# Patient Record
Sex: Female | Born: 1970 | Race: White | Hispanic: No | Marital: Single
Health system: Northeastern US, Academic
[De-identification: ages and names within clinical notes are randomized; demographics above are authoritative.]

## PROBLEM LIST (undated history)

## (undated) ENCOUNTER — Inpatient Hospital Stay: Admission: RE | Payer: No Typology Code available for payment source | Source: Ambulatory Visit

## (undated) DIAGNOSIS — J45909 Unspecified asthma, uncomplicated: Secondary | ICD-10-CM

## (undated) DIAGNOSIS — F32A Depression, unspecified: Secondary | ICD-10-CM

## (undated) DIAGNOSIS — R569 Unspecified convulsions: Secondary | ICD-10-CM

## (undated) DIAGNOSIS — F259 Schizoaffective disorder, unspecified: Secondary | ICD-10-CM

## (undated) DIAGNOSIS — F329 Major depressive disorder, single episode, unspecified: Secondary | ICD-10-CM

## (undated) DIAGNOSIS — F419 Anxiety disorder, unspecified: Secondary | ICD-10-CM

## (undated) DIAGNOSIS — F250 Schizoaffective disorder, bipolar type: Secondary | ICD-10-CM

## (undated) DIAGNOSIS — K2970 Gastritis, unspecified, without bleeding: Secondary | ICD-10-CM

## (undated) DIAGNOSIS — I5022 Chronic systolic (congestive) heart failure: Secondary | ICD-10-CM

## (undated) DIAGNOSIS — F319 Bipolar disorder, unspecified: Secondary | ICD-10-CM

## (undated) DIAGNOSIS — F1910 Other psychoactive substance abuse, uncomplicated: Secondary | ICD-10-CM

## (undated) HISTORY — PX: TUMOR REMOVAL: SHX12

## (undated) HISTORY — PX: ABDOMINAL HYSTERECTOMY: SHX81

---

## 1993-01-19 DIAGNOSIS — B1920 Unspecified viral hepatitis C without hepatic coma: Secondary | ICD-10-CM | POA: Insufficient documentation

## 2000-10-13 ENCOUNTER — Ambulatory Visit: Payer: Self-pay | Admitting: Obstetrics & Gynecology

## 2009-06-11 ENCOUNTER — Encounter (HOSPITAL_BASED_OUTPATIENT_CLINIC_OR_DEPARTMENT_OTHER): Payer: Self-pay

## 2009-06-11 LAB — CBC, PLATELET & DIFFERENTIAL
BASOPHIL %: 0.5 % (ref 0.0–2.0)
EOSINOPHIL %: 1.3 % (ref 0.0–7.0)
HEMATOCRIT: 45.5 % (ref 36.0–48.0)
HEMOGLOBIN: 15.8 g/dl (ref 12.0–16.0)
LYMPHOCYTE %: 29.8 % (ref 13.0–39.0)
MEAN CORP HGB CONC: 34.8 g/dl (ref 32.0–36.0)
MEAN CORPUSCULAR HGB: 34.6 pg — ABNORMAL HIGH (ref 27.0–33.0)
MEAN CORPUSCULAR VOL: 99.4 fl (ref 80.0–100.0)
MEAN PLATELET VOLUME: 11.1 fl — ABNORMAL HIGH (ref 6.4–10.8)
MONOCYTE %: 5.7 % (ref 1.0–12.0)
NEUTROPHIL %: 62.7 % (ref 46.0–79.0)
PLATELET COUNT: 204 10*3/uL (ref 150–400)
RBC DISTRIBUTION WIDTH: 14.4 % — ABNORMAL HIGH (ref 11.5–14.3)
RED BLOOD CELL COUNT: 4.58 M/uL (ref 4.50–5.10)
WHITE BLOOD CELL COUNT: 10 10*3/uL (ref 4.0–10.8)

## 2009-06-11 LAB — URINALYSIS
BILIRUBIN, URINE: NEGATIVE
GLUCOSE, URINE: NEGATIVE MG/DL
KETONE, URINE: NEGATIVE MG/DL
LEUKOCYTE ESTERASE: NEGATIVE
NITRITE, URINE: NEGATIVE
OCCULT BLOOD, URINE: NEGATIVE
PH URINE: 6 (ref 5.0–8.0)
PROTEIN, URINE: NEGATIVE MG/DL
SPECIFIC GRAVITY URINE: 1.02 (ref 1.003–1.035)

## 2009-06-11 LAB — COMPREHENSIVE METABOLIC PANEL
ALANINE AMINOTRANSFERASE: 42 IU/L — ABNORMAL HIGH (ref 7–35)
ALBUMIN: 3.6 g/dl (ref 3.4–4.8)
ALKALINE PHOSPHATASE: 93 IU/L (ref 25–106)
ANION GAP: 12 mmol/L (ref 2–25)
ASPARTATE AMINOTRANSFERASE: 44 IU/L — ABNORMAL HIGH (ref 8–34)
BILIRUBIN TOTAL: 0.6 mg/dl (ref 0.2–1.1)
BUN (UREA NITROGEN): 6 mg/dl (ref 6–20)
CALCIUM: 9.1 mg/dl (ref 8.6–10.3)
CARBON DIOXIDE: 28 mmol/L (ref 22–32)
CHLORIDE: 103 mmol/L (ref 101–111)
CREATININE: 0.6 mg/dl (ref 0.4–1.2)
ESTIMATED GLOMERULAR FILT RATE: >60 mL/min (ref >60)
Glucose Random: 94 mg/dl (ref 74–160)
POTASSIUM: 3.6 mmol/L (ref 3.5–5.1)
SODIUM: 143 mmol/L (ref 135–144)
TOTAL PROTEIN: 6.7 g/dl (ref 5.9–7.5)

## 2009-06-11 LAB — URINE DRUG SCREEN 7 DRUGS
AMPHETAMINES URINE: NEGATIVE
BARBITURATES URINE: NEGATIVE
BENZODIAZEPINES URINE: NEGATIVE
CANNABINOIDS URINE: POSITIVE — AB
COCAINE METABOLITES URINE: POSITIVE — AB
ETHANOL URINE: POSITIVE — AB
OPIATES URINE: NEGATIVE

## 2009-06-11 LAB — SERUM DRUG SCREEN 6 DRUGS
ACETAMINOPHEN: <10 ug/ml — ABNORMAL LOW (ref 10–30)
BARBITURATE: NEGATIVE
BENZODIAZEPINE: NEGATIVE
ETHANOL: 14 mg/dl — ABNORMAL HIGH (ref <10)
SALICYLATE: <4 mg/dl — ABNORMAL LOW (ref 4–29)
TRICYCLIC ANTIDEPRESSANTS: NEGATIVE

## 2009-06-11 LAB — HCG QUALITATIVE URINE: HCG QUALITATIVE URINE: NEGATIVE

## 2009-06-11 LAB — HOLD RED TOP TUBE

## 2009-06-11 LAB — HOLD BLUE TOP TUBE

## 2009-06-11 NOTE — ED Notes (Signed)
[place] on curb with police requesting ETOH detox. Also uses [facility] and smokes cocaine denies chest pain.  Positive for SI no plan.  No signs trauma.  Cooperative at pres and on S/W

## 2009-06-12 ENCOUNTER — Inpatient Hospital Stay (HOSPITAL_BASED_OUTPATIENT_CLINIC_OR_DEPARTMENT_OTHER)
Admission: RE | Admit: 2009-06-12 | Disposition: A | Discharge status: Home / Self Care | Payer: Self-pay | Source: Emergency Department | Attending: Emergency Medicine | Admitting: Emergency Medicine

## 2009-06-12 HISTORY — DX: Bipolar disorder, unspecified: F31.9

## 2009-06-12 HISTORY — DX: Other psychoactive substance abuse, uncomplicated: F19.10

## 2009-06-12 HISTORY — DX: Depression, unspecified: F32.A

## 2009-06-12 HISTORY — DX: Major depressive disorder, single episode, unspecified: F32.9

## 2009-06-12 HISTORY — DX: Schizoaffective disorder, unspecified: F25.9

## 2009-06-12 LAB — PSYCH ASSESSMENT

## 2009-06-14 LAB — URINALYSIS
BILIRUBIN, URINE: NEGATIVE
GLUCOSE, URINE: NEGATIVE MG/DL
KETONE, URINE: NEGATIVE MG/DL
LEUKOCYTE ESTERASE: NEGATIVE
NITRITE, URINE: NEGATIVE
OCCULT BLOOD, URINE: NEGATIVE
PH URINE: 7 (ref 5.0–8.0)
PROTEIN, URINE: NEGATIVE MG/DL
SPECIFIC GRAVITY URINE: 1.004 (ref 1.003–1.035)

## 2009-06-16 LAB — URINE CULTURE/COLONY COUNT

## 2009-06-16 LAB — EMERGENCY ROOM NOTE

## 2009-06-16 LAB — PSYCH DISCHARGE SUMMARY

## 2009-08-21 LAB — EMERGENCY ROOM NOTE

## 2011-02-07 DIAGNOSIS — J449 Chronic obstructive pulmonary disease, unspecified: Secondary | ICD-10-CM | POA: Diagnosis present

## 2011-05-31 ENCOUNTER — Encounter (HOSPITAL_BASED_OUTPATIENT_CLINIC_OR_DEPARTMENT_OTHER): Payer: Self-pay | Admitting: Emergency Medicine

## 2011-05-31 LAB — SERUM DRUG SCREEN 6 DRUGS
ACETAMINOPHEN: <10 ug/ml — ABNORMAL LOW (ref 10–30)
BARBITURATE: NEGATIVE
BENZODIAZEPINE: NEGATIVE
ETHANOL: 131 mg/dl — ABNORMAL HIGH (ref <10)
SALICYLATE: <4 mg/dl — ABNORMAL LOW (ref 4–29)
TRICYCLIC ANTIDEPRESSANTS: NEGATIVE

## 2011-05-31 LAB — COMPREHENSIVE METABOLIC PANEL
ALANINE AMINOTRANSFERASE: 31 IU/L (ref 7–35)
ALANINE AMINOTRANSFERASE: 31 IU/L (ref 7–35)
ALBUMIN: 3.2 g/dl — ABNORMAL LOW (ref 3.4–4.8)
ALBUMIN: 3.2 g/dl — ABNORMAL LOW (ref 3.4–4.8)
ALKALINE PHOSPHATASE: 114 IU/L — ABNORMAL HIGH (ref 25–106)
ALKALINE PHOSPHATASE: 109 IU/L — ABNORMAL HIGH (ref 25–106)
ANION GAP: 13 mmol/L (ref 2–25)
ANION GAP: 10 mmol/L (ref 2–25)
ASPARTATE AMINOTRANSFERASE: 34 IU/L (ref 8–34)
ASPARTATE AMINOTRANSFERASE: 32 IU/L (ref 8–34)
BILIRUBIN TOTAL: 0.4 mg/dl (ref 0.2–1.1)
BILIRUBIN TOTAL: 0.3 mg/dl (ref 0.2–1.1)
BUN (UREA NITROGEN): 4 mg/dl — ABNORMAL LOW (ref 6–20)
BUN (UREA NITROGEN): 4 mg/dl — ABNORMAL LOW (ref 6–20)
CALCIUM: 8.7 mg/dl (ref 8.6–10.3)
CALCIUM: 8.7 mg/dl (ref 8.6–10.3)
CARBON DIOXIDE: 23 mmol/L (ref 22–32)
CARBON DIOXIDE: 26 mmol/L (ref 22–32)
CHLORIDE: 104 mmol/L (ref 101–111)
CHLORIDE: 104 mmol/L (ref 101–111)
CREATININE: 0.6 mg/dl (ref 0.4–1.2)
CREATININE: 0.6 mg/dl (ref 0.4–1.2)
ESTIMATED GLOMERULAR FILT RATE: >60 mL/min (ref >60)
ESTIMATED GLOMERULAR FILT RATE: >60 mL/min (ref >60)
Glucose Random: 94 mg/dl (ref 74–160)
Glucose Random: 88 mg/dl (ref 74–160)
POTASSIUM: 2.9 mmol/L — ABNORMAL LOW (ref 3.5–5.1)
POTASSIUM: 3.1 mmol/L — ABNORMAL LOW (ref 3.5–5.1)
SODIUM: 140 mmol/L (ref 135–144)
SODIUM: 140 mmol/L (ref 135–144)
TOTAL PROTEIN: 6.2 g/dl (ref 5.9–7.5)
TOTAL PROTEIN: 6 g/dl (ref 5.9–7.5)

## 2011-05-31 LAB — CBC, PLATELET & DIFFERENTIAL
BASOPHIL %: 0.2 % (ref 0.0–2.0)
EOSINOPHIL %: 1.3 % (ref 0.0–7.0)
HEMATOCRIT: 43.9 % (ref 36.0–48.0)
HEMOGLOBIN: 14.7 g/dl (ref 12.0–16.0)
LYMPHOCYTE %: 43.2 % — ABNORMAL HIGH (ref 13.0–39.0)
MEAN CORP HGB CONC: 33.5 g/dl (ref 32.0–36.0)
MEAN CORPUSCULAR HGB: 34.7 pg — ABNORMAL HIGH (ref 27.0–33.0)
MEAN CORPUSCULAR VOL: 103.4 fl — ABNORMAL HIGH (ref 80.0–100.0)
MEAN PLATELET VOLUME: 10.6 fl (ref 6.4–10.8)
MONOCYTE %: 6.4 % (ref 1.0–12.0)
NEUTROPHIL %: 48.9 % (ref 46.0–79.0)
PLATELET COUNT: 206 10*3/uL (ref 150–400)
RBC DISTRIBUTION WIDTH: 15.2 % — ABNORMAL HIGH (ref 11.5–14.3)
RED BLOOD CELL COUNT: 4.24 M/uL — ABNORMAL LOW (ref 4.50–5.10)
WHITE BLOOD CELL COUNT: 9.1 10*3/uL (ref 4.0–10.8)

## 2011-05-31 LAB — URINE PREGNANCY TEST (POINT OF CARE): HCG QUALITATIVE URINE: NEGATIVE

## 2011-05-31 LAB — URINE DIP (POINT OF CARE)
BILIRUBIN, URINE: NEGATIVE
KETONE, URINE: 1.02 mg/dl
LEUKOCYTE ESTERASE: NEGATIVE
NITRITE, URINE: NEGATIVE
OCCULT BLOOD, URINE: NEGATIVE
PH URINE: 6 (ref 5.0–8.0)
SPECIFIC GRAVITY URINE: NEGATIVE (ref 1.003–1.030)
UROBILINOGEN URINE: 1 mg/dl (ref 0.2–1.0)

## 2011-05-31 LAB — D-DIMER PE/DVT, QUANTITATIVE: D-DIMER PE/DVT, QUANTITATIVE: 0.35 mg/L FEU (ref 0.00–0.49)

## 2011-05-31 LAB — TROPONIN I: TROPONIN I: 0.02 ng/mL (ref 0.00–0.04)

## 2011-05-31 LAB — HOLD RED TOP TUBE

## 2011-05-31 LAB — HOLD GREEN TOP TUBE

## 2011-05-31 LAB — HOLD PURPLE TOP TUBE

## 2011-05-31 LAB — XR CHEST 2 VIEWS

## 2011-05-31 MED ORDER — SODIUM CHLORIDE 0.9 % IV BOLUS
1000.00 mL | Freq: Once | INTRAVENOUS | Status: AC
Start: 2011-05-31 — End: 2011-05-31
  Administered 2011-05-31: 1000 mL via INTRAVENOUS

## 2011-05-31 MED ORDER — LET SOLUTION
5.00 mL | Freq: Once | Status: AC
Start: 2011-05-31 — End: 2011-05-31
  Administered 2011-05-31: 5 mL via TOPICAL

## 2011-05-31 MED ORDER — POTASSIUM CHLORIDE 20 MEQ OR TBCR
40.00 meq | EXTENDED_RELEASE_TABLET | Freq: Once | ORAL | Status: AC
Start: 2011-05-31 — End: 2011-05-31
  Administered 2011-05-31: 40 meq via ORAL
  Filled 2011-05-31: qty 2

## 2011-05-31 MED ORDER — LET SOLUTION
Status: AC
Start: 2011-05-31 — End: 2011-06-01
  Filled 2011-05-31: qty 5

## 2011-05-31 MED ORDER — ONDANSETRON HCL 4 MG/2ML IJ SOLN
4.00 mg | Freq: Once | INTRAMUSCULAR | Status: AC
Start: 2011-05-31 — End: 2011-05-31
  Administered 2011-05-31: 4 mg via INTRAVENOUS
  Filled 2011-05-31: qty 2

## 2011-05-31 MED ORDER — SULFAMETHOXAZOLE-TMP DS 800-160 MG PO TABS
1.00 | ORAL_TABLET | Freq: Once | ORAL | Status: AC
Start: 2011-05-31 — End: 2011-05-31
  Administered 2011-05-31: 1 via ORAL
  Filled 2011-05-31: qty 1

## 2011-05-31 NOTE — ED Nursing Notes (Signed)
1510- IV # 22 placed in the right hand on 3rd attempt in aseptic technique.  Labs drawn and sent.  12 lead ECG given to Dr. Rosetta Posner.    Jennell Corner, RN, CEN  Emergency Medicine
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
## 2011-05-31 NOTE — ED Provider Notes (Addendum)
The patient was seen by me and Dr. Max Fickle. ED nursing record was reviewed. Prior records as available electronically through the Epic record were reviewed.      HPI:    This 40 year old female patient, with significant psychotic history and history of chronic ETOH abuse, presents to the Emergency Department with chief complaint of "hearing voices" for a few days which tell her to "hurt herself" and "hurt others." Patient denies any plan and voices are non-specific. Patient also has pain in left arm x 3 days which started when she noticed a "red bump" near the elbow. Associated symptoms to arm pain include numbness, tingling, and weakness. Patient also complains of SOB x 4 days. SOB is intermittent and non-exertional. Associated with non-productive cough. Patient also complains of CP x 1 day described as sharp and localized to left chest wall. Pain is non-radiating, non exertional. No associated nausea, vomiting, diaphoresis. Patient also states she is "in withdrawal." She states that she has been vomiting and has "chronic diarrhea." She states she had "a little tp drink today." Denies fever or chills. No abdominal pain. No urinary complaints. No recent use of cocaine. Only other substance in addition to ETOH use is tobacco use and marijuana.      ROS: Pertinent positives were reviewed as per the HPI above. All other systems were reviewed and are negative.      Past Medical History/Problem list:    Past Medical History    Bipolar affective schizo    Schizo affective schizophrenia depress    Depression     Substance abuse        There is no problem list on file for this patient.              Past Surgical History: History reviewed.  No pertinent past surgical history.      Medications:   No current facility-administered medications on file prior to encounter.  No current outpatient prescriptions on file prior to encounter.      Social History:   Social History   Marital Status: Divorced  Spouse Name: N/A    Years of  Education: N/A  Number of Children: N/A     Occupational History  None on file     Social History Main Topics   Smoking status: Current Everyday Smoker  1.0 Packs/Day     Smokeless tobacco:     Alcohol Use: Yes    Drug Use: Yes     Special: Marijuana    Sexually Active: Yes  Partner(s): Female     Other Topics Concern   None on file     Social History Narrative   None on file           Allergies:  Review of Patient's Allergies indicates:   Bee                        Tegretol                Hives      Physical Exam:  BP 140/88   Pulse 106   Temp 98 F   Resp 18   SpO2 96%    GENERAL: No acute distress, non-toxic.   SKIN:  Erythematous, fluctuant mass with overlying scab over proximal forearm. No surrounding erythema or induration. Warm & Dry, no rash.  HEAD:  NCAT. Sclerae are anicteric and aninjected, oropharynx is clear with dry mucous membranes. PERRL.  LUNGS:  Clear to  auscultation bilaterally. No wheezes, rales, rhonchi.   HEART:  RRR.  No murmurs, rubs, or gallops.   ABDOMEN:  Soft, NTND.  No masses.  No involuntary guarding or rebound.   EXTREMITIES:  See skin. No obvious deformities.  CSM intact x 4.   .   NEUROLOGIC:  Neurologic exam grossly intact.  PSYCHIATRIC:  Appropriate for age, time of day, and situation        ED Course and Medical Decision-making:  This 40 year old female patient arrives to ED with multiple complaints as noted in HPI. Given fact that patient is hearing voices which tell her to hurt herself and others, [facility] immediately notified of this case. Labs ordered to include CBC, CMP, Urine tox, Serum tox, Urine dip and Urine HCG. Given that CP has been going for 1 day, EKG and troponin ordered, though ACS less likely. EKG showed NSR, rate 86, QT 404, QTc 483, no ST/T wave ischemic changes. Given SOB and cough, CXR ordered to look for possible pneumonia. CXR was negative and showed no acute cardiopulmonary process. Patient tachycardic at 106 upon arrival, thus D-dimer added on. If possible  will consider CTA to rule out PE, however this is less likely since patient has not been immobile and no calf pain. Erythematous, fluctuant mass distal to left elbow consistent with abscess, however patient was very anxious and due to safety concerns with administering lidocaine or using scalpel on this patient, LET solution applied to see if this would help in dissolving superficial scab and expressing any purulent material. LET solution applied and nothing occurred. This will be re-evaluated later. Only lab resulted is Troponin, which was WNL. Lab notified. Apparently labs hemolyzed but no one notified to reorder labs again. CBC, Serum drug screen, CMP, D-dimer pending. Patient refusing and becoming verbally aggressive to staff. RN to make another attempt at drawing blood. If this is not successful, Psych will be notified regarding this.       Condition: pending  Disposition: pending      Diagnosis/Diagnoses:  pending    Patient signed out to Dr. Max Fickle at 5:55pm    Sonda Rumble PA-C    Attending note  Seen with PA, agree with exam and evaluation    The patient is a 40 year old female who is coming in with complaints that she's been hearing voices recently as well as several medical complaints.  She states she's been having chest pain and shortness of breath, chest pain is nonradiating which is pleuritic.  She states chest is more for one day denies any use of cocaine.  No history similar chest pain in the past.  The patient also states that she has left elbow pain from a lesion that has been growing over the past several days.  She denies any drug ingestions falls or injuries to the area.  With regard to the voices, she states that the voices are telling her to hurt other people.  She denies any suicide ideation.  She denies a history of hearing these voices in the past, she states she this has just started over the last several days.  She also admits drinking alcohol, last drink this morning.  Denies any drug  use.  The patient denies any chest pain at this time Exam reveals a easily agitated female, does not appear in any acute distress.  Breath sounds equal bilaterally, no rales rhonchi or wheezing.  Chest wall is nontender.  Left elbow with a 3-4 centimeter area of induration with  a 1 centimeter area of fluctuance that does appear that the area has nearly come to a head, though no drainage at this time.  She has multiple medical complaints on top of her psychiatric complaints, she was placed on a safety watch.  We obtained labs including cardiac enzymes d-dimer and an electrocardiogram.  Her TIMI score is 0 which places her at low risk, as well as her electrocardiogram which is no acute injury pattern, no ST abnormalities or T-wave inversions.  The troponin on the labs were negative.  I do not feel that given these findings at the chest pain is of cardiac etiology.  The d-dimer also was negative, which makes PE unlikely as she does not have any risk factors as well.  With regard to the abscess, after a great deal of talking with the patient was able to calm her down to the point where I could inject lidocaine over the area and did I&D over the area of maximal fluctuance which produced a significant amount of pus drainage.  I proceeded to palpate around the area of induration in an attempt to break up any loculations.  The patient did not tolerate this procedure well, halfway through she began to flail and would not tolerate further intervention, she refused any further treatment or work on the area.  A dressing was subsequently applied.  I give the patient a dose of Bactrim to cover for the abscess given that I do not feel it was 100% successful I&D.  The rest of her labs returned, no significant abnormality to warrant medical intervention.  I discussed with psychiatry that she was medically cleared, and they are coming to evaluate the patient.  I will sign this patient out to Dr. Venetia Maxon

## 2011-05-31 NOTE — ED Notes (Signed)
Complains of nausea, vomited x2 MD aware.

## 2011-05-31 NOTE — ED Notes (Signed)
Ambulated to BR able to give urine sample.

## 2011-05-31 NOTE — ED Nursing Notes (Signed)
1735- Labs are hemolyzed again.      Jennell Corner, RN, CEN  Emergency Medicine

## 2011-05-31 NOTE — ED Notes (Signed)
Patient is resting comfortably. 

## 2011-05-31 NOTE — ED Triage Note (Signed)
I'm hearing voices telling me to hurt people.My chest hurts for one day since my arm fot infected.I want detox.Crying female with abscess to left upper forearm.

## 2011-05-31 NOTE — ED Notes (Signed)
Patient is resting comfortably. No further episodes of vomiting.

## 2011-06-01 ENCOUNTER — Inpatient Hospital Stay (HOSPITAL_BASED_OUTPATIENT_CLINIC_OR_DEPARTMENT_OTHER)
Admission: RE | Admit: 2011-06-01 | Disposition: A | Discharge status: Other Acute Inpatient Hospital | Payer: Self-pay | Source: Emergency Department | Attending: Psychiatry | Admitting: Psychiatry

## 2011-06-01 ENCOUNTER — Encounter (HOSPITAL_BASED_OUTPATIENT_CLINIC_OR_DEPARTMENT_OTHER): Payer: Self-pay | Admitting: Psychiatry

## 2011-06-01 LAB — EKG

## 2011-06-01 MED ORDER — QUETIAPINE FUMARATE 200 MG PO TABS
200.00 mg | ORAL_TABLET | Freq: Once | ORAL | Status: AC
Start: 2011-06-01 — End: 2011-06-01
  Administered 2011-06-01: 200 mg via ORAL
  Filled 2011-06-01 (×2): qty 1

## 2011-06-01 MED ORDER — NICOTINE 21 MG/24HR TD PT24
1.0000 | MEDICATED_PATCH | Freq: Every day | TRANSDERMAL | Status: DC
Start: 2011-06-01 — End: 2011-06-02
  Administered 2011-06-02: 1 via TRANSDERMAL
  Filled 2011-06-01: qty 1

## 2011-06-01 MED ORDER — HYDROXYZINE HCL 25 MG PO TABS
25.0000 mg | ORAL_TABLET | Freq: Two times a day (BID) | ORAL | Status: DC | PRN
Start: 2011-06-01 — End: 2011-06-02
  Administered 2011-06-02: 25 mg via ORAL
  Filled 2011-06-01 (×2): qty 1

## 2011-06-01 MED ORDER — SULFAMETHOXAZOLE-TMP DS 800-160 MG PO TABS
1.00 | ORAL_TABLET | Freq: Once | ORAL | Status: AC
Start: 2011-06-01 — End: 2011-06-01
  Administered 2011-06-01: 1 via ORAL
  Filled 2011-06-01: qty 1

## 2011-06-01 MED ORDER — OXAZEPAM 15 MG PO CAPS
15.0000 mg | ORAL_CAPSULE | ORAL | Status: DC
Start: 2011-06-02 — End: 2011-06-02
  Administered 2011-06-02 (×2): 15 mg via ORAL
  Filled 2011-06-01 (×6): qty 1

## 2011-06-01 MED ORDER — LEVETIRACETAM 500 MG PO TABS
500.0000 mg | ORAL_TABLET | Freq: Two times a day (BID) | ORAL | Status: DC
Start: 2011-06-01 — End: 2011-06-02
  Administered 2011-06-01 – 2011-06-02 (×2): 500 mg via ORAL
  Filled 2011-06-01 (×4): qty 1

## 2011-06-01 MED ORDER — OXAZEPAM 10 MG PO CAPS
10.0000 mg | ORAL_CAPSULE | Freq: Four times a day (QID) | ORAL | Status: DC
Start: 2011-06-04 — End: 2011-06-02

## 2011-06-01 MED ORDER — CHLORPROMAZINE HCL 100 MG PO TABS
100.0000 mg | ORAL_TABLET | ORAL | Status: DC | PRN
Start: 2011-06-01 — End: 2011-06-02
  Filled 2011-06-01 (×2): qty 1

## 2011-06-01 MED ORDER — BACITRACIN 500 UNIT/GM EX OINT
TOPICAL_OINTMENT | Freq: Every day | CUTANEOUS | Status: DC
Start: 2011-06-01 — End: 2011-06-02
  Administered 2011-06-01: 17:00:00 via TOPICAL
  Filled 2011-06-01: qty 30

## 2011-06-01 MED ORDER — OXAZEPAM 15 MG PO CAPS
15.0000 mg | ORAL_CAPSULE | Freq: Four times a day (QID) | ORAL | Status: DC
Start: 2011-06-03 — End: 2011-06-02
  Filled 2011-06-01: qty 1

## 2011-06-01 MED ORDER — OXAZEPAM 30 MG PO CAPS
30.0000 mg | ORAL_CAPSULE | ORAL | Status: AC
Start: 2011-06-01 — End: 2011-06-02
  Administered 2011-06-01 – 2011-06-02 (×3): 30 mg via ORAL
  Filled 2011-06-01 (×5): qty 1

## 2011-06-01 MED ORDER — LEVETIRACETAM 500 MG PO TABS
500.00 mg | ORAL_TABLET | Freq: Once | ORAL | Status: AC
Start: 2011-06-01 — End: 2011-06-01
  Administered 2011-06-01: 500 mg via ORAL
  Filled 2011-06-01: qty 1

## 2011-06-01 MED ORDER — ACETAMINOPHEN 325 MG PO TABS
650.00 mg | ORAL_TABLET | Freq: Once | ORAL | Status: AC
Start: 2011-06-01 — End: 2011-06-01
  Administered 2011-06-01: 650 mg via ORAL
  Filled 2011-06-01: qty 2

## 2011-06-01 MED ORDER — LOPERAMIDE HCL 2 MG PO CAPS
2.00 mg | ORAL_CAPSULE | Freq: Once | ORAL | Status: AC
Start: 2011-06-01 — End: 2011-06-01
  Administered 2011-06-01: 2 mg via ORAL
  Filled 2011-06-01: qty 1

## 2011-06-01 MED ORDER — NICOTINE 21 MG/24HR TD PT24
1.00 | MEDICATED_PATCH | Freq: Once | TRANSDERMAL | Status: AC
Start: 2011-06-01 — End: 2011-06-01
  Administered 2011-06-01: 1 via TRANSDERMAL
  Filled 2011-06-01: qty 1

## 2011-06-01 MED ORDER — LORAZEPAM 2 MG/ML IJ SOLN
2.00 mg | Freq: Once | INTRAMUSCULAR | Status: AC
Start: 2011-06-01 — End: 2011-06-01
  Administered 2011-06-01: 2 mg via INTRAVENOUS
  Filled 2011-06-01: qty 1

## 2011-06-01 MED ORDER — LORAZEPAM 1 MG PO TABS
2.00 mg | ORAL_TABLET | Freq: Once | ORAL | Status: AC
Start: 2011-06-01 — End: 2011-06-01
  Administered 2011-06-01: 2 mg via ORAL
  Filled 2011-06-01: qty 2

## 2011-06-01 MED ORDER — QUETIAPINE FUMARATE 100 MG PO TABS
200.00 mg | ORAL_TABLET | Freq: Once | ORAL | Status: AC
Start: 2011-06-01 — End: 2011-06-01
  Administered 2011-06-01: 200 mg via ORAL
  Filled 2011-06-01: qty 2

## 2011-06-01 MED ORDER — OXAZEPAM 15 MG PO CAPS
15.0000 mg | ORAL_CAPSULE | ORAL | Status: DC | PRN
Start: 2011-06-01 — End: 2011-06-02
  Filled 2011-06-01: qty 1

## 2011-06-01 NOTE — ED Notes (Signed)
VSS report called to [name] Bactrim DS/ Ativan 2mg  administerednurse to nurse complete

## 2011-06-01 NOTE — Progress Notes (Signed)
Pt refused orthostatic VS, only accepted VS while lying down. Initially refused meds, able to accept later.  Angry and irritable on approach. Ate half her meal. Sleeping soundly @ 7 pm.

## 2011-06-01 NOTE — H&P (Signed)
Patient's psychiatric and medical history including labs were reviewed.   Patient has been medically cleared by the ED, of note the patient will need daily wound checks and dressing changes along with topical bactrim as well as PO bactrim as ordered. Patient is currently afebrile, with tenderness to palpation over abscess area.

## 2011-06-01 NOTE — ED Notes (Signed)
Pt assessed by psychiatry.  Denies pain.  Resting comfortably

## 2011-06-01 NOTE — Progress Notes (Signed)
In bed, snoring loudly, all evening. + meds with repeated requests; afebrile. Dressing applied to left arm post med. Urine pending, blood work pending for AM. Angry, wants phone when awake.

## 2011-06-01 NOTE — PES NOTES (Signed)
06/01/11 0130  Per [facility] Pt was hospitalized at [facility] and discharged 04/3011. She also does have an Intensive Case Manager at [facility]  [phone].  [email or web address]

## 2011-06-01 NOTE — ED Provider Notes (Signed)
Addendum: I received signout on this patient from Dr. Max Fickle.  I reviewed the records and personally saw the patient.  She has been resting comfortably, she was seen by psychiatry and agree that she needs psychiatric admission, they will arrange bed placement for her.  She agrees with this plan.  At roughly 00:50 the patient noted that she felt somewhat tremulous, she did not have any seizure activity, but 2 milligrams of Ativan was given for withdrawal prophylaxis and also given that she does have a seizure disorder and it was still unsure what her antiepileptics are.  Psychiatry was shortly thereafter able to confirm that she is on Keppra 500 milligrams twice a day, so her evening dose was given here.  Regarding the management of her abscess was drained here, she will have daily wound checks and dressing changes with bacitracin apply topically once a day, she will continue Bactrim double strength one tablet by mouth every 12 hours for 7 days.    Lorenso Courier MD
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
## 2011-06-01 NOTE — Initial Assessments (Signed)
PSYCH ADMISSION NOTE - Blue Team    Identifying Data:   as noted in [name]- Patient is a 40 year old female with a history of alcohol abuse and dependence, mood d/o vs. Schizoaffective disorder by history, PTSD by history, seizure disorder, who presented initially to the MED ED complaining of AH telling her to harm others, and multiple physical complaints including left elbow pain from an abscess.   Source of Information:   Patient, MED ED staff, review of discharge summary from 2010   Chief Complaint:    As reported in [name]-"I've been hearing voices and I'm off all my meds"   History of Present Illness (include acute symptoms, precipitants, changes in functional status, and level of distress):   As noted initially in [name] - Patient is a 40 year old female with a history of alcohol abuse and dependence, mood d/o vs. Schizoaffective disorder by history, PTSD by history, seizure disorder who presented initially to the MED ED complaining of AH telling her to harm others, and multiple physical complaints including left elbow pain from an abscess. History is somewhat limited by the patient's discomfort at pain in her arm and reluctance to discuss some aspects of her history. Nonetheless, she states that she was discharged from a psyciatric hospital one week ago (she does not recall which). Immediately after discharge she relapsed into drinking alcohol, with ingestion of 1.5 liters of vodka daily at present. She also claims her psychiatric medications at discharge, including Seroquel, Thorazine, Vistaril and Keppra were stolen from her, and therefore she has been off all her medications. She has also been homeless and living on the streets.   Due to the above she has felt worsening depression, as well as had racing thoughts, and she cannot separate depressed or manic symptoms from each other. She is able to say that she has had worsening AH over the past week, of multiple voices telling her to hurt other people by cutting  their throats. She has no desire or intent to harm others, but is concerned about these thoughts. She denies any VH. She denies paranoia or IOR. She feels she needs to be in the hospital to recompensate and restart her medications. She has had a drainage of her left arm abscess and accepted antibiotics in the MED ED, with plans to continue after inpatient admission. She says she last suffered a seizure several days ago, but cannot be sure whether this was related to alcohol use or being off Keppra. She asks for Ativan for tremulousness while she awaits her disposition, saying that she has a history of both seizures and DTs.   On the unit 06/01/11 she is in bed and declines meeting other then limited bedside interaction.  She does not disagree with restarting medications and getting detoxification on the unit at present. Encouraged to meet with full team in AM tomorrow.  Active Medical Problems:   Left elbow abscess s/p incision and drainage, history of seizure disorder.  Patient seen in the medical ED and cleared for admission by Dr. Venetia Maxon, who has restarted the patient's Keppra, and provides the following instructions for further care:   Continue Keppra 500mg  PO BID   Bactrim DS 1 tablet PO Q12 hours   Daily dry dressing of left elbow with bacitracin ointment.  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 Schizoaffective disorder by history, PTSD by history, Alcohol abuse and dependence, polysubstance abuse by history   History of Psychiatric Hospitalizations (include dates and locations):  Multiple, including stated discharge 1 week ago from  hospital, last [facility] discharge Sept 2010   Current/Most Recent Outpatient Care: She cannot recall, and says she has no current treaters   Safety Concerns: Active HI and AH as noted above in HPI   Medication/Treatment Trials: the patient states that she gets her medications at [facility], and in [name] was confirmed the following current med list:   Seroquel 200mg  PO BID    Vistaril 25mg  PO BID PRN anxiety   Thorazine 100mg  PO Q4 hours PRN agitation   Keppra 500mg  PO BID    Per [facility] Pt was hospitalized at [facility] and discharged 05/19/11. She also does have an Intensive Case Manager at [facility] [phone].  Family History:   History reviewed. No pertinent family history.   Family History of Mental Illness or Substance Abuse:Yes, says she has long history of family illness which she cannot recall.   Substance Abuse:   Substance Abuse Screen   * Used chemicals (other than as prescribed) in the past 30 days?: No   History of Substance Abuse Related Symptoms: Blackouts;Chills;CP/palpitations;Delirium tremors;Elevated BP;Diaphoresis;Seizures;Tremulousness;Hallucinations   Alcohol   * Alcohol Use: Yes   Alcohol Amount: 1.5 liters of vodka   Alcohol Frequency: Daily   Alcohol Last Used: Day of presentation   Alcohol Longest Period of Sobriety: Unknown   Alcohol Most Recent Sobriety: Approximcately one year ago   History of Blackouts: Yes   History of Hallucinations: Yes     Trauma History:   Abuse/Trauma History: None     Pertinent Past Medical/Surgical History:   Past Medical History       Bipolar affective  schizo       Schizo affective schizophrenia  depress     Depression      Substance abuse     History reviewed. No pertinent past surgical history.   Allergies   Review of Patient's Allergies indicates:   Bee   Tegretol Hives   Pre-Admission Medications:   Prior to Admission Medications    Medication  Last Dose  Informant  Patient Reported?  Taking?    quetiapine (SEROQUEL) 200 MG tablet  Past Month   Yes  Yes    Take 200 mg by mouth 3 (three) times daily.    hydrOXYzine (VISTARIL) 50 MG capsule  Past Month   Yes  Yes    Take 50 mg by mouth every 4 (four) hours as needed.    chlorproMAZINE (THORAZINE) 100 MG tablet  Past Month   Yes  Yes    Take 100 mg by mouth 3 (three) times daily.    levetiracetam (KEPPRA) 500 MG tablet  Past Month   Yes  Yes    Take 500 mg by  mouth 2 (two) times daily.      CURRENT SCHEDULED MEDICATIONS:        lorazepam  2 mg Intravenous Once    levetiracetam  500 mg Oral Once    sulfamethoxazole-trimethoprim  1 tablet Oral Once    lorazepam  2 mg Oral Once    nicotine  1 patch Transdermal Once    acetaminophen  650 mg Oral Once    QUEtiapine  200 mg Oral Once    loperamide  2 mg Oral Once    levetiracetam  500 mg Oral BID    nicotine  1 patch Transdermal Daily    bacitracin   Topical Daily    QUEtiapine  200 mg Oral Once    oxazepam  30 mg Oral Q4H    oxazepam  15 mg Oral Q4H    oxazepam  15 mg Oral Q6H    oxazepam  10 mg Oral Q6H    sulfamethoxazole-trimethoprim  1 tablet Oral Once    potassium chloride  40 mEq Oral Once    ondansetron  4 mg Intravenous Once    sodium chloride  1,000 mL Intravenous Once       CURRENT PRN MEDICATIONS:       chlorproMAZINE 100 mg Q4H PRN   hydrOXYzine 25 mg BID PRN   oxazepam 15 mg Q2H PRN       Psychosocial History:   Psychosocial   Military: No   Copywriter, advertising for Someone: No   Providing self care at home?: No   Risk Assessment (as noted in [name]):   Suicide Risk Assessment   * Suicidal Ideation?: No   Suicide Attempt?: Yes   Suicide Attempt (Current): No   Suicide Attempt (History): Yes   Suicide Attempt Means (History): Has tried to overdose on her medications in the past   Family Suicide Attempt?: Yes   Description:: Grandfather commited suicide   Family Suicide (actual): Yes   Description:: Grandfather commited suicide   Violence Risk   * History of Violence?: (Unknown)   Current Attempt to Harm: No   Homicidal Ideation?: Yes   Details: AH telling her to hurt other people, slit her throat   Homicidal Ideation With Intent?: No   Access to Weapons: Denies   Self Destruction Behaviors: Denies   Self Inflicted Injury?: Denies   Aggression/Poor Impulse Control?: Yes   Aggression/Poor Impulse Control (Current): No   Aggression/Poor Impulse Control (History): Yes   Aggression/Poor Impulse Control  Description (History): Cannot be clear on details   Medical Conditions Restraint Risk: Yes (comment) (Has wound on arm for which he received I&D)   Abuse History Restraint Risk: No   Current Outpatient Treatment:   Current OP Treatment   OP Provider: None   Agency Involvement: Intensive Case Manager at [facility] [phone].  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 Schizoaffective disorder by history, r/o Substance-induced mood disorder, alcohol abuse and dependence   AXIS II:: Deferred   AXIS III:: Left arm abscess s/p drainage   AXIS IV:: economic problems;housing problems;problems related to social environment;problems with access to health care services;problems with primary support group   Axis V (GAF): 35   Axis V: Estimated best in past year: 50   Case Formulation   Assessment/Formulation (As noted in [name]):   Patient is a 40 year old female with a history of alcohol abuse and dependence, mood d/o vs. Schizoaffective disorder by history, PTSD by history, seizure disorder, who presented initially to the MED ED complaining of AH telling her to harm others, and multiple physical complaints including left elbow pain from an abscess. Her current presentation is concerning for a combination of mood symptoms which may well be related to her heavy alcohol abuse and dependence, or due to underlying psychiatric illness which may benefit from further diagnostic work in the context of sobriety. In addition, her psychotic symptoms are concerning and may be related to substance use or underlying illness. She does present a safety risk given her active AH telling her to harm other people, and given this safety concern in association with her mood, psychosis and substance-related symptoms she will benefit from inpatient level of care for safety, containment and management of her mental illness.  Treatment Recommendations/Rationale for Recommended Level of Care:   Inpatient level of care for safety,  containment and management of mental illness.  Continue Antibiotic for abcess  Supervised sharps; lace restriction  Serax Detox  Resume prior psychiatric medication  Continue safety evaluation        I have reviewed all above information with patient.

## 2011-06-01 NOTE — Progress Notes (Signed)
RN Admission Note:   40 Yo Homeless Woman (living on the Streets) h/o ETOH abuse/dependence, Mood D/O vs Schizoaffective D/O, PTSD by history, admitted for increase AH telling to harm others by cutting their throats. Pt has No   Presented to [facility] w/ multiple Physical complaints including left elbow pain from abcess. Has Medical h/o of Sx Disorder (on [place]). Pt Stopped all her prescribed medication (stating they were stolen on the streets).    Pt had a recent inpatient admission but relapsed on Etoh (1.5 liters of Vodka) shortly after discharge. Pts been drinking Daily & was placed on Serax Taper on Admission. Pt was Admitted to [facility] at 3pm, admitted on Sect 12, Refusing to answer any questions or accept any treatment. Pt appearing drowsy, likely from medication given in the [name]. Initially refusing Vital Signs & Serax but later agreed. Pt Rec'd Serax 30mg  po, Seroquel at 6:15p, given at the bedside. Vital Sign lying down 121/84 hr 93. Refused sitting & standing. Pt also declined Blood Draw (BMP, CBC)...& Urine sample for Analysis.

## 2011-06-01 NOTE — PES NOTES (Signed)
06/01/11 noon Pt T MedER very irritable, paranoid, demanding and c/o elbow pain . VS elevated. Given Seroquel 200mg  for racing thoughts, +AH ; Nicoderm Patch and Tylenol 650mg  for elbow pain.  She then c/o several bouts of diarrhea and given Imodium 2mg .  Will monitor; now seems calmer and having some pain relief.  ELyonsRNCS

## 2011-06-02 ENCOUNTER — Inpatient Hospital Stay (HOSPITAL_BASED_OUTPATIENT_CLINIC_OR_DEPARTMENT_OTHER)
Admission: RE | Admit: 2011-06-02 | Disposition: A | Discharge status: Other Acute Inpatient Hospital | Payer: Self-pay | Source: Emergency Department | Attending: Student in an Organized Health Care Education/Training Program | Admitting: Student in an Organized Health Care Education/Training Program

## 2011-06-02 DIAGNOSIS — R079 Chest pain, unspecified: Secondary | ICD-10-CM | POA: Insufficient documentation

## 2011-06-02 DIAGNOSIS — F99 Mental disorder, not otherwise specified: Secondary | ICD-10-CM | POA: Insufficient documentation

## 2011-06-02 DIAGNOSIS — R0609 Other forms of dyspnea: Secondary | ICD-10-CM | POA: Insufficient documentation

## 2011-06-02 DIAGNOSIS — R0602 Shortness of breath: Secondary | ICD-10-CM | POA: Insufficient documentation

## 2011-06-02 DIAGNOSIS — F10239 Alcohol dependence with withdrawal, unspecified: Secondary | ICD-10-CM

## 2011-06-02 DIAGNOSIS — F10939 Alcohol use, unspecified with withdrawal, unspecified: Secondary | ICD-10-CM | POA: Insufficient documentation

## 2011-06-02 LAB — CBC, PLATELET & DIFFERENTIAL
BASOPHIL %: 0.4 % (ref 0.0–2.0)
BASOPHIL %: 0.5 % (ref 0.0–2.0)
EOSINOPHIL %: 1.6 % (ref 0.0–7.0)
EOSINOPHIL %: 1.6 % (ref 0.0–7.0)
HEMATOCRIT: 49.7 % — ABNORMAL HIGH (ref 36.0–48.0)
HEMATOCRIT: 46.9 % (ref 36.0–48.0)
HEMOGLOBIN: 16.5 g/dl — ABNORMAL HIGH (ref 12.0–16.0)
HEMOGLOBIN: 15.5 g/dl (ref 12.0–16.0)
LYMPHOCYTE %: 32.7 % (ref 13.0–39.0)
LYMPHOCYTE %: 37 % (ref 13.0–39.0)
MEAN CORP HGB CONC: 33.2 g/dl (ref 32.0–36.0)
MEAN CORP HGB CONC: 33.1 g/dl (ref 32.0–36.0)
MEAN CORPUSCULAR HGB: 34.6 pg — ABNORMAL HIGH (ref 27.0–33.0)
MEAN CORPUSCULAR HGB: 34.5 pg — ABNORMAL HIGH (ref 27.0–33.0)
MEAN CORPUSCULAR VOL: 104.2 fl — ABNORMAL HIGH (ref 80.0–100.0)
MEAN CORPUSCULAR VOL: 104.4 fl — ABNORMAL HIGH (ref 80.0–100.0)
MEAN PLATELET VOLUME: 11.3 fl — ABNORMAL HIGH (ref 6.4–10.8)
MEAN PLATELET VOLUME: 10.6 fl (ref 6.4–10.8)
MONOCYTE %: 4.9 % (ref 1.0–12.0)
MONOCYTE %: 5 % (ref 1.0–12.0)
NEUTROPHIL %: 60.4 % (ref 46.0–79.0)
NEUTROPHIL %: 55.9 % (ref 46.0–79.0)
PLATELET COUNT: 211 10*3/uL (ref 150–400)
PLATELET COUNT: 194 10*3/uL (ref 150–400)
RBC DISTRIBUTION WIDTH: 15.4 % — ABNORMAL HIGH (ref 11.5–14.3)
RBC DISTRIBUTION WIDTH: 15.1 % — ABNORMAL HIGH (ref 11.5–14.3)
RED BLOOD CELL COUNT: 4.76 M/uL (ref 4.50–5.10)
RED BLOOD CELL COUNT: 4.49 M/uL — ABNORMAL LOW (ref 4.50–5.10)
WHITE BLOOD CELL COUNT: 8.2 10*3/uL (ref 4.0–10.8)
WHITE BLOOD CELL COUNT: 8.5 10*3/uL (ref 4.0–10.8)

## 2011-06-02 LAB — COMPREHENSIVE METABOLIC PANEL
ALANINE AMINOTRANSFERASE: 26 IU/L (ref 7–35)
ALBUMIN: 2.9 g/dl — ABNORMAL LOW (ref 3.4–4.8)
ALKALINE PHOSPHATASE: 104 IU/L (ref 25–106)
ANION GAP: 7 mmol/L (ref 3–11)
ASPARTATE AMINOTRANSFERASE: 30 IU/L (ref 8–34)
BILIRUBIN TOTAL: 0.5 mg/dl (ref 0.2–1.1)
BUN (UREA NITROGEN): 7 mg/dl (ref 6–20)
CALCIUM: 8.6 mg/dl (ref 8.6–10.3)
CARBON DIOXIDE: 28 mmol/L (ref 22–32)
CHLORIDE: 108 mmol/L (ref 101–111)
CREATININE: 0.8 mg/dl (ref 0.4–1.2)
ESTIMATED GLOMERULAR FILT RATE: >60 mL/min (ref >60)
Glucose Random: 101 mg/dl (ref 74–160)
POTASSIUM: 3.9 mmol/L (ref 3.5–5.1)
SODIUM: 143 mmol/L (ref 135–144)
TOTAL PROTEIN: 5.7 g/dl — ABNORMAL LOW (ref 5.9–7.5)

## 2011-06-02 LAB — TROPONIN I: TROPONIN I: <0.01 ng/mL (ref 0.00–0.04)

## 2011-06-02 LAB — PROTHROMBIN TIME
INR: <1 — CL (ref 2.0–3.5)
INR: 1 — ABNORMAL LOW (ref 2.0–3.5)
PROTHROMBIN TIME: 10.1 SECONDS (ref 9.5–11.5)
PROTHROMBIN TIME: 10.4 SECONDS (ref 9.5–11.5)

## 2011-06-02 LAB — APTT
APTT: 24.7 SECONDS (ref 23.7–33.3)
APTT: 25.4 SECONDS (ref 23.7–33.3)

## 2011-06-02 LAB — CTA CHEST W CONTRAST PE PROTOCOL

## 2011-06-02 MED ORDER — HEPARIN SODIUM (PORCINE) 5000 UNIT/ML IJ SOLN
5000.0000 [IU] | Freq: Three times a day (TID) | INTRAMUSCULAR | Status: DC
Start: 2011-06-02 — End: 2011-06-03
  Administered 2011-06-02 – 2011-06-03 (×2): 5000 [IU] via SUBCUTANEOUS
  Filled 2011-06-02 (×2): qty 1

## 2011-06-02 MED ORDER — QUETIAPINE FUMARATE 100 MG PO TABS
200.00 mg | ORAL_TABLET | Freq: Once | ORAL | Status: AC
Start: 2011-06-02 — End: 2011-06-02
  Administered 2011-06-02: 200 mg via ORAL
  Filled 2011-06-02: qty 2

## 2011-06-02 MED ORDER — FOLIC ACID 5 MG/ML IJ SOLN
1.0000 mg | Freq: Every day | INTRAMUSCULAR | Status: DC
Start: 2011-06-03 — End: 2011-06-03

## 2011-06-02 MED ORDER — LEVETIRACETAM 500 MG PO TABS
500.0000 mg | ORAL_TABLET | Freq: Two times a day (BID) | ORAL | Status: DC
Start: 2011-06-02 — End: 2011-06-03
  Administered 2011-06-02 – 2011-06-03 (×2): 500 mg via ORAL
  Filled 2011-06-02 (×2): qty 1

## 2011-06-02 MED ORDER — QUETIAPINE FUMARATE 200 MG PO TABS
200.0000 mg | ORAL_TABLET | Freq: Three times a day (TID) | ORAL | Status: DC
Start: 2011-06-02 — End: 2011-06-03
  Administered 2011-06-02 – 2011-06-03 (×3): 200 mg via ORAL
  Filled 2011-06-02 (×3): qty 1

## 2011-06-02 MED ORDER — FOLIC ACID 1 MG PO TABS
1.0000 mg | ORAL_TABLET | Freq: Every day | ORAL | Status: DC
Start: 2011-06-03 — End: 2011-06-03
  Administered 2011-06-03: 1 mg via ORAL
  Filled 2011-06-02: qty 1

## 2011-06-02 MED ORDER — IPRATROPIUM-ALBUTEROL 0.5-2.5 (3) MG/3ML IN SOLN
3.0000 mL | Freq: Four times a day (QID) | RESPIRATORY_TRACT | Status: DC
Start: 2011-06-02 — End: 2011-06-03
  Administered 2011-06-03 (×2): 3 mL via RESPIRATORY_TRACT
  Filled 2011-06-02 (×2): qty 3

## 2011-06-02 MED ORDER — ASPIRIN 81 MG PO CHEW
CHEWABLE_TABLET | ORAL | Status: DC
Start: 2011-06-02 — End: 2011-06-02
  Filled 2011-06-02: qty 1

## 2011-06-02 MED ORDER — THIAMINE HCL 100 MG/ML IJ SOLN
1000.0000 mL | INTRAVENOUS | Status: AC
Start: 2011-06-02 — End: 2011-06-03
  Administered 2011-06-02: 1000 mL via INTRAVENOUS
  Filled 2011-06-02: qty 1000

## 2011-06-02 MED ORDER — OXAZEPAM 30 MG PO CAPS
30.0000 mg | ORAL_CAPSULE | Freq: Once | ORAL | Status: AC
  Administered 2011-06-02: 30 mg via ORAL
  Filled 2011-06-02: qty 1

## 2011-06-02 MED ORDER — LORAZEPAM 2 MG/ML IJ SOLN
1.0000 mg | INTRAMUSCULAR | Status: DC | PRN
Start: 2011-06-02 — End: 2011-06-03
  Administered 2011-06-02 – 2011-06-03 (×2): 2 mg via INTRAVENOUS
  Filled 2011-06-02 (×2): qty 1

## 2011-06-02 MED ORDER — LORAZEPAM 1 MG PO TABS
2.0000 mg | ORAL_TABLET | Freq: Four times a day (QID) | ORAL | Status: DC | PRN
Start: 2011-06-02 — End: 2011-06-03

## 2011-06-02 MED ORDER — SODIUM CHLORIDE 0.9 % IV SOLN
1000.0000 mL | Freq: Once | INTRAVENOUS | Status: DC
Start: 2011-06-02 — End: 2011-06-02

## 2011-06-02 MED ORDER — NITROGLYCERIN 0.4 MG SL SUBL
SUBLINGUAL_TABLET | SUBLINGUAL | Status: DC
Start: 2011-06-02 — End: 2011-06-02
  Filled 2011-06-02: qty 1

## 2011-06-02 MED ORDER — IPRATROPIUM-ALBUTEROL 0.5-2.5 (3) MG/3ML IN SOLN
3.00 mL | Freq: Once | RESPIRATORY_TRACT | Status: AC
Start: 2011-06-02 — End: 2011-06-02
  Administered 2011-06-02: 3 mL via RESPIRATORY_TRACT
  Filled 2011-06-02: qty 3

## 2011-06-02 MED ORDER — LORAZEPAM 1 MG PO TABS
1.0000 mg | ORAL_TABLET | ORAL | Status: DC | PRN
Start: 2011-06-02 — End: 2011-06-03

## 2011-06-02 MED ORDER — ASPIRIN EC 325 MG PO TBEC
325.0000 mg | DELAYED_RELEASE_TABLET | Freq: Every day | ORAL | Status: DC
Start: 2011-06-03 — End: 2011-06-03
  Administered 2011-06-03: 325 mg via ORAL
  Filled 2011-06-02: qty 1

## 2011-06-02 MED ORDER — THIAMINE HCL 100 MG PO TABS
100.0000 mg | ORAL_TABLET | Freq: Every day | ORAL | Status: DC
Start: 2011-06-03 — End: 2011-06-03
  Administered 2011-06-03: 100 mg via ORAL
  Filled 2011-06-02: qty 1

## 2011-06-02 MED ORDER — INFLUENZA VIRUS VACCINE SPLIT IM SUSP
0.50 mL | Freq: Once | INTRAMUSCULAR | Status: AC
Start: 2011-06-02 — End: 2011-06-02
  Administered 2011-06-02: 0.5 mL via INTRAMUSCULAR
  Filled 2011-06-02: qty 1

## 2011-06-02 MED ORDER — CHLORPROMAZINE HCL 100 MG PO TABS
100.0000 mg | ORAL_TABLET | Freq: Three times a day (TID) | ORAL | Status: DC
Start: 2011-06-02 — End: 2011-06-03
  Administered 2011-06-02 – 2011-06-03 (×3): 100 mg via ORAL
  Filled 2011-06-02 (×3): qty 1

## 2011-06-02 MED ORDER — QUETIAPINE FUMARATE 200 MG PO TABS
200.0000 mg | ORAL_TABLET | Freq: Two times a day (BID) | ORAL | Status: DC
Start: 2011-06-02 — End: 2011-06-02
  Administered 2011-06-02: 200 mg via ORAL
  Filled 2011-06-02 (×3): qty 1

## 2011-06-02 MED ORDER — OXAZEPAM 30 MG PO CAPS
60.0000 mg | ORAL_CAPSULE | Freq: Once | ORAL | Status: AC
  Administered 2011-06-02: 60 mg via ORAL
  Filled 2011-06-02: qty 2

## 2011-06-02 MED ORDER — IOPAMIDOL 76 % IV SOLN
90.00 mL | Freq: Once | INTRAVENOUS | Status: AC
Start: 2011-06-02 — End: 2011-06-02
  Administered 2011-06-02: 90 mL via INTRAVENOUS

## 2011-06-02 MED ORDER — HYDROXYZINE HCL 25 MG PO TABS
50.0000 mg | ORAL_TABLET | ORAL | Status: DC | PRN
Start: 2011-06-02 — End: 2011-06-03

## 2011-06-02 MED ORDER — SODIUM CHLORIDE 0.9 % IV BOLUS
1000.00 mL | Freq: Once | INTRAVENOUS | Status: AC
Start: 2011-06-02 — End: 2011-06-02
  Administered 2011-06-02: 1000 mL via INTRAVENOUS

## 2011-06-02 NOTE — H&P (Addendum)
H&P NOTE     Chief Complaint:   Patient presents with:    Chest Pain - CHEST PAIN      History of Present Illness:  Pt was transferred from [facility] after a rapid response was called for pt experiencing new onset crushing chest pain to the ED and then to the floor.  Pt was admitted to psychiatry for [facility] suggesting harm to others and alcohol abuse. Her last drink was 2-3 days ago. While on [name] she was woken from sleep and she developed new onset crushing severe retrosternal chest pain with no radiation, associated nausea/ vomiting/ diaphoresis/ lightheadedness which lasted approx 20-30 mins and cannot  identifiy an inciting or relieving factor.  She states that when she had the chest pain it was difficult to take a deep breath. But denies cough or fever or sputum production. Denies ever having exertional chest pain in the past or GERD and cannot ever remember having similar pain in the past.  She was seen in the ED and CTA ruled out PE.  CT chest also showed incidental finding of few prominent left subclavian lymph nodes. No pulmonary or mediastinal abnormalities are seen except for a 3 mm pleural-based nodule in the left upper lobe posteriorly.    Review of Systems:  Systems reviewed and all systems negative except  General as in HPI    Patient Active Problem List:  There is no problem list on file for this patient.      Past Medical History:     Past Medical History    Bipolar affective schizo    Schizo affective schizophrenia depress    Depression     Substance abuse          Past Surgical History:   No past surgical history on file.    Medications prior to Admission:     Prescriptions prior to admission:  lorazepam (ATIVAN) 2 MG tablet Take 2 mg by mouth every 6 (six) hours as needed. Disp:  Rfl:    quetiapine (SEROQUEL) 200 MG tablet Take 200 mg by mouth 2 (two) times daily. Disp:  Rfl:    hydrOXYzine (VISTARIL) 50 MG capsule Take 50 mg by mouth every 4 (four) hours as needed. Disp:  Rfl:    chlorproMAZINE  (THORAZINE) 100 MG tablet Take 100 mg by mouth 3 (three) times daily. Disp:  Rfl:    levetiracetam (KEPPRA) 500 MG tablet Take 500 mg by mouth 2 (two) times daily. Disp:  Rfl:          Allergies:   Review of Patient's Allergies indicates:   Bee                        Tegretol                Hives    Social History:  Tobacco Use:   Smoking status: Current Everyday Smoker 1.0 Packs/Day       Alcohol:   Alcohol Use: Yes         Family History: No family history on file.    Physical Exam:  BP 185/91   Pulse 73   Temp(Src) 97.6 F (36.4 C) (Oral)   Resp 16   SpO2 91%  General appearance: alert, appears stated age, cooperative and morbidly obese  Lungs: clear to auscultation bilaterally  Heart:  regular rate and rhythm, S1, S2 normal, no murmur, click, rub or gallop  Abdomen: soft, non-tender; bowel sounds normal; no  masses,  no organomegaly  Extremities: left elbow tender and swelling dressed with surgical dressing, no evidence of compartment syndrome, radial pulses intact.    Intake/Output last 24hours (7a-7a):   I/O 24 Hrs:  In: 240 [P.O.:240]  Out: -     Vital Signs - Last 24 Hours:   BP: (102-185)/(71-99)   Temp:  [97.6 F (36.4 C)-98 F (36.7 C)]   Pulse:  [73-97]   Resp:  [16]   SpO2:  [91 %-100 %]     Vital Signs - Last 8 Hours:   BP: (102-185)/(71-91)   Temp:  [97.6 F (36.4 C)-98 F (36.7 C)]   Pulse:  [73-93]   Resp:  [16]   SpO2:  [91 %-100 %]     Recent Labs:  LFTs:   Component Value Date   AST 30 06/02/2011   ALT 26 06/02/2011   TBILI 0.5 06/02/2011   ALKPHOS 104 06/02/2011     CBC:   Component Value Date   WBC 8.5 06/02/2011   HGB 15.5 06/02/2011   HCT 46.9 06/02/2011   PLTA 194 06/02/2011   RBC 4.49* 06/02/2011     BMP:   Component Value Date   NA 143 06/02/2011   K 3.9 06/02/2011   CL 108 06/02/2011   CO2 28 06/02/2011   BUN 7 06/02/2011   CREAT 0.8 06/02/2011   GLUCOSER 101 06/02/2011   CA 8.6 06/02/2011         Component Value Date   PT 10.4 06/02/2011   INR 1.0* 06/02/2011   APTT 25.4 06/02/2011       Component Value  Date   TROPI < 0.01 06/02/2011       No results found for this basename: BNP      Micro:  None    Imaging:   CTA- no PE, CT chest also showed incidental finding of few prominent left subclavian lymph nodes. No pulmonary or mediastinal abnormalities are seen except for a 3 mm pleural-based nodule in the left upper lobe posteriorly.    Assessment/Plan:  Patient Active Hospital Problem List:  Chest pain (06/02/2011)  Retrosternal chest pressure in pt with history of morbid obesity and   multiple psychiatric illnesses. Functional chest pain vs ACS. ECG sinus tachycardia without ST/T changes  and first trop negative. PE ruled out by CTA.  -trend cardiac enzymes and ECG  - asa daily    Alcohol withdrawal (06/02/2011)  Last drink 3 days ago, tremulous now.  -CIWA protocol.  -thiamine , folate repletion  Psychiatric disorder (06/02/2011)  -stable and no SI/HI.  Continue prior to admission meds  SOB (shortness of breath) (06/02/2011)  One episode when associated with chest pain, no wheezing on current exam   however informed by ED pt had some expiratory wheezes and has 30 pack year   smoking history and is a current smoker.  -duo- nebs prn          Augustina Mood, MD  Pager Number: [phone]

## 2011-06-02 NOTE — ED Provider Notes (Signed)
I performed a history and physical examination of the patient and discussed his management with the PA. I reviewed the PA's note and agree with the documented finding and plan of care except as noted below.     40 year old female who was admitted to [facility] for psychiatric reasons who developed chest pain.  The unresponsiveness: She did receive nitroglycerin which improved her pain.  She also reports shortness of breath and has been fairly non-ambulatory since admission.  Given this we did perform CTA of the chest to rule out PE.  This is negative.  Electrocardiogram showed sinus rhythm without ischemic change. The patient will need to be ruled out for myocardial infarction.  Initial troponin is negative.  She is currently pain-free.  She also received aspirin by the rapid response team.  She was medicated with Serax for her alcohol dependence and seroquel for her psychiatric disease.     786.09CU Dyspnea  786.59T Chest pressure    Zuri Bradway C. Imogene Burn, MD

## 2011-06-02 NOTE — ED Notes (Signed)
Bed:18<BR> Expected date:<BR> Expected time:<BR> Means of arrival:<BR> Comments:<BR>

## 2011-06-02 NOTE — Progress Notes (Signed)
Pt came to team guarded irritable and demanding. Requesting to leave ASAP. No longer want help "in this shit hole"  Pt has been spending most of shift sleeping in room.  CI WA 6 refusing vitals at times.  Threatening to hurt some if they try to take her vitals. Pt denies SI/AH/VH/HI  However looks preoccupied.  Med compliant

## 2011-06-02 NOTE — ED Notes (Signed)
Pt's room assignment changed. Pt now going to room 612B. Called 6 [place], nurse unable to take report.

## 2011-06-02 NOTE — ED Notes (Signed)
Pt very agitated throwing cup of ice across room for being sent to ED feels she is going to loose her bed on [name]

## 2011-06-02 NOTE — Progress Notes (Signed)
PA note    Patient refuses examination of abscess. See ED note and recommendations for management. Please consult when patient is amenable. Would be glad to see patient.

## 2011-06-02 NOTE — Progress Notes (Signed)
PSYCH PROGRESS NOTE    Identifying Data:  Patient is a 40 year old female that was admitted on 06/01/2011       Legal Status: Sec 12    INTERVAL HISTORY:   Pt seen, chart reviewed, discussed with team. In rounds, pt irritable.  Stated she came into the hospital because she had been wanting to hurt people for about 1 week.  When asked anyone in particular, she said no.  She could not say whether there were any recent stressors. Additionally, pt upset that she was not started on seroquel earlier, "I'm not on any of my meds here!"  Seroquel 200 mg BID was started this morning.   Soon after, pt became angry, stated "I don't want to talk anymore!" slamming door behind her.       Allergies:  Review of Patient's Allergies indicates:   Bee                        Tegretol                Hives    Scheduled Meds:        QUEtiapine  200 mg Oral BID    sulfamethoxazole-trimethoprim  1 tablet Oral Once    lorazepam  2 mg Oral Once    nicotine  1 patch Transdermal Once    acetaminophen  650 mg Oral Once    QUEtiapine  200 mg Oral Once    loperamide  2 mg Oral Once    levetiracetam  500 mg Oral BID    nicotine  1 patch Transdermal Daily    bacitracin   Topical Daily    QUEtiapine  200 mg Oral Once    oxazepam  30 mg Oral Q4H    oxazepam  15 mg Oral Q4H    oxazepam  15 mg Oral Q6H    oxazepam  10 mg Oral Q6H         PRN Meds:       chlorproMAZINE 100 mg Q4H PRN   hydrOXYzine 25 mg BID PRN   oxazepam 15 mg Q2H PRN         VITALS:  BP 138/99   Pulse 97   Temp 98 F (36.7 C)   Resp 18   SpO2 96%      MENTAL STATUS EXAM:   Mental Status  General Appearance: Disheveled  Behavior: irritable  Level of Consciousness: alert  Orientation Level: Oriented x3  Attention/Concentration: Diminished  Mannerisms/Movements: No abnormal mannerisms/movements  Speech Rate: Halting  Speech Tone: Loud  Mood: upset  Affect: irritable, angry  Thought Process: Thought withdrawal  Thought Content: homicidal thoughts  Types of Homicidal  Thoughts: Reasons for not doing it  Memory: STM Impaired  Judgment: Poor  Insight: Poor  Dissociative Symptoms: None      Physical/Somatic Complaints  The patient lists: abscess left arm       MULTIAXIAL DIAGNOSIS  Diagnoses   AXIS I Comments: 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 Schizoaffective disorder by history, r/o Substance-induced mood disorder, alcohol abuse and dependence  AXIS II:: Deferred  AXIS III:: Left arm abscess s/p drainage  AXIS IV:: economic problems;housing problems;problems related to social environment;problems with access to health care services;problems with primary support group  Axis V (GAF): 35    Assessment/Formulation (As noted in [name]):   Patient is a 40 year old female with a history of alcohol abuse and dependence, mood d/o vs. Schizoaffective disorder by history,  PTSD by history, seizure disorder, who presented initially to the MED ED complaining of AH telling her to harm others, and multiple physical complaints including left elbow pain from an abscess. She had recently had discharge in early August from [place], and since then had relapsed on alcohol and had not been taking medication, and she stated that these were stolen from her.  There is concerning for a combination of mood symptoms which may well be related to her heavy alcohol abuse and dependence, In addition, her psychotic symptoms are concerning and may be related to substance use or underlying illness. She does present a safety risk given her active AH telling her to harm other people, and given this safety concern in association with her mood, psychosis and substance-related symptoms she will benefit from inpatient level of care for safety, containment and management of her mental illness.      PLAN:  Psychiatric:  Schizoaffective d/o:  --continue seroquel 200 mg BID     Substance:  --continue serax taper given recent intoxication    Safety:  --15 minute checks    Mood:  --atarax prn for anxiety    Medical:  --continue bacitracin for  abscess  --nicotine patch daily  --keppra 500 mg BID for hx of seizures    Dispo:  --coordinate with outside treaters  --continuing need for [facility]          Review with patient: Treatment plan reviewed with the patient.

## 2011-06-02 NOTE — Progress Notes (Signed)
Patient seen, chart reviewed, case discussed with team.  I agree with the assessment and plan as noted in psychiatry resident progress note.

## 2011-06-02 NOTE — ED Notes (Signed)
Pt returned from Ct no PE evidenced dispo to admit

## 2011-06-02 NOTE — ED Triage Note (Signed)
Pt was having 8/10 chest pressure while inpatient on [name] was given nitro 0.5mcg x 2 and unknown amount of aspirin EKG showed ST pain resolved after nitro cont to feel SOB Sat 94/sat

## 2011-06-02 NOTE — ED Provider Notes (Signed)
eMERGENCY dEPARTMENT eNCOUnter      I have reviewed the ED nursing notes and prior records. I have reviewed the patient's past medical history/problem list, allergies, social history and medication list.    This patient was seen with Emergency Department attending physician Dr.Paul Imogene Burn    CHIEF COMPLAINT  Patient presents with:    Chest Pain - CHEST PAIN      HPI  Cheryl Zimmerman is a 40 year old female presenting to the emergency department from [facility] after episode of sudden onset chest pressure and associated dyspnea.  The patient has been in patient over the past 3 days, admitted initially to medicine service for alcohol withdrawal.  Patient was then cleared and transferred from the medicine service to [facility].  Patient admitted that she has not really been eating and has been sleeping a lot.  Staff on [place] for a walk the patient around 5 PM at which point the patient developed sudden onset of chest pressure and dyspnea.  A code was called to evaluate the patient on [name] - the patient was then directly admitted to the medicine floor, but was accidentally brought by medicine residents to the emergency department for further evaluation.  Upon arrival in the emergency department the patient was alert and oriented x3.  She claims he had given her aspirin and 2 sublingual nitroglycerin during the initial evaluation on [name] which significantly improved her symptoms.  Patient still having mild dyspnea and chest pressure and was noted to have a room air oxygen saturation 94%.  Patient was not tachypneic.  Initial electrocardiogram in the emergency department did not reveal any acute ischemic changes.    PAST MEDICAL HISTORY      Past Medical History    Bipolar affective schizo    Schizo affective schizophrenia depress    Depression     Substance abuse          FAMILY HISTORY    No family history on file.    SOCIAL HISTORY    Social History    Marital Status: Divorced            Spouse  Name:                       Years of Education:                 Number of children:               Social History Main Topics    Smoking Status: Current Everyday Smoker         Packs/Day: 1     Years:         Alcohol Use: Yes             Drug Use: Yes                Special: Marijuana    Sexual Activity: Yes               Partners with: Female        SURGICAL HISTORY    No past surgical history on file.    CURRENT MEDICATIONS    1. LORAZEPAM 2 MG PO TABS  Route: Oral TKZ:SWFU 2 mg by mouth every 6 (six) hours as needed.  Dispense:  Refill:   2. QUETIAPINE FUMARATE 200 MG PO TABS  Route: Oral XNA:TFTD 200 mg by mouth 2 (two) times daily.  Dispense:  Refill:  3. HYDROXYZINE PAMOATE 50 MG PO CAPS  Route: Oral ZOX:WRUE 50 mg by mouth every 4 (four) hours as needed.  Dispense:  Refill:   4. CHLORPROMAZINE HCL 100 MG PO TABS  Route: Oral AVW:UJWJ 100 mg by mouth 3 (three) times daily.  Dispense:  Refill:   5. LEVETIRACETAM 500 MG PO TABS  Route: Oral XBJ:YNWG 500 mg by mouth 2 (two) times daily.  Dispense:  Refill:     ALLERGIES    Review of Patient's Allergies indicates:   Bee                        Tegretol                Hives    IMMUNIZATIONS    There is no immunization history on file for this patient.    REVIEW OF SYSTEMS    Pertinent positives were reviewed as per the HPI above. All other systems were reviewed and are negative. See HPI for further details    PHYSICAL EXAM    VITAL SIGNS: BP 102/71   Pulse 93   Temp 98 F   Resp 16   SpO2 100%     Constitutional:  Well developed, well nourished, no acute distress, non-toxic appearance, speaking full sentences  Eyes:  PERRL, conjunctiva pink and  Moist.  HEENT:  Atraumatic, external ears normal, nose normal, oropharynx moist, no pharyngeal exudates. Neck- normal range of motion, no tenderness, supple, no JVD  Respiratory:  No respiratory distress, no rales, faint expiratory wheezing ) the left  Cardiovascular:  Normal rate, normal rhythm, no murmurs, no gallops, no  rubs   GI: Obese, Soft, nondistended, normal bowel sounds, nontender, no organomegaly, no mass, no rebound, no guarding   GU:  No costovertebral angle tenderness   Musculoskeletal:  No edema, no tenderness, no deformities. Back- no tenderness, no peripheral edema  Integument:  Well hydrated, no rash   Lymphatic:  No lymphadenopathy noted   Neurologic:  Alert & oriented x 3, normal motor function, normal sensory function, no focal deficits noted   Psychiatric:  Speech and behavior appropriate     ED COURSE & MEDICAL DECISION MAKING    Pertinent Labs & Imaging studies reviewed.     Medication Orders Placed This Encounter  lorazepam (ATIVAN) 2 MG tablet  quetiapine (SEROQUEL) 200 MG tablet  sodium chloride 0.9 % IV bolus 1,000 mL  ipratropium-albuterol (DUO-NEB) 0.5-2.5 (3) MG/3ML nebulizer solution 3 mL  oxazepam (SERAX) capsule 60 mg  iopamidol (ISOVUE-370) 76 % injection 90 mL  Results for orders placed during the hospital encounter of 06/02/11 (from the past 24 hour(s))   CBC + PLT + AUTO DIFF    Collection Time    06/02/11  6:16 PM   Component Value Comment    WHITE BLOOD CELL 8.2      RED BLOOD CELL COUNT 4.76      HEMOGLOBIN 16.5 (*)     HEMATOCRIT 49.7 (*)     MEAN CORPUSCULAR VOL 104.2 (*)     MEAN CORPUSCULAR HGB 34.6 (*)     MEAN CORP HGB CONC 33.2      RBC DISTRIBUTION WIDTH 15.4 (*)     PLATELET COUNT 211      MEAN PLATELET VOLUME 11.3 (*)     NEUTROPHIL % 60.4      LYMPHOCYTE % 32.7      MONOCYTE % 4.9      EOSINOPHIL % 1.6  BASOPHIL % 0.4      Narrative:     Current Anticoagulant (57)->None  Current Anticoagulant->None   PROTHROMBIN TIME    Collection Time    06/02/11  6:16 PM   Component Value Comment    PROTHROMBIN TIME 10.1      INR < 1.0 (*)     Narrative:     Current Anticoagulant (57)->None  Current Anticoagulant->None   APTT    Collection Time    06/02/11  6:16 PM   Component Value Comment    APTT 24.7      Narrative:     Current Anticoagulant (57)->None  Current  Anticoagulant->None   COMPREHENSIVE METABOLIC PANEL    Collection Time    06/02/11  7:13 PM   Component Value Comment    SODIUM 143      POTASSIUM 3.9      CHLORIDE 108      CARBON DIOXIDE 28      ANION GAP 7      CALCIUM 8.6      Glucose Random 101      BLOOD UREA NITROGEN 7      TOTAL PROTEIN 5.7 (*)     ALBUMIN 2.9 (*)     BILIRUBIN TOTAL 0.5      ALKALINE PHOSPHATASE 104      ASPARTATE AMINOTRANSFERAS 30      CREATININE 0.8      ESTIMATED GLOMERULAR FILT RATE > 60      ALANINE AMINOTRANSFERASE 26     CBC + PLT + AUTO DIFF    Collection Time    06/02/11  7:13 PM   Component Value Comment    WHITE BLOOD CELL 8.5      RED BLOOD CELL COUNT 4.49 (*)     HEMOGLOBIN 15.5      HEMATOCRIT 46.9      MEAN CORPUSCULAR VOL 104.4 (*)     MEAN CORPUSCULAR HGB 34.5 (*)     MEAN CORP HGB CONC 33.1      RBC DISTRIBUTION WIDTH 15.1 (*)     PLATELET COUNT 194      MEAN PLATELET VOLUME 10.6      NEUTROPHIL % 55.9      LYMPHOCYTE % 37.0      MONOCYTE % 5.0      EOSINOPHIL % 1.6      BASOPHIL % 0.5     TROPONIN I    Collection Time    06/02/11  7:13 PM   Component Value Comment    TROPONIN I < 0.01     PROTHROMBIN TIME    Collection Time    06/02/11  7:15 PM   Component Value Comment    PROTHROMBIN TIME 10.4      INR 1.0 (*)     Narrative:     Current Anticoagulant (57)->None   APTT    Collection Time    06/02/11  7:15 PM   Component Value Comment    APTT 25.4      Narrative:     Current Anticoagulant (57)->None       CT CHEST W CONTRAST PE PROTOCOL:  Impression: No evidence of pulmonary embolus. There are a few  prominent left subclavian lymph nodes. No pulmonary or mediastinal  abnormalities are seen except for a 3 mm pleural-based nodule in the  left upper lobe posteriorly.    EKG-NSR, no acute ischemia.    CONDITION ON ADMISSION   Improved and Stable    ADMITTING  DIAGNOSIS  786.09CU Dyspnea  786.59T Chest pressure        Admitting Disposition: Stable and Improved  Admit to:   [facility]  Hand-off  Discussion with:   [facility]  Admission Type:  Observation  Service:   Telemetry  Team:    [employer] Team    Electronically signed by:   Blanchard Mane, PAC, 06/02/2011 8:02 PM  Dept.of Emergency Medicine  [facility]

## 2011-06-02 NOTE — ED Notes (Addendum)
Pt refuses to be on cardiac monitor. Pt very agitated at staff

## 2011-06-02 NOTE — ED Notes (Signed)
Pt transfered to 6 north on floor

## 2011-06-02 NOTE — Progress Notes (Signed)
Patient slept thru the noc. Remains agitated, asking for her belongings, especially her cell phone. Took 2 am Serax with fluid. Refused vital signs and Serax @ 6am stating she will not take meds or do vital signs until she gets her belongings.

## 2011-06-02 NOTE — ED Notes (Signed)
Neb x 1 serax 60mg  for W/D from ETOH

## 2011-06-02 NOTE — Progress Notes (Signed)
Case authorized x3 days of HLOC by [facility], auth # [id number].  Last covered day and date of next review 06/03/11 with with [place]/[facility] [phone], ext [phone].

## 2011-06-03 ENCOUNTER — Inpatient Hospital Stay (HOSPITAL_BASED_OUTPATIENT_CLINIC_OR_DEPARTMENT_OTHER)
Admit: 2011-06-03 | Disposition: A | Discharge status: Home / Self Care | Payer: Self-pay | Source: Ambulatory Visit | Attending: Psychiatry | Admitting: Psychiatry

## 2011-06-03 DIAGNOSIS — L02414 Cutaneous abscess of left upper limb: Secondary | ICD-10-CM | POA: Insufficient documentation

## 2011-06-03 LAB — EKG

## 2011-06-03 LAB — TROPONIN I
TROPONIN I: 0.01 ng/mL (ref 0.00–0.04)
TROPONIN I: 0.01 ng/mL (ref 0.00–0.04)

## 2011-06-03 MED ORDER — FOLIC ACID 1 MG PO TABS
1.00 mg | ORAL_TABLET | Freq: Every day | ORAL | Status: AC
Start: 2011-06-03 — End: 2012-06-02

## 2011-06-03 MED ORDER — AMOXICILLIN-POT CLAVULANATE 875-125 MG PO TABS
1.0000 | ORAL_TABLET | Freq: Two times a day (BID) | ORAL | Status: DC
Start: 2011-06-03 — End: 2011-06-06

## 2011-06-03 MED ORDER — THIAMINE HCL 100 MG PO TABS
100.0000 mg | ORAL_TABLET | Freq: Every day | ORAL | Status: DC
Start: 2011-06-03 — End: 2021-12-17

## 2011-06-03 MED ORDER — OXAZEPAM 15 MG PO CAPS
15.00 mg | ORAL_CAPSULE | Freq: Four times a day (QID) | ORAL | Status: AC
Start: 2011-06-03 — End: 2011-06-04
  Administered 2011-06-03 – 2011-06-04 (×3): 15 mg via ORAL
  Filled 2011-06-03 (×3): qty 1

## 2011-06-03 MED ORDER — AMOXICILLIN-POT CLAVULANATE 875-125 MG PO TABS
1.0000 | ORAL_TABLET | Freq: Two times a day (BID) | ORAL | Status: DC
Start: 2011-06-03 — End: 2011-06-03
  Administered 2011-06-03: 1 via ORAL
  Filled 2011-06-03: qty 1

## 2011-06-03 MED ORDER — CHLORPROMAZINE HCL 50 MG PO TABS
50.0000 mg | ORAL_TABLET | Freq: Three times a day (TID) | ORAL | Status: DC | PRN
Start: 2011-06-03 — End: 2011-06-06
  Administered 2011-06-06: 50 mg via ORAL
  Filled 2011-06-03: qty 1

## 2011-06-03 MED ORDER — OXAZEPAM 15 MG PO CAPS
15.0000 mg | ORAL_CAPSULE | ORAL | Status: DC | PRN
Start: 2011-06-03 — End: 2011-06-06
  Administered 2011-06-04: 15 mg via ORAL
  Filled 2011-06-03 (×2): qty 1

## 2011-06-03 MED ORDER — LEVETIRACETAM 500 MG PO TABS
500.0000 mg | ORAL_TABLET | Freq: Two times a day (BID) | ORAL | Status: DC
Start: 2011-06-03 — End: 2011-06-06
  Administered 2011-06-03 – 2011-06-06 (×6): 500 mg via ORAL
  Filled 2011-06-03 (×6): qty 1

## 2011-06-03 MED ORDER — OXAZEPAM 10 MG PO CAPS
10.00 mg | ORAL_CAPSULE | Freq: Four times a day (QID) | ORAL | Status: AC
Start: 2011-06-04 — End: 2011-06-05
  Administered 2011-06-04 – 2011-06-05 (×3): 10 mg via ORAL
  Filled 2011-06-03 (×3): qty 1

## 2011-06-03 MED ORDER — NICOTINE 14 MG/24HR TD PT24
1.0000 | MEDICATED_PATCH | Freq: Every day | TRANSDERMAL | Status: DC
Start: 2011-06-03 — End: 2011-06-06
  Administered 2011-06-04 – 2011-06-06 (×3): 1 via TRANSDERMAL
  Filled 2011-06-03 (×4): qty 1

## 2011-06-03 MED ORDER — AMOXICILLIN-POT CLAVULANATE 875-125 MG PO TABS
1.0000 | ORAL_TABLET | Freq: Two times a day (BID) | ORAL | Status: DC
Start: 2011-06-03 — End: 2011-06-04
  Administered 2011-06-03 – 2011-06-04 (×2): 1 via ORAL
  Filled 2011-06-03 (×2): qty 1

## 2011-06-03 MED ORDER — HYDROXYZINE HCL 25 MG PO TABS
25.0000 mg | ORAL_TABLET | Freq: Three times a day (TID) | ORAL | Status: DC | PRN
Start: 2011-06-03 — End: 2011-06-06
  Administered 2011-06-04 – 2011-06-06 (×2): 25 mg via ORAL
  Filled 2011-06-03 (×3): qty 1

## 2011-06-03 MED ORDER — QUETIAPINE FUMARATE 200 MG PO TABS
200.0000 mg | ORAL_TABLET | Freq: Two times a day (BID) | ORAL | Status: DC
Start: 2011-06-03 — End: 2011-06-06
  Administered 2011-06-03 – 2011-06-06 (×6): 200 mg via ORAL
  Filled 2011-06-03 (×6): qty 1

## 2011-06-03 NOTE — Progress Notes (Signed)
Pt transferred back to [facility] from 6  north.  Medically cleared. Pt cooperative with transfer and assessment however refused vital signs.  Denies SI/AH/HI/VH however continues to look preoccupied. Pt continues labile and irritable answering yes and no questions.  Body and belonging search done no contraband found. No c/o chest pain or pain.  Pt placed on 15 min checks.
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
## 2011-06-03 NOTE — Progress Notes (Signed)
Reviewed case with Corwin Levins [phone], ext [phone] who agrees with treatment plan and approves 3 additional days of HLOC with last covered day and date of next review 06/06/11 at 11 am.  [facility] considers this a continuation of original admission from 06/01/11 with same authorization # [id number].

## 2011-06-03 NOTE — Progress Notes (Signed)
Pt slept most of this am, woke up to eat her meal. ciwa <10. No ativan required. Pt. Calm and cooperative. Pt being d/c'd to [facility].

## 2011-06-03 NOTE — Initial Assessments (Signed)
Patient seen, chart reviewed, case discussed with team.  I agree with the assessment and plan as noted in psychiatry resident note.  Patient returns from medicine to continue psychiatric stabilization.  Irritable but indicates treatment has been helping as denies current AH.

## 2011-06-03 NOTE — Progress Notes (Signed)
At 1515 the Cheryl Zimmerman was sleeping soundly.  She was awakened for vital signs at 1630, 117/79 pulse 91 and her CIWA 12. At 2030 blood pressure was 130/83 pulse 95 and CIWA 6.  She took a shower and her mood seemed to improve.  She has been visible part of the shift.

## 2011-06-03 NOTE — Discharge Instructions (Signed)
Chest Pain (Nonspecific)

## 2011-06-03 NOTE — Discharge Summary (Signed)
Physician Discharge Summary     Patient ID:  Cheryl Zimmerman  [id number]  40 year old  [birth year]    Outstanding Issues:  Pulmonary nodule -- 3mm, however given smoking hx should have follow up CT in 12 months. If no change, no more imaging neccessary     Admit date: 06/02/11    Discharge date and time: 06/03/11     Admitting Physician: Augustina Mood     Discharge Physician: Reyes Ivan    Discharge Diagnoses: Chest pain, Etoh Withdrawal    Admission Condition: fair    Discharged Condition: good    Indication for Admission: Pt was transferred from [facility] after a rapid response was called for pt experiencing new onset crushing chest pain to the ED and then to the floor.   Pt was admitted to psychiatry for [facility] suggesting harm to others and alcohol abuse. Her last drink was 2-3 days ago. While on [name] she was woken from sleep and she developed new onset crushing severe retrosternal chest pain with no radiation, associated nausea/ vomiting/ diaphoresis/ lightheadedness which lasted approx 20-30 mins and cannot identifiy an inciting or relieving factor.   She states that when she had the chest pain it was difficult to take a deep breath. But denies cough or fever or sputum production. Denies ever having exertional chest pain in the past or GERD and cannot ever remember having similar pain in the past.   She was seen in the ED and CTA ruled out PE.   CT chest also showed incidental finding of few prominent left subclavian lymph nodes. No pulmonary or mediastinal abnormalities are seen except for a 3 mm pleural-based nodule in the left upper lobe posteriorly.         Hospital Course: Patient admitted to hospitalist service for management of chest pain    Course described by problem below:     #Chest Pain EKG on admission nml axis, Qtc 487 qwave in III which was unchanged from prior. Troponin serially negative over 17 hours. Telemetry was without concerning arrhythmias. Patient noted to be chest pain free in AM.     #Etoh  withdrawal -- Patient initially noted to be tremulous during initial rapid response. Patient received 90mg  of Serax over her stay with the hospitalist team. In AM, patient was initially drowsy, but was alert and oriented after given time to awaken. No nausea, no anxiety, etc. No tremor, psychomotor agitation noted on exam.    #Cellulitis/Abscess I+D. Patient given Augmentin 875-125mg  Qhrs. Patient may complete a seven day course for surrounding cellulitis. Please continue daily dressing changes.    Patient is medically cleared for transfer       Consults: none    Significant Diagnostic Studies: See above, EMR.     Discharge Exam:  NAD, A+O  NC,AT  Soft, supple  PERRLA, EOMI  MMMs  S1,s2 no mrg  CTAB  S,nt,nd,+BS  No pedal edema.   No tremor, moving all extremities     Disposition: Psych Admissions to [facility]    Patient Instructions:   Current Discharge Medication List    CONTINUE these medications which have NOT CHANGED    lorazepam (ATIVAN) 2 MG tablet  Take 2 mg by mouth every 6 (six) hours as needed.    quetiapine (SEROQUEL) 200 MG tablet  Take 200 mg by mouth 2 (two) times daily.    hydrOXYzine (VISTARIL) 50 MG capsule  Take 50 mg by mouth every 4 (four) hours as needed.  chlorproMAZINE (THORAZINE) 100 MG tablet  Take 100 mg by mouth 3 (three) times daily.    levetiracetam (KEPPRA) 500 MG tablet  Take 500 mg by mouth 2 (two) times daily.        Activity: activity as tolerated  Diet: regular diet  Wound Care: as directed    [place] Future Appointments:  Current and Future Appointments at [facility] (90 Days)    Date Time Visit Type Department Provider Length    None.            Follow-up with:  Follow-up Information    Follow up With Details Comments Contact Info    Non Staff Provider   [place]                SignedTyson Babinski MD  06/03/2011  12:25 PM

## 2011-06-03 NOTE — Progress Notes (Signed)
Review chart + met w team. Pt is ready to return to [place] [facility] when bed is available.No c/o's voiced by pt.

## 2011-06-04 MED ORDER — IBUPROFEN 400 MG PO TABS
400.0000 mg | ORAL_TABLET | ORAL | Status: DC | PRN
Start: 2011-06-04 — End: 2011-06-06
  Administered 2011-06-04 – 2011-06-05 (×2): 400 mg via ORAL
  Filled 2011-06-04 (×2): qty 1

## 2011-06-04 MED ORDER — LIDOCAINE HCL 1 % IJ SOLN
10.00 mL | Freq: Once | INTRAMUSCULAR | Status: AC
Start: 2011-06-04 — End: 2011-06-05
  Filled 2011-06-04: qty 20

## 2011-06-04 MED ORDER — ACETAMINOPHEN 325 MG PO TABS
650.0000 mg | ORAL_TABLET | Freq: Four times a day (QID) | ORAL | Status: DC | PRN
Start: 2011-06-04 — End: 2011-06-04

## 2011-06-04 MED ORDER — LORAZEPAM 1 MG PO TABS
2.00 mg | ORAL_TABLET | Freq: Once | ORAL | Status: AC
Start: 2011-06-04 — End: 2011-06-04
  Administered 2011-06-04: 2 mg via ORAL

## 2011-06-04 MED ORDER — ACETAMINOPHEN-CODEINE #3 300-30 MG PO TABS
1.00 | ORAL_TABLET | Freq: Once | ORAL | Status: AC
Start: 2011-06-04 — End: 2011-06-04
  Administered 2011-06-04: 1 via ORAL
  Filled 2011-06-04: qty 1

## 2011-06-04 MED ORDER — SULFAMETHOXAZOLE-TMP DS 800-160 MG PO TABS
1.0000 | ORAL_TABLET | Freq: Two times a day (BID) | ORAL | Status: DC
Start: 2011-06-04 — End: 2011-06-06
  Administered 2011-06-04 – 2011-06-06 (×4): 1 via ORAL
  Filled 2011-06-04 (×4): qty 1

## 2011-06-04 MED ORDER — ACETAMINOPHEN-CODEINE #3 300-30 MG PO TABS
2.0000 | ORAL_TABLET | ORAL | Status: DC | PRN
Start: 2011-06-04 — End: 2011-06-06
  Administered 2011-06-04 – 2011-06-06 (×11): 2 via ORAL
  Filled 2011-06-04 (×11): qty 2

## 2011-06-04 NOTE — Consults (Signed)
- SURGERY CONSULT NOTE -    Reason for Consult: Left forearm abscess      Consult Requested by: Thompson    HPI:  59F hx of psychiatric d/o admitted to the Psych unit whom first noted a small bug bite lesion on her left forearm about 1 week ago. It has gotten bigger and more tender. She notes subjective fever and chills at home. It is worse with movement and relieved with rest. She denies N/V, but has been having diarrhea about 4-5 episodes per day for the last week. This was I&D'd in the ER and she was started on Augmentin.     Inpatient Problem List:   Patient Active Problem List:     Chest pain [786.59F]     Alcohol withdrawal [291.81]     Psychiatric disorder [300.9W]     SOB (shortness of breath) [161.05E]     Abscess of left arm [096.3AF]      Past Medical History:     Past Medical History    Bipolar affective schizo    Schizo affective schizophrenia depress    Depression     Substance abuse          Past Surgical History:   No past surgical history on file.    Medications prior to Admission:     No current facility-administered medications on file prior to encounter.  Current outpatient prescriptions ordered prior to encounter:  amoxicillin-clavulanate (AUGMENTIN) 875-125 MG per tablet Take 1 tablet by mouth every 12 (twelve) hours. Disp: 12 tablet Rfl: 0   folic acid (FOLVITE) 1 MG tablet Take 1 tablet by mouth daily. Disp: 30 tablet Rfl: 12   thiamine (VITAMIN B-1) 100 MG tablet Take 1 tablet by mouth daily. Disp: 30 tablet Rfl: 12   quetiapine (SEROQUEL) 200 MG tablet Take 200 mg by mouth 2 (two) times daily. Disp:  Rfl:    hydrOXYzine (VISTARIL) 50 MG capsule Take 50 mg by mouth every 4 (four) hours as needed. Disp:  Rfl:    chlorproMAZINE (THORAZINE) 100 MG tablet Take 100 mg by mouth 3 (three) times daily. Disp:  Rfl:    levetiracetam (KEPPRA) 500 MG tablet Take 500 mg by mouth 2 (two) times daily. Disp:  Rfl:          Current Inpatient Medications:         acetaminophen-codeine  1 tablet Oral Once     lorazepam  2 mg Oral Once    lidocaine  10 mL Other Once    nicotine  1 patch Transdermal Daily    levetiracetam  500 mg Oral BID    QUEtiapine  200 mg Oral BID    oxazepam  15 mg Oral Q6H    oxazepam  10 mg Oral Q6H    amoxicillin-clavulanate  1 tablet Oral Q12H SCH             acetaminophen-codeine 2 tablet Q4H PRN   DISCONTD: acetaminophen 650 mg Q6H PRN   hydrOXYzine 25 mg Q8H PRN   oxazepam 15 mg Q2H PRN   chlorproMAZINE 50 mg Q8H PRN         Allergies:   Review of Patient's Allergies indicates:   Bee                        Tegretol                Hives    Tobacco Use:     Smoking  status: Current Everyday Smoker 1.0 Packs/Day         Alcohol:     Alcohol Use: Yes         Family History:   No family history on file.       Review of Systems:   (+) subjective fever/chllls, diarrhea. (-) abdominal pain, decreased ROM of the arm.    Physical Exam:   BP 134/97   Pulse 87   Ht 5' 9.5" (1.765 m)  Gen: A&O, NAD  Ext: left forearm with 1x1cm area of fluctuance with mild surrounding erythema, warmth, and tenderness. The previous incision is closed over. There is full ROM with flexion/extension of the elbow as well as pronation/supination. She has palpable radial pulse and 5/5 muscle strength in the hand. There is normal sensation to light touch in the median/ulnar/radial nerve distributions.      Vital Signs - Last 24 Hours:   Pulse:  [87-95] 87   BP: (130-150)/(83-102) 134/97 mmHg    Vital Signs - Last 8 Hours:   BP: --  Temp: --  Pulse: --  Resp: --  SpO2: --    Intake/Output last 24hours (7a-7a):        Last 8 Hours:        DATA LAST 24 HOURS:     All data in last 24hours, labs only:    Recent labs:    No results found for this basename: WBC,HGB,HCT,PLTA,NEUT,LYMPH,MO,EOS,BASO in the last 24 hours      No results found for this basename: NA,K,CL,CO2,BUN,CREAT,GLUCOSER,CA,MG,PHOS,TBILI,DBILI,IBIL,AST,ALT,ALKPHOS,ALBUMIN in the last 24 hours        Component Value Date   UACOL orange 05/31/2011   UACLA cloudy  05/31/2011   UAPRO trace 05/31/2011   UAGLU small 05/31/2011   UAKET 1.020 05/31/2011   UAOCC neg 05/31/2011   UANIT neg 05/31/2011   UABIL neg 05/31/2011         Imaging: none    Impression & Recommendations:   89F with left forearm I&D of an abscess that has now closed over and re-accumulated infection. This was re-incised with small amount of old blood and minimal pus expressed. The wound does track medially and no loculations were found with 1/4 inch packing placed. Recommend changing antibiotics to Bactrim DS BID. Cultures were sent and should be followed up on. Surgery to follow for wound care and will re-assess in the AM.     Berneta Levins, MD, 06/04/2011, 8:28 PM       Pager [phone]
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
## 2011-06-04 NOTE — Procedures (Signed)
Informed consent was obtained. Time out performed confirming correct patient, MR #, site and side of procedure. Skin was anesthetized with 1% lidocaine. A 1 cm incision was made over abscess and probed. No locuations. Old blood and small amount of pus expressed and culture taken. Wound packed with 1/4'' packing. Patient tolerated procedure well. No complications.     Berneta Levins, MD  06/04/2011
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
## 2011-06-04 NOTE — Progress Notes (Signed)
Saw pt to assess a L elbow abcess. The abscess was drained by a physician in the ED and pt was put on ABX. Pt had noticed increased redness and swelling and asked for the abscess to be drained again.     BP 134/97   Pulse 87   Ht 5' 9.5" (1.765 m)    Rt elbow exam: Red area about inches diameter with 1.5 inch diameter swollen area with three or four pus points.  The area is on the dorsal aspect of the forearm very close to the elbow and sitting on top of forearm muscle tendon. Pt is unwilling to allow any exam without anesthesia and I feel that given the proximity to the elbow and muscle tendons, local anesthesia and I&D would be more prudently done by a surgeon. Nursing on floor made aware and spoke to [facility] about the case.
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
## 2011-06-05 NOTE — Progress Notes (Signed)
Surgery Progress Note    Subjective:  Patient complains of moderate pain.    Scheduled Meds:      acetaminophen-codeine  1 tablet Oral Once    lorazepam  2 mg Oral Once    lidocaine  10 mL Other Once    sulfamethoxazole-trimethoprim  1 tablet Oral Q12H [facility]    nicotine  1 patch Transdermal Daily    levetiracetam  500 mg Oral BID    QUEtiapine  200 mg Oral BID    oxazepam  15 mg Oral Q6H    oxazepam  10 mg Oral Q6H    DISCONTD: amoxicillin-clavulanate  1 tablet Oral Q12H SCH       Continuous Infusions:   PRN Meds:acetaminophen-codeine, ibuprofen, DISCONTD: acetaminophen, hydrOXYzine, oxazepam, chlorproMAZINE    Objective:    Vital signs in last 24 hours:       Intake/Output last 3 shifts:     Intake/Output this shift:       Gen: NAD, well appearing  Ext: left forearm 1x1 surgical incision, wick in place, small area of surrounding erythema, no fluctuance or induration  Assessment/Plan:  49F POD #1 s/p I&D of left forearm abscess, doing well. Abscess was examined and dressed with packing placed in the abscess cavity.  - Cont. Batrim BID  - Daily dressing changes  - Follow up wound cultures    Charlotte Crumb, MD 06/05/2011,           Pager [phone]

## 2011-06-06 MED ORDER — HYDROXYZINE PAMOATE 50 MG PO CAPS
50.00 mg | ORAL_CAPSULE | ORAL | Status: AC | PRN
Start: 2011-06-06 — End: 2011-06-21

## 2011-06-06 MED ORDER — LEVETIRACETAM 500 MG PO TABS
500.0000 mg | ORAL_TABLET | Freq: Two times a day (BID) | ORAL | Status: DC
Start: 2011-06-06 — End: 2021-11-29

## 2011-06-06 MED ORDER — QUETIAPINE FUMARATE 200 MG PO TABS
200.0000 mg | ORAL_TABLET | Freq: Two times a day (BID) | ORAL | Status: DC
Start: 2011-06-06 — End: 2011-06-21

## 2011-06-06 MED ORDER — IBUPROFEN 400 MG PO TABS
400.0000 mg | ORAL_TABLET | ORAL | Status: DC | PRN
Start: 2011-06-06 — End: 2021-12-02

## 2011-06-06 MED ORDER — ALUMINUM & MAGNESIUM HYDROXIDE 200-200 MG/5ML PO SUSP
30.0000 mL | Freq: Three times a day (TID) | ORAL | Status: DC | PRN
Start: 2011-06-06 — End: 2011-06-06
  Administered 2011-06-06: 30 mL via ORAL
  Filled 2011-06-06 (×3): qty 30

## 2011-06-06 MED ORDER — SULFAMETHOXAZOLE-TMP DS 800-160 MG PO TABS
1.00 | ORAL_TABLET | Freq: Two times a day (BID) | ORAL | Status: AC
Start: 2011-06-06 — End: 2011-06-13

## 2011-06-06 NOTE — Progress Notes (Signed)
Patient received thorazine 50 mg po and atarax 25 mg po at 0105 for anxiety  and sleep.Fransisco Beau RN

## 2011-06-06 NOTE — Progress Notes (Signed)
Patient received tylenol #3 at 0245.Pain 6/10 for pain at wound site.Fransisco Beau RN

## 2011-06-06 NOTE — Progress Notes (Signed)
Feels stable, denies SI/HI, sponsor came over weekend and developed aftercare plan with her. She is going to stay at the [facility]. She will stay in the wet side and try and work into the sober section. Pt also plans on attending the [facility] Outreach program, [facility] for the homeless walk in clinic for PCP and mental health. Pt feel she has done a lot of soul searching, met with her sponsor and is feeling hopeful. States within the last year she has lost her husband and son to cancer with anniversary approaching. Wants to start living her life for them. Pt denies SI/HI/AH/VH/PI, no evidence of psychosis or mania and states intent to maintain sobriety. Pt has money given to her to by her sponsor to take the train to [facility]. She states her goal to make 90 meetings in 90 days and plans on attending one tonight. Denies current safety concerns and will return to the hospital if feeling unsafe.

## 2011-06-06 NOTE — Progress Notes (Signed)
OT initial/discharge note:   Chart reviewed. Pt initially admitted to [facility] on 06/01/11 but was then transferred to medicine on 06/02/11 d/t chest pains. Pt returned to [facility] on 06/03/11 and was discharged today per treatment team with follow up treatment, supports and recommendations. Throughout her stays pt kept mostly to her self, typically disheveled, was difficult to engage and often agitated upon approach. And pt with minimal contact with OT staff d/t pt staying mostly in her room, in bed, often sleeping and making it clear she was not interested in unit groups/meetings. Pt was offered the OT questionnaire when first admitted (06/02/11) which she immediately crumbled up and then mumble something under her breath. However per report pt had a good weekend, met with her sponsor and worked on after care plans. And today pt was more approachable with +ADL care, reporting feeling better and ready to leave. Pt was evaluated by treatment team and was discharged without incident. At discharge pt with improving symptom management, improving mood, improving self care, increased future oriented and treatment focused. D/c reviewed with nursing.
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
## 2011-06-06 NOTE — Progress Notes (Signed)
06/06/2011 Case reviewed with [name].  Pt. approved till d/c on Wednesday as discussed in team.  Currently pt is detoxing.dac

## 2011-06-06 NOTE — Progress Notes (Signed)
Rn note:  Discharge paperwork signed.  Med teaching done and discharge planning reviewed in treatment team.  Pt denies being suicidal or homicidal at time of discharge.  Pt feels safe to go shelter.  No psychotic sx evident.  Medications reviewed.  Pt has scripts /medications for discharge.  Pt agrees with discharge plans.

## 2011-06-06 NOTE — Progress Notes (Signed)
Saw Cheryl Zimmerman with the blue team.  Agree with Dr. Gladstone Pih evaluation and assessment.   The patient related she is 'doing well' and is less irritable, improved from her re-admission to [facility].  She was visible and social on the unit over the weekend.  She received PRN trazodone, Atarax and Tylenol#3.  When seen, Cheryl Zimmerman related she feels 'stabilized, 100% better, feeling healthy physically and mentally', and she is not experiencing any suicidal or homicidal ideation or plans.  Cheryl Zimmerman related her sponsor visited and she would like to be discharged and go to the [facility] and then transitioned to more stable housing.   Cheryl Zimmerman related she did 'a lot od soul searching' over the weekend and she realizes she should not feel sorry for herself but be proactive in her own recovery and healing.  She wants to attend AA '90 time in the next 90 days' and requested discharge today.  As Cheryl Zimmerman has improved emotionally and behaviorally, and is future oriented and engaged in her recovery we decided to discharge her to the [facility].  Surgery was consulted and recommended 7 days of oral Bactrim as an outpatient for elbow abscess.  On discharge, Cheryl Zimmerman denied suicidal or homicidal ideation, intent or plan.  She is at no imminent risk to self or others, and felt encouraged and hopefull for the future.  Continue medications as prescribed.  Follow up medical and psychiatric care will be at the healthcare for the homeless through the shelter.  Agree with plan, Galvin Proffer MD

## 2011-06-09 LAB — WOUND CULTURE AND GRAM STAIN

## 2011-10-07 NOTE — Discharge Summary (Signed)
Date of Admission:06/01/2011  Date of Discharge: 06/01/2012    HISTORY OF PRESENT ILLNESS:  Please see previous completed admission note for background history and data for this patient admitted on June 01, 2011, discharged on June 02, 2011 on a conditional voluntary status.    HOSPITAL COURSE:  The patient is a 41 year old female with history of alcohol abuse and dependence, mood disorder versus schizoaffective disorder, and PTSD, who presented to the med ED complaining of auditory hallucinations telling her to harm others.  She also reported racing thoughts and voices telling her to hurt other people by cutting their throats.  When initially seen on the unit on September 12, she was in bed and declined meeting other than limited bedside interaction.  She did not agree with restarting medications, getting settled on the unit at present.  She was encouraged to meet with the full team in a.m.  On the second day, she complained of chest pain, and a rapid response was called, and the patient was transferred to medicine.    CONSULTATIONS:  Medicine.    LABORATORY DATA AND STUDIES:  Please see admission laboratories and studies.    MENTAL STATUS:  GENERAL:  At time of discharge was sedated, lying in bed, drowsy.  SPEECH:  Slowed.  MOOD:  Down.  AFFECT:  Congruent with mood.  THOUGHT PROCESS:  Goal directed.  THOUGHT CONTENT:  Auditory hallucinations.  INSIGHT:  Poor.  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  AXIS II:  Deferred.  AXIS III:  Left arm abscess status post drainage.  AXIS IV:  Economic problems, housing problems.  AXIS V:  Global assessment of functioning of 35.    DISCHARGE CASE FORMULATION:  The patient is a 41 year old female with history of alcohol abuse and dependence, PTSD, and schizoaffective disorder, initially presenting to the ED with auditory hallucinations, who was  noncompliant with her current antipsychotic medications.  She initially was not forthcoming with her history and then later, the second day, complained of sudden onset chest pain.  Rapid response was called, and the patient was transferred to medicine.    MEDICATIONS AT TIME OF DISCHARGE:  Seroquel 200 mg 3 times a day, hydroxyzine 50 mg every 4 hours as needed, Thorazine300 mg by mouth 3 times daily, Keppra 500 mg b.i.d.    DISCHARGE INSTRUCTIONS:  Please see hospitalist's notes for their further medical evaluation.    (Note reviewed, Jed Limerick, MD)  ___________________________  Reviewed and Electronically Signed By: Dr. Caron Presume MD  Sig Date: 10/11/2011  Sig Time: 09:04:10  Dictated By: Arleta Creek MD  Dict Date: 10/07/2011 Dict Time: 05 06 PM    Dictation Date and Time:10/07/2011 17:06:40  Transcription Date and Time:10/07/2011 17:27:44  eScription Dictation id: [id number] Confirmation # :[id number]

## 2015-08-25 ENCOUNTER — Encounter (HOSPITAL_COMMUNITY): Payer: Self-pay

## 2015-08-25 ENCOUNTER — Emergency Department (HOSPITAL_COMMUNITY)
Admission: EM | Admit: 2015-08-25 | Discharge: 2015-08-26 | Disposition: A | Discharge status: Other Acute Inpatient Hospital | Payer: Self-pay | Attending: Emergency Medicine | Admitting: Emergency Medicine

## 2015-08-25 DIAGNOSIS — F17200 Nicotine dependence, unspecified, uncomplicated: Secondary | ICD-10-CM | POA: Insufficient documentation

## 2015-08-25 DIAGNOSIS — J45909 Unspecified asthma, uncomplicated: Secondary | ICD-10-CM | POA: Insufficient documentation

## 2015-08-25 DIAGNOSIS — M549 Dorsalgia, unspecified: Secondary | ICD-10-CM | POA: Insufficient documentation

## 2015-08-25 DIAGNOSIS — R569 Unspecified convulsions: Secondary | ICD-10-CM | POA: Insufficient documentation

## 2015-08-25 DIAGNOSIS — F419 Anxiety disorder, unspecified: Secondary | ICD-10-CM | POA: Insufficient documentation

## 2015-08-25 DIAGNOSIS — F329 Major depressive disorder, single episode, unspecified: Secondary | ICD-10-CM | POA: Insufficient documentation

## 2015-08-25 DIAGNOSIS — Z8719 Personal history of other diseases of the digestive system: Secondary | ICD-10-CM | POA: Insufficient documentation

## 2015-08-25 DIAGNOSIS — Z79899 Other long term (current) drug therapy: Secondary | ICD-10-CM | POA: Insufficient documentation

## 2015-08-25 DIAGNOSIS — R45851 Suicidal ideations: Secondary | ICD-10-CM

## 2015-08-25 DIAGNOSIS — F1310 Sedative, hypnotic or anxiolytic abuse, uncomplicated: Secondary | ICD-10-CM | POA: Insufficient documentation

## 2015-08-25 DIAGNOSIS — R440 Auditory hallucinations: Secondary | ICD-10-CM

## 2015-08-25 DIAGNOSIS — F32A Depression, unspecified: Secondary | ICD-10-CM

## 2015-08-25 DIAGNOSIS — F4310 Post-traumatic stress disorder, unspecified: Secondary | ICD-10-CM | POA: Diagnosis present

## 2015-08-25 HISTORY — DX: Schizoaffective disorder, unspecified: F25.9

## 2015-08-25 HISTORY — DX: Depression, unspecified: F32.A

## 2015-08-25 HISTORY — DX: Schizoaffective disorder, bipolar type: F25.0

## 2015-08-25 HISTORY — DX: Unspecified asthma, uncomplicated: J45.909

## 2015-08-25 HISTORY — DX: Anxiety disorder, unspecified: F41.9

## 2015-08-25 HISTORY — DX: Major depressive disorder, single episode, unspecified: F32.9

## 2015-08-25 HISTORY — DX: Gastritis, unspecified, without bleeding: K29.70

## 2015-08-25 HISTORY — DX: Unspecified convulsions: R56.9

## 2015-08-25 LAB — ACETAMINOPHEN LEVEL: Acetaminophen (Tylenol), Serum: <10 ug/mL — ABNORMAL LOW (ref 10–30)

## 2015-08-25 LAB — COMPREHENSIVE METABOLIC PANEL
ALBUMIN: 4.3 g/dL (ref 3.5–5.0)
ALK PHOS: 87 U/L (ref 38–126)
ALT: 17 U/L (ref 14–54)
AST: 18 U/L (ref 15–41)
Anion gap: 10 (ref 5–15)
BUN: 10 mg/dL (ref 6–20)
CALCIUM: 9.8 mg/dL (ref 8.9–10.3)
CHLORIDE: 105 mmol/L (ref 101–111)
CO2: 25 mmol/L (ref 22–32)
CREATININE: 0.71 mg/dL (ref 0.44–1.00)
GFR calc Af Amer: >60 mL/min (ref >60)
GFR calc non Af Amer: >60 mL/min (ref >60)
GLUCOSE: 107 mg/dL — AB (ref 65–99)
Potassium: 3.4 mmol/L — ABNORMAL LOW (ref 3.5–5.1)
SODIUM: 140 mmol/L (ref 135–145)
Total Bilirubin: 0.4 mg/dL (ref 0.3–1.2)
Total Protein: 7.9 g/dL (ref 6.5–8.1)

## 2015-08-25 LAB — CBC
HEMATOCRIT: 41.2 % (ref 36.0–46.0)
HEMOGLOBIN: 14.1 g/dL (ref 12.0–15.0)
MCH: 33.9 pg (ref 26.0–34.0)
MCHC: 34.2 g/dL (ref 30.0–36.0)
MCV: 99 fL (ref 78.0–100.0)
Platelets: 420 10*3/uL — ABNORMAL HIGH (ref 150–400)
RBC: 4.16 MIL/uL (ref 3.87–5.11)
RDW: 13.6 % (ref 11.5–15.5)
WBC: 8.3 10*3/uL (ref 4.0–10.5)

## 2015-08-25 LAB — RAPID URINE DRUG SCREEN, HOSP PERFORMED
AMPHETAMINES: NOT DETECTED
BARBITURATES: NOT DETECTED
Benzodiazepines: POSITIVE — AB
Cocaine: NOT DETECTED
OPIATES: NOT DETECTED
TETRAHYDROCANNABINOL: NOT DETECTED

## 2015-08-25 LAB — VALPROIC ACID LEVEL: Valproic Acid Lvl: <10 ug/mL — ABNORMAL LOW (ref 50.0–100.0)

## 2015-08-25 LAB — ETHANOL: Alcohol, Ethyl (B): <5 mg/dL (ref <5)

## 2015-08-25 LAB — SALICYLATE LEVEL

## 2015-08-25 MED ORDER — PERPHENAZINE 8 MG PO TABS
8.0000 mg | ORAL_TABLET | Freq: Two times a day (BID) | ORAL | Status: DC
  Administered 2015-08-25 – 2015-08-26 (×3): 8 mg via ORAL
  Filled 2015-08-25 (×4): qty 1

## 2015-08-25 MED ORDER — LORAZEPAM 1 MG PO TABS
1.0000 mg | ORAL_TABLET | Freq: Once | ORAL | Status: AC
Start: 2015-08-25 — End: 2015-08-25
  Administered 2015-08-25: 1 mg via ORAL
  Filled 2015-08-25: qty 1

## 2015-08-25 MED ORDER — PANTOPRAZOLE SODIUM 40 MG PO TBEC
40.0000 mg | DELAYED_RELEASE_TABLET | Freq: Every day | ORAL | Status: DC
  Administered 2015-08-25 – 2015-08-26 (×2): 40 mg via ORAL
  Filled 2015-08-25 (×2): qty 1

## 2015-08-25 MED ORDER — CLONAZEPAM 1 MG PO TABS
1.0000 mg | ORAL_TABLET | Freq: Every day | ORAL | Status: DC | PRN
  Administered 2015-08-25 (×2): 1 mg via ORAL
  Filled 2015-08-25: qty 1
  Filled 2015-08-25: qty 2
  Filled 2015-08-25: qty 1

## 2015-08-25 MED ORDER — LEVETIRACETAM 500 MG PO TABS
500.0000 mg | ORAL_TABLET | ORAL | Status: DC
  Filled 2015-08-25: qty 1

## 2015-08-25 MED ORDER — SERTRALINE HCL 50 MG PO TABS
100.0000 mg | ORAL_TABLET | Freq: Every day | ORAL | Status: DC
  Administered 2015-08-25 – 2015-08-26 (×2): 100 mg via ORAL
  Filled 2015-08-25 (×2): qty 2

## 2015-08-25 MED ORDER — QUETIAPINE FUMARATE 100 MG PO TABS
200.0000 mg | ORAL_TABLET | Freq: Every day | ORAL | Status: DC
Start: 2015-08-25 — End: 2015-08-26
  Administered 2015-08-25: 250 mg via ORAL
  Filled 2015-08-25: qty 2

## 2015-08-25 MED ORDER — SODIUM CHLORIDE 0.9 % IV SOLN
1000.0000 mg | Freq: Once | INTRAVENOUS | Status: AC
  Administered 2015-08-25: 1000 mg via INTRAVENOUS
  Filled 2015-08-25: qty 10

## 2015-08-25 MED ORDER — METHOCARBAMOL 500 MG PO TABS
750.0000 mg | ORAL_TABLET | Freq: Three times a day (TID) | ORAL | Status: DC
  Administered 2015-08-25 – 2015-08-26 (×3): 750 mg via ORAL
  Filled 2015-08-25 (×3): qty 2

## 2015-08-25 MED ORDER — IBUPROFEN 200 MG PO TABS
600.0000 mg | ORAL_TABLET | Freq: Three times a day (TID) | ORAL | Status: DC | PRN
  Administered 2015-08-25 – 2015-08-26 (×2): 600 mg via ORAL
  Filled 2015-08-25 (×2): qty 3

## 2015-08-25 MED ORDER — ACETAMINOPHEN 325 MG PO TABS
650.0000 mg | ORAL_TABLET | ORAL | Status: DC | PRN
  Administered 2015-08-25 (×2): 650 mg via ORAL
  Filled 2015-08-25 (×2): qty 2

## 2015-08-25 MED ORDER — ZOLPIDEM TARTRATE 5 MG PO TABS
5.0000 mg | ORAL_TABLET | Freq: Every evening | ORAL | Status: DC | PRN
  Administered 2015-08-25: 5 mg via ORAL
  Filled 2015-08-25: qty 1

## 2015-08-25 MED ORDER — PRAZOSIN HCL 1 MG PO CAPS
1.0000 mg | ORAL_CAPSULE | Freq: Every day | ORAL | Status: DC
  Administered 2015-08-25: 1 mg via ORAL
  Filled 2015-08-25 (×2): qty 1

## 2015-08-25 MED ORDER — LEVETIRACETAM 500 MG PO TABS
500.0000 mg | ORAL_TABLET | Freq: Two times a day (BID) | ORAL | Status: DC
  Administered 2015-08-25 – 2015-08-26 (×2): 500 mg via ORAL
  Filled 2015-08-25 (×3): qty 1

## 2015-08-25 MED ORDER — QUETIAPINE FUMARATE 50 MG PO TABS
50.0000 mg | ORAL_TABLET | Freq: Two times a day (BID) | ORAL | Status: DC
  Administered 2015-08-25 – 2015-08-26 (×3): 50 mg via ORAL
  Filled 2015-08-25 (×4): qty 1

## 2015-08-25 MED ORDER — ONDANSETRON HCL 4 MG PO TABS: 4.0000 mg | ORAL_TABLET | Freq: Three times a day (TID) | ORAL | Status: DC | PRN

## 2015-08-25 NOTE — ED Notes (Signed)
Patient reports anxiety and depression. C/o chronic hip and back pain. Appears restless. Questioning her medications and why all her pain meds were not ordered.  Encouragement offered. Environment adjusted. Given ordered medications and prns.  Q 15 safety checks continue.
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
## 2015-08-25 NOTE — ED Notes (Signed)
Pt oriented to room and unit.  Pt was irritable and didn't want to participate in assessment.  15 minute checks and video monitoring continue.
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
## 2015-08-25 NOTE — ED Notes (Signed)
Patient has two bags of belongings in locker 26. 

## 2015-08-25 NOTE — Progress Notes (Addendum)
Patient has been referred to the following facilities: [facility] - per Dois DavenportSandra, fax referral for review. [facility] - per intake, fax referral for the waitlist. [facility] - left voicemail. [facility] - per Bobbe MedicoAiysha, fax it for the waitlist, 2 female 1 female beds, and 1 female geriatric beds. [facility] - per intake, fax referral for review. [facility] - left voicemail.  At capacity: [facility] - per Aimie, fax referral, 1 female geriatric bed open. [facility] - per Yvetta Coderracy [facility] - per [facility] - per Gigi  CSW will continue to seek placement.  [place] Abtsatia Anhar Mcdermott, LCSWA Disposition staff 08/25/2015 8:20 PM

## 2015-08-25 NOTE — ED Notes (Signed)
Pt asking for her daily meds for her head and back pain.  EDP aware.  Home meds not inputted as of yet.  Pharmacy called and will inform pharm tech to input them.

## 2015-08-25 NOTE — ED Notes (Signed)
PT has walked with TSS to conference room for an evaluation

## 2015-08-26 ENCOUNTER — Inpatient Hospital Stay (HOSPITAL_COMMUNITY)
Admission: AD | Admit: 2015-08-26 | Discharge: 2015-09-02 | DRG: 885 | Disposition: A | Discharge status: Home / Self Care | Payer: Federal, State, Local not specified - Other | Source: Intra-hospital | Attending: Psychiatry | Admitting: Psychiatry

## 2015-08-26 ENCOUNTER — Encounter (HOSPITAL_COMMUNITY): Payer: Self-pay

## 2015-08-26 DIAGNOSIS — G894 Chronic pain syndrome: Secondary | ICD-10-CM | POA: Diagnosis present

## 2015-08-26 DIAGNOSIS — F17200 Nicotine dependence, unspecified, uncomplicated: Secondary | ICD-10-CM | POA: Diagnosis present

## 2015-08-26 DIAGNOSIS — G4700 Insomnia, unspecified: Secondary | ICD-10-CM | POA: Diagnosis present

## 2015-08-26 DIAGNOSIS — R45851 Suicidal ideations: Secondary | ICD-10-CM | POA: Diagnosis present

## 2015-08-26 DIAGNOSIS — Z91410 Personal history of adult physical and sexual abuse: Secondary | ICD-10-CM

## 2015-08-26 DIAGNOSIS — Z590 Homelessness: Secondary | ICD-10-CM | POA: Diagnosis not present

## 2015-08-26 DIAGNOSIS — F419 Anxiety disorder, unspecified: Secondary | ICD-10-CM | POA: Diagnosis present

## 2015-08-26 DIAGNOSIS — F251 Schizoaffective disorder, depressive type: Principal | ICD-10-CM | POA: Diagnosis present

## 2015-08-26 DIAGNOSIS — F4310 Post-traumatic stress disorder, unspecified: Secondary | ICD-10-CM | POA: Diagnosis present

## 2015-08-26 DIAGNOSIS — K219 Gastro-esophageal reflux disease without esophagitis: Secondary | ICD-10-CM | POA: Diagnosis present

## 2015-08-26 DIAGNOSIS — F259 Schizoaffective disorder, unspecified: Secondary | ICD-10-CM | POA: Diagnosis present

## 2015-08-26 MED ORDER — CLONAZEPAM 0.5 MG PO TABS
0.5000 mg | ORAL_TABLET | Freq: Two times a day (BID) | ORAL | Status: DC | PRN
  Administered 2015-08-26: 0.5 mg via ORAL
  Filled 2015-08-26: qty 1

## 2015-08-26 MED ORDER — SERTRALINE HCL 100 MG PO TABS
100.0000 mg | ORAL_TABLET | Freq: Every day | ORAL | Status: DC
  Administered 2015-08-27 – 2015-09-02 (×7): 100 mg via ORAL
  Filled 2015-08-26 (×8): qty 1

## 2015-08-26 MED ORDER — CLONAZEPAM 0.5 MG PO TABS
0.5000 mg | ORAL_TABLET | Freq: Two times a day (BID) | ORAL | Status: DC
  Administered 2015-08-26: 0.5 mg via ORAL
  Filled 2015-08-26: qty 1

## 2015-08-26 MED ORDER — ALUM & MAG HYDROXIDE-SIMETH 200-200-20 MG/5ML PO SUSP: 30.0000 mL | ORAL | Status: DC | PRN

## 2015-08-26 MED ORDER — TRAZODONE HCL 100 MG PO TABS: 100.0000 mg | ORAL_TABLET | Freq: Every evening | ORAL | Status: DC | PRN

## 2015-08-26 MED ORDER — LORAZEPAM 1 MG PO TABS: 1.0000 mg | ORAL_TABLET | ORAL | Status: DC | PRN

## 2015-08-26 MED ORDER — BENZTROPINE MESYLATE 0.5 MG PO TABS
0.5000 mg | ORAL_TABLET | Freq: Every evening | ORAL | Status: DC
  Administered 2015-08-26 – 2015-08-31 (×6): 0.5 mg via ORAL
  Filled 2015-08-26 (×8): qty 1

## 2015-08-26 MED ORDER — OLANZAPINE 5 MG PO TBDP: 5.0000 mg | ORAL_TABLET | Freq: Three times a day (TID) | ORAL | Status: DC | PRN

## 2015-08-26 MED ORDER — HALOPERIDOL LACTATE 5 MG/ML IJ SOLN: 5.0000 mg | Freq: Three times a day (TID) | INTRAMUSCULAR | Status: DC | PRN

## 2015-08-26 MED ORDER — PANTOPRAZOLE SODIUM 40 MG PO TBEC
40.0000 mg | DELAYED_RELEASE_TABLET | Freq: Every day | ORAL | Status: DC
  Administered 2015-08-27 – 2015-09-02 (×7): 40 mg via ORAL
  Filled 2015-08-26 (×2): qty 1
  Filled 2015-08-26: qty 7
  Filled 2015-08-26 (×6): qty 1

## 2015-08-26 MED ORDER — PRAZOSIN HCL 1 MG PO CAPS
1.0000 mg | ORAL_CAPSULE | Freq: Every day | ORAL | Status: DC
  Administered 2015-08-27 – 2015-09-01 (×5): 1 mg via ORAL
  Filled 2015-08-26 (×9): qty 1

## 2015-08-26 MED ORDER — MAGNESIUM HYDROXIDE 400 MG/5ML PO SUSP: 30.0000 mL | Freq: Every day | ORAL | Status: DC | PRN

## 2015-08-26 MED ORDER — QUETIAPINE FUMARATE 200 MG PO TABS
200.0000 mg | ORAL_TABLET | Freq: Every day | ORAL | Status: DC
  Administered 2015-08-27 – 2015-08-28 (×2): 200 mg via ORAL
  Filled 2015-08-26 (×5): qty 1

## 2015-08-26 MED ORDER — CLONAZEPAM 1 MG PO TABS: 1.0000 mg | ORAL_TABLET | Freq: Three times a day (TID) | ORAL | Status: DC | PRN

## 2015-08-26 MED ORDER — PERPHENAZINE 8 MG PO TABS: 8.0000 mg | ORAL_TABLET | Freq: Two times a day (BID) | ORAL | Status: DC

## 2015-08-26 MED ORDER — NICOTINE 21 MG/24HR TD PT24
21.0000 mg | MEDICATED_PATCH | Freq: Every day | TRANSDERMAL | Status: DC
  Administered 2015-08-27 – 2015-09-02 (×7): 21 mg via TRANSDERMAL
  Filled 2015-08-26 (×10): qty 1

## 2015-08-26 MED ORDER — DIPHENHYDRAMINE HCL 25 MG PO CAPS
50.0000 mg | ORAL_CAPSULE | Freq: Three times a day (TID) | ORAL | Status: DC | PRN
  Administered 2015-08-27 – 2015-08-30 (×5): 50 mg via ORAL
  Filled 2015-08-26 (×5): qty 2

## 2015-08-26 MED ORDER — DIPHENHYDRAMINE HCL 50 MG/ML IJ SOLN: 25.0000 mg | Freq: Three times a day (TID) | INTRAMUSCULAR | Status: DC | PRN

## 2015-08-26 MED ORDER — IBUPROFEN 600 MG PO TABS
600.0000 mg | ORAL_TABLET | Freq: Four times a day (QID) | ORAL | Status: DC | PRN
  Administered 2015-08-27 – 2015-08-28 (×5): 600 mg via ORAL
  Filled 2015-08-26 (×5): qty 1

## 2015-08-26 MED ORDER — LEVETIRACETAM 500 MG PO TABS
500.0000 mg | ORAL_TABLET | Freq: Two times a day (BID) | ORAL | Status: DC
  Administered 2015-08-26 – 2015-09-01 (×12): 500 mg via ORAL
  Filled 2015-08-26 (×14): qty 1

## 2015-08-26 MED ORDER — ZIPRASIDONE MESYLATE 20 MG IM SOLR
20.0000 mg | INTRAMUSCULAR | Status: DC | PRN
Start: 2015-08-26 — End: 2015-08-26

## 2015-08-26 MED ORDER — METHOCARBAMOL 750 MG PO TABS
750.0000 mg | ORAL_TABLET | Freq: Three times a day (TID) | ORAL | Status: DC
  Administered 2015-08-26 – 2015-09-02 (×20): 750 mg via ORAL
  Filled 2015-08-26 (×25): qty 1

## 2015-08-26 MED ORDER — HALOPERIDOL 5 MG PO TABS
5.0000 mg | ORAL_TABLET | Freq: Three times a day (TID) | ORAL | Status: DC | PRN
  Administered 2015-08-27 – 2015-08-30 (×5): 5 mg via ORAL
  Filled 2015-08-26 (×6): qty 1

## 2015-08-26 MED ORDER — FLUPHENAZINE HCL 5 MG PO TABS
5.0000 mg | ORAL_TABLET | Freq: Every evening | ORAL | Status: DC
  Administered 2015-08-26 – 2015-08-27 (×2): 5 mg via ORAL
  Filled 2015-08-26 (×3): qty 1

## 2015-08-26 MED ORDER — ACETAMINOPHEN 325 MG PO TABS
650.0000 mg | ORAL_TABLET | Freq: Four times a day (QID) | ORAL | Status: DC | PRN
Start: 2015-08-26 — End: 2015-08-26

## 2015-08-26 MED ORDER — BACITRACIN-NEOMYCIN-POLYMYXIN 400-5-5000 EX OINT
TOPICAL_OINTMENT | Freq: Three times a day (TID) | CUTANEOUS | Status: DC
  Administered 2015-08-26 – 2015-08-27 (×2): 1 via TOPICAL
  Administered 2015-08-28 (×3): via TOPICAL
  Administered 2015-08-29 – 2015-09-01 (×2): 1 via TOPICAL
  Filled 2015-08-26 (×5): qty 1

## 2015-08-26 MED ORDER — CLONAZEPAM 0.5 MG PO TABS: 0.5000 mg | ORAL_TABLET | Freq: Two times a day (BID) | ORAL | Status: DC

## 2015-08-26 NOTE — Progress Notes (Signed)
[facility] [place]- [phone] - According to them pt received 5 days supply of Klonopin 0.5 mg po daily once prn for 5 days last week of november. Prior to that she received another few days supply in February.  Pt also received 5 days supply of Tramadol last week of November. Medications prescribed by Dr.Kouperschmidt. Pt claims - she was on Klonopin 1 mg tid - which per pharmacy is not true.     Jomarie LongsSaramma Lamae Fosco ,MD [facility]

## 2015-08-26 NOTE — Progress Notes (Signed)
MEDICATION RELATED CONSULT NOTE - FOLLOW UP   Pharmacy Consult for Home med/klonipin/tramadol    Home Medications per [facility]: Seroquel, Prazosin, Sertraline, Methocarbamol, Levetiracetam called in by prescriber as new prescription that have not been picked up.  NO Clonazepam or Tramadol was called in [facility].  [facility]am [facility] [place] [facility] [phone] was clonazepam 1 mg # 5 1 tablet dailt as needed for anxiety on 08/02/15.  Also filled tramadol #15 1 tablet tidprn pain on 08/18/15.    [facility] not [facility] clonazepam or tramadol and does not have prescription on file.   Suzanne Dancerayne, Suzanne Kline Suzanne Kline 08/26/2015,4:09 PM

## 2015-08-26 NOTE — BH Assessment (Signed)
[facility] Assessment Progress Note  Per Thedore MinsMojeed Akintayo, MD, this pt requires psychiatric hospitalization at this time.  Berneice Heinrichina Tate, RN, [facility] has assigned pt to Rm 508-2.  Pt has signed Voluntary Admission and Consent for Treatment, as well as Consent to Release Information to no one, and signed forms have been fax[facility] to [facility].  Pt's nurse has been notified, and agrees to send original paperwork along with pt via [place], and to call report to [phone].  Doylene Canninghomas Maddisyn Hegwood, MA Triage Specialist [phone]

## 2015-08-26 NOTE — ED Notes (Signed)
Patient was standing at the bathroom whispering, "the voices are saying bad things to me". Patient was bleeding on lt arm
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
## 2015-08-26 NOTE — Progress Notes (Signed)
Patient ID: Suzanne Kline, female   DOB: 09/11/1971, 44 y.o.   MRN: [id number] Initial Interdisciplinary Treatment Plan   PATIENT STRESSORS: Financial difficulties Loss of support Marital or family conflict Medication change or noncompliance Traumatic event   PATIENT STRENGTHS: Ability for insight Communication skills General fund of knowledge   PROBLEM LIST: Problem List/Patient Goals Date to be addressed Date deferred Reason deferred Estimated date of resolution  "I want to get stabilized on my medications" 08/26/2015                                                       DISCHARGE CRITERIA:  Ability to meet basic life and health needs Adequate post-discharge living arrangements Improved stabilization in mood, thinking, and/or behavior Need for constant or close observation no longer present Safe-care adequate arrangements made  PRELIMINARY DISCHARGE PLAN: Attend aftercare/continuing care group Outpatient therapy Placement in alternative living arrangements  PATIENT/FAMIILY INVOLVEMENT: This treatment plan has been presented to and reviewed with the patient, Suzanne Kline .The patient and family have been given the opportunity to ask questions and make suggestions.  Aurora Maskwyman, Klover Priestly E 08/26/2015, 5:04 PM

## 2015-08-26 NOTE — Progress Notes (Signed)
Pt presents with increased agittion and irritability. Finger food tray brought to unit, per MD order. Pt noted in day room eating.

## 2015-08-27 ENCOUNTER — Encounter (HOSPITAL_COMMUNITY): Payer: Self-pay | Admitting: Psychiatry

## 2015-08-27 DIAGNOSIS — F4310 Post-traumatic stress disorder, unspecified: Secondary | ICD-10-CM

## 2015-08-27 DIAGNOSIS — G894 Chronic pain syndrome: Secondary | ICD-10-CM | POA: Diagnosis present

## 2015-08-27 MED ORDER — LORAZEPAM 1 MG PO TABS
1.0000 mg | ORAL_TABLET | Freq: Four times a day (QID) | ORAL | Status: AC
  Administered 2015-08-27 – 2015-08-28 (×4): 1 mg via ORAL
  Filled 2015-08-27 (×4): qty 1

## 2015-08-27 MED ORDER — ONDANSETRON 4 MG PO TBDP: 4.0000 mg | ORAL_TABLET | Freq: Four times a day (QID) | ORAL | Status: AC | PRN

## 2015-08-27 MED ORDER — DIPHENHYDRAMINE HCL 25 MG PO CAPS
ORAL_CAPSULE | ORAL | Status: AC
  Filled 2015-08-27: qty 2

## 2015-08-27 MED ORDER — LORAZEPAM 1 MG PO TABS
1.0000 mg | ORAL_TABLET | Freq: Three times a day (TID) | ORAL | Status: AC
  Administered 2015-08-28 – 2015-08-29 (×3): 1 mg via ORAL
  Filled 2015-08-27 (×3): qty 1

## 2015-08-27 MED ORDER — HYDROXYZINE HCL 25 MG PO TABS
25.0000 mg | ORAL_TABLET | Freq: Four times a day (QID) | ORAL | Status: AC | PRN
  Administered 2015-08-28: 25 mg via ORAL
  Filled 2015-08-27: qty 1

## 2015-08-27 MED ORDER — THIAMINE HCL 100 MG/ML IJ SOLN
100.0000 mg | Freq: Once | INTRAMUSCULAR | Status: DC
  Filled 2015-08-27: qty 2

## 2015-08-27 MED ORDER — LORATADINE 10 MG PO TABS
10.0000 mg | ORAL_TABLET | Freq: Every day | ORAL | Status: DC
  Administered 2015-08-27 – 2015-09-02 (×7): 10 mg via ORAL
  Filled 2015-08-27 (×9): qty 1

## 2015-08-27 MED ORDER — LORAZEPAM 1 MG PO TABS
1.0000 mg | ORAL_TABLET | Freq: Two times a day (BID) | ORAL | Status: AC
  Administered 2015-08-29 – 2015-08-30 (×2): 1 mg via ORAL
  Filled 2015-08-27 (×2): qty 1

## 2015-08-27 MED ORDER — LIDOCAINE 5 % EX PTCH
1.0000 | MEDICATED_PATCH | CUTANEOUS | Status: DC
  Administered 2015-08-27 – 2015-08-30 (×2): 1 via TRANSDERMAL
  Filled 2015-08-27 (×7): qty 1

## 2015-08-27 MED ORDER — LORAZEPAM 1 MG PO TABS
1.0000 mg | ORAL_TABLET | Freq: Four times a day (QID) | ORAL | Status: AC | PRN
  Administered 2015-08-29: 1 mg via ORAL
  Filled 2015-08-27: qty 1

## 2015-08-27 MED ORDER — LOPERAMIDE HCL 2 MG PO CAPS: 2.0000 mg | ORAL_CAPSULE | ORAL | Status: AC | PRN

## 2015-08-27 MED ORDER — LORAZEPAM 1 MG PO TABS
1.0000 mg | ORAL_TABLET | Freq: Every day | ORAL | Status: AC
  Administered 2015-08-31: 1 mg via ORAL
  Filled 2015-08-27: qty 1

## 2015-08-27 MED ORDER — GABAPENTIN 100 MG PO CAPS
100.0000 mg | ORAL_CAPSULE | Freq: Three times a day (TID) | ORAL | Status: DC
  Administered 2015-08-27 – 2015-08-28 (×3): 100 mg via ORAL
  Filled 2015-08-27 (×6): qty 1

## 2015-08-27 MED ORDER — POTASSIUM CHLORIDE CRYS ER 10 MEQ PO TBCR
10.0000 meq | EXTENDED_RELEASE_TABLET | Freq: Two times a day (BID) | ORAL | Status: AC
  Administered 2015-08-27 – 2015-08-28 (×2): 10 meq via ORAL
  Filled 2015-08-27 (×2): qty 1

## 2015-08-27 MED ORDER — ADULT MULTIVITAMIN W/MINERALS CH
1.0000 | ORAL_TABLET | Freq: Every day | ORAL | Status: DC
  Administered 2015-08-27 – 2015-09-02 (×7): 1 via ORAL
  Filled 2015-08-27 (×9): qty 1

## 2015-08-27 MED ORDER — VITAMIN B-1 100 MG PO TABS
100.0000 mg | ORAL_TABLET | Freq: Every day | ORAL | Status: DC
  Administered 2015-08-28 – 2015-09-02 (×6): 100 mg via ORAL
  Filled 2015-08-27 (×7): qty 1

## 2015-08-27 NOTE — Progress Notes (Signed)
D: Pt presents anxious, irritable, and demanding. Pt was noted to be pleasant at times. Pt reports that her voices are loud. "They are saying bad things". She denies any current SI/HI/VH. She declined group because of the volume of her voices. Pt reports feeling "eerie" in response to having any withdrawal symptoms.   A: Writer administered scheduled and prn medications to pt, per MD orders. Continued support and availability as needed was extended to this pt. Staff continues to monitor pt with q5715min checks.  R: No adverse drug reactions noted. Pt receptive to treatment. Pt remains safe at this time.

## 2015-08-27 NOTE — Plan of Care (Signed)
Problem: Ineffective individual coping Goal: STG: Patient will remain free from self harm Outcome: Progressing No further self harm this shift.

## 2015-08-27 NOTE — Progress Notes (Signed)
D:Patient in bed with cover over her head first of shift.  Refused to talk with this nurse or go to any group activities."Just leave me along." Later pt refused to get up to talk meds(see MAR). A: Emotional support provided.  Q 15 minute safety checks continued. Patient encouraged to interact with peers and attend groups.  R:   Unable to get patient out of the bed o get her to talk to nurse. Will continue to monitor with every 15 minute checks.
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
## 2015-08-27 NOTE — [facility] Group Notes (Signed)
[facility] Group Notes:  (Counselor/Nursing/MHT/Case Management/Adjunct)  08/27/2015 1:15PM  Type of Therapy:  Group Therapy  Participation Level:  Active  Participation Quality:  Appropriate  Affect:  Flat  Cognitive:  Oriented  Insight:  Improving  Engagement in Group:  Limited  Engagement in Therapy:  Limited  Modes of Intervention:  Discussion, Exploration and Socialization  Summary of Progress/Problems: The topic for group was balance in life.  Pt participated in the discussion about when their life was in balance and out of balance and how this feels.  Pt discussed ways to get back in balance and short term goals they can work on to get where they want to be. Invited.  "The nurse told me I didn't have to come to group because I took Ativan."   Daryel Geraldorth, Janique Hoefer B 08/27/2015 1:58 PM
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
## 2015-08-27 NOTE — BHH Suicide Risk Assessment (Signed)
[facility] Admission Suicide Risk Assessment   Nursing information obtained from:    Demographic factors:    Current Mental Status:    Loss Factors:    Historical Factors:    Risk Reduction Factors:    Total Time spent with patient: 30 minutes Principal Problem: Schizoaffective disorder, depressive type ([name]) Diagnosis:   Patient Active Problem List   Diagnosis Date Noted  . Chronic pain syndrome [G89.4] 08/27/2015  . Schizoaffective disorder, depressive type ([name]) [F25.1] 08/26/2015  . Posttraumatic stress disorder [F43.10] 08/26/2015     Continued Clinical Symptoms:    The "Alcohol Use Disorders Identification Test", Guidelines for Use in Primary Care, Second Edition.  World Science writer [facility]). Score between 0-7:  no or low risk or alcohol related problems. Score between 8-15:  moderate risk of alcohol related problems. Score between 16-19:  high risk of alcohol related problems. Score 20 or above:  warrants further diagnostic evaluation for alcohol dependence and treatment.   CLINICAL FACTORS:   Chronic Pain More than one psychiatric diagnosis Unstable or Poor Therapeutic Relationship Previous Psychiatric Diagnoses and Treatments Medical Diagnoses and Treatments/Surgeries   Musculoskeletal: Strength & Muscle Tone: within normal limits Gait & Station: normal Patient leans: N/A  Psychiatric Specialty Exam: Physical Exam  Review of Systems  Psychiatric/Behavioral: Positive for depression and hallucinations. The patient is nervous/anxious and has insomnia.   All other systems reviewed and are negative.   Blood pressure 122/82, pulse 83, temperature 98.2 F (36.8 C), temperature source Oral, resp. rate 20, height  (1.753 m), weight 111.131 kg (245 lb), SpO2 100 %.Body mass index is 36.16 kg/(m^2).  General Appearance: Fairly Groomed  Patent attorney::  Fair  Speech:  Clear and Coherent  Volume:  Increased  Mood:  Anxious, Dysphoric and Irritable  Affect:  Labile   Thought Process:  Coherent  Orientation:  Full (Time, Place, and Person)  Thought Content:  Hallucinations: Auditory Visual, Paranoid Ideation and Rumination  Suicidal Thoughts:  No  Homicidal Thoughts:  No  Memory:  Immediate;   Fair Recent;   Fair Remote;   Fair  Judgement:  Impaired  Insight:  Fair  Psychomotor Activity:  Restlessness  Concentration:  Poor  Recall:  [employer] of Knowledge:Fair  Language: Poor  Akathisia:  No  Handed:  Right  AIMS (if indicated):     Assets:  Communication Skills Desire for Improvement  Sleep:     Cognition: WNL  ADL's:  Intact     COGNITIVE FEATURES THAT CONTRIBUTE TO RISK:  Closed-mindedness, Polarized thinking and Thought constriction (tunnel vision)    SUICIDE RISK:   Moderate:  Frequent suicidal ideation with limited intensity, and duration, some specificity in terms of plans, no associated intent, good self-control, limited dysphoria/symptomatology, some risk factors present, and identifiable protective factors, including available and accessible social support.  PLAN OF CARE:Patient will benefit from inpatient treatment and stabilization.  Estimated length of stay is 5-7 days.  Reviewed past medical records,treatment plan.   I have discussed treatment plan with Aggie NP - Please also refer to H&P.  Will restart home medications where indicated. Will discontinue trilafon as pt states it is in effective - discussed Prolixin with pt who was agreeable. Started on Prolixin 5 mg po daily for psychosis. Will add Cogentin 0.5 mg po daily for EPS. Will continue Seroquel 200 mg po qhs for sleep- pt reports that is the only medication that helps her. Will add Gabapentin 100 mg po tid for chronic pain, anxiety. Will  provide Lidocaine patch as well as motrin po 600 mg as scheduled prn for pain. As per pharmacy as well as [facility] [place]- pt was not on a regular dose of Klonopin. I have also reviewed [place]  controlled substance database- she has never filled klonopin here in [place]  as she claims . Will start CIWA /Ativan protocol. Will continue to monitor vitals ,medication compliance and treatment side effects while patient is here.  Will monitor for medical issues as well as call consult as needed.  Reviewed labs , K+ - low- cbc- wnl , UDS- pos for BZD, BAL<5 will order TSH, lipid panel, hba1c, PL as well as EKG. Will replace K+ and repeat BMP. CSW will start working on disposition.  Patient to participate in therapeutic milieu .       Medical Decision Making:  Review of Psycho-Social Stressors (1), Review or order clinical lab tests (1), Decision to obtain old records (1), Established Problem, Worsening (2), Review of Last Therapy Session (1), Review of Medication Regimen & Side Effects (2) and Review of New Medication or Change in Dosage (2)  I certify that inpatient services furnished can reasonably be expected to improve the patient's condition.   Quianna Avery MD 08/27/2015, 11:20 AM
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
## 2015-08-27 NOTE — BHH Group Notes (Signed)
B[facility] Group Notes:  (Nursing/MHT/Case Management/Adjunct)  Date:  08/27/2015  Time:  10:28 AM  Type of Therapy:  Nurse Education  Participation Level:  Did Not Attend  Participation Quality:    Affect:    Cognitive:    Insight:    Engagement in Group:    Modes of Intervention:  Discussion and Education  Summary of Progress/Problems:   Group topic was Leisure and Lifestyle changes.  Questionnaire ball activity.  Suzanne Kline did not attend group as she was meeting with the Child psychotherapistsocial worker.  [name] 08/27/2015, 10:28 AM

## 2015-08-27 NOTE — Progress Notes (Signed)
Pt did not attend wrap up group this evening.  

## 2015-08-27 NOTE — Progress Notes (Signed)
DAR Note: Suzanne Kline has been visible on the unit.  She continues to be demanding and irritable.  She was upset this morning that she wasn't on her 1mg  Klonopin so she refused to take the 0.5mg  pill as needed.  She denies any SI/HI or A/V hallucinations.  She completed her self inventory and reports that her depression and anxiety are 10/10 and her hopelessness 4/10.  She requested something for agitation and haldol/benadryl given with good relief. She was started on the Ativan detox protocol and has been feeling sleepy.  Attempted to give clean her wound and place neosporin but she declined because she was "too sleepy."  She stated that her goal for today is to work on "my depression/anxiety and to given my dinner privileges back" and she plans on accomplishing her goals by "attending groups."  She went to the cafeteria and follow the rules of letting staff give her spoon/fork and she threw them away with staff present.  Encouraged participation in group and unit activities.  Q 15 minute checks maintained for safety.  We will continue to monitor the progress towards her goals.

## 2015-08-28 MED ORDER — FLUPHENAZINE HCL 2.5 MG PO TABS
7.5000 mg | ORAL_TABLET | Freq: Every evening | ORAL | Status: DC
  Administered 2015-08-28 – 2015-08-31 (×4): 7.5 mg via ORAL
  Filled 2015-08-28 (×5): qty 3

## 2015-08-28 MED ORDER — FLUTICASONE PROPIONATE HFA 110 MCG/ACT IN AERO
2.0000 | INHALATION_SPRAY | Freq: Two times a day (BID) | RESPIRATORY_TRACT | Status: DC | PRN
  Filled 2015-08-28 (×2): qty 12

## 2015-08-28 MED ORDER — NAPROXEN 500 MG PO TABS
500.0000 mg | ORAL_TABLET | Freq: Two times a day (BID) | ORAL | Status: DC | PRN
  Administered 2015-08-29 – 2015-08-31 (×5): 500 mg via ORAL
  Filled 2015-08-28 (×5): qty 1

## 2015-08-28 MED ORDER — GABAPENTIN 100 MG PO CAPS
200.0000 mg | ORAL_CAPSULE | Freq: Three times a day (TID) | ORAL | Status: DC
Start: 2015-08-28 — End: 2015-09-02
  Administered 2015-08-28 – 2015-09-02 (×20): 200 mg via ORAL
  Filled 2015-08-28 (×29): qty 2

## 2015-08-28 MED ORDER — SIMVASTATIN 20 MG PO TABS
20.0000 mg | ORAL_TABLET | Freq: Every day | ORAL | Status: DC
  Administered 2015-08-28 – 2015-09-01 (×5): 20 mg via ORAL
  Filled 2015-08-28 (×7): qty 1

## 2015-08-28 MED ORDER — FAMOTIDINE 20 MG PO TABS
20.0000 mg | ORAL_TABLET | Freq: Two times a day (BID) | ORAL | Status: DC
  Administered 2015-08-28 – 2015-09-02 (×10): 20 mg via ORAL
  Filled 2015-08-28 (×12): qty 1

## 2015-08-28 NOTE — Progress Notes (Signed)
Patient did not attend wrap up group. 
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
## 2015-08-29 DIAGNOSIS — R45851 Suicidal ideations: Secondary | ICD-10-CM

## 2015-08-29 DIAGNOSIS — F251 Schizoaffective disorder, depressive type: Principal | ICD-10-CM

## 2015-08-29 LAB — BASIC METABOLIC PANEL
Anion gap: 6 (ref 5–15)
BUN: 17 mg/dL (ref 6–20)
CO2: 23 mmol/L (ref 22–32)
Calcium: 8.8 mg/dL — ABNORMAL LOW (ref 8.9–10.3)
Chloride: 106 mmol/L (ref 101–111)
Creatinine, Ser: 0.97 mg/dL (ref 0.44–1.00)
GFR calc Af Amer: >60 mL/min (ref >60)
Glucose, Bld: 98 mg/dL (ref 65–99)
POTASSIUM: 3.9 mmol/L (ref 3.5–5.1)
SODIUM: 135 mmol/L (ref 135–145)

## 2015-08-29 MED ORDER — LORAZEPAM 2 MG/ML IJ SOLN: 2.0000 mg | Freq: Once | INTRAMUSCULAR | Status: AC

## 2015-08-29 MED ORDER — QUETIAPINE FUMARATE 300 MG PO TABS
300.0000 mg | ORAL_TABLET | Freq: Every day | ORAL | Status: DC
  Administered 2015-08-29 – 2015-09-01 (×4): 300 mg via ORAL
  Filled 2015-08-29 (×7): qty 1

## 2015-08-29 MED ORDER — LORAZEPAM 2 MG/ML IJ SOLN
INTRAMUSCULAR | Status: AC
  Administered 2015-08-29: 2 mg
  Filled 2015-08-29: qty 1

## 2015-08-29 NOTE — Progress Notes (Addendum)
1:1 DAR Note Suzanne Kline was found in the floor of the bathroom.  She states that she got dizzy and slid in the floor.  She was able to get up and walk with assistance.  She walked to the bed and laid down.  Encouraged her to not get up without assistance.  Neuro checks negative.  No bruising noted.  Pt declines notifying family of the fall.  Discussed with MD and order obtained for 1:1 to be initiated.  Sitter by side for safety. VSS.  We will continue to monitor the progress towards her goals.  1:1 sitter remains for safety.

## 2015-08-29 NOTE — Progress Notes (Signed)
1:1 DAR Note  [name] continues on 1:1 for safety. Sitter is in room at bedside and within arms length. She has been in bed most of evening and has taken naps. She voices no concerns at this time and no complaints of pain voiced. Her nero checks are within normal limits. She did not attend group; however was encouraged to do so. She has also been encouraged to continue to work on her goals. She does contract for safety while on unit. She is in no physical distress. 1:1 will continue for safety. She remains safe on the unit and Q 15 minute checks are in progress and maintained and nurse rounding continues for safety.

## 2015-08-29 NOTE — Progress Notes (Signed)
1:1 DAR Note: Suzanne Kline has been in room in bed since her fall.  She has been sleeping quietly.  VS stable.  She reports that her pain is worse at 10/10, back and hip pain.  "I always have back and hip pain but feels worse."  She is able to move her extremities and neuro checks are negative.  She went back to sleep after her vital signs were taken.  She appears to be in no physical distress.  1:1 sitter remains at side and Suzanne Kline remains safe on the unit.  We will continue to monitor the progress towards her goals.

## 2015-08-29 NOTE — [facility] Group Notes (Signed)
[facility] Group Notes: (Clinical Social Work)   08/29/2015      Type of Therapy:  Group Therapy   Participation Level:  Did Not Attend despite MHT prompting   Ambrose MantleMareida Grossman-Orr, LCSW 08/29/2015, 12:52 PM

## 2015-08-29 NOTE — Progress Notes (Signed)
1:1 DAR Note: Suzanne Kline remains on 1:1 for safety with sitter by side.  She has been up and ambulating on the unit.  She states that her pain is 9/10 and naproxen given for hip and back pain.  No bruising noted.  Neuro checks remain negative.  She denies any dizziness.  She was up in the day room coloring a picture for the nurses to sign and wanted to have that picture placed on the wall.  She is currently laying down resting after eating 100 % of her supper.  She appears to be in no physical distress.  1:1 will continue for safety.  Encouraged continued participation in group and unit activities.  We will continue to monitor the progress towards her goals.  Suzanne Kline remains safe on the unit.

## 2015-08-29 NOTE — Progress Notes (Signed)
Did not attend group 
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
## 2015-08-29 NOTE — Progress Notes (Signed)
D: Pt remained in her room asleep for the majority of the shift. Pt denied any SI/I/VH. Pt woke up complaining of body aches, and anxiety r/t withdrawal. Pt also noted for some agitation in the morning A: Writer administered scheduled and prn medications to pt, per MD orders. Continued support and availability as needed was extended to this pt. Staff continues to monitor pt with q3015min checks.  R: No adverse drug reactions noted. Pt receptive to treatment. Pt remains safe at this time.
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
## 2015-08-30 LAB — TSH: TSH: 1.611 u[IU]/mL (ref 0.350–4.500)

## 2015-08-30 LAB — LIPID PANEL
CHOL/HDL RATIO: 5 ratio
CHOLESTEROL: 185 mg/dL (ref 0–200)
HDL: 37 mg/dL — AB (ref >40)
LDL Cholesterol: 97 mg/dL (ref 0–99)
TRIGLYCERIDES: 254 mg/dL — AB (ref <150)
VLDL: 51 mg/dL — ABNORMAL HIGH (ref 0–40)

## 2015-08-30 NOTE — Progress Notes (Signed)
D [name] is seen lying in her bed with the covers pulled over her head. She does not want to speak when this nurse went to her room this morning. She refused to take her morning medications and said " just leave me alone".    A No seizure activity noted .    R 1:1 cont  And safety maintained.

## 2015-08-30 NOTE — [facility] Group Notes (Addendum)
[place] Group Notes:  (Nursing/MHT/Case Management/Adjunct)  Date:  08/30/2015  Time:  1000  Type of Therapy:  Nurse Education ? Life SKills The focus of the group is to help patients idnetify unhealthy behaviors as well as to identify healthy things they can do to help them get their needs met.  Participation Level:  Patient did not attend.  Participation Quality:  N/A  Affect:  N/A  Cognitive:  N/A  Insight:  N/A  Engagement in Group:  N/A  Modes of Intervention:    Summary of Progress/Problems:  Lauralyn Primes 08/30/2015, 1:24 PM

## 2015-08-30 NOTE — [facility] Group Notes (Signed)
[facility] Group Notes: (Clinical Social Work)   08/30/2015      Type of Therapy:  Group Therapy   Participation Level:  Did Not Attend despite MHT prompting   Ambrose MantleMareida Grossman-Orr, LCSW 08/30/2015, 1:09 PM
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
## 2015-08-30 NOTE — Progress Notes (Signed)
Patient did not attend group.

## 2015-08-30 NOTE — Progress Notes (Signed)
1:1 Note Patient observed ambulating on unit with sitter at side for safety.. Patient alert and oriented. Nero checks WNL. Patient slept last night. Q 15 minute checks in progress and maintained for safety. Monitoring continues.

## 2015-08-30 NOTE — Progress Notes (Addendum)
1;1 D Pt has been out in the milieu after getting OOB earlier. She has attended her groups. She has visited with her 1;1 sitter. She is animated, conversing normally and she is apologizing for her behaviors ( yesterday).     A she denies any siezure activity and none observed. She completed her daily assessment and on it she wrote she deneid SI today and she rated her depression, hopelessness and anxiety " 01/23/09", respectively.    R Safety is in place.

## 2015-08-30 NOTE — Progress Notes (Signed)
1:1 Suzanne Kline has been out in the milieu this afternoon. She requested prn medication at 1500 " for all this agitation" and was given haldol and benadryl po, per MD order. She was resting 1 hr later.     A She has had no behavioral incidents today, no seizure-like activity and / or manipulative-like behaviors. She is encouraged by this writer to be direct in gettig her needs met, ie  Saying " would you help me...." and " I need..."     R 1:1 continued per MD order. Pt is receptive to encourage,ment offered to her by this nurse.

## 2015-08-30 NOTE — Progress Notes (Signed)
1:1 Note: Patient observed in bed asleep. Respirations even and non labored. No distress noted. Sitter is at bedside within arms length. Q 15 minute checks maintained and in progress for safety. Monitoring continues.

## 2015-08-31 LAB — HEMOGLOBIN A1C
HEMOGLOBIN A1C: 5.2 % (ref 4.8–5.6)
MEAN PLASMA GLUCOSE: 103 mg/dL

## 2015-08-31 MED ORDER — HYDROXYZINE HCL 25 MG PO TABS
25.0000 mg | ORAL_TABLET | Freq: Four times a day (QID) | ORAL | Status: DC | PRN
  Filled 2015-08-31: qty 1

## 2015-08-31 MED ORDER — QUETIAPINE FUMARATE 25 MG PO TABS
ORAL_TABLET | ORAL | Status: AC
  Filled 2015-08-31: qty 1

## 2015-08-31 MED ORDER — QUETIAPINE FUMARATE 25 MG PO TABS
25.0000 mg | ORAL_TABLET | Freq: Two times a day (BID) | ORAL | Status: DC | PRN
  Administered 2015-08-31: 25 mg via ORAL

## 2015-08-31 MED ORDER — NAPROXEN 500 MG PO TABS
500.0000 mg | ORAL_TABLET | Freq: Two times a day (BID) | ORAL | Status: DC
  Administered 2015-08-31 – 2015-09-02 (×4): 500 mg via ORAL
  Filled 2015-08-31 (×7): qty 1
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
## 2015-08-31 NOTE — Progress Notes (Signed)
Pt remains 1:1, currently sitting in group participating in discussion.  MD to evaluate whether 1:1 remains necessary for safety upon evaluation this morning.  Pt remains without injury.  Ok EdwardsSheila Shondrea Steinert, RN

## 2015-08-31 NOTE — Progress Notes (Signed)
Patient ID: Suzanne BigDeborah Kline, female   DOB: 06/04/1971, 44 y.o.   MRN: [id number]  1:1 Note  D: Pt currently asleep; no s/s of distress noted. Respirations regular and unlabored. A: Pt remains on 1:1 observation. R: No needs at this time.
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
## 2015-08-31 NOTE — Progress Notes (Signed)
Patient ID: Suzanne Kline, female   DOB: 02/11/1971, 44 y.o.   MRN: [id number]  1:1 Note  D: Upon entering patient's room, she stated "I am going to have a seizure tonight, I can tell that I will". Pt pleasant and cooperative with care. A: Sitter at bedside, encourage nutrition and fluid intake, encourage staff/peer interaction, administer medications as ordered. R: RN assured pt and encouraged med compliance. Pt did take medications as ordered. No inappropriate behaviors noted this shift. No s/s of distress.

## 2015-08-31 NOTE — Progress Notes (Signed)
Patient ID: Suzanne Kline, female   DOB: 03/04/1971, 44 y.o.   MRN: [id number]  1:1 Note  D: Patient awake, pleasant and cooperative with staff. Pt in dayroom drinking coffee. No inappropriate behaviors noted. A: Pt remains on 1:1 observation. R: Pt with no behavioral issues and no s/s of distress noted.

## 2015-08-31 NOTE — [facility] Group Notes (Signed)
[facility] Group Notes:  (Counselor/Nursing/MHT/Case Management/Adjunct)  08/31/2015 1:15PM  Type of Therapy:  Group Therapy  Participation Level:  Invited.  Chose to not attend  Summary of Progress/Problems: The topic for group was balance in life.  Pt participated in the discussion about when their life was in balance and out of balance and how this feels.  Pt discussed ways to get back in balance and short term goals they can work on to get where they want to be.    Suzanne Kline, Suzanne Kline 08/31/2015 2:27 PM

## 2015-08-31 NOTE — Progress Notes (Signed)
1400: Pt did not attend group today, she rested in her room, Act team here at 2pm to discuss discharge plans with patient.  Ok EdwardsSheila Tarren Velardi, RN

## 2015-09-01 DIAGNOSIS — G894 Chronic pain syndrome: Secondary | ICD-10-CM

## 2015-09-01 LAB — LEVETIRACETAM LEVEL: Levetiracetam Lvl: 5.6 ug/mL — ABNORMAL LOW (ref 10.0–40.0)

## 2015-09-01 MED ORDER — FLUPHENAZINE DECANOATE 25 MG/ML IJ SOLN
25.0000 mg | INTRAMUSCULAR | Status: DC
  Administered 2015-09-01: 25 mg via INTRAMUSCULAR
  Filled 2015-09-01: qty 1

## 2015-09-01 MED ORDER — LEVETIRACETAM 500 MG PO TABS
500.0000 mg | ORAL_TABLET | Freq: Every day | ORAL | Status: DC
  Administered 2015-09-02: 500 mg via ORAL
  Filled 2015-09-01 (×2): qty 1

## 2015-09-01 MED ORDER — LEVETIRACETAM 500 MG PO TABS
1000.0000 mg | ORAL_TABLET | Freq: Every evening | ORAL | Status: DC
Start: 2015-09-01 — End: 2015-09-02
  Administered 2015-09-01: 1000 mg via ORAL
  Filled 2015-09-01 (×2): qty 2

## 2015-09-01 MED ORDER — ACETAMINOPHEN 325 MG PO TABS
650.0000 mg | ORAL_TABLET | Freq: Once | ORAL | Status: AC
  Administered 2015-09-01: 650 mg via ORAL
  Filled 2015-09-01 (×2): qty 2
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
## 2015-09-01 NOTE — [facility] Group Notes (Signed)
[facility] LCSW Group Therapy  09/01/2015 , 12:39 PM   Type of Therapy:  Group Therapy  Participation Level:  Active  Participation Quality:  Attentive  Affect:  Appropriate  Cognitive:  Alert  Insight:  Improving  Engagement in Therapy:  Engaged  Modes of Intervention:  Discussion, Exploration and Socialization  Summary of Progress/Problems: Today's group focused on the term Diagnosis.  Participants were asked to define the term, and then pronounce whether it is a negative, positive or neutral term.  Suzanne Kline stayed th entire time.  Active participant.  Stated that she uses an app on her phone that leads her through a guided meditation with ocean sounds in the background.  Uses this when she gets stressed for stress reduction.  Stated she used mindfullness today to deal with the news that she ould not be discharged as she was disappointed and angry.  Looking forward to leaving tomorrow.  She must have some money because she is seemingly unconcerned about where she will be laying her head tomorrow night.  Suzanne Kline, Suzanne Kline 09/01/2015 , 12:39 PM

## 2015-09-01 NOTE — Progress Notes (Signed)
Patient ID: Suzanne Kline, female   DOB: 05/13/1971, 44 y.o.   MRN: [id number] D   Pt. Denies pain and agrees to contract for safety at this time.  She is disappointed that she is not being DCd today,  But is handling it well.  She attends groups with good participation and attention.   Pt. Is app/coop with staff and takes medications as asked.  Pt. Shows no sign of adverse effects to meds.   Pt. States she is having a good day, but looks forward to tomorrow and going home.  --- A ---  Support, encouragement and meds provided.  --- R --  Pt. Remains safe and vested on unit

## 2015-09-01 NOTE — Progress Notes (Signed)
Nutrition Brief Note  RD consulted for diet education for hyperlipidemia.  Patient expected to discharge to homeless shelter tomorrow 12/14 per nursing note. With this living situation, not certain patient can comply with diet at this time.  Wt Readings from Last 15 Encounters:  08/26/15 245 lb (111.131 kg)    Body mass index is 36.16 kg/(m^2). Patient meets criteria for obesity based on current BMI.   Diet Order: Diet regular Room service appropriate?: Yes; Fluid consistency:: Thin Pt is also offered choice of unit snacks mid-morning and mid-afternoon.  Pt is eating as desired.   Labs and medications reviewed.   No nutrition interventions warranted at this time. If nutrition issues arise, please consult RD.   Tilda FrancoLindsey Khalin Royce, MS, RD, LDN Pager: [phone] After Hours Pager: [phone]

## 2015-09-01 NOTE — Progress Notes (Signed)
   D: Pt informed the writer that she plans to discharge to a homeless shelter, then follow up with treatment. Pt asked for her medications a little before 2200, stating, "It's not right to take all that seroquel at 2200, then only get 5 or 6 hrs of sleep".  Pt also informed the writer that she was anxious about the roommates outbursts. Asked the writer to "give her something to knock her out", referring to the roommate. Pt has no other questions or concerns.    A:  Support and encouragement was offered. 15 min checks continued for safety.  R: Pt remains safe.

## 2015-09-01 NOTE — [facility] Suicide Risk Assessment (Signed)
[facility] INPATIENT:  Family/Significant Other Suicide Prevention Education  Suicide Prevention Education:  Patient Refusal for Family/Significant Other Suicide Prevention Education: The patient Suzanne Kline has refused to provide written consent for family/significant other to be provided Family/Significant Other Suicide Prevention Education during admission and/or prior to discharge.  Physician notified.  Daryel Geraldorth, Letanya Froh B 09/01/2015, 8:11 AM
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
## 2015-09-02 MED ORDER — FLUTICASONE PROPIONATE HFA 110 MCG/ACT IN AERO: 2.0000 | INHALATION_SPRAY | Freq: Two times a day (BID) | RESPIRATORY_TRACT | Status: AC | PRN

## 2015-09-02 MED ORDER — QUETIAPINE FUMARATE 300 MG PO TABS: 300.0000 mg | ORAL_TABLET | Freq: Every day | ORAL | Status: DC

## 2015-09-02 MED ORDER — LORATADINE 10 MG PO TABS: 10.0000 mg | ORAL_TABLET | Freq: Every day | ORAL | Status: AC

## 2015-09-02 MED ORDER — OMEPRAZOLE 20 MG PO CPDR: 20.0000 mg | DELAYED_RELEASE_CAPSULE | Freq: Two times a day (BID) | ORAL | Status: DC

## 2015-09-02 MED ORDER — PRAZOSIN HCL 1 MG PO CAPS: 1.0000 mg | ORAL_CAPSULE | Freq: Every day | ORAL | Status: AC

## 2015-09-02 MED ORDER — METHOCARBAMOL 750 MG PO TABS: 750.0000 mg | ORAL_TABLET | Freq: Three times a day (TID) | ORAL | Status: DC

## 2015-09-02 MED ORDER — GABAPENTIN 100 MG PO CAPS: 200.0000 mg | ORAL_CAPSULE | Freq: Three times a day (TID) | ORAL | Status: DC

## 2015-09-02 MED ORDER — LEVETIRACETAM 500 MG PO TABS: 500.0000 mg | ORAL_TABLET | Freq: Every day | ORAL | Status: AC

## 2015-09-02 MED ORDER — LIDOCAINE 5 % EX PTCH
1.0000 | MEDICATED_PATCH | CUTANEOUS | Status: DC
Start: 2015-09-02 — End: 2016-09-23

## 2015-09-02 MED ORDER — FLUPHENAZINE DECANOATE 25 MG/ML IJ SOLN: 25.0000 mg | INTRAMUSCULAR | Status: DC

## 2015-09-02 MED ORDER — QUETIAPINE FUMARATE 25 MG PO TABS: 25.0000 mg | ORAL_TABLET | Freq: Two times a day (BID) | ORAL | Status: DC | PRN

## 2015-09-02 MED ORDER — BACITRACIN-NEOMYCIN-POLYMYXIN 400-5-5000 EX OINT: TOPICAL_OINTMENT | Freq: Three times a day (TID) | CUTANEOUS | Status: DC

## 2015-09-02 MED ORDER — FAMOTIDINE 20 MG PO TABS
20.0000 mg | ORAL_TABLET | Freq: Two times a day (BID) | ORAL | Status: AC
Start: 2015-09-02 — End: ?

## 2015-09-02 MED ORDER — HYDROXYZINE HCL 25 MG PO TABS: 25.0000 mg | ORAL_TABLET | Freq: Four times a day (QID) | ORAL | Status: DC | PRN

## 2015-09-02 MED ORDER — SERTRALINE HCL 100 MG PO TABS: 100.0000 mg | ORAL_TABLET | Freq: Every day | ORAL | Status: AC
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
## 2015-09-02 NOTE — Progress Notes (Signed)
[name] has been up and visible on the unit.  Attending groups.  Interacting with peers.  She denies any SI/HI or A/V hallucinations.  She completed her self inventory and reports that her depression, hopelessness and anxiety are 0/10.  She states that her goal for today is to "leave" and she will accomplish this goal by speaking with MD.  Pt. Denies any pain or discomfort.  D/C instructions and medications reviewed with pt.  Pt. verbalized understanding of medications and d/c instructions.   Prescriptions sent with patient.  She declined samples stating "I can't wait and I have medicare so I am going to [facility] to get my medications."  [name] was hyper going through paperwork and kept telling writer that "I'll just sign the paperwork you don't have to go over it."  Informed her that this is the d/c process.  She did allow RN to complete d/c process.  All belongings (from locker 2--debit cards, ID card, multiple band cards, 8-blank checks, pink wallet and pocketbook ) returned to pt. Q 15 min checks maintained until discharge.  Pt. Left the unit in no apparent distress.
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
## 2015-09-02 NOTE — Progress Notes (Signed)
  [facility] :  Will you be returning to the same living situation after discharge:  No. At discharge, do you have transportation home?: Yes,  bus pass Do you have the ability to pay for your medications: Yes,  states she has MCR, MCD  Release of information consent forms completed and in the chart;  Patient's signature needed at discharge.  Patient to Follow up at: Follow-up Information    Follow up with [facility].   Why:  Call them to find out when they will see you again   Contact information:   `[address]  [place] [336] [phone]      Next level of care provider has [name] Link:no  Patient denies SI/HI: Yes,  yes    Safety Planning and Suicide Prevention discussed: Yes,  yes     Has patient been referred to the Quitline?: Patient refused referral  Patient has been referred for addiction treatment: N/A  Ida Rogueorth, Suzanne Kline B 09/02/2015, 12:36 PM

## 2015-09-02 NOTE — Progress Notes (Signed)
  D: Pt was laying in bed at the start of group. Asked pt if she was planning to attend group. Pt stated, "I've been to group all day. I just want to get my medicine at 2045. However, as Clinical research associatewriter exited the room, the pt followed stating, "I changed my mind because I don't want to stay here any longer", [facility]. Pt has no other questions or concerns.    A:  Support and encouragement was offered. 15 min checks continued for safety.  R: Pt remains safe.

## 2015-09-04 ENCOUNTER — Inpatient Hospital Stay
Admit: 2015-09-04 | Discharge: 2015-09-08 | DRG: 885 | Disposition: A | Discharge status: Home / Self Care | Payer: Medicare Other | Source: Intra-hospital | Attending: Psychiatry | Admitting: Psychiatry

## 2015-09-04 ENCOUNTER — Ambulatory Visit (HOSPITAL_COMMUNITY)
Admission: EM | Admit: 2015-09-04 | Discharge: 2015-09-04 | Disposition: A | Discharge status: Home / Self Care | Payer: No Typology Code available for payment source | Source: Ambulatory Visit | Attending: Emergency Medicine | Admitting: Emergency Medicine

## 2015-09-04 ENCOUNTER — Emergency Department (HOSPITAL_COMMUNITY): Payer: Self-pay

## 2015-09-04 ENCOUNTER — Other Ambulatory Visit: Payer: Self-pay

## 2015-09-04 ENCOUNTER — Emergency Department (HOSPITAL_COMMUNITY)
Admission: EM | Admit: 2015-09-04 | Discharge: 2015-09-04 | Disposition: A | Discharge status: Other Acute Inpatient Hospital | Payer: Self-pay | Attending: Emergency Medicine | Admitting: Emergency Medicine

## 2015-09-04 ENCOUNTER — Encounter (HOSPITAL_COMMUNITY): Payer: Self-pay | Admitting: *Deleted

## 2015-09-04 DIAGNOSIS — Z79899 Other long term (current) drug therapy: Secondary | ICD-10-CM | POA: Insufficient documentation

## 2015-09-04 DIAGNOSIS — Z818 Family history of other mental and behavioral disorders: Secondary | ICD-10-CM

## 2015-09-04 DIAGNOSIS — R45850 Homicidal ideations: Secondary | ICD-10-CM | POA: Diagnosis present

## 2015-09-04 DIAGNOSIS — F17200 Nicotine dependence, unspecified, uncomplicated: Secondary | ICD-10-CM | POA: Insufficient documentation

## 2015-09-04 DIAGNOSIS — R0789 Other chest pain: Secondary | ICD-10-CM | POA: Diagnosis not present

## 2015-09-04 DIAGNOSIS — J45909 Unspecified asthma, uncomplicated: Secondary | ICD-10-CM | POA: Insufficient documentation

## 2015-09-04 DIAGNOSIS — G894 Chronic pain syndrome: Secondary | ICD-10-CM | POA: Diagnosis present

## 2015-09-04 DIAGNOSIS — F251 Schizoaffective disorder, depressive type: Secondary | ICD-10-CM | POA: Diagnosis present

## 2015-09-04 DIAGNOSIS — F329 Major depressive disorder, single episode, unspecified: Secondary | ICD-10-CM | POA: Insufficient documentation

## 2015-09-04 DIAGNOSIS — Z590 Homelessness: Secondary | ICD-10-CM | POA: Diagnosis not present

## 2015-09-04 DIAGNOSIS — G40909 Epilepsy, unspecified, not intractable, without status epilepticus: Secondary | ICD-10-CM | POA: Diagnosis present

## 2015-09-04 DIAGNOSIS — F419 Anxiety disorder, unspecified: Secondary | ICD-10-CM | POA: Insufficient documentation

## 2015-09-04 DIAGNOSIS — Z90710 Acquired absence of both cervix and uterus: Secondary | ICD-10-CM

## 2015-09-04 DIAGNOSIS — K6289 Other specified diseases of anus and rectum: Secondary | ICD-10-CM | POA: Insufficient documentation

## 2015-09-04 DIAGNOSIS — R000 Tachycardia, unspecified: Secondary | ICD-10-CM | POA: Insufficient documentation

## 2015-09-04 DIAGNOSIS — S60222A Contusion of left hand, initial encounter: Secondary | ICD-10-CM | POA: Insufficient documentation

## 2015-09-04 DIAGNOSIS — F17210 Nicotine dependence, cigarettes, uncomplicated: Secondary | ICD-10-CM | POA: Diagnosis present

## 2015-09-04 DIAGNOSIS — E8881 Metabolic syndrome: Secondary | ICD-10-CM | POA: Diagnosis present

## 2015-09-04 DIAGNOSIS — G4700 Insomnia, unspecified: Secondary | ICD-10-CM | POA: Diagnosis present

## 2015-09-04 DIAGNOSIS — F209 Schizophrenia, unspecified: Secondary | ICD-10-CM | POA: Insufficient documentation

## 2015-09-04 DIAGNOSIS — Y998 Other external cause status: Secondary | ICD-10-CM | POA: Insufficient documentation

## 2015-09-04 DIAGNOSIS — F515 Nightmare disorder: Secondary | ICD-10-CM | POA: Diagnosis present

## 2015-09-04 DIAGNOSIS — T7421XA Adult sexual abuse, confirmed, initial encounter: Secondary | ICD-10-CM | POA: Insufficient documentation

## 2015-09-04 DIAGNOSIS — Z888 Allergy status to other drugs, medicaments and biological substances status: Secondary | ICD-10-CM | POA: Diagnosis not present

## 2015-09-04 DIAGNOSIS — R45851 Suicidal ideations: Secondary | ICD-10-CM | POA: Diagnosis present

## 2015-09-04 DIAGNOSIS — Y9289 Other specified places as the place of occurrence of the external cause: Secondary | ICD-10-CM | POA: Insufficient documentation

## 2015-09-04 DIAGNOSIS — F4310 Post-traumatic stress disorder, unspecified: Secondary | ICD-10-CM | POA: Diagnosis present

## 2015-09-04 DIAGNOSIS — E785 Hyperlipidemia, unspecified: Secondary | ICD-10-CM | POA: Diagnosis present

## 2015-09-04 DIAGNOSIS — F1590 Other stimulant use, unspecified, uncomplicated: Secondary | ICD-10-CM

## 2015-09-04 DIAGNOSIS — Z0441 Encounter for examination and observation following alleged adult rape: Secondary | ICD-10-CM | POA: Insufficient documentation

## 2015-09-04 DIAGNOSIS — F603 Borderline personality disorder: Secondary | ICD-10-CM

## 2015-09-04 DIAGNOSIS — K219 Gastro-esophageal reflux disease without esophagitis: Secondary | ICD-10-CM | POA: Diagnosis present

## 2015-09-04 DIAGNOSIS — F333 Major depressive disorder, recurrent, severe with psychotic symptoms: Secondary | ICD-10-CM | POA: Diagnosis not present

## 2015-09-04 DIAGNOSIS — F1410 Cocaine abuse, uncomplicated: Secondary | ICD-10-CM | POA: Insufficient documentation

## 2015-09-04 DIAGNOSIS — Y9389 Activity, other specified: Secondary | ICD-10-CM | POA: Insufficient documentation

## 2015-09-04 DIAGNOSIS — F332 Major depressive disorder, recurrent severe without psychotic features: Principal | ICD-10-CM

## 2015-09-04 DIAGNOSIS — X58XXXA Exposure to other specified factors, initial encounter: Secondary | ICD-10-CM | POA: Insufficient documentation

## 2015-09-04 DIAGNOSIS — R079 Chest pain, unspecified: Secondary | ICD-10-CM | POA: Insufficient documentation

## 2015-09-04 LAB — COMPREHENSIVE METABOLIC PANEL
ALT: 21 U/L (ref 14–54)
AST: 23 U/L (ref 15–41)
Albumin: 4.6 g/dL (ref 3.5–5.0)
Alkaline Phosphatase: 96 U/L (ref 38–126)
Anion gap: 14 (ref 5–15)
BUN: 11 mg/dL (ref 6–20)
CHLORIDE: 102 mmol/L (ref 101–111)
CO2: 23 mmol/L (ref 22–32)
CREATININE: 0.78 mg/dL (ref 0.44–1.00)
Calcium: 10.2 mg/dL (ref 8.9–10.3)
GFR calc non Af Amer: >60 mL/min (ref >60)
Glucose, Bld: 101 mg/dL — ABNORMAL HIGH (ref 65–99)
POTASSIUM: 3.7 mmol/L (ref 3.5–5.1)
SODIUM: 139 mmol/L (ref 135–145)
Total Bilirubin: 0.4 mg/dL (ref 0.3–1.2)
Total Protein: 7.9 g/dL (ref 6.5–8.1)

## 2015-09-04 LAB — I-STAT TROPONIN, ED
TROPONIN I, POC: 0 ng/mL (ref 0.00–0.08)
Troponin i, poc: 0.02 ng/mL (ref 0.00–0.08)

## 2015-09-04 LAB — CBC
HCT: 45.2 % (ref 36.0–46.0)
Hemoglobin: 15.6 g/dL — ABNORMAL HIGH (ref 12.0–15.0)
MCH: 34.2 pg — ABNORMAL HIGH (ref 26.0–34.0)
MCHC: 34.5 g/dL (ref 30.0–36.0)
MCV: 99.1 fL (ref 78.0–100.0)
PLATELETS: 396 10*3/uL (ref 150–400)
RBC: 4.56 MIL/uL (ref 3.87–5.11)
RDW: 13.8 % (ref 11.5–15.5)
WBC: 17.3 10*3/uL — AB (ref 4.0–10.5)

## 2015-09-04 LAB — RAPID URINE DRUG SCREEN, HOSP PERFORMED
Amphetamines: NOT DETECTED
BENZODIAZEPINES: NOT DETECTED
Barbiturates: NOT DETECTED
COCAINE: POSITIVE — AB
OPIATES: NOT DETECTED
Tetrahydrocannabinol: NOT DETECTED

## 2015-09-04 LAB — ACETAMINOPHEN LEVEL

## 2015-09-04 LAB — SALICYLATE LEVEL

## 2015-09-04 LAB — ETHANOL: Alcohol, Ethyl (B): <5 mg/dL (ref <5)

## 2015-09-04 MED ORDER — QUETIAPINE FUMARATE 25 MG PO TABS: 25.0000 mg | ORAL_TABLET | Freq: Two times a day (BID) | ORAL | Status: DC | PRN

## 2015-09-04 MED ORDER — LIDOCAINE 5 % EX PTCH
1.0000 | MEDICATED_PATCH | CUTANEOUS | Status: DC
  Administered 2015-09-04: 1 via TRANSDERMAL
  Filled 2015-09-04: qty 1

## 2015-09-04 MED ORDER — GABAPENTIN 100 MG PO CAPS
200.0000 mg | ORAL_CAPSULE | Freq: Three times a day (TID) | ORAL | Status: DC
  Administered 2015-09-04 (×2): 200 mg via ORAL
  Filled 2015-09-04 (×3): qty 2

## 2015-09-04 MED ORDER — FAMOTIDINE 20 MG PO TABS
20.0000 mg | ORAL_TABLET | Freq: Two times a day (BID) | ORAL | Status: DC
  Administered 2015-09-04: 20 mg via ORAL
  Filled 2015-09-04: qty 1

## 2015-09-04 MED ORDER — HYDROXYZINE HCL 25 MG PO TABS
25.0000 mg | ORAL_TABLET | Freq: Four times a day (QID) | ORAL | Status: DC | PRN
  Filled 2015-09-04: qty 1

## 2015-09-04 MED ORDER — NICOTINE 21 MG/24HR TD PT24
21.0000 mg | MEDICATED_PATCH | Freq: Every day | TRANSDERMAL | Status: DC
  Administered 2015-09-05 – 2015-09-08 (×4): 21 mg via TRANSDERMAL
  Filled 2015-09-04 (×4): qty 1

## 2015-09-04 MED ORDER — ONDANSETRON HCL 4 MG PO TABS: 4.0000 mg | ORAL_TABLET | Freq: Three times a day (TID) | ORAL | Status: DC | PRN

## 2015-09-04 MED ORDER — QUETIAPINE FUMARATE 25 MG PO TABS
25.0000 mg | ORAL_TABLET | Freq: Three times a day (TID) | ORAL | Status: DC
  Administered 2015-09-05 – 2015-09-07 (×7): 25 mg via ORAL
  Filled 2015-09-04 (×7): qty 1

## 2015-09-04 MED ORDER — QUETIAPINE FUMARATE 300 MG PO TABS
300.0000 mg | ORAL_TABLET | Freq: Every day | ORAL | Status: DC
  Administered 2015-09-05 – 2015-09-06 (×2): 300 mg via ORAL
  Filled 2015-09-04 (×2): qty 1

## 2015-09-04 MED ORDER — ONDANSETRON 4 MG PO TBDP
4.0000 mg | ORAL_TABLET | Freq: Once | ORAL | Status: AC
  Administered 2015-09-04: 4 mg via ORAL
  Filled 2015-09-04: qty 1

## 2015-09-04 MED ORDER — CEFTRIAXONE SODIUM 250 MG IJ SOLR
250.0000 mg | Freq: Once | INTRAMUSCULAR | Status: AC
  Administered 2015-09-04: 250 mg via INTRAMUSCULAR
  Filled 2015-09-04: qty 250

## 2015-09-04 MED ORDER — TRAZODONE HCL 100 MG PO TABS
100.0000 mg | ORAL_TABLET | Freq: Every evening | ORAL | Status: DC | PRN
  Administered 2015-09-07: 100 mg via ORAL
  Filled 2015-09-04: qty 1

## 2015-09-04 MED ORDER — PANTOPRAZOLE SODIUM 40 MG PO TBEC
40.0000 mg | DELAYED_RELEASE_TABLET | Freq: Every day | ORAL | Status: DC
  Administered 2015-09-05 – 2015-09-08 (×4): 40 mg via ORAL
  Filled 2015-09-04 (×4): qty 1

## 2015-09-04 MED ORDER — LORATADINE 10 MG PO TABS
10.0000 mg | ORAL_TABLET | Freq: Every day | ORAL | Status: DC
  Administered 2015-09-05 – 2015-09-08 (×4): 10 mg via ORAL
  Filled 2015-09-04 (×4): qty 1

## 2015-09-04 MED ORDER — ACETAMINOPHEN 325 MG PO TABS
650.0000 mg | ORAL_TABLET | ORAL | Status: DC | PRN
  Administered 2015-09-05 – 2015-09-08 (×5): 650 mg via ORAL
  Filled 2015-09-04 (×5): qty 2

## 2015-09-04 MED ORDER — ELVITEG-COBIC-EMTRICIT-TENOFAF 150-150-200-10 MG PO TABS
1.0000 | ORAL_TABLET | Freq: Every day | ORAL | Status: DC
Start: 2015-09-04 — End: 2016-09-23

## 2015-09-04 MED ORDER — LEVETIRACETAM 500 MG PO TABS
500.0000 mg | ORAL_TABLET | Freq: Every day | ORAL | Status: DC
  Administered 2015-09-04: 500 mg via ORAL
  Filled 2015-09-04 (×2): qty 1

## 2015-09-04 MED ORDER — LORAZEPAM 1 MG PO TABS
1.0000 mg | ORAL_TABLET | Freq: Once | ORAL | Status: AC
  Administered 2015-09-04: 1 mg via ORAL
  Filled 2015-09-04: qty 1

## 2015-09-04 MED ORDER — ONDANSETRON 4 MG PO TBDP
8.0000 mg | ORAL_TABLET | Freq: Once | ORAL | Status: AC
  Administered 2015-09-04: 8 mg via ORAL
  Filled 2015-09-04: qty 2

## 2015-09-04 MED ORDER — ELVITEG-COBIC-EMTRICIT-TENOFAF 150-150-200-10 MG PO TABS
1.0000 | ORAL_TABLET | Freq: Every day | ORAL | Status: DC
  Administered 2015-09-05 – 2015-09-08 (×4): 1 via ORAL
  Filled 2015-09-04 (×6): qty 1

## 2015-09-04 MED ORDER — LEVETIRACETAM 500 MG PO TABS
500.0000 mg | ORAL_TABLET | Freq: Two times a day (BID) | ORAL | Status: DC
  Administered 2015-09-05 – 2015-09-06 (×3): 500 mg via ORAL
  Filled 2015-09-04 (×3): qty 1

## 2015-09-04 MED ORDER — METHOCARBAMOL 500 MG PO TABS
750.0000 mg | ORAL_TABLET | Freq: Three times a day (TID) | ORAL | Status: DC
  Administered 2015-09-04 (×2): 750 mg via ORAL
  Filled 2015-09-04 (×2): qty 2

## 2015-09-04 MED ORDER — FLUPHENAZINE DECANOATE 25 MG/ML IJ SOLN: 50.0000 mg | INTRAMUSCULAR | Status: DC

## 2015-09-04 MED ORDER — ACETAMINOPHEN 325 MG PO TABS: 650.0000 mg | ORAL_TABLET | Freq: Four times a day (QID) | ORAL | Status: DC | PRN

## 2015-09-04 MED ORDER — QUETIAPINE FUMARATE 25 MG PO TABS
300.0000 mg | ORAL_TABLET | Freq: Every day | ORAL | Status: DC
  Administered 2015-09-04: 300 mg via ORAL
  Filled 2015-09-04: qty 4

## 2015-09-04 MED ORDER — IBUPROFEN 600 MG PO TABS
600.0000 mg | ORAL_TABLET | Freq: Three times a day (TID) | ORAL | Status: DC | PRN
  Administered 2015-09-05 – 2015-09-06 (×2): 600 mg via ORAL
  Filled 2015-09-04 (×2): qty 1

## 2015-09-04 MED ORDER — SERTRALINE HCL 50 MG PO TABS
100.0000 mg | ORAL_TABLET | Freq: Every day | ORAL | Status: DC
  Administered 2015-09-04: 100 mg via ORAL
  Filled 2015-09-04: qty 2

## 2015-09-04 MED ORDER — AZITHROMYCIN 250 MG PO TABS
1000.0000 mg | ORAL_TABLET | Freq: Once | ORAL | Status: AC
  Administered 2015-09-04: 1000 mg via ORAL
  Filled 2015-09-04: qty 4

## 2015-09-04 MED ORDER — PRAZOSIN HCL 1 MG PO CAPS
1.0000 mg | ORAL_CAPSULE | Freq: Two times a day (BID) | ORAL | Status: DC
  Administered 2015-09-05 – 2015-09-08 (×7): 1 mg via ORAL
  Filled 2015-09-04 (×9): qty 1

## 2015-09-04 MED ORDER — SERTRALINE HCL 100 MG PO TABS
100.0000 mg | ORAL_TABLET | Freq: Every day | ORAL | Status: DC
  Administered 2015-09-05 – 2015-09-08 (×4): 100 mg via ORAL
  Filled 2015-09-04 (×4): qty 1

## 2015-09-04 MED ORDER — GABAPENTIN 100 MG PO CAPS
100.0000 mg | ORAL_CAPSULE | Freq: Three times a day (TID) | ORAL | Status: DC
  Administered 2015-09-05 – 2015-09-07 (×7): 100 mg via ORAL
  Filled 2015-09-04 (×7): qty 1

## 2015-09-04 MED ORDER — LIDOCAINE 5 % EX PTCH
1.0000 | MEDICATED_PATCH | CUTANEOUS | Status: DC
  Administered 2015-09-05 – 2015-09-08 (×4): 1 via TRANSDERMAL
  Filled 2015-09-04 (×4): qty 1

## 2015-09-04 MED ORDER — PRAZOSIN HCL 1 MG PO CAPS
1.0000 mg | ORAL_CAPSULE | Freq: Every day | ORAL | Status: DC
  Administered 2015-09-04: 1 mg via ORAL
  Filled 2015-09-04: qty 1

## 2015-09-04 MED ORDER — FLUTICASONE PROPIONATE HFA 110 MCG/ACT IN AERO
2.0000 | INHALATION_SPRAY | Freq: Two times a day (BID) | RESPIRATORY_TRACT | Status: DC | PRN
  Filled 2015-09-04: qty 12

## 2015-09-04 MED ORDER — LORATADINE 10 MG PO TABS
10.0000 mg | ORAL_TABLET | Freq: Every day | ORAL | Status: DC
  Administered 2015-09-04: 10 mg via ORAL
  Filled 2015-09-04: qty 1

## 2015-09-04 MED ORDER — LIDOCAINE HCL (PF) 1 % IJ SOLN
INTRAMUSCULAR | Status: AC
  Filled 2015-09-04: qty 5

## 2015-09-04 MED ORDER — ACETAMINOPHEN 325 MG PO TABS: 650.0000 mg | ORAL_TABLET | ORAL | Status: DC | PRN

## 2015-09-04 MED ORDER — METRONIDAZOLE 500 MG PO TABS
2000.0000 mg | ORAL_TABLET | Freq: Once | ORAL | Status: AC
  Administered 2015-09-04: 2000 mg via ORAL
  Filled 2015-09-04: qty 4

## 2015-09-04 MED ORDER — ELVITEG-COBIC-EMTRICIT-TENOFAF 150-150-200-10 MG PO TABS: 1.0000 | ORAL_TABLET | Freq: Every day | ORAL | Status: DC

## 2015-09-04 MED ORDER — IBUPROFEN 600 MG PO TABS: 600.0000 mg | ORAL_TABLET | Freq: Three times a day (TID) | ORAL | Status: DC | PRN

## 2015-09-04 MED ORDER — MAGNESIUM HYDROXIDE 400 MG/5ML PO SUSP: 30.0000 mL | Freq: Every day | ORAL | Status: DC | PRN

## 2015-09-04 MED ORDER — NICOTINE 21 MG/24HR TD PT24
21.0000 mg | MEDICATED_PATCH | Freq: Once | TRANSDERMAL | Status: DC
  Administered 2015-09-04: 21 mg via TRANSDERMAL
  Filled 2015-09-04: qty 1

## 2015-09-04 MED ORDER — ALUM & MAG HYDROXIDE-SIMETH 200-200-20 MG/5ML PO SUSP: 30.0000 mL | ORAL | Status: DC | PRN

## 2015-09-04 MED ORDER — PANTOPRAZOLE SODIUM 40 MG PO TBEC
40.0000 mg | DELAYED_RELEASE_TABLET | Freq: Every day | ORAL | Status: DC
  Administered 2015-09-04: 40 mg via ORAL
  Filled 2015-09-04: qty 1

## 2015-09-04 MED ORDER — LORAZEPAM 1 MG PO TABS
2.0000 mg | ORAL_TABLET | Freq: Once | ORAL | Status: AC
  Administered 2015-09-04: 2 mg via ORAL
  Filled 2015-09-04: qty 2

## 2015-09-04 MED ORDER — LIDOCAINE HCL (PF) 1 % IJ SOLN
2.0000 mL | Freq: Once | INTRAMUSCULAR | Status: AC
  Administered 2015-09-04: 2 mL

## 2015-09-04 MED ORDER — HEPATITIS A VACCINE 1440 EL U/ML IM SUSP
1.0000 mL | Freq: Once | INTRAMUSCULAR | Status: AC
  Administered 2015-09-04: 1440 [IU] via INTRAMUSCULAR
  Filled 2015-09-04: qty 1

## 2015-09-04 NOTE — ED Notes (Signed)
Pt updated that SANE nurse enroute

## 2015-09-04 NOTE — ED Notes (Signed)
SANE nurse at bedside.

## 2015-09-04 NOTE — ED Notes (Signed)
Pt ambulatory to restroom
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
## 2015-09-04 NOTE — ED Notes (Signed)
1:1 sitter at bedside. Pt drinking coffee, calm, cooperative.
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
## 2015-09-04 NOTE — ED Notes (Signed)
Assisted pt to the restroom. Ambulatory. Pt placed used toilet paper in paper bag for possible testing with SANE. States she feels less anxious.
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
## 2015-09-04 NOTE — Discharge Planning (Signed)
Per [employer], pt application is approved.  Information for access:  Member number: [id number] Johnney KillianBin number: [id number] PCN :  [id number] Group number:  [id number]  Please provide this information on pt discharge paperwork so she may obtain her meds.
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
## 2015-09-04 NOTE — ED Provider Notes (Signed)
  Physical Exam  BP 135/83 mmHg  Pulse 96  Temp(Src) 98.2 F (36.8 C) (Oral)  Resp 16  SpO2 99%  Physical Exam  ED Course  Procedures  MDM Accepted at [facility] by Dr Ardyth HarpsHernandez. Patient reevaluated      Benjiman CoreNathan Darrow Barreiro, MD 09/04/15 [phone]
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
## 2015-09-04 NOTE — ED Notes (Signed)
Pt ate full meal. No complaints. Updated on POC and agrees. 1:1 sitter at bedside.

## 2015-09-04 NOTE — ED Notes (Signed)
Attempted report 

## 2015-09-04 NOTE — ED Notes (Signed)
TTS machine in room with pt and sitter for TTS eval

## 2015-09-04 NOTE — Progress Notes (Addendum)
[facility] is voluntary and [facility]. Voluntary consent paper was [facility] at [phone]. [facility] and to fax them to Olivia Mackieressa [facility] once pt signed papers.   Melbourne Abtsatia Derald Lorge, LCSWA Disposition staff 09/04/2015 6:40 PM

## 2015-09-04 NOTE — Progress Notes (Signed)
This Clinical research associatewriter reviewed the referral for potential inpatient placement. The patient was accepted by Dr. Toni Amendlapacs. The patient will have bed 319B and attending is Dr Ardyth HarpsHernandez.  Patient is currently voluntary and per medical staff patient has verbally agreed [facility].  Call report to [phone].  Patient can be transferred after 7pm.    Maryelizabeth Rowanressa Vienna Folden, MSW, Clare CharonLCSW, LCAS [facility] Triage Specialist [phone]

## 2015-09-05 DIAGNOSIS — F251 Schizoaffective disorder, depressive type: Secondary | ICD-10-CM

## 2015-09-05 LAB — URINE DRUG SCREEN, QUALITATIVE (ARMC ONLY)
Amphetamines, Ur Screen: NOT DETECTED
BARBITURATES, UR SCREEN: NOT DETECTED
BENZODIAZEPINE, UR SCRN: POSITIVE — AB
CANNABINOID 50 NG, UR ~~LOC~~: NOT DETECTED
Cocaine Metabolite,Ur ~~LOC~~: POSITIVE — AB
MDMA (Ecstasy)Ur Screen: NOT DETECTED
Methadone Scn, Ur: NOT DETECTED
Opiate, Ur Screen: NOT DETECTED
PHENCYCLIDINE (PCP) UR S: NOT DETECTED
TRICYCLIC, UR SCREEN: POSITIVE — AB

## 2015-09-05 LAB — URINALYSIS COMPLETE WITH MICROSCOPIC (ARMC ONLY)
BACTERIA UA: NONE SEEN
Bilirubin Urine: NEGATIVE
Glucose, UA: NEGATIVE mg/dL
HGB URINE DIPSTICK: NEGATIVE
Ketones, ur: NEGATIVE mg/dL
LEUKOCYTES UA: NEGATIVE
Nitrite: NEGATIVE
PROTEIN: NEGATIVE mg/dL
SPECIFIC GRAVITY, URINE: 1.01 (ref 1.005–1.030)
pH: 5 (ref 5.0–8.0)

## 2015-09-05 LAB — HIV ANTIBODY (ROUTINE TESTING W REFLEX): HIV Screen 4th Generation wRfx: NONREACTIVE

## 2015-09-05 LAB — PREGNANCY, URINE: PREG TEST UR: NEGATIVE

## 2015-09-05 MED ORDER — CLONAZEPAM 0.5 MG PO TABS
0.5000 mg | ORAL_TABLET | Freq: Three times a day (TID) | ORAL | Status: DC
  Administered 2015-09-05 – 2015-09-06 (×3): 0.5 mg via ORAL
  Filled 2015-09-05 (×3): qty 1

## 2015-09-05 MED ORDER — TRAMADOL HCL 50 MG PO TABS
50.0000 mg | ORAL_TABLET | Freq: Four times a day (QID) | ORAL | Status: DC | PRN
  Administered 2015-09-05 – 2015-09-07 (×6): 50 mg via ORAL
  Filled 2015-09-05 (×6): qty 1

## 2015-09-05 MED ORDER — THIORIDAZINE HCL 10 MG PO TABS
10.0000 mg | ORAL_TABLET | Freq: Two times a day (BID) | ORAL | Status: DC
  Administered 2015-09-05 – 2015-09-07 (×5): 10 mg via ORAL
  Filled 2015-09-05 (×6): qty 1
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
## 2015-09-05 NOTE — Progress Notes (Signed)
Processed this mornings event with patient and she states " I need to apoligize" for my behavior. When questioned patient states she will "still be able to work with Clinical research associatewriter". Satety maintained. Patient calm and cooperative eating lunch.

## 2015-09-05 NOTE — Progress Notes (Signed)
Patient reports to staff that she "wants to punch writer in the mouth".

## 2015-09-05 NOTE — Plan of Care (Signed)
Problem: [facility] Patient Education Outcome: Progressing Patient labile and has 1 episode of self harming behaviors this morning, is placed on 1:1 with staff.
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
## 2015-09-05 NOTE — Progress Notes (Signed)
Writer informed patient engaging in staff splitting behaviors. Talking about staff calling them names, cursing them and recommending them to support groups of Weight watchers etc. Limits set.

## 2015-09-05 NOTE — Progress Notes (Signed)
Urine sample obtained with results pending. Charge Nurse and MD into visit patient. Supervisor aware.

## 2015-09-05 NOTE — Progress Notes (Signed)
Patient requested to shave chin this morning, unable to supervise this task rt patient cutting self harming with pen.
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
## 2015-09-05 NOTE — [facility] Group Notes (Signed)
[facility] LCSW Group Therapy  09/05/2015 11:38 AM  Type of Therapy:  Group Therapy  Participation Level:  Did Not Attend  Modes of Intervention:  Discussion, Education, Socialization and Support  Summary of Progress/Problems: Balance in life: Patients will discuss the concept of balance and how it looks and feels to be unbalanced. Pt will identify areas in their life that is unbalanced and ways to become more balanced.    Sue Mcalexander L Trishna Cwik MSW, LCSWA  09/05/2015, 11:38 AM  

## 2015-09-05 NOTE — Progress Notes (Signed)
Patient to nurse's station has a unit pen in her hand, patient has self harming behavior and has scratched wrist with unit pen. Area cleansed and bandaid applied, patient swats at Clinical research associatewriter when Clinical research associatewriter attempting to apply second bandaid".  Patient states "voices told her to cut her self". MD aware and to see patient. One on one sitter to stay with patient to increase safety.
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
## 2015-09-06 MED ORDER — CLONAZEPAM 1 MG PO TABS
1.0000 mg | ORAL_TABLET | Freq: Three times a day (TID) | ORAL | Status: DC
  Administered 2015-09-06 – 2015-09-07 (×3): 1 mg via ORAL
  Filled 2015-09-06 (×3): qty 1

## 2015-09-06 MED ORDER — LEVETIRACETAM 750 MG PO TABS
750.0000 mg | ORAL_TABLET | Freq: Two times a day (BID) | ORAL | Status: DC
  Administered 2015-09-06 – 2015-09-08 (×4): 750 mg via ORAL
  Filled 2015-09-06 (×5): qty 1

## 2015-09-06 MED ORDER — LOPERAMIDE HCL 2 MG PO CAPS
2.0000 mg | ORAL_CAPSULE | ORAL | Status: DC | PRN
  Administered 2015-09-06 – 2015-09-07 (×3): 2 mg via ORAL
  Filled 2015-09-06 (×3): qty 1

## 2015-09-06 MED ORDER — METHOCARBAMOL 750 MG PO TABS
750.0000 mg | ORAL_TABLET | Freq: Three times a day (TID) | ORAL | Status: DC
  Administered 2015-09-06 – 2015-09-07 (×5): 750 mg via ORAL
  Filled 2015-09-06 (×5): qty 1

## 2015-09-06 NOTE — Progress Notes (Signed)
   09/06/15 2000  Clinical Encounter Type  Visited With Patient;Health care provider  Visit Type Follow-up  Referral From Nurse  Consult/Referral To Chaplain  Spiritual Encounters  Spiritual Needs Prayer;Emotional  Visited with patient and 1:1 sitter in pt's room.  Provided pastoral support, presence and prayer.  [employer] GroupChaplain Nelia Rogoff-pager [phone]

## 2015-09-06 NOTE — Plan of Care (Signed)
Problem: Ineffective individual coping Goal: LTG: Patient will report a decrease in negative feelings Outcome: Progressing Patient actively participating in plan of care. Patient meets with social worker and women from resource center to discuss possible placement at discharge time.

## 2015-09-06 NOTE — [facility] Group Notes (Signed)
[facility] LCSW Group Therapy  09/06/2015 3:08 PM  Type of Therapy:  Group Therapy  Participation Level:  Did Not Attend  Modes of Intervention:  Discussion, Education, Socialization and Support  Summary of Progress/Problems: Pt will identify unhealthy thoughts and how they impact their emotions and behavior. Pt will be encouraged to discuss these thoughts, emotions and behaviors with the group.   Khali Perella L Monica Zahler MSW, LCSWA  09/06/2015, 3:08 PM 

## 2015-09-06 NOTE — SANE Note (Signed)
Case number [id number].

## 2015-09-06 NOTE — Progress Notes (Signed)
Patient refuses to do self inventory audit, rolls paper into a ball and throws in a cup of water.

## 2015-09-06 NOTE — Progress Notes (Signed)
   09/06/15 1700  Clinical Encounter Type  Visited With Patient not available  Visit Type Initial  Referral From Nurse  Consult/Referral To Chaplain  Paged to unit from nurse.  Pt requested to speak to a chaplain. When I arrived on unit nursing staff said pt was eating and did not want to speak to me at that time.  I told staff that I would try to come back later this evening, but it depending on the on call pager as I am only chaplain here.  If I am unable to visit tonight, I will pass onto unit chaplain tomorrow morning.  [employer] GroupChaplain Liona Wengert-pager [phone]
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
## 2015-09-06 NOTE — Plan of Care (Signed)
Problem: Diagnosis: Increased Risk For Suicide Attempt Goal: STG-Patient Will Comply With Medication Regime Outcome: Progressing Pt compliant with medications     
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
## 2015-09-07 ENCOUNTER — Encounter: Payer: Self-pay | Admitting: Psychiatry

## 2015-09-07 DIAGNOSIS — F333 Major depressive disorder, recurrent, severe with psychotic symptoms: Secondary | ICD-10-CM

## 2015-09-07 DIAGNOSIS — F603 Borderline personality disorder: Secondary | ICD-10-CM

## 2015-09-07 DIAGNOSIS — R45850 Homicidal ideations: Secondary | ICD-10-CM

## 2015-09-07 DIAGNOSIS — F332 Major depressive disorder, recurrent severe without psychotic features: Secondary | ICD-10-CM

## 2015-09-07 DIAGNOSIS — R45851 Suicidal ideations: Secondary | ICD-10-CM

## 2015-09-07 DIAGNOSIS — G894 Chronic pain syndrome: Secondary | ICD-10-CM

## 2015-09-07 DIAGNOSIS — F1590 Other stimulant use, unspecified, uncomplicated: Secondary | ICD-10-CM

## 2015-09-07 MED ORDER — QUETIAPINE FUMARATE ER 200 MG PO TB24
400.0000 mg | ORAL_TABLET | Freq: Every day | ORAL | Status: DC
  Administered 2015-09-07: 400 mg via ORAL
  Filled 2015-09-07: qty 2

## 2015-09-07 MED ORDER — CLONAZEPAM 0.5 MG PO TABS: 0.5000 mg | ORAL_TABLET | Freq: Three times a day (TID) | ORAL | Status: DC

## 2015-09-07 MED ORDER — CHLORDIAZEPOXIDE HCL 25 MG PO CAPS
25.0000 mg | ORAL_CAPSULE | Freq: Three times a day (TID) | ORAL | Status: DC
  Administered 2015-09-07 – 2015-09-08 (×4): 25 mg via ORAL
  Filled 2015-09-07 (×4): qty 1

## 2015-09-07 MED ORDER — GABAPENTIN 300 MG PO CAPS: 300.0000 mg | ORAL_CAPSULE | Freq: Three times a day (TID) | ORAL | Status: DC

## 2015-09-07 MED ORDER — GABAPENTIN 400 MG PO CAPS
400.0000 mg | ORAL_CAPSULE | Freq: Three times a day (TID) | ORAL | Status: DC
  Administered 2015-09-07 – 2015-09-08 (×4): 400 mg via ORAL
  Filled 2015-09-07 (×4): qty 1

## 2015-09-07 MED ORDER — MELOXICAM 7.5 MG PO TABS
15.0000 mg | ORAL_TABLET | Freq: Every day | ORAL | Status: DC
Start: 2015-09-07 — End: 2015-09-08
  Administered 2015-09-07 – 2015-09-08 (×2): 15 mg via ORAL
  Filled 2015-09-07 (×2): qty 2

## 2015-09-07 NOTE — Plan of Care (Signed)
Problem: Ineffective individual coping Goal: STG: Pt will be able to identify effective and ineffective STG: Pt will be able to identify effective and ineffective coping patterns  Outcome: Not Progressing Patient limited  Insight into behavior unable to identify what is positive/ negative

## 2015-09-07 NOTE — Progress Notes (Signed)
D: Argumentative with staff during shift . Upset that she is not getting the medication she had previously on the outside .  Noted to curse at he  MD about her medications . No unit programing , ADL'S . Appetite good and voice no other concerns t .Stated appetite is good and energy level  Is normal. Stated concentration is good . No pain concerns .  A: Encourage patient participation with unit programming . Instruction  Given on  Medication , verbalize understanding. R: Voice no other concerns. Staff continue to monitor
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
## 2015-09-07 NOTE — [facility] Group Notes (Signed)
[facility] LCSW Group Therapy   09/07/2015 1:15 pm  Type of Therapy: Group Therapy   Participation Level: Did Not Attend. Patient invited to participate but declined.    Shawn Dannenberg F. Kieon Lawhorn, MSW, LCSWA, LCAS   

## 2015-09-07 NOTE — Plan of Care (Signed)
Problem: Alteration in thought process Goal: LTG-Patient behavior demonstrates decreased signs psychosis (Patient behavior demonstrates decreased signs of psychosis to the point the patient is safe to return home and continue treatment in an outpatient setting.)  Outcome: Progressing Pt denies AVH at this time. Does not appear to be responding to internal stimuli.  Problem: Diagnosis: Increased Risk For Suicide Attempt Goal: STG-Patient Will Attend All Groups On The Unit Outcome: Not Progressing Pt has not been attending groups on the unit Goal: STG-Patient Will Comply With Medication Regime Outcome: Progressing Pt taking medications as prescribed
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
## 2015-09-07 NOTE — [facility] Group Notes (Signed)
[facility] LCSW Aftercare Discharge Planning Group Note  09/07/2015 9:15 AM  Participation Quality: Did Not Attend. Patient invited to participate but declined.   Suzanne Kline F. Asiah Befort, MSW, LCSWA, LCAS   

## 2015-09-07 NOTE — Discharge Planning (Signed)
Per Gery Pray[employer]ry of [employer], pt application is approved. Information for access:  Member number: [id number] Johnney KillianBin number: [id number] PCN : [id number] Group number: [id number]  Please provide this information on pt discharge paperwork so she may obtain her meds.  She must [facility] meds [facility] on [address] in [place], [place]

## 2015-09-08 LAB — GLUCOSE, CAPILLARY: Glucose-Capillary: 125 mg/dL — ABNORMAL HIGH (ref 65–99)

## 2015-09-08 MED ORDER — GABAPENTIN 400 MG PO CAPS: 400.0000 mg | ORAL_CAPSULE | Freq: Three times a day (TID) | ORAL | Status: AC

## 2015-09-08 MED ORDER — METHOCARBAMOL 500 MG PO TABS
500.0000 mg | ORAL_TABLET | Freq: Three times a day (TID) | ORAL | Status: DC | PRN
  Administered 2015-09-08: 500 mg via ORAL
  Filled 2015-09-08 (×2): qty 1

## 2015-09-08 MED ORDER — TRAZODONE HCL 100 MG PO TABS: 100.0000 mg | ORAL_TABLET | Freq: Every evening | ORAL | Status: DC | PRN

## 2015-09-08 MED ORDER — METHOCARBAMOL 750 MG PO TABS: 750.0000 mg | ORAL_TABLET | Freq: Three times a day (TID) | ORAL | Status: DC | PRN

## 2015-09-08 MED ORDER — MELOXICAM 15 MG PO TABS: 15.0000 mg | ORAL_TABLET | Freq: Every day | ORAL | Status: DC

## 2015-09-08 MED ORDER — QUETIAPINE FUMARATE ER 300 MG PO TB24: 300.0000 mg | ORAL_TABLET | Freq: Every day | ORAL | Status: DC

## 2015-09-08 MED ORDER — QUETIAPINE FUMARATE ER 50 MG PO TB24: 300.0000 mg | ORAL_TABLET | Freq: Every day | ORAL | Status: DC

## 2015-09-08 NOTE — [facility] Group Notes (Signed)
[facility] Group Notes:  (Nursing/MHT/Case Management/Adjunct)  Date:  09/08/2015  Time:  1:27 AM  Type of Therapy:  Group Therapy  Participation Level:  Did Not Attend   Summary of Progress/Problems:  Veva Holesshley Imani Shereece Wellborn 09/08/2015, 1:27 AM
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
## 2015-09-08 NOTE — [facility] Group Notes (Signed)
[facility] LCSW Group Therapy   09/08/2015 11:00 am  Type of Therapy: Group Therapy   Participation Level: Did Not Attend. Patient invited to participate but declined.    Fabiano Ginley F. Mickayla Trouten, MSW, LCSWA, LCAS   

## 2015-09-08 NOTE — [facility] Suicide Risk Assessment (Signed)
[facility] INPATIENT:  Family/Significant Other Suicide Prevention Education  Suicide Prevention Education:  Patient Refusal for Family/Significant Other Suicide Prevention Education: The patient Suzanne Kline has refused to provide written consent for family/significant other to be provided Family/Significant Other Suicide Prevention Education during admission and/or prior to discharge.  Physician notified.  Glennon MacLaws, Jamaurion Slemmer P, MSW, LCSW 09/08/2015, 4:27 PM
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
## 2015-09-08 NOTE — Progress Notes (Addendum)
D: Patient sitting at table this am  Finishing up her breakfast when she fell back onto the floor Patient reports the left side of her face has a knot at the hair line . Writer observed no bruising . Able to tell writer where she was and her name.Patient noted to jerk on left side as Clinical research associatewriter proceded to get patient up patient noted whole body jerking stoped after getting into chair  Patient  Noted to move about freely on unit  With out any concerns A: Encourage patient participation with unit programming . Instruction  Given on  Medication , verbalize understanding. R: Voice no other concerns. Staff continue to monitor

## 2015-09-08 NOTE — Plan of Care (Signed)
Problem: Alteration in thought process Goal: LTG-Patient behavior demonstrates decreased signs psychosis (Patient behavior demonstrates decreased signs of psychosis to the point the patient is safe to return home and continue treatment in an outpatient setting.)  Outcome: Not Progressing Pt endorses auditory hallucinations but denies visual hallucinations.
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
## 2015-09-08 NOTE — Progress Notes (Signed)
Rapid response called post fall.  Upon arrival, pt sitting in wheelchair, somewhat drowsy but oriented and able to follow commands.  Vitals signs stable on room air, no respiratory distress.  Blood sugar being checked.  Recommend that nursing contact physician and infer about CT head as precaution.  Pt remains in room as hemodynamically stable.

## 2015-09-20 DIAGNOSIS — G894 Chronic pain syndrome: Secondary | ICD-10-CM | POA: Insufficient documentation

## 2015-10-05 ENCOUNTER — Ambulatory Visit: Payer: Self-pay | Admitting: Family Medicine

## 2015-10-08 DIAGNOSIS — F4310 Post-traumatic stress disorder, unspecified: Secondary | ICD-10-CM | POA: Diagnosis present
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
## 2016-09-20 ENCOUNTER — Encounter (HOSPITAL_COMMUNITY): Payer: Self-pay | Admitting: Family Medicine

## 2016-09-20 ENCOUNTER — Inpatient Hospital Stay (HOSPITAL_COMMUNITY)
Admission: EM | Admit: 2016-09-20 | Discharge: 2016-09-23 | DRG: 918 | Disposition: A | Discharge status: Other Acute Inpatient Hospital | Payer: Medicare Other | Attending: Internal Medicine | Admitting: Internal Medicine

## 2016-09-20 DIAGNOSIS — Z9114 Patient's other noncompliance with medication regimen: Secondary | ICD-10-CM

## 2016-09-20 DIAGNOSIS — E860 Dehydration: Secondary | ICD-10-CM | POA: Diagnosis present

## 2016-09-20 DIAGNOSIS — R6889 Other general symptoms and signs: Secondary | ICD-10-CM

## 2016-09-20 DIAGNOSIS — Z91148 Patient's other noncompliance with medication regimen for other reason: Secondary | ICD-10-CM

## 2016-09-20 DIAGNOSIS — T50902A Poisoning by unspecified drugs, medicaments and biological substances, intentional self-harm, initial encounter: Secondary | ICD-10-CM | POA: Diagnosis not present

## 2016-09-20 DIAGNOSIS — R45850 Homicidal ideations: Secondary | ICD-10-CM

## 2016-09-20 DIAGNOSIS — R197 Diarrhea, unspecified: Secondary | ICD-10-CM

## 2016-09-20 DIAGNOSIS — Z9119 Patient's noncompliance with other medical treatment and regimen: Secondary | ICD-10-CM

## 2016-09-20 DIAGNOSIS — F333 Major depressive disorder, recurrent, severe with psychotic symptoms: Secondary | ICD-10-CM | POA: Diagnosis not present

## 2016-09-20 DIAGNOSIS — F259 Schizoaffective disorder, unspecified: Secondary | ICD-10-CM | POA: Diagnosis present

## 2016-09-20 DIAGNOSIS — F332 Major depressive disorder, recurrent severe without psychotic features: Secondary | ICD-10-CM | POA: Diagnosis present

## 2016-09-20 DIAGNOSIS — F419 Anxiety disorder, unspecified: Secondary | ICD-10-CM | POA: Diagnosis present

## 2016-09-20 DIAGNOSIS — F603 Borderline personality disorder: Secondary | ICD-10-CM | POA: Diagnosis present

## 2016-09-20 DIAGNOSIS — Z720 Tobacco use: Secondary | ICD-10-CM

## 2016-09-20 DIAGNOSIS — R45851 Suicidal ideations: Secondary | ICD-10-CM

## 2016-09-20 DIAGNOSIS — F1410 Cocaine abuse, uncomplicated: Secondary | ICD-10-CM | POA: Diagnosis present

## 2016-09-20 DIAGNOSIS — Z7951 Long term (current) use of inhaled steroids: Secondary | ICD-10-CM

## 2016-09-20 DIAGNOSIS — Z915 Personal history of self-harm: Secondary | ICD-10-CM

## 2016-09-20 DIAGNOSIS — F10239 Alcohol dependence with withdrawal, unspecified: Secondary | ICD-10-CM | POA: Diagnosis present

## 2016-09-20 DIAGNOSIS — J449 Chronic obstructive pulmonary disease, unspecified: Secondary | ICD-10-CM | POA: Diagnosis present

## 2016-09-20 DIAGNOSIS — G40909 Epilepsy, unspecified, not intractable, without status epilepticus: Secondary | ICD-10-CM | POA: Diagnosis present

## 2016-09-20 DIAGNOSIS — F1010 Alcohol abuse, uncomplicated: Secondary | ICD-10-CM

## 2016-09-20 DIAGNOSIS — G4700 Insomnia, unspecified: Secondary | ICD-10-CM | POA: Diagnosis present

## 2016-09-20 DIAGNOSIS — R339 Retention of urine, unspecified: Secondary | ICD-10-CM | POA: Diagnosis present

## 2016-09-20 DIAGNOSIS — F1590 Other stimulant use, unspecified, uncomplicated: Secondary | ICD-10-CM | POA: Diagnosis present

## 2016-09-20 DIAGNOSIS — F1910 Other psychoactive substance abuse, uncomplicated: Secondary | ICD-10-CM

## 2016-09-20 DIAGNOSIS — G894 Chronic pain syndrome: Secondary | ICD-10-CM | POA: Diagnosis present

## 2016-09-20 DIAGNOSIS — Z7289 Other problems related to lifestyle: Secondary | ICD-10-CM

## 2016-09-20 DIAGNOSIS — T43592A Poisoning by other antipsychotics and neuroleptics, intentional self-harm, initial encounter: Secondary | ICD-10-CM | POA: Diagnosis not present

## 2016-09-20 DIAGNOSIS — R4182 Altered mental status, unspecified: Secondary | ICD-10-CM | POA: Diagnosis not present

## 2016-09-20 DIAGNOSIS — R112 Nausea with vomiting, unspecified: Secondary | ICD-10-CM

## 2016-09-20 DIAGNOSIS — F17200 Nicotine dependence, unspecified, uncomplicated: Secondary | ICD-10-CM | POA: Diagnosis present

## 2016-09-20 DIAGNOSIS — F4310 Post-traumatic stress disorder, unspecified: Secondary | ICD-10-CM | POA: Diagnosis present

## 2016-09-20 DIAGNOSIS — F1310 Sedative, hypnotic or anxiolytic abuse, uncomplicated: Secondary | ICD-10-CM | POA: Diagnosis present

## 2016-09-20 LAB — RAPID URINE DRUG SCREEN, HOSP PERFORMED
Amphetamines: NOT DETECTED
Barbiturates: NOT DETECTED
Benzodiazepines: NOT DETECTED
Cocaine: POSITIVE — AB
Opiates: NOT DETECTED
Tetrahydrocannabinol: POSITIVE — AB

## 2016-09-20 LAB — URINALYSIS, ROUTINE W REFLEX MICROSCOPIC
BILIRUBIN URINE: NEGATIVE
Glucose, UA: NEGATIVE mg/dL
Hgb urine dipstick: NEGATIVE
KETONES UR: 5 mg/dL — AB
Leukocytes, UA: NEGATIVE
NITRITE: NEGATIVE
PROTEIN: NEGATIVE mg/dL
Specific Gravity, Urine: 1.018 (ref 1.005–1.030)
pH: 5 (ref 5.0–8.0)

## 2016-09-20 LAB — CBC WITH DIFFERENTIAL/PLATELET
BASOS ABS: 0 10*3/uL (ref 0.0–0.1)
BASOS PCT: 0 %
EOS PCT: 1 %
Eosinophils Absolute: 0.1 10*3/uL (ref 0.0–0.7)
HEMATOCRIT: 45.6 % (ref 36.0–46.0)
Hemoglobin: 15.8 g/dL — ABNORMAL HIGH (ref 12.0–15.0)
LYMPHS PCT: 39 %
Lymphs Abs: 5.4 10*3/uL — ABNORMAL HIGH (ref 0.7–4.0)
MCH: 34.6 pg — AB (ref 26.0–34.0)
MCHC: 34.6 g/dL (ref 30.0–36.0)
MCV: 100 fL (ref 78.0–100.0)
MONOS PCT: 6 %
Monocytes Absolute: 0.8 10*3/uL (ref 0.1–1.0)
NEUTROS ABS: 7.6 10*3/uL (ref 1.7–7.7)
Neutrophils Relative %: 54 %
Platelets: 378 10*3/uL (ref 150–400)
RBC: 4.56 MIL/uL (ref 3.87–5.11)
RDW: 13.3 % (ref 11.5–15.5)
WBC: 13.9 10*3/uL — ABNORMAL HIGH (ref 4.0–10.5)

## 2016-09-20 LAB — COMPREHENSIVE METABOLIC PANEL
ALT: 17 U/L (ref 14–54)
ALT: 14 U/L (ref 14–54)
AST: 18 U/L (ref 15–41)
AST: 21 U/L (ref 15–41)
Albumin: 4.3 g/dL (ref 3.5–5.0)
Albumin: 3.3 g/dL — ABNORMAL LOW (ref 3.5–5.0)
Alkaline Phosphatase: 106 U/L (ref 38–126)
Alkaline Phosphatase: 82 U/L (ref 38–126)
Anion gap: 11 (ref 5–15)
Anion gap: 9 (ref 5–15)
BUN: 9 mg/dL (ref 6–20)
BUN: 11 mg/dL (ref 6–20)
CO2: 23 mmol/L (ref 22–32)
CO2: 21 mmol/L — ABNORMAL LOW (ref 22–32)
Calcium: 9.2 mg/dL (ref 8.9–10.3)
Calcium: 8.4 mg/dL — ABNORMAL LOW (ref 8.9–10.3)
Chloride: 108 mmol/L (ref 101–111)
Chloride: 110 mmol/L (ref 101–111)
Creatinine, Ser: 0.63 mg/dL (ref 0.44–1.00)
Creatinine, Ser: 0.69 mg/dL (ref 0.44–1.00)
GFR calc Af Amer: >60 mL/min (ref >60)
GFR calc Af Amer: >60 mL/min (ref >60)
GFR calc non Af Amer: >60 mL/min (ref >60)
GFR calc non Af Amer: >60 mL/min (ref >60)
Glucose, Bld: 77 mg/dL (ref 65–99)
Glucose, Bld: 81 mg/dL (ref 65–99)
Potassium: 4.2 mmol/L (ref 3.5–5.1)
Potassium: 4.7 mmol/L (ref 3.5–5.1)
Sodium: 142 mmol/L (ref 135–145)
Sodium: 140 mmol/L (ref 135–145)
Total Bilirubin: 0.3 mg/dL (ref 0.3–1.2)
Total Bilirubin: 1.5 mg/dL — ABNORMAL HIGH (ref 0.3–1.2)
Total Protein: 7.7 g/dL (ref 6.5–8.1)
Total Protein: 6.5 g/dL (ref 6.5–8.1)

## 2016-09-20 LAB — LIPASE, BLOOD: Lipase: 24 U/L (ref 11–51)

## 2016-09-20 LAB — ACETAMINOPHEN LEVEL: Acetaminophen (Tylenol), Serum: <10 ug/mL — ABNORMAL LOW (ref 10–30)

## 2016-09-20 LAB — ETHANOL: Alcohol, Ethyl (B): 24 mg/dL — ABNORMAL HIGH (ref <5)

## 2016-09-20 LAB — PREGNANCY, URINE: Preg Test, Ur: NEGATIVE

## 2016-09-20 LAB — SALICYLATE LEVEL: Salicylate Lvl: <7 mg/dL (ref 2.8–30.0)

## 2016-09-20 MED ORDER — LORAZEPAM 1 MG PO TABS
0.0000 mg | ORAL_TABLET | Freq: Four times a day (QID) | ORAL | Status: DC
  Administered 2016-09-21: 2 mg via ORAL
  Administered 2016-09-21: 1 mg via ORAL
  Filled 2016-09-20 (×2): qty 2

## 2016-09-20 MED ORDER — LORAZEPAM 1 MG PO TABS
1.0000 mg | ORAL_TABLET | Freq: Four times a day (QID) | ORAL | Status: DC | PRN
Start: 2016-09-20 — End: 2016-09-21
  Administered 2016-09-21: 1 mg via ORAL
  Filled 2016-09-20: qty 1

## 2016-09-20 MED ORDER — ONDANSETRON HCL 4 MG/2ML IJ SOLN
4.0000 mg | Freq: Once | INTRAMUSCULAR | Status: AC
  Administered 2016-09-20: 4 mg via INTRAVENOUS
  Filled 2016-09-20: qty 2

## 2016-09-20 MED ORDER — LORAZEPAM 1 MG PO TABS: 0.0000 mg | ORAL_TABLET | Freq: Two times a day (BID) | ORAL | Status: DC

## 2016-09-20 MED ORDER — FOLIC ACID 1 MG PO TABS
1.0000 mg | ORAL_TABLET | Freq: Every day | ORAL | Status: DC
  Administered 2016-09-21 – 2016-09-23 (×3): 1 mg via ORAL
  Filled 2016-09-20 (×3): qty 1

## 2016-09-20 MED ORDER — ALBUTEROL SULFATE (2.5 MG/3ML) 0.083% IN NEBU: 2.5000 mg | INHALATION_SOLUTION | RESPIRATORY_TRACT | Status: DC | PRN

## 2016-09-20 MED ORDER — ADULT MULTIVITAMIN W/MINERALS CH
1.0000 | ORAL_TABLET | Freq: Every day | ORAL | Status: DC
  Administered 2016-09-21 – 2016-09-23 (×3): 1 via ORAL
  Filled 2016-09-20 (×3): qty 1

## 2016-09-20 MED ORDER — ALBUTEROL SULFATE HFA 108 (90 BASE) MCG/ACT IN AERS: 1.0000 | INHALATION_SPRAY | RESPIRATORY_TRACT | Status: DC | PRN

## 2016-09-20 MED ORDER — THIAMINE HCL 100 MG/ML IJ SOLN
100.0000 mg | Freq: Every day | INTRAMUSCULAR | Status: DC
  Administered 2016-09-20: 100 mg via INTRAVENOUS
  Filled 2016-09-20: qty 2

## 2016-09-20 MED ORDER — LORAZEPAM 2 MG/ML IJ SOLN: 2.0000 mg | INTRAMUSCULAR | Status: DC | PRN

## 2016-09-20 MED ORDER — SODIUM CHLORIDE 0.9 % IV SOLN
INTRAVENOUS | Status: AC
  Administered 2016-09-20: 19:00:00 via INTRAVENOUS

## 2016-09-20 MED ORDER — SODIUM CHLORIDE 0.9 % IV SOLN
1000.0000 mg | Freq: Once | INTRAVENOUS | Status: AC
  Administered 2016-09-20: 1000 mg via INTRAVENOUS
  Filled 2016-09-20: qty 10

## 2016-09-20 MED ORDER — SODIUM CHLORIDE 0.9% FLUSH
3.0000 mL | Freq: Two times a day (BID) | INTRAVENOUS | Status: DC
  Administered 2016-09-21 – 2016-09-23 (×4): 3 mL via INTRAVENOUS

## 2016-09-20 MED ORDER — SODIUM CHLORIDE 0.9 % IV BOLUS (SEPSIS)
1000.0000 mL | Freq: Once | INTRAVENOUS | Status: AC
  Administered 2016-09-20: 1000 mL via INTRAVENOUS

## 2016-09-20 MED ORDER — LEVETIRACETAM 500 MG PO TABS
500.0000 mg | ORAL_TABLET | Freq: Every day | ORAL | Status: DC
  Administered 2016-09-21 – 2016-09-23 (×3): 500 mg via ORAL
  Filled 2016-09-20 (×4): qty 1

## 2016-09-20 MED ORDER — LEVETIRACETAM 500 MG PO TABS: 500.0000 mg | ORAL_TABLET | ORAL | Status: DC

## 2016-09-20 MED ORDER — LEVETIRACETAM 500 MG PO TABS
1000.0000 mg | ORAL_TABLET | Freq: Every day | ORAL | Status: DC
  Administered 2016-09-20 – 2016-09-22 (×3): 1000 mg via ORAL
  Filled 2016-09-20 (×3): qty 2

## 2016-09-20 MED ORDER — NALOXONE HCL 0.4 MG/ML IJ SOLN
0.4000 mg | Freq: Once | INTRAMUSCULAR | Status: AC
  Administered 2016-09-20: 0.4 mg via INTRAVENOUS
  Filled 2016-09-20: qty 1

## 2016-09-20 MED ORDER — ENOXAPARIN SODIUM 40 MG/0.4ML ~~LOC~~ SOLN
40.0000 mg | SUBCUTANEOUS | Status: DC
  Administered 2016-09-20 – 2016-09-22 (×3): 40 mg via SUBCUTANEOUS
  Filled 2016-09-20 (×4): qty 0.4

## 2016-09-20 MED ORDER — VITAMIN B-1 100 MG PO TABS
100.0000 mg | ORAL_TABLET | Freq: Every day | ORAL | Status: DC
  Administered 2016-09-21 – 2016-09-23 (×3): 100 mg via ORAL
  Filled 2016-09-20 (×3): qty 1

## 2016-09-20 MED ORDER — LORAZEPAM 2 MG/ML IJ SOLN
1.0000 mg | Freq: Four times a day (QID) | INTRAMUSCULAR | Status: DC | PRN
  Administered 2016-09-21: 1 mg via INTRAVENOUS
  Filled 2016-09-20: qty 1

## 2016-09-20 MED ORDER — SODIUM CHLORIDE 0.9 % IV BOLUS (SEPSIS)
1000.0000 mL | Freq: Once | INTRAVENOUS | Status: AC
Start: 2016-09-20 — End: 2016-09-20
  Administered 2016-09-20: 1000 mL via INTRAVENOUS

## 2016-09-20 NOTE — ED Notes (Signed)
Notified pt that urine sample was needed. Pt states she is unable to void at this time.

## 2016-09-20 NOTE — ED Notes (Signed)
Pt has been changed into purple scrubs and all belongings have been removed.
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
## 2016-09-20 NOTE — H&P (Signed)
History and Physical   Suzanne KirksDeborah Zuercher ZOX:[id number] DOB: 01/20/1971 DOA: 09/20/2016  Referring MD/NP/PA: Ollen Bargesampubri-Soms, PA, EDP PCP: No primary care provider on file.  Patient coming from: Home  Chief Complaint: alcohol withdrawal and drug overdose  HPI: Suzanne Kline is a 46 y.o. female with medical history significant of seizure disorder, schizophrenia, anxiety, depression, PTSD, borderline personality disorder, and asthma/COPD who presented to the ED requesting alcohol detox. She was also endorsing suicidal and homicidal ideation at time of evaluation in ED. During work up, she was noted to become somnolent, poorly responsive, and tachycardic. EDP noticed seroquel by the patient, filled 09/07/16 (13 days ago) for 30 tabs, has 7.5 tabs left, with suspicion for intentional overdose in the ED. Poison control was contacted, advising side effects include sinus tachycardia, hypotension (mild), EKG changes (QT prolongation), seizures, urinary retention, and CNS suppression. All symptoms the patient was exhibiting. [facility] was called to admit. At the time of assessment, the patient is responsive but somnolent, not cooperative with history and therefore a level V caveat applies. History is gained from the chart and the ED provider.   Per EDP: Patient presents to the ED with complaints of requesting detox for polysubstance abuse as well as passive suicidal thoughts without a plan and homicidal thoughts towards her abuser with a plan of buying a gun and paying someone to shoot him. She denies AVH. She has had prior suicide attempts, but states they're always "spur of the moment things". Patient admits to consuming 1.5 L of vodka daily, last sip 6 hours prior to arrival, and smoking cocaine and marijuana daily, last use 6 hours ago. She is requesting detox from these substances, and states that she's been having withdrawal symptoms of nausea, 2 episodes of nonbloody nonbilious emesis, and 3 episodes of watery  nonbloody diarrhea, tremors/shakes, and chills. She states that she has been off of all of her medications for 2 weeks with the exception of Seroquel which she took 3 days ago. Her medications include albuterol, flovent, Minipress, Klonopin, Zoloft, Keppra, and seroquel. She states that she had 2 seizures yesterday but none today.  Review of Systems: Otherwise, per HPI. All others reviewed and are negative.   Past Medical History:  Diagnosis Date  . Anxiety   . Asthma   . Depression   . Gastritis   . Schizo-affective schizophrenia ([name])   . Seizures ([name])     Past Surgical History:  Procedure Laterality Date  . ABDOMINAL HYSTERECTOMY    . TUMOR REMOVAL      - Unable to obtain from patient. EMR states:  reports that she has been smoking.  She has been smoking about 0.00 packs per day. She does not have any smokeless tobacco history on file. She reports that she drinks alcohol. She reports that she does not use drugs.  Allergies  Allergen Reactions  . Bee Venom Anaphylaxis  . Methylprednisolone Other (See Comments)    fainting , somnolence   . Tegretol [Carbamazepine] Hives and Rash    History reviewed. No pertinent family history. - Family history not able to be reviewed due to pt somnolence. Prior to Admission medications   Medication Sig Start Date End Date Taking? Authorizing Provider  clonazePAM (KLONOPIN) 1 MG tablet Take 1 mg by mouth 2 (two) times daily.   Yes Historical Provider, MD  famotidine (PEPCID) 20 MG tablet Take 1 tablet (20 mg total) by mouth 2 (two) times daily. For acid reflux Patient taking differently: Take 20 mg by mouth 2 (  two) times daily as needed for heartburn. For acid reflux 09/02/15  Yes Sanjuana Kava, NP  fluticasone (FLOVENT HFA) 110 MCG/ACT inhaler Inhale 2 puffs into the lungs 2 (two) times daily as needed (sob/wheezing). 09/02/15  Yes Sanjuana Kava, NP  gabapentin (NEURONTIN) 400 MG capsule Take 1 capsule (400 mg total) by mouth 3 (three) times  daily. 09/08/15  Yes Jimmy Footman, MD  hydrOXYzine (ATARAX/VISTARIL) 25 MG tablet Take 25 mg by mouth 4 (four) times daily as needed for anxiety.   Yes Historical Provider, MD  levETIRAcetam (KEPPRA) 500 MG tablet Take 1 tablet (500 mg total) by mouth daily with breakfast. Take 1 tablet (500 mg) in am & 2 tablets (1000 mg) at bedtime: For mood stabilization/seizure activities Patient taking differently: Take 500-1,000 mg by mouth See admin instructions. 500 mg every morning and 1000 mg at bedtime 09/02/15  Yes Sanjuana Kava, NP  lidocaine (LIDODERM) 5 % Place 1 patch onto the skin daily. Remove & Discard patch within 12 hours or as directed by MD: For pain management 09/02/15  Yes Sanjuana Kava, NP  loratadine (CLARITIN) 10 MG tablet Take 1 tablet (10 mg total) by mouth daily. (May buy from over the counter): For allergies 09/02/15  Yes Sanjuana Kava, NP  meloxicam (MOBIC) 15 MG tablet Take 1 tablet (15 mg total) by mouth daily. 09/08/15  Yes Jimmy Footman, MD  prazosin (MINIPRESS) 1 MG capsule Take 1 capsule (1 mg total) by mouth at bedtime. For PTSD related nightmares 09/02/15  Yes Sanjuana Kava, NP  QUEtiapine (SEROQUEL XR) 400 MG 24 hr tablet Take 400 mg by mouth at bedtime.   Yes Historical Provider, MD  sertraline (ZOLOFT) 100 MG tablet Take 1 tablet (100 mg total) by mouth daily. For depression 09/02/15  Yes Sanjuana Kava, NP  elvitegravir-cobicistat-emtricitabine-tenofovir (GENVOYA) 150-150-200-10 MG TABS tablet Take 1 tablet by mouth daily. Patient not taking: Reported on 09/20/2016 09/04/15   Pricilla Loveless, MD  methocarbamol (ROBAXIN) 750 MG tablet Take 1 tablet (750 mg total) by mouth 3 (three) times daily. For muscle spasms Patient not taking: Reported on 09/20/2016 09/02/15   Sanjuana Kava, NP  QUEtiapine (SEROQUEL XR) 300 MG 24 hr tablet Take 1 tablet (300 mg total) by mouth at bedtime. Patient not taking: Reported on 09/20/2016 09/08/15   Jimmy Footman,  MD    Physical Exam: Vitals:   09/20/16 1445 09/20/16 1500 09/20/16 1515 09/20/16 1530  BP: 105/78 96/78 101/80 101/75  Pulse: 96 93 95 92  Resp:    14  Temp:      TempSrc:      SpO2: 95% 98% 94% 97%   Constitutional: Soundly resting 46 y.o. female in no distress Eyes: Lids and conjunctivae normal, PERRL ENMT: Mucous membranes are moist. Posterior pharynx clear of any exudate or lesions. Fair dentition.  Neck: normal, supple, no masses, no thyromegaly Respiratory: Non-labored breathing without accessory muscle use. Clear breath sounds to auscultation bilaterally Cardiovascular: Regular rate and rhythm, no murmurs, rubs, or gallops. No carotid bruits. No JVD. No LE edema. 2+ pedal pulses. Abdomen: Normoactive bowel sounds. Soft, obese, with striae. No tenderness, non-distended, and no masses palpated. No hepatosplenomegaly. GU: No indwelling catheter Musculoskeletal: No clubbing / cyanosis. No joint deformity upper and lower extremities. No contractures. Normal muscle tone.  Skin: Warm, dry. No rashes, wounds, no ulcers. No significant lesions noted.  Neurologic: Pupils reactive bilaterally, no focal deficits noted, moving all 4 extremities. Gait not assessed. Mildly slurring  speech without facial droop.  Psychiatric: Drowsy, oriented to person and place, would not answer further questions. Unable to determine, but did endorse homicidal and suicidal ideation in the ED.  Labs on Admission: I have personally reviewed following labs and imaging studies  CBC:  Recent Labs Lab 09/20/16 0725  WBC 13.9*  NEUTROABS 7.6  HGB 15.8*  HCT 45.6  MCV 100.0  PLT 378   Basic Metabolic Panel:  Recent Labs Lab 09/20/16 0725 09/20/16 1404  NA 142 140  K 4.2 4.7  CL 108 110  CO2 23 21*  GLUCOSE 77 81  BUN 9 11  CREATININE 0.63 0.69  CALCIUM 9.2 8.4*   GFR: CrCl cannot be calculated (Unknown ideal weight.). Liver Function Tests:  Recent Labs Lab 09/20/16 0725 09/20/16 1404    AST 18 21  ALT 17 14  ALKPHOS 106 82  BILITOT 0.3 1.5*  PROT 7.7 6.5  ALBUMIN 4.3 3.3*    Recent Labs Lab 09/20/16 0725  LIPASE 24  Urine analysis:    Component Value Date/Time   COLORURINE YELLOW (A) 09/05/2015 0949   APPEARANCEUR CLEAR (A) 09/05/2015 0949   LABSPEC 1.010 09/05/2015 0949   PHURINE 5.0 09/05/2015 0949   GLUCOSEU NEGATIVE 09/05/2015 0949   HGBUR NEGATIVE 09/05/2015 0949   BILIRUBINUR NEGATIVE 09/05/2015 0949   KETONESUR NEGATIVE 09/05/2015 0949   PROTEINUR NEGATIVE 09/05/2015 0949   NITRITE NEGATIVE 09/05/2015 0949   LEUKOCYTESUR NEGATIVE 09/05/2015 0949   EKG: Independently reviewed. NSR, vent rate 142. QTc . No ST depression or elevations.  Assessment/Plan Principal Problem:   Intentional drug overdose ([name]) Active Problems:   Posttraumatic stress disorder   Tobacco use disorder   Stimulant use disorder ([name]) cocaine use disorder   Major depressive disorder, recurrent episode, severe ([name])   Suicide attempt by intentional drug overdose, likely seroquel: In setting of significant psychiatric history and remote and recent polysubstance abuse. EtOH level weakly positive in ED, UDS not collected due to dehydration and urinary retention.  - Check UDS - Follow up LFTs in AM - Monitor UOP; pt was dehydrated, received 2L NS bolus and has yet to urinate since arrival. I have discussed with RN staff. Will need to bladder scan/I and O if not urinating.  - Ok to restrain pt for safety of pt and staff.  - Sitter at bedside/suicide precautions - Psychiatry consulted for evaluation in AM - Monitor QTc on telemetry  Seizure disorder: And medication nonadherence, reports recent seizures. Risk increased by seroquel OD.  - Seizure precautions, needs close monitoring - Ativan prn seizure - Follow up keppra level  Alcohol/polysubstance abuse: - CIWA - Multivitamins  DVT prophylaxis: Lovenox  Code Status: Full  Family Communication: None at  bedside Disposition Plan: Anticipate to inpatient psychiatry in 24 hours.  Consults called: Psychiatry called for AM consult (1/3)  Admission status: Observation    Hazeline Junker, MD [facility] Pager [phone]  If 7PM-7AM, please contact night-coverage [email or web address] Password [email or web address] 09/20/2016, 3:59 PM

## 2016-09-20 NOTE — ED Triage Notes (Signed)
Pt brought by EMS  for  n/v/d and seeking help due to  using alcohol,cocaine and marijuana. Last use was at 0000. [facility]

## 2016-09-20 NOTE — ED Notes (Signed)
Noted patient to have a heart rate of 143. Patient only arouses with painful stimuli. EDPA notified. Noted that patient had a back pack in her bed, that was unzipped and a clear bottle of fluids also in the bed.

## 2016-09-20 NOTE — ED Notes (Addendum)
1247 EDPa has bottles of meds that were in back pack. Also writer opened the clear bottle of fluid and bottle smelled of alcohol. EDPA notified. EDPA notified writer that the patient was suicidal.

## 2016-09-20 NOTE — ED Notes (Signed)
Pt unhooked herself from the monitor and iv, pt stated that she needs something for her  Withdrawals. Pt also requested ice water.

## 2016-09-20 NOTE — ED Notes (Signed)
Pt is aware that a urine sample is needed but is still unable to provide one at this time

## 2016-09-20 NOTE — ED Notes (Signed)
Pt states she has not taken medications in 2 weeks. Pt also states she had 2 seizures yesterday and has not taken her Keppra. Pt has complaints of nausea and wants to seek help for substance abuse.

## 2016-09-20 NOTE — ED Notes (Signed)
Attempted to start IV site x 2, but unsuccessful.

## 2016-09-20 NOTE — Progress Notes (Signed)
Bladder scan performed on patient showing >558 ml in bladder. I/O cath done as ordered. 650 ml urine returned. Sample sent to lab as ordered.

## 2016-09-20 NOTE — ED Notes (Signed)
Bed: [id number] Expected date:  Expected time:  Means of arrival:  Comments: 46 yo F  N/V/D mental health issues, drug abuse

## 2016-09-20 NOTE — ED Notes (Signed)
Pt is aware that a urine sample is needed. But is unable to provide one at this time.

## 2016-09-20 NOTE — ED Notes (Addendum)
[facility] poured alcohol in the sink.

## 2016-09-20 NOTE — ED Notes (Signed)
RN aware of urine.

## 2016-09-20 NOTE — ED Notes (Signed)
Pt was wanded °

## 2016-09-21 ENCOUNTER — Encounter (HOSPITAL_COMMUNITY): Payer: Self-pay

## 2016-09-21 DIAGNOSIS — Z9114 Patient's other noncompliance with medication regimen: Secondary | ICD-10-CM | POA: Diagnosis not present

## 2016-09-21 DIAGNOSIS — F250 Schizoaffective disorder, bipolar type: Secondary | ICD-10-CM | POA: Diagnosis not present

## 2016-09-21 DIAGNOSIS — Z7951 Long term (current) use of inhaled steroids: Secondary | ICD-10-CM | POA: Diagnosis not present

## 2016-09-21 DIAGNOSIS — F419 Anxiety disorder, unspecified: Secondary | ICD-10-CM | POA: Diagnosis present

## 2016-09-21 DIAGNOSIS — T1491XA Suicide attempt, initial encounter: Secondary | ICD-10-CM

## 2016-09-21 DIAGNOSIS — Z888 Allergy status to other drugs, medicaments and biological substances status: Secondary | ICD-10-CM

## 2016-09-21 DIAGNOSIS — Z7289 Other problems related to lifestyle: Secondary | ICD-10-CM | POA: Diagnosis not present

## 2016-09-21 DIAGNOSIS — R4182 Altered mental status, unspecified: Secondary | ICD-10-CM | POA: Diagnosis present

## 2016-09-21 DIAGNOSIS — E860 Dehydration: Secondary | ICD-10-CM | POA: Diagnosis present

## 2016-09-21 DIAGNOSIS — J449 Chronic obstructive pulmonary disease, unspecified: Secondary | ICD-10-CM | POA: Diagnosis present

## 2016-09-21 DIAGNOSIS — F332 Major depressive disorder, recurrent severe without psychotic features: Secondary | ICD-10-CM | POA: Diagnosis present

## 2016-09-21 DIAGNOSIS — Z79899 Other long term (current) drug therapy: Secondary | ICD-10-CM

## 2016-09-21 DIAGNOSIS — R45850 Homicidal ideations: Secondary | ICD-10-CM | POA: Diagnosis present

## 2016-09-21 DIAGNOSIS — F4310 Post-traumatic stress disorder, unspecified: Secondary | ICD-10-CM | POA: Diagnosis present

## 2016-09-21 DIAGNOSIS — Z91030 Bee allergy status: Secondary | ICD-10-CM

## 2016-09-21 DIAGNOSIS — G894 Chronic pain syndrome: Secondary | ICD-10-CM | POA: Diagnosis present

## 2016-09-21 DIAGNOSIS — T43592A Poisoning by other antipsychotics and neuroleptics, intentional self-harm, initial encounter: Principal | ICD-10-CM

## 2016-09-21 DIAGNOSIS — F10239 Alcohol dependence with withdrawal, unspecified: Secondary | ICD-10-CM | POA: Diagnosis present

## 2016-09-21 DIAGNOSIS — F1310 Sedative, hypnotic or anxiolytic abuse, uncomplicated: Secondary | ICD-10-CM | POA: Diagnosis present

## 2016-09-21 DIAGNOSIS — R45851 Suicidal ideations: Secondary | ICD-10-CM

## 2016-09-21 DIAGNOSIS — T50902A Poisoning by unspecified drugs, medicaments and biological substances, intentional self-harm, initial encounter: Secondary | ICD-10-CM | POA: Diagnosis not present

## 2016-09-21 DIAGNOSIS — F1410 Cocaine abuse, uncomplicated: Secondary | ICD-10-CM | POA: Diagnosis present

## 2016-09-21 DIAGNOSIS — F17210 Nicotine dependence, cigarettes, uncomplicated: Secondary | ICD-10-CM

## 2016-09-21 DIAGNOSIS — F259 Schizoaffective disorder, unspecified: Secondary | ICD-10-CM | POA: Diagnosis present

## 2016-09-21 DIAGNOSIS — R6889 Other general symptoms and signs: Secondary | ICD-10-CM | POA: Diagnosis not present

## 2016-09-21 DIAGNOSIS — F333 Major depressive disorder, recurrent, severe with psychotic symptoms: Secondary | ICD-10-CM | POA: Diagnosis not present

## 2016-09-21 DIAGNOSIS — F17200 Nicotine dependence, unspecified, uncomplicated: Secondary | ICD-10-CM | POA: Diagnosis present

## 2016-09-21 DIAGNOSIS — G40909 Epilepsy, unspecified, not intractable, without status epilepticus: Secondary | ICD-10-CM | POA: Diagnosis present

## 2016-09-21 DIAGNOSIS — Z915 Personal history of self-harm: Secondary | ICD-10-CM | POA: Diagnosis not present

## 2016-09-21 DIAGNOSIS — R339 Retention of urine, unspecified: Secondary | ICD-10-CM | POA: Diagnosis present

## 2016-09-21 DIAGNOSIS — Z9119 Patient's noncompliance with other medical treatment and regimen: Secondary | ICD-10-CM | POA: Diagnosis not present

## 2016-09-21 DIAGNOSIS — G4700 Insomnia, unspecified: Secondary | ICD-10-CM | POA: Diagnosis present

## 2016-09-21 DIAGNOSIS — F603 Borderline personality disorder: Secondary | ICD-10-CM | POA: Diagnosis present

## 2016-09-21 LAB — MAGNESIUM: Magnesium: 1.8 mg/dL (ref 1.7–2.4)

## 2016-09-21 MED ORDER — LOPERAMIDE HCL 2 MG PO CAPS: 2.0000 mg | ORAL_CAPSULE | ORAL | Status: DC | PRN

## 2016-09-21 MED ORDER — NICOTINE 21 MG/24HR TD PT24
21.0000 mg | MEDICATED_PATCH | Freq: Every day | TRANSDERMAL | Status: DC
  Administered 2016-09-21 – 2016-09-23 (×3): 21 mg via TRANSDERMAL
  Filled 2016-09-21 (×4): qty 1

## 2016-09-21 MED ORDER — GABAPENTIN 400 MG PO CAPS
400.0000 mg | ORAL_CAPSULE | Freq: Three times a day (TID) | ORAL | Status: DC
  Administered 2016-09-21 – 2016-09-23 (×7): 400 mg via ORAL
  Filled 2016-09-21 (×7): qty 1

## 2016-09-21 MED ORDER — QUETIAPINE FUMARATE 25 MG PO TABS
50.0000 mg | ORAL_TABLET | Freq: Every day | ORAL | Status: DC
Start: 2016-09-22 — End: 2016-09-23
  Administered 2016-09-22 – 2016-09-23 (×2): 50 mg via ORAL
  Filled 2016-09-21 (×2): qty 2

## 2016-09-21 MED ORDER — QUETIAPINE FUMARATE 100 MG PO TABS
100.0000 mg | ORAL_TABLET | Freq: Every day | ORAL | Status: DC
  Administered 2016-09-21 – 2016-09-22 (×2): 100 mg via ORAL
  Filled 2016-09-21 (×2): qty 1

## 2016-09-21 MED ORDER — SERTRALINE HCL 100 MG PO TABS
100.0000 mg | ORAL_TABLET | Freq: Every day | ORAL | Status: DC
  Administered 2016-09-22 – 2016-09-23 (×2): 100 mg via ORAL
  Filled 2016-09-21 (×2): qty 1
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
## 2016-09-21 NOTE — Progress Notes (Signed)
[facility] Name is on list to be seen by psychiatrist today. Informed RNCM.   Vivi BarrackNicole Jadriel Saxer, Theresia MajorsLCSWA, MSW Clinical Social Worker 5E and Psychiatric Service Line [phone]

## 2016-09-21 NOTE — Care Management Obs Status (Signed)
MEDICARE OBSERVATION STATUS NOTIFICATION   Patient Details  Name: Suzanne Kline MRN: [id number] Date of Birth: 04/29/1971   Medicare Observation Status Notification Given:  Yes    MahabirOlegario Messier, Camelle Henkels, RN 09/21/2016, 12:54 PM

## 2016-09-22 ENCOUNTER — Encounter (HOSPITAL_COMMUNITY): Payer: Self-pay

## 2016-09-22 DIAGNOSIS — F603 Borderline personality disorder: Secondary | ICD-10-CM

## 2016-09-22 LAB — LEVETIRACETAM LEVEL: Levetiracetam Lvl: NOT DETECTED ug/mL (ref 10.0–40.0)

## 2016-09-22 MED ORDER — PRAZOSIN HCL 1 MG PO CAPS
1.0000 mg | ORAL_CAPSULE | Freq: Every day | ORAL | Status: DC
  Administered 2016-09-22: 1 mg via ORAL
  Filled 2016-09-22: qty 1

## 2016-09-22 MED ORDER — HALOPERIDOL LACTATE 5 MG/ML IJ SOLN
2.0000 mg | Freq: Once | INTRAMUSCULAR | Status: AC
  Administered 2016-09-22: 2 mg via INTRAVENOUS
  Filled 2016-09-22: qty 1

## 2016-09-22 MED ORDER — LORAZEPAM 0.5 MG PO TABS
0.5000 mg | ORAL_TABLET | Freq: Once | ORAL | Status: AC
Start: 2016-09-22 — End: 2016-09-22
  Administered 2016-09-22: 0.5 mg via ORAL
  Filled 2016-09-22: qty 1

## 2016-09-22 NOTE — Care Management Note (Signed)
Case Management Note  Patient Details  Name: Barth KirksDeborah Delancy MRN: [id number] Date of Birth: 05/07/1971  Subjective/Objective:  Medically stable for d/c. Awaiting IP Psych Facility-Psych CSW following.                  Action/Plan:d/c IP Psych.   Expected Discharge Date:   (unknown)               Expected Discharge Plan:  Psychiatric Hospital  In-House Referral:  Clinical Social Work  Discharge planning Services  CM Consult  Post Acute Care Choice:    Choice offered to:     DME Arranged:    DME Agency:     HH Arranged:    HH Agency:     Status of Service:  Completed, signed off  If discussed at [employer] Meetings, dates discussed:    Additional Comments:  Lanier ClamMahabir, Shonice Wrisley, RN 09/22/2016, 11:31 AM

## 2016-09-22 NOTE — Progress Notes (Signed)
LCSWA met with patient at bedside. Patient has requested [place] fax a letter to [facility] courts indicating she is admitted to hospital. She reports their will be a warrant out for her arrest if she does not inform them. Patient chart/ reports she lost fax number. LCSWA attempted to call to retreive fax information, waited 30 min. For representative. LCSWA informed patient.  Patient is attempting to retreive fax number at this time.    Kathrin Greathouse, Latanya Presser, MSW Clinical Social Worker 5E and Psychiatric Service Line [phone] 09/22/2016  12:48 PM
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
## 2016-09-22 NOTE — Progress Notes (Signed)
RN called into room because pt had removed tele leads. Pt was wrapping leads around neck and telling someone to get off of her. I reassured her of where she was and replaced leads. Nurse Practitioner notified and Haldol was given. Will continue to monitor closely. Suzanne Kline S O'Bryant

## 2016-09-22 NOTE — Progress Notes (Signed)
[phone], per patient request. LCSWA assiting with patient placement to inpatient psych.  Vivi BarrackNicole Zeinab Rodwell, Theresia MajorsLCSWA, MSW Clinical Social Worker 5E and Psychiatric Service Line [phone] 09/22/2016  5:06 PM
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
## 2016-09-23 DIAGNOSIS — F1590 Other stimulant use, unspecified, uncomplicated: Secondary | ICD-10-CM

## 2016-09-23 MED ORDER — QUETIAPINE FUMARATE 100 MG PO TABS: 100.0000 mg | ORAL_TABLET | Freq: Every day | ORAL | 0 refills | Status: AC

## 2016-09-23 MED ORDER — QUETIAPINE FUMARATE 50 MG PO TABS: 50.0000 mg | ORAL_TABLET | Freq: Every day | ORAL | 0 refills | Status: AC

## 2016-09-23 NOTE — Progress Notes (Signed)
Discharge report called to Fermin SchwabElva Bovell at [facility]. Awaiting [place] for transport. Patient remains very anxious and agitated, otherwise medically stable. Melton Alarana A Bailei Buist, RN

## 2016-09-23 NOTE — Progress Notes (Signed)
Chaplain providing support in response to referral by SW.     Pt in need of shirt to discharge to [facility].  Chaplain provided t shirt and sweater.  Brief support with pt around discharge plans.  Suzanne Kline is currently in good spirits and hopeful to find housing post treatment at [facility].  Requested prayers for recovery and support.  Chaplain prayed with pt.    Suzanne Kline, Suzanne Kline Wayne MDiv
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
## 2016-09-23 NOTE — Progress Notes (Signed)
[facility] called for transport:  1 hour before pick up.  [facility]   Vivi BarrackNicole Aine Strycharz, Theresia MajorsLCSWA, MSW Clinical Social Worker 5E and Psychiatric Service Line [phone] 09/23/2016  11:47 AM

## 2016-09-23 NOTE — Progress Notes (Signed)
[facility] faxed clinical information to for [facility]: [facility] [address], [facility], [facility], [facility], [facility]. Will inform medical staff when bed is available.      Suzanne BarrackNicole Bodie Kline, Suzanne MajorsLCSWA, MSW Clinical Social Worker 5E and Psychiatric Service Line [phone] 09/23/2016  8:34 AM

## 2016-09-23 NOTE — Progress Notes (Signed)
[facility].  Attending: DR. Wendall StadeKohl Nurse call report: [phone] Patient voluntary. Facility [facility]   Vivi BarrackNicole Autry Droege, Theresia MajorsLCSWA, MSW Clinical Social Worker 5E and Psychiatric Service Line [phone] 09/23/2016  9:50 AM

## 2016-09-23 NOTE — Progress Notes (Signed)
Patient still very agitated and verbally aggressive, cursing staff. Insisting to go through belongings. Security called and patient went through several bags. She was satisfied that all her belongings were there. Medications were given to [facility] driver to transport. Melton Alarana A Kendric Sindelar, RN

## 2019-06-15 LAB — COVID-19 CARE EVERYWHERE: COVID-19 CARE EVERYWHERE: NOT DETECTED

## 2019-06-15 LAB — PROTHROMBIN TIME CARE EVERYWHERE
INR CARE EVERYWHERE: 1 (ref 0.9–1.1)
PROTHROMBIN TIME CARE EVERYWHERE: 13.3 s (ref 11.5–14.5)

## 2019-06-17 LAB — COVID-19 CARE EVERYWHERE: COVID-19 CARE EVERYWHERE: NEGATIVE

## 2019-06-22 LAB — HEMOGLOBIN A1C CARE EVERYWHERE
HEMOGLOBIN A1C CARE EVERYWHERE: 5.2 % (ref 4.2–5.6)
MEAN BLOOD GLUCOSE CARE EVERYWHERE: 103 mg/dL

## 2019-08-23 LAB — RESPIRATORY PANEL BASIC CARE EVERYWHERE
COVID-19 CARE EVERYWHERE: NEGATIVE
INFLUENZA A PCR CARE EVERYWHERE: NEGATIVE
INFLUENZA B PCR CARE EVERYWHERE: NEGATIVE
RSV PCR CARE EVERYWHERE: NEGATIVE

## 2019-08-23 LAB — COVID-19 CARE EVERYWHERE

## 2019-09-03 LAB — RESPIRATORY PANEL EXTENDED CARE EVERYWHERE

## 2019-09-04 ENCOUNTER — Inpatient Hospital Stay
Admit: 2019-09-04 | Disposition: A | Discharge status: Nursing Home (Includes ICF) | Source: Emergency Department | Attending: Psychiatry | Admitting: Psychiatry

## 2019-09-04 ENCOUNTER — Emergency Department
Admit: 2019-09-04 | Disposition: A | Discharge status: Other Acute Inpatient Hospital | Source: Ambulatory Visit | Attending: Ophthalmology | Admitting: Ophthalmology

## 2019-09-04 ENCOUNTER — Ambulatory Visit
Admission: EM | Admit: 2019-09-04 | Discharge: 2019-09-10 | Disposition: A | Discharge status: Home / Self Care | Payer: 59 | Source: Intra-hospital

## 2019-09-04 DIAGNOSIS — I2781 Cor pulmonale (chronic): Secondary | ICD-10-CM

## 2019-09-04 DIAGNOSIS — Z818 Family history of other mental and behavioral disorders: Secondary | ICD-10-CM

## 2019-09-04 DIAGNOSIS — F10239 Alcohol dependence with withdrawal, unspecified: Secondary | ICD-10-CM

## 2019-09-04 DIAGNOSIS — Z7951 Long term (current) use of inhaled steroids: Secondary | ICD-10-CM

## 2019-09-04 DIAGNOSIS — J449 Chronic obstructive pulmonary disease, unspecified: Secondary | ICD-10-CM

## 2019-09-04 DIAGNOSIS — F4310 Post-traumatic stress disorder, unspecified: Secondary | ICD-10-CM

## 2019-09-04 DIAGNOSIS — F329 Major depressive disorder, single episode, unspecified: Principal | ICD-10-CM

## 2019-09-04 DIAGNOSIS — G4733 Obstructive sleep apnea (adult) (pediatric): Secondary | ICD-10-CM

## 2019-09-04 DIAGNOSIS — Z590 Homelessness: Secondary | ICD-10-CM

## 2019-09-04 DIAGNOSIS — H5704 Mydriasis: Secondary | ICD-10-CM

## 2019-09-04 DIAGNOSIS — I509 Heart failure, unspecified: Secondary | ICD-10-CM

## 2019-09-04 DIAGNOSIS — F419 Anxiety disorder, unspecified: Secondary | ICD-10-CM

## 2019-09-04 DIAGNOSIS — I272 Pulmonary hypertension, unspecified: Secondary | ICD-10-CM

## 2019-09-04 DIAGNOSIS — F17210 Nicotine dependence, cigarettes, uncomplicated: Secondary | ICD-10-CM

## 2019-09-04 DIAGNOSIS — F603 Borderline personality disorder: Secondary | ICD-10-CM

## 2019-09-04 DIAGNOSIS — F1410 Cocaine abuse, uncomplicated: Secondary | ICD-10-CM

## 2019-09-04 DIAGNOSIS — I501 Left ventricular failure: Secondary | ICD-10-CM

## 2019-09-04 DIAGNOSIS — R0789 Other chest pain: Secondary | ICD-10-CM

## 2019-09-04 DIAGNOSIS — K219 Gastro-esophageal reflux disease without esophagitis: Secondary | ICD-10-CM

## 2019-09-04 DIAGNOSIS — I110 Hypertensive heart disease with heart failure: Secondary | ICD-10-CM

## 2019-09-04 DIAGNOSIS — M7120 Synovial cyst of popliteal space [Baker], unspecified knee: Secondary | ICD-10-CM

## 2019-09-04 LAB — HX TOXICOLOGY-DRUG,URINE
HX AMPHETAMINE: NOT DETECTED
HX BARBITUATES: NOT DETECTED
HX BENZODIAZEPINE: DETECTED — AB
HX BUPRENORPHINE, URINE: NOT DETECTED
HX CANNABINOIDS: NOT DETECTED
HX COCAINE: DETECTED — AB
HX FENTANYL, URINE: DETECTED — AB
HX METHADONE: NOT DETECTED
HX OPIATES: NOT DETECTED
HX OXYCODONE: NOT DETECTED
HX U ETHANOL: DETECTED — AB

## 2019-09-04 LAB — HX CHEM-PANELS
HX ANION GAP: 16 — ABNORMAL HIGH (ref 3–14)
HX BLOOD UREA NITROGEN: 13 mg/dL (ref 6–24)
HX CHLORIDE (CL): 108 meq/L (ref 98–110)
HX CO2: 20 meq/L (ref 20–30)
HX CREATININE (CR): 0.79 mg/dL (ref 0.57–1.30)
HX GFR, AFRICAN AMERICAN: 102 mL/min/{1.73_m2}
HX GFR, NON-AFRICAN AMERICAN: 88 mL/min/{1.73_m2}
HX GLUCOSE: 101 mg/dL (ref 70–139)
HX POTASSIUM (K): 3.7 meq/L (ref 3.6–5.1)
HX SODIUM (NA): 144 meq/L (ref 135–145)

## 2019-09-04 LAB — HX TOXICOLOGY-DRUG, SERUM
HX ACETAMINOPHEN: <3 ug/mL — ABNORMAL LOW (ref 10–20)
HX BARBITUATES QL, SERUM: NOT DETECTED
HX BENZODIAZEPINES QL, SERUM: NOT DETECTED
HX ETHANOL: 64 mg/dL — ABNORMAL HIGH
HX SALICYLATE: <4 mg/dL — ABNORMAL LOW (ref 15.0–29.9)
HX TCA: NOT DETECTED

## 2019-09-04 LAB — HX MICRO-RESP VIRAL PANEL: HX COVID-19 (SARS-COV-2) RAPID: NEGATIVE

## 2019-09-04 LAB — HX HEM-ROUTINE
HX HCT: 44.3 % (ref 32.0–45.0)
HX HGB: 14.2 g/dL (ref 11.0–15.0)
HX MCH: 30.5 pg (ref 26.0–34.0)
HX MCHC: 32.1 g/dL (ref 32.0–36.0)
HX MCV: 95.3 fL (ref 80.0–98.0)
HX MPV: 11 fL (ref 9.1–11.7)
HX NRBC #: 0 10*3/uL
HX NUCLEATED RBC: 0 %
HX PLT: 490 10*3/uL — ABNORMAL HIGH (ref 150–400)
HX RBC BLOOD COUNT: 4.65 M/uL (ref 3.70–5.00)
HX RDW: 15 % — ABNORMAL HIGH (ref 11.5–14.5)
HX WBC: 13.7 10*3/uL — ABNORMAL HIGH (ref 4.0–11.0)

## 2019-09-04 LAB — HX IMMUNOLOGY: HX BETA HCG QUANT: <5 m[IU]/mL (ref 0.0–5.0)

## 2019-09-04 LAB — HX DIABETES: HX GLUCOSE: 101 mg/dL (ref 70–139)

## 2019-09-04 LAB — COVID-19 CARE EVERYWHERE: COVID-19 CARE EVERYWHERE: NEGATIVE
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
## 2019-09-05 LAB — HX CHOLESTEROL
HX CHOLESTEROL: 234 mg/dL — ABNORMAL HIGH (ref 110–199)
HX HIGH DENSITY LIPOPROTEIN CHOL (HDL): 38 mg/dL (ref 35–75)
HX LDL CHOLESTEROL, DIRECT: 162 mg/dL — ABNORMAL HIGH (ref 0–129)
HX TRIGLYCERIDES: 253 mg/dL — ABNORMAL HIGH (ref 40–250)

## 2019-09-05 LAB — HX HEM-ROUTINE
HX BASO #: 0 10*3/uL (ref 0.0–0.2)
HX BASO: 0 %
HX EOSIN #: 0.2 10*3/uL (ref 0.0–0.5)
HX EOSIN: 2 %
HX HCT: 40.5 % (ref 32.0–45.0)
HX HGB: 12.9 g/dL (ref 11.0–15.0)
HX IMMATURE GRANULOCYTE#: 0 10*3/uL (ref 0.0–0.1)
HX IMMATURE GRANULOCYTE: 0 %
HX LYMPH #: 6.2 10*3/uL — ABNORMAL HIGH (ref 1.0–4.0)
HX LYMPH: 60 %
HX MCH: 30.8 pg (ref 26.0–34.0)
HX MCHC: 31.9 g/dL — ABNORMAL LOW (ref 32.0–36.0)
HX MCV: 96.7 fL (ref 80.0–98.0)
HX MONO #: 0.7 10*3/uL (ref 0.2–0.8)
HX MONO: 7 %
HX MPV: 11 fL (ref 9.1–11.7)
HX NEUT #: 3.3 10*3/uL (ref 1.5–7.5)
HX NRBC #: 0 10*3/uL
HX NUCLEATED RBC: 0 %
HX PLT: 398 10*3/uL (ref 150–400)
HX RBC BLOOD COUNT: 4.19 M/uL (ref 3.70–5.00)
HX RDW: 14.9 % — ABNORMAL HIGH (ref 11.5–14.5)
HX SEG NEUT: 31 %
HX WBC: 10.5 10*3/uL (ref 4.0–11.0)

## 2019-09-05 LAB — HX CHEM-OTHER
HX CALCIUM (CA): 9.6 mg/dL (ref 8.5–10.5)
HX MAGNESIUM: 1.9 mg/dL (ref 1.6–2.6)
HX PHOSPHORUS: 3.5 mg/dL (ref 2.7–4.5)

## 2019-09-05 LAB — HX CHEM-PANELS
HX ANION GAP: 9 (ref 3–14)
HX BLOOD UREA NITROGEN: 14 mg/dL (ref 6–24)
HX CHLORIDE (CL): 106 meq/L (ref 98–110)
HX CO2: 24 meq/L (ref 20–30)
HX CREATININE (CR): 0.71 mg/dL (ref 0.57–1.30)
HX GFR, AFRICAN AMERICAN: 116 mL/min/{1.73_m2}
HX GFR, NON-AFRICAN AMERICAN: 100 mL/min/{1.73_m2}
HX GLUCOSE: 113 mg/dL (ref 70–139)
HX POTASSIUM (K): 3.9 meq/L (ref 3.6–5.1)
HX SODIUM (NA): 139 meq/L (ref 135–145)

## 2019-09-05 LAB — HX CHEM-LFT
HX ALANINE AMINOTRANSFERASE (ALT/SGPT): 29 IU/L (ref 0–54)
HX ALKALINE PHOSPHATASE (ALK): 109 IU/L (ref 40–130)
HX ASPARTATE AMINOTRANFERASE (AST/SGOT): 24 IU/L (ref 10–42)
HX BILIRUBIN, DIRECT: 0.3 mg/dL (ref 0.0–0.5)
HX BILIRUBIN, TOTAL: 0.7 mg/dL (ref 0.2–1.1)
HX LACTATE DEHYDROGENASE (LDH): 239 IU/L — ABNORMAL HIGH (ref 120–220)

## 2019-09-05 LAB — HX CHEM-VITAMIN: HX VITAMIN B12: 254 pg/mL (ref 211–911)

## 2019-09-05 LAB — HX CHEM-LIPIDS
HX CHOLESTEROL: 234 mg/dL — ABNORMAL HIGH (ref 110–199)
HX HIGH DENSITY LIPOPROTEIN CHOL (HDL): 38 mg/dL (ref 35–75)
HX LDL CHOLESTEROL, DIRECT: 162 mg/dL — ABNORMAL HIGH (ref 0–129)
HX TRIGLYCERIDES: 253 mg/dL — ABNORMAL HIGH (ref 40–250)

## 2019-09-05 LAB — HX IMMUNOLOGY
HX BETA HCG QUANT: <5 m[IU]/mL (ref 0.0–5.0)
HX TREPONEMAL ANTIBODY: NONREACTIVE

## 2019-09-05 LAB — HX CHEM-METABOLIC
HX FOLATE, SERUM: 16.9 ng/mL (ref >=4.0)
HX HEMOGLOBIN A1C: 5.2 %
HX THYROID STIMULATING HORMONE (TSH): 1.07 u[IU]/mL (ref 0.35–4.94)

## 2019-09-05 LAB — HX DIABETES
HX GLUCOSE: 113 mg/dL (ref 70–139)
HX HEMOGLOBIN A1C: 5.2 %

## 2019-09-07 LAB — HX TOXICOLOGY-TDM: HX KEPPRA (LEVETIRACETEM): 2.2 ug/mL — ABNORMAL LOW

## 2019-09-10 LAB — HX DIABETES: HX GLUCOSE: 116 mg/dL (ref 70–139)

## 2019-09-10 LAB — HX CHEM-OTHER
HX CALCIUM (CA): 9.4 mg/dL (ref 8.5–10.5)
HX MAGNESIUM: 1.8 mg/dL (ref 1.6–2.6)
HX PHOSPHORUS: 3.1 mg/dL (ref 2.7–4.5)

## 2019-09-10 LAB — HX COAGULATION
HX D DIMER SENSITIVE: 167 ng/mL (ref 0–243)
HX FIBRINOGEN: 350 mg/dL (ref 170–440)
HX INR PT: 0.8 — ABNORMAL LOW (ref 0.9–1.3)
HX PROTHROMBIN TIME: 9.7 s (ref 9.7–14.0)
HX PTT: 28.9 s (ref 25.7–35.7)

## 2019-09-10 LAB — HX CHEM-PANELS
HX ANION GAP: 10 (ref 3–14)
HX BLOOD UREA NITROGEN: 14 mg/dL (ref 6–24)
HX CHLORIDE (CL): 107 meq/L (ref 98–110)
HX CO2: 26 meq/L (ref 20–30)
HX CREATININE (CR): 0.8 mg/dL (ref 0.57–1.30)
HX GFR, AFRICAN AMERICAN: 101 mL/min/{1.73_m2}
HX GFR, NON-AFRICAN AMERICAN: 87 mL/min/{1.73_m2}
HX GLUCOSE: 116 mg/dL (ref 70–139)
HX POTASSIUM (K): 4.1 meq/L (ref 3.6–5.1)
HX SODIUM (NA): 143 meq/L (ref 135–145)

## 2019-09-10 LAB — HX CHEM-ENZ-FRAC: HX B NATRIURETIC PEPTIDE (BNP): 136 pg/mL — ABNORMAL HIGH (ref 0–100)

## 2019-09-10 MED FILL — *GABAPENTIN 300 MG CAP: 16 days supply | Qty: 100 | Fill #0 | Status: AC

## 2019-09-10 MED FILL — BUPRENORPHINE HCL-NALOXONE: 3 days supply | Qty: 6 | Fill #0 | Status: AC

## 2019-09-10 MED FILL — *PANTOPRAZOLE SOD DR 40 MG: 17 days supply | Qty: 17 | Fill #0 | Status: AC

## 2019-09-10 MED FILL — METHOCARBAMOL 750 MG TABS: 16 days supply | Qty: 50 | Fill #0 | Status: AC

## 2019-09-10 MED FILL — levETIRAcetam 500 MG TABS: 16 days supply | Qty: 50 | Fill #0 | Status: AC

## 2019-09-10 MED FILL — QUETIAPINE FUMARATE 200 MG: 17 days supply | Qty: 17 | Fill #0 | Status: AC

## 2019-09-10 MED FILL — *cloNIDine HCL 0.1 MG TABS: 17 days supply | Qty: 34 | Fill #0 | Status: AC

## 2019-09-10 MED FILL — LATUDA 80 MG TAB: 17 days supply | Qty: 17 | Fill #0 | Status: AC

## 2019-09-11 LAB — RESPIRATORY PANEL EXTENDED CARE EVERYWHERE: COVID-19 CARE EVERYWHERE: NEGATIVE

## 2019-09-11 LAB — PROTHROMBIN TIME CARE EVERYWHERE
INR CARE EVERYWHERE: 0.9 (ref 0.9–1.1)
PROTHROMBIN TIME CARE EVERYWHERE: 11.9 s — ABNORMAL LOW (ref 12.0–14.9)

## 2019-09-16 LAB — RESPIRATORY PANEL EXTENDED CARE EVERYWHERE
COVID-19 CARE EVERYWHERE: NEGATIVE
INFLUENZA A PCR CARE EVERYWHERE: NEGATIVE
INFLUENZA B PCR CARE EVERYWHERE: NEGATIVE
RSV PCR CARE EVERYWHERE: NEGATIVE

## 2019-09-26 LAB — RESPIRATORY PANEL EXTENDED CARE EVERYWHERE

## 2019-09-26 LAB — COVID-19 CARE EVERYWHERE: COVID-19 CARE EVERYWHERE: NEGATIVE

## 2019-10-03 LAB — RESPIRATORY PANEL EXTENDED CARE EVERYWHERE

## 2019-10-04 LAB — COVID-19 CARE EVERYWHERE: COVID-19 CARE EVERYWHERE: NEGATIVE

## 2019-10-08 LAB — RESPIRATORY PANEL EXTENDED CARE EVERYWHERE

## 2019-10-09 LAB — COVID-19 CARE EVERYWHERE: COVID-19 CARE EVERYWHERE: NEGATIVE

## 2019-10-10 ENCOUNTER — Encounter (HOSPITAL_BASED_OUTPATIENT_CLINIC_OR_DEPARTMENT_OTHER): Payer: Self-pay

## 2019-10-23 DIAGNOSIS — F1120 Opioid dependence, uncomplicated: Secondary | ICD-10-CM | POA: Insufficient documentation

## 2019-10-24 DIAGNOSIS — I4891 Unspecified atrial fibrillation: Secondary | ICD-10-CM | POA: Insufficient documentation

## 2019-10-29 LAB — PROTHROMBIN TIME CARE EVERYWHERE
INR CARE EVERYWHERE: 1 (ref 0.9–1.1)
PROTHROMBIN TIME CARE EVERYWHERE: 13.1 s (ref 12.0–14.9)

## 2019-10-29 LAB — TYPE AND SCREEN CARE EVERYWHERE
ABO/RH CARE EVERYWHERE: O POS
ANTIBODY SCREEN CARE EVERYWHERE: NEGATIVE

## 2019-10-29 LAB — RESPIRATORY PANEL EXTENDED CARE EVERYWHERE: COVID-19 CARE EVERYWHERE: NEGATIVE

## 2019-10-30 LAB — HEMOGLOBIN A1C CARE EVERYWHERE
HEMOGLOBIN A1C CARE EVERYWHERE: 5.8 % — ABNORMAL HIGH (ref 4.2–5.6)
MEAN BLOOD GLUCOSE CARE EVERYWHERE: 120 mg/dL

## 2019-11-29 LAB — COVID-19 AND INFLUENZA CARE EVERYWHERE
COVID-19 CARE EVERYWHERE: NOT DETECTED
INFLUENZA A CARE EVERYWHERE: NOT DETECTED
INFLUENZA B CARE EVERYWHERE: NOT DETECTED

## 2019-12-03 LAB — COVID-19 AND INFLUENZA CARE EVERYWHERE
COVID-19 CARE EVERYWHERE: NOT DETECTED
INFLUENZA A CARE EVERYWHERE: NOT DETECTED
INFLUENZA B CARE EVERYWHERE: NOT DETECTED

## 2019-12-04 LAB — HEMOGLOBIN A1C CARE EVERYWHERE
ESTIMATED AVERAGE GLUCOSE CARE EVERYWHERE: 117 mg/dL
HEMOGLOBIN A1C CARE EVERYWHERE: 5.7 % — ABNORMAL HIGH (ref 4.6–5.6)

## 2019-12-06 DIAGNOSIS — I509 Heart failure, unspecified: Secondary | ICD-10-CM | POA: Insufficient documentation

## 2019-12-10 LAB — RESPIRATORY PANEL EXTENDED CARE EVERYWHERE: COVID-19 CARE EVERYWHERE: NEGATIVE

## 2020-01-06 LAB — COVID-19 AND INFLUENZA CARE EVERYWHERE
COVID-19 CARE EVERYWHERE: NOT DETECTED
INFLUENZA A CARE EVERYWHERE: NOT DETECTED
INFLUENZA B CARE EVERYWHERE: NOT DETECTED

## 2020-01-20 LAB — RESPIRATORY PANEL EXTENDED CARE EVERYWHERE
COVID-19 CARE EVERYWHERE: NEGATIVE
INFLUENZA A PCR CARE EVERYWHERE: NEGATIVE
INFLUENZA B PCR CARE EVERYWHERE: NEGATIVE
RSV PCR CARE EVERYWHERE: NEGATIVE

## 2020-01-21 LAB — PROTHROMBIN TIME CARE EVERYWHERE
INR CARE EVERYWHERE: 1.1 (ref 0.9–1.1)
PROTHROMBIN TIME CARE EVERYWHERE: 14.4 s (ref 12.0–14.9)

## 2020-01-21 LAB — RESPIRATORY PANEL EXTENDED CARE EVERYWHERE

## 2020-01-21 LAB — COVID-19 CARE EVERYWHERE: COVID-19 CARE EVERYWHERE: NEGATIVE

## 2020-03-01 LAB — COVID-19 AND INFLUENZA CARE EVERYWHERE
COVID-19 CARE EVERYWHERE: NOT DETECTED
INFLUENZA A CARE EVERYWHERE: NOT DETECTED
INFLUENZA B CARE EVERYWHERE: NOT DETECTED

## 2020-03-04 LAB — HEMOGLOBIN A1C CARE EVERYWHERE
ESTIMATED AVERAGE GLUCOSE CARE EVERYWHERE: 108 mg/dL
HEMOGLOBIN A1C CARE EVERYWHERE: 5.4 % (ref 4.6–5.6)

## 2020-03-09 LAB — COVID-19 AND INFLUENZA CARE EVERYWHERE
COVID-19 CARE EVERYWHERE: NOT DETECTED
INFLUENZA A CARE EVERYWHERE: NOT DETECTED
INFLUENZA B CARE EVERYWHERE: NOT DETECTED

## 2020-03-13 LAB — PROTHROMBIN TIME CARE EVERYWHERE
INR CARE EVERYWHERE: 1 (ref 0.9–1.1)
PROTHROMBIN TIME CARE EVERYWHERE: 12.9 s (ref 12.2–14.2)

## 2020-03-13 LAB — COVID-19 AND INFLUENZA CARE EVERYWHERE
COVID-19 CARE EVERYWHERE: NOT DETECTED
INFLUENZA A CARE EVERYWHERE: NOT DETECTED
INFLUENZA B CARE EVERYWHERE: NOT DETECTED

## 2020-03-13 LAB — APTT CARE EVERYWHERE: APTT CARE EVERYWHERE: 29 s (ref 22–36)

## 2020-03-16 LAB — RESPIRATORY PANEL EXTENDED CARE EVERYWHERE: COVID-19 CARE EVERYWHERE: NEGATIVE

## 2020-04-06 LAB — COVID-19 AND INFLUENZA CARE EVERYWHERE
COVID-19 CARE EVERYWHERE: NOT DETECTED
INFLUENZA A CARE EVERYWHERE: NOT DETECTED
INFLUENZA B CARE EVERYWHERE: NOT DETECTED

## 2020-04-11 LAB — COVID-19 CARE EVERYWHERE: COVID-19 CARE EVERYWHERE: NOT DETECTED

## 2020-04-30 LAB — COVID-19 AND INFLUENZA CARE EVERYWHERE
COVID-19 CARE EVERYWHERE: NOT DETECTED
INFLUENZA A CARE EVERYWHERE: NOT DETECTED
INFLUENZA B CARE EVERYWHERE: NOT DETECTED

## 2020-06-21 LAB — RESPIRATORY PANEL EXTENDED CARE EVERYWHERE: COVID-19 CARE EVERYWHERE: NEGATIVE

## 2020-06-23 LAB — COVID-19 CARE EVERYWHERE: COVID-19 CARE EVERYWHERE: NEGATIVE

## 2020-06-23 LAB — RESPIRATORY PANEL EXTENDED CARE EVERYWHERE

## 2020-06-24 LAB — HEMOGLOBIN A1C CARE EVERYWHERE
HEMOGLOBIN A1C CARE EVERYWHERE: 5.8 % — ABNORMAL HIGH (ref 4.2–5.6)
MEAN BLOOD GLUCOSE CARE EVERYWHERE: 120 mg/dL

## 2020-06-24 LAB — RESPIRATORY PANEL EXTENDED CARE EVERYWHERE: COVID-19 CARE EVERYWHERE: NEGATIVE

## 2020-06-25 LAB — RESPIRATORY PANEL EXTENDED CARE EVERYWHERE

## 2020-06-25 LAB — COVID-19 CARE EVERYWHERE: COVID-19 CARE EVERYWHERE: NEGATIVE

## 2020-07-01 LAB — RESPIRATORY PANEL EXTENDED CARE EVERYWHERE

## 2020-07-01 LAB — COVID-19 CARE EVERYWHERE: COVID-19 CARE EVERYWHERE: NEGATIVE

## 2020-07-04 LAB — APTT CARE EVERYWHERE: APTT CARE EVERYWHERE: 28.8 s (ref 24.0–36.9)

## 2020-07-04 LAB — COVID-19 CARE EVERYWHERE: COVID-19 CARE EVERYWHERE: NEGATIVE

## 2020-07-04 LAB — PROTHROMBIN TIME CARE EVERYWHERE
INR CARE EVERYWHERE: 0.8 — ABNORMAL LOW (ref 0.9–1.1)
PROTHROMBIN TIME CARE EVERYWHERE: 11.5 s — ABNORMAL LOW (ref 12.0–14.9)

## 2020-07-04 LAB — RESPIRATORY PANEL EXTENDED CARE EVERYWHERE

## 2020-07-05 LAB — PROTHROMBIN TIME CARE EVERYWHERE
INR CARE EVERYWHERE: 0.9 (ref 0.9–1.1)
PROTHROMBIN TIME CARE EVERYWHERE: 11.8 s — ABNORMAL LOW (ref 12.0–14.9)

## 2020-07-05 LAB — APTT CARE EVERYWHERE: APTT CARE EVERYWHERE: 29.4 s (ref 24.0–36.9)

## 2020-07-10 LAB — RESPIRATORY PANEL EXTENDED CARE EVERYWHERE

## 2020-07-11 LAB — COVID-19 CARE EVERYWHERE: COVID-19 CARE EVERYWHERE: NEGATIVE

## 2020-07-13 LAB — RESPIRATORY PANEL EXTENDED CARE EVERYWHERE

## 2020-07-14 LAB — COVID-19 CARE EVERYWHERE: COVID-19 CARE EVERYWHERE: NEGATIVE

## 2020-07-16 LAB — RESPIRATORY PANEL EXTENDED CARE EVERYWHERE

## 2020-07-17 LAB — COVID-19 CARE EVERYWHERE: COVID-19 CARE EVERYWHERE: NEGATIVE

## 2020-07-18 LAB — RESPIRATORY PANEL EXTENDED CARE EVERYWHERE

## 2020-07-19 LAB — COVID-19 CARE EVERYWHERE: COVID-19 CARE EVERYWHERE: NEGATIVE

## 2020-08-04 LAB — RESPIRATORY PANEL EXTENDED CARE EVERYWHERE: COVID-19 CARE EVERYWHERE: NEGATIVE

## 2020-08-09 LAB — COVID-19 CARE EVERYWHERE: COVID-19 CARE EVERYWHERE: NEGATIVE

## 2020-08-11 LAB — RESPIRATORY PANEL EXTENDED CARE EVERYWHERE

## 2020-08-12 LAB — COVID-19 CARE EVERYWHERE: COVID-19 CARE EVERYWHERE: NEGATIVE

## 2020-08-24 LAB — COVID-19 CARE EVERYWHERE: COVID-19 CARE EVERYWHERE: NEGATIVE

## 2020-08-24 LAB — RESPIRATORY PANEL EXTENDED CARE EVERYWHERE

## 2020-11-11 LAB — PROTHROMBIN TIME CARE EVERYWHERE
INR CARE EVERYWHERE: 1 (ref 0.9–1.1)
PROTHROMBIN TIME CARE EVERYWHERE: 12.7 s (ref 12.2–14.2)

## 2020-11-12 LAB — COVID-19 CARE EVERYWHERE: COVID-19 CARE EVERYWHERE: NOT DETECTED

## 2020-11-13 LAB — RESPIRATORY VIRUS PANEL CARE EVERYWHERE
ADENOVIRUS CARE EVERYWHERE: NOT DETECTED
HUMAN METAPNEUMOVIRUS CARE EVERYWHERE: NOT DETECTED
HUMAN RHINOVIRUS/ENTEROVIRUS CARE EVERYWHERE: NOT DETECTED
INFLUENZA A CARE EVERYWHERE: NOT DETECTED
INFLUENZA A H1 CARE EVERYWHERE: NOT DETECTED
INFLUENZA A H3 CARE EVERYWHERE: NOT DETECTED
INFLUENZA B CARE EVERYWHERE: NOT DETECTED
PARAINFLUENZA 1 CARE EVERYWHERE: NOT DETECTED
PARAINFLUENZA 2 CARE EVERYWHERE: NOT DETECTED
PARAINFLUENZA 3 CARE EVERYWHERE: NOT DETECTED
RSV A CARE EVERYWHERE: NOT DETECTED
RSV B CARE EVERYWHERE: NOT DETECTED

## 2020-11-30 LAB — RESPIRATORY PANEL EXTENDED CARE EVERYWHERE: COVID-19 CARE EVERYWHERE: NEGATIVE

## 2020-12-30 LAB — RESPIRATORY PANEL EXTENDED CARE EVERYWHERE
COVID-19 CARE EVERYWHERE: NEGATIVE
INFLUENZA A PCR CARE EVERYWHERE: NEGATIVE
INFLUENZA B PCR CARE EVERYWHERE: NEGATIVE
RSV PCR CARE EVERYWHERE: NEGATIVE

## 2020-12-31 LAB — RESPIRATORY PANEL EXTENDED CARE EVERYWHERE

## 2020-12-31 LAB — COVID-19 CARE EVERYWHERE: COVID-19 CARE EVERYWHERE: NEGATIVE

## 2021-01-05 LAB — RESPIRATORY PANEL BASIC CARE EVERYWHERE
COVID-19 CARE EVERYWHERE: NOT DETECTED
INFLUENZA A  CARE EVERYWHERE: NOT DETECTED
INFLUENZA B CARE EVERYWHERE: NOT DETECTED
RSV PCR CARE EVERYWHERE: NOT DETECTED

## 2021-01-05 LAB — COVID-19 AND INFLUENZA CARE EVERYWHERE
COVID-19 CARE EVERYWHERE: NOT DETECTED
INFLUENZA A CARE EVERYWHERE: NOT DETECTED
INFLUENZA B CARE EVERYWHERE: NOT DETECTED

## 2021-01-06 LAB — APTT CARE EVERYWHERE: APTT CARE EVERYWHERE: 35.9 s (ref 28.0–40.0)

## 2021-01-06 LAB — PROTHROMBIN TIME CARE EVERYWHERE
INR CARE EVERYWHERE: 1.1 (ref 0.9–1.1)
PROTHROMBIN TIME CARE EVERYWHERE: 13 s (ref 10.0–13.2)

## 2021-04-30 LAB — COVID-19 AND INFLUENZA CARE EVERYWHERE
COVID-19 CARE EVERYWHERE: NOT DETECTED
INFLUENZA A CARE EVERYWHERE: NOT DETECTED
INFLUENZA B CARE EVERYWHERE: NOT DETECTED

## 2021-05-13 LAB — COVID-19 AND INFLUENZA CARE EVERYWHERE
COVID-19 CARE EVERYWHERE: NOT DETECTED
INFLUENZA A CARE EVERYWHERE: NOT DETECTED
INFLUENZA B CARE EVERYWHERE: NOT DETECTED

## 2021-08-23 LAB — COVID-19 CARE EVERYWHERE: COVID-19 CARE EVERYWHERE: NEGATIVE

## 2021-08-23 LAB — RESPIRATORY PANEL EXTENDED CARE EVERYWHERE

## 2021-08-26 LAB — COVID-19 CARE EVERYWHERE: COVID-19 CARE EVERYWHERE: NEGATIVE

## 2021-08-26 LAB — RESPIRATORY PANEL EXTENDED CARE EVERYWHERE

## 2021-08-27 LAB — RESPIRATORY PANEL EXTENDED CARE EVERYWHERE
COVID-19 CARE EVERYWHERE: NEGATIVE
INFLUENZA A PCR CARE EVERYWHERE: NEGATIVE
INFLUENZA B PCR CARE EVERYWHERE: NEGATIVE
RSV PCR CARE EVERYWHERE: NEGATIVE

## 2021-08-27 LAB — HEMOGLOBIN A1C CARE EVERYWHERE
HEMOGLOBIN A1C CARE EVERYWHERE: 5.5 % (ref 4.2–5.6)
MEAN BLOOD GLUCOSE CARE EVERYWHERE: 111 mg/dL

## 2021-08-30 LAB — COVID-19 CARE EVERYWHERE: COVID-19 CARE EVERYWHERE: NEGATIVE

## 2021-08-30 LAB — RESPIRATORY PANEL EXTENDED CARE EVERYWHERE

## 2021-09-03 LAB — RESPIRATORY PANEL EXTENDED CARE EVERYWHERE

## 2021-09-03 LAB — COVID-19 CARE EVERYWHERE: COVID-19 CARE EVERYWHERE: NEGATIVE

## 2021-09-07 LAB — COVID-19 AND INFLUENZA CARE EVERYWHERE
COVID-19 CARE EVERYWHERE: NOT DETECTED
INFLUENZA A CARE EVERYWHERE: NOT DETECTED
INFLUENZA B CARE EVERYWHERE: NOT DETECTED

## 2021-09-23 LAB — COVID-19 CARE EVERYWHERE: COVID-19 CARE EVERYWHERE: NEGATIVE

## 2021-09-23 LAB — RESPIRATORY PANEL EXTENDED CARE EVERYWHERE

## 2021-09-23 LAB — PROTHROMBIN TIME CARE EVERYWHERE
INR CARE EVERYWHERE: 0.9 (ref 0.8–1.2)
PROTHROMBIN TIME CARE EVERYWHERE: 12.3 s (ref 11.3–14.5)

## 2021-09-24 LAB — COVID-19 CARE EVERYWHERE: COVID-19 CARE EVERYWHERE: NEGATIVE

## 2021-09-24 LAB — RESPIRATORY PANEL EXTENDED CARE EVERYWHERE

## 2021-09-27 LAB — RESPIRATORY PANEL EXTENDED CARE EVERYWHERE

## 2021-09-27 LAB — COVID-19 CARE EVERYWHERE: COVID-19 CARE EVERYWHERE: NEGATIVE

## 2021-09-29 LAB — COVID-19 CARE EVERYWHERE: COVID-19 CARE EVERYWHERE: NEGATIVE

## 2021-09-29 LAB — RESPIRATORY PANEL EXTENDED CARE EVERYWHERE

## 2021-10-04 LAB — COVID-19 CARE EVERYWHERE: COVID-19 CARE EVERYWHERE: NEGATIVE

## 2021-10-04 LAB — RESPIRATORY PANEL EXTENDED CARE EVERYWHERE

## 2021-10-16 LAB — APTT CARE EVERYWHERE: APTT CARE EVERYWHERE: 30 s (ref 22–36)

## 2021-10-16 LAB — PROTHROMBIN TIME CARE EVERYWHERE
INR CARE EVERYWHERE: 1 (ref 0.9–1.1)
PROTHROMBIN TIME CARE EVERYWHERE: 12.6 s (ref 12.2–14.2)

## 2021-10-17 LAB — COVID-19 CARE EVERYWHERE: COVID-19 CARE EVERYWHERE: NOT DETECTED

## 2021-11-03 LAB — COVID-19 AND INFLUENZA CARE EVERYWHERE
COVID-19 CARE EVERYWHERE: NOT DETECTED
INFLUENZA A CARE EVERYWHERE: NOT DETECTED
INFLUENZA B CARE EVERYWHERE: NOT DETECTED

## 2021-11-13 LAB — RESPIRATORY PANEL EXTENDED CARE EVERYWHERE
COVID-19 CARE EVERYWHERE: NEGATIVE
INFLUENZA A PCR CARE EVERYWHERE: NEGATIVE
INFLUENZA B PCR CARE EVERYWHERE: NEGATIVE
RSV PCR CARE EVERYWHERE: NEGATIVE

## 2021-11-20 LAB — RESPIRATORY PANEL EXTENDED CARE EVERYWHERE
ADENOVIRUS CARE EVERYWHERE: NOT DETECTED
BORDETELLA PARAPERTUSSIS CARE EVERYWHERE: NOT DETECTED
BORDETELLA PERTUSSIS CARE EVERYWHERE: NOT DETECTED
CHLAMYDIA PNEUMONIAE CARE EVERYWHERE: NOT DETECTED
CORONAVIRUS CARE EVERYWHERE: NOT DETECTED
COVID-19 CARE EVERYWHERE: NOT DETECTED
HUMAN METAPNEUMOVIRUS CARE EVERYWHERE: NOT DETECTED
INFLUENZA A  CARE EVERYWHERE: NOT DETECTED
INFLUENZA B  CARE EVERYWHERE: NOT DETECTED
MYCOPLASMA PNEUMONIAE CARE EVERYWHERE: NOT DETECTED
PARAINFLUENZA 1-4 RNA CARE EVERYWHERE: NOT DETECTED
RHINOVIRUS / ENTEROVIRUS CARE EVERYWHERE: NOT DETECTED
RSV  CARE EVERYWHERE: NOT DETECTED

## 2021-11-22 DIAGNOSIS — R45851 Suicidal ideations: Secondary | ICD-10-CM
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
## 2021-11-23 ENCOUNTER — Encounter (HOSPITAL_BASED_OUTPATIENT_CLINIC_OR_DEPARTMENT_OTHER): Payer: Self-pay

## 2021-11-23 ENCOUNTER — Inpatient Hospital Stay
Admission: AD | Admit: 2021-11-23 | Discharge: 2021-12-02 | DRG: 292 | Disposition: A | Discharge status: Other Acute Inpatient Hospital | Payer: No Typology Code available for payment source | Attending: Hospitalist | Admitting: Hospitalist

## 2021-11-23 DIAGNOSIS — N179 Acute kidney failure, unspecified: Secondary | ICD-10-CM | POA: Diagnosis present

## 2021-11-23 DIAGNOSIS — Z720 Tobacco use: Secondary | ICD-10-CM | POA: Insufficient documentation

## 2021-11-23 DIAGNOSIS — E876 Hypokalemia: Secondary | ICD-10-CM | POA: Diagnosis present

## 2021-11-23 DIAGNOSIS — J449 Chronic obstructive pulmonary disease, unspecified: Secondary | ICD-10-CM | POA: Diagnosis present

## 2021-11-23 DIAGNOSIS — R52 Pain, unspecified: Secondary | ICD-10-CM | POA: Diagnosis present

## 2021-11-23 DIAGNOSIS — R0902 Hypoxemia: Secondary | ICD-10-CM | POA: Diagnosis present

## 2021-11-23 DIAGNOSIS — F4310 Post-traumatic stress disorder, unspecified: Secondary | ICD-10-CM | POA: Diagnosis present

## 2021-11-23 DIAGNOSIS — G40909 Epilepsy, unspecified, not intractable, without status epilepticus: Secondary | ICD-10-CM | POA: Diagnosis present

## 2021-11-23 DIAGNOSIS — Y92238 Other place in hospital as the place of occurrence of the external cause: Secondary | ICD-10-CM | POA: Diagnosis not present

## 2021-11-23 DIAGNOSIS — R258 Other abnormal involuntary movements: Secondary | ICD-10-CM | POA: Diagnosis not present

## 2021-11-23 DIAGNOSIS — T43595A Adverse effect of other antipsychotics and neuroleptics, initial encounter: Secondary | ICD-10-CM | POA: Diagnosis not present

## 2021-11-23 DIAGNOSIS — F1990 Other psychoactive substance use, unspecified, uncomplicated: Secondary | ICD-10-CM | POA: Diagnosis present

## 2021-11-23 DIAGNOSIS — F250 Schizoaffective disorder, bipolar type: Secondary | ICD-10-CM | POA: Diagnosis present

## 2021-11-23 DIAGNOSIS — E669 Obesity, unspecified: Secondary | ICD-10-CM | POA: Diagnosis present

## 2021-11-23 DIAGNOSIS — E878 Other disorders of electrolyte and fluid balance, not elsewhere classified: Secondary | ICD-10-CM | POA: Diagnosis present

## 2021-11-23 DIAGNOSIS — K5900 Constipation, unspecified: Secondary | ICD-10-CM | POA: Diagnosis present

## 2021-11-23 DIAGNOSIS — R079 Chest pain, unspecified: Secondary | ICD-10-CM | POA: Diagnosis not present

## 2021-11-23 DIAGNOSIS — R45851 Suicidal ideations: Secondary | ICD-10-CM | POA: Diagnosis present

## 2021-11-23 DIAGNOSIS — T1491XA Suicide attempt, initial encounter: Secondary | ICD-10-CM | POA: Diagnosis present

## 2021-11-23 DIAGNOSIS — F17210 Nicotine dependence, cigarettes, uncomplicated: Secondary | ICD-10-CM | POA: Diagnosis present

## 2021-11-23 DIAGNOSIS — I5033 Acute on chronic diastolic (congestive) heart failure: Principal | ICD-10-CM | POA: Diagnosis present

## 2021-11-23 DIAGNOSIS — E039 Hypothyroidism, unspecified: Secondary | ICD-10-CM | POA: Diagnosis present

## 2021-11-23 LAB — BASIC METABOLIC PANEL
ANION GAP: 11 mmol/L (ref 10–22)
BUN (UREA NITROGEN): 13 mg/dL (ref 7–18)
CALCIUM: 9.7 mg/dL (ref 8.5–10.5)
CARBON DIOXIDE: 30 mmol/L (ref 21–32)
CHLORIDE: 97 mmol/L — ABNORMAL LOW (ref 98–107)
CREATININE: 0.8 mg/dL (ref 0.4–1.2)
ESTIMATED GLOMERULAR FILT RATE: >60 mL/min (ref >60)
Glucose Random: 101 mg/dL (ref 74–160)
POTASSIUM: 4.1 mmol/L (ref 3.5–5.1)
SODIUM: 138 mmol/L (ref 136–145)

## 2021-11-23 LAB — CBC, PLATELET & DIFFERENTIAL
ABSOLUTE BASO COUNT: 0 10*3/uL (ref 0.0–0.1)
ABSOLUTE EOSINOPHIL COUNT: 0.2 10*3/uL (ref 0.0–0.8)
ABSOLUTE IMM GRAN COUNT: 0.05 10*3/uL (ref 0.00–0.10)
ABSOLUTE LYMPH COUNT: 3.2 10*3/uL (ref 0.6–5.9)
ABSOLUTE MONO COUNT: 0.7 10*3/uL (ref 0.2–1.4)
ABSOLUTE NEUTROPHIL COUNT: 9 10*3/uL — ABNORMAL HIGH (ref 1.6–8.3)
ABSOLUTE NRBC COUNT: 0 10*3/uL (ref 0.0–0.0)
BASOPHIL %: 0.3 % (ref 0.0–1.2)
EOSINOPHIL %: 1.7 % (ref 0.0–7.0)
HEMATOCRIT: 48.7 % — ABNORMAL HIGH (ref 34.1–44.9)
HEMOGLOBIN: 16.1 g/dL — ABNORMAL HIGH (ref 11.2–15.7)
IMMATURE GRANULOCYTE %: 0.4 % (ref 0.0–1.0)
LYMPHOCYTE %: 24.4 % (ref 15.0–54.0)
MEAN CORP HGB CONC: 33.1 g/dL (ref 31.0–37.0)
MEAN CORPUSCULAR HGB: 31.1 pg (ref 26.0–34.0)
MEAN CORPUSCULAR VOL: 94.2 fl (ref 80.0–100.0)
MEAN PLATELET VOLUME: 11 fL (ref 8.7–12.5)
MONOCYTE %: 5.1 % (ref 4.0–13.0)
NEUTROPHIL %: 68.1 % (ref 40.0–75.0)
NRBC %: 0 % (ref 0.0–0.0)
PLATELET COUNT: 293 10*3/uL (ref 150–400)
RBC DISTRIBUTION WIDTH STD DEV: 50 fL — ABNORMAL HIGH (ref 35.1–46.3)
RED BLOOD CELL COUNT: 5.17 M/uL (ref 3.90–5.20)
WHITE BLOOD CELL COUNT: 13.2 10*3/uL — ABNORMAL HIGH (ref 4.0–11.0)

## 2021-11-23 LAB — SERUM DRUG SCREEN
ACETAMINOPHEN: <5 ug/mL (ref 10–30)
ETHANOL: <10 mg/dL (ref 0–10)
SALICYLATE: <0.5 mg/dL (ref 3.0–20.0)

## 2021-11-23 LAB — COVID-19 INPATIENT: COVID-19 INPATIENT: NEGATIVE

## 2021-11-23 MED ORDER — MELATONIN 3 MG PO TABS
3.0000 mg | ORAL_TABLET | Freq: Every evening | ORAL | Status: DC
Start: 2021-11-23 — End: 2021-12-02
  Administered 2021-11-23 – 2021-12-01 (×9): 3 mg via ORAL
  Filled 2021-11-23 (×9): qty 1

## 2021-11-23 MED ORDER — IPRATROPIUM-ALBUTEROL 0.5-2.5 (3) MG/3ML IN SOLN
9.0000 mL | Freq: Once | RESPIRATORY_TRACT | Status: AC
Start: 2021-11-23 — End: 2021-11-23
  Administered 2021-11-23: 9 mL via RESPIRATORY_TRACT
  Filled 2021-11-23: qty 9

## 2021-11-23 MED ORDER — DULOXETINE HCL 20 MG PO CPEP
40.0000 mg | ORAL_CAPSULE | Freq: Every day | ORAL | Status: DC
  Administered 2021-11-23 – 2021-12-02 (×10): 40 mg via ORAL
  Filled 2021-11-23 (×10): qty 2

## 2021-11-23 MED ORDER — QUETIAPINE FUMARATE 50 MG PO TABS
50.0000 mg | ORAL_TABLET | Freq: Four times a day (QID) | ORAL | Status: DC | PRN
Start: 2021-11-23 — End: 2021-11-27
  Administered 2021-11-24 – 2021-11-27 (×3): 50 mg via ORAL
  Filled 2021-11-23 (×4): qty 1

## 2021-11-23 MED ORDER — TORSEMIDE 20 MG PO TABS
40.0000 mg | ORAL_TABLET | Freq: Every day | ORAL | Status: DC
Start: 2021-11-23 — End: 2021-11-26
  Administered 2021-11-23 – 2021-11-26 (×4): 40 mg via ORAL
  Filled 2021-11-23 (×4): qty 2

## 2021-11-23 MED ORDER — BUDESONIDE-FORMOTEROL FUMARATE 160-4.5 MCG/ACT IN AERO
2.0000 | INHALATION_SPRAY | Freq: Two times a day (BID) | RESPIRATORY_TRACT | Status: DC
Start: 2021-11-23 — End: 2021-12-02
  Administered 2021-11-23 – 2021-12-02 (×14): 2 via RESPIRATORY_TRACT
  Filled 2021-11-23 (×2): qty 6

## 2021-11-23 MED ORDER — ALBUTEROL SULFATE HFA 108 (90 BASE) MCG/ACT IN AERS
2.0000 | INHALATION_SPRAY | Freq: Four times a day (QID) | RESPIRATORY_TRACT | Status: DC | PRN
Start: 2021-11-23 — End: 2021-12-02
  Administered 2021-11-25 (×2): 2 via RESPIRATORY_TRACT
  Filled 2021-11-23 (×2): qty 6.7

## 2021-11-23 MED ORDER — BUPRENORPHINE HCL-NALOXONE HCL 8-2 MG SL SUBL
8.0000 mg | SUBLINGUAL_TABLET | Freq: Three times a day (TID) | SUBLINGUAL | Status: DC
Start: 2021-11-23 — End: 2021-12-02
  Administered 2021-11-23 – 2021-12-02 (×28): 1 via SUBLINGUAL
  Filled 2021-11-23 (×28): qty 1

## 2021-11-23 MED ORDER — FERROUS SULFATE 324 (65 FE) MG PO TBEC
324.0000 mg | DELAYED_RELEASE_TABLET | ORAL | Status: DC
  Administered 2021-11-23 – 2021-12-01 (×5): 324 mg via ORAL
  Filled 2021-11-23 (×5): qty 1

## 2021-11-23 MED ORDER — POTASSIUM CHLORIDE CRYS ER 20 MEQ PO TBCR
20.0000 meq | EXTENDED_RELEASE_TABLET | Freq: Every day | ORAL | Status: DC
Start: 2021-11-23 — End: 2021-11-26
  Administered 2021-11-23 – 2021-11-26 (×4): 20 meq via ORAL
  Filled 2021-11-23 (×4): qty 1

## 2021-11-23 MED ORDER — ACAMPROSATE CALCIUM 333 MG PO TBEC
333.0000 mg | DELAYED_RELEASE_TABLET | Freq: Three times a day (TID) | ORAL | Status: DC
Start: 2021-11-23 — End: 2021-12-02
  Administered 2021-11-23 – 2021-12-02 (×27): 333 mg via ORAL
  Filled 2021-11-23 (×27): qty 1

## 2021-11-23 MED ORDER — GABAPENTIN 300 MG PO CAPS
600.0000 mg | ORAL_CAPSULE | Freq: Three times a day (TID) | ORAL | Status: DC
Start: 2021-11-23 — End: 2021-12-02
  Administered 2021-11-23 – 2021-12-02 (×28): 600 mg via ORAL
  Filled 2021-11-23 (×28): qty 2

## 2021-11-23 MED ORDER — ASPIRIN 81 MG PO CHEW
81.0000 mg | CHEWABLE_TABLET | Freq: Every day | ORAL | Status: DC
Start: 2021-11-23 — End: 2021-12-02
  Administered 2021-11-23 – 2021-12-02 (×10): 81 mg via ORAL
  Filled 2021-11-23 (×10): qty 1

## 2021-11-23 MED ORDER — OXCARBAZEPINE 150 MG PO TABS
150.0000 mg | ORAL_TABLET | Freq: Two times a day (BID) | ORAL | Status: DC
  Administered 2021-11-23 – 2021-12-02 (×19): 150 mg via ORAL
  Filled 2021-11-23 (×19): qty 1

## 2021-11-23 MED ORDER — HALOPERIDOL 5 MG PO TABS
5.0000 mg | ORAL_TABLET | Freq: Three times a day (TID) | ORAL | Status: DC
Start: 2021-11-23 — End: 2021-11-28
  Administered 2021-11-23 – 2021-11-28 (×15): 5 mg via ORAL
  Filled 2021-11-23 (×15): qty 1

## 2021-11-23 MED ORDER — PANTOPRAZOLE SODIUM 40 MG PO TBEC
40.0000 mg | DELAYED_RELEASE_TABLET | Freq: Every day | ORAL | Status: DC
Start: 2021-11-23 — End: 2021-12-02
  Administered 2021-11-23 – 2021-12-02 (×10): 40 mg via ORAL
  Filled 2021-11-23 (×10): qty 1

## 2021-11-23 MED ORDER — ATORVASTATIN CALCIUM 80 MG PO TABS
80.0000 mg | ORAL_TABLET | Freq: Every evening | ORAL | Status: DC
Start: 2021-11-23 — End: 2021-12-02
  Administered 2021-11-23 – 2021-12-01 (×9): 80 mg via ORAL
  Filled 2021-11-23 (×9): qty 1

## 2021-11-23 MED ORDER — LEVOTHYROXINE SODIUM 25 MCG PO TABS
25.0000 ug | ORAL_TABLET | Freq: Every morning | ORAL | Status: DC
  Administered 2021-11-24 – 2021-12-02 (×8): 25 ug via ORAL
  Filled 2021-11-23 (×9): qty 1

## 2021-11-23 MED ORDER — NICOTINE POLACRILEX 2 MG MT GUM
2.0000 mg | CHEWING_GUM | OROMUCOSAL | Status: DC | PRN
Start: 2021-11-23 — End: 2021-12-02
  Administered 2021-11-23 – 2021-12-02 (×49): 2 mg via ORAL
  Filled 2021-11-23 (×48): qty 1

## 2021-11-23 MED ORDER — DOXEPIN HCL 10 MG PO CAPS
10.0000 mg | ORAL_CAPSULE | Freq: Every evening | ORAL | Status: DC
  Administered 2021-11-23 – 2021-12-01 (×9): 10 mg via ORAL
  Filled 2021-11-23 (×9): qty 1

## 2021-11-23 MED ORDER — FLUTICASONE PROPIONATE 50 MCG/ACT NA SUSP
1.0000 | Freq: Every day | NASAL | Status: DC
  Administered 2021-11-23 – 2021-12-02 (×6): 1 via NASAL
  Filled 2021-11-23 (×2): qty 16

## 2021-11-23 MED ORDER — LORATADINE 10 MG PO TABS
10.0000 mg | ORAL_TABLET | Freq: Every day | ORAL | Status: DC
Start: 2021-11-23 — End: 2021-12-02
  Administered 2021-11-23 – 2021-12-02 (×10): 10 mg via ORAL
  Filled 2021-11-23 (×10): qty 1

## 2021-11-23 MED ORDER — IBUPROFEN 600 MG PO TABS
600.0000 mg | ORAL_TABLET | Freq: Once | ORAL | Status: AC
Start: 2021-11-23 — End: 2021-11-23
  Administered 2021-11-23: 600 mg via ORAL
  Filled 2021-11-23: qty 1

## 2021-11-23 MED ORDER — LEVETIRACETAM 500 MG PO TABS
750.0000 mg | ORAL_TABLET | Freq: Two times a day (BID) | ORAL | Status: DC
Start: 2021-11-23 — End: 2021-12-02
  Administered 2021-11-23 – 2021-12-02 (×19): 750 mg via ORAL
  Filled 2021-11-23 (×19): qty 1

## 2021-11-23 MED ORDER — NICOTINE 21 MG/24HR TD PT24
1.0000 | MEDICATED_PATCH | Freq: Every day | TRANSDERMAL | Status: DC
Start: 2021-11-23 — End: 2021-12-02
  Administered 2021-11-24 – 2021-12-02 (×9): 1 via TRANSDERMAL
  Filled 2021-11-23 (×10): qty 1

## 2021-11-23 MED ORDER — DOCUSATE SODIUM 100 MG PO CAPS
100.0000 mg | ORAL_CAPSULE | Freq: Every day | ORAL | Status: DC | PRN
Start: 2021-11-23 — End: 2021-12-02

## 2021-11-23 MED ORDER — IBUPROFEN 400 MG PO TABS
400.0000 mg | ORAL_TABLET | Freq: Four times a day (QID) | ORAL | Status: DC | PRN
Start: 2021-11-23 — End: 2021-12-01
  Administered 2021-11-25 – 2021-12-01 (×9): 400 mg via ORAL
  Filled 2021-11-23 (×10): qty 1

## 2021-11-23 MED ORDER — METHOCARBAMOL 500 MG PO TABS
750.0000 mg | ORAL_TABLET | Freq: Three times a day (TID) | ORAL | Status: DC
Start: 2021-11-23 — End: 2021-12-02
  Administered 2021-11-23 – 2021-12-02 (×28): 750 mg via ORAL
  Filled 2021-11-23 (×28): qty 2
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
## 2021-11-23 NOTE — Narrator Note (Signed)
Pt resting on stretcher with eyes closed. Visible chest rise and fall noted, RR even and unlabored. Pt is currently under 4:1 sitter line of sight.

## 2021-11-23 NOTE — Narrator Note (Signed)
Supervisor from group home: [phone]

## 2021-11-23 NOTE — Narrator Note (Signed)
Pt requesting her daily meds, states she uses [facility]. Called [facility], states they don't have this pt. Informed pt this. Pt states she use [facility]. Pt also states she has med list on her phone, but her phone has no power, is charging at the nurse's station.

## 2021-11-23 NOTE — ED Notes (Signed)
ED Attending Physician Sign Out Note     I received sign out from: Dr. Verner Chol at 0700.    ED Course as of 11/23/21 1437   Tue Nov 23, 2021   1437 Disposition planning discussed with crisis team. We are in agreement on the plan for the patient: bed search for inpatient level of care.        1258 Patient complaining of some shortness of breath, she has a history of COPD, she states it feels like her COPD.  Plan for duo nebs, patient was just recently ordered for her home meds which include COPD medications.  EKG was ordered by nursing secondary to the shortness of breath, EKG is sinus rhythm at a rate of 85, no signs of acute ischemia or arrhythmia.  QTc is 502   1137 Home meds per group home med list ordered   [phone] F pw chronic pain and SI awaiting [name] eval     Disposition     Diagnosis:   Suicidal ideations    Disposition:   PENDING    Atilano Median, MD  Emergency Department Attending [place]

## 2021-11-23 NOTE — Narrator Note (Addendum)
Pt resting on stretcher with eyes closed. Visible chest rise and fall noted, RR even and unlabored. Pt is currently under 4:1 sitter line of sight.

## 2021-11-23 NOTE — Narrator Note (Signed)
Report given to RN. Care of pt is transferred at this time. Pt is currently in psych safe room with 4:1 sitter in line of sight. Pt is AOx4, visible chest rise and fall noted, RR equal and unlabored, in no apparent distress at time of handoff.

## 2021-11-23 NOTE — Narrator Note (Signed)
Pt asking for motrin, MD aware

## 2021-11-23 NOTE — Narrator Note (Signed)
Pt awake and asking for nighttime meds

## 2021-11-23 NOTE — Narrator Note (Signed)
Safety lunch tray and beverage provided to pt. Pt denies any further needs at this time. Visible chest rise and fall noted, RR even and unlabored, in no apparent distress at this time. Pt is under direct view of 4:1 sitter.

## 2021-11-23 NOTE — Narrator Note (Signed)
Report given to Jenny M, RN
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
## 2021-11-23 NOTE — Narrator Note (Signed)
Pt requesting nicotine patch. MD aware.

## 2021-11-23 NOTE — Narrator Note (Signed)
Patient changed into hospital gown and belongings secured  Labs and covid specimen drawn and sent

## 2021-11-23 NOTE — Narrator Note (Signed)
RN giving handoff Jyothi, RN  RN receiving handoff Guadelupe Sabin, RN  Section 12: No  Is patient on Safety Watch Yes  ED Room Safety Checklist Documented: Yes  Patient Belongings Documented: Yes  # Bins, personal belongings reconciled: Yes  Search Documented Yes  Outstanding orders/meds addressed Yes  Inpatient Bed Search pending psych eval  Plan: awaiting psych eval

## 2021-11-23 NOTE — Narrator Note (Signed)
Pt requesting her phone from belongings, phone given to pt. Vitals updated. Breakfast tray provided.  [name] at bedside

## 2021-11-23 NOTE — Narrator Note (Signed)
Safety dinner tray and beverage provided to pt. Pt denies any further needs at this time. Visible chest rise and fall noted, RR even and unlabored, in no apparent distress at this time. Pt is under direct view of 4:1 sitter.

## 2021-11-23 NOTE — Narrator Note (Signed)
Pt ambulated to and from restroom without difficulty at this time. Upon return to room pt is now resting quietly on stretcher. Visible chest rise and fall noted, RR even and unlabored at this time. Pt is in view of 4:1 sitter.

## 2021-11-23 NOTE — Narrator Note (Signed)
Pt states that she is "having a hard time breathing". Pt states that she has a "tighness on her chest". Pt reports that she has asthma and COPD and this happens regularly for her. Pt states that she wears 2L of O2 when she has these symptoms. Pt's VS were taken and pt had an oxygen saturation between 88-90% on RA. Pt was moved to room and placed on 2L NC at this time. EKG order placed.

## 2021-11-23 NOTE — Narrator Note (Signed)
Report received at this time and care of pt assumed. Pt is currently resting on stretcher in hallway E1. Visible chest rise and fall noted, RR even and unlabored. Environmental safety check performed in pt's room at this time. Pt currently has 4:1 sitter.

## 2021-11-23 NOTE — ED Notes (Signed)
Assumed care of pt at 3 pm.    ED Course as of 11/23/21 1517   Tue Nov 23, 2021   1503 51 yo F with suicidal ideation and chronic pain. IPLOC bed search ongoing.        Burns Spain, MD  Attending Physician  [facility] Department of Emergency Medicine

## 2021-11-24 ENCOUNTER — Emergency Department (HOSPITAL_BASED_OUTPATIENT_CLINIC_OR_DEPARTMENT_OTHER): Payer: No Typology Code available for payment source

## 2021-11-24 ENCOUNTER — Encounter (HOSPITAL_BASED_OUTPATIENT_CLINIC_OR_DEPARTMENT_OTHER): Payer: Self-pay

## 2021-11-24 DIAGNOSIS — J9811 Atelectasis: Secondary | ICD-10-CM

## 2021-11-24 DIAGNOSIS — R0602 Shortness of breath: Secondary | ICD-10-CM | POA: Diagnosis not present

## 2021-11-24 LAB — ADD ON CAN'T BE DONE HEMATOL

## 2021-11-24 LAB — URINE DRUG SCREEN
AMPHETAMINES URINE: NEGATIVE ng/mL
BENZODIAZEPINES URINE: NEGATIVE ng/mL
BUPRENORPHINE SCREEN URINE: POSITIVE ng/mL — AB
CANNABINOIDS URINE: NEGATIVE ng/mL
COCAINE METABOLITES URINE: POSITIVE ng/mL — AB
ETHANOL URINE: NEGATIVE mg/dL
FENTANYL URINE: POSITIVE ng/mL — AB
METHADONE URINE: NEGATIVE ng/mL
OPIATES URINE: NEGATIVE ng/mL
OXYCOD SCRN URINE: NEGATIVE ng/mL
SPEC VALIDITY SPECIFIC GRAVITY: 1.01 (ref 1.003–1.035)
SPECIMEN VALIDITY URINE CREAT: 46 mg/dL (ref >20)
SPECIMEN VALIDITY URINE PH: 6 (ref 5.0–8.5)

## 2021-11-24 LAB — D-DIMER PE/DVT, QUANTITATIVE: D-DIMER PE/DVT, QUANTITATIVE: 498 ng/mLFEU (ref 0.00–499)

## 2021-11-24 LAB — TROPONIN T HS 1 HOUR
DELTA 1 HOUR TROPONIN T HS: 0 ng/L (ref 0–4)
TROPONIN T HS 1 HOUR RESULT: 14 ng/L — ABNORMAL HIGH (ref 0–10)

## 2021-11-24 LAB — TROPONIN T HS 3 HOUR
DELTA 3 HOUR TROPONIN T HS: 1 ng/L (ref 0–7)
TROPONIN T HS 3 HOUR RESULT: 13 ng/L — ABNORMAL HIGH (ref 0–10)

## 2021-11-24 LAB — TSH (THYROID STIMULATING HORMONE): TSH (THYROID STIM HORMONE): 3.22 u[IU]/mL (ref 0.270–4.200)

## 2021-11-24 LAB — BASIC METABOLIC PANEL
ANION GAP: 9 mmol/L — ABNORMAL LOW (ref 10–22)
BUN (UREA NITROGEN): 14 mg/dL (ref 7–18)
CALCIUM: 9.1 mg/dL (ref 8.5–10.5)
CARBON DIOXIDE: 32 mmol/L (ref 21–32)
CHLORIDE: 97 mmol/L — ABNORMAL LOW (ref 98–107)
CREATININE: 0.7 mg/dL (ref 0.4–1.2)
ESTIMATED GLOMERULAR FILT RATE: >60 mL/min (ref >60)
Glucose Random: 132 mg/dL (ref 74–160)
POTASSIUM: 3.7 mmol/L (ref 3.5–5.1)
SODIUM: 138 mmol/L (ref 136–145)

## 2021-11-24 LAB — NT-PROBNP: NT-proBNP: <36 pg/mL (ref 0–125)

## 2021-11-24 LAB — TROPONIN T HS BASELINE: TROPONIN T HS BASELINE: 14 ng/L — ABNORMAL HIGH (ref 0–10)

## 2021-11-24 LAB — COVID-19 RAPID: COVID-19 RAPID: NEGATIVE

## 2021-11-24 MED ORDER — LORAZEPAM 2 MG/ML IJ SOLN
1.0000 mg | Freq: Once | INTRAMUSCULAR | Status: AC
Start: 2021-11-24 — End: 2021-11-24
  Administered 2021-11-24: 1 mg via INTRAVENOUS
  Filled 2021-11-24: qty 1

## 2021-11-24 MED ORDER — FUROSEMIDE 10 MG/ML IJ SOLN
40.00 mg | Freq: Once | INTRAMUSCULAR | Status: AC
Start: 2021-11-24 — End: 2021-11-24
  Administered 2021-11-24: 40 mg via INTRAVENOUS
  Filled 2021-11-24: qty 4

## 2021-11-24 MED ORDER — PREDNISONE 50 MG PO TABS
60.0000 mg | ORAL_TABLET | Freq: Once | ORAL | Status: AC
Start: 2021-11-24 — End: 2021-11-24
  Administered 2021-11-24: 60 mg via ORAL
  Filled 2021-11-24: qty 1

## 2021-11-24 NOTE — ED Notes (Signed)
ED Attending Sign-out Note  ED Course as of 11/24/21 2102   Wed Nov 24, 2021   2101 Case d/w [name] - pt will be geri psych bed search.   1955 Case d/w hospitalist - they are going to discuss ultimate dispo with [facility].   1759 Pt states uses prn home O2 at 2L, currently stable on those settings. Other workup negative, pt is very obese and suspect this is significant factor, does not appear to have acute CHF or COPD exacerbation and would be medically stable for discharge home at this time. However, cannot discharge as pt remains suicidal. Case d/w [name] and they are going to confirm whether pt on home O2 can ever be placed psychiatrically or if medical admit is required.   1711 D-dimer negative. Pt attempted to wrap O2 tubing around neck, disentangled, placed on 1:1 watch. Planning admit to medicine.   1556 11/22 ED notes reviewed - pt had c/o chest pain, d-dimer was elevated and CT PE planned but pt signed out AMA. As NT-proBNP is negative and CXR without clear cause hypoxia, will obtain d-dimer.   1530 XR Chest Portable  Bibasilar atelectasis   1524 EKG : Initial  My EKG read: NSR@82 , no acute ST changes, no e/o ACS or severe electrolyte disturbance     1522 Signout: chronic pain, SI, seizure disorder, CHF, noted hypoxic last night requiring 2L, likely CHF so now will admit to medicine         Impression:  Suicidal ideations  (primary encounter diagnosis)  Hypoxia    Disposition:  Admit To [facility] by Velva Harman, MD  Emergency Medicine Attending  [facility]

## 2021-11-24 NOTE — Narrator Note (Signed)
Patient removed NC and satting 85-86% on room air. NC placed and pt on 2.5L, sats up to 92-94%. Patient reports SOB with exertion. MD Luberta Robertson made aware and additional orders placed. Patient to remain on supplemental oxygen and to be admitted medically to inpatient.

## 2021-11-24 NOTE — Narrator Note (Signed)
RN giving handoff Chinonye  RN receiving handoff Shanda Bumps  Section 12: Yes  Is patient on Safety Watch Yes  ED Room Safety Checklist Documented: Yes  Patient Belongings Documented: Yes  # Bins, personal belongings reconciled: Yes  Search Documented Yes  Outstanding orders/meds addressed Yes  Inpatient Bed Search Yes  Plan: inpatient bed search in progress. Patient remains on 4:1 safety watch.     Patient calm and appropriate. On 2L nc, RR even and unlabored.

## 2021-11-24 NOTE — Narrator Note (Addendum)
Assumed care of pt. Pt's sleeping, even unlabored breathing noted. On 2L O2 via NC. 1:1 constant safety obs maintained. Pending dispo.

## 2021-11-24 NOTE — Narrator Note (Signed)
Pt asking to speak with this RN. Patient states " I'm hallucinating and hearing my son's voice".

## 2021-11-24 NOTE — Narrator Note (Signed)
Piv placed, blood obtained and sent for analysis. Patient remains on 4:1 safety watch and on 2L nc. Vss. Nard.

## 2021-11-24 NOTE — PES NOTES (Signed)
Event note:    ~6pm, spoke with Dr. Danelle Earthly, who explained patient did not have any symptoms of COPD/CHF exacerbation, and is on her home O2. He asked if we could possibly do O2 on our inpatient psych, I explained we cannot.  He admitted to medicine.  -soon after spoke with medicine hospitalist, who explained this patient does not meet hospitalization criteria, as Dr. Danelle Earthly stated they were medically cleared.    -I called my SOC Dr. Elberta Spaniel, explained situation.  Discussed medical clearance and possibility of switching search to geri (since they can do oxygen there).  -Spoke with patient, who stated she has some difficulty breathing that is worse than at home. However, I spoke with Dr. Danelle Earthly about concerns this, and patient had explained to him that patient had been speaking with her outpatient doctor regarding increasing her PRN O2 to standing home O2. Also, WBC count is normal for patient's presentation, along with actelectasis on x-ray.    -Therefore, with approval from my [facility], I switched patient from medical admission to [facility] psych admission.  Updated medical team, ED, transfer center, and called pt's insurance.    Marvel Plan, MD  PGY2 Psychiatry  [phone]

## 2021-11-24 NOTE — Narrator Note (Signed)
[name] bedside for evaluation.

## 2021-11-24 NOTE — Narrator Note (Signed)
Report given to RN Eno for continued care. Patient on 1:1 watch.

## 2021-11-24 NOTE — ED Notes (Signed)
I received signout on this patient from Dr. Cyril Mourning    Briefly, this is a 51 year old female who presents with SI and chronic    Patient was medically screened by provider on prior shift and determined not to have any acute or emergent medical needs at this time.    Patient has been evaluated by [name] and determined to require inpatient level of care    Plan:  -Psych bed search    ED Course as of 11/26/21 0010   Fri Nov 26, 2021   0009 51 yo F p/w SI and chornic pain. Med Cleared, awaiting [facility]   Wed Nov 24, 2021   2332 51 yo F p/w chronic pain and SI. Patient became hypoxic 91% and has history of CHF exacerbation, however work up reassuring. Patient reveals she has home O2 (2L), and once she was placed on this is now baseline. Medically clear. Will plan for geripsych admission   0028 51 yo F p/w Si and chronic pain. Med cleared. IPLOC     Signed out to Dr. Luberta Robertson at 7 AM, pending psych bed search    Impression:  Suicidal ideations    Disposition:  Psych Bed Search    Eyvonne Left, MD, MPH  Attending Physician  Emergency Department  [facility]

## 2021-11-24 NOTE — Narrator Note (Signed)
While in 4:1 patient watch, patient attempted to wrap oxygen tubing and pulse ox tubing around neck shouting "leave me alone. Leave me alone! My dead son wont leave me alone!". This RN, Probation officer,  additional RN, and sitter to patient bedside to assume patient safety. Patient airway patent, nard, RR even and unlabored. No s/s of trauma or injury to patient. Patient placed on 1:1 safety watch and sitter to remain at patient's bedside. MD Seufert aware of situation and patient medicated with 1mg  IV ativan. Patient to remain on 2l NC with 1:1 watch. Plan for medical/psych dual admission with 1:1 watch in place.

## 2021-11-24 NOTE — Narrator Note (Signed)
Patient ambulated to bathroom using rolling walker to provide urine spec. Urine obtained and sent to lab for analysis. Patient remains on 4:1 safety watch and awaits dispo.

## 2021-11-24 NOTE — Narrator Note (Signed)
Patient remains on 1:1 watch at this time. RR even and unlabored. Nard. Repeat trop obtained and sent to lab for analysis. Wctm closely.

## 2021-11-24 NOTE — Narrator Note (Signed)
Patient medicated per MAR, tolerated well without difficulty. Patient remains on 2L NC, satting at 94%. Patient in no acute distress. Awaiting [name] reeval and IPLOC bed for placement. Patient remains on 4:1 safety watch.
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
## 2021-11-24 NOTE — Narrator Note (Signed)
Pt up to restroom.

## 2021-11-24 NOTE — Narrator Note (Signed)
Pt sleeping. Breathing even and unlabored.

## 2021-11-24 NOTE — Narrator Note (Signed)
EKG obtained and shown to MD [facility]. EDMD at bedside for update.

## 2021-11-24 NOTE — Narrator Note (Signed)
Pt awake asking to speak with this RN." I'm scared, I just had a nightmare about my dead son". Emotional support and reassurance provided. Patient appears more relaxed at this time.

## 2021-11-24 NOTE — Narrator Note (Signed)
Pt received Psychologist, forensic

## 2021-11-24 NOTE — PES NOTES (Signed)
[name] Notes:    Insurance Auth provided by [employer]  # [id number]  Days 3/8-3/13/23    Berenice Primas, LICSW  [name] ext. [phone]

## 2021-11-24 NOTE — ED Notes (Signed)
I received signout on this patient from Dr. Danelle Earthly    Briefly, this is a 51 year old female who presents with chronic pain and SI.  On home 2 L nasal cannula.    Patient was medically screened by provider on prior shift and determined not to have any acute or emergent medical needs at this time.    Patient has been evaluated by [name] and determined to require inpatient level of care    Plan:  -Psych bed search, [facility] psych    ED Course as of 11/26/21 0010   Fri Nov 26, 2021   0009 51 yo F p/w SI and chornic pain. Med Cleared, awaiting [facility]   Wed Nov 24, 2021   2332 51 yo F p/w chronic pain and SI. Patient became hypoxic 91% and has history of CHF exacerbation, however work up reassuring. Patient reveals she has home O2 (2L), and once she was placed on this is now baseline. Medically clear. Will plan for geripsych admission   0028 51 yo F p/w Si and chronic pain. Med cleared. IPLOC     Signed out to Dr. Marland Mcalpine at 7 AM, pending psych bed search    Impression:  Suicidal ideations  (primary encounter diagnosis)  Hypoxia    Disposition:  Psych Bed Search    Eyvonne Left, MD, MPH  Attending Physician  Emergency Department  [facility]

## 2021-11-24 NOTE — ED Notes (Addendum)
Patient signed out at 75 AM  51 year old presenting with suicidal ideation and chronic pain.  Patient is awaiting inpatient psychiatry bed.    Patient has been hypoxic overnight and is still on 2 L of fluid.  Patient reports she has been short of breath for couple days.  Per chart review patient has a history of HFpEF, COPD seizure disorder on Keppra.  Patient has been admitted last time 09/07/2021 for CHF was given IV diuresis.  Patient reports the shortness of breath has been a few days with some chest pressure.  EKG sinus rate of 82 no ST elevations or depressions normal intervals.  We will get chest x-ray and labs tropes and BNP.  Patient will need IV diuresis.  Patient will need a medicine admission.

## 2021-11-25 LAB — EKG

## 2021-11-25 MED ORDER — LORAZEPAM 1 MG PO TABS
1.0000 mg | ORAL_TABLET | Freq: Once | ORAL | Status: AC
Start: 2021-11-25 — End: 2021-11-25
  Administered 2021-11-25: 1 mg via ORAL
  Filled 2021-11-25: qty 1

## 2021-11-25 NOTE — ED Nursing Notes (Signed)
Pt is resting comfortably, currently on 2L O2 via NC, bed in lowest position, 1:1 sitter in room. Safety check done in room during beginning of shift.

## 2021-11-25 NOTE — Narrator Note (Signed)
Pt's awake, sitting on stretcher, oriented x 4. Pt continues on 2L O2 via NC.  NAD noted at this time. 1:1 safety obs maintained. Pending IPLOC.

## 2021-11-25 NOTE — Narrator Note (Signed)
Patient is resting comfortably. 

## 2021-11-25 NOTE — Narrator Note (Signed)
Pt sleeping will return for 1pm meds.

## 2021-11-25 NOTE — Narrator Note (Signed)
Pt complaining of SOB. Pt's oxygen remains 92% on 2L NC. Pt give albuterol inhaler.
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
## 2021-11-25 NOTE — ED Notes (Signed)
Patient was signed out to me by prior ED staff. Please refer to their documentation for full history and physical exam.     Patient was signed out to me pending inpatient bed search.  Patient presented with suicidal ideations and chronic pain.  Of note patient was noted to be saturating at 91% and is on 2 L nasal cannula on prior shift.  She has a history of CHF exacerbation but did not require medical admission.  She was observed during my shift without any issues. Patient was signed out to Dr. Dedra Skeens pending inpatient bed search.

## 2021-11-25 NOTE — ED Notes (Addendum)
[facility] EM Attending Sign Out Note    I received signout on this patient from my colleague at shift change.    ED Course as of 11/25/21 1519   Thu Nov 25, 2021   1500 SI, chronic pain, IPLOC     Plan at time of signout was to observe in the ED.     Condition: Stable    Diagnoses:  (D40.814) Suicidal ideations  (primary encounter diagnosis)    (R09.02) Hypoxia    Deanna Artis DO MPH  Attending Physician  [facility]

## 2021-11-26 ENCOUNTER — Encounter (HOSPITAL_BASED_OUTPATIENT_CLINIC_OR_DEPARTMENT_OTHER): Payer: Self-pay | Admitting: Hospitalist

## 2021-11-26 DIAGNOSIS — S0990XA Unspecified injury of head, initial encounter: Secondary | ICD-10-CM | POA: Diagnosis not present

## 2021-11-26 DIAGNOSIS — W19XXXA Unspecified fall, initial encounter: Secondary | ICD-10-CM | POA: Diagnosis not present

## 2021-11-26 DIAGNOSIS — E039 Hypothyroidism, unspecified: Secondary | ICD-10-CM | POA: Diagnosis present

## 2021-11-26 DIAGNOSIS — G4733 Obstructive sleep apnea (adult) (pediatric): Secondary | ICD-10-CM | POA: Diagnosis not present

## 2021-11-26 DIAGNOSIS — F319 Bipolar disorder, unspecified: Secondary | ICD-10-CM | POA: Diagnosis not present

## 2021-11-26 DIAGNOSIS — F419 Anxiety disorder, unspecified: Secondary | ICD-10-CM | POA: Diagnosis not present

## 2021-11-26 DIAGNOSIS — R079 Chest pain, unspecified: Secondary | ICD-10-CM | POA: Diagnosis not present

## 2021-11-26 DIAGNOSIS — N179 Acute kidney failure, unspecified: Secondary | ICD-10-CM | POA: Diagnosis present

## 2021-11-26 DIAGNOSIS — F17210 Nicotine dependence, cigarettes, uncomplicated: Secondary | ICD-10-CM | POA: Diagnosis present

## 2021-11-26 DIAGNOSIS — M79643 Pain in unspecified hand: Secondary | ICD-10-CM | POA: Diagnosis not present

## 2021-11-26 DIAGNOSIS — R52 Pain, unspecified: Secondary | ICD-10-CM | POA: Diagnosis present

## 2021-11-26 DIAGNOSIS — Z6841 Body Mass Index (BMI) 40.0 and over, adult: Secondary | ICD-10-CM | POA: Diagnosis not present

## 2021-11-26 DIAGNOSIS — R0902 Hypoxemia: Secondary | ICD-10-CM | POA: Diagnosis not present

## 2021-11-26 DIAGNOSIS — R258 Other abnormal involuntary movements: Secondary | ICD-10-CM | POA: Diagnosis not present

## 2021-11-26 DIAGNOSIS — K2970 Gastritis, unspecified, without bleeding: Secondary | ICD-10-CM | POA: Diagnosis not present

## 2021-11-26 DIAGNOSIS — I5022 Chronic systolic (congestive) heart failure: Secondary | ICD-10-CM | POA: Diagnosis not present

## 2021-11-26 DIAGNOSIS — I5033 Acute on chronic diastolic (congestive) heart failure: Secondary | ICD-10-CM | POA: Diagnosis present

## 2021-11-26 DIAGNOSIS — K219 Gastro-esophageal reflux disease without esophagitis: Secondary | ICD-10-CM | POA: Diagnosis not present

## 2021-11-26 DIAGNOSIS — F251 Schizoaffective disorder, depressive type: Secondary | ICD-10-CM | POA: Diagnosis not present

## 2021-11-26 DIAGNOSIS — E876 Hypokalemia: Secondary | ICD-10-CM | POA: Diagnosis present

## 2021-11-26 DIAGNOSIS — R0689 Other abnormalities of breathing: Secondary | ICD-10-CM | POA: Diagnosis not present

## 2021-11-26 DIAGNOSIS — R0600 Dyspnea, unspecified: Secondary | ICD-10-CM | POA: Diagnosis not present

## 2021-11-26 DIAGNOSIS — F4310 Post-traumatic stress disorder, unspecified: Secondary | ICD-10-CM | POA: Diagnosis present

## 2021-11-26 DIAGNOSIS — E781 Pure hyperglyceridemia: Secondary | ICD-10-CM | POA: Diagnosis not present

## 2021-11-26 DIAGNOSIS — F1990 Other psychoactive substance use, unspecified, uncomplicated: Secondary | ICD-10-CM | POA: Diagnosis present

## 2021-11-26 DIAGNOSIS — F4321 Adjustment disorder with depressed mood: Secondary | ICD-10-CM | POA: Diagnosis not present

## 2021-11-26 DIAGNOSIS — M25562 Pain in left knee: Secondary | ICD-10-CM | POA: Diagnosis not present

## 2021-11-26 DIAGNOSIS — J449 Chronic obstructive pulmonary disease, unspecified: Secondary | ICD-10-CM | POA: Diagnosis present

## 2021-11-26 DIAGNOSIS — Y92238 Other place in hospital as the place of occurrence of the external cause: Secondary | ICD-10-CM | POA: Diagnosis not present

## 2021-11-26 DIAGNOSIS — E878 Other disorders of electrolyte and fluid balance, not elsewhere classified: Secondary | ICD-10-CM | POA: Diagnosis present

## 2021-11-26 DIAGNOSIS — T43595A Adverse effect of other antipsychotics and neuroleptics, initial encounter: Secondary | ICD-10-CM | POA: Diagnosis not present

## 2021-11-26 DIAGNOSIS — K5900 Constipation, unspecified: Secondary | ICD-10-CM | POA: Diagnosis present

## 2021-11-26 DIAGNOSIS — R45851 Suicidal ideations: Secondary | ICD-10-CM | POA: Diagnosis present

## 2021-11-26 DIAGNOSIS — E669 Obesity, unspecified: Secondary | ICD-10-CM | POA: Diagnosis present

## 2021-11-26 DIAGNOSIS — I5030 Unspecified diastolic (congestive) heart failure: Secondary | ICD-10-CM | POA: Diagnosis not present

## 2021-11-26 DIAGNOSIS — F250 Schizoaffective disorder, bipolar type: Secondary | ICD-10-CM | POA: Diagnosis present

## 2021-11-26 DIAGNOSIS — R0602 Shortness of breath: Secondary | ICD-10-CM | POA: Diagnosis not present

## 2021-11-26 DIAGNOSIS — G8929 Other chronic pain: Secondary | ICD-10-CM | POA: Diagnosis not present

## 2021-11-26 DIAGNOSIS — Z59819 Housing instability, housed unspecified: Secondary | ICD-10-CM | POA: Diagnosis not present

## 2021-11-26 DIAGNOSIS — G40909 Epilepsy, unspecified, not intractable, without status epilepticus: Secondary | ICD-10-CM | POA: Diagnosis present

## 2021-11-26 DIAGNOSIS — F1910 Other psychoactive substance abuse, uncomplicated: Secondary | ICD-10-CM | POA: Diagnosis not present

## 2021-11-26 DIAGNOSIS — G2402 Drug induced acute dystonia: Secondary | ICD-10-CM | POA: Diagnosis not present

## 2021-11-26 LAB — CBC, PLATELET & DIFFERENTIAL
ABSOLUTE BASO COUNT: 0 10*3/uL (ref 0.0–0.1)
ABSOLUTE EOSINOPHIL COUNT: 0.2 10*3/uL (ref 0.0–0.8)
ABSOLUTE IMM GRAN COUNT: 0.04 10*3/uL (ref 0.00–0.10)
ABSOLUTE LYMPH COUNT: 3.3 10*3/uL (ref 0.6–5.9)
ABSOLUTE MONO COUNT: 0.7 10*3/uL (ref 0.2–1.4)
ABSOLUTE NEUTROPHIL COUNT: 5.8 10*3/uL (ref 1.6–8.3)
ABSOLUTE NRBC COUNT: 0 10*3/uL (ref 0.0–0.0)
BASOPHIL %: 0.3 % (ref 0.0–1.2)
EOSINOPHIL %: 1.5 % (ref 0.0–7.0)
HEMATOCRIT: 47.7 % — ABNORMAL HIGH (ref 34.1–44.9)
HEMOGLOBIN: 14.8 g/dL (ref 11.2–15.7)
IMMATURE GRANULOCYTE %: 0.4 % (ref 0.0–1.0)
LYMPHOCYTE %: 32.7 % (ref 15.0–54.0)
MEAN CORP HGB CONC: 31 g/dL (ref 31.0–37.0)
MEAN CORPUSCULAR HGB: 30.5 pg (ref 26.0–34.0)
MEAN CORPUSCULAR VOL: 98.4 fl (ref 80.0–100.0)
MEAN PLATELET VOLUME: 11.7 fL (ref 8.7–12.5)
MONOCYTE %: 6.8 % (ref 4.0–13.0)
NEUTROPHIL %: 58.3 % (ref 40.0–75.0)
NRBC %: 0 % (ref 0.0–0.0)
PLATELET COUNT: 308 10*3/uL (ref 150–400)
RBC DISTRIBUTION WIDTH STD DEV: 54.3 fL — ABNORMAL HIGH (ref 35.1–46.3)
RED BLOOD CELL COUNT: 4.85 M/uL (ref 3.90–5.20)
WHITE BLOOD CELL COUNT: 10 10*3/uL (ref 4.0–11.0)

## 2021-11-26 LAB — BASIC METABOLIC PANEL
ANION GAP: 11 mmol/L (ref 10–22)
BUN (UREA NITROGEN): 22 mg/dL — ABNORMAL HIGH (ref 7–18)
CALCIUM: 9.7 mg/dL (ref 8.5–10.5)
CARBON DIOXIDE: 33 mmol/L — ABNORMAL HIGH (ref 21–32)
CHLORIDE: 99 mmol/L (ref 98–107)
CREATININE: 0.7 mg/dL (ref 0.4–1.2)
ESTIMATED GLOMERULAR FILT RATE: >60 mL/min (ref >60)
Glucose Random: 126 mg/dL (ref 74–160)
POTASSIUM: 3.8 mmol/L (ref 3.5–5.1)
SODIUM: 143 mmol/L (ref 136–145)

## 2021-11-26 LAB — EKG

## 2021-11-26 LAB — NT-PROBNP: NT-proBNP: <36 pg/mL (ref 0–125)

## 2021-11-26 MED ORDER — HEPARIN SODIUM (PORCINE) 5000 UNIT/ML IJ SOLN
5000.0000 [IU] | Freq: Two times a day (BID) | INTRAMUSCULAR | Status: DC
Start: 2021-11-26 — End: 2021-12-02
  Administered 2021-11-29 – 2021-12-01 (×3): 5000 [IU] via SUBCUTANEOUS
  Filled 2021-11-26 (×7): qty 1

## 2021-11-26 MED ORDER — SENNOSIDES 8.6 MG PO TABS
17.2000 mg | ORAL_TABLET | Freq: Every evening | ORAL | Status: DC | PRN
Start: 2021-11-26 — End: 2021-12-02

## 2021-11-26 MED ORDER — DICLOFENAC SODIUM 1 % EX GEL
4.0000 g | Freq: Four times a day (QID) | CUTANEOUS | Status: DC | PRN
Start: 2021-11-26 — End: 2021-12-02
  Administered 2021-11-30 (×2): 4 g via TOPICAL
  Filled 2021-11-26: qty 50

## 2021-11-26 NOTE — ED Nursing Notes (Signed)
Pt had c/o pain to legs and back. Pain medication given. Pt  Is currently sitting in chair next to bed. Sitter remains 1:1 with patient. Pt offered snack/drink

## 2021-11-26 NOTE — ED Notes (Signed)
I received signout on this patient from Dr. Dedra Skeens    Briefly, this is a 51 year old female who presents with SI and chronic pain.    Patient was medically screened by provider on prior shift and determined not to have any acute or emergent medical needs at this time.    Patient has been evaluated by [name] and determined to require inpatient level of care    Plan:  -Psych bed search    ED Course as of 11/26/21 0017   Fri Nov 26, 2021   0009 51 yo F p/w SI and chornic pain. Med Cleared, awaiting [facility]   Wed Nov 24, 2021   2332 51 yo F p/w chronic pain and SI. Patient became hypoxic 91% and has history of CHF exacerbation, however work up reassuring. Patient reveals she has home O2 (2L), and once she was placed on this is now baseline. Medically clear. Will plan for geripsych admission   0028 51 yo F p/w Si and chronic pain. Med cleared. IPLOC     Signed out to Dr. Marland Mcalpine at 7 AM, pending psych bed search    Impression:  Suicidal ideations  (primary encounter diagnosis)  Hypoxia    Disposition:  Psych Bed Search    Eyvonne Left, MD, MPH  Attending Physician  Emergency Department  [facility]
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
## 2021-11-26 NOTE — ED Notes (Signed)
Patient was signed out to me by prior ED staff. Please refer to their documentation for full history and physical exam.     Patient was signed out to me pending Patient presented with suicidal ideations. She was observed during my shift without any issues.  I spoke with psychiatry service who have recommend the patient be admitted to medicine service given that at this time there will be no beds available until next week.  Patient also has a new continuous oxygen requirement.  Patient normally takes 2 L as needed at home.  Her lungs are clear to auscultation.  Labs will be repeated.  I spoke with hospitalist service who agree with plan to admit the patient until psychiatric bed is available.  Patient was admitted to medicine service in stable condition.

## 2021-11-26 NOTE — Plan of Care (Signed)
Problem: Daily Care  Goal: Daily care needs are met  Description: Assess and monitor ability to perform self care and identify potential discharge needs.  11/26/2021 1923 by Heath Lark, RN  Outcome: Progressing  11/26/2021 1923 by Heath Lark, RN  Reactivated  11/26/2021 1923 by Heath Lark, RN  Outcome: Progressing     Problem: Tobacco/Nicotine Abuse  Goal: Risk control - tobacco abuse  Description: Actions to eliminate or reduce tobacco use.  11/26/2021 1929 by Heath Lark, RN  Outcome: Progressing  11/26/2021 1923 by Heath Lark, RN  Outcome: Progressing  Note: Received patient via w/c from [facility] ED at 5:15pm.  VS's = 98.3, 78, 18, 125/83, 93% 02 sat on 2 liters via NC.  Patient on one to one sitter for suicidal ideation.   Patient alert but agitated easily.  Patient ate regular diet well via safety tray.  Room sweep done.  Patient given one nicorette gum as ordered.   Patient sleeping in naps at present without c/o's.  One to one sitter in room with patient at all times.  Report given to [place] night RN.    11/26/2021 1923 by Heath Lark, RN  Reactivated  11/26/2021 1923 by Heath Lark, RN  Outcome: Progressing
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
## 2021-11-26 NOTE — Plan of Care (Addendum)
Pt A&Ox3, disoriented to time. Remains on 1:1 observation for SI. VS stable. Dyspnea on exertion noted. Remains on 2L NC. BS present. Refused full skin assessment. Back & coccyx skin intact. Generalized 10/10 pain. See MAR for pain meds. Denies nausea.     Denies visual and auditory hallucinations at this time    Problem: Daily Care  Goal: Daily care needs are met  Description: Assess and monitor ability to perform self care and identify potential discharge needs.  Outcome: Progressing     Problem: Tobacco/Nicotine Abuse  Goal: Risk control - tobacco abuse  Description: Actions to eliminate or reduce tobacco use.  Outcome: Progressing

## 2021-11-27 DIAGNOSIS — T1491XA Suicide attempt, initial encounter: Secondary | ICD-10-CM | POA: Diagnosis present

## 2021-11-27 MED ORDER — LORAZEPAM 1 MG PO TABS
1.0000 mg | ORAL_TABLET | Freq: Two times a day (BID) | ORAL | Status: DC | PRN
Start: 2021-11-27 — End: 2021-12-02
  Administered 2021-11-28 – 2021-12-02 (×6): 1 mg via ORAL
  Filled 2021-11-27 (×6): qty 1

## 2021-11-27 MED ORDER — QUETIAPINE FUMARATE 50 MG PO TABS
50.0000 mg | ORAL_TABLET | Freq: Four times a day (QID) | ORAL | Status: DC | PRN
Start: 2021-11-27 — End: 2021-11-28
  Administered 2021-11-28: 50 mg via ORAL
  Filled 2021-11-27: qty 1

## 2021-11-27 MED ORDER — LORAZEPAM 1 MG PO TABS
1.0000 mg | ORAL_TABLET | Freq: Two times a day (BID) | ORAL | Status: DC | PRN
Start: 2021-11-27 — End: 2021-11-27

## 2021-11-27 MED ORDER — TORSEMIDE 20 MG PO TABS
20.0000 mg | ORAL_TABLET | Freq: Every day | ORAL | Status: DC
Start: 2021-11-27 — End: 2021-11-29
  Administered 2021-11-27 – 2021-11-29 (×3): 20 mg via ORAL
  Filled 2021-11-27 (×3): qty 1

## 2021-11-27 MED ORDER — HYDROXYZINE HCL 25 MG PO TABS
25.0000 mg | ORAL_TABLET | Freq: Three times a day (TID) | ORAL | Status: DC | PRN
Start: 2021-11-27 — End: 2021-11-27
  Administered 2021-11-27: 25 mg via ORAL
  Filled 2021-11-27: qty 1

## 2021-11-27 MED ORDER — ARIPIPRAZOLE 15 MG PO TABS
15.0000 mg | ORAL_TABLET | Freq: Every day | ORAL | Status: DC
Start: 2021-11-27 — End: 2021-11-28
  Administered 2021-11-27 – 2021-11-28 (×2): 15 mg via ORAL
  Filled 2021-11-27 (×2): qty 1

## 2021-11-27 MED ORDER — QUETIAPINE FUMARATE 50 MG PO TABS
50.0000 mg | ORAL_TABLET | Freq: Four times a day (QID) | ORAL | Status: DC | PRN
Start: 2021-11-27 — End: 2021-11-27

## 2021-11-27 NOTE — Plan of Care (Signed)
Pt a/ox3. Vss. Afebrile. Denies sob or chest pain. Dim lung sounds, 93% on RA. Abd soft non tender, +bowel sounds. Tolerating po meds, denies n/v.     Pt remains on 1:1 watch, this morning was given prn atarax for anxiety with little effect. Pt then became upset with morning sitter and threw ginger ale. Sitter was switched out and pt was given prn Seroquel. MD to bedside. At this time pt is calm and cooperative. Plan of care continues.      Problem: Daily Care  Goal: Daily care needs are met  Description: Assess and monitor ability to perform self care and identify potential discharge needs.  Outcome: Progressing
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
## 2021-11-27 NOTE — Plan of Care (Signed)
Pt A&Ox4 in bed. Appears calm and cooperative. VS stable on RA. Remains afebrile. Dyspnea on exertion noted. BS present. Denies n/v. Pain 10/10. See MAR. Denies visual & auditory hallucinations at this time. Pt remains on 1:1 watch.    Problem: Daily Care  Goal: Daily care needs are met  Description: Assess and monitor ability to perform self care and identify potential discharge needs.  Outcome: Progressing     Problem: Tobacco/Nicotine Abuse  Goal: Risk control - tobacco abuse  Description: Actions to eliminate or reduce tobacco use.  Outcome: Progressing

## 2021-11-27 NOTE — Case Mgmt Note (Signed)
Pt discussed in MDR. CM unable to meet w/ pt as she has been agitated towards staff. Will attempt to meet w/ pt at a later time. Chart review done. Pt is a 64 YOF, admitted w/ SI, hallucinations, hypoxia. New O2 requirement. Pt has hx PTSD, PSUD, bipolar, schizoaffective DO, COPD, CHF. Unclear if pt resides in a [facility] or is homeless. Independent w/ RW. Antic DC to inpatient psych when medically cleared. Will cont to follow.

## 2021-11-27 NOTE — Progress Notes (Signed)
PROGRESS NOTE  11/27/2021    PATIENT INFO: Cheryl Zimmerman, [age] year old female  DATE OF ADMISSION: 11/26/21    ROOM/BED LOCATION:  606/606-A  HOSPITAL DAY: 0    REVIEW OF OVERNIGHT EVENTS:  Patient admitted from the ED yesterday evening. No acute events overnight.     SUBJECTIVE:  Subjectively this morning the patient is doing "very badly." She expresses significant emotional pain, says she has racing thoughts, deep sadness, is worried about where she will live after this, doesn't know how to manage the intense emotions she is having. Shares traumatic experiences of her past that make her scared people will hurt her.     When asked about her breathing, she reports it is difficult but the same as usual. Feels "heavy." Denies any acute chest pain, wheezing, cough. Denies any additional symptoms.    OBJECTIVE:    VITAL SIGNS    11/26/21  2131 11/26/21  2132 11/27/21  0151 11/27/21  0520   BP: 108/75 108/75 104/74 124/83   Pulse: 73 73 72 83   Resp: 18 20 20 18    Temp: 98.7 F (37.1 C)  98.6 F (37 C) 97.6 F (36.4 C)   TempSrc: Oral  Oral Oral   SpO2: 91%  93% 91%   Weight:           INS/OUTS (PAST 24 HOURS)  I/O 24 Hrs:  In: 480 [P.O.:480]  Out: -     PHYSICAL EXAM   Gen: Sitting up in bed, no acute distress.  HEENT: PERRLA, MMM.  CV: RRR. Normal S1, S2. No m/r/g. JVP not visible due to habitus.  Lungs: Clear to auscultation bilaterally, no wheezes, crackles.  Abd: NABS. Soft, NTND.  Ext: Warm. No edema.   Neuro: A&Ox4. CN 2-12 grossly intact. Moving all limbs spontaneously.  Skin: No rashes.  T/L/D: Left peripheral IV.     RECENT LABS  BMP:   Recent Labs     11/24/21  1435 11/26/21  1251   NA 138 143   K 3.7 3.8   CL 97* 99   CO2 32 33*   BUN 14 22*   CREAT 0.7 0.7   GLUCOSER 132 126     Ca, Mg, Phos:   Recent Labs     11/24/21  1435 11/26/21  1251   CA 9.1 9.7     CBC:   Recent Labs     11/26/21  1251   WBC 10.0   HCT 47.7*   HGB 14.8   PLTA 308   RBC 4.85     Coagulation Labs: No results for  input(s): PT, INR in the last 72 hours.    Invalid input(s): PTT  LFTs: No results for input(s): AST, ALT, TBILI, DBILI, ALKPHOS in the last 72 hours.  Pancreatic Enzymes: No results for input(s): AMY, LIP in the last 72 hours.  Urinalysis:No results for input(s): UACOL, UACLA, UAGLU, UABIL, UAKET, SPEGRAVURINE, UAOCC, UAPH, UAPRO, UANIT, LEUKOCYTES in the last 72 hours.    Invalid input(s): UAOBU  Troponin:  TROPONIN T HS BASELINE (ng/L)   Date Value   11/24/2021 14 (H)     TROPONIN T HS 1 HOUR RESULT (ng/L)   Date Value   11/24/2021 14 (H)     TROPONIN T HS 3 HOUR RESULT (ng/L)   Date Value   11/24/2021 13 (H)     DELTA 1 HOUR TROPONIN T HS (ng/L)   Date Value   11/24/2021 0  DELTA 3 HOUR TROPONIN T HS (ng/L)   Date Value   11/24/2021 1       MICROBIOLOGY REVIEW  No new microbiology results to review.    IMAGING AND OTHER STUDIES  No new imaging or other studies to review.    MEDICATIONS  See list in [place]     Patient is a 51 year-old female with history of housing instability, bipolar disorder, schizoaffective disorder, polysubstance use, PTSD, multiple suicide attempts, history of systolic congestive heart failure, seizure disorder on keppra, complex pain syndrome, COPD, bronchial asthma, and obesity, presenting with suicidal ideation, with difficulty breathing which appears to be baseline for her, now needing inpatient level of psychiatric care, awaiting bed placement.    # COPD  # Difficulty breathing  Patient has COPD and has difficulty breathing on exertion at baseline. She smokes ~3 packs/daily. She has given some differing reports of her home oxygen use, but it seems most likely that she is not usually on home oxygen. She says "people want me to be" when asked if she is on home oxygen, but also has housing instability and says she has not used oxygen at home recently. She denies cough or wheeze, has clear lungs on exam, and dyspnea is limited to exertion and resolves quickly. Oxygen  saturation 91-93 on 1 L of NC when examined upon admission. Unlikely to be COPD exacerbation given no change in cough or sputum, no wheeze, patient is able to stand and breathe without oxygen while being examined. Difficulty breathing is also not likely to be CHF exacerbation, as the patient is euvolemic on exam, was not improved with lasix, is still getting daily torsemide, denies orthopnea. Most likely cause of difficulty breathing is chronic hypoventilation, supported by metabolic alkalosis, likely in setting of chronic respiratory acidosis, improvement with oxygen administration, with some additional component of functional limitation/deconditioning, as patient says she has had limited mobility due to pain.     Patient now off oxygen and doing well. This is likely her baseline and she is fine to be maintained without supplemental O2.     - Keep off oxygen   - Continue home symbicort BID  - Update psych team that patient is medically cleared  - Albuterol prn    # Suicidal Ideation, Risk  Patient with active SI, seen by psychiatry in emergency room, determined to be high risk. Active SI.   - Section 12 criteria met  - SI precautions, 1:1 sitter   - Formally re-consult psych now that she's inpatient, share that she is medically cleared and can be placed somewhere without O2 requirement    # Chronic Pain  # Bipolar Disorder   Patient with bipolar disorder and chronic pain syndrome, has significant pain at baseline. She is very concerned with the pain and is "begging" for pain medication to help. She says pain is worsening her depression. Knows she takes some home medications for pain but does not know which. Reports significant depression at this time, interrelated to pain.   - Continue home gabapentin 600 mg  - Continue duloxetine 40 mg daily  - Continue doxepin 10 mg nightly  - Continue haldol 5 mg TID, consider scaling back after collateral with group home  - Use ativan prn for agitation   - Continue to  engagement in empathetic listening and pursue non-pharmacologic methods of support like enabling patient to shower or brush her hair which she reports is calming to her  -  Consider adding 300 mg seroquel qhs as patient says this has been helpful in the past, may help reduce anxiety and agitation while awaiting a bed    # Substance Use  Urine drug screen positive for cocaine, fentanyl upon presentation. Patient also on suboxone. Denies opioid use. Endorses marijuana, alcohol, cocaine use. History heavy alcohol use, but less active now per patient report. Low concern for withdrawal developing at this point given she's been asymptomatic on current medication regimen while in ED for 3-4 days.  - Continue suboxone 8-2 mg three times daily  - Continue acamprosate three times daily    # Hx of HFpEF  Patient with history of CHF. Unlikely to be in acute exacerbation of CHF given looks euvolemic on exam (exam slightly difficult given habitus but legs without edema, lungs sound clear), denies orthopnea.   - Start torsemide 20 mg, continue to monitor     # Gastritis  Patient has a history of gastritis. Denies any current abdominal pain, reflux symptoms.  - Continue home protonix 40 mg daily    # History of Seizures  Patient with remote history of seizures.   - Continue home oxcarbazapine 150 twice daily  - Continue home levetiracetam 750 mg twice daily    # History of Hypothyroidism  Patient with history of hypothyroidism. Controlled on home levo.  - Continue levothyroxine 25 mcg every morning    # Constipation  - Continue home colace 100 mg daily prn    # Cardiac Risk factors  - Continue home atorvastatin 80 mg  - Continue home aspirin 81 mg    # Tobacco Use  Patient reports smoking up to 3 packs/day. Using nicotine gum and nicotine patch to good effect  - Continue nicotine patch  - Continue nicotine gum     # Allergies  - Continue home Flonase  - Continue home loratadine    # Polypharmacy  Patient on many  medications and with many hospitalizations and discharges to different group homes/living facilities/unstable housing over several years. Important to assess necessity of each medication.   - Call group home for med rec    Diet: full     DVT Prophylaxis: subcutaneous heparin     Medication Reconciliation: Done with pharmacies, ED got med rec from group home  Was medication list from nursing facility collected & reviewed? n/a    Changes from home medication regimen: as above.    Code Status: Full Code    Health care proxy: Data Unavailable  Data Unavailable    Dispo: Inpatient Psych Facility once bed is available    Gearldine Bienenstock, [facility]   [phone]

## 2021-11-27 NOTE — Discharge Summary (Deleted)
Physician Discharge Summary     Patient ID:  Name Cheryl Zimmerman  MRN [id number]   Age 51 year old  DOB Oct 10, 1970    Admit Date: 11/26/21    Discharge Date: 11/27/21  Admitting Physician: Enos Fling, MD  Discharge Physicians   Intern: Gearldine Bienenstock, [place] MS3   Resident: Gwenlyn Perking, MD   Attending: Jacelyn Pi, MD     Discharge Diagnoses: Suicidal Ideation    Issues for Follow up:     [ ]  Suicidal Ideation  Patient with active SI inpatient.     [ ]  Polypharmacy  Patient with a complicated medication list. She has had many different hospitalizations at different places, discharged to various group homes. Has been using multiple different pharmacies, likely related to housing instability, no consistent providers. Important to evaluate the necessity of the medications, particularly psychiatric medications.    [ ]  Housing Instability  Patient with many years of housing instability. Most recently lived in a group home, was living in a respite house prior, and prior to that was living on the streets in [place]. She is understandably very upset and anxious about this situation, and does not feel safe being discharged back to the same group home where she was before.     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 She was seen in the ED by psychiatry and was deemed to require inpatient psychiatry hospitalization.    While boarding in the ED,  on 3/7, the patient reported "having a hard time breathing." At that time, she had an oxygen saturation 88-90 on room air. She was given a duo-neb and a spot dose of lasix. She was then put on 4L NC and has been on supplemental O2, now weaned to 2L, since.     She was having active visual hallucinations, reports seeing her dead son while in the ED. Reported to psychiatry that she is thinking actively of ways to take her own life while inpatient in the hospital. Denies HI.    Medicine bed was requested given an inpatient psychiatry bed will likely not be available for several days, and the patient is not safe to discharge in the meantime.    However, upon further evaluation, question was raised about whether or not the patient required continuous oxygen, as this was posing an issue both for psychiatric bed placement and for the patient's safety, as she has thought about and tried to strangle herself with the oxygen tubing.    Thus, she is being admitted for further investigation of her oxygen needs while awaiting psychiatric placement.     Hospital Course:    While in the inpatient medicine floor, the patient was trialed off of the supplemental oxygen and was found to have oxygen saturation of 88%+, which is a normal range for patients with longstanding COPD, significant smoking history, and chronic hypoventilation. She did not report any worsening difficulty breathing, wheeze, dyspnea, dizziness, cough or chest pain while off the oxygen. She reported that she continued  to feel at her baseline.     # COPD  # Difficulty breathing  Patient has COPD and has difficulty breathing on exertion at baseline. She smokes ~3 packs/daily. She has given some differing reports of her home oxygen use, but it seems most likely that she is not usually on home oxygen. She says "people want me to be" when asked if she is on home oxygen, but also has housing instability and says she has not used oxygen at home recently. She denies  cough or wheeze, has clear lungs on exam, and dyspnea is limited to exertion and resolves quickly. Oxygen saturation 91-93 on 1 L of NC when examined upon admission. Unlikely to be COPD exacerbation given no change in cough or sputum, no wheeze, patient is able to stand and breathe without oxygen while being examined. Difficulty breathing is also not likely to be CHF exacerbation, as the patient is euvolemic on exam, was not improved with lasix, is still getting daily torsemide, denies orthopnea. Most likely cause of difficulty breathing is chronic hypoventilation, supported by metabolic alkalosis, likely in setting of chronic respiratory acidosis, improvement with oxygen administration, with some additional component of functional limitation/deconditioning, as patient says she has had limited mobility due to pain.    We were able to wean the patient off oxygen. She did not report any increased dyspnea, chest pain, wheezing, or cough. She reported that the difficulty breathing she was having has been going on for a very long time, but was not acutely worse.    She was maintained on her home symbicort twice daily.    # Suicidal Ideation, Risk  Patient deemed to be high risk given active suicidal ideation, attempt to harm herself via strangling with the oxygen monitors while in the ED. She was seen by psychiatry. Met section 12 criteria. She was maintained with a 1:1 sitter with strict SI precautions while inpatient. Psych saw her while inpatient and determined she continued to need an inpatient level of psychiatry care and continued to meet section 12 criteria, with requirement for 1:1 sitter.    # Chronic Pain  # Bipolar Disorder  Patient with bipolar disorder and chronic pain syndrome, has significant pain at baseline. She is very concerned with the pain and is "begging" for pain medication to help. She says pain is worsening her depression. Knows she takes some home medications for pain but does not know which.  Reports significant depression at this time, interrelated to pain. We continued her home gabapentin 600 mg; duloxetine 40 mg daily; doxepin 10 mg nightly; haldol 5 mg TID. We used ativan 1 mg as needed for significant anxiety and agitation. Patient also responded well to verbal redirection.    # Substance Use  Urine drug screen was positive for cocaine, fentanyl upon presentation. Patient also on suboxone. Denies opioid use. Endorses marijuana, alcohol, cocaine use. There was low concern for withdrawal upon admission to the inpatient floor as she was asymptomatic from a withdrawal perspective on current medication regimen while in ED for 3-4 days. We ontinued suboxone 8-2 mg three times daily; acamprosate three times daily.    # Hx of HFpEF  Patient with history of CHF. Unlikely to be in acute exacerbation of CHF given looks euvolemic on exam (exam slightly difficult given habitus but legs without edema, lungs sound clear), denies orthopnea. We decreased her torsemide to 20 mg daily given BUN 22, creatinine 0.7, XLK:GMWNUUVOZD >20:1, likely volume down if anything upon admission.     #  Gastritis  Patient has a history of gastritis. Denies any current abdominal pain, reflux symptoms. We continued home protonix 40 mg daily.    # History of Seizures  Patient with remote history of seizures. We continued home oxcarbazapine 150 twice daily; home levetiracetam 750 mg twice daily.    # History of Hypothyroidism  Patient with history of hypothyroidism. Controlled on home levothyroxine 25 mcg, which we continued.    # Constipation  Has some chronic issues with constipation. Denies any constipation at this time. We continued home colace 100 mg daily as needed.    # Cardiac Risk factors  Given significant cardiovascular risk factors (smoking, hyperlipidemia, obesity) we continued home atorvastatin 80 mg; home aspirin 81 mg.    # Tobacco Use  Patient reports smoking up to 3 packs/day. Used nicotine gum and nicotine  patch to good effect while inpatient.    # Allergies  Patient with some baseline allergies. We continued home Flonase; home loratadine.    # Polypharmacy  Patient on many medications and with many hospitalizations and discharges to different group homes/living facilities/unstable housing over several years. We did our best to reconcile her medication list. ED provider was able to get the med list from the group home, primary team was unable to contact them.    Key labs, imaging, other tests:     Chemistries:  Recent Labs     11/24/21  1435 11/26/21  1251   NA 138 143   K 3.7 3.8   CO2 32 33*   BUN 14 22*   CREAT 0.7 0.7   CA 9.1 9.7   ANION 9* 11   GFR > 60 > 60    GI:  No results for input(s): AST, ALT, TBILI, DBILI, IBIL, ALKPHOS, GGTP, ALBUMIN, LIP in the last 72 hours.   CBC:  Recent Labs     11/26/21  1251   WBC 10.0   HGB 14.8   HCT 47.7*   PLTA 308    Coags:   No results for input(s): INR, PT, APTT, HPTT, FIB in the last 72 hours.   Trops, BNP, D-dimer, Lactate, C-RP, Procal, CK:  Recent Labs     11/24/21  1435 11/24/21  1550 11/24/21  1608 11/24/21  1757 11/26/21  1251   TROPTHS 14* 14*  --  13*  --    TROPTHSDELTA  --  0  --  1  --    PROBNP < 36  --   --   --  < 36   DDPEDVT  --   --  498  --   --        Finger Sticks:  No results for input(s): FINGERSTICKR in the last 72 hours. Urinalysis:  No results for input(s): UACOL, UACLA, UAGLU, UABIL, UAKET, SPEGRAVURINE, UAOCC, UAPH, UAPRO, UABAC, UARBC, UANIT, LEUKOCYTES, UABAC, UASQE in the last 72 hours.    Invalid input(s): Elonda Husky     XR Chest Portable    Result Date: 11/24/2021    TECHNIQUE: Portable chest, 2:52 PM and repeat at 2:54 PM INDICATION: Hypoxia COMPARISON: 05/31/2011 FINDINGS: Quality: Satisfactory.   Tubes/lines: None. Lungs: There is a linear density consistent with atelectasis in the left base. There is overlying breast tissue obscuring a portion of the right base. No definite infiltrates are seen. Pleura: There is no pleural  effusion or pneumothorax. Heart: The cardiac silhouette is unremarkable. Mediastinum/hila: Unremarkable. Bones and Soft Tissues: There are degenerative changes and scoliosis of  the thoracic spine.       IMPRESSION: Bibasilar atelectasis, otherwise no acute cardiopulmonary findings.       Reviewed and Electronically Signed by: Penni Homans MD Signed Date/Time: 11-24-2021 15:11:59         Pending Laboratory Work and Tests: n/a    Discharge Exam:  BP 127/82    Pulse 84    Temp 97.8 F (36.6 C) (Oral)    Resp 18    Wt (!) 145.2 kg (320 lb)    SpO2 90%     WUX:LKGMWNU up in bed, no acute distress.  HEENT: PERRLA, MMM.  CV: RRR. Normal S1, S2. No m/r/g.JVP not visible due to habitus.  Lungs:Clear to auscultation bilaterally, no wheezes, crackles.  Abd: NABS. Soft, NTND.  Ext: Warm. No edema.   Neuro: A&Ox4. CN 2-12 grossly intact. Moving all limbs spontaneously.  Skin: No rashes.  T/L/D:Left peripheral IV.     Discharge Medication List:      Current Facility-Administered Medications:     torsemide (DEMADEX) tablet 20 mg, 20 mg, Oral, Daily, Enrique Sack, MD    ARIPiprazole (ABILIFY) tablet 15 mg, 15 mg, Oral, Daily, Enrique Sack, MD    QUEtiapine (SEROquel) tablet 50 mg, 50 mg, Oral, Q6H PRN, Enrique Sack, MD    LORazepam (ATIVAN) tablet 1 mg, 1 mg, Oral, BID PRN, Enrique Sack, MD    heparin (porcine) injection 5,000 Units, 5,000 Units, Subcutaneous, Q12H [place], Enrique Sack, MD    diclofenac (VOLTAREN) 1 % gel 4 g, 4 g, Topical, 4x Daily PRN, Enrique Sack, MD    sennosides (SENOKOT) tablet 17.2 mg, 17.2 mg, Oral, Nightly PRN, Enrique Sack, MD    nicotine polacrilex (NICORETTE) gum 2 mg, 2 mg, Oral, Q1H PRN, Konrad Saha, MD, 2 mg at 11/27/21 1221    nicotine (NICODERM CQ) 21 MG/24HR 1 patch, 1 patch, Transdermal, Daily, Edison Simon, MD, 1 patch at 11/27/21 0837    doxepin (SINEquan) capsule 10 mg, 10 mg, Oral, Nightly, Edison Simon, MD, 10 mg at  11/26/21 2132    DULoxetine (CYMBALTA) DR capsule 40 mg, 40 mg, Oral, Daily, Edison Simon, MD, 40 mg at 11/27/21 2725    haloperidol (HALDOL) tablet 5 mg, 5 mg, Oral, TID, Edison Simon, MD, 5 mg at 11/27/21 1221    melatonin tablet 3 mg, 3 mg, Oral, Nightly, Edison Simon, MD, 3 mg at 11/26/21 2132    methocarbamol (ROBAXIN) tablet 750 mg, 750 mg, Oral, TID, Edison Simon, MD, 750 mg at 11/27/21 1221    aspirin chewable tablet 81 mg, 81 mg, Oral, Daily, Edison Simon, MD, 81 mg at 11/27/21 0837    loratadine (CLARITIN) tablet 10 mg, 10 mg, Oral, Daily, Edison Simon, MD, 10 mg at 11/27/21 0837    fluticasone (FLONASE) 50 MCG/ACT nasal spray 1 spray, 1 spray, Each Nostril, Daily, Edison Simon, MD, 1 spray at 11/26/21 1005    levETIRAcetam (KEPPRA) tablet 750 mg, 750 mg, Oral, BID, Edison Simon, MD, 750 mg at 11/27/21 0837    atorvastatin (LIPITOR) tablet 80 mg, 80 mg, Oral, Nightly, Edison Simon, MD, 80 mg at 11/26/21 2132    gabapentin (NEURONTIN) capsule 600 mg, 600 mg, Oral, TID, Edison Simon, MD, 600 mg at 11/27/21 1221    pantoprazole (PROTONIX) EC tablet 40 mg, 40 mg, Oral, Daily, Edison Simon, MD, 40 mg at 11/27/21 0837    buprenorphine-naloxone (SUBOXONE)  8-2 MG SL tablet 1 tablet, 8 mg, Sublingual, TID, Edison Simon, MD, 1 tablet at 11/27/21 1221    budesonide-formoterol (SYMBICORT) 160-4.5 MCG/ACT inhaler 2 puff, 2 puff, Inhalation, 2 times daily, Edison Simon, MD, 2 puff at 11/27/21 1138    levothyroxine (SYNTHROID) tablet 25 mcg, 25 mcg, Oral, DAILY, Edison Simon, MD, 25 mcg at 11/27/21 0515    OXcarbazepine (TRILEPTAL) tablet 150 mg, 150 mg, Oral, BID, Edison Simon, MD, 150 mg at 11/27/21 4765    docusate sodium (COLACE) capsule 100 mg, 100 mg, Oral, Daily PRN, Edison Simon, MD    ibuprofen (ADVIL) tablet 400 mg, 400 mg, Oral, Q6H PRN, Edison Simon, MD, 400 mg at 11/27/21 4650    ferrous sulfate EC tablet 324 mg, 324 mg, Oral, Q48H, Edison Simon, MD, 324 mg at 11/27/21 1236    acamprosate (CAMPRAL) tablet 333 mg, 333 mg, Oral, TID, Edison Simon, MD, 333 mg at 11/27/21 1221    albuterol HFA inhaler 2 puff, 2 puff, Inhalation, Q6H PRN, Edison Simon, MD, 2 puff at 11/25/21 2139    Code Status at Admission: Full Code  Code Status at Discharge: [place] Proxy and Phone Number: Data Unavailable  Data Unavailable   Diet: Regular  Wound Care: N/a  Discharge Destination: Psychiatric Facility     Future appointments:   Current and Future Appointments at [facility] ([facility]) 11/27/2021 - 02/25/2022    None        Follow-up Information    None         To reach medical records at any time call the [facility] operator at [phone]     To reach the covering hospitalist, page [phone]  To reach the [facility] for pending results, call [phone]    Signed:  Gearldine Bienenstock, 11/27/2021     Discussed with Resident Gwenlyn Perking, MD, and attending, Dr. Jacelyn Pi, MD.

## 2021-11-28 DIAGNOSIS — F1990 Other psychoactive substance use, unspecified, uncomplicated: Secondary | ICD-10-CM

## 2021-11-28 DIAGNOSIS — M79643 Pain in unspecified hand: Secondary | ICD-10-CM

## 2021-11-28 DIAGNOSIS — K5900 Constipation, unspecified: Secondary | ICD-10-CM

## 2021-11-28 DIAGNOSIS — J449 Chronic obstructive pulmonary disease, unspecified: Secondary | ICD-10-CM

## 2021-11-28 DIAGNOSIS — G40909 Epilepsy, unspecified, not intractable, without status epilepticus: Secondary | ICD-10-CM

## 2021-11-28 DIAGNOSIS — I5030 Unspecified diastolic (congestive) heart failure: Secondary | ICD-10-CM

## 2021-11-28 DIAGNOSIS — F319 Bipolar disorder, unspecified: Secondary | ICD-10-CM

## 2021-11-28 DIAGNOSIS — E039 Hypothyroidism, unspecified: Secondary | ICD-10-CM

## 2021-11-28 DIAGNOSIS — G2402 Drug induced acute dystonia: Secondary | ICD-10-CM | POA: Diagnosis not present

## 2021-11-28 DIAGNOSIS — R0689 Other abnormalities of breathing: Secondary | ICD-10-CM | POA: Diagnosis not present

## 2021-11-28 DIAGNOSIS — K2970 Gastritis, unspecified, without bleeding: Secondary | ICD-10-CM

## 2021-11-28 DIAGNOSIS — G8929 Other chronic pain: Secondary | ICD-10-CM

## 2021-11-28 MED ORDER — DIPHENHYDRAMINE HCL 50 MG/ML IJ SOLN
50.00 mg | Freq: Once | INTRAMUSCULAR | Status: AC
Start: 2021-11-28 — End: 2021-11-28
  Administered 2021-11-28: 50 mg via INTRAVENOUS
  Filled 2021-11-28: qty 1

## 2021-11-28 MED ORDER — QUETIAPINE FUMARATE 50 MG PO TABS
50.0000 mg | ORAL_TABLET | Freq: Four times a day (QID) | ORAL | Status: DC | PRN
Start: 2021-11-28 — End: 2021-12-01
  Administered 2021-11-28 – 2021-11-30 (×5): 50 mg via ORAL
  Filled 2021-11-28 (×5): qty 1

## 2021-11-28 MED ORDER — HALOPERIDOL 5 MG PO TABS
5.0000 mg | ORAL_TABLET | Freq: Three times a day (TID) | ORAL | Status: DC
Start: 2021-11-28 — End: 2021-12-02
  Administered 2021-11-28 – 2021-12-02 (×13): 5 mg via ORAL
  Filled 2021-11-28 (×13): qty 1

## 2021-11-28 NOTE — Consults (Addendum)
[facility] Psychiatry Service Follow-up Note    Prior psychiatry consult notes as well as interim history reviewed in EPIC.   No acute overnight events, remains on one-to-one.  Adherent to medications.  Paged by medicine about concern for acute involuntary movements of left hand, for which Abilify was discontinued patient was given 50 mg IV Benadryl and 1 mg p.o. Ativan.  On interview, patient reports that this morning she had pain starting from her left wrist up her arm, swelling, and shaking of her arm that lasted for a few hours.  She now feels better.  She reports that this has been going on for at least a year, about a couple times within that frame.  It usually occurs with swelling.  On examination, patient had tremors of both hands.  Full range of motion of both fingers and wrists.  No rigidity bilaterally.  Fine motor movement was reduced in the left hand as opposed to the right.  During evaluation, patient had a repeat of what happened this morning for her report.  Her hand had a tremor and then went limp for about a second, but patient was able to move it when I requested that she do so.  No myoclonus or rigidity seen.  Tremor appeared similar to the right hand during " episode".  Patient reports that she does not like Abilify and has not been taking it.  Chart review reveals similar instances of limb shaking dating back to at least 2012.  Does have a history of seizure on Keppra.  Psychiatrically, the patient reports continuing to feel very badly.  Endorses active suicidal ideation, and is angry that we have stopped her from completing suicide.  BP (!) 162/97    Pulse 85    Temp 97.9 F (36.6 C) (Oral)    Resp 22    Wt (!) 145.2 kg (320 lb)    SpO2 93%     Standing medications:    haloperidol  5 mg Oral TID    torsemide  20 mg Oral Daily    heparin (porcine)  5,000 Units Subcutaneous Q12H [facility]    nicotine  1 patch Transdermal Daily    doxepin  10 mg Oral Nightly    DULoxetine  40 mg Oral Daily    melatonin   3 mg Oral Nightly    methocarbamol  750 mg Oral TID    aspirin  81 mg Oral Daily    loratadine  10 mg Oral Daily    fluticasone  1 spray Each Nostril Daily    levETIRAcetam  750 mg Oral BID    atorvastatin  80 mg Oral Nightly    gabapentin  600 mg Oral TID    pantoprazole  40 mg Oral Daily    buprenorphine-naloxone  8 mg Sublingual TID    budesonide-formoterol  2 puff Inhalation 2 times daily    levothyroxine  25 mcg Oral DAILY    OXcarbazepine  150 mg Oral BID    ferrous sulfate  324 mg Oral Q48H    acamprosate  333 mg Oral TID       PRN Medications:   Current Facility-Administered Medications   Medication Dose Route Frequency Last Admin    QUEtiapine  50 mg Oral Q6H PRN      LORazepam  1 mg Oral BID PRN 1 mg at 11/28/21 1008    diclofenac  4 g Topical 4x Daily PRN      sennosides  17.2 mg Oral Nightly PRN  nicotine polacrilex  2 mg Oral Q1H PRN 2 mg at 11/28/21 0854    docusate sodium  100 mg Oral Daily PRN      ibuprofen  400 mg Oral Q6H PRN 400 mg at 11/27/21 0219    albuterol HFA  2 puff Inhalation Q6H PRN 2 puff at 11/25/21 2139        Mental Status Exam:  Mental Status Exam   General Appearance: Dressed in hospital gown/johnny;Unkempt  Behavior: Cooperative;Good eye contact  Level of Consciousness: Alert  Orientation Level: Grossly Intact  Attention/Concentration: WNL  Mannerisms/Movements: Tremor  Speech Quality and Rate: WNL  Speech Clarity: Clear  Speech Tone: Normal vocal inflection  Vocabulary/Fund of Knowledge: WNL  Memory: Grossly intact  Thought Process & Associations: Goal-directed;Linear  Dissociative Symptoms: None  Thought Content: No abnormalities reported or observed  Delusions: None  Hallucinations: None  Suicidal Thoughts: Active thoughts;Plans;Intent  Homicidal Thoughts: None  Mood: Anxious;Depressed/Sad  * Mood Comment: "not good"  Affect: Depressed;Anxious;Congruent w/mood  Judgment: Poor  Insight: Poor    Impression:  Cheryl Zimmerman is a 51 year old  English-speaking woman with longstanding psych history, including chart diagnoses of PTSD, SCAD, BPAD, and PSUD, multiple hospitalizations starting in childhood, on antipsychotic medication since age 52, multiple prior suicide attempts and SIB requiring medical attention, severe head trauma and seizure d/o, and complex medical illness, who lives in a group home, presenting to ED with increasing suicidal ideation, hallucinations, and trauma reexperiencing symptoms, admitted to medicine for evaluation of need for supplemental oxygen after attempting to strangle self with oxygen tubing in ED.    Diagnostic uncertainty given overlap in chart diagnoses.  Patient identifies her most concerning symptoms are those associated with PTSD, namely nightmares, intrusive memories.  Visual hallucinations may be decompensation of SCAD, trauma-related (e.g. flashbacks), major depressive episode with psychotic features, or transient psychosis in the setting of stress as seen in some personality disorders.  Memory deficits on exam may be due to severe history of head injury, polypharmacy and substance use, or vitamin deficiency.    In terms of risk, patient endorses SI with intent and method (states she will jump in front of a train at the nearest station as soon as she is discharged) and believes that ending her life will reunite her and have been with her deceased son.  Her trauma-reexperiencing and psychotic symptoms related to her son are heightening her risk of suicide.  Additional risk factors include history of multiple attempts, recent SIB requiring medical attention, history of head trauma, comorbid substance use, preparatory behaviors/attempt in ED, impulsivity, perceived lack of social support, and chronic pain.  There are a few mitigating risk factors and patient is not agreeable to safety planning.  For this reason, patient meets criterion 1 of section 12 and inpatient psychiatric care is necessary to optimize  medications, provide diagnostic clarification, and mitigate risk of harm to self.    DSM-V    Primary Diagnosis: PTSD (post-traumatic stress disorder)   Diagnosis: Schizoaffective disorder, bipolar type ([name])   Psychosocial and Contextual Factors: Housing problems     3/12: Patient remains with active SI with intent.  Unclear what patient's acute involuntary movements represent, but does not appear to be dystonic reaction.  Would not start benztropine or Benadryl at this time.  Okay to continue psychiatric medications.  Since patient is not taking Abilify at home, can discontinue that now.  Would be good to reach out to provider tomorrow to collaborate on plan moving forward.  Patient may have some tardive dyskinesia related to long-term antipsychotic use.  Remains on section.    Plan:   Agree with plan by Jaquita Folds, MD with modification    -Patient meets section 12 criteria and may not leave AMA  -Bed search pending, likely geri due to medical complexity and use of rolling walker  -Please obtain medication list from group home and continue additional psychiatric medications; CL will make revisions as necessary once obtained.  -Encourage pt to take quetiapine 50mg  q6h prn for anxiety before lorazepam 1mg  bid  -Ok for pt to have pen, hairbrush, plastic spoon, and other non-sharp objects under supervision. Please remove once pt is done with the objects.  -Patient should remain on 1:1 observation for continued suicidal intent.  -Consider obtaining thiamine, B12 levels given extensive alcohol use history and memory deficits.  -Okay to discontinue Abilify at this time as patient not taking at home    Discussed case with [facility] , MD    Thank you for this consult.  CL will continue to follow.  For questions over the weekend please page on-call psychiatry resident at [phone].    Ralene Bathe MD  PGY-3 Psychiatry  Pager [phone]    This note was created using voice-recognition software. Please excuse any  typographical errors that have not been edited out.

## 2021-11-28 NOTE — Plan of Care (Signed)
Pt A&Ox4 in bed. Calm and cooperative at this time. Denies visual/auditory hallucinations. VS stable on RA. Remains afebrile. Dyspnea on exertion noted. Ambulating to bathroom with rolling walker. Denies n/v. Pain 10/10. See MAR. Able to make needs known. Remains on 1:1 observation.  Problem: Daily Care  Goal: Daily care needs are met  Description: Assess and monitor ability to perform self care and identify potential discharge needs.  Outcome: Progressing     Problem: Tobacco/Nicotine Abuse  Goal: Risk control - tobacco abuse  Description: Actions to eliminate or reduce tobacco use.  Outcome: Progressing

## 2021-11-28 NOTE — Case Mgmt Note (Signed)
Pt discussed in MDR. MD has cleared pt for inpatient psych. Variance day #1. AND Days. Transfer to inpatient psych when bed is available. Will cont to follow.

## 2021-11-28 NOTE — RN Shift Note (Signed)
50 YOF FULLCODE admit day 2 for hypoxia with PMHX of housing instability,bipolar disorder, schizoaffective disorder, polysubstance use,PTSD, multiple suicide attempts,history ofsystolic congestive heart failure, seizure disorder on keppra, complex pain syndrome, COPD, bronchial asthma, and obesity. Patient on RA with goal of 88-92%. RN to RN report received. Patient awake and alert in bed in no distress. POC discussed with patient and she stated understanding. 1:1 sitter present. Patient cooperative throughout the day. Report given to [facility].

## 2021-11-28 NOTE — Progress Notes (Addendum)
PROGRESS NOTE  11/28/2021    PATIENT INFO: Cheryl Zimmerman, [age] year old female  DATE OF ADMISSION: 11/26/21    ROOM/BED LOCATION:  606/606-A  HOSPITAL DAY: 2    REVIEW OF OVERNIGHT EVENTS:  Overnight developed left arm pain, and "jerkiness" in her left hand and her voice sounds different. She did not get much sleep last night. Denies, CP/SOB      SUBJECTIVE:      Patient feels very tired this AM, did not get much sleep. Woke up with left hand pain and a raspy voice. Also endorses some numbness on the lateral aspect of her hand. Pain is constant, worse on dorsal aspect of hand. Denies every having dystonic reaction. Denies CP/SOB/vison changes/headache    OBJECTIVE:    VITAL SIGNS      11/27/21  2041 11/27/21  2041 11/27/21  2042 11/28/21  0606   BP:  115/81 115/81 (!) 162/97   Pulse:  89 89 85   Resp: _0 Temp:  98 F (36.7 C)  97.9 F (36.6 C)   TempSrc:  Oral  Oral   SpO2:  91%  93%   Weight:           INS/OUTS (PAST 24 HOURS)  I/O 24 Hrs:  In: 360 [P.O.:360]  Out: -     PHYSICAL EXAM   Gen: Sitting up in bed, in some stress  HEENT: PERRLA, MMM.  CV: RRR. Normal S1, S2. No m/r/g.   Lungs: Clear to auscultation bilaterally, no wheezes, crackles.  Abd: NABS. Soft, NTND.  Ext: Lower extremities: Warm. No edema.   Left arm: TTP on dorsal aspect of hand, increased jerkiness with voluntary movements, numbness noted on lateral aspect of hand. Grip strength 4/5. No erythema noted. Healing self-inflicted scabs on forearm.   Neuro: A&Ox4. CN 2-12 grossly intact. Moving all limbs spontaneously.  Skin: No rashes.      RECENT LABS  BMP:   Recent Labs     11/26/21  1251   NA 143   K 3.8   CL 99   CO2 33*   BUN 22*   CREAT 0.7   GLUCOSER 126     Ca, Mg, Phos:   Recent Labs     11/26/21  1251   CA 9.7     CBC:   Recent Labs     11/26/21  1251   WBC 10.0   HCT 47.7*   HGB 14.8   PLTA 308   RBC 4.85     Coagulation Labs: No results for input(s): PT, INR in the last 72 hours.    Invalid input(s): PTT  LFTs:  No results for input(s): AST, ALT, TBILI, DBILI, ALKPHOS in the last 72 hours.  Pancreatic Enzymes: No results for input(s): AMY, LIP in the last 72 hours.  Urinalysis:No results for input(s): UACOL, UACLA, UAGLU, UABIL, UAKET, SPEGRAVURINE, UAOCC, UAPH, UAPRO, UANIT, LEUKOCYTES in the last 72 hours.    Invalid input(s): UAOBU  Troponin:  TROPONIN T HS BASELINE (ng/L)   Date Value   11/24/2021 14 (H)     TROPONIN T HS 1 HOUR RESULT (ng/L)   Date Value   11/24/2021 14 (H)     TROPONIN T HS 3 HOUR RESULT (ng/L)   Date Value   11/24/2021 13 (H)     DELTA 1 HOUR TROPONIN T HS (ng/L)   Date Value   11/24/2021 0     DELTA 3 HOUR TROPONIN T  HS (ng/L)   Date Value   11/24/2021 1       MICROBIOLOGY REVIEW  No new microbiology results to review.    IMAGING AND OTHER STUDIES  No new imaging or other studies to review.    MEDICATIONS  See list in [facility]     Patient is a 51 year-old female with history of housing instability, bipolar disorder, schizoaffective disorder, polysubstance use, PTSD, multiple suicide attempts, history of systolic congestive heart failure, seizure disorder on keppra, complex pain syndrome, COPD, bronchial asthma, and obesity, presenting with suicidal ideation, with difficulty breathing which appears to be baseline for her, now needing inpatient level of psychiatric care, awaiting bed placement.      #Hand pain  #acute dystonia  Patient on aripiprazole and now endorsing left hand pain and jerky movements that are worse with voluntary movement making acute dystonia most liekly. She has no other focal deficits making stroke less likely as well as the pain in her arm.   -50 mg IV benadryl   -Touch base with psych regarding aripiprazole   -Cogentin required?   - Currently on haldol, abilify, and seroquel with signs of acute dystonia, psych okay with these 3 meds?   - DCed abilify, seroquel and haldol pending psych eval       # COPD  # Difficulty breathing  Patient has COPD and has  difficulty breathing on exertion at baseline. She smokes ~3 packs/daily. She has given some differing reports of her home oxygen use, but it seems most likely that she is not usually on home oxygen. She says "people want me to be" when asked if she is on home oxygen, but also has housing instability and says she has not used oxygen at home recently. She denies cough or wheeze, has clear lungs on exam, and dyspnea is limited to exertion and resolves quickly. Oxygen saturation 91-93 on 1 L of NC when examined upon admission. Unlikely to be COPD exacerbation given no change in cough or sputum, no wheeze, patient is able to stand and breathe without oxygen while being examined. Difficulty breathing is also not likely to be CHF exacerbation, as the patient is euvolemic on exam, was not improved with lasix, is still getting daily torsemide, denies orthopnea. Most likely cause of difficulty breathing is chronic hypoventilation, supported by metabolic alkalosis, likely in setting of chronic respiratory acidosis, improvement with oxygen administration, with some additional component of functional limitation/deconditioning, as patient says she has had limited mobility due to pain.     Patient now off oxygen and doing well. This is likely her baseline and she is fine to be maintained without supplemental O2.     - Keep off oxygen   - Continue home symbicort BID  - Update psych team that patient is medically cleared  - Albuterol prn    # Suicidal Ideation, Risk  Patient with active SI, seen by psychiatry in emergency room, determined to be high risk. Active SI.   - Section 12 criteria met  - SI precautions, 1:1 sitter   - Formally re-consult psych now that she's inpatient, share that she is medically cleared and can be placed somewhere without O2 requirement    # Chronic Pain  # Bipolar Disorder   Patient with bipolar disorder and chronic pain syndrome, has significant pain at baseline. She is very concerned with the pain and  is "begging" for pain medication to help. She says pain is worsening her depression.  Knows she takes some home medications for pain but does not know which. Reports significant depression at this time, interrelated to pain.   - Continue home gabapentin 600 mg  - Continue duloxetine 40 mg daily  - Continue doxepin 10 mg nightly  - Continue haldol 5 mg TID, consider scaling back after collateral with group home  - Use ativan prn for agitation   - Continue to engagement in empathetic listening and pursue non-pharmacologic methods of support like enabling patient to shower or brush her hair which she reports is calming to her  - Consider adding 300 mg seroquel qhs as patient says this has been helpful in the past, may help reduce anxiety and agitation while awaiting a bed    # Substance Use  Urine drug screen positive for cocaine, fentanyl upon presentation. Patient also on suboxone. Denies opioid use. Endorses marijuana, alcohol, cocaine use. History heavy alcohol use, but less active now per patient report. Low concern for withdrawal developing at this point given she's been asymptomatic on current medication regimen while in ED for 3-4 days.  - Continue suboxone 8-2 mg three times daily  - Continue acamprosate three times daily    # Hx of HFpEF  Patient with history of CHF. Unlikely to be in acute exacerbation of CHF given looks euvolemic on exam (exam slightly difficult given habitus but legs without edema, lungs sound clear), denies orthopnea.   - Start torsemide 20 mg, continue to monitor     # Gastritis  Patient has a history of gastritis. Denies any current abdominal pain, reflux symptoms.  - Continue home protonix 40 mg daily    # History of Seizures  Patient with remote history of seizures.   - Continue home oxcarbazapine 150 twice daily  - Continue home levetiracetam 750 mg twice daily    # History of Hypothyroidism  Patient with history of hypothyroidism. Controlled on home levo.  - Continue  levothyroxine 25 mcg every morning    # Constipation  - Continue home colace 100 mg daily prn    # Cardiac Risk factors  - Continue home atorvastatin 80 mg  - Continue home aspirin 81 mg    # Tobacco Use  Patient reports smoking up to 3 packs/day. Using nicotine gum and nicotine patch to good effect  - Continue nicotine patch  - Continue nicotine gum     # Allergies  - Continue home Flonase  - Continue home loratadine    # Polypharmacy  Patient on many medications and with many hospitalizations and discharges to different group homes/living facilities/unstable housing over several years. Important to assess necessity of each medication.       Diet: full     DVT Prophylaxis: subcutaneous heparin     Medication Reconciliation: Done with pharmacies, ED got med rec from group home  Was medication list from nursing facility collected & reviewed? n/a    Changes from home medication regimen: as above.    Code Status: Full Code    Health care proxy: Data Unavailable  Data Unavailable    Dispo: Inpatient Psych Facility once bed is available    Myrtis Hopping, PGY-1  p [phone]

## 2021-11-28 NOTE — Initial Assessments (Addendum)
This is a 51 year-old female with history of housing instability, bipolar disorder, schizoaffective disorder, polysubstance use, PTSD, multiple suicide attempts, history of systolic congestive heart failure, seizure disorder on keppra, complex pain syndrome, COPD, bronchial asthma, and obesity, presenting with suicidal ideation with a plan. She was seen in the ED by psychiatry and was deemed to require inpatient psychiatry hospitalization. Had difficulty breathing in ED with sats 88-90%, therefore put on 4L O2, then weaned to 2L 2 days ago, now found on RA. HD: 5.    Baseline information is not clearly known, GH or homeless. Uses a rollator, pt has it at bedside. Reports falling 4-5x per month due to legs giving out. History has several suicide attempts mentioned including cutting herself several times, requiring 30 stitches once, self strangulation with O2 tubing in O2. Psych note 3/11 <<States she does not have access to anything she can use for self-harm in the room, but "if there was anything here, I would use it." >>    Pt agreeable to ambulation though stating that is not a good day. Ambulated approx 47ft and back with rollator + minimal assist (+ 1 stand by) + a chair following due to unsteady knees L >R and 2 minor losses of balance requiring external correction. Pt had to sit down once halfway as balance deteriorated with fatigue within about 10-68ft. Pt also used the bathroom during session.    Assessment: 320lbs and unsteady,uses rollator, 2 minor losses of balance requiring external correction, 2 assist used (1 minimal A + 1 stand by) + chair following for safety. Suicidal, on 1:1. Per notes, pt is to be discharged to inpatient psych when medically cleared.     11/28/21 1030   Language Information   Language of Care English   Evaluation Type   Evaluation Type Initial Evaluation   Rehab Discipline   Rehab Discipline PT   Safety Devices   Type of Devices Call bell in place   Weight Bearing Status   RLE FWB    LLE FWB   RUE FWB   LUE FWB   Premorbid Mobility   Transfers   (unclear)   Walking assistive devices used Rollator   Living Situation   Living Setting Homeless/transient;Group home  (possibly based on notes)   Strengths   Strengths Premorbid level of function   Barriers   Barriers Comorbidities;Attitude of self   Expression   Primary Mode of Expression Verbal   RUE Assessment   RUE Assessment WFL   LUE Assessment   LUE Assessment WFL   RLE Assessment   RLE Assessment WFL   LLE Assessment   LLE Assessment WFL   Mobility / Balance   Mobility / Balance Yes   Bed Mobility   Supine to Sit Independent   Transfers   Transfer Yes   Transfer 1   Transfer From 1 Bed   Transfer Type 1 To and from   Transfer to 1 Stand   Technique 1 Sit to stand;Stand to sit   Transfer Device 1 Rollator (Four wheel walker)   Transfers 2   Transfer From 2 Standard toilet with grab bars   Transfer Type 2 To and from   Transfer to 2 Stand   Technique 2 Sit to stand;Stand to sit   Transfer Device 2   (grab bar)   Transfer Level of Assistance 2 Contact guard   Gait   Gait Yes   Gait 1   Assistive Device 1 Rollator (Four wheel walker)  Pattern 1 L Decreased stance time  (limping, a bit buckling)   Gait Assistance 1 Min assist 1  (+ chair following + 1 stand by)   Distance (Ft) 1 30 Feet  (with 2 sitting rests)   Balance   Sitting - Static Feet supported;Sits without support for > 30 sec   Sitting - Dynamic Feet supported;Bilateral upper extremity supported;Moves/returns truncal midpoint greater than 2 inches in all planes   Standing - Static Bilateral upper extremity supported;Able to maintain 60 sec   Standing - Dynamic Forward lean;Reaching for objects   Posture Rounded shoulders;Forward head;Kyphosis   Plan   Prognosis Good   PT Frequency 5x/wk   Recommendation   Recommendation   (to be discharged to inpatient psych per notes)     Care Plan:  STG:  1) S in transfers with rollator in 4 sessions  2) increased ambulation to at least 25ft without a  rest break in 4 sessions  3) Pt will verbalize at least 2 fall precautions in 4 sessions    LTG:  Increased ambulation to 53ft with breaks as needed in 7 sessions.    Recardo Evangelist PT  License [id number]  Psager [phone]

## 2021-11-29 DIAGNOSIS — N179 Acute kidney failure, unspecified: Secondary | ICD-10-CM | POA: Diagnosis not present

## 2021-11-29 DIAGNOSIS — R45851 Suicidal ideations: Secondary | ICD-10-CM | POA: Diagnosis not present

## 2021-11-29 DIAGNOSIS — I5033 Acute on chronic diastolic (congestive) heart failure: Secondary | ICD-10-CM | POA: Diagnosis not present

## 2021-11-29 DIAGNOSIS — F250 Schizoaffective disorder, bipolar type: Secondary | ICD-10-CM | POA: Diagnosis not present

## 2021-11-29 LAB — VITAMIN B12: VITAMIN B12: 301 pg/mL (ref 232–1245)

## 2021-11-29 MED ORDER — FUROSEMIDE 10 MG/ML IJ SOLN
40.00 mg | Freq: Once | INTRAMUSCULAR | Status: AC
Start: 2021-11-29 — End: 2021-11-29
  Administered 2021-11-29: 40 mg via INTRAVENOUS
  Filled 2021-11-29: qty 4

## 2021-11-29 MED ORDER — TORSEMIDE 20 MG PO TABS
40.0000 mg | ORAL_TABLET | Freq: Two times a day (BID) | ORAL | Status: DC
Start: 2021-11-29 — End: 2021-11-30
  Administered 2021-11-29 – 2021-11-30 (×2): 40 mg via ORAL
  Filled 2021-11-29 (×2): qty 2

## 2021-11-29 NOTE — Discharge Inst - Reason you came to the hospital (Addendum)
Reason you came to the hospital:     You came to the hospital because you were in distress with active thoughts about harming yourself, and you also had severe chronic pain and difficulty breathing.    Discharge Diagnosis: Chronic hypoxemia (chronically low oxygen levels), Suicidal Ideation    Description of Hospital Course:     In the hospital you were seen by our psychiatry colleagues who deemed that you needed an inpatient level of psychiatric care. While you were awaiting placement to a psychiatric facility, you were maintained on your home medications. We examined your breathing and it seemed that you were not having an acute exacerbation of congestive heart failure or COPD, but rather are having some baseline difficulty breathing. We helped by giving some IV lasix to get more fluid off. You were also seen by PT who helped you walk around with your walker and keep active while you waited for psychiatric placement.    Important Results: N/a    Please call your PCP Romero Belling [phone]) if you have any of the following symptoms:     General: fever/chills, fatigue  Cardiac: chest pain, racing heart, fainting   Respiratory: worsening difficulty breathing, wheezing, cough productive of sputum  Psychiatric: worsening thoughts of hurting yourself or others, worsening mood or anxiety, worsening hallucinations or hearing voices that others don't hear  Any other symptom or concern that is very bothersome to you.    It was a pleasure to take care of you.    Your [facility] Team

## 2021-11-29 NOTE — Progress Notes (Signed)
PROGRESS NOTE  11/29/2021    PATIENT INFO: Cheryl Zimmerman, [age] year old female  DATE OF ADMISSION: 11/26/21    ROOM/BED LOCATION:  606/606-A  HOSPITAL DAY: 3    REVIEW OF OVERNIGHT EVENTS:  No acute events overnight.    SUBJECTIVE:  This morning, patient reports feeling "so-so." She endorses some visual hallucinations yesterday, "the floor was an ocean with fish in it," but reports feeling better this morning. Denies wheezing, cough, chest pain, nausea/vomiting. Is mostly perseverating on where she will be discharged to from the inpatient psych facility, "I don't want to be discharged to the street."     OBJECTIVE:    VITAL SIGNS    11/28/21  2027 11/28/21  2028 11/28/21  2035 11/29/21  0544   BP: 125/87 125/87  126/84   Pulse: 88 86  80   Resp: 20   22   Temp:  98.6 F (37 C)  97.7 F (36.5 C)   TempSrc:  Oral  Oral   SpO2:  (!) 87% (!) 88% (!) 89%   Weight:           INS/OUTS (PAST 24 HOURS)  I/O 24 Hrs:  In: 1440 [P.O.:1440]  Out: -     PHYSICAL EXAM   WNU:UVOZDGU up in chair, watching tv with coffee and graham crackers.  HEENT: PERRLA, MMM.  CV: RRR. Normal S1, S2. No m/r/g.  Lungs:Clear to auscultation bilaterally, no wheezes, crackles.  Abd: NABS. Soft, NTND.  Ext: Warm. No edema.   Neuro: A&Ox4. CN 2-12 grossly intact. Moving all limbs spontaneously.  Skin: No rashes.  T/L/D:Left peripheral IV.     RECENT LABS  BMP:   Recent Labs     11/26/21  1251   NA 143   K 3.8   CL 99   CO2 33*   BUN 22*   CREAT 0.7   GLUCOSER 126     Ca, Mg, Phos:   Recent Labs     11/26/21  1251   CA 9.7     CBC:   Recent Labs     11/26/21  1251   WBC 10.0   HCT 47.7*   HGB 14.8   PLTA 308   RBC 4.85     Coagulation Labs: No results for input(s): PT, INR in the last 72 hours.    Invalid input(s): PTT  LFTs: No results for input(s): AST, ALT, TBILI, DBILI, ALKPHOS in the last 72 hours.  Pancreatic Enzymes: No results for input(s): AMY, LIP in the last 72 hours.  Urinalysis:No results for input(s): UACOL, UACLA,  UAGLU, UABIL, UAKET, SPEGRAVURINE, UAOCC, UAPH, UAPRO, UANIT, LEUKOCYTES in the last 72 hours.    Invalid input(s): UAOBU  Troponin:  TROPONIN T HS BASELINE (ng/L)   Date Value   11/24/2021 14 (H)     TROPONIN T HS 1 HOUR RESULT (ng/L)   Date Value   11/24/2021 14 (H)     TROPONIN T HS 3 HOUR RESULT (ng/L)   Date Value   11/24/2021 13 (H)     DELTA 1 HOUR TROPONIN T HS (ng/L)   Date Value   11/24/2021 0     DELTA 3 HOUR TROPONIN T HS (ng/L)   Date Value   11/24/2021 1       MICROBIOLOGY REVIEW  No new microbiology results to review.    IMAGING AND OTHER STUDIES  No new imaging or other studies to review.    MEDICATIONS  See list in EPIC  ASSESSMENT & PLAN   Patient is a 51 year-old female with history ofhousing instability,bipolar disorder, schizoaffective disorder, polysubstance use,PTSD, multiple suicide attempts,history ofsystolic congestive heart failure, seizure disorder on keppra, complex pain syndrome, COPD, bronchial asthma, and obesity, presenting with suicidal ideation, with some hypoxemia which appears to be baseline for her and is not indicative of acute respiratory etiology, now needing inpatient level of psychiatric care, awaiting bed placement.    # Hand pain  # Acute dystonia  Patient developed acute dystonia yesterday after initiation of aripiprazole. Had no other focal deficits making stroke unlikely, and dystonia resolved with benadryl and cessation of aripiprazole.  - Avoid aripiprazole, continue to monitor    # COPD  # Difficulty breathing  Patient has COPD and has difficulty breathing on exertion at baseline. She smokes ~3 packs/daily. She has given some differing reports of her home oxygen use, but it seems most likely that she is not usually on home oxygen. She says "people want me to be" when asked if she is on home oxygen, but also has housing instability and says she has not used oxygen at home recently. She denies cough or wheeze, has clear lungs on exam, and dyspnea is limited  to exertionand resolves quickly. Oxygen saturation 91-93 on 1 L of NC when examined upon admission.     Unlikely to be COPD exacerbationgiven no change in cough or sputum, no wheeze, patient is able to stand and breathe without oxygen while being examined. Difficulty breathing is also not likely to be CHF exacerbation, as the patient is euvolemic on exam, was not improved with lasix, is still getting daily torsemide, denies orthopnea. Most likely cause of difficulty breathing is chronic hypoventilation, supported by metabolic alkalosis (likely in setting of chronic respiratory acidosis) improvement with oxygen administration, with some additional component of functional limitation/deconditioning, as patient says she has had limited mobility due to pain. Patient has continued to be stable over several days, further supporting that this is a chronic, not acute or progressive hypoxemia.    Supplemental oxygen in patients with chronic hypoxemia has been shown to lengthen life expectancy, but as of now, the supplemental oxygen poses more significant short-term risks to the patient (self-harm via strangling self with oxygen tubing, inability to get sooner psych treatment as needing oxygen limits placement ability) that outweigh the long-term benefits of supplemental oxygen.     As this hypoxemia is chronic, a further work-up could help to elucidate the contributing factors.  If within GOC/insurance capabilities, patient could undergo outpatient work-up for chronic hypoxemia to qualify for home supplemental O2. Work-up would include PFTs (to assess severity of COPD), V/Q scan (can assess for V/Q mismatch due to chronic PE, emphysema), measurement of DLCO (to assess for reduced DLCO as additional component of hypoxemia, especially given significant smoking history high risk for emphysema).    For now, patient has been off of supplemental oxygen and is doing well. As this is likely her baseline, and short term risks  outweigh long-term benefit, she is fine to be maintained without supplemental O2.     - Keep off oxygen   - Continue home symbicort BID   - Albuterol prn    # Suicidal Ideation, Risk  Patient with active SI, seen by psychiatry in emergency room, determined to be high risk. Active SI.  - Section 12 criteria met  - SI precautions, 1:1 sitter  - Psych has been consulted and is following    # Chronic Pain  # Bipolar  Disorder   Patient withbipolar disorder andchronic pain syndrome,has significant pain at baseline. She is very concerned with the pain and is "begging" for pain medication to help. She says pain is worsening her depression. Knows she takes some home medications for pain but does not know which. Reports significant depression at this time, interrelated to pain.   - Continue home gabapentin 600 mg  - Continue duloxetine 40 mg daily  - Continue doxepin 10 mg nightly  - Continue haldol 5 mg TID, consider scaling back after collateral with group home  - Use Seroquel for 1st line and ativan 2nd line for prn for agitation   - Continue to engagement in empathetic listening and pursue non-pharmacologic methods of support like enabling patient to shower or brush her hair which she reports is calming to her    # Substance Use  Urine drug screen positive for cocaine, fentanyl upon presentation. Patient also on suboxone. Denies opioid use. Endorses marijuana, alcohol, cocaine use. History heavy alcohol use, but less active now per patient report.Low concern for withdrawal developing at this point given she's been asymptomatic on current medication regimen while in ED for 3-4 days.  - Continue suboxone 8-2 mg three times daily  - Continue acamprosate three times daily  - B12 was normal, consider thiamine    # Hx of HFpEF  Patient with history of CHF. Unlikely to be in acute exacerbation of CHF given looks euvolemic on exam (exam slightly difficult given habitus but legs without edema, lungs sound clear), denies  orthopnea.   - Continue torsemide 20 mg, continue to monitor    # Gastritis  Patient has a history of gastritis. Denies any current abdominal pain, reflux symptoms.  - Continue home protonix 40 mg daily    # History of Seizures  Patient with remote history of seizures.   - Continue home oxcarbazapine 150 twice daily  - Continue home levetiracetam 750 mg twice daily    # History of Hypothyroidism  Patient with history of hypothyroidism. Controlled on home levo.  - Continue levothyroxine 25 mcg every morning    # Constipation  - Continue home colace 100 mg daily prn    # Cardiac Risk factors  - Continue home atorvastatin 80 mg  - Continue home aspirin 81 mg    # Tobacco Use  Patient reports smoking up to 3 packs/day. Using nicotine gum and nicotine patch to good effect  - Continue nicotine patch  - Continue nicotine gum     # Allergies  - Continue home Flonase  - Continue home loratadine    # Polypharmacy  Patient on many medications and with many hospitalizations and discharges to different group homes/living facilities/unstable housing over several years. Important to assess necessity of each medication. Multiple attempts have been made to contact the patient's group home, will make another attempt today. Patient has only resided there since January, so her med list may have been different before too.     Diet:full    DVT Prophylaxis:subcutaneous heparin    Medication Reconciliation:Donewith pharmacies, ED got med rec from group home  Was medication list from nursing facility collected & reviewed?n/a    Changes from home medication regimen: as above.    Code Status:Full Code    Health care proxy:Data UnavailableData Unavailable    Dispo:Inpatient PsychFacility once bed is available    Gearldine Bienenstock, [facility] MS3  [phone]
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
## 2021-11-29 NOTE — Plan of Care (Addendum)
1:1 sitter in place. A&Ox3. VSS, afebrile. Lung sounds clear on RA. Ambulates to BR w/ rolling walker. Able to make needs known, will continue to monitor.  Problem: Daily Care  Goal: Daily care needs are met  Description: Assess and monitor ability to perform self care and identify potential discharge needs.  Outcome: Progressing     Problem: Safety  Goal: Free from accidental physical injury  Outcome: Progressing     Problem: Pain  Goal: Patient's pain/discomfort is manageable  Description: Assess and monitor patient's pain using appropriate pain scale. Collaborate with interdisciplinary team and initiate plan and interventions as ordered. Re-assess patient's pain level 30 - 60 minutes after pain management intervention.   Outcome: Progressing

## 2021-11-29 NOTE — Progress Notes (Signed)
CASE MANAGER REASSESSMENT NOTE      Case Reviewed with: MDR     Level of Care:  Inpatient      Next review date if insurance: 11/30/21    Change in status from initial assessment: N/A    New information that will affect transition plan: Medically cleared.    Anticipated Discharge plan: Discharge to IP psych once bed is available, maybe today?    Per patient choice, referrals made to:  None necessitated.
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
## 2021-11-30 DIAGNOSIS — R079 Chest pain, unspecified: Secondary | ICD-10-CM | POA: Diagnosis not present

## 2021-11-30 DIAGNOSIS — R45851 Suicidal ideations: Secondary | ICD-10-CM

## 2021-11-30 LAB — PHOSPHORUS MAGNESIUM
MAGNESIUM: 1.9 mg/dL (ref 1.6–2.6)
PHOSPHORUS: 3.9 mg/dL (ref 2.5–4.9)

## 2021-11-30 LAB — BASIC METABOLIC PANEL
ANION GAP: 11 mmol/L (ref 10–22)
ANION GAP: 19 mmol/L (ref 10–22)
BUN (UREA NITROGEN): 22 mg/dL — ABNORMAL HIGH (ref 7–18)
BUN (UREA NITROGEN): 21 mg/dL — ABNORMAL HIGH (ref 7–18)
CALCIUM: 9.4 mg/dL (ref 8.5–10.5)
CALCIUM: 9.2 mg/dL (ref 8.5–10.5)
CARBON DIOXIDE: 34 mmol/L — ABNORMAL HIGH (ref 21–32)
CARBON DIOXIDE: 27 mmol/L (ref 21–32)
CHLORIDE: 97 mmol/L — ABNORMAL LOW (ref 98–107)
CHLORIDE: 97 mmol/L — ABNORMAL LOW (ref 98–107)
CREATININE: 0.8 mg/dL (ref 0.4–1.2)
CREATININE: 0.9 mg/dL (ref 0.4–1.2)
ESTIMATED GLOMERULAR FILT RATE: >60 mL/min (ref >60)
ESTIMATED GLOMERULAR FILT RATE: >60 mL/min (ref >60)
Glucose Random: 129 mg/dL (ref 74–160)
Glucose Random: 127 mg/dL (ref 74–160)
POTASSIUM: 3.5 mmol/L (ref 3.5–5.1)
POTASSIUM: 3.7 mmol/L (ref 3.5–5.1)
SODIUM: 142 mmol/L (ref 136–145)
SODIUM: 143 mmol/L (ref 136–145)

## 2021-11-30 LAB — TROPONIN T HS RANDOM: TROPONIN T HS RANDOM: 17 ng/L — ABNORMAL HIGH (ref 0–10)

## 2021-11-30 LAB — NT-PROBNP: NT-proBNP: 42 pg/mL (ref 0–125)

## 2021-11-30 MED ORDER — FUROSEMIDE 10 MG/ML IJ SOLN
80.00 mg | Freq: Once | INTRAMUSCULAR | Status: AC
Start: 2021-11-30 — End: 2021-11-30
  Administered 2021-11-30: 80 mg via INTRAVENOUS
  Filled 2021-11-30: qty 8

## 2021-11-30 MED ORDER — FUROSEMIDE 10 MG/ML IJ SOLN
80.00 mg | Freq: Once | INTRAMUSCULAR | Status: AC
Start: 2021-12-01 — End: 2021-12-01
  Administered 2021-12-01: 80 mg via INTRAVENOUS
  Filled 2021-11-30: qty 8

## 2021-11-30 MED ORDER — PRAZOSIN HCL 1 MG PO CAPS
1.0000 mg | ORAL_CAPSULE | Freq: Every evening | ORAL | Status: DC
Start: 2021-11-30 — End: 2021-12-02
  Administered 2021-11-30 – 2021-12-01 (×2): 1 mg via ORAL
  Filled 2021-11-30 (×2): qty 1

## 2021-11-30 NOTE — Progress Notes (Addendum)
Pt discussed during MDR, anticipate that pt will be transferred to inpatient psych pending bed availability. SW met with pt who reports that she lives at [facility] in [place] (group home through [facility]) for the past few months, prior to that pt reports that she was homeless. Pt states that she does not wish to return to the group home, pt is aware that the plan is for her to go to inpatient psych pending bed availability.     Pt states that she receives social security disability benefits as well as SNAP benefits for financial support. Pt reports history of polysubstance use. Per chart review, pt's urine was positive for cocaine on 3/8. SW used motivational interview with pt.      Pt is requesting for writer to call group home and ask for her clothes and personal items to be brought to the hospital. SW called [phone] spoke with Pilar Plate who reports that group home manager is not available at this time, will call writer back.     Per chart review, pt does not have a health care proxy. SW educated pt re: process. Pt declined to appoint one during this hospitalization. SW will remain available.     **Addendum:    Received call back from [place] the group home manager who reports that she will not be able to arrange for pt's belongings to be sent to the hospital. Ophelia Shoulder also reports that she is aware from third party that pt does not want to return to group home. Tania states that she would have to have a discussion with pt, and [place] to discuss options. Pt made aware.   SW to provide hand off to inpatient psych team upon transfer.

## 2021-11-30 NOTE — Progress Notes (Signed)
S:  I am alright, My back just hurts, I need the surgery.  O: Please see Vital Sign and Pain Assessment flowsheets for vitals and pain documentation.  Plan of care has been reviewed.   11/30/21 1107   Language Information   Language of Care English   Rehab Discipline   Rehab Discipline PT   Weight Bearing Status   RLE FWB   LLE FWB   RUE FWB   LUE FWB   Mobility / Balance   Mobility / Balance Yes   Bed Mobility   Supine to Sit Independent   Transfers   Transfer Yes   Transfer 1   Transfer From 1 Bed   Transfer Type 1 To and from   Transfer to 1 Stand   Technique 1 Sit to stand;Stand to sit   Transfer Device 1 Rollator (Four wheel walker)   Transfer Level of Assistance 1 Independent   Transfers 2   Transfer From 2 Stand   Transfer Type 2 To and from   Transfer to 2 Standard toilet with grab bars   Technique 2 Sit to stand;Stand to sit   Transfer Device 2 None  (grab bar)   Transfer Level of Assistance 2 Independent   Gait   Gait Yes   Gait 1   Assistive Device 1 Rollator (Four wheel walker)   Pattern 1 L Decreased stance time   Gait Assistance 1 Close supervision   Distance (Ft) 1 200 Feet  (100' x2 1 seated rest.)   Balance   Sitting - Static Feet supported;Sits without support for > 30 sec   Sitting - Dynamic Feet supported;No upper extremity supported   Standing - Static Bilateral upper extremity supported;Able to maintain 60 sec   Standing - Dynamic Forward lean;Reaching for objects   Posture Rounded shoulders;Forward head;Kyphosis   Ther Exercise   Therapeutic Exercise? No   Safety Devices   Type of Devices   (1 to 1)   Plan   Prognosis Good   PT Frequency 5x/wk   Recommendation   Equipment Recommended Rollator (Four wheel walker)       A:  Pt transfers independently from bed. Sit<>stand with rollator with independence.  Increased ambulation today, 200' (( 100' x2 , one seated rest. ) Using rollator, with supervision. No LOB.  Forward posture. Antalgic gait. In room . Sitter present.   P: Continue per PT Care  Plan.   Monia Pouch, PTA, Lic # [id number]

## 2021-11-30 NOTE — Plan of Care (Signed)
Problem: Safety  Goal: Free from accidental physical injury  Outcome: Progressing     Problem: Pain  Goal: Patient's pain/discomfort is manageable  Description: Assess and monitor patient's pain using appropriate pain scale. Collaborate with interdisciplinary team and initiate plan and interventions as ordered. Re-assess patient's pain level 30 - 60 minutes after pain management intervention.   Outcome: Progressing    Pt alert to self and situation, confused on time and location. Pt forgetful. Calm and cooperative this shift. 1:1 sitter in place for SI. SBA in room with walker. Pt endorsing chronic generalized pain, medicated appropriately per mar with little effect. Frequently asks for nicotine gum. Vitals stable on RA. Pt reports shortness of breath upon exertion, relieved with rest. Pt able to make needs known.

## 2021-11-30 NOTE — Progress Notes (Signed)
PROGRESS NOTE  11/30/2021    PATIENT INFO: Cheryl Zimmerman, [age] year old female  DATE OF ADMISSION: 11/26/21    ROOM/BED LOCATION:  606/606-A  HOSPITAL DAY: 4    REVIEW OF OVERNIGHT EVENTS:  No acute events overnight.    SUBJECTIVE:  This morning patient reporting chest pain, localized to the left side, "heaviness" that is worse with deep breathing. Pain does not radiate. Denies sweating, nausea/vomiting, headache, dizziness/lightheadedness, worsening dyspnea, cough. Pain not positional, not worse lying down/leaning forward.    She is also feeling more lethargic, tired, "out of it." Says she forgot where she is.     OBJECTIVE:    VITAL SIGNS      11/30/21  0604 11/30/21  0606 11/30/21  0654 11/30/21  0852   BP: (!) 148/93   121/84   Pulse: 84   79   Resp: 20   22   Temp: 97.8 F (36.6 C)   98 F (36.7 C)   TempSrc: Oral   Oral   SpO2: (!) 88% 92%  92%   Weight:   (!) 149.1 kg (328 lb 11.2 oz)        INS/OUTS (PAST 24 HOURS)  I/O 24 Hrs:  In: 1320 [P.O.:1320]  Out: 450 [Urine:450]    PHYSICAL EXAM   YQM:VHQIONGEXBM bed, watching tv.  HEENT: PERRLA, MMM.  CV: RRR. Normal S1, S2. No m/r/g.  Lungs:Clear to auscultation bilaterally, no wheezes, crackles.  Abd: NABS. Soft, NTND.  Ext: Warm.  2+ edema of the legs to bilateral mid-shins.  Neuro: A&Ox4. CN 2-12 grossly intact.Moving all limbs spontaneously.  Skin: No rashes.  T/L/D:Left peripheral IV.    RECENT LABS  BMP:   Recent Labs     11/30/21  0712   NA 142   K 3.5   CL 97*   CO2 34*   BUN 22*   CREAT 0.8   GLUCOSER 129     Ca, Mg, Phos:   Recent Labs     11/30/21  0712   CA 9.4     CBC: No results for input(s): WBC, HCT, HGB, PLTA, RBC in the last 72 hours.  Coagulation Labs: No results for input(s): PT, INR in the last 72 hours.    Invalid input(s): PTT  LFTs: No results for input(s): AST, ALT, TBILI, DBILI, ALKPHOS in the last 72 hours.  Pancreatic Enzymes: No results for input(s): AMY, LIP in the last 72 hours.  Urinalysis:No results for  input(s): UACOL, UACLA, UAGLU, UABIL, UAKET, SPEGRAVURINE, UAOCC, UAPH, UAPRO, UANIT, LEUKOCYTES in the last 72 hours.    Invalid input(s): UAOBU  Troponin:  TROPONIN T HS RANDOM (ng/L)   Date Value   11/30/2021 17 (H)     DELTA 1 HOUR TROPONIN T HS (ng/L)   Date Value   11/24/2021 0     DELTA 3 HOUR TROPONIN T HS (ng/L)   Date Value   11/24/2021 1       MICROBIOLOGY REVIEW  No new microbiology results to review.    IMAGING AND OTHER STUDIES  No new imaging or other studies to review.    MEDICATIONS  See list in [place]     Patient is a 51 year-old female with history ofhousing instability,bipolar disorder, schizoaffective disorder, polysubstance use,PTSD, multiple suicide attempts,history ofsystolic congestive heart failure, seizure disorder on keppra, complex pain syndrome, COPD, bronchial asthma, and obesity, presenting with suicidal ideation, with some hypoxemia which appears to be baseline  for her and is not indicative of acute respiratory etiology, now needing inpatient level of psychiatric care, awaiting bed placement.    # Hand pain  # Acute dystonia  Patient developed acute dystonia yesterday after initiation of aripiprazole. Had no other focal deficits making stroke unlikely, and dystonia resolved with benadryl and cessation of aripiprazole.  - Avoid aripiprazole, continue to monitor    # COPD  # Difficulty breathing  Patient has COPD and has difficulty breathing on exertion at baseline. She smokes ~3 packs/daily. She has given some differing reports of her home oxygen use, but it seems most likely that she is not usually on home oxygen. She says "people want me to be" when asked if she is on home oxygen, but also has housing instability and says she has not used oxygen at home recently. She denies cough or wheeze, has clear lungs on exam, and dyspnea is limited to exertionand resolves quickly. Oxygen saturation 91-93 on 1 L of NC when examined upon admission.     Unlikely to be  COPD exacerbationgiven no change in cough or sputum, no wheeze, patient is able to stand and breathe without oxygen while being examined. CHF was likely not contributing to dyspnea at presentation as the patient was looking euvolemic and was still on daily torsemide. However, the patient was getting a torsemide dose that was lower than her home dose as we were not able to get the group home med list until several days into her stay here. So she likely is now volume overloaded, contributing to dyspnea (see below for discussion).    Additional component of chronic dyspnea is likely chronic hypoventilation, supported by metabolic alkalosis (likely in setting of chronic respiratory acidosis) improvement with oxygen administration, with some additional component of functional limitation/deconditioning, as patient says she has had limited mobility due to pain.    Supplemental oxygen in patients with chronic hypoxemia has been shown to lengthen life expectancy, but as of now, the supplemental oxygen poses more significant short-term risks to the patient (self-harm via strangling self with oxygen tubing, inability to get sooner psych treatment as needing oxygen limits placement ability) that outweigh the long-term benefits of supplemental oxygen.     As this hypoxemia is chronic, a further work-up could help to elucidate the contributing factors.    If within GOC/insurance capabilities, patient could undergo outpatient work-up for chronic hypoxemia to qualify for home supplemental O2. Work-up would include PFTs (to assess severity of COPD), V/Q scan (can assess for V/Q mismatch due to chronic PE, emphysema), measurement of DLCO (to assess for reduced DLCO as additional component of hypoxemia, especially given significant smoking history high risk for emphysema).    For now, patient has been off of supplemental oxygen and is doing well. As this is likely her baseline, and short term risks outweigh long-term benefit, she  is fine to be maintained without supplemental O2.     - Keep off oxygen   - Continue home symbicort BID   - Albuterol prn    # Suicidal Ideation, Risk  Patient with active SI, seen by psychiatry in emergency room, determined to be high risk. Active SI.Psych has been consulted and is following. Bed search in process, being temporarily held today as patient now needs diuresis.  - Section 12 criteria met  - SI precautions, 1:1 sitter    # Hx of HFpEF  # Chest heaviness/pain  Patient with history of CHF. Now looking volume overloaded on exam (bilateral LE edema,  other markers hard to assess) and bedside POCUS yesterday showing distended JVP. As patient had been on a lower torsemide dose here than she was at home, increasing volume overload makes sense. Patient is now receiving IV lasix to try to achieve euvolemia so patient can be medically cleared again to resume psych bed search.    Patient's chest pain is also most likely related to the volume overload. EKG this morning was reassuring against ACS, the pain has been progressive over the past day, describes it as a "heaviness" that is worse with breathing. PE unlikely, wells 0.     - 80 mg IV lasix in AM, monitor output  - Get repeat BMP to check for AKI, if stable creatinine then consider another dose lasix in PM  - Hold home torsemide in favor of IV lasix for now  - Monitor for improvement of chest heaviness with diuresis  - Monitor troponin    # Chronic Pain  # Bipolar Disorder  Patient withbipolar disorder andchronic pain syndrome,has significant pain at baseline. She is very concerned with the pain and is "begging" for pain medication to help. She says pain is worsening her depression. Knows she takes some home medications for pain but does not know which. Reports significant depression at this time, interrelated to pain.   - Continue home gabapentin 600 mg  - Continue duloxetine 40 mg daily  - Continue doxepin 10 mg nightly  - Continue haldol 5 mg TID,  consider scaling back after collateral with group home  - Use Seroquel for 1st line and ativan 2nd line for prn for agitation   - Continue to engagement in empathetic listening and pursue non-pharmacologic methods of support like enabling patient to shower or brush her hair which she reports is calming to her    # Substance Use  Urine drug screen positive for cocaine, fentanyl upon presentation. Patient also on suboxone. Denies opioid use. Endorses marijuana, alcohol, cocaine use. History heavy alcohol use, but less active now per patient report.Low concern for withdrawal developing at this point given she's been asymptomatic on current medication regimen while in ED for 3-4 days.  - Continue suboxone 8-2 mg three times daily  - Continue acamprosate three times daily  - B12 was normal    # Gastritis  Patient has a history of gastritis. Denies any current abdominal pain, reflux symptoms.  - Continue home protonix 40 mg daily    # History of Seizures  Patient with remote history of seizures.   - Continue home oxcarbazapine 150 twice daily  - Continue home levetiracetam 750 mg twice daily    # History of Hypothyroidism  Patient with history of hypothyroidism. Controlled on home levo.  - Continue levothyroxine 25 mcg every morning    # Constipation  - Continue home colace 100 mg daily prn    # Cardiac Risk factors  - Continue home atorvastatin 80 mg  - Continue home aspirin 81 mg    # Tobacco Use  Patient reports smoking up to 3 packs/day. Using nicotine gum and nicotine patch to good effect  - Continue nicotine patch  - Continue nicotine gum     # Allergies  - Continue home Flonase  - Continue home loratadine    # Polypharmacy  Patient on many medications and with many hospitalizations and discharges to different group homes/living facilities/unstable housing over several years. Important to assess necessity of each medication. Group home medication list was obtained on 3/13. Able to reconcile with  her  current meds and going forward will be on the list consistent with group home list. Scanned into chart.    Diet:full    DVT Prophylaxis:subcutaneous heparin    Medication Reconciliation:Donewith pharmacies, ED got med rec from group home    Was medication list from nursing facility collected & reviewed?n/a    Changes from home medication regimen: as above.    Code Status:Full Code    Health care proxy:Data UnavailableData Unavailable    Dispo:Inpatient PsychFacility once bed is available    Gearldine Bienenstock, [facility] MS3  [phone]    Discussed with resident Dr. Gwenlyn Perking and attending Dr. Leone Haven, MD.

## 2021-11-30 NOTE — Progress Notes (Signed)
Pt's case discussed at [facility].  Awaiting psych placement.  Variance day noted.  Clinical sent .  CM will remain available.

## 2021-11-30 NOTE — Initial Assessments (Signed)
Nutrition Documentation Form  Nutrition Initial Assessment        This is a 51 year old female with history ofhousing instability,bipolar disorder, schizoaffective disorder, polysubstance use,PTSD, multiple suicide attempts,history ofsystolic congestive heart failure, seizure disorder on keppra, complex pain syndrome, COPD, bronchial asthma, and obesity, presenting with suicidal ideation with a plan.     Nutrition related labs: Decreased chloride, increased carbon dioxide and BUN  BMs: Last BM 3/13  Documented PO intake:  100%    Pt has been eating well on the unit with 100% PO across all meals. Hemoglobin A1C hasn't been checked since June 2021, was borderline elevated, could consider running A1C to trend.     Pt is medically cleared and awaiting inpatient psych bed.       Patient Active Problem List:     Chest pain     Alcohol withdrawal ([name])     Psychiatric disorder     SOB (shortness of breath)     Abscess of left arm     Hypoxia     Suicidal behavior with attempted self-injury [facility])    Past Medical History:  schizo: Bipolar affective ([name])  No date: Depression  depress: Schizo affective schizophrenia ([name])  No date: Substance abuse ([name])                                                                            Anthropometrics  Weight: (!) 149.1 kg (328 lb 11.2 oz)         Most Recent Weight Reading(s)  11/30/21 : (!) 149.1 kg (328 lb 11.2 oz)  ]  BMI- 48.4    Current Diet:   Orders Placed This Encounter      Diet - Base Diet: Regular              Skin Integrity: Intact  Skin Location:  (back & coccyx intact)      Recent labs:  Lab Results   Component Value Date    CA 9.4 11/30/2021    NA 142 11/30/2021    K 3.5 11/30/2021    CL 97 (L) 11/30/2021    CO2 34 (H) 11/30/2021    BUN 22 (H) 11/30/2021    CREAT 0.8 11/30/2021    GLUCOSER 129 11/30/2021     Lab Results   Component Value Date    WBC 10.0 11/26/2021    HGB 14.8 11/26/2021    HCT 47.7 (H) 11/26/2021    PLTA 308 11/26/2021    RBC 4.85 11/26/2021     ALBUMIN 2.9 (L) 06/02/2011       Scheduled Medications:   furosemide  80 mg Intravenous Once    haloperidol  5 mg Oral TID    heparin (porcine)  5,000 Units Subcutaneous Q12H [facility]    nicotine  1 patch Transdermal Daily    doxepin  10 mg Oral Nightly    DULoxetine  40 mg Oral Daily    melatonin  3 mg Oral Nightly    methocarbamol  750 mg Oral TID    aspirin  81 mg Oral Daily    loratadine  10 mg Oral Daily    fluticasone  1 spray Each Nostril Daily  levETIRAcetam  750 mg Oral BID    atorvastatin  80 mg Oral Nightly    gabapentin  600 mg Oral TID    pantoprazole  40 mg Oral Daily    buprenorphine-naloxone  8 mg Sublingual TID    budesonide-formoterol  2 puff Inhalation 2 times daily    levothyroxine  25 mcg Oral DAILY    OXcarbazepine  150 mg Oral BID    ferrous sulfate  324 mg Oral Q48H    acamprosate  333 mg Oral TID     PRN Medications: QUEtiapine, LORazepam, diclofenac, sennosides, nicotine polacrilex, docusate sodium, ibuprofen, albuterol HFA         Nutrition Diagnosis           Nutrition Risk Level Initial: Low risk Follow Up: Low risk   Follow Up Date Nutrition Follow Up Date: 12/07/21 (Med clear, awaiting inpatient psych bed. CTM)    [name] (Problem Etiology Signs/ Symptoms) Statement:  1. Predicted excessive energy intake related to high BMI as evidenced by BMI of 48.4.     Interventions:                                        1. Continue Regular diet.  2. Weekly weights to trend     Monitoring and evaluation:   PO intake   GI/bowel function   Skin integrity   Weights      Goals:   Pt to consume >75% most meals   Weight stability during LOS   Maintenance of skin integrity   GI/bowel function wnl      Name Cheryl Zimmerman  RD,LDN    Date 11/30/2021    Time 2:09 PM       Pager [phone]

## 2021-11-30 NOTE — Consults (Signed)
[facility] Psychiatry Service Follow-up Note    Prior psychiatry consult notes as well as interim history reviewed in EPIC.   Remains safe on one-to-one.  Adherent to medications. Appearing volume overloaded, per medical team needs to be diuresed. Taken off bed search.   On interview, appears depressed, anxious. Complains of difficulty sleeping, citing nightmares. Asking for medication to help with this. Feels better in terms of suicidality but still reports that she needs hospitalization. No HI. Continues experiencing VH of bloody son. No AH. Asking for both 1:1 to be removed and shaving privileges. Swears "on my dead son" that she can be safe with shaving. Is understanding when told that we can start with shaving if nursing is comfortable and observes, and continue reassessing each day for 1:1.   BP 121/84    Pulse 79    Temp 98 F (36.7 C) (Oral)    Resp 22    Wt (!) 149.1 kg (328 lb 11.2 oz)    SpO2 92%     Standing medications:    furosemide  80 mg Intravenous Once    haloperidol  5 mg Oral TID    heparin (porcine)  5,000 Units Subcutaneous Q12H [facility]    nicotine  1 patch Transdermal Daily    doxepin  10 mg Oral Nightly    DULoxetine  40 mg Oral Daily    melatonin  3 mg Oral Nightly    methocarbamol  750 mg Oral TID    aspirin  81 mg Oral Daily    loratadine  10 mg Oral Daily    fluticasone  1 spray Each Nostril Daily    levETIRAcetam  750 mg Oral BID    atorvastatin  80 mg Oral Nightly    gabapentin  600 mg Oral TID    pantoprazole  40 mg Oral Daily    buprenorphine-naloxone  8 mg Sublingual TID    budesonide-formoterol  2 puff Inhalation 2 times daily    levothyroxine  25 mcg Oral DAILY    OXcarbazepine  150 mg Oral BID    ferrous sulfate  324 mg Oral Q48H    acamprosate  333 mg Oral TID       PRN Medications:   Current Facility-Administered Medications   Medication Dose Route Frequency Last Admin    QUEtiapine  50 mg Oral Q6H PRN 50 mg at 11/30/21 0943    LORazepam  1 mg Oral BID PRN 1 mg at  11/30/21 1139    diclofenac  4 g Topical 4x Daily PRN 4 g at 11/30/21 0844    sennosides  17.2 mg Oral Nightly PRN      nicotine polacrilex  2 mg Oral Q1H PRN 2 mg at 11/30/21 1258    docusate sodium  100 mg Oral Daily PRN      ibuprofen  400 mg Oral Q6H PRN 400 mg at 11/30/21 0843    albuterol HFA  2 puff Inhalation Q6H PRN 2 puff at 11/25/21 2139        Mental Status Exam:  Mental Status Exam   General Appearance: Dressed in hospital gown/johnny;Unkempt  Behavior: Cooperative;Good eye contact  Level of Consciousness: Alert  Orientation Level: Grossly Intact  Attention/Concentration: WNL  Mannerisms/Movements: No abnormal mannerisms/movements  Speech Quality and Rate: WNL  Speech Clarity: Clear  Speech Tone: Normal vocal inflection  Vocabulary/Fund of Knowledge: WNL  Memory: Grossly intact  Thought Process & Associations: Goal-directed;Linear  Dissociative Symptoms: None  Thought Content: No abnormalities reported or observed  Delusions: None  Hallucinations: Visual  Suicidal Thoughts: Active thoughts;Plans;Acute;Chronic;Reasons for not doing it;Future oriented  Homicidal Thoughts: None  Mood: Depressed/Sad;Anxious  * Mood Comment: "Sleeping has been difficult"  Affect: Depressed;Anxious;Congruent w/mood;Full range  Judgment: Fair  Insight: Fair    Impression:  Cheryl Zimmerman is a 51 year old English-speaking woman with longstanding psych history, including chart diagnoses of PTSD, SCAD, BPAD, and PSUD, multiple hospitalizations starting in childhood, on antipsychotic medication since age 63, multiple prior suicide attempts and SIB requiring medical attention, severe head trauma and seizure d/o, and complex medical illness, who lives in a group home, presenting to ED with increasing suicidal ideation, hallucinations, and trauma reexperiencing symptoms, admitted to medicine for evaluation of need for supplemental oxygen after attempting to strangle self with oxygen tubing in ED.    Diagnostic uncertainty  given overlap in chart diagnoses.  Patient identifies her most concerning symptoms are those associated with PTSD, namely nightmares, intrusive memories.  Visual hallucinations may be decompensation of SCAD, trauma-related (e.g. flashbacks), major depressive episode with psychotic features, or transient psychosis in the setting of stress as seen in some personality disorders.  Memory deficits on exam may be due to severe history of head injury, polypharmacy and substance use, or vitamin deficiency.    In terms of risk, patient endorses SI with intent and method (states she will jump in front of a train at the nearest station as soon as she is discharged) and believes that ending her life will reunite her and have been with her deceased son.  Her trauma-reexperiencing and psychotic symptoms related to her son are heightening her risk of suicide.  Additional risk factors include history of multiple attempts, recent SIB requiring medical attention, history of head trauma, comorbid substance use, preparatory behaviors/attempt in ED, impulsivity, perceived lack of social support, and chronic pain.  There are a few mitigating risk factors and patient is not agreeable to safety planning.  For this reason, patient meets criterion 1 of section 12 and inpatient psychiatric care is necessary to optimize medications, provide diagnostic clarification, and mitigate risk of harm to self.    DSM-V    Primary Diagnosis: PTSD (post-traumatic stress disorder)   Diagnosis: Schizoaffective disorder, bipolar type ([name])   Psychosocial and Contextual Factors: Housing problems     3/12: Patient remains with active SI with intent.  Unclear what patient's acute involuntary movements represent, but does not appear to be dystonic reaction.  Would not start benztropine or Benadryl at this time.  Okay to continue psychiatric medications.  Since patient is not taking Abilify at home, can discontinue that now.  Would be good to reach out to  provider tomorrow to collaborate on plan moving forward.  Patient may have some tardive dyskinesia related to long-term antipsychotic use.  Remains on section.  3/13: med list confirmed from group home, OK to continue holding home abilify. Still requiring 1:1 for active suicidal ideation   3/14: still requiring 1:1, reasonable to allow for shaving if nursing is comfortable and observing, reasonable to start prazosin 1mg  qhs for PTSD-related nightmares    Plan:   Agree with previous C/L plan with bolded additions :    -Patient meets section 12 criteria and may not leave AMA  -Bed search pending, likely geri due to medical complexity and use of rolling walker - we will look into if regular adult psych bed is feasible  - continue home medications   -Encourage pt to take quetiapine 50mg  q6h prn for anxiety before lorazepam  1mg  bid  -Ok for pt to have pen, hairbrush, plastic spoon, and other non-sharp objects under supervision. Please remove once pt is done with the objects.  -ok for supervised shaving   -Patient should remain on 1:1 observation for continued suicidal intent.  -Consider obtaining thiamine, B12 levels given extensive alcohol use history and memory deficits.  -Okay to discontinue Abilify at this time as patient not taking at home  -please start prazosin 1mg  qhs for nightmares, can go up to 2mg  qhs after 2 doses, hold for sbp<90    Discussed case with Dr. Dimas AguasHoward    Thank you for this consult.  [facility] will continue to follow.  For questions over the weekend please page on-call psychiatry resident at [phone].    Richardean Canalodd W. Jahkai Yandell, MD  Psychiatry PGY-2, [phone]

## 2021-11-30 NOTE — Plan of Care (Signed)
Problem: Tobacco/Nicotine Abuse  Goal: Risk control - tobacco abuse  Description: Actions to eliminate or reduce tobacco use.  Outcome: Progressing  Note: Nicotine patch in place, nicotine gum provided     Problem: Daily Care  Goal: Daily care needs are met  Description: Assess and monitor ability to perform self care and identify potential discharge needs.  Outcome: Adequate for Discharge  Intervention: Assess ability to perform self care  Note: Patient noted to be sleeping at change of shift, bedside hand off  Waking up for her meds having c/o not being able to breath sats 92% LS clear, c/o L "CP" pointing to L breast, no diaphoresis or pain radiating, VSS, Dr. Elie Confer paged, pain meds provided will monitor   Intervention: Assess potential discharge needs  Note: Plan to dc to psych when bed ready      Problem: Knowledge Deficit  Goal: Patient/S.O. demonstrates understanding of disease process, treatment plan, medications, and discharge instructions.  Description: Complete learning assessment and assess knowledge base  11/30/2021 0907 by Lucina Mellow, RN  Outcome: Adequate for Discharge  11/30/2021 0907 by Lucina Mellow, RN  Outcome: Progressing  Intervention: Provide teaching at level of understanding  11/30/2021 0907 by Lucina Mellow, RN  Note: Patient noted to be very angry and agitated with staff, stating "she needs to have her ass whooped," towards the CNA  11/30/2021 0907 by Lucina Mellow, RN  Note: Patient agitated and angry      Problem: Safety  Goal: Free from accidental physical injury  Intervention: Assess patient for fall risk  Note: 1:1 remains in place secondary to active SI, patient agitated and angry with staff, c/o "I don't feel good," then voicing, "I am going to beat her ass," towards sitter who was in the room while writer was assessing patient      Problem: Pain  Goal: Patient's pain/discomfort is manageable  Description: Assess and monitor patient's pain using appropriate pain scale.  Collaborate with interdisciplinary team and initiate plan and interventions as ordered. Re-assess patient's pain level 30 - 60 minutes after pain management intervention.   Intervention: Assess patient's pain level  Note: C/o 8/10 CP pointing to L breast, Dr. Elie Confer aware, pain meds provided, orders received and observed

## 2021-12-01 LAB — VENOUS BLOOD GAS
VBG POTASSIUM: 3.3 mmol/L — ABNORMAL LOW (ref 3.5–5.1)
VEN PERCENT OXYGEN SATURATION: 97.4 %
VENOUS BASE EXCESS: 11.6 mmol/L — ABNORMAL HIGH (ref 2–3)
VENOUS BICARBONATE: 37.8 — ABNORMAL HIGH (ref 23–28)
VENOUS FIO2: 21
VENOUS PARTIAL CARBON DIOXIDE: 53 mmHg — ABNORMAL HIGH (ref 41.0–51.0)
VENOUS PARTIAL PRESSURE OXYGEN: 74 mmHG
VENOUS PATIENTS TEMP: 98.2
VENOUS pH: 7.46 — ABNORMAL HIGH (ref 7.31–7.41)

## 2021-12-01 MED ORDER — QUETIAPINE FUMARATE 50 MG PO TABS
50.0000 mg | ORAL_TABLET | Freq: Four times a day (QID) | ORAL | Status: DC | PRN
Start: 2021-12-01 — End: 2021-12-02
  Administered 2021-12-01 – 2021-12-02 (×2): 50 mg via ORAL
  Filled 2021-12-01 (×2): qty 1

## 2021-12-01 MED ORDER — FUROSEMIDE 10 MG/ML IJ SOLN
120.00 mg | Freq: Once | INTRAMUSCULAR | Status: AC
Start: 2021-12-01 — End: 2021-12-01
  Administered 2021-12-01: 120 mg via INTRAVENOUS
  Filled 2021-12-01: qty 12

## 2021-12-01 MED ORDER — FUROSEMIDE 10 MG/ML IJ SOLN
120.00 mg | Freq: Once | INTRAMUSCULAR | Status: AC
Start: 2021-12-02 — End: 2021-12-02
  Administered 2021-12-02: 120 mg via INTRAVENOUS
  Filled 2021-12-01: qty 12

## 2021-12-01 MED ORDER — NAPROXEN 250 MG PO TABS
250.0000 mg | ORAL_TABLET | Freq: Two times a day (BID) | ORAL | Status: DC
Start: 2021-12-01 — End: 2021-12-02
  Administered 2021-12-01: 250 mg via ORAL
  Filled 2021-12-01 (×2): qty 1

## 2021-12-01 NOTE — Progress Notes (Signed)
PROGRESS NOTE  12/01/2021    PATIENT INFO: Cheryl Zimmerman, [age] year old female  DATE OF ADMISSION: 11/26/21    ROOM/BED LOCATION:  606/606-A  HOSPITAL DAY: 5    REVIEW OF OVERNIGHT EVENTS:  No acute events overnight.    SUBJECTIVE:  This morning patient reporting significant difficulty sleeping. Says she is feeling exhausted, hasn't slept well in days. Feels like every time she tries to go to sleep she is jolted awake. Otherwise, reports feeling "fine." Denies chest pain, difficulty breathing, nausea/vomiting, worsening SI or AVH.    OBJECTIVE:    VITAL SIGNS      11/30/21  2013 11/30/21  2027 12/01/21  0533 12/01/21  0943   BP: 115/87 130/81 111/78    Pulse: 75 89 85    Resp: 20 18 20 20    Temp:  98 F (36.7 C) 98.2 F (36.8 C)    TempSrc:  Oral Oral    SpO2:  92% 90%    Weight:   (!) 149.7 kg (330 lb)        INS/OUTS (PAST 24 HOURS)  I/O 24 Hrs:  In: 1320 [P.O.:1320]  Out: 2200 [Urine:2200]    PHYSICAL EXAM   VWU:[id number] bed, eyes closed. Occassionally mumbles/drowses off during speaking.  HEENT: PERRLA, MMM.  CV: RRR. Normal S1, S2. No m/r/g.  Lungs:Clear to auscultation bilaterally, no wheezes, crackles.  Abd: NABS. Soft, NTND.  Ext: Warm.  2+ edema of the legs to the mid-shins.  Neuro: A&Ox4. CN 2-12 grossly intact.Moving all limbs spontaneously.  Skin: No rashes.  T/L/D:Left peripheral IV.    RECENT LABS  BMP:   Recent Labs     11/30/21  0712 11/30/21  1455   NA 142 143   K 3.5 3.7   CL 97* 97*   CO2 34* 27   BUN 22* 21*   CREAT 0.8 0.9   GLUCOSER 129 127     Ca, Mg, Phos:   Recent Labs     11/30/21  0712 11/30/21  1455   CA 9.4 9.2   MG  --  1.9   PHOS  --  3.9     CBC: No results for input(s): WBC, HCT, HGB, PLTA, RBC in the last 72 hours.  Coagulation Labs: No results for input(s): PT, INR in the last 72 hours.    Invalid input(s): PTT  LFTs: No results for input(s): AST, ALT, TBILI, DBILI, ALKPHOS in the last 72 hours.  Pancreatic Enzymes: No results for input(s): AMY, LIP in  the last 72 hours.  Urinalysis:No results for input(s): UACOL, UACLA, UAGLU, UABIL, UAKET, SPEGRAVURINE, UAOCC, UAPH, UAPRO, UANIT, LEUKOCYTES in the last 72 hours.    Invalid input(s): UAOBU  Troponin:  TROPONIN T HS RANDOM (ng/L)   Date Value   11/30/2021 17 (H)     DELTA 1 HOUR TROPONIN T HS (ng/L)   Date Value   11/24/2021 0     DELTA 3 HOUR TROPONIN T HS (ng/L)   Date Value   11/24/2021 1       MICROBIOLOGY REVIEW  No new microbiology results to review.    IMAGING AND OTHER STUDIES  No new imaging or other studies to review.    MEDICATIONS  See list in [place]     Patient is a 51 year-old female with history ofhousing instability,bipolar disorder, schizoaffective disorder, polysubstance use,PTSD, multiple suicide attempts,history ofsystolic congestive heart failure, seizure disorder on keppra, complex pain syndrome, COPD,  bronchial asthma, and obesity, presenting with suicidal ideation, with some hypoxemia which appears to be baseline for her and is not indicative of acute respiratory etiology, now needing inpatient level of psychiatric care, awaiting bed placement. Developed some volume overload, now needing diuresis prior to psych placement.    # Hand pain  # Acute dystonia  Patient developed acute dystonia yesterday after initiation of aripiprazole. Had no other focal deficits making stroke unlikely, and dystonia resolved with benadryl and cessation of aripiprazole.  - Avoid aripiprazole, continue to monitor    # COPD  # Difficulty breathing  Patient has COPD and has difficulty breathing on exertion at baseline. She smokes ~3 packs/daily. She has given some differing reports of her home oxygen use, but it seems most likely that she is not usually on home oxygen. She says "people want me to be" when asked if she is on home oxygen, but also has housing instability and says she has not used oxygen at home recently. She denies cough or wheeze, has clear lungs on exam, and dyspnea is  limited to exertionand resolves quickly. Oxygen saturation 91-93 on 1 L of NC when examined upon admission.     Unlikely to be COPD exacerbationgiven no change in cough or sputum, no wheeze, patient is able to stand and breathe without oxygen while being examined. CHF was likely not contributing to dyspnea at presentation as the patient was looking euvolemic and was still on daily torsemide. However, the patient was getting a torsemide dose that was lower than her home dose as we were not able to get the group home med list until several days into her stay here. So she likely is now volume overloaded, contributing to dyspnea (see below for discussion).    Additional component of chronic dyspnea is likely chronic hypoventilation, supported by metabolic alkalosis (likely in setting of chronic respiratory acidosis) improvement with oxygen administration, with some additional component of functional limitation/deconditioning, as patient says she has had limited mobility due to pain.    Supplemental oxygen in patients with chronic hypoxemia has been shown to lengthen life expectancy, but as of now, the supplemental oxygen poses more significant short-term risks to the patient (self-harm via strangling self with oxygen tubing, inability to get sooner psych treatment as needing oxygen limits placement ability) that outweigh the long-term benefits of supplemental oxygen.     As this hypoxia is chronic, a further work-up could help to elucidate the contributing factors.    If within GOC/insurance capabilities, patient could undergo outpatient work-up for chronic hypoxemia to qualify for home supplemental O2. Work-up would include PFTs (to assess severity of COPD), V/Q scan (can assess for V/Q mismatch due to chronic PE, emphysema), measurement of DLCO (to assess for reduced DLCO as additional component of hypoxemia, especially given significant smoking history high risk for emphysema).    For now, patient has been off  of supplemental oxygen and is doing well. As this is likely her baseline, and short term risks outweigh long-term benefit, she is fine to be maintained without supplemental O2.     - Keep off oxygen   - Continue home symbicort BID   - Albuterol prn    # Suicidal Ideation, Risk  Patient with active SI, seen by psychiatry in emergency room, determined to be high risk. Active SI.Psych has been consulted and is following. Bed search in process, being temporarily held as patient now needs diuresis.  - Section 12 criteria met  - SI precautions, 1:1 sitter    #  Hx of HFpEF  # Chest heaviness/pain  Patient with history of CHF. Now looking volume overloaded on exam (bilateral LE edema, other markers hard to assess) and bedside POCUS yesterday showing distended JVP. As patient had been on a lower torsemide dose here than she was at home, increasing volume overload makes sense. Patient is now receiving IV lasix to try to achieve euvolemia so patient can be medically cleared again to resume psych bed search.    Patient reported some chest "heaviness" most likely related to the volume overload. EKG at the time of her pain was reassuring against ACS. PE unlikely, wells 0. The sensation has improved with lasix, further supporting the heaviness was related to volume overload. Having good response to 80 mg IV lasix but still overloaded on exam today 3/15. Needs further diuresis. Oxygen requirements have not changed.    - Now s/p 1 dose lasix 40 mg (3/13) and 2 doses lasix 80 mg (3/14) and 1 dose lasix 80 mg this morning (3/15)  - Check BMP at 11 to ensure patient without AKI prior to continuing diuresis   - Increase next dose to 120 mg IV today as patient still overloaded on exam, give at 1 pm pending normal BMP   - Hold home torsemide in favor of IV lasix for now  - Monitor for improvement of chest heaviness with diuresis  - Monitor troponin    # Chronic Pain  # Bipolar Disorder  # PTSD  Patient withbipolar disorder  andchronic pain syndrome,has significant pain at baseline. She is very concerned with the pain and says pain is worsening her depression. Knows she takes some home medications for pain but does not know which. Reports significant depression at this time, interrelated to pain. Also having PTSD symptoms including recurrent nightmares, and visual hallucinations of her dead son. These have been very disruptive to her sleep.  - Continue home gabapentin 600 mg  - Continue duloxetine 40 mg daily  - Continue doxepin 10 mg nightly  - Continue haldol 5 mg TID  - Added prazosin 1 mg for two nights (yesterday 3/14 and due tonight 3/15) then will increased to 2 mg nightly per psych recs for nightmares/PTSD  - Use Seroquel for 1st line and ativan 2nd line for prn for agitation   - Psych is consulted and following her  - Continue to engagement in empathetic listening and pursue non-pharmacologic methods of support like enabling patient to shower or brush her hair which she reports is calming to her    # Substance Use  Urine drug screen positive for cocaine, fentanyl upon presentation. Patient also on suboxone. Denies opioid use. Endorses marijuana, alcohol, cocaine use. History heavy alcohol use, but less active now per patient report.Low concern for withdrawal developing at this point given she's been without withdrawal symptoms on current medication regimen for a week.  - Continue suboxone 8-2 mg three times daily  - Continue acamprosate three times daily  - B12 was normal    # Gastritis  Patient has a history of gastritis. Denies any current abdominal pain, reflux symptoms.  - Continue home protonix 40 mg daily    # History of Seizures  Patient with remote history of seizures.   - Continue home oxcarbazapine 150 twice daily  - Continue home levetiracetam 750 mg twice daily    # History of Hypothyroidism  Patient with history of hypothyroidism. Controlled on home levo.  - Continue levothyroxine 25 mcg every morning    #  Constipation  - Continue home colace 100 mg daily prn    # Cardiac Risk factors  - Continue home atorvastatin 80 mg  - Continue home aspirin 81 mg    # Tobacco Use  Patient reports smoking up to 3 packs/day. Using nicotine gum and nicotine patch to good effect.  - Continue nicotine patch  - Continue nicotine gum     # Allergies  - Continue home Flonase  - Continue home Loratadine    # Polypharmacy  Patient on many medications and with many hospitalizations and discharges to different group homes/living facilities/unstable housing over several years. Important to assess necessity of each medication. Group home medication list was obtained on 3/13. Able to reconcile with her current meds and going forward will be maintained on the list consistent with group home list. Scanned into chart.    Diet:full    DVT Prophylaxis:subcutaneous heparin    Medication Reconciliation:Done with group home, donewith pharmacies to best ability (pt uses multiple pharmacies)    Was medication list from nursing facility collected & reviewed?n/a    Changes from home medication regimen: as above.    Code Status:Full Code    Health care proxy:Data UnavailableData Unavailable    Dispo:Inpatient PsychFacility once bed is available    Gearldine Bienenstock, [facility] MS3  [phone]    Discussed with resident Dr. Gwenlyn Perking and attending Dr. Perlie Mayo, MD.

## 2021-12-01 NOTE — Progress Notes (Signed)
Pt declined PT today, will f/u tomorrow.   Harlin Rain, PTA, Lic # [id number]

## 2021-12-01 NOTE — Plan of Care (Signed)
Alert  and oriented x two. Bed rest . Oob ad.lib . Ambulate to hallway. Ask for nicorette gum q 1 h . Sleepy and  drowsy after meal. ADL with assist, on !:! SI Watch continue , O sat Ok. Tolerated food well, awaiting for psy bed continue.

## 2021-12-01 NOTE — Plan of Care (Signed)
Problem: Tobacco/Nicotine Abuse  Goal: Risk control - tobacco abuse  Description: Actions to eliminate or reduce tobacco use.  Outcome: Progressing     Problem: Safety  Goal: Free from accidental physical injury  Outcome: Progressing     Problem: Pain  Goal: Patient's pain/discomfort is manageable  Description: Assess and monitor patient's pain using appropriate pain scale. Collaborate with interdisciplinary team and initiate plan and interventions as ordered. Re-assess patient's pain level 30 - 60 minutes after pain management intervention.   Outcome: Progressing     Pt alert to self and situation, confused on time and location. Pt forgetful. Calm and cooperative this shift. 1:1 sitter in place for SI. SBA in room with walker. Pt endorsing chronic generalized pain, medicated appropriately per mar with little effect. Frequently asks for nicotine gum. Vitals stable on RA. Pt reports shortness of breath upon exertion, relieved with rest. Pt able to make needs known.

## 2021-12-01 NOTE — Consults (Signed)
Pt continues to need medical care with volume overload. Will likely be medically cleared for admission to inpatient psych again as soon as tomorrow. Still has sitter. Still has thoughts of suicide.

## 2021-12-02 ENCOUNTER — Inpatient Hospital Stay
Admission: AD | Admit: 2021-12-02 | Discharge: 2021-12-05 | DRG: 885 | Disposition: A | Discharge status: Other Acute Inpatient Hospital | Payer: No Typology Code available for payment source | Attending: Psychiatry | Admitting: Psychiatry

## 2021-12-02 ENCOUNTER — Encounter (HOSPITAL_BASED_OUTPATIENT_CLINIC_OR_DEPARTMENT_OTHER): Payer: Self-pay | Admitting: Psychiatry - General

## 2021-12-02 DIAGNOSIS — K219 Gastro-esophageal reflux disease without esophagitis: Secondary | ICD-10-CM | POA: Diagnosis present

## 2021-12-02 DIAGNOSIS — F4321 Adjustment disorder with depressed mood: Secondary | ICD-10-CM | POA: Diagnosis not present

## 2021-12-02 DIAGNOSIS — M5430 Sciatica, unspecified side: Secondary | ICD-10-CM | POA: Diagnosis not present

## 2021-12-02 DIAGNOSIS — F4381 Prolonged grief disorder: Secondary | ICD-10-CM | POA: Diagnosis not present

## 2021-12-02 DIAGNOSIS — S8390XA Sprain of unspecified site of unspecified knee, initial encounter: Secondary | ICD-10-CM | POA: Diagnosis not present

## 2021-12-02 DIAGNOSIS — F1920 Other psychoactive substance dependence, uncomplicated: Secondary | ICD-10-CM

## 2021-12-02 DIAGNOSIS — G40909 Epilepsy, unspecified, not intractable, without status epilepticus: Secondary | ICD-10-CM | POA: Diagnosis present

## 2021-12-02 DIAGNOSIS — E785 Hyperlipidemia, unspecified: Secondary | ICD-10-CM | POA: Diagnosis not present

## 2021-12-02 DIAGNOSIS — E876 Hypokalemia: Secondary | ICD-10-CM | POA: Diagnosis present

## 2021-12-02 DIAGNOSIS — Z9151 Personal history of suicidal behavior: Secondary | ICD-10-CM | POA: Diagnosis not present

## 2021-12-02 DIAGNOSIS — I5030 Unspecified diastolic (congestive) heart failure: Secondary | ICD-10-CM | POA: Diagnosis not present

## 2021-12-02 DIAGNOSIS — I5033 Acute on chronic diastolic (congestive) heart failure: Secondary | ICD-10-CM | POA: Diagnosis not present

## 2021-12-02 DIAGNOSIS — Z720 Tobacco use: Secondary | ICD-10-CM

## 2021-12-02 DIAGNOSIS — E781 Pure hyperglyceridemia: Secondary | ICD-10-CM | POA: Diagnosis present

## 2021-12-02 DIAGNOSIS — M79606 Pain in leg, unspecified: Secondary | ICD-10-CM | POA: Diagnosis not present

## 2021-12-02 DIAGNOSIS — F419 Anxiety disorder, unspecified: Secondary | ICD-10-CM | POA: Diagnosis not present

## 2021-12-02 DIAGNOSIS — R600 Localized edema: Secondary | ICD-10-CM | POA: Diagnosis not present

## 2021-12-02 DIAGNOSIS — J449 Chronic obstructive pulmonary disease, unspecified: Secondary | ICD-10-CM | POA: Diagnosis present

## 2021-12-02 DIAGNOSIS — G4733 Obstructive sleep apnea (adult) (pediatric): Secondary | ICD-10-CM | POA: Diagnosis present

## 2021-12-02 DIAGNOSIS — Z59819 Housing instability, housed unspecified: Secondary | ICD-10-CM

## 2021-12-02 DIAGNOSIS — M48061 Spinal stenosis, lumbar 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 claudication: Secondary | ICD-10-CM | POA: Diagnosis present

## 2021-12-02 DIAGNOSIS — Z6841 Body Mass Index (BMI) 40.0 and over, adult: Secondary | ICD-10-CM

## 2021-12-02 DIAGNOSIS — S83272A Complex tear of lateral meniscus, current injury, left knee, initial encounter: Secondary | ICD-10-CM | POA: Diagnosis not present

## 2021-12-02 DIAGNOSIS — Y92231 Patient bathroom in hospital as the place of occurrence of the external cause: Secondary | ICD-10-CM | POA: Diagnosis not present

## 2021-12-02 DIAGNOSIS — M25461 Effusion, right knee: Secondary | ICD-10-CM | POA: Diagnosis not present

## 2021-12-02 DIAGNOSIS — R451 Restlessness and agitation: Secondary | ICD-10-CM | POA: Diagnosis not present

## 2021-12-02 DIAGNOSIS — I5040 Unspecified combined systolic (congestive) and diastolic (congestive) heart failure: Secondary | ICD-10-CM | POA: Diagnosis not present

## 2021-12-02 DIAGNOSIS — R45851 Suicidal ideations: Secondary | ICD-10-CM | POA: Diagnosis present

## 2021-12-02 DIAGNOSIS — M25562 Pain in left knee: Secondary | ICD-10-CM | POA: Diagnosis not present

## 2021-12-02 DIAGNOSIS — F1990 Other psychoactive substance use, unspecified, uncomplicated: Secondary | ICD-10-CM | POA: Diagnosis not present

## 2021-12-02 DIAGNOSIS — S83282A Other tear of lateral meniscus, current injury, left knee, initial encounter: Secondary | ICD-10-CM | POA: Diagnosis not present

## 2021-12-02 DIAGNOSIS — S83232A Complex tear of medial meniscus, current injury, left knee, initial encounter: Secondary | ICD-10-CM | POA: Diagnosis not present

## 2021-12-02 DIAGNOSIS — I5020 Unspecified systolic (congestive) heart failure: Secondary | ICD-10-CM

## 2021-12-02 DIAGNOSIS — S8992XA Unspecified injury of left lower leg, initial encounter: Secondary | ICD-10-CM | POA: Diagnosis not present

## 2021-12-02 DIAGNOSIS — W19XXXA Unspecified fall, initial encounter: Secondary | ICD-10-CM | POA: Diagnosis not present

## 2021-12-02 DIAGNOSIS — S0990XA Unspecified injury of head, initial encounter: Secondary | ICD-10-CM | POA: Diagnosis not present

## 2021-12-02 DIAGNOSIS — J45909 Unspecified asthma, uncomplicated: Secondary | ICD-10-CM | POA: Diagnosis not present

## 2021-12-02 DIAGNOSIS — I5022 Chronic systolic (congestive) heart failure: Secondary | ICD-10-CM | POA: Diagnosis present

## 2021-12-02 DIAGNOSIS — S83242A Other tear of medial meniscus, current injury, left knee, initial encounter: Secondary | ICD-10-CM | POA: Diagnosis not present

## 2021-12-02 DIAGNOSIS — F1910 Other psychoactive substance abuse, uncomplicated: Secondary | ICD-10-CM | POA: Diagnosis not present

## 2021-12-02 DIAGNOSIS — F251 Schizoaffective disorder, depressive type: Secondary | ICD-10-CM | POA: Diagnosis present

## 2021-12-02 DIAGNOSIS — F250 Schizoaffective disorder, bipolar type: Secondary | ICD-10-CM | POA: Diagnosis not present

## 2021-12-02 DIAGNOSIS — F32A Depression, unspecified: Secondary | ICD-10-CM

## 2021-12-02 DIAGNOSIS — I5031 Acute diastolic (congestive) heart failure: Secondary | ICD-10-CM | POA: Diagnosis not present

## 2021-12-02 DIAGNOSIS — S83262A Peripheral tear of lateral meniscus, current injury, left knee, initial encounter: Secondary | ICD-10-CM | POA: Diagnosis not present

## 2021-12-02 DIAGNOSIS — E669 Obesity, unspecified: Secondary | ICD-10-CM | POA: Diagnosis not present

## 2021-12-02 DIAGNOSIS — R0600 Dyspnea, unspecified: Secondary | ICD-10-CM | POA: Diagnosis not present

## 2021-12-02 DIAGNOSIS — R443 Hallucinations, unspecified: Secondary | ICD-10-CM | POA: Diagnosis not present

## 2021-12-02 DIAGNOSIS — F259 Schizoaffective disorder, unspecified: Secondary | ICD-10-CM | POA: Diagnosis not present

## 2021-12-02 DIAGNOSIS — F4310 Post-traumatic stress disorder, unspecified: Secondary | ICD-10-CM | POA: Diagnosis present

## 2021-12-02 DIAGNOSIS — G894 Chronic pain syndrome: Secondary | ICD-10-CM | POA: Diagnosis not present

## 2021-12-02 DIAGNOSIS — R0902 Hypoxemia: Secondary | ICD-10-CM | POA: Diagnosis not present

## 2021-12-02 DIAGNOSIS — R0602 Shortness of breath: Secondary | ICD-10-CM | POA: Diagnosis not present

## 2021-12-02 DIAGNOSIS — M5440 Lumbago with sciatica, unspecified side: Secondary | ICD-10-CM | POA: Diagnosis present

## 2021-12-02 HISTORY — DX: Chronic systolic (congestive) heart failure: I50.22

## 2021-12-02 LAB — BASIC METABOLIC PANEL
ANION GAP: 11 mmol/L (ref 10–22)
BUN (UREA NITROGEN): 21 mg/dL — ABNORMAL HIGH (ref 7–18)
CALCIUM: 9.7 mg/dL (ref 8.5–10.5)
CARBON DIOXIDE: 35 mmol/L — ABNORMAL HIGH (ref 21–32)
CHLORIDE: 95 mmol/L — ABNORMAL LOW (ref 98–107)
CREATININE: 0.8 mg/dL (ref 0.4–1.2)
ESTIMATED GLOMERULAR FILT RATE: >60 mL/min (ref >60)
Glucose Random: 144 mg/dL (ref 74–160)
POTASSIUM: 3.2 mmol/L — ABNORMAL LOW (ref 3.5–5.1)
SODIUM: 141 mmol/L (ref 136–145)

## 2021-12-02 LAB — MAGNESIUM: MAGNESIUM: 2 mg/dL (ref 1.6–2.6)

## 2021-12-02 LAB — VITAMIN B1, WHOLE BLOOD: VITAMIN B1, WHOLE BLOOD: 150.1 nmol/L (ref 66.5–200.0)

## 2021-12-02 LAB — HOLD PURPLE TOP TUBE

## 2021-12-02 LAB — EKG

## 2021-12-02 MED ORDER — DOXEPIN HCL 10 MG PO CAPS
10.0000 mg | ORAL_CAPSULE | Freq: Every evening | ORAL | Status: DC
Start: 2021-12-02 — End: 2021-12-03

## 2021-12-02 MED ORDER — BUPRENORPHINE HCL-NALOXONE HCL 8-2 MG SL SUBL
1.0000 | SUBLINGUAL_TABLET | Freq: Three times a day (TID) | SUBLINGUAL | Status: DC
Start: 2021-12-02 — End: 2021-12-17

## 2021-12-02 MED ORDER — NICOTINE 21 MG/24HR TD PT24
1.0000 | MEDICATED_PATCH | Freq: Every day | TRANSDERMAL | Status: DC
Start: 2021-12-02 — End: 2021-12-05
  Administered 2021-12-03 – 2021-12-05 (×3): 1 via TRANSDERMAL
  Filled 2021-12-02 (×3): qty 1

## 2021-12-02 MED ORDER — MELATONIN 3 MG PO TABS
3.0000 mg | ORAL_TABLET | Freq: Every evening | ORAL | Status: DC
Start: 2021-12-02 — End: 2021-12-05
  Administered 2021-12-02 – 2021-12-05 (×4): 3 mg via ORAL
  Filled 2021-12-02 (×4): qty 1

## 2021-12-02 MED ORDER — ASPIRIN 81 MG PO CHEW
81.0000 mg | CHEWABLE_TABLET | Freq: Every day | ORAL | Status: DC
Start: 2021-12-03 — End: 2021-12-17

## 2021-12-02 MED ORDER — THIAMINE 100 MG PO TABS
100.0000 mg | ORAL_TABLET | Freq: Every day | ORAL | Status: DC
Start: 2021-12-03 — End: 2021-12-05
  Administered 2021-12-03 – 2021-12-05 (×3): 100 mg via ORAL
  Filled 2021-12-02 (×3): qty 1

## 2021-12-02 MED ORDER — DOXEPIN HCL 10 MG PO CAPS
10.0000 mg | ORAL_CAPSULE | Freq: Every evening | ORAL | Status: DC
Start: 2021-12-02 — End: 2021-12-03
  Administered 2021-12-02: 10 mg via ORAL
  Filled 2021-12-02: qty 1

## 2021-12-02 MED ORDER — TORSEMIDE 20 MG PO TABS
60.0000 mg | ORAL_TABLET | Freq: Two times a day (BID) | ORAL | Status: DC
Start: 2021-12-02 — End: 2021-12-03
  Administered 2021-12-02 – 2021-12-03 (×2): 60 mg via ORAL
  Filled 2021-12-02 (×2): qty 3

## 2021-12-02 MED ORDER — ASPIRIN 81 MG PO CHEW
81.0000 mg | CHEWABLE_TABLET | Freq: Every day | ORAL | Status: DC
Start: 2021-12-03 — End: 2021-12-05
  Administered 2021-12-03 – 2021-12-05 (×3): 81 mg via ORAL
  Filled 2021-12-02 (×3): qty 1

## 2021-12-02 MED ORDER — FERROUS SULFATE 324 (65 FE) MG PO TBEC
324.0000 mg | DELAYED_RELEASE_TABLET | ORAL | Status: DC
Start: 2021-12-03 — End: 2021-12-17

## 2021-12-02 MED ORDER — DULOXETINE HCL 20 MG PO CPEP
40.0000 mg | ORAL_CAPSULE | Freq: Every day | ORAL | Status: DC
Start: 2021-12-03 — End: 2021-12-03
  Administered 2021-12-03: 40 mg via ORAL
  Filled 2021-12-02: qty 2

## 2021-12-02 MED ORDER — DOCUSATE SODIUM 100 MG PO CAPS
100.0000 mg | ORAL_CAPSULE | Freq: Every day | ORAL | Status: DC | PRN
Start: 2021-12-02 — End: 2021-12-04

## 2021-12-02 MED ORDER — LEVETIRACETAM 500 MG PO TABS
750.0000 mg | ORAL_TABLET | Freq: Two times a day (BID) | ORAL | Status: DC
Start: 2021-12-02 — End: 2021-12-05
  Administered 2021-12-02 – 2021-12-05 (×7): 750 mg via ORAL
  Filled 2021-12-02 (×7): qty 1

## 2021-12-02 MED ORDER — NYSTATIN 100000 UNIT/GM EX POWD
Freq: Two times a day (BID) | CUTANEOUS | Status: DC
Start: 2021-12-02 — End: 2021-12-02
  Filled 2021-12-02: qty 30

## 2021-12-02 MED ORDER — NYSTATIN 100000 UNIT/GM EX POWD
Freq: Two times a day (BID) | CUTANEOUS | Status: DC
Start: 2021-12-02 — End: 2021-12-02

## 2021-12-02 MED ORDER — ACAMPROSATE CALCIUM 333 MG PO TBEC
666.0000 mg | DELAYED_RELEASE_TABLET | Freq: Three times a day (TID) | ORAL | Status: DC
Start: 2021-12-02 — End: 2021-12-05
  Administered 2021-12-02 – 2021-12-05 (×10): 666 mg via ORAL
  Filled 2021-12-02 (×10): qty 2

## 2021-12-02 MED ORDER — TORSEMIDE 20 MG PO TABS
60.0000 mg | ORAL_TABLET | Freq: Two times a day (BID) | ORAL | Status: DC
Start: 2021-12-02 — End: 2021-12-17

## 2021-12-02 MED ORDER — POTASSIUM CHLORIDE CRYS ER 20 MEQ PO TBCR
40.00 meq | EXTENDED_RELEASE_TABLET | Freq: Once | ORAL | Status: AC
Start: 2021-12-02 — End: 2021-12-02
  Administered 2021-12-02: 40 meq via ORAL
  Filled 2021-12-02: qty 2

## 2021-12-02 MED ORDER — FUROSEMIDE 10 MG/ML IJ SOLN
120.0000 mg | Freq: Once | INTRAMUSCULAR | Status: DC
Start: 2021-12-02 — End: 2021-12-02

## 2021-12-02 MED ORDER — HALOPERIDOL 5 MG PO TABS
5.0000 mg | ORAL_TABLET | Freq: Three times a day (TID) | ORAL | Status: DC
Start: 2021-12-02 — End: 2021-12-03
  Administered 2021-12-02 – 2021-12-03 (×2): 5 mg via ORAL
  Filled 2021-12-02 (×2): qty 1

## 2021-12-02 MED ORDER — ACETAMINOPHEN 500 MG PO TABS
1000.00 mg | ORAL_TABLET | Freq: Once | ORAL | Status: AC
Start: 2021-12-02 — End: 2021-12-02
  Administered 2021-12-02: 1000 mg via ORAL
  Filled 2021-12-02: qty 2

## 2021-12-02 MED ORDER — DICLOFENAC SODIUM 1 % EX GEL
4.0000 g | Freq: Four times a day (QID) | CUTANEOUS | Status: DC | PRN
Start: 2021-12-02 — End: 2021-12-05
  Filled 2021-12-02: qty 50

## 2021-12-02 MED ORDER — NAPROXEN 250 MG PO TABS
250.0000 mg | ORAL_TABLET | Freq: Two times a day (BID) | ORAL | Status: DC | PRN
Start: 2021-12-02 — End: 2021-12-17

## 2021-12-02 MED ORDER — MELATONIN 3 MG PO TABS
3.0000 mg | ORAL_TABLET | Freq: Every evening | ORAL | Status: DC
Start: 2021-12-02 — End: 2021-12-17

## 2021-12-02 MED ORDER — CALCIUM CARBONATE ANTACID 500 MG PO CHEW
500.0000 mg | CHEWABLE_TABLET | Freq: Once | ORAL | Status: DC
Start: 2021-12-02 — End: 2021-12-02

## 2021-12-02 MED ORDER — LORAZEPAM 1 MG PO TABS
1.0000 mg | ORAL_TABLET | Freq: Three times a day (TID) | ORAL | Status: DC | PRN
Start: 2021-12-02 — End: 2021-12-03
  Administered 2021-12-02: 1 mg via ORAL
  Filled 2021-12-02: qty 1

## 2021-12-02 MED ORDER — ALUMINUM-MAGNESIUM-SIMETHICONE 200-200-20 MG/5ML PO SUSP
30.0000 mL | ORAL | Status: DC | PRN
Start: 2021-12-02 — End: 2021-12-05

## 2021-12-02 MED ORDER — PANTOPRAZOLE SODIUM 40 MG PO TBEC
40.0000 mg | DELAYED_RELEASE_TABLET | Freq: Every day | ORAL | Status: DC
Start: 2021-12-03 — End: 2021-12-05
  Administered 2021-12-03 – 2021-12-05 (×3): 40 mg via ORAL
  Filled 2021-12-02 (×3): qty 1

## 2021-12-02 MED ORDER — SENNOSIDES 8.6 MG PO TABS
17.2000 mg | ORAL_TABLET | Freq: Every evening | ORAL | Status: DC | PRN
Start: 2021-12-02 — End: 2021-12-04

## 2021-12-02 MED ORDER — NAPROXEN 250 MG PO TABS
250.0000 mg | ORAL_TABLET | Freq: Two times a day (BID) | ORAL | Status: DC | PRN
Start: 2021-12-02 — End: 2021-12-02
  Administered 2021-12-02: 250 mg via ORAL

## 2021-12-02 MED ORDER — ATORVASTATIN CALCIUM 40 MG PO TABS
80.0000 mg | ORAL_TABLET | Freq: Every evening | ORAL | Status: DC
Start: 2021-12-02 — End: 2021-12-05
  Administered 2021-12-02 – 2021-12-05 (×4): 80 mg via ORAL
  Filled 2021-12-02 (×4): qty 2

## 2021-12-02 MED ORDER — METHOCARBAMOL 500 MG PO TABS
750.0000 mg | ORAL_TABLET | Freq: Three times a day (TID) | ORAL | Status: DC
Start: 2021-12-03 — End: 2021-12-05
  Administered 2021-12-03 – 2021-12-05 (×9): 750 mg via ORAL
  Filled 2021-12-02 (×9): qty 2

## 2021-12-02 MED ORDER — TORSEMIDE 20 MG PO TABS
60.00 mg | ORAL_TABLET | Freq: Once | ORAL | Status: AC
Start: 2021-12-02 — End: 2021-12-02
  Administered 2021-12-02: 60 mg via ORAL
  Filled 2021-12-02: qty 3

## 2021-12-02 MED ORDER — POTASSIUM CHLORIDE CRYS ER 20 MEQ PO TBCR
20.0000 meq | EXTENDED_RELEASE_TABLET | Freq: Two times a day (BID) | ORAL | Status: DC
Start: 2021-12-02 — End: 2021-12-05
  Administered 2021-12-02 – 2021-12-05 (×7): 20 meq via ORAL
  Filled 2021-12-02 (×7): qty 1

## 2021-12-02 MED ORDER — SENNOSIDES 8.6 MG PO TABS
17.2000 mg | ORAL_TABLET | Freq: Every evening | ORAL | Status: DC | PRN
Start: 2021-12-02 — End: 2021-12-17

## 2021-12-02 MED ORDER — LEVOTHYROXINE SODIUM 25 MCG PO TABS
25.0000 ug | ORAL_TABLET | Freq: Every morning | ORAL | Status: DC
Start: 2021-12-03 — End: 2021-12-05
  Administered 2021-12-03 – 2021-12-05 (×3): 25 ug via ORAL
  Filled 2021-12-02 (×3): qty 1

## 2021-12-02 MED ORDER — BUDESONIDE-FORMOTEROL FUMARATE 160-4.5 MCG/ACT IN AERO
2.0000 | INHALATION_SPRAY | Freq: Two times a day (BID) | RESPIRATORY_TRACT | Status: DC
Start: 2021-12-02 — End: 2021-12-05
  Administered 2021-12-02 – 2021-12-05 (×7): 2 via RESPIRATORY_TRACT
  Filled 2021-12-02: qty 6

## 2021-12-02 MED ORDER — GABAPENTIN 300 MG PO CAPS
600.0000 mg | ORAL_CAPSULE | Freq: Three times a day (TID) | ORAL | Status: DC
Start: 2021-12-02 — End: 2021-12-05
  Administered 2021-12-02 – 2021-12-05 (×10): 600 mg via ORAL
  Filled 2021-12-02 (×10): qty 2

## 2021-12-02 MED ORDER — EUCERIN EX CREA
TOPICAL_CREAM | Freq: Two times a day (BID) | CUTANEOUS | Status: DC | PRN
Start: 2021-12-02 — End: 2021-12-05
  Filled 2021-12-02: qty 113

## 2021-12-02 MED ORDER — BUPRENORPHINE HCL-NALOXONE HCL 8-2 MG SL SUBL
8.0000 mg | SUBLINGUAL_TABLET | Freq: Three times a day (TID) | SUBLINGUAL | Status: DC
Start: 2021-12-02 — End: 2021-12-05
  Administered 2021-12-02 – 2021-12-05 (×10): 1 via SUBLINGUAL
  Filled 2021-12-02 (×10): qty 1

## 2021-12-02 MED ORDER — POTASSIUM CHLORIDE CRYS ER 20 MEQ PO TBCR
40.0000 meq | EXTENDED_RELEASE_TABLET | Freq: Once | ORAL | Status: AC
Start: 2021-12-02 — End: 2021-12-02
  Administered 2021-12-02: 40 meq via ORAL
  Filled 2021-12-02: qty 2

## 2021-12-02 MED ORDER — FERROUS GLUCONATE 324 (38 FE) MG PO TABS
324.0000 mg | ORAL_TABLET | ORAL | Status: DC
Start: 2021-12-03 — End: 2021-12-05
  Administered 2021-12-03: 324 mg via ORAL
  Filled 2021-12-02: qty 1

## 2021-12-02 MED ORDER — PRAZOSIN HCL 1 MG PO CAPS
2.0000 mg | ORAL_CAPSULE | Freq: Every evening | ORAL | Status: DC
Start: 2021-12-02 — End: 2021-12-02

## 2021-12-02 MED ORDER — POTASSIUM CHLORIDE CRYS ER 20 MEQ PO TBCR
40.0000 meq | EXTENDED_RELEASE_TABLET | Freq: Once | ORAL | Status: AC
Start: 2021-12-02 — End: 2021-12-02
  Administered 2021-12-02: 40 meq via ORAL
  Filled 2021-12-02 (×2): qty 2

## 2021-12-02 MED ORDER — FLUTICASONE PROPIONATE 50 MCG/ACT NA SUSP
1.0000 | Freq: Two times a day (BID) | NASAL | Status: DC
Start: 2021-12-02 — End: 2021-12-05
  Administered 2021-12-02 – 2021-12-05 (×7): 1 via NASAL
  Filled 2021-12-02: qty 16

## 2021-12-02 MED ORDER — HALOPERIDOL 5 MG PO TABS
5.0000 mg | ORAL_TABLET | Freq: Three times a day (TID) | ORAL | Status: DC
Start: 2021-12-02 — End: 2021-12-17

## 2021-12-02 MED ORDER — NICOTINE POLACRILEX 2 MG MT GUM
2.0000 mg | CHEWING_GUM | OROMUCOSAL | Status: DC | PRN
Start: 2021-12-02 — End: 2021-12-05
  Administered 2021-12-02 – 2021-12-05 (×9): 2 mg via ORAL
  Filled 2021-12-02 (×13): qty 1

## 2021-12-02 MED ORDER — DICLOFENAC SODIUM 1 % EX GEL
4.0000 g | Freq: Four times a day (QID) | CUTANEOUS | 0 refills | Status: DC | PRN
Start: 2021-12-02 — End: 2021-12-17

## 2021-12-02 MED ORDER — NAPROXEN 250 MG PO TABS
250.0000 mg | ORAL_TABLET | Freq: Two times a day (BID) | ORAL | Status: DC | PRN
Start: 2021-12-02 — End: 2021-12-05
  Administered 2021-12-02 – 2021-12-05 (×5): 250 mg via ORAL
  Filled 2021-12-02 (×5): qty 1

## 2021-12-02 MED ORDER — ACETAMINOPHEN 325 MG PO TABS
650.0000 mg | ORAL_TABLET | ORAL | Status: DC | PRN
Start: 2021-12-02 — End: 2021-12-05
  Administered 2021-12-02: 650 mg via ORAL
  Filled 2021-12-02 (×2): qty 2

## 2021-12-02 MED ORDER — PRAZOSIN HCL 1 MG PO CAPS
2.0000 mg | ORAL_CAPSULE | Freq: Every evening | ORAL | Status: DC
Start: 2021-12-02 — End: 2021-12-05
  Administered 2021-12-02 – 2021-12-05 (×4): 2 mg via ORAL
  Filled 2021-12-02 (×4): qty 2

## 2021-12-02 MED ORDER — QUETIAPINE FUMARATE 50 MG PO TABS
50.0000 mg | ORAL_TABLET | Freq: Four times a day (QID) | ORAL | Status: DC | PRN
Start: 2021-12-02 — End: 2021-12-03

## 2021-12-02 MED ORDER — DULOXETINE HCL 40 MG PO CPEP
40.0000 mg | ORAL_CAPSULE | Freq: Every day | ORAL | Status: DC
Start: 2021-12-03 — End: 2021-12-17

## 2021-12-02 MED ORDER — NICOTINE POLACRILEX 2 MG MT GUM
2.0000 mg | CHEWING_GUM | OROMUCOSAL | Status: DC | PRN
Start: 2021-12-02 — End: 2021-12-17

## 2021-12-02 MED ORDER — BUDESONIDE-FORMOTEROL FUMARATE 160-4.5 MCG/ACT IN AERO
2.0000 | INHALATION_SPRAY | Freq: Two times a day (BID) | RESPIRATORY_TRACT | 0 refills | Status: DC
Start: 2021-12-02 — End: 2021-12-17

## 2021-12-02 MED ORDER — MAGNESIUM HYDROXIDE 400 MG/5ML PO SUSP
30.0000 mL | Freq: Every day | ORAL | Status: DC | PRN
Start: 2021-12-02 — End: 2021-12-05

## 2021-12-02 MED ORDER — NYSTATIN 100000 UNIT/GM EX POWD
Freq: Two times a day (BID) | CUTANEOUS | Status: DC | PRN
Start: 2021-12-02 — End: 2021-12-05
  Filled 2021-12-02: qty 30

## 2021-12-02 MED ORDER — PRAZOSIN HCL 2 MG PO CAPS
2.0000 mg | ORAL_CAPSULE | Freq: Every evening | ORAL | Status: DC
Start: 2021-12-02 — End: 2021-12-17

## 2021-12-02 MED ORDER — QUETIAPINE FUMARATE 50 MG PO TABS
50.0000 mg | ORAL_TABLET | Freq: Four times a day (QID) | ORAL | Status: DC | PRN
Start: 2021-12-02 — End: 2021-12-17

## 2021-12-02 MED ORDER — LORAZEPAM 1 MG PO TABS
1.0000 mg | ORAL_TABLET | Freq: Two times a day (BID) | ORAL | Status: DC | PRN
Start: 2021-12-02 — End: 2021-12-17

## 2021-12-02 MED ORDER — ALBUTEROL SULFATE HFA 108 (90 BASE) MCG/ACT IN AERS
2.0000 | INHALATION_SPRAY | RESPIRATORY_TRACT | Status: DC | PRN
Start: 2021-12-02 — End: 2021-12-05
  Administered 2021-12-04: 2 via RESPIRATORY_TRACT
  Filled 2021-12-02: qty 8

## 2021-12-02 MED ORDER — NICOTINE 21 MG/24HR TD PT24
1.0000 | MEDICATED_PATCH | Freq: Every day | TRANSDERMAL | Status: DC
Start: 2021-12-03 — End: 2021-12-17

## 2021-12-02 MED ORDER — OXCARBAZEPINE 150 MG PO TABS
150.0000 mg | ORAL_TABLET | Freq: Two times a day (BID) | ORAL | Status: DC
Start: 2021-12-02 — End: 2021-12-05
  Administered 2021-12-02 – 2021-12-05 (×7): 150 mg via ORAL
  Filled 2021-12-02 (×7): qty 1

## 2021-12-02 NOTE — Discharge Summary (Deleted)
Physician Discharge Summary     Patient ID:  Name Cheryl Zimmerman  MRN [id number]   Age 51 year old  DOB 1971/09/14    Admit Date: 11/26/21    Discharge Date: 12/02/21  Admitting Physician: Enos Fling, MD  Discharge Physicians   Intern: Gearldine Bienenstock (sub-Intern)   Resident: Gwenlyn Perking, MD   Attending: Nada Boozer, MD     Discharge Diagnoses: Suicidal Ideation, acute on chronic HFpEF    Issues for Follow up:     [ ]  Suicidal Ideation  Patient with active SI inpatient.     [ ]  Polypharmacy  Patient with a complicated medication list. She has had many different hospitalizations at different places, discharged to various group homes. Has been using multiple different pharmacies, likely related to housing instability, no consistent providers. Medications from her group home prior to this admission have been scanned into her chart. Important to evaluate the necessity of the medications, particularly psychiatric medications.    Prior discharge summary today was signed before final discharge medications list. This is an updated discharge summary with final discharge medications.    [ ]  Housing Instability  Patient with many years of housing instability. Most recently lived in a group home, was living in a respite house prior, and prior to that was living on the streets in [place]. She is understandably very upset and anxious about this situation and where she will live after this hospitalization. She reports she does not feel safe being discharged back to the same group home where she was before.     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 She was seen in the ED by psychiatry and was deemed to require inpatient psychiatry hospitalization.    While boarding in the ED, on 3/7, the patient reported "having a hard time breathing." At that time, she had an oxygen saturation 88-90 on room air. She was given a duo-neb and a spot dose of lasix. She was then put on 4L NC and has been on supplemental O2, now weaned to 2L, since.     She was having active visual hallucinations, reports seeing her dead son while in the ED. Reported to psychiatry that she is thinking actively of ways to take her own life while inpatient in the hospital. Denies HI.    Medicine bed was requested given an inpatient psychiatry bed will likely not be available for several days, and the patient is not safe to discharge in the meantime.    However, upon further evaluation, question was raised about whether or not the patient required continuous oxygen, as this was posing an issue both for psychiatric bed placement and for the patient's safety, as she has thought about and tried to strangle herself with the oxygen tubing.    Thus, she was admitted for further investigation of her oxygen needs while awaiting psychiatric placement.     Hospital Course:    While on the inpatient medicine floor, the patient was trialed off of  the supplemental oxygen and was found to have oxygen saturation of 88% which improved to 93% after IV diuresis. Her oxygen saturation makes sense for a patient with longstanding COPD, significant smoking history, CHF, and chronic hypoventilation. She had transient dyspnea, without concurrent oxygen desaturation, which improved after IV diuresis. She remained independent in her ADLs throughout her admission.    # acute on chronic HFpEF  # Difficulty  breathing  Patient has COPD and has difficulty breathing on exertion at baseline. She smokes ~3 packs/daily. She is not usually on home oxygen. On admission, she denied cough or wheeze, had clear lungs on exam, and dyspnea was limited to exertionand resolved quickly. Oxygen saturation 91-93 on 1 L of NC when examined upon admission.     On admission, dyspnea was unlikely to be COPD exacerbationgiven no change in cough or sputum, no wheeze, patient was asymptomatic without supplemental oxygen. CHF was likely not contributing to dyspnea at time of admission as the patient was looking euvolemic and was still on daily torsemide. However, it was later found that the patient was getting a torsemide dose that was lower than her home dose, as we were not able to get the group home med list until several days into her stay here. She had then become volume overloaded, evident on exam and with symptomatic complaints of dyspnea (further discussion below). These symptoms have improved with several days of diuresis. The patient never required supplemental oxygen during her admission, even with the feeling of subjective dyspnea prior to IV diuresis.    An additional component of her chronic dyspnea is likely chronic hypoventilation, supported by metabolic alkalosis upon presentation(likely in setting of chronic respiratory acidosis). Also, she likely has some additional component of functional limitation/deconditioning, as patient says she has had limited mobility due to pain.    Supplemental oxygen in patients with chronic hypoxemia has been shown to lengthen life expectancy, but as of now, the supplemental oxygen poses more significant short-term risks to the patient (self-harm via strangling self with oxygen tubing, inability to get sooner psych treatment as needing oxygen limits placement ability) that outweigh the long-term benefits of supplemental oxygen.     For now, patient continues to have satisfactory oxygen  saturation without supplemental oxygen and is doing well.    # Suicidal Ideation, Risk  Patient deemed to be high risk given active suicidal ideation, attempt to harm herself via strangling with the oxygen monitors while in the ED. She was seen by psychiatry. Met section 12 criteria. She was maintained with a 1:1 sitter with strict SI precautions while inpatient. Psych saw her while inpatient and determined she continued to need an inpatient level of psychiatry care and continued to meet section 12 criteria, with requirement for 1:1 sitter.    # Chronic Pain  # Bipolar Disorder  Patient with bipolar disorder and chronic pain syndrome, has significant pain at baseline. She says pain is worsening her depression. Knows she takes some home medications for pain but does not know which. Reports significant depression at this time, interrelated to pain. We continued her home gabapentin 600 mg; duloxetine 40 mg daily; doxepin 10 mg nightly; haldol 5 mg TID. We added naproxen prn for pain. We used seroquel 50 mg and ativan 1 mg as needed for significant anxiety and agitation. Patient also responded well to verbal redirection and non-pharmacologic interventions such as showering, hair brushing, gown changing, and using a paper and pencil to write.    # Substance Use  Urine  drug screen was positive for cocaine, fentanyl upon presentation. Patient also on suboxone. Denies opioid use. Endorses marijuana, alcohol, cocaine use. There was low concern for withdrawal upon admission to the inpatient floor as she was asymptomatic from a withdrawal perspective on current medication regimen while in ED for 3-4 days. We ontinued suboxone 8-2 mg three times daily; acamprosate three times daily.    # Hx of HFpEF  Patient with history of CHF. The patient did develop volume overload on exam on 3/14 with some associated worsening in subjective breathing. Patient never required supplemental oxygen. We gave her IV diuresis after which her  subjective dyspnea improved. She was transitioned back to PO torsemide on 3/16. We changed her dose to 80 mg torsemide BID.    # Gastritis  Patient has a history of gastritis. Denies any current abdominal pain, reflux symptoms. We continued home protonix 40 mg daily.    # History of Seizures  Patient with remote history of seizures. We continued home oxcarbazapine 150 twice daily; home levetiracetam 750 mg twice daily.    # History of Hypothyroidism  Patient with history of hypothyroidism. Controlled on home levothyroxine 25 mcg, which we continued.    # Constipation  Has some chronic issues with constipation. Denies any constipation at this time. We continued home colace 100 mg daily as needed.    # Cardiac Risk factors  Given significant cardiovascular risk factors (smoking, hyperlipidemia, obesity) we continued home atorvastatin 80 mg; home aspirin 81 mg.    # Tobacco Use  Patient reports smoking up to 3 packs/day. Used nicotine gum and nicotine patch to good effect while inpatient.    # Allergies  Patient with some baseline allergies. We continued home Flonase; home loratadine.    # Polypharmacy  Patient on many medications and with many hospitalizations and discharges to different group homes/living facilities/unstable housing over several years. We did our best to reconcile her medication list. ED provider was able to get the med list from the group home, primary team was unable to contact them.    Key labs, imaging, other tests:       Chemistries:  Recent Labs     11/30/21  0712 11/30/21  1455 12/02/21  0739   NA 142 143 141   K 3.5 3.7 3.2*   CO2 34* 27 35*   BUN 22* 21* 21*   CREAT 0.8 0.9 0.8   CA 9.4 9.2 9.7   MG  --  1.9 2.0   PHOS  --  3.9  --    ANION 11 19 11    GFR > 60 > 60 > 60    GI:  No results for input(s): AST, ALT, TBILI, DBILI, IBIL, ALKPHOS, GGTP, ALBUMIN, LIP in the last 72 hours.   CBC:  No results for input(s): WBC, HGB, HCT, PLTA in the last 72 hours. Coags:   No results for  input(s): INR, PT, APTT, HPTT, FIB in the last 72 hours.   Trops, BNP, D-dimer, Lactate, C-RP, Procal, CK:  Recent Labs     11/30/21  0930   TROPTHS 17*   PROBNP 42       Finger Sticks:  No results for input(s): FINGERSTICKR in the last 72 hours. Urinalysis:  No results for input(s): UACOL, UACLA, UAGLU, UABIL, UAKET, SPEGRAVURINE, UAOCC, UAPH, UAPRO, UABAC, UARBC, UANIT, LEUKOCYTES, UABAC, UASQE in the last 72 hours.    Invalid input(s): UAOBU,  UAWBC,  UAMIC       XR Chest  Portable    Result Date: 11/24/2021  TECHNIQUE: Portable chest, 2:52 PM and repeat at 2:54 PM INDICATION: Hypoxia COMPARISON: 05/31/2011 FINDINGS: Quality: Satisfactory.   Tubes/lines: None. Lungs: There is a linear density consistent with atelectasis in the left base. There is overlying breast tissue obscuring a portion of the right base. No definite infiltrates are seen. Pleura: There is no pleural effusion or pneumothorax. Heart: The cardiac silhouette is unremarkable. Mediastinum/hila: Unremarkable. Bones and Soft Tissues: There are degenerative changes and scoliosis of the thoracic spine.   IMPRESSION: Bibasilar atelectasis, otherwise no acute cardiopulmonary findings.   Reviewed and Electronically Signed by: Penni Homans MD Signed Date/Time: 11-24-2021 15:11:59         Pending Laboratory Work and Tests: n/a    Discharge Exam:  BP 100/64    Pulse 76    Temp 98 F (36.7 C) (Oral)    Resp 18    Wt (!) 150.1 kg (331 lb)    SpO2 94%   NID:POEUM in bed, watching tv.  HEENT: PERRLA, MMM.  CV: RRR. Normal S1, S2. No m/r/g.  Lungs:Normal work of breathing. Clear to auscultation bilaterally, no wheezes, crackles.  Abd: NABS. Soft, NTND.  Ext: Warm.  Edema of the legs to the knees.  Neuro: A&Ox4. CN 2-12 grossly intact.Moving all limbs spontaneously.  Skin: No rashes.  T/L/D:Left peripheral IV.    Discharge Medication List:    Current Discharge Medication List    START taking these medications    aspirin 81 MG chewable tablet  Take 1 tablet  by mouth in the morning.    doxepin (SINEQUAN) 10 MG capsule  Take 1 capsule by mouth nightly    ferrous sulfate 324 (65 Fe) MG TBEC EC tablet  Take 1 tablet by mouth every other day    LORazepam (ATIVAN) 1 MG tablet  Take 1 tablet by mouth 2 (two) times daily as needed    melatonin 3 MG TABS tablet  Take 1 tablet by mouth nightly    naproxen (NAPROSYN) 250 MG tablet  Take 1 tablet by mouth 2 (two) times daily as needed for Pain    nicotine (NICODERM CQ) 21 MG/24HR  Place 1 patch onto the skin in the morning.    nicotine polacrilex (NICORETTE) 2 MG gum  Take 1 each by mouth every hour as needed for Craving (Nicotine)    prazosin (MINIPRESS) 2 MG capsule  Take 1 capsule by mouth nightly    sennosides (SENOKOT) 8.6 MG tablet  Take 2 tablets by mouth nightly as needed for Constipation (secondline)      CONTINUE these medications which have CHANGED    budesonide-formoterol (SYMBICORT) 160-4.5 MCG/ACT inhaler  Inhale 2 puffs into the lungs in the morning and 2 puffs before bedtime.  Qty: 1 each Refills: 0    buprenorphine-naloxone (SUBOXONE) 8-2 MG sublingual tablet  Place 1 tablet under the tongue in the morning and 1 tablet at noon and 1 tablet before bedtime. Max Daily Amount: 3 tablets.    diclofenac (VOLTAREN) 1 % GEL Gel  Apply 4 g topically 4 (four) times daily as needed (knee pain)  Qty: 100 g Refills: 0    DULoxetine 40 MG CPEP  Take 40 mg by mouth daily    haloperidol (HALDOL) 5 MG tablet  Take 1 tablet by mouth in the morning and 1 tablet at noon and 1 tablet before bedtime.    QUEtiapine (SEROQUEL) 50 MG tablet  Take 1 tablet by mouth every 6 (six) hours  as needed    torsemide (DEMADEX) 20 MG tablet  Take 3 tablets by mouth in the morning and 3 tablets before bedtime.      CONTINUE these medications which have NOT CHANGED    acamprosate (CAMPRAL) 333 MG tablet  Take 666 mg by mouth in the morning and 666 mg at noon and 666 mg before bedtime.    methocarbamol (ROBAXIN) 750 MG tablet  Take 750 mg by mouth in  the morning and 750 mg at noon and 750 mg in the evening. Take before meals.    fluticasone (FLONASE) 50 MCG/ACT nasal spray  1 spray by Each Nostril route 2 (two) times daily    ferrous gluconate (FERGON) 324 MG tablet  Take 324 mg by mouth 3 (three) times a week Monday, Wednesday, Friday    atorvastatin (LIPITOR) 80 MG tablet  Take 80 mg by mouth nightly    gabapentin (NEURONTIN) 300 MG capsule  Take 600 mg by mouth in the morning and 600 mg at noon and 600 mg before bedtime.    docusate sodium (COLACE) 100 MG capsule  Take 100 mg by mouth daily as needed for Constipation    EPINEPHrine (EPIPEN JR 1:2000) 0.15 MG/0.15ML injection  Inject 0.15 mg into the muscle as needed    nystatin (MYCOSTATIN) cream  Apply topically 2 (two) times daily as needed (Rash, redness)    nystatin (MYCOSTATIN) powder  Apply topically 2 (two) times daily as needed (Rash, redness)    albuterol HFA 108 (90 Base) MCG/ACT inhaler  Inhale 2 puffs into the lungs every 4 (four) hours as needed for Wheezing or Shortness of breath    ondansetron (ZOFRAN-ODT) 4 MG disintegrating tablet  Take 4 mg by mouth every 8 (eight) hours as needed for Nausea    levothyroxine (SYNTHROID) 25 MCG tablet  Take 25 mcg by mouth every morning before breakfast    loratadine (CLARITIN) 10 MG tablet  Take 10 mg by mouth in the morning.    omeprazole (PRILOSEC) 40 MG capsule  Take 40 mg by mouth in the morning.    OXcarbazepine (TRILEPTAL) 150 MG tablet  Take 150 mg by mouth in the morning and 150 mg before bedtime.    potassium chloride SA (K-DUR) 20 MEQ tablet  Take 20 mEq by mouth in the morning and 20 mEq before bedtime.    levETIRAcetam (KEPPRA) 750 MG tablet  Take 750 mg by mouth in the morning and 750 mg before bedtime.    thiamine (VITAMIN B-1) 100 MG tablet  Take 1 tablet by mouth daily.  Qty: 30 tablet Refills: 12      STOP taking these medications    doxepin (SINEQUAN) 10 MG/ML solution  Comments:  Reason for Stopping:    potassium chloride (KLOR-CON) 20 MEQ  packet  Comments:  Reason for Stopping:    aspirin 81 MG EC tablet  Comments:  Reason for Stopping:    ibuprofen (ADVIL,MOTRIN) 400 MG tablet  Comments:  Reason for Stopping:                Code Status at Admission: Full Code  Code Status at Discharge: [place] Proxy and Phone Number: Data Unavailable  Data Unavailable   Diet: Regular  Wound Care: N/a  Discharge Destination: Psychiatric Facility     Future appointments:   Current and Future Appointments at [facility] [phone] Days) 12/02/2021 - 03/02/2022    None        Follow-up Information  None         To reach medical records at any time call the [facility] operator at [phone]     To reach the covering hospitalist, page [phone]  To reach the [facility] for pending results, call [phone]    Signed:  Gearldine Bienenstock, 12/02/2021     Discussed with Resident Gwenlyn Perking, MD, and attending, Dr. Perlie Mayo, MD.     On day of discharge, I spent greater than 30 minutes face-to-face with this patient and unit time, of which greater than 50% of that time was spent counseling/coordinating care including reviewing the chart, meeting with the treatment team, meeting with family/caretakers, patient/family education and discussing diagnosis, prognosis, instructions for management, follow up, and risk factor reduction.    Perlie Mayo, MD  Pager: [phone]

## 2021-12-02 NOTE — Progress Notes (Deleted)
Pt was discussed in MDR. Pt is medically cleared for an inpatient psych bed, awaiting bed availability. Variance day documented.

## 2021-12-02 NOTE — Discharge Summary (Addendum)
Physician Discharge Summary     Patient ID:  Name Cheryl Zimmerman  MRN [id number]   Age 51 year old  DOB 11-25-1970    Admit Date: 11/26/21    Discharge Date: 12/02/21  Admitting Physician: Enos Fling, MD  Discharge Physicians   Intern: Gearldine Bienenstock (sub-Intern)   Resident: Gwenlyn Perking, MD   Attending: Nada Boozer, MD     Discharge Diagnoses: Suicidal Ideation, acute on chronic HFpEF    Issues for Follow up:     _0  Suicidal Ideation  Patient with active SI inpatient.     _1  Polypharmacy  Patient with a complicated medication list. She has had many different hospitalizations at different places, discharged to various group homes. Has been using multiple different pharmacies, likely related to housing instability, no consistent providers. Medications from her group home prior to this admission have been scanned into her chart. Important to evaluate the necessity of the medications, particularly psychiatric medications.    _2  Housing Instability  Patient with many years of housing instability. Most recently lived in a group home, was living in a respite house prior, and prior to that was living on the streets in [place]. She is understandably very upset and anxious about this situation and where she will live after this hospitalization. She reports she does not feel safe being discharged back to the same group home where she was before.     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 She was seen in the ED by psychiatry and was deemed to require inpatient psychiatry hospitalization.    While boarding in the ED, on 3/7, the patient reported "having a hard time breathing." At that time, she had an oxygen saturation 88-90 on room air. She was given a duo-neb and a spot dose of lasix. She was then put on 4L NC and has been on supplemental O2, now weaned to 2L, since.     She was having active visual hallucinations, reports seeing her dead son while in the ED. Reported to psychiatry that she is thinking actively of ways to take her own life while inpatient in the hospital. Denies HI.    Medicine bed was requested given an inpatient psychiatry bed will likely not be available for several days, and the patient is not safe to discharge in the meantime.    However, upon further evaluation, question was raised about whether or not the patient required continuous oxygen, as this was posing an issue both for psychiatric bed placement and for the patient's safety, as she has thought about and tried to strangle herself with the oxygen tubing.    Thus, she was admitted for further investigation of her oxygen needs while awaiting psychiatric placement.     Hospital Course:    While on the inpatient medicine floor, the patient was trialed off of the supplemental oxygen and was found to have oxygen saturation of 88% which improved to 93% after IV diuresis. Her oxygen saturation makes sense  for a patient with longstanding COPD, significant smoking history, CHF, and chronic hypoventilation. She had transient dyspnea, without concurrent oxygen desaturation, which improved after IV diuresis. She remained independent in her ADLs throughout her admission.    # acute on chronic HFpEF  # Difficulty breathing  Patient has COPD and has difficulty breathing on exertion at baseline. She smokes ~3 packs/daily. She is not usually on home oxygen. On admission, she  denied cough or wheeze, had clear lungs on exam, and dyspnea was limited to exertionand resolved quickly. Oxygen saturation 91-93 on 1 L of NC when examined upon admission.     On admission, dyspnea was unlikely to be COPD exacerbationgiven no change in cough or sputum, no wheeze, patient was asymptomatic without supplemental oxygen. CHF was likely not contributing to dyspnea at time of admission as the patient was looking euvolemic and was still on daily torsemide. However, it was later found that the patient was getting a torsemide dose that was lower than her home dose, as we were not able to get the group home med list until several days into her stay here. She had then become volume overloaded, evident on exam and with symptomatic complaints of dyspnea (further discussion below). These symptoms have improved with several days of diuresis. The patient never required supplemental oxygen during her admission, even with the feeling of subjective dyspnea prior to IV diuresis.    An additional component of her chronic dyspnea is likely chronic hypoventilation, supported by metabolic alkalosis upon presentation(likely in setting of chronic respiratory acidosis). Also, she likely has some additional component of functional limitation/deconditioning, as patient says she has had limited mobility due to pain.    Supplemental oxygen in patients with chronic hypoxemia has been shown to lengthen life expectancy, but as of now, the supplemental oxygen poses more significant short-term risks to the patient (self-harm via strangling self with oxygen tubing, inability to get sooner psych treatment as needing oxygen limits placement ability) that outweigh the long-term benefits of supplemental oxygen.     For now, patient continues to have satisfactory oxygen saturation without supplemental oxygen and is doing well.    # Suicidal Ideation, Risk  Patient deemed to be high risk given active suicidal ideation, attempt to harm  herself via strangling with the oxygen monitors while in the ED. She was seen by psychiatry. Met section 12 criteria. She was maintained with a 1:1 sitter with strict SI precautions while inpatient. Psych saw her while inpatient and determined she continued to need an inpatient level of psychiatry care and continued to meet section 12 criteria, with requirement for 1:1 sitter.    # Chronic Pain  # Bipolar Disorder  Patient with bipolar disorder and chronic pain syndrome, has significant pain at baseline. She says pain is worsening her depression. Knows she takes some home medications for pain but does not know which. Reports significant depression at this time, interrelated to pain. We continued her home gabapentin 600 mg; duloxetine 40 mg daily; doxepin 10 mg nightly; haldol 5 mg TID. We added naproxen prn for pain. We used seroquel 50 mg and ativan 1 mg as needed for significant anxiety and agitation. Patient also responded well to verbal redirection and non-pharmacologic interventions such as showering, hair brushing, gown changing, and using a paper and pencil to write.    # Substance Use  Urine drug screen was positive for cocaine, fentanyl upon presentation. Patient also on suboxone. Denies opioid use. Endorses marijuana, alcohol, cocaine use. There was low  concern for withdrawal upon admission to the inpatient floor as she was asymptomatic from a withdrawal perspective on current medication regimen while in ED for 3-4 days. We ontinued suboxone 8-2 mg three times daily; acamprosate three times daily.    # Hx of HFpEF  Patient with history of CHF. The patient did develop volume overload on exam on 3/14 with some associated worsening in subjective breathing. Patient never required supplemental oxygen. We gave her IV diuresis after which her subjective dyspnea improved. She was transitioned back to PO torsemide on 3/16. We changed her dose to 80 mg torsemide BID.    # Gastritis  Patient has a history of  gastritis. Denies any current abdominal pain, reflux symptoms. We continued home protonix 40 mg daily.    # History of Seizures  Patient with remote history of seizures. We continued home oxcarbazapine 150 twice daily; home levetiracetam 750 mg twice daily.    # History of Hypothyroidism  Patient with history of hypothyroidism. Controlled on home levothyroxine 25 mcg, which we continued.    # Constipation  Has some chronic issues with constipation. Denies any constipation at this time. We continued home colace 100 mg daily as needed.    # Cardiac Risk factors  Given significant cardiovascular risk factors (smoking, hyperlipidemia, obesity) we continued home atorvastatin 80 mg; home aspirin 81 mg.    # Tobacco Use  Patient reports smoking up to 3 packs/day. Used nicotine gum and nicotine patch to good effect while inpatient.    # Allergies  Patient with some baseline allergies. We continued home Flonase; home loratadine.    # Polypharmacy  Patient on many medications and with many hospitalizations and discharges to different group homes/living facilities/unstable housing over several years. We did our best to reconcile her medication list. ED provider was able to get the med list from the group home, primary team was unable to contact them.    Key labs, imaging, other tests:       Chemistries:  Recent Labs     11/30/21  0712 11/30/21  1455 12/02/21  0739   NA 142 143 141   K 3.5 3.7 3.2*   CO2 34* 27 35*   BUN 22* 21* 21*   CREAT 0.8 0.9 0.8   CA 9.4 9.2 9.7   MG  --  1.9  --    PHOS  --  3.9  --    ANION _0 GFR > 60 > 60 > 60    GI:  No results for input(s): AST, ALT, TBILI, DBILI, IBIL, ALKPHOS, GGTP, ALBUMIN, LIP in the last 72 hours.   CBC:  No results for input(s): WBC, HGB, HCT, PLTA in the last 72 hours. Coags:   No results for input(s): INR, PT, APTT, HPTT, FIB in the last 72 hours.   Trops, BNP, D-dimer, Lactate, C-RP, Procal, CK:  Recent Labs     11/30/21  0930   TROPTHS 17*   PROBNP 42        Finger Sticks:  No results for input(s): FINGERSTICKR in the last 72 hours. Urinalysis:  No results for input(s): UACOL, UACLA, UAGLU, UABIL, UAKET, SPEGRAVURINE, UAOCC, UAPH, UAPRO, UABAC, UARBC, UANIT, LEUKOCYTES, UABAC, UASQE in the last 72 hours.    Invalid input(s): Elonda Husky       XR Chest Portable    Result Date: 11/24/2021  TECHNIQUE: Portable chest, 2:52 PM and repeat at 2:54 PM INDICATION: Hypoxia COMPARISON: 05/31/2011  FINDINGS: Quality: Satisfactory.   Tubes/lines: None. Lungs: There is a linear density consistent with atelectasis in the left base. There is overlying breast tissue obscuring a portion of the right base. No definite infiltrates are seen. Pleura: There is no pleural effusion or pneumothorax. Heart: The cardiac silhouette is unremarkable. Mediastinum/hila: Unremarkable. Bones and Soft Tissues: There are degenerative changes and scoliosis of the thoracic spine.   IMPRESSION: Bibasilar atelectasis, otherwise no acute cardiopulmonary findings.   Reviewed and Electronically Signed by: Penni Homans MD Signed Date/Time: 11-24-2021 15:11:59         Pending Laboratory Work and Tests: n/a    Discharge Exam:  BP 100/64    Pulse 76    Temp 98 F (36.7 C) (Oral)    Resp 18    Wt (!) 150.1 kg (331 lb)    SpO2 94%   FAO:ZHYQM in bed, watching tv.  HEENT: PERRLA, MMM.  CV: RRR. Normal S1, S2. No m/r/g.  Lungs:Normal work of breathing. Clear to auscultation bilaterally, no wheezes, crackles.  Abd: NABS. Soft, NTND.  Ext: Warm.  Edema of the legs to the knees.  Neuro: A&Ox4. CN 2-12 grossly intact.Moving all limbs spontaneously.  Skin: No rashes.  T/L/D:Left peripheral IV.    Discharge Medication List:    Current Discharge Medication List    START taking these medications    aspirin 81 MG chewable tablet  Take 1 tablet by mouth in the morning.    doxepin (SINEQUAN) 10 MG capsule  Take 1 capsule by mouth nightly    ferrous sulfate 324 (65 Fe) MG TBEC EC tablet  Take 1 tablet by  mouth every other day    LORazepam (ATIVAN) 1 MG tablet  Take 1 tablet by mouth 2 (two) times daily as needed    melatonin 3 MG TABS tablet  Take 1 tablet by mouth nightly    naproxen (NAPROSYN) 250 MG tablet  Take 1 tablet by mouth 2 (two) times daily as needed for Pain    nicotine (NICODERM CQ) 21 MG/24HR  Place 1 patch onto the skin in the morning.    nicotine polacrilex (NICORETTE) 2 MG gum  Take 1 each by mouth every hour as needed for Craving (Nicotine)    prazosin (MINIPRESS) 2 MG capsule  Take 1 capsule by mouth nightly    sennosides (SENOKOT) 8.6 MG tablet  Take 2 tablets by mouth nightly as needed for Constipation (secondline)      CONTINUE these medications which have CHANGED    budesonide-formoterol (SYMBICORT) 160-4.5 MCG/ACT inhaler  Inhale 2 puffs into the lungs in the morning and 2 puffs before bedtime.  Qty: 1 each Refills: 0    buprenorphine-naloxone (SUBOXONE) 8-2 MG sublingual tablet  Place 1 tablet under the tongue in the morning and 1 tablet at noon and 1 tablet before bedtime. Max Daily Amount: 3 tablets.    diclofenac (VOLTAREN) 1 % GEL Gel  Apply 4 g topically 4 (four) times daily as needed (knee pain)  Qty: 100 g Refills: 0    DULoxetine 40 MG CPEP  Take 40 mg by mouth daily    haloperidol (HALDOL) 5 MG tablet  Take 1 tablet by mouth in the morning and 1 tablet at noon and 1 tablet before bedtime.    QUEtiapine (SEROQUEL) 50 MG tablet  Take 1 tablet by mouth every 6 (six) hours as needed    torsemide (DEMADEX) 20 MG tablet  Take 3 tablets by mouth in the morning and 3 tablets  before bedtime.      CONTINUE these medications which have NOT CHANGED    acamprosate (CAMPRAL) 333 MG tablet  Take 666 mg by mouth in the morning and 666 mg at noon and 666 mg before bedtime.    methocarbamol (ROBAXIN) 750 MG tablet  Take 750 mg by mouth in the morning and 750 mg at noon and 750 mg in the evening. Take before meals.    fluticasone (FLONASE) 50 MCG/ACT nasal spray  1 spray by Each Nostril route 2 (two)  times daily    ferrous gluconate (FERGON) 324 MG tablet  Take 324 mg by mouth 3 (three) times a week Monday, Wednesday, Friday    atorvastatin (LIPITOR) 80 MG tablet  Take 80 mg by mouth nightly    gabapentin (NEURONTIN) 300 MG capsule  Take 600 mg by mouth in the morning and 600 mg at noon and 600 mg before bedtime.    docusate sodium (COLACE) 100 MG capsule  Take 100 mg by mouth daily as needed for Constipation    EPINEPHrine (EPIPEN JR 1:2000) 0.15 MG/0.15ML injection  Inject 0.15 mg into the muscle as needed    nystatin (MYCOSTATIN) cream  Apply topically 2 (two) times daily as needed (Rash, redness)    nystatin (MYCOSTATIN) powder  Apply topically 2 (two) times daily as needed (Rash, redness)    albuterol HFA 108 (90 Base) MCG/ACT inhaler  Inhale 2 puffs into the lungs every 4 (four) hours as needed for Wheezing or Shortness of breath    ondansetron (ZOFRAN-ODT) 4 MG disintegrating tablet  Take 4 mg by mouth every 8 (eight) hours as needed for Nausea    levothyroxine (SYNTHROID) 25 MCG tablet  Take 25 mcg by mouth every morning before breakfast    loratadine (CLARITIN) 10 MG tablet  Take 10 mg by mouth in the morning.    omeprazole (PRILOSEC) 40 MG capsule  Take 40 mg by mouth in the morning.    OXcarbazepine (TRILEPTAL) 150 MG tablet  Take 150 mg by mouth in the morning and 150 mg before bedtime.    potassium chloride SA (K-DUR) 20 MEQ tablet  Take 20 mEq by mouth in the morning and 20 mEq before bedtime.    levETIRAcetam (KEPPRA) 750 MG tablet  Take 750 mg by mouth in the morning and 750 mg before bedtime.    thiamine (VITAMIN B-1) 100 MG tablet  Take 1 tablet by mouth daily.  Qty: 30 tablet Refills: 12      STOP taking these medications    doxepin (SINEQUAN) 10 MG/ML solution  Comments:  Reason for Stopping:    potassium chloride (KLOR-CON) 20 MEQ packet  Comments:  Reason for Stopping:    aspirin 81 MG EC tablet  Comments:  Reason for Stopping:    ibuprofen (ADVIL,MOTRIN) 400 MG tablet  Comments:  Reason  for Stopping:          Code Status at Admission: Full Code  Code Status at Discharge: [place] Proxy and Phone Number: Data Unavailable  Data Unavailable   Diet: Regular  Wound Care: N/a  Discharge Destination: Psychiatric Facility     Future appointments:   Current and Future Appointments at [facility] (90 Days) 12/02/2021 - 03/02/2022    None        Follow-up Information    None         To reach medical records at any time call the [facility] operator at [phone]  To reach the covering hospitalist, page [phone]  To reach the [facility] for pending results, call [phone]    Signed:  Gearldine Bienenstock, 12/02/2021     Discussed with Resident Gwenlyn Perking, MD, and attending, Dr. Perlie Mayo, MD.     On day of discharge, I spent greater than 30 minutes face-to-face with this patient and unit time, of which greater than 50% of that time was spent counseling/coordinating care including reviewing the chart, meeting with the treatment team, meeting with family/caretakers, patient/family education and discussing diagnosis, prognosis, instructions for management, follow up, and risk factor reduction.    Perlie Mayo, MD  Pager: [phone]

## 2021-12-02 NOTE — Consults (Addendum)
Called [facility] [phone], [phone]    Authorized for today 3/16 through 12/06/2021    Ref # [id number]    Per Earna Coder.       Facility updated to [facility] with Raynald Kemp.                 Richardean Canal, MD  Psychiatry PGY-2, [phone]

## 2021-12-02 NOTE — Progress Notes (Signed)
PROGRESS NOTE  12/02/2021    PATIENT INFO: Cheryl Zimmerman, [age] year old female  DATE OF ADMISSION: 11/26/21    ROOM/BED LOCATION:  606/606-A  HOSPITAL DAY: 6    REVIEW OF OVERNIGHT EVENTS:  No acute events overnight.    SUBJECTIVE:  This morning patient reports feeling "tired," but says her breathing is improved. Denies any current chest pain, dyspnea, wheezing. Endorses significant chronic pain. Says she woke up from a few naps yesterday due to very bad nightmares, seeing her dead son.     OBJECTIVE:    VITAL SIGNS      12/02/21  0600 12/02/21  0702 12/02/21  0813 12/02/21  0900   BP: 127/86 130/82  100/64   Pulse: 89 85  76   Resp: _0 Temp: 97.7 F (36.5 C)   98 F (36.7 C)   TempSrc: Oral   Oral   SpO2: 94%   94%   Weight: (!) 150.1 kg (331 lb)          INS/OUTS (PAST 24 HOURS)  I/O 24 Hrs:  In: 1440 [P.O.:1440]  Out: 2100 [Urine:2100]    PHYSICAL EXAM   DGL:OVFIE in bed, watching tv.  HEENT: PERRLA, MMM.  CV: RRR. Normal S1, S2. No m/r/g.  Lungs:Normal work of breathing. Clear to auscultation bilaterally, no wheezes, crackles.  Abd: NABS. Soft, NTND.  Ext: Warm.  2+ edema of the legs to the knees.  Neuro: A&Ox4. CN 2-12 grossly intact.Moving all limbs spontaneously.  Skin: No rashes.  T/L/D:Left peripheral IV.    RECENT LABS  BMP:   Recent Labs     11/30/21  0712 11/30/21  1455 12/02/21  0739   NA 142 143 141   K 3.5 3.7 3.2*   CL 97* 97* 95*   CO2 34* 27 35*   BUN 22* 21* 21*   CREAT 0.8 0.9 0.8   GLUCOSER 129 127 144     Ca, Mg, Phos:   Recent Labs     11/30/21  0712 11/30/21  1455 12/02/21  0739   CA 9.4 9.2 9.7   MG  --  1.9  --    PHOS  --  3.9  --      CBC: No results for input(s): WBC, HCT, HGB, PLTA, RBC in the last 72 hours.  Coagulation Labs: No results for input(s): PT, INR in the last 72 hours.    Invalid input(s): PTT  LFTs: No results for input(s): AST, ALT, TBILI, DBILI, ALKPHOS in the last 72 hours.  Pancreatic Enzymes: No results for input(s): AMY, LIP in the last  72 hours.  Urinalysis:No results for input(s): UACOL, UACLA, UAGLU, UABIL, UAKET, SPEGRAVURINE, UAOCC, UAPH, UAPRO, UANIT, LEUKOCYTES in the last 72 hours.    Invalid input(s): UAOBU  Troponin:  TROPONIN T HS RANDOM (ng/L)   Date Value   11/30/2021 17 (H)     DELTA 1 HOUR TROPONIN T HS (ng/L)   Date Value   11/24/2021 0     DELTA 3 HOUR TROPONIN T HS (ng/L)   Date Value   11/24/2021 1       MICROBIOLOGY REVIEW  No new microbiology results to review.    IMAGING AND OTHER STUDIES  No new imaging or other studies to review.    MEDICATIONS  See list in [place]     Patient is a 51 year-old female with history ofhousing instability,bipolar disorder, schizoaffective disorder,  polysubstance use,PTSD, multiple suicide attempts,history ofsystolic congestive heart failure, seizure disorder on keppra, complex pain syndrome, COPD, bronchial asthma, and obesity, presenting with suicidal ideation, with some hypoxemia, some component of which appears to be baseline for her, though with an additional element of volume overload, now improved after diuresis. Now awaiting inpatient level of psychiatric care.    # Hand pain  # Acute dystonia  Patient developed acute dystonia after initiation of aripiprazole. Had no other focal deficits making stroke unlikely, and dystonia resolved with benadryl and cessation of aripiprazole.  - Avoid aripiprazole, continue to monitor    # COPD  # Difficulty breathing  Patient has COPD and has difficulty breathing on exertion at baseline. She smokes ~3 packs/daily. She has given some differing reports of her home oxygen use, but it seems most likely that she is not usually on home oxygen. She says "people want me to be" when asked if she is on home oxygen, but also has housing instability and says she has not used oxygen at home recently. On admission, she denied cough or wheeze, had clear lungs on exam, and dyspnea was limited to exertionand resolved quickly. Oxygen saturation  91-93 on 1 L of NC when examined upon admission.     On admission, dyspnea unlikely to be COPD exacerbationgiven no change in cough or sputum, no wheeze, patient is able to stand and breathe without oxygen while being examined. CHF was likely not contributing to dyspnea at time of presentation as the patient was looking euvolemic and was still on daily torsemide. However, it was later found that the patient was getting a torsemide dose that was lower than her home dose, as we were not able to get the group home med list until several days into her stay here. She had then become volume overloaded, evident on exam and with symptomatic complaints of chest heaviness and dyspnea (further discussion below). These symptoms have improved with several days of diuresis.    An additional component of chronic dyspnea is likely chronic hypoventilation, supported by metabolic alkalosis upon presentation (likely in setting of chronic respiratory acidosis) improvement with oxygen administration. Also, likely has some additional component of functional limitation/deconditioning, as patient says she has had limited mobility due to pain.    As this hypoxia is chronic, a further work-up could help to elucidate the contributing factors. Patient could undergo outpatient work-up for chronic hypoxemia to qualify for home supplemental O2. Work-up would include PFTs (to assess severity of COPD), V/Q scan (can assess for V/Q mismatch due to chronic PE, emphysema), measurement of DLCO (to assess for reduced DLCO as additional component of hypoxemia, especially given significant smoking history high risk for emphysema).    Supplemental oxygen in patients with chronic hypoxemia has been shown to lengthen life expectancy, but as of now, the supplemental oxygen poses more significant short-term risks to the patient (self-harm via strangling self with oxygen tubing, inability to get sooner psych treatment as needing oxygen limits placement  ability) that outweigh the long-term benefits of supplemental oxygen.     For now, patient continues to have satisfactory oxygen saturation without supplemental oxygen and is doing well.    - Keep off oxygen   - Continue home symbicort BID   - Albuterol prn    # Suicidal Ideation, Risk  Patient with active SI, seen by psychiatry in emergency room, determined to be high risk. Psych has been consulted and is following. Bed search in process, was being temporarily held as patient  now needs diuresis. Today, 3/16, patient is medically cleared for resumed bed search. Her continued diuresis can be accomplished with PO meds and is not symptomatic. Work with psych/case management to resume bed search.    - Section 12 criteria met  - SI precautions, 1:1 sitter  - Continue encouraging non-pharmacologic supportive measures like observed hair brushing, observed paper and pencil, observed showering    # Hx of HFpEF  # Acute on chronic HFpEF exacerbation  # Chest heaviness/pain    Patient with history of CHF. Now looking volume overloaded on exam (bilateral LE edema, other markers hard to assess) and bedside POCUS on 3/13 showing distended JVP. As patient had been on a lower torsemide dose here than she was at home, increasing volume overload makes sense in setting of being under-diuresed for several days. Patient received several doses of lasix and now reports improved breathing without further sensations of chest heaviness. Switching to PO torsemide today so can resume medical clearance and continue psych bed search.    Of note, around 3/14, patient reported some chest "heaviness." EKG at the time of her pain was reassuring against ACS. PE unlikely, wells 0. Patient was grossly volume overloaded on exam. The pain was attributed most likely to sensation of tightness related to overload. IV lasix was initiated and patient felt better after several doses.    3/14: Net negative 1750, received lasix 80 mg IV twice  3/15: Net  negative 870, received morning 80 mg IV lasix and afternoon 120 mg IV lasix    Oxygen requirement did not change throughout the week, patient remained off of supplemental O2.    Received 120 mg IV lasix this morning, will switch to PO torsemide 60 mg this afternoon.     - Switch to 60 mg torsemide later, continue 60 mg BID going forward, pending BMP continues to be normal without evidence of AKI  - Monitor exam with diuresis  - Try for 2L fluid restriction to help with diuresis    # Hypokalemia  # Hypochloremia  Patient with decreased K and chloride this morning, most likely in setting of diuresis. Will replete with 120 mEq (to make up for the gap and cover for continued diuresis). Will continue to monitor.     # Chronic Pain  # Bipolar Disorder  # PTSD  Patient withbipolar disorder andchronic pain syndrome,has significant pain at baseline. She is very concerned with the pain and says pain is worsening her depression. Knows she takes some home medications for pain but does not know which. Reports significant depression at this time, interrelated to pain. Also having PTSD symptoms including recurrent nightmares, and visual hallucinations of her dead son. These have been very disruptive to her sleep.  - Continue home gabapentin 600 mg  - Continue duloxetine 40 mg daily  - Continue doxepin 10 mg nightly  - Continue haldol 5 mg TID  - Added prazosin 1 mg for two nights (2nd dose last night, 3/15), increase to 2 mg nightly starting tonight (3/17) per psych recs for nightmares/PTSD  - Use Seroquel for 1st line and ativan 2nd line for prn for agitation   - Psych is consulted and following her  - Changed ibuprofen prn for pain to naproxen prn, better for CHF exacerbation  - Continue to engagement in empathetic listening and pursue non-pharmacologic methods of support like enabling patient to shower or brush her hair which she reports is calming to her    # Substance Use  Urine drug  screen positive for cocaine, fentanyl  upon presentation. Patient also on suboxone. Denies opioid use. Endorses marijuana, alcohol, cocaine use. History heavy alcohol use, but less active now per patient report.Low concern for withdrawal developing at this point given she's been without withdrawal symptoms on current medication regimen for a week.  - Continue suboxone 8-2 mg three times daily  - Continue acamprosate three times daily  - B12 was normal    # Gastritis  Patient has a history of gastritis. Denies any current abdominal pain, reflux symptoms.  - Continue home protonix 40 mg daily    # History of Seizures  Patient with remote history of seizures.   - Continue home oxcarbazapine 150 twice daily  - Continue home levetiracetam 750 mg twice daily    # History of Hypothyroidism  Patient with history of hypothyroidism. Controlled on home levo.  - Continue levothyroxine 25 mcg every morning    # Constipation  - Continue home colace 100 mg daily prn    # Cardiac Risk factors  - Continue home atorvastatin 80 mg  - Continue home aspirin 81 mg    # Tobacco Use  Patient reports smoking up to 3 packs/day. Using nicotine gum and nicotine patch to good effect.  - Continue nicotine patch  - Continue nicotine gum     # Allergies  - Continue home Flonase  - Continue home Loratadine    # Polypharmacy  Patient on many medications and with many hospitalizations and discharges to different group homes/living facilities/unstable housing over several years. Important to assess necessity of each medication. Group home medication list was obtained on 3/13. Able to reconcile with her current meds and going forward will be maintained on the list consistent with group home list. Scanned into chart.    Diet:full    DVT Prophylaxis:subcutaneous heparin    Medication Reconciliation:Done with group home, donewith pharmacies to best ability (pt uses multiple pharmacies)    Was medication list from nursing facility collected & reviewed?n/a    Changes from  home medication regimen: as above.    Code Status:Full Code    Health care proxy:Data UnavailableData Unavailable    Dispo:Inpatient PsychFacility once bed is available    Gearldine Bienenstock, [facility] MS3  [phone]    Discussed with resident Dr. Gwenlyn Perking and attending Dr. Perlie Mayo, MD.

## 2021-12-02 NOTE — Plan of Care (Signed)
Problem: Tobacco/Nicotine Abuse  Goal: Risk control - tobacco abuse  Description: Actions to eliminate or reduce tobacco use.  Outcome: Progressing     Problem: Safety  Goal: Free from accidental physical injury  Outcome: Progressing     Problem: Pain  Goal: Patient's pain/discomfort is manageable  Description: Assess and monitor patient's pain using appropriate pain scale. Collaborate with interdisciplinary team and initiate plan and interventions as ordered. Re-assess patient's pain level 30 - 60 minutes after pain management intervention.   Outcome: Progressing     Pt alert to self and situation, confused on time and location. Pt forgetful. Calm and cooperative this shift. 1:1 sitter in place for SI. SBA in room with walker. Pt endorsing chronic generalized pain, medicated appropriately per mar with little effect. Frequently asks for nicotine gum. Vitals stable on RA. Pt reports shortness of breath upon exertion, relieved with rest. Pt able to make needs known.
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
## 2021-12-02 NOTE — Plan of Care (Addendum)
A/ox3. Vss. Afebrile. Calm and cooperative. Pt denies sob or chest pain. Dim lung sounds. Tolerating po meds, denies n/v. Receiving prn nicotine per order. Tolerating po meds with water. Pt able to make needs known. Plan of care continues.     1500-pt d/c. report given to [facility]. IV removed, intact. Pt awaiting on transport. All belongings secured in bin to go with patient.     Problem: Safety  Goal: Free from accidental physical injury  Outcome: Progressing   Pt remains on constant Observation with 1:1 sitter.

## 2021-12-02 NOTE — Consults (Signed)
[facility] Psychiatry Service Follow-up Note    Prior psychiatry consult notes as well as interim history reviewed in EPIC.   Remains safe on one-to-one.  Adherent to medications. Per medicine medically cleared - not requiring oxygen.   On interview, appears freshly groomed, interval improvement in mood. Cooperative. States that she shaved and showered which were "not bad" but was having constant thoughts of cutting herself while she was shaving. Was able to stay safe, however. Still wanting IPLOC. Reports still having "racing thoughts of suicide." No HI or AVH noted today.   BP 100/64    Pulse 76    Temp 98 F (36.7 C) (Oral)    Resp 18    Wt (!) 150.1 kg (331 lb)    SpO2 94%     Standing medications:    prazosin  2 mg Oral Nightly    nystatin   Topical BID    haloperidol  5 mg Oral TID    heparin (porcine)  5,000 Units Subcutaneous Q12H [facility]    nicotine  1 patch Transdermal Daily    doxepin  10 mg Oral Nightly    DULoxetine  40 mg Oral Daily    melatonin  3 mg Oral Nightly    methocarbamol  750 mg Oral TID    aspirin  81 mg Oral Daily    loratadine  10 mg Oral Daily    fluticasone  1 spray Each Nostril Daily    levETIRAcetam  750 mg Oral BID    atorvastatin  80 mg Oral Nightly    gabapentin  600 mg Oral TID    pantoprazole  40 mg Oral Daily    buprenorphine-naloxone  8 mg Sublingual TID    budesonide-formoterol  2 puff Inhalation 2 times daily    levothyroxine  25 mcg Oral DAILY    OXcarbazepine  150 mg Oral BID    ferrous sulfate  324 mg Oral Q48H    acamprosate  333 mg Oral TID       PRN Medications:   Current Facility-Administered Medications   Medication Dose Route Frequency Last Admin    naproxen  250 mg Oral BID PRN 250 mg at 12/02/21 0813    QUEtiapine  50 mg Oral Q6H PRN 50 mg at 12/01/21 1640    LORazepam  1 mg Oral BID PRN 1 mg at 12/01/21 2026    diclofenac  4 g Topical 4x Daily PRN 4 g at 11/30/21 0844    sennosides  17.2 mg Oral Nightly PRN      nicotine polacrilex  2 mg Oral Q1H  PRN 2 mg at 12/02/21 1318    docusate sodium  100 mg Oral Daily PRN      albuterol HFA  2 puff Inhalation Q6H PRN 2 puff at 11/25/21 2139        Mental Status Exam:  Mental Status Exam   General Appearance: Dressed appropriately;Clean  Behavior: Cooperative;Good eye contact  Level of Consciousness: Alert  Orientation Level: Grossly Intact  Attention/Concentration: WNL  Mannerisms/Movements: No abnormal mannerisms/movements  Speech Quality and Rate: WNL  Speech Clarity: Clear  Speech Tone: Normal vocal inflection  Vocabulary/Fund of Knowledge: WNL  Memory: Grossly intact  Thought Process & Associations: Goal-directed;Linear;Organized;Logical  Dissociative Symptoms: None  Thought Content: No abnormalities reported or observed  Delusions: None  Hallucinations: None  Suicidal Thoughts: Active thoughts;Plans;Acute;Chronic;Reasons for not doing it;Future oriented  Homicidal Thoughts: None  Mood: Depressed/Sad  * Mood Comment: "Depressed"  Affect: Depressed;Anxious;Congruent w/mood;Full range  Judgment: Fair  Insight: Fair    Impression:  Cheryl Zimmerman is a 51 year old English-speaking woman with longstanding psych history, including chart diagnoses of PTSD, SCAD, BPAD, and PSUD, multiple hospitalizations starting in childhood, on antipsychotic medication since age 51, multiple prior suicide attempts and SIB requiring medical attention, severe head trauma and seizure d/o, and complex medical illness, who lives in a group home, presenting to ED with increasing suicidal ideation, hallucinations, and trauma reexperiencing symptoms, admitted to medicine for evaluation of need for supplemental oxygen after attempting to strangle self with oxygen tubing in ED.    Diagnostic uncertainty given overlap in chart diagnoses.  Patient identifies her most concerning symptoms are those associated with PTSD, namely nightmares, intrusive memories.  Visual hallucinations may be decompensation of SCAD, trauma-related (e.g. flashbacks),  major depressive episode with psychotic features, or transient psychosis in the setting of stress as seen in some personality disorders.  Memory deficits on exam may be due to severe history of head injury, polypharmacy and substance use, or vitamin deficiency.    In terms of risk, patient endorses SI with intent and method (states she will jump in front of a train at the nearest station as soon as she is discharged) and believes that ending her life will reunite her and have been with her deceased son.  Her trauma-reexperiencing and psychotic symptoms related to her son are heightening her risk of suicide.  Additional risk factors include history of multiple attempts, recent SIB requiring medical attention, history of head trauma, comorbid substance use, preparatory behaviors/attempt in ED, impulsivity, perceived lack of social support, and chronic pain.  There are a few mitigating risk factors and patient is not agreeable to safety planning.  For this reason, patient meets criterion 1 of section 12 and inpatient psychiatric care is necessary to optimize medications, provide diagnostic clarification, and mitigate risk of harm to self.    DSM-V    Primary Diagnosis: PTSD (post-traumatic stress disorder)   Diagnosis: Schizoaffective disorder, bipolar type ([name])   Psychosocial and Contextual Factors: Housing problems     3/12: Patient remains with active SI with intent.  Unclear what patient's acute involuntary movements represent, but does not appear to be dystonic reaction.  Would not start benztropine or Benadryl at this time.  Okay to continue psychiatric medications.  Since patient is not taking Abilify at home, can discontinue that now.  Would be good to reach out to provider tomorrow to collaborate on plan moving forward.  Patient may have some tardive dyskinesia related to long-term antipsychotic use.  Remains on section.  3/13: med list confirmed from group home, OK to continue holding home abilify. Still  requiring 1:1 for active suicidal ideation   3/14: still requiring 1:1, reasonable to allow for shaving if nursing is comfortable and observing, reasonable to start prazosin 1mg  qhs for PTSD-related nightmares  3[facility]15[name]     Plan:   Agree with previous C/L plan with bolded additions :    -Patient meets section 12 criteria and may not leave AMA  -Bed search pending, likely geri due to medical complexity and use of rolling walker - we will look into if regular adult psych bed is feasible  - continue home medications   -Encourage pt to take quetiapine 50mg  q6h prn for anxiety before lorazepam 1mg  bid  -Ok for pt to have pen, hairbrush, plastic spoon, and other non-sharp objects under supervision. Please remove once pt is done with  the objects.  -Patient should remain on 1:1 observation for continued suicidal intent.  -Consider obtaining thiamine, B12 levels given extensive alcohol use history and memory deficits.  -Okay to discontinue Abilify at this time as patient not taking at home  -please start prazosin 1mg  qhs for nightmares, can go up to 2mg  qhs after 2 doses, hold for sbp<90    Discussed case with Dr.    Thank you for this consult.  CL will continue to follow.  For questions over the weekend please page on-call psychiatry resident at [phone].    Dimas Aguas, MD  Psychiatry PGY-2, [phone]

## 2021-12-02 NOTE — Progress Notes (Signed)
S:  I want to walk.  O: Please see Vital Sign and Pain Assessment flowsheets for vitals and pain documentation.  Plan of care has been reviewed.   12/02/21 1338   Language Information   Language of Care English   Rehab Discipline   Rehab Discipline PT   Weight Bearing Status   RLE FWB   LLE FWB   RUE FWB   LUE FWB   Mobility / Balance   Mobility / Balance Yes   Transfers   Transfer Yes   Transfer 1   Transfer From 1 Bed   Transfer Type 1 To and from   Transfer to 1 Stand   Technique 1 Sit to stand;Stand to sit   Transfer Device 1 Rollator (Four wheel walker)   Transfer Level of Assistance 1 Independent   Trials/Comments 1 2   Gait   Gait Yes   Gait 1   Assistive Device 1 Rollator (Four wheel walker)   Pattern 1 L Decreased stance time   Gait Assistance 1 Close supervision   Distance (Ft) 1 250 Feet  (125' x2)   Balance   Sitting - Static Feet supported;Sits without support for > 30 sec   Sitting - Dynamic Feet supported;No upper extremity supported   Standing - Static Bilateral upper extremity supported;Able to maintain 60 sec   Standing - Dynamic Forward lean;Reaching for objects   Posture Rounded shoulders;Forward head;Kyphosis   Ther Exercise   Therapeutic Exercise? No   Safety Devices   Type of Devices   Psychiatrist)   Plan   Prognosis Good   PT Frequency 5x/wk   Recommendation   Equipment Recommended Rollator (Four wheel walker)       A:  Pt independently transfers supine<>sit. Sit<>stand with rollator  independently.  Ambulated 250' with one seated rest.  Requires cues to lock rollator when sitting. Pt c/o instability of L knee.  Ace bandage placed for support during ambulation. States MD mentioned a brace. PT doesn't provide brace equipment.  Removed from Pt after TX.  2/2  on SI watch.  Seated EOB. All needs in reach.   P: Continue per PT Care Plan.   Harlin Rain, PTA, Lic # [id number]

## 2021-12-03 ENCOUNTER — Encounter (HOSPITAL_BASED_OUTPATIENT_CLINIC_OR_DEPARTMENT_OTHER): Payer: Self-pay | Admitting: Psychiatry - General

## 2021-12-03 DIAGNOSIS — F32A Depression, unspecified: Secondary | ICD-10-CM

## 2021-12-03 DIAGNOSIS — F419 Anxiety disorder, unspecified: Secondary | ICD-10-CM

## 2021-12-03 DIAGNOSIS — R0902 Hypoxemia: Secondary | ICD-10-CM

## 2021-12-03 DIAGNOSIS — I5020 Unspecified systolic (congestive) heart failure: Secondary | ICD-10-CM

## 2021-12-03 DIAGNOSIS — G4733 Obstructive sleep apnea (adult) (pediatric): Secondary | ICD-10-CM

## 2021-12-03 DIAGNOSIS — I5033 Acute on chronic diastolic (congestive) heart failure: Secondary | ICD-10-CM

## 2021-12-03 DIAGNOSIS — E669 Obesity, unspecified: Secondary | ICD-10-CM

## 2021-12-03 DIAGNOSIS — F4321 Adjustment disorder with depressed mood: Secondary | ICD-10-CM

## 2021-12-03 DIAGNOSIS — J449 Chronic obstructive pulmonary disease, unspecified: Secondary | ICD-10-CM

## 2021-12-03 DIAGNOSIS — G40909 Epilepsy, unspecified, not intractable, without status epilepticus: Secondary | ICD-10-CM

## 2021-12-03 DIAGNOSIS — R45851 Suicidal ideations: Secondary | ICD-10-CM

## 2021-12-03 DIAGNOSIS — F1910 Other psychoactive substance abuse, uncomplicated: Secondary | ICD-10-CM

## 2021-12-03 DIAGNOSIS — F250 Schizoaffective disorder, bipolar type: Secondary | ICD-10-CM

## 2021-12-03 DIAGNOSIS — R0600 Dyspnea, unspecified: Secondary | ICD-10-CM

## 2021-12-03 DIAGNOSIS — F1920 Other psychoactive substance dependence, uncomplicated: Secondary | ICD-10-CM

## 2021-12-03 DIAGNOSIS — F4310 Post-traumatic stress disorder, unspecified: Secondary | ICD-10-CM

## 2021-12-03 DIAGNOSIS — F251 Schizoaffective disorder, depressive type: Principal | ICD-10-CM

## 2021-12-03 LAB — HEMOGLOBIN A1C
ESTIMATED AVERAGE GLUCOSE: 126 mg/dL (ref 74–160)
HEMOGLOBIN A1C: 6 % — ABNORMAL HIGH (ref 4.0–5.6)

## 2021-12-03 MED ORDER — HALOPERIDOL 5 MG PO TABS
5.0000 mg | ORAL_TABLET | ORAL | Status: DC | PRN
Start: 2021-12-03 — End: 2021-12-05

## 2021-12-03 MED ORDER — LORAZEPAM 2 MG PO TABS
2.0000 mg | ORAL_TABLET | ORAL | Status: DC | PRN
Start: 2021-12-03 — End: 2021-12-05
  Administered 2021-12-03: 2 mg via ORAL
  Filled 2021-12-03: qty 1

## 2021-12-03 MED ORDER — LOXAPINE SUCCINATE 25 MG PO CAPS
25.0000 mg | ORAL_CAPSULE | Freq: Two times a day (BID) | ORAL | Status: DC
Start: 2021-12-03 — End: 2021-12-05
  Administered 2021-12-03 – 2021-12-05 (×5): 25 mg via ORAL
  Filled 2021-12-03 (×5): qty 1

## 2021-12-03 MED ORDER — FLUOXETINE HCL 20 MG PO CAPS
20.0000 mg | ORAL_CAPSULE | Freq: Every day | ORAL | Status: DC
Start: 2021-12-03 — End: 2021-12-05
  Administered 2021-12-03 – 2021-12-05 (×3): 20 mg via ORAL
  Filled 2021-12-03 (×3): qty 1

## 2021-12-03 MED ORDER — TORSEMIDE 20 MG PO TABS
80.0000 mg | ORAL_TABLET | Freq: Two times a day (BID) | ORAL | Status: DC
Start: 2021-12-03 — End: 2021-12-03

## 2021-12-03 MED ORDER — TORSEMIDE 20 MG PO TABS
80.0000 mg | ORAL_TABLET | Freq: Two times a day (BID) | ORAL | Status: DC
Start: 2021-12-03 — End: 2021-12-05
  Administered 2021-12-03 – 2021-12-05 (×6): 80 mg via ORAL
  Filled 2021-12-03 (×6): qty 4

## 2021-12-03 NOTE — Initial Assessments (Signed)
Initial Occupational Therapy Evaluation    HPI/Identifying information: Chart reviewed by OT. Pt is a 51yo female. Psychiatric hx of bipolar disorder, schizoaffective disorder, polysubstance use, PTSD, multiple SA, in the ED attempted to strangle herself with EKG wires (04/2021). Multiple previous IPLOC. Medical hx of chronic systolic congestive heart failure, seizure disorder, on Keppra,  pain syndrome, COPD, Bronchial asthma, morbid obesity. For current admission, pt presented to ER on 3/7 with SI and plan to jump in front of train. Pt for disorganized behavior in public, then requested to see psychiatry. While waiting for a psychiatry bed, pt was [facility] to [facility] from 3/7-3.17.  Subjective: When asked about how she felt now that she was in the hospital: Not any better. When asked about supports: No one cares about me. I want to be happy. I want my old life back.    Pt goal: When asked what their goal for treatment, pt stated get on my medication right. And no one on the outside bothering me. Pt also identified that she would be interested in learning new sensory-based coping skills - particularly auditory coping.     Objective: Pt has been observed in her room. Pt was in bed semi-reclined, propped up with pillow to support airway, as she had low oxygen previously. Pt was also observed in dining room, eating lunch and socializing appropriately with peers.     On 3/17, OT approached pt in her room and introduced herself and her role. Pt agreed to meet with pt. Pt was engagable upon approach and was generally pleasant. During evaluation, several nurses and the psychiatrist came into pt's room briefly to talk to her. After this, pt remained open to completing evaluation. Pt asked if OT could complete a referral for in-house nursing assistance for ADLs. OT suggested that pt discuss with SW, as they focused on discharge planning and making referrals in the community. Pt became agitated and stated so you  can't do anything for me then. OT explained that OT typically supported with groups and developing coping skills. Pt was somewhat receptive to this. Pt was brushing hair and asked OT for assistance. OT assisted by brushing right side of hair forward, as pt could not reach. Pt then requested to complete evaluation at a later time, after she finished styling her hair. OT suggested meeting after lunch. Pt stated I don't care.    After approx. 2 hours, OT returned to meet with pt. Pt was sitting in dining room, and had just finished eating lunch. OT introduced herself again and pt agreed to complete evaluation.     During evaluation, pt was cooperative and calm. Thought content was organized. Speech rate and volume were normal. Limited eye contact. Appeared disheveled and was dressed appropriately (hospital gown). Pt was somewhat irritable, but was also laughing and smiling at appropriate times with OT.     Occupational Profile    The following occupational profile is based on 1:1 interview with pt and documentation.    Home context: Pt reports to this OT that she considers herself homeless, as she cannot live at her group home. Pt stated that group home staff were emotionally and verbally.    From [name] (3/7, Maybelle M.): Patient with many years of housing instability. Most recently lived in a group home, was living in a respite house prior, and prior to that was living on the streets in [place] and [place]. She is understandably very upset and anxious about this situation and where she will live  after this hospitalization. She reports she does not feel safe being discharged back to the same group home where she was before.    From Narrator Note (4/7, Marchia MeiersMei L.): Spoke with Dahlia ClientHannah, program nurse, states pt ran away from group home on 11/15/21, was found by [place] [employer] on 11/19/21, [facility] and d/c yesterday. Per Dahlia ClientHannah, pt stated she didn't want to go back to group home after discharge yesterday.    Social supports:  Pt reports no formal or informal social supports. According to [name] (3/7, Mayvbelle M.), pt has an OP NP who prescribes psychopharm. Pt reports that a community agency supports her with laundry every 2 weeks. She was unable to provide a name of agency or specifics about program. Pt also receives support from group home staff for medication.    ADLs/IADLs: Pt states she has difficulty with basic ADL due to physical limitations. Difficulty with sleep due to low O2 levels. Pt bathes using a wash cloth next to the sink. Pt required some support from OT to style her hair, as she was unable to reach L side of her head. According to Physician Discharge Summary (3/16, Gardiner BarefootCaroline G.), pt has been completing ADLs independently while in hospital. Pt ambulating with 4-wheel rollator w/ supervision (see Progress Note, Nadyne CoombesSandra L. PTA, 3/16). Pt reports cooking at home and that she enjoys cooking. Pt reports that she used to enjoy shopping, but is no longer able to shop due to running out of breath. States she often spends time indoors. Pt reports that she receives money through SSI and SSA (from father's work) and that she is able to pay her bills. Per collateral from Tonia, Clinical Director, Group [place] in [place] (see SW Progress Note, 3/17), pt is unable to pay bills for housing and has difficulty with financial management.       12/03/21 1616   IADL / ADL   IADL/ADL   (See occupational profile)   Daily Functioning   Coping Skills Listening to music;Unable to identify healthy coping skills;Identifies/engages in unhealthy coping skills  (Hx substance use. Cannabis, cocaine, fentanyl, buprenorphine, alcohol.)   Interpersonal Cooperative;Engagable upon approach;Irritable   Leisure/Interests Cooking;Lacks positive social supports/resources;Listening to music;Shopping;Watching TV/movies  (Previously enjoyed shopping, but is unable to do so now due to physical limitations.)   Responsibilities/Structures Has no current day  structure;Household chores;Unable to identify responsibilities/structure   Strengths Asks for help/support     ;Determined;Tax adviserHonest   Sensory Preferences   Sensory Preferences Auditory   Auditory Enjoys listening to music, sound of rain, thunderstorm, beach   Cognition   Decision Making Impaired   Follows Commands WFL   Frustration Tolerance Impaired   Problem Solving X   Judgement X   Insight Fair   Impulse Control Impaired   Orientation Level Grossly Intact   Processing [facility]   Perseveration Not present   Emotional Regulation Impaired   Coping/Life Skills Impaired   Risk Areas   Risk Areas Death;Financial challenges;History of hospitalization;Suicidality;Lack of support(s);Losses ;Non-adherence with treatment/medication        ;Trauma history (physical/emotional/sexual)   ;Substance abuse  (Son was murdered and she endorses [facility] of son. [employer] for)       Assessment: Pt has protective factors of housing in a supportive environment (group home). Pt self-endorses strengths of honesty, asking for help and determination. Pt was receptive to validation and was redirectable. Pt appears to be limited by suicidality, substance use, difficulty with mood/emotion regulation, limited coping skills, no informal social support,  and discontent with housing environment, all of which are likely contributing to current mental health decompensation. Pt would benefit from individualized and/or group-based skilled occupational therapy services in order to maximize participation, independence, and skill development across ADL/IADL, social/leisure, rest/sleep, work, and Development worker, international aid occupations.    OT Goals  STG: By 3/31, during OT group or individual sessions, Pt will independently verbalize 2-4 sensory- and/or activity-based mood/emotion regulation strategies that are generalizable across contexts, in order to promote independence in mental health self-management.     LTG: By 4/14, during OT group or individual sessions, Pt will  independently demonstrate use of 2-4 sensory- and/or activity-based mood/emotion regulation strategies that are generalizable across contexts, in order to promote independence in mental health self-management.      Plan: Encourage Pt to attend occupational therapy groups, and offer individual services as needed. Plan to include psycho-education as component of provided interventions. Educational and group/session topics to be included are ADL/IADL performance, sensory relaxation, leisure/social participation, impulse control, rest and sleep, self-regulation, and health/symptom management.      Arva Chafe, [place], [place] # [id number]

## 2021-12-03 NOTE — H&P (Signed)
Admission Medical Assessment    Patient was transferred from the [facility] medical service on 3/16.  Please see the medical H&P and discharge summary for additional details.    Chief Complaint:   Reason for Psychiatric Admission: Suicidal Ideation  Primary Medical Complaint: None. Just confused as to where she is and why she needs psychiatric care.    History of Present Illness:   Patient was admitted to the inpatient Psychiatry unit for management of suicidal ideation.  Seen for routine admission medical assessment.   Review of systems below.     Review of Systems: (Patient awakened from a deep sleep for the interview)  Reports feeling not herself and somewhat "spacey."  Some lightheadedness.  Mild headache.  Approximate baseline shortness of breath.  No new cough.  No chest pain, no abdominal pain, no lower extremity discomfort, no vomiting, no diarrhea, no dysuria.  All other systems reviewed and are negative.     Past Medical and Psychiatric History:   Schizoaffective disorder, bipolar type  PTSD  Anxiety  Grief reaction  History of suicide attempts  Polysubstance abuse  History of alcohol withdrawal  Diastolic CHF  Systolic congestive heart failure  COPD/asthma  Obesity  Seizure disorder  Complex pain syndrome  Tobacco use disorder  Right proximal humerus fracture  History of rectal bleeding  Lumbar spinal stenosis  Sciatica  Hepatitis C  Possible hypothyroidism  Atrial fibrillation  Gastroesophageal reflux disease  Obstructive Sleep Apnea (per patient)  Housing instability    Allergies and Intolerances:  Review of Patient's Allergies indicates:   Bee venom               Anaphylaxis   Carbamazepine           Hives   Methylprednisolone      Dizziness, Drowsiness    Comment:Also believes syncope. Seems to tolerate             inhalational and epidural steroid.   Nutritional supplem*       Hydroxyzine             Nausea Only       Vital Signs:   BP 130/74    Pulse 87    Temp 97.2 F (36.2 C) (Temporal)    Resp 20    Ht  5\' 9"  (1.753 m)    Wt (!) 145.2 kg (320 lb)    SpO2 90%    BMI 47.26 kg/m     Physical Examination:   General: sleeping deeply, snoring.   HEENT: No scleral icterus  Neck: no evident JVD  Lungs: clear, no wheezing  Cardiac: reg rhythm  Abdomen: Obese, normoactive bowel sounds, nondistended, nontender  Extremities: 2+ pitting edema bilat  Neurologic: Moving all extremities with symmetry.  Gait was relatively stable as observed in the hallway.  Psychiatric: The patient engaged politely and cooperatively with the interview and examination.  She did report feeling confused about where she was, how long she has been in the hospital, why she needs psychiatric care, and why she cannot just leave the hospital.    Recent test Results: reviewed and notable findings addressed in the Assessment and Plan.     Medications Prior to Admission: (on discharge from Medicine)  START taking these medications    aspirin 81 MG chewable tablet  Take 1 tablet by mouth in the morning.    doxepin (SINEQUAN) 10 MG capsule  Take 1 capsule by mouth nightly    ferrous sulfate 324 (65 Fe)  MG TBEC EC tablet  Take 1 tablet by mouth every other day    LORazepam (ATIVAN) 1 MG tablet  Take 1 tablet by mouth 2 (two) times daily as needed    melatonin 3 MG TABS tablet  Take 1 tablet by mouth nightly    naproxen (NAPROSYN) 250 MG tablet  Take 1 tablet by mouth 2 (two) times daily as needed for Pain    nicotine (NICODERM CQ) 21 MG/24HR  Place 1 patch onto the skin in the morning.    nicotine polacrilex (NICORETTE) 2 MG gum  Take 1 each by mouth every hour as needed for Craving (Nicotine)    prazosin (MINIPRESS) 2 MG capsule  Take 1 capsule by mouth nightly    sennosides (SENOKOT) 8.6 MG tablet  Take 2 tablets by mouth nightly as needed for Constipation (secondline)    CONTINUE these medications which have CHANGED    budesonide-formoterol (SYMBICORT) 160-4.5 MCG/ACT inhaler  Inhale 2 puffs into the lungs in the morning and 2 puffs before  bedtime.    buprenorphine-naloxone (SUBOXONE) 8-2 MG sublingual tablet  Place 1 tablet under the tongue in the morning and 1 tablet at noon and 1 tablet before bedtime. Max Daily Amount: 3 tablets.    diclofenac (VOLTAREN) 1 % GEL Gel  Apply 4 g topically 4 (four) times daily as needed (knee pain)    DULoxetine 40 MG CPEP  Take 40 mg by mouth daily    haloperidol (HALDOL) 5 MG tablet  Take 1 tablet by mouth in the morning and 1 tablet at noon and 1 tablet before bedtime.    QUEtiapine (SEROQUEL) 50 MG tablet  Take 1 tablet by mouth every 6 (six) hours as needed    torsemide (DEMADEX) 20 MG tablet  Take 3 tablets by mouth in the morning and 3 tablets before bedtime.    CONTINUE these medications which have NOT CHANGED    acamprosate (CAMPRAL) 333 MG tablet  Take 666 mg by mouth in the morning and 666 mg at noon and 666 mg before bedtime.    methocarbamol (ROBAXIN) 750 MG tablet  Take 750 mg by mouth in the morning and 750 mg at noon and 750 mg in the evening. Take before meals.    fluticasone (FLONASE) 50 MCG/ACT nasal spray  1 spray by Each Nostril route 2 (two) times daily    ferrous gluconate (FERGON) 324 MG tablet  Take 324 mg by mouth 3 (three) times a week Monday, Wednesday, Friday    atorvastatin (LIPITOR) 80 MG tablet  Take 80 mg by mouth nightly    gabapentin (NEURONTIN) 300 MG capsule  Take 600 mg by mouth in the morning and 600 mg at noon and 600 mg before bedtime.    docusate sodium (COLACE) 100 MG capsule  Take 100 mg by mouth daily as needed for Constipation    EPINEPHrine (EPIPEN JR 1:2000) 0.15 MG/0.15ML injection  Inject 0.15 mg into the muscle as needed    nystatin (MYCOSTATIN) cream  Apply topically 2 (two) times daily as needed (Rash, redness)    nystatin (MYCOSTATIN) powder  Apply topically 2 (two) times daily as needed (Rash, redness)    albuterol HFA 108 (90 Base) MCG/ACT inhaler  Inhale 2 puffs into the lungs every 4 (four) hours as needed for Wheezing or Shortness of  breath    ondansetron (ZOFRAN-ODT) 4 MG disintegrating tablet  Take 4 mg by mouth every 8 (eight) hours as needed for Nausea    levothyroxine (  SYNTHROID) 25 MCG tablet  Take 25 mcg by mouth every morning before breakfast    loratadine (CLARITIN) 10 MG tablet  Take 10 mg by mouth in the morning.    omeprazole (PRILOSEC) 40 MG capsule  Take 40 mg by mouth in the morning.    OXcarbazepine (TRILEPTAL) 150 MG tablet  Take 150 mg by mouth in the morning and 150 mg before bedtime.    potassium chloride SA (K-DUR) 20 MEQ tablet  Take 20 mEq by mouth in the morning and 20 mEq before bedtime.    levETIRAcetam (KEPPRA) 750 MG tablet  Take 750 mg by mouth in the morning and 750 mg before bedtime.    thiamine (VITAMIN B-1) 100 MG tablet  Take 1 tablet by mouth daily.  Qty: 30 tablet Refills: 12    STOP taking these medications    doxepin (SINEQUAN) 10 MG/ML solution    potassium chloride (KLOR-CON) 20 MEQ packet    aspirin 81 MG EC tablet    ibuprofen (ADVIL,MOTRIN) 400 MG tablet  -------------------------------------------------------------------------------------------    Current Inpatient Medications: reviewed.    torsemide  80 mg Oral BID    FLUoxetine  20 mg Oral Daily    loxapine  25 mg Oral BID    acamprosate  666 mg Oral TID    aspirin  81 mg Oral Daily    atorvastatin  80 mg Oral Nightly    budesonide-formoterol  2 puff Inhalation BID    buprenorphine-naloxone  8 mg Sublingual TID    ferrous gluconate  324 mg Oral Once per day on Mon Wed Fri    fluticasone  1 spray Each Nostril BID    gabapentin  600 mg Oral TID    prazosin  2 mg Oral Nightly    potassium chloride SA  20 mEq Oral BID    OXcarbazepine  150 mg Oral BID    pantoprazole  40 mg Oral Daily    nicotine  1 patch Transdermal Daily    levETIRAcetam  750 mg Oral BID    levothyroxine  25 mcg Oral DAILY    melatonin  3 mg Oral Nightly    methocarbamol  750 mg Oral TID AC    thiamine  100 mg Oral Daily       PRN: haloperidol,  LORazepam, acetaminophen, magnesium hydroxide, aluminum-magnesium hydroxide-simethicone, albuterol HFA, diclofenac, docusate sodium, sennosides, nicotine polacrilex, naproxen, eucerin, nystatin      Assessment and Plan:   51 year old female with schizoaffective disorder admitted to the inpatient Psychiatry unit for management of suicidal ideation. Seen for routine admission medical assessment. Issues below.     Suicidal ideation  Schizoaffective disorder, bipolar type  PTSD  Anxiety  Grief reaction  History of suicide attempts:  -Current regimen: Fluoxetine, loxapine, gabapentin, prazosin, melatonin, and as needed lorazepam, haloperidol.  - Ongoing care by the psychiatry team    Polysubstance abuse  History of opioid dependence  History of alcohol withdrawal: Not scoring on CIWA.  - Acamprosate  - Buprenorphine-naloxone  - Sobriety support  - Plan ongoing pharmacologic treatment and behavior modification treatment connections at discharge    Acute on chronic diastolic congestive heart failure  COPD/asthma  Obesity  Obstructive Sleep Apnea  Chronic dyspnea  Chronic hypoxia: On the medical service, the patient appeared to accumulate fluid while on a reduced dose of baseline diuretic.  Enhanced diuresis was provided and weight gain appeared to resolve.  Overall intake and output was noted fairly even per the recordings.  The  patient also reports carrying a diagnosis of sleep apnea but no history of CPAP use.  Subjectively, she is at her baseline respiratory status.  Recorded oxygen saturations on the psychiatry unit have been as low as the lower 80s, but trending higher 80s to lower 90s most recently.  She does appear to tolerate this degree of hypoxia and is probably a reasonable target zone for her given the underlying COPD, chronic hypoxia, obesity, and risk of CO2 retention from overly generous supplemental oxygenation.  The ligature and other risks associated with the mechanics of supplemental oxygen would further  support no such intervention at this time.  - Budesonide-formoterol  - Fluticasone nasal spray  - Review and consider the addition of a long-acting antimuscarinic agent for COPD maintenance regimen  - Torsemide 80 mg twice per day  - Nicotine replacement therapy and smoking cessation support  - Rescue albuterol as needed  - Weight reduction strategies as able  - Further review of the sleep apnea diagnosis and management options.  Likely will need a formal outpatient sleep study  - Recommend outpatient Pulmonology follow    Hypokalemia: Potassium 3.2 in the morning of 3/16.  Magnesium was 2.  This is in the context of high-dose diuretics.  - Continue twice daily supplementation  - Recheck potassium trend if able (I will attempt to add onto today's blood already drawn)    Hypertriglyceridemia: Triglycerides 290 today.  - Lipid restricted diet (I will modify current diet)  - Atorvastatin  -Low-dose aspirin    Seizure disorder: Continue levetiracetam, gabapentin, oxcarbazepine    Complex chronic pain syndrome  Lumbar spinal stenosis  Sciatica:  - Buprenorphine-naloxone  - Fluoxetine  - Gabapentin  - Methocarbamol  - Maintain activity as able  - Weight reduction strategies as able  - Psychiatric care    Tobacco use disorder:  - Transdermal and trans buccal nicotine as needed  - At discharge, consider ongoing pharmacologic treatment and connection with behavioral modification program    Hepatitis C:? activity.  - Review diagnostic and treatment history  - Check current viral load (I will request add-on)    Hypothyroidism: History of levothyroxine use.  TSH normal on 11/24/2021.  - Levothyroxine    Atrial fibrillation: ? If paroxysmal or transient.  Regular rhythm by auscultation.  Not on negative chronotropes.  - Low-dose aspirin  - Periodic follow of rhythm  - If tachycardia should develop, plan electrocardiogram    Gastroesophageal reflux disease:  -Pantoprazole  - Antireflux hygiene    Bee Venom Allergy: History of  anaphylaxis  - At discharge, if not already possessed, the patient should be provided with a prescription for epinephrine pen and provided with teaching    Housing instability: Reviewed status and options    DVT Prophylaxis: ambulation    Code Status: Full    The patient appears medically appropriate to receive care on the inpatient Psychiatry unit.     Disposition: to be determined per progress of psychiatric care needs.     Level of care: Inpatient      Diona Browner, MD  Pager [phone]  24/7 Hospitalist Pager [phone]

## 2021-12-03 NOTE — Progress Notes (Addendum)
Insurance/Policy #: [id number]   Inpatient Admission Date: 12/02/21    Authorization #:[id number]  Person giving Authorization:Kara F/[facility]  Phone #: [phone]   # of days authorized: 4  Bed Status:HLOC  Last covered day : 12/06/21  Date of next review: 12/06/21  Additional notes:Precert above obtained from registration.Reached out to [employer] and confirmed all concurrent reviews should be called into [facility] at [phone]

## 2021-12-03 NOTE — Plan of Care (Signed)
Problem: Mood-related symptoms  Goal: Reduction in Intensity and Frequency (Mood)  Intervention: (Rehab) Identify Alternative Programming  Description: Identify alternative programming when patient unable to attend groups-OT will at a minimum of once a week encourage pt to utilize sensory cart items and to utilize safe coping skills to assist with symptom management, emotional regulation, and self soothing when not in groups  Pt will be oriented to sensory cart within 3 days of admission   Note: OT Individualized Active Treatment Session Note:  Orientation to Sensory Cart  S:    "I will take a notebook"     O:    Pt was oriented to the items in Sensory Closet. The purpose of these sensory resources was explained to the patient and pt selected  at this time. Pt was informed to seek out any staff member if needs any additional sensory items throughout this hospital stay to assist with self-regulation, arousal, or participation in activities.     A:    Pt was receptive to this concept and appeared to understand the purpose and benefits of various sensory resources, and how to access them on the unit.      P:    Continue to offer pt support and techniques to assist with self-regulation, arousal, and participation. Encourage pt to utilize items that pt found effective. Continue to work on above stated skills in individual sessions as needed, and in groups as indicated by OT plan of care. Provide any other supplies/material that may assist pt with individualized goals.

## 2021-12-03 NOTE — Progress Notes (Signed)
PROGRESS NOTE - Social Work    Clinical:  Chart review completed. Pt discussed with treatment team.   SW briefly met with pt who was laying in bed on a tablet.  Pt request SW call the group home to have them bring her belongings.  Pt does not want to return to the [facility] and wants to go to a homeless shelter in [place].   Collateral:  Group Home, [phone]:  [name] Collateral from [place], Jarrett Soho: who reported that patient moved to the group home in november of 2022, she does not like the group home because they are obligated to pay rents at the program. She states that every end of the month, when it is time to pay rent patient will go into the hospital. She states that patient was recently at [facility] on 2/7, and she was discharged and she did not come home and a missing person report was filed with the [place] police. She presented to [facility] on 11/19/21 and she was discharged yesterday 11/22/2021. She lastly saw her Psychiatrist on 11/02/2021. She faxed over patient's complete med list.    3/17: Lauro Regulus, Clinical Director, [facility] in [place]: Last time she was hospitalized pt asked for belongings and she then elopes from group home immediately following discharge.  Pt typically stays at the group home 2 weeks out of the month.  When it is time to pay rent and pt gets paid for the month, she elopes, spends her money and then returns to the program or a hospital.  Pt will then be out of money and unable to pay rent. Pt has been avoiding communication with [place]. The [facility] is hopeful to have a Meeting with inpatient team and pt to discuss pt returning and not eloping each month . Pt does need to reside in a specialized program because of medical issues. Lauro Regulus believes pt could benefit from prolonged exposure therapy. Pt has a recovery coach and psychiatrist but is avoiding accessing the supports. Pt as a lot of medication changes in the past year but pt does not give the medication time to get to therapeutic doses  before requesting changes.     [facility] will not be bringing pt clothing to the unit at this time until a meeting is held with the treatment team, due to fear of pt eloping after discharge.     Lorenso Quarry, Psych NP  [name] collater from outpatient prescriber: who states that the patient is new to her, she saw her once on 11/02/21, and had reported of VH seeing images of her son who was murdered. She had increased her Abilify to 15 mg daily. She said patient had no safety concern at the time but did report prior SA, and multiple in patient hospitalization. She states that a few after she saw the patient, the program director informed her that the pt had presented to the ED with reports of SI. Then there was a missing person report filed on   Barriers To Discharge:  Suicide attempt  Substance abuse       Aftercare Planning:  Schedule appointments    Lorenso Quarry, NP, [phone]  Therapist?  PCP?    Coordinate with group home, [phone]

## 2021-12-03 NOTE — Group Note (Signed)
OT Psych Group Documentation    Topics: Self-Confidence    Categories of Occupation: Social Participation, Leisure    This group addresses a variety of skills, including attention, social interaction, cognitive flexibility/problem solving, self-efficacy, executive function, self-regulation, communication, and fine motor coordination. These skills are important in improving performance and independence in valued activities, occupations, and roles.    Attendees participated in discussion prompts pertaining to self-confidence and values. Attendees then completed a self-awareness/self-esteem craft comprised of words of affirmation and positive traits about themselves.                   Date of group: 12/03/2021  Start Time: [id number]  End Time: [id number]    Attendance: did not attend (meeting with team in her room)    Racheal Patches, [place]

## 2021-12-03 NOTE — RN Shift Note (Signed)
1500-2330  Pt was seclusive to her mostly this evening. She came out of her room to eat and take her evening meds. Pt complained of back pain for which Naproxen was administered at 2002 with positive effect. She also asked for a hot pack for the pain in her back. This was given- positive effect reported. Pt stated that her appetite and sleep were poor. Pt was observed to be sleeping in her room most of the evening. Pt did not endorse any SI/HI. No auditory hallucinations reported. Pt reported having visual hallucinations. Mood- calm, irritable at times, Affect- congruent with mood, Behavior-cooperative. Pt stated she would take a shower tomorrow. No signs of respiratory distress. No SOB. Spo2 at 5 pm- 88%, at 9 pm- 89%. Pt took all her scheduled meds. Nicorette gum given at pt's request. Q 15 minute checks maintained for safety.

## 2021-12-03 NOTE — Initial Assessments (Signed)
PSYCH ADMISSION NOTE        Identifying Data:  [name] that was admitted on 12/02/21    with Patient Active Problem List:     Chest pain     Psychiatric disorder     SOB (shortness of breath)     Hypoxia     Suicidal behavior with attempted self-injury ([place])     Suicidal ideations     Tobacco use     Systolic congestive heart failure ([name])     Seizure disorder ([name])     Substance dependence ([place])     Schizoaffective disorder, depressive type ([place])      Source of Information: patient, chart    Chief Complaint: hallucinations of deceased son's appearance and voice; thoughts of suicide     History of Present Illness (include acute symptoms, precipitants, changes in  functional status, and level of distress): Cheryl Zimmerman came to the hospital on 03/07 complaining of thoughts of suicide associated with grief for her son Niger, who died at 48 yo of GSW two years ago.  Recently D's been hallucinating son's voice and image; this was better with the help of haloperidol for a while, but now happens many times a day and includes son's urges for D to join him in death, prompting her to contemplate suicide.  Also, she's unhappy with the [facility] group home where she lives, because one of the workers there is mean to her and the other residents, and the other residents aren't good company because they seem to be slower mentally than D.  She feels unloved, which also prompts her to contemplate suicide.  In the ED on 03/08, she deliberately wrapped O2 tubing around her neck in full view of observer; D figures she did that because she needed love, but she can't say what she thought it would accomplish, and she wouldn't do it again.    Between ED presentation and now, she was admitted to the general medical floor for hypoxia, which improved with diuresis to its baseline 88-94% SaO2.  She was followed by psychiatric consultant then and now presents for a voluntary psychiatric admission.  She doesn't think her drug problem  is an active issue now.      Active Medical Problems:     Tobacco use         Date Noted: 12/02/2021       Chronic systolic CHF      Psychiatric History:  Diagnostic History: treatment since 51 yo; schizoaffective, polysubstance d/o including opioid, cocaine  History of Psychiatric Hospitalizations (include dates and locations): yes  Current/Most Recent Outpatient Care: [facility]  Safety Concerns: as in HPI  Medication/Treatment Trials: good response to thioridazine, generally prefers first-generation antipsychotics; not much response to CPZ 400 mg; quetiapine complicated by weight gain; fluoxetine was best antidepressant    Family History:  No family history on file.      Family History of Mental Illness or Substance Abuse:yes    Substance Abuse:  Substance Use Screen  * Used chemicals (other than as prescribed) in the past 12 months?: Yes  Substance 1  Type of Other Substances Used: Marijuana   UDS (+) fentanyl, (+) cocaine        Alcohol  * Alcohol Use in past 12 months: Yes    C-SSRS:    Default Flowsheet Data (most recent)     C-SSRS Direct Admit Screener - 12/02/21 1800        C-SSRS  Direct  Admit Triage Screener    Within the past month, have you wished you were dead or wished you could go to sleep and not wake up?  Yes     Within the past month, have you had any actual thoughts of killing yourself?  Yes     Within the past month, have you been thinking about how you might do this?  Yes     Within the past month, have you had these thoughts and had some intention of acting on them?  Yes     Within the past month, have you started to work out or worked out the details of how to kill yourself? Do you intend to carry out this plan?  Yes     In your lifetime, have you ever done anything, started to do anything, or prepared to do anything to end your life? Yes     Was this within the past 3 months?  Yes     C-SSRS Direct Admit Screener Risk Score High Risk        Unable to complete Direct Admit Screener?    Unable  to assess reason (Direct Admit Screener) --   n/a                    Pertinent Past Medical/Surgical History:  No date: Chronic systolic CHF (congestive heart failure) ([name])    No past surgical history on file.    Allergies  Review of Patient's Allergies indicates:   Carbamazepine           Hives        Pre-Admission Medications:  Prior to Admission Medications   Prescriptions Last Dose Informant Patient Reported? Taking?   DULoxetine (CYMBALTA) 60 MG capsule   Yes No   Sig: Take 60 mg by mouth in the morning.   DULoxetine 40 MG CPEP   No No   Sig: Take 40 mg by mouth daily   EPINEPHrine (EPIPEN JR 1:2000) 0.15 MG/0.15ML injection   Yes No   Sig: Inject 0.15 mg into the muscle as needed   LORazepam (ATIVAN) 1 MG tablet   No No   Sig: Take 1 tablet by mouth 2 (two) times daily as needed   OXcarbazepine (TRILEPTAL) 150 MG tablet   Yes No   Sig: Take 150 mg by mouth in the morning and 150 mg before bedtime.   QUEtiapine (SEROQUEL) 50 MG tablet   Yes No   Sig: Take 50 mg by mouth in the morning and 50 mg before bedtime. One tablet every 6 hours as needed for racing thoughts.   QUEtiapine (SEROQUEL) 50 MG tablet   No No   Sig: Take 1 tablet by mouth every 6 (six) hours as needed   acamprosate (CAMPRAL) 333 MG tablet   Yes No   Sig: Take 666 mg by mouth in the morning and 666 mg at noon and 666 mg before bedtime.   albuterol HFA 108 (90 Base) MCG/ACT inhaler   Yes No   Sig: Inhale 2 puffs into the lungs every 4 (four) hours as needed for Wheezing or Shortness of breath   aspirin 81 MG EC tablet   Yes No   Sig: Take 81 mg by mouth in the morning.   aspirin 81 MG chewable tablet   No No   Sig: Take 1 tablet by mouth in the morning.   atorvastatin (LIPITOR) 80 MG tablet   Yes No   Sig: Take 80 mg by  mouth nightly   budesonide-formoterol (SYMBICORT) 160-4.5 MCG/ACT inhaler   Yes No   Sig: Inhale 2 puffs into the lungs 2 (two) times daily   budesonide-formoterol (SYMBICORT) 160-4.5 MCG/ACT inhaler   No No   Sig: Inhale 2 puffs  into the lungs in the morning and 2 puffs before bedtime.   buprenorphine-naloxone (SUBOXONE) 8-2 MG sublingual tablet   Yes No   Sig: Place 1 tablet under the tongue in the morning and 1 tablet at noon and 1 tablet in the evening. Place before meals.   buprenorphine-naloxone (SUBOXONE) 8-2 MG sublingual tablet   No No   Sig: Place 1 tablet under the tongue in the morning and 1 tablet at noon and 1 tablet before bedtime. Max Daily Amount: 3 tablets.   diclofenac (QC DICLOFENAC SODIUM) 1 % GEL Gel   Yes No   Sig: Apply 4 g topically 2 (two) times daily as needed   diclofenac (VOLTAREN) 1 % GEL Gel   No No   Sig: Apply 4 g topically 4 (four) times daily as needed (knee pain)   docusate sodium (COLACE) 100 MG capsule   Yes No   Sig: Take 100 mg by mouth daily as needed for Constipation   doxepin (SINEQUAN) 10 MG capsule   No No   Sig: Take 1 capsule by mouth nightly   doxepin (SINEQUAN) 10 MG/ML solution   Yes No   Sig: Take 10 mg by mouth nightly   ferrous gluconate (FERGON) 324 MG tablet   Yes No   Sig: Take 324 mg by mouth 3 (three) times a week Monday, Wednesday, Friday   ferrous sulfate 324 (65 Fe) MG TBEC EC tablet   No No   Sig: Take 1 tablet by mouth every other day   fluticasone (FLONASE) 50 MCG/ACT nasal spray   Yes No   Sig: 1 spray by Each Nostril route 2 (two) times daily   gabapentin (NEURONTIN) 300 MG capsule   Yes No   Sig: Take 600 mg by mouth in the morning and 600 mg at noon and 600 mg before bedtime.   haloperidol (HALDOL) 5 MG tablet   Yes No   Sig: Take 5 mg by mouth Daily after dinner Daily at bedtime   haloperidol (HALDOL) 5 MG tablet   No No   Sig: Take 1 tablet by mouth in the morning and 1 tablet at noon and 1 tablet before bedtime.   ibuprofen (ADVIL,MOTRIN) 400 MG tablet   No No   Sig: Take 1 tablet by mouth every 4 (four) hours as needed for Pain.   Patient taking differently: Take 400 mg by mouth every 8 (eight) hours as needed for Pain   levETIRAcetam (KEPPRA) 750 MG tablet   Yes No    Sig: Take 750 mg by mouth in the morning and 750 mg before bedtime.   levothyroxine (SYNTHROID) 25 MCG tablet   Yes No   Sig: Take 25 mcg by mouth every morning before breakfast   loratadine (CLARITIN) 10 MG tablet   Yes No   Sig: Take 10 mg by mouth in the morning.   melatonin 3 MG TABS tablet   No No   Sig: Take 1 tablet by mouth nightly   methocarbamol (ROBAXIN) 750 MG tablet   Yes No   Sig: Take 750 mg by mouth in the morning and 750 mg at noon and 750 mg in the evening. Take before meals.   naproxen (NAPROSYN) 250 MG tablet  No No   Sig: Take 1 tablet by mouth 2 (two) times daily as needed for Pain   nicotine (NICODERM CQ) 21 MG/24HR   No No   Sig: Place 1 patch onto the skin in the morning.   nicotine polacrilex (NICORETTE) 2 MG gum   No No   Sig: Take 1 each by mouth every hour as needed for Craving (Nicotine)   nystatin (MYCOSTATIN) cream   Yes No   Sig: Apply topically 2 (two) times daily as needed (Rash, redness)   nystatin (MYCOSTATIN) powder   Yes No   Sig: Apply topically 2 (two) times daily as needed (Rash, redness)   omeprazole (PRILOSEC) 40 MG capsule   Yes No   Sig: Take 40 mg by mouth in the morning.   ondansetron (ZOFRAN-ODT) 4 MG disintegrating tablet   Yes No   Sig: Take 4 mg by mouth every 8 (eight) hours as needed for Nausea   potassium chloride (KLOR-CON) 20 MEQ packet   Yes No   Sig: Take 20 mEq by mouth every morning   potassium chloride SA (K-DUR) 20 MEQ tablet   Yes No   Sig: Take 20 mEq by mouth in the morning and 20 mEq before bedtime.   prazosin (MINIPRESS) 2 MG capsule   No No   Sig: Take 1 capsule by mouth nightly   quetiapine (SEROQUEL) 200 MG tablet   Yes No   Sig: Take 200 mg by mouth 2 (two) times daily.   sennosides (SENOKOT) 8.6 MG tablet   No No   Sig: Take 2 tablets by mouth nightly as needed for Constipation (secondline)   thiamine (VITAMIN B-1) 100 MG tablet   No No   Sig: Take 1 tablet by mouth daily.   torsemide (DEMADEX) 20 MG tablet   Yes No   Sig: Take 40 mg by  mouth in the morning and 40 mg before bedtime.   torsemide (DEMADEX) 20 MG tablet   No No   Sig: Take 3 tablets by mouth in the morning and 3 tablets before bedtime.      Facility-Administered Medications: None         Scheduled Medications:   torsemide  80 mg Oral BID    FLUoxetine  20 mg Oral Daily    loxapine  25 mg Oral BID    acamprosate  666 mg Oral TID    aspirin  81 mg Oral Daily    atorvastatin  80 mg Oral Nightly    budesonide-formoterol  2 puff Inhalation BID    buprenorphine-naloxone  8 mg Sublingual TID    ferrous gluconate  324 mg Oral Once per day on Mon Wed Fri    fluticasone  1 spray Each Nostril BID    gabapentin  600 mg Oral TID    prazosin  2 mg Oral Nightly    potassium chloride SA  20 mEq Oral BID    OXcarbazepine  150 mg Oral BID    pantoprazole  40 mg Oral Daily    nicotine  1 patch Transdermal Daily    levETIRAcetam  750 mg Oral BID    levothyroxine  25 mcg Oral DAILY    melatonin  3 mg Oral Nightly    methocarbamol  750 mg Oral TID AC    thiamine  100 mg Oral Daily       PRN Medications:  Current Facility-Administered Medications   Medication Dose Route Frequency Last Admin    haloperidol  5  mg Oral Q4H PRN      LORazepam  2 mg Oral Q4H PRN 2 mg at 12/03/21 1340    acetaminophen  650 mg Oral Q4H PRN 650 mg at 12/02/21 2237    magnesium hydroxide  30 mL Oral Daily PRN      aluminum-magnesium hydroxide-simethicone  30 mL Oral Q2H PRN      albuterol HFA  2 puff Inhalation Q4H PRN      diclofenac  4 g Topical 4x Daily PRN      docusate sodium  100 mg Oral Daily PRN      sennosides  17.2 mg Oral Nightly PRN      nicotine polacrilex  2 mg Oral Q1H PRN 2 mg at 12/03/21 1340    naproxen  250 mg Oral BID PRN 250 mg at 12/02/21 2017    eucerin   Topical Q12H PRN Given at 12/02/21 2019    nystatin   Topical BID PRN         Psychosocial History:       As in HPI, lives in [facility] group home, grieving son.      Legal Decision Maker:    Mental Status Exam:   Appearance:  obese, poor dentition, intact grooming  Motor behavior: no abnormalities  Attitude: pleasant  Speech: normal rate, volume, prosody  Mood: "mourning"  Affect: full, bright, responsive  Thought process: coherent  Thought content: grief re son's death; discontent with group home; morbid reunion fantasy but no intent of suicide  Perception: son's voice asking "mommy why are you still here?"  Insight: fair  Judgment:c/w insight   Impulse control: intact  Cognition: intact    Vitals:  BP 130/74    Pulse 87    Temp 97.2 F (36.2 C) (Temporal)    Resp 20    Ht 5\' 9"  (1.753 m)    Wt (!) 145.2 kg (320 lb)    SpO2 90%    BMI 47.26 kg/m     Physical Exam:  Per medicine admission.        Risk Assessment:  Violence Risk  Current Attempt to Harm: No      DSM-V    Primary Diagnosis: Schizoaffective disorder, depressive type ([place])                 Treatment Recommendations/Rationale for Recommended Level of Care:   Discontinue haloperidol as pt feels it has lost efficacy and she doesn't want dose increase.    Start loxapine 25 mg bid to replace haloperidol as antipsychotic.  Patient consents.    Discontinue duloxetine, replace with pt's preferred fluoxetine, 20 mg daily.  Patient consents.    Discontinue doxepin, which is redundant and whose metabolism will be slowed when fluoxetine is added.    Continue suboxone 8/2 mg tid, acamprosate 666 mg tid.    Continue antiepileptic regimen, diuretic, respiratory meds per internal medicine recommendations  Observation q 15 minutes        I have reviewed all above information with patient.

## 2021-12-03 NOTE — Group Note (Signed)
OT Psych Group Documentation    Topics: [employer]          Date of group: 12/03/2021  Start Time: 1500  End Time: 1630    Attendance: Declined  Comments: declined group, initially in room, later seen in hallway requesting gingerale and returned back to room.     Pasty Arch, OT [phone]

## 2021-12-03 NOTE — Plan of Care (Signed)
[name] will cooperate with assessment, treatment, and milieu rules and report decrease in hallucinations and absence of suicidal thoughts within six days.

## 2021-12-03 NOTE — Plan of Care (Signed)
"  I want my brush and hair spray!" Pt was hypoxic this morning complaining of dizziness, difficulty breathing, heaviness on her chest. "pressure" that staff cannot understand. Pt O2 Sat was 80-83% RA at 7am. Pt was asked to sit and her O2 continued to be 83-84% RA at 730AM. Pt had breakfast and her medications including inhalers and continued to be at 83-84% at 8AM. [facility] was paged, but TW was informed that attending will have to take care of the situation. Pt was redirected to her room and asked to stay sitting. O2 was 82-86% RA in her room. Pt became irritable. Attending was notified at the door upon arrival and came to assess pt in her room. Pt O2 came up to 88-91% with sitting up with attending. Pt continued to perseverate over her hairspray and brush. Pt was verbally aggressive towards her 1:1 and TW. Hospitalist contacted and informed of situation as well. Pt O2 was 86-92% RA when talking with OT at 9:30AM. Medical Attending from medical floor who is more familiar with pt came to assess the patient at 10:40AM. Pt O2 was 92% RA upon assessment by Medical team. Medical team states the plan will be to give 80mg  of Torsemide now and continue 80mg  Torsemide BID. Pt will be on O2 Saturation assessments ordered for Q4 hours and as needed for symptoms. Daily labs for creatinine and magnesium.  Head of bed is ordered to be at 60 degrees. Wedge ordered from clean supply room and is attached to her bed. Pt 1300 O2 on RA is 95%. Pt watching movie . Pt took her afternoon meds. Continues to complain of overall discomfort. Pt had a large lunch. Reports anxiety, but 0/10 pain. Pt took PRN Ativan at 1330. Pt later took 1400 PRN Nicorette gum. Pt continues to be on 1:1 for continued feelings of being unsafe.

## 2021-12-04 MED ORDER — SENNOSIDES 8.6 MG PO TABS
17.2000 mg | ORAL_TABLET | Freq: Every evening | ORAL | Status: DC
Start: 2021-12-04 — End: 2021-12-05
  Administered 2021-12-04 – 2021-12-05 (×2): 17.2 mg via ORAL
  Filled 2021-12-04 (×2): qty 2

## 2021-12-04 MED ORDER — DOCUSATE SODIUM 100 MG PO CAPS
100.0000 mg | ORAL_CAPSULE | Freq: Two times a day (BID) | ORAL | Status: DC
Start: 2021-12-04 — End: 2021-12-05
  Administered 2021-12-04 – 2021-12-05 (×3): 100 mg via ORAL
  Filled 2021-12-04 (×3): qty 1

## 2021-12-04 NOTE — Progress Notes (Signed)
0700-1500  Problem: Risk for Self Injury/Neglect  Goal: Treatment Goal: Remain safe during length of stay, learn and adopt new coping skills, and be free of self-injurious ideation, impulses and acts at the time of discharge  Outcome: Progressing     At the start of this shift pt was received in milieu, utilizing rolling walker to ambulate around milieu. Pt endorsing poor sleep and poor mood. Pt endorsing multiple somaitc compliants, stating she is feeling her chest tighetening and difficultly breathing. O2 assessed multiple times, ranging from 86% to 92%, utilizing PRN albuterol with moderate effect. Appepite appears intact, pt eating complete portions of meals and additional snacks. Tended to ADLs independtly. Compliant with scheduled meds. Social with peers in milieu. Improved as shift continued in terms of physical compliants. No further behavioral or safety concerns to report at this time.

## 2021-12-04 NOTE — Group Note (Signed)
OT Psych Group Documentation:    Topics: [place] activity (Identifying strengths, challenges/stressors, goals, and supports)  Categories of occupation addressed: Leisure, social participation, health management  Skills addressed: insight, self-awareness, self-esteem                 Date of group: 12/04/2021  Start Time: 1300  End Time: 1400    Attendance: Decl[place] [id number]

## 2021-12-04 NOTE — Progress Notes (Signed)
1500-2300    Problem: Risk for Self Injury/Neglect  Goal: Treatment Goal: Remain safe during length of stay, learn and adopt new coping skills, and be free of self-injurious ideation, impulses and acts at the time of discharge  12/04/2021 2042 by Carmelia Bake, RN  Outcome: Progressing  12/04/2021 1209 by Carmelia Bake, RN  Outcome: Progressing    Pt continued to utilize rolling seating walker to ambulate around milieu, gait is steady but slow. On initial approach pt is pleasant and cooperative. Continues to endorse multiple somatic compliants but appears to be siginficantly improved through out shift. Frequently requesting snacks, minimally receptive with education to pick healthier options. Continuing to monitor HR/O2 q4 hours, appear to be within normal limits. Observed appropaitely socializing with peers through out shift in common room and utilizing tablet. Denies SI/HI/AH/VH. No behavioral or safety concerns to report at this time.     Compliant with scheduled HS meds. Appears to have fallen asleep shortly after HS meds. HOB up 60 degree per order.

## 2021-12-04 NOTE — Progress Notes (Signed)
Identifying Data:  [name] that was admitted on 12/02/21    with see below    Legal Status: Legal Status - Section 12    INTERVAL HISTORY:   Patient Active Problem List:     Chest pain     Psychiatric disorder     SOB (shortness of breath)     Hypoxia     Suicidal behavior with attempted self-injury ([name])     Suicidal ideations     Tobacco use     Systolic congestive heart failure ([name])     Seizure disorder ([name])     Substance dependence ([name])     Schizoaffective disorder, depressive type ([name])    Sitting up in her room.  Notes intermittent drowsiness and difficulty breathing.  Provider reviews recent increase in diuretic.  Medication compliance Yes, Sleep is excessive, Appetite is fair    VITALS:  BP 122/78    Pulse 109    Temp 97.7 F (36.5 C) (Temporal)    Resp 18    Ht 5\' 9"  (1.753 m)    Wt (!) 145.2 kg (320 lb)    SpO2 92%    BMI 47.26 kg/m    Pain Score: 2 (2/10)    Scheduled Meds:   docusate sodium  100 mg Oral BID    sennosides  17.2 mg Oral Nightly    torsemide  80 mg Oral BID    FLUoxetine  20 mg Oral Daily    loxapine  25 mg Oral BID    acamprosate  666 mg Oral TID    aspirin  81 mg Oral Daily    atorvastatin  80 mg Oral Nightly    budesonide-formoterol  2 puff Inhalation BID    buprenorphine-naloxone  8 mg Sublingual TID    ferrous gluconate  324 mg Oral Once per day on Mon Wed Fri    fluticasone  1 spray Each Nostril BID    gabapentin  600 mg Oral TID    prazosin  2 mg Oral Nightly    potassium chloride SA  20 mEq Oral BID    OXcarbazepine  150 mg Oral BID    pantoprazole  40 mg Oral Daily    nicotine  1 patch Transdermal Daily    levETIRAcetam  750 mg Oral BID    levothyroxine  25 mcg Oral DAILY    melatonin  3 mg Oral Nightly    methocarbamol  750 mg Oral TID AC    thiamine  100 mg Oral Daily       PRN Meds:  Current Facility-Administered Medications   Medication Dose Route Frequency Last Admin    haloperidol  5 mg Oral Q4H PRN      LORazepam  2 mg  Oral Q4H PRN 2 mg at 12/03/21 1340    acetaminophen  650 mg Oral Q4H PRN 650 mg at 12/02/21 2237    magnesium hydroxide  30 mL Oral Daily PRN      aluminum-magnesium hydroxide-simethicone  30 mL Oral Q2H PRN      albuterol HFA  2 puff Inhalation Q4H PRN 2 puff at 12/04/21 0932    diclofenac  4 g Topical 4x Daily PRN      nicotine polacrilex  2 mg Oral Q1H PRN 2 mg at 12/04/21 0522    naproxen  250 mg Oral BID PRN 250 mg at 12/04/21 0120    eucerin   Topical Q12H PRN Given at 12/02/21 2019  nystatin   Topical BID PRN         Mental Status Examination Notable Findings:  Mental Status Exam   General Appearance: Dressed appropriately;Dressed in hospital gown/johnny  Behavior: Cooperative  Level of Consciousness: Drowsy  Orientation Level: Grossly Intact  Attention/Concentration: WNL  Mannerisms/Movements: No abnormal mannerisms/movements  Speech Quality and Rate: WNL  Speech Clarity: Clear  Speech Tone: Normal vocal inflection  Vocabulary/Fund of Knowledge: WNL  Memory: Intact  Thought Process & Associations: Linear  Dissociative Symptoms: None  Thought Content: No abnormalities reported or observed  Delusions: None  Hallucinations: None  Suicidal Thoughts: None  Homicidal Thoughts: None  Mood: Depressed  Affect: Blunted  Judgment: Fair  Insight: Fair  Pleasant    C-SSRS:  Default Flowsheet Data (most recent)     C-SSRS Frequent Screener - 12/04/21 0932        C-SSRS Frequent Screener    Since last contact, have you actually had thoughts about killing yourself ?  No     Have you been thinking about how you might do this?  No     Have you had these thoughts and had some intention of acting on them?  No     Have you started to work out or worked out the details of how to kill yourself? Do you intend to carry out this plan? No     Have you done anything, started to do anything, or prepared to do anything to end your life?  No     C-SSRS Frequent Screener Risk Score No Risk                 Default Flowsheet Data  (most recent)     C-SSRS Risk Assessment - 12/03/21 1454        C-SSRS Risk Assessment    Suicidal and Self-Injurious Behavior (Past 3 Months) Self-injurious behavior without suicidal intent     Suicidal and Self-Injurious Behavior (Lifetime) Interrupted attempt;Aborted or Self-Interrupted attempt     Suicidal Ideation Check Most Severe in Past Month Suicidal thoughts with method (but without specific plan or intent to act)     Activating Events (Recent) Recent loss(es) or other significant negative event(s) (legal, financial, relationship, etc.) (Describe)     Treatment History Previous psychiatric diagnoses and treatments     Protective Factors (Recent) Fear of death or dying due to pain and suffering                 Allergies:  Review of Patient's Allergies indicates:   Bee venom               Anaphylaxis   Carbamazepine           Hives   Methylprednisolone      Dizziness, Drowsiness    Comment:Also believes syncope. Seems to tolerate             inhalational and epidural steroid.   Nutritional supplem*       Hydroxyzine             Nausea Only      DSM-V    Primary Diagnosis: Schizoaffective disorder, depressive type [facility])          ASSESSMENT:   Continue treatment plan as per treatment team

## 2021-12-05 ENCOUNTER — Inpatient Hospital Stay
Admission: AD | Admit: 2021-12-05 | Discharge: 2021-12-17 | DRG: 562 | Disposition: A | Discharge status: Home / Self Care | Payer: No Typology Code available for payment source | Attending: Student in an Organized Health Care Education/Training Program | Admitting: Student in an Organized Health Care Education/Training Program

## 2021-12-05 ENCOUNTER — Ambulatory Visit (HOSPITAL_BASED_OUTPATIENT_CLINIC_OR_DEPARTMENT_OTHER): Payer: No Typology Code available for payment source

## 2021-12-05 ENCOUNTER — Other Ambulatory Visit: Payer: Self-pay

## 2021-12-05 ENCOUNTER — Encounter (HOSPITAL_BASED_OUTPATIENT_CLINIC_OR_DEPARTMENT_OTHER): Payer: Self-pay

## 2021-12-05 DIAGNOSIS — M23262 Derangement of other lateral meniscus due to old tear or injury, left knee: Secondary | ICD-10-CM | POA: Diagnosis present

## 2021-12-05 DIAGNOSIS — S0990XA Unspecified injury of head, initial encounter: Secondary | ICD-10-CM

## 2021-12-05 DIAGNOSIS — J449 Chronic obstructive pulmonary disease, unspecified: Secondary | ICD-10-CM | POA: Diagnosis present

## 2021-12-05 DIAGNOSIS — E876 Hypokalemia: Secondary | ICD-10-CM | POA: Diagnosis present

## 2021-12-05 DIAGNOSIS — F419 Anxiety disorder, unspecified: Secondary | ICD-10-CM | POA: Diagnosis present

## 2021-12-05 DIAGNOSIS — E039 Hypothyroidism, unspecified: Secondary | ICD-10-CM | POA: Diagnosis present

## 2021-12-05 DIAGNOSIS — G40909 Epilepsy, unspecified, not intractable, without status epilepticus: Secondary | ICD-10-CM | POA: Diagnosis present

## 2021-12-05 DIAGNOSIS — K219 Gastro-esophageal reflux disease without esophagitis: Secondary | ICD-10-CM | POA: Diagnosis present

## 2021-12-05 DIAGNOSIS — W19XXXA Unspecified fall, initial encounter: Secondary | ICD-10-CM | POA: Diagnosis not present

## 2021-12-05 DIAGNOSIS — R0902 Hypoxemia: Secondary | ICD-10-CM | POA: Diagnosis present

## 2021-12-05 DIAGNOSIS — F1190 Opioid use, unspecified, uncomplicated: Secondary | ICD-10-CM | POA: Diagnosis present

## 2021-12-05 DIAGNOSIS — F1490 Cocaine use, unspecified, uncomplicated: Secondary | ICD-10-CM | POA: Diagnosis present

## 2021-12-05 DIAGNOSIS — M23232 Derangement of other medial meniscus due to old tear or injury, left knee: Secondary | ICD-10-CM | POA: Diagnosis present

## 2021-12-05 DIAGNOSIS — G894 Chronic pain syndrome: Secondary | ICD-10-CM | POA: Diagnosis present

## 2021-12-05 DIAGNOSIS — F1910 Other psychoactive substance abuse, uncomplicated: Secondary | ICD-10-CM

## 2021-12-05 DIAGNOSIS — M25562 Pain in left knee: Secondary | ICD-10-CM | POA: Diagnosis not present

## 2021-12-05 DIAGNOSIS — Z720 Tobacco use: Secondary | ICD-10-CM

## 2021-12-05 DIAGNOSIS — Z23 Encounter for immunization: Secondary | ICD-10-CM

## 2021-12-05 DIAGNOSIS — R45851 Suicidal ideations: Secondary | ICD-10-CM | POA: Diagnosis present

## 2021-12-05 DIAGNOSIS — Z91030 Bee allergy status: Secondary | ICD-10-CM

## 2021-12-05 DIAGNOSIS — M48061 Spinal stenosis, lumbar 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 claudication: Secondary | ICD-10-CM | POA: Diagnosis present

## 2021-12-05 DIAGNOSIS — E785 Hyperlipidemia, unspecified: Secondary | ICD-10-CM | POA: Diagnosis present

## 2021-12-05 DIAGNOSIS — G4733 Obstructive sleep apnea (adult) (pediatric): Secondary | ICD-10-CM | POA: Diagnosis present

## 2021-12-05 DIAGNOSIS — F4310 Post-traumatic stress disorder, unspecified: Secondary | ICD-10-CM | POA: Diagnosis present

## 2021-12-05 DIAGNOSIS — Z9151 Personal history of suicidal behavior: Secondary | ICD-10-CM

## 2021-12-05 DIAGNOSIS — F251 Schizoaffective disorder, depressive type: Secondary | ICD-10-CM

## 2021-12-05 DIAGNOSIS — M25569 Pain in unspecified knee: Principal | ICD-10-CM | POA: Diagnosis present

## 2021-12-05 DIAGNOSIS — Z6841 Body Mass Index (BMI) 40.0 and over, adult: Secondary | ICD-10-CM

## 2021-12-05 DIAGNOSIS — E781 Pure hyperglyceridemia: Secondary | ICD-10-CM | POA: Diagnosis present

## 2021-12-05 DIAGNOSIS — S838X2A Sprain of other specified parts of left knee, initial encounter: Principal | ICD-10-CM | POA: Diagnosis present

## 2021-12-05 DIAGNOSIS — M79606 Pain in leg, unspecified: Secondary | ICD-10-CM

## 2021-12-05 DIAGNOSIS — F250 Schizoaffective disorder, bipolar type: Secondary | ICD-10-CM | POA: Diagnosis present

## 2021-12-05 DIAGNOSIS — I480 Paroxysmal atrial fibrillation: Secondary | ICD-10-CM | POA: Diagnosis present

## 2021-12-05 DIAGNOSIS — I5033 Acute on chronic diastolic (congestive) heart failure: Secondary | ICD-10-CM | POA: Diagnosis present

## 2021-12-05 DIAGNOSIS — F4381 Prolonged grief disorder: Secondary | ICD-10-CM

## 2021-12-05 DIAGNOSIS — F4320 Adjustment disorder, unspecified: Secondary | ICD-10-CM | POA: Diagnosis present

## 2021-12-05 DIAGNOSIS — J45909 Unspecified asthma, uncomplicated: Secondary | ICD-10-CM

## 2021-12-05 LAB — CBC WITH PLATELET
ABSOLUTE NRBC COUNT: 0 10*3/uL (ref 0.0–0.0)
HEMATOCRIT: 46.9 % — ABNORMAL HIGH (ref 34.1–44.9)
HEMOGLOBIN: 14.7 g/dL (ref 11.2–15.7)
MEAN CORP HGB CONC: 31.3 g/dL (ref 31.0–37.0)
MEAN CORPUSCULAR HGB: 30.9 pg (ref 26.0–34.0)
MEAN CORPUSCULAR VOL: 98.7 fl (ref 80.0–100.0)
MEAN PLATELET VOLUME: 11.2 fL (ref 8.7–12.5)
NRBC %: 0 % (ref 0.0–0.0)
PLATELET COUNT: 268 10*3/uL (ref 150–400)
RBC DISTRIBUTION WIDTH STD DEV: 52.3 fL — ABNORMAL HIGH (ref 35.1–46.3)
RED BLOOD CELL COUNT: 4.75 M/uL (ref 3.90–5.20)
WHITE BLOOD CELL COUNT: 8.8 10*3/uL (ref 4.0–11.0)

## 2021-12-05 LAB — BASIC METABOLIC PANEL
ANION GAP: 12 mmol/L (ref 10–22)
BUN (UREA NITROGEN): 15 mg/dL (ref 7–18)
CALCIUM: 9.4 mg/dL (ref 8.5–10.5)
CARBON DIOXIDE: 31 mmol/L (ref 21–32)
CHLORIDE: 99 mmol/L (ref 98–107)
CREATININE: 0.8 mg/dL (ref 0.4–1.2)
ESTIMATED GLOMERULAR FILT RATE: >60 mL/min (ref >60)
Glucose Random: 127 mg/dL (ref 74–160)
POTASSIUM: 3.6 mmol/L (ref 3.5–5.1)
SODIUM: 142 mmol/L (ref 136–145)

## 2021-12-05 MED ORDER — THIAMINE 100 MG PO TABS
100.0000 mg | ORAL_TABLET | Freq: Every day | ORAL | Status: DC
Start: 2021-12-06 — End: 2021-12-17
  Administered 2021-12-06 – 2021-12-17 (×12): 100 mg via ORAL
  Filled 2021-12-05 (×12): qty 1

## 2021-12-05 MED ORDER — DOCUSATE SODIUM 100 MG PO CAPS
100.0000 mg | ORAL_CAPSULE | Freq: Two times a day (BID) | ORAL | Status: DC
Start: 2021-12-06 — End: 2021-12-17
  Administered 2021-12-06 – 2021-12-17 (×23): 100 mg via ORAL
  Filled 2021-12-05 (×23): qty 1

## 2021-12-05 MED ORDER — SENNOSIDES 8.6 MG PO TABS
17.2000 mg | ORAL_TABLET | Freq: Every evening | ORAL | Status: DC
Start: 2021-12-05 — End: 2021-12-17
  Administered 2021-12-06 – 2021-12-16 (×11): 17.2 mg via ORAL
  Filled 2021-12-05 (×12): qty 2

## 2021-12-05 MED ORDER — NICOTINE 21 MG/24HR TD PT24
1.0000 | MEDICATED_PATCH | Freq: Every day | TRANSDERMAL | Status: DC
Start: 2021-12-06 — End: 2021-12-17
  Administered 2021-12-06 – 2021-12-17 (×12): 1 via TRANSDERMAL
  Filled 2021-12-05 (×12): qty 1

## 2021-12-05 MED ORDER — FERROUS GLUCONATE 324 (38 FE) MG PO TABS
324.0000 mg | ORAL_TABLET | ORAL | Status: DC
Start: 2021-12-06 — End: 2021-12-17
  Administered 2021-12-06 – 2021-12-17 (×6): 324 mg via ORAL
  Filled 2021-12-05 (×6): qty 1

## 2021-12-05 MED ORDER — ASPIRIN 81 MG PO CHEW
81.0000 mg | CHEWABLE_TABLET | Freq: Every day | ORAL | Status: DC
Start: 2021-12-06 — End: 2021-12-17
  Administered 2021-12-06 – 2021-12-17 (×11): 81 mg via ORAL
  Filled 2021-12-05 (×12): qty 1

## 2021-12-05 MED ORDER — BUPRENORPHINE HCL-NALOXONE HCL 8-2 MG SL SUBL
8.0000 mg | SUBLINGUAL_TABLET | Freq: Three times a day (TID) | SUBLINGUAL | Status: DC
Start: 2021-12-06 — End: 2021-12-17
  Administered 2021-12-06 – 2021-12-17 (×34): 1 via SUBLINGUAL
  Filled 2021-12-05 (×34): qty 1

## 2021-12-05 MED ORDER — ATORVASTATIN CALCIUM 80 MG PO TABS
80.0000 mg | ORAL_TABLET | Freq: Every evening | ORAL | Status: DC
Start: 2021-12-05 — End: 2021-12-17
  Administered 2021-12-06 – 2021-12-16 (×11): 80 mg via ORAL
  Filled 2021-12-05 (×12): qty 1

## 2021-12-05 MED ORDER — DICLOFENAC SODIUM 1 % EX GEL
4.0000 g | Freq: Four times a day (QID) | CUTANEOUS | Status: DC | PRN
Start: 2021-12-05 — End: 2021-12-08
  Administered 2021-12-06 – 2021-12-08 (×6): 4 g via TOPICAL
  Filled 2021-12-05: qty 50

## 2021-12-05 MED ORDER — OXCARBAZEPINE 150 MG PO TABS
150.0000 mg | ORAL_TABLET | Freq: Two times a day (BID) | ORAL | Status: DC
Start: 2021-12-06 — End: 2021-12-17
  Administered 2021-12-06 – 2021-12-17 (×23): 150 mg via ORAL
  Filled 2021-12-05 (×23): qty 1

## 2021-12-05 MED ORDER — POTASSIUM CHLORIDE CRYS ER 20 MEQ PO TBCR
20.0000 meq | EXTENDED_RELEASE_TABLET | Freq: Two times a day (BID) | ORAL | Status: DC
Start: 2021-12-06 — End: 2021-12-06
  Administered 2021-12-06: 20 meq via ORAL
  Filled 2021-12-05: qty 1

## 2021-12-05 MED ORDER — METHOCARBAMOL 500 MG PO TABS
750.0000 mg | ORAL_TABLET | Freq: Three times a day (TID) | ORAL | Status: DC
Start: 2021-12-06 — End: 2021-12-17
  Administered 2021-12-06 – 2021-12-17 (×36): 750 mg via ORAL
  Filled 2021-12-05 (×36): qty 2

## 2021-12-05 MED ORDER — ALUMINUM-MAGNESIUM-SIMETHICONE 200-200-20 MG/5ML PO SUSP
30.0000 mL | ORAL | Status: DC | PRN
Start: 2021-12-05 — End: 2021-12-17

## 2021-12-05 MED ORDER — TORSEMIDE 20 MG PO TABS
80.0000 mg | ORAL_TABLET | Freq: Two times a day (BID) | ORAL | Status: DC
Start: 2021-12-06 — End: 2021-12-07
  Administered 2021-12-06 – 2021-12-07 (×3): 80 mg via ORAL
  Filled 2021-12-05 (×3): qty 1

## 2021-12-05 MED ORDER — MELATONIN 3 MG PO TABS
3.0000 mg | ORAL_TABLET | Freq: Every evening | ORAL | Status: DC
Start: 2021-12-05 — End: 2021-12-17
  Administered 2021-12-06 – 2021-12-16 (×11): 3 mg via ORAL
  Filled 2021-12-05 (×12): qty 1

## 2021-12-05 MED ORDER — FLUOXETINE HCL 20 MG PO CAPS
20.0000 mg | ORAL_CAPSULE | Freq: Every day | ORAL | Status: DC
Start: 2021-12-06 — End: 2021-12-07
  Administered 2021-12-06 – 2021-12-07 (×2): 20 mg via ORAL
  Filled 2021-12-05 (×2): qty 1

## 2021-12-05 MED ORDER — NAPROXEN 250 MG PO TABS
250.0000 mg | ORAL_TABLET | Freq: Two times a day (BID) | ORAL | Status: DC | PRN
Start: 2021-12-05 — End: 2021-12-06
  Administered 2021-12-06: 250 mg via ORAL
  Filled 2021-12-05: qty 1

## 2021-12-05 MED ORDER — MAGNESIUM HYDROXIDE 400 MG/5ML PO SUSP
30.0000 mL | Freq: Every day | ORAL | Status: DC | PRN
Start: 2021-12-05 — End: 2021-12-17

## 2021-12-05 MED ORDER — NYSTATIN 100000 UNIT/GM EX POWD
Freq: Two times a day (BID) | CUTANEOUS | Status: DC | PRN
Start: 2021-12-05 — End: 2021-12-17
  Filled 2021-12-05 (×2): qty 30

## 2021-12-05 MED ORDER — PANTOPRAZOLE SODIUM 40 MG PO TBEC
40.0000 mg | DELAYED_RELEASE_TABLET | Freq: Every day | ORAL | Status: DC
Start: 2021-12-06 — End: 2021-12-17
  Administered 2021-12-06 – 2021-12-17 (×12): 40 mg via ORAL
  Filled 2021-12-05 (×12): qty 1

## 2021-12-05 MED ORDER — GABAPENTIN 300 MG PO CAPS
600.0000 mg | ORAL_CAPSULE | Freq: Three times a day (TID) | ORAL | Status: DC
Start: 2021-12-06 — End: 2021-12-17
  Administered 2021-12-06 – 2021-12-17 (×34): 600 mg via ORAL
  Filled 2021-12-05 (×34): qty 2

## 2021-12-05 MED ORDER — BUDESONIDE-FORMOTEROL FUMARATE 160-4.5 MCG/ACT IN AERO
2.0000 | INHALATION_SPRAY | Freq: Two times a day (BID) | RESPIRATORY_TRACT | Status: DC
Start: 2021-12-06 — End: 2021-12-17
  Administered 2021-12-06 – 2021-12-17 (×22): 2 via RESPIRATORY_TRACT
  Filled 2021-12-05: qty 6

## 2021-12-05 MED ORDER — ALBUTEROL SULFATE HFA 108 (90 BASE) MCG/ACT IN AERS
2.0000 | INHALATION_SPRAY | RESPIRATORY_TRACT | Status: DC | PRN
Start: 2021-12-05 — End: 2021-12-17
  Administered 2021-12-11 – 2021-12-16 (×4): 2 via RESPIRATORY_TRACT
  Filled 2021-12-05: qty 8

## 2021-12-05 MED ORDER — LOXAPINE SUCCINATE 25 MG PO CAPS
25.0000 mg | ORAL_CAPSULE | Freq: Two times a day (BID) | ORAL | Status: DC
Start: 2021-12-06 — End: 2021-12-11
  Administered 2021-12-06 – 2021-12-11 (×11): 25 mg via ORAL
  Filled 2021-12-05 (×11): qty 1

## 2021-12-05 MED ORDER — FLUTICASONE PROPIONATE 50 MCG/ACT NA SUSP
1.0000 | Freq: Two times a day (BID) | NASAL | Status: DC
Start: 2021-12-06 — End: 2021-12-17
  Administered 2021-12-06 – 2021-12-17 (×21): 1 via NASAL
  Filled 2021-12-05 (×2): qty 16

## 2021-12-05 MED ORDER — LEVETIRACETAM 500 MG PO TABS
750.0000 mg | ORAL_TABLET | Freq: Two times a day (BID) | ORAL | Status: DC
Start: 2021-12-06 — End: 2021-12-17
  Administered 2021-12-06 – 2021-12-17 (×23): 750 mg via ORAL
  Filled 2021-12-05 (×23): qty 1

## 2021-12-05 MED ORDER — LEVOTHYROXINE SODIUM 25 MCG PO TABS
25.0000 ug | ORAL_TABLET | Freq: Every morning | ORAL | Status: DC
Start: 2021-12-06 — End: 2021-12-17
  Administered 2021-12-06 – 2021-12-17 (×12): 25 ug via ORAL
  Filled 2021-12-05 (×12): qty 1

## 2021-12-05 MED ORDER — PRAZOSIN HCL 1 MG PO CAPS
2.0000 mg | ORAL_CAPSULE | Freq: Every evening | ORAL | Status: DC
Start: 2021-12-05 — End: 2021-12-09
  Administered 2021-12-06 – 2021-12-08 (×3): 2 mg via ORAL
  Filled 2021-12-05 (×4): qty 2

## 2021-12-05 MED ORDER — EUCERIN EX CREA
TOPICAL_CREAM | Freq: Two times a day (BID) | CUTANEOUS | Status: DC | PRN
Start: 2021-12-05 — End: 2021-12-17
  Filled 2021-12-05: qty 113

## 2021-12-05 MED ORDER — NICOTINE POLACRILEX 2 MG MT GUM
2.0000 mg | CHEWING_GUM | OROMUCOSAL | Status: DC | PRN
Start: 2021-12-05 — End: 2021-12-17
  Administered 2021-12-06 – 2021-12-17 (×50): 2 mg via ORAL
  Filled 2021-12-05 (×51): qty 1

## 2021-12-05 MED ORDER — LORAZEPAM 2 MG PO TABS
2.0000 mg | ORAL_TABLET | ORAL | Status: DC | PRN
Start: 2021-12-05 — End: 2021-12-06

## 2021-12-05 MED ORDER — ACETAMINOPHEN 325 MG PO TABS
650.0000 mg | ORAL_TABLET | ORAL | Status: DC | PRN
Start: 2021-12-05 — End: 2021-12-06

## 2021-12-05 MED ORDER — HALOPERIDOL 5 MG PO TABS
5.0000 mg | ORAL_TABLET | ORAL | Status: DC | PRN
Start: 2021-12-05 — End: 2021-12-07

## 2021-12-05 MED ORDER — ACAMPROSATE CALCIUM 333 MG PO TBEC
666.0000 mg | DELAYED_RELEASE_TABLET | Freq: Three times a day (TID) | ORAL | Status: DC
Start: 2021-12-06 — End: 2021-12-17
  Administered 2021-12-06 – 2021-12-17 (×31): 666 mg via ORAL
  Filled 2021-12-05 (×32): qty 2

## 2021-12-05 NOTE — Clinical Note (Incomplete)
H&P NOTE   12/05/2021    PATIENT INFO: Cheryl Zimmerman, [age] year old female  DATE OF ADMISSION: 12/05/21    ROOM/BED LOCATION:  610/610-A    CHIEF COMPLAINT:   ***    HISTORY OF PRESENT ILLNESS:   51 y.o. F schizoaffective disorderinitially admitted to the inpatient Psychiatry unit for management of suicidal ideation but transferred to medicine s/p mechanical fall.    Interval hx:  3/10 - patient initially presented with SI but admitted to inpatient service due to inc SOB and O2 requirement but improved after IV diuresis.  3/16 - patient transferred to psychiatry unit [facility] for continue observation/monitoring after medically cleared due to continued SI   3/19 - patient had mechanical fall while using walker and reported hit head and R knee. XR L knee does not show fx or acute issue. CT head did not show acute issues     REVIEW OF SYSTEMS:  Negative except as noted in HPI.    PAST MEDICAL HISTORY:   Patient Active Problem List:     Chest pain     Psychiatric disorder     SOB (shortness of breath)     Hypoxia     Suicidal behavior with attempted self-injury ([name])     Suicidal ideations     Tobacco use     Systolic congestive heart failure ([name])     Seizure disorder ([name])     Substance dependence ([name])     Schizoaffective disorder, depressive type ([name])        PAST SURGICAL HISTORY:   No past surgical history on file.      SOCIAL HISTORY:  Lives with: ***  Occupation:  ***  Tobacco: ***   Alcohol: ***  Drugs: ***   Social History    Social History Narrative      Not on file      FAMILY HISTORY:  No relevant Fhx. ***    ALLERGIES:   Review of Patient's Allergies indicates:   Bee venom               Anaphylaxis   Carbamazepine           Hives   Methylprednisolone      Dizziness, Drowsiness    Comment:Also believes syncope. Seems to tolerate             inhalational and epidural steroid.   Nutritional supplem*       Hydroxyzine             Nausea Only    MEDICATIONS PRIOR TO ADMISSION:   ***Please populate  using the dot phrase "PTAMEDSLIST" AFTER you have confirmed that the list is accurate -- if you are unable to complete the med rec before signing your H&P, please write "Unable to verify at the time of writing this note"    VITALS:   12/05/21  2300   Weight: (!) 158 kg (348 lb 4.8 oz)   Height: 5\' 9"  (1.753 m)       Intake/Output last 24hours (7a-7a):   No intake/output data recorded.    PHYSICAL EXAM:  Gen: NAD.  HEENT: PERRLA, EOMI, MMM.  CV: RRR. Normal S1, S2. No m/r/g.  Lungs: Normal work of breathing. CTAB.  Back: No CVA tenderness.  Abd: NABS. Soft, NTND.  Ext: Warm. No edema.  Neuro: A&Ox4. CN 2-12 grossly intact. 5/5 UE and LE strength.  Skin: No rashes.  T/L/D:     RECENT LABS:  BMP:  Recent Labs     12/03/21  0735 12/05/21  1724   NA  --  142   K 4.4 3.6   CL  --  99   CO2  --  31   BUN  --  15   CREAT  --  0.8   GLUCOSER  --  127     Ca, Mg, Phos:   Recent Labs     12/05/21  1724   CA 9.4     CBC:   Recent Labs     12/05/21  1724   WBC 8.8   HGB 14.7   HCT 46.9*   PLTA 268   RBC 4.75     Coagulation Labs: No results for input(s): PT, INR in the last 72 hours.    Invalid input(s): PTT  LFTs: No results for input(s): AST, ALT, TBILI, DBILI, ALKPHOS in the last 72 hours.  Pancreatic Enzymes: No results for input(s): AMY, LIP in the last 72 hours.  Urinalysis:No results for input(s): UACOL, UACLA, UAGLU, UABIL, UAKET, SPEGRAVURINE, UAOCC, UAPH, UAPRO, UANIT, LEUKOCYTES in the last 72 hours.    Invalid input(s): UAOBU  Troponin: No results for input(s): TROPI in the last 72 hours.     Other important labs include ***    IMAGING:   XR CHEST 2 VIEWS    Result Date: 11/06/2021  EXAM DESCRIPTION:  XR CHEST 2 VW CLINICAL HISTORY:  Chest pain COMPARISON:  11/04/2021 TECHNIQUE:  PA and lateral views of the chest FINDINGS:  Lines: No lines or catheters are noted. The lungs are clear. The costophrenic angles are clear. No pleural effusions. No pneumothorax is identified. The heart size and mediastinal contours  are within normal limits. The bones and soft tissues appear normal for age. IMPRESSION:  Slightly limited study due to technique. No acute pathology identified.    XR Chest Portable    Result Date: 11/24/2021  TECHNIQUE: Portable chest, 2:52 PM and repeat at 2:54 PM INDICATION: Hypoxia COMPARISON: 05/31/2011 FINDINGS: Quality: Satisfactory.   Tubes/lines: None. Lungs: There is a linear density consistent with atelectasis in the left base. There is overlying breast tissue obscuring a portion of the right base. No definite infiltrates are seen. Pleura: There is no pleural effusion or pneumothorax. Heart: The cardiac silhouette is unremarkable. Mediastinum/hila: Unremarkable. Bones and Soft Tissues: There are degenerative changes and scoliosis of the thoracic spine.   IMPRESSION: Bibasilar atelectasis, otherwise no acute cardiopulmonary findings.   Reviewed and Electronically Signed by: Lavella LemonsGregory Harrington MD Signed Date/Time: 11-24-2021 15:11:59       XR Knee Left 3 views    Result Date: 12/05/2021  Exam: Left knee, 4 views Indication: knee pain, recent fall Comparison: None Findings: Bones and Joints: There is no fracture or suspicious bone lesion. There is no dislocation. The joint spaces are maintained. No joint effusion is seen. Soft Tissues: Unremarkable.  Impression: Normal left knee radiographs.   Reviewed and Electronically Signed by: Lennie HummerJane Auger MD Signed Date/Time: 12-05-2021 19:42:47         EKG: ***    MICROBIOLOGY: ***    ASSESSMENT AND PLAN:      #Mechanical fall  Patient sustained mechanical fall today while using walker. Head CT and R knee XR unremarkable. Neuro exam ***. No other injuries. Patient unable to use walker due to R knee pain and requires wheelchair. Wheelchair is unable to fit [facility] Admission to medicine for further monitoring  -Pain: tylenol 1g TID, lidocaine patch,  ice pack    #Suicidal ideation  Schizoaffective disorder, bipolar type  PTSD  Anxiety  Grief  reaction  History of suicide attempts:  -Current regimen: Fluoxetine, loxapine, gabapentin, prazosin, melatonin, and as needed lorazepam, haloperidol.  - Ongoing care by the psychiatry team ***    Polysubstance abuse  History of opioid dependence  History of alcohol withdrawal:Not scoring on CIWA.  - Acamprosate  - Buprenorphine-naloxone  - Sobriety support  [ ]  Plan ongoing pharmacologic treatment and behavior modification treatment connections at discharge    Acute on chronic diastoliccongestive heart failure  COPD/asthma  Obesity  Obstructive Sleep Apnea  Chronic dyspnea  Chronic hypoxia:On the medical service, the patient appeared to accumulate fluid while on a reduced dose of baseline diuretic. Enhanceddiuresis was provided and weight gain appeared to resolve. Overall intake and output was noted fairly even per the recordings.  The patient also reports carrying a diagnosis of sleep apnea but no history of CPAP use. Subjectively, she is at her baseline respiratory status. Recorded oxygen saturations on the psychiatry unit have been as low as the lower 80s, but trending higher 80s to lower 90s most recently. She does appear to tolerate this degree of hypoxia and is probably a reasonable target zone for her given the underlying COPD, chronic hypoxia, obesity, and risk of CO2 retention from overly generous supplemental oxygenation. The ligature and other risksassociated with the mechanics of supplemental oxygen would further support no such intervention at this time.  - Budesonide-formoterol  - Fluticasone nasal spray  - Review and consider the addition of a long-acting antimuscarinic agent for COPD maintenance regimen  - Torsemide80 mg twice per day  - Nicotine replacement therapy and smoking cessation support  - Rescue albuterol as needed  [ ]  Weight reduction strategies as able  [ ]  Further review of the sleep apnea diagnosis and management options. Likely will need a formal outpatient sleep  study  [ ]  Recommend outpatientPulmonology follow    Hypokalemia: Potassium3.2in the morning of 3/16. Magnesium was 2. This is in the context of high-dose diuretics.  - Continue twice daily supplementation  - Continue to monitor trend  [ ]  continue to monitor OP/psych unit    Hypertriglyceridemia: Triglycerides 290 today.  - Lipid restricted diet (I will modify current diet)  - Atorvastatin  - Low-dose aspirin    Seizure disorder:Continue levetiracetam, gabapentin, oxcarbazepine    Complex chronic pain syndrome  Lumbar spinal stenosis  Sciatica:  - Buprenorphine-naloxone  - Fluoxetine  - Gabapentin  - Methocarbamol  - Maintain activity as able  - Weight reduction strategies as able  - Psychiatric care    Tobacco use disorder:  - Transdermal and trans buccal nicotine as needed  [ ]  Consider ongoing pharmacologic treatment and connection with behavioral modification program    Hepatitis C:? .  - Review diagnostic and treatment history  - Hep C PCR pending    Hypothyroidism:History of levothyroxine use.TSH normal on 11/24/2021.  - Levothyroxine    Atrial fibrillation: ? If paroxysmal or transient. Regular rhythm by auscultation. Not on negative chronotropes.  - Low-dose aspirin  - Continue to monitor rhythm  - If tachycardia should develop, plan electrocardiogram    Gastroesophageal reflux disease:  - Pantoprazole  - Antireflux hygiene    Bee VenomAllergy: History of anaphylaxis  - At discharge, if not already possessed,the patient should be provided with a prescription for epinephrine pen and provided with teaching    Diet: cardiac diet    DVT Prophylaxis: ***  Medication Reconciliation: {Done/Not Done:10287:o}  Was medication list from nursing facility collected & reviewed? ***Yes/No/n/a  Changes from home medication regimen: as above.    Code Status: Full Code  Health care proxy: Data Unavailable  Data Unavailable    Dispo: ***    *** MD    Discussed with attending Dr. Marland Kitchen

## 2021-12-05 NOTE — Discharge Summary (Addendum)
Psychiatry Discharge Summary  (Transfer to medicine)    Date of Admission: 12/02/21  Date of Discharge: 12/05/21    Transition/Hand-Off Issues:   - Please continue psych meds  - Follow up on [facility] results   - Can be transferred to medicine without a sitter overnight.  - In the AM, please consult CL team for them to follow and make further recommendations as needed  - Section 21 signed  - Patient should return to psychiatry once she is able   - Pt will be placed near the RN station on the medical unit   - Note: Case discussed with senior on call Dr. Clemon Chambers, who is aware of the 1:1 issue and agrees that the benefits of transferring to medicine without a sitter outweigh the risks.    Summary of Reason for Admission:   Per admission note by Dr. Marice Potter:  Cheryl Zimmerman came to the hospital on 03/07 complaining of thoughts of suicide associated with grief for her son Cheryl Zimmerman, who died at 57 yo of GSW two years ago.  Recently D's been hallucinating son's voice and image; this was better with the help of haloperidol for a while, but now happens many times a day and includes son's urges for D to join him in death, prompting her to contemplate suicide.  Also, she's unhappy with the [facility] group home where she lives, because one of the workers there is mean to her and the other residents, and the other residents aren't good company because they seem to be slower mentally than D.  She feels unloved, which also prompts her to contemplate suicide.  In the ED on 03/08, she deliberately wrapped O2 tubing around her neck in full view of observer; D figures she did that because she needed love, but she can't say what she thought it would accomplish, and she wouldn't do it again.    Between ED presentation and now, she was admitted to the general medical floor for hypoxia, which improved with diuresis to its baseline 88-94% SaO2.  She was followed by psychiatric consultant then and now presents for a voluntary psychiatric admission.  She doesn't  think her drug problem is an active issue now.      Hospital Course:   Initially presented to the ED 11/23/21 for SI with a plan. Attempted to strangle herself in the ED on 11/24/21 while awaiting placement. Was undergoing diuresis treatment in the ED. Admitted to medicine from 3/10 - 3/16, where the CL service followed her. She was eventually admitted to the inpatient psych unit on 3/16. Patient was admitted on a Section 12, however signed in on a CV upon admission. Throughout hospitalization, patient was in good behavioral control and did not require any physical or chemical restraints.    Earlier this evening, patient suffered mechanical fall with L knee and head strike. Left knee X-rays are negative. CTH final read pending. Since that time, patient has been unable to ambulate using her walker. Unable to weight bear on her left leg at all. She has been using a plus-size wheelchair on the unit. Unfortunately, the plus-size wheelchair cannot fit through any of the doors on the unit and the patient cannot be transported into her own room or any of the hallway bathrooms. Decision was made to transfer to medicine while patient regains mobility.     Per [facility], there are no sitters available overnight should she be transferred to medicine. This Clinical research associate was paged to determine whether patient is appropriate to be  without a sitter on the medical unit. There was concern for safety considering that the patient had initially presented to the ED with SI with a plan (11/23/21) and even attempted to strangle herself with O2 wires on 11/24/21 while in the ED awaiting placement. She was admitted onto the medical unit on the CL service, where she stayed from 3/10 - 3/16. She had a 1:1 sitter her entire stay on medicine.     Last SI was over a week ago. Patient is currently adamantly denying SI and any urges to self harm. Patient is calm, organized, and cooperative on tonight's interview: "I swear on my son's life, I won't try anything on the  medical floor... I just want help with my knee pain, I'm not suicidal at all and I haven't been for a week". RN staff feel that she has been calm and cooperative and without acute safety concerns since she has been on the unit. Patient was able to contract for safety; states that she would let her nurses know immediately if she feels any urges to harm herself.     Given that the patient was able to contract for safety and demonstrated safe behaviors for the past week, she was able to be discharged to medicine without 1:1 sitter. Medical unit will place her room close to the nursing station to ensure safety.      Consults: internal medicine     Recent Labs:   Lab Results   Component Value Date    NA 142 12/05/2021    K 3.6 12/05/2021    CL 99 12/05/2021    BUN 15 12/05/2021    CREAT 0.8 12/05/2021    HGB 14.7 12/05/2021    HCT 46.9 (H) 12/05/2021    WBC 8.8 12/05/2021    AST 30 06/02/2011    ALT 26 06/02/2011    ALBUMIN 2.9 (L) 06/02/2011     ---  BAL On Admission:  ETHANOL (mg/dL)   Date Value   02/72/5366 < 10         UDS On Admission:  AMPHETAMINES URINE (ng/mL)   Date Value   11/24/2021 NEGATIVE     COCAINE METABOLITES URINE (ng/mL)   Date Value   11/24/2021 POSITIVE (A)     OPIATES URINE (ng/mL)   Date Value   11/24/2021 NEGATIVE     BENZODIAZEPINES URINE (ng/mL)   Date Value   11/24/2021 NEGATIVE     CANNABINOIDS URINE (ng/mL)   Date Value   11/24/2021 NEGATIVE     ETHANOL URINE (mg/dL)   Date Value   44/11/4740 NEGATIVE           Significant Diagnostic Studies:   CTH pending  XR L knee wnl    Discharge Mental Status:   Mental Status Exam   General Appearance: Clean;Dressed in hospital gown/johnny  Behavior: Cooperative  Level of Consciousness: Alert  Orientation Level: Grossly Intact  Attention/Concentration: WNL  Mannerisms/Movements: No abnormal mannerisms/movements  Speech Quality and Rate: WNL  Speech Clarity: Clear  Speech Tone: Normal vocal inflection  Vocabulary/Fund of Knowledge: WNL  Memory:  Intact  Thought Process & Associations: Organized  Dissociative Symptoms: None  Thought Content: No abnormalities reported or observed  Delusions: None  Hallucinations: None  Suicidal Thoughts: None  Homicidal Thoughts: None  Mood: Euthymic  Affect: Congruent w/mood  Judgment: Fair  Insight: Fair    C-SSRS:  C-SSRS Risk Assessment  Suicidal and Self-Injurious Behavior (Past 3 Months): Self-injurious behavior without suicidal intent (12/03/21 1454)  Suicidal and Self-Injurious Behavior (Lifetime): Interrupted attempt;Aborted or Self-Interrupted attempt (12/03/21 1454)  Suicidal Ideation Check Most Severe in Past Month: Suicidal thoughts with method (but without specific plan or intent to act) (12/03/21 1454)  Activating Events (Recent): Recent loss(es) or other significant negative event(s) (legal, financial, relationship, etc.) (Describe) (12/03/21 1454)  Treatment History: Previous psychiatric diagnoses and treatments (12/03/21 1454)  Protective Factors (Recent): Fear of death or dying due to pain and suffering (12/03/21 1454)    DSM-V Diagnosis:  DSM-V    Primary Diagnosis: Schizoaffective disorder, depressive type [facility]([name])         Discharge Case Formulation:    [name] with PPH of Bipolar disorder, Schizoaffective disorder, polysubstance use PTSD, multiple SA, in the ED attempted to strangle her self with EKG wires, (04/2021) PMH notable for chronic systolic congestive heart failure, seizure disorder, on Keppra,  pain syndrome, COPD, Bronchial asthma, morbid obesity, admitted with suicidal ideation with a plan to jump in front of the train. Now being transferred to medicine for mobility issues (wheelchair incompatible with psych unit).     Discharge Risk Assessment:   In terms of risk, the patient is at chronically elevated risk for suicide and/or inadvertent self-harm. The height of her risk seems to have been several weeks ago when she was hearing AH of her son and endorsing SI with a plan  to jump in front of train tracks. For over a week now, she has been much more organized and not endorsing any suicidal ideation at all. She has been adherent with medications on the unit and has been very calm and cooperative with staff here. She is able to contract for safety and knows to contact RN staff on the medicine unit if she is experiencing any emotional difficulties. Staff on medicine have agreed to mitigate risk by  keeping her room close to the RN station. Due to lack of passive or active SI, the patient's appropriate level of organization, and consistent behavioral control over the past week, she does not need a 1:1 sitter at this time.       Plan:  - Please continue psych meds  - Follow up on CTH results   - Can be transferred to medicine without a sitter overnight.  - In the AM, please consult CL team for them to follow and make further recommendations as needed  - Section 21 signed  - Patient should return to psychiatry once she is able   - Pt will be placed near the RN station on the medical unit   - Note: Case discussed with senior on call Dr. Clemon Chambersarl Fulwiler, who is aware of the 1:1 issue and agrees that the benefits of transferring to medicine without a sitter outweigh the risks.      Discharge Medications:   Current Discharge Medication List    CONTINUE these medications which have NOT CHANGED    aspirin 81 MG chewable tablet  Take 1 tablet by mouth in the morning.    budesonide-formoterol (SYMBICORT) 160-4.5 MCG/ACT inhaler  Inhale 2 puffs into the lungs in the morning and 2 puffs before bedtime.  Qty: 1 each Refills: 0    buprenorphine-naloxone (SUBOXONE) 8-2 MG sublingual tablet  Place 1 tablet under the tongue in the morning and 1 tablet at noon and 1 tablet before bedtime. Max Daily Amount: 3 tablets.    diclofenac (VOLTAREN) 1 % GEL Gel  Apply 4 g topically 4 (four) times daily as needed (knee  pain)  Qty: 100 g Refills: 0    DULoxetine 40 MG CPEP  Take 40 mg by mouth daily    haloperidol  (HALDOL) 5 MG tablet  Take 1 tablet by mouth in the morning and 1 tablet at noon and 1 tablet before bedtime.    QUEtiapine (SEROQUEL) 50 MG tablet  Take 1 tablet by mouth every 6 (six) hours as needed    ferrous sulfate 324 (65 Fe) MG TBEC EC tablet  Take 1 tablet by mouth every other day    LORazepam (ATIVAN) 1 MG tablet  Take 1 tablet by mouth 2 (two) times daily as needed    melatonin 3 MG TABS tablet  Take 1 tablet by mouth nightly    naproxen (NAPROSYN) 250 MG tablet  Take 1 tablet by mouth 2 (two) times daily as needed for Pain    nicotine (NICODERM CQ) 21 MG/24HR  Place 1 patch onto the skin in the morning.    nicotine polacrilex (NICORETTE) 2 MG gum  Take 1 each by mouth every hour as needed for Craving (Nicotine)    prazosin (MINIPRESS) 2 MG capsule  Take 1 capsule by mouth nightly    sennosides (SENOKOT) 8.6 MG tablet  Take 2 tablets by mouth nightly as needed for Constipation (secondline)    torsemide (DEMADEX) 20 MG tablet  Take 3 tablets by mouth in the morning and 3 tablets before bedtime.    acamprosate (CAMPRAL) 333 MG tablet  Take 666 mg by mouth in the morning and 666 mg at noon and 666 mg before bedtime.    methocarbamol (ROBAXIN) 750 MG tablet  Take 750 mg by mouth in the morning and 750 mg at noon and 750 mg in the evening. Take before meals.    fluticasone (FLONASE) 50 MCG/ACT nasal spray  1 spray by Each Nostril route 2 (two) times daily    ferrous gluconate (FERGON) 324 MG tablet  Take 324 mg by mouth 3 (three) times a week Monday, Wednesday, Friday    atorvastatin (LIPITOR) 80 MG tablet  Take 80 mg by mouth nightly    gabapentin (NEURONTIN) 300 MG capsule  Take 600 mg by mouth in the morning and 600 mg at noon and 600 mg before bedtime.    docusate sodium (COLACE) 100 MG capsule  Take 100 mg by mouth daily as needed for Constipation    EPINEPHrine (EPIPEN JR 1:2000) 0.15 MG/0.15ML injection  Inject 0.15 mg into the muscle as needed    nystatin (MYCOSTATIN) cream  Apply topically 2 (two)  times daily as needed (Rash, redness)    nystatin (MYCOSTATIN) powder  Apply topically 2 (two) times daily as needed (Rash, redness)    albuterol HFA 108 (90 Base) MCG/ACT inhaler  Inhale 2 puffs into the lungs every 4 (four) hours as needed for Wheezing or Shortness of breath    ondansetron (ZOFRAN-ODT) 4 MG disintegrating tablet  Take 4 mg by mouth every 8 (eight) hours as needed for Nausea    levothyroxine (SYNTHROID) 25 MCG tablet  Take 25 mcg by mouth every morning before breakfast    loratadine (CLARITIN) 10 MG tablet  Take 10 mg by mouth in the morning.    omeprazole (PRILOSEC) 40 MG capsule  Take 40 mg by mouth in the morning.    OXcarbazepine (TRILEPTAL) 150 MG tablet  Take 150 mg by mouth in the morning and 150 mg before bedtime.    potassium chloride SA (K-DUR) 20 MEQ tablet  Take 20  mEq by mouth in the morning and 20 mEq before bedtime.    levETIRAcetam (KEPPRA) 750 MG tablet  Take 750 mg by mouth in the morning and 750 mg before bedtime.    thiamine (VITAMIN B-1) 100 MG tablet  Take 1 tablet by mouth daily.  Qty: 30 tablet Refills: 12      STOP taking these medications    doxepin (SINEQUAN) 10 MG/ML solution    potassium chloride (KLOR-CON) 20 MEQ packet    aspirin 81 MG EC tablet    ibuprofen (ADVIL,MOTRIN) 400 MG tablet                We discussed risks associated with antipsychotics, including extrapyramidal symptoms (EPS), weight gain, metabolic syndrome, cardiac problems, and tardive dyskinesia (TD).  We discussed the nature of EPS (stiffness, tremor, restlessness) and strategies for managing them, should they appear. Patient/Guardian understands that we will need to regularly monitor for weight gain and signs of metabolic disruption (increased serum cholesterol, glucose, and/or triglycerides) and take steps to prevent them because these effects can lead to diabetes and its associated health risks. Patient/Guardian understands black box warning.  Patient/Guardian also understands that we will need  to regularly monitor for evidence of TD, and that TD symptoms, once they appear, may be irreversible.    Discharge Activity: activity as tolerated    Discharge Diet: regular diet    "    Disposition/Living Situation:  Anticipated Discharge Plan  Expected Discharge Date: 12/10/21  Lives With: Roommate  Living Situation  Lives With: Roommate  Type of Residence: Group home    Follow-up Information    None         Esmeralda Links, MD  PGY3 Psychiatry  [phone]

## 2021-12-05 NOTE — Progress Notes (Signed)
PROGRESS NOTE    CC: Consulted requested for fall.    Subjective: She was in bathroom and was trying to get up and fall (mechanical fall). No LOC or any other sx.  She hit her head (frontal) and also left knee during the fall.    Allergies:   Review of Patient's Allergies indicates:   Bee venom               Anaphylaxis   Carbamazepine           Hives   Methylprednisolone      Dizziness, Drowsiness    Comment:Also believes syncope. Seems to tolerate             inhalational and epidural steroid.   Nutritional supplem*       Hydroxyzine             Nausea Only     Pre-Admission Medications:  aspirin 81 MG chewable tablet, Take 1 tablet by mouth in the morning., Disp: , Rfl:   budesonide-formoterol (SYMBICORT) 160-4.5 MCG/ACT inhaler, Inhale 2 puffs into the lungs in the morning and 2 puffs before bedtime., Disp: 1 each, Rfl: 0  buprenorphine-naloxone (SUBOXONE) 8-2 MG sublingual tablet, Place 1 tablet under the tongue in the morning and 1 tablet at noon and 1 tablet before bedtime. Max Daily Amount: 3 tablets., Disp: , Rfl:   diclofenac (VOLTAREN) 1 % GEL Gel, Apply 4 g topically 4 (four) times daily as needed (knee pain), Disp: 100 g, Rfl: 0  DULoxetine 40 MG CPEP, Take 40 mg by mouth daily, Disp: , Rfl:   haloperidol (HALDOL) 5 MG tablet, Take 1 tablet by mouth in the morning and 1 tablet at noon and 1 tablet before bedtime., Disp: , Rfl:   QUEtiapine (SEROQUEL) 50 MG tablet, Take 1 tablet by mouth every 6 (six) hours as needed, Disp: , Rfl:   ferrous sulfate 324 (65 Fe) MG TBEC EC tablet, Take 1 tablet by mouth every other day, Disp: , Rfl:   LORazepam (ATIVAN) 1 MG tablet, Take 1 tablet by mouth 2 (two) times daily as needed, Disp: , Rfl:   melatonin 3 MG TABS tablet, Take 1 tablet by mouth nightly, Disp: , Rfl:   naproxen (NAPROSYN) 250 MG tablet, Take 1 tablet by mouth 2 (two) times daily as needed for Pain, Disp: , Rfl:   nicotine (NICODERM CQ) 21 MG/24HR, Place 1 patch onto the skin in the morning., Disp:  , Rfl:   nicotine polacrilex (NICORETTE) 2 MG gum, Take 1 each by mouth every hour as needed for Craving (Nicotine), Disp: , Rfl:   prazosin (MINIPRESS) 2 MG capsule, Take 1 capsule by mouth nightly, Disp: , Rfl:   sennosides (SENOKOT) 8.6 MG tablet, Take 2 tablets by mouth nightly as needed for Constipation (secondline), Disp: , Rfl:   torsemide (DEMADEX) 20 MG tablet, Take 3 tablets by mouth in the morning and 3 tablets before bedtime., Disp: , Rfl:   [DISCONTINUED] doxepin (SINEQUAN) 10 MG capsule, Take 1 capsule by mouth nightly, Disp: , Rfl:   acamprosate (CAMPRAL) 333 MG tablet, Take 666 mg by mouth in the morning and 666 mg at noon and 666 mg before bedtime., Disp: , Rfl:   methocarbamol (ROBAXIN) 750 MG tablet, Take 750 mg by mouth in the morning and 750 mg at noon and 750 mg in the evening. Take before meals., Disp: , Rfl:   fluticasone (FLONASE) 50 MCG/ACT nasal spray, 1 spray by Each Nostril route 2 (two)  times daily, Disp: , Rfl:   ferrous gluconate (FERGON) 324 MG tablet, Take 324 mg by mouth 3 (three) times a week Monday, Wednesday, Friday, Disp: , Rfl:   atorvastatin (LIPITOR) 80 MG tablet, Take 80 mg by mouth nightly, Disp: , Rfl:   gabapentin (NEURONTIN) 300 MG capsule, Take 600 mg by mouth in the morning and 600 mg at noon and 600 mg before bedtime., Disp: , Rfl:   docusate sodium (COLACE) 100 MG capsule, Take 100 mg by mouth daily as needed for Constipation, Disp: , Rfl:   EPINEPHrine (EPIPEN JR 1:2000) 0.15 MG/0.15ML injection, Inject 0.15 mg into the muscle as needed, Disp: , Rfl:   nystatin (MYCOSTATIN) cream, Apply topically 2 (two) times daily as needed (Rash, redness), Disp: , Rfl:   nystatin (MYCOSTATIN) powder, Apply topically 2 (two) times daily as needed (Rash, redness), Disp: , Rfl:   albuterol HFA 108 (90 Base) MCG/ACT inhaler, Inhale 2 puffs into the lungs every 4 (four) hours as needed for Wheezing or Shortness of breath, Disp: , Rfl:   ondansetron (ZOFRAN-ODT) 4 MG disintegrating  tablet, Take 4 mg by mouth every 8 (eight) hours as needed for Nausea, Disp: , Rfl:   levothyroxine (SYNTHROID) 25 MCG tablet, Take 25 mcg by mouth every morning before breakfast, Disp: , Rfl:   loratadine (CLARITIN) 10 MG tablet, Take 10 mg by mouth in the morning., Disp: , Rfl:   omeprazole (PRILOSEC) 40 MG capsule, Take 40 mg by mouth in the morning., Disp: , Rfl:   OXcarbazepine (TRILEPTAL) 150 MG tablet, Take 150 mg by mouth in the morning and 150 mg before bedtime., Disp: , Rfl:   potassium chloride SA (K-DUR) 20 MEQ tablet, Take 20 mEq by mouth in the morning and 20 mEq before bedtime., Disp: , Rfl:   levETIRAcetam (KEPPRA) 750 MG tablet, Take 750 mg by mouth in the morning and 750 mg before bedtime., Disp: , Rfl:   thiamine (VITAMIN B-1) 100 MG tablet, Take 1 tablet by mouth daily., Disp: 30 tablet, Rfl: 12        Scheduled Medications:   docusate sodium  100 mg Oral BID    sennosides  17.2 mg Oral Nightly    torsemide  80 mg Oral BID    FLUoxetine  20 mg Oral Daily    loxapine  25 mg Oral BID    acamprosate  666 mg Oral TID    aspirin  81 mg Oral Daily    atorvastatin  80 mg Oral Nightly    budesonide-formoterol  2 puff Inhalation BID    buprenorphine-naloxone  8 mg Sublingual TID    ferrous gluconate  324 mg Oral Once per day on Mon Wed Fri    fluticasone  1 spray Each Nostril BID    gabapentin  600 mg Oral TID    prazosin  2 mg Oral Nightly    potassium chloride SA  20 mEq Oral BID    OXcarbazepine  150 mg Oral BID    pantoprazole  40 mg Oral Daily    nicotine  1 patch Transdermal Daily    levETIRAcetam  750 mg Oral BID    levothyroxine  25 mcg Oral DAILY    melatonin  3 mg Oral Nightly    methocarbamol  750 mg Oral TID AC    thiamine  100 mg Oral Daily       PRN Medications:  Current Facility-Administered Medications   Medication Dose Route Frequency Last Admin  haloperidol  5 mg Oral Q4H PRN      LORazepam  2 mg Oral Q4H PRN 2 mg at 12/03/21 1340    acetaminophen  650 mg Oral  Q4H PRN 650 mg at 12/02/21 2237    magnesium hydroxide  30 mL Oral Daily PRN      aluminum-magnesium hydroxide-simethicone  30 mL Oral Q2H PRN      albuterol HFA  2 puff Inhalation Q4H PRN 2 puff at 12/04/21 0932    diclofenac  4 g Topical 4x Daily PRN      nicotine polacrilex  2 mg Oral Q1H PRN 2 mg at 12/05/21 1524    naproxen  250 mg Oral BID PRN 250 mg at 12/05/21 0404    eucerin   Topical Q12H PRN Given at 12/02/21 2019    nystatin   Topical BID PRN           Intake/Output last 24hours (7a-7a):   No intake/output data recorded.    Vital Signs - Last 24 Hours:   BP: (116-145)/(74-84)   Temp:  [97.5 F (36.4 C)]   Pulse:  [72-100]   Resp:  [18-20]   SpO2:  [89 %-94 %]     Vital Signs - Last 8 Hours:   BP: (125)/(84)   Temp: --  Pulse:  [89-100]   Resp:  [18]   SpO2:  [91 %-92 %]     Physical Exam:   BP 125/84    Pulse 89    Temp 97.5 F (36.4 C) (Oral)    Resp 18    Ht 5\' 9"  (1.753 m)    Wt (!) 145.2 kg (320 lb)    SpO2 92%    BMI 47.26 kg/m     General Appearance:  Awake, cooperative.   Head:  Normocephalic, without obvious abnormality, mild tenderness above left eyebrow. No swelling or abrasion.   Eyes:  PERRL, conjunctiva/corneas clear, EOM's intact.   Ears:  Normal TM's and external ear canals, both ears   Nose: Nares normal, septum midline,mucosa normal, no drainage or sinus tenderness   Throat: Lips, mucosa, and tongue normal; teeth and gums normal   Neck: Supple, symmetrical, trachea midline, no adenopathy;  thyroid: not enlarged, symmetric, no tenderness/mass/nodules; no carotid bruit or JVD   Back:   Symmetric, no curvature, ROM normal, no CVA tenderness   Lungs:   Clear to auscultation bilaterally, respirations unlabored       Heart:  Regular rate and rhythm, S1 and S2 normal, no murmur, rub, or gallop   Abdomen:   Soft, non-tender, bowel sounds active all four quadrants,  no masses, no organomegaly       Extremities: Decreased ROM left knee because of pain, otherwise rest of the ext are  normal.   Pulses: 2+ and symmetric   Skin: No new rashes or lesions       Neurologic: No focal deficits. CN 2-12 Intact.          DATA:     Recent labs:  Lab Results   Component Value Date    NA 142 12/05/2021    K 3.6 12/05/2021    CL 99 12/05/2021    CO2 31 12/05/2021    BUN 15 12/05/2021    CREAT 0.8 12/05/2021    GLUCOSER 127 12/05/2021     Lab Results   Component Value Date    WBC 8.8 12/05/2021    HGB 14.7 12/05/2021    HCT 46.9 (H) 12/05/2021  PLTA 268 12/05/2021       Assessment/Plan:  50 year WF who was in psych unit and had a mechanical fall.    --Fall  ##Left knee pain  Non-focal on exam and no dizziness, nausea/vomiting or any other sx.  Check CT head, xray left knee (3 views).  Check CBC, BMP.  If above imaging and labs are normal then supportive care. Pain meds as needed.    Please let us know if any questions or above tests are abnormal.        Angelena FormHammad S Elchonon Maxson, MD, 12/05/2021   Pager [phone]

## 2021-12-05 NOTE — Progress Notes (Signed)
Identifying Data:  [name] that was admitted on 12/02/21    with see below    Legal Status: Legal Status - Section 12    INTERVAL HISTORY:   Patient Active Problem List:     Chest pain     Psychiatric disorder     SOB (shortness of breath)     Hypoxia     Suicidal behavior with attempted self-injury ([place])     Suicidal ideations     Tobacco use     Systolic congestive heart failure ([name])     Seizure disorder ([name])     Substance dependence ([place])     Schizoaffective disorder, depressive type ([place])    Notes poor sleep related to pain overnight.  Reports improved breathing.  Declines offers to reduce any potentially sedating medications.  Denies feeling overmedicated or sedated.  No overnight events, Medication compliance Yes, Sleep is fair, Appetite is good    VITALS:  BP 116/74    Pulse 77    Temp 97.5 F (36.4 C) (Oral)    Resp 18    Ht 5\' 9"  (1.753 m)    Wt (!) 145.2 kg (320 lb)    SpO2 92%    BMI 47.26 kg/m    Pain Score: 0 (0/10)    Scheduled Meds:   docusate sodium  100 mg Oral BID    sennosides  17.2 mg Oral Nightly    torsemide  80 mg Oral BID    FLUoxetine  20 mg Oral Daily    loxapine  25 mg Oral BID    acamprosate  666 mg Oral TID    aspirin  81 mg Oral Daily    atorvastatin  80 mg Oral Nightly    budesonide-formoterol  2 puff Inhalation BID    buprenorphine-naloxone  8 mg Sublingual TID    ferrous gluconate  324 mg Oral Once per day on Mon Wed Fri    fluticasone  1 spray Each Nostril BID    gabapentin  600 mg Oral TID    prazosin  2 mg Oral Nightly    potassium chloride SA  20 mEq Oral BID    OXcarbazepine  150 mg Oral BID    pantoprazole  40 mg Oral Daily    nicotine  1 patch Transdermal Daily    levETIRAcetam  750 mg Oral BID    levothyroxine  25 mcg Oral DAILY    melatonin  3 mg Oral Nightly    methocarbamol  750 mg Oral TID AC    thiamine  100 mg Oral Daily       PRN Meds:  Current Facility-Administered Medications   Medication Dose Route Frequency Last  Admin    haloperidol  5 mg Oral Q4H PRN      LORazepam  2 mg Oral Q4H PRN 2 mg at 12/03/21 1340    acetaminophen  650 mg Oral Q4H PRN 650 mg at 12/02/21 2237    magnesium hydroxide  30 mL Oral Daily PRN      aluminum-magnesium hydroxide-simethicone  30 mL Oral Q2H PRN      albuterol HFA  2 puff Inhalation Q4H PRN 2 puff at 12/04/21 0932    diclofenac  4 g Topical 4x Daily PRN      nicotine polacrilex  2 mg Oral Q1H PRN 2 mg at 12/04/21 1950    naproxen  250 mg Oral BID PRN 250 mg at 12/05/21 0404    eucerin  Topical Q12H PRN Given at 12/02/21 2019    nystatin   Topical BID PRN         Mental Status Examination Notable Findings:  Mental Status Exam   General Appearance: Dressed appropriately;Dressed in hospital gown/johnny  Behavior: Cooperative, pleasant upon approach  Level of Consciousness: Alert  Orientation Level: Grossly Intact  Attention/Concentration: WNL  Mannerisms/Movements: No abnormal mannerisms/movements  Speech Quality and Rate: WNL  Speech Clarity: Clear  Speech Tone: Normal vocal inflection  Vocabulary/Fund of Knowledge: WNL  Memory: Intact  Thought Process & Associations: Linear  Dissociative Symptoms: None  Thought Content: No abnormalities reported or observed  Delusions: None  Hallucinations: None  Suicidal Thoughts: Denies as  Homicidal Thoughts: None  Mood: Depressed, less  Affect: Constricted  Judgment: Fair  Insight: Fair      C-SSRS:  Default Flowsheet Data (most recent)     C-SSRS Frequent Screener - 12/04/21 0932        C-SSRS Frequent Screener    Since last contact, have you actually had thoughts about killing yourself ?  No     Have you been thinking about how you might do this?  No     Have you had these thoughts and had some intention of acting on them?  No     Have you started to work out or worked out the details of how to kill yourself? Do you intend to carry out this plan? No     Have you done anything, started to do anything, or prepared to do anything to end your  life?  No     C-SSRS Frequent Screener Risk Score No Risk                 Default Flowsheet Data (most recent)     C-SSRS Risk Assessment - 12/03/21 1454        C-SSRS Risk Assessment    Suicidal and Self-Injurious Behavior (Past 3 Months) Self-injurious behavior without suicidal intent     Suicidal and Self-Injurious Behavior (Lifetime) Interrupted attempt;Aborted or Self-Interrupted attempt     Suicidal Ideation Check Most Severe in Past Month Suicidal thoughts with method (but without specific plan or intent to act)     Activating Events (Recent) Recent loss(es) or other significant negative event(s) (legal, financial, relationship, etc.) (Describe)     Treatment History Previous psychiatric diagnoses and treatments     Protective Factors (Recent) Fear of death or dying due to pain and suffering                 Allergies:  Review of Patient's Allergies indicates:   Bee venom               Anaphylaxis   Carbamazepine           Hives   Methylprednisolone      Dizziness, Drowsiness    Comment:Also believes syncope. Seems to tolerate             inhalational and epidural steroid.   Nutritional supplem*       Hydroxyzine             Nausea Only      DSM-V    Primary Diagnosis: Schizoaffective disorder, depressive type ([name])         Latest Reference Range & Units 12/03/21 07:35   POTASSIUM 3.5 - 5.1 mmol/L 4.4     ASSESSMENT:   Continue treatment plan as per treatment team

## 2021-12-05 NOTE — H&P (Signed)
H&P NOTE   12/05/2021    PATIENT INFO: Cheryl Zimmerman, 12 51 year old female  DATE OF ADMISSION: 12/05/21    ROOM/BED LOCATION:  610/610-A    CHIEF COMPLAINT:   L knee pain    HISTORY OF PRESENT ILLNESS:   51 y.o. F schizoaffective disorderinitially admitted to the inpatient Psychiatry unit for management of suicidal ideation but transferred to medicine s/p mechanical fall.  She endorses pain in left knee and reports that she is unable to walk using walker.  She further reports decreased sensation on left lower extremity compared to right which is new since the fall.  Patient reports that she was walking using walker when left knee "gave out" causing her to fall downwards onto her left knee.  During this time the walker flew out from under her and she fell forward hitting her head.  She denies any LOC, lightheadedness, dizziness prior to or after the fall.  She denies new pain anywhere else.  She endorses chronic back pain but no worsening.  She denies urinary/bowel incontinence.  She denies SI, HI at this time.    Interval hx:  3/10 - patient initially presented with SI but admitted to inpatient service due to inc SOB and O2 requirement but improved after IV diuresis.  3/16 - patient transferred to psychiatry unit [facility] for continue observation/monitoring after medically cleared due to continued SI   3/19 - patient had mechanical fall while using walker and reported hit head and L knee. XR L knee does not show fx or acute issue. CT head did not show acute issues     REVIEW OF SYSTEMS:  Negative except as noted in HPI.    PAST MEDICAL HISTORY:   Patient Active Problem List:     Chest pain     Psychiatric disorder     SOB (shortness of breath)     Hypoxia     Suicidal behavior with attempted self-injury ([name])     Suicidal ideations     Tobacco use     Systolic congestive heart failure ([name])     Seizure disorder ([name])     Substance dependence ([name])     Schizoaffective disorder, depressive type  ([name])        PAST SURGICAL HISTORY:   No past surgical history on file.      SOCIAL HISTORY:  Lives with: Group home  Occupation: N/A  Tobacco: 3 packs/day   Alcohol: Few drinks every 2 weeks  Drugs: Polysubstance use, marijuana/cocaine/fentanyl, on Suboxone at home   Social History    Social History Narrative      Not on file      FAMILY HISTORY:  No relevant Fhx.     ALLERGIES:   Review of Patient's Allergies indicates:   Bee venom               Anaphylaxis   Carbamazepine           Hives   Methylprednisolone      Dizziness, Drowsiness    Comment:Also believes syncope. Seems to tolerate             inhalational and epidural steroid.   Nutritional supplem*       Hydroxyzine             Nausea Only    MEDICATIONS PRIOR TO ADMISSION:   Prior to Admission Medications   Prescriptions Last Dose Informant Patient Reported? Taking?   DULoxetine 40 MG CPEP   No Yes  Sig: Take 40 mg by mouth daily   EPINEPHrine (EPIPEN JR 1:2000) 0.15 MG/0.15ML injection   Yes Yes   Sig: Inject 0.15 mg into the muscle as needed   LORazepam (ATIVAN) 1 MG tablet   No Yes   Sig: Take 1 tablet by mouth 2 (two) times daily as needed   OXcarbazepine (TRILEPTAL) 150 MG tablet   Yes Yes   Sig: Take 150 mg by mouth in the morning and 150 mg before bedtime.   QUEtiapine (SEROQUEL) 50 MG tablet   No Yes   Sig: Take 1 tablet by mouth every 6 (six) hours as needed   acamprosate (CAMPRAL) 333 MG tablet   Yes Yes   Sig: Take 666 mg by mouth in the morning and 666 mg at noon and 666 mg before bedtime.   albuterol HFA 108 (90 Base) MCG/ACT inhaler   Yes Yes   Sig: Inhale 2 puffs into the lungs every 4 (four) hours as needed for Wheezing or Shortness of breath   aspirin 81 MG chewable tablet   No Yes   Sig: Take 1 tablet by mouth in the morning.   atorvastatin (LIPITOR) 80 MG tablet   Yes Yes   Sig: Take 80 mg by mouth nightly   budesonide-formoterol (SYMBICORT) 160-4.5 MCG/ACT inhaler   No Yes   Sig: Inhale 2 puffs into the lungs in the morning and 2 puffs  before bedtime.   buprenorphine-naloxone (SUBOXONE) 8-2 MG sublingual tablet   No Yes   Sig: Place 1 tablet under the tongue in the morning and 1 tablet at noon and 1 tablet before bedtime. Max Daily Amount: 3 tablets.   diclofenac (VOLTAREN) 1 % GEL Gel   No Yes   Sig: Apply 4 g topically 4 (four) times daily as needed (knee pain)   docusate sodium (COLACE) 100 MG capsule   Yes Yes   Sig: Take 100 mg by mouth daily as needed for Constipation   ferrous gluconate (FERGON) 324 MG tablet   Yes Yes   Sig: Take 324 mg by mouth 3 (three) times a week Monday, Wednesday, Friday   ferrous sulfate 324 (65 Fe) MG TBEC EC tablet   No Yes   Sig: Take 1 tablet by mouth every other day   fluticasone (FLONASE) 50 MCG/ACT nasal spray   Yes Yes   Sig: 1 spray by Each Nostril route 2 (two) times daily   gabapentin (NEURONTIN) 300 MG capsule   Yes Yes   Sig: Take 600 mg by mouth in the morning and 600 mg at noon and 600 mg before bedtime.   haloperidol (HALDOL) 5 MG tablet   No Yes   Sig: Take 1 tablet by mouth in the morning and 1 tablet at noon and 1 tablet before bedtime.   levETIRAcetam (KEPPRA) 750 MG tablet   Yes Yes   Sig: Take 750 mg by mouth in the morning and 750 mg before bedtime.   levothyroxine (SYNTHROID) 25 MCG tablet   Yes Yes   Sig: Take 25 mcg by mouth every morning before breakfast   loratadine (CLARITIN) 10 MG tablet   Yes Yes   Sig: Take 10 mg by mouth in the morning.   melatonin 3 MG TABS tablet   No Yes   Sig: Take 1 tablet by mouth nightly   methocarbamol (ROBAXIN) 750 MG tablet   Yes Yes   Sig: Take 750 mg by mouth in the morning and 750 mg at noon and 750  mg in the evening. Take before meals.   naproxen (NAPROSYN) 250 MG tablet   No Yes   Sig: Take 1 tablet by mouth 2 (two) times daily as needed for Pain   nicotine (NICODERM CQ) 21 MG/24HR   No Yes   Sig: Place 1 patch onto the skin in the morning.   nicotine polacrilex (NICORETTE) 2 MG gum   No Yes   Sig: Take 1 each by mouth every hour as needed for Craving  (Nicotine)   nystatin (MYCOSTATIN) cream   Yes Yes   Sig: Apply topically 2 (two) times daily as needed (Rash, redness)   nystatin (MYCOSTATIN) powder   Yes Yes   Sig: Apply topically 2 (two) times daily as needed (Rash, redness)   omeprazole (PRILOSEC) 40 MG capsule   Yes Yes   Sig: Take 40 mg by mouth in the morning.   ondansetron (ZOFRAN-ODT) 4 MG disintegrating tablet   Yes Yes   Sig: Take 4 mg by mouth every 8 (eight) hours as needed for Nausea   potassium chloride SA (K-DUR) 20 MEQ tablet   Yes Yes   Sig: Take 20 mEq by mouth in the morning and 20 mEq before bedtime.   prazosin (MINIPRESS) 2 MG capsule   No Yes   Sig: Take 1 capsule by mouth nightly   sennosides (SENOKOT) 8.6 MG tablet   No Yes   Sig: Take 2 tablets by mouth nightly as needed for Constipation (secondline)   thiamine (VITAMIN B-1) 100 MG tablet   No Yes   Sig: Take 1 tablet by mouth daily.   torsemide (DEMADEX) 20 MG tablet   No Yes   Sig: Take 3 tablets by mouth in the morning and 3 tablets before bedtime.      Facility-Administered Medications: None       VITALS:   12/05/21  2300   Weight: (!) 158 kg (348 lb 4.8 oz)   Height:  (1.753 m)       Intake/Output last 24hours (7a-7a):   No intake/output data recorded.    PHYSICAL EXAM:  Gen: NAD.  HEENT: PERRLA, EOMI, MMM.  CV: RRR. Normal S1, S2. No m/r/g.  Lungs: Normal work of breathing. CTAB.  Back: No CVA tenderness.  Abd: NABS. Soft, NTND.  Ext: Warm. No edema.  Neuro: A&Ox4. CN 2-12 grossly intact. 5/5 UE and LE strength.  Skin: No rashes.  T/L/D:     RECENT LABS:  BMP:   Recent Labs     12/03/21  0735 12/05/21  1724   NA  --  142   K 4.4 3.6   CL  --  99   CO2  --  31   BUN  --  15   CREAT  --  0.8   GLUCOSER  --  127     Ca, Mg, Phos:   Recent Labs     12/05/21  1724   CA 9.4     CBC:   Recent Labs     12/05/21  1724   WBC 8.8   HGB 14.7   HCT 46.9*   PLTA 268   RBC 4.75       IMAGING:   XR CHEST 2 VIEWS    Result Date: 11/06/2021  EXAM DESCRIPTION:  XR CHEST 2 VW CLINICAL HISTORY:   Chest pain COMPARISON:  11/04/2021 TECHNIQUE:  PA and lateral views of the chest FINDINGS:  Lines: No lines or catheters are noted. The lungs are clear.  The costophrenic angles are clear. No pleural effusions. No pneumothorax is identified. The heart size and mediastinal contours are within normal limits. The bones and soft tissues appear normal for age. IMPRESSION:  Slightly limited study due to technique. No acute pathology identified.    XR Chest Portable    Result Date: 11/24/2021  TECHNIQUE: Portable chest, 2:52 PM and repeat at 2:54 PM INDICATION: Hypoxia COMPARISON: 05/31/2011 FINDINGS: Quality: Satisfactory.   Tubes/lines: None. Lungs: There is a linear density consistent with atelectasis in the left base. There is overlying breast tissue obscuring a portion of the right base. No definite infiltrates are seen. Pleura: There is no pleural effusion or pneumothorax. Heart: The cardiac silhouette is unremarkable. Mediastinum/hila: Unremarkable. Bones and Soft Tissues: There are degenerative changes and scoliosis of the thoracic spine.   IMPRESSION: Bibasilar atelectasis, otherwise no acute cardiopulmonary findings.   Reviewed and Electronically Signed by: Lavella LemonsGregory Harrington MD Signed Date/Time: 11-24-2021 15:11:59       XR Knee Left 3 views    Result Date: 12/05/2021  Exam: Left knee, 4 views Indication: knee pain, recent fall Comparison: None Findings: Bones and Joints: There is no fracture or suspicious bone lesion. There is no dislocation. The joint spaces are maintained. No joint effusion is seen. Soft Tissues: Unremarkable.  Impression: Normal left knee radiographs.   Reviewed and Electronically Signed by: Lennie HummerJane Auger MD Signed Date/Time: 12-05-2021 19:42:47         EKG: n/a    MICROBIOLOGY:n/a    ASSESSMENT AND PLAN:  51 year-old female with history of housing instability, bipolar disorder, schizoaffective disorder, polysubstance use, PTSD, multiple suicide attempts, history of systolic congestive heart  failure, seizure disorder on keppra, complex pain syndrome, COPD, bronchial asthma, and obesity, presenting with suicidal ideation admitted to the inpatient Psychiatry unit for management of suicidal ideation but transferred to medicine s/p mechanical fall.     #Mechanical fall  #LLE pain  Patient sustained mechanical fall today while using walker. Head CT did not show any acute intracranial pathology and L knee XR did not show any fractures. Neuro exam difficult to assess and limited 2/2 pain but patient endorses decreased sensation on LLE. No other injuries. Patient is unable to use walker due to L knee pain and requires wheelchair. Wheelchair is unable to fit [facility]. Admission to medicine for further monitoring and pain control.  -F/u XR L hip  -Pain: tylenol 1g TID, lidocaine patch, ice pack  -PT eval  -Continue to monitor pain/sensation    Chronic issues (patient followed by medicine while on [name] unit):  #Suicidal ideation  #Schizoaffective disorder, bipolar type  #PTSD  #Anxiety  #Grief reaction  #History of suicide attempts  -Continue current regimen: Fluoxetine, loxapine, gabapentin, prazosin, melatonin, and as needed lorazepam, haloperidol.    #Polysubstance abuse  #History of opioid dependence  #History of alcohol withdrawal:Not scoring on CIWA.  - Acamprosate  - Buprenorphine-naloxone  [ ]  Plan ongoing pharmacologic treatment and behavior modification treatment connections at discharge    #Acute on chronic diastoliccongestive heart failure  #COPD/asthma  #Obesity  #Obstructive Sleep Apnea  #Chronic dyspnea  #Chronic hypoxic: Initially, patient was noted to be hypoxic during previous admission but this resolved with diuresis. She has been stable on current diuretic maintenance dose. Patient reports that her "O2 levels decreased at night" and reports that she has sleep apnea but no documented or reported history of CPAP use or sleep study. She denies issues with  breathing at this  time. subjectively, she is at her baseline respiratory status. Ligature and other risksassociated with the mechanics of O2 monitoring therefore we will proceed with spot checks.  - Budesonide-formoterol  - Fluticasone nasal spray  - Review and consider the addition of a long-acting antimuscarinic agent for COPD maintenance regimen  - Torsemide80 mg twice per day  - Nicotine replacement therapy and smoking cessation support  - Rescue albuterol as needed  [ ]  Weight reduction strategies as able  [ ]  Further review of the sleep apnea diagnosis and management options. Likely will need a formal outpatient sleep study  [ ]  Recommend outpatientPulmonology follow    #Hypokalemia: Potassium3.2in the morning of 3/16. Magnesium was 2. This is in the context of high-dose diuretics.  - Continue twice daily supplementation  - Continue to monitor trend  [ ]  continue to monitor OP/psych unit    #Hypertriglyceridemia: Triglycerides 290 today.  - Atorvastatin  - Low-dose aspirin    #Seizure disorder  - Continue levetiracetam, gabapentin, oxcarbazepine    #Complex chronic pain syndrome  #Lumbar spinal stenosis  #Sciatica:  - Buprenorphine-naloxone  - Fluoxetine  - Gabapentin  - Methocarbamol  - Maintain activity as able  [ ]  Weight reduction strategies as able    #Tobacco use disorder:  - Transdermal nicotine as needed  [ ]  Consider ongoing pharmacologic treatment and connection with behavioral modification program    #Hepatitis C:? .  - Hep C PCR pending    #Hypothyroidism:History of levothyroxine use.TSH normal on 11/24/2021.  - Levothyroxine    #Atrial fibrillation: ? If paroxysmal or transient. Regular rhythm by auscultation.   - Low-dose aspirin  - Continue to monitor rhythm  - If tachycardia should develop, plan electrocardiogram    #Gastroesophageal reflux disease:  - Pantoprazole  - Antireflux hygiene    #Bee VenomAllergy: History of anaphylaxis  [ ]   prescription for epinephrine pen and  teaching at d/c     Diet: cardiac diet    DVT Prophylaxis: SQ hep    Medication Reconciliation: Done - continued meds from [facility], followed by medicine  Was medication list from nursing facility collected & reviewed? n/a  Changes from home medication regimen: as above.    Code Status: Full Code  Health care proxy: Data Unavailable  Data Unavailable    Dispo: Admission for pain control and monitoring    , MD   12/06/2021  Internal Medicine PGY 1  Pager: [phone]    Discussed with attending Dr. .

## 2021-12-05 NOTE — Progress Notes (Signed)
0700-1500  Problem: Risk for Self Injury/Neglect  Goal: Treatment Goal: Remain safe during length of stay, learn and adopt new coping skills, and be free of self-injurious ideation, impulses and acts at the time of discharge  Outcome: Progressing     At the start of this shift pt was received in milieu. On initial approach pt is pleasant and cooperative. Continues to make multiple somatic complaints regarding pain but is easily redirectable. Compliant with scheduled meds including pulse monitoring q4 hours per order. WNL on all assessments. Continuing on suboxone, tolerating well. Appetite is intact pt frequently requesting snacks through out shift. Reported VH of seeing dead son, able to seek staff out if feeling unsafe. Ambulating around milieu with rolling walker, at times needs reminder as she is observed pacing without it. Denies SI/HI/AH/VH. No behavioral or safety concerns to report at this time.

## 2021-12-05 NOTE — Progress Notes (Signed)
1500-2300  Problem: Risk for Self Injury/Neglect  Goal: Verbalize thoughts and feelings associated with:  Description:     12/05/2021 2157 by Carmelia Bake, RN  Outcome: Progressing  12/05/2021 1144 by Carmelia Bake, RN  Outcome: Progressing       At the start of this shift pt was received in milieu socializing with select peers and staff. Affect appears bright, easily engages. Appetite is intact, requested information regarding diet options due to restrictions, eating snacks through out shift. Reported worsening pain in legs, offered available PRN of tylenol which pt declined, also offered nonpharmacologic interventions of ice which pt declined. Participiated in groups through out shift, appears to have enjoyed. Following art group pt utilized rolling walker to ambulate through hallway. Ambulated appropiately. Pt appeared to have went to bathroom and had an unwitnessed fall. Per staff on checks pt called out while in bathroom. On assessment pt is alert x oriented x3. Did not loose LOC. Reported getting up after using toilet and left leg "giving out" causing her to fall over. Pt reported hitting left leg and forehead. On assessment mild tenderness and brusing on forehead, no open areas observed.Vitals assessed and were stable. DOC notifed, see new orders for imaging and labs. OSM notified. Wheelchair obtained. PRN naproxen given, no relief reported. Also received scheduled HS meds. Pt reported severe pain following fall, unable to ambulate and requiring 2+ staff assist to pivot, unable to bare weight on left leg. Wheelchair unable to fit through majority of doors in unit and pt unable to ambulate to bed or bathroom due to pain. Remained in wheelchair for remainder of shift frequently complaining of pain. Continuing to monitor vitals per order. Pt adamantly denying SI/HI/AH/VH through out shift and at this time.     Report given to 6N staff at 10:20pm, will be transferred with RN.

## 2021-12-05 NOTE — RN Shift Note (Signed)
2300-0700    Cheryl Zimmerman came to the nurses station complaining of pain in her hands. Patients hands were slightly swollen, no pitting edema noted.  Pt was given a hot pack at 0205 to relieve pain with good effect. Pt later complained of pain in her legs and knees. Naproxen 250 mg was given at 0404 with good effect. 15 min's safety checks maintained.

## 2021-12-05 NOTE — H&P (Incomplete)
H&P NOTE   12/05/2021    PATIENT INFO: Cheryl Zimmerman, 1 51 year old female  DATE OF ADMISSION: 12/02/21    ROOM/BED LOCATION:  309/309-B    CHIEF COMPLAINT:   ***    HISTORY OF PRESENT ILLNESS:   51 y.o. F schizoaffective disorder initially admitted to the inpatient Psychiatry unit for management of suicidal ideation but transferred to medicine s/p mechanical fall.    Interval hx:  3/10 - patient initially presented with SI but admitted to inpatient service due to inc SOB and O2 requirement but improved after IV diuresis.  3/16 - patient transferred to psychiatry unit [place] for continue observation/monitoring after medically cleared due to continued SI   3/19 - patient had mechanical fall while using walker and reported hit head and R knee. XR L knee does not show fx or acute issue. CT head did not show acute issues      REVIEW OF SYSTEMS:  Negative except as noted in HPI.    PAST MEDICAL HISTORY:   Patient Active Problem List:     Chest pain     Psychiatric disorder     SOB (shortness of breath)     Hypoxia     Suicidal behavior with attempted self-injury ([place])     Suicidal ideations     Tobacco use     Systolic congestive heart failure ([name])     Seizure disorder ([name])     Substance dependence ([place])     Schizoaffective disorder, depressive type ([place])        PAST SURGICAL HISTORY:   No past surgical history on file.      SOCIAL HISTORY:  Lives with: ***  Occupation:  ***  Tobacco: ***   Alcohol: ***  Drugs: ***   Social History    Social History Narrative      Not on file      FAMILY HISTORY:  No relevant Fhx. ***    ALLERGIES:   Review of Patient's Allergies indicates:   Bee venom               Anaphylaxis   Carbamazepine           Hives   Methylprednisolone      Dizziness, Drowsiness    Comment:Also believes syncope. Seems to tolerate             inhalational and epidural steroid.   Nutritional supplem*       Hydroxyzine             Nausea Only    MEDICATIONS PRIOR TO ADMISSION:   ***Please populate  using the dot phrase "PTAMEDSLIST" AFTER you have confirmed that the list is accurate -- if you are unable to complete the med rec before signing your H&P, please write "Unable to verify at the time of writing this note"    VITALS:   12/05/21  1707 12/05/21  1723 12/05/21  2004 12/05/21  2005   BP: 125/84 125/84 127/86    Pulse: 89      Resp: 18   18   Temp:       TempSrc:       SpO2:       Weight:       Height:           Intake/Output last 24hours (7a-7a):   No intake/output data recorded.    PHYSICAL EXAM:  Gen: NAD.  HEENT: PERRLA, EOMI, MMM.  CV: RRR. Normal S1, S2. No m/r/g.  Lungs: Normal work of breathing. CTAB.  Back: No CVA tenderness.  Abd: NABS. Soft, NTND.  Ext: Warm. No edema.  Neuro: A&Ox4. CN 2-12 grossly intact. 5/5 UE and LE strength.  Skin: No rashes.  T/L/D:     RECENT LABS:  BMP:   Recent Labs     12/03/21  0735 12/05/21  1724   NA  --  142   K 4.4 3.6   CL  --  99   CO2  --  31   BUN  --  15   CREAT  --  0.8   GLUCOSER  --  127     Ca, Mg, Phos:   Recent Labs     12/05/21  1724   CA 9.4     CBC:   Recent Labs     12/05/21  1724   WBC 8.8   HGB 14.7   HCT 46.9*   PLTA 268   RBC 4.75     Coagulation Labs: No results for input(s): PT, INR in the last 72 hours.    Invalid input(s): PTT  LFTs: No results for input(s): AST, ALT, TBILI, DBILI, ALKPHOS in the last 72 hours.  Pancreatic Enzymes: No results for input(s): AMY, LIP in the last 72 hours.  Urinalysis:No results for input(s): UACOL, UACLA, UAGLU, UABIL, UAKET, SPEGRAVURINE, UAOCC, UAPH, UAPRO, UANIT, LEUKOCYTES in the last 72 hours.    Invalid input(s): UAOBU  Troponin: No results for input(s): TROPI in the last 72 hours.     Other important labs include ***    IMAGING:   XR CHEST 2 VIEWS    Result Date: 11/06/2021  EXAM DESCRIPTION:  XR CHEST 2 VW CLINICAL HISTORY:  Chest pain COMPARISON:  11/04/2021 TECHNIQUE:  PA and lateral views of the chest FINDINGS:  Lines: No lines or catheters are noted. The lungs are clear. The costophrenic  angles are clear. No pleural effusions. No pneumothorax is identified. The heart size and mediastinal contours are within normal limits. The bones and soft tissues appear normal for age. IMPRESSION:  Slightly limited study due to technique. No acute pathology identified.    XR Chest Portable    Result Date: 11/24/2021  TECHNIQUE: Portable chest, 2:52 PM and repeat at 2:54 PM INDICATION: Hypoxia COMPARISON: 05/31/2011 FINDINGS: Quality: Satisfactory.   Tubes/lines: None. Lungs: There is a linear density consistent with atelectasis in the left base. There is overlying breast tissue obscuring a portion of the right base. No definite infiltrates are seen. Pleura: There is no pleural effusion or pneumothorax. Heart: The cardiac silhouette is unremarkable. Mediastinum/hila: Unremarkable. Bones and Soft Tissues: There are degenerative changes and scoliosis of the thoracic spine.   IMPRESSION: Bibasilar atelectasis, otherwise no acute cardiopulmonary findings.   Reviewed and Electronically Signed by: Penni Homans MD Signed Date/Time: 11-24-2021 15:11:59       XR Knee Left 3 views    Result Date: 12/05/2021  Exam: Left knee, 4 views Indication: knee pain, recent fall Comparison: None Findings: Bones and Joints: There is no fracture or suspicious bone lesion. There is no dislocation. The joint spaces are maintained. No joint effusion is seen. Soft Tissues: Unremarkable.  Impression: Normal left knee radiographs.   Reviewed and Electronically Signed by: Eda Paschal MD Signed Date/Time: 12-05-2021 19:42:47         EKG: ***    MICROBIOLOGY: ***    ASSESSMENT AND PLAN:      #Mechanical fall  Patient sustained mechanical fall today while using walker. Head  CT and R knee XR unremarkable. Neuro exam ***. No other injuries. Patient unable to use walker due to R knee pain and requires wheelchair. Wheelchair is unable to fit throughout doors at [employer]. Admission to medicine for further monitoring  -Pain: tylenol 1g  TID    Suicidal ideation  Schizoaffective disorder, bipolar type  PTSD  Anxiety  Grief reaction  History of suicide attempts:  -Current regimen: Fluoxetine, loxapine, gabapentin, prazosin, melatonin, and as needed lorazepam, haloperidol.  - Ongoing care by the psychiatry team ***    Polysubstance abuse  History of opioid dependence  History of alcohol withdrawal: Not scoring on CIWA.  - Acamprosate  - Buprenorphine-naloxone  - Sobriety support  [ ]  Plan ongoing pharmacologic treatment and behavior modification treatment connections at discharge    Acute on chronic diastolic congestive heart failure  COPD/asthma  Obesity  Obstructive Sleep Apnea  Chronic dyspnea  Chronic hypoxia: On the medical service, the patient appeared to accumulate fluid while on a reduced dose of baseline diuretic.  Enhanced diuresis was provided and weight gain appeared to resolve.  Overall intake and output was noted fairly even per the recordings.  The patient also reports carrying a diagnosis of sleep apnea but no history of CPAP use.  Subjectively, she is at her baseline respiratory status.  Recorded oxygen saturations on the psychiatry unit have been as low as the lower 80s, but trending higher 80s to lower 90s most recently.  She does appear to tolerate this degree of hypoxia and is probably a reasonable target zone for her given the underlying COPD, chronic hypoxia, obesity, and risk of CO2 retention from overly generous supplemental oxygenation.  The ligature and other risks associated with the mechanics of supplemental oxygen would further support no such intervention at this time.  - Budesonide-formoterol  - Fluticasone nasal spray  - Review and consider the addition of a long-acting antimuscarinic agent for COPD maintenance regimen  - Torsemide 80 mg twice per day  - Nicotine replacement therapy and smoking cessation support  - Rescue albuterol as needed  [ ]  Weight reduction strategies as able  [ ]  Further review of the sleep  apnea diagnosis and management options.  Likely will need a formal outpatient sleep study  [ ]  Recommend outpatient Pulmonology follow    Hypokalemia: Potassium 3.2 in the morning of 3/16.  Magnesium was 2.  This is in the context of high-dose diuretics.  - Continue twice daily supplementation  - Continue to monitor trend  [ ]  continue to monitor OP/psych unit    Hypertriglyceridemia: Triglycerides 290 today.  - Lipid restricted diet (I will modify current diet)  - Atorvastatin  - Low-dose aspirin    Seizure disorder: Continue levetiracetam, gabapentin, oxcarbazepine    Complex chronic pain syndrome  Lumbar spinal stenosis  Sciatica:  - Buprenorphine-naloxone  - Fluoxetine  - Gabapentin  - Methocarbamol  - Maintain activity as able  - Weight reduction strategies as able  - Psychiatric care    Tobacco use disorder:  - Transdermal and trans buccal nicotine as needed  [ ]  Consider ongoing pharmacologic treatment and connection with behavioral modification program    Hepatitis C:? .  - Review diagnostic and treatment history  - Hep C PCR pending    Hypothyroidism: History of levothyroxine use.  TSH normal on 11/24/2021.  - Levothyroxine    Atrial fibrillation: ? If paroxysmal or transient.  Regular rhythm by auscultation.  Not on negative chronotropes.  - Low-dose aspirin  -  Continue to monitor rhythm  - If tachycardia should develop, plan electrocardiogram    Gastroesophageal reflux disease:  - Pantoprazole  - Antireflux hygiene    Bee Venom Allergy: History of anaphylaxis  - At discharge, if not already possessed, the patient should be provided with a prescription for epinephrine pen and provided with teaching    Diet: ***    DVT Prophylaxis: ***    Medication Reconciliation: {Done/Not Done:10287:o}  Was medication list from nursing facility collected & reviewed? ***Yes/No/n/a  Changes from home medication regimen: as above.    Code Status: Full Code  Health care proxy: Data Unavailable  Data  Unavailable    Dispo: ***    *** MD    Discussed with attending Dr. Marland Kitchen

## 2021-12-06 ENCOUNTER — Inpatient Hospital Stay (HOSPITAL_BASED_OUTPATIENT_CLINIC_OR_DEPARTMENT_OTHER): Payer: No Typology Code available for payment source

## 2021-12-06 ENCOUNTER — Encounter (HOSPITAL_BASED_OUTPATIENT_CLINIC_OR_DEPARTMENT_OTHER): Payer: Self-pay | Admitting: Internal Medicine

## 2021-12-06 DIAGNOSIS — F4310 Post-traumatic stress disorder, unspecified: Secondary | ICD-10-CM

## 2021-12-06 DIAGNOSIS — R200 Anesthesia of skin: Secondary | ICD-10-CM

## 2021-12-06 DIAGNOSIS — R600 Localized edema: Secondary | ICD-10-CM

## 2021-12-06 DIAGNOSIS — G894 Chronic pain syndrome: Secondary | ICD-10-CM

## 2021-12-06 DIAGNOSIS — M48061 Spinal stenosis, lumbar 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 claudication: Secondary | ICD-10-CM

## 2021-12-06 DIAGNOSIS — M5430 Sciatica, unspecified side: Secondary | ICD-10-CM

## 2021-12-06 DIAGNOSIS — R202 Paresthesia of skin: Secondary | ICD-10-CM | POA: Diagnosis not present

## 2021-12-06 DIAGNOSIS — R0602 Shortness of breath: Secondary | ICD-10-CM

## 2021-12-06 DIAGNOSIS — M25569 Pain in unspecified knee: Principal | ICD-10-CM | POA: Diagnosis present

## 2021-12-06 DIAGNOSIS — M25562 Pain in left knee: Secondary | ICD-10-CM

## 2021-12-06 DIAGNOSIS — I5040 Unspecified combined systolic (congestive) and diastolic (congestive) heart failure: Secondary | ICD-10-CM

## 2021-12-06 DIAGNOSIS — W19XXXA Unspecified fall, initial encounter: Secondary | ICD-10-CM

## 2021-12-06 DIAGNOSIS — F419 Anxiety disorder, unspecified: Secondary | ICD-10-CM

## 2021-12-06 DIAGNOSIS — R45851 Suicidal ideations: Secondary | ICD-10-CM

## 2021-12-06 DIAGNOSIS — F250 Schizoaffective disorder, bipolar type: Secondary | ICD-10-CM

## 2021-12-06 LAB — BASIC METABOLIC PANEL
ANION GAP: 12 mmol/L (ref 10–22)
BUN (UREA NITROGEN): 16 mg/dL (ref 7–18)
CALCIUM: 9.5 mg/dL (ref 8.5–10.5)
CARBON DIOXIDE: 29 mmol/L (ref 21–32)
CHLORIDE: 99 mmol/L (ref 98–107)
CREATININE: 0.8 mg/dL (ref 0.4–1.2)
ESTIMATED GLOMERULAR FILT RATE: >60 mL/min (ref >60)
Glucose Random: 106 mg/dL (ref 74–160)
POTASSIUM: 4.1 mmol/L (ref 3.5–5.1)
SODIUM: 140 mmol/L (ref 136–145)

## 2021-12-06 LAB — NT-PROBNP: NT-proBNP: 52 pg/mL (ref 0–125)

## 2021-12-06 MED ORDER — NAPROXEN 250 MG PO TABS
500.0000 mg | ORAL_TABLET | Freq: Two times a day (BID) | ORAL | Status: DC | PRN
Start: 2021-12-06 — End: 2021-12-12
  Administered 2021-12-06 – 2021-12-12 (×7): 500 mg via ORAL
  Filled 2021-12-06 (×7): qty 2

## 2021-12-06 MED ORDER — ACETAMINOPHEN 500 MG PO TABS
1000.0000 mg | ORAL_TABLET | Freq: Three times a day (TID) | ORAL | Status: DC
Start: 2021-12-06 — End: 2021-12-06
  Administered 2021-12-06: 1000 mg via ORAL
  Filled 2021-12-06: qty 2

## 2021-12-06 MED ORDER — QUETIAPINE FUMARATE 50 MG PO TABS
50.0000 mg | ORAL_TABLET | Freq: Four times a day (QID) | ORAL | Status: DC | PRN
Start: 2021-12-06 — End: 2021-12-06
  Administered 2021-12-06: 50 mg via ORAL
  Filled 2021-12-06: qty 1

## 2021-12-06 MED ORDER — LORAZEPAM 1 MG PO TABS
1.0000 mg | ORAL_TABLET | ORAL | Status: DC | PRN
Start: 2021-12-06 — End: 2021-12-17
  Administered 2021-12-08 – 2021-12-17 (×31): 1 mg via ORAL
  Filled 2021-12-06 (×32): qty 1

## 2021-12-06 MED ORDER — QUETIAPINE FUMARATE 50 MG PO TABS
50.0000 mg | ORAL_TABLET | Freq: Every evening | ORAL | Status: DC
Start: 2021-12-06 — End: 2021-12-07
  Administered 2021-12-06 – 2021-12-07 (×2): 50 mg via ORAL
  Filled 2021-12-06 (×2): qty 1

## 2021-12-06 MED ORDER — IBUPROFEN 400 MG PO TABS
400.00 mg | ORAL_TABLET | Freq: Once | ORAL | Status: AC
Start: 2021-12-06 — End: 2021-12-06
  Administered 2021-12-06: 400 mg via ORAL
  Filled 2021-12-06: qty 1

## 2021-12-06 MED ORDER — HEPARIN SODIUM (PORCINE) 5000 UNIT/ML IJ SOLN
5000.0000 [IU] | Freq: Two times a day (BID) | INTRAMUSCULAR | Status: DC
Start: 2021-12-06 — End: 2021-12-17
  Administered 2021-12-06 – 2021-12-17 (×23): 5000 [IU] via SUBCUTANEOUS
  Filled 2021-12-06 (×25): qty 1

## 2021-12-06 MED ORDER — ACETAMINOPHEN 325 MG PO TABS
650.0000 mg | ORAL_TABLET | Freq: Four times a day (QID) | ORAL | Status: DC | PRN
Start: 2021-12-06 — End: 2021-12-06
  Administered 2021-12-06: 650 mg via ORAL

## 2021-12-06 MED ORDER — LIDOCAINE 4 % EX PTCH
1.0000 | MEDICATED_PATCH | CUTANEOUS | Status: DC
Start: 2021-12-06 — End: 2021-12-17
  Administered 2021-12-06 – 2021-12-17 (×11): 1 via TOPICAL
  Filled 2021-12-06 (×12): qty 1

## 2021-12-06 MED ORDER — ACETAMINOPHEN 325 MG PO TABS
650.0000 mg | ORAL_TABLET | Freq: Four times a day (QID) | ORAL | Status: DC
Start: 2021-12-06 — End: 2021-12-08
  Administered 2021-12-06 – 2021-12-08 (×7): 650 mg via ORAL
  Filled 2021-12-06 (×7): qty 2

## 2021-12-06 MED ORDER — NAPROXEN 250 MG PO TABS
500.0000 mg | ORAL_TABLET | Freq: Two times a day (BID) | ORAL | Status: DC
Start: 2021-12-06 — End: 2021-12-06

## 2021-12-06 MED ORDER — TORSEMIDE 20 MG PO TABS
40.00 mg | ORAL_TABLET | Freq: Once | ORAL | Status: AC
Start: 2021-12-06 — End: 2021-12-06
  Administered 2021-12-06: 40 mg via ORAL
  Filled 2021-12-06: qty 2

## 2021-12-06 NOTE — Progress Notes (Signed)
PROGRESS NOTE  12/06/2021    PATIENT INFO: Cheryl Zimmerman, [age] year old female  [place]: 12/05/21    ROOM/BED LOCATION:  610/610-A  HOSPITAL DAY: 0    REVIEW OF OVERNIGHT EVENTS:  Transfer/admission from psych, see HPI    SUBJECTIVE:  Pt notes L knee pain is still present, though can bend knee slightly more and overall pain is improved from yesterday  Has some trouble breathing while lying flat at night, which is at baseline per pt    OBJECTIVE:    VITAL SIGNS    12/06/21  0515 12/06/21  0822 12/06/21  0946 12/06/21  1250   BP: 135/86 129/65     Pulse: 96      Resp: 18  18 18    Temp: 98.3 F (36.8 C)      TempSrc: Oral      SpO2: 91%      Weight:       Height:           INS/OUTS (PAST 24 HOURS)  I/O 24 Hrs:  In: 360 [P.O.:360]  Out: 300 [Urine:300]    PHYSICAL EXAM   GEN:      NAD   CV:      RRR, no m/r/g  PULM:      CTAB  ABD:      Soft, NTND  EXT:      Pitting edema 2+ to mid-shin b/l  MSK:  Mild swelling L knee, moderately TTP most notable at anteromedial joint space,  ROM limited to <45 deg. Due to pain, not erythematous, no fluctuance  SKIN:      No visible rash  NEURO:    Alert and oriented- neuro exam limited by habitus and pain, though grossly normal sensation lower extremities b/l    RECENT LABS  BMP:   Recent Labs     12/05/21  1724 12/06/21  0740   NA 142 140   K 3.6 4.1   CL 99 99   CO2 31 29   BUN 15 16   CREAT 0.8 0.8   GLUCOSER 127 106     Ca, Mg, Phos:   Recent Labs     12/05/21  1724 12/06/21  0740   CA 9.4 9.5     CBC:   Recent Labs     12/05/21  1724   WBC 8.8   HCT 46.9*   HGB 14.7   PLTA 268   RBC 4.75     Coagulation Labs: No results for input(s): PT, INR in the last 72 hours.    Invalid input(s): PTT  LFTs: No results for input(s): AST, ALT, TBILI, DBILI, ALKPHOS in the last 72 hours.  Pancreatic Enzymes: No results for input(s): AMY, LIP in the last 72 hours.  Urinalysis:No results for input(s): UACOL, UACLA, UAGLU, UABIL, UAKET, SPEGRAVURINE, UAOCC, UAPH, UAPRO,  UANIT, LEUKOCYTES in the last 72 hours.    Invalid input(s): UAOBU  Troponin:  TROPONIN T HS RANDOM (ng/L)   Date Value   11/30/2021 17 (H)     DELTA 1 HOUR TROPONIN T HS (ng/L)   Date Value   11/24/2021 0     DELTA 3 HOUR TROPONIN T HS (ng/L)   Date Value   11/24/2021 1           MICROBIOLOGY REVIEW  No new microbiology results to review.    IMAGING AND OTHER STUDIES  XR L hip:  Impression:     No visible fracture  in this single view. If symptoms persist then a complete hip study is suggested.     CT head w/out contrast:  IMPRESSION:     No CT evidence of acute intracranial abnormality.      XR Knee L:    Impression:     Normal left knee radiographs.         MEDICATIONS  See list in EPIC    ASSESSMENT & PLAN   51 year-old female with history ofhousing instability,bipolar disorder, schizoaffective disorder, substance use disorder,PTSD, multiple suicide attempts,systolic congestive heart failure, seizure disorder on keppra, complex pain syndrome, COPD, bronchial asthma, and obesity, presenting with suicidal ideationadmitted to the inpatient Psychiatry unit for management of suicidal ideation, now resolved. Patient is unable to use walker due to L knee pain and required wheelchair 3/19 at [facility]. Wheelchair is unable to fit through doors at [employer]. Admission to medicine for further monitoring and pain control.    #Mechanical fall  #LLE pain  Patient sustained mechanical fall 3/19 while using walker. Pt notes L knee "giviing out" which on chart review has occurred in past.Pt notes L knee "hurt" when gave out but no pop/tear anything pt could appreciate. L knee also hurt from contact w/ floor.  Head CT did not show any acute intracranial pathology and L knee XR and L hip XR did not show any fractures.   Neuro exam 3/20 difficult to assess and limited 2/2 pain but grossly WNL. Mildly swollen though no other joint deformity or concerning features, most likely etiology of pain is swelling from trauma.  Difficult to assess ligamentous damage as exam limited. If lack of improvement may consider imaging.   3/20 Pt unable to bear weight when working w/ PT  -Pain: Tylenol 650 q6hr, naproxen 500 BID, diclofenac gel, lidocaine patch, ice pack (& home suboxone, see below)  -appreciate PT recs    #Complex chronic pain syndrome  #Lumbar spinal stenosis  #Sciatica:  -Buprenorphine-naloxone  -Fluoxetine  -Gabapentin  -Methocarbamol  -Maintain activity as able  [ ]  Weight reduction strategies as able    #Suicidal ideation  #Schizoaffective disorder, bipolar type  #PTSD  #Anxiety  #Grief reaction  #History of suicide attempts  -Resolved per psych eval, no need for 1:1 or to return to psych once medically optimized.  -Continue current regimen: Fluoxetine, loxapine, gabapentin, prazosin, melatonin, and as needed lorazepam, haloperidol.  [ ]  outpatient psych f/u needed w/ outpatient psychiatrist  [ ]  may benefit from rehab, VNA, housing support    #Substance use disorder  #History of alcohol withdrawal, alcohol use disorder  #h/o housing instability  No current withdrawal  -Acamprosate  -Buprenorphine-naloxone  - unclear history, though per chart review has been noted to use opioids (though pt reports no opioid use other than suboxone for chronic pain); also documentation of cocaine use.  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 Felt to be volume overloaded d/t acute decompensated HF iso decreased diuretic dose (see d/c sum) . D/c'd on 80 torsemide BID, discharge weight  ~330s though unclear? If these are bed weights or standing.  Notably, BNP was 40s at this time, whereas during exacerbation in 2022 requiring IV diuresis NT proBNP 2000. Weight at discharge 2022 300s.   Current weight 340s but again bed weights difficult to rely on heavily, and pt unable to stand currently. Pt with 2+ pitting edema mid shin b/l though has some baseline LE pitting edema- has baseline orthopnea though no worsening, no other signs of volume overload on exam though exam limited. Challenging weights and volume status, though wonder if pt is chronically overloaded.  Etiology of HF unclear (though possibly alcohol + cocaine use contributory?)  Patient also reports that her "O2 levels decreased at night" and reports that she has sleep apnea but no documented or reported history of CPAP use or sleep study.   -Budesonide-formoterol  -Fluticasone nasal spray  -Review and consider the addition of a long-acting antimuscarinic agent for COPD maintenance regimen  -Torsemide80 mg twice per day; additional 40 mg torsemide 3/20 AM, strict Is/Os (purewick in place)  -Nicotine replacement therapy and smoking cessation support  -Rescue albuterol as needed  [ ] will consider outpatient sleep study  [ ] Recommend outpatientPulmonology follow    #Hypokalemia, resolved  Potassium3.2in the morning of 3/16. Magnesium was 2. This is in the context of high-dose diuretics, s/p supplementation  -BMP trend    #Dyslipidemia:   LDL 111  -Atorvastatin  -Low-dose aspirin    #Seizure disorder  -Continue levetiracetam, gabapentin, oxcarbazepine    #Tobacco use disorder:  -Transdermal nicotine as needed  - nicotine gum as needed  [ ]  Consider ongoing pharmacologic treatment and connection with behavioral modification program    #Hepatitis C  -Hep C PCR pending    #Hypothyroidism:History of levothyroxine use.TSH normal on 11/24/2021.  -Levothyroxine    #Atrial fibrillation? (pt self reported?)  #question of  stroke?  NSR on admission; History is tricky as most info is from incomplete OSH records, though seems like clinically may have presented with stroke coupe + yrs ago, and has had more than one presentation with concern for stroke, though most recently had CT and MRI that were without acute stroke.  If afib observed on tele may consider doac  -Low-dose aspirin, atorvastatin  -Continue to monitorrhythm    #Gastroesophageal reflux disease:  -Pantoprazole  -Antireflux hygiene    #Bee VenomAllergy: History of anaphylaxis  [ ]   prescription for epinephrine pen and teaching at d/c     Diet: cardiac diet    DVT Prophylaxis: SQ hep    Medication Reconciliation: Done - continued meds from [facility], followed by medicine  Was medication list from nursing facility collected & reviewed? n/a  Changes from home medication regimen: as above.    Code Status: Full Code  Health care proxy: Data Unavailable  Data Unavailable    Dispo: Admission for pain control and monitoring

## 2021-12-06 NOTE — Consults (Signed)
Pt discussed during MDR, pt is a transfer from inpatient psych. Per chart review, pt is psychiatrically cleared, anticipate that pt will need STR upon medical clearance.    Pt is from a [facility] group home [facility]) [phone], however, pt does not want to return will prefer to go to a different group home. SW called group home, left a voicemail for [facility] group home manager to further discuss. Per conversation with Tania on 3/14, Tania wanted to have a discussion with pt and [place] so that options can be discussed re: finding a different group home since it took pt a long time to be placed.    SW met with pt who agreed to have a phone discussion with Tania and [place]. SW awaiting call back from [facility]. SW will remain available as needed.

## 2021-12-06 NOTE — Consults (Signed)
CONSULT-LIAISON PSYCHIATRY INITIAL CONSULT      ID: [name] admitted on 12/05/21   for s/p mechanical fall     Consult Question: "SI precautions"    History of Present Illness:   58F with psych hx PTSD, SCAD, AUD and med hx COPD, CHF who is known to this service - she was sectioned in the ED on 3/6 due to worsening SI, tried to strangulate herself with O2 tubing, was admitted to medicine at that time for an increased oxygen need. While medically admitted psych was consulted to give recommendations on SI precautions.     She was admitted to [facility] from 3/16-3/19, returned to medicine s/p mechanical fall with head strike. While admitted, the patient signed in voluntarily, remained in good behavior control, and did not require any restraints. Med changes included d/cing home haldol, initiation of loxapine 25mg  BID, d/c duloxetine and started on fluoxetine 20mg , d/c doxepin.     Currently, imaging including CT head w/o contrast and XR L hip are clear. The patient is working with PT for mobility and strength.     On interview the patient is pleasant and cooperative and remembers this from last week. She reports not wanting to return to psych inpatient after medical stabilization - has had no thoughts of suicide for the past few days, and is overall feeling better. She states her new medications as well as being on the unit were helpful. She denies HI. She also denies AVH recently, and notes that her nightmares have improved.     She reports that she is considering living with a friend after discharge and wants to work on finding housing for herself, as she does not want to return to her prior [facility] group home. Also is willing to go to STR to work on her strength and mobility.     States that she would like to continue working with her outpatient psychiatrist, last seen 1 month ago - unable to recall the provider's name but provides a phone number [phone] from her cell phone (this  writer attempted to call, VM was not set up).       Past Psychiatric History: please see prior evaluation   Current Psychiatric Treatment: please see prior evaluation   Psychiatric Hospitalizations: please see prior evaluation   Suicide attempt/Self Injurious Behavior: please see prior evaluation       Violence Risk  History of Violence?: No  Current Attempt to Harm: No  Homicidal Ideation?: No  Homicidal Ideation With Intent?: No  Self Destruction Behaviors: Denies  Self Inflicted Injury?: Denies  Aggression/Poor Impulse Control?: Denies    Past Medical History:  Past Medical History:  schizo: Bipolar affective ([name])  No date: Chronic systolic CHF (congestive heart failure) ([name])  No date: Depression  depress: Schizo affective schizophrenia ([name])  No date: Substance abuse ([name])    History of Head Trauma: yes    Outpatient Medications:  Prior to Admission Medications   Prescriptions Last Dose Informant Patient Reported? Taking?   DULoxetine 40 MG CPEP   No Yes   Sig: Take 40 mg by mouth daily   EPINEPHrine (EPIPEN JR 1:2000) 0.15 MG/0.15ML injection   Yes Yes   Sig: Inject 0.15 mg into the muscle as needed   LORazepam (ATIVAN) 1 MG tablet   No Yes   Sig: Take 1 tablet by mouth 2 (two) times daily as needed   OXcarbazepine (TRILEPTAL) 150 MG tablet   Yes Yes  Sig: Take 150 mg by mouth in the morning and 150 mg before bedtime.   QUEtiapine (SEROQUEL) 50 MG tablet   No Yes   Sig: Take 1 tablet by mouth every 6 (six) hours as needed   acamprosate (CAMPRAL) 333 MG tablet   Yes Yes   Sig: Take 666 mg by mouth in the morning and 666 mg at noon and 666 mg before bedtime.   albuterol HFA 108 (90 Base) MCG/ACT inhaler   Yes Yes   Sig: Inhale 2 puffs into the lungs every 4 (four) hours as needed for Wheezing or Shortness of breath   aspirin 81 MG chewable tablet   No Yes   Sig: Take 1 tablet by mouth in the morning.   atorvastatin (LIPITOR) 80 MG tablet   Yes Yes   Sig: Take 80 mg by mouth nightly   budesonide-formoterol  (SYMBICORT) 160-4.5 MCG/ACT inhaler   No Yes   Sig: Inhale 2 puffs into the lungs in the morning and 2 puffs before bedtime.   buprenorphine-naloxone (SUBOXONE) 8-2 MG sublingual tablet   No Yes   Sig: Place 1 tablet under the tongue in the morning and 1 tablet at noon and 1 tablet before bedtime. Max Daily Amount: 3 tablets.   diclofenac (VOLTAREN) 1 % GEL Gel   No Yes   Sig: Apply 4 g topically 4 (four) times daily as needed (knee pain)   docusate sodium (COLACE) 100 MG capsule   Yes Yes   Sig: Take 100 mg by mouth daily as needed for Constipation   ferrous gluconate (FERGON) 324 MG tablet   Yes Yes   Sig: Take 324 mg by mouth 3 (three) times a week Monday, Wednesday, Friday   ferrous sulfate 324 (65 Fe) MG TBEC EC tablet   No Yes   Sig: Take 1 tablet by mouth every other day   fluticasone (FLONASE) 50 MCG/ACT nasal spray   Yes Yes   Sig: 1 spray by Each Nostril route 2 (two) times daily   gabapentin (NEURONTIN) 300 MG capsule   Yes Yes   Sig: Take 600 mg by mouth in the morning and 600 mg at noon and 600 mg before bedtime.   haloperidol (HALDOL) 5 MG tablet   No Yes   Sig: Take 1 tablet by mouth in the morning and 1 tablet at noon and 1 tablet before bedtime.   levETIRAcetam (KEPPRA) 750 MG tablet   Yes Yes   Sig: Take 750 mg by mouth in the morning and 750 mg before bedtime.   levothyroxine (SYNTHROID) 25 MCG tablet   Yes Yes   Sig: Take 25 mcg by mouth every morning before breakfast   loratadine (CLARITIN) 10 MG tablet   Yes Yes   Sig: Take 10 mg by mouth in the morning.   melatonin 3 MG TABS tablet   No Yes   Sig: Take 1 tablet by mouth nightly   methocarbamol (ROBAXIN) 750 MG tablet   Yes Yes   Sig: Take 750 mg by mouth in the morning and 750 mg at noon and 750 mg in the evening. Take before meals.   naproxen (NAPROSYN) 250 MG tablet   No Yes   Sig: Take 1 tablet by mouth 2 (two) times daily as needed for Pain   nicotine (NICODERM CQ) 21 MG/24HR   No Yes   Sig: Place 1 patch onto the skin in the morning.    nicotine polacrilex (NICORETTE) 2 MG gum   No  Yes   Sig: Take 1 each by mouth every hour as needed for Craving (Nicotine)   nystatin (MYCOSTATIN) cream   Yes Yes   Sig: Apply topically 2 (two) times daily as needed (Rash, redness)   nystatin (MYCOSTATIN) powder   Yes Yes   Sig: Apply topically 2 (two) times daily as needed (Rash, redness)   omeprazole (PRILOSEC) 40 MG capsule   Yes Yes   Sig: Take 40 mg by mouth in the morning.   ondansetron (ZOFRAN-ODT) 4 MG disintegrating tablet   Yes Yes   Sig: Take 4 mg by mouth every 8 (eight) hours as needed for Nausea   potassium chloride SA (K-DUR) 20 MEQ tablet   Yes Yes   Sig: Take 20 mEq by mouth in the morning and 20 mEq before bedtime.   prazosin (MINIPRESS) 2 MG capsule   No Yes   Sig: Take 1 capsule by mouth nightly   sennosides (SENOKOT) 8.6 MG tablet   No Yes   Sig: Take 2 tablets by mouth nightly as needed for Constipation (secondline)   thiamine (VITAMIN B-1) 100 MG tablet   No Yes   Sig: Take 1 tablet by mouth daily.   torsemide (DEMADEX) 20 MG tablet   No Yes   Sig: Take 3 tablets by mouth in the morning and 3 tablets before bedtime.      Facility-Administered Medications: None       Inpatient Medications:  Scheduled medications:   heparin (porcine)  5,000 Units Subcutaneous Q12H SCH    lidocaine  1 patch Topical Q24H    naproxen  500 mg Oral BID    acetaminophen  650 mg Oral Q6H SCH    acamprosate  666 mg Oral TID    aspirin  81 mg Oral Daily    atorvastatin  80 mg Oral Nightly    budesonide-formoterol  2 puff Inhalation BID    buprenorphine-naloxone  8 mg Sublingual TID    docusate sodium  100 mg Oral BID    ferrous gluconate  324 mg Oral Once per day on Mon Wed Fri    FLUoxetine  20 mg Oral Daily    fluticasone  1 spray Each Nostril BID    gabapentin  600 mg Oral TID    levETIRAcetam  750 mg Oral BID    levothyroxine  25 mcg Oral DAILY    loxapine  25 mg Oral BID    melatonin  3 mg Oral Nightly    methocarbamol  750 mg Oral TID AC     nicotine  1 patch Transdermal Daily    OXcarbazepine  150 mg Oral BID    pantoprazole  40 mg Oral Daily    potassium chloride SA  20 mEq Oral BID    prazosin  2 mg Oral Nightly    sennosides  17.2 mg Oral Nightly    thiamine  100 mg Oral Daily    torsemide  80 mg Oral BID       As needed medications:   Current Facility-Administered Medications   Medication Dose Route Frequency Last Admin    QUEtiapine  50 mg Oral Q6H PRN      albuterol HFA  2 puff Inhalation Q4H PRN      aluminum-magnesium hydroxide-simethicone  30 mL Oral Q2H PRN      diclofenac  4 g Topical 4x Daily PRN 4 g at 12/06/21 1140    eucerin   Topical Q12H PRN      haloperidol  5 mg Oral Q4H PRN      LORazepam  2 mg Oral Q4H PRN      magnesium hydroxide  30 mL Oral Daily PRN      nicotine polacrilex  2 mg Oral Q1H PRN 2 mg at 12/06/21 1253    nystatin   Topical BID PRN            Allergies:  Review of Patient's Allergies indicates:   Bee venom               Anaphylaxis   Carbamazepine           Hives   Methylprednisolone      Dizziness, Drowsiness    Comment:Also believes syncope. Seems to tolerate             inhalational and epidural steroid.   Nutritional supplem*       Hydroxyzine             Nausea Only    Social History/Substance Abuse:  Social History    Socioeconomic History      Marital status: Divorced      Spouse name: Not on file      Number of children: Not on file      Years of education: Not on file      Highest education level: Not on file    Occupational History      Not on file    Tobacco Use      Smoking status: Every Day        Packs/day: 3.00        Years: 30.00        Pack years: 53        Types: Cigarettes      Smokeless tobacco: Never    Substance and Sexual Activity      Alcohol use: Yes      Drug use: Yes        Types: Marijuana      Sexual activity: Yes        Partners: Male    Other Topics      Concerns:        Not on file    Social History Narrative      Not on file    Social Determinants of Health  Financial  Resource Strain: Not on file  Food Insecurity: Not on file  Transportation Needs: Not on file  Physical Activity: Not on file  Stress: Not on file  Social Connections: Not on file  Intimate Partner Violence: Not on file  Housing Stability: Not on file  Psychosocial  Admitted From:: Other (comment) [facility]  3 - psych)  Challenges: Psychosocial issues;Substance use;Low/high health care utlization history;Personal or family crisis  Issues Related To:: No issues  Relationship Status: Single  Primary Caretaker for Someone: No  Providing self care at home?: No (from [place] [facility]..)  *Religious Affiliation: [employer]  Needs Expressed: Physical  Demographics  *Religious Affiliation: [employer]  Relationship Status: Single  Living Situation  Lives With: Roommate  Type of Residence: Group home  Living Setting: Group home (group home based on notes)            Mental Status Exam:  Mental Status Exam   General Appearance: Clean;Dressed in hospital gown/johnny  Behavior: Cooperative  Level of Consciousness: Alert  Orientation Level: Grossly Intact  Attention/Concentration: WNL  Mannerisms/Movements: No abnormal mannerisms/movements  Speech Quality and Rate: WNL  Speech Clarity: Clear  Speech Tone: Normal vocal inflection  Vocabulary/Fund of Knowledge: WNL  Memory: Grossly intact  Thought Process & Associations: Goal-directed;Logical;Organized;Linear  Dissociative Symptoms: None  Thought Content: No abnormalities reported or observed  Delusions: None  Hallucinations: None  Suicidal Thoughts: None  Homicidal Thoughts: None  Mood: Euthymic;Content  * Mood comment: "Better"  Affect: Congruent with mood  Judgment: Fair  Insight: Fair    Vitals:  BP 129/82    Pulse 88    Temp 98.1 F (36.7 C) (Oral)    Resp 18    Ht  (1.753 m)    Wt (!) 158 kg (348 lb 4.8 oz)    SpO2 91%    BMI 51.43 kg/m     Pertinent Labs/Studies: Results are as follows:  BMP:   Recent Labs     12/05/21  1724 12/06/21  0740   NA 142 140   K 3.6 4.1   CL 99 99    CO2 31 29   BUN 15 16   CREAT 0.8 0.8   GLUCOSER 127 106     Ca, Mg, Phos:   Recent Labs     12/05/21  1724 12/06/21  0740   CA 9.4 9.5     CBC:   Recent Labs     12/05/21  1724   WBC 8.8   HGB 14.7   HCT 46.9*   PLTA 268   RBC 4.75     Coagulation Labs: No results for input(s): PT, APTT, INR in the last 72 hours.  LFTs: No results for input(s): AST, ALT, TBILI, DBILI, ALKPHOS, ALBUMIN in the last 72 hours.  Pancreatic Enzymes: No results for input(s): AMY, LIP in the last 72 hours.  Urinalysis:No results for input(s): UACOL, UACLA, UAGLU, UABIL, UAKET, SPEGRAVURINE, UAOCC, UAPH, UAPRO, UANIT, LEUKOCYTES in the last 72 hours.    Invalid input(s): UAOBU  Troponin: No results for input(s): TROPI in the last 72 hours.  NT-proBNP:   Recent Labs     12/06/21  0740   PROBNP 52     Lactic acid: No results for input(s): LACTICACID in the last 72 hours.    Imaging:  Exam: Left Hip, one view portable, 1:03 AM     Indication: Left lower extremity numbness, tingling s/p mechanical fall earlier today     Comparison: None     Findings:     Bones and Joints: There is no fracture in this single view. There is no dislocation. There is narrowing of left hip joint space.     Soft Tissues: There is vascular calcification.      Impression:     No visible fracture in this single view. If symptoms persist then a complete hip study is suggested.         Reviewed and Electronically Signed by: Lavella Lemons MD   Signed Date/Time: 12-06-2021 07:58:27        CLINICAL INDICATION: Head trauma, abnormal mental status (Age 16-64y)     COMPARISON: none     TECHNIQUE: Contiguous scans from skull base to vertex without contrast and reconstructions in coronal plane. Radiation dose reduction techniques were employed. CTDIvol: 57.8 mGy. DLP: 1024 mGy-cm.      FINDINGS:     Brain: Gray-white differentiation is maintained. There is no hemorrhage or abnormal mass effect.     Ventricles and CSF spaces: Normal in size and morphology.     Sinuses:  There is mild mucoperiosteal thickening of the left frontal sinus.     Mastoid air cells: Clear.  Orbits: Unremarkable.     Bones: The calvarial vault and skull base are intact.      Extracranial: Unremarkable     IMPRESSION:     No CT evidence of acute intracranial abnormality.     Preliminary report provided at the time of the study by Dr. William Hamburger of [facility].        Exam: Left knee, 4 views     Indication: knee pain, recent fall     Comparison: None     Findings:     Bones and Joints: There is no fracture or suspicious bone lesion. There is no dislocation. The joint spaces are maintained. No joint effusion is seen.     Soft Tissues: Unremarkable.      Impression:     Normal left knee radiographs.           Reviewed and Electronically Signed by: Lennie Hummer MD   Signed Date/Time: 12-05-2021 19:42:47     Collateral Information:  I reviewed the patient chart and discussed the patient with the referring team.    Assessment:   77F, English-speaking, charted diagnoses of PTSD, SCAD, BPAD, PSUD (mainly alcohol), multiple prior psych hospitalizations, multiple prior suicide attempts and SIB requiring medical attention, hx head trauma with seizure disorder, med hx COPD and CHF, has [facility] services who returns to medical floor from psych admission s/p mechanical fall with head strike now working with PT for mobility and strength now with psych consulted for SI precauations     The patient is now denying thoughts and plans of SI, reports that her disturbing hallucinations from last week have improved. She has been in behavioral control while admitted to psych as well as the days before that admission. She is at chronic increased risk of harm to self due to history of multiple attempts (including one this month), lack of social supports. However, this is mitigated by her help-seeking behavior and cooperation with care. She has also not exhibited any self-injurious behaviors for over a week now, as well as being able to safety  plan, including willingness to remain in the hospital for care and being agreeable with discharge to STR. At the moment she does not meet Section 12 criteria for an involuntary hold.     DSM-V    Primary Diagnosis: PTSD (post-traumatic stress disorder)   Secondary Diagnosis: Depressive disorder          Plan:   - continue with psych meds as indicated on d/c summary from last inpatient psychiatry admission  - no need for safety watch; patient does not meet S12 criteria  - please have patient schedule appointment with her outpatient psychiatrist  - dispo to STR is reasonable   - we will continue to follow     Richardean Canal, MD  Psychiatry PGY-2, [phone]

## 2021-12-06 NOTE — Plan of Care (Signed)
Problem: Safety:  Goal: Will remain free from falls throughout the hospitalization,  Outcome: Progressing  Note: Bed alarm on. Call bell within reach. Pt instructed to use and demonstrates ability. Safety tray ordered.

## 2021-12-06 NOTE — Plan of Care (Addendum)
A&Ox3. VSS, afebrile. Purewick draining CYU. C/o pain to LLE, and headache, managed per [facility]. No c/o nausea. Fall precautions in place. Instructed to use call bell when needing assistance. Will continue to monitor.   Problem: Activity:  Goal: Mobility will be supported throughout the hospitalization,  Outcome: Progressing     Problem: Cognitive:  Goal: Caregivers and patients knowledge of risk factors and measures for prevention of condition will be supported throughout the hospitalization  Outcome: Progressing     Problem: Daily Care  Goal: Daily care needs are met  Description: Assess and monitor ability to perform self care and identify potential discharge needs.  Outcome: Progressing     Problem: Pain  Goal: Patient's pain/discomfort is manageable  Description: Assess and monitor patient's pain using appropriate pain scale. Collaborate with interdisciplinary team and initiate plan and interventions as ordered. Re-assess patient's pain level 30 - 60 minutes after pain management intervention.   Outcome: Progressing     Problem: Compromised Skin Integrity  Description: Use this problem when pressure ulcers are classified as stage I or II.  Goal: Skin integrity is maintained or improved  Description: Assess and monitor skin integrity. Identify patients at risk for skin breakdown on admission and per policy. Collaborate with interdisciplinary team and initiate plans and interventions as needed.  Outcome: Progressing

## 2021-12-06 NOTE — Progress Notes (Addendum)
Pt's case discussed at [facility]. [place] [facility] [facility]  She states she is unable to ambulate as her knee is hurt, so she is using a w/c for mobility.  Both PT and psych will consult with pt today.[facility] has cleared pt  so she will not return, and PT recommends STR.  Met pt at bedside... Will put referrals in. Dispo - STR when medically cleared.

## 2021-12-06 NOTE — RN Shift Note (Signed)
Pt admitted to [facility] from [place] [facility] s/p fall and pt having difficulty ambulating. Pt c/o rt knee/leg pain. Po Tylenol and Naproxen given and Voltaren cream. Lidocaine patch applied to knee with some affect. Skin c/d/i.  Pt able to stand and pivot from w/c to bed.

## 2021-12-06 NOTE — Consults (Signed)
COLLATERAL -     Per Tania at [facility] group home [phone]):    Patient's psychiatrist is through [place]. Hasn't been at [facility] since she eloped on 11/15/2021. Tania confirms that they have not closed the patient's file. It took the patient 1.5 years to get off a waitlist due to her medical comorbidities, and she moved into the group home in November.     Previously had a female prescriber, recently switched over to new prescriber:    Roanna Raider, email is [email or web address] .[facility] phone #: [phone]       Could not reach the provider today, will try again tomorrow.    Richardean Canal, MD  Psychiatry PGY-2, [phone]

## 2021-12-06 NOTE — Progress Notes (Signed)
Nurse case manager aware that patient has discharged to the medical floor, contacted [facility] provider line and left message of patient transfer.  Anticipate that she'll need a new auth to return to [facility] psych.     12/06/21 1330   Discharge Reviewed   Reviewed for discharge Yes   UR Completed? Yes   Final Discharge Plan    Was this a  3 day waiver? No   Discharge disposition [place] Transfer to acute care hospital   Transfer to [facility] (specify) [phone])   Other [facility]) [facility]   Transportation at Discharge Other (comment)

## 2021-12-06 NOTE — Initial Assessments (Signed)
Inpatient Physical Therapy Initial Evaluation    S: "My knee really hurts"    O: PT initial evaluation completed today. Please see Vital Sign and Pain Assessment flowsheets for vitals and pain documentation.  Plan of care has been reviewed and updated as necessary.     12/06/21 1000   Language Information   Language of Care English   Evaluation Type   Evaluation Type Initial Evaluation   Rehab Discipline   Rehab Discipline PT   Safety Devices   Type of Devices Call bell in place;Bed alarm in place   Weight Bearing Status   RLE FWB   LLE FWB   RUE FWB   LUE FWB   Services prior to admission?   Type of Home Care Services None   Premorbid Mobility   Transfers Independent   Walking Independent;Used assistive device   Walking assistive devices used Rollator   ADL / IADL Baseline Status   ADL/IADL Baseline Status Yes   ADL's/IADL's   Dressing Independent   Bathing Independent   Toileting Independent   Living Situation   Living Setting Group home  (group home based on notes)   Strengths   Strengths Premorbid level of function   Barriers   Barriers Comorbidities;Attitude of self   Sensation   Light Touch No apparent deficits   RLE Assessment   RLE Assessment WFL   LLE Assessment   LLE Assessment X   Strength LLE   LLE Overall Strength Deficits;Due to pain   Mobility / Balance   Mobility / Balance Yes   Bed Mobility   Supine to Sit Min assist to left 1   Rolling Independent   Sit to Supine Mod assist to right 1   Transfers   Transfer Yes   Transfer 1   Transfer From 1 Bed   Transfer Type 1 To and from   Transfer to 1 Stand   Technique 1 Sit to stand;Stand to sit   Transfer Device 1 Rolling walker (Front wheel walker)   Transfer Level of Assistance 1 Contact guard   Trials/Comments 1 x2   Transfers 2   Transfer From 2 Stand   Transfer Type 2 To and from   Transfer to 2 Chair with arms   Technique 2 Stand pivot;To right;To left   Transfer Device 2 Rolling walker (Front wheel walker)   Transfer Level of Assistance 2 Contact  guard   Trials/Comments 2 x1, unsteady, pain with weightbearing   Gait   Gait No   Balance   Sitting - Static Sits without support for > 30 sec;Feet supported   Sitting - Dynamic Moves/returns truncal midpoint 1-2 inches in multiple planes;Feet supported   Standing - Static Bilateral upper extremity supported   Standing - Dynamic Lateral lean   Posture Rounded shoulders;Forward head;Kyphosis   Plan   Prognosis Good   PT Frequency 5x/wk   Recommendation   Recommendation Short-term skilled PT   Equipment Recommended Rolling walker (Front wheel walker)     A: Pt is a 51 year old female with PMH including schizoaffective d/o, obesity, anxiety, seizure d/o, hypokalemia, complex chronic pain syndrome admitted for fall with headstrike while ambulating on psych unit. Knee and hip rays negative for acute fracture, head CT negative. PT consult received, medical chart reviewed.    On assessment, pt limited by c/o pain. Able to complete fair quad set to LLE but unable to actively mobilize d/t pain without assistance. Required minA for bed mobility d/t need for assist  to move LLE. Sit to stand transfers, stand pivot transfers completed with CGA using RW with cues for sequencing. Pt initially requesting to sit up in chair, but noted to be lethargic so assisted back to bed. Required modA to return to supine. Able to boost self up in bed.     Patient appears well below her functional baseline with mobility related tasks iso pain. May benefit from CT to rule out further injury. Patient will benefit from skilled PT to address mobility impairments and functional limitations.    PT Short Term Goals:  Patient will perform bed mobility with supervision in 4 treatments.  Patient will perform functional transfers with supervision in 4 treatments.  Patient will ambulate 15 feet with contact guard assistance with rolling walker (front wheel walker) in 4 treatments.    PT Long Term Goals:  Patient will perform bed mobility independently in 8  treatments.  Patient will perform functional transfers independently in 8 treatments.  Patient will ambulate 100 feet independently with rolling walker (front wheel walker) in 8 treatments.    P: PT 5 times a week while inpatient for transfer training, gait training, and therapeutic exercise as tolerated. Recommend STR upon d/c.    Melvern Ramone C. Doristine Counter, PT, Lic # [id number]

## 2021-12-07 ENCOUNTER — Observation Stay (HOSPITAL_BASED_OUTPATIENT_CLINIC_OR_DEPARTMENT_OTHER): Payer: No Typology Code available for payment source

## 2021-12-07 DIAGNOSIS — I5020 Unspecified systolic (congestive) heart failure: Secondary | ICD-10-CM

## 2021-12-07 DIAGNOSIS — R0600 Dyspnea, unspecified: Secondary | ICD-10-CM

## 2021-12-07 LAB — PHOSPHORUS MAGNESIUM
MAGNESIUM: 2 mg/dL (ref 1.6–2.6)
PHOSPHORUS: 4.5 mg/dL (ref 2.5–4.9)

## 2021-12-07 LAB — BASIC METABOLIC PANEL
ANION GAP: 10 mmol/L (ref 10–22)
BUN (UREA NITROGEN): 18 mg/dL (ref 7–18)
CALCIUM: 9.8 mg/dL (ref 8.5–10.5)
CARBON DIOXIDE: 33 mmol/L — ABNORMAL HIGH (ref 21–32)
CHLORIDE: 100 mmol/L (ref 98–107)
CREATININE: 0.8 mg/dL (ref 0.4–1.2)
ESTIMATED GLOMERULAR FILT RATE: >60 mL/min (ref >60)
Glucose Random: 116 mg/dL (ref 74–160)
POTASSIUM: 4.1 mmol/L (ref 3.5–5.1)
SODIUM: 143 mmol/L (ref 136–145)

## 2021-12-07 LAB — ECHOCARDIOGRAM W/ ULTRASOUND CONTRAST: LVEF: 70 %

## 2021-12-07 MED ORDER — DULOXETINE HCL 20 MG PO CPEP
40.0000 mg | ORAL_CAPSULE | Freq: Every day | ORAL | Status: DC
Start: 2021-12-08 — End: 2021-12-07

## 2021-12-07 MED ORDER — HALOPERIDOL 5 MG PO TABS
5.0000 mg | ORAL_TABLET | Freq: Three times a day (TID) | ORAL | Status: DC
Start: 2021-12-07 — End: 2021-12-07

## 2021-12-07 MED ORDER — QUETIAPINE FUMARATE 50 MG PO TABS
50.0000 mg | ORAL_TABLET | Freq: Every evening | ORAL | Status: DC
Start: 2021-12-07 — End: 2021-12-07

## 2021-12-07 MED ORDER — PERFLUTREN PROTEIN A MICROSPH IV SUSP
INTRAVENOUS | Status: AC
Start: 2021-12-07 — End: 2021-12-08
  Filled 2021-12-07: qty 3

## 2021-12-07 MED ORDER — KETOROLAC TROMETHAMINE 15 MG/ML IJ SOLN
15.00 mg | Freq: Once | INTRAMUSCULAR | Status: AC
Start: 2021-12-07 — End: 2021-12-07
  Administered 2021-12-07: 15 mg via INTRAVENOUS
  Filled 2021-12-07: qty 1

## 2021-12-07 MED ORDER — FLUOXETINE HCL 20 MG PO CAPS
20.0000 mg | ORAL_CAPSULE | Freq: Every day | ORAL | Status: DC
Start: 2021-12-08 — End: 2021-12-17
  Administered 2021-12-08 – 2021-12-17 (×10): 20 mg via ORAL
  Filled 2021-12-07 (×10): qty 1

## 2021-12-07 MED ORDER — OXYCODONE HCL 2.5 MG PO TABS
2.50 mg | ORAL_TABLET | Freq: Once | ORAL | Status: AC
Start: 2021-12-07 — End: 2021-12-07
  Administered 2021-12-07: 2.5 mg via ORAL
  Filled 2021-12-07: qty 1

## 2021-12-07 MED ORDER — TORSEMIDE 20 MG PO TABS
40.00 mg | ORAL_TABLET | Freq: Once | ORAL | Status: AC
Start: 2021-12-07 — End: 2021-12-07
  Administered 2021-12-07: 40 mg via ORAL
  Filled 2021-12-07: qty 2

## 2021-12-07 MED ORDER — QUETIAPINE FUMARATE 50 MG PO TABS
50.0000 mg | ORAL_TABLET | Freq: Every evening | ORAL | Status: DC
Start: 2021-12-08 — End: 2021-12-10
  Administered 2021-12-08 – 2021-12-09 (×2): 50 mg via ORAL
  Filled 2021-12-07 (×2): qty 1

## 2021-12-07 MED ORDER — PERFLUTREN DILUTED IV BOLUS
3.00 mL | Status: AC
Start: 2021-12-07 — End: 2021-12-08

## 2021-12-07 MED ORDER — TORSEMIDE 10 MG PO TABS
80.00 mg | ORAL_TABLET | Freq: Once | ORAL | Status: AC
Start: 2021-12-07 — End: 2021-12-07
  Administered 2021-12-07: 80 mg via ORAL
  Filled 2021-12-07: qty 1

## 2021-12-07 MED ORDER — HALOPERIDOL 5 MG PO TABS
5.0000 mg | ORAL_TABLET | ORAL | Status: DC | PRN
Start: 2021-12-07 — End: 2021-12-09

## 2021-12-07 MED ORDER — TORSEMIDE 100 MG PO TABS
100.0000 mg | ORAL_TABLET | Freq: Two times a day (BID) | ORAL | Status: DC
Start: 2021-12-08 — End: 2021-12-08
  Administered 2021-12-08: 100 mg via ORAL
  Filled 2021-12-07: qty 1

## 2021-12-07 MED ORDER — QUETIAPINE FUMARATE 50 MG PO TABS
50.0000 mg | ORAL_TABLET | Freq: Four times a day (QID) | ORAL | Status: DC | PRN
Start: 2021-12-07 — End: 2021-12-07

## 2021-12-07 NOTE — Progress Notes (Signed)
PROGRESS NOTE  12/07/2021    PATIENT INFO: Cheryl Zimmerman, 87nglish-SPEAKING 51 year old female  DATE OF ADMISSION: 12/05/21    ROOM/BED LOCATION:  610/610-A  HOSPITAL DAY: 0    REVIEW OF OVERNIGHT EVENTS:  none    SUBJECTIVE:  Pt notes L knee pain is still present, unchanged from yesterday  Has some trouble breathing while lying flat at night, which is at baseline per pt, possibly slightly worse last night    OBJECTIVE:    VITAL SIGNS      12/07/21  0513 12/07/21  0811 12/07/21  1326 12/07/21  2059   BP: 124/77  119/80 105/71   Pulse: 70  90 91   Resp: 20 20 20 20    Temp: 98.2 F (36.8 C)  97.4 F (36.3 C) 98.3 F (36.8 C)   TempSrc: Oral  Oral Oral   SpO2: 93%  94% 91%   Weight:       Height:           INS/OUTS (PAST 24 HOURS)  I/O 24 Hrs:  In: 600 [P.O.:600]  Out: 2100 [Urine:2100]    PHYSICAL EXAM   GEN:      NAD   CV:      RRR, no m/r/g  PULM:      CTAB, +/- trace bibasilar crackles  ABD:      Soft, NTND  EXT:      Pitting edema 2+ to mid-shin b/l  MSK:  Mild swelling L knee, moderately TTP most notable at anteromedial joint space,  ROM limited to <45 deg. Due to pain, not erythematous, no fluctuance  SKIN:      No visible rash  NEURO:    Alert and oriented- neuro exam limited by habitus and pain, though grossly normal sensation lower extremities b/l    RECENT LABS  BMP:   Recent Labs     12/05/21  1724 12/06/21  0740 12/07/21  0715   NA 142 140 143   K 3.6 4.1 4.1   CL 99 99 100   CO2 31 29 33*   BUN 15 16 18    CREAT 0.8 0.8 0.8   GLUCOSER 127 106 116     Ca, Mg, Phos:   Recent Labs     12/05/21  1724 12/06/21  0740 12/07/21  0715   CA 9.4 9.5 9.8   MG  --   --  2.0   PHOS  --   --  4.5     CBC:   Recent Labs     12/05/21  1724   WBC 8.8   HCT 46.9*   HGB 14.7   PLTA 268   RBC 4.75     Coagulation Labs: No results for input(s): PT, INR in the last 72 hours.    Invalid input(s): PTT  LFTs: No results for input(s): AST, ALT, TBILI, DBILI, ALKPHOS in the last 72 hours.  Pancreatic Enzymes: No results for  input(s): AMY, LIP in the last 72 hours.  Urinalysis:No results for input(s): UACOL, UACLA, UAGLU, UABIL, UAKET, SPEGRAVURINE, UAOCC, UAPH, UAPRO, UANIT, LEUKOCYTES in the last 72 hours.    Invalid input(s): UAOBU  Troponin:  TROPONIN T HS RANDOM (ng/L)   Date Value   11/30/2021 17 (H)     DELTA 1 HOUR TROPONIN T HS (ng/L)   Date Value   11/24/2021 0     DELTA 3 HOUR TROPONIN T HS (ng/L)   Date Value   11/24/2021 1  MICROBIOLOGY REVIEW  No new microbiology results to review.    IMAGING AND OTHER STUDIES  XR L hip:  Impression:     No visible fracture in this single view. If symptoms persist then a complete hip study is suggested.     CT head w/out contrast:  IMPRESSION:     No CT evidence of acute intracranial abnormality.      XR Knee L:    Impression:     Normal left knee radiographs.         MEDICATIONS  See list in EPIC    ASSESSMENT & PLAN   51 year-old female with history ofhousing instability,bipolar disorder, schizoaffective disorder, substance use disorder,PTSD, multiple suicide attempts,systolic congestive heart failure, seizure disorder on keppra, complex pain syndrome, COPD, bronchial asthma, and obesity, presenting with suicidal ideationadmitted to the inpatient Psychiatry unit for management of suicidal ideation, now resolved. Patient is unable to use walker due to L knee pain and required wheelchair 3/19 at [place]. Wheelchair is unable to fit through doors at [employer]. Admission to medicine for further monitoring and pain control.    #Mechanical fall  #LLE pain  Patient sustained mechanical fall 3/19 while using walker. Pt notes L knee "giving out" which on chart review has occurred in past.Pt notes L knee "hurt" when gave out but no pop/tear anything pt could appreciate. L knee also hurt from contact w/ floor.  Head CT did not show any acute intracranial pathology and L knee XR and L hip XR did not show any fractures.   Neuro exam 3/20 difficult to assess and limited 2/2 pain  but grossly WNL. Mildly swollen though no other joint deformity or concerning features, most likely etiology of pain is swelling from trauma. Difficult to assess ligamentous damage as exam limited. If lack of improvement may consider imaging.   3/20 Pt unable to bear weight when working w/ PT, though improved 3/21 (see PT note). Ortho did not feel further imaging necessary,  -Pain: Tylenol 650 q6hr, naproxen 500 BID, diclofenac gel, lidocaine patch, ice pack (& home suboxone, see below)  -appreciate PT recs    #Complex chronic pain syndrome  #Lumbar spinal stenosis  #Sciatica:  -Buprenorphine-naloxone  -duloxetine  -Gabapentin  -Methocarbamol  -Maintain activity as able  [ ]  Weight reduction strategies as able    #Suicidal ideation  #Schizoaffective disorder, bipolar type  #PTSD  #Anxiety  #Grief reaction  #History of suicide attempts  -Resolved per psych eval, no need for 1:1 or to return to psych once medically optimized.  -Continue current regimen: haldol, duloxetine, loxapine, gabapentin, prazosin, melatonin, and as needed lorazepam, seroquel  [ ]  outpatient psych f/u needed w/ outpatient psychiatrist  [ ]  may benefit from rehab, VNA, housing support    #Substance use disorder  #History of alcohol withdrawal, alcohol use disorder  #h/o housing instability  No current withdrawal  -Acamprosate  -Buprenorphine-naloxone  - unclear history, though per chart review has been noted to use opioids (though pt reports no opioid use other than suboxone for chronic pain); also documentation of cocaine use.  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 Repeat ECHO this  admission WNL EF 70%  Initially, patient was noted to be hypoxemic during previous admission but this resolved with diuresis. Felt to be volume overloaded d/t acute decompensated HF iso decreased diuretic dose (see d/c sum) . D/c'd on 80 torsemide BID, discharge weight ~330s though unclear? If these are bed weights or standing. Notably, BNP was 40s at this time, whereas during exacerbation in 2022 requiring IV diuresis NT proBNP 2000. Weight at discharge 2022 300s.   Current weight 340s but again bed weights difficult to rely on heavily, and pt unable to stand currently. Pt with 2+ pitting edema mid shin b/l though has some baseline LE pitting edema- has baseline orthopnea though no worsening, no other signs of volume overload on exam though exam limited. Challenging weights and volume status, though wonder if pt is chronically overloaded.  Etiology of HF unclear (though possibly alcohol + cocaine use contributory?)  Patient also reports that her "O2 levels decreased at night" and reports that she has sleep apnea but no documented or reported history of CPAP use or sleep study.   3/21 decent UOP though with no improvement in orthopnea, still appears overloaded though exam challenging. May diuresis until Cr bump vs outpatient management  -Budesonide-formoterol  -Fluticasone nasal spray  -Review and consider the addition of a long-acting antimuscarinic agent for COPD maintenance regimen  -3/21Torsemide to 100 mg BID; strict Is/Os (purewick in place); may consider metolazone augmentation pending UOP  -Nicotine replacement therapy and smoking cessation support  -Rescue albuterol as needed  [ ] will consider outpatient sleep study  [ ] Recommend outpatientPulmonology follow    #Hypokalemia, resolved  Potassium3.2in the morning of 3/16. Magnesium was 2. This is in the context of high-dose diuretics, s/p supplementation  -BMP trend    #Dyslipidemia:   LDL 111  -Atorvastatin  -Low-dose aspirin    #Seizure  disorder  -Continue levetiracetam, gabapentin, oxcarbazepine    #Tobacco use disorder:  -Transdermal nicotine as needed  - nicotine gum as needed  [ ]  Consider ongoing pharmacologic treatment and connection with behavioral modification program    #Hepatitis C  -Hep C PCR pending    #Hypothyroidism:History of levothyroxine use.TSH normal on 11/24/2021.  -Levothyroxine    #Atrial fibrillation? (pt self reported?)  #question of stroke?  NSR on admission; History is tricky as most info is from incomplete OSH records, though seems like clinically may have presented with stroke coupe + yrs ago, and has had more than one presentation with concern for stroke, though most recently had CT and MRI that were without acute stroke.  If afib observed on tele may consider doac  -Low-dose aspirin, atorvastatin  -Continue to monitorrhythm    #Gastroesophageal reflux disease:  -Pantoprazole  -Antireflux hygiene    #Bee VenomAllergy: History of anaphylaxis  [ ]   prescription for epinephrine pen and teaching at d/c     Diet: cardiac diet    DVT Prophylaxis: SQ hep    Medication Reconciliation: Done - continued meds from [facility], followed by medicine  Was medication list from nursing facility collected & reviewed? n/a  Changes from home medication regimen: as above.    Code Status: Full Code  Health care proxy: Data Unavailable  Data Unavailable    Dispo: Admission for pain control and monitoring

## 2021-12-07 NOTE — Consults (Signed)
Refer to previous SW notes for further details. SW spoke with group home supervisor Tania this am who is reaching out to [facility] and group home manager to facilitate a meeting with pt and medical team re: housing options. SW to update team once a time is given.

## 2021-12-07 NOTE — Plan of Care (Signed)
Problem: Activity:  Goal: Mobility will be supported throughout the hospitalization,  Outcome: Progressing  Intervention: Assess ambulation throughout the hospitalization, and per protocol  Note: Pt OOB to chair this AM with this RN. Requires 1+ assist.  Using walker. Continues to work with PT. Awaiting consult today.      Problem: Daily Care  Goal: Daily care needs are met  Description: Assess and monitor ability to perform self care and identify potential discharge needs.  Outcome: Progressing  Intervention: Assess ability to perform self care  Note: Requires minimal assist with self-care needs/ADL's.      Problem: Pain  Goal: Patient's pain/discomfort is manageable  Description: Assess and monitor patient's pain using appropriate pain scale. Collaborate with interdisciplinary team and initiate plan and interventions as ordered. Re-assess patient's pain level 30 - 60 minutes after pain management intervention.   Outcome: Progressing  Intervention: Assess patient's pain level  Note: Pt continues to report left knee pain. On standing tylenol. Lidocaine patch applied. Voltaren gel prn. Oxycodone given x 1 w/ good effect. Resting in chair at this time. Will continue to monitor and assess pt's pain level and medicate appropriately.      ~VSS, afebrile. Alert/oriented x 4. Ortho consult pending. Per MD pt medically cleared for D/C. PT recommending STR. Will continue to monitor pt needs.

## 2021-12-07 NOTE — Consults (Signed)
Called Onome Esuturie [phone]), the patient's psych NP     Appointment made for:     Wednesday 12/15/2021 10am   [facility] program)  [address] [place] [address]       She would like the discharge summary (including psych dc summary) to be sent to:   [phone]      Richardean Canal, MD  Psychiatry PGY-2, [phone]

## 2021-12-07 NOTE — Progress Notes (Signed)
Inpatient Physical Therapy Treatment Note  S: "I've been waiting for you."  O: Please see Vital Sign and Pain Assessment flowsheets for vitals and pain documentation.  Plan of care has been reviewed and updated as necessary.     12/07/21 1112   Language Information   Language of Care English   Rehab Discipline   Rehab Discipline PT   Mobility / Balance   Mobility / Balance Yes   Transfers   Transfer Yes   Transfer 1   Transfer From 1 Chair with arms   Transfer Type 1 To and from   Transfer to 1 Stand   Technique 1 Sit to stand;Stand to sit   Transfer Device 1 Rolling walker (Front wheel walker)   Transfer Level of Assistance 1 Contact guard;Minimal verbal cues   Trials/Comments 1 x3   Gait   Gait Yes   Gait 1   Assistive Device 1 Rolling walker (Front wheel walker)   Pattern 1 L Decreased stance time;Decreased stride length   Gait Assistance 1 Contact guard   Distance (Ft) 1 2 Feet   Ther Exercise   Therapeutic Exercise? Yes   Motion AAROM;PROM   Reps 5   Joints Left;Knee   Exercise knee flexion, knee extension   Safety Devices   Type of Devices Call bell in place   Plan   Prognosis Good   PT Frequency 5x/wk   Recommendation   Recommendation Short-term skilled PT   Equipment Recommended Rolling walker (Front wheel walker)     A: Pt received seated in bedside chair, agreeable to treatment. Pt instructed in IEP including AAROM knee flexion/extension; limited by c/o pain. Sit to stand transfers completed with CGA with cues for sequencing. Improved ability to put weight through LLE this date, though still with decreased stance time and limited ROM d/t pain. Progressed to ambulating 2' forward and backwards this date with RW. Left seated in bedside chair with all needs in reach.   P: Continue per PT Care Plan. Recommend STR upon d/c.       Jerimah Witucki C. Doristine Counter, PT, Lic # [id number]

## 2021-12-07 NOTE — Progress Notes (Signed)
SW called [phone] and spoke with group home supervisor Tania who reports that she will reach out to her manager and [facility] services re: scheduling a team meeting with medical team including pt to further discuss options. Pt and team made aware. SW will remain available.

## 2021-12-07 NOTE — Progress Notes (Addendum)
Pt was discussed in MDR. Pt is medically cleared for d/c to a STR. Pt was referred to [facility], [address], [facility], [facility] and [facility] - all declined pt  CM called [facility] - no bed   CM called Mitzie Na - CM will resend clinical, Amy in admissions will review  Pt was referred to [facility], [facility], both [facility]  facilities, [employer] called and [employer] with [employer], Print production planner for [employer], [employer] sent referrals to [facility] and [employer]  Ortho consult ordered for let knee pain

## 2021-12-07 NOTE — Plan of Care (Signed)
Problem: Pain  Goal: Patient's pain/discomfort is manageable  Description: Assess and monitor patient's pain using appropriate pain scale. Collaborate with interdisciplinary team and initiate plan and interventions as ordered. Re-assess patient's pain level 30 - 60 minutes after pain management intervention.   Outcome: Progressing  Note: Patient continues to c/o left knee/leg pain. Standing medications and PRN diclofenac gel applied. Repositioned PRN.    VSS, afebrile. Fall precautions in place, no attempts made to get OOB. Purewick in place, changed PRN.
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
## 2021-12-08 ENCOUNTER — Inpatient Hospital Stay (HOSPITAL_BASED_OUTPATIENT_CLINIC_OR_DEPARTMENT_OTHER): Payer: No Typology Code available for payment source

## 2021-12-08 DIAGNOSIS — F4320 Adjustment disorder, unspecified: Secondary | ICD-10-CM | POA: Diagnosis present

## 2021-12-08 DIAGNOSIS — Z91030 Bee allergy status: Secondary | ICD-10-CM | POA: Diagnosis not present

## 2021-12-08 DIAGNOSIS — R0602 Shortness of breath: Secondary | ICD-10-CM | POA: Diagnosis not present

## 2021-12-08 DIAGNOSIS — E876 Hypokalemia: Secondary | ICD-10-CM | POA: Diagnosis present

## 2021-12-08 DIAGNOSIS — G4733 Obstructive sleep apnea (adult) (pediatric): Secondary | ICD-10-CM | POA: Diagnosis present

## 2021-12-08 DIAGNOSIS — R45851 Suicidal ideations: Secondary | ICD-10-CM | POA: Diagnosis present

## 2021-12-08 DIAGNOSIS — M25562 Pain in left knee: Secondary | ICD-10-CM | POA: Diagnosis present

## 2021-12-08 DIAGNOSIS — F259 Schizoaffective disorder, unspecified: Secondary | ICD-10-CM | POA: Diagnosis not present

## 2021-12-08 DIAGNOSIS — M5430 Sciatica, unspecified side: Secondary | ICD-10-CM | POA: Diagnosis not present

## 2021-12-08 DIAGNOSIS — I480 Paroxysmal atrial fibrillation: Secondary | ICD-10-CM | POA: Diagnosis present

## 2021-12-08 DIAGNOSIS — F1990 Other psychoactive substance use, unspecified, uncomplicated: Secondary | ICD-10-CM | POA: Diagnosis not present

## 2021-12-08 DIAGNOSIS — I5031 Acute diastolic (congestive) heart failure: Secondary | ICD-10-CM | POA: Diagnosis not present

## 2021-12-08 DIAGNOSIS — M25461 Effusion, right knee: Secondary | ICD-10-CM

## 2021-12-08 DIAGNOSIS — G894 Chronic pain syndrome: Secondary | ICD-10-CM | POA: Diagnosis present

## 2021-12-08 DIAGNOSIS — M23262 Derangement of other lateral meniscus due to old tear or injury, left knee: Secondary | ICD-10-CM | POA: Diagnosis present

## 2021-12-08 DIAGNOSIS — F4310 Post-traumatic stress disorder, unspecified: Secondary | ICD-10-CM | POA: Diagnosis present

## 2021-12-08 DIAGNOSIS — I5033 Acute on chronic diastolic (congestive) heart failure: Secondary | ICD-10-CM | POA: Diagnosis present

## 2021-12-08 DIAGNOSIS — E039 Hypothyroidism, unspecified: Secondary | ICD-10-CM | POA: Diagnosis present

## 2021-12-08 DIAGNOSIS — J449 Chronic obstructive pulmonary disease, unspecified: Secondary | ICD-10-CM | POA: Diagnosis present

## 2021-12-08 DIAGNOSIS — S83272A Complex tear of lateral meniscus, current injury, left knee, initial encounter: Secondary | ICD-10-CM

## 2021-12-08 DIAGNOSIS — F251 Schizoaffective disorder, depressive type: Secondary | ICD-10-CM | POA: Diagnosis not present

## 2021-12-08 DIAGNOSIS — E669 Obesity, unspecified: Secondary | ICD-10-CM | POA: Diagnosis not present

## 2021-12-08 DIAGNOSIS — M48061 Spinal stenosis, lumbar 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 claudication: Secondary | ICD-10-CM | POA: Diagnosis present

## 2021-12-08 DIAGNOSIS — F1490 Cocaine use, unspecified, uncomplicated: Secondary | ICD-10-CM | POA: Diagnosis present

## 2021-12-08 DIAGNOSIS — I5030 Unspecified diastolic (congestive) heart failure: Secondary | ICD-10-CM | POA: Diagnosis not present

## 2021-12-08 DIAGNOSIS — E785 Hyperlipidemia, unspecified: Secondary | ICD-10-CM | POA: Diagnosis present

## 2021-12-08 DIAGNOSIS — K219 Gastro-esophageal reflux disease without esophagitis: Secondary | ICD-10-CM | POA: Diagnosis present

## 2021-12-08 DIAGNOSIS — R443 Hallucinations, unspecified: Secondary | ICD-10-CM | POA: Diagnosis not present

## 2021-12-08 DIAGNOSIS — Z720 Tobacco use: Secondary | ICD-10-CM | POA: Diagnosis not present

## 2021-12-08 DIAGNOSIS — Z6841 Body Mass Index (BMI) 40.0 and over, adult: Secondary | ICD-10-CM | POA: Diagnosis not present

## 2021-12-08 DIAGNOSIS — G40909 Epilepsy, unspecified, not intractable, without status epilepticus: Secondary | ICD-10-CM | POA: Diagnosis present

## 2021-12-08 DIAGNOSIS — S83232A Complex tear of medial meniscus, current injury, left knee, initial encounter: Secondary | ICD-10-CM

## 2021-12-08 DIAGNOSIS — E781 Pure hyperglyceridemia: Secondary | ICD-10-CM | POA: Diagnosis present

## 2021-12-08 DIAGNOSIS — M23232 Derangement of other medial meniscus due to old tear or injury, left knee: Secondary | ICD-10-CM | POA: Diagnosis present

## 2021-12-08 DIAGNOSIS — S8390XA Sprain of unspecified site of unspecified knee, initial encounter: Secondary | ICD-10-CM | POA: Diagnosis not present

## 2021-12-08 DIAGNOSIS — S83242A Other tear of medial meniscus, current injury, left knee, initial encounter: Secondary | ICD-10-CM | POA: Diagnosis not present

## 2021-12-08 DIAGNOSIS — W19XXXA Unspecified fall, initial encounter: Secondary | ICD-10-CM | POA: Diagnosis present

## 2021-12-08 DIAGNOSIS — S83262A Peripheral tear of lateral meniscus, current injury, left knee, initial encounter: Secondary | ICD-10-CM | POA: Diagnosis not present

## 2021-12-08 DIAGNOSIS — I5040 Unspecified combined systolic (congestive) and diastolic (congestive) heart failure: Secondary | ICD-10-CM | POA: Diagnosis not present

## 2021-12-08 DIAGNOSIS — F4321 Adjustment disorder with depressed mood: Secondary | ICD-10-CM | POA: Diagnosis not present

## 2021-12-08 DIAGNOSIS — R451 Restlessness and agitation: Secondary | ICD-10-CM | POA: Diagnosis not present

## 2021-12-08 DIAGNOSIS — R600 Localized edema: Secondary | ICD-10-CM | POA: Diagnosis not present

## 2021-12-08 DIAGNOSIS — S83282A Other tear of lateral meniscus, current injury, left knee, initial encounter: Secondary | ICD-10-CM | POA: Diagnosis not present

## 2021-12-08 DIAGNOSIS — F1190 Opioid use, unspecified, uncomplicated: Secondary | ICD-10-CM | POA: Diagnosis present

## 2021-12-08 DIAGNOSIS — F250 Schizoaffective disorder, bipolar type: Secondary | ICD-10-CM | POA: Diagnosis present

## 2021-12-08 DIAGNOSIS — F419 Anxiety disorder, unspecified: Secondary | ICD-10-CM | POA: Diagnosis present

## 2021-12-08 DIAGNOSIS — S838X2A Sprain of other specified parts of left knee, initial encounter: Secondary | ICD-10-CM | POA: Diagnosis present

## 2021-12-08 MED ORDER — DICLOFENAC SODIUM 1 % EX GEL
4.0000 g | Freq: Four times a day (QID) | CUTANEOUS | Status: DC
Start: 2021-12-08 — End: 2021-12-17
  Administered 2021-12-08 – 2021-12-17 (×32): 4 g via TOPICAL
  Filled 2021-12-08 (×2): qty 50

## 2021-12-08 MED ORDER — ACETAMINOPHEN 325 MG PO TABS
975.00 mg | ORAL_TABLET | Freq: Four times a day (QID) | ORAL | Status: AC
Start: 2021-12-08 — End: 2021-12-11
  Administered 2021-12-08 – 2021-12-11 (×11): 975 mg via ORAL
  Filled 2021-12-08 (×11): qty 3

## 2021-12-08 MED ORDER — TORSEMIDE 20 MG PO TABS
120.0000 mg | ORAL_TABLET | Freq: Two times a day (BID) | ORAL | Status: DC
Start: 2021-12-08 — End: 2021-12-09
  Administered 2021-12-08 – 2021-12-09 (×2): 120 mg via ORAL
  Filled 2021-12-08 (×2): qty 1

## 2021-12-08 NOTE — Consults (Addendum)
Orthopedic Inpatient Consultation Note    CC: left knee pain    CONSULTATION: Requested by Dr. Pricilla Riffle    ORTHOPEDIC PROBLEM LIST:  1. Chronic bilateral knee pain  2. Acute left knee pain s/p fall on 12/05/21    HPI: Cheryl Zimmerman is a 51 year old female with a history of schizoaffective disorder, housing instability, bipolar disorder, polysubstance use disorder (on suboxone), PTSD, multiple suicide attempts, history of CHF, seizure disorder on Keppra, complex pain syndrome, COPD, bronchial spasm asthma, and obesity who is referred today for orthopedic evaluation of left knee pain. Pt was initially admitted to the psychiatric unit for management of SI but was transferred to medicine after a fall while inpatient on 12/05/21. Pt states she was walking with her walker (she uses this at baseline) when her left knee suddenly gave out, resulting in her falling directly onto her left knee cap. She then fell and hit her head. She had pain in her knee immediately after. She was using a wheelchair after the fall. Pt was initially unable to ambulate with PT but has been able to improve over the last 2 days. She localizes pain to the entire knee. She feels the tylenol, naproxen, lidocaine patch, and home suboxone are not helping her. She endorses having chronic bilateral knee pain prior to the fall. Since the fall, her left knee pain has been significantly worse. Pt endorses her left knee giving out on her multiple times in the past.     ROS: Reviewed by me and all other systems are negative except as noted in HPI.    Patient Vitals for the past 24 hrs:   BP Temp Temp src Pulse Resp SpO2   12/08/21 0519 113/71 98.3 F (36.8 C) Oral 95 20 97 %   12/07/21 2059 105/71 98.3 F (36.8 C) Oral 91 20 91 %   12/07/21 1326 119/80 97.4 F (36.3 C) Oral 90 20 94 %       LABS:  No results for input(s): NA, K, CL, CO2, BUN, CREAT, GLUCOSER, CA, MG, PHOS, WBC, HGB, HCT, PLTA, AST, ALT, TBILI, ALKPHOS, ESR, CRP in the last 24 hours.    I/O  24 Hrs:  In: 57 [P.O.:840]  Out: 68 [Urine:800]    PMH: Past Medical History:  schizo: Bipolar affective ([place])  No date: Chronic systolic CHF (congestive heart failure) ([name])  No date: Depression  depress: Schizo affective schizophrenia ([place])  No date: Substance abuse ([place])    Surgical HX: No past surgical history on file.    SH:   Social History     Socioeconomic History    Marital status: Divorced     Spouse name: Not on file    Number of children: Not on file    Years of education: Not on file    Highest education level: Not on file   Occupational History    Not on file   Tobacco Use    Smoking status: Every Day     Packs/day: 3.00     Years: 30.00     Pack years: 90.00     Types: Cigarettes    Smokeless tobacco: Never   Substance and Sexual Activity    Alcohol use: Yes    Drug use: Yes     Types: Marijuana    Sexual activity: Yes     Partners: Male   Other Topics Concern    Not on file   Social History Narrative    Not on file  Social Determinants of Health  Financial Resource Strain: Not on file  Food Insecurity: Not on file  Transportation Needs: Not on file  Physical Activity: Not on file  Stress: Not on file  Social Connections: Not on file  Intimate Partner Violence: Not on file  Housing Stability: Not on file    Allergies: Review of Patient's Allergies indicates:   Bee venom               Anaphylaxis   Carbamazepine           Hives   Methylprednisolone      Dizziness, Drowsiness    Comment:Also believes syncope. Seems to tolerate             inhalational and epidural steroid.   Nutritional supplem*       Hydroxyzine             Nausea Only    Medications:    acetaminophen  975 mg Oral Q6H [place]    torsemide  100 mg Oral BID    perflutren protein A microspheres        perflutren  3 mL Intravenous See Admin Instructions    FLUoxetine  20 mg Oral Daily    QUEtiapine  50 mg Oral Nightly    heparin (porcine)  5,000 Units Subcutaneous Q12H SCH    lidocaine  1 patch Topical Q24H     acamprosate  666 mg Oral TID    aspirin  81 mg Oral Daily    atorvastatin  80 mg Oral Nightly    budesonide-formoterol  2 puff Inhalation BID    buprenorphine-naloxone  8 mg Sublingual TID    docusate sodium  100 mg Oral BID    ferrous gluconate  324 mg Oral Once per day on Mon Wed Fri    fluticasone  1 spray Each Nostril BID    gabapentin  600 mg Oral TID    levETIRAcetam  750 mg Oral BID    levothyroxine  25 mcg Oral DAILY    loxapine  25 mg Oral BID    melatonin  3 mg Oral Nightly    methocarbamol  750 mg Oral TID AC    nicotine  1 patch Transdermal Daily    OXcarbazepine  150 mg Oral BID    pantoprazole  40 mg Oral Daily    prazosin  2 mg Oral Nightly    sennosides  17.2 mg Oral Nightly    thiamine  100 mg Oral Daily       IMAGING: X-ray of the left knee from 12/05/21 shows no fractures. Joint spaces are well maintained.    PHYSICAL EXAM:  GENERAL: Alert and oriented, in no apparent distress.  MOOD: Appropriate.   EYES: Pupils are equal, round, reactive to light.    VASCULAR: No obvious edema, rapid capillary refill, 2+ pulses in upper and lower extremities  RESPIRATORY: Regular respiratory rate, no labored breathing or obvious wheezing.   SKIN: Clean, dry and intact. Warm to touch. No obvious abrasions or lacerations.  MUSCULOSKELETAL: Evaluation of the left lower extremity reveals no gross bony deformities. There is a lidocaine patch applied to the anterior knee. ACE bandage wrapped around knee. Skin overlying knee is clean, dry, intact. No erythema, edema, ecchymosis. Pt is TTP over medial joint line, lateral joint line, behind the knee, anteriorly. She is able to perform active and passive knee extension and flexion. Limited ROM: 0-80 degress with pain. LLE is NVI. 2+ DP pulse. Unable to  assess ligaments secondary to patients pain and body habitus.    ASSESSMENT/PLAN: 51 year old female who was on the inpatient psych unit when her left knee gave out and she fell onto her left knee on  12/05/21. Pt was admitted to medicine. Orthopedics was consulted for further evaluation of left knee pain. Pt having significant pain when working with PT. Exam was very limited secondary to pain. X-rays were unremarkable. This was likely a knee sprain vs contusion, however, ligamental injury cannot be ruled out due limited physical exam. This case was discussed with Dr. Council Mechanic. Recommend getting an MRI of the left knee to further assess. Ortho will continue to follow.     #Analgesia- per primary team. Pain not well controlled with naproxen, tylenol, lidocaine patch, and home suboxone.   #Activity- WBAT LLE. Continue working with PT.  #Anticoagulation- Per primary team.   #Dispo- per primary team.    Patient was discussed with orthopedic attending, RN, CM and PT.    Total of 70 minutes was spent with the patient during the encounter explaining imaging findings, diagnosis, prognosis, and treatment recommendations. Risks and benefits of treatment plan discussed. Time was also spent reviewing patient's chart prior to the visit. The patient's questions have been answered, and the patient understands and agrees with treatment plan.     Ann Maki, PA-C, 12/08/2021    Pager [phone]

## 2021-12-08 NOTE — Progress Notes (Signed)
PROGRESS NOTE  12/08/2021    PATIENT INFO: Cheryl Zimmerman, [age] year old female  [place]: 12/05/21    ROOM/BED LOCATION:  [place]: 0    REVIEW OF OVERNIGHT EVENTS:  Pt requesting opioid for pain, given IV toradol    SUBJECTIVE:  Pt notes L knee pain is still present, unchanged from yesterday  Orthopnea vs feeling anxious, pt is not sure between which, but no worse than yesterday    OBJECTIVE:    VITAL SIGNS      12/08/21  1331 12/08/21  1333 12/08/21  2009 12/08/21  2104   BP: 97/63  108/72 105/70   Pulse: 91   82   Resp: 20   20   Temp: 97.7 F (36.5 C)   97.3 F (36.3 C)   TempSrc: Oral   Oral   SpO2: (!) 86% 92%  90%   Weight:       Height:           INS/OUTS (PAST 24 HOURS)  I/O 24 Hrs:  In: 1680 [P.O.:1680]  Out: 2300 [Urine:2300]    PHYSICAL EXAM   GEN:      Uncomfortable, frustrated, no acute distress  CV:      Deferred today  PULM:      Deferred today  ABD:      Deferred today  EXT:      Pitting edema 2+ to mid-shin b/l  MSK:  Mild swelling L knee, moderately TTP most notable at anteromedial joint space,  ROM limited to <45 deg. Due to pain, not erythematous, no fluctuance  SKIN:      No visible rash  NEURO:    Alert and oriented- neuro exam limited by habitus and pain, though grossly normal sensation lower extremities b/l    RECENT LABS  BMP:   Recent Labs     12/06/21  0740 12/07/21  0715   NA 140 143   K 4.1 4.1   CL 99 100   CO2 29 33*   BUN 16 18   CREAT 0.8 0.8   GLUCOSER 106 116     Ca, Mg, Phos:   Recent Labs     12/06/21  0740 12/07/21  0715   CA 9.5 9.8   MG  --  2.0   PHOS  --  4.5     CBC:   No results for input(s): WBC, HCT, HGB, PLTA, RBC in the last 72 hours.  Coagulation Labs: No results for input(s): PT, INR in the last 72 hours.    Invalid input(s): PTT  LFTs: No results for input(s): AST, ALT, TBILI, DBILI, ALKPHOS in the last 72 hours.  Pancreatic Enzymes: No results for input(s): AMY, LIP in the last 72 hours.  Urinalysis:No results for input(s):  UACOL, UACLA, UAGLU, UABIL, UAKET, SPEGRAVURINE, UAOCC, UAPH, UAPRO, UANIT, LEUKOCYTES in the last 72 hours.    Invalid input(s): UAOBU  Troponin:  TROPONIN T HS RANDOM (ng/L)   Date Value   11/30/2021 17 (H)     DELTA 1 HOUR TROPONIN T HS (ng/L)   Date Value   11/24/2021 0     DELTA 3 HOUR TROPONIN T HS (ng/L)   Date Value   11/24/2021 1           MICROBIOLOGY REVIEW  No new microbiology results to review.    IMAGING AND OTHER STUDIES  XR L hip:  Impression:     No visible fracture in this  single view. If symptoms persist then a complete hip study is suggested.     CT head w/out contrast:  IMPRESSION:     No CT evidence of acute intracranial abnormality.      XR Knee L:    Impression:     Normal left knee radiographs.         MEDICATIONS  See list in EPIC    ASSESSMENT & PLAN   51 year-old female with history ofhousing instability,bipolar disorder, schizoaffective disorder, substance use disorder,PTSD, multiple suicide attempts,systolic congestive heart failure, seizure disorder on keppra, complex pain syndrome, COPD, bronchial asthma, and obesity, presenting with suicidal ideationadmitted to the inpatient Psychiatry unit for management of suicidal ideation, now resolved. Patient is unable to use walker due to L knee pain and required wheelchair 3/19 at [facility]. Wheelchair is unable to fit through doors at [employer]. Admission to medicine for further monitoring and pain control.    #Mechanical fall  #LLE pain  #Lateral and medial menisci tear  Patient sustained mechanical fall 3/19 while using walker. Pt notes L knee "giving out" which on chart review has occurred in past.Pt notes L knee "hurt" when gave out but no pop/tear anything pt could appreciate. L knee also hurt from contact w/ floor.  Head CT did not show any acute intracranial pathology and L knee XR and L hip XR did not show any fractures.   Neuro exam 3/20 difficult to assess and limited 2/2 pain but grossly WNL. Mildly swollen though no  other joint deformity or concerning features, most likely etiology of pain is swelling from trauma. Difficult to assess ligamentous damage as exam limited. If lack of improvement may consider imaging.   3/20 Pt unable to bear weight when working w/ PT, though improved 3/21 (see PT note). Ortho did not feel further imaging necessary, however on re-evaluation felt MRI appropriate. MRI showed medial and lateral menisci tear  -Pain: Tylenol increased to 975 q6hr, naproxen 500 BID, diclofenac gel, lidocaine patch, ice pack (& home suboxone and other chronic pain medications, see below)  -appreciate PT recs  -appreciate ortho recommendations 3/23 with respect to menisci tear    #Complex chronic pain syndrome  #Lumbar spinal stenosis  #Sciatica:  -Buprenorphine-naloxone  -duloxetine  -Gabapentin  -Methocarbamol  -Maintain activity as able  -PT, STR, chronic pain clinic follow up    #Suicidal ideation  #Schizoaffective disorder, bipolar type  #PTSD  #Anxiety  #Grief reaction  #History of suicide attempts  -Resolved per psych eval, no need for 1:1 or to return to psych once medically optimized.  -Continue current regimen: haldol, duloxetine, loxapine, gabapentin, prazosin, melatonin, and as needed lorazepam, seroquel  To f/u w/ outpatient psychiatrist  [ ]  may benefit from rehab, VNA, housing support; meeting 3/23 planned    #Substance use disorder  #History of alcohol withdrawal, alcohol use disorder  #h/o housing instability  No current withdrawal  -Acamprosate  -Buprenorphine-naloxone  - unclear history, though per chart review has been noted to use opioids (though pt reports no opioid use other than suboxone for chronic pain); also documentation of cocaine use.  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 Repeat ECHO this  admission WNL EF 70%  Initially, patient was noted to be hypoxemic during previous admission but this resolved with diuresis. Felt to be volume overloaded d/t acute decompensated HF iso decreased diuretic dose (see d/c sum) . D/c'd on 80 torsemide BID, discharge weight ~330s though unclear? If these are bed weights or standing. Notably, BNP was 40s at this time, whereas during exacerbation in 2022 requiring IV diuresis NT proBNP 2000. Weight at discharge 2022 300s.   Current weight 340s but again bed weights difficult to rely on heavily, and pt unable to stand currently. Pt with 2+ pitting edema mid shin b/l though has some baseline LE pitting edema- has baseline orthopnea though no worsening, no other signs of volume overload on exam though exam limited. Challenging weights and volume status, though wonder if pt is chronically overloaded.  Etiology of HF unclear (though possibly alcohol + cocaine use contributory?)  Patient also reports that her "O2 levels decreased at night" and reports that she has sleep apnea but no documented or reported history of CPAP use or sleep study.   3/21 decent UOP though with no improvement in orthopnea, still appears overloaded though exam challenging. May diuresis until Cr bump vs outpatient management  3/23 AM UOP unimpressive and no Cr bump, sx and exam challenging to rely on thus increased torsemide to 120mg  PM  -Budesonide-formoterol  -Fluticasone nasal spray  -Review and consider the addition of a long-acting antimuscarinic agent for COPD maintenance regimen  -3/22 Torsemide to 120 mg BID; strict Is/Os (purewick in place); may consider metolazone augmentation pending UOP  -Nicotine replacement therapy and smoking cessation support  -Rescue albuterol as needed  [ ] will consider  outpatient sleep study  [ ] Recommend outpatientPulmonology follow    #Hypokalemia, resolved  Potassium3.2in the morning of 3/16. Magnesium was 2. This is in the context of high-dose diuretics, s/p supplementation  -BMP trend    #Dyslipidemia:   LDL 111  -Atorvastatin  -Low-dose aspirin    #Seizure disorder  -Continue levetiracetam, gabapentin, oxcarbazepine    #Tobacco use disorder:  -Transdermal nicotine as needed  - nicotine gum as needed  [ ]  Consider ongoing pharmacologic treatment and connection with behavioral modification program    #Hepatitis C  -Hep C PCR pending    #Hypothyroidism:History of levothyroxine use.TSH normal on 11/24/2021.  -Levothyroxine    #Atrial fibrillation? (pt self reported?)  #question of stroke?  NSR on admission; History is tricky as most info is from incomplete OSH records, though seems like clinically may have presented with stroke coupe + yrs ago, and has had more than one presentation with concern for stroke, though most recently had CT and MRI that were without acute stroke.  If afib observed on tele may consider doac  -Low-dose aspirin, atorvastatin  -Continue to monitorrhythm    #Gastroesophageal reflux disease:  -Pantoprazole  -Antireflux hygiene    #Bee VenomAllergy: History of anaphylaxis  [ ]   prescription for epinephrine pen and teaching at d/c     Diet: cardiac diet    DVT Prophylaxis: SQ hep    Medication Reconciliation: Done - continued meds from [place], followed by medicine  Was medication list from nursing facility collected & reviewed? n/a  Changes from home medication regimen: as above.    Code Status: Full Code  Health care proxy: Data Unavailable  Data Unavailable    Dispo: Admission for pain control and monitoring

## 2021-12-08 NOTE — Plan of Care (Signed)
Problem: Activity:  Goal: Mobility will be supported throughout the hospitalization,  Outcome: Progressing  Note: Patient able to get up to commode with +2 assist. Patient very unsteady. Uses call bell appropriately. Bed alarm on at all times.      Problem: Pain  Goal: Patient's pain/discomfort is manageable  Description: Assess and monitor patient's pain using appropriate pain scale. Collaborate with interdisciplinary team and initiate plan and interventions as ordered. Re-assess patient's pain level 30 - 60 minutes after pain management intervention.   Outcome: Progressing  Note: Patient continues to c/o severe left knee pain. x1 IV toradol at start of shift with minimal effect. Standing meds as ordered and diclofenac gel applied.

## 2021-12-08 NOTE — Plan of Care (Addendum)
Problem: Pain  Goal: Patient's pain/discomfort is manageable  Description: Assess and monitor patient's pain using appropriate pain scale. Collaborate with interdisciplinary team and initiate plan and interventions as ordered. Re-assess patient's pain level 30 - 60 minutes after pain management intervention.   Outcome: Not Progressing  Note: Pain to left knee continues to be an issue, patient reports it is "not better." Standing meds and PRN naproxen as ordered. Diclofenac gel applied.   Edema noted to LLE. Encouraged elevation.  o2 sat at bedtime 88-90% on RA. Placed on 2L via nasal cannula per patient request for short period of time, MD aware.   Fall precautions in place. Up to commode with assist.

## 2021-12-08 NOTE — Consults (Signed)
Pt resting comfortable in the room this afternoon. When awoken she does complain of knee pain. She continues to work with PT to build strength. She continues to deny SI intent or plans or intrusive thoughts. No need to return to Inpatient psychiatry at this time.

## 2021-12-08 NOTE — Progress Notes (Signed)
S: I can't find my wallet.  Support given.   O: Please see Vital Sign and Pain Assessment flowsheets for vitals and pain documentation.  Plan of care has been reviewed.   12/08/21 1101   Language Information   Language of Care English   Rehab Discipline   Rehab Discipline PT   Weight Bearing Status   RLE FWB   LLE FWB  (pain)   RUE FWB   LUE FWB   Mobility / Balance   Mobility / Balance Yes   Bed Mobility   Supine to Sit Min assist to left 1  (L LE ASSIST)   Transfers   Transfer Yes   Transfer 1   Transfer From 1 Bed   Transfer Type 1 To and from   Transfer to 1 Stand   Technique 1 Sit to stand;Stand to sit   Transfer Device 1 Rolling walker (Front wheel walker)   Transfer Level of Assistance 1 Contact guard   Transfers 2   Transfer From 2 Stand   Transfer Type 2 To and from   Transfer to 2 Standard bedside commode   Technique 2 Squat pivot;To right;To left   Transfer Device 2 Rolling walker (Front wheel walker)   Transfer Level of Assistance 2 Contact guard   Gait   Gait Yes   Gait 1   Assistive Device 1 Rolling walker (Front wheel walker)   Pattern 1 L Decreased stance time;Decreased stride length   Gait Assistance 1 Contact guard;Min verbal cues   Distance (Ft) 1 2 Feet   Balance   Sitting - Static Sits without support for > 30 sec;Feet supported   Sitting - Dynamic Moves/returns truncal midpoint 1-2 inches in multiple planes;Feet supported   Standing - Static Bilateral upper extremity supported   Standing - Dynamic Lateral lean;Forward lean   Posture Rounded shoulders;Forward head;Kyphosis   Ther Exercise   Therapeutic Exercise? No   Safety Devices   Type of Devices Call bell in place;Bed alarm in place   Plan   Prognosis Good   PT Frequency 5x/wk   Recommendation   Recommendation Short-term skilled PT   Equipment Recommended Rolling walker (Front wheel walker)       A:  Pt in bed on arrival. Required assist to move Left LE to EOB.  Ace wrapped L knee for support.  Transfers with rolling walker with CG. Only  able to take a few steps. Unable to flex and advance LLE . C/O  knee pain. RN notified. Pt perseverating  on not being able to find wallet. Staff aware of situation.  Seated EOB / Alarm on. All needs in reach.   P: Continue per PT Care Plan.   Harlin Rain, PTA, Lic # [id number]

## 2021-12-08 NOTE — Plan of Care (Signed)
Problem: Activity:  Goal: Mobility will be supported throughout the hospitalization,  Outcome: Progressing  Intervention: Assess ambulation throughout the hospitalization, and per protocol  Note: Pt OOB to chair this AM with this RN. Requires 1+ assist.  Using walker. Continues to work with PT. Able to take a few steps then back to chair.      Problem: Daily Care  Goal: Daily care needs are met  Description: Assess and monitor ability to perform self care and identify potential discharge needs.  Outcome: Progressing  Intervention: Assess ability to perform self care  Note: Requires minimal assist with self-care needs/ADL's.      Problem: Pain  Goal: Patient's pain/discomfort is manageable  Description: Assess and monitor patient's pain using appropriate pain scale. Collaborate with interdisciplinary team and initiate plan and interventions as ordered. Re-assess patient's pain level 30 - 60 minutes after pain management intervention.   Outcome: Progressing  Intervention: Assess patient's pain level  Note: Pt continues to report left knee pain. On standing tylenol. Lidocaine patch applied. Voltaren gel prn. Naproxen prn. MD is avoiding narcotics. Pt made aware. Resting in chair at this time. Will continue to monitor and assess pt's pain level and medicate appropriately.      ~VSS, afebrile. Alert/oriented x 4. Refused AM labs. Per MD pt medically cleared for D/C. PT recommending STR. Bed search continues. Will continue to monitor pt needs.

## 2021-12-08 NOTE — Progress Notes (Signed)
SW received call from Homer (Dance movement psychotherapist) who reports that outpatient team will be available on 3/23 at 11 am for a meeting. Team made aware. SW will continue to follow.

## 2021-12-09 DIAGNOSIS — S83282A Other tear of lateral meniscus, current injury, left knee, initial encounter: Secondary | ICD-10-CM

## 2021-12-09 DIAGNOSIS — S83242A Other tear of medial meniscus, current injury, left knee, initial encounter: Secondary | ICD-10-CM

## 2021-12-09 LAB — CBC WITH PLATELET
ABSOLUTE NRBC COUNT: 0 10*3/uL (ref 0.0–0.0)
HEMATOCRIT: 46.7 % — ABNORMAL HIGH (ref 34.1–44.9)
HEMOGLOBIN: 14.1 g/dL (ref 11.2–15.7)
MEAN CORP HGB CONC: 30.2 g/dL — ABNORMAL LOW (ref 31.0–37.0)
MEAN CORPUSCULAR HGB: 30.2 pg (ref 26.0–34.0)
MEAN CORPUSCULAR VOL: 100 fl (ref 80.0–100.0)
MEAN PLATELET VOLUME: 11.2 fL (ref 8.7–12.5)
NRBC %: 0 % (ref 0.0–0.0)
PLATELET COUNT: 300 10*3/uL (ref 150–400)
RBC DISTRIBUTION WIDTH STD DEV: 56.5 fL — ABNORMAL HIGH (ref 35.1–46.3)
RED BLOOD CELL COUNT: 4.67 M/uL (ref 3.90–5.20)
WHITE BLOOD CELL COUNT: 7.8 10*3/uL (ref 4.0–11.0)

## 2021-12-09 LAB — BASIC METABOLIC PANEL
ANION GAP: 10 mmol/L (ref 10–22)
BUN (UREA NITROGEN): 26 mg/dL — ABNORMAL HIGH (ref 7–18)
CALCIUM: 9.8 mg/dL (ref 8.5–10.5)
CARBON DIOXIDE: 30 mmol/L (ref 21–32)
CHLORIDE: 102 mmol/L (ref 98–107)
CREATININE: 1 mg/dL (ref 0.4–1.2)
ESTIMATED GLOMERULAR FILT RATE: >60 mL/min (ref >60)
Glucose Random: 130 mg/dL (ref 74–160)
POTASSIUM: 4.2 mmol/L (ref 3.5–5.1)
SODIUM: 143 mmol/L (ref 136–145)

## 2021-12-09 LAB — PHOSPHORUS MAGNESIUM
MAGNESIUM: 2.1 mg/dL (ref 1.6–2.6)
PHOSPHORUS: 4 mg/dL (ref 2.5–4.9)

## 2021-12-09 MED ORDER — PRAZOSIN HCL 1 MG PO CAPS
3.0000 mg | ORAL_CAPSULE | Freq: Every evening | ORAL | Status: DC
Start: 2021-12-09 — End: 2021-12-17
  Administered 2021-12-09 – 2021-12-16 (×8): 3 mg via ORAL
  Filled 2021-12-09 (×8): qty 3

## 2021-12-09 MED ORDER — KETOROLAC TROMETHAMINE 15 MG/ML IJ SOLN
15.00 mg | Freq: Once | INTRAMUSCULAR | Status: AC
Start: 2021-12-09 — End: 2021-12-09
  Administered 2021-12-09: 15 mg via INTRAVENOUS
  Filled 2021-12-09: qty 1

## 2021-12-09 MED ORDER — DIAZEPAM 5 MG/ML IJ SOLN
2.50 mg | Freq: Once | INTRAMUSCULAR | Status: AC
Start: 2021-12-09 — End: 2021-12-09
  Administered 2021-12-09: 2.5 mg via INTRAVENOUS
  Filled 2021-12-09: qty 2

## 2021-12-09 MED ORDER — BUPRENORPHINE HCL-NALOXONE HCL 8-2 MG SL SUBL
8.0000 mg | SUBLINGUAL_TABLET | Freq: Every day | SUBLINGUAL | Status: DC | PRN
Start: 2021-12-09 — End: 2021-12-10
  Administered 2021-12-09 – 2021-12-10 (×2): 1 via SUBLINGUAL
  Filled 2021-12-09: qty 1

## 2021-12-09 MED ORDER — TORSEMIDE 100 MG PO TABS
100.0000 mg | ORAL_TABLET | Freq: Two times a day (BID) | ORAL | Status: DC
Start: 2021-12-09 — End: 2021-12-17
  Administered 2021-12-09 – 2021-12-17 (×16): 100 mg via ORAL
  Filled 2021-12-09 (×17): qty 1

## 2021-12-09 NOTE — Progress Notes (Signed)
PROGRESS NOTE  12/09/2021    PATIENT INFO: Cheryl Zimmerman, [age] year old female  DATE OF ADMISSION: 12/05/21    ROOM/BED LOCATION:  610/610-A  HOSPITAL DAY: 1    REVIEW OF OVERNIGHT EVENTS:  No events    SUBJECTIVE:  Pt notes L knee pain is still present, possibly improved from yesterday  Orthopnea vs feeling anxious, pt is not sure between which, but no worse than yesterday    OBJECTIVE:    VITAL SIGNS      12/09/21  0740 12/09/21  0830 12/09/21  1317 12/09/21  1342   BP: 131/85   141/75   Pulse:    81   Resp:  Temp:    97.8 F (36.6 C)   TempSrc:    Oral   SpO2:    91%   Weight:       Height:           INS/OUTS (PAST 24 HOURS)  I/O 24 Hrs:  In: 960 [P.O.:960]  Out: 1750 [Urine:1750]    PHYSICAL EXAM   GEN:      Uncomfortable, frustrated, no acute distress  CV:      unremarkable  PULM:      Diminished breath sounds  ABD:      NTND, soft  EXT:      Pitting edema 2+ to mid-shin b/l  MSK:  Mild swelling L knee, moderately TTP most notable at anteromedial joint space,  ROM limited to <45 deg. Due to pain, not erythematous, no fluctuance  SKIN:      No visible rash  NEURO:    Alert and oriented- neuro exam limited by habitus and pain, though grossly normal sensation lower extremities b/l    RECENT LABS  BMP:   Recent Labs     12/07/21  0715 12/09/21  0730   NA 143 143   K 4.1 4.2   CL 100 102   CO2 33* 30   BUN 18 26*   CREAT 0.8 1.0   GLUCOSER 116 130     Ca, Mg, Phos:   Recent Labs     12/07/21  0715 12/09/21  0730   CA 9.8 9.8   MG 2.0 2.1   PHOS 4.5 4.0     CBC:   Recent Labs     12/09/21  0730   WBC 7.8   HCT 46.7*   HGB 14.1   PLTA 300   RBC 4.67     Coagulation Labs: No results for input(s): PT, INR in the last 72 hours.    Invalid input(s): PTT  LFTs: No results for input(s): AST, ALT, TBILI, DBILI, ALKPHOS in the last 72 hours.  Pancreatic Enzymes: No results for input(s): AMY, LIP in the last 72 hours.  Urinalysis:No results for input(s): UACOL, UACLA, UAGLU, UABIL, UAKET,  SPEGRAVURINE, UAOCC, UAPH, UAPRO, UANIT, LEUKOCYTES in the last 72 hours.    Invalid input(s): UAOBU  Troponin:  TROPONIN T HS RANDOM (ng/L)   Date Value   11/30/2021 17 (H)     DELTA 1 HOUR TROPONIN T HS (ng/L)   Date Value   11/24/2021 0     DELTA 3 HOUR TROPONIN T HS (ng/L)   Date Value   11/24/2021 1           MICROBIOLOGY REVIEW  No new microbiology results to review.    IMAGING AND OTHER STUDIES  XR L hip:  Impression:  No visible fracture in this single view. If symptoms persist then a complete hip study is suggested.     CT head w/out contrast:  IMPRESSION:     No CT evidence of acute intracranial abnormality.      XR Knee L:    Impression:     Normal left knee radiographs.         MEDICATIONS  See list in EPIC    ASSESSMENT & PLAN   51 year-old female with history ofhousing instability,bipolar disorder, schizoaffective disorder, substance use disorder,PTSD, multiple suicide attempts,systolic congestive heart failure, seizure disorder on keppra, complex pain syndrome, COPD, bronchial asthma, and obesity, presenting with suicidal ideationadmitted to the inpatient Psychiatry unit for management of suicidal ideation, now resolved. Patient is unable to use walker due to L knee pain and required wheelchair 3/19 at [facility]. Wheelchair is unable to fit [facility]. Admission to medicine for further monitoring and pain control.    #Mechanical fall  #LLE pain  #Lateral and medial menisci tear  Patient sustained mechanical fall 3/19 while using walker. Pt notes L knee "giving out" which on chart review has occurred in past.Pt notes L knee "hurt" when gave out but no pop/tear anything pt could appreciate. L knee also hurt from contact w/ floor.  Head CT did not show any acute intracranial pathology and L knee XR and L hip XR did not show any fractures.   Neuro exam 3/20 difficult to assess and limited 2/2 pain but grossly WNL. Mildly swollen though no other joint deformity or concerning  features, most likely etiology of pain is swelling from trauma. Difficult to assess ligamentous damage as exam limited. If lack of improvement may consider imaging.   3/20 Pt unable to bear weight when working w/ PT, though improved 3/21 (see PT note). Ortho did not feel further imaging necessary, however on re-evaluation felt MRI appropriate. MRI showed medial and lateral menisci tear  -Pain: Tylenol increased to 975 q6hr, naproxen 500 BID, diclofenac gel, lidocaine patch, ice pack (& home suboxone and other chronic pain medications, see below); *added buprenorphine 4 BID PRN for breakthrough knee pain  -appreciate PT recs  -appreciate ortho recommendations; currently will f/u outpatient 3/28, ace wrap currently- no brace for leg size while admitted, STR may be able to order vs may be able to obtain in clinic 3/28    #Complex chronic pain syndrome  #Lumbar spinal stenosis  #Sciatica:  -Buprenorphine-naloxone 8-2TID  -Fluoxetine  -Gabapentin  -Methocarbamol  -Maintain activity as able  -PT, STR, chronic pain clinic follow up    #Suicidal ideation  #Schizoaffective disorder, bipolar type  #PTSD  #Anxiety  #Grief reaction  #History of suicide attempts  -Resolved per psych eval, no need for 1:1 or to return to psych once medically optimized.  -Continue current regimen: haldol, fluoxetine, loxapine, gabapentin, prazosin (increased from 2->3mg  nightly  3/23 per psych), melatonin, and as needed lorazepam, seroquel  To f/u w/ outpatient psychiatrist  [ ]  may benefit from rehab, VNA, housing support  *meeting 3/23, SW indicating pt to go to respite bed post STR- [facility] and [facility] will continue to provide support (see SW note 3/23)    #Substance use disorder  #History of alcohol withdrawal, alcohol use disorder  #h/o housing instability  No current withdrawal  -Acamprosate  -Buprenorphine-naloxone  - unclear history, though per chart review has been noted to use opioids (though pt reports no opioid use other than  suboxone for chronic pain); also documentation  of cocaine use.  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 Repeat ECHO this admission WNL EF 70%  Initially, patient was noted to be hypoxemic during previous admission but this resolved with diuresis. Felt to be volume overloaded d/t acute decompensated HF iso decreased diuretic dose (see d/c sum) . D/c'd on 80 torsemide BID, discharge weight ~330s though unclear? If these are bed weights or standing. Notably, BNP was 40s at this time, whereas during exacerbation in 2022 requiring IV diuresis NT proBNP 2000. Weight at discharge 2022 300s.   Current weight 340s but again bed weights difficult to rely on heavily, and pt unable to stand currently. Pt with 2+ pitting edema mid shin b/l though has some baseline LE pitting edema- has baseline orthopnea though no worsening, no other signs of volume overload on exam though exam limited. Challenging weights and volume status, though wonder if pt is chronically overloaded.  Etiology of HF unclear (though possibly alcohol + cocaine use contributory?)  Patient also reports that her "O2 levels decreased at night" and reports that she has sleep apnea but no documented or reported history of CPAP use or sleep study.   3/21 decent UOP though with no improvement in orthopnea, still appears overloaded though exam challenging. May diuresis until Cr bump vs outpatient management  3/22 AM UOP unimpressive and no Cr bump, sx and exam challenging to rely on thus increased torsemide to 120mg  PM  3/23 BUN and Cr bump on 120mg  BID- thus returned to 100mg  BID as maintenance dose. Volume status and  weight unable to perform reliably due to habitus, knee injury.  -Budesonide-formoterol  -Fluticasone nasal spray  -Review and consider the addition of a long-acting antimuscarinic agent for COPD maintenance regimen as TI, though pt may need PFTs and sleep study first  -3/22 Torsemide to 120 mg BID; strict Is/Os (purewick in place); may consider metolazone augmentation pending UOP  -Nicotine replacement therapy and smoking cessation support  -Rescue albuterol as needed  [ ] will consider outpatient sleep study  [ ] Recommend outpatientPulmonology follow up/PFTs    #Hypokalemia, resolved  Potassium3.2in the morning of 3/16. Magnesium was 2. This is in the context of high-dose diuretics, s/p supplementation  -BMP trend    #Dyslipidemia:   LDL 111  -Atorvastatin  -Low-dose aspirin    #Seizure disorder  -Continue levetiracetam, gabapentin, oxcarbazepine    #Tobacco use disorder:  -Transdermal nicotine as needed  - nicotine gum as needed  [ ]  Consider ongoing pharmacologic treatment and connection with behavioral modification program    #Hepatitis C  -Hep C PCR pending    #Hypothyroidism:History of levothyroxine use.TSH normal on 11/24/2021.  -Levothyroxine    #Atrial fibrillation? (pt self reported?)  #question of stroke?  NSR on admission; History is tricky as most info is from incomplete OSH records, though seems like clinically may have presented with stroke coupe + yrs ago, and has had more than one presentation with concern for stroke, though most recently had CT and MRI that were without acute stroke.  If afib observed on tele may consider doac  -Low-dose aspirin, atorvastatin  -Continue to monitorrhythm    #Gastroesophageal reflux disease:  -Pantoprazole  -Antireflux hygiene    #  Bee VenomAllergy: History of anaphylaxis  [ ]   prescription for epinephrine pen and teaching at d/c     Diet: cardiac diet    DVT Prophylaxis: SQ hep    Medication Reconciliation: Done - continued meds from  [facility], followed by medicine  Was medication list from nursing facility collected & reviewed? n/a  Changes from home medication regimen: as above.    Code Status: Full Code  Health care proxy: Data Unavailable  Data Unavailable    Dispo: Admission for pain control and monitoring

## 2021-12-09 NOTE — Progress Notes (Addendum)
Pt was discussed in MDR. Pt is medically cleared for d/c today. PT recommends STR. CM has called [facility], admissions reviewing, beds are tight. CM called [facility], admissions could not accept messages., CM called [facility] liaison, Ladona Ridgel, LM and texted her, awaiting a call back. CM called [facility], spoke with Baird Lyons, they will review pt.    There was a team meeting today at 87 AM with Dr. Enid Skeens, SW, RN CM, [facility] group home manager and Darl Pikes from [employer] services.   Pt states that she does not feel comfortable in a group home setting and she does not want to return to [facility], wants to get her own place with the support of [place] services. Per Ena Dawley and Darl Pikes, they will close Group home bed, pt will continue to receive wrap around services through [facility]. Per Ena Dawley and Darl Pikes, a respite bed will be available to pt after STR and that [facility] and [facility] will continue to provide ongoing support.   Pt states that she is aware that there may be no choice as to where she will go to STR.

## 2021-12-09 NOTE — Plan of Care (Signed)
Client demanding of care, but much more participative, making healthier decisions, like up to commode without weight on knee several times, now trialing off purewick, doing deep breathing, asking for ativan but accepts she is nodding off and defers dose "unitl later", and now accepting rehab plan, with hopes ot get to new "home" thereafter.

## 2021-12-09 NOTE — Progress Notes (Signed)
S: I have knee pain, but I want to do it.  10/10 RN notified,   O: Please see Vital Sign and Pain Assessment flowsheets for vitals and pain documentation.  Plan of care has been reviewed.   12/09/21 1027   Language Information   Language of Care English   Rehab Discipline   Rehab Discipline PT   Weight Bearing Status   RLE FWB   LLE FWB   RUE FWB   LUE FWB   Mobility / Balance   Mobility / Balance Yes   Transfers   Transfer Yes   Transfer 1   Transfer From 1 Chair with arms   Transfer Type 1 To   Transfer to 1 Stand   Technique 1 Sit to stand   Transfer Device 1 Rolling walker (Front wheel walker)   Transfer Level of Assistance 1 Contact guard   Trials/Comments 1 1   Transfers 2   Transfer From 2 Stand   Transfer Type 2 To and from   Transfer to 2 Extra wide bedside commode   Technique 2 Sit to stand;Stand to sit   Transfer Device 2 Rolling walker (Front wheel walker)   Transfer Level of Assistance 2 Contact guard   Gait   Gait Yes   Gait 1   Assistive Device 1 Rolling walker (Front wheel walker)   Pattern 1 L Decreased stance time;Decreased stride length   Gait Assistance 1 Contact guard;Min verbal cues   Distance (Ft) 1 12 Feet   Balance   Sitting - Static Sits without support for > 30 sec;Feet supported   Sitting - Dynamic Moves/returns truncal midpoint 1-2 inches in multiple planes;Feet supported   Standing - Static Bilateral upper extremity supported   Standing - Dynamic Lateral lean;Forward lean   Posture Rounded shoulders;Forward head;Kyphosis   Ther Exercise   Therapeutic Exercise? No   Safety Devices   Type of Devices Call bell in place   Plan   Prognosis Good   PT Frequency 5x/wk   Recommendation   Recommendation Short-term skilled PT   Equipment Recommended Rolling walker (Front wheel walker)       A:  Pt in chair on arrival.  Ace bandage donned.  Improving today with transfers & mobility. Sit<> stand with CG. Using rolling walker increased ambulation , 12' x1 , limited by  L knee Pain.  Pt is motivated  in progressing with ambulation. Would benefit from cont rehab to return to independent level of function.  Seated EOB with all needs in reach.   P: Continue per PT Care Plan.   Monia Pouch, PTA, Lic # [id number]

## 2021-12-09 NOTE — Plan of Care (Addendum)
A&Ox3. Denies SOB/CP/dizziness. VSS, afebrile. Lung sounds dim. Purewick removed. Up to chair and commode throughout day. Up to make needs known, call light placed within reach. Will continue to monitor.  Problem: Activity:  Goal: Mobility will be supported throughout the hospitalization,  Outcome: Progressing     Problem: Cognitive:  Goal: Caregivers and patients knowledge of risk factors and measures for prevention of condition will be supported throughout the hospitalization  Outcome: Progressing     Problem: Daily Care  Goal: Daily care needs are met  Description: Assess and monitor ability to perform self care and identify potential discharge needs.  Outcome: Progressing     Problem: Pain  Goal: Patient's pain/discomfort is manageable  Description: Assess and monitor patient's pain using appropriate pain scale. Collaborate with interdisciplinary team and initiate plan and interventions as ordered. Re-assess patient's pain level 30 - 60 minutes after pain management intervention.   Outcome: Progressing     Problem: Compromised Skin Integrity  Description: Use this problem when pressure ulcers are classified as stage I or II.  Goal: Skin integrity is maintained or improved  Description: Assess and monitor skin integrity. Identify patients at risk for skin breakdown on admission and per policy. Collaborate with interdisciplinary team and initiate plans and interventions as needed.  Outcome: Progressing     Problem: Knowledge Deficit  Goal: Patient/S.O. demonstrates understanding of disease process, treatment plan, medications, and discharge instructions.  Description: Complete learning assessment and assess knowledge base  Outcome: Progressing

## 2021-12-09 NOTE — Plan of Care (Signed)
Feeling better, shows safe transfer with walker to chair and commode, considering rehab with [facility], CM, group home, and reviewing elopement from their and options. Client concerned about wallet and located in safe at registration.

## 2021-12-09 NOTE — Plan of Care (Signed)
Client had good day, doing well with transfers, urinating in commode. Threatened suing hosptial and going AMA as took time to get wallet, so took ativan. Laughing and apologetic later, and belongings now sorted out,and has wallet and few clothes. Still one valuables envelope with meds in safe.

## 2021-12-09 NOTE — Progress Notes (Signed)
SOCIAL WORK - REASSESSMENT       Case Reviewed with: Patient and Provider    Current Level Of Care: Medically clear    Insurance Concerns: N/A    Initial Child psychotherapist Request: SUD      Change In Status From Initial Assessment: N/A    New Information That Will Affect Transition Plan:     Team meeting held today at 83 with pt. In the meeting was Dr. Enid Skeens, writer, RN CM, [facility] group home Production designer, theatre/television/film and Darl Pikes from [employer] services. MD provided medical updates. RN CM provided updates re: referral to STR facilities (no bed offer at this time, discussed barriers, etc..).     Pt explains that she does not feel comfortable in a group home setting and she does not want to return to [facility], wants to get her own place with the support of [place] services. Per Ena Dawley and Darl Pikes they will be closing the intensive medical bed; however, pt will continue to receive wrap around services through [facility]. Per Ena Dawley and Darl Pikes respite bed will be available to pt after STR and that [facility] and [facility] will continue to provide ongoing support. SW will remain available.     Anticipated D/C Plan: D/C to STR pending bed offer.     Per Patient Choice, Referrals Made To: Refer to RN CM note for further details    Barriers To Plan: STR bed offer    Patient/Responsible Party Understanding of Final Plan: Yes, Able to repeat back plan with understanding

## 2021-12-09 NOTE — Consults (Signed)
Psychiatry CL Service Follow-Up Note    Prior psychiatry consult notes as well as interim history reviewed in EPIC.   Interval Events: working with PT (recommending STR on dispo), systems meeting held by medical team SW along with attending, case manager, and representatives from group home and [facility]     On interview, patient is cooperative and pleasant. States feeling OK. Does report being in pain on L side, and has some frustration with pain control. Was also informed by the surgery team that she will need surgery for torn ligaments and is distressed by this. Has been feeling overwhelmed to the point of some passive thoughts of "wanting it to be over," but denies plans or intent, denies HI, denies AVH. Continuing to have nightmares at night. Finds talking about her problems very helpful. Wants to be able to get to rehab so she can work on her strength.      BP 131/85    Pulse 90    Temp 97.9 F (36.6 C) (Oral)    Resp 18    Ht 5\' 9"  (1.753 m)    Wt (!) 158 kg (348 lb 4.8 oz)    SpO2 94%    BMI 51.43 kg/m     New Relevant Labs/Studies:   BMP:   Recent Labs     12/07/21  0715 12/09/21  0730   NA 143 143   K 4.1 4.2   CL 100 102   CO2 33* 30   BUN 18 26*   CREAT 0.8 1.0   GLUCOSER 116 130     Ca, Mg, Phos:   Recent Labs     12/07/21  0715 12/09/21  0730   CA 9.8 9.8   MG 2.0 2.1   PHOS 4.5 4.0     CBC:   Recent Labs     12/09/21  0730   WBC 7.8   HGB 14.1   HCT 46.7*   PLTA 300   RBC 4.67     Coagulation Labs: No results for input(s): PT, APTT, INR in the last 72 hours.  LFTs: No results for input(s): AST, ALT, TBILI, DBILI, ALKPHOS, ALBUMIN in the last 72 hours.  Pancreatic Enzymes: No results for input(s): AMY, LIP in the last 72 hours.  Urinalysis:No results for input(s): UACOL, UACLA, UAGLU, UABIL, UAKET, SPEGRAVURINE, UAOCC, UAPH, UAPRO, UANIT, LEUKOCYTES in the last 72 hours.    Invalid input(s): UAOBU  Troponin: No results for input(s): TROPI in the last 72 hours.  NT-proBNP:   No results for input(s):  PROBNP in the last 72 hours.  Lactic acid: No results for input(s): LACTICACID in the last 72 hours.    Mental Status Exam:  Mental Status Exam   General Appearance: Clean;Dressed in hospital gown/johnny  Behavior: Cooperative;Good eye contact  Level of Consciousness: Alert  Orientation Level: Grossly Intact  Attention/Concentration: WNL  Mannerisms/Movements: No abnormal mannerisms/movements  Speech Quality and Rate: WNL  Speech Clarity: Clear  Speech Tone: Normal vocal inflection  Vocabulary/Fund of Knowledge: WNL  Memory: Grossly intact  Thought Process & Associations: Goal-directed;Logical;Organized;Linear  Dissociative Symptoms: None  Thought Content: No abnormalities reported or observed  Delusions: None  Hallucinations: None  Suicidal Thoughts: Passive thoughts;Reasons for not doing it;Future oriented  Homicidal Thoughts: None  Mood: Euthymic;Content  * Mood comment: "in pain"  Affect: Congruent with mood  Judgment: Fair  Insight: Fair    Medication:  Scheduled medications:    torsemide  100 mg Oral BID    acetaminophen  975 mg Oral Q6H SCH    diclofenac  4 g Topical 4x Daily    FLUoxetine  20 mg Oral Daily    QUEtiapine  50 mg Oral Nightly    heparin (porcine)  5,000 Units Subcutaneous Q12H [facility]    lidocaine  1 patch Topical Q24H    acamprosate  666 mg Oral TID    aspirin  81 mg Oral Daily    atorvastatin  80 mg Oral Nightly    budesonide-formoterol  2 puff Inhalation BID    buprenorphine-naloxone  8 mg Sublingual TID    docusate sodium  100 mg Oral BID    ferrous gluconate  324 mg Oral Once per day on Mon Wed Fri    fluticasone  1 spray Each Nostril BID    gabapentin  600 mg Oral TID    levETIRAcetam  750 mg Oral BID    levothyroxine  25 mcg Oral DAILY    loxapine  25 mg Oral BID    melatonin  3 mg Oral Nightly    methocarbamol  750 mg Oral TID AC    nicotine  1 patch Transdermal Daily    OXcarbazepine  150 mg Oral BID    pantoprazole  40 mg Oral Daily    prazosin  2 mg Oral Nightly     sennosides  17.2 mg Oral Nightly    thiamine  100 mg Oral Daily       As needed medications:   Current Facility-Administered Medications   Medication Dose Route Frequency Last Admin    buprenorphine-naloxone  8 mg Sublingual Daily PRN      LORazepam  1 mg Oral Q4H PRN 1 mg at 12/09/21 0419    naproxen  500 mg Oral Q12H PRN 500 mg at 12/09/21 1011    albuterol HFA  2 puff Inhalation Q4H PRN      aluminum-magnesium hydroxide-simethicone  30 mL Oral Q2H PRN      eucerin   Topical Q12H PRN      magnesium hydroxide  30 mL Oral Daily PRN      nicotine polacrilex  2 mg Oral Q1H PRN 2 mg at 12/09/21 0847    nystatin   Topical BID PRN Given at 12/07/21 2002        Assessment/Impressions:   87F, English-speaking, charted diagnoses of PTSD, SCAD, BPAD, PSUD (mainly alcohol), multiple prior psych hospitalizations, multiple prior suicide attempts and SIB requiring medical attention, hx head trauma with seizure disorder, med hx COPD and CHF, has [facility] services who returns to medical floor from psych admission s/p mechanical fall with head strike now working with PT for mobility and strength, with psych consulted for SI precautions.     The patient is now denying thoughts and plans of SI, reports that her disturbing hallucinations from last week have improved. She has been in behavioral control while admitted to psych as well as the days before that admission. She is at chronic increased risk of harm to self due to history of multiple attempts (including one this month), lack of social supports. However, this is mitigated by her help-seeking behavior and cooperation with care. She has also not exhibited any self-injurious behaviors for over a week now, as well as being able to safety plan, including willingness to remain in the hospital for care and being agreeable with discharge to STR. At the moment she does not meet Section 12 criteria for an involuntary hold.     3/23: normative stress response  to pain, current  health issues. Still complains of nightmares. Per chart review SBP has generally been >100, reasonable to increase prazosin.       DSM 5 Diagnoses:  Primary Psychiatric Diagnosis: PTSD  Secondary Psychiatric Diagnosis:  Depressive disorder   Psychosocial Stressors: Housing instability, past trauma of son dying from [place] in front of her    Recommendations - new additions in bold:   - continue with psych meds as indicated on d/c summary from last inpatient psychiatry admission   - please increase prazosin from 2mg  qhs to 3mg  qhs with holding parameters for sbp<90  - no need for safety watch; patient does not meet S12 criteria  - we are working to schedule appointment with her outpatient psychiatrist  - dispo to STR is reasonable   - we will continue to follow   - Recommendations discussed with , MD    Thank you for involving in this patient's care.    For on-going clinical needs after 5 pm and until 8:30 am and on weekends, please page on call psychiatrist for urgent clinical matters at [phone]. Please input brief message of reason for call and location (e.g. 4 west, [place], and Labor & Delivery), as the psychiatrist on call is covering multiple units in the hospital.       [phone], MD  Psychiatry PGY-2, [phone]
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
## 2021-12-10 LAB — BASIC METABOLIC PANEL
ANION GAP: 11 mmol/L (ref 10–22)
BUN (UREA NITROGEN): 28 mg/dL — ABNORMAL HIGH (ref 7–18)
CALCIUM: 9.8 mg/dL (ref 8.5–10.5)
CARBON DIOXIDE: 30 mmol/L (ref 21–32)
CHLORIDE: 99 mmol/L (ref 98–107)
CREATININE: 1 mg/dL (ref 0.4–1.2)
ESTIMATED GLOMERULAR FILT RATE: >60 mL/min (ref >60)
Glucose Random: 149 mg/dL (ref 74–160)
POTASSIUM: 3.9 mmol/L (ref 3.5–5.1)
SODIUM: 140 mmol/L (ref 136–145)

## 2021-12-10 LAB — LIPID PANEL
Cholesterol: 187 mg/dL (ref 0–239)
HIGH DENSITY LIPOPROTEIN: 39 mg/dL — ABNORMAL LOW (ref 40–60)
LOW DENSITY LIPOPROTEIN DIRECT: 111 mg/dL (ref 0–189)
TRIGLYCERIDES: 288 mg/dL — ABNORMAL HIGH (ref 0–150)

## 2021-12-10 LAB — POTASSIUM: POTASSIUM: 4.4 mmol/L (ref 3.5–5.1)

## 2021-12-10 LAB — PHOSPHORUS MAGNESIUM
MAGNESIUM: 2 mg/dL (ref 1.6–2.6)
PHOSPHORUS: 4.3 mg/dL (ref 2.5–4.9)

## 2021-12-10 LAB — HEPATITIS C PCR QUANT: HEPATITIS C PCR IU/ML: 0 IU/ml

## 2021-12-10 MED ORDER — BUPRENORPHINE HCL-NALOXONE HCL 8-2 MG SL SUBL
4.0000 mg | SUBLINGUAL_TABLET | Freq: Two times a day (BID) | SUBLINGUAL | Status: DC | PRN
Start: 2021-12-10 — End: 2021-12-17
  Administered 2021-12-11 – 2021-12-16 (×3): 0.5 via SUBLINGUAL
  Filled 2021-12-10 (×3): qty 1

## 2021-12-10 MED ORDER — QUETIAPINE FUMARATE 50 MG PO TABS
50.0000 mg | ORAL_TABLET | Freq: Every evening | ORAL | Status: DC | PRN
Start: 2021-12-10 — End: 2021-12-10

## 2021-12-10 MED ORDER — QUETIAPINE FUMARATE 50 MG PO TABS
50.0000 mg | ORAL_TABLET | Freq: Every evening | ORAL | Status: DC
Start: 2021-12-10 — End: 2021-12-17
  Administered 2021-12-10 – 2021-12-16 (×7): 50 mg via ORAL
  Filled 2021-12-10 (×7): qty 1

## 2021-12-10 NOTE — Consults (Signed)
Psychiatry CL Service Follow-Up Note    Prior psychiatry consult notes as well as interim history reviewed in EPIC.   Interval Events: working with PT (recommending STR on dispo), systems meeting held and patient will move forward with trying to get own housing with [facility] and [facility] services. Patient had a minor fall last night, tried to get from commode to bed on her own   On interview, patient is cooperative and pleasant. States feeling "so-so." Continues reporting pain on L side, but overall has had some improvement inpain. Confused about surgical plan and why it has to be on outpatient basis. Continues feeling overwhelmed to the point of some passive thoughts of "wanting it to be over," but denies plans or intent, denies HI, denies AVH. Some improvements in nightmares with increase in prazosin.    BP 119/76    Pulse 82    Temp 97.8 F (36.6 C) (Oral)    Resp 22    Ht 5\' 9"  (1.753 m)    Wt (!) 164.6 kg (362 lb 14.4 oz)    SpO2 93%    BMI 53.59 kg/m     New Relevant Labs/Studies:   BMP:   Recent Labs     12/09/21  0730 12/10/21  0710   NA 143 140   K 4.2 3.9   CL 102 99   CO2 30 30   BUN 26* 28*   CREAT 1.0 1.0   GLUCOSER 130 149     Ca, Mg, Phos:   Recent Labs     12/09/21  0730 12/10/21  0710   CA 9.8 9.8   MG 2.1 2.0   PHOS 4.0 4.3     CBC:   Recent Labs     12/09/21  0730   WBC 7.8   HGB 14.1   HCT 46.7*   PLTA 300   RBC 4.67     Coagulation Labs: No results for input(s): PT, APTT, INR in the last 72 hours.  LFTs: No results for input(s): AST, ALT, TBILI, DBILI, ALKPHOS, ALBUMIN in the last 72 hours.  Pancreatic Enzymes: No results for input(s): AMY, LIP in the last 72 hours.  Urinalysis:No results for input(s): UACOL, UACLA, UAGLU, UABIL, UAKET, SPEGRAVURINE, UAOCC, UAPH, UAPRO, UANIT, LEUKOCYTES in the last 72 hours.    Invalid input(s): UAOBU  Troponin: No results for input(s): TROPI in the last 72 hours.  NT-proBNP:   No results for input(s): PROBNP in the last 72 hours.  Lactic acid: No results for  input(s): LACTICACID in the last 72 hours.    Mental Status Exam:  Mental Status Exam   General Appearance: Clean;Dressed in hospital gown/johnny  Behavior: Cooperative;Good eye contact  Level of Consciousness: Alert  Orientation Level: Grossly Intact  Attention/Concentration: WNL  Mannerisms/Movements: No abnormal mannerisms/movements  Speech Quality and Rate: WNL  Speech Clarity: Clear  Speech Tone: Normal vocal inflection  Vocabulary/Fund of Knowledge: WNL  Memory: Grossly intact  Thought Process & Associations: Goal-directed;Logical;Organized;Linear  Dissociative Symptoms: None  Thought Content: No abnormalities reported or observed  Delusions: None  Hallucinations: None  Suicidal Thoughts: Passive thoughts;Reasons for not doing it;Future oriented  Homicidal Thoughts: None  Mood: Euthymic;Content  * Mood comment: "so so"  Affect: Congruent with mood  Judgment: Fair  Insight: Fair    Medication:  Scheduled medications:    QUEtiapine  50 mg Oral Nightly    torsemide  100 mg Oral BID    prazosin  3 mg Oral Nightly  acetaminophen  975 mg Oral Q6H [place]    diclofenac  4 g Topical 4x Daily    FLUoxetine  20 mg Oral Daily    heparin (porcine)  5,000 Units Subcutaneous Q12H [place]    lidocaine  1 patch Topical Q24H    acamprosate  666 mg Oral TID    aspirin  81 mg Oral Daily    atorvastatin  80 mg Oral Nightly    budesonide-formoterol  2 puff Inhalation BID    buprenorphine-naloxone  8 mg Sublingual TID    docusate sodium  100 mg Oral BID    ferrous gluconate  324 mg Oral Once per day on Mon Wed Fri    fluticasone  1 spray Each Nostril BID    gabapentin  600 mg Oral TID    levETIRAcetam  750 mg Oral BID    levothyroxine  25 mcg Oral DAILY    loxapine  25 mg Oral BID    melatonin  3 mg Oral Nightly    methocarbamol  750 mg Oral TID AC    nicotine  1 patch Transdermal Daily    OXcarbazepine  150 mg Oral BID    pantoprazole  40 mg Oral Daily    sennosides  17.2 mg Oral Nightly    thiamine  100 mg  Oral Daily       As needed medications:   Current Facility-Administered Medications   Medication Dose Route Frequency Last Admin    buprenorphine-naloxone  4 mg Sublingual BID PRN      LORazepam  1 mg Oral Q4H PRN 1 mg at 12/10/21 [id number]    naproxen  500 mg Oral Q12H PRN 500 mg at 12/09/21 2156    albuterol HFA  2 puff Inhalation Q4H PRN      aluminum-magnesium hydroxide-simethicone  30 mL Oral Q2H PRN      eucerin   Topical Q12H PRN      magnesium hydroxide  30 mL Oral Daily PRN      nicotine polacrilex  2 mg Oral Q1H PRN 2 mg at 12/10/21 [id number]    nystatin   Topical BID PRN Given at 12/07/21 2002        Assessment/Impressions:   51F, English-speaking, charted diagnoses of PTSD, SCAD, BPAD, PSUD (mainly alcohol), multiple prior psych hospitalizations, multiple prior suicide attempts and SIB requiring medical attention, hx head trauma with seizure disorder, med hx COPD and CHF, has [facility] services who returns to medical floor from psych admission s/p mechanical fall with head strike now working with PT for mobility and strength, with psych consulted for SI precautions.     The patient is now denying thoughts and plans of SI, reports that her disturbing hallucinations from last week have improved. She has been in behavioral control while admitted to psych as well as the days before that admission. She is at chronic increased risk of harm to self due to history of multiple attempts (including one this month), lack of social supports. However, this is mitigated by her help-seeking behavior and cooperation with care. She has also not exhibited any self-injurious behaviors for over a week now, as well as being able to safety plan, including willingness to remain in the hospital for care and being agreeable with discharge to STR. At the moment she does not meet Section 12 criteria for an involuntary hold.     3/23: normative stress response to pain, current health issues. Still complains of nightmares. Per chart review  SBP has generally  been >100, reasonable to increase prazosin.  3/24: changed outpt follow-up appointment to 4/5 10AM virtual with [place], her psych NP.      DSM 5 Diagnoses:  Primary Psychiatric Diagnosis: PTSD  Secondary Psychiatric Diagnosis:  Depressive disorder   Psychosocial Stressors: Housing instability, past trauma of son dying from [place] in front of her    Recommendations - new additions in bold:   - continue with psych meds as indicated on d/c summary from last inpatient psychiatry admission   - c/w prazosin 3mg  qhs with holding parameters for sbp<90  - no need for safety watch; patient does not meet S12 criteria  - we are working to schedule appointment with her outpatient psychiatrist  - dispo to STR is reasonable   - we will continue to follow   - Recommendations discussed with Richardean Sale, MD    Thank you for involving [place] in this patient's care.    For on-going clinical needs after 5 pm and until 8:30 am and on weekends, please page on call psychiatrist for urgent clinical matters at [phone]. Please input brief message of reason for call and location (e.g. 4 west, [place]), as the psychiatrist on call is covering multiple units in the hospital.       Scherrie November, MD  Psychiatry PGY-2, [phone]

## 2021-12-10 NOTE — Progress Notes (Signed)
CASE MANAGER REASSESSMENT NOTE      Case Reviewed with: MDR attendants     Level of Care:  IP      Next review date if insurance: Daily    Change in status from initial assessment: N/A    New information that will affect transition plan: No    Anticipated Discharge plan: Discussed pt during MDR. Remains medically clear, no bed offer yet. [facility] updated. Bed search expanded.    Per patient choice, referrals made to:     [facility]- referral sent, pending  [facility]- referral sent, pending  [facility]- referral sent, pending  [facility]- referral sent, pending  [facility]- referral sent, called & LM, pending  [facility] [address]- referral sent, pending  [facility]- referral sent, pending  [facility]- referral sent, pending  [facility]- referral sent, pending  [facility]- called & LM, pending  [facility]- called & LM, pending  [facility] called & LM, pending  [facility]-called & LM, pending  [facility]- called & LM, pending  [facility]- declined  [facility]- declined  [facility]- declined  [facility]-declined

## 2021-12-10 NOTE — Plan of Care (Addendum)
Received patient at change of shift. Patient is alert and oriented x4. Lung sounds clear/dim. Patient reports feeling sob at times and requested 2L o2 for comfort at night. VSS, 1+ edema to RLE, 2+ edema to LLE. Abdomen soft, +bowel sounds. Purewick in place draining yellow urine. Scattered bruising noted. Scars noted to bilat arms. Medications administered as ordered, bed alarm on, call light within reach. Will continue to monitor and assess.     Event note- At approx 2015, patient was heard yelling for help as patient was on floor in between recliner and commode, next to bed. Per patient and CNA, patient was helped to commode by CNA and patient did not call because patient thought "I could make it" to bed independently. RN did not witness fall.  Patient reported that as patient was grabbing on to bed, "my left leg slipped from under me" and patient slid off of commode. Patient reports landing on right hip, denies head strike. Patient was transported back to bed with assist from multiple staff and hoyer lift. Dr. Sherley Bounds notified and assessed patient. Patient reported more pain to left knee, denies pain to right hip or RLE. Patient able to move RLE freely, guarded with left knee. MD ordered one time of IV Toradol for pain. VSS- 111/77, HR 84, 92% RA. Cold pack applied to left knee. Charge RN aware, Mickie Hillier aware. Educated patient on importance of using call light to call for help for safety, patient verbalized agreement and understanding. Bed alarm on, call light within reach.      Addendum 1017- pt up in chair per patient's request. Educated patient about using call light if patient needed anything, fall precautions and safety. Patient verbalized agreement and understanding. Chair alarm on and working. Call light within reach. Will continue to monitor.       Problem: Pain  Goal: Patient's pain/discomfort is manageable  Description: Assess and monitor patient's pain using appropriate pain scale. Collaborate  with interdisciplinary team and initiate plan and interventions as ordered. Re-assess patient's pain level 30 - 60 minutes after pain management intervention.   Intervention: Assess patient's pain level  Note: Pain level assessed, patient received one time order of IV Toradol as well as scheduled medications. Will continue to monitor.

## 2021-12-10 NOTE — Progress Notes (Signed)
PROGRESS NOTE  12/10/2021    PATIENT INFO: Cheryl Zimmerman, 7 51 year old female  DATE OF ADMISSION: 12/05/21    ROOM/BED LOCATION:  610/610-A  HOSPITAL DAY: 2    REVIEW OF OVERNIGHT EVENTS:  Pt slipped and fell without head strike, no significant residual pain    SUBJECTIVE:  Pt notes L knee pain is still present, unchanged from day prior  Asked for O2 overnight, felt breathing at baseline    OBJECTIVE:    VITAL SIGNS      12/10/21  0518 12/10/21  0650 12/10/21  0855 12/10/21  0856   BP: 135/84  135/84    Pulse: 84      Resp: Temp: 97.6 F (36.4 C)      TempSrc: Oral      SpO2: 91%      Weight: (!) 164.6 kg (362 lb 14.4 oz)      Height:           INS/OUTS (PAST 24 HOURS)  I/O 24 Hrs:  In: 1320 [P.O.:1320]  Out: 700 [Urine:700]    PHYSICAL EXAM   GEN:      Uncomfortable, frustrated, no acute distress  CV:      unremarkable  PULM:      Diminished breath sounds  ABD:      NTND, soft  EXT:      Pitting edema 2+ to mid-shin b/l  MSK:  Mild swelling L knee, moderately TTP most notable at anteromedial joint space,  ROM limited to <45 deg. Due to pain, not erythematous, no fluctuance  SKIN:      No visible rash  NEURO:    Alert and oriented- neuro exam limited by habitus and pain, though grossly normal sensation lower extremities b/l    RECENT LABS  BMP:   Recent Labs     12/09/21  0730 12/10/21  0710   NA 143 140   K 4.2 3.9   CL 102 99   CO2 30 30   BUN 26* 28*   CREAT 1.0 1.0   GLUCOSER 130 149     Ca, Mg, Phos:   Recent Labs     12/09/21  0730 12/10/21  0710   CA 9.8 9.8   MG 2.1 2.0   PHOS 4.0 4.3     CBC:   Recent Labs     12/09/21  0730   WBC 7.8   HCT 46.7*   HGB 14.1   PLTA 300   RBC 4.67     Coagulation Labs: No results for input(s): PT, INR in the last 72 hours.    Invalid input(s): PTT  LFTs: No results for input(s): AST, ALT, TBILI, DBILI, ALKPHOS in the last 72 hours.  Pancreatic Enzymes: No results for input(s): AMY, LIP in the last 72 hours.  Urinalysis:No results for input(s):  UACOL, UACLA, UAGLU, UABIL, UAKET, SPEGRAVURINE, UAOCC, UAPH, UAPRO, UANIT, LEUKOCYTES in the last 72 hours.    Invalid input(s): UAOBU  Troponin:  TROPONIN T HS RANDOM (ng/L)   Date Value   11/30/2021 17 (H)     DELTA 1 HOUR TROPONIN T HS (ng/L)   Date Value   11/24/2021 0     DELTA 3 HOUR TROPONIN T HS (ng/L)   Date Value   11/24/2021 1           MICROBIOLOGY REVIEW  No new microbiology results to review.    IMAGING AND OTHER STUDIES  XR  L hip:  Impression:     No visible fracture in this single view. If symptoms persist then a complete hip study is suggested.     CT head w/out contrast:  IMPRESSION:     No CT evidence of acute intracranial abnormality.      XR Knee L:    Impression:     Normal left knee radiographs.         MEDICATIONS  See list in EPIC    ASSESSMENT & PLAN   51 year-old female with history ofhousing instability,bipolar disorder, schizoaffective disorder, substance use disorder,PTSD, multiple suicide attempts,systolic congestive heart failure, seizure disorder on keppra, complex pain syndrome, COPD, bronchial asthma, and obesity, presenting with suicidal ideationadmitted to the inpatient Psychiatry unit for management of suicidal ideation, now resolved. Patient is unable to use walker due to L knee pain and required wheelchair 3/19 at [place]. Wheelchair is unable to fit through doors at [employer]. Admission to medicine for further monitoring and pain control.    #Mechanical fall  #LLE pain  #Lateral and medial menisci tear  Patient sustained mechanical fall 3/19 while using walker. Pt notes L knee "giving out" which on chart review has occurred in past.Pt notes L knee "hurt" when gave out but no pop/tear anything pt could appreciate. L knee also hurt from contact w/ floor.  Head CT did not show any acute intracranial pathology and L knee XR and L hip XR did not show any fractures.   Neuro exam 3/20 difficult to assess and limited 2/2 pain but grossly WNL. Mildly swollen though no  other joint deformity or concerning features, most likely etiology of pain is swelling from trauma. Difficult to assess ligamentous damage as exam limited. If lack of improvement may consider imaging.   3/20 Pt unable to bear weight when working w/ PT, though improved 3/21 (see PT note). Ortho did not feel further imaging necessary, however on re-evaluation felt MRI appropriate. MRI showed medial and lateral menisci tear  -Pain: Tylenol increased to 975 q6hr, naproxen 500 BID, diclofenac gel, lidocaine patch, ice pack (& home suboxone and other chronic pain medications, see below); *added buprenorphine 4 BID PRN for breakthrough knee pain  -appreciate PT recs  -appreciate ortho recommendations; currently will f/u outpatient 3/28, ace wrap currently- no brace for leg size while admitted, STR may be able to order vs may be able to obtain in clinic 3/28    #Complex chronic pain syndrome  #Lumbar spinal stenosis  #Sciatica:  -Buprenorphine-naloxone 8-2TID  -Fluoxetine  -Gabapentin  -Methocarbamol  -Maintain activity as able  -PT, STR, chronic pain clinic follow up    #Suicidal ideation  #Schizoaffective disorder, bipolar type  #PTSD  #Anxiety  #Grief reaction  #History of suicide attempts  -Resolved per psych eval, no need for 1:1 or to return to psych once medically optimized.  -Continue current regimen: haldol, fluoxetine, loxapine, gabapentin, prazosin (increased from 2->3mg  nightly  3/23 per psych), melatonin, and as needed lorazepam, seroquel  To f/u w/ outpatient psychiatrist  [ ]  may benefit from rehab, VNA, housing support  *meeting 3/23, SW indicating pt to go to respite bed post STR- [facility] and [facility] will continue to provide support (see SW note 3/23)    #Substance use disorder  #History of alcohol withdrawal, alcohol use disorder  #h/o housing instability  No current withdrawal  -Acamprosate  -Buprenorphine-naloxone (on for chronic pain per pt, though there is some note of fentanyl use per chart review-  pt reports never  using IV opioids; has [facility] + [facility] 3/23 for Fentanyl, unclear if contaminant or intentional use)  - unclear history, though per chart review has been noted to use opioids (though pt reports no opioid use other than suboxone for chronic pain); also documentation of cocaine use.  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 Repeat ECHO this admission WNL EF 70%  Initially, patient was noted to be hypoxemic during previous admission but this resolved with diuresis. Felt to be volume overloaded d/t acute decompensated HF iso decreased diuretic dose (see d/c sum) . D/c'd on 80 torsemide BID, discharge weight ~330s though unclear? If these are bed weights or standing. Notably, BNP was 40s at this time, whereas during exacerbation in 2022 requiring IV diuresis NT proBNP 2000. Weight at discharge 2022 300s.   Current weight 340s but again bed weights difficult to rely on heavily, and pt unable to stand currently. Pt with 2+ pitting edema mid shin b/l though has some baseline LE pitting edema- has baseline orthopnea though no worsening, no other signs of volume overload on exam though exam limited. Challenging weights and volume status, though wonder if pt is chronically overloaded.  Etiology of HF unclear (though possibly alcohol + cocaine use contributory?)  Patient also reports that her "O2 levels decreased at night" and reports that she has sleep apnea but no documented or reported history of CPAP use or sleep study.   3/21 decent UOP though with no improvement in orthopnea, still appears overloaded though exam challenging. May diuresis until Cr  bump vs outpatient management  3/22 AM UOP unimpressive and no Cr bump, sx and exam challenging to rely on thus increased torsemide to 120mg  PM  3/23 BUN and Cr bump on 120mg  BID- thus returned to 100mg  BID as maintenance dose. Volume status and weight unable to perform reliably due to habitus, knee injury.  -Budesonide-formoterol  -Fluticasone nasal spray  -Review and consider the addition of a long-acting antimuscarinic agent for COPD maintenance regimen as TI, though pt may need PFTs and sleep study first  -3/22 Torsemide to 120 mg BID; strict Is/Os (purewick in place); may consider metolazone augmentation pending UOP  -Nicotine replacement therapy and smoking cessation support  -Rescue albuterol as needed  [ ] will consider outpatient sleep study  [ ] Recommend outpatientPulmonology follow up/PFTs    #Hypokalemia, resolved  Potassium3.2in the morning of 3/16. Magnesium was 2. This is in the context of high-dose diuretics, s/p supplementation  -BMP trend     #Dyslipidemia:   LDL 111  -Atorvastatin  -Low-dose aspirin    #Seizure disorder  -Continue levetiracetam, gabapentin, oxcarbazepine    #Tobacco use disorder:  -Transdermal nicotine as needed  - nicotine gum as needed  [ ]  Consider ongoing pharmacologic treatment and connection with behavioral modification program    #Hepatitis C  -Hep C PCR pending    #Hypothyroidism:History of levothyroxine use.TSH normal on 11/24/2021.  -Levothyroxine    #Atrial fibrillation? (pt self reported?)  #question of stroke?  NSR on admission; History is tricky as most info is from incomplete OSH records, though seems like clinically may have presented with stroke coupe + yrs ago, and has had more  than one presentation with concern for stroke, though most recently had CT and MRI that were without acute stroke.  If afib observed on tele may consider doac  -Low-dose aspirin, atorvastatin  -Continue to monitorrhythm    #Gastroesophageal reflux  disease:  -Pantoprazole  -Antireflux hygiene    #Bee VenomAllergy: History of anaphylaxis    prescription for epinephrine pen and teaching at d/c     Diet: cardiac diet    DVT Prophylaxis: SQ hep    Medication Reconciliation: Done - continued meds from [facility], followed by medicine  Was medication list from nursing facility collected & reviewed? n/a  Changes from home medication regimen: as above.    Code Status: Full Code  Health care proxy: Data Unavailable  Data Unavailable    Dispo: Admission for pain control and monitoring

## 2021-12-10 NOTE — Consults (Signed)
Called Cheryl Zimmerman [phone]), the patient's psych NP     Previous appointment made for:     Wednesday 12/15/2021 [facility] program)  [address], [place] [address]     Will now be changed to:    Wednesday 12/22/2021 10am - VIRTUAL appointment       Scherrie November, MD  Psychiatry PGY-2, [phone]

## 2021-12-10 NOTE — Progress Notes (Signed)
Inpatient Physical Therapy Treatment Note  S: "I'll do some bed exercises, but can't get up. My left knee is killing me since I fell last night." - Patient somnolent and nodding off frequently during PT session. Notified RN, RN suspects somnolence from medications.   O: Please see Vital Sign and Pain Assessment flowsheets for vitals and pain documentation.  Plan of care has been reviewed and updated as necessary.       12/10/21 1402   Language Information   Language of Care English   Rehab Discipline   Rehab Discipline PT   General   Response to Previous Treatment Patient reporting fatigue but able to participate   Family/Caregiver Present No   Mobility / Balance   Mobility / Balance No   Bed Mobility   Rolling   (refused d/t knee pain flare up since fall)   Supine to Sit   (refused d/t knee pain flare up since fall)   Sit to Supine   (refused d/t knee pain flare up since fall)   Transfers   Transfer No   Ther Exercise   Named exercise Ankle Pumps   Position Supine   Ther Exercise 2   Named Exercise 2 Quad Sets   Position 2 Supine   Ther Exercise 3   Named exercise 3 Heel Slides   Position 3 Supine   Motion 3 AROM;PROM  (AROM R LE; PROM L LE d/t L knee pain)   Ther Exercise 4   Named exercise 4   (hip abduction/adduction)   Motion 4 PROM;AROM  (AROM R LE; PROM L LE d/t L knee pain)   Position 4 Supine   Ther Exercise 5   Named exercise 5 Glut Sets   Position 5 Supine   Coordination   Gross Motor Performance Observation Stiff;Tense;Weak;Falls easily   Safety Devices   Type of Devices Call bell in place;Bed alarm in place   Plan   Prognosis Fair   PT Frequency 5x/wk   Recommendation   Recommendation Short-term skilled PT   Equipment Recommended Rolling walker (Front wheel walker)  (wheelchair as needed)     A: Patient demonstrated a fair response to PT as evidence by refusing OOB mobility d/t acute flare up of L knee from falling from bedside commode last night, needing 5 staff and hoyer lift to bring patient back to  bed. Patient only agreeable to supine exercises, but poor tolerance to therex d/t L knee pain, only able to get minimal PROM L knee flexion (~ 5-10 degrees) d/t significant pain causing patient to scream throughout. Iced applied to patients L knee after therex for pain management. Rehab team will f/u with patient and keep assessing her status and encourage OOB mobility as tolerated.   P: Continue per PT Care Plan. Recommend STR upon d/c given her significant functional limitations, need for assistance, and inability to ambulate (ambulated 45ft with rollator yesterday, but fell last night so will likely experience functional decline d/t worsening L knee pain).        Cheryl Zimmerman, [place], [place] # [id number]

## 2021-12-11 MED ORDER — ACETAMINOPHEN 325 MG PO TABS
975.0000 mg | ORAL_TABLET | Freq: Four times a day (QID) | ORAL | Status: DC
Start: 2021-12-11 — End: 2021-12-11
  Administered 2021-12-11: 975 mg via ORAL
  Filled 2021-12-11: qty 3

## 2021-12-11 MED ORDER — LOXAPINE SUCCINATE 5 MG PO CAPS
30.0000 mg | ORAL_CAPSULE | Freq: Two times a day (BID) | ORAL | Status: DC
Start: 2021-12-11 — End: 2021-12-14
  Administered 2021-12-11 – 2021-12-14 (×6): 30 mg via ORAL
  Filled 2021-12-11 (×6): qty 1

## 2021-12-11 MED ORDER — ACETAMINOPHEN 325 MG PO TABS
650.0000 mg | ORAL_TABLET | Freq: Four times a day (QID) | ORAL | Status: DC
Start: 2021-12-12 — End: 2021-12-17
  Administered 2021-12-11 – 2021-12-17 (×18): 650 mg via ORAL
  Filled 2021-12-11 (×18): qty 2

## 2021-12-11 NOTE — Consults (Signed)
Psychiatry CL Service Follow-Up Note    Prior psychiatry consult notes as well as interim history reviewed in EPIC.   Interval Events: working with PT (recommending STR on dispo). Bed search continues   On interview, patient is cooperative and pleasant. A bit anxious. Discusses that she has started having visions of her son again - stressful, and would like some help with medications around this. Also hearing voices - at first reluctant but does share that they have been telling her to hurt herself. She adamantly denies that she has plans to hurt herself, however, and feels safe in the hospital. No HI or other AVH.   She states that the current housing plan is for her to go to STR then respite while awaiting independent housing, which is consistent with what has been charted. She reports intention to continue staying sober from substances and is open to being seen by a recovery coach while hospitalized. She asked for a bible and coloring materials as these are helpful coping skills for her. When asked about her spirituality, states it is very important for her, identifies as [facility], but declines offer for chaplain to see her, stating that he's Catholic and "I don't deal with Catholics."     BP [id number]    Pulse 88    Temp 98.1 F (36.7 C) (Oral)    Resp 18    Ht 5\' 9"  (1.753 m)    Wt (!) 164.6 kg (362 lb 14.4 oz)    SpO2 93%    BMI 53.59 kg/m     New Relevant Labs/Studies:   BMP:   Recent Labs     12/09/21  0730 12/10/21  0710   NA 143 140   K 4.2 3.9   CL 102 99   CO2 30 30   BUN 26* 28*   CREAT 1.0 1.0   GLUCOSER 130 149     Ca, Mg, Phos:   Recent Labs     12/09/21  0730 12/10/21  0710   CA 9.8 9.8   MG 2.1 2.0   PHOS 4.0 4.3     CBC:   Recent Labs     12/09/21  0730   WBC 7.8   HGB 14.1   HCT 46.7*   PLTA 300   RBC 4.67     Coagulation Labs: No results for input(s): PT, APTT, INR in the last 72 hours.  LFTs: No results for input(s): AST, ALT, TBILI, DBILI, ALKPHOS, ALBUMIN in the last 72  hours.  Pancreatic Enzymes: No results for input(s): AMY, LIP in the last 72 hours.  Urinalysis:No results for input(s): UACOL, UACLA, UAGLU, UABIL, UAKET, SPEGRAVURINE, UAOCC, UAPH, UAPRO, UANIT, LEUKOCYTES in the last 72 hours.    Invalid input(s): UAOBU  Troponin: No results for input(s): TROPI in the last 72 hours.  NT-proBNP:   No results for input(s): PROBNP in the last 72 hours.  Lactic acid: No results for input(s): LACTICACID in the last 72 hours.    Mental Status Exam:  Mental Status Exam   General Appearance: Clean;Dressed in hospital gown/johnny  Behavior: Cooperative;Good eye contact  Level of Consciousness: Alert  Orientation Level: Grossly Intact  Attention/Concentration: WNL  Mannerisms/Movements: No abnormal mannerisms/movements  Speech Quality and Rate: WNL  Speech Clarity: Clear  Speech Tone: Normal vocal inflection  Vocabulary/Fund of Knowledge: WNL  Memory: Grossly intact  Thought Process & Associations: Goal-directed;Logical;Organized;Linear  Dissociative Symptoms: None  Thought Content: No abnormalities reported or observed  Delusions: None  Hallucinations: Auditory;Command;Visual  Suicidal Thoughts: Passive thoughts;Reasons for not doing it;Future oriented  Homicidal Thoughts: None  Mood: Anxious  * Mood comment: "so so"  Affect: Congruent with mood  Judgment: Fair  Insight: Fair    Medication:  Scheduled medications:    QUEtiapine  50 mg Oral Nightly    torsemide  100 mg Oral BID    prazosin  3 mg Oral Nightly    acetaminophen  975 mg Oral Q6H SCH    diclofenac  4 g Topical 4x Daily    FLUoxetine  20 mg Oral Daily    heparin (porcine)  5,000 Units Subcutaneous Q12H Gage    lidocaine  1 patch Topical Q24H    acamprosate  666 mg Oral TID    aspirin  81 mg Oral Daily    atorvastatin  80 mg Oral Nightly    budesonide-formoterol  2 puff Inhalation BID    buprenorphine-naloxone  8 mg Sublingual TID    docusate sodium  100 mg Oral BID    ferrous gluconate  324 mg Oral Once per day on  Mon Wed Fri    fluticasone  1 spray Each Nostril BID    gabapentin  600 mg Oral TID    levETIRAcetam  750 mg Oral BID    levothyroxine  25 mcg Oral DAILY    loxapine  25 mg Oral BID    melatonin  3 mg Oral Nightly    methocarbamol  750 mg Oral TID AC    nicotine  1 patch Transdermal Daily    OXcarbazepine  150 mg Oral BID    pantoprazole  40 mg Oral Daily    sennosides  17.2 mg Oral Nightly    thiamine  100 mg Oral Daily       As needed medications:   Current Facility-Administered Medications   Medication Dose Route Frequency Last Admin    buprenorphine-naloxone  4 mg Sublingual BID PRN 0.5 tablet at 12/11/21 [id number]    LORazepam  1 mg Oral Q4H PRN 1 mg at 12/11/21 [id number]    naproxen  500 mg Oral Q12H PRN 500 mg at 12/11/21 0519    albuterol HFA  2 puff Inhalation Q4H PRN      aluminum-magnesium hydroxide-simethicone  30 mL Oral Q2H PRN      eucerin   Topical Q12H PRN      magnesium hydroxide  30 mL Oral Daily PRN      nicotine polacrilex  2 mg Oral Q1H PRN 2 mg at 12/11/21 [id number]    nystatin   Topical BID PRN Given at 12/07/21 2002        Assessment/Impressions:   66F, English-speaking, charted diagnoses of PTSD, SCAD, BPAD, PSUD (mainly alcohol), multiple prior psych hospitalizations, multiple prior suicide attempts and SIB requiring medical attention, hx head trauma with seizure disorder, med hx COPD and CHF, has [facility] services who returns to medical floor from psych admission s/p mechanical fall with head strike now working with PT for mobility and strength, with psych consulted for SI precautions.     The patient is now denying thoughts and plans of SI, reports that her disturbing hallucinations from last week have improved. She has been in behavioral control while admitted to psych as well as the days before that admission. She is at chronic increased risk of harm to self due to history of multiple attempts (including one this month), lack of social supports. However, this is mitigated by her  help-seeking behavior and cooperation  with care. She has also not exhibited any self-injurious behaviors for over a week now, as well as being able to safety plan, including willingness to remain in the hospital for care and being agreeable with discharge to STR. At the moment she does not meet Section 12 criteria for an involuntary hold.     3/23: normative stress response to pain, current health issues. Still complains of nightmares. Per chart review SBP has generally been >100, reasonable to increase prazosin.  3/24: changed outpt follow-up appointment to 4/5 10AM virtual with [place], her psych NP.  3/25: increased hallucinations. No acute safety issues. Will go up on antipsychotic. Patient would benefit from increased supports in the hospital including recovery coach and engaging in coping skills (reading the bible, coloring)      DSM 5 Diagnoses:  Primary Psychiatric Diagnosis: PTSD  Secondary Psychiatric Diagnosis:  Depressive disorder   Psychosocial Stressors: Housing instability, past trauma of son dying from [place] in front of her    Recommendations - new additions in bold:   - continue with psych meds as indicated on d/c summary from last inpatient psychiatry admission   - c/w prazosin 3mg  qhs with holding parameters for sbp<90   - please increase loxapine from 25mg  BID to 30mg  BID   - please consult recovery coach for additional support   - no need for safety watch; patient does not meet S12 criteria  - we are working to schedule appointment with her outpatient psychiatrist  - dispo to STR is reasonable   - we will continue to follow     Thank you for involving [place] in this patient's care.    For on-going clinical needs after 5 pm and until 8:30 am and on weekends, please page on call psychiatrist for urgent clinical matters at [phone]. Please input brief message of reason for call and location (e.g. [address], [place]), as the psychiatrist on call is covering multiple units in the  hospital.       Scherrie November, MD  Psychiatry PGY-2, [phone]

## 2021-12-11 NOTE — Progress Notes (Signed)
PROGRESS NOTE  12/11/2021    PATIENT INFO: Cheryl Zimmerman, 40nglish-SPEAKING 51 year old female  DATE OF ADMISSION: 12/05/21    ROOM/BED LOCATION:  610/610-A  HOSPITAL DAY: 3    REVIEW OF OVERNIGHT EVENTS:  Pt slipped and fell without head strike, no significant residual pain    SUBJECTIVE:  Pt notes L knee pain is still present, unchanged from day prior  Notes seeing her deceased family member (notes these are hallucinations)    OBJECTIVE:    VITAL SIGNS      12/11/21  0513 12/11/21  0624 12/11/21  0748 12/11/21  0833   BP: 126/87  119/65    Pulse: 88      Resp: 20 20  18    Temp: 98.1 F (36.7 C)      TempSrc: Oral      SpO2: 93%      Weight:       Height:           INS/OUTS (PAST 24 HOURS)  I/O 24 Hrs:  In: 1200 [P.O.:1200]  Out: 2400 [Urine:2400]    PHYSICAL EXAM   GEN:      Uncomfortable, frustrated, no acute distress  CV:      unremarkable  PULM:      Diminished breath sounds  ABD:      NTND, soft  EXT:      Pitting edema 2+ to mid-shin b/l  MSK:  Mild swelling L knee, moderately TTP most notable at anteromedial joint space,  ROM limited to <45 deg. Due to pain, not erythematous, no fluctuance  SKIN:      No visible rash  NEURO:    Alert and oriented- neuro exam limited by habitus and pain, though grossly normal sensation lower extremities b/l    RECENT LABS  BMP:   Recent Labs     12/09/21  0730 12/10/21  0710   NA 143 140   K 4.2 3.9   CL 102 99   CO2 30 30   BUN 26* 28*   CREAT 1.0 1.0   GLUCOSER 130 149     Ca, Mg, Phos:   Recent Labs     12/09/21  0730 12/10/21  0710   CA 9.8 9.8   MG 2.1 2.0   PHOS 4.0 4.3     CBC:   Recent Labs     12/09/21  0730   WBC 7.8   HCT 46.7*   HGB 14.1   PLTA 300   RBC 4.67     Coagulation Labs: No results for input(s): PT, INR in the last 72 hours.    Invalid input(s): PTT  LFTs: No results for input(s): AST, ALT, TBILI, DBILI, ALKPHOS in the last 72 hours.  Pancreatic Enzymes: No results for input(s): AMY, LIP in the last 72 hours.  Urinalysis:No results for input(s): UACOL,  UACLA, UAGLU, UABIL, UAKET, SPEGRAVURINE, UAOCC, UAPH, UAPRO, UANIT, LEUKOCYTES in the last 72 hours.    Invalid input(s): UAOBU  Troponin:  TROPONIN T HS RANDOM (ng/L)   Date Value   11/30/2021 17 (H)     DELTA 1 HOUR TROPONIN T HS (ng/L)   Date Value   11/24/2021 0     DELTA 3 HOUR TROPONIN T HS (ng/L)   Date Value   11/24/2021 1           MICROBIOLOGY REVIEW  No new microbiology results to review.    IMAGING AND OTHER STUDIES  No new imaging  MEDICATIONS  See list in EPIC    ASSESSMENT & PLAN   51 year-old female with history ofhousing instability,bipolar disorder, schizoaffective disorder, substance use disorder,PTSD, multiple suicide attempts,systolic congestive heart failure, seizure disorder on keppra, complex pain syndrome, COPD, bronchial asthma, and obesity, presenting with suicidal ideationadmitted to the inpatient Psychiatry unit for management of suicidal ideation, now resolved. Patient is unable to use walker due to L knee pain and required wheelchair 3/19 at [place]. Wheelchair is unable to fit through doors at [employer]. Admission to medicine for further monitoring and pain control.    #Mechanical fall  #LLE pain  #Lateral and medial menisci tear  Patient sustained mechanical fall 3/19 while using walker. Pt notes L knee "giving out" which on chart review has occurred in past.Pt notes L knee "hurt" when gave out but no pop/tear anything pt could appreciate. L knee also hurt from contact w/ floor.  Head CT did not show any acute intracranial pathology and L knee XR and L hip XR did not show any fractures.   Pt received MRI which showed medial and lateral menisci tear  -Pain: Tylenol decreased from 975 to 650 q6hr, naproxen 500 BID, diclofenac gel, lidocaine patch, ice pack (& home suboxone and other chronic pain medications, see below); *added buprenorphine 4mg  BID PRN for breakthrough knee pain  -appreciate PT recs  -appreciate ortho recommendations; currently will f/u outpatient 3/28,  ace wrap currently- no brace for leg size while admitted, STR may be able to order vs may be able to obtain in clinic 3/28    #Complex chronic pain syndrome  #Lumbar spinal stenosis  #Sciatica:  -Buprenorphine-naloxone 8-2TID (+ PRN suboxone for acute pain as above)  -Fluoxetine  -Gabapentin  -Methocarbamol  -Maintain activity as able  -PT, STR, chronic pain clinic follow up    #Suicidal ideation  #Schizoaffective disorder, bipolar type  #PTSD  #Anxiety  #Grief reaction  #History of suicide attempts  -SI resolved per psych eval, no need for 1:1 or to return to psych once medically optimized.  -Hallucinations 3/25 may be component? Of grief rxn vs true VH; reporting some AH as well per psych; will increase loxapine per their recs  -Continue current regimen: haldol, fluoxetine, loxapine (increased from 25 BID to 30 BID given hallucinations 3/25), gabapentin, prazosin (increased from 2->3mg  nightly  3/23 per psych), melatonin, and as needed lorazepam; seroquel nightly  To f/u w/ outpatient psychiatrist  -may benefit from rehab, VNA, housing support  [ ]  pt may benefit from someone reading bible with her, coloring, other activities while admitted (see psych note 3/25)  *meeting 3/23, SW indicating pt to go to respite bed post STR- [facility] and [facility] will continue to provide support (see SW note 3/23)    #Substance use disorder  #History of alcohol withdrawal, alcohol use disorder  #h/o housing instability  No current withdrawal  -Acamprosate  -Buprenorphine-naloxone (on for chronic pain per pt, though there is some note of fentanyl use per chart review- pt reports never using IV opioids; has UDS from [facility] 2/23 + [facility] 3/23 for Fentanyl, unclear if contaminant or intentional use)  - unclear history, though per chart review has been noted to use opioids (though pt reports no opioid use other than suboxone for chronic pain); also documentation of cocaine use.  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 Repeat ECHO this admission WNL EF 70%  Initially, patient was noted to be hypoxemic during previous admission but this resolved with diuresis. Felt to be volume overloaded d/t acute decompensated HF iso decreased diuretic dose (see d/c sum) . D/c'd on 80 torsemide BID, discharge weight ~330s though unclear? If these are bed weights or standing. Notably, BNP was 40s at this time, whereas during exacerbation in 2022 requiring IV diuresis NT proBNP 2000. Weight at discharge 2022 300s.   Current weight 340s but again bed weights difficult to rely on heavily, and pt unable to stand currently. Pt with 2+ pitting edema mid shin b/l though has some baseline LE pitting edema- has baseline orthopnea though no worsening, no other signs of volume overload on exam though exam limited. Challenging weights and volume status, though wonder if pt is chronically overloaded.  Etiology of HF unclear (though possibly alcohol + cocaine use contributory?)  Patient also reports that her "O2 levels decreased at night" and reports that she has sleep apnea but no documented or reported history of CPAP use or sleep study.   3/21 decent UOP though with no improvement in orthopnea, still appears overloaded though exam challenging. May diuresis until Cr bump vs outpatient management  3/22 AM UOP unimpressive and no Cr bump, sx and exam challenging to rely on thus increased torsemide to 120mg  PM  3/23 BUN and Cr bump on 120mg  BID- thus returned to 100mg  BID as maintenance dose. Volume status and weight unable to perform reliably due to habitus, knee injury.  -Budesonide-formoterol  -Fluticasone nasal  spray  -Review and consider the addition of a long-acting antimuscarinic agent for COPD maintenance regimen as TI, though pt may need PFTs and sleep study first  -3/22 Torsemide to 120 mg BID; strict Is/Os (purewick in place); may consider metolazone augmentation pending UOP  -Nicotine replacement therapy and smoking cessation support  -Rescue albuterol as needed  [ ] will consider outpatient sleep study  [ ] Recommend outpatientPulmonology follow up/PFTs    #Hypokalemia, resolved  Potassium3.2in the morning of 3/16. Magnesium was 2. This is in the context of high-dose diuretics, s/p supplementation  -BMP trend     #Dyslipidemia:   LDL 111  -Atorvastatin  -Low-dose aspirin    #Seizure disorder  -Continue levetiracetam, gabapentin, oxcarbazepine    #Tobacco use disorder:  -Transdermal nicotine as needed  - nicotine gum as needed  [ ]  Consider ongoing pharmacologic treatment and connection with behavioral modification program    #Hepatitis C  -Hep C PCR negative. There is documentation of "hep C w/out coma" in OSH records, though no further information. Unclear history. No further tx or f/u at this time    #Hypothyroidism:History of levothyroxine use.TSH normal on 11/24/2021.  -Levothyroxine    #Atrial fibrillation? (pt self reported?)  #question of stroke?  NSR on admission; History is tricky as most info is from incomplete OSH records, though seems like clinically may have presented with stroke coupe + yrs ago, and has had more than one presentation with concern for stroke, though most recently had CT and MRI that were without acute stroke.  If afib observed on tele may consider doac  -Low-dose aspirin, atorvastatin  -Continue to monitorrhythm    #Gastroesophageal reflux disease:  -Pantoprazole  -Antireflux hygiene    #Bee VenomAllergy: History of anaphylaxis  [ ]   prescription for epinephrine pen and teaching at d/c     Diet: cardiac diet    DVT Prophylaxis: SQ hep    Medication  Reconciliation: Done - continued meds from [facility], followed by medicine  Was medication list from nursing facility collected & reviewed? n/a  Changes from home medication regimen: as above.    Code Status: Full Code  Health care proxy: Data Unavailable  Data Unavailable    Dispo: Admission for pain control and monitoring

## 2021-12-11 NOTE — Plan of Care (Addendum)
A&Ox3, uncertain of day. Reported hallucinating this am, seeing dead son. MD notified. Psych MD arrived at bedside for assessment, see notes for details. Pt continued to escalated throughout day yelling "get out", "leave me alone". Stated her son was telling her to "do bad things to myself". Denies any thought of SI, eager improve her mental and physical health, and to go to STR, aware of need for surgery. MD notified. Distractions provided, up to commode, changing channels on TV, playing on phone. PRN Ativan and Suboxone given. 17.00 pt calm in bed, no hallucinations reports. Up to chair for meals, tolerated well. VSS, afebrile. Lung sounds clear. Fall precautions in place. Instructed to use call light when needing assistance, placed within reach. Will continue to monitor closely.       Problem: Activity:  Goal: Mobility will be supported throughout the hospitalization,  Outcome: Progressing     Problem: Cognitive:  Goal: Caregivers and patients knowledge of risk factors and measures for prevention of condition will be supported throughout the hospitalization  Outcome: Progressing     Problem: Daily Care  Goal: Daily care needs are met  Description: Assess and monitor ability to perform self care and identify potential discharge needs.  Outcome: Progressing     Problem: Pain  Goal: Patient's pain/discomfort is manageable  Description: Assess and monitor patient's pain using appropriate pain scale. Collaborate with interdisciplinary team and initiate plan and interventions as ordered. Re-assess patient's pain level 30 - 60 minutes after pain management intervention.   Outcome: Progressing     Problem: Compromised Skin Integrity  Description: Use this problem when pressure ulcers are classified as stage I or II.  Goal: Skin integrity is maintained or improved  Description: Assess and monitor skin integrity. Identify patients at risk for skin breakdown on admission and per policy. Collaborate with interdisciplinary  team and initiate plans and interventions as needed.  Outcome: Progressing     Problem: Knowledge Deficit  Goal: Patient/S.O. demonstrates understanding of disease process, treatment plan, medications, and discharge instructions.  Description: Complete learning assessment and assess knowledge base  Outcome: Progressing

## 2021-12-11 NOTE — Progress Notes (Signed)
S:  I want to try.   O: Please see Vital Sign and Pain Assessment flowsheets for vitals and pain documentation.  Plan of care has been reviewed.   12/11/21 1115   Language Information   Language of Care English   Rehab Discipline   Rehab Discipline PT   Weight Bearing Status   RLE FWB   LLE FWB   RUE FWB   LUE FWB   Mobility / Balance   Mobility / Balance Yes   Bed Mobility   Rolling Independent   Supine to Sit Independent  (rail)   Transfers   Transfer Yes   Transfer 1   Transfer From 1 Bed   Transfer Type 1 To and from   Transfer to 1 Stand   Technique 1 Sit to stand;Stand to sit   Transfer Device 1 Rolling walker (Front wheel walker)   Transfer Level of Assistance 1 Contact guard   Trials/Comments 1 2   Gait 1   Assistive Device 1 Rolling walker (Front wheel walker)   Pattern 1 L Decreased stance time;Decreased stride length  (drags left LE)   Gait Assistance 1 Contact guard;Mod verbal cues   Distance (Ft) 1 15 Feet   Balance   Sitting - Static Sits without support for > 30 sec;Feet supported   Sitting - Dynamic Moves/returns truncal midpoint 1-2 inches in multiple planes;Feet supported   Standing - Static Bilateral upper extremity supported   Standing - Dynamic Lateral lean;Forward lean   Posture Rounded shoulders;Forward head;Kyphosis   Ther Exercise   Therapeutic Exercise? Yes   Named exercise Marching;Long Arc Quads   Motion AROM  (1/2 RANGE LEFT LE)   Position Seated   Reps 8   Sets 1   Joints Left;Right   Coordination   Gross Motor Performance Observation Weak;Stiff;Tense   Safety Devices   Type of Devices Call bell in place;Bed alarm in place   Plan   Prognosis Fair   PT Frequency 5x/wk   Recommendation   Recommendation Short-term skilled PT   Equipment Recommended Rolling walker (Front wheel walker)     Hallucinating on arrival. Overheard Pt talking to herself," just get my son out of here".    A:  Pt in bed on arrival.   Pt easily persuaded to participate.  Ace wrap donned. Completed bed mobility with  independence with HOB elevated.  Sit<>stand with CG , uses rolling walker for support.  Pt only able to ambulate 15' with CG. Left knee unstable. Drags as she ambulates. Increased pain with mobility.  Seated EOB with all needs in reach.   P: Continue per PT Care Plan.   Harlin Rain, PTA, Lic # [id number]

## 2021-12-12 DIAGNOSIS — E669 Obesity, unspecified: Secondary | ICD-10-CM

## 2021-12-12 DIAGNOSIS — R443 Hallucinations, unspecified: Secondary | ICD-10-CM

## 2021-12-12 MED ORDER — NAPROXEN 250 MG PO TABS
500.0000 mg | ORAL_TABLET | Freq: Two times a day (BID) | ORAL | Status: DC | PRN
Start: 2021-12-12 — End: 2021-12-17
  Administered 2021-12-13 – 2021-12-17 (×5): 500 mg via ORAL
  Filled 2021-12-12 (×5): qty 2

## 2021-12-12 MED ORDER — QUETIAPINE FUMARATE 50 MG PO TABS
50.00 mg | ORAL_TABLET | Freq: Once | ORAL | Status: AC
Start: 2021-12-12 — End: 2021-12-12
  Administered 2021-12-12: 50 mg via ORAL
  Filled 2021-12-12: qty 1

## 2021-12-12 NOTE — Progress Notes (Addendum)
HOSPITALIST PROGRESS NOTE  12/12/2021    PATIENT INFO: Cheryl Zimmerman, [age] year old female  DATE OF ADMISSION: 12/05/21    ROOM/BED LOCATION:  610/610-A  HOSPITAL DAY: 4    INTERVAL EVENTS/ SUBJECTIVE:  No interval events.  Patient very drowsy today on exam.  Continues to report hallucinations of deceased family member to nursing.    OBJECTIVE:    VITAL SIGNS    12/12/21  0750 12/12/21  0941 12/12/21  1306 12/12/21  1343   BP: 121/85   109/72   Pulse:    88   Resp:  18 18 18    Temp:    97.8 F (36.6 C)   TempSrc:    Oral   SpO2:    93%   Weight:       Height:           INS/OUTS (PAST 24 HOURS)  I/O 24 Hrs:  In: 1200 [P.O.:1200]  Out: 1000 [Urine:1000]    PHYSICAL EXAM   General: Very drowsy on exam and declined physical exam    RECENT LABS  BMP:   Recent Labs     12/10/21  0710   NA 140   K 3.9   CL 99   CO2 30   BUN 28*   CREAT 1.0   GLUCOSER 149     Ca, Mg, Phos:   Recent Labs     12/10/21  0710   CA 9.8   MG 2.0   PHOS 4.3     CBC: No results for input(s): WBC, HCT, PLTA, RBC in the last 72 hours.  Coagulation Labs: No results for input(s): PT, INR in the last 72 hours.    Invalid input(s): PTT  LFTs: No results for input(s): AST, ALT, TBILI, DBILI, ALKPHOS in the last 72 hours.  Pancreatic Enzymes: No results for input(s): AMY, LIP in the last 72 hours.  Urinalysis:No results for input(s): UACOL, UACLA, UAGLU, UABIL, UAKET, SPEGRAVURINE, UAOCC, UAPH, UAPRO, UANIT, LEUKOCYTES in the last 72 hours.    Invalid input(s): UAOBU  Troponin: No results for input(s): TROPI in the last 72 hours.    MICROBIOLOGY REVIEW  No new microbiology results to review.    IMAGING AND OTHER STUDIES  No new imaging or other studies to review.    MEDICATIONS  See list in EPIC    ASSESSMENT & PLAN   Ms. Cheryl Zimmerman is a 51 year old morbidly obese, female with Schizoaffective disorder - bipolar type, multiple suicide attempts, housing instability, substance use disorder, PTSD, seizure disorder, complex pain syndrome on  suboxone, HFpEF, asthma/ COPD, who was admitted for suicidal ideation and acute on chronic HFpEF which improved with diuresis. Transferred to inpatient Psychiatry for suicidal ideation, however, sustained a mechanical fall and could not move around the unit in her bariatric wheelchair and transferred back to Medicine. Now with resolved SI and course c/b left lateral and medial meniscal tears and sprain. Medically cleared awaiting STR placement.    # Mechanical fall  # Left knee pain   # Left lateral and medial meniscal tears, on MRI  - Orthopedics consulted, appreciate recs. Outpatient follow-up on 12/14/2021, ACE wrap.   - Pain control: Tylenol 650mg  QID, Naproxen 500mg  BID, Ice packs,  Suboxone 4mg  PRN for breakthrough pain.  - PT consulted    # Complex chronic pain syndrome  # Lumbar spinal stenosis with sciatica  -Buprenorphine-naloxone 8-2TID - confirmed on PDMP (+ PRN suboxone for acute pain as above)  -Fluoxetine  20mg  daily  -Gabapentin 600mg  TID  -Methocarbamol 750mg  TID AC  - TI: Chronic pain clinic evaluation    #Suicidal ideation, resolved  #Hallucinations, persistent  #Schizoaffective disorder, bipolar type  #PTSD  #Anxiety  #Grief reaction  #History of suicide attempts  - SI resolved per psych eval, no need for 1:1 or to return to psych once medically optimized.  - Persistent visual hallucinations  - Current regimen:  Prazosin 3mg  nightly, Fluoxetine 20mg  daily, Lorazepam 1mg  q4h prn, Loxapine 30mg  BID, Quetiapine 50mg  nightly, Oxcarbazepine 150mg  BID  - Psych consulted, appreciate recs.  - For discharge: meeting 3/23, SW indicating pt to go to respite bed post STR- [facility] and [facility] will continue to provide support (see SW note 3/23)    #Substance use disorder with UDS + cocaine, fentanyl on 11/24/2021  #History of alcohol withdrawal, alcohol use disorder  #h/o housing instability  No current withdrawal  -Acamprosate  -Buprenorphine-naloxone for chronic pain as above  - SW consulted, appreciate  recs    Resolving issues:  #Acute HFpEF exacerbation (EF 70%, no regional WMA, TTE 12/07/2021)  #COPD/Asthma  #Obesity  #Obstructive Sleep Apnea  #Chronic dyspnea, orthopnea, edema  10/2021 nuclear perfusion test w/out evidence of ischemia, though EF noted to be 40%. Repeat ECHO this admission WNL EF 70%  Etiology of HF unclear (though possibly alcohol + cocaine use contributory?)  - Torsemide 100mg  BID  - Symbicort BID, Albuterol prn, Flonase BID  -Rescue albuterol as needed  - TI: Outpatient sleep study and Pulmonary follow-up/ PFTs    #Hypokalemia, resolved  Potassium3.2in the morning of 3/16. Magnesium was 2. 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     #Hypothyroidism:History of levothyroxine use.TSH normal on 11/24/2021.  -Levothyroxine    #Gastroesophageal reflux disease:  -Pantoprazole    Telemetry: No  Foley: No  FEN:   Orders Placed This Encounter      Diet - Base Diet: Modified/Restricted; Cardiac Restrictions: Heart Healthy (ALLOW CAFFEINE)  Prophylaxis: SQH BID    Medication Reconciliation: Done on admission  Changes from home medication regimen: as above    Code Status  Full Code    Health care proxy: None    PCP: None    Dispo: Inpatient pending     Level of care: Inpatient. Given the complexity of this patient's presentation, severity of illness, and chronic medical conditions, I anticipate that this patient will require an inpatient stay that is greater than two midnights.     Documentation of Time: I have spent >35 minutes (including floor/unit time) for this patient encounter of  which >50% were spent in patient/family counseling and care coordination.      Morley Kos, MD, MPH  Hospitalist  [phone]    =====  Addendum -- Event note:    # Agitation  # Confusion 2/2 Suspected hospital-acquired delirium  This afternoon called bedside to see patient - patient with agitation, throwing things and yelling racist slurs to staff. I discussed with her that we cannot say racist slurs in the hospital.  Believes that the nurses are laughing at her  and that she has been moved to a different unit.  Reassured her that she is still on [facility] medical unit.  When asked if she is still having hallucinations (of her deceased son), she begins to sob. She requested to speak to the Licensed conveyancer as well.  Provided an ice pack for the left knee.  Given seroquel 50mg  x1 at 5pm for anxiety per patient request.

## 2021-12-12 NOTE — Plan of Care (Addendum)
A&Ox3, denies SOB/CP/dizziness/n/v. Continue to endorse hallucinations. VSS, afebrile. Up to chair for meals. Fall precautions in place. Able to make needs known, call light placed within reach. Will continue to monitor.    14.30 pt began shouting and swearing. When attempting to assist, pt would either yell "theres too many people in here", or "nobody has come into my room all day". Instructed her to not raise her voice or swear while in the hospital. A little while later she was yelling again, while walking into room, pt threw a cup of water across the room onto the wall, and being very verbally abuse to staff members, using foul language. Ativan given with no affect. This behavior continued throughout shift.   16.00 MD notified. Arrived at bedside for assessment, unable to deescalate pt.   16.40 [facility] paged. Arrived at bedside and sat w/ pt, successfully deescalating her.  17.00 one time dose of Seroquel administered w/ good affect.   SERS filed.   *eyeglasses found on ground amongst objects thrown around room during pts agitated state, noted to have one side of the frame missing. This RN searched room for missing part. Pt states "it's ok, they came like that".        Problem: Activity:  Goal: Mobility will be supported throughout the hospitalization,  Outcome: Progressing     Problem: Cognitive:  Goal: Caregivers and patients knowledge of risk factors and measures for prevention of condition will be supported throughout the hospitalization  Outcome: Progressing     Problem: Safety:  Goal: Will remain free from falls throughout the hospitalization,  Outcome: Progressing     Problem: Daily Care  Goal: Daily care needs are met  Description: Assess and monitor ability to perform self care and identify potential discharge needs.  Outcome: Progressing     Problem: Pain  Goal: Patient's pain/discomfort is manageable  Description: Assess and monitor patient's pain using appropriate pain scale. Collaborate with  interdisciplinary team and initiate plan and interventions as ordered. Re-assess patient's pain level 30 - 60 minutes after pain management intervention.   Outcome: Progressing

## 2021-12-12 NOTE — Plan of Care (Signed)
Alert and oriented, able to make needs known. Tearful at start of shift having bad dreams, given prn ativan with good effect. C/o gowns being too small able to find plus size gowns patient became very happy. Left knee pain managed with scheduled pain medication. Purewick in place also uses commode. Fall precautions in place, bed alarm on, call light in reach.    Problem: Safety:  Goal: Will remain free from falls throughout the hospitalization,  Outcome: Progressing

## 2021-12-13 NOTE — Progress Notes (Signed)
SOCIAL WORK - REASSESSMENT       Case Reviewed with: Patient and Provider     Current Level Of Care: Medically clear    Insurance Concerns: N/A    Initial Education officer, museum Request: SUD      Change In Status From Initial Assessment:     New Information That Will Affect Transition Plan: SW and RC met with pt as a follow up. Per RC     Krysti was upstairs in psych for a little and transferred back down to 6. She was very upset about how much pain she was in and told me the tylenol was not working. She mentioned she doesn't know if she will be going to respite or not and I don't think she is appropriate for CSS at this moment. She needs to take care of her health and knee pain first. She will be here a bit longer to determine her next step. I provided her [facility] resources sheet and our hotline number.  Resources/Support       Anticipated D/C Plan: to STR pending bed offer    Per Patient Choice, Referrals Made To: Refer to RN CM notes for further details.     Barriers To Plan: STR bed offer    Patient/Responsible Party Understanding of Final Plan: Yes, Able to repeat back plan with understanding

## 2021-12-13 NOTE — Progress Notes (Signed)
PROGRESS NOTE  12/13/2021    PATIENT INFO: Cheryl Zimmerman, 33nglish-SPEAKING 51 year old female  DATE OF ADMISSION: 12/05/21    ROOM/BED LOCATION:  610/610-A  HOSPITAL DAY: 5    INTERVAL EVENTS  From 3/27 note:  "# Agitation  # Confusion 2/2 Suspected hospital-acquired delirium  This afternoon called bedside to see patient - patient with agitation, throwing things and yelling racist slurs to staff. I discussed with her that we cannot say racist slurs in the hospital.  Believes that the nurses are laughing at her and that she has been moved to a different unit.  Reassured her that she is still on 6N medical unit.  When asked if she is still having hallucinations (of her deceased son), she begins to sob. She requested to speak to the Consulting civil engineerN manager as well.  Provided an ice pack for the left knee.  Given seroquel 50mg  x1 at 5pm for anxiety per patient request."      SUBJECTIVE:  This AM very frustrated- would like to leave as feels as though we are ignoring her pain  Nursing staff expressing concerns over pt's use of racial slurs directed at staff   I discussed with pt, pt not showing remorse initially in AM  Later, in discussion with patient in afternoon, pt showing remorse, says wasn't acting like herself, is okay to stay in hospital, feels more relaxed.  Still with some knee pain but overall feels is better- chronic pain is uncontrolled but perhaps at/near baseline    OBJECTIVE:    VITAL SIGNS      12/12/21  2030 12/12/21  2043 12/13/21  0519 12/13/21  0728   BP:  135/89 112/74    Pulse:  90 87 88   Resp: 20 18     Temp:  98.2 F (36.8 C) 98 F (36.7 C)    TempSrc:  Oral Oral    SpO2:  90% 92%    Weight:   (!) 165.8 kg (365 lb 9.6 oz)    Height:           INS/OUTS (PAST 24 HOURS)  I/O 24 Hrs:  In: 320 [P.O.:320]  Out: 2300 [Urine:2300]    PHYSICAL EXAM   GEN:          Uncomfortable, frustrated, no acute distress  CV:            deferred  PULM:       deferred  ABD:          deferred  EXT:          Pitting edema 2+ to  mid-shin b/l  MSK:                  Mild swelling L knee, moderately TTP most notable at anteromedial joint space,  ROM limited to <45 deg. Due to pain, not erythematous, no fluctuance  SKIN:         No visible rash  NEURO:    Alert and oriented- neuro exam limited by habitus and pain, though grossly normal sensation lower extremities b/l  RECENT LABS  BMP:   No results for input(s): NA, K, CL, CO2, BUN, CREAT, GLUCOSER in the last 72 hours.  Ca, Mg, Phos:   No results for input(s): CA, MG, PHOS in the last 72 hours.  CBC: No results for input(s): WBC, HCT, PLTA, RBC in the last 72 hours.  Coagulation Labs: No results for input(s): PT, INR in the last 72 hours.  Invalid input(s): PTT  LFTs: No results for input(s): AST, ALT, TBILI, DBILI, ALKPHOS in the last 72 hours.  Pancreatic Enzymes: No results for input(s): AMY, LIP in the last 72 hours.  Urinalysis:No results for input(s): UACOL, UACLA, UAGLU, UABIL, UAKET, SPEGRAVURINE, UAOCC, UAPH, UAPRO, UANIT, LEUKOCYTES in the last 72 hours.    Invalid input(s): UAOBU  Troponin: No results for input(s): TROPI in the last 72 hours.    MICROBIOLOGY REVIEW  No new microbiology results to review.    IMAGING AND OTHER STUDIES  No new imaging or other studies to review.    MEDICATIONS  See list in EPIC    ASSESSMENT & PLAN   Ms. 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 Transferred to inpatient Psychiatry for suicidal ideation, however, sustained a mechanical fall and could not move around the unit in her bariatric wheelchair and transferred back to Medicine. Now with resolved SI and course c/b left lateral and medial meniscal tears and sprain. Medically cleared awaiting  STR placement.    # Mechanical fall  # Left knee pain   # Left lateral and medial meniscal tears, on MRI  - Orthopedics consulted, appreciate recs. Outpatient follow-up on 12/14/2021 (ortho to reschedule), ACE wrap. More supportive brace per STR or ortho clinic  (no size available here)  - Pain control: Tylenol 650mg  QID, Naproxen 500mg  BID, Ice packs,  Suboxone 4mg  PRN for breakthrough pain.  - PT consulted    # Complex chronic pain syndrome  # Lumbar spinal stenosis with sciatica  -Buprenorphine-naloxone 8-2TID - confirmed on PDMP (+ PRN suboxone for acute pain as above)  -Fluoxetine 20mg  daily  -Gabapentin 600mg  TID  -Methocarbamol 750mg  TID AC  - TI: Chronic pain clinic evaluation    #Agitation  #Suicidal ideation, resolved  #Hallucinations, persistent  #Schizoaffective disorder, bipolar type  #PTSD  #Anxiety  #Grief reaction  #History of suicide attempts  - SI resolved per psych eval, no need for 1:1 or to return to psych once medically optimized.  - Persistent visual hallucinations  - 3/26 pt agitated, throwing objects, using racial slurs. Suspect this reflects further psychiatric decompensation iso isolation/lack of autonomy, pain, immobility, etc; pt recovered from mood standpoint by the time of psychiatric assessment 3/27PM- will continue to monitor  - Current regimen:  Prazosin 3mg  nightly, Fluoxetine 20mg  daily, Lorazepam 1mg  q4h prn, Loxapine 30mg  BID, Quetiapine 50mg  nightly, Oxcarbazepine 150mg  BID  - Psych consulted, appreciate recs.  - For discharge: meeting 3/23, SW indicating pt to go to respite bed post STR- [facility] and [facility] will continue to provide support (see SW note 3/23)    #Substance use disorder with UDS + cocaine, fentanyl on 11/24/2021  #History of alcohol withdrawal, alcohol use disorder  #h/o housing instability  No current withdrawal  -Acamprosate  -Buprenorphine-naloxone for chronic pain as above  - SW consulted, appreciate recs    Resolving issues:  #Acute HFpEF exacerbation (EF  70%, no regional WMA, TTE 12/07/2021)  #COPD/Asthma  #Obesity  #Obstructive Sleep Apnea  #Chronic dyspnea, orthopnea, edema  10/2021 nuclear perfusion test w/out evidence of ischemia, though EF noted to be 40%. Repeat ECHO this admission WNL EF 70%  Etiology of HF unclear (though possibly alcohol + cocaine use contributory?)  - Torsemide  100mg  BID  - Symbicort BID, Albuterol prn, Flonase BID  -Rescue albuterol as needed  - TI: Outpatient sleep study and Pulmonary follow-up/ PFTs    #Hypokalemia, resolved  Potassium3.2in the morning of 3/16. Magnesium was 2. 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     #Hypothyroidism:History of levothyroxine use.TSH normal on 11/24/2021.  -Levothyroxine    #Gastroesophageal reflux disease:  -Pantoprazole    Telemetry: No  Foley: No  FEN:   Orders Placed This Encounter      Diet - Base Diet: Modified/Restricted; Cardiac Restrictions: Heart Healthy (ALLOW CAFFEINE)  Prophylaxis: SQH BID    Medication Reconciliation: Done on admission  Changes from home medication regimen: as above    Code Status  Full Code    Health care proxy: None    PCP: None    Dispo: Inpatient pending Rehab acceptance

## 2021-12-13 NOTE — Plan of Care (Signed)
Problem: Activity:  Goal: Mobility will be supported throughout the hospitalization,  12/13/2021 1704 by Joellyn Quails, RN  Outcome: Progressing  12/13/2021 1703 by Joellyn Quails, RN  Outcome: Progressing  Intervention: Assess ambulation throughout the hospitalization, and per protocol  12/13/2021 1704 by Joellyn Quails, RN  Note: Pt OOB to chair most of the shift  Pt showered with assistance tolerated well  Pt ambulated in hall with walker with motivation and encouragement       Problem: Pain  Goal: Patient's pain/discomfort is manageable  Description: Assess and monitor patient's pain using appropriate pain scale. Collaborate with interdisciplinary team and initiate plan and interventions as ordered. Re-assess patient's pain level 30 - 60 minutes after pain management intervention.   12/13/2021 1704 by Joellyn Quails, RN  Outcome: Progressing  Intervention: Assess patient's pain level  12/13/2021 1704 by Joellyn Quails, RN  Note: Pt has pain in left knee   Pt c/o pain #8 out of #10  Pt medicated with scheduled Meds (see MAR)  Also ice to knee and ace wrap to knee prn

## 2021-12-13 NOTE — Progress Notes (Signed)
Inpatient Physical Therapy Treatment Note  S: "If I feel like it"  O: Please see Vital Sign and Pain Assessment flowsheets for vitals and pain documentation.  Plan of care has been reviewed and updated as necessary.     12/13/21 0925   Language Information   Language of Care English   Rehab Discipline   Rehab Discipline PT   Weight Bearing Status   RLE FWB   LLE FWB   RUE FWB   LUE FWB   Mobility / Balance   Mobility / Balance Yes   Transfers   Transfer Yes   Transfer 1   Transfer From 1 Bed   Transfer Type 1 To and from   Transfer to 1 Stand   Technique 1 Sit to stand;Stand to sit   Transfer Device 1 Rolling walker (Front wheel walker)   Transfer Level of Assistance 1 Close supervision   Trials/Comments 1 x1   Transfers 2   Transfer From 2 Stand   Transfer Type 2 To and from   Transfer to 2 Wheelchair - manual   Technique 2 Stand to sit;Sit to stand   Transfer Device 2 Rolling walker [employer] wheel walker)   Transfer Level of Assistance 2 Contact guard;Close supervision   Trials/Comments 2 x1   Gait   Gait Yes   Gait 1   Assistive Device 1 Rolling walker (Front wheel walker)   Pattern 1 L Foot drag;L Decreased stance time   Gait Assistance 1 Contact guard   Distance (Ft) 1 30 Feet  (x2)   Safety Devices   Type of Devices Call bell in place   Plan   Prognosis Fair   PT Frequency 5x/wk   Recommendation   Recommendation Short-term skilled PT   Equipment Recommended Rolling walker (Front wheel walker)     A: Received seated EOB, agreeable to treatment. Improved ambulation distances noted this date, however with notable left foot drag throughout. Also able to improve weightbearing through LLE this date. Left seated EOB with all needs in reach.   P: Continue per PT Care Plan. Recommend STR upon d/c.       Jazzmon Prindle C. Fransisca Connors, PT, Lic # [id number]

## 2021-12-13 NOTE — Consults (Signed)
I met with pt this afternoon and she stetad "my spirits are good and I am not suicidal." she notes that yesterday she had a moment of confusion about where she and indicates she was yelling and felt remorse about this. She indicates that she has some confusion about whether surgery is being proposed for the 28th.   Overall pt appears to not have a psychiatric decompensation that would requrie an inpatient psych stay.   She does have a plan to go to inpatient rehab as I have understood it. She is agreeable to do this if they find one for her.   Pt does have a desire to speak with med/surg teams to better understands the plan.

## 2021-12-13 NOTE — Plan of Care (Signed)
Problem: Safety:  Goal: Will remain free from falls throughout the hospitalization,  Outcome: Progressing   Continues on fall precautions, bed alarm on, call light in reach. PPB woth walker to commode x 2 assist.    Problem: Pain  Goal: Patient's pain/discomfort is manageable  Description: Assess and monitor patient's pain using appropriate pain scale. Collaborate with interdisciplinary team and initiate plan and interventions as ordered. Re-assess patient's pain level 30 - 60 minutes after pain management intervention.   Outcome: Progressing     Right knee pain managed with scheduled tylenol. Lidocaine patch and medicated gel with good effect.

## 2021-12-13 NOTE — Plan of Care (Signed)
Problem: Safety:  Goal: Will remain free from falls throughout the hospitalization,  Outcome: Progressing   Rec'd patient in bed. Call light is within reach and is cooperative about calling for assistance before getting OOB to commode or another other OOB activity. Will either use call light or yell out for assistance. Will continue to monitor.

## 2021-12-13 NOTE — Progress Notes (Signed)
Pt remains medically clear, no bed offer yet. Variance days updated.

## 2021-12-14 ENCOUNTER — Ambulatory Visit (HOSPITAL_BASED_OUTPATIENT_CLINIC_OR_DEPARTMENT_OTHER): Payer: No Typology Code available for payment source | Admitting: Orthopaedic Surgery

## 2021-12-14 DIAGNOSIS — R451 Restlessness and agitation: Secondary | ICD-10-CM

## 2021-12-14 DIAGNOSIS — F4321 Adjustment disorder with depressed mood: Secondary | ICD-10-CM

## 2021-12-14 DIAGNOSIS — G4733 Obstructive sleep apnea (adult) (pediatric): Secondary | ICD-10-CM

## 2021-12-14 DIAGNOSIS — E785 Hyperlipidemia, unspecified: Secondary | ICD-10-CM

## 2021-12-14 DIAGNOSIS — I5031 Acute diastolic (congestive) heart failure: Secondary | ICD-10-CM

## 2021-12-14 MED ORDER — LOXAPINE SUCCINATE 5 MG PO CAPS
35.0000 mg | ORAL_CAPSULE | Freq: Two times a day (BID) | ORAL | Status: DC
Start: 2021-12-14 — End: 2021-12-17
  Administered 2021-12-14 – 2021-12-17 (×6): 35 mg via ORAL
  Filled 2021-12-14 (×6): qty 2

## 2021-12-14 NOTE — Progress Notes (Signed)
PROGRESS NOTE  12/14/2021    PATIENT INFO: Cheryl Zimmerman, [age] year old female  DATE OF ADMISSION: 12/05/21    ROOM/BED LOCATION:  610/610-A  HOSPITAL DAY: 6    INTERVAL EVENTS  None    SUBJECTIVE:  This AM again expressing remorse from yesterday's events, feeling comfortable about staying here until able to find safe place for discharge    Still with some knee pain but overall feels is better- chronic pain is uncontrolled but perhaps at/near baseline    OBJECTIVE:    VITAL SIGNS      12/14/21  1401 12/14/21  1647 12/14/21  1749 12/14/21  1750   BP: 114/76 114/76 (!) 143/110    Pulse: 98  104 94   Resp: 20      Temp: 97.5 F (36.4 C)      TempSrc: Oral      SpO2: 94%  (!) 89% 94%   Weight:       Height:           INS/OUTS (PAST 24 HOURS)  I/O 24 Hrs:  In: 480 [P.O.:480]  Out: 2500 [Urine:2500]    PHYSICAL EXAM   GEN:          Uncomfortable, frustrated, no acute distress  CV:            WNL  PULM:       CTAB, decreased BS  ABD:          soft, NTND  EXT:          Pitting edema 2+ to mid-shin b/l  MSK:                  Mild swelling L knee, moderately TTP most notable at anteromedial joint space,  ROM limited to <45 deg. Due to pain, not erythematous, no fluctuance  SKIN:         No visible rash  NEURO:    Alert and oriented- neuro exam limited by habitus and pain, though grossly normal sensation lower extremities b/l  RECENT LABS  BMP:   No results for input(s): NA, K, CL, CO2, BUN, CREAT, GLUCOSER in the last 72 hours.  Ca, Mg, Phos:   No results for input(s): CA, MG, PHOS in the last 72 hours.  CBC: No results for input(s): WBC, HCT, PLTA, RBC in the last 72 hours.  Coagulation Labs: No results for input(s): PT, INR in the last 72 hours.    Invalid input(s): PTT  LFTs: No results for input(s): AST, ALT, TBILI, DBILI, ALKPHOS in the last 72 hours.  Pancreatic Enzymes: No results for input(s): AMY, LIP in the last 72 hours.  Urinalysis:No results for input(s): UACOL, UACLA, UAGLU, UABIL, UAKET,  SPEGRAVURINE, UAOCC, UAPH, UAPRO, UANIT, LEUKOCYTES in the last 72 hours.    Invalid input(s): UAOBU  Troponin: No results for input(s): TROPI in the last 72 hours.    MICROBIOLOGY REVIEW  No new microbiology results to review.    IMAGING AND OTHER STUDIES  No new imaging or other studies to review.    MEDICATIONS  See list in EPIC    ASSESSMENT & PLAN   Ms. 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 Transferred to inpatient Psychiatry for suicidal ideation,  however, sustained a mechanical fall and could not move around the unit in her bariatric wheelchair and transferred back to Medicine. Now with resolved SI and course c/b left lateral and medial meniscal tears and sprain. Medically cleared awaiting STR placement.    # Mechanical fall  # Left knee pain   # Left lateral and medial meniscal tears, on MRI  - Orthopedics receommednd no intervention at this time as tears appear chronic on imaging and would not benefit from brace as is mobilizing knee well and is stable on feet. Outpatient follow-up on 12/14/2021 cancelled as remains inpatient; ortho rescheduled for 12/21/2021  - ACE wrap.  - Pain control: Tylenol 650mg  QID, Naproxen 500mg  BID, Ice packs,  Suboxone 4mg  PRN for breakthrough pain.  - PT consulted    # Complex chronic pain syndrome  # Lumbar spinal stenosis with sciatica  -Buprenorphine-naloxone 8-2TID - confirmed on PDMP (+ PRN suboxone for acute pain as above)  -Fluoxetine 20mg  daily  -Gabapentin 600mg  TID  -Methocarbamol 750mg  TID AC  - TI: Chronic pain clinic evaluation    #Agitation  #Suicidal ideation, resolved  #Hallucinations, persistent  #Schizoaffective disorder, bipolar  type  #PTSD  #Anxiety  #Grief reaction  #History of suicide attempts  - SI resolved per psych eval, no need for 1:1 or to return to psych once medically optimized.  - Persistent visual hallucinations  - 3/26 pt agitated, throwing objects, using racial slurs. Suspect this reflects further psychiatric decompensation iso isolation/lack of autonomy, pain, immobility, etc; pt recovered from mood standpoint by the time of psychiatric assessment 3/27PM- will continue to monitor  - Current regimen:  Prazosin 3mg  nightly, Fluoxetine 20mg  daily, Lorazepam 1mg  q4h prn, Loxapine 35mg  BID (increased from 30 BID today, 3/28), Quetiapine 50mg  nightly, Oxcarbazepine 150mg  BID  - Psych consulted, appreciate recs.  - For discharge: meeting 3/23, SW indicating pt to go to respite bed post STR- [facility] and [facility] will continue to provide support (see SW note 3/23)    #Substance use disorder with UDS + cocaine, fentanyl on 11/24/2021  #History of alcohol withdrawal, alcohol use disorder  #h/o housing instability  No current withdrawal  -Acamprosate  -Buprenorphine-naloxone for chronic pain as above  - SW consulted, appreciate recs    Resolving issues:  #Acute HFpEF exacerbation (EF 70%, no regional WMA, TTE 12/07/2021)  #COPD/Asthma  #Obesity  #Obstructive Sleep Apnea  #Chronic dyspnea, orthopnea, edema  10/2021 nuclear perfusion test w/out evidence of ischemia, though EF noted to be 40%. Repeat ECHO this admission WNL EF 70%  Etiology of HF unclear (though possibly alcohol + cocaine use contributory?)  - Torsemide 100mg  BID  - Symbicort BID, Albuterol prn, Flonase BID  -Rescue albuterol as needed  - TI: Outpatient sleep study and Pulmonary follow-up/ PFTs    #Hypokalemia, resolved  Potassium3.2in the morning of 3/16. Magnesium was 2. 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     #Hypothyroidism:History of levothyroxine use.TSH normal on 11/24/2021.  -Levothyroxine    #Gastroesophageal reflux disease:  -Pantoprazole    Telemetry: No  Foley: No  FEN:  Orders Placed This Encounter      Diet - Base Diet: Modified/Restricted; Cardiac Restrictions: Heart Healthy (ALLOW CAFFEINE)  Prophylaxis: SQH BID    Medication Reconciliation: Done on admission  Changes from home medication regimen: as above    Code Status  Full Code    Health care proxy: None    PCP: None    Dispo: Inpatient pending Rehab acceptance

## 2021-12-14 NOTE — Plan of Care (Signed)
Problem: Activity:  Goal: Mobility will be supported throughout the hospitalization,  Outcome: Progressing  Intervention: Assess ambulation throughout the hospitalization, and per protocol  Note: Pt sitting at the edge of bed most of shift, occ sitting in the chair  Pt did ambulate in the hall with the walker, SOB while ambulating and requested O2 , recovered from the SOB after sitting for a few minutes  Intervention: Monitor for patient assistive devices throughout the hospitalization, and based upon fall risk prevention bundle  Note: Pt using the call light or calling out for assistance  Pt understands not to get up by herself and does understand the importance of having someone assist her OOB to the commode or chair     Problem: Daily Care  Goal: Daily care needs are met  Description: Assess and monitor ability to perform self care and identify potential discharge needs.  Outcome: Progressing  Intervention: Assess ability to perform self care  Note: Pt able to wash self except her back and feet, which I assisted her with        Problem: Compromised Skin Integrity  Description: Use this problem when pressure ulcers are classified as stage I or II.  Goal: Skin integrity is maintained or improved  Description: Assess and monitor skin integrity. Identify patients at risk for skin breakdown on admission and per policy. Collaborate with interdisciplinary team and initiate plans and interventions as needed.  Outcome: Progressing  Intervention: Assess for risk of skin breakdown  Note: Skin intact, skin pink under abdominal fold and under her breasts, areas washed and Nystatin powder applied
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
## 2021-12-14 NOTE — RN Shift Note (Addendum)
Pt found ambulating with the walker in front of the nurses station.  Pt alert and oriented and very SOB.  Pt states she felt weak and might fall.  Sat patient in chair in hall and gave her some O2.  After a few minutes pt able to walk 1/2 way to room then we needed to push her in the chair the rest of the way.  Pt back to bed with O2 on 2L n/c and bed alarm in place  HR 96 BP 143/110  O2 sat on RA 89% on 2L N/C 94%.   Pt states she doesn't "feel well" MD notified.     Pt needed to void and requested the pure wick.  pure wick placed and urine output good.

## 2021-12-14 NOTE — Progress Notes (Signed)
S: I'm having a bad day. Support given.   O: Please see Vital Sign and Pain Assessment flowsheets for vitals and pain documentation.  Plan of care has been reviewed.   12/14/21 1142   Language Information   Language of Care English   Rehab Discipline   Rehab Discipline PT   Weight Bearing Status   RLE FWB   LLE FWB   RUE FWB   LUE FWB   Mobility / Balance   Mobility / Balance Yes   Transfers   Transfer Yes   Transfer 1   Transfer From 1 Bed   Transfer Type 1 To and from   Transfer to 1 Stand   Technique 1 Sit to stand;Stand to sit   Transfer Device 1 Rolling walker (Front wheel walker)   Transfer Level of Assistance 1 Close supervision   Gait   Gait Yes   Gait 1   Assistive Device 1 Rolling walker (Front wheel walker)   Pattern 1 L Foot drag;L Decreased stance time   Gait Assistance 1 Contact guard   Distance (Ft) 1 50 Feet   Balance   Sitting - Static Sits without support for > 30 sec;Feet supported   Sitting - Dynamic Moves/returns truncal midpoint 1-2 inches in multiple planes;Feet supported   Standing - Static Bilateral upper extremity supported   Standing - Dynamic Lateral lean;Forward lean   Posture Rounded shoulders;Forward head;Kyphosis   International aid/development worker Devices   Type of Devices Call bell in place   Plan   Prognosis Fair   PT Frequency 5x/wk   Recommendation   Recommendation Short-term skilled PT   Equipment Recommended Rolling walker (Front wheel walker)       A:  Pt seated EOB on arrival.  Ace wrapped donned.  Using walker for sit<>stand transfer.  Increased ambulation today. 50' with CG, Less LLE drag.  L knee clicking heard during gait.  Seated EOB all needs in reach.   P: Continue per PT Care Plan.   Harlin Rain, PTA, Lic # [id number]

## 2021-12-14 NOTE — Progress Notes (Addendum)
Discussed pt during MDR. Reviewed MRI imaging with Dr. Haskell Riling, these meniscal tears appear chronic, not acute and does not seem to greatly interfere with pt's mobility/ambulation. She does not report any focal source of pain but instead points to her entire LLE. Saw pt ambulating in the hall with walker, she appears steady on her feet.     No indication for elective surgery at this time as her injury appears chronic in nature with degenerative changes and does not seem to restrict her mobility, we can continue to follow her as OP as the original plan. She does not need any KI or wrapping as she appears to ambulate fine without one and this would just make her more stiff.     She is currently awaiting STR bed placement. If any changes, we are happy to assess her once again but will S/O at this time. Touched base with medicine regarding this.

## 2021-12-15 MED ORDER — HALOPERIDOL LACTATE 5 MG/ML IJ SOLN
5.0000 mg | Freq: Once | INTRAMUSCULAR | Status: DC
Start: 2021-12-15 — End: 2021-12-15
  Filled 2021-12-15: qty 1
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
## 2021-12-15 NOTE — Plan of Care (Signed)
Problem: Activity:  Goal: Mobility will be supported throughout the hospitalization,  Outcome: Progressing     Problem: Safety:  Goal: Will remain free from falls throughout the hospitalization,  Outcome: Progressing     Problem: Pain  Goal: Patient's pain/discomfort is manageable  Description: Assess and monitor patient's pain using appropriate pain scale. Collaborate with interdisciplinary team and initiate plan and interventions as ordered. Re-assess patient's pain level 30 - 60 minutes after pain management intervention.   Outcome: Progressing     Pt alert and oriented x 4. Vitals stable on RA, prn oxygen for comfort. Pt a high fall risk, bed alarm on at all times. SBA out of bed with walker, only pivoted to the commode this shift. Pt reporting 10/10 chronic pain in knees and back, medicated appropriately per mar. Pt can become anxious, easily redirected and prn anxiety meds administered per mar. Able to make needs known. Fall precautions in place. Call bell within reach.
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
## 2021-12-15 NOTE — Progress Notes (Signed)
Pt discussed during MDR, SW sent intake form to [facility] discharge support to assist in finding placement. SW will remain available.

## 2021-12-15 NOTE — Plan of Care (Signed)
Problem: Activity:  Goal: Mobility will be supported throughout the hospitalization,  Outcome: Progressing     Problem: Safety:  Goal: Will remain free from falls throughout the hospitalization,  Outcome: Progressing     Problem: Pain  Goal: Patient's pain/discomfort is manageable  Description: Assess and monitor patient's pain using appropriate pain scale. Collaborate with interdisciplinary team and initiate plan and interventions as ordered. Re-assess patient's pain level 30 - 60 minutes after pain management intervention.   Outcome: Progressing    Pt is A/O X 4 and is ambulating with stand by assist with a walker. She is tolerating her current diet and ambulating to the commode and the bathroom and is voiding clear, yellow  urine. Pt agitated and physically aggressive, but deescalated by medical team. Pt complained of pain of the right knee and medications were administered with good effect. Held afternoon meds per MD orders, pt with increasing lethargy, vital signs stable. Bed alarm on, with call light within reach, will continue to monitor.

## 2021-12-15 NOTE — Progress Notes (Signed)
S: I am tired.   O: Please see Vital Sign and Pain Assessment flowsheets for vitals and pain documentation.  Plan of care has been reviewed.   12/15/21 1056   Language Information   Language of Care English   Rehab Discipline   Rehab Discipline PT   Weight Bearing Status   RLE FWB   LLE FWB   RUE FWB   LUE FWB   Mobility / Balance   Mobility / Balance Yes   Bed Mobility   Rolling Independent   Supine to Sit Independent   Transfers   Transfer Yes   Transfer 1   Transfer From 1 Bed   Transfer Type 1 To and from   Transfer to 1 Stand   Technique 1 Sit to stand;Stand to sit   Transfer Device 1 Rolling walker (Front wheel walker)   Transfer Level of Assistance 1 Supervision   Gait   Gait Yes   Gait 1   Assistive Device 1 Rolling walker (Front wheel walker)   Pattern 1 Decreased stride length;L Foot drag   Gait Assistance 1 Close supervision   Distance (Ft) 1 60 Feet   Balance   Sitting - Static Sits without support for > 30 sec;Feet supported   Sitting - Dynamic Moves/returns truncal midpoint 1-2 inches in multiple planes;Feet supported   Standing - Static Bilateral upper extremity supported   Standing - Dynamic Lateral lean;Forward lean   Posture Rounded shoulders;Forward head;Kyphosis   Ther Exercise   Therapeutic Exercise? No   Coordination   Gross Naval architect Devices   Type of Devices Call bell in place   Plan   Prognosis Fair   PT Frequency 5x/wk   Recommendation   Recommendation Short-term skilled PT   Equipment Recommended Rolling walker (Front wheel walker)      Ace bandage donned  for additional support while ambulating. Removed when placed back to bed.   A:  Pt progressing toward PT goals.  Sit<>stand with rolling walker  with supervision. Increased ambulation today. 4' X1 with 1 seated rest.  Uses rolling walker with Close supervision.  Decreased LLE advancement.  Denied knee pain.   In bed, all needs in reach.   P: Continue per PT Care Plan.   Harlin Rain, PTA, Lic #  [id number]

## 2021-12-15 NOTE — Case Mgmt Note (Signed)
Pt discussed in MDR. Pt continues to be medically cleared for DC to STR. No bed offer yet. Referrals expanded. [facility] days.    [facility]- message left for admissions  [facility]- message left for admissions  [facility] message left for admissions  [facility]- message left for admissions  [facility]- pending, no bed  [facility]- pending, no bed  [address]- pending  [facility]- pending  [facility]- pending  [facility]- pending  [facility]- admissions [facility]) will expand to other [place] SNFs except for [facility] and [facility]- declined  [place]- declined  [address]- declined  [facility] [place] [facility]- declined  [facility]- declined  [place]- declined  [facility]- declined  [place]-declined

## 2021-12-16 LAB — BASIC METABOLIC PANEL
ANION GAP: 13 mmol/L (ref 10–22)
BUN (UREA NITROGEN): 20 mg/dL — ABNORMAL HIGH (ref 7–18)
CALCIUM: 9.9 mg/dL (ref 8.5–10.5)
CARBON DIOXIDE: 33 mmol/L — ABNORMAL HIGH (ref 21–32)
CHLORIDE: 98 mmol/L (ref 98–107)
CREATININE: 0.8 mg/dL (ref 0.4–1.2)
ESTIMATED GLOMERULAR FILT RATE: >60 mL/min (ref >60)
Glucose Random: 135 mg/dL (ref 74–160)
POTASSIUM: 3.7 mmol/L (ref 3.5–5.1)
SODIUM: 143 mmol/L (ref 136–145)

## 2021-12-16 LAB — HOLD PURPLE TOP TUBE

## 2021-12-16 MED ORDER — COVID-19MRNA BIVAL VAC MODERNA 50 MCG/0.5ML IM SUSP
0.5000 mL | Freq: Once | INTRAMUSCULAR | Status: DC
Start: 2021-12-17 — End: 2021-12-17
  Filled 2021-12-16: qty 0.5

## 2021-12-16 NOTE — Plan of Care (Signed)
Problem: Cognitive:  Goal: Caregivers and patients knowledge of risk factors and measures for prevention of condition will be supported throughout the hospitalization  Outcome: Progressing     Problem: Safety:  Goal: Will remain free from falls throughout the hospitalization,  Outcome: Progressing     Problem: Pain  Goal: Patient's pain/discomfort is manageable  Description: Assess and monitor patient's pain using appropriate pain scale. Collaborate with interdisciplinary team and initiate plan and interventions as ordered. Re-assess patient's pain level 30 - 60 minutes after pain management intervention.   Outcome: Progressing     Pt alert and oriented x 4. Vitals stable on RA, prn oxygen for comfort. Pt a high fall risk, bed alarm on at all times. SBA out of bed with walker, only pivoted to the commode this shift. Pt reporting 10/10 chronic pain in knees and back, medicated appropriately per mar. Pt can become anxious, easily redirected and prn anxiety meds administered per mar. Able to make needs known. Fall precautions in place. Call bell within reach

## 2021-12-16 NOTE — Progress Notes (Signed)
SOCIAL WORK - REASSESSMENT       Case Reviewed with: Patient and Provider    Current Level Of Care: Medically clear    Insurance Concerns: N/A    Initial Child psychotherapist Request: AUD  and SUD      Change In Status From Initial Assessment: N/A    New Information That Will Affect Transition Plan: Pt discussed during MDR, SW sent message to pt's group home re: respite placement from hospital. SW awaiting to hear back. SW updated pt re: the above.     Anticipated D/C Plan: Respite or STR    Per Patient Choice, Referrals Made To: Refer to RN CM notes    Barriers To Plan: Awaiting STR or respite    Patient/Responsible Party Understanding of Final Plan: Yes, Able to repeat back plan with understanding

## 2021-12-16 NOTE — Case Mgmt Note (Signed)
Pt discussed in MDR. Nursing reporting that pt has ambulated in the hallway independently multiple times. SW and CM have reached out to group home regarding respite placement and provided medical update. Awaiting reply. Variance days. Will cont to follow.
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
## 2021-12-16 NOTE — Progress Notes (Signed)
Inpatient Physical Therapy Treatment Note  S: "I have a lot of pain and swelling"  O: Please see Vital Sign and Pain Assessment flowsheets for vitals and pain documentation.  Plan of care has been reviewed and updated as necessary.     12/16/21 1339   Language Information   Language of Care English   Rehab Discipline   Rehab Discipline PT   Mobility / Balance   Mobility / Balance Yes   Bed Mobility   Rolling Independent   Supine to Sit Independent   Transfers   Transfer Yes   Transfer 1   Transfer From 1 Bed   Transfer Type 1 To and from   Transfer to 1 Stand   Technique 1 Sit to stand;Stand to sit   Transfer Device 1 Rolling walker (Front wheel walker)   Transfer Level of Assistance 1 Independent   Trials/Comments 1 x1   Gait   Gait Yes   Gait 1   Assistive Device 1 Rolling walker (Front wheel walker)   Pattern 1 Decreased stride length   Gait Assistance 1 Supervision   Distance (Ft) 1 60 Feet   Safety Devices   Type of Devices Call bell in place   Plan   Prognosis Fair   PT Frequency 3x/wk   Recommendation   Equipment Recommended Rolling walker (Front wheel walker)     A: Pt agreeable to treatment and with notable improvement in gait pattern since writer last worked with patient, no longer demo's left foot drag while ambulating, rather, ambulates with step through pattern using RW. Pt endorses swelling and pain but does not appear limited this date by complaints.   P: Continue per PT Care Plan.       Coben Godshall C. Doristine Counter, PT, Lic # [id number]
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
## 2021-12-16 NOTE — Plan of Care (Signed)
Problem: Activity:  Goal: Mobility will be supported throughout the hospitalization,  Outcome: Progressing     Problem: Safety:  Goal: Will remain free from falls throughout the hospitalization,  Outcome: Progressing     Problem: Pain  Goal: Patient's pain/discomfort is manageable  Description: Assess and monitor patient's pain using appropriate pain scale. Collaborate with interdisciplinary team and initiate plan and interventions as ordered. Re-assess patient's pain level 30 - 60 minutes after pain management intervention.   Outcome: Progressing     Problem: Knowledge Deficit  Goal: Patient/S.O. demonstrates understanding of disease process, treatment plan, medications, and discharge instructions.  Description: Complete learning assessment and assess knowledge base  Outcome: Progressing       Pt is A/O X4 and is tolerating diet with no complaints of N/V. Pt tolerated ambulation in the hall. Pt still reports chronic pain and medication was administered with good effect. She is much more cooperative today, with no reports of aggressive behavior from staff. Currently resting in room with call light near by , will continue to monitor.

## 2021-12-17 DIAGNOSIS — S8390XA Sprain of unspecified site of unspecified knee, initial encounter: Secondary | ICD-10-CM

## 2021-12-17 DIAGNOSIS — F259 Schizoaffective disorder, unspecified: Secondary | ICD-10-CM

## 2021-12-17 DIAGNOSIS — S83262A Peripheral tear of lateral meniscus, current injury, left knee, initial encounter: Secondary | ICD-10-CM

## 2021-12-17 DIAGNOSIS — F251 Schizoaffective disorder, depressive type: Secondary | ICD-10-CM

## 2021-12-17 DIAGNOSIS — F1990 Other psychoactive substance use, unspecified, uncomplicated: Secondary | ICD-10-CM

## 2021-12-17 DIAGNOSIS — I5030 Unspecified diastolic (congestive) heart failure: Secondary | ICD-10-CM

## 2021-12-17 LAB — BASIC METABOLIC PANEL
ANION GAP: 13 mmol/L (ref 10–22)
BUN (UREA NITROGEN): 22 mg/dL — ABNORMAL HIGH (ref 7–18)
CALCIUM: 10 mg/dL (ref 8.5–10.5)
CARBON DIOXIDE: 32 mmol/L (ref 21–32)
CHLORIDE: 98 mmol/L (ref 98–107)
CREATININE: 0.8 mg/dL (ref 0.4–1.2)
ESTIMATED GLOMERULAR FILT RATE: >60 mL/min (ref >60)
Glucose Random: 133 mg/dL (ref 74–160)
POTASSIUM: 4 mmol/L (ref 3.5–5.1)
SODIUM: 142 mmol/L (ref 136–145)

## 2021-12-17 LAB — PHOSPHORUS MAGNESIUM
MAGNESIUM: 2.1 mg/dL (ref 1.6–2.6)
PHOSPHORUS: 4.4 mg/dL (ref 2.5–4.9)

## 2021-12-17 MED ORDER — PRAZOSIN HCL 1 MG PO CAPS
3.0000 mg | ORAL_CAPSULE | Freq: Every evening | ORAL | 0 refills | Status: DC
Start: 2021-12-17 — End: 2021-12-22

## 2021-12-17 MED ORDER — LEVETIRACETAM 750 MG PO TABS
750.0000 mg | ORAL_TABLET | Freq: Two times a day (BID) | ORAL | 0 refills | Status: DC
Start: 2021-12-17 — End: 2021-12-22

## 2021-12-17 MED ORDER — LEVOTHYROXINE SODIUM 25 MCG PO TABS
25.0000 ug | ORAL_TABLET | Freq: Every morning | ORAL | 0 refills | Status: DC
Start: 2021-12-17 — End: 2021-12-22

## 2021-12-17 MED ORDER — OXCARBAZEPINE 150 MG PO TABS
150.0000 mg | ORAL_TABLET | Freq: Two times a day (BID) | ORAL | 0 refills | Status: DC
Start: 2021-12-17 — End: 2021-12-22

## 2021-12-17 MED ORDER — ATORVASTATIN CALCIUM 80 MG PO TABS
80.0000 mg | ORAL_TABLET | Freq: Every day | ORAL | 0 refills | Status: DC
Start: 2021-12-17 — End: 2021-12-22

## 2021-12-17 MED ORDER — FLUOXETINE HCL 20 MG PO CAPS
20.0000 mg | ORAL_CAPSULE | Freq: Every day | ORAL | 0 refills | Status: DC
Start: 2021-12-17 — End: 2021-12-22

## 2021-12-17 MED ORDER — NICOTINE POLACRILEX 2 MG MT GUM
2.0000 mg | CHEWING_GUM | OROMUCOSAL | 1 refills | Status: DC | PRN
Start: 2021-12-17 — End: 2021-12-22

## 2021-12-17 MED ORDER — LOXAPINE SUCCINATE 25 MG PO CAPS
25.0000 mg | ORAL_CAPSULE | Freq: Two times a day (BID) | ORAL | 0 refills | Status: DC
Start: 2021-12-17 — End: 2021-12-22

## 2021-12-17 MED ORDER — TORSEMIDE 100 MG PO TABS
100.0000 mg | ORAL_TABLET | Freq: Two times a day (BID) | ORAL | 0 refills | Status: DC
Start: 2021-12-17 — End: 2021-12-22

## 2021-12-17 MED ORDER — LOXAPINE SUCCINATE 10 MG PO CAPS
10.0000 mg | ORAL_CAPSULE | Freq: Two times a day (BID) | ORAL | 0 refills | Status: DC
Start: 2021-12-17 — End: 2021-12-22

## 2021-12-17 MED ORDER — NALOXONE HCL 4 MG/0.1ML NA LIQD
NASAL | 0 refills | Status: DC
Start: 2021-12-17 — End: 2022-01-06

## 2021-12-17 MED ORDER — LORAZEPAM 1 MG PO TABS
1.0000 mg | ORAL_TABLET | Freq: Two times a day (BID) | ORAL | 0 refills | Status: DC | PRN
Start: 2021-12-17 — End: 2021-12-22

## 2021-12-17 MED ORDER — NICOTINE 10 MG/ML NA SOLN
1.0000 [IU] | Freq: Three times a day (TID) | NASAL | 0 refills | Status: DC | PRN
Start: 2021-12-17 — End: 2021-12-22

## 2021-12-17 MED ORDER — GABAPENTIN 300 MG PO CAPS
600.0000 mg | ORAL_CAPSULE | Freq: Three times a day (TID) | ORAL | 0 refills | Status: DC
Start: 2021-12-17 — End: 2021-12-22

## 2021-12-17 MED ORDER — NICOTINE 21 MG/24HR TD PT24
1.0000 | MEDICATED_PATCH | TRANSDERMAL | 0 refills | Status: DC
Start: 2021-12-17 — End: 2021-12-22

## 2021-12-17 MED ORDER — QUETIAPINE FUMARATE 50 MG PO TABS
50.0000 mg | ORAL_TABLET | Freq: Every evening | ORAL | 0 refills | Status: DC
Start: 2021-12-17 — End: 2021-12-22

## 2021-12-17 MED ORDER — BUPRENORPHINE HCL-NALOXONE HCL 8-2 MG SL SUBL
1.0000 | SUBLINGUAL_TABLET | Freq: Three times a day (TID) | SUBLINGUAL | 0 refills | Status: DC
Start: 2021-12-17 — End: 2021-12-22

## 2021-12-17 MED ORDER — EPINEPHRINE 0.15 MG/0.15ML IJ SOAJ
0.1500 mg | Freq: Once | INTRAMUSCULAR | 0 refills | Status: DC
Start: 2021-12-17 — End: 2021-12-22

## 2021-12-17 MED FILL — OXCARBAZEPIN 150MG: 30 days supply | Qty: 60 | Fill #0 | Status: CP

## 2021-12-17 MED FILL — GABAPENTIN 300 MG: 30 days supply | Qty: 180 | Fill #0 | Status: CP

## 2021-12-17 MED FILL — LOXAPINE 25MG: 30 days supply | Qty: 60 | Fill #0 | Status: CP

## 2021-12-17 MED FILL — NICOTROL NS SPR 10MG/ML: 14 days supply | Qty: 40 | Fill #0 | Status: CP

## 2021-12-17 MED FILL — EPINEPHRINE INJ 0.15MG (EPIPENJR): 1 days supply | Qty: 1 | Fill #0 | Status: CP

## 2021-12-17 MED FILL — BUPREN/NALOX SUB 8-2MG TABS: 7 days supply | Qty: 21 | Fill #0 | Status: CP

## 2021-12-17 MED FILL — LEVETIRACETA 750MG: 30 days supply | Qty: 60 | Fill #0 | Status: CP

## 2021-12-17 MED FILL — NARCAN 4 MG NASAL SPRAY: 1 days supply | Qty: 2 | Fill #0 | Status: CP

## 2021-12-17 MED FILL — LEVOTHYROXIN 25MCG: 30 days supply | Qty: 30 | Fill #0 | Status: CP

## 2021-12-17 MED FILL — FLUOXETINE   20MG: 30 days supply | Qty: 30 | Fill #0 | Status: CP

## 2021-12-17 MED FILL — PRAZOSIN HCL 1MG: 30 days supply | Qty: 90 | Fill #0 | Status: CP

## 2021-12-17 MED FILL — LORAZEPAM 1MG: 7 days supply | Qty: 14 | Fill #0 | Status: CP

## 2021-12-17 MED FILL — NICOTINE TD DIS 21MG/24H: 28 days supply | Qty: 28 | Fill #0 | Status: CP

## 2021-12-17 MED FILL — ATORVASTATIN 80MG: 30 days supply | Qty: 30 | Fill #0 | Status: CP

## 2021-12-17 MED FILL — NICOTINE POL GUM 2MG MINT: 9 days supply | Qty: 110 | Fill #0 | Status: CP

## 2021-12-17 MED FILL — LOXAPINE 10MG CAPSULE: 30 days supply | Qty: 60 | Fill #0

## 2021-12-17 MED FILL — TORSEMIDE 100MG: 30 days supply | Qty: 60 | Fill #0 | Status: CP

## 2021-12-17 MED FILL — QUETIAPINE 50MG: 30 days supply | Qty: 30 | Fill #0 | Status: CP

## 2021-12-17 NOTE — Progress Notes (Incomplete)
PROGRESS NOTE  12/17/2021    PATIENT INFO: Cheryl Zimmerman, [age] year old female  DATE OF ADMISSION: 12/05/21    ROOM/BED LOCATION:  610/610-A  HOSPITAL DAY: 9    INTERVAL EVENTS  None    SUBJECTIVE:  Nighttime dyspnea at baseline    Still with some knee pain but overall feels is better- chronic pain is uncontrolled but perhaps at/near baseline. Notes has been walking around okay.        In decent spirits this AM, excited about life after hospitalization    OBJECTIVE:    VITAL SIGNS      12/16/21  1918 12/16/21  2057 12/17/21  0504 12/17/21  0809   BP:  124/76 131/82    Pulse: 95 97 87    Resp:  20 20 18    Temp:  98.4 F (36.9 C) 97.5 F (36.4 C)    TempSrc:  Oral Oral    SpO2:  93% 92%    Weight:       Height:           INS/OUTS (PAST 24 HOURS)  I/O 24 Hrs:  In: 360 [P.O.:360]  Out: 800 [Urine:800]    PHYSICAL EXAM   GEN:          Uncomfortable, frustrated, no acute distress  CV:            WNL  PULM:       CTAB, decreased BS  ABD:          soft, NTND  EXT:          Pitting edema 2+ to mid-shin b/l  MSK:                  Mild swelling L knee, moderately TTP most notable at anteromedial joint space,  ROM limited to ~45 deg (limited by pain), not erythematous, no fluctuance  SKIN:         No visible rash  NEURO:    Alert and oriented- neuro exam limited by habitus and pain, though grossly normal sensation lower extremities b/l  RECENT LABS  BMP:   Recent Labs     12/16/21  1740 12/17/21  0745   NA 143 142   K 3.7 4.0   CL 98 98   CO2 33* 32   BUN 20* 22*   CREAT 0.8 0.8   GLUCOSER 135 133     Ca, Mg, Phos:   Recent Labs     12/16/21  1740 12/17/21  0745   CA 9.9 10.0   MG  --  2.1   PHOS  --  4.4     CBC: No results for input(s): WBC, HCT, PLTA, RBC in the last 72 hours.  Coagulation Labs: No results for input(s): PT, INR in the last 72 hours.    Invalid input(s): PTT  LFTs: No results for input(s): AST, ALT, TBILI, DBILI, ALKPHOS in the last 72 hours.  Pancreatic Enzymes: No results for input(s): AMY,  LIP in the last 72 hours.  Urinalysis:No results for input(s): UACOL, UACLA, UAGLU, UABIL, UAKET, SPEGRAVURINE, UAOCC, UAPH, UAPRO, UANIT, LEUKOCYTES in the last 72 hours.    Invalid input(s): UAOBU  Troponin: No results for input(s): TROPI in the last 72 hours.    MICROBIOLOGY REVIEW  No new microbiology results to review.    IMAGING AND OTHER STUDIES  No new imaging or other studies to review.    MEDICATIONS  See list in EPIC  ASSESSMENT & PLAN   Ms. 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 Transferred to inpatient Psychiatry for suicidal ideation, however, sustained a mechanical fall and could not move around the unit in her bariatric wheelchair and transferred back to Medicine. Now with resolved SI and course c/b left lateral and medial meniscal tears and sprain. Medically cleared awaiting STR placement.    # Mechanical fall  # Left knee pain   # Left lateral and medial meniscal tears, on MRI  - Orthopedics recommended no intervention at this time as tears appear chronic on imaging (confirmed by radiology) and would not benefit from brace as is mobilizing knee well and is stable on feet. Outpatient follow-up on 12/14/2021 cancelled as remains inpatient; ortho rescheduled for 12/21/2021  - ACE wrap.  - Pain control: Tylenol 650mg  QID, Naproxen 500mg  BID, Ice packs,  Suboxone 4mg  PRN for breakthrough pain.  - PT consulted    # Complex chronic pain syndrome  # Lumbar spinal stenosis with sciatica  -Buprenorphine-naloxone 8-2TID - confirmed on PDMP (+ PRN suboxone for acute pain as above)  -Fluoxetine 20mg  daily  -Gabapentin 600mg  TID  -Methocarbamol 750mg  TID AC  - TI: Chronic pain clinic  evaluation    #Agitation  #Suicidal ideation, resolved  #Hallucinations, persistent  #Schizoaffective disorder, bipolar type  #PTSD  #Anxiety  #Grief reaction  #History of suicide attempts  - SI resolved per psych eval, no need for 1:1 or to return to psych once medically optimized.  - Persistent visual hallucinations requiring increasing loxapine dose (see below)  - 3/26 pt agitated, throwing objects, using racial slurs. Suspect this reflects further psychiatric decompensation iso isolation/lack of autonomy, pain, immobility, etc; pt recovered from mood standpoint by the time of psychiatric assessment 3/27PM- 3/29 AM pt again with behavioral decompensation, tossing over tray table, verbally abusive and physically threatening towards staff, redirectable and calmed without pharmacologic intervention; psychiatry actively assessing.  3/30 more calm, less sedated- receiving prn benzo less with holding parameters now in place  Current regimen:  Prazosin 3mg  nightly, Fluoxetine 20mg  daily, Lorazepam 1mg  q4h prn, Loxapine 35mg  BID (increased from 30 BID today, 3/28), Quetiapine 50mg  nightly, Oxcarbazepine 150mg  BID  - Psych consulted, appreciate recs.  - For discharge: meeting 3/23, SW indicating pt to go to respite bed post STR- [facility] and [facility] will continue to provide support (see SW note 3/23)    #Substance use disorder with UDS + cocaine, fentanyl on 11/24/2021  #History of alcohol withdrawal, alcohol use disorder  #h/o housing instability  No current withdrawal  -Acamprosate  -Buprenorphine-naloxone for chronic pain as above  - SW consulted, appreciate recs    Resolving issues:  #Acute HFpEF exacerbation (EF 70%, no regional WMA, TTE 12/07/2021)  #COPD/Asthma  #Obesity  #Obstructive Sleep Apnea  #Chronic dyspnea, orthopnea, edema  10/2021 nuclear perfusion test w/out evidence of ischemia, though EF noted to be 40%. Repeat ECHO this admission WNL EF 70%  Etiology of HF unclear (though possibly alcohol + cocaine use  contributory?)  - Torsemide 100mg  BID maintenance  - Symbicort BID, Albuterol prn, Flonase BID  -Rescue albuterol as needed  - TI: Outpatient sleep study and Pulmonary follow-up/ PFTs  -SCDs and compression stockings for presumed venous stasis/insufficiency  Chronic issues:  #HLD:   LDL 111  -Atorvastatin  -Low-dose aspirin    #Seizure disorder  -Continue levetiracetam, gabapentin, oxcarbazepine    #Tobacco use disorder:  -Transdermal nicotine as needed  - nicotine gum as needed  [ ]  Consider ongoing pharmacologic treatment and connection with behavioral modification program    #Hepatitis C, 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     #Hypothyroidism:History of levothyroxine use.TSH normal on 11/24/2021.  -Levothyroxine    #Gastroesophageal reflux disease:  -Pantoprazole    Telemetry: No  Foley: No  FEN:   Orders Placed This Encounter      Diet - Base Diet: Modified/Restricted; Cardiac Restrictions: Heart Healthy (ALLOW CAFFEINE)  Prophylaxis: SQH BID    Medication Reconciliation: Done on admission  Changes from home medication regimen: as above    Code Status  Full Code    Health care proxy: None    PCP: None    Dispo: Inpatient pending Rehab acceptance

## 2021-12-17 NOTE — Progress Notes (Signed)
Pt discussed during MDR. MD Ue informed SW that pt wants to leave. At this time medical team reports that pt has capacity if she chooses to leave.     SW spoke w/ pt and explained medical team is trying to work on plan for respite bed. Pt is open to staying knowing there is a plan in the works. SW provided pt w/ clothing.     SW contacted [place] at [employer] re: pt. Cheryl Zimmerman is aware of the situation and is going to reach out to connection at [facility].   -----------------------------------------------------------------  Update as of 2:00    MD Cheryl Zimmerman informed SW pt wants to leave again. SW [place] and SW Cheryl Zimmerman spoke w/ pt. She is concerned about her SSA check. She said that after being in the hospital for 30 days she won't be eligible for it anymore. SW Cheryl Zimmerman tried to explain the guidelines but pt was adamant. Review of chart indicates she has been in the hospital (psych/[facility]surge) since 11/23/21 but pt believes she has been here for 30.     Pt said she needs to smoke due to enhanced cravings. SW will look into getting a nicotine inhaler for pt. Pt is open to staying if she gets inhaler as it will help her be calm. SW paged MD re: this issue.      Per direction of management team meeting is booked for 12:00 PM on 12/20/2021 to discuss discharge plan. SW also reached out to [facility] contact to discuss pt's capacity.     Outpatient Clinical Residential Manager Cheryl Zimmerman expressed concerns if pt leaves that she will get her SSA check on Monday and use it on drugs. Cheryl Zimmerman believes that the pt will most likely get a CCS bed through [place] on Tuesday.       -------------------------------------------------------------------  Update as of 3:45    Pt decided to leave. SW tried to highlight the CCS bed but pt was adamant. SW provided pt with a shelter list, made CCM referral, housing support resources, and addiction resources. She is waiting on a ride from a friend and the medical team is working on getting meds to bed.     Pt  reports that she plans to go to a shelter and will call shelters utilizing list provided. SW left message at multiple shelters asking if beds are available. SW will wait for response. Pt is aware that she may not find a shelter tonight and plans to stay in a hotel tonight using her savings.     Pt reports no additional questions or concerns.

## 2021-12-17 NOTE — Progress Notes (Addendum)
Pt is medically cleared for d/c today, and does not want to wait until next week for a respite bed with [facility], pt wants an apt. Pt will receive prescriptions to the bedside prior to d/c, a friend will provide transportation to a shelter on [address]. CM spoke with [facility] nurse from [facility] to inform her of pt's d/c today. TOC nurse will inform pt's Care Partner at [facility], she will reach out to pt re assisting with housing.       12/17/21 1518   Discharge Reviewed   Reviewed for discharge Yes   UR Completed? Yes   Final Discharge Plan    Was this a  3 day waiver? No   Discharge disposition   (a shelter)   Transportation at [facility]   International aid/development worker (Front wheel walker)   IM from medicare delivered N/A   Financial Assistance Pending Not applicable   Care Giver   Do you have a caregiver?  No

## 2021-12-17 NOTE — Event Note (Addendum)
Event note    Discussed at length with patient bedside, with Dr. [name].  Starting in the morning, patient expressed desire to leave the hospital. Continued to counsel and discuss. Patient requested nicotine inhaler trial but did not like it and wanted to leave to smoke. Furthermore on discussion, she is unwilling to stay until Tuesday for a CSS bed. If we had a 'better alternative' for her for housing, then perhaps should be consider staying. She said that we could continue looking for a place for her even after she has left, and call her if housing option is founding.    Patient is awaiting bedside delivered medications. She wanted to go out for a smoke and Dr. Otilio Miu will inform her that she needs to pick up her medications at the pharmacy.    She says she has a ride to go to the shelter.    I asked if there was anything else we could do to keep her in the hospital. She expressed that she did not want anything more to do with the [facility] team and requested to switch to [facility] or [facility]. She is also worried about her social security check.    - Patient does have decisional capacity to leave the hospital.  - I expressed that she can return to the ED at any time if she feels unwell.  - Dr. Otilio Miu provided his outpatient primary care clinic information for her to call to make an appointment, per patient preference.  - Discussed at length with Psychiatry, SW, CM, and PT today.  - Informed Leonie Green (CM) of these developments and conversation.      Deberah Pelton, MD, MPH  Hospitalist  [phone]

## 2021-12-17 NOTE — Progress Notes (Signed)
PROGRESS NOTE  12/17/2021    PATIENT INFO: Cheryl Zimmerman, [age] year old female  DATE OF ADMISSION: 12/05/21    ROOM/BED LOCATION:  610/610-A  HOSPITAL DAY: 9    INTERVAL EVENTS  None    SUBJECTIVE:  Nighttime dyspnea at baseline    Still with some knee pain but overall feels is better- chronic pain is uncontrolled but perhaps at/near baseline. Notes has been walking around okay    In better spirits this AM, less drowsy    OBJECTIVE:    VITAL SIGNS      12/16/21  1629 12/16/21  1631 12/16/21  1918 12/16/21  2057   BP: 127/84   124/76   Pulse: 98  95 97   Resp: 12 12  20    Temp: 97.8 F (36.6 C)   98.4 F (36.9 C)   TempSrc:    Oral   SpO2: 94%   93%   Weight:       Height:           INS/OUTS (PAST 24 HOURS)  I/O 24 Hrs:  In: 1560 [P.O.:1560]  Out: 3050 [Urine:3050]    PHYSICAL EXAM   GEN:          Uncomfortable, frustrated, no acute distress  CV:            WNL  PULM:       CTAB, decreased BS  ABD:          soft, NTND  EXT:          Pitting edema 2+ to mid-shin b/l  MSK:                  Mild swelling L knee, moderately TTP most notable at anteromedial joint space,  ROM limited to ~45 deg (limited by pain), not erythematous, no fluctuance  SKIN:         No visible rash  NEURO:    Alert and oriented- neuro exam limited by habitus and pain, though grossly normal sensation lower extremities b/l  RECENT LABS  BMP:   Recent Labs     12/16/21  1740   NA 143   K 3.7   CL 98   CO2 33*   BUN 20*   CREAT 0.8   GLUCOSER 135     Ca, Mg, Phos:   Recent Labs     12/16/21  1740   CA 9.9     CBC: No results for input(s): WBC, HCT, PLTA, RBC in the last 72 hours.  Coagulation Labs: No results for input(s): PT, INR in the last 72 hours.    Invalid input(s): PTT  LFTs: No results for input(s): AST, ALT, TBILI, DBILI, ALKPHOS in the last 72 hours.  Pancreatic Enzymes: No results for input(s): AMY, LIP in the last 72 hours.  Urinalysis:No results for input(s): UACOL, UACLA, UAGLU, UABIL, UAKET, SPEGRAVURINE, UAOCC, UAPH,  UAPRO, UANIT, LEUKOCYTES in the last 72 hours.    Invalid input(s): UAOBU  Troponin: No results for input(s): TROPI in the last 72 hours.    MICROBIOLOGY REVIEW  No new microbiology results to review.    IMAGING AND OTHER STUDIES  No new imaging or other studies to review.    MEDICATIONS  See list in EPIC    ASSESSMENT & PLAN   Ms. 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 Transferred to inpatient Psychiatry for suicidal ideation, however, sustained a mechanical fall and could not move around the unit in her bariatric wheelchair and transferred back to Medicine. Now with resolved SI and course c/b left lateral and medial meniscal tears and sprain. Medically cleared awaiting STR placement.    # Mechanical fall  # Left knee pain   # Left lateral and medial meniscal tears, on MRI  - Orthopedics recommended no intervention at this time as tears appear chronic on imaging (confirmed by radiology) and would not benefit from brace as is mobilizing knee well and is stable on feet. Outpatient follow-up on 12/14/2021 cancelled as remains inpatient; ortho rescheduled for 12/21/2021  - ACE wrap.  - Pain control: Tylenol 650mg  QID, Naproxen 500mg  BID, Ice packs,  Suboxone 4mg  PRN for breakthrough pain.  - PT consulted    # Complex chronic pain syndrome  # Lumbar spinal stenosis with sciatica  -Buprenorphine-naloxone 8-2TID - confirmed on PDMP (+ PRN suboxone for acute pain as above)  -Fluoxetine 20mg  daily  -Gabapentin 600mg  TID  -Methocarbamol 750mg  TID AC  - TI: Chronic pain clinic evaluation    #Agitation  #Suicidal ideation, resolved  #Hallucinations, persistent  #Schizoaffective disorder, bipolar  type  #PTSD  #Anxiety  #Grief reaction  #History of suicide attempts  - SI resolved per psych eval, no need for 1:1 or to return to psych once medically optimized.  - Persistent visual hallucinations requiring increasing loxapine dose (see below)  - 3/26 pt agitated, throwing objects, using racial slurs. Suspect this reflects further psychiatric decompensation iso isolation/lack of autonomy, pain, immobility, etc; pt recovered from mood standpoint by the time of psychiatric assessment 3/27PM- 3/29 AM pt again with behavioral decompensation, tossing over tray table, verbally abusive and physically threatening towards staff, redirectable and calmed without pharmacologic intervention; psychiatry actively assessing.  3/30 more calm, less sedated- receiving prn benzo less with holding parameters now in place  Current regimen:  Prazosin 3mg  nightly, Fluoxetine 20mg  daily, Lorazepam 1mg  q4h prn, Loxapine 35mg  BID (increased from 30 BID today, 3/28), Quetiapine 50mg  nightly, Oxcarbazepine 150mg  BID  - Psych consulted, appreciate recs.  - For discharge: meeting 3/23, SW indicating pt to go to respite bed post STR- [facility] and [facility] will continue to provide support (see SW note 3/23)    #Substance use disorder with UDS + cocaine, fentanyl on 11/24/2021  #History of alcohol withdrawal, alcohol use disorder  #h/o housing instability  No current withdrawal  -Acamprosate  -Buprenorphine-naloxone for chronic pain as above  - SW consulted, appreciate recs    Resolving issues:  #Acute HFpEF exacerbation (EF 70%, no regional WMA, TTE 12/07/2021)  #COPD/Asthma  #Obesity  #Obstructive Sleep Apnea  #Chronic dyspnea, orthopnea, edema  10/2021 nuclear perfusion test w/out evidence of ischemia, though EF noted to be 40%. Repeat ECHO this admission WNL EF 70%  Etiology of HF unclear (though possibly alcohol + cocaine use contributory?)  - Torsemide 100mg  BID maintenance  - Symbicort BID, Albuterol prn, Flonase BID  -Rescue albuterol as  needed  - TI: Outpatient sleep study and Pulmonary follow-up/ PFTs  -SCDs and compression stockings for presumed venous stasis/insufficiency      Chronic issues:  #HLD:   LDL 111  -Atorvastatin  -Low-dose aspirin    #Seizure disorder  -Continue levetiracetam, gabapentin, oxcarbazepine    #Tobacco use disorder:  -Transdermal nicotine as needed  -  nicotine gum as needed  [ ]  Consider ongoing pharmacologic treatment and connection with behavioral modification program    #Hepatitis C, 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     #Hypothyroidism:History of levothyroxine use.TSH normal on 11/24/2021.  -Levothyroxine    #Gastroesophageal reflux disease:  -Pantoprazole    Telemetry: No  Foley: No  FEN:   Orders Placed This Encounter      Diet - Base Diet: Modified/Restricted; Cardiac Restrictions: Heart Healthy (ALLOW CAFFEINE)  Prophylaxis: SQH BID    Medication Reconciliation: Done on admission  Changes from home medication regimen: as above    Code Status  Full Code    Health care proxy: None    PCP: None    Dispo: Inpatient pending Rehab acceptance

## 2021-12-17 NOTE — Initial Assessments (Signed)
Occupational Therapy Initial Assessment    S: "I'm homeless so I'm not going to be able to keep that with me all the time"    O: OT initial evaluation completed today. Additional information gathered via chart review and observation. Please see flowsheet for detailed findings.     12/17/21 1150   Language Information   Language of Care English   Evaluation Type   Evaluation Type Initial Evaluation   Rehab Discipline   Rehab Discipline OT   Safety Devices   Type of Devices Call bell in place   Services prior to admission?   Type of Home Care Services None   Premorbid Mobility   Transfers Independent   Walking Independent;Community distances   Walking assistive devices used Rollator   Stair negotiation Unable   ADL / IADL Baseline Status   ADL/IADL Baseline Status Yes   ADL's/IADL's   Dressing Independent   Bathing   (Sponge bathes, unable to wash hair)   [place] Required assistance   Living Situation   Living Setting Homeless/transient  (Previously living in group home)   Barriers   Barriers Comorbidities;Assets/ limited resources   Expression   Primary Mode of Expression Verbal   Auditory Comprehension   Commands WFL   Conversation Complex   Cognition   Attention [facility]   Orientation Level Oriented x4   Sensation   Light Touch No apparent deficits   IADL / ADL   IADL/ADL No  (Pt reports completing LB dressing task independently this AM, already completed ADL routine with staff assist)   RUE Assessment   RUE Assessment X  (Chronic shoulder ROM limitations 2/2 past fracture)   RUE AROM (degrees)   R Shoulder Flexion  0-170 90 Degrees   R Shoulder ABduction 0-140 90 Degrees   R Shoulder Internal Rotation  0-70 20 Degrees   R Shoulder External Rotation  0-90 20 Degrees   R Elbow Flexion/Extension 0-135-150 150 Degrees   R Elbow Extension 0-135-150 0 Degrees   RUE Strength   RUE Overall Strength WFL   RUE Overall Strength 5/5   LUE Assessment   LUE Assessment   (Chronic shoulder ROM  limitations 2/2 past fracture)   LUE AROM (degrees)   LUE Overall AROM Deficits   L Shoulder Flexion  0-170 75 Degrees   L Shoulder ABduction 0-40 80 Degrees   L Elbow Flexion/Extension 0-135-150 150 Degrees   L Elbow Extension 0-135-150 0 Degrees   LUE Strength   LUE Overall Strength Deficits;Due to pain  (NT due to pt report of pain)   R Hand Evaluation   R Hand Evaluation WFL   L Hand Evaluation   L Hand Evaluation WFL   RLE Assessment   RLE Assessment WFL   LLE Assessment   LLE Assessment X  (s/p fall with lateral and medial meniscus sprain and tear)   Mobility / Balance   Mobility / Balance Yes   Gait   Gait Yes   Gait 1   Assistive Device 1 Rollator (Four wheel walker)   Pattern 1 Decreased stride length   Gait Assistance 1 Supervision   Distance (Ft) 1 20 Feet   Activity Tolerance   Activity Tolerance Tolerates 10 - 20 min treatment without fatigue   Balance   Sitting - Static Sits without support for > 30 sec;Feet supported   Sitting - Dynamic Moves/returns truncal midpoint greater than 2 inches in all planes;Feet supported   Standing - Static Able  to maintain 60 sec;Bilateral upper extremity supported   Standing - Dynamic Lateral lean;Forward lean   Transfers   Transfer Yes   Transfer 1   Transfer From 1 Bed   Transfer Type 1 To and from   Transfer to 1 Stand   Technique 1 Sit to stand;Stand to sit   Transfer Device 1 Rollator (Four wheel walker)   Transfer Level of Assistance 1 Independent   Trials/Comments 1 x1   Transfers 2   Transfer From 2 Extra wide bedside commode   Transfer Type 2 To and from   Transfer to 2 Stand   Technique 2 Sit to stand;Stand to sit   Transfer Device 2 Rollator (Four wheel walker)   Transfer Level of Assistance 2 Independent   Trials/Comments 2 x1   Plan   OT Frequency 2x/wk   Recommendation   Recommendation   (STR)   Equipment Recommended Long-handled sponge;Other (comment)  (LHAE for peri-care and hair grooming)     A: Pt is a 51 year old morbidly obese, female with  Schizoaffective disorder - bipolar type, multiple suicide attempts, housing instability, substance use disorder, PTSD, seizure disorder, complex pain syndrome on suboxone, HFpEF, asthma/ COPD, who was admitted for suicidal ideation and acute on chronic HFpEF which improved with diuresis. Transferred to inpatient Psychiatry for suicidal ideation, however, sustained a mechanical fall and could not move around the unit in her bariatric wheelchair and transferred back to Medicine. Now with resolved SI and course c/b left lateral and medial meniscal tears and sprain, ortho recommending conservative treatment.     At baseline, pt with unclear living situation (group home vs transient) but reports she was previously independent with dressing, toileting, and basic grooming tasks. Pt reports difficulty with UB bathing and hair care at baseline d/t past injuries to bil shoulders which caused chronic ROM limitations. During assessment, pt demo'd ability to perform transfers independently and ambulate to commode with steady gait. Pt presenting with decreased ROM in shoulders as above and limited ability to perform peri-care and hair hygiene. Discussed use of AE to assist with ADL performance and improve general hygiene; pt was hesitantly agreeable however expressed concern about ability to carryover use of AE d/t uncertain living setting. Pt would benefit from skilled OT while inpatient to explore adaptive strategies and use of AE to perform self care tasks independently in the community. Recommend d/c to STR or to group home with services for dressing/bathing assist as pt needs increased support with ADLs at this time.     OT STG=LTG  Pt will perform UB bathing task with MI using LHAE as needed in 2 visits.   Pt will perform peri-hygiene after toileting with MI using AE as needed in 2 visits.   Pt will perform hair grooming task with MI and AE as needed in 2 visits.     P: OT 2 times a week for as long as tolerated. Recommend  STR vs group home with PCA services upon discharge.    [place], [place], Lic # [id number]

## 2021-12-17 NOTE — Progress Notes (Addendum)
Pt was discussed in MDR. Pt has progressed with PT, has been ambulating in the hallway, took a shower. Pt gave up her bed at her group home, and had agreed last week to a respite bed. Now pt wants to be d/c'd to a shelter and not wait. Team has t/w pt all day re the risk of d/c'ing to a shelter vs a respite bed with [facility]. Pt states that she may stay at a [employer]. Pt states a female friend is providing transportation at d/c. Pt will receive prescriptions to the bedside prior to d.c

## 2021-12-17 NOTE — Consults (Signed)
PULMONARY CONSULT NOTE  Cheryl Zimmerman   MRN [id number]   610/[id number]  Date of Admission 12/05/21     Date of Service 12/17/2021   CODE: Full Code     Requesting Service: Medicine  Reason for Consultation: Mild hypoxemia                       CC: They said I might have sleep apnea     HPI: 51 year old old female with a history of morbid obesity, schizoaffective disorder, substance use disorder, seizure disorder, tobacco use disorder, HFpEF, presumed COPD, who was admitted 9 days ago for mechanical fall while in inpatient psychiatry for SI. She has been treated for exacerbation of her HFpEF with improvement. Pulmonary is asked to see her in consultation given her periodic need for supplemental oxygen.    On my evaluation, patient is breathing comfortably on room air, sitting up at bedside.    She tells me she was told she had COPD in the past, but that there was no specific testing to arrive at this diagnosis. She was never told she had emphysema (no evidence of emphysema on available imaging). She describes subsequent to her COPD diagnosis, she underwent what sounds like PFT testing at [facility]) and was told that test showed she did not have COPD.    She tells me she uses inhalers at home but has never found them helpful for her breathing.    She does endorse dyspnea on exertion if she moves to quick, or bends over to retrieve things. She recovers within 1 minute. She denies cough or phlegm.    She endorses orthopnea, LE edema.    She does describe poor sleep, has been told she snores loudly ("Like a bear in a cave"), has never been told she becomes apneic with sleep but will periodically wake up gasping/choking which does scare her.    She tells me she started smoking age 14, at her max was smoking 3ppd, now 2ppd and she is interested in quitting.     Social History     Socioeconomic History    Marital status: Divorced     Spouse name: Not on file    Number of children: Not on file     Years of education: Not on file    Highest education level: Not on file   Occupational History    Not on file   Tobacco Use    Smoking status: Every Day     Packs/day: 3.00     Years: 30.00     Pack years: 90.00     Types: Cigarettes    Smokeless tobacco: Never   Substance and Sexual Activity    Alcohol use: Yes    Drug use: Yes     Types: Marijuana    Sexual activity: Yes     Partners: Male   Other Topics Concern    Not on file   Social History Narrative    Not on file   Social Determinants of Health  Financial Resource Strain: Not on file  Food Insecurity: Not on file  Transportation Needs: Not on file  Physical Activity: Not on file  Stress: Not on file  Social Connections: Not on file  Intimate Partner Violence: Not on file  Housing Stability: Not on file       ROS: A complete review of systems was unremarkable except for what was mentioned in the HPI.  Inpatient Problem List:  Patient Active Problem List:     Chest pain     Psychiatric disorder     SOB (shortness of breath)     Hypoxia     Suicidal behavior with attempted self-injury ([name])     Suicidal ideations     Tobacco use     Systolic congestive heart failure ([name])     Seizure disorder ([name])     Substance dependence ([name])     Schizoaffective disorder, depressive type ([name])     Knee pain       Past Medical History:  Past Medical History:  schizo: Bipolar affective ([name])  No date: Chronic systolic CHF (congestive heart failure) ([name])  No date: Depression  depress: Schizo affective schizophrenia ([name])  No date: Substance abuse ([name])     Past Surgical History:  No past surgical history on file.     Current Inpatient Medications:   COVID-19 mRNA Bivalent vaccine  0.5 mL Intramuscular Once    loxapine  35 mg Oral BID    acetaminophen  650 mg Oral Q6H SCH    QUEtiapine  50 mg Oral Nightly    torsemide  100 mg Oral BID    prazosin  3 mg Oral Nightly    diclofenac  4 g Topical 4x Daily    FLUoxetine  20 mg Oral Daily    heparin (porcine)   5,000 Units Subcutaneous Q12H SCH    lidocaine  1 patch Topical Q24H    acamprosate  666 mg Oral TID    aspirin  81 mg Oral Daily    atorvastatin  80 mg Oral Nightly    budesonide-formoterol  2 puff Inhalation BID    buprenorphine-naloxone  8 mg Sublingual TID    docusate sodium  100 mg Oral BID    ferrous gluconate  324 mg Oral Once per day on Mon Wed Fri    fluticasone  1 spray Each Nostril BID    gabapentin  600 mg Oral TID    levETIRAcetam  750 mg Oral BID    levothyroxine  25 mcg Oral DAILY    melatonin  3 mg Oral Nightly    methocarbamol  750 mg Oral TID AC    nicotine  1 patch Transdermal Daily    OXcarbazepine  150 mg Oral BID    pantoprazole  40 mg Oral Daily    sennosides  17.2 mg Oral Nightly    thiamine  100 mg Oral Daily        Outpatient Medications:  Current Outpatient Medications   Medication Instructions    acamprosate (CAMPRAL) 666 mg, Oral, 3 TIMES DAILY    albuterol HFA 108 (90 Base) MCG/ACT inhaler 2 puffs, Inhalation, EVERY 4 HOURS PRN    aspirin 81 mg, Oral, DAILY    atorvastatin (LIPITOR) 80 mg, Oral, NIGHTLY    budesonide-formoterol (SYMBICORT) 160-4.5 MCG/ACT inhaler 2 puffs, Inhalation, 2 TIMES DAILY    buprenorphine-naloxone (SUBOXONE) 8-2 MG sublingual tablet 1 tablet, Sublingual, 3 TIMES DAILY    diclofenac (VOLTAREN) 4 g, Topical, 4 TIMES DAILY PRN    docusate sodium (COLACE) 100 mg, Oral, DAILY PRN    DULoxetine HCl 40 mg, Oral, DAILY    EPINEPHrine (EPIPEN JR 1:2000) 0.15 mg, Intramuscular, PRN    ferrous gluconate (FERGON) 324 mg, Oral, THREE TIMES WEEKLY, Monday, Wednesday, Friday    ferrous sulfate 324 mg, Oral, EVERY 48 HOURS    fluticasone (FLONASE) 50 MCG/ACT nasal spray 1 spray, Each Nostril,  2 TIMES DAILY    gabapentin (NEURONTIN) 600 mg, Oral, 3 TIMES DAILY    haloperidol (HALDOL) 5 mg, Oral, 3 TIMES DAILY    levETIRAcetam (KEPPRA) 750 mg, Oral, 2 TIMES DAILY    levothyroxine (SYNTHROID) 25 mcg, Oral, EVERY MORNING BEFORE BREAKFAST     loratadine (CLARITIN) 10 mg, Oral, DAILY    LORazepam (ATIVAN) 1 mg, Oral, 2 TIMES DAILY PRN    melatonin 3 mg, Oral, NIGHTLY    methocarbamol (ROBAXIN) 750 mg, Oral, 3 TIMES DAILY BEFORE MEALS    naproxen (NAPROSYN) 250 mg, Oral, 2 TIMES DAILY PRN    nicotine (NICODERM CQ) 21 MG/24HR 1 patch, Transdermal, DAILY    nicotine polacrilex (NICORETTE) 2 mg, Oral, EVERY 1 HOUR PRN    nystatin (MYCOSTATIN) cream Topical, 2 TIMES DAILY PRN    nystatin (MYCOSTATIN) powder Topical, 2 TIMES DAILY PRN    omeprazole (PRILOSEC) 40 mg, Oral, DAILY    ondansetron (ZOFRAN-ODT) 4 mg, Oral, EVERY 8 HOURS PRN    OXcarbazepine (TRILEPTAL) 150 mg, Oral, 2 TIMES DAILY    potassium chloride SA (K-DUR) 20 MEQ tablet 20 mEq, Oral, 2 TIMES DAILY    prazosin (MINIPRESS) 2 mg, Oral, NIGHTLY    QUEtiapine (SEROQUEL) 50 mg, Oral, EVERY 6 HOURS PRN    sennosides (SENOKOT) 17.2 mg, Oral, NIGHTLY PRN    thiamine (VITAMIN B-1) 100 mg, Oral, DAILY    torsemide (DEMADEX) 60 mg, Oral, 2 TIMES DAILY        Allergies:Review of Patient's Allergies indicates:   Bee venom               Anaphylaxis   Carbamazepine           Hives   Methylprednisolone      Dizziness, Drowsiness    Comment:Also believes syncope. Seems to tolerate             inhalational and epidural steroid.   Nutritional supplem*       Hydroxyzine             Nausea Only     Family History:   Mother had COPD, was a heavy smoker. Her uncle died of lung cancer     Current Vitals  Temp:  [97.4 F (36.3 C)-98.4 F (36.9 C)] 97.5 F (36.4 C)  Pulse:  [82-98] 87  Resp:  [12-20] 18  BP: (118-131)/(68-84) 131/82     Vitals Range 8 hours:  BP: (131)/(82)   Temp:  [97.5 F (36.4 C)]   Pulse:  [87]   Resp:  [18-20]   SpO2:  [92 %]      I/Os    Intake/Output Summary (Last 24 hours) at 12/17/2021 1053  Last data filed at 12/17/2021 0900  Gross per 24 hour   Intake 2100 ml   Output 2750 ml   Net -650 ml        PHYSICAL EXAM  GENERAL: No apparent distress. Alert and conversant. Non-toxic  appearing. Sitting up at bedside.  HEENT: NC/AT. EOMI. Anicteric. Moist mucus membranes. Oropharynx Clear. Neck Supple.   LYMPH: no cervical or supraclavicular LAD  CHEST: CTAB, no w/r/r  HEART: RRR, no murmurs  ABDOMEN: BS+, Soft, NT/ND. No HSM. Obese  EXT: WWP. 2+ pitting edema LE bilateral  NEURO: CN2-12 grossly intact. Non-focal.   PSYCH: normal affect., good attention  SKIN: no rashes or large ecchymoses     I have personally reviewed the laboratory results, pathology, microbiology cultures, and images. Notable findings include:  VBG  12/01/2021: 7.46/53    CT chest 08/2021 shows no emphysema or airways disease (outside study)    Chest xray here shows no evidence of hyperexpansion.    No PFTs on record.    Serum bicarb 30-33     IMPRESSION / RECS:  #Intermittent mild hypoxia  #HFpEF with acute exacerbation  #Probable sleep disordered breathing  It is not clear to me that [name] has COPD. She describes well what sounds [facility] when she was told this test showed no COPD. Inhalers are generally not helpful for her, no evidence of emphysema. However, I do not see a record of these PFTs to confirm presence/absence of COPD. She does exhibit signs of volume overload consistent with her HFpEF which could certainly explain her intermittent need for low-dose oxygen, particularly as it seems to coincide with suboptimal torsemide adherence.    I also do have high suspicion for sleep disordered breathing, which can certainly explain her nocturnal hypoxia. Suspect either OSA and/or OHVS given obese body habitus and VBG this month showing chronic CO2 retention. Features which suggest high pre-test probability for OSA include loud snoring, gasping/choking, nocturnal hypoxia inpatient, and large neck circumference. We did discuss the diagnosis of OSA, and that NIPPV is often used to treat this as it is the most effective therapy for OSA/OHVS, but that alternative therapies do exist  (e.g. surgery, oral appliance). She does raise appropriate concern of being able to tolerate the mask given her anxiety (She thinks she could tolerate nasal pillows) and where to keep her machine as she is undomiciled. She is eager to undergo sleep testing.    RECOMMEND:  -Would continue with diuresis per primary team, still with significant LE edema and orthopnea, defer to primary team regarding dosing/route  -She would benefit from outpatient sleep study  -Outpatient PFT (or obtain PFT from [facility])  -Would continue her home inhalers for now pending PFT    We will sign off at this time. Plan of care reviewed with primary team and patient.  Feel free to call if any acute issues or questions.    I spent a total of 65 minutes today on this patients visit. This included face to face time, more than 50% of which was spent in counseling or coordinating care, for the above medical problem(s). The remainder of the time was spent on review of the patient record (including but not limited to clinical notes, outside records, laboratory and radiographic studies), medical decision-making, and documentation of the visit.     Electronically signed by:   Luther BradleyPatrick Leonette Tischer, DO  Pulmonary and Critical Care Medicine

## 2021-12-17 NOTE — Plan of Care (Signed)
Pt alert and oriented. LS clear, diminished, denies chest pain, reports DOE, remains on RA. Abdomen soft non tender with positive bs noted tolerating po intake denies nausea. BLE with +2 edema noted. Up ambulating independently with rolling walker gait steady, showering independently and independent with most ADL's this morning. Encouraged to call for assist as needed. stating she plans to leave today. Vss. Afebrile checked on frequently.     Problem: Pain  Goal: Patient's pain/discomfort is manageable  Description: Assess and monitor patient's pain using appropriate pain scale. Collaborate with interdisciplinary team and initiate plan and interventions as ordered. Re-assess patient's pain level 30 - 60 minutes after pain management intervention.   Outcome: Not Progressing     Pt continues to report bilateral knee pain this shift, diclofenac cream applied along with lidocaine patches with good affect encouraged to call if pain level increases

## 2021-12-17 NOTE — Plan of Care (Signed)
Problem: Activity:  Goal: Mobility will be supported throughout the hospitalization,  Outcome: Progressing  Note: Patient ambulating in hallway independently with use of walker with staff near by. Fall precautions continue to be in place. Bed alarm on for safety.      Problem: Pain  Goal: Patient's pain/discomfort is manageable  Description: Assess and monitor patient's pain using appropriate pain scale. Collaborate with interdisciplinary team and initiate plan and interventions as ordered. Re-assess patient's pain level 30 - 60 minutes after pain management intervention.   Outcome: Progressing  Note: Patient continues to c/o pain to left knee. Meds as ordered. Left knee wrapped with ace wrap per patient request.

## 2021-12-17 NOTE — RN Shift Note (Signed)
Pt out at nursing station multiple times this afternoon stating she wanted to be discharged. Pt reminded that staff was searching for housing for her but pt was not interested in waiting. Pt discharged at 1632, discharge paperwork reviewed with pt, all questions and concerns addressed, Dr. Otilio Miu delivered part of pt medications to bedside but the rest was not yet ready, pt refused to wait, wheelchair ordered for pt discharge but pt refused to wait for wheelchair, discharged ambulatory with all belongings, [name] MD planned to deliver medications out to pts car for her and pt aware.

## 2021-12-18 ENCOUNTER — Inpatient Hospital Stay
Admission: EM | Admit: 2021-12-18 | Discharge: 2021-12-22 | DRG: 292 | Disposition: A | Discharge status: Other Acute Inpatient Hospital | Payer: No Typology Code available for payment source | Attending: Internal Medicine | Admitting: Internal Medicine

## 2021-12-18 DIAGNOSIS — E039 Hypothyroidism, unspecified: Secondary | ICD-10-CM | POA: Diagnosis present

## 2021-12-18 DIAGNOSIS — R45850 Homicidal ideations: Secondary | ICD-10-CM

## 2021-12-18 DIAGNOSIS — F1090 Alcohol use, unspecified, uncomplicated: Secondary | ICD-10-CM | POA: Diagnosis present

## 2021-12-18 DIAGNOSIS — F1490 Cocaine use, unspecified, uncomplicated: Secondary | ICD-10-CM | POA: Diagnosis present

## 2021-12-18 DIAGNOSIS — F4310 Post-traumatic stress disorder, unspecified: Secondary | ICD-10-CM | POA: Diagnosis present

## 2021-12-18 DIAGNOSIS — G894 Chronic pain syndrome: Secondary | ICD-10-CM | POA: Diagnosis present

## 2021-12-18 DIAGNOSIS — R45851 Suicidal ideations: Secondary | ICD-10-CM | POA: Diagnosis not present

## 2021-12-18 DIAGNOSIS — J449 Chronic obstructive pulmonary disease, unspecified: Secondary | ICD-10-CM | POA: Diagnosis present

## 2021-12-18 DIAGNOSIS — Z20822 Contact with and (suspected) exposure to covid-19: Secondary | ICD-10-CM

## 2021-12-18 DIAGNOSIS — T1491XA Suicide attempt, initial encounter: Secondary | ICD-10-CM | POA: Diagnosis present

## 2021-12-18 DIAGNOSIS — F17210 Nicotine dependence, cigarettes, uncomplicated: Secondary | ICD-10-CM

## 2021-12-18 DIAGNOSIS — I5031 Acute diastolic (congestive) heart failure: Principal | ICD-10-CM | POA: Diagnosis present

## 2021-12-18 DIAGNOSIS — M48061 Spinal stenosis, lumbar 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 claudication: Secondary | ICD-10-CM | POA: Diagnosis present

## 2021-12-18 DIAGNOSIS — G40909 Epilepsy, unspecified, not intractable, without status epilepticus: Secondary | ICD-10-CM | POA: Diagnosis present

## 2021-12-18 DIAGNOSIS — M23204 Derangement of unspecified medial meniscus due to old tear or injury, left knee: Secondary | ICD-10-CM | POA: Diagnosis present

## 2021-12-18 DIAGNOSIS — Z720 Tobacco use: Secondary | ICD-10-CM | POA: Diagnosis present

## 2021-12-18 DIAGNOSIS — F1190 Opioid use, unspecified, uncomplicated: Secondary | ICD-10-CM | POA: Diagnosis present

## 2021-12-18 DIAGNOSIS — E662 Morbid (severe) obesity with alveolar hypoventilation: Secondary | ICD-10-CM | POA: Diagnosis present

## 2021-12-18 DIAGNOSIS — F333 Major depressive disorder, recurrent, severe with psychotic symptoms: Secondary | ICD-10-CM | POA: Diagnosis present

## 2021-12-18 DIAGNOSIS — M23201 Derangement of unspecified lateral meniscus due to old tear or injury, left knee: Secondary | ICD-10-CM | POA: Diagnosis present

## 2021-12-18 DIAGNOSIS — I5030 Unspecified diastolic (congestive) heart failure: Secondary | ICD-10-CM | POA: Diagnosis present

## 2021-12-18 DIAGNOSIS — F1290 Cannabis use, unspecified, uncomplicated: Secondary | ICD-10-CM | POA: Diagnosis present

## 2021-12-18 LAB — CBC, PLATELET & DIFFERENTIAL
ABSOLUTE BASO COUNT: 0 10*3/uL (ref 0.0–0.1)
ABSOLUTE EOSINOPHIL COUNT: 0.1 10*3/uL (ref 0.0–0.8)
ABSOLUTE IMM GRAN COUNT: 0.05 10*3/uL (ref 0.00–0.10)
ABSOLUTE LYMPH COUNT: 2.5 10*3/uL (ref 0.6–5.9)
ABSOLUTE MONO COUNT: 0.7 10*3/uL (ref 0.2–1.4)
ABSOLUTE NEUTROPHIL COUNT: 6.6 10*3/uL (ref 1.6–8.3)
ABSOLUTE NRBC COUNT: 0 10*3/uL (ref 0.0–0.0)
BASOPHIL %: 0.2 % (ref 0.0–1.2)
EOSINOPHIL %: 1.4 % (ref 0.0–7.0)
HEMATOCRIT: 47.4 % — ABNORMAL HIGH (ref 34.1–44.9)
HEMOGLOBIN: 15.5 g/dL (ref 11.2–15.7)
IMMATURE GRANULOCYTE %: 0.5 % (ref 0.0–1.0)
LYMPHOCYTE %: 25 % (ref 15.0–54.0)
MEAN CORP HGB CONC: 32.7 g/dL (ref 31.0–37.0)
MEAN CORPUSCULAR HGB: 30.8 pg (ref 26.0–34.0)
MEAN CORPUSCULAR VOL: 94.2 fl (ref 80.0–100.0)
MEAN PLATELET VOLUME: 10.9 fL (ref 8.7–12.5)
MONOCYTE %: 7 % (ref 4.0–13.0)
NEUTROPHIL %: 65.9 % (ref 40.0–75.0)
NRBC %: 0 % (ref 0.0–0.0)
PLATELET COUNT: 301 10*3/uL (ref 150–400)
RBC DISTRIBUTION WIDTH STD DEV: 51 fL — ABNORMAL HIGH (ref 35.1–46.3)
RED BLOOD CELL COUNT: 5.03 M/uL (ref 3.90–5.20)
WHITE BLOOD CELL COUNT: 10 10*3/uL (ref 4.0–11.0)

## 2021-12-18 LAB — URINE DRUG SCREEN
AMPHETAMINES URINE: NEGATIVE ng/mL
BENZODIAZEPINES URINE: NEGATIVE ng/mL
BUPRENORPHINE SCREEN URINE: POSITIVE ng/mL — AB
CANNABINOIDS URINE: POSITIVE ng/mL — AB
COCAINE METABOLITES URINE: POSITIVE ng/mL — AB
ETHANOL URINE: NEGATIVE mg/dL
FENTANYL URINE: NEGATIVE ng/mL
METHADONE URINE: NEGATIVE ng/mL
OPIATES URINE: NEGATIVE ng/mL
OXYCOD SCRN URINE: NEGATIVE ng/mL
SPEC VALIDITY SPECIFIC GRAVITY: 1.011 (ref 1.003–1.035)
SPECIMEN VALIDITY URINE CREAT: 66 mg/dL (ref >20)
SPECIMEN VALIDITY URINE PH: 8.6 — ABNORMAL HIGH (ref 5.0–8.5)

## 2021-12-18 LAB — COVID-19 INPATIENT: COVID-19 INPATIENT: NEGATIVE

## 2021-12-18 MED ORDER — GABAPENTIN 300 MG PO CAPS
600.0000 mg | ORAL_CAPSULE | Freq: Three times a day (TID) | ORAL | Status: DC
Start: 2021-12-18 — End: 2021-12-22
  Administered 2021-12-18 – 2021-12-22 (×12): 600 mg via ORAL
  Filled 2021-12-18 (×12): qty 2

## 2021-12-18 MED ORDER — METHOCARBAMOL 500 MG PO TABS
750.0000 mg | ORAL_TABLET | Freq: Three times a day (TID) | ORAL | Status: DC
Start: 2021-12-18 — End: 2021-12-22
  Administered 2021-12-18 – 2021-12-22 (×12): 750 mg via ORAL
  Filled 2021-12-18 (×12): qty 2

## 2021-12-18 MED ORDER — NAPROXEN 250 MG PO TABS
500.0000 mg | ORAL_TABLET | Freq: Two times a day (BID) | ORAL | Status: DC
Start: 2021-12-19 — End: 2021-12-22
  Administered 2021-12-19 – 2021-12-22 (×7): 500 mg via ORAL
  Filled 2021-12-18 (×8): qty 2

## 2021-12-18 MED ORDER — KETOROLAC TROMETHAMINE 30 MG/ML INJ
30.0000 mg | Freq: Once | Status: AC
Start: 2021-12-18 — End: 2021-12-18
  Administered 2021-12-18: 30 mg via INTRAMUSCULAR
  Filled 2021-12-18: qty 1

## 2021-12-18 MED ORDER — BUPRENORPHINE HCL-NALOXONE HCL 8-2 MG SL SUBL
8.0000 mg | SUBLINGUAL_TABLET | Freq: Three times a day (TID) | SUBLINGUAL | Status: DC
Start: 2021-12-18 — End: 2021-12-22
  Administered 2021-12-18 – 2021-12-22 (×12): 1 via SUBLINGUAL
  Filled 2021-12-18 (×12): qty 1

## 2021-12-18 MED ORDER — LORAZEPAM 1 MG PO TABS
1.0000 mg | ORAL_TABLET | Freq: Two times a day (BID) | ORAL | Status: DC | PRN
Start: 2021-12-18 — End: 2021-12-22
  Administered 2021-12-18 – 2021-12-22 (×8): 1 mg via ORAL
  Filled 2021-12-18 (×9): qty 1

## 2021-12-18 NOTE — Discharge Summary (Addendum)
Physician Discharge Summary     Patient ID:  Name Cheryl Zimmerman  MRN [id number]   Age 51 year old  DOB 08-Feb-1971    Code Status at Admission: Full Code  Code Status at Discharge: Full Code  Healthcare Proxy and Phone Number: Data Unavailable Data Unavailable    Admit Date: 12/05/21      Discharge Date and Time: 12/17/2021 5:00PM     Admitting Physician: Charlott Rakes, MD    Discharge Physicians:    Margaretha Seeds, MD   Resident: Elyse Hsu, MD   Attending: Deberah Pelton, MD    Discharge Diagnoses:   Mechanical Fall  Knee sprain  Left lateral and medial meniscal tears, chronic  Complex chronic pain syndrome  SI  Schizoaffective disorder  Hallucinations  Polysubstance use  Housing instability  HFpEF  Obesity  OSA, suspected    *Patient preferred to be discharged on 12/17/21 without stable housing option. Our SW team and pt's [facility] team were actively working to find respite bed for patient at time of discharge. She left prior to receiving bedside medication delivery. This Clinical research associate contacted patient 3/31PM via phone informing pt that her medications are available for pickup at [facility], and pt intends to pick up medication 12/18/2021. She informed this Clinical research associate had safe place to stay, unclear where.    Issues for Follow up:   [ ]  Would benefit from outpatient sleep study and PFTs  [ ]  Please obtain BMP, Phos Mg, standing weight and volume status assessment at follow up for HFpEF. Discharged on torsemide 100mg  BID, adjust as appropriate  [ ]  Consider SGLT2 for HFpEF  [ ]  ensure has f/u with psychiatry  [ ]  assess for housing instability  [ ]  assess substance use  [ ]  consider referral to chronic pain clinic    Relevant Past Medical History: Past Medical History:  schizo: Bipolar affective ([name])  No date: Chronic systolic CHF (congestive heart failure) ([name])  No date: Depression  depress: Schizo affective schizophrenia ([name])  No date: Substance abuse ([name])     Summary of the Presenting Complaint:   "51 y.o.  Fschizoaffective disorderinitiallyadmitted to the inpatient Psychiatry unit for management of suicidal ideationbut transferred to medicine s/p mechanical fall.  She endorses pain in left knee and reports that she is unable to walk using walker.  She further reports decreased sensation on left lower extremity compared to right which is new since the fall.  Patient reports that she was walking using walker when left knee "gave out" causing her to fall downwards onto her left knee.  During this time the walker flew out from under her and she fell forward hitting her head.  She denies any LOC, lightheadedness, dizziness prior to or after the fall.  She denies new pain anywhere else.  She endorses chronic back pain but no worsening.  She denies urinary/bowel incontinence.  She denies SI, HI at this time"    Hospital Course:   Mechanical fall  Left knee pain   Left lateral and medial meniscal tears, on MRI  Pt with acute L knee pain after mechanical fall on psych floor/service. Orthopedics recommended no intervention at this time as felt meniscal tears appeared chronic on imaging (confirmed by radiology) and would not benefit from brace as is mobilizing knee well and is stable on feet. Has outpatient ortho f/u outpatient.  Managed with ACE wrap, Tylenol 650mg  QID, Naproxen 500mg  BID (from home 250 BID), Ice packs.  Suboxone 4mg  PRN for breakthrough pain.  Discharged with T  PT consulted and worked with patient daily. She initially was unable to walk but gradually returned to her baseline functional status of ambulation with walker.    Complex chronic pain syndrome  Lumbar spinal stenosis with sciatica  Continued on home Buprenorphine-naloxone 8-2TID - confirmed on PDMP; pt also received PRN suboxone for acute breakthrough pain as above. Received Gabapentin 600mg  TID, Methocarbamol 750mg  TID AC, naproxen 250BID. These meds continued on discharge with exception of PRN suboxone. Pt may benefit from chronic pain  clinic.    Agitation  Suicidal ideation  Hallucinations  Schizoaffective disorder, bipolar type  PTSD  Anxiety  Grief reaction  History of suicide attempts  Initially admitted to psych service for SI which resolved prior to transfer to medicine floor. Did not require 1:1 observation on medicine floor. Psychiatry continued to evaluate and treat patient while admitted to medicine.  Pt had some persistent visual hallucinations that required increase in loxapine dose, discharged on 35BID.  Pt had a couple of brief episodes of agitation/agression towards staff, as well as using racial slurs. She was able to be redirected without pharmacologic intervention.  Current regimen on discharge:  Prazosin 3mg  nightly, Fluoxetine 20mg  daily, Lorazepam BID prn, Loxapine 35mg  BID, Quetiapine 50mg  nightly, Oxcarbazepine 150mg  BID    Substance use disorder with UDS + cocaine, fentanyl on 11/24/2021  History of alcohol withdrawal, alcohol use disorder  h/o housing instability  No withdrawal. Continued home Acamprosate,andBuprenorphine-naloxone for chronic pain as above. SW and recovery coach consulted this admission. Pt's [facility] team was contacted and various team meetings held to find patient safe place to go on discharge as pt did not want to return to prior group home where she had been residing- the team was active in finding a place up until discharge though without success. Pt opted to be discharged to the street today as felt had access to safe place to stay (e.g. friend's place, shelter).    Chronic HFpEF (EF 70%, no regional WMA, TTE 12/07/2021)  10/2021 nuclear perfusion test w/out evidence of ischemia. Etiology of HF unclear (though possibly alcohol + cocaine use contributory?)  Few recent admissions over last 1-2 years for acute decompensated heart failure, per pt in setting of not taking home diuretics. Repeat ECHO this admission WNL EF 70%. Pt had intermittent O2 requirement overnights and chronic sx of orthopnea, though on  room air during days, OSA/OHS likely contributory. Significant pitting edema LE b/l likely venous stasis contributing. Unable to obtain accurate standing weight this admission. Torsemide was briefly increased to 120 BID this admission as initially had mild bibasilar crackles which resolved with this increase. Pt then soon after developed creatinine bump and was returned to 100 BID as felt to be euvolemic though exam and sx confounding/challenging as above. Likely was slightly overloaded from lack of taking torsemide prior to admission. Encouraged compression stockings on d/c.    Obesity, concern for OHS  Obstructive Sleep Apnea  Evaluated by pulmonology, felt clinically high suspicion for OSAA, OHS. Pt will benefit from outpatient sleep study.  Reportedly had normal PFTs in pass per pt though no record of this in chart. Is on Symbicort and albuterol which are likely not needed, suspect rep sx more so related to HFpEF, OSA/OHS- pt feels inhalers don't help much.    HLD:   Continued home Atorvastatin, low-dose aspirin (specific indication unclear)    Seizure disorder  Continued levetiracetam, gabapentin, oxcarbazepine    Tobacco use disorder:  Received transdermal nicotine and gum  as needed.    Hepatitis C, VL 0; self cleared  Hep C PCRnegative.     Hypothyroidism:  TSH normal on 11/24/2021. Continued home Levothyroxine    Gastroesophageal reflux disease:  Pantoprazole in place of home omeprazole    Discharge Exam:   BP 126/87   Pulse 114   Temp 97.9 F (36.6 C) (Oral)   Resp 20   Ht 5\' 9"  (1.753 m)   Wt (!) 165.8 kg (365 lb 9.6 oz)   SpO2 91%   BMI 53.99 kg/m   IRS:WNIOEVOJJKKXF, frustrated, no acute distress  CV:RRR, no MMGR  PULM:CTAB, decreased BS  GHW:EXHB, NTND  ZJI:RCVELFY edema 2+ to mid-shin b/l  BOF:BPZWCHENID without pain, L knee near full ROM  SKIN:No visible rash  NEURO: Alert and oriented- neuro exam limited by habitus  and pain, though grossly intact    Key labs, imaging, other tests:     Chemistries:  Recent Labs     12/16/21  1740 12/17/21  0745   NA 143 142   K 3.7 4.0   CO2 33* 32   BUN 20* 22*   CREAT 0.8 0.8   CA 9.9 10.0   MG  --  2.1   PHOS  --  4.4   ANION 13 13   GFR > 60 > 60    GI:  No results for input(s): AST, ALT, TBILI, DBILI, IBIL, ALKPHOS, GGTP, ALBUMIN, LIP in the last 72 hours.   CBC:  No results for input(s): WBC, HGB, HCT, PLTA in the last 72 hours. Coags:   No results for input(s): INR, PT, APTT, HPTT, FIB in the last 72 hours.   Trops, BNP, D-dimer, Lactate, C-RP, Procal, CK:  No results for input(s): TROPTHS, TROPTHSDELTA, PROBNP, DDPEDVT, DD, LACTICACID, CRP in the last 72 hours.    Invalid input(s):  PROCALCITONI,  CKTOTAL    Finger Sticks:  No results for input(s): FINGERSTICKR in the last 72 hours. Urinalysis:  No results for input(s): UACOL, UACLA, UAGLU, UABIL, UAKET, SPEGRAVURINE, UAOCC, UAPH, UAPRO, UABAC, UARBC, UANIT, LEUKOCYTES, UABAC, UASQE in the last 72 hours.    Invalid input(s): UAOBU,  UAWBC,  UAMIC       MRI L knee:  IMPRESSION:     Complex tear in the posterior horn of the lateral meniscus and radial tear in the body of the medial meniscus     Small right knee joint effusion and a small popliteal cyst. Diffuse edema of the subcutaneous tissues around the knee joint.     Mild degenerative changes of the knee with tricompartment narrowing and fraying of the the patellar cartilage.     No acute fractures of the left knee.          Discharge Medication List Noting Changes: Discharge Medication List as of 12/17/2021  4:14 PM    START taking these medications    Nicotine 10 MG/ML SOLN  1 Units by Nasal route every 8 (eight) hours as needed  for up to 5 days  Tamperproof, Disp-1 each, R-0    naloxone (NARCAN) 4 MG/0.1ML spray  1 spray intranasally in 1 nostril, may repeat using a new device every 2-84mins in alternating nostrils until medical help arrives  Bedside Med Delivery, Disp-1 each,  R-0      CONTINUE these medications which have CHANGED    QUEtiapine (SEROQUEL) 50 MG tablet  Take 1 tablet by mouth at bedtime  Bedside Med Delivery, Disp-30 tablet, R-0    EPINEPHrine (EPIPEN JR  1:2000) 0.15 MG/0.15ML injection  Inject 0.15 mg into the muscle once  for 1 dose  Bedside Med Delivery, Disp-1 each, R-0    atorvastatin (LIPITOR) 80 MG tablet  Take 1 tablet by mouth in the morning.  Bedside Med Delivery, Disp-30 tablet, R-0    buprenorphine-naloxone (SUBOXONE) 8-2 MG sublingual tablet  Place 1 tablet under the tongue in the morning and 1 tablet at noon and 1 tablet before bedtime. Max Daily Amount: 3 tablets. Do all this for 7 days.  Bedside Med Delivery, Disp-21 tablet, R-0    FLUoxetine (PROZAC) 20 MG capsule  Take 1 capsule by mouth in the morning.  Bedside Med Delivery, Disp-30 capsule, R-0  Ok to dispense capsules or tablets.      gabapentin (NEURONTIN) 300 MG capsule  Take 2 capsules by mouth in the morning and 2 capsules at noon and 2 capsules before bedtime.  Bedside Med Delivery, Disp-180 capsule, R-0    levETIRAcetam (KEPPRA) 750 MG tablet  Take 1 tablet by mouth in the morning and 1 tablet before bedtime.  Bedside Med Delivery, Disp-60 tablet, R-0    levothyroxine (SYNTHROID) 25 MCG tablet  Take 1 tablet by mouth every morning before breakfast  Bedside Med Delivery, Disp-30 tablet, R-0    !! loxapine (LOXITANE) 25 MG capsule  Take 1 capsule by mouth in the morning and 1 capsule before bedtime.  Bedside Med Delivery, Disp-60 capsule, R-0    LORazepam (ATIVAN) 1 MG tablet  Take 1 tablet by mouth 2 (two) times daily as needed for Anxiety  for up to 7 days  Bedside Med Delivery, Disp-14 tablet, R-0    !! loxapine (LOXITANE) 10 MG capsule  Take 1 capsule by mouth in the morning and 1 capsule before bedtime.  Bedside Med Delivery, Disp-60 capsule, R-0    nicotine (NICODERM CQ) 21 MG/24HR  Place 1 patch onto the skin in the morning.  Bedside Med Delivery, Disp-30 patch, R-0    nicotine polacrilex  (NICORETTE) 2 MG gum  Take 1 each by mouth every hour as needed  Bedside Med Delivery, Disp-100 each, R-1    OXcarbazepine (TRILEPTAL) 150 MG tablet  Take 1 tablet by mouth in the morning and 1 tablet before bedtime.  Bedside Med Delivery, Disp-60 tablet, R-0    prazosin (MINIPRESS) 1 MG capsule  Take 3 capsules by mouth nightly  Bedside Med Delivery, Disp-90 capsule, R-0    torsemide (DEMADEX) 100 MG tablet  Take 1 tablet by mouth in the morning and 1 tablet before bedtime.  Bedside Med Delivery, Disp-60 tablet, R-0    !! - Potential duplicate medications found. Please discuss with provider.      CONTINUE these medications which have NOT CHANGED    aspirin 81 MG chewable tablet  Take 81 mg by mouth in the morning.  Historical    budesonide-formoterol (SYMBICORT) 160-4.5 MCG/ACT inhaler  Inhale 2 puffs into the lungs 2 (two) times daily  Historical    diclofenac (VOLTAREN) 1 % GEL Gel  Apply 4 g topically in the morning and 4 g at noon and 4 g in the evening and 4 g before bedtime.  Historical    melatonin 3 MG TABS tablet  Take 3 mg by mouth nightly  Historical    naproxen (NAPROSYN) 250 MG tablet  Take 250 mg by mouth in the morning and 250 mg in the evening. Take with meals.  Historical    sennosides (SENOKOT) 8.6 MG tablet  Take 8.6 mg by  mouth in the morning.  Historical    thiamine (VITAMIN B-1) 100 mg tablet  Take 100 mg by mouth in the morning.  Historical    acamprosate (CAMPRAL) 333 MG tablet  Take 666 mg by mouth in the morning and 666 mg at noon and 666 mg before bedtime.  Historical    albuterol HFA 108 (90 Base) MCG/ACT inhaler  Inhale 2 puffs into the lungs every 6 (six) hours as needed for Wheezing  Historical    docusate sodium (COLACE) 100 MG capsule  Take 100 mg by mouth daily as needed for Constipation  Historical    Loratadine 10 MG CAPS  Take 10 mg by mouth daily  Historical    methocarbamol (ROBAXIN) 750 MG tablet  Take 750 mg by mouth in the morning and 750 mg at noon and 750 mg before  bedtime.  Historical    nystatin (MYCOSTATIN) cream  Apply topically 2 (two) times daily  Historical    nystatin (MYCOSTATIN) powder  Apply topically 2 (two) times daily  Historical    omeprazole (PRILOSEC) 40 MG capsule  Take 40 mg by mouth in the morning.  Historical    ondansetron (ZOFRAN-ODT) 8 MG disintegrating tablet  Take 8 mg by mouth every 8 (eight) hours as needed for Nausea  Historical      STOP taking these medications    DULoxetine 40 MG CPEP  Comments:  Reason for Stopping:    haloperidol (HALDOL) 5 MG tablet  Comments:  Reason for Stopping:    ferrous sulfate 324 (65 Fe) MG TBEC EC tablet  Comments:  Reason for Stopping:    fluticasone (FLONASE) 50 MCG/ACT nasal spray  Comments:  Reason for Stopping:    ferrous gluconate (FERGON) 324 MG tablet  Comments:  Reason for Stopping:    loratadine (CLARITIN) 10 MG tablet  Comments:  Reason for Stopping:    potassium chloride SA (K-DUR) 20 MEQ tablet  Comments:  Reason for Stopping:    ferrous sulfate 324 (65 Fe) MG TBEC EC tablet  Comments:  Reason for Stopping:    fluticasone (FLONASE) 50 MCG/ACT nasal spray  Comments:  Reason for Stopping:          Pending Laboratory Work and Tests: n/a      Weight bearing status: ambulatory w/ walker  Diet: cardiac diiet  Wound Care: n/a    Discharge Destination: street/shelter      Future appointments:   Current and Future Appointments at [facility] (90 Days) 12/18/2021 - 03/18/2022      Date Visit Type Length Department Provider     12/21/2021  1:15 PM Gaylord Shih Mliss Sax 15 15 min [facility] [[id number]] Caswell Corwin, MD    Patient Instructions:                      Follow-up Information    None         To reach medical records at any time call the [facility] operator at [phone]     To reach the covering hospitalist, page [phone]  To reach the [facility] for pending results, call [phone]    Signed:  [name], MD, 12/18/2021   PGY1, Internal  Medicine    ATTENDING ADDENDUM    This patient was seen and discussed with housestaff and nursing. Data independently reviewed, patient examined. I agree with housestaff note with changes made directly to the note above.  Mechanical Fall  Knee sprain  Left lateral and medial meniscal tears, chronic  Complex chronic pain syndrome  SI  Schizoaffective disorder  Hallucinations  Polysubstance use  Housing instability  HFpEF  Obesity  OSA, suspected    Patient left prior to bedside medications and declined to wait in the hospital for a safe discharge plan to respite.  Patient did display decisional capacity (please see my event note).  She was provided with Dr. Melanee Spry [name]'s primary care clinic phone number and strict return precautions.    >50% of this 60 minute encounter were spent on care coordination, counseling, and face to face with patient and family.     Inpatient level of care anticipated due to severity of illness and comorbidities. Discharged to shelter vs. Stre[facility], per patient preference.    Deberah Pelton, MD, MPH  Hospitalist  Pager [phone]

## 2021-12-18 NOTE — Narrator Note (Signed)
4:1 safety watch in place    Belongings secured in 4 bags in back room of the nurses station in the B section    Walker with name tag on it.

## 2021-12-18 NOTE — Narrator Note (Signed)
ROUNDING NOTE: Patient continues sleeping respirations equal and unlabored, SAT monitoring in place, safety watch continues, will monitor

## 2021-12-18 NOTE — ED Triage Note (Signed)
Pt biba, pt SI " I dont want to live anymore"   " my son was murder 1 year ago and I dont want to do this anymore"    Pt endorsees HI " I'm just going to hurt someone"   When asked if anyone particular, " No just anyone"   Pt left group home [name] ~ 2 weeks ago and homeless.   Pt has not been taking her home meds since discharge   Pt complaint of bilat leg swelling

## 2021-12-18 NOTE — Narrator Note (Signed)
Patient sleeping with respirations equal and unlabored, SAT monitor in place, safety watch in place, will continue to monitor

## 2021-12-18 NOTE — ED Notes (Signed)
Bed: 23-A  Expected date:   Expected time:   Means of arrival:   Comments:  EMS - SI/HI

## 2021-12-19 ENCOUNTER — Encounter (HOSPITAL_BASED_OUTPATIENT_CLINIC_OR_DEPARTMENT_OTHER): Payer: Self-pay

## 2021-12-19 MED ORDER — PRAZOSIN HCL 1 MG PO CAPS
3.0000 mg | ORAL_CAPSULE | Freq: Every evening | ORAL | Status: DC
Start: 2021-12-19 — End: 2021-12-22
  Administered 2021-12-19 – 2021-12-21 (×3): 3 mg via ORAL
  Filled 2021-12-19 (×3): qty 3

## 2021-12-19 MED ORDER — OXCARBAZEPINE 150 MG PO TABS
150.0000 mg | ORAL_TABLET | Freq: Two times a day (BID) | ORAL | Status: DC
  Administered 2021-12-19 – 2021-12-22 (×7): 150 mg via ORAL
  Filled 2021-12-19 (×7): qty 1

## 2021-12-19 MED ORDER — LEVOTHYROXINE SODIUM 25 MCG PO TABS
25.0000 ug | ORAL_TABLET | Freq: Every morning | ORAL | Status: DC
  Administered 2021-12-20 – 2021-12-22 (×3): 25 ug via ORAL
  Filled 2021-12-19 (×3): qty 1

## 2021-12-19 MED ORDER — ONDANSETRON 4 MG PO TBDP
4.0000 mg | ORAL_TABLET | Freq: Three times a day (TID) | ORAL | Status: DC | PRN
Start: 2021-12-19 — End: 2021-12-22
  Administered 2021-12-20: 4 mg via ORAL
  Filled 2021-12-19: qty 1

## 2021-12-19 MED ORDER — ALBUTEROL SULFATE HFA 108 (90 BASE) MCG/ACT IN AERS
2.0000 | INHALATION_SPRAY | Freq: Four times a day (QID) | RESPIRATORY_TRACT | Status: DC | PRN
Start: 2021-12-19 — End: 2021-12-22

## 2021-12-19 MED ORDER — MELATONIN 3 MG PO TABS
3.0000 mg | ORAL_TABLET | Freq: Every evening | ORAL | Status: DC
Start: 2021-12-19 — End: 2021-12-22
  Administered 2021-12-19 – 2021-12-21 (×3): 3 mg via ORAL
  Filled 2021-12-19 (×4): qty 1

## 2021-12-19 MED ORDER — THIAMINE 100 MG PO TABS
100.0000 mg | ORAL_TABLET | Freq: Every day | ORAL | Status: DC
Start: 2021-12-19 — End: 2021-12-22
  Administered 2021-12-19 – 2021-12-22 (×4): 100 mg via ORAL
  Filled 2021-12-19 (×4): qty 1

## 2021-12-19 MED ORDER — NICOTINE 21 MG/24HR TD PT24
1.0000 | MEDICATED_PATCH | Freq: Every day | TRANSDERMAL | Status: DC
Start: 2021-12-19 — End: 2021-12-22
  Administered 2021-12-19 – 2021-12-22 (×4): 1 via TRANSDERMAL
  Filled 2021-12-19 (×4): qty 1

## 2021-12-19 MED ORDER — PANTOPRAZOLE SODIUM 40 MG PO TBEC
40.0000 mg | DELAYED_RELEASE_TABLET | Freq: Every day | ORAL | Status: DC
Start: 2021-12-19 — End: 2021-12-22
  Administered 2021-12-19 – 2021-12-22 (×4): 40 mg via ORAL
  Filled 2021-12-19 (×4): qty 1

## 2021-12-19 MED ORDER — LOXAPINE SUCCINATE 25 MG PO CAPS
25.0000 mg | ORAL_CAPSULE | Freq: Two times a day (BID) | ORAL | Status: DC
Start: 2021-12-19 — End: 2021-12-22
  Administered 2021-12-19 – 2021-12-22 (×7): 25 mg via ORAL
  Filled 2021-12-19 (×7): qty 1

## 2021-12-19 MED ORDER — NAPROXEN 250 MG PO TABS
ORAL_TABLET | ORAL | Status: AC
Start: 2021-12-19 — End: 2021-12-19
  Administered 2021-12-19: 250 mg
  Filled 2021-12-19: qty 1

## 2021-12-19 MED ORDER — QUETIAPINE FUMARATE 25 MG PO TABS
50.0000 mg | ORAL_TABLET | Freq: Every evening | ORAL | Status: DC
Start: 2021-12-19 — End: 2021-12-22
  Administered 2021-12-19 – 2021-12-21 (×3): 50 mg via ORAL
  Filled 2021-12-19 (×3): qty 2

## 2021-12-19 MED ORDER — LEVETIRACETAM 500 MG PO TABS
750.0000 mg | ORAL_TABLET | Freq: Two times a day (BID) | ORAL | Status: DC
  Administered 2021-12-19 – 2021-12-22 (×7): 750 mg via ORAL
  Filled 2021-12-19 (×7): qty 1

## 2021-12-19 MED ORDER — LORATADINE 10 MG PO TABS
10.0000 mg | ORAL_TABLET | Freq: Every day | ORAL | Status: DC
Start: 2021-12-19 — End: 2021-12-22
  Administered 2021-12-19 – 2021-12-22 (×4): 10 mg via ORAL
  Filled 2021-12-19 (×4): qty 1

## 2021-12-19 MED ORDER — DICLOFENAC SODIUM 1 % EX GEL
2.0000 g | Freq: Three times a day (TID) | CUTANEOUS | Status: DC | PRN
Start: 2021-12-19 — End: 2021-12-22
  Filled 2021-12-19: qty 50

## 2021-12-19 MED ORDER — TORSEMIDE 100 MG PO TABS
100.0000 mg | ORAL_TABLET | Freq: Two times a day (BID) | ORAL | Status: DC
Start: 2021-12-19 — End: 2021-12-20
  Administered 2021-12-19 – 2021-12-20 (×2): 100 mg via ORAL
  Filled 2021-12-19 (×2): qty 1

## 2021-12-19 MED ORDER — NICOTINE POLACRILEX 2 MG MT GUM
2.0000 mg | CHEWING_GUM | OROMUCOSAL | Status: DC | PRN
Start: 2021-12-19 — End: 2021-12-20
  Administered 2021-12-19 – 2021-12-20 (×5): 2 mg via ORAL
  Filled 2021-12-19 (×4): qty 1

## 2021-12-19 MED ORDER — ATORVASTATIN CALCIUM 40 MG PO TABS
80.0000 mg | ORAL_TABLET | Freq: Every day | ORAL | Status: DC
Start: 2021-12-19 — End: 2021-12-22
  Administered 2021-12-19 – 2021-12-22 (×4): 80 mg via ORAL
  Filled 2021-12-19 (×4): qty 2

## 2021-12-19 MED ORDER — DOCUSATE SODIUM 100 MG PO CAPS
100.0000 mg | ORAL_CAPSULE | Freq: Two times a day (BID) | ORAL | Status: DC | PRN
Start: 2021-12-19 — End: 2021-12-22

## 2021-12-19 MED ORDER — ASPIRIN 81 MG PO CHEW
81.0000 mg | CHEWABLE_TABLET | Freq: Every day | ORAL | Status: DC
Start: 2021-12-19 — End: 2021-12-22
  Administered 2021-12-19 – 2021-12-22 (×4): 81 mg via ORAL
  Filled 2021-12-19 (×4): qty 1

## 2021-12-19 MED ORDER — FLUOXETINE HCL 20 MG PO CAPS
20.0000 mg | ORAL_CAPSULE | Freq: Every day | ORAL | Status: DC
Start: 2021-12-19 — End: 2021-12-22
  Administered 2021-12-19 – 2021-12-22 (×4): 20 mg via ORAL
  Filled 2021-12-19 (×4): qty 1

## 2021-12-19 MED ORDER — SENNOSIDES 8.6 MG PO TABS
8.6000 mg | ORAL_TABLET | Freq: Every day | ORAL | Status: DC
Start: 2021-12-19 — End: 2021-12-22
  Administered 2021-12-19 – 2021-12-22 (×4): 8.6 mg via ORAL
  Filled 2021-12-19 (×4): qty 1

## 2021-12-19 MED ORDER — BUDESONIDE-FORMOTEROL FUMARATE 160-4.5 MCG/ACT IN AERO
2.0000 | INHALATION_SPRAY | Freq: Two times a day (BID) | RESPIRATORY_TRACT | Status: DC
Start: 2021-12-19 — End: 2021-12-22
  Administered 2021-12-20 – 2021-12-22 (×3): 2 via RESPIRATORY_TRACT
  Filled 2021-12-19 (×2): qty 6
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
## 2021-12-19 NOTE — Narrator Note (Signed)
Pt sleeping but awakens easily. Pain has improved per assessment.

## 2021-12-19 NOTE — Narrator Note (Signed)
Patient called into [name]

## 2021-12-19 NOTE — Narrator Note (Signed)
Pt requesting nicorette gum every hour. Pt states she received this at another hospital. Pt informed that nicorette will not be given every hour, patch was placed at 12:45 in addition to gum. Will monitor patient for any problems.
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
## 2021-12-19 NOTE — Narrator Note (Signed)
Pt continues to sleep, monitor vitals within normal limits. Home medication list reviewed. No other changes. 4:1 safety watch continues.

## 2021-12-19 NOTE — Narrator Note (Signed)
Pt belongings 1 bin cabinet D

## 2021-12-19 NOTE — Narrator Note (Signed)
Pt sleeping at this time. 4:1 safety watch continues.

## 2021-12-19 NOTE — Narrator Note (Signed)
Patient requesting meal box, provided to patient

## 2021-12-19 NOTE — Narrator Note (Signed)
Taking over pt's care   Pt resting in bed asked for sandwich which was provided

## 2021-12-19 NOTE — Narrator Note (Signed)
Report to Brittany RN

## 2021-12-19 NOTE — Narrator Note (Signed)
Report received from RN, checked on pt, pt comfortably sleeping on hospital bed, pt on 2L NC and on bedside monitor, no respiratory distress noted respirations unlabored chest rise symmetric, pt does have pitting edema in bil. LE did not wake pt up to ask questions, pt in hospital clothing with sitter in line of site belongings secured, wctm

## 2021-12-19 NOTE — ED Notes (Signed)
This patient was signed out to me at change of shift by my colleague.    Please see ED course below for updates on clinical course.     ED Course as of 12/19/21 2311   Sun Dec 19, 2021   2310 11:11 PM  This is a 51 year old female who presents with SI. Medically cleared, IPLOC, bedsearch.           No acute events this shift.       Diagnosis:  Suicidal ideation  Disposition:  Dispo pending at attending transition of care    Drema Balzarine, MD

## 2021-12-19 NOTE — Narrator Note (Signed)
Pt uses BSC independently, VS monitored, oxygen saturation sustained on 2L NC

## 2021-12-19 NOTE — Narrator Note (Signed)
Pt requesting Lasix medication. Med rec reviewed and given to provider.

## 2021-12-19 NOTE — ED Notes (Signed)
Triage Documentation     Melven Sartorius, RN 12/18/2021 18:17             Pt biba, pt SI " I dont want to live anymore"   " my son was murder 1 year ago and I dont want to do this anymore"    Pt endorsees HI " I'm just going to hurt someone"   When asked if anyone particular, " No just anyone"   Pt left group home [name] ~ 2 weeks ago and homeless.   Pt has not been taking her home meds since discharge   Pt complaint of bilat leg swelling                Addendum:  Patient received in signout at change of shift.  Select available records reviewed.       Plan at change of shift:  Patient s/p evaluation with plan for bed search.  Patient is medically clear, bed search in progress.    ED Course as of 12/19/21 1455   Sun Dec 19, 2021   1454 Assumed care from my colleague at shift change. Briefly, patient is a 51 year old female with SI. No medical contraindication for inpatient psychiatric placement. Evaluated by [facility] Crisis Team. Recommended for [facility] Bedsearch underway.COVID negative. On safety watch.             Patient will be signed out to my colleague at shift change with bed search continuing.         Impression:  Suicidal ideation    Condition: Stable    Disposition: Psych Bed Search      Shanda Bumps, MD

## 2021-12-19 NOTE — Narrator Note (Signed)
Nurse spoke with pharmacy in regards to pt's Torsemide not loaded in pyxis   Pharmacy to come and load medication   Pt eating lunch at this time

## 2021-12-19 NOTE — Narrator Note (Signed)
Resp down looking for pt's symbacort unable to find medication and pharmacy already left for the night

## 2021-12-19 NOTE — Narrator Note (Signed)
Report given to Tiffany RN 

## 2021-12-19 NOTE — ED Provider Notes (Signed)
[facility] Emergency Medicine Attending Note      The patient was seen primarily by me. ED nursing record was reviewed. Select prior records as available electronically through the Epic record were reviewed.    History of Present Illness:    Cheryl Zimmerman is a 51 year old female with a history of schizoaffective disorder and substance abuse presents emergency department with suicidal ideation and homicidal ideation.  She says she has a plan to put herself on the railroad tracks and is worried she might harm other people.  She is requesting her pain medications.  Recent admission at [facility] used? English Kaniya Trueheart   MRN: [id number]  PCP: None    Arrived to the ED by: [facility]    History provided by: Patient     Review of Systems:  As per HPI. All other systems were reviewed and are negative        Past Medical Hx:  Past Medical History:  schizo: Bipolar affective ([name])  No date: Chronic systolic CHF (congestive heart failure) ([name])  No date: Depression  depress: Schizo affective schizophrenia ([name])  No date: Substance abuse ([name]) Meds:  Current Facility-Administered Medications   Medication    methocarbamol (ROBAXIN) tablet 750 mg    naproxen (NAPROSYN) tablet 500 mg    buprenorphine-naloxone (SUBOXONE) 8-2 MG SL tablet 1 tablet    gabapentin (NEURONTIN) capsule 600 mg    LORazepam (ATIVAN) tablet 1 mg     Current Outpatient Medications   Medication Sig    Nicotine 10 MG/ML SOLN 1 Units by Nasal route every 8 (eight) hours as needed  for up to 5 days    aspirin 81 MG chewable tablet Take 81 mg by mouth in the morning.    budesonide-formoterol (SYMBICORT) 160-4.5 MCG/ACT inhaler Inhale 2 puffs into the lungs 2 (two) times daily    diclofenac (VOLTAREN) 1 % GEL Gel Apply 4 g topically in the morning and 4 g at noon and 4 g in the evening and 4 g before bedtime.    melatonin 3 MG TABS tablet Take 3 mg by mouth nightly    naproxen (NAPROSYN) 250 MG tablet Take 250  mg by mouth in the morning and 250 mg in the evening. Take with meals.    sennosides (SENOKOT) 8.6 MG tablet Take 8.6 mg by mouth in the morning.    thiamine (VITAMIN B-1) 100 mg tablet Take 100 mg by mouth in the morning.    acamprosate (CAMPRAL) 333 MG tablet Take 666 mg by mouth in the morning and 666 mg at noon and 666 mg before bedtime.    albuterol HFA 108 (90 Base) MCG/ACT inhaler Inhale 2 puffs into the lungs every 6 (six) hours as needed for Wheezing    docusate sodium (COLACE) 100 MG capsule Take 100 mg by mouth daily as needed for Constipation    Loratadine 10 MG CAPS Take 10 mg by mouth daily    methocarbamol (ROBAXIN) 750 MG tablet Take 750 mg by mouth in the morning and 750 mg at noon and 750 mg before bedtime.    nystatin (MYCOSTATIN) cream Apply topically 2 (two) times daily    nystatin (MYCOSTATIN) powder Apply topically 2 (two) times daily    omeprazole (PRILOSEC) 40 MG capsule Take 40 mg by mouth in the morning.    ondansetron (ZOFRAN-ODT) 8 MG disintegrating tablet Take 8 mg by mouth every 8 (  eight) hours as needed for Nausea    QUEtiapine (SEROQUEL) 50 MG tablet Take 1 tablet by mouth at bedtime    naloxone (NARCAN) 4 MG/0.1ML spray 1 spray intranasally in 1 nostril, may repeat using a new device every 2-483mins in alternating nostrils until medical help arrives    atorvastatin (LIPITOR) 80 MG tablet Take 1 tablet by mouth in the morning.    buprenorphine-naloxone (SUBOXONE) 8-2 MG sublingual tablet Place 1 tablet under the tongue in the morning and 1 tablet at noon and 1 tablet before bedtime. Max Daily Amount: 3 tablets. Do all this for 7 days.    FLUoxetine (PROZAC) 20 MG capsule Take 1 capsule by mouth in the morning.    gabapentin (NEURONTIN) 300 MG capsule Take 2 capsules by mouth in the morning and 2 capsules at noon and 2 capsules before bedtime.    levETIRAcetam (KEPPRA) 750 MG tablet Take 1 tablet by mouth in the morning and 1 tablet before bedtime.    levothyroxine  (SYNTHROID) 25 MCG tablet Take 1 tablet by mouth every morning before breakfast    loxapine (LOXITANE) 25 MG capsule Take 1 capsule by mouth in the morning and 1 capsule before bedtime.    LORazepam (ATIVAN) 1 MG tablet Take 1 tablet by mouth 2 (two) times daily as needed for Anxiety  for up to 7 days    loxapine (LOXITANE) 10 MG capsule Take 1 capsule by mouth in the morning and 1 capsule before bedtime.    nicotine (NICODERM CQ) 21 MG/24HR Place 1 patch onto the skin in the morning.    nicotine polacrilex (NICORETTE) 2 MG gum Take 1 each by mouth every hour as needed    OXcarbazepine (TRILEPTAL) 150 MG tablet Take 1 tablet by mouth in the morning and 1 tablet before bedtime.    prazosin (MINIPRESS) 1 MG capsule Take 3 capsules by mouth nightly    torsemide (DEMADEX) 100 MG tablet Take 1 tablet by mouth in the morning and 1 tablet before bedtime.         Past Surgical Hx:  No past surgical history on file. Allergies:  Review of Patient's Allergies indicates:   Bee venom               Anaphylaxis   Carbamazepine           Hives   Methylprednisolone      Dizziness, Drowsiness    Comment:Also believes syncope. Seems to tolerate             inhalational and epidural steroid.   Nutritional supplem*       Hydroxyzine             Nausea Only   Social Hx:  Social History    Tobacco Use      Smoking status: Every Day        Packs/day: 3.00        Years: 30.00        Pack years: 3290        Types: Cigarettes      Smokeless tobacco: Never    Alcohol use: Yes   Immunizations:  Immunization History   Administered Date(s) Administered    Covid-19 Vaccine (Moderna - Full Dose) 02/03/2020, 04/15/2020    INFLUENZA VIRUS TRI W/PRESV VACCINE 18/> YRS IM (PRIVATE) 06/02/2011        Physical Examination:    ED Triage Vitals [12/18/21 1809]   ED Triage Vitals Brief Group  Temp 98.2 F      Pulse 110      Resp 18      BP 140/90      SpO2 94 %      Pain Score 7         General: No acute distress,  Head: NCAT  Eyes: Anicteric,  EOMI  ENT: Oropharynx clear, MMM  Neck: Supple,   CV: RRR  Lungs: Unlabored, CTAB  Abdomen: Nondistended soft non tender  Extremities: Warm, well perfused, pulse intact  Neuro: Alert, follows commands  Psych: Normal mood, cooperative    Medications Given in the ED:    Medications   methocarbamol (ROBAXIN) tablet 750 mg (750 mg Oral Given 12/18/21 2014)   naproxen (NAPROSYN) tablet 500 mg (has no administration in time range)   buprenorphine-naloxone (SUBOXONE) 8-2 MG SL tablet 1 tablet (1 tablet Sublingual Given 12/18/21 2013)   gabapentin (NEURONTIN) capsule 600 mg (600 mg Oral Given 12/18/21 2013)   LORazepam (ATIVAN) tablet 1 mg (1 mg Oral Given 12/18/21 2013)   ketorolac (TORADOL) injection 30 mg (30 mg Intramuscular Given 12/18/21 2028)    Radiology and ECG:    No orders to display        Lab Results:    Labs Reviewed   CBC, PLATELET & DIFFERENTIAL - Abnormal; Notable for the following components:       Result Value    HEMATOCRIT 47.4 (*)     RBC DISTRIBUTION WIDTH STD DEV 51.0 (*)     All other components within normal limits   URINE DRUG SCREEN - Abnormal; Notable for the following components:    COCAINE METABOLITES URINE POSITIVE (*)     CANNABINOIDS URINE POSITIVE (*)     BUPRENORPHINE SCREEN URINE POSITIVE (*)     SPECIMEN VALIDITY URINE PH 8.6 (*)     All other components within normal limits    Narrative:     URINE&Urine   BASIC METABOLIC PANEL   SERUM DRUG SCREEN   COVID-19 INPATIENT    Vital Signs:     12/18/21  1809 12/18/21  2013   BP: 140/90    Pulse: 110    Resp: 18 16   Temp: 98.2 F    SpO2: 94%           ED Course and Medical Decision Making:    50 year oldfemale presenting with SI/HI.  Patient is medically cleared, passed off to my colleague with behavioral health evaluation pending         Patient/family educated on their diagnosis, she verbalizes understanding and agrees with plan of care.    ED Disposition:     Impression(s):  Suicidal ideation    Disposition: Pending    ED Prescriptions:       Medication List      ASK your doctor about these medications    acamprosate 333 MG tablet  Commonly known as: CAMPRAL     albuterol HFA 108 (90 Base) MCG/ACT inhaler     aspirin 81 MG chewable tablet     atorvastatin 80 MG tablet  Commonly known as: LIPITOR  Take 1 tablet by mouth in the morning.     budesonide-formoterol 160-4.5 MCG/ACT inhaler  Commonly known as: SYMBICORT     buprenorphine-naloxone 8-2 MG sublingual tablet  Commonly known as: SUBOXONE  Place 1 tablet under the tongue in the morning and 1 tablet at noon and 1 tablet before bedtime. Max Daily Amount: 3 tablets. Do all this  for 7 days.     docusate sodium 100 MG capsule  Commonly known as: COLACE     EPINEPHrine 0.15 MG/0.15ML injection  Commonly known as: EPIPEN JR 1:2000  Inject 0.15 mg into the muscle once  for 1 dose  Ask about: Should I take this medication?     FLUoxetine 20 MG capsule  Commonly known as: PROzac  Take 1 capsule by mouth in the morning.     gabapentin 300 MG capsule  Commonly known as: NEURONTIN  Take 2 capsules by mouth in the morning and 2 capsules at noon and 2 capsules before bedtime.     levETIRAcetam 750 MG tablet  Commonly known as: KEPPRA  Take 1 tablet by mouth in the morning and 1 tablet before bedtime.     levothyroxine 25 MCG tablet  Commonly known as: SYNTHROID  Take 1 tablet by mouth every morning before breakfast     Loratadine 10 MG Caps     LORazepam 1 MG tablet  Commonly known as: ATIVAN  Take 1 tablet by mouth 2 (two) times daily as needed for Anxiety  for up to 7 days     * loxapine 25 MG capsule  Commonly known as: LOXITANE  Take 1 capsule by mouth in the morning and 1 capsule before bedtime.     * loxapine 10 MG capsule  Commonly known as: LOXITANE  Take 1 capsule by mouth in the morning and 1 capsule before bedtime.     melatonin 3 MG Tabs tablet     methocarbamol 750 MG tablet  Commonly known as: ROBAXIN     naloxone 4 MG/0.1ML spray  Commonly known as: NARCAN  1 spray intranasally in 1 nostril, may  repeat using a new device every 2-36mins in alternating nostrils until medical help arrives     naproxen 250 MG tablet  Commonly known as: NAPROSYN     * Nicotine 10 MG/ML Soln  1 Units by Nasal route every 8 (eight) hours as needed  for up to 5 days     * nicotine 21 MG/24HR  Commonly known as: NICODERM CQ  Place 1 patch onto the skin in the morning.     nicotine polacrilex 2 MG gum  Commonly known as: NICORETTE  Take 1 each by mouth every hour as needed     * nystatin cream  Commonly known as: MYCOSTATIN     * nystatin powder  Commonly known as: MYCOSTATIN     omeprazole 40 MG capsule  Commonly known as: PriLOSEC     ondansetron 8 MG disintegrating tablet  Commonly known as: ZOFRAN-ODT     OXcarbazepine 150 MG tablet  Commonly known as: TRILEPTAL  Take 1 tablet by mouth in the morning and 1 tablet before bedtime.     prazosin 1 MG capsule  Commonly known as: MINIPRESS  Take 3 capsules by mouth nightly     QUEtiapine 50 MG tablet  Commonly known as: SEROquel  Take 1 tablet by mouth at bedtime     sennosides 8.6 MG tablet  Commonly known as: SENOKOT     thiamine 100 mg tablet  Commonly known as: VITAMIN B-1     torsemide 100 MG tablet  Commonly known as: DEMADEX  Take 1 tablet by mouth in the morning and 1 tablet before bedtime.     Voltaren 1 % Gel Gel  Generic drug: diclofenac         * This list has 6 medication(s) that  are the same as other medications prescribed for you. Read the directions carefully, and ask your doctor or other care provider to review them with you.                      Tretha Sciara, MD  Attending Physician, Emergency Department  [facility]    This Emergency Department patient encounter note was created using voice-recognition software. Please excuse any typographical errors that have not yet been reviewed and corrected.

## 2021-12-20 ENCOUNTER — Encounter (HOSPITAL_BASED_OUTPATIENT_CLINIC_OR_DEPARTMENT_OTHER): Payer: Self-pay | Admitting: Family Medicine

## 2021-12-20 DIAGNOSIS — G4700 Insomnia, unspecified: Secondary | ICD-10-CM | POA: Diagnosis not present

## 2021-12-20 DIAGNOSIS — E119 Type 2 diabetes mellitus without complications: Secondary | ICD-10-CM | POA: Diagnosis not present

## 2021-12-20 DIAGNOSIS — F1990 Other psychoactive substance use, unspecified, uncomplicated: Secondary | ICD-10-CM

## 2021-12-20 DIAGNOSIS — M549 Dorsalgia, unspecified: Secondary | ICD-10-CM

## 2021-12-20 DIAGNOSIS — F17200 Nicotine dependence, unspecified, uncomplicated: Secondary | ICD-10-CM | POA: Diagnosis not present

## 2021-12-20 DIAGNOSIS — M23201 Derangement of unspecified lateral meniscus due to old tear or injury, left knee: Secondary | ICD-10-CM | POA: Diagnosis present

## 2021-12-20 DIAGNOSIS — J449 Chronic obstructive pulmonary disease, unspecified: Secondary | ICD-10-CM | POA: Diagnosis not present

## 2021-12-20 DIAGNOSIS — I5030 Unspecified diastolic (congestive) heart failure: Secondary | ICD-10-CM | POA: Diagnosis not present

## 2021-12-20 DIAGNOSIS — R911 Solitary pulmonary nodule: Secondary | ICD-10-CM | POA: Diagnosis not present

## 2021-12-20 DIAGNOSIS — M25562 Pain in left knee: Secondary | ICD-10-CM | POA: Diagnosis not present

## 2021-12-20 DIAGNOSIS — F1190 Opioid use, unspecified, uncomplicated: Secondary | ICD-10-CM | POA: Diagnosis present

## 2021-12-20 DIAGNOSIS — F1290 Cannabis use, unspecified, uncomplicated: Secondary | ICD-10-CM | POA: Diagnosis present

## 2021-12-20 DIAGNOSIS — M48061 Spinal stenosis, lumbar 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 claudication: Secondary | ICD-10-CM

## 2021-12-20 DIAGNOSIS — G894 Chronic pain syndrome: Secondary | ICD-10-CM | POA: Diagnosis present

## 2021-12-20 DIAGNOSIS — G4733 Obstructive sleep apnea (adult) (pediatric): Secondary | ICD-10-CM | POA: Diagnosis not present

## 2021-12-20 DIAGNOSIS — J441 Chronic obstructive pulmonary disease with (acute) exacerbation: Secondary | ICD-10-CM | POA: Diagnosis not present

## 2021-12-20 DIAGNOSIS — F1010 Alcohol abuse, uncomplicated: Secondary | ICD-10-CM | POA: Diagnosis not present

## 2021-12-20 DIAGNOSIS — E662 Morbid (severe) obesity with alveolar hypoventilation: Secondary | ICD-10-CM | POA: Diagnosis present

## 2021-12-20 DIAGNOSIS — M5430 Sciatica, unspecified side: Secondary | ICD-10-CM | POA: Diagnosis not present

## 2021-12-20 DIAGNOSIS — I2510 Atherosclerotic heart disease of native coronary artery without angina pectoris: Secondary | ICD-10-CM | POA: Diagnosis not present

## 2021-12-20 DIAGNOSIS — R609 Edema, unspecified: Secondary | ICD-10-CM | POA: Diagnosis not present

## 2021-12-20 DIAGNOSIS — F1090 Alcohol use, unspecified, uncomplicated: Secondary | ICD-10-CM | POA: Insufficient documentation

## 2021-12-20 DIAGNOSIS — R600 Localized edema: Secondary | ICD-10-CM | POA: Diagnosis not present

## 2021-12-20 DIAGNOSIS — R45850 Homicidal ideations: Secondary | ICD-10-CM | POA: Diagnosis present

## 2021-12-20 DIAGNOSIS — Z5900 Homelessness unspecified: Secondary | ICD-10-CM | POA: Diagnosis not present

## 2021-12-20 DIAGNOSIS — E039 Hypothyroidism, unspecified: Secondary | ICD-10-CM | POA: Diagnosis present

## 2021-12-20 DIAGNOSIS — R2689 Other abnormalities of gait and mobility: Secondary | ICD-10-CM | POA: Diagnosis not present

## 2021-12-20 DIAGNOSIS — G8929 Other chronic pain: Secondary | ICD-10-CM | POA: Diagnosis not present

## 2021-12-20 DIAGNOSIS — F1490 Cocaine use, unspecified, uncomplicated: Secondary | ICD-10-CM | POA: Diagnosis present

## 2021-12-20 DIAGNOSIS — E785 Hyperlipidemia, unspecified: Secondary | ICD-10-CM | POA: Diagnosis not present

## 2021-12-20 DIAGNOSIS — R45851 Suicidal ideations: Secondary | ICD-10-CM | POA: Diagnosis present

## 2021-12-20 DIAGNOSIS — F603 Borderline personality disorder: Secondary | ICD-10-CM | POA: Insufficient documentation

## 2021-12-20 DIAGNOSIS — R0600 Dyspnea, unspecified: Secondary | ICD-10-CM | POA: Diagnosis not present

## 2021-12-20 DIAGNOSIS — F333 Major depressive disorder, recurrent, severe with psychotic symptoms: Secondary | ICD-10-CM | POA: Diagnosis present

## 2021-12-20 DIAGNOSIS — I5032 Chronic diastolic (congestive) heart failure: Secondary | ICD-10-CM | POA: Diagnosis not present

## 2021-12-20 DIAGNOSIS — I5022 Chronic systolic (congestive) heart failure: Secondary | ICD-10-CM | POA: Diagnosis not present

## 2021-12-20 DIAGNOSIS — R109 Unspecified abdominal pain: Secondary | ICD-10-CM | POA: Diagnosis not present

## 2021-12-20 DIAGNOSIS — F4310 Post-traumatic stress disorder, unspecified: Secondary | ICD-10-CM | POA: Diagnosis present

## 2021-12-20 DIAGNOSIS — G40909 Epilepsy, unspecified, not intractable, without status epilepticus: Secondary | ICD-10-CM | POA: Diagnosis present

## 2021-12-20 DIAGNOSIS — F332 Major depressive disorder, recurrent severe without psychotic features: Secondary | ICD-10-CM | POA: Diagnosis not present

## 2021-12-20 DIAGNOSIS — K219 Gastro-esophageal reflux disease without esophagitis: Secondary | ICD-10-CM | POA: Diagnosis not present

## 2021-12-20 DIAGNOSIS — F17210 Nicotine dependence, cigarettes, uncomplicated: Secondary | ICD-10-CM | POA: Diagnosis present

## 2021-12-20 DIAGNOSIS — I5031 Acute diastolic (congestive) heart failure: Secondary | ICD-10-CM | POA: Diagnosis present

## 2021-12-20 DIAGNOSIS — M23204 Derangement of unspecified medial meniscus due to old tear or injury, left knee: Secondary | ICD-10-CM | POA: Diagnosis present

## 2021-12-20 LAB — BASIC METABOLIC PANEL
ANION GAP: 11 mmol/L (ref 10–22)
BUN (UREA NITROGEN): 13 mg/dL (ref 7–18)
CALCIUM: 9.4 mg/dL (ref 8.5–10.5)
CARBON DIOXIDE: 30 mmol/L (ref 21–32)
CHLORIDE: 101 mmol/L (ref 98–107)
CREATININE: 0.7 mg/dL (ref 0.4–1.2)
ESTIMATED GLOMERULAR FILT RATE: >60 mL/min (ref >60)
Glucose Random: 114 mg/dL (ref 74–160)
POTASSIUM: 3.9 mmol/L (ref 3.5–5.1)
SODIUM: 142 mmol/L (ref 136–145)

## 2021-12-20 LAB — SERUM DRUG SCREEN
ACETAMINOPHEN: <5 ug/mL (ref 10–30)
ETHANOL: <10 mg/dL (ref 0–10)
SALICYLATE: <0.5 mg/dL (ref 3.0–20.0)

## 2021-12-20 LAB — NT-PROBNP: NT-proBNP: 665 pg/mL — ABNORMAL HIGH (ref 0–125)

## 2021-12-20 MED ORDER — ACETAMINOPHEN 325 MG PO TABS
650.0000 mg | ORAL_TABLET | Freq: Four times a day (QID) | ORAL | Status: DC | PRN
Start: 2021-12-20 — End: 2021-12-22
  Administered 2021-12-21 – 2021-12-22 (×4): 650 mg via ORAL
  Filled 2021-12-20 (×5): qty 2

## 2021-12-20 MED ORDER — LIDOCAINE 4 % EX PTCH
1.0000 | MEDICATED_PATCH | CUTANEOUS | Status: DC
Start: 2021-12-20 — End: 2021-12-22
  Administered 2021-12-20: 1 via TOPICAL
  Filled 2021-12-20 (×2): qty 1

## 2021-12-20 MED ORDER — ALUMINUM & MAGNESIUM HYDROXIDE 200-200 MG/5ML PO SUSP
30.00 mL | Freq: Once | ORAL | Status: AC
Start: 2021-12-20 — End: 2021-12-20
  Administered 2021-12-20: 30 mL via ORAL
  Filled 2021-12-20: qty 30

## 2021-12-20 MED ORDER — LIDOCAINE VISCOUS HCL 2 % MT SOLN
10.00 mL | Freq: Once | OROMUCOSAL | Status: AC
Start: 2021-12-20 — End: 2021-12-20
  Administered 2021-12-20: 10 mL via ORAL
  Filled 2021-12-20: qty 15

## 2021-12-20 MED ORDER — HEPARIN SODIUM (PORCINE) 5000 UNIT/ML IJ SOLN
5000.0000 [IU] | Freq: Two times a day (BID) | INTRAMUSCULAR | Status: DC
Start: 2021-12-20 — End: 2021-12-21
  Administered 2021-12-20: 5000 [IU] via SUBCUTANEOUS
  Filled 2021-12-20 (×2): qty 1

## 2021-12-20 MED ORDER — TORSEMIDE 20 MG PO TABS
120.0000 mg | ORAL_TABLET | Freq: Two times a day (BID) | ORAL | Status: DC
Start: 2021-12-20 — End: 2021-12-22
  Administered 2021-12-20: 120 mg via ORAL
  Administered 2021-12-21: 20 mg via ORAL
  Administered 2021-12-21 – 2021-12-22 (×3): 120 mg via ORAL
  Filled 2021-12-20 (×5): qty 1

## 2021-12-20 MED ORDER — NICOTINE POLACRILEX 2 MG MT GUM
4.0000 mg | CHEWING_GUM | OROMUCOSAL | Status: DC | PRN
Start: 2021-12-20 — End: 2021-12-22
  Administered 2021-12-20 – 2021-12-22 (×9): 4 mg via ORAL
  Filled 2021-12-20 (×10): qty 2

## 2021-12-20 NOTE — H&P (Addendum)
H&P NOTE     Chief Complaint:   Patient presents with:  Suicidal Ideation      History of Present Illness: Cheryl Zimmerman is a 51F w/ PMHx HFpEF, COPD/asthma, OSA, OHS, hypothyroidism, GERD, seizure disorder, HLD, polysubstance use (cocaine, fentanyl, EtOH, heavy tobacco smoke), complex chronic pain syndrome, lumbar spinal stenosis w/ sciatica, chronic L lateral/medial meniscal tears, morbid obesity, schizoaffective disorder, bipolar disorder, PTSD, multiple suicide attempts, unstable housing, and recent hospitalization ~3/7 - 3/31 for SI, presented to ED on 4/1 for SI/HI, admitted on 4/2 to medical floor to board awaiting inpatient psych bed.     She c/o burning sensation in R back and in lungs. Admits to cocaine use PTA, remorseful. Left hospital recently due to nicotine cravings, requesting increased NRT. ~3 ppd smoker. Not urinating as much as she'd like, feels her urine has been dark despite drinking lots of water. Left the hospital feeling dyspneic with peripheral edema, both which she feels has worsened since. Did not pick up her meds from Rx so had been ~1 day without medication. Had laid on train tracks [place] until her friend called her who helped call EMS for her to return to the hospital.     I discussed the case with the ED provider. Awaiting psych bed placement. May have been an opening on W2 but no bed large enough for there yet. No behavioral issues in ED. Medically cleared.     Past Medical History:   Past Medical History:  schizo: Bipolar affective ([name])  No date: Chronic systolic CHF (congestive heart failure) ([name])  No date: Depression  depress: Schizo affective schizophrenia ([name])  No date: Substance abuse ([name])    Past Surgical History:   No past surgical history on file.    Medications prior to Admission:   Prior to Admission Medications   Prescriptions Last Dose Informant Patient Reported? Taking?   EPINEPHrine (EPIPEN JR 1:2000) 0.15 MG/0.15ML injection   No No   Sig: Inject 0.15 mg into the  muscle once  for 1 dose   FLUoxetine (PROZAC) 20 MG capsule   No No   Sig: Take 1 capsule by mouth in the morning.   LORazepam (ATIVAN) 1 MG tablet   No No   Sig: Take 1 tablet by mouth 2 (two) times daily as needed for Anxiety  for up to 7 days   Loratadine 10 MG CAPS   Yes No   Sig: Take 10 mg by mouth daily   Nicotine 10 MG/ML SOLN   No No   Sig: 1 Units by Nasal route every 8 (eight) hours as needed  for up to 5 days   OXcarbazepine (TRILEPTAL) 150 MG tablet   No No   Sig: Take 1 tablet by mouth in the morning and 1 tablet before bedtime.   QUEtiapine (SEROQUEL) 50 MG tablet   No No   Sig: Take 1 tablet by mouth at bedtime   acamprosate (CAMPRAL) 333 MG tablet   Yes No   Sig: Take 666 mg by mouth in the morning and 666 mg at noon and 666 mg before bedtime.   albuterol HFA 108 (90 Base) MCG/ACT inhaler   Yes No   Sig: Inhale 2 puffs into the lungs every 6 (six) hours as needed for Wheezing   aspirin 81 MG chewable tablet   Yes No   Sig: Take 81 mg by mouth in the morning.   atorvastatin (LIPITOR) 80 MG tablet   No No   Sig:  Take 1 tablet by mouth in the morning.   budesonide-formoterol (SYMBICORT) 160-4.5 MCG/ACT inhaler   Yes No   Sig: Inhale 2 puffs into the lungs 2 (two) times daily   buprenorphine-naloxone (SUBOXONE) 8-2 MG sublingual tablet   No No   Sig: Place 1 tablet under the tongue in the morning and 1 tablet at noon and 1 tablet before bedtime. Max Daily Amount: 3 tablets. Do all this for 7 days.   diclofenac (VOLTAREN) 1 % GEL Gel   Yes No   Sig: Apply 4 g topically in the morning and 4 g at noon and 4 g in the evening and 4 g before bedtime.   docusate sodium (COLACE) 100 MG capsule   Yes No   Sig: Take 100 mg by mouth daily as needed for Constipation   gabapentin (NEURONTIN) 300 MG capsule   No No   Sig: Take 2 capsules by mouth in the morning and 2 capsules at noon and 2 capsules before bedtime.   levETIRAcetam (KEPPRA) 750 MG tablet   No No   Sig: Take 1 tablet by mouth in the morning and 1 tablet  before bedtime.   levothyroxine (SYNTHROID) 25 MCG tablet   No No   Sig: Take 1 tablet by mouth every morning before breakfast   loxapine (LOXITANE) 10 MG capsule   No No   Sig: Take 1 capsule by mouth in the morning and 1 capsule before bedtime.   loxapine (LOXITANE) 25 MG capsule   No No   Sig: Take 1 capsule by mouth in the morning and 1 capsule before bedtime.   melatonin 3 MG TABS tablet   Yes No   Sig: Take 3 mg by mouth nightly   methocarbamol (ROBAXIN) 750 MG tablet   Yes No   Sig: Take 750 mg by mouth in the morning and 750 mg at noon and 750 mg before bedtime.   naloxone (NARCAN) 4 MG/0.1ML spray   No No   Sig: 1 spray intranasally in 1 nostril, may repeat using a new device every 2-573mins in alternating nostrils until medical help arrives   naproxen (NAPROSYN) 250 MG tablet   Yes No   Sig: Take 250 mg by mouth in the morning and 250 mg in the evening. Take with meals.   nicotine (NICODERM CQ) 21 MG/24HR   No No   Sig: Place 1 patch onto the skin in the morning.   nicotine polacrilex (NICORETTE) 2 MG gum   No No   Sig: Take 1 each by mouth every hour as needed   nystatin (MYCOSTATIN) cream   Yes No   Sig: Apply topically 2 (two) times daily   nystatin (MYCOSTATIN) powder   Yes No   Sig: Apply topically 2 (two) times daily   omeprazole (PRILOSEC) 40 MG capsule   Yes No   Sig: Take 40 mg by mouth in the morning.   ondansetron (ZOFRAN-ODT) 8 MG disintegrating tablet   Yes No   Sig: Take 8 mg by mouth every 8 (eight) hours as needed for Nausea   prazosin (MINIPRESS) 1 MG capsule   No No   Sig: Take 3 capsules by mouth nightly   sennosides (SENOKOT) 8.6 MG tablet   Yes No   Sig: Take 8.6 mg by mouth in the morning.   thiamine (VITAMIN B-1) 100 mg tablet   Yes No   Sig: Take 100 mg by mouth in the morning.   torsemide (DEMADEX) 100 MG tablet  No No   Sig: Take 1 tablet by mouth in the morning and 1 tablet before bedtime.      Facility-Administered Medications: None        Allergies:   Review of Patient's  Allergies indicates:   Bee venom               Anaphylaxis   Carbamazepine           Hives   Methylprednisolone      Dizziness, Drowsiness    Comment:Also believes syncope. Seems to tolerate             inhalational and epidural steroid.   Nutritional supplem*       Hydroxyzine             Nausea Only    Social History:  Tobacco Use: yes  Alcohol: yes  Living situation: unstable    Family History:   Mother and father have no hx of CAD    Review of Systems:   Review of Systems   Constitutional: Positive for malaise/fatigue. Negative for fever.   HENT: Negative for sore throat.    Eyes: Negative for pain.   Respiratory: Positive for shortness of breath. Negative for cough and wheezing.    Cardiovascular: Positive for leg swelling. Negative for chest pain.   Gastrointestinal: Negative for abdominal pain, diarrhea, nausea and vomiting.   Genitourinary: Negative for dysuria.   Musculoskeletal: Positive for back pain and myalgias.   Neurological: Negative for focal weakness and headaches.   Psychiatric/Behavioral: Positive for depression, hallucinations, substance abuse and suicidal ideas. The patient is nervous/anxious.      All other organ systems are reviewed and are negative.    Physical Exam:  Vital Signs - Last 8 Hours:   BP: (113-117)/(66-77)   Temp:  [97.5 F (36.4 C)-98.3 F (36.8 C)]   Pulse:  [83-88]   Resp:  [20]   SpO2:  [94 %-95 %]     GEN: NAD, laying in bed   EENT: EOMI, clear sclera  Lungs: CTAB, respirations unlabored  Heart: RRR  Abd: Soft, obese, non-tender, non-distended  Neuro: No focal deficits, alert  Skin: warm, dry  Cheryl: tenderness to light palpation of R flank (jumping from light touch but not when auscultating over same area, pressing in firmly with stethoscope)  Extremities: 3+ pitting b/l le  Psych: normal affect      Recent Labs:  BMP:   Recent Labs     12/18/21  1840   NA 142   K 3.9   CL 101   CO2 30   BUN 13   CREAT 0.7   GLUCOSER 114     Ca, Mg, Phos:   Recent Labs     12/18/21  1840    CA 9.4     CBC:   Recent Labs     12/18/21  1840   WBC 10.0   HCT 47.4*   PLTA 301   RBC 5.03     Coagulation Labs: No results for input(s): PT, INR in the last 72 hours.    Invalid input(s): PTT  LFTs: No results for input(s): AST, ALT, TBILI, DBILI, ALKPHOS in the last 72 hours.  Pancreatic Enzymes: No results for input(s): AMY, LIP in the last 72 hours.  Urinalysis:No results for input(s): UACOL, UACLA, UAGLU, UABIL, UAKET, SPEGRAVURINE, UAOCC, UAPH, UAPRO, UANIT, LEUKOCYTES in the last 72 hours.    Invalid input(s): UAOBU  Troponin: No results for input(s): TROPI in the  last 72 hours.    Imaging:     XR Chest Portable    Result Date: 11/24/2021  TECHNIQUE: Portable chest, 2:52 PM and repeat at 2:54 PM INDICATION: Hypoxia COMPARISON: 05/31/2011 FINDINGS: Quality: Satisfactory.   Tubes/lines: None. Lungs: There is a linear density consistent with atelectasis in the left base. There is overlying breast tissue obscuring a portion of the right base. No definite infiltrates are seen. Pleura: There is no pleural effusion or pneumothorax. Heart: The cardiac silhouette is unremarkable. Mediastinum/hila: Unremarkable. Bones and Soft Tissues: There are degenerative changes and scoliosis of the thoracic spine.   IMPRESSION: Bibasilar atelectasis, otherwise no acute cardiopulmonary findings.   Reviewed and Electronically Signed by: Lavella Lemons MD Signed Date/Time: 11-24-2021 15:11:59       XR Hip Left 1 view    Result Date: 12/06/2021  Exam: Left Hip, one view portable, 1:03 AM Indication: Left lower extremity numbness, tingling s/p mechanical fall earlier today Comparison: None Findings: Bones and Joints: There is no fracture in this single view. There is no dislocation. There is narrowing of left hip joint space. Soft Tissues: There is vascular calcification.  Impression: No visible fracture in this single view. If symptoms persist then a complete hip study is suggested.   Reviewed and Electronically Signed by:  Lavella Lemons MD Signed Date/Time: 12-06-2021 07:58:27       XR Knee Left 3 views    Result Date: 12/05/2021  Exam: Left knee, 4 views Indication: knee pain, recent fall Comparison: None Findings: Bones and Joints: There is no fracture or suspicious bone lesion. There is no dislocation. The joint spaces are maintained. No joint effusion is seen. Soft Tissues: Unremarkable.  Impression: Normal left knee radiographs.   Reviewed and Electronically Signed by: Lennie Hummer MD Signed Date/Time: 12-05-2021 19:42:47       MRI Knee Left WO Contrast    Result Date: 12/08/2021  MR Knee, Left CLINICAL INDICATION: Status post trauma, pain COMPARISON: X-ray 12/05/2021 TECHNIQUE: Ax PDfs, Sag T44fs, Sag PD, Sag DESS, Cor T67fs, Cor T1. The study is slightly degraded due to patient body habitus. FINDINGS: Menisci: Lateral meniscus: There is an tear in the posterior horn of the lateral meniscus extending to the inferior articular surface. Mild degeneration of the anterior horn of the lateral meniscus. Medial meniscus: There is a small radial tear in the body of the medial meniscus. Ligaments: Cruciate ligaments: Anterior and posterior cruciate ligaments are intact. Medial collateral ligament: Superficial and deep components intact. No periligamentous edema. Lateral collateral ligament complex: Intact. There is small amount of fluid adjacent to the lateral collateral ligament which may represent mild sprain.Marland Kitchen Posterolateral corner structures: Intact. Extensor mechanism: The distal quadriceps and patellar tendons are intact.  There is no retinacular disruption. Fluid: There is small joint effusion. There is a small popliteal cyst measuring 4.4 x 1.4 x 1.8 cm. Osseous and articular structures: Bones: There is mild marrow edema in the distal femur with a small subchondral cyst. There is a small osteochondral lesion in the. There is no marrow edema to suggest fracture. There are subchondral cystic changes at the lateral and medial facet of  the patella which may be related to degenerative changes. Patellofemoral compartment: There is patellofemoral joint space narrowing with fraying of the patellar cartilage along the lateral facet of the patella with question of a small cartilage defect. There is fraying and abnormal signal in the trochlear cartilage. Medial compartment: There is medial joint compartment narrowing with thinning of the cartilage. Lateral compartment:  There is mild lateral joint compartment narrowing with thinning of the cartilage. Other: There is diffuse soft tissue edema of the knee.Marland Kitchen IMPRESSION: Complex tear in the posterior horn of the lateral meniscus and radial tear in the body of the medial meniscus Small right knee joint effusion and a small popliteal cyst. Diffuse edema of the subcutaneous tissues around the knee joint. Mild degenerative changes of the knee with tricompartment narrowing and fraying of the the patellar cartilage. No acute fractures of the left knee.   Reviewed and Electronically Signed by: Harolyn Rutherford MD Signed Date/Time: 12-08-2021 20:33:51       CT Head WO Contrast    Result Date: 12/06/2021  CLINICAL INDICATION: Head trauma, abnormal mental status (Age 86-64y) COMPARISON: none TECHNIQUE: Contiguous scans from skull base to vertex without contrast and reconstructions in coronal plane. Radiation dose reduction techniques were employed. CTDIvol: 57.8 mGy. DLP: 1024 mGy-cm.  FINDINGS: Brain: Gray-white differentiation is maintained. There is no hemorrhage or abnormal mass effect. Ventricles and CSF spaces: Normal in size and morphology. Sinuses: There is mild mucoperiosteal thickening of the left frontal sinus. Mastoid air cells: Clear. Orbits: Unremarkable. Bones: The calvarial vault and skull base are intact.  Extracranial: Unremarkable IMPRESSION: No CT evidence of acute intracranial abnormality. Preliminary report provided at the time of the study by Dr. William Hamburger of [facility].   Reviewed and Electronically Signed by:  Vinetta Bergamo MD Signed Date/Time: 12-06-2021 41:32:44       Echocardiogram w/ Ultrasound Contrast    Result Date: 12/07/2021  Conclusions: (click link below for full report) 1. Left Ventricle: There is mild concentric hypertrophy. 2. Left Ventricle: Global systolic function: EF is estimated visually at 70% . 3. Left Ventricle: Regional systolic function: Wall motion: There are no regional wall motion abnormalities. 4. Right Ventricle: Not well imaged. Valves not well imaged. 5. Pericardium: There is no pericardial effusion.      Assessment and Plan:  5F w/ PMHx HFpEF, COPD/asthma, OSA, OHS, hypothyroidism, GERD, seizure disorder, HLD, polysubstance use (cocaine, fentanyl, EtOH, heavy tobacco smoke), complex chronic pain syndrome, lumbar spinal stenosis w/ sciatica, chronic L lateral/medial meniscal tears, morbid obesity, schizoaffective disorder, bipolar disorder, PTSD, multiple suicide attempts, unstable housing, and recent hospitalization ~3/7 - 3/31 for SI, presented to ED on 4/1 for SI/HI, admitted on 4/2 to medical floor to board awaiting inpatient psych bed.     #SI/HI  #h/o SA  -none on admission  -per pt, was laying [place] train tracks PTA  -suicide precautions including close 1:1 obs  -psych consult, appreciate recs   -awaiting inpatient psych bed    #Dyspnea on exertion  #Peripheral edema  #HFpEF (EF 70%, no WMA on TTE 12/07/21)  #OHS, OSA  #COPD/asthma  -feels dyspnea, peripheral edema worse since recent hospital discharge, missing about ~ 1d of medication  -possibly w/ mild CHF exacerbation from missed diuretic doses  -no e/o infection or copd exacerbation  -increase torsemide to 120 bid (similar to recent admission, requiring an increased dose briefly) (day team to follow-up daily to adjust dose prn. Had been discharged on torsemide 100 bid)  -I/Os, daily weights, vitals  -SpO2 goal 88-92, if SpO2 >90, does not need supplemental O2 (may need it only overnight 1-2L)  -admitted on ~2 L supplemental O2 w/ SpO2  >92 (baseline is closer to ~90 on chart review); on review of current presentation, lowest O2 on flow sheet was 91 on room air  -c/w ICS/LABA, SABA prn  -f/u nt-pro-bnp and repeat basic labs    #  burning sensation in lungs/right back  -suspect due to recent cocaine use (onset after hospital d/c, cocaine use)  -no concerning skin changes on back  -no concerning urinary sxs, f/u ua  -trial of gi cocktail and lidocaine patch    #complex chronic pain syndrome  #lumbar spinal stenosis w/ sciatica  #L knee pain, improved  #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  -had mechanical fall while on recent psych admission resulting in increased L knee pain and difficulty ambulating, requiring transfer to medicine  -evaluated by ortho who felt MRI L knee findings appeared chronic  -c/w tylenol, naproxen, suboxone, gabapentin, robaxin    #PSUD  -recovery coach consult  -uds + for marijuana, cocaine, suboxone  -c/w suboxone tid (takes also for chronic pain)  -cessation, NRT    #Seizure d/o  -c/w keppra, trileptal    #HLD  -c/w statin    #h/o HCV  -recent viral load undetectable, cleared on her own    #scizoaffective d/o  #bipolar d/o  #ptsd  -c/w psych meds    #housing instability  -social work consult    Diet- cardiac  DVT prophylaxis- sch  Med rec- complete w/ recent d/c med list  PCP- unclear; possibly Jimmye Norman, FNP  HCP- not on file  Code- full     Dispo: medically cleared for inpatient psych, pending psych bed availability    Adriana Simas, PA-C, 12/20/2021

## 2021-12-20 NOTE — Narrator Note (Signed)
Patient asleep in bed, equal rise and fall of chest, safety watch continues

## 2021-12-20 NOTE — ED Notes (Signed)
ED Course as of 12/20/21 0841   Mon Dec 20, 2021   0705 Signout: 13 F p/w SI. Medically cleared. IPLOC bedsearch. COVID neg.        Cindie Laroche  Attending Physician  Emergency Department  [facility]  [phone]

## 2021-12-20 NOTE — Narrator Note (Signed)
Pt resting in bed. Security watch in place

## 2021-12-20 NOTE — Narrator Note (Signed)
Patient asleep in bed, equal rise and fall of chest. Safety watch continues

## 2021-12-20 NOTE — Narrator Note (Addendum)
Patient asleep on bed, equal rise and fall of chest. Safety watch continues .

## 2021-12-20 NOTE — Plan of Care (Signed)
2000: Patient came to the floor from ED around 6pm. Patient is alert and oriented  x 3. Patient has been calm and cooperative with care. Pt was teary when she talks about her son. Reported she is very depressed but will not hurt herself. Room sweep done. Remain on 1:1 for safety. C/o of chronic back pain /left knee pain. Lidocaine patch on. Denies N/V. Good appetite. Patient on 2L oxygen, sating at 94% at rest.     Patient walked to the bathroom with rolling walker x 1 assist. Unsteady gait. RN helped clean her up. +2/+3 edema to BLE. On Torsemide. Lung sound clear diminished. Bruises to abdomen, noted small healing open wound to side of her left foot.     All belongings in the blue top bin infront of nurses station. Patient requested to have her journal and a pen. MD made aware. Provide marker. Administered prn ativan for anxiety. Took all medications.

## 2021-12-20 NOTE — Narrator Note (Signed)
Pt sitting up edge of bed watching show on personal phone   Security watch in place

## 2021-12-20 NOTE — Narrator Note (Signed)
Patient remains asleep in bed, equal rise and fall of chest. Safety watch continues.

## 2021-12-20 NOTE — Narrator Note (Signed)
Pt in room wailing and crying. Stated her dead son was in the room and scareing her. Pt reoriented and informed that there was no one else in the room.

## 2021-12-20 NOTE — Narrator Note (Signed)
Pt sitting up edge of bed watching show on personal phone   Security watch in place

## 2021-12-20 NOTE — ED Notes (Signed)
Patient was signed out to me by prior ED staff. Please refer to their documentation for full history and physical exam.     Patient was signed out to me pending inpatient bed search.  Patient presented with suicidal ideations.  She was observed during my shift without any issues.  I received a phone call from nursing manager stating that the patient should be admitted to medicine service as the patient be boarding in the emergency department for long period time she.  She spoke with Dr. Imogene Burn from hospitalist service who is in agreement.  Patient was admitted to medicine service in stable condition.

## 2021-12-20 NOTE — Narrator Note (Signed)
Breakfast tray provided. 

## 2021-12-20 NOTE — Narrator Note (Signed)
Pt resting in bed,
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
## 2021-12-20 NOTE — Narrator Note (Signed)
Pt in room at times yelling out.   Pt reports " I'm  Seeing my dead son in the room"  Supportive listening provided

## 2021-12-20 NOTE — Narrator Note (Signed)
Pt sleeping in bed.   resp even and unlabored.    3 side rails up and locked.   Safety watch in place

## 2021-12-21 ENCOUNTER — Telehealth (HOSPITAL_BASED_OUTPATIENT_CLINIC_OR_DEPARTMENT_OTHER): Payer: Self-pay

## 2021-12-21 ENCOUNTER — Inpatient Hospital Stay (HOSPITAL_BASED_OUTPATIENT_CLINIC_OR_DEPARTMENT_OTHER): Payer: No Typology Code available for payment source

## 2021-12-21 ENCOUNTER — Other Ambulatory Visit (HOSPITAL_BASED_OUTPATIENT_CLINIC_OR_DEPARTMENT_OTHER): Payer: Self-pay

## 2021-12-21 ENCOUNTER — Encounter (HOSPITAL_BASED_OUTPATIENT_CLINIC_OR_DEPARTMENT_OTHER): Payer: No Typology Code available for payment source | Admitting: Orthopaedic Surgery

## 2021-12-21 ENCOUNTER — Other Ambulatory Visit (HOSPITAL_BASED_OUTPATIENT_CLINIC_OR_DEPARTMENT_OTHER): Payer: Self-pay | Admitting: Student in an Organized Health Care Education/Training Program

## 2021-12-21 DIAGNOSIS — I5032 Chronic diastolic (congestive) heart failure: Secondary | ICD-10-CM

## 2021-12-21 DIAGNOSIS — M5430 Sciatica, unspecified side: Secondary | ICD-10-CM

## 2021-12-21 DIAGNOSIS — E662 Morbid (severe) obesity with alveolar hypoventilation: Secondary | ICD-10-CM

## 2021-12-21 DIAGNOSIS — R0600 Dyspnea, unspecified: Secondary | ICD-10-CM

## 2021-12-21 DIAGNOSIS — R609 Edema, unspecified: Secondary | ICD-10-CM

## 2021-12-21 LAB — BASIC METABOLIC PANEL
ANION GAP: 9 mmol/L — ABNORMAL LOW (ref 10–22)
BUN (UREA NITROGEN): 18 mg/dL (ref 7–18)
CALCIUM: 9.5 mg/dL (ref 8.5–10.5)
CARBON DIOXIDE: 35 mmol/L — ABNORMAL HIGH (ref 21–32)
CHLORIDE: 98 mmol/L (ref 98–107)
CREATININE: 0.8 mg/dL (ref 0.4–1.2)
ESTIMATED GLOMERULAR FILT RATE: >60 mL/min (ref >60)
Glucose Random: 110 mg/dL (ref 74–160)
POTASSIUM: 3.9 mmol/L (ref 3.5–5.1)
SODIUM: 142 mmol/L (ref 136–145)

## 2021-12-21 LAB — PHOSPHORUS MAGNESIUM
MAGNESIUM: 1.8 mg/dL (ref 1.6–2.6)
PHOSPHORUS: 5.1 mg/dL — ABNORMAL HIGH (ref 2.5–4.9)

## 2021-12-21 LAB — CBC WITH PLATELET
ABSOLUTE NRBC COUNT: 0 10*3/uL (ref 0.0–0.0)
HEMATOCRIT: 45.1 % — ABNORMAL HIGH (ref 34.1–44.9)
HEMOGLOBIN: 14.2 g/dL (ref 11.2–15.7)
MEAN CORP HGB CONC: 31.5 g/dL (ref 31.0–37.0)
MEAN CORPUSCULAR HGB: 30.3 pg (ref 26.0–34.0)
MEAN CORPUSCULAR VOL: 96.2 fl (ref 80.0–100.0)
MEAN PLATELET VOLUME: 11 fL (ref 8.7–12.5)
NRBC %: 0 % (ref 0.0–0.0)
PLATELET COUNT: 297 10*3/uL (ref 150–400)
RBC DISTRIBUTION WIDTH STD DEV: 52.3 fL — ABNORMAL HIGH (ref 35.1–46.3)
RED BLOOD CELL COUNT: 4.69 M/uL (ref 3.90–5.20)
WHITE BLOOD CELL COUNT: 5.8 10*3/uL (ref 4.0–11.0)

## 2021-12-21 LAB — NT-PROBNP: NT-proBNP: 63 pg/mL (ref 0–125)

## 2021-12-21 MED ORDER — ENOXAPARIN SODIUM 80 MG/0.8ML IJ SOSY
0.5000 mg/kg | PREFILLED_SYRINGE | INTRAMUSCULAR | Status: DC
Start: 2021-12-21 — End: 2021-12-21

## 2021-12-21 MED ORDER — ENOXAPARIN SODIUM 80 MG/0.8ML IJ SOSY
0.5000 mg/kg | PREFILLED_SYRINGE | Freq: Two times a day (BID) | INTRAMUSCULAR | Status: DC
Start: 2021-12-22 — End: 2021-12-22
  Administered 2021-12-22: 80 mg via SUBCUTANEOUS
  Filled 2021-12-21: qty 1

## 2021-12-21 NOTE — Progress Notes (Signed)
Pt admitted with SI as a psych border awaiting bed availability. She has [employer] coverage and [name] CM [phone] along with pt provided the following information.  Pt was admitted to psych at [facility] and was sent to [facility] floor after injuring her knee in fall. She left after she was medically cleared 3/31 and did not want to wait for assistance with finding a respite bed through [facility].   Pt was at a group home and did not feel safe there per notes. She tells me she went to stay at a friends home in [place] , but left due to others using drugs there. She reports herself as " homeless."  Pt I morbidly obese and has a rollator. She reports falling frequently with it. She also has difficulty washing/ dressing as well.  Pt has psych hx and+ SI . She has never been to short term rehab and may have had VNA 2 years ago Pt is also on Suboxone + substance abuse hx.   [name] from [employer] states there was a meeting scheduled recently to address housing and services issue of Cheryl Zimmerman , but she left 3/31 before these services/ issues were coordinated with her providers.  She states they will follow pt psych to assist with safe dispo.  Social work consult requested from [place] this AM to assist with case.  Plan: psych when bed available. SV

## 2021-12-21 NOTE — Progress Notes (Signed)
Resident Daily Progress Note    74F w/ PMHx HFpEF, COPD/asthma, OSA, OHS, hypothyroidism, seizure disorder, Polysubstance use (cocaine, fentanyl, EtOH, heavy tobacco smoke), complex chronic pain syndrome, lumbar spinal stenosis w/ sciatica, chronic L lateral/medial meniscal tears, BMI 51.5, schizoaffective disorder, bipolar disorder, PTSD, multiple suicide attempts, unstable housing, and recent hospitalization ~3/7 - 3/31 for SI, presented to ED on 4/1 for SI/HI, admitted on 4/2 to medical floor to board awaiting inpatient psych bed.     Interval History:   - no acute events overnight  - no output recorded in chart    SUBJECTIVE:   This AM, she reports she had a terrible night because she woke up after having nightmares and feeling like she was gasping for air. This morning, she is breathing comfortably on 2L NC. Still having swelling in her legs. Reports torsemide doesn't work as well for her as the IV lasix. States the burning pain she was having yesterday is gone.     ROS otherwise negative except as noted above.    Scheduled Meds:    heparin (porcine)  5,000 Units Subcutaneous Q12H [facility]    torsemide  120 mg Oral BID    lidocaine  1 patch Topical Q24H    prazosin  3 mg Oral Nightly    OXcarbazepine  150 mg Oral BID    nicotine  1 patch Transdermal Daily    loxapine  25 mg Oral BID    levothyroxine  25 mcg Oral DAILY    levETIRAcetam  750 mg Oral BID    FLUoxetine  20 mg Oral Daily    atorvastatin  80 mg Oral Daily    QUEtiapine  50 mg Oral Nightly    aspirin  81 mg Oral Daily    sennosides  8.6 mg Oral Daily    melatonin  3 mg Oral Nightly    budesonide-formoterol  2 puff Inhalation 2 times daily    thiamine  100 mg Oral Daily    loratadine  10 mg Oral Daily    pantoprazole  40 mg Oral Daily    methocarbamol  750 mg Oral TID    naproxen  500 mg Oral BID WC    buprenorphine-naloxone  8 mg Sublingual TID    gabapentin  600 mg Oral TID     Continuous Infusions:   PRN Meds: acetaminophen,  nicotine polacrilex, diclofenac, ondansetron, docusate sodium, albuterol HFA, LORazepam  Fluids:     OBJECTIVE:   Vital signs in last 24 hours:  Temp:  [97.5 F (36.4 C)-98.3 F (36.8 C)] 97.8 F (36.6 C)  Pulse:  [81-88] 82  Resp:  [16-20] 19  BP: (92-127)/(51-84) 112/74  SpO2: (!) 89 % (12/21/2021  7:28 AM)       Intake/Output Summary (Last 24 hours) at 12/21/2021 1024  Last data filed at 12/21/2021 0900  Gross per 24 hour   Intake 600 ml   Output --   Net 600 ml       Exam:  BP 112/74   Pulse 82   Temp 97.8 F (36.6 C)   Resp 19   Ht 5\' 9"  (1.753 m)   Wt (!) 158.2 kg (348 lb 12.8 oz)   SpO2 (!) 89%   BMI 51.51 kg/m   General: Well-nourished female sitting up in bed, appears stated age, in no acute distress, answers questions appropriately, cooperative with exam.  HEENT: NCAT. EOMI. Clear conjunctiva, no scleral icterus.   Neck: Supple, trachea midline, Normal ROM.  Lungs: Non-labored  respirations. Decreased breath sounds but otherwise CTAB. B/L equal chest expansion.  Heart: RRR, no M/R/G.  Abdomen: soft, NTND, no guarding or rebound, no organomegaly or masses.  Back: No obvious deformity, no vertebral or sacral tenderness. No CVA tenderness.  Extremities: Warm, moving four extremities independently, 2+ pitting edema of LEs up to mid shins.  Skin: Warm, dry, no rashes or lesions noted  Psych: Intermittently tearful  Neuro: AAO x 4.  Gait not tested.    DATA:  Recent Labs     12/18/21  1840 12/21/21  0725   NA 142 142   K 3.9 3.9   CL 101 98   CO2 30 35*   BUN 13 18   CREAT 0.7 0.8   GFR > 60 > 60   ANION 11 9*   GLUCOSER 114 110   WBC 10.0 5.8   HGB 15.5 14.2   HCT 47.4* 45.1*   PLTA 301 297   CA 9.4 9.5   MG  --  1.8   PHOS  --  5.1*     TROPONIN T HS RANDOM (ng/L)   Date Value   11/30/2021 17 (H)     DELTA 1 HOUR TROPONIN T HS (ng/L)   Date Value   11/24/2021 0     DELTA 3 HOUR TROPONIN T HS (ng/L)   Date Value   11/24/2021 1         NT-proBNP (pg/mL)   Date Value   12/21/2021 63   12/18/2021 665 (H)    12/06/2021 52     No results found for: BNP      Micro:  COVID-19 4/1: negative    Imaging:   MRI Knee Left WO Contrast  MR Knee, Left    CLINICAL INDICATION: Status post trauma, pain    COMPARISON: X-ray 12/05/2021     TECHNIQUE: Ax PDfs, Sag T58fs, Sag PD, Sag DESS, Cor T52fs, Cor T1. The study is slightly degraded due to patient body habitus.    FINDINGS:    Menisci:  Lateral meniscus: There is an tear in the posterior horn of the lateral meniscus extending to the inferior articular surface. Mild degeneration of the anterior horn of the lateral meniscus.  Medial meniscus: There is a small radial tear in the body of the medial meniscus.    Ligaments:  Cruciate ligaments: Anterior and posterior cruciate ligaments are intact.  Medial collateral ligament: Superficial and deep components intact. No periligamentous edema.  Lateral collateral ligament complex: Intact. There is small amount of fluid adjacent to the lateral collateral ligament which may represent mild sprain.Marland Kitchen  Posterolateral corner structures: Intact.    Extensor mechanism: The distal quadriceps and patellar tendons are intact.  There is no retinacular disruption.    Fluid: There is small joint effusion. There is a small popliteal cyst measuring 4.4 x 1.4 x 1.8 cm.    Osseous and articular structures:  Bones: There is mild marrow edema in the distal femur with a small subchondral cyst. There is a small osteochondral lesion in the. There is no marrow edema to suggest fracture. There are subchondral cystic changes at the lateral and medial facet of the patella which may be related to degenerative changes.  Patellofemoral compartment: There is patellofemoral joint space narrowing with fraying of the patellar cartilage along the lateral facet of the patella with question of a small cartilage defect. There is fraying and abnormal signal in the trochlear cartilage.  Medial compartment: There is medial joint compartment narrowing  with thinning of the  cartilage.  Lateral compartment: There is mild lateral joint compartment narrowing with thinning of the cartilage.    Other: There is diffuse soft tissue edema of the knee.Marland Kitchen    IMPRESSION:     Complex tear in the posterior horn of the lateral meniscus and radial tear in the body of the medial meniscus    Small right knee joint effusion and a small popliteal cyst. Diffuse edema of the subcutaneous tissues around the knee joint.    Mild degenerative changes of the knee with tricompartment narrowing and fraying of the the patellar cartilage.    No acute fractures of the left knee.          Reviewed and Electronically Signed by: Harolyn Rutherford MD   Signed Date/Time: 12-08-2021 20:33:51           ASSESSMENT / PLAN:   32F w/ PMHx HFpEF, COPD/asthma, OSA, OHS, hypothyroidism, seizure disorder, Polysubstance use (cocaine, fentanyl, EtOH, heavy tobacco smoke), complex chronic pain syndrome, lumbar spinal stenosis w/ sciatica, chronic L lateral/medial meniscal tears, BMI 51.5, schizoaffective disorder, bipolar disorder, PTSD, multiple suicide attempts, unstable housing, and recent hospitalization ~3/7 - 3/31 for SI, presented to ED on 4/1 for SI/HI and worsening dyspnea/ peripheral edema, admitted on 4/2 to medical floor to board awaiting inpatient psych bed.     #HFpEF (EF 70%, no WMA on TTE 12/07/21)  #Dyspnea  #Peripheral edema  #OHS, OSA  #Likely COPD (no PFTs)  Patient describes dyspnea and peripheral edema worse since recent hospital discharge. She was discharged on torsemide 100mg  BID, missing about ~ 1d of medication. Given her history of obesity, likely OHS/OSA and suspected COPD (90 year pack year history), has right-sided heart failure. Possible that she has a CHF exacerbation, but NT-proBNP has improved form 665 on 4/1 to 63 on 4/4.   No evidence of infection or COPD exacerbation. Admitted on ~2 L supplemental O2 w/ SpO2 >92 (baseline is closer to ~90 on chart review), now on room air.  - obtain CXR to evaluate for  CHF exacerbation  - strict I/Os and daily standing weights  - continue torsemide to 120mg  BID but readjust as needed after results of CXR  - SpO2 goal 88-92, if SpO2 >90, does not need supplemental O2  - c/w Symbicort (ICS/LABA), albuterol PRN  - outpatient PFTs and sleep study    #SI/HI  #H/o suicide attempt  Per pt, was [place] on train tracks [place].  -suicide precautions including close 1:1 obs  -psych consult, appreciate recs   -awaiting inpatient psych bed    #Burning sensation in lungs/right back, resolved  Suspect due to recent cocaine use (onset after hospital d/c, cocaine use). No concerning skin changes on back, no concerning urinary sxs. Improved after receiving GI cocktail.     #Complex chronic pain syndrome  #Lumbar spinal stenosis w/ sciatica  #L knee pain, improved  #Chronic L medial/lateral meniscal tears on MRI  Had mechanical fall while on recent psych admission resulting in increased L knee pain and difficulty ambulating, requiring transfer to medicine. Evaluated by ortho who felt MRI L knee findings appeared chronic.  - naproxen 500mg  BID  - suboxone 8-2mg  daily  - gabapentin 600mg  TID  - robaxin 750mg  TID  - tylenol 650mg  q6hrs PRN  - diclofenac TID PRN    #PSUD  UDS positive for marijuana, cocaine, suboxone.  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

## 2021-12-21 NOTE — Plan of Care (Addendum)
Pt alert and oriented. Pt resting in bed. Pt having no complaints. Constant observer at bedside. SI/HI in pt admission.    Pt awakening multiple times overnight, yelling at pt observer, demanding she doesn't like these kids and why do they have to be watching her. Therapeutic communication provided. Medication given per [facility].       Pt refusing AM weight.    All safety precautions maintained.                  Problem: Safety  Goal: Free from accidental physical injury  Outcome: Progressing     Problem: Pain  Goal: Patient's pain/discomfort is manageable  Description: Assess and monitor patient's pain using appropriate pain scale. Collaborate with interdisciplinary team and initiate plan and interventions as ordered. Re-assess patient's pain level 30 - 60 minutes after pain management intervention.   Outcome: Progressing     Problem: Ineffective Coping  Goal: LTG - verbalizes alternatives to suicide  Outcome: Progressing  Goal: Verbalizes ways to manage anxiety  Outcome: Progressing

## 2021-12-21 NOTE — Telephone Encounter (Signed)
Return call to pt  Pt reports she is currently at [facility], very concerned because she is retaining fluid and gained over 25 lbs overnight  Waiting for order for IV Lasix, wants to know if PCP can help with this  Advised while pt is in the hospital, her care is deferred to the inpatient team   Pt wants to know what her PCP can do for her, advised he will f/u when she is discharged from the hospital  Pt wants him to call her directly to check in  Routing message to PCP    Luisa Hart, RN
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
## 2021-12-21 NOTE — Event Note (Signed)
This RN spoke with Hilda Lias from [place] [phone]). Hilda Lias states that pt's annual insurance assessment is due tomorrow (4/5). SW and CM paged.

## 2021-12-21 NOTE — Telephone Encounter (Signed)
-----   Message from Varney Biles sent at 12/21/2021  2:57 PM EDT -----  Regarding: to speak to PCP, in [facility], really scared, wants to be transferred to Cheryl Zimmerman [id number], 51 year old, female    Calls today:  Clinical Questions ([place])    Name of person calling - Yazira  Specific nature of request - to speak to PCP, in [facility], really scared, wants to be transferred to [place]  Return phone number [phone]  Person calling on behalf of patient: Patient (self)    [place] NUMBER: [phone]  Best time to call back:   Cell phone:   Other phone:    Patient's language of care: English    Patient does not need an interpreter.    Patient's PCP: Cora Collum, MD

## 2021-12-21 NOTE — Progress Notes (Signed)
Pt called SW on Saturday 12/18/2021 afternoon. Pt left message asking that SW call her back as soon as possible because she needed help. SW was not working on Saturday.     Per chart, pt is admitted at [facility]. SW will not call pt back as she is no longer admitted inpatient at [facility] and is being seen elsewhere.
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
## 2021-12-21 NOTE — Telephone Encounter (Signed)
Cheryl Collum, MD  Britt Bolognese  Could we then just have her scheduled with me as soon as possible? Thank you!     -Loa Socks       ?   Previous Messages  ?  ----- Message -----   From: Britt Bolognese   Sent: 12/20/2021 ? 3:54 PM EDT   To: Cheryl Collum, MD   Subject: RE: Follow up post hospitalization ? ? ? ? ?     Hi Dr Loa Socks,     You are completely booked for that week. Please advice.     Britt Bolognese, 12/20/2021     ----- Message -----   From: Cheryl Collum, MD   Sent: 12/17/2021 ? 8:09 AM EDT   To: Meredith Pel Desk Pool   Subject: Follow up post hospitalization ? ? ? ? ? ? ?     Hi,     This patient would like to see me as PCP at [facility] post-hospitalization follow up. I was wondering if she could be scheduled to see me possibly the week of April 10 in place of a new visit. Please let me know if this could be possible, thank you.     Sincerely,   Cheryl Collum, MD   PGY1      Planned Care Outreach  Call made to Katheren Puller to?schedule an appointment?f/u . No interpreter needed.   Patient (self) answered at [phone] ; appointment booked.  I have completed the following communication and reminder steps: Patient notified by telephone  Britt Bolognese, 12/21/2021    On behalf of PCP:?Cheryl Collum, MD?

## 2021-12-21 NOTE — Plan of Care (Signed)
Problem: Safety  Goal: Free from accidental physical injury  Outcome: Progressing   Pt a/o, denies pain. Ambulates with rolling walker, dyspneic on exertion. Bilat lowers +2. Remains 1:1 for si. Denies si/hi/avh. Continue to monitor.

## 2021-12-21 NOTE — Telephone Encounter (Signed)
-----   Message from Cora Collum, MD sent at 12/20/2021  5:24 PM EDT -----  Regarding: RE: Follow up post hospitalization  Could we then just have her scheduled with me as soon as possible? Thank you!    -Loa Socks  ----- Message -----  From: Britt Bolognese  Sent: 12/20/2021   3:54 PM EDT  To: Cora Collum, MD  Subject: RE: Follow up post hospitalization               Hi Dr Loa Socks,     You are completely booked for that week. Please advice.     Britt Bolognese, 12/20/2021    ----- Message -----  From: Cora Collum, MD  Sent: 12/17/2021   8:09 AM EDT  To: Meredith Pel Desk Pool  Subject: Follow up post hospitalization                   Hi,    This patient would like to see me as PCP at [facility] post-hospitalization follow up. I was wondering if she could be scheduled to see me possibly the week of April 10 in place of a new visit. Please let me know if this could be possible, thank you.    Sincerely,  Cora Collum, MD  PGY1

## 2021-12-21 NOTE — Plan of Care (Signed)
VSS, Afebrile. Pt is on 2L o2 for OAS during sleep, Pt on RA, 92% o2 sat, elevated CO2 noted. NC removed, will continue room sweep for safety. Dyspnea on exersion. Voids in commode, rolling walker for ambulation, unsteady gait and balance. Pt reports 7/10 pain at  R flank, see MAR for details. Pt agitated, "feels very anxious" and claims to be seeing deceased son, ativan given at 16 and 1645. Needed items are close by. Constant observation for safety.

## 2021-12-21 NOTE — Event Note (Signed)
COPD screening completed, pt is a CAT 31, STOP Bang 5

## 2021-12-22 ENCOUNTER — Telehealth (HOSPITAL_BASED_OUTPATIENT_CLINIC_OR_DEPARTMENT_OTHER): Payer: Self-pay

## 2021-12-22 ENCOUNTER — Encounter (HOSPITAL_BASED_OUTPATIENT_CLINIC_OR_DEPARTMENT_OTHER): Payer: Self-pay | Admitting: Psychiatry

## 2021-12-22 ENCOUNTER — Inpatient Hospital Stay
Admission: AD | Admit: 2021-12-22 | Discharge: 2022-01-06 | DRG: 885 | Disposition: A | Discharge status: Home / Self Care | Payer: No Typology Code available for payment source | Attending: Psychiatry | Admitting: Psychiatry

## 2021-12-22 DIAGNOSIS — T1491XA Suicide attempt, initial encounter: Secondary | ICD-10-CM | POA: Diagnosis present

## 2021-12-22 DIAGNOSIS — R2689 Other abnormalities of gait and mobility: Secondary | ICD-10-CM | POA: Diagnosis present

## 2021-12-22 DIAGNOSIS — J449 Chronic obstructive pulmonary disease, unspecified: Secondary | ICD-10-CM | POA: Diagnosis present

## 2021-12-22 DIAGNOSIS — R109 Unspecified abdominal pain: Secondary | ICD-10-CM | POA: Diagnosis not present

## 2021-12-22 DIAGNOSIS — R45851 Suicidal ideations: Secondary | ICD-10-CM | POA: Diagnosis present

## 2021-12-22 DIAGNOSIS — E039 Hypothyroidism, unspecified: Secondary | ICD-10-CM | POA: Diagnosis present

## 2021-12-22 DIAGNOSIS — I2510 Atherosclerotic heart disease of native coronary artery without angina pectoris: Secondary | ICD-10-CM | POA: Diagnosis present

## 2021-12-22 DIAGNOSIS — Z5900 Homelessness unspecified: Secondary | ICD-10-CM

## 2021-12-22 DIAGNOSIS — F333 Major depressive disorder, recurrent, severe with psychotic symptoms: Secondary | ICD-10-CM

## 2021-12-22 DIAGNOSIS — R911 Solitary pulmonary nodule: Secondary | ICD-10-CM | POA: Diagnosis not present

## 2021-12-22 DIAGNOSIS — R0609 Other forms of dyspnea: Secondary | ICD-10-CM | POA: Diagnosis present

## 2021-12-22 DIAGNOSIS — I5022 Chronic systolic (congestive) heart failure: Secondary | ICD-10-CM | POA: Diagnosis present

## 2021-12-22 DIAGNOSIS — F603 Borderline personality disorder: Secondary | ICD-10-CM | POA: Diagnosis present

## 2021-12-22 DIAGNOSIS — Y92009 Unspecified place in unspecified non-institutional (private) residence as the place of occurrence of the external cause: Secondary | ICD-10-CM | POA: Diagnosis present

## 2021-12-22 DIAGNOSIS — F4310 Post-traumatic stress disorder, unspecified: Secondary | ICD-10-CM | POA: Diagnosis present

## 2021-12-22 DIAGNOSIS — E785 Hyperlipidemia, unspecified: Secondary | ICD-10-CM | POA: Diagnosis present

## 2021-12-22 DIAGNOSIS — W19XXXA Unspecified fall, initial encounter: Secondary | ICD-10-CM | POA: Diagnosis present

## 2021-12-22 DIAGNOSIS — F17210 Nicotine dependence, cigarettes, uncomplicated: Secondary | ICD-10-CM | POA: Diagnosis present

## 2021-12-22 DIAGNOSIS — I5032 Chronic diastolic (congestive) heart failure: Secondary | ICD-10-CM | POA: Diagnosis not present

## 2021-12-22 DIAGNOSIS — E119 Type 2 diabetes mellitus without complications: Secondary | ICD-10-CM | POA: Diagnosis present

## 2021-12-22 DIAGNOSIS — F332 Major depressive disorder, recurrent severe without psychotic features: Secondary | ICD-10-CM | POA: Diagnosis present

## 2021-12-22 DIAGNOSIS — F339 Major depressive disorder, recurrent, unspecified: Secondary | ICD-10-CM | POA: Diagnosis present

## 2021-12-22 DIAGNOSIS — J441 Chronic obstructive pulmonary disease with (acute) exacerbation: Secondary | ICD-10-CM | POA: Diagnosis not present

## 2021-12-22 DIAGNOSIS — Z9981 Dependence on supplemental oxygen: Secondary | ICD-10-CM

## 2021-12-22 DIAGNOSIS — F1120 Opioid dependence, uncomplicated: Secondary | ICD-10-CM | POA: Diagnosis present

## 2021-12-22 DIAGNOSIS — F251 Schizoaffective disorder, depressive type: Secondary | ICD-10-CM | POA: Diagnosis present

## 2021-12-22 DIAGNOSIS — F1090 Alcohol use, unspecified, uncomplicated: Secondary | ICD-10-CM | POA: Diagnosis present

## 2021-12-22 DIAGNOSIS — F32A Depression, unspecified: Secondary | ICD-10-CM | POA: Diagnosis present

## 2021-12-22 DIAGNOSIS — R600 Localized edema: Secondary | ICD-10-CM | POA: Diagnosis not present

## 2021-12-22 DIAGNOSIS — M48061 Spinal stenosis, lumbar 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 claudication: Secondary | ICD-10-CM | POA: Diagnosis present

## 2021-12-22 DIAGNOSIS — I5020 Unspecified systolic (congestive) heart failure: Secondary | ICD-10-CM | POA: Diagnosis present

## 2021-12-22 DIAGNOSIS — G40909 Epilepsy, unspecified, not intractable, without status epilepticus: Secondary | ICD-10-CM | POA: Diagnosis present

## 2021-12-22 DIAGNOSIS — Z720 Tobacco use: Secondary | ICD-10-CM | POA: Diagnosis present

## 2021-12-22 DIAGNOSIS — Z91148 Patient's other noncompliance with medication regimen for other reason: Secondary | ICD-10-CM

## 2021-12-22 DIAGNOSIS — R2681 Unsteadiness on feet: Secondary | ICD-10-CM | POA: Diagnosis present

## 2021-12-22 LAB — COVID-19 ANTIGEN: COVID-19 ANTIGEN: NEGATIVE

## 2021-12-22 MED ORDER — LORATADINE 10 MG PO TABS
10.0000 mg | ORAL_TABLET | Freq: Every day | ORAL | Status: DC
Start: 2021-12-23 — End: 2022-01-06
  Administered 2021-12-23 – 2022-01-06 (×15): 10 mg via ORAL
  Filled 2021-12-22 (×15): qty 1

## 2021-12-22 MED ORDER — ALUMINUM-MAGNESIUM-SIMETHICONE 200-200-20 MG/5ML PO SUSP
30.0000 mL | ORAL | Status: DC | PRN
Start: 2021-12-22 — End: 2022-01-06
  Administered 2021-12-25 (×2): 30 mL via ORAL
  Filled 2021-12-22 (×2): qty 30

## 2021-12-22 MED ORDER — QUETIAPINE FUMARATE 50 MG PO TABS
50.0000 mg | ORAL_TABLET | Freq: Every evening | ORAL | Status: DC
Start: 2021-12-22 — End: 2021-12-22

## 2021-12-22 MED ORDER — OXCARBAZEPINE 150 MG PO TABS
150.0000 mg | ORAL_TABLET | Freq: Two times a day (BID) | ORAL | 0 refills | Status: DC
Start: 2021-12-22 — End: 2022-01-06

## 2021-12-22 MED ORDER — LOXAPINE SUCCINATE 10 MG PO CAPS
10.0000 mg | ORAL_CAPSULE | Freq: Two times a day (BID) | ORAL | 0 refills | Status: DC
Start: 2021-12-22 — End: 2022-01-06

## 2021-12-22 MED ORDER — QUETIAPINE FUMARATE 100 MG PO TABS
100.0000 mg | ORAL_TABLET | Freq: Every evening | ORAL | Status: DC
Start: 2021-12-22 — End: 2021-12-24
  Administered 2021-12-22 – 2021-12-23 (×2): 100 mg via ORAL
  Filled 2021-12-22 (×2): qty 1

## 2021-12-22 MED ORDER — FLUOXETINE HCL 20 MG PO CAPS
20.0000 mg | ORAL_CAPSULE | Freq: Every day | ORAL | 0 refills | Status: DC
Start: 2021-12-22 — End: 2022-01-06

## 2021-12-22 MED ORDER — LIDOCAINE 4 % EX PTCH
1.0000 | MEDICATED_PATCH | CUTANEOUS | Status: DC
Start: 2021-12-23 — End: 2022-01-06
  Administered 2021-12-23 – 2022-01-06 (×15): 1 via TOPICAL
  Filled 2021-12-22 (×15): qty 1

## 2021-12-22 MED ORDER — DICLOFENAC SODIUM 1 % EX GEL
2.0000 g | Freq: Three times a day (TID) | CUTANEOUS | Status: DC | PRN
Start: 2021-12-22 — End: 2022-01-06
  Administered 2021-12-31 – 2022-01-04 (×4): 2 g via TOPICAL
  Filled 2021-12-22: qty 50

## 2021-12-22 MED ORDER — DOCUSATE SODIUM 100 MG PO CAPS
100.0000 mg | ORAL_CAPSULE | Freq: Every day | ORAL | Status: DC | PRN
Start: 2021-12-22 — End: 2022-01-06

## 2021-12-22 MED ORDER — LORAZEPAM 1 MG PO TABS
1.0000 mg | ORAL_TABLET | Freq: Two times a day (BID) | ORAL | Status: DC | PRN
Start: 2021-12-22 — End: 2021-12-24
  Administered 2021-12-23 (×2): 1 mg via ORAL
  Filled 2021-12-22 (×2): qty 1

## 2021-12-22 MED ORDER — PRAZOSIN HCL 1 MG PO CAPS
3.0000 mg | ORAL_CAPSULE | Freq: Every evening | ORAL | Status: DC
Start: 2021-12-22 — End: 2022-01-06
  Administered 2021-12-22 – 2022-01-05 (×15): 3 mg via ORAL
  Filled 2021-12-22 (×15): qty 3

## 2021-12-22 MED ORDER — NICOTINE 21 MG/24HR TD PT24
1.0000 | MEDICATED_PATCH | Freq: Every day | TRANSDERMAL | Status: DC
Start: 2021-12-23 — End: 2022-01-06
  Administered 2021-12-23 – 2022-01-06 (×15): 1 via TRANSDERMAL
  Filled 2021-12-22 (×15): qty 1

## 2021-12-22 MED ORDER — METHOCARBAMOL 500 MG PO TABS
750.0000 mg | ORAL_TABLET | Freq: Three times a day (TID) | ORAL | Status: DC
Start: 2021-12-22 — End: 2022-01-06
  Administered 2021-12-22 – 2022-01-06 (×45): 750 mg via ORAL
  Filled 2021-12-22 (×45): qty 2

## 2021-12-22 MED ORDER — ASPIRIN 81 MG PO CHEW
81.0000 mg | CHEWABLE_TABLET | Freq: Every day | ORAL | Status: DC
Start: 2021-12-22 — End: 2021-12-22

## 2021-12-22 MED ORDER — ONDANSETRON 4 MG PO TBDP
4.0000 mg | ORAL_TABLET | Freq: Three times a day (TID) | ORAL | Status: DC | PRN
Start: 2021-12-22 — End: 2022-01-06
  Administered 2021-12-28 – 2022-01-04 (×3): 4 mg via ORAL
  Filled 2021-12-22 (×3): qty 1

## 2021-12-22 MED ORDER — ATORVASTATIN CALCIUM 40 MG PO TABS
80.0000 mg | ORAL_TABLET | Freq: Every day | ORAL | Status: DC
Start: 2021-12-23 — End: 2022-01-06
  Administered 2021-12-23 – 2022-01-06 (×15): 80 mg via ORAL
  Filled 2021-12-22 (×16): qty 2

## 2021-12-22 MED ORDER — LOXAPINE SUCCINATE 25 MG PO CAPS
25.0000 mg | ORAL_CAPSULE | Freq: Two times a day (BID) | ORAL | Status: DC
Start: 2021-12-22 — End: 2022-01-06
  Administered 2021-12-22 – 2022-01-06 (×30): 25 mg via ORAL
  Filled 2021-12-22 (×31): qty 1

## 2021-12-22 MED ORDER — NICOTINE POLACRILEX 4 MG MT GUM
4.0000 mg | CHEWING_GUM | OROMUCOSAL | Status: DC | PRN
Start: 2021-12-22 — End: 2022-01-06

## 2021-12-22 MED ORDER — ALBUTEROL SULFATE HFA 108 (90 BASE) MCG/ACT IN AERS
2.0000 | INHALATION_SPRAY | Freq: Four times a day (QID) | RESPIRATORY_TRACT | Status: DC | PRN
Start: 2021-12-22 — End: 2022-01-06
  Administered 2021-12-31 – 2022-01-01 (×2): 2 via RESPIRATORY_TRACT
  Filled 2021-12-22: qty 6.7

## 2021-12-22 MED ORDER — LIDOCAINE 4 % EX PTCH
1.0000 | MEDICATED_PATCH | CUTANEOUS | 0 refills | Status: DC
Start: 2021-12-22 — End: 2022-01-06

## 2021-12-22 MED ORDER — ONDANSETRON 4 MG PO TBDP
4.0000 mg | ORAL_TABLET | Freq: Three times a day (TID) | ORAL | Status: DC | PRN
Start: 2021-12-22 — End: 2022-01-06

## 2021-12-22 MED ORDER — DICLOFENAC SODIUM 1 % EX GEL
4.0000 g | Freq: Four times a day (QID) | CUTANEOUS | 0 refills | Status: DC
Start: 2021-12-22 — End: 2022-01-06

## 2021-12-22 MED ORDER — PANTOPRAZOLE SODIUM 40 MG PO TBEC
40.0000 mg | DELAYED_RELEASE_TABLET | Freq: Every day | ORAL | Status: DC
Start: 2021-12-23 — End: 2022-01-06
  Administered 2021-12-23 – 2022-01-06 (×15): 40 mg via ORAL
  Filled 2021-12-22 (×15): qty 1

## 2021-12-22 MED ORDER — TORSEMIDE 60 MG PO TABS
120.0000 mg | ORAL_TABLET | Freq: Two times a day (BID) | ORAL | Status: DC
Start: 2021-12-22 — End: 2022-01-06

## 2021-12-22 MED ORDER — PANTOPRAZOLE SODIUM 40 MG PO TBEC
40.0000 mg | DELAYED_RELEASE_TABLET | Freq: Every day | ORAL | Status: DC
Start: 2021-12-23 — End: 2022-01-06

## 2021-12-22 MED ORDER — DOCUSATE SODIUM 100 MG PO CAPS
100.0000 mg | ORAL_CAPSULE | Freq: Two times a day (BID) | ORAL | Status: DC | PRN
Start: 2021-12-22 — End: 2022-01-06
  Administered 2021-12-25 – 2022-01-06 (×5): 100 mg via ORAL
  Filled 2021-12-22 (×6): qty 1

## 2021-12-22 MED ORDER — METHOCARBAMOL 750 MG PO TABS
750.0000 mg | ORAL_TABLET | Freq: Three times a day (TID) | ORAL | Status: DC
Start: 2021-12-22 — End: 2022-01-06

## 2021-12-22 MED ORDER — FLUOXETINE HCL 20 MG PO CAPS
20.0000 mg | ORAL_CAPSULE | Freq: Every day | ORAL | Status: DC
Start: 2021-12-23 — End: 2022-01-06
  Administered 2021-12-23 – 2022-01-06 (×15): 20 mg via ORAL
  Filled 2021-12-22 (×15): qty 1

## 2021-12-22 MED ORDER — LORATADINE 10 MG PO CAPS
10.0000 mg | ORAL_CAPSULE | Freq: Every day | ORAL | Status: DC
Start: 2021-12-22 — End: 2022-01-06

## 2021-12-22 MED ORDER — ENOXAPARIN SODIUM 80 MG/0.8ML IJ SOSY
0.5000 mg/kg | PREFILLED_SYRINGE | INTRAMUSCULAR | Status: DC
Start: 2021-12-22 — End: 2021-12-22

## 2021-12-22 MED ORDER — QUETIAPINE FUMARATE 50 MG PO TABS
50.0000 mg | ORAL_TABLET | Freq: Every evening | ORAL | 0 refills | Status: DC
Start: 2021-12-22 — End: 2022-01-06

## 2021-12-22 MED ORDER — BUPRENORPHINE HCL-NALOXONE HCL 8-2 MG SL SUBL
1.0000 | SUBLINGUAL_TABLET | Freq: Three times a day (TID) | SUBLINGUAL | 0 refills | Status: DC
Start: 2021-12-22 — End: 2022-01-06

## 2021-12-22 MED ORDER — MELATONIN 3 MG PO TABS
3.0000 mg | ORAL_TABLET | Freq: Every evening | ORAL | Status: DC
Start: 2021-12-22 — End: 2022-01-06

## 2021-12-22 MED ORDER — ASPIRIN 81 MG PO CHEW
81.0000 mg | CHEWABLE_TABLET | Freq: Every day | ORAL | Status: DC
Start: 2021-12-22 — End: 2022-01-06

## 2021-12-22 MED ORDER — ASPIRIN 81 MG PO CHEW
81.0000 mg | CHEWABLE_TABLET | Freq: Every day | ORAL | Status: DC
Start: 2021-12-23 — End: 2022-01-06
  Administered 2021-12-23 – 2022-01-06 (×15): 81 mg via ORAL
  Filled 2021-12-22 (×15): qty 1

## 2021-12-22 MED ORDER — LEVETIRACETAM 750 MG PO TABS
750.0000 mg | ORAL_TABLET | Freq: Two times a day (BID) | ORAL | 0 refills | Status: DC
Start: 2021-12-22 — End: 2022-01-06

## 2021-12-22 MED ORDER — GABAPENTIN 300 MG PO CAPS
600.0000 mg | ORAL_CAPSULE | Freq: Three times a day (TID) | ORAL | 0 refills | Status: DC
Start: 2021-12-22 — End: 2022-01-06

## 2021-12-22 MED ORDER — NICOTINE POLACRILEX 2 MG MT GUM
4.00 mg | CHEWING_GUM | OROMUCOSAL | Status: AC | PRN
Start: 2021-12-22 — End: 2021-12-28
  Administered 2021-12-23 – 2021-12-28 (×19): 4 mg via ORAL
  Filled 2021-12-22 (×21): qty 2

## 2021-12-22 MED ORDER — BUDESONIDE-FORMOTEROL FUMARATE 160-4.5 MCG/ACT IN AERO
2.0000 | INHALATION_SPRAY | Freq: Two times a day (BID) | RESPIRATORY_TRACT | Status: DC
Start: 2021-12-22 — End: 2022-01-06
  Administered 2021-12-22 – 2022-01-06 (×29): 2 via RESPIRATORY_TRACT
  Filled 2021-12-22: qty 6

## 2021-12-22 MED ORDER — BUDESONIDE-FORMOTEROL FUMARATE 160-4.5 MCG/ACT IN AERO
2.0000 | INHALATION_SPRAY | Freq: Two times a day (BID) | RESPIRATORY_TRACT | 0 refills | Status: DC
Start: 2021-12-22 — End: 2022-01-06

## 2021-12-22 MED ORDER — QUETIAPINE FUMARATE 100 MG PO TABS
100.0000 mg | ORAL_TABLET | Freq: Every evening | ORAL | Status: DC | PRN
Start: 2021-12-22 — End: 2021-12-24
  Administered 2021-12-24: 100 mg via ORAL
  Filled 2021-12-22: qty 1

## 2021-12-22 MED ORDER — SENNOSIDES 8.6 MG PO TABS
8.6000 mg | ORAL_TABLET | Freq: Every day | ORAL | Status: DC
Start: 2021-12-23 — End: 2022-01-06
  Administered 2021-12-23 – 2022-01-06 (×15): 8.6 mg via ORAL
  Filled 2021-12-22 (×15): qty 1

## 2021-12-22 MED ORDER — LEVOTHYROXINE SODIUM 25 MCG PO TABS
25.0000 ug | ORAL_TABLET | Freq: Every morning | ORAL | Status: DC
  Administered 2021-12-23 – 2022-01-06 (×15): 25 ug via ORAL
  Filled 2021-12-22 (×15): qty 1

## 2021-12-22 MED ORDER — BUPRENORPHINE HCL-NALOXONE HCL 8-2 MG SL SUBL
8.0000 mg | SUBLINGUAL_TABLET | Freq: Three times a day (TID) | SUBLINGUAL | Status: DC
Start: 2021-12-22 — End: 2022-01-06
  Administered 2021-12-22 – 2022-01-06 (×44): 1 via SUBLINGUAL
  Filled 2021-12-22 (×45): qty 1

## 2021-12-22 MED ORDER — SENNOSIDES 8.6 MG PO TABS
8.6000 mg | ORAL_TABLET | Freq: Every day | ORAL | Status: DC
Start: 2021-12-22 — End: 2022-01-06

## 2021-12-22 MED ORDER — LOXAPINE SUCCINATE 25 MG PO CAPS
25.0000 mg | ORAL_CAPSULE | Freq: Two times a day (BID) | ORAL | 0 refills | Status: DC
Start: 2021-12-22 — End: 2022-01-06

## 2021-12-22 MED ORDER — PRAZOSIN HCL 1 MG PO CAPS
3.0000 mg | ORAL_CAPSULE | Freq: Every evening | ORAL | 0 refills | Status: DC
Start: 2021-12-22 — End: 2022-01-06

## 2021-12-22 MED ORDER — LORAZEPAM 1 MG PO TABS
1.00 mg | ORAL_TABLET | Freq: Once | ORAL | Status: AC
Start: 2021-12-22 — End: 2021-12-22
  Administered 2021-12-22: 1 mg via ORAL
  Filled 2021-12-22: qty 1

## 2021-12-22 MED ORDER — GABAPENTIN 300 MG PO CAPS
600.0000 mg | ORAL_CAPSULE | Freq: Three times a day (TID) | ORAL | Status: DC
Start: 2021-12-22 — End: 2022-01-06
  Administered 2021-12-22 – 2022-01-06 (×44): 600 mg via ORAL
  Filled 2021-12-22 (×46): qty 2

## 2021-12-22 MED ORDER — NICOTINE 21 MG/24HR TD PT24
1.0000 | MEDICATED_PATCH | Freq: Every day | TRANSDERMAL | Status: DC
Start: 2021-12-23 — End: 2022-01-06

## 2021-12-22 MED ORDER — LORAZEPAM 1 MG PO TABS
1.0000 mg | ORAL_TABLET | Freq: Two times a day (BID) | ORAL | Status: DC | PRN
Start: 2021-12-22 — End: 2021-12-22

## 2021-12-22 MED ORDER — OXCARBAZEPINE 150 MG PO TABS
150.0000 mg | ORAL_TABLET | Freq: Two times a day (BID) | ORAL | Status: DC
  Administered 2021-12-22 – 2022-01-06 (×30): 150 mg via ORAL
  Filled 2021-12-22 (×31): qty 1

## 2021-12-22 MED ORDER — ACETAMINOPHEN 325 MG PO TABS
650.0000 mg | ORAL_TABLET | Freq: Four times a day (QID) | ORAL | Status: DC | PRN
Start: 2021-12-22 — End: 2022-01-06

## 2021-12-22 MED ORDER — ALBUTEROL SULFATE HFA 108 (90 BASE) MCG/ACT IN AERS
2.0000 | INHALATION_SPRAY | Freq: Four times a day (QID) | RESPIRATORY_TRACT | 0 refills | Status: DC | PRN
Start: 2021-12-22 — End: 2022-01-06

## 2021-12-22 MED ORDER — NAPROXEN 250 MG PO TABS
500.0000 mg | ORAL_TABLET | Freq: Two times a day (BID) | ORAL | Status: DC
Start: 2021-12-23 — End: 2022-01-06
  Administered 2021-12-23 – 2022-01-06 (×29): 500 mg via ORAL
  Filled 2021-12-22 (×29): qty 2

## 2021-12-22 MED ORDER — LORAZEPAM 1 MG PO TABS
1.0000 mg | ORAL_TABLET | Freq: Two times a day (BID) | ORAL | 0 refills | Status: DC | PRN
Start: 2021-12-22 — End: 2022-01-06

## 2021-12-22 MED ORDER — NAPROXEN 500 MG PO TABS
500.0000 mg | ORAL_TABLET | Freq: Two times a day (BID) | ORAL | Status: DC
Start: 2021-12-22 — End: 2022-01-06

## 2021-12-22 MED ORDER — MAGNESIUM HYDROXIDE 400 MG/5ML PO SUSP
30.0000 mL | Freq: Every day | ORAL | Status: DC | PRN
Start: 2021-12-22 — End: 2022-01-06
  Administered 2021-12-25 – 2022-01-02 (×4): 30 mL via ORAL
  Filled 2021-12-22 (×6): qty 30

## 2021-12-22 MED ORDER — TORSEMIDE 20 MG PO TABS
120.0000 mg | ORAL_TABLET | Freq: Two times a day (BID) | ORAL | Status: DC
Start: 2021-12-23 — End: 2021-12-23
  Administered 2021-12-23 (×2): 120 mg via ORAL
  Filled 2021-12-22 (×2): qty 1

## 2021-12-22 MED ORDER — LIDOCAINE 4 % EX PTCH
1.0000 | MEDICATED_PATCH | CUTANEOUS | Status: DC
Start: 2021-12-22 — End: 2021-12-22

## 2021-12-22 MED ORDER — LEVOTHYROXINE SODIUM 25 MCG PO TABS
25.0000 ug | ORAL_TABLET | Freq: Every morning | ORAL | 0 refills | Status: DC
Start: 2021-12-22 — End: 2022-01-06

## 2021-12-22 MED ORDER — THIAMINE 100 MG PO TABS
100.0000 mg | ORAL_TABLET | Freq: Every day | ORAL | Status: DC
Start: 2021-12-23 — End: 2022-01-06
  Administered 2021-12-23 – 2022-01-06 (×15): 100 mg via ORAL
  Filled 2021-12-22 (×15): qty 1

## 2021-12-22 MED ORDER — ACETAMINOPHEN 325 MG PO TABS
650.0000 mg | ORAL_TABLET | Freq: Four times a day (QID) | ORAL | Status: DC | PRN
Start: 2021-12-22 — End: 2022-01-06
  Administered 2021-12-24 – 2022-01-01 (×4): 650 mg via ORAL
  Filled 2021-12-22 (×4): qty 2

## 2021-12-22 MED ORDER — ATORVASTATIN CALCIUM 80 MG PO TABS
80.0000 mg | ORAL_TABLET | Freq: Every day | ORAL | 0 refills | Status: DC
Start: 2021-12-22 — End: 2022-01-06

## 2021-12-22 MED ORDER — LEVETIRACETAM 500 MG PO TABS
750.0000 mg | ORAL_TABLET | Freq: Two times a day (BID) | ORAL | Status: DC
  Administered 2021-12-22 – 2022-01-06 (×30): 750 mg via ORAL
  Filled 2021-12-22 (×30): qty 1

## 2021-12-22 MED ORDER — MELATONIN 3 MG PO TABS
3.0000 mg | ORAL_TABLET | Freq: Every evening | ORAL | Status: DC
Start: 2021-12-22 — End: 2022-01-06
  Administered 2021-12-22 – 2022-01-05 (×15): 3 mg via ORAL
  Filled 2021-12-22 (×15): qty 1

## 2021-12-22 MED ORDER — TRAZODONE HCL 50 MG PO TABS
50.0000 mg | ORAL_TABLET | Freq: Every evening | ORAL | Status: DC | PRN
Start: 2021-12-22 — End: 2021-12-22
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
## 2021-12-22 NOTE — Telephone Encounter (Addendum)
Called pt to check in as pt requested speaking to PCP. Pt currently admitted at [facility]. Informed pt that will be more than happy to see pt in clinic once safe for discharge.

## 2021-12-22 NOTE — Discharge Inst - Reason you came to the hospital (Addendum)
Reason you came to the hospital: You were suicidal    Discharge Diagnosis: Suicidal Ideation, CHF exacerbation    Description of Hospital Course: You were brought to the hospital for suicidal ideation and found to be having trouble breathing.  We increased your home torsemide to help get fluid off of your lungs.  We gave you oxygen at night as we think you have a component of sleep apnea which makes your breathing worse.  He did not require oxygen during the day.  You were transferred to the psychiatric unit for further treatment.    Important Results: Your chest x-ray showed that you did not have that much fluid on your lungs after we were giving you your diuretic.    Please take your medications as prescribed.     Please follow up with your primary care doctor within 1 week.     Please return to the hospital or call your primary care doctor if you have:  - Suicidal Ideation, Shortness of breath, chest pain, headache, nausea, vomiting, fevers, dizziness, lightheadedness.

## 2021-12-22 NOTE — Plan of Care (Addendum)
Pt refused VS and standing daily weight this am. 2L O2 given for OAS at night, removed b/c O2 sat > 90% and safety. Dyspneic on exersion. Pt exhibits unsteady gait and balance, rollator close by and stand/pivot to commode. Pt calls for assist. Adequate PO intake, denies n/v. Pt accepts PO torsemide. . Accepts meds whole with water. Pt reports seeing deceased son,endorsed SI/HI. Ativan PO x1 given with good effect. Safety measures increased, continue on 1:1 observation, room safety sweep completed    Awaiting transfer to [facility]. COVID antigen swab collected and sent to lab.
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
## 2021-12-22 NOTE — PES NOTES (Signed)
[facility] auth: [id number]    Spoke with Raynald Kemp  Approved for today, 4/5 for 6 days. Last covered day on 10th, [facility] will reach out to contact on the unit directly.

## 2021-12-22 NOTE — Event Note (Signed)
1650: per Serina Cowper MD, pt does not need 1:1 constant observation on psych floor.

## 2021-12-23 ENCOUNTER — Other Ambulatory Visit (HOSPITAL_BASED_OUTPATIENT_CLINIC_OR_DEPARTMENT_OTHER): Payer: Self-pay | Admitting: Clinical

## 2021-12-23 ENCOUNTER — Encounter (HOSPITAL_BASED_OUTPATIENT_CLINIC_OR_DEPARTMENT_OTHER): Payer: Self-pay | Admitting: Psychiatry

## 2021-12-23 DIAGNOSIS — W19XXXA Unspecified fall, initial encounter: Secondary | ICD-10-CM | POA: Diagnosis present

## 2021-12-23 DIAGNOSIS — E785 Hyperlipidemia, unspecified: Secondary | ICD-10-CM

## 2021-12-23 DIAGNOSIS — K219 Gastro-esophageal reflux disease without esophagitis: Secondary | ICD-10-CM

## 2021-12-23 DIAGNOSIS — I5032 Chronic diastolic (congestive) heart failure: Secondary | ICD-10-CM

## 2021-12-23 DIAGNOSIS — G40909 Epilepsy, unspecified, not intractable, without status epilepticus: Secondary | ICD-10-CM

## 2021-12-23 DIAGNOSIS — E039 Hypothyroidism, unspecified: Secondary | ICD-10-CM

## 2021-12-23 DIAGNOSIS — F17200 Nicotine dependence, unspecified, uncomplicated: Secondary | ICD-10-CM

## 2021-12-23 DIAGNOSIS — G4700 Insomnia, unspecified: Secondary | ICD-10-CM

## 2021-12-23 DIAGNOSIS — Z9981 Dependence on supplemental oxygen: Secondary | ICD-10-CM

## 2021-12-23 DIAGNOSIS — R600 Localized edema: Secondary | ICD-10-CM

## 2021-12-23 DIAGNOSIS — F1010 Alcohol abuse, uncomplicated: Secondary | ICD-10-CM

## 2021-12-23 DIAGNOSIS — Y92009 Unspecified place in unspecified non-institutional (private) residence as the place of occurrence of the external cause: Secondary | ICD-10-CM

## 2021-12-23 DIAGNOSIS — J449 Chronic obstructive pulmonary disease, unspecified: Secondary | ICD-10-CM

## 2021-12-23 DIAGNOSIS — G8929 Other chronic pain: Secondary | ICD-10-CM

## 2021-12-23 DIAGNOSIS — R2689 Other abnormalities of gait and mobility: Secondary | ICD-10-CM

## 2021-12-23 DIAGNOSIS — R2681 Unsteadiness on feet: Secondary | ICD-10-CM

## 2021-12-23 DIAGNOSIS — M48061 Spinal stenosis, lumbar 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 claudication: Secondary | ICD-10-CM

## 2021-12-23 HISTORY — DX: Spinal stenosis, lumbar 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 claudication: M48.061

## 2021-12-23 HISTORY — DX: Dependence on supplemental oxygen: Z99.81

## 2021-12-23 HISTORY — DX: Unspecified place in unspecified non-institutional (private) residence as the place of occurrence of the external cause: Y92.009

## 2021-12-23 HISTORY — DX: Unsteadiness on feet: R26.81

## 2021-12-23 LAB — BASIC METABOLIC PANEL
ANION GAP: 12 mmol/L (ref 10–22)
BUN (UREA NITROGEN): 19 mg/dL — ABNORMAL HIGH (ref 7–18)
CALCIUM: 9.2 mg/dL (ref 8.5–10.5)
CARBON DIOXIDE: 31 mmol/L (ref 21–32)
CHLORIDE: 101 mmol/L (ref 98–107)
CREATININE: 0.8 mg/dL (ref 0.4–1.2)
ESTIMATED GLOMERULAR FILT RATE: >60 mL/min (ref >60)
Glucose Random: 116 mg/dL (ref 74–160)
POTASSIUM: 4.6 mmol/L (ref 3.5–5.1)
SODIUM: 144 mmol/L (ref 136–145)

## 2021-12-23 LAB — ADD ON CAN'T BE DONE CHEMISTRY

## 2021-12-23 LAB — TOTAL IRON BINDING CAPACITY: TOTAL IRON BIND CAPACITY CALC: 409 ug/dL (ref 280–504)

## 2021-12-23 LAB — IRON: IRON: 54 ug/dL (ref 50–170)

## 2021-12-23 LAB — VITAMIN B12: VITAMIN B12: 287 pg/mL (ref 232–1245)

## 2021-12-23 LAB — PHOSPHORUS MAGNESIUM
MAGNESIUM: 1.9 mg/dL (ref 1.6–2.6)
PHOSPHORUS: 3.8 mg/dL (ref 2.5–4.9)

## 2021-12-23 LAB — FERRITIN: FERRITIN: 78 ng/mL (ref 13–150)

## 2021-12-23 LAB — FOLATE: FOLATE: 19.4 ng/mL (ref 4.6–?)

## 2021-12-23 MED ORDER — NYSTATIN 100000 UNIT/GM EX POWD
Freq: Two times a day (BID) | CUTANEOUS | Status: DC | PRN
Start: 2021-12-23 — End: 2022-01-06
  Filled 2021-12-23: qty 30

## 2021-12-23 MED ORDER — NYSTATIN 100000 UNIT/GM EX POWD
Freq: Two times a day (BID) | CUTANEOUS | Status: DC
Start: 2021-12-23 — End: 2021-12-23

## 2021-12-23 MED ORDER — OTHER MEDICATION
0 refills | Status: DC
Start: 2021-12-23 — End: 2022-01-04

## 2021-12-23 MED ORDER — TORSEMIDE 20 MG PO TABS
120.0000 mg | ORAL_TABLET | Freq: Three times a day (TID) | ORAL | Status: DC
Start: 2021-12-24 — End: 2022-01-06
  Administered 2021-12-24 – 2022-01-06 (×41): 120 mg via ORAL
  Filled 2021-12-23 (×41): qty 1

## 2021-12-23 MED ORDER — METOLAZONE 5 MG PO TABS
5.0000 mg | ORAL_TABLET | Freq: Every day | ORAL | Status: DC
Start: 2021-12-24 — End: 2022-01-06
  Administered 2021-12-24 – 2022-01-06 (×14): 5 mg via ORAL
  Filled 2021-12-23 (×13): qty 1

## 2021-12-23 MED ORDER — OTHER MEDICATION
0 refills | Status: DC
Start: 2021-12-23 — End: 2022-04-12

## 2021-12-23 NOTE — Initial Assessments (Signed)
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 oz)   SpO2 95%   BMI 52.10 kg/m     Physical Exam:  Reviewed and agree with physical exam from inpatient medicine hospitalization      Risk Assessment:  Violence Risk  Current Attempt to Harm: No      DSM-V    Primary Diagnosis: Major depressive disorder, recurrent episode, severe   ([name])   Secondary Diagnosis: Borderline personality disorder ([name])   General medical conditions: Seizure Disorder, Hyperlipidemia, Diabetes   Mellitus, Type II, Coronary Artery Disease, Chronic Pain, Chronic   Obstructive Pulmonary Disease  Psychosocial and Contextual Factors: Housing problems, Problems related to   social environment, Problems with access to health care services, Problems   with primary support group               Case Formulation  Assessment/Formulation (including risk assessment): Azalynn Krumholz is a 51 year old English-speaking woman with longstanding psych history, including chart diagnoses of PTSD, SCAD, BPAD, and PSUD, multiple hospitalizations starting in childhood, on antipsychotic medication since age 83, multiple prior suicide attempts and SIB requiring medical attention, severe head trauma and seizure d/o, and complex medical illness, who lives in a group home, presenting to ED with increasing suicidal ideation, hallucinations, and trauma reexperiencing symptoms initially admitted to medicine because  of hypoxia and transferred to geriatric psychiatry because of ongoing suicidality.     Diagnostic uncertainty given overlap in chart diagnoses.  Patient identifies her most concerning symptoms are those associated with PTSD, namely nightmares, intrusive memories.  Visual hallucinations may be decompensation of SCAD, trauma-related (e.g. flashbacks), major depressive episode with psychotic features, or transient psychosis in the setting of stress as seen in some personality disorders.  Memory deficits on exam may be due to severe history of head injury, polypharmacy and substance use, or vitamin deficiency.    In terms of risk, patient endorses SI with intent and method (states she will jump in front of a train at the nearest station as soon as she is discharged) and believes that ending her life will reunite her and have been with her deceased son.  Her trauma-reexperiencing and psychotic symptoms related to her son are heightening her risk of suicide.  Additional risk factors include  history of multiple attempts, recent SIB requiring medical attention, history of head trauma, comorbid substance use, preparatory behaviors/attempt in ED, impulsivity, perceived lack of social support, and chronic pain.  There are a few mitigating risk factors and patient is not agreeable to safety planning.  For this reason, patient meets criterion 1 of section 12 and inpatient psychiatric care is necessary to optimize medications, provide diagnostic clarification, and mitigate risk of harm to self.      Treatment Recommendations/Rationale for Recommended Level of Care:   Legal status: condition voluntary  Full code  SI/HI  #h/o SA  -Cont. prozac 20 mg po daily    Anxiety:  Cont. Lorazepam 1 mg po BID    #Dyspnea on exertion  #Peripheral edema  #HFpEF (EF 70%, no WMA on TTE 12/07/21)  #OHS, OSA  #COPD/asthma  -feels dyspnea, peripheral edema worse since recent hospital discharge, missing about ~ 1d of medication  -possibly w/ mild  CHF exacerbation from missed diuretic doses  -no e/o infection or copd exacerbation  -increase torsemide to 120 bid (similar to recent admission, requiring an increased dose briefly) (day team to follow-up daily to adjust dose prn. Had been discharged on torsemide 100 bid)  -I/Os, daily weights, vitals  -SpO2 goal 88-92, if SpO2 >90, does not need supplemental O2 (may need it only overnight 1-2L)  -admitted on ~2 L supplemental O2 w/ SpO2 >92 (baseline is closer to ~90 on chart review); on review of current presentation, lowest O2 on flow sheet was 91 on room air  -c/w ICS/LABA, SABA prn  -f/u nt-pro-bnp and repeat basic labs    #burning sensation in lungs/right back  -suspect due to recent cocaine use (onset after hospital d/c, cocaine use)  -no concerning skin changes on back  -no concerning urinary sxs, f/u ua  -trial of gi cocktail and lidocaine patch    #complex chronic pain syndrome  #lumbar spinal stenosis w/ sciatica  #L knee pain, improved  #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  -had mechanical fall while on recent psych admission resulting in increased L knee pain and difficulty ambulating, requiring transfer to medicine  -evaluated by ortho who felt MRI L knee findings appeared chronic  -c/w tylenol, naproxen, suboxone, gabapentin, robaxin    #PSUD  -recovery coach consult  -uds + for marijuana, cocaine, suboxone  -c/w suboxone tid (takes also for chronic pain)  -cessation, NRT    #Seizure d/o  -c/w keppra, trileptal    #HLD  -c/w statin    #h/o HCV  -recent viral load undetectable, cleared on her own      #housing instability  -social work consult    Diet- cardiac  DVT prophylaxis- sch  Med rec- complete w/ recent d/c med list  PCP- unclear; possibly Jimmye Norman, FNP  HCP- not on file  Code- full     This writer spent more than 30 minutes on evaluation of the patient, review with medical team, and preparing admission documentation.  More than 50% of the time was spent on counseling and  coordination of care.    I have reviewed all above information with patient.

## 2021-12-23 NOTE — Group Note (Signed)
OT Psych Group Documentation    Topics: Feel Good      Occupations Addressed: Activities of daily living (functional mobility), Instrumental activities of daily living (health management & maintenance), Leisure, Social participation      Skills addressed: Fine/gross motor skills, social interaction skills, process skills, emotional regulation, mental functions           Date of group: 12/23/2021  Start Time: 1015  End Time: 1100    Attendance: [place] [id number]

## 2021-12-23 NOTE — Group Note (Signed)
OT Psych Group Documentation    Topics: Breakfast Bites      Occupations Addressed: Activities of Daily living, Social participation, Instrumental activities of daily living (health management & maintenance)     Skills addressed: Fine/gross motor skills, process skills, social interaction skills, behavioral control, mental functions (cognitive, perceptual, affective)                   Date of group: 12/23/2021  Start Time: 0800  End Time: 0845    Attendance: Declined    [place] [id number]

## 2021-12-23 NOTE — Plan of Care (Addendum)
96.6 92 18 137/85 02 SAT 93 % on 2 L NC. No noted Covid or GI s/sx. Pt. Continues on constant observation for SI. Pt. c/o increased anxiety, medicated with Ativan 1 mg po 1800. Pt. noted irritable, asking why she needed a one to one. Nursing support given. Ate 100 % of dinner. Requested nicotine gum x 2. Pt. requested ice pack for feet. Applied to right and left feet. Pt. c/o blister on left inside of foot. Noted small  dryed blister.  Pt. denied bandage to cover. Noted 1-2 plus pedal edema.  Medication compliant. Pt. Spent most of the evening resting in bed. Continent of urine, no BM reported. No voiced SI/HI,AH/VH. No safety issues noted. Will monitor. Nystatin powder applied to reddened abdominal folds.

## 2021-12-23 NOTE — Case Mgmt Note (Signed)
Per Chart reveiw patient was transferred to Inpatient Psych Floor [facility]. No cM needs noted
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
## 2021-12-23 NOTE — Initial Assessments (Signed)
S: "Sure, I will try."    O: Please see vital/pain signs document flow sheet. PT objective findings as below.     12/23/21 0950   Language Information   Language of Care English   Evaluation Type   Evaluation Type Initial Evaluation   Rehab Discipline   Rehab Discipline PT   Safety Devices   Type of Devices Call bell in place   Weight Bearing Status   RLE FWB   LLE FWB   RUE FWB   LUE FWB   Services prior to admission?   Type of Home Care Services None  (lives in group home)   Premorbid Mobility   Transfers Independent   Walking Independent;Community distances   Walking assistive devices used Rollator   Stair negotiation Unable   ADL / IADL Baseline Status   ADL/IADL Baseline Status Yes   ADL's/IADL's   Dressing Independent   Bathing   (sponge bathes, unable to wash hair)   Mudlogger Required assistance   Premorbid Sensory   Hearing Normal   Premorbid Cognition   Cognition Intact   Premorbid Communication   Communication Normal   Living Situation   Living Setting Homeless/transient  (also reports that she lives ina group home but does not like it there)   Strengths   Strengths Premorbid level of function   Barriers   Barriers Comorbidities;Assets/ limited resources;Attitude of self   Expression   Primary Mode of Expression Verbal   Verbal Expression   Aphasia None present   Auditory Comprehension   Commands WFL   Conversation Simple   Perception   Inattention/Neglect Appears intact   Initiation Appears intact   Motor Planning Appears intact   Perseveration Not present   Cognition   Attention [facility]   Decision Making Impaired   Follows Commands WFL   Processing Skills Impaired   Judgement X   Insight Poor   Task Initiation WFL   Task Completion [facility]   Processing Speed WFL   RUE Assessment   RUE Assessment X  (chronic shoulder limitations 2/2 past fracture)   LUE Assessment   LUE Assessment X  (Chronic shoulder ROM limitations 2/2 past fracture)   RLE Assessment   RLE Assessment WFL   LLE  Assessment   LLE Assessment X  (s/p fall with lateral and medial meniscus sprain and tear, complaining of pain)   Strength LLE   LLE Overall Strength Deficits;Due to pain   Mobility / Balance   Mobility / Balance Yes   Bed Mobility   Supine to Sit Independent   Sit to Supine Independent   Transfers   Transfer Yes   Transfer 1   Transfer From 1 Bed   Transfer Type 1 To and from   Transfer to 1 Stand   Technique 1 Sit to stand;Stand to sit   Transfer Device 1 Rollator (Four wheel walker)   Transfer Level of Assistance 1 Independent   Trials/Comments 1 x1, slow movements   Gait   Gait No  (pt reported she was not up for it, demonstrated L knee trembling)   Activity Tolerance   Activity Tolerance Tolerates < 10 min activity, no significant change in vital signs   Balance   Sitting - Static Sits without support for > 30 sec;Feet supported   Sitting - Dynamic Moves/returns truncal midpoint greater than 2 inches in all planes;Feet supported   Standing - Static Able to maintain 60 sec;Bilateral upper extremity supported   Standing -  Dynamic Lateral lean;Forward lean   Posture Rounded shoulders;Forward head;Kyphosis   Ther Exercise   Therapeutic Exercise? No   Coordination   Gross Motor Performance Observation Weak;Stiff   Plan   Prognosis Fair   PT Frequency 2x/wk   Recommendation   Recommendation Short-term skilled PT   Equipment Recommended Rolling walker (Front wheel walker)     A: Cheryl Zimmerman is a 51 y/o female with PMH sig for HFpEF, COPD/asthma on 2L O2, OSA, OHS, hypothyroidism, seizure disorder, Polysubstance use (cocaine, fentanyl, EtOH, heavy tobacco smoke), complex chronic pain syndrome, lumbar spinal stenosis w/ sciatica, chronic L lateral/medial meniscal tears, BMI 51.5, schizoaffective disorder, bipolar disorder, PTSD, multiple suicide attempts, unstable housing, and recent hospitalization ~3/7 - 3/31 for SI, presented to ED on 4/1 for SI/HI, admitted on 4/2 to medical floor to board awaiting inpatient psych bed,  now admitted to [facility] for further management. Pt looked very sleepy upon arrival, eyes closed majority of session but was sitting at EOB. Pt had rollator at bedside. Per discussion with OT, pt was observed on unit this morning ambulating with rollator with steady gait. When this writer saw pt, she was unable to ambulate and stated she did not feel comfortable doing it. Unclear if this was an accurate representation of pt.     Prior to hospitalization, multiple different reports stated that pt is homeless/transient and resides in a group home but does not like the group home. Pt previously I in basic ADLs, difficulty with IADL's, and amb with Rollator. Pt presented to skilled PT with deficits in balance and endurance as well as weakness in R knee. Pt completed transfers with independence but R knee kept trembling and buckling when standing and when she attempted to amb, she was quite unsteady. Pt would benefit from skilled PT while hospitalized to address deficits in order to maximize functional mobility. Recommending STR at this time based on how pt presented during assessment.     PT STG:  In 6 sessions pt will amb x 15ft with rollator with independence.      P: Recommend PT follow up in hospital at a frequency of 2x/week. Recommend pt D/C to STR with PT follow up.     Cheryl Zimmerman, [place], [place] # [id number]

## 2021-12-23 NOTE — Progress Notes (Signed)
CCM Screening    Pt referred to CCM by Inpatient, reason for referral: ED/inpatient utilization    Chart review done?- CCM Triage Score:     (15+ may meet criteria for CCM enrollment)     ACO: Unknown  Primary Insurance: [employer]    CCM enrolled in the past year? No    Pt is a 51 year old female with recent admission and acute care use due to major depressive disorder who is currently enrolled in [facility].    Assessment:    Patient Enrolled in another care management program. Does not meet criteria for CCM. Please contact current care manager for care coordination and support needs: [employer]  Priority:Routine     Additional Recommendations or Comments:    CCM to send epic message to [employer] contact Tomasa Rand to identify assigned CM.

## 2021-12-23 NOTE — Plan of Care (Addendum)
Problem: Mood-related symptoms  Description: Pt. Is a 51 year old female with chronic mental illness who recently left group home and now homeless. PMH: obesity, COPD, CHF, spinal stenosis. Diagnosis MDD with SI. Pt. Reports no SI on admission.   Goal: Reduction in Intensity and Frequency (Mood)  Description: Pt. Will seek out staff if having any Suicidal ideation on day one of admission. Pt. Will be medication adherent on day one of admission. Pt. Will attend scheduled groups daily on day two of admission.  Outcome: Progressing   Patient was visible ambulating in hallway, declined group. Patient was listening to music in the tablet in her room. Ate 20% of breakfast, 100% of lunch. Compliant with medications. Irritable at times, angry redirected with increased agitation. Ativan 1mg  po given at 13:30 per patient request for anxiety. Patient continues on 02at 2l via nasal canula for SOB. 02 sat 87% R/A. Patient refused all labs , message sent TO DR K.  Patient denies SI/HI, no safety concerns.No Covid Sx noted . Last Bm 12/22/21. Continue with treatment plan.
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
## 2021-12-23 NOTE — Progress Notes (Signed)
12/23/21 1130   Language Information   Language of Care English   Rehab Discipline   Rehab Discipline PT   Recommendation   Recommendation No skilled PT   No Skilled PT At baseline function     This writer had been up on [facility] treating another pt when this Clinical research associate observed Ms. Felix mobilizing independently all over unit with her rollator. This appears to be pts actual ability for functional mobility. Confirmed once again with OT that pt has been ambulating all over unit without difficulty. D/C pt from caseload as she is independently mobilizing with AD.     Lyla Glassing, [place], [place] # [id number]

## 2021-12-23 NOTE — H&P (Signed)
H&P NOTE     Chief Complaint:   No chief complaint on file.      HPI     Cheryl Zimmerman is a 51 year old woman with a PMH significant for schizoaffective disorder, suicidal ideation and multiple suicide attempts, MDD with psychosis, insomnia, housing instability, alcohol and tobacco use disorders (90 year pack history), seizure disorder, multiple falls, hypothyroidism, afib, chronic knee and back pain, chronic L lateral/medial meniscal tears, severe COPD on continuous oxygen, OSA, asthma, morbid obesity, opioid dependence, PTSD, viral hepatitis C, GERD, HLD,  HFpEF who was admitted to [facility] on 12/22/2021 for SI following four days on the medical floor, where she was treated for CHF and COPD  exacerbation. During H&P on 12/23/21 Cheryl Zimmerman was very upset about her recent health issues, especially her recent falls and new oxygen requirement. She endorsed shortness of breath but added "the oxygen doesn't help, it doesn't; make me feel better." She said that she believed needing oxygen continuously would mean she would be confined to a facility and unable to do normal activities in the community ever again. I showed her how her O2 sat and HR improved when she put her nasal cannula on and she agreed it made sense to keep it on for now. She also reported that her legs and feet were uncomfortable from being swollen and that her current dose of torsemide was ineffective because "I'm not peeing a lot like I'm supposed to." Regarding her recent falls, she tearfully recounted how she struggles walking even short distances because her legs are weak and can suddenly buckle under her without warning, as well as her chronic knee pain. She asked if it would be possible to get an electric wheelchair for when she gets out of the hospital.      Review of Systems   Constitutional: Positive for activity change and fatigue. Negative for appetite change, chills, diaphoresis, fever and unexpected weight change.   HENT: Negative for  congestion, dental problem, drooling, ear discharge, ear pain, facial swelling, hearing loss, mouth sores, nosebleeds, postnasal drip, rhinorrhea, sinus pressure, sinus pain, sneezing, sore throat, tinnitus, trouble swallowing and voice change.    Eyes: Negative for photophobia, pain, discharge, redness, itching and visual disturbance.   Respiratory: Positive for cough, chest tightness and shortness of breath. Negative for choking, wheezing and stridor.    Cardiovascular: Positive for leg swelling. Negative for chest pain and palpitations.   Gastrointestinal: Negative for abdominal distention, abdominal pain, anal bleeding, blood in stool, constipation, diarrhea, nausea, rectal pain and vomiting.   Endocrine: Negative for cold intolerance, heat intolerance, polydipsia, polyphagia and polyuria.   Genitourinary: Negative for decreased urine volume, difficulty urinating, dysuria, flank pain, frequency, hematuria and urgency.   Musculoskeletal: Positive for arthralgias, back pain, gait problem, joint swelling and myalgias. Negative for neck pain and neck stiffness.   Skin: Negative for color change, pallor, rash and wound.   Neurological: Positive for seizures and weakness. Negative for dizziness, tremors, syncope, facial asymmetry, speech difficulty, light-headedness, numbness and headaches.   Psychiatric/Behavioral: Positive for dysphoric mood and sleep disturbance. The patient is nervous/anxious.         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     (HFpEF) heart failure with preserved ejection fraction ([name])     MDD (major depressive disorder), recurrent, severe, with psychosis ([name])     Requires continuous at home supplemental oxygen     Spinal stenosis of lumbar 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 claudication     Fall in home     Gait instability      Past Medical History:   Past Medical History:  schizo: Bipolar affective ([name])  No date: Chronic systolic CHF (congestive heart failure) ([name])  No date: Depression  12/23/2021: Fall in home  12/23/2021: Gait instability  12/23/2021: Requires continuous at home supplemental oxygen  depress: Schizo affective schizophrenia ([name])  12/23/2021: Spinal stenosis of lumbar 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   claudication  No date: Substance abuse ([name])    Past Surgical History:   No past surgical history on file.    Medications prior to Admission:   budesonide-formoterol (SYMBICORT) 160-4.5 MCG/ACT inhaler, Inhale 2 puffs into the lungs in the morning and 2 puffs before bedtime., Disp: 1 each, Rfl: 0  atorvastatin (LIPITOR) 80 MG tablet, Take 1 tablet by mouth in the morning., Disp: 30 tablet, Rfl: 0  aspirin 81 MG chewable tablet, Take 1 tablet by mouth in the morning., Disp: , Rfl:   albuterol HFA 108 (90 Base) MCG/ACT inhaler, Inhale 2 puffs into the lungs every 6 (six) hours as needed for Wheezing, Disp: 1 each, Rfl: 0  buprenorphine-naloxone (SUBOXONE) 8-2 MG sublingual tablet, Place 1 tablet under the tongue in the morning and 1 tablet at noon and 1 tablet before bedtime. Max Daily Amount: 3 tablets. Do all this for 7 days., Disp: 21 tablet, Rfl: 0  diclofenac (VOLTAREN) 1 % GEL Gel, Apply 4 g topically in the morning and 4 g at noon and 4 g in the evening and 4 g before bedtime., Disp: 100 g, Rfl: 0  docusate sodium (COLACE) 100 MG capsule, Take 1 capsule by mouth daily as needed for Constipation, Disp: , Rfl:   FLUoxetine (PROZAC) 20 MG capsule, Take 1 capsule by mouth in  the morning., Disp: 30 capsule, Rfl: 0  gabapentin (NEURONTIN) 300 MG capsule, Take 2 capsules by mouth in the morning and 2 capsules at noon and 2 capsules before bedtime., Disp: 180 capsule, Rfl: 0  levETIRAcetam (KEPPRA) 750 MG tablet, Take 1 tablet by mouth in the morning and 1 tablet before bedtime., Disp: 60 tablet, Rfl: 0  levothyroxine (SYNTHROID) 25 MCG tablet, Take 1 tablet by mouth every morning before breakfast, Disp: 30 tablet, Rfl: 0  Loratadine 10 MG CAPS, Take 10 mg by mouth in the morning., Disp: , Rfl:   LORazepam (ATIVAN) 1 MG tablet, Take 1 tablet by mouth 2 (two) times daily as needed for Anxiety  for up to 7 days, Disp: 14 tablet, Rfl: 0  loxapine (LOXITANE) 25 MG capsule, Take 1 capsule by mouth in the morning and 1 capsule before bedtime., Disp: 60 capsule, Rfl: 0  loxapine (LOXITANE) 10 MG capsule, Take 1 capsule by mouth in the morning and 1 capsule before bedtime., Disp: 60 capsule, Rfl: 0  melatonin  3 MG TABS tablet, Take 1 tablet by mouth nightly, Disp: , Rfl:   methocarbamol (ROBAXIN) 750 MG tablet, Take 1 tablet by mouth in the morning and 1 tablet at noon and 1 tablet before bedtime., Disp: , Rfl:   naproxen (NAPROSYN) 500 MG tablet, Take 1 tablet by mouth in the morning and 1 tablet in the evening. Take with meals., Disp: , Rfl:   nicotine (NICODERM CQ) 21 MG/24HR, Place 1 patch onto the skin in the morning., Disp: , Rfl:   nicotine polacrilex (NICORETTE) 4 MG gum, Take 1 each by mouth every 2 (two) hours as needed for Craving (Nicotine), Disp: , Rfl:   ondansetron (ZOFRAN-ODT) 4 MG disintegrating tablet, Take 1 tablet by mouth every 8 (eight) hours as needed  for up to 5 days, Disp: , Rfl:   OXcarbazepine (TRILEPTAL) 150 MG tablet, Take 1 tablet by mouth in the morning and 1 tablet before bedtime., Disp: 60 tablet, Rfl: 0  prazosin (MINIPRESS) 1 MG capsule, Take 3 capsules by mouth nightly, Disp: 90 capsule, Rfl: 0  QUEtiapine (SEROQUEL) 50 MG tablet, Take 1 tablet by mouth at  bedtime, Disp: 30 tablet, Rfl: 0  sennosides (SENOKOT) 8.6 MG tablet, Take 1 tablet by mouth in the morning., Disp: , Rfl:   pantoprazole (PROTONIX) 40 MG tablet, Take 1 tablet by mouth in the morning., Disp: , Rfl:   lidocaine (SALONPAS) 4 % PTCH, Apply 1 patch topically in the morning., Disp: 30 patch, Rfl: 0  acetaminophen (TYLENOL) 325 MG tablet, Take 2 tablets by mouth every 6 (six) hours as needed for Pain, Disp: , Rfl:   torsemide 60 MG TABS, Take 120 mg by mouth in the morning and 120 mg in the evening., Disp: , Rfl:   thiamine (VITAMIN B-1) 100 mg tablet, Take 100 mg by mouth in the morning., Disp: , Rfl:   naloxone (NARCAN) 4 MG/0.1ML spray, 1 spray intranasally in 1 nostril, may repeat using a new device every 2-61mins in alternating nostrils until medical help arrives, Disp: 1 each, Rfl: 0        Allergies:   Review of Patient's Allergies indicates:   Bee venom               Anaphylaxis   Carbamazepine           Hives   Methylprednisolone      Dizziness, Drowsiness    Comment:Also believes syncope. Seems to tolerate             inhalational and epidural steroid.   Nutritional supplem*       Hydroxyzine             Nausea Only    Social History:   Tobacco Use: Social History    Tobacco Use      Smoking status: Every Day        Packs/day: 3.00        Years: 30.00        Pack years: 90        Types: Cigarettes        Passive exposure: Never      Smokeless tobacco: Never    Vaping Use      Vaping status: Not on file     Alcohol:   Alcohol use Yes       Family History: No family history on file.      Intake/Output last 24hours (7a-7a):   No intake/output data recorded.    Vital Signs - Last  24 Hours:   BP: (107-124)/(73-84)   Temp:  [97.6 F (36.4 C)-98.3 F (36.8 C)]   Pulse:  [78-88]   Resp:  [18-20]   SpO2:  [87 %-95 %]     Vital Signs - Last 8 Hours:   BP: (123)/(84)   Temp: --  Pulse: --  Resp:  [18]   SpO2:  [87 %-95 %]     Physical Exam  Constitutional:       General: She is not in acute distress.      Appearance: She is morbidly obese. She is ill-appearing. She is not toxic-appearing or diaphoretic.      Interventions: Nasal cannula in place.   HENT:      Head: Normocephalic and atraumatic.      Right Ear: External ear normal.      Left Ear: External ear normal.      Nose: Nose normal. No congestion or rhinorrhea.      Mouth/Throat:      Mouth: Mucous membranes are moist.      Pharynx: Oropharynx is clear. No oropharyngeal exudate or posterior oropharyngeal erythema.   Eyes:      General: No visual field deficit or scleral icterus.        Right eye: No discharge.         Left eye: No discharge.      Extraocular Movements: Extraocular movements intact.      Conjunctiva/sclera: Conjunctivae normal.      Pupils: Pupils are equal, round, and reactive to light.   Cardiovascular:      Rate and Rhythm: Regular rhythm. Tachycardia present.      Pulses: Normal pulses.   Pulmonary:      Effort: Tachypnea and prolonged expiration present.      Breath sounds: Decreased air movement present. Decreased breath sounds present. No wheezing, rhonchi or rales.   Abdominal:      General: Bowel sounds are normal. There is no distension.      Palpations: Abdomen is soft.      Tenderness: There is no abdominal tenderness.   Musculoskeletal:         General: Normal range of motion.      Cervical back: Normal range of motion and neck supple.      Right lower leg: 2+ Edema present.      Left lower leg: 2+ Edema present.      Right foot: Swelling present.      Left foot: Swelling present.   Skin:     General: Skin is warm and dry.      Capillary Refill: Capillary refill takes 2 to 3 seconds.   Neurological:      Mental Status: She is alert and oriented to person, place, and time.      Cranial Nerves: Cranial nerves 2-12 are intact. No cranial nerve deficit, dysarthria or facial asymmetry.      Sensory: Sensation is intact. No sensory deficit.      Motor: Weakness present.      Coordination: Coordination abnormal.      Gait: Gait abnormal.    Psychiatric:         Behavior: Behavior is cooperative.         Recent labs:  Recent Results (from the past 336 hour(s))   Phosphorus Magnesium    Collection Time: 12/10/21  7:10 AM   Result Value Ref Range    PHOSPHORUS 4.3 2.5 - 4.9 mg/dL    MAGNESIUM 2.0 1.6 - 2.6  mg/dL   Basic Metabolic Panel    Collection Time: 12/10/21  7:10 AM   Result Value Ref Range    SODIUM 140 136 - 145 mmol/L    POTASSIUM 3.9 3.5 - 5.1 mmol/L    CHLORIDE 99 98 - 107 mmol/L    CARBON DIOXIDE 30 21 - 32 mmol/L    ANION GAP 11 10 - 22 mmol/L    CALCIUM 9.8 8.5 - 10.5 mg/dL    Glucose Random 295 74 - 160 mg/dL    BUN (UREA NITROGEN) 28 (H) 7 - 18 mg/dL    CREATININE 1.0 0.4 - 1.2 mg/dL    ESTIMATED GLOMERULAR FILT RATE > 60 >60 ML/MIN   Basic Metabolic Panel    Collection Time: 12/16/21  5:40 PM   Result Value Ref Range    SODIUM 143 136 - 145 mmol/L    POTASSIUM 3.7 3.5 - 5.1 mmol/L    CHLORIDE 98 98 - 107 mmol/L    CARBON DIOXIDE 33 (H) 21 - 32 mmol/L    ANION GAP 13 10 - 22 mmol/L    CALCIUM 9.9 8.5 - 10.5 mg/dL    Glucose Random 621 74 - 160 mg/dL    BUN (UREA NITROGEN) 20 (H) 7 - 18 mg/dL    CREATININE 0.8 0.4 - 1.2 mg/dL    ESTIMATED GLOMERULAR FILT RATE > 60 >60 ML/MIN   Hold Purple Top Tube    Collection Time: 12/16/21  5:40 PM   Result Value Ref Range    HOLD PURPLE TOP TUBE RECEIVED IN HEMATOL    Basic Metabolic Panel    Collection Time: 12/17/21  7:45 AM   Result Value Ref Range    SODIUM 142 136 - 145 mmol/L    POTASSIUM 4.0 3.5 - 5.1 mmol/L    CHLORIDE 98 98 - 107 mmol/L    CARBON DIOXIDE 32 21 - 32 mmol/L    ANION GAP 13 10 - 22 mmol/L    CALCIUM 10.0 8.5 - 10.5 mg/dL    Glucose Random 308 74 - 160 mg/dL    BUN (UREA NITROGEN) 22 (H) 7 - 18 mg/dL    CREATININE 0.8 0.4 - 1.2 mg/dL    ESTIMATED GLOMERULAR FILT RATE > 60 >60 ML/MIN   Phosphorus Magnesium    Collection Time: 12/17/21  7:45 AM   Result Value Ref Range    PHOSPHORUS 4.4 2.5 - 4.9 mg/dL    MAGNESIUM 2.1 1.6 - 2.6 mg/dL   CBC, Platelet & Differential    Collection  Time: 12/18/21  6:40 PM   Result Value Ref Range    WHITE BLOOD CELL COUNT 10.0 4.0 - 11.0 TH/uL    RED BLOOD CELL COUNT 5.03 3.90 - 5.20 M/uL    HEMOGLOBIN 15.5 11.2 - 15.7 g/dL    HEMATOCRIT 65.7 (H) 34.1 - 44.9 %    MEAN CORPUSCULAR VOL 94.2 80.0 - 100.0 fl    MEAN CORPUSCULAR HGB 30.8 26.0 - 34.0 pg    MEAN CORP HGB CONC 32.7 31.0 - 37.0 g/dL    RBC DISTRIBUTION WIDTH STD DEV 51.0 (H) 35.1 - 46.3 fL    PLATELET COUNT 301 150 - 400 TH/uL    MEAN PLATELET VOLUME 10.9 8.7 - 12.5 fL    NEUTROPHIL % 65.9 40.0 - 75.0 %    IMMATURE GRANULOCYTE % 0.5 0.0 - 1.0 %    LYMPHOCYTE % 25.0 15.0 - 54.0 %    MONOCYTE % 7.0 4.0 - 13.0 %  EOSINOPHIL % 1.4 0.0 - 7.0 %    BASOPHIL % 0.2 0.0 - 1.2 %    NRBC % 0.0 0.0 - 0.0 %    ABSOLUTE NEUTROPHIL COUNT 6.6 1.6 - 8.3 TH/uL    ABSOLUTE IMM GRAN COUNT 0.05 0.00 - 0.10 TH/uL    ABSOLUTE LYMPH COUNT 2.5 0.6 - 5.9 TH/uL    ABSOLUTE MONO COUNT 0.7 0.2 - 1.4 TH/uL    ABSOLUTE EOSINOPHIL COUNT 0.1 0.0 - 0.8 TH/uL    ABSOLUTE BASO COUNT 0.0 0.0 - 0.1 TH/uL    ABSOLUTE NRBC COUNT 0.0 0.0 - 0.0 TH/uL   Basic Metabolic Panel    Collection Time: 12/18/21  6:40 PM   Result Value Ref Range    SODIUM 142 136 - 145 mmol/L    POTASSIUM 3.9 3.5 - 5.1 mmol/L    CHLORIDE 101 98 - 107 mmol/L    CARBON DIOXIDE 30 21 - 32 mmol/L    ANION GAP 11 10 - 22 mmol/L    CALCIUM 9.4 8.5 - 10.5 mg/dL    Glucose Random 638 74 - 160 mg/dL    BUN (UREA NITROGEN) 13 7 - 18 mg/dL    CREATININE 0.7 0.4 - 1.2 mg/dL    ESTIMATED GLOMERULAR FILT RATE > 60 >60 ML/MIN   Serum Drug Screen    Collection Time: 12/18/21  6:40 PM   Result Value Ref Range    SALICYLATE < 0.5 3.0 - 20.0 mg/dL    ACETAMINOPHEN < 5 10 - 30 ug/mL    ETHANOL < 10 0 - 10 mg/dL   NT-proBNP    Collection Time: 12/18/21  6:40 PM   Result Value Ref Range    NT-proBNP 665 (H) 0 - 125 pg/mL   COVID-19 Inpatient    Collection Time: 12/18/21  6:58 PM    Specimen: Nasal; Swab   Result Value Ref Range    COVID-19 INPATIENT       Negative for SARS-CoV-2(2019 novel  coronavirus)by PCR  methodology.  Negative results do not preclude 2019-nCoV infection and  should not be used as the sole basis for patient management  decisions. Negative results must be combined with clinical  observations, patient history, and epidemiological  information.  This test has been authorized by the FDA under an Emergency  Use Authorization (EUA) for use by authorized laboratories.     Urine Drug Screen    Collection Time: 12/18/21  8:03 PM   Result Value Ref Range    AMPHETAMINES URINE NEGATIVE CUTOFF 1000 ng/mL    COCAINE METABOLITES URINE POSITIVE (A) CUTOFF 300 ng/mL    OPIATES URINE NEGATIVE CUTOFF 300 ng/mL    BENZODIAZEPINES URINE NEGATIVE CUTOFF 200 ng/mL    CANNABINOIDS URINE POSITIVE (A) CUTOFF 50 ng/mL    ETHANOL URINE NEGATIVE CUTOFF 10 mg/dL    BUPRENORPHINE SCREEN URINE POSITIVE (A) CUTOFF 10 ng/mL    OXYCOD SCRN URINE NEGATIVE CUTOFF 100 ng/mL    FENTANYL URINE NEGATIVE CUTOFF 5 ng/mL    METHADONE URINE NEGATIVE CUTOFF 300 ng/mL    SPECIMEN VALIDITY URINE CREAT 66 >20 mg/dL    SPECIMEN VALIDITY URINE PH 8.6 (H) 5.0 - 8.5    SPEC VALIDITY SPECIFIC GRAVITY 1.011 1.003 - 1.035   CBC with Platelet    Collection Time: 12/21/21  7:25 AM   Result Value Ref Range    WHITE BLOOD CELL COUNT 5.8 4.0 - 11.0 TH/uL    RED BLOOD CELL COUNT 4.69 3.90 - 5.20 M/uL  HEMOGLOBIN 14.2 11.2 - 15.7 g/dL    HEMATOCRIT 24.4 (H) 34.1 - 44.9 %    MEAN CORPUSCULAR VOL 96.2 80.0 - 100.0 fl    MEAN CORPUSCULAR HGB 30.3 26.0 - 34.0 pg    MEAN CORP HGB CONC 31.5 31.0 - 37.0 g/dL    RBC DISTRIBUTION WIDTH STD DEV 52.3 (H) 35.1 - 46.3 fL    PLATELET COUNT 297 150 - 400 TH/uL    MEAN PLATELET VOLUME 11.0 8.7 - 12.5 fL    NRBC % 0.0 0.0 - 0.0 %    ABSOLUTE NRBC COUNT 0.0 0.0 - 0.0 TH/uL   Basic Metabolic Panel    Collection Time: 12/21/21  7:25 AM   Result Value Ref Range    SODIUM 142 136 - 145 mmol/L    POTASSIUM 3.9 3.5 - 5.1 mmol/L    CHLORIDE 98 98 - 107 mmol/L    CARBON DIOXIDE 35 (H) 21 - 32 mmol/L    ANION GAP 9  (L) 10 - 22 mmol/L    CALCIUM 9.5 8.5 - 10.5 mg/dL    Glucose Random 010 74 - 160 mg/dL    BUN (UREA NITROGEN) 18 7 - 18 mg/dL    CREATININE 0.8 0.4 - 1.2 mg/dL    ESTIMATED GLOMERULAR FILT RATE > 60 >60 ML/MIN   Phosphorus Magnesium    Collection Time: 12/21/21  7:25 AM   Result Value Ref Range    PHOSPHORUS 5.1 (H) 2.5 - 4.9 mg/dL    MAGNESIUM 1.8 1.6 - 2.6 mg/dL   NT-proBNP    Collection Time: 12/21/21  7:25 AM   Result Value Ref Range    NT-proBNP 63 0 - 125 pg/mL   Basic Metabolic Panel    Collection Time: 12/22/21  7:00 AM   Result Value Ref Range    SODIUM 144 136 - 145 mmol/L    POTASSIUM 4.6 3.5 - 5.1 mmol/L    CHLORIDE 101 98 - 107 mmol/L    CARBON DIOXIDE 31 21 - 32 mmol/L    ANION GAP 12 10 - 22 mmol/L    CALCIUM 9.2 8.5 - 10.5 mg/dL    Glucose Random 272 74 - 160 mg/dL    BUN (UREA NITROGEN) 19 (H) 7 - 18 mg/dL    CREATININE 0.8 0.4 - 1.2 mg/dL    ESTIMATED GLOMERULAR FILT RATE > 60 >60 ML/MIN   Phosphorus Magnesium    Collection Time: 12/22/21  7:00 AM   Result Value Ref Range    PHOSPHORUS 3.8 2.5 - 4.9 mg/dL    MAGNESIUM 1.9 1.6 - 2.6 mg/dL   Iron    Collection Time: 12/22/21  7:00 AM   Result Value Ref Range    IRON 54 50 - 170 ug/dL   Total Iron Binding Capacity    Collection Time: 12/22/21  7:00 AM   Result Value Ref Range    TOTAL IRON BIND CAPACITY CALC 409 280 - 504 ug/dL   Ferritin    Collection Time: 12/22/21  7:00 AM   Result Value Ref Range    FERRITIN 78 13 - 150 ng/mL   COVID-19 Antigen    Collection Time: 12/22/21  3:28 PM    Specimen: Nasal; Swab   Result Value Ref Range    COVID-19 ANTIGEN Negative for SARS-CoV-2 Antigen.      Imaging:   XR Chest 2 views  Status: Final result     PACS Images    Show images for XR Chest 2 views  Study Result  Order #: [id number]   Accession #: [id number]  Narrative  TECHNIQUE: Chest, 2 views     INDICATION: Dyspnea     COMPARISON: November 24, 2021     FINDINGS:     Quality: Degraded by body habitus.     Lungs: The lungs are clear.     Pleura: There is no  pleural effusion or pneumothorax.     Heart: Cardiac silhouette borderline enlarged.     Mediastinum/hila: Unremarkable     Bones and Soft Tissues: Unremarkable     IMPRESSION:     Suboptimal exam without evidence of acute cardiopulmonary pathology.           Reviewed and Electronically Signed by: Silva Bandy   Signed Date/Time: 12-21-2021 14:23:55         ASSESSMENT/PLAN    #COPD: Plan: Continue supplemental oxygen 2 Liters via NC continuously. Symbicort 160-4.5 2 puffs BID and albuterol PRN. DME for discharge order for portable oxygen concentrator and home concentrator.     #Gait instability, #lower extremity weakness, #Falls: Plan: PT consult and DME order for bariatric power wheelchair.      #HFpEF, #Lower extremity edema: Plan: Increase Torsemide from 120 mg BID to 120 mg TID.     #Seizure disorder: Plan: Continue Keppra 750 mg BID.    #Hypothyroidism: Plan: Continue Synthroid 25 mg oral daily and re-check TSH if pt is amendable later in admission.     #Chronic pain: Plan: Continue naproxen 500 mg twice daily, Suboxone 8-2 mg daily, gabapentin 600 mg 3 times daily, Robaxin 750 mg 3 times daily, Tylenol 650 mg nightly as needed, diclofenac topical 3 times daily as needed.    #Alcohol use disorder: Plan: Continue supplemental thiamine. Will check HFTs, B12, B6, folate, vitamin A, vitamin D and zinc if pt allows later in admission.     #Tobacco nicotine dependence: Plan: Continue Nicoderm patch 21 mg/24 hr once daily.    #HLD: Plan: Continue Atorvastatin 80 mg daily    #GERD: Plan:Continue pantoprazole 40 mg daily    #Insomnia: Plan: Continue melatonin 3 mg at bedtime      SIGNED Georgianne Fick, CNP, 12/23/2021    Nurse Practitioner   Hospitalist Division  [facility]   [address] [place] [address] [place] [address]     Phone: [phone] (fastest) or [phone]     Pager: [phone]    Email: [email or web address]

## 2021-12-23 NOTE — Progress Notes (Signed)
Pt received sleeping in bed at the start of shift. Continue on 02 at 2 L/min, 02sat  92 %. Pt denies SI/HI/AH/VH, sob or respiratory distress. Head of bed up as tolerated. Last BM reported on 12/20/21. Independent with bathroom using walker, 2 + edema persists.  Nicorette gum at 5.48 am. No behavior or safety concerns. Will continue to monitor & maintained 15 minutes safety checks. Slept 6.6 hrs

## 2021-12-23 NOTE — Group Note (Signed)
OT Psych Group Documentation    Topics: Project      Occupations Addressed: Leisure, Social participation     Skills addressed: Fine/gross motor skills, process skills, social interaction skills, behavioral control, mental functions                     Date of group: 12/23/2021  Start Time: 1100  End Time: 1145    Attendance: Declined      [place] [id number]

## 2021-12-24 MED ORDER — MELATONIN 3 MG PO TABS
3.0000 mg | ORAL_TABLET | Freq: Every evening | ORAL | Status: DC
Start: 2021-12-24 — End: 2022-01-06
  Administered 2021-12-24 – 2022-01-05 (×13): 3 mg via ORAL
  Filled 2021-12-24 (×13): qty 1

## 2021-12-24 MED ORDER — QUETIAPINE FUMARATE 50 MG PO TABS
50.0000 mg | ORAL_TABLET | Freq: Every evening | ORAL | Status: DC
Start: 2021-12-24 — End: 2022-01-06
  Administered 2021-12-24 – 2022-01-05 (×13): 50 mg via ORAL
  Filled 2021-12-24 (×13): qty 1

## 2021-12-24 MED ORDER — BENZOCAINE-MENTHOL 15-3.6 MG MT LOZG
1.0000 | LOZENGE | OROMUCOSAL | Status: DC | PRN
Start: 2021-12-24 — End: 2022-01-06

## 2021-12-24 MED ORDER — BENZONATATE 100 MG PO CAPS
100.0000 mg | ORAL_CAPSULE | Freq: Three times a day (TID) | ORAL | Status: DC
Start: 2021-12-24 — End: 2022-01-06
  Administered 2021-12-24 – 2022-01-06 (×40): 100 mg via ORAL
  Filled 2021-12-24 (×40): qty 1

## 2021-12-24 MED ORDER — LORAZEPAM 0.5 MG PO TABS
0.5000 mg | ORAL_TABLET | Freq: Two times a day (BID) | ORAL | Status: DC | PRN
Start: 2021-12-24 — End: 2021-12-31
  Administered 2021-12-24 – 2021-12-31 (×11): 0.5 mg via ORAL
  Filled 2021-12-24 (×11): qty 1

## 2021-12-24 MED ORDER — MELATONIN 3 MG PO TABS
3.0000 mg | ORAL_TABLET | Freq: Every evening | ORAL | Status: DC | PRN
Start: 2021-12-24 — End: 2022-01-06
  Administered 2021-12-28: 3 mg via ORAL
  Filled 2021-12-24: qty 1

## 2021-12-24 NOTE — Plan of Care (Signed)
Problem: Mood-related symptoms  Description: Pt. Is a 51 year old female with chronic mental illness who recently left group home and now homeless. PMH: obesity, COPD, CHF, spinal stenosis. Diagnosis MDD with SI. Pt. Reports no SI on admission.   Goal: Reduction in Intensity and Frequency (Mood)  Description: Pt. Will seek out staff if having any Suicidal ideation on day one of admission. Pt. Will be medication adherent on day one of admission. Pt. Will attend scheduled groups daily on day two of admission.  Outcome: Progressing   A&O. Patient remained mostly in her room. Up with a walker to dinning room for dinner. Appetite 80% dinner. 1:1 observation continued for SI. Patient on O2 @ 2L. Sat 93%. No SOB noted. Anxious x 1. Ativan PRN offered. Patient declined and slept. Ativan 0.5 mg PO given at 2100 for anxiety. Nicorette gum given for nicotine craving. Nystatin powder applied to the red groin area. To continue 1:1 observation for SI.

## 2021-12-24 NOTE — Group Note (Signed)
OT Psych Group Documentation    Topics: Project      Occupations Addressed: Leisure, Social participation     Skills addressed: Fine/gross motor skills, process skills, social interaction skills, behavioral control, mental functions                     Date of group: 12/24/2021  Start Time: 1100  End Time: 1145    Attendance: Declined      [place] [id number]

## 2021-12-24 NOTE — Plan of Care (Signed)
Problem: Mood-related symptoms  Description: Pt. Is a 51 year old female with chronic mental illness who recently left group home and now homeless. PMH: obesity, COPD, CHF, spinal stenosis. Diagnosis MDD with SI. Pt. Reports no SI on admission.   Goal: Reduction in Intensity and Frequency (Mood)  Description: Pt. Will seek out staff if having any Suicidal ideation on day one of admission. Pt. Will be medication adherent on day one of admission. Pt. Will attend scheduled groups daily on day two of admission.  Outcome: Progressing   Patient was mostly in her room, declined group The patient was offered the following alternatives to group attendance:  attending to ADLs and individual contact with nurse. Ate 100% of both meals. Patient refused assist with ADL's. Took all her medications. Patient continues on 02 at 2l via nasal canula. Noted to have a coughing spells, tessalon Perle one tab po given at 14:35 with good effect. Irritable at times, denies SI/HI, continues on constant observation due to SI, serious risk for self harm.Seen by NP see new order: 02 at 3L to keep 02sat 90-94%. Patient ambulates in hallway with rolling walker. Nicorette gum given X 2 per patient request. Continue wit treatment plan.

## 2021-12-24 NOTE — Progress Notes (Signed)
PSYCH PROGRESS NOTE    Identifying Data:  Cheryl Zimmerman is a 51 year old English-speaking woman with longstanding psych history, including chart diagnoses of PTSD, SCAD, BPAD, and PSUD,multiple hospitalizations starting in childhood, onantipsychotic medication since age 63,multiple prior suicide attempts and SIB requiring medical attention,severe head trauma and seizure d/o, and complex medical illness, including in the ED where she attempted to strangle herself with EKG wires, (04/2021),  PMH notable for chronic systolic congestive heart failure, seizure disorder,on Keppra,pain syndrome, COPD, Bronchial asthma, morbid obesity, who until recently lived in a group home, but left voluntarilyadmittedwith suicidal ideation with a plan to jump in front of the train or overdose on pills that was transferred from [name] because of complaints of suicidality..      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  From the nursing report: The patient is on constant  observation for safety, is on continuous oxygen with saturations in the low 90s on 2 L.  Complained of anxiety, has irritable edge.  Not happy about having constant observation.  Significant shortness of breath while walking.  Complained of a blister on left foot which is dry.  Complained of back pain and is being.  Slept 7.5 hours after taking as needed of Seroquel and has been sleeping several hours throughout the day.  With appetite, she eats 100% of meals.  No groups.  Mostly in her bed resting.  The patient was seen during morning rounds together with the social worker.  Presented in [place], drinking morning coffee.  Went with [place] to the interview room and became very short of breath, oxygen level 88.  The patient is unsteady and has difficulty walking.  She is extremely morbidly obese.  Reported her mood is better today, had full affect.  Denied suicidal and homicidal thoughts today is open to all suggestions of the team for placement.  Explained the circumstances of leaving the group home as she stated that she felt so angry and abused that was afraid that she will hurt somebody there.  Spoke about being angry for the murderers of her son and feeling that that is a normal reaction.  The Clinical research associate discussed with the PT yesterday consult, according to the report:Pt presented to skilled PT with deficits in balance and endurance as well as  weakness in R knee. Pt completed transfers with independence but R knee kept trembling and buckling when standing and when she attempted to amb, she was quite unsteady. Pt would benefit from skilled PT while hospitalized to address deficits in order to maximize functional mobility. Recommending STR at this time based on how pt presented during assessment.   The patient is homeless at this time which will complicate the placement and STIR search.  Allergies:  Review of Patient's Allergies indicates:   Bee venom               Anaphylaxis   Carbamazepine           Hives    Methylprednisolone      Dizziness, Drowsiness    Comment:Also believes syncope. Seems to tolerate             inhalational and epidural steroid.   Nutritional supplem*       Hydroxyzine             Nausea Only    Scheduled Meds:  Current Facility-Administered Medications   Medication Dose Route Frequency Last Admin    melatonin tablet 3 mg  3 mg Oral Nightly      metolazone (ZAROXOLYN) tablet 5 mg  5 mg Oral Daily 5 mg at 12/24/21 0829    torsemide (DEMADEX) tablet 120 mg  120 mg Oral TID 120 mg at 12/24/21 0829    atorvastatin (LIPITOR) tablet 80 mg  80 mg Oral Daily 80 mg at 12/24/21 0828    budesonide-formoterol (SYMBICORT) 160-4.5 MCG/ACT inhaler 2 puff  2 puff Inhalation 2 times daily 2 puff at 12/24/21 0854    buprenorphine-naloxone (SUBOXONE) 8-2 MG SL tablet 1 tablet  8 mg Sublingual TID 1 tablet at 12/24/21 0829    gabapentin (NEURONTIN) capsule 600 mg  600 mg Oral TID 600 mg at 12/24/21 0829    levETIRAcetam (KEPPRA) tablet 750 mg  750 mg Oral BID 750 mg at 12/24/21 0827    levothyroxine (SYNTHROID) tablet 25 mcg  25 mcg Oral DAILY 25 mcg at 12/24/21 0722    loratadine (CLARITIN) tablet 10 mg  10 mg Oral Daily 10 mg at 12/24/21 0828    loxapine (LOXITANE) capsule 25 mg  25 mg Oral BID 25 mg at 12/24/21 0829    melatonin tablet 3 mg  3 mg Oral Nightly 3 mg at 12/23/21 2013    methocarbamol (ROBAXIN) tablet 750 mg  750 mg Oral TID 750 mg at 12/24/21 0828    naproxen (NAPROSYN) tablet 500 mg  500 mg Oral BID WC 500 mg at 12/24/21 0829    nicotine (NICODERM CQ) 21 MG/24HR 1 patch  1 patch Transdermal Daily 1 patch at 12/24/21 0835    OXcarbazepine (TRILEPTAL) tablet 150 mg  150 mg Oral BID 150 mg at 12/24/21 0829    pantoprazole (PROTONIX) EC tablet 40 mg  40 mg Oral Daily 40 mg at 12/24/21 0829    prazosin (MINIPRESS) capsule 3 mg  3 mg Oral Nightly 3 mg at 12/23/21 2009    sennosides (SENOKOT) tablet 8.6 mg  8.6 mg Oral Daily 8.6 mg at 12/24/21 0829    thiamine (VITAMIN B-1) tablet 100 mg   100 mg Oral Daily 100 mg at 12/24/21 0830    QUEtiapine (SEROquel) tablet 100 mg  100 mg Oral Nightly 100 mg at 12/23/21 2013    aspirin chewable tablet 81 mg  81 mg Oral Daily 81 mg at 12/24/21 1751  FLUoxetine (PROzac) capsule 20 mg  20 mg Oral Daily 20 mg at 12/24/21 0829    lidocaine (SALONPAS) 4 % patch 1 patch  1 patch Topical Q24H 1 patch at 12/24/21 0834         PRN Meds:  Current Facility-Administered Medications   Medication Dose Route Frequency Last Admin    LORazepam  0.5 mg Oral BID PRN      melatonin  3 mg Oral QHS PRN, MR X 1      nystatin   Topical BID PRN Given at 12/23/21 2053    acetaminophen  650 mg Oral Q6H PRN 650 mg at 12/24/21 0005    albuterol HFA  2 puff Inhalation Q6H PRN      diclofenac  2 g Topical TID PRN      docusate sodium  100 mg Oral Q12H PRN      nicotine polacrilex  4 mg Oral Q2H PRN 4 mg at 12/24/21 0858    ondansetron  4 mg Oral Q8H PRN      magnesium hydroxide  30 mL Oral Daily PRN      aluminum-magnesium hydroxide-simethicone  30 mL Oral Q2H PRN           VITALS:  Patient Vitals for the past 24 hrs:   BP Temp Temp src Pulse Resp SpO2   12/24/21 0829 109/75 -- -- -- 18 93 %   12/24/21 0811 103/68 96.6 F (35.9 C) -- 83 18 90 %   12/24/21 0012 -- -- -- 89 -- 94 %   12/23/21 2013 -- -- -- -- 20 --   12/23/21 2009 123/84 -- -- -- -- --   12/23/21 1755 138/82 -- -- 86 18 --   12/23/21 1628 137/85 96.6 F (35.9 C) TEMPORAL 92 18 93 %   12/23/21 1330 -- -- -- -- 18 --       MENTAL STATUS EXAM:   Mental Status Exam   General Appearance: Clean;Dressed in hospital gown/johnny;No acute distress  Behavior: Cooperative  Level of Consciousness: Alert  Orientation Level: Grossly Intact  Attention/Concentration: WNL  Mannerisms/Movements: Abnormal gait  Speech Quality and Rate: WNL  Speech Clarity: Clear  Speech Tone: Normal vocal inflection  Vocabulary/Fund of Knowledge: WNL  Memory: Intact  Thought Process & Associations:  Goal-directed;Linear;Logical;Organized  Dissociative Symptoms: None  Thought Content: No abnormalities reported or observed  Delusions: None  Hallucinations: None  Suicidal Thoughts: Passive thoughts  Homicidal Thoughts: None  Mood: Depressed/Sad  Affect: Congruent with mood;Constricted  Judgment: Fair  Insight: Fair    C-SSRS:  Default Flowsheet Data (most recent)     C-SSRS Frequent Screener - 12/24/21 0005        C-SSRS Frequent Screener    Since last contact, have you actually had thoughts about killing yourself ?  Yes     Have you been thinking about how you might do this?  No     Have you had these thoughts and had some intention of acting on them?  No     Have you started to work out or worked out the details of how to kill yourself? Do you intend to carry out this plan? No     Have you done anything, started to do anything, or prepared to do anything to end your life?  No     C-SSRS Frequent Screener Risk Score Low Risk                 Default Flowsheet Data (  most recent)     C-SSRS Risk Assessment - 12/23/21 1344        C-SSRS Risk Assessment    Suicidal and Self-Injurious Behavior (Past 3 Months) Aborted or Self-Interrupted attempt     Suicidal and Self-Injurious Behavior (Lifetime) Actual suicide attempt;Self-injurious behavior without suicidal intent     Suicidal Ideation Check Most Severe in Past Month Wish to be dead     Activating Events (Recent) Current or pending isolation or feeling alone     Treatment History Hopeless or dissatisfied with treatment;Non-compliant with treatment     Protective Factors (Recent) Identifies reasons for living;Supportive social network or family;Belief that suicide is immoral, high spirituality;Responsibility to family or others, living with family;Fear of death or dying due to pain and suffering     Any suicidal, self-injurious, or aggressive behaviors?  Yes     Describe any suicidal, self-injurious or aggressive behavior (INCLUDE DATES) cutting behavior multiple  episodes, last time during teh last month                 Physical/Somatic Complaints  The patient lists: pain, shortness of breath, unsteady gait.          DSM-V    Primary Diagnosis: Major depressive disorder, recurrent episode, severe   ([name])   Secondary Diagnosis: Borderline personality disorder ([name])   General medical conditions: Seizure Disorder, Hyperlipidemia, Diabetes   Mellitus, Type II, Coronary Artery Disease, Chronic Pain, Chronic   Obstructive Pulmonary Disease  Psychosocial and Contextual Factors: Housing problems, Problems related to   social environment, Problems with access to health care services, Problems   with primary support group           Assessment  Ronnett Beaufort is a 51 year old English-speaking woman with longstanding psych history, including chart diagnoses of PTSD, SCAD, BPAD, and PSUD,multiple hospitalizations starting in childhood, onantipsychotic medication since age 35,multiple prior suicide attempts and SIB requiring medical attention,severe head trauma and seizure d/o, and complex medical illness, who lives in a group home,presenting to ED with increasing suicidal ideation,hallucinations,and trauma reexperiencing symptoms initially admitted to medicine because of hypoxia and transferred to geriatric psychiatry because of ongoing suicidality.     Diagnostic uncertainty given overlapinchart diagnoses. Patient identifies her most concerning symptoms are those associated with PTSD,namely nightmares, intrusive memories.Visual hallucinations may be decompensation of SCAD,trauma-related(e.g. flashbacks),major depressive episode with psychotic features, or transient psychosis in the setting of stress as seen in some personality disorders. Memory deficits on exam may be due to severe history of head injury, polypharmacy and substance use,or vitamin deficiency.    In terms of risk, patient endorses SI with intent and method(states she will jump in front of a train at  the nearest station as soon as she is discharged)and believes that ending her life will reunite her and have been with her deceased son. Her trauma-reexperiencing and psychotic symptoms related to her son are heightening her risk of suicide. Additional risk factors include history of multiple attempts, recent SIB requiring medical attention, history of head trauma, comorbid substance use, preparatory behaviors/attempt in ED,impulsivity,perceived lack of social support, and chronic pain.There are a few mitigating risk factors and patient is not agreeable to safety planning. For this reason, patient meets criterion 1of section 12 and inpatient psychiatric care is necessary to optimize medications, provide diagnostic clarification, and mitigate risk of harm to self.       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 prozac 20  mg po daily    Anxiety:  Cont. Lorazepam 1 mg po BID    #Dyspnea on exertion  #Peripheral edema  #HFpEF (EF 70%, no WMA on TTE 12/07/21)  #OHS, OSA  #COPD/asthma  -feels dyspnea, peripheral edema worse since recent hospital discharge, missing about ~ 1d of medication  -possibly w/ mild CHF exacerbation from missed diuretic doses  -no e/o infection or copd exacerbation  -increase torsemide to 120 bid (similar to recent admission, requiring an increased dose briefly) (day team to follow-up daily to adjust dose prn. Had been discharged on torsemide 100 bid)  -I/Os, daily weights, vitals  -SpO2 goal 88-92, if SpO2 >90, does not need supplemental O2 (may need it only overnight 1-2L)  -admitted on ~2 L supplemental O2 w/ SpO2 >92 (baseline is closer to ~90 on chart review); on review of current presentation, lowest O2 on flow sheet was 91 on room air  -c/w ICS/LABA, SABA prn  -f/u nt-pro-bnp and repeat basic labs    #burning sensation in lungs/right back  -suspect due to recent cocaine use (onset after hospital d/c, cocaine use)  -no concerning skin changes on  back  -no concerning urinary sxs, f/u ua  -trial of gi cocktail and lidocaine patch    #complex chronic pain syndrome  #lumbar spinal stenosis w/ sciatica  #L knee pain, improved  #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  -had mechanical fall while on recent psych admission resulting in increased L knee pain and difficulty ambulating, requiring transfer to medicine  -evaluated by ortho who felt MRI L knee findings appeared chronic, seen by PT who recommended skilled PT and STR  -c/w tylenol, naproxen, suboxone, gabapentin, robaxin    #PSUD  -recovery coach consult  -uds + for marijuana, cocaine, suboxone  -c/w suboxone tid (takes also for chronic pain)  -cessation, NRT    #Seizure d/o  -c/w keppra, trileptal    #HLD  -c/w statin    #h/o HCV  -recent viral load undetectable, cleared on her own      #housing instability  -social work consult    Diet-cardiac  DVT prophylaxis-sch  Med rec- completew/ recent d/c med list  PCP-unclear; possiblyJean Maisie Fus, FNP  HCP-not on file  Code- full       Review with patient: Treatment plan reviewed with the patient.

## 2021-12-24 NOTE — Group Note (Signed)
OT Psych Group Documentation    Topics: Breakfast Bites      Occupations Addressed: Activities of Daily living, Social participation, Instrumental activities of daily living (health management & maintenance)     Skills addressed: Fine/gross motor skills, process skills, social interaction skills, behavioral control, mental functions (cognitive, perceptual, affective)                      Date of group: 12/24/2021  Start Time: 0800  End Time: 0845    Attendance: Declined      [place] [id number]

## 2021-12-24 NOTE — Progress Notes (Signed)
12/24/21 0749   Language Information   Language of Care English   Rehab Discipline   Rehab Discipline PT   General   Missed Treatment Reason Other (Comment)  (Refer to note below)     Another PT consult placed for "Pt is asking to see PT and also wants to be evaluated for a power wheelchair to improve her mobility." Pt was observed on multiple occasions ambulating independently with rollator on unit by myself and other rehab staff. Pts complaints of spinal stenosis are chronic in nature and can be followed up on an outpatient basis. At this time, there is no acute rehab need warranting further PT. As far as power wheelchairs, we do not evaluate pts for powered w/c's. That would have to be followed up with an outside vendor and they are typically quite expensive for a custom one. Some companies have ones available for purchase that do not require customization. Clearing consult at this time.    Crisoforo [place], [place], [place] # [id number]

## 2021-12-24 NOTE — Group Note (Signed)
OT Psych Group Documentation    Topics: Feel Good      Occupations Addressed: Activities of daily living (functional mobility), Instrumental activities of daily living (health management & maintenance), Leisure, Social participation      Group members participated in an easter egg hunt around the unit, requiring fine/gross motor skills, social interaction skills, process skills, emotional regulation, mental functions.      Date of group: 12/24/2021  Start Time: 1015  End Time: 1100    Attendance: Declined, sleeping despite several attempts to arouse. When awake briefly, declined again.    Darcel Bayley, OT [phone]

## 2021-12-24 NOTE — Progress Notes (Addendum)
Pt received awake in bed at the start of shift. Pt went to bathroom using walker with supervision, sob with exertion, deep beathing encouraged. C/o lower back and knee pain 8/10. Prn tylenol and seroquel administered per pt request. Continue on 02 at 2 L/min, 02sat  94 % on 02. Pt denies SI/HI/AH/VH. Last BM reported on 12/20/21.  Am Synthroid not given, pt was sleeping. No behavior or safety concerns. Will continue to monitor & maintain 1:1 constant observation for SI. Slept 6.5 hrs

## 2021-12-24 NOTE — Progress Notes (Addendum)
Progress Note:  Pt was reviewed in clinical meeting this morning. SW and Dr. Arleta Creek met pt in the conference room. Pt ambulated with four wheel rollator with difficulty walking. Pt was drinking coffee in the DR when team arrived. Pt's oxygen levels were checked. They were at 31. Pt reported feeling worried about being able to breathe all the time. Pt appeared to fall sleep during interview. Denies SI/HI. Pt reported feeling better today. Pt shared that she left the group home because she had HI thoughts. Pt reported she is angry at the people who murdered her son and has "hateful and revengeful" thoughts. Pt reported, "I do not want to end up in the streets." Provided validation. Discussed about different possible dispositions, including [facility] and [place]. Pt reported that she has her clothes at [facility] in [place]. Pt asked for help with calling the program to make sure they do not throw out her clothes.    [facility] in [place], [phone]  SW telephoned. Left VM. Provided call back number.    Barriers to Discharge:  -Suicidal ideation  -Homeless  ?  Collaterals:  -[place], Cora Collum, [place], [phone]  -[facility] [place], [place]: [phone], Fax: [place], [place], [phone]  ??  Treatment Plan:  -Meet with patient daily to assess mood and monitor progress.  -Speak with collaterals to gain collateral information.  -Encourage patient to engage in the groups while in the unit.  -Evaluate living situation and support system.  -Coordinate treatment and discharge planning with the outpatient team.?  ?  Etta Grandchild, LCSW

## 2021-12-25 MED ORDER — FLUCONAZOLE 100 MG PO TABS
200.00 mg | ORAL_TABLET | Freq: Once | ORAL | Status: AC
Start: 2021-12-25 — End: 2021-12-25
  Administered 2021-12-25: 200 mg via ORAL
  Filled 2021-12-25: qty 2

## 2021-12-25 MED ORDER — CYCLOBENZAPRINE HCL 5 MG PO TABS
5.00 mg | ORAL_TABLET | Freq: Once | ORAL | Status: AC
Start: 2021-12-25 — End: 2021-12-25
  Administered 2021-12-25: 5 mg via ORAL
  Filled 2021-12-25: qty 1

## 2021-12-25 MED ORDER — CLOTRIMAZOLE 1 % EX CREA
TOPICAL_CREAM | Freq: Two times a day (BID) | CUTANEOUS | Status: DC
Start: 2021-12-25 — End: 2022-01-06
  Filled 2021-12-25: qty 15

## 2021-12-25 NOTE — Progress Notes (Addendum)
Received in the Quiet Group Room attending Bingo w/ her peers. Alert, calm and cooperative. No SOB voiced. Continued on O2 at 3 L NC. C/O left side chest pain. Psychiatrist made aware, ordered Flexeril 5 mg PO once. She ate supper in group, 100%. Had adequate PO fluids intake. Pt had a shower. Took all medications as scheduled. She requested Nicorette Gum 4 mg for Nicotine Craving. No SI/HI reported. Continued on 1:1 for safety. We will continue to monitor. BP 140/82   Pulse 91   Temp 97.2 ?F (36.2 ?C)   Resp 18   Ht 5\' 9"  (1.753 m)   Wt (!) 157.5 kg (347 lb 3.2 oz)   SpO2 92%   BMI 51.27 kg/m?

## 2021-12-25 NOTE — Progress Notes (Addendum)
Identifying Data:  [name].    INTERVAL HISTORY: According to nursing report and/or patient and/or MAR, no major behavioral or medical events overnight, has appropriate interactions with other patients and staff, compliant with medications, good appetite but only 4 hours sleep overnight.  After the interview, in the evening I was paged that patient is crying, c/o left sided chest pain. Patient points to left lateral ribs where she has sharp pain that is reproducible; says she has never had that pain before; worried that it has something to do with her congestive heart failure; reassured her that it is likely musculoskeletal, ordered one time dose of Flexeril.    VITALS:  BP 140/82   Pulse 91   Temp 97.6 ?F (36.4 ?C) (Temporal)   Resp 18   Ht 5\' 9"  (1.753 m)   Wt (!) 157.5 kg (347 lb 3.2 oz)   SpO2 92%   BMI 51.27 kg/m?    Pain Score: 0 (0/10)    Scheduled Meds:  ? clotrimazole   Topical BID   ? melatonin  3 mg Oral Nightly   ? QUEtiapine  50 mg Oral Nightly   ? benzonatate  100 mg Oral TID   ? metOLazone  5 mg Oral Daily   ? torsemide  120 mg Oral TID   ? atorvastatin  80 mg Oral Daily   ? budesonide-formoterol  2 puff Inhalation 2 times daily   ? buprenorphine-naloxone  8 mg Sublingual TID   ? gabapentin  600 mg Oral TID   ? levETIRAcetam  750 mg Oral BID   ? levothyroxine  25 mcg Oral DAILY   ? loratadine  10 mg Oral Daily   ? loxapine  25 mg Oral BID   ? melatonin  3 mg Oral Nightly   ? methocarbamol  750 mg Oral TID   ? naproxen  500 mg Oral BID WC   ? nicotine  1 patch Transdermal Daily   ? OXcarbazepine  150 mg Oral BID   ? pantoprazole  40 mg Oral Daily   ? prazosin  3 mg Oral Nightly   ? sennosides  8.6 mg Oral Daily   ? thiamine  100 mg Oral Daily   ? aspirin  81 mg Oral Daily   ? FLUoxetine  20 mg Oral Daily   ? lidocaine  1 patch Topical Q24H       PRN Meds:  Current Facility-Administered Medications   Medication Dose Route Frequency Last Admin   ? LORazepam  0.5 mg Oral BID  PRN 0.5 mg at 12/25/21 0325   ? melatonin  3 mg Oral QHS PRN, MR X 1     ? benzocaine-menthol  1 lozenge Buccal Q2H PRN     ? nystatin   Topical BID PRN Given at 12/24/21 1645   ? acetaminophen  650 mg Oral Q6H PRN 650 mg at 12/25/21 1106   ? albuterol HFA  2 puff Inhalation Q6H PRN     ? diclofenac  2 g Topical TID PRN     ? docusate sodium  100 mg Oral Q12H PRN 100 mg at 12/25/21 1356   ? nicotine polacrilex  4 mg Oral Q2H PRN 4 mg at 12/25/21 1652   ? ondansetron  4 mg Oral Q8H PRN     ? magnesium hydroxide  30 mL Oral Daily PRN 30 mL at 12/25/21 1356   ? aluminum-magnesium hydroxide-simethicone  30 mL Oral Q2H PRN 30 mL  at 12/25/21 1106         Mental Status Examination Notable Findings:  General appearance: overweight middle aged woman. Behavior: cooperative, polite  Alertness: awake, alert.   Memory and orientation: at least grossly intact, oriented to place, time, situation.  Speech is fluent, coherent, with age-appropriate rate, prosody, volume. Vocabulary was not assessed.  Mannerism/movements: Psychomotor activities are normal for age; no mannerisms.  Thought process is logical, linear.   Thought content and delusions: Not obviously paranoid, there is no formal thought disorder.   Hallucinations: Not distracted or preoccupied.  Mood: "doing better ... feel positive ... little bit depressed". Affect is constricted but reactive.   No suicidal ideation; future oriented. Hopes to get into an adult foster home: "the unity of family would be good for me"  No homicidal thoughts.  Insight and judgment are limited.      Review of Patient's Allergies indicates:   Bee venom               Anaphylaxis   Carbamazepine           Hives   Methylprednisolone      Dizziness, Drowsiness    Comment:Also believes syncope. Seems to tolerate             inhalational and epidural steroid.   Nutritional supplem*       Hydroxyzine             Nausea Only      C-SSRS:  C-SSRS Frequent Screener  Since last contact, have you actually  had thoughts about killing yourself ? : No  Have you been thinking about how you might do this? : No  Have you had these thoughts and had some intention of acting on them? : No  Have you started to work out or worked out the details of how to kill yourself? Do you intend to carry out this plan?: No  Have you done anything, started to do anything, or prepared to do anything to end your life? : No  C-SSRS Frequent Screener Risk Score: No Risk    C-SSRS Risk Assessment  Suicidal and Self-Injurious Behavior (Past 3 Months): Aborted or Self-Interrupted attempt (2022/01/18 1344)  Suicidal and Self-Injurious Behavior (Lifetime): Actual suicide attempt;Self-injurious behavior without suicidal intent (01-18-2022 1344)  Suicidal Ideation Check Most Severe in Past Month: Wish to be dead (01/18/22 1344)  Activating Events (Recent): Current or pending isolation or feeling alone (01/18/2022 1344)  Treatment History: Hopeless or dissatisfied with treatment;Non-compliant with treatment (01/18/22 1344)  Clinical Status (Recent): Hopelessness;Chronic physical pain or other acute medical problem (HIV/AIDS, COPD, cancer, etc.) (12/22/21 1702)  Protective Factors (Recent): Identifies reasons for living;Supportive social network or family;Belief that suicide is immoral, high spirituality;Responsibility to family or others, living with family;Fear of death or dying due to pain and suffering (Jan 18, 2022 1344)  Any suicidal, self-injurious, or aggressive behaviors? : Yes (01-18-2022 1344)  Describe any suicidal, self-injurious or aggressive behavior (INCLUDE DATES): cutting behavior multiple episodes, last time during teh last month (01/18/2022 1344)      Assessment and plan:    Primary diagnosis:  MDD (major depressive disorder), recurrent, severe, with psychosis     Continue current treatment.    I spent 25 minutes total, including non-face to face time (as reviewing records and documenting) on this date with this patient's care.      This font size  and color are used for regulatory/compliance elements.

## 2021-12-25 NOTE — Group Note (Signed)
OT Psych Group Documentation    Topics: Breakfast Bites      Occupations Addressed: Activities of Daily living, Social participation, Instrumental activities of daily living (health management & maintenance)     Skills addressed: Fine/gross motor skills, process skills, social interaction skills, behavioral control, mental functions (cognitive, perceptual, affective)                     Date of group: 12/25/2021  Start Time: 0800  End Time: 0900    Attendance: Came late and Less than half  Participation/Patient Response: Asked questions related to topic, Attentive/able to focus, Contributed to discussion, Identified future goals, Interactive with others and Responsive to cues/limits/redirection from staff  Participation within a task: Able to complete task with minimal assistance, Demonstrated ability to focus, Demonstrated ability to problem solve, Demonstrated ability to make decisions and Open to assistance when offered  Treatment Goals Addressed: Development/maintenance of a health lifestyle, Increased communication skills, Increased independence in functioning, Increased problem solving, Increased socializaton/social skills and Verbalization of needs    Comments: Pt appropriately self-advocated for materials PRN. Open to this writer's assistance for menu completion. Noted mild emotional lability at the end of group immediately prior to leaving.    Jaynie Crumble, [place] [address]
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
## 2021-12-25 NOTE — Plan of Care (Addendum)
Problem: Psychosis Symptoms  Goal: Reduction in Intensity and Frequency (Psychosis)  Outcome: Progressing  Received resting peacefully in the bed.  Constant observation r/t safety. No COVID/GI sx. No indications of pain/SI/HI/AH/VH. 0209 administered PRN Tylenol r/t c/o 10/10 generalized pain with concentration to lower back. Minimal effect. 0326 administered PRN Mylanta & Ativan with good effect. 93% on 3L. Dyspnea with ambulation & rest. C/o SOB. Assessment revealed sat remaining @ 93%, no cyanosis, no respiratory distress assessed, & repositioned. BLE edematous 1+ with mild erythema. Pt reports "I have Kapowsin & take Lasix." Calm, pleasant, cooperative upon approach. Ambulated to bathroom near group room as prefers not to navigate around furniture in room. Slow, steady, stooped gait. Voided without difficulty. Requires minimal assist with pants. Patient reports L foot discomfort. Old, dime-sized, open blister known to staff. No s/s infection & does not impede mobility. Applied flexible bandaid. Demonstrates good bed mobility. Requesting A&E Ointment & to speak with Social worker to utilize card to order clothing.     *Sleep hours: 4 total in intermittent episodes  *LBM: 4/3  *Code status: Full  *Legal status: CV

## 2021-12-25 NOTE — Plan of Care (Addendum)
Problem: Psychosis Symptoms  Goal: Reduction in Intensity and Frequency (Psychosis)  Outcome: Progressing   Patient was mostly in her room , ambulates in hallway. Ate 50% of both meals. Compliant with medications. Patient complaints of stomach pain and back pain , Tylenol 1000mg  po given with good effect, Mylanta 53ml po given for Gastric distress at 11:06 am with good effect. Milk of magnesia 30 ml po and colace given for constipation result pending.  Patient continues on 02 at 3L via nasal canula for shortness of breath. No coughing episode noted. No Covid Sx noted. Continue with treatment plan.

## 2021-12-26 NOTE — Progress Notes (Signed)
At the start of shift, pt received awake ambulating in the hallway in no acute distress. Pt alert, calm, pleasant on approach and cooperative with care.  Pt denies any pain, SI/HI/AH/VH. Pt is on 3LMP via NC, O2sat 93%, Pt continues on dedicated staff monitoring for safety. 5:44 am Pt requested and given Nicorette Gum 4 mg for Nicotine Craving . Pt ambulated to the bathroom near group room using her walker with a slow steady gait, voided x3, assisted as needed with clothing. Po fluid intake adquate. BLE edema 1+ with mild erythma, denies any foot pain, CSM + on bil. LE's. LBM 12/25/21. Slept approx. 5 hrs. Will continue to monitor and maintain safety.

## 2021-12-26 NOTE — Plan of Care (Addendum)
Problem: Mood-related symptoms  Description: Pt. Is a 51 year old female with chronic mental illness who recently left group home and now homeless. PMH: obesity, COPD, CHF, spinal stenosis. Diagnosis MDD with SI. Pt. Reports no SI on admission.   Goal: Reduction in Intensity and Frequency (Mood)  Description: Pt. Will seek out staff if having any Suicidal ideation on day one of admission. Pt. Will be medication adherent on day one of admission. Pt. Will attend scheduled groups daily on day two of admission.  Outcome: Progressing   7-3:30PM, Alert and Oriented X3, mood is pleasant, C/O R knee pain, received all scheduled pain meds with + effect, compliant with meds, Oob in the milieu, amb with RW, watching TV in the group, cont on 1:1 for safety, denies SI/HI, AH/VH, L LE +3 edema, Dr Serina Cowper notified, no new order, no S/SX of respiratory distress, patient is on oxygen therapy @ 3L, non-skid socks on for safety, contact precaution maintained secondary + MRSA, LBM 4/8, ate 100% both meals

## 2021-12-26 NOTE — Progress Notes (Signed)
Identifying Data:  [name].    INTERVAL HISTORY: According to nursing report and/or patient and/or MAR, no major behavioral or medical events overnight, has appropriate interactions with other patients and staff, compliant with medications, good appetite, 5 hours sleep overnight, but also naps during the day.  Today she complained of left leg pain to her nurse, by the time of our meeting she had no physical pain.    VITALS:  BP 127/86   Pulse 97   Temp 97.4 ?F (36.3 ?C) (Temporal)   Resp 18   Ht 5\' 9"  (1.753 m)   Wt (!) 157.5 kg (347 lb 3.2 oz)   SpO2 92%   BMI 51.27 kg/m?    Pain Score: 0 (0/10)    Scheduled Meds:  ? clotrimazole   Topical BID   ? melatonin  3 mg Oral Nightly   ? QUEtiapine  50 mg Oral Nightly   ? benzonatate  100 mg Oral TID   ? metOLazone  5 mg Oral Daily   ? torsemide  120 mg Oral TID   ? atorvastatin  80 mg Oral Daily   ? budesonide-formoterol  2 puff Inhalation 2 times daily   ? buprenorphine-naloxone  8 mg Sublingual TID   ? gabapentin  600 mg Oral TID   ? levETIRAcetam  750 mg Oral BID   ? levothyroxine  25 mcg Oral DAILY   ? loratadine  10 mg Oral Daily   ? loxapine  25 mg Oral BID   ? melatonin  3 mg Oral Nightly   ? methocarbamol  750 mg Oral TID   ? naproxen  500 mg Oral BID WC   ? nicotine  1 patch Transdermal Daily   ? OXcarbazepine  150 mg Oral BID   ? pantoprazole  40 mg Oral Daily   ? prazosin  3 mg Oral Nightly   ? sennosides  8.6 mg Oral Daily   ? thiamine  100 mg Oral Daily   ? aspirin  81 mg Oral Daily   ? FLUoxetine  20 mg Oral Daily   ? lidocaine  1 patch Topical Q24H       PRN Meds:  Current Facility-Administered Medications   Medication Dose Route Frequency Last Admin   ? LORazepam  0.5 mg Oral BID PRN 0.5 mg at 12/26/21 1733   ? melatonin  3 mg Oral QHS PRN, MR X 1     ? benzocaine-menthol  1 lozenge Buccal Q2H PRN     ? nystatin   Topical BID PRN Given at 12/24/21 1645   ? acetaminophen  650 mg Oral Q6H PRN 650 mg at 12/25/21 1106   ?  albuterol HFA  2 puff Inhalation Q6H PRN     ? diclofenac  2 g Topical TID PRN     ? docusate sodium  100 mg Oral Q12H PRN 100 mg at 12/25/21 1356   ? nicotine polacrilex  4 mg Oral Q2H PRN 4 mg at 12/26/21 1731   ? ondansetron  4 mg Oral Q8H PRN     ? magnesium hydroxide  30 mL Oral Daily PRN 30 mL at 12/25/21 1356   ? aluminum-magnesium hydroxide-simethicone  30 mL Oral Q2H PRN 30 mL at 12/25/21 1106         Mental Status Examination Notable Findings:  General appearance: overweight middle aged woman. Behavior: cooperative, polite  Alertness: awake, alert.   Memory and orientation: at least grossly intact, oriented  to place, time, situation.  Speech is fluent, coherent, with age-appropriate rate, prosody, volume. Vocabulary was not assessed.  Mannerism/movements: Psychomotor activities are normal for age; no mannerisms.  Thought process is logical, linear.   Thought content and delusions: Not obviously paranoid, there is no formal thought disorder.   Hallucinations: Not distracted or preoccupied.  Mood: "a little depressed". Affect is bright,reactive.   No suicidal ideation; future oriented. Still hopes to get into an adult foster home"  No homicidal thoughts.  Insight and judgment are fair.      Review of Patient's Allergies indicates:   Bee venom               Anaphylaxis   Carbamazepine           Hives   Methylprednisolone      Dizziness, Drowsiness    Comment:Also believes syncope. Seems to tolerate             inhalational and epidural steroid.   Nutritional supplem*       Hydroxyzine             Nausea Only      C-SSRS:  C-SSRS Frequent Screener  Since last contact, have you actually had thoughts about killing yourself ? : No  Have you been thinking about how you might do this? : No  Have you had these thoughts and had some intention of acting on them? : No  Have you started to work out or worked out the details of how to kill yourself? Do you intend to carry out this plan?: No  Have you done anything,  started to do anything, or prepared to do anything to end your life? : No  C-SSRS Frequent Screener Risk Score: No Risk    C-SSRS Risk Assessment  Suicidal and Self-Injurious Behavior (Past 3 Months): Aborted or Self-Interrupted attempt (2022-01-20 1344)  Suicidal and Self-Injurious Behavior (Lifetime): Actual suicide attempt;Self-injurious behavior without suicidal intent (20-Jan-2022 1344)  Suicidal Ideation Check Most Severe in Past Month: Wish to be dead (01/20/22 1344)  Activating Events (Recent): Current or pending isolation or feeling alone (2022/01/20 1344)  Treatment History: Hopeless or dissatisfied with treatment;Non-compliant with treatment (2022-01-20 1344)  Clinical Status (Recent): Hopelessness;Chronic physical pain or other acute medical problem (HIV/AIDS, COPD, cancer, etc.) (12/22/21 1702)  Protective Factors (Recent): Identifies reasons for living;Supportive social network or family;Belief that suicide is immoral, high spirituality;Responsibility to family or others, living with family;Fear of death or dying due to pain and suffering (Jan 20, 2022 1344)  Any suicidal, self-injurious, or aggressive behaviors? : Yes (Jan 20, 2022 1344)  Describe any suicidal, self-injurious or aggressive behavior (INCLUDE DATES): cutting behavior multiple episodes, last time during teh last month (Jan 20, 2022 1344)      Assessment and plan:    Primary diagnosis:  MDD (major depressive disorder), recurrent, severe, with psychosis   Continue current treatment.  No need for constant observation, changed to 5' checks.    I spent 25 minutes total, including non-face to face time (as reviewing records and documenting) on this date with this patient's care.      This font size and color are used for regulatory/compliance elements.

## 2021-12-26 NOTE — Progress Notes (Addendum)
Visible in the milieu interacting w/ her peers. Alert, compliant w/ care. Had Ativan 0.5 mg PRN for Anxiety w/ calming effects. Continued on O2 at 3 L NC. No Respiratory distress reported. Pt is currently on 5 minute safety checks. She ate supper in group, 100%. PO fluids taken well. Took all medications as scheduled. She requested Nicorette Gum 4 mg for Nicotine Craving. No SI/HI reported. We will continue to monitor. BP 127/86   Pulse 97   Temp 97.4 ?F (36.3 ?C) (Temporal)   Resp 18   Ht 5\' 9"  (1.753 m)   Wt (!) 157.5 kg (347 lb 3.2 oz)   SpO2 92%   BMI 51.27 kg/m?

## 2021-12-27 ENCOUNTER — Other Ambulatory Visit (HOSPITAL_BASED_OUTPATIENT_CLINIC_OR_DEPARTMENT_OTHER): Payer: Self-pay

## 2021-12-27 DIAGNOSIS — J449 Chronic obstructive pulmonary disease, unspecified: Secondary | ICD-10-CM

## 2021-12-27 MED ORDER — OTHER MEDICATION
0 refills | Status: DC
Start: 2021-12-27 — End: 2022-01-04

## 2021-12-27 NOTE — Progress Notes (Addendum)
Pt reviewed in interdisciplinary rounds on 12/27/21. Has hx of severe COPD and recent dx of CHF during inpatient medical admission. Psychiatrist Dr. Ronie Spies is concerned that she may need to be discharged soon. She would be discharging to shelter. On 12/26/2021 she had desats as low as 86% when she took off her nasal cannula. Currently 94% on 3 L supplemental O2. Needs portable oxygen concentrator, which I already ordered for discharge. Also needs bariatric power chair, but this will take several months.     Plan: Check on status of order for portable oxygen concentrator with [facility].     Marland Kitchen  SIGNED Georgianne Fick, CNP, 12/27/2021    Nurse Practitioner   Hospitalist Division  [facility]   Un[address] [place] [address] [place] [address]     Phone: [phone] (fastest) or [phone]     Pager: [phone]    Email: [email or web address]

## 2021-12-27 NOTE — Plan of Care (Addendum)
Patient has been in the environment visible, anxious, requested ativan, PRN ativan 0.5 mg given with good effect. The patient attended groups and socialized with peers. Pulse was 110, recheked pulse 110. PRN albuterol given x 1 for SOB with good effect. She also had PRN nicorette gum x 2. Pt took prescribed medication and ate 100% of breakfast and lunch. She refused clotrimazole cream and stated:"I will have it applied tonight". Pt denied SI/HI/AH/VH. She continues on oxygen, O2sat 93%RA and 94% on 3 L via N/C. Will continue to monitor.
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
## 2021-12-27 NOTE — Group Note (Signed)
OT Psych Group Documentation     Topics: Feel Good      Occupations Addressed: Activities of daily living (functional mobility), Instrumental activities of daily living (health management & maintenance), Leisure, Social participation      Skills addressed: Fine/gross motor skills, social interaction skills, process skills, emotional regulation, mental functions         Date of group: 12/27/2021  Start Time: 1015  End Time: 1100    Attendance: Declined    [place] [id number]

## 2021-12-27 NOTE — Group Note (Signed)
OT Psych Group Documentation  Topics: Project      Occupations Addressed: Leisure, Social participation     Skills addressed: Fine/gross IT trainer, process skills, social interaction skills, behavioral control, mental functions                     Date of group: 12/27/2021  Start Time: 1100  End Time: 1145    Attendance: Came late, Left early and Less than half  Participation/Patient Response: Attentive/able to focus, Contributed to discussion, Interactive with others, Mood/affect congruent with topic, Responsive to cues/limits/redirection from staff and Supportive to others  Participation within a task: Able to complete task independently, Demonstrated ability to focus, Demonstrated ability to problem solve, Demonstrated ability to make decisions, Demonstrated positive frustration tolerance, Open to assistance when offered, Organized task skills and Responsive to cues/limits/redirection from staff  Treatment Goals Addressed: Development/maintenance of a health lifestyle and Diagnosis/illness/symptom management    Comments: Came late, left early. Concerned about heavy breathing, O2 sats at 92% on RA. Enjoyed latch hooking while socializing with her peer. Pleasant, calm, oriented, able to make her needs known. She continues to ask for support getting new clothes. Asked for and rec'd a Bible for "studying"    Cheryl Zimmerman, [place]

## 2021-12-27 NOTE — Plan of Care (Signed)
Problem: Mood-related symptoms  Description: Pt. Is a 51 year old female with chronic mental illness who recently left group home and now homeless. PMH: obesity, COPD, CHF, spinal stenosis. Diagnosis MDD with SI. Pt. Reports no SI on admission.   Goal: Reduction in Intensity and Frequency (Mood)  Description: Pt. Will seek out staff if having any Suicidal ideation on day one of admission. Pt. Will be medication adherent on day one of admission. Pt. Will attend scheduled groups daily on day two of admission.  Outcome: Progressing   A&O. Visible in the milieu dinner time. Interacted with select peers. No safety or behavioral issues reported. Appetite 100 % dinner. Patient back to her room at 7 pm. C/O itch in the groin area and Clotrimazole cream applied. HS meds compliant. Nicorette gum given for nicotine craving and Ativan 0.5 mg PO given for increased anxiety. 5 minutes checks continued for safety.

## 2021-12-27 NOTE — Progress Notes (Signed)
Pt received awake resting in bed in no apparent distress. Pt visible at intervals in the Mili[place]. Alert, calm, pleasant, social with peers, and cooperative with care.  Pt denies pain, SI/HI/AH/VH. Continues on O2 at 3LMP via NC,  no respiratory distress noted. 2:21 am PRN  Nicorette Gum 4 mg given for Nicotine Craving per pt request. Pt ambulated to the bathroom near group room using her walker, gait slow and steady, voided x3. Ate snacks, adequate Po fluid taken.  LBM 12/25/21. Slept 4.5 hrs. 5" minutes safety checks maintained. Will continue to monitor and maintain safety.

## 2021-12-27 NOTE — Progress Notes (Addendum)
Progress Note:  Pt was reviewed in clinical meeting this morning. SW and Dr. Jaymes Graff pt in the conference room. Pt ambulated with four wheel rollator. Pt was alert and engaging. Pt reported she is not suicidal and wants to be discharged today to a shelter. Pt stated she had a hard time on Easter because she was not with her son and husband. Pt said she has been socializing with other peers on the unit and participating in groups.    When SW came back to provide information for the [facility] shelter, pt said she changed her mind and said that she needs her oxygen and would like to find a different placement. SW made STR referrals to [facility] contracted facilities, including [facility], [name], [facility], [facility], [facility], [facility], and [facility] and rehab facility. Thus far [facility] and [name] have declined pt. [facility] SN[facility] admissions director reported pt is not clinically appropriate due to needing a bariatric bed. She said in the notes it states pt needs a bariatric wheelchair as well. [facility] SN[facility] admissions director reported she believes hospital beds are bigger then the nursing beds they have. [name] admissions director reported pt is not clinically appropriate for their facility due to pt's prior SI and feel pt would need a secure floor.    [facility] in [place], Program Manager, [place], [phone]  SW telephoned and left VM. SW received a call back. In the VM, she reported that pt is no longer a resident there. Pt was last seen in February. Pt has been going from one hospital to the next. Pt's bed was closed.    Shelter Calls:  [facility], [phone]  SW telephoned and spoke with staff. They informed SW that their shelter is currently at full capability. Staff said SW can call back tomorrow between 8-2pm to check for bed availability. If there is a bed available, pt would need to get  there  by 8pm.?    [address], [phone]  [address] [place], [place] [address]   SW telephoned and spoke with staff. They reported that they have a long waiting list.    [facility], [phone]  [address], [place], [place]   SW telephoned and spoke with staff. They reported that they have a long waiting list.    [facility], [phone]  SW telephoned and spoke with staff. They said that pt will have to get there by 6:30pm to get in line and can do it nightly until the end of  They reported that this shelter can help with referrals to services, onsite housing search enrollments and other supportive services as available. Shelter has 40 mats, 2 meals (dinner and takeaway breakfast), coffee, snacks, bathroom/showers, laundry, outdoor smoking area.    12/24/21: [facility] in [place], [phone]  SW telephoned. Left VM. Provided call back number.  ?  Barriers to Discharge:  -Suicidal ideation  -Homeless  ?  Collaterals:  -[place],?Cora Collum, [facility],?Tel: [phone],?Fax: [phone]  [facility],?[place],?[phone]  ??  Treatment Plan:  -Meet with patient daily to assess mood and monitor progress.  -Speak with collaterals to gain collateral information.  -Encourage patient to engage in the groups while in the unit.  -Evaluate living situation and support system.  -Coordinate treatment and discharge planning with the outpatient team.?  ?  Etta Grandchild, LCSW

## 2021-12-27 NOTE — Group Note (Signed)
OT Psych Group Documentation    Topics: Breakfast Bites      Occupations Addressed: Activities of Daily living, Social participation, Instrumental activities of daily living (health management & maintenance)     Skills addressed: Fine/gross motor skills, process skills, social interaction skills, behavioral control, mental functions (cognitive, perceptual, affective)         Date of group: 12/27/2021  Start Time: 0800  End Time: 0845    Attendance: Attended entire group  Participation/Patient Response: Contributed to discussion, Intrusive/disruptive behavior and Responsive to cues/limits/redirection from staff  Participation within a task: Disorganzied task skills, Needed assistance with decision making, Needed assistance with problem solving and Responsive to cues/limits/redirection from staff  Treatment Goals Addressed: Development/maintenance of a health lifestyle and Diagnosis/illness/symptom management    Comments: Agitated and disruptive, arguing over tray / seat. Difficulty with patience waiting for coffee during an appropriate wait time of less than a minute. Singing along to music louder than socially acceptable, poor social boundaries. Responsive to redirection.     Darcel Bayley, OT [phone]

## 2021-12-28 MED ORDER — CITRATE OF MAGNESIA PO SOLN
148.0000 mL | Freq: Once | ORAL | Status: DC
Start: 2021-12-28 — End: 2021-12-28

## 2021-12-28 MED ORDER — POLYETHYLENE GLYCOL 3350 17 G PO PACK
17.0000 g | PACK | Freq: Every day | ORAL | Status: DC | PRN
Start: 2021-12-28 — End: 2022-01-06
  Administered 2021-12-31 – 2022-01-03 (×2): 17 g via ORAL
  Filled 2021-12-28 (×3): qty 1

## 2021-12-28 MED ORDER — NICOTINE POLACRILEX 2 MG MT GUM
4.00 mg | CHEWING_GUM | OROMUCOSAL | Status: AC | PRN
Start: 2021-12-28 — End: 2022-01-04
  Administered 2021-12-28 – 2022-01-04 (×23): 4 mg via ORAL
  Filled 2021-12-28 (×25): qty 2

## 2021-12-28 NOTE — Group Note (Signed)
OT Psych Group Documentation    Topics: Breakfast Bites      Occupations Addressed: Activities of Daily living, Social participation, Instrumental activities of daily living (health management & maintenance)     Skills addressed: Fine/gross motor skills, process skills, social interaction skills, behavioral control, mental functions (cognitive, perceptual, affective) mental functions         Date of group: 12/28/2021  Start Time: 0800  End Time: 0845    Attendance: Declined    [place] [id number]
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
## 2021-12-28 NOTE — Plan of Care (Signed)
Patient was recieved in the dining room at start of shift, dozing off and on and C/O of nausea. She was assessed and walked back to her room with a rolling walker. pt recieved PRN zofran 4 mg PO with good effect. She also had PRN nicorette gum and MOM per her request with pending result. Lat BM was on 12/26/21. Pt took medication after 2 attempts. She refused breakfast and ate 50% of lunch. Pt denied SI/HI/AH/VH. Will continue with the plan of care.

## 2021-12-28 NOTE — Group Note (Signed)
OT Psych Group Documentation    Topics: Project      Occupations Addressed: Leisure, Social participation     Skills addressed: Fine/gross motor skills, process skills, social interaction skills, behavioral control, mental functions                     Date of group: 12/28/2021  Start Time: 1100  End Time: 1145    Attendance: Declined      [place] [id number]

## 2021-12-28 NOTE — Group Note (Signed)
OT Psych Group Documentation    Topics: Feel Good      Occupations Addressed: Activities of daily living (functional mobility), Instrumental activities of daily living (health management & maintenance), Leisure, Social participation      Skills addressed: Fine/gross motor skills, social interaction skills, process skills, emotional regulation, mental functions         Date of group: 12/28/2021  Start Time: 1015  End Time: 1100    Attendance: Declined, reports not feeling well    Darcel Bayley, OT [phone]

## 2021-12-28 NOTE — Progress Notes (Addendum)
Intermittently visible in the milieu. She attended early evening groups. Alert, calm and cooperative. Continued on O2 at 3 L NC. No Respiratory distress, no SOB. Pt is currently on 5 minute safety checks. She ate supper in group, 100%. Took all medications as scheduled. She requested Nicorette Gum 4 mg for Nicotine Craving. C/O Constipation, Pt had Miralax PRN. She reported medium BMs. No SI/HI. No safety concerns. We will continue to monitor. BP 113/76   Pulse 90   Temp 97.3 ?F (36.3 ?C) (Temporal)   Resp 18   Ht 5\' 9"  (1.753 m)   Wt (!) 157.5 kg (347 lb 3.2 oz)   SpO2 93%   BMI 51.27 kg/m?

## 2021-12-28 NOTE — Progress Notes (Addendum)
Pt received sleeping in bed at the start of shift at the start of shift. Pt came out to use the hallway bathroom using walker at 12.30 am, sob on exertion, deep breathing encouraged o2sat 87 % RA, HR 102, with o2 @ 2L 02Sat went up to 95 % HR 98. Pt denies SI/HI/AH/VH. Pt came back out and sat in the hallway socializing with room mate, c/o insomnia Prn Mel[place]on administered at 1.41 am with good effect. Last BM reported on 12/26/21. Am synthroid not given due to pt was asleep. Pt received synthroid at 7.02 am when she woke up. No behavior or safety concerns. Will continue to monitor & maintain 5 minutes safety checks. Slept 6.7 hrs

## 2021-12-29 MED ORDER — CITRATE OF MAGNESIA PO SOLN
296.0000 mL | Freq: Once | ORAL | Status: DC
Start: 2021-12-29 — End: 2021-12-29
  Filled 2021-12-29: qty 296

## 2021-12-29 MED ORDER — BISACODYL 10 MG PR SUPP
10.00 mg | Freq: Once | RECTAL | Status: AC
Start: 2021-12-29 — End: 2021-12-29
  Administered 2021-12-29: 10 mg via RECTAL
  Filled 2021-12-29: qty 1

## 2021-12-29 MED ORDER — AZITHROMYCIN 250 MG PO TABS
750.0000 mg | ORAL_TABLET | ORAL | Status: DC
Start: 2021-12-30 — End: 2021-12-29

## 2021-12-29 MED ORDER — AZITHROMYCIN 250 MG PO TABS
500.0000 mg | ORAL_TABLET | ORAL | Status: DC
Start: 2021-12-31 — End: 2022-01-06
  Administered 2021-12-31 – 2022-01-05 (×3): 500 mg via ORAL
  Filled 2021-12-29 (×3): qty 2

## 2021-12-29 MED ORDER — NALOXEGOL OXALATE 12.5 MG PO TABS
25.0000 mg | ORAL_TABLET | Freq: Every day | ORAL | Status: DC
Start: 2021-12-29 — End: 2021-12-29

## 2021-12-29 NOTE — Progress Notes (Signed)
Pt received sleeping in bed at the start of shift at the start of shift. Pt came out to use the hallway bathroom x 2, with assist, non compliant with using o2 outside room despite educating pt, 02sat 87% RA, sob with ambulation, deep breathing encouraged. Continue on o2 @ 3L 02Sat  91 % HR 93. Pt's sleep was intrupted by another pt screaming, easily re-directed. Pt denies SI/HI/AH/VH. Last BM reported on 12/26/21. Compliant with 6 am medication and using 02 outside room. No behavior or safety concerns. Will continue to monitor & maintain 5 minutes safety checks. Slept 5.8 hrs

## 2021-12-29 NOTE — Progress Notes (Addendum)
Received in the Quiet Group Room watching TV w/ selected peers. Alert and anxious. Pt had requested Ativan PRN which was given w/ good effects. Continued on O2 at 2 L NC, but Pt is noncompliant w/ it. No Respiratory distress, no SOB. Pt is currently on 5 minute safety checks. She ate supper in group, 100%. Took all medications as scheduled. She also requested Nicorette Gum 4 mg for Nicotine Craving. Had Dulcolax Suppository w/ good effects. Had a shower. No SI/HI. No safety concerns. We will continue to monitor. BP 117/80   Pulse 102   Temp 96 ?F (35.6 ?C) (Temporal)   Resp 18   Ht 5\' 9"  (1.753 m)   Wt (!) 157.5 kg (347 lb 3.2 oz)   SpO2 92%   BMI 51.27 kg/m?

## 2021-12-29 NOTE — Group Note (Signed)
OT Psych Group Documentation    Topics: Breakfast Bites      Occupations Addressed: Activities of Daily living, Social participation, Instrumental activities of daily living (health management & maintenance)     Skills addressed: Fine/gross motor skills, process skills, social interaction skills, behavioral control, mental functions (cognitive, perceptual, affective)         Date of group: 12/29/2021  Start Time: 0800  End Time: 0845    Attendance: Attended entire group  Participation/Patient Response: Contributed to discussion, Intrusive/disruptive behavior and Responsive to cues/limits/redirection from staff  Participation within a task: Disorganzied task skills, Needed assistance with decision making, Needed assistance with problem solving and Responsive to cues/limits/redirection from staff  Treatment Goals Addressed: Development/maintenance of a health lifestyle and Diagnosis/illness/symptom management  ?  Comments: Agitated and disruptive. Difficulty with patience waiting for coffee during an appropriate wait time of less than a minute. Responsive to redirection. Attentive to meal. Declined menu selection at this time.    Darcel Bayley, OT [phone]

## 2021-12-29 NOTE — Plan of Care (Addendum)
Problem: Mood-related symptoms  Description: Pt. Is a 51 year old female with chronic mental illness who recently left group home and now homeless. PMH: obesity, COPD, CHF, spinal stenosis. Diagnosis MDD with SI. Pt. Reports no SI on admission.   Goal: Reduction in Intensity and Frequency (Mood)  Description: Pt. Will seek out staff if having any Suicidal ideation on day one of admission. Pt. Will be medication adherent on day one of admission. Pt. Will attend scheduled groups daily on day two of admission.  Outcome: Progressing   Patient had  Ne items: one shirt , one blouse, six underpants, I sleep wear,1 shampooo and 1conditioner brought by  Education officer, museum. Items stored in the storage room.

## 2021-12-29 NOTE — Progress Notes (Signed)
PSYCH PROGRESS NOTE    Identifying Data:  Cheryl Zimmerman is a 51 year old English-speaking woman with longstanding psych history, including chart diagnoses of PTSD, SCAD, BPAD, and PSUD,multiple hospitalizations starting in childhood, onantipsychotic medication since age 54,multiple prior suicide attempts and SIB requiring medical attention,severe head trauma and seizure d/o, and complex medical illness, including in the ED where she attempted to strangle herself with EKG wires, (04/2021),  PMH notable for chronic systolic congestive heart failure, seizure disorder,on Keppra,pain syndrome, COPD, Bronchial asthma, morbid obesity, who until recently lived in a group home, but left voluntarilyadmittedwith suicidal ideation with a plan to jump in front of the train or overdose on pills that was transferred from [name] because of complaints of suicidality..      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  From the nursing report: In the morning the patient  complained of nausea and did not eat breakfast. After receiving zofran felt better and asked for a snack. No BM for several days, received MOM and colace with results pending. Lats BM on 12/26/21. Compliant with medications , in good behavioral control, no safety issues. In pm participated in 1 group, was social and pleasant, on 5 minutes checks. Had Miralax without result, still complaining of constipation. During the night on O2 3l, sat. 91%, slept until interrupted by another patient screaming, easily redirected and fell back asleep. Patient is noncompliant with O2 while awake and tends to keeps it off and at times desats to 87.  The patient was seen during morning rounds together with the social worker. She reported feeling well, being in a good mood and denies suicidal and homicidal thoughts and hallucinations. We discussed the necessity to be on constant oxygen but the patient is resistant and claims to not have used oxygen constantly outside the unit, claims that she discussed it with her PCP. The SW and the patient ordered some clothing and hygiene items for the patient. The patient spoke about receiving her clothing from the groups home where she has been residing.The staff there reported that the patient every month tries to avoid paying for the group home and leaves to go to the hospital and claims feeling suicidal to be admitted and later accuses the group home or the hospital that they stole her belongings.  At present the patient is adamant about not returning to the group home and the SW keeps receiving rejections from STR and medical group homes for different reasons.     Allergies:  Review of Patient's Allergies indicates:   Bee venom               Anaphylaxis   Carbamazepine           Hives   Methylprednisolone      Dizziness, Drowsiness    Comment:Also believes syncope. Seems to tolerate             inhalational and epidural steroid.   Nutritional supplem*       Hydroxyzine             Nausea  Only    Scheduled Meds:  Current Facility-Administered Medications   Medication Dose Route Frequency Last Admin    clotrimazole (LOTRIMIN AF) 1 % cream   Topical BID Given at 12/29/21 0812    melatonin tablet 3 mg  3 mg Oral Nightly 3 mg at 12/28/21 2059    QUEtiapine (SEROquel) tablet 50 mg  50 mg Oral Nightly 50 mg at 12/28/21 2049    benzonatate (TESSALON) capsule 100 mg  100 mg Oral TID 100 mg at 12/29/21 0809    metolazone (ZAROXOLYN) tablet 5 mg  5 mg Oral Daily 5 mg at 12/29/21 0809    torsemide (DEMADEX) tablet 120 mg  120 mg Oral TID 120 mg at 12/29/21 0810    atorvastatin (LIPITOR) tablet 80 mg  80 mg Oral Daily 80 mg at 12/29/21 0809    budesonide-formoterol (SYMBICORT) 160-4.5 MCG/ACT inhaler 2 puff  2 puff Inhalation 2 times daily 2 puff at 12/29/21 0810    buprenorphine-naloxone (SUBOXONE) 8-2 MG SL tablet 1 tablet  8 mg Sublingual TID 1 tablet at 12/29/21 0810    gabapentin (NEURONTIN) capsule 600 mg  600 mg Oral TID 600 mg at 12/29/21 0810    levETIRAcetam (KEPPRA) tablet 750 mg  750 mg Oral BID 750 mg at 12/29/21 0810    levothyroxine (SYNTHROID) tablet 25 mcg  25 mcg Oral DAILY 25 mcg at 12/29/21 0630    loratadine (CLARITIN) tablet 10 mg  10 mg Oral Daily 10 mg at 12/29/21 0810    loxapine (LOXITANE) capsule 25 mg  25 mg Oral BID 25 mg at 12/29/21 0809    melatonin tablet 3 mg  3 mg Oral Nightly 3 mg at 12/28/21 2049    methocarbamol (ROBAXIN) tablet 750 mg  750 mg Oral TID 750 mg at 12/29/21 0810    naproxen (NAPROSYN) tablet 500 mg  500 mg Oral BID WC 500 mg at 12/29/21 0810    nicotine (NICODERM CQ) 21 MG/24HR 1 patch  1 patch Transdermal Daily 1 patch at 12/29/21 0826    OXcarbazepine (TRILEPTAL) tablet 150 mg  150 mg Oral BID 150 mg at 12/29/21 0810    pantoprazole (PROTONIX) EC tablet 40 mg  40 mg Oral Daily 40 mg at 12/29/21 0810    prazosin (MINIPRESS) capsule 3 mg  3 mg Oral Nightly 3 mg at 12/28/21 2049    sennosides (SENOKOT) tablet 8.6 mg  8.6 mg Oral Daily 8.6 mg  at 12/29/21 0810    thiamine (VITAMIN B-1) tablet 100 mg  100 mg Oral Daily 100 mg at 12/29/21 0810    aspirin chewable tablet 81 mg  81 mg Oral Daily 81 mg at 12/29/21 0810    FLUoxetine (PROzac) capsule 20 mg  20 mg Oral Daily 20 mg at 12/29/21 0810    lidocaine (SALONPAS) 4 % patch 1 patch  1 patch Topical Q24H 1 patch at 12/29/21 0811         PRN Meds:  Current Facility-Administered Medications   Medication Dose Route Frequency Last Admin    polyethylene glycol  17 g Oral Daily PRN      nicotine polacrilex  4 mg Oral Q2H PRN 4 mg at 12/29/21 8469    LORazepam  0.5 mg Oral BID PRN 0.5 mg at 12/29/21 0826    melatonin  3 mg Oral QHS PRN, MR X 1 3 mg at 12/28/21 0141    benzocaine-menthol  1 lozenge Buccal Q2H PRN      nystatin   Topical BID PRN Given at 12/24/21 1645    acetaminophen  650 mg Oral Q6H PRN 650 mg at 12/25/21 1106    albuterol HFA  2 puff Inhalation Q6H PRN      diclofenac  2 g Topical TID PRN      docusate sodium  100 mg Oral Q12H PRN 100 mg at 12/25/21 1356    ondansetron  4 mg Oral Q8H PRN 4 mg at 12/28/21 0945    magnesium hydroxide  30 mL Oral Daily PRN 30 mL at 12/28/21 0946    aluminum-magnesium hydroxide-simethicone  30 mL Oral Q2H PRN 30 mL at 12/25/21 1106         VITALS:  Patient Vitals for the past 24 hrs:   BP Temp Temp src Pulse Resp SpO2   12/29/21 1017 -- -- -- -- -- 93 %   12/29/21 0810 -- -- -- -- 18 --   12/29/21 0809 123/87 -- -- -- -- --   12/29/21 0727 123/84 98.5 F (36.9 C) Oral 86 18 92 %   12/29/21 0223 -- -- -- 93 -- 91 %   12/28/21 2050 -- -- -- -- 18 --   12/28/21 2049 113/76 -- -- -- -- --   12/28/21 1539 107/72 97.3 F (36.3 C) TEMPORAL 90 18 93 %   12/28/21 1344 -- -- -- -- 16 --       MENTAL STATUS EXAM:   Mental Status Exam   General Appearance: Dressed in hospital gown/johnny  Behavior: Cooperative  Level of Consciousness: Alert  Orientation Level: Oriented x3  Attention/Concentration: WNL  Mannerisms/Movements: Abnormal gait  Speech Quality and  Rate: WNL  Speech Clarity: Clear  Speech Tone: Normal vocal inflection  Vocabulary/Fund of Knowledge: WNL  Memory: Intact  Thought Process & Associations: Logical  Dissociative Symptoms: None  Thought Content: No abnormalities reported or observed  Delusions: None  Hallucinations: None  Suicidal Thoughts: None  Homicidal Thoughts: None  Mood: Anxious  Affect: Congruent with mood  Judgment: Fair  Insight: Fair    C-SSRS:  Default Flowsheet Data (most recent)     C-SSRS Frequent Screener - 12/24/21 0005        C-SSRS Frequent Screener    Since last contact, have you actually had thoughts about killing yourself ?  Yes     Have you been thinking about how you might do this?  No     Have you had these thoughts and had some intention of acting on them?  No     Have you started to work out or worked out the details of how to kill yourself? Do you intend to carry out this plan? No     Have you done anything, started  to do anything, or prepared to do anything to end your life?  No     C-SSRS Frequent Screener Risk Score Low Risk                 Default Flowsheet Data (most recent)     C-SSRS Risk Assessment - 12/23/21 1344        C-SSRS Risk Assessment    Suicidal and Self-Injurious Behavior (Past 3 Months) Aborted or Self-Interrupted attempt     Suicidal and Self-Injurious Behavior (Lifetime) Actual suicide attempt;Self-injurious behavior without suicidal intent     Suicidal Ideation Check Most Severe in Past Month Wish to be dead     Activating Events (Recent) Current or pending isolation or feeling alone     Treatment History Hopeless or dissatisfied with treatment;Non-compliant with treatment     Protective Factors (Recent) Identifies reasons for living;Supportive social network or family;Belief that suicide is immoral, high spirituality;Responsibility to family or others, living with family;Fear of death or dying due to pain and suffering     Any suicidal, self-injurious, or aggressive behaviors?  Yes     Describe any  suicidal, self-injurious or aggressive behavior (INCLUDE DATES) cutting behavior multiple episodes, last time during teh last month                 Physical/Somatic Complaints  The patient lists: pain, shortness of breath, unsteady gait.          DSM-V    Primary Diagnosis: Major depressive disorder, recurrent episode, severe   ([name])   Secondary Diagnosis: Borderline personality disorder ([name])   General medical conditions: Seizure Disorder, Hyperlipidemia, Diabetes   Mellitus, Type II, Coronary Artery Disease, Chronic Pain, Chronic   Obstructive Pulmonary Disease  Psychosocial and Contextual Factors: Housing problems, Problems related to   social environment, Problems with access to health care services, Problems   with primary support group           Assessment  Cheryl Zimmerman is a 51 year old English-speaking woman with longstanding psych history, including chart diagnoses of PTSD, SCAD, BPAD, and PSUD,multiple hospitalizations starting in childhood, onantipsychotic medication since age 16,multiple prior suicide attempts and SIB requiring medical attention,severe head trauma and seizure d/o, and complex medical illness, who lives in a group home,presenting to ED with increasing suicidal ideation,hallucinations,and trauma reexperiencing symptoms initially admitted to medicine because of hypoxia and transferred to geriatric psychiatry because of ongoing suicidality.     Diagnostic uncertainty given overlapinchart diagnoses. Patient identifies her most concerning symptoms are those associated with PTSD,namely nightmares, intrusive memories.Visual hallucinations may be decompensation of SCAD,trauma-related(e.g. flashbacks),major depressive episode with psychotic features, or transient psychosis in the setting of stress as seen in some personality disorders. Memory deficits on exam may be due to severe history of head injury, polypharmacy and substance use,or vitamin deficiency.    In terms of risk,  patient endorsed SI with intent and method(states she will jump in front of a train at the nearest station as soon as she is discharged) on admissionand believed that ending her life will reunite her and have been with her deceased son. She currently denies those thoughts. Her trauma-reexperiencing and psychotic symptoms related to her son are heightening her risk of suicide. Additional risk factors include history of multiple attempts, recent SIB requiring medical attention, history of head trauma, comorbid substance use, preparatory behaviors/attempt in ED,impulsivity,perceived lack of social support, and chronic pain.There are a few mitigating risk factors and patient at  is  agreeable to safety  planning. She feels much better ad is ready to discuss dc options and placement.     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 prozac 20 mg po daily    Anxiety:  Cont. Lorazepam 1 mg po BID    #Dyspnea on exertion  #Peripheral edema  #HFpEF (EF 70%, no WMA on TTE 12/07/21)  #OHS, OSA  #COPD/asthma  -feels dyspnea, peripheral edema worse since recent hospital discharge, missing about ~ 1d of medication  -possibly w/ mild CHF exacerbation from missed diuretic doses  -no e/o infection or copd exacerbation  -increase torsemide to 120 bid (similar to recent admission, requiring an increased dose briefly) (day team to follow-up daily to adjust dose prn. Had been discharged on torsemide 100 bid)  -I/Os, daily weights, vitals  -SpO2 goal 88-92, if SpO2 >90, does not need supplemental O2 (may need it only overnight 1-2L)  -admitted on ~2 L supplemental O2 w/ SpO2 >92 (baseline is closer to ~90 on chart review); on review of current presentation, lowest O2 on flow sheet was 91 on room air  -c/w ICS/LABA, SABA prn  -f/u nt-pro-bnp and repeat basic labs    #burning sensation in lungs/right back  -suspect due to recent cocaine use (onset after hospital d/c, cocaine use)  -no concerning  skin changes on back  -no concerning urinary sxs, f/u ua  -trial of gi cocktail and lidocaine patch    #complex chronic pain syndrome  #lumbar spinal stenosis w/ sciatica  #L knee pain, improved  #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  -had mechanical fall while on recent psych admission resulting in increased L knee pain and difficulty ambulating, requiring transfer to medicine  -evaluated by ortho who felt MRI L knee findings appeared chronic, seen by PT who recommended skilled PT and STR  -c/w tylenol, naproxen, suboxone, gabapentin, robaxin    #PSUD  -recovery coach consult  -uds + for marijuana, cocaine, suboxone  -c/w suboxone tid (takes also for chronic pain)  -cessation, NRT    #Seizure d/o  -c/w keppra, trileptal    #HLD  -c/w statin    #h/o HCV  -recent viral load undetectable, cleared on her own      #housing instability  -social work consult    Diet-cardiac  15 minutes checks  DVT prophylaxis-sch  Med rec- completew/ recent d/c med list  PCP-unclear; possiblyJean Maisie Fus, FNP  HCP-not on file  Code- full       Review with patient: Treatment plan reviewed with the patient.

## 2021-12-29 NOTE — Progress Notes (Addendum)
Progress Note:  Pt was reviewed in clinical meeting this morning. SW and?Dr. Jaymes Graff pt in the conference room. Pt appeared alert, calm, and cooperative. Denies SI/HI. Pt reported being in good spirits. Pt was assisted with getting her debit card and her phone to order clothes, shoes, and shampoo/conditioner. Pt reported understanding that she has to return the shampoo and conditioner after she finishes using it as it is not allowed to be kept in her room. Pt got declined from [place], [place] at [facility]. They informed SW that pt is not clinically appropriate for their facilities. SW emailed [facility] clinical liaison and checked on referral status. SW resubmitted referral to [facility] as admissions director was not able to see it on Epic.     Cheryl Zimmerman - MS, [place], [name], [facility] ([place]) Team Leader - [place] ACCS Team 2, [email or web address]  [address], [place], [place] [address] [place] received email from Team Leader:  "...there is a consistent pattern of elopement, leaving AMA, along with requesting clothing just to say it was stolen upon discharge. She currently is homeless so with those concerns I will need to check-in if it?s appropriate for Cheryl Zimmerman to support with this at this time. I will let you know, but I am erring on the side of caution!... I got in touch w/ my RD. We can provide some of her clothing, it is currently packed up at [facility]. She is no longer a client of [facility], so that is not an option for her to return anyway. I am not sure how much you know of her, and we can chat more, but there is a long-standing hx w/ her declining supports, and recommendations. She overstayed Respite close to 39mo refusing to leave until we were able to section her. She is on the Respite list, but that is prioritized and a hefty waitlist, so if she leaves, it would be a shelter, or her eloping somewhere unsafe. At this time, it is ACCS recommendation for a Rest Home, as we do not feel  she is appropriate for any other community-living given her constant medical needs, falling, and now your reported concerns for her Oxygen level. [facility] by ETedra Zimmerman and she refused the care while there..."    Rest Homes:  [facility] [phone]([place]  SW telephoned and inquired about rest home for pt. SW JFara Oldenreported that they are not able to accommodate pt's oxygen needs.    [facility] ([phone] SW telephoned and inquired about bed availability. Staff reported that there is currently a waitlist. SW provided demographic information, psychiatric diagnosis, and oxygen need. Staff reported they will give this information to their administration then get back to SW.    Medical Respite Referrals:  [facility] SW telephoned and spoke with intake office. Staff reported that pt is psychiatrically higher level of care than what they can provide there. Also, they cannot accommodate pt's oxygen needs.    [place] [place] SW telephoned and inquired about referral. Staff reported that they did not receive it. SW sent referral again today for review.  _________________________________________________________    12/27/21:SW made STR referrals to [facility] contracted facilities, including [facility], [name], [facility], [facility], [facility]  and rehab facility. Thus far [facility] and [name] have declined pt. [facility] SNF admissions director reported pt is not clinically appropriate due to?needing?a bariatric bed. She said?in the notes it states pt?needs a bariatric wheelchair as well. [facility] SNF admissions director reported she?believes?hospital beds are bigger then the nursing beds they?have. [name] admissions director reported pt is not clinically  appropriate for their facility due to pt's?prior SI and?feel pt?would need a?secure floor.  ??  Barriers to Discharge:  -Suicidal ideation  -Homeless  ?  Collaterals:  -[place],?Cora Collum, [place] Dis[facility],?Tel: [phone],?Fax: [phone]  Cheryl Zimmerman ACCS CM,?[place],?[phone]   ??  Treatment Plan:  -Meet with patient daily to assess mood and monitor progress.  -Speak with collaterals to gain collateral information.  -Encourage patient to engage in the groups while in the unit.  -Evaluate living situation and support system.  -Coordinate treatment and discharge planning with the outpatient team.?  ?  Etta Grandchild, LCSW

## 2021-12-29 NOTE — Group Note (Signed)
OT Psych Group Documentation    Topics: Feel Good      Occupations Addressed: Activities of daily living (functional mobility), Instrumental activities of daily living (health management & maintenance), Leisure, Social participation      Skills addressed: Fine/gross motor skills, social interaction skills, process skills, emotional regulation, mental functions           Date of group: 12/29/2021  Start Time: 1015  End Time: 1100    Attendance: [place] [id number]

## 2021-12-29 NOTE — Group Note (Signed)
OT Psych Group Documentation    Topics: Project      Occupations Addressed: Leisure, Social participation     Skills addressed: Fine/gross motor skills, process skills, social interaction skills, behavioral control, mental functions                     Date of group: 12/29/2021  Start Time: 1100  End Time: 1145    Attendance: Declined      [place] [id number]

## 2021-12-29 NOTE — Progress Notes (Signed)
Progress Note:  Pt was reviewed in clinical meeting this morning. SW and?Dr. Jaymes Graff pt in her room. Pt was sitting on her bed. Pt appeared alert, calm, and cooperative. Discussed about helping pt use her credit card/debit card to buy clothes and send to the hospital. Pt has been back and forth about wanting to discharge from the hospital. Pt reported she will not go back to [facility] group home and would prefer a different living placement. [facility] reported that they cannot accept pt due to history of SI. [facility] STR reported they does not have a female bed available. SW made referrals to medical respites, including [facility] in [place], [place] and Elie Confer [facility] in [place], [place].    [facility] in [place], Program Manager, [place], [phone]  SW telephoned and spoke with Lavella Lemons. She reported that pt has a history of getting hospitalized and then asking for clothes to be sent to pt. Once clothes are sent, pt would go back to [facility] group home and report that the hospital stole pt's clothes. Therefore, pt would be told that nothing is there anymore. She reported that clothes are still there at this time but SW would have to connect with another staff member to see if it is possible to send clothes to [facility]. She said this is a regular occurrence and do not know if clothes will be sent to pt again. SW Dietitian at [email or web address].  ?  12/27/21:SW made STR referrals to [facility] contracted facilities, including [facility], [name], [facility], [facility], [facility], [address], and [facility] [employer] [facility] and rehab facility. Thus far [facility] and [name] have declined pt. [facility] SNF admissions director reported pt is not clinically appropriate due to needing a bariatric bed. She said in the notes it states pt needs a bariatric wheelchair as well. [facility] SN[facility] admissions  director reported she believes hospital beds are bigger then the nursing beds they have. [name] admissions director reported pt is not clinically appropriate for their facility due to pt's prior SI and feel pt would need a secure floor.  ??  Barriers to Discharge:  -Suicidal ideation  -Homeless  ?  Collaterals:  -[place],?[address], [place] Discharge [facility],?Tel: [phone],?Fax: [phone]  [phone] ACCS CM,?[place],?[phone]  ??  Treatment Plan:  -Meet with patient daily to assess mood and monitor progress.  -Speak with collaterals to gain collateral information.  -Encourage patient to engage in the groups while in the unit.  -Evaluate living situation and support system.  -Coordinate treatment and discharge planning with the outpatient team.?  ?  Tedra Coupe, LCSW

## 2021-12-29 NOTE — Progress Notes (Addendum)
Patient has been visible in the milieu, anxious at times, cooperative. Took her medications as ordered including prn nicorette gums x2, prn ativan for anxiety with good effect.  Attended morning group. Patient continues on O2 at 3L via NC, non compliant with oxygen, stated " I don't have to use the oxygen constantly, sat 93% on RA, no sob reported, stated feeling well. Clothing received from [place] and RN charge checked them with patient. No significant behavioral issues. C/o constipation, stated that Miralax and MOM are not working, MD and NP notified. Movantik ordered, order not verified by pharm.  BP 123/87   Pulse 86   Temp 98.5 ?F (36.9 ?C) (Oral)   Resp 18   Ht 5\' 9"  (1.753 m)   Wt (!) 157.5 kg (347 lb 3.2 oz)   SpO2 93%   BMI 51.27 kg/m?

## 2021-12-30 ENCOUNTER — Inpatient Hospital Stay (HOSPITAL_BASED_OUTPATIENT_CLINIC_OR_DEPARTMENT_OTHER): Payer: No Typology Code available for payment source

## 2021-12-30 ENCOUNTER — Inpatient Hospital Stay (HOSPITAL_BASED_OUTPATIENT_CLINIC_OR_DEPARTMENT_OTHER): Payer: No Typology Code available for payment source | Admitting: Internal Medicine

## 2021-12-30 DIAGNOSIS — R911 Solitary pulmonary nodule: Secondary | ICD-10-CM

## 2021-12-30 DIAGNOSIS — J441 Chronic obstructive pulmonary disease with (acute) exacerbation: Secondary | ICD-10-CM

## 2021-12-30 NOTE — Group Note (Signed)
OT Psych Group Documentation     Topics: Feel Good      Occupations Addressed: Activities of daily living (functional mobility), Instrumental activities of daily living (health management & maintenance), Leisure, Social participation      Skills addressed: Fine/gross motor skills, social interaction skills, process skills, emotional regulation, mental functions           Date of group: 12/30/2021  Start Time: 1015  End Time: 1100    Attendance: Decl[place] [id number]

## 2021-12-30 NOTE — Progress Notes (Addendum)
Pt received sleeping in bed at the start of shift at the start of shift. slept in short naps and remain in bed. Continue on 3 l 02 denies sob or resiratory distress. 02 sat 95%. Am synthrouid not given d/t pt sleeping. Last BM reported on 12/29/21. No behavior or safety concerns. Will continue to monitor & maintain 15 minutes safety checks. Slept 3.8 hrs

## 2021-12-30 NOTE — Progress Notes (Addendum)
Patient was visible sitting in the group room in the morning watching TV, sleeping on and off, complaints of not sleeping last night. Anxious, tearful in the afternoon, requested and received ativan with good effect. Took  medications as ordered including prn nicorette gums x2. She ate 100% of both meals. Declined groups and alternatives to group attendance. Patient continues on O2 at 3L via NC, O2 sat 93%, no SOB reported. No significant behavioral issues, denies SI/HI/AH/VH. Ambulated with RW, assisted to bathroom, last BM yesterday. Currently in bed sleeping.  N.O CT Chest WO contrast.  BP 122/77   Pulse 92   Temp 97 ?F (36.1 ?C)   Resp 18   Ht 5\' 9"  (1.753 m)   Wt (!) 157.5 kg (347 lb 3.2 oz)   SpO2 93%   BMI 51.27 kg/m?

## 2021-12-30 NOTE — Group Note (Signed)
OT Psych Group Documentation    Topics: Breakfast Bites      Occupations Addressed: Activities of Daily living, Social participation, Instrumental activities of daily living (health management & maintenance)     Skills addressed: Fine/gross motor skills, process skills, social interaction skills, behavioral control, mental functions (cognitive, perceptual, affective)                 Date of group: 12/30/2021  Start Time: 0800  End Time: 0845    Attendance: Declined, ate quietly in tv room isolated.    Darcel Bayley, OT [phone]
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
## 2021-12-30 NOTE — Group Note (Signed)
OT Psych Group Documentation    Topics: Project      Occupations Addressed: Leisure, Social participation     Skills addressed: Fine/gross motor skills, process skills, social interaction skills, behavioral control, mental functions                     Date of group: 12/30/2021  Start Time: 1100  End Time: 1145    Attendance: Declined      [place] [id number]

## 2021-12-30 NOTE — Progress Notes (Addendum)
Patient was visible sitting in the group room watching TV. Ambulated independently without the walker stated " I don't need it".Took  medications as ordered including prn nicorette gums. Requested and received PRN ativan for anxiety with good effect. She ate 100% of her dinner. Attended dinner group. Patient continues on O2 at 3L via NC, but is non compliant, stated " I don't need it" O2 sat 93% on RA, no SOB reported. No  behavioral issues, denies SI/HI/AH/VH. last BM yesterday. CT chest done result pending. Currently in group room calm watching TV.  .BP 148/89   Pulse 95   Temp 98 ?F (36.7 ?C) (Oral)   Resp 18   Ht 5\' 9"  (1.753 m)   Wt (!) 157.5 kg (347 lb 3.2 oz)   SpO2 93%   BMI 51.27 kg/m?

## 2021-12-30 NOTE — Progress Notes (Addendum)
Progress Note:  Pt was reviewed in clinical meeting this morning. SW and?Dr. Jaymes Graff pt in the common TV room. Pt was sitting on the chair with her O2 on. Pt appeared alert and asking to be discharged. Pt was anxious and tearful at times during interview. Pt reported that she will work on getting her birth certificate after she discharges in order to get another government issued ID. She stated that her plan is to attend a shelter in [place] where she can get meals and have a place to sleep. Denies SI/HI.     [name] - MS, [place], [name], [facility] ([place]) Team Leader - NS ACCS Team 2, [email or web address] [place], [place] [address] [address]  SW and Dr. Arleta Creek telephoned and spoke with team leader of [facility] team 2. She informed treatment team that pt eloped 11/15/21 from [place] [facility]. If there is no contact for 30 days, the bed is closed. Pt was officially discharged from the group home after she did not choose to return. Pt's bed at the group home has been already filled. Pt is currently on the [facility] respite list. Since [facility] is a voluntary service, pt can chooses to disengage services with them. Pt has eloped multiple times to avoid paying rent. Pt has declined many [place] services that have been offered to pt. Pt does not have a rep payee. Discussed the complications around finding an appropriate placement for pt where pt's needs can be met and where pt will be in agreement with.     Rest Homes:  12/30/21: [facility], [place], [phone]  Director was not in today. Staff asked SW to call back tomorrow.    4/13/23[facility], [place], [phone]  SW spoke with intake about pt. Intake staff reported she will e-mail SW an application and have pt fill it out and return it for review.     12/30/21: [place], [phone]  SW left VM. Inquired about rest home for pt.    12/30/21: [place], [phone]  SW attempted to reach someone. Phone kept ringing  out.    12/30/21:[place], [phone]  Cannot accommodate oxygen needs.    4/13/23Delos Haring, [phone]  No bed available at this time.    12/30/21: [address] [place], [phone]  Cannot accommodate oxygen needs.    12/30/21: [place], [place], [phone]  SW attempted to reach someone. Phone kept ringing out.    12/30/21: [facility], [place], [phone]  SW left message with staff. Inquired about rest home for pt.    12/30/21: [facility], [phone]  SW attempted to reach someone about making a referral but was unable to at this time. It did not allow SW to leave a VM.    12/30/21: [facility], [place], [place]  SW left VM for executive director. Inquired about rest home for pt.    12/30/21: [facility], [place] PTWSF,[phone]  SW left VM. Inquired about rest home for pt.    12/30/21: [place], [place], [phone]  Currently only have second floor available. Stairs have 15 steps. Unsure if they can accommodate pt's needs.    12/30/21: [place], [place], [phone]  SW attempted to reach someone. Phone kept ringing out.    12/30/21: [place], [phone]  SW telephoned. Staff reported they cannot accommodate pt's oxygen needs.    12/30/21: [facility], [place], [place]  SW attempted  to reach someone. No one answer. Not allowed to leave VM.    12/30/21: [facility], [place], [phone]  They cannot accommodate pt's oxygen needs.    12/30/21: [facility], [place], [phone]  No female bed available at this time.    4/13/23Stormy Fabian, [place], [phone]  SW telephoned. Provided fax number and direct number. Staff will fax application over for pt.    12/30/21: Mertha Finders, [phone]  SW emailed referral to [email or web address].    12/30/21: [facility], [employer], [phone]  They only accepted men there.    4/13/23Sonny Masters, [place], [phone]  No beds available at this time.    12/30/21: [address] [place], [phone]  SW left VM. Provided call back number.    12/30/21: [place], [place], [phone]  No beds available at this time.    12/30/21: [facility], [place], [phone]  No female beds at this time.    12/30/21: [place], [place], [phone]  Unable to take anyone on oxygen.    12/30/21: [place], [place], [phone]  SW provided SW's direct number for a call back.    12/29/21: [facility], [phone] (SW [place])  SW telephoned and inquired about rest home for pt. SW Fara Olden reported that they are not able to accommodate pt's oxygen needs.  ?  12/29/21: [facility], [phone]  SW telephoned and inquired about bed availability. Staff reported that there is currently a waitlist. SW provided demographic information, psychiatric diagnosis, and oxygen need. Staff reported they will give this information to their administration then get back to SW.     SNF:  12/30/21:  -[place]- no LTC/STR beds available.  -[place] director left for the day. Unable to leave VM.  -[place] LTC/STR beds available.  -[facility] [place], [place], t: [phone]- Sent referral.  -[place]- no female beds available.    12/29/21: Pt got declined from [facility], [place]. They informed SW that pt is not clinically appropriate for their facilities. SW emailed [facility] clinical liaison and checked on referral status. SW resubmitted referral to [facility] as admissions director was not able to see it on Epic.     12/27/21:SW made STR referrals to [facility] contracted facilities, including [place] [facility], [name], [place], [place] [facility], [facility], [facility], and [facility] and rehab facility. Thus far [place] and [name] have declined pt. [facility] SNF admissions director reported pt is not clinically appropriate due to?needing?a bariatric bed. She said?in the notes it states pt?needs a bariatric wheelchair as well. [facility] SNF admissions director reported she?believes?hospital beds are bigger then the nursing beds they?have. [name] admissions director reported pt is not clinically appropriate for their facility due to pt's?prior SI and?feel pt?would need a?secure floor.    Medical Respite Referrals:  12/29/21: [facility] [place], [phone]  SW telephoned and spoke with intake office. Staff reported that pt is psychiatrically higher level of care than what they can provide there. Also, they cannot accommodate pt's oxygen needs.  ?  12/30/21: [facility] [place], [place], [phone]  Staff reported pt is not appropriate for their medical respite. They are for pts pre or post medical procedures.  ??  Barriers to Discharge:  -Continuously  on oxygen  -Homeless  ?  Collaterals:  -[place],?Cora Collum, [place] [facility] [place],?Tel: [phone],?Fax: [phone]  Tedra Coupe ACCS CM,?[place],?[phone]   ??  Treatment Plan:  -Meet with patient daily to assess mood and monitor progress.  -Speak with collaterals to gain collateral information.  -Encourage patient to engage in the groups while in the unit.  -Evaluate living situation and support system.  -Coordinate treatment and discharge planning with the outpatient team.?  ?  Etta Grandchild, LCSW

## 2021-12-31 MED ORDER — FLUTICASONE PROPIONATE 50 MCG/ACT NA SUSP
1.0000 | Freq: Two times a day (BID) | NASAL | Status: DC | PRN
Start: 2021-12-31 — End: 2022-01-06
  Administered 2021-12-31 – 2022-01-06 (×3): 1 via NASAL
  Filled 2021-12-31: qty 16

## 2021-12-31 NOTE — Plan of Care (Signed)
Problem: Mood-related symptoms  Description: Pt. Is a 51 year old female with chronic mental illness who recently left group home and now homeless. PMH: obesity, COPD, CHF, spinal stenosis. Diagnosis MDD with SI. Pt. Reports no SI on admission.   Goal: Reduction in Intensity and Frequency (Mood)  Description: Pt. Will seek out staff if having any Suicidal ideation on day one of admission. Pt. Will be medication adherent on day one of admission. Pt. Will attend scheduled groups daily on day two of admission.  Outcome: Progressing   Received resting peacefully in bed. No COVID sx. No indications of pain/SI/HI/AH/VH. 15 minute safety checks. Continued refusal to wear 02. 90% RA. "I don't need it" in agitated tone. Refused to use walker from room to bathroom by Group Room stating "I don't need it." Poor safety awareness. No behavioral concerns.     *Sleep hours:  5.6  *LBM: 4/12  *Code status: Full  *Legal status: CV
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
## 2021-12-31 NOTE — Progress Notes (Signed)
Intermittently visible in the milieu. Alert and irritable. Pt was redirected as needed. No Respiratory distress, no SOB reported. Continued on O2 at 2 L NC. She ate supper at her bedside, 100%. Positive medications. Had Nicorette Gum 4 mg PRN for Nicotine Craving once this shift. Independent w/ ADLs. Last BMs 12/29/21. No SI/HI. No safety concerns. We will continue to monitor. BP 123/76   Pulse 81   Temp 96.9 ?F (36.1 ?C) (Temporal)   Resp 18   Ht 5\' 9"  (1.753 m)   Wt (!) 157.5 kg (347 lb 3.2 oz)   SpO2 92%   BMI 51.27 kg/m?

## 2021-12-31 NOTE — Progress Notes (Signed)
Pt reviewed in interdisciplinary rounds on 12/31/21. I saw her in person as well. She was ambulating slowly but independently in the hallway and looked significantly better than just 48 hours ago. I checked her ambulatory O2 sat and it was 89% on room air. She endorsed mild dyspnea and chest tightness but said she was feeling better and stronger overall. She expressed frustration over her weight and told me she lost 25 lbs in 2022 and gain it all back. She understands that weight loss would improve her exercise tolerance and reduce her chronic joint pain.     Plan: D/c oxygen, speak with PCP regarding starting a GLP1a at discharge.      SIGNED Claire Shown, CNP, 12/31/2021    Nurse Practitioner   Hospitalist Division  [facility]   Units [place] 2 and [place] 2     Phone: [phone] (fastest) or [phone]     Pager: [phone]    Email: [email or web address]

## 2021-12-31 NOTE — Group Note (Signed)
OT Psych Group Documentation    Topics: Breakfast Bites      Occupations Addressed: Activities of Daily living, Social participation, Instrumental activities of daily living (health management & maintenance)     Skills addressed: Fine/gross motor skills, process skills, social interaction skills, behavioral control, mental functions (cognitive, perceptual, affective)         Date of group: 12/31/2021  Start Time: 0800  End Time: 0845    Attendance: Attended entire group  Participation/Patient Response: Attentive/able to focus, Contributed to discussion, Interactive with others, Mood/affect congruent with topic, Observed/listened and Shared about self  Participation within a task: Demonstrated ability to focus, Demonstrated ability to make decisions, Demonstrated positive frustration tolerance, Open to assistance when offered, Responsive to cues/limits/redirection from staff and Social with peers  Treatment Goals Addressed: Development/maintenance of a health lifestyle and Diagnosis/illness/symptom management    Comments: Pleasant, engaging in appropriate conversation with peers, socializing while attending to meal. Able to complete menu with minA to orient.    Darcel Bayley, OT [phone]

## 2021-12-31 NOTE — Progress Notes (Signed)
But different aspects of life education and work [name] has a lot to say     PSYCH PROGRESS NOTE    Identifying Data:  Cheryl Zimmerman is a 51 year old English-speaking woman with longstanding psych history, including chart diagnoses of PTSD, SCAD, BPAD, and PSUD,multiple hospitalizations starting in childhood, onantipsychotic medication since age 8,multiple prior suicide attempts and SIB requiring medical attention,severe head trauma and seizure d/o, and complex medical illness, including in the ED where she attempted to strangle herself with EKG wires, (04/2021),  PMH notable for chronic systolic congestive heart failure, seizure disorder,on Keppra,pain syndrome, COPD, Bronchial asthma, morbid obesity, who until recently lived in a group home, but left voluntarilyadmittedwith suicidal ideation with a plan to jump in front of the train or overdose on pills that was transferred from [name] because of complaints of suicidality..      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  From the nursing report: The patient is off supplemented oxygen for almost 24 hours and has been saturating in the low 90s on room air, she has also been ambulating without a walker.  The patient is in good behavioral control, yesterday in the morning she was napping on and off because she has not slept well the previous night because of the roommate, but in the afternoon was more alert, participated in 1 group and was watching TV in the TV room.  Compliant with medications, complained of anxiety and received lorazepam with good effect.  Slept 5.6 hours.  Patient was seen during the morning rounds together with the social worker.  Presented sitting on a chair in the TV room, along, without oxygen, calm and smiling.  Easily engaged in conversation, has good ADLs, good eye contact.  Denied suicidal and homicidal thoughts.  The writer attempted to do a capacity evaluation and the patient eagerly participated.  She stated that she now knows that her COPD deteriorated and she had fluid in her lungs, but during her admission to the medical floor the physician increased the dose of her diuretics and she currently cannot breathe easier.  The patient stated she does not experience any lightheadedness or does not with more than usual, and she on purpose asking numerous to check her oxygenation without being on oxygen to make sure that  she can be without oxygen on the streets.  She was able to list all possible complications of being without oxygen and the symptoms of low oxygen that she would experience and stated that she will call for an ambulance after she sits down and rests if she continues to feel lightheaded and unsteady.  The patient said that she will buy pulseoxymeter and will check her oxygen level regularly.  She explained gratitude for the team's attempts to find housing options for her but stated that she has been in different settings, including group homes, rehabs and hospitals and  finds being in a shelter and on the street the best for her.  Stated that she has difficulty tolerating noise in the unit, the roommate who is constantly yelling and inability to sleep and would be much more preferred being in shelter setting like a warm shelter in [place] where she can live in the morning and go to [employer] and [employer] of her documents and living situations.  She is also hopeful that after living in a shelter for several months she would have a better chance of getting section 8.  The patient was able to fully reason the risks and benefits of being discharged to a shelter and agreed to the writer's suggestion of being off oxygen for several days and controlling her saturation to make sure that she will not need to be on oxygen consistently.  Meanwhile hospitalist and the primary care physician office working on getting an oxygen condensator and portable oxygen tank for the patient.  The Clinical research associate and the Child psychotherapist had a phone meeting with Mechele Collin case Set designer who explained extensive attempts to handle the patient needs by the agency and their frustration with the patient refusing their services.  She repeatedly stated that the patient has eloped from the previous group home on November 15, 2021 and after a month that she has not been living there they did not have any other choice but returned back to the state and now it is occupied by another patient and Azia is officially discharged from this home.  She stated that they attempted on multiple occasions different group homes but the patient always refused to pay her rent and used this money to buy drugs and every month left every group home and noted to be readmitted to the hospital to save some money.  The patient is currently on [facility] respite list but she also mentioned that it might be months until there is an opening.  She expressed a lot of frustration with the patient's behavior and her refusal to use available [facility] services  and the lack of other options for the patient.  Allergies:  Review of Patient's Allergies indicates:   Bee venom               Anaphylaxis   Carbamazepine           Hives   Methylprednisolone      Dizziness, Drowsiness    Comment:Also believes syncope. Seems to tolerate             inhalational and epidural steroid.   Nutritional supplem*       Hydroxyzine             Nausea Only    Scheduled Meds:  Current Facility-Administered Medications   Medication Dose Route Frequency Last Admin    azithromycin (ZITHROMAX) tablet 500 mg  500 mg Oral Once per day on Mon Wed Fri 500 mg at  12/31/21 0838    clotrimazole (LOTRIMIN AF) 1 % cream   Topical BID Given at 12/31/21 0847    melatonin tablet 3 mg  3 mg Oral Nightly 3 mg at 12/30/21 2000    QUEtiapine (SEROquel) tablet 50 mg  50 mg Oral Nightly 50 mg at 12/30/21 2046    benzonatate (TESSALON) capsule 100 mg  100 mg Oral TID 100 mg at 12/31/21 0839    metolazone (ZAROXOLYN) tablet 5 mg  5 mg Oral Daily 5 mg at 12/31/21 0840    torsemide (DEMADEX) tablet 120 mg  120 mg Oral TID 120 mg at 12/31/21 0839    atorvastatin (LIPITOR) tablet 80 mg  80 mg Oral Daily 80 mg at 12/31/21 0840    budesonide-formoterol (SYMBICORT) 160-4.5 MCG/ACT inhaler 2 puff  2 puff Inhalation 2 times daily 2 puff at 12/31/21 0841    buprenorphine-naloxone (SUBOXONE) 8-2 MG SL tablet 1 tablet  8 mg Sublingual TID 1 tablet at 12/31/21 0840    gabapentin (NEURONTIN) capsule 600 mg  600 mg Oral TID 600 mg at 12/31/21 0839    levETIRAcetam (KEPPRA) tablet 750 mg  750 mg Oral BID 750 mg at 12/31/21 0839    levothyroxine (SYNTHROID) tablet 25 mcg  25 mcg Oral DAILY 25 mcg at 12/31/21 0545    loratadine (CLARITIN) tablet 10 mg  10 mg Oral Daily 10 mg at 12/31/21 0839    loxapine (LOXITANE) capsule 25 mg  25 mg Oral BID 25 mg at 12/31/21 0838    melatonin tablet 3 mg  3 mg Oral Nightly 3 mg at 12/30/21 2000    methocarbamol (ROBAXIN) tablet 750 mg  750 mg Oral TID 750 mg at 12/31/21 0839     naproxen (NAPROSYN) tablet 500 mg  500 mg Oral BID WC 500 mg at 12/31/21 0839    nicotine (NICODERM CQ) 21 MG/24HR 1 patch  1 patch Transdermal Daily 1 patch at 12/31/21 0847    OXcarbazepine (TRILEPTAL) tablet 150 mg  150 mg Oral BID 150 mg at 12/31/21 0839    pantoprazole (PROTONIX) EC tablet 40 mg  40 mg Oral Daily 40 mg at 12/31/21 0838    prazosin (MINIPRESS) capsule 3 mg  3 mg Oral Nightly 3 mg at 12/30/21 2045    sennosides (SENOKOT) tablet 8.6 mg  8.6 mg Oral Daily 8.6 mg at 12/31/21 0838    thiamine (VITAMIN B-1) tablet 100 mg  100 mg Oral Daily 100 mg at 12/31/21 3557    aspirin chewable tablet 81 mg  81 mg Oral Daily 81 mg at 12/31/21 0840    FLUoxetine (PROzac) capsule 20 mg  20 mg Oral Daily 20 mg at 12/31/21 0840    lidocaine (SALONPAS) 4 % patch 1 patch  1 patch Topical Q24H 1 patch at 12/31/21 0847         PRN Meds:  Current Facility-Administered Medications   Medication Dose Route Frequency Last Admin    fluticasone  1 spray Each Nostril BID PRN 1 spray at 12/31/21 1032    polyethylene glycol  17 g Oral Daily PRN 17 g at 12/31/21 0957    nicotine polacrilex  4 mg Oral Q2H PRN 4 mg at 12/31/21 0838    melatonin  3 mg Oral QHS PRN, MR X 1 3 mg at 12/28/21 0141    benzocaine-menthol  1 lozenge Buccal Q2H PRN      nystatin   Topical BID PRN Given at 12/24/21 1645    acetaminophen  650 mg Oral Q6H PRN 650 mg at 12/25/21 1106    albuterol HFA  2 puff Inhalation Q6H PRN 2 puff at 12/31/21 1001    diclofenac  2 g Topical TID PRN      docusate sodium  100 mg Oral Q12H PRN 100 mg at 12/31/21 0957    ondansetron  4 mg Oral Q8H PRN 4 mg at 12/28/21 0945    magnesium hydroxide  30 mL Oral Daily PRN 30 mL at 12/28/21 0946    aluminum-magnesium hydroxide-simethicone  30 mL Oral Q2H PRN 30 mL at 12/25/21 1106         VITALS:  Patient Vitals for the past 24 hrs:   BP Temp Temp src Pulse Resp SpO2   12/31/21 1001 -- -- -- 86 -- --   12/31/21 0840 131/83 -- -- -- 18 --   12/31/21 0725 131/83 97.8  F (36.6 C) Oral 86 18 90 %   12/30/21 2046 -- -- -- -- 18 --   12/30/21 2045 148/89 -- -- -- -- --   12/30/21 1823 -- -- -- -- -- 93 %   12/30/21 1531 (!) 149/91 98 F (36.7 C) Oral 95 18 94 %   12/30/21 1312 -- -- -- -- 18 --       MENTAL STATUS EXAM:   Mental Status Exam   General Appearance: Dressed in hospital gown/johnny  Behavior: Cooperative  Level of Consciousness: Alert  Orientation Level: Oriented x3  Attention/Concentration: WNL  Mannerisms/Movements: Abnormal gait  Speech Quality and Rate: WNL  Speech Clarity: Clear  Speech Tone: Normal vocal inflection  Vocabulary/Fund of Knowledge: WNL  Memory: Intact  Thought Process & Associations: Logical  Dissociative Symptoms: None  Thought Content: No abnormalities reported or observed  Delusions: None  Hallucinations: None  Suicidal Thoughts: None  Homicidal Thoughts: None  Mood: Anxious  Affect: Congruent with mood  Judgment: Fair  Insight: Fair    C-SSRS:  Default Flowsheet Data (most recent)     C-SSRS Frequent Screener - 12/24/21 0005        C-SSRS Frequent Screener    Since last contact, have you actually had thoughts about killing yourself ?  Yes     Have you been thinking about how you might do this?  No     Have you had these thoughts and had some intention of acting on them?  No     Have you started to work out or worked out the details of how to kill yourself? Do you intend to carry out this plan? No     Have you done anything, started to do anything, or prepared to do anything to end your life?  No     C-SSRS Frequent Screener Risk Score Low Risk                 Default Flowsheet Data (most recent)     C-SSRS Risk Assessment - 12/23/21 1344        C-SSRS Risk Assessment    Suicidal and Self-Injurious Behavior (Past 3 Months) Aborted or Self-Interrupted attempt     Suicidal and Self-Injurious Behavior (Lifetime) Actual suicide attempt;Self-injurious behavior without suicidal intent     Suicidal Ideation Check Most Severe in Past Month Wish to be  dead     Activating Events (Recent) Current or pending isolation or feeling alone     Treatment History Hopeless or dissatisfied with treatment;Non-compliant with treatment     Protective Factors (Recent) Identifies reasons for  living;Supportive social network or family;Belief that suicide is immoral, high spirituality;Responsibility to family or others, living with family;Fear of death or dying due to pain and suffering     Any suicidal, self-injurious, or aggressive behaviors?  Yes     Describe any suicidal, self-injurious or aggressive behavior (INCLUDE DATES) cutting behavior multiple episodes, last time during teh last month                 Physical/Somatic Complaints  The patient lists: pain, shortness of breath, unsteady gait.          DSM-V    Primary Diagnosis: Major depressive disorder, recurrent episode, severe   ([name])   Secondary Diagnosis: Borderline personality disorder ([name])   General medical conditions: Seizure Disorder, Hyperlipidemia, Diabetes   Mellitus, Type II, Coronary Artery Disease, Chronic Pain, Chronic   Obstructive Pulmonary Disease  Psychosocial and Contextual Factors: Housing problems, Problems related to   social environment, Problems with access to health care services, Problems   with primary support group           Assessment  Ahrianna Glassco is a 51 year old English-speaking woman with longstanding psych history, including chart diagnoses of PTSD, SCAD, BPAD, and PSUD,multiple hospitalizations starting in childhood, onantipsychotic medication since age 9,multiple prior suicide attempts and SIB requiring medical attention,severe head trauma and seizure d/o, and complex medical illness, who lives in a group home,presenting to ED with increasing suicidal ideation,hallucinations,and trauma reexperiencing symptoms initially admitted to medicine because of hypoxia and transferred to geriatric psychiatry because of ongoing suicidality.     Diagnostic uncertainty given  overlapinchart diagnoses. Patient identifies her most concerning symptoms are those associated with PTSD,namely nightmares, intrusive memories.Visual hallucinations may be decompensation of SCAD,trauma-related(e.g. flashbacks),major depressive episode with psychotic features, or transient psychosis in the setting of stress as seen in some personality disorders. Memory deficits on exam may be due to severe history of head injury, polypharmacy and substance use,or vitamin deficiency.    In terms of risk, patient endorsed SI with intent and method(states she will jump in front of a train at the nearest station as soon as she is discharged) on admissionand believed that ending her life will reunite her and have been with her deceased son. She currently denies those thoughts. Her trauma-reexperiencing and psychotic symptoms related to her son are heightening her risk of suicide. Additional risk factors include history of multiple attempts, recent SIB requiring medical attention, history of head trauma, comorbid substance use, preparatory behaviors/attempt in ED,impulsivity,perceived lack of social support, and chronic pain.There are a few mitigating risk factors and patient at  is  agreeable to safety planning. She feels much better ad is ready to discuss dc options and placement.     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 prozac 20 mg po daily    Anxiety:  Cont. Lorazepam 0.5 mg po daily prn (lowered from 1 mg twice a day), plan to discontinue    #Dyspnea on exertion  #Peripheral edema  #HFpEF (EF 70%, no WMA on TTE 12/07/21)  #OHS, OSA  #COPD/asthma  -feels dyspnea, peripheral edema worse since recent hospital discharge, missing about ~ 1d of medication  -possibly w/ mild CHF exacerbation from missed diuretic doses  -no e/o infection or copd exacerbation  -increase torsemide to 120 bid (similar to recent admission, requiring an increased dose briefly) (day  team to follow-up daily to adjust dose prn. Had been discharged on torsemide 100 bid)  -  I/Os, daily weights, vitals  -SpO2 goal 88-92, if SpO2 >90, does not need supplemental O2 (may need it only overnight 1-2L)  -admitted on ~2 L supplemental O2 w/ SpO2 >92 (baseline is closer to ~90 on chart review); on review of current presentation, lowest O2 on flow sheet was 91 on room air  -c/w ICS/LABA, SABA prn  -f/u nt-pro-bnp and repeat basic labs    #burning sensation in lungs/right back  -suspect due to recent cocaine use (onset after hospital d/c, cocaine use)  -no concerning skin changes on back  -no concerning urinary sxs, f/u ua  -trial of gi cocktail and lidocaine patch    #complex chronic pain syndrome  #lumbar spinal stenosis w/ sciatica  #L knee pain, improved  #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  -had mechanical fall while on recent psych admission resulting in increased L knee pain and difficulty ambulating, requiring transfer to medicine  -evaluated by ortho who felt MRI L knee findings appeared chronic, seen by PT who recommended skilled PT and STR  -c/w tylenol, naproxen, suboxone, gabapentin, robaxin    #PSUD  -recovery coach consult  -uds + for marijuana, cocaine, suboxone  -c/w suboxone tid (takes also for chronic pain)  -cessation, NRT    #Seizure d/o  -c/w keppra, trileptal    #HLD  -c/w statin    #h/o HCV  -recent viral load undetectable, cleared on her own      #housing instability  -social work consult    Diet-cardiac  15 minutes checks  DVT prophylaxis-sch  Med rec- completew/ recent d/c med list  PCP-unclear; possiblyJean Maisie Fus, FNP  HCP-not on file  Code- full       Review with patient: Treatment plan reviewed with the patient.

## 2021-12-31 NOTE — Progress Notes (Addendum)
Patient?was intermittently visible sitting in the group room watching TV, in room at times resting. Ambulated independently without the walker stated " I don't need it".Took ?medications as ordered including prn nicorette gums, prn voltaren gel for back discomfort with relief,  prn Miralax, prn colace, prn ativan for anxiety, prn Flonase for congestion, prn albuterol inhaler for wheezing with good effect, no SOB reported, O2 sat 93% on RA, non compliant with the oxygen.?Continues on azithromycin, no adverse reactions noted, afebrile. She ate 90% and of her meals. Declined groups, using tablet watching movies in room.?No  behavioral issues, denies SI/HI/AH/VH. Reported last BM was on 12/29/21, requested and received prn bowel meds result pending. Assisted as needed.   BP 131/83   Pulse 86   Temp 97.8 ?F (36.6 ?C) (Oral)   Resp 18   Ht 5\' 9"  (1.753 m)   Wt (!) 157.5 kg (347 lb 3.2 oz)   SpO2 90%   BMI 51.27 kg/m?

## 2021-12-31 NOTE — Group Note (Signed)
OT Psych Group Documentation    Topics: Project      Occupations Addressed: Leisure, Social participation     Skills addressed: Fine/gross Chemical engineer, process skills, social interaction skills, behavioral control, mental functions                     Date of group: 12/31/2021  Start Time: 1100  End Time: 1145    Attendance: Declined a direct invite      Beatriz Stallion, OT [phone]

## 2021-12-31 NOTE — Group Note (Signed)
OT Psych Group Documentation    Topics: Feel Good      Occupations Addressed: Activities of daily living (functional mobility), Instrumental activities of daily living (health management & maintenance), Leisure, Social participation      Skills addressed: Fine/gross motor skills, social interaction skills, process skills, emotional regulation, mental functions               Date of group: 12/31/2021  Start Time: 1015  End Time: 1100    Attendance: [place] [id number]

## 2022-01-01 LAB — BLOOD SUGAR FINGERSTICK (POINT OF CARE): FINGERSTICK GLUCOSE: 165 mg/dl — ABNORMAL HIGH (ref 74–160)

## 2022-01-01 NOTE — Event Note (Signed)
RR called for weakness/dizzy.  Pt awake, vitals wnl

## 2022-01-01 NOTE — Progress Notes (Signed)
Patient has been visible sitting in the group room watching TV, in room in the afternoon.Took ?medications as ordered including prn nicorette gums, prn voltaren gel for back discomfort with relief, O2 sat 93% on 3L, no sob reported, non compliant with the oxygen despite education. She ate 80% and 100% of?her meals. Attended morning group. ?No ?behavioral issues, denies SI/HI/AH/VH. Reported last BM was on 12/29/21, no result from prn colace and Miralax, requested and received prn MOM result pending. Assisted as needed.   BP 119/80   Pulse 90   Temp 98 ?F (36.7 ?C)   Resp 18   Ht 5\' 9"  (1.753 m)   Wt (!) 152.1 kg (335 lb 6.4 oz)   SpO2 93%   BMI 49.53 kg/m?

## 2022-01-01 NOTE — Progress Notes (Signed)
Patient was visible sitting in the group room watching TV, labile mood, irritable at times easly redirected, social with peers.Took ?medications as ordered including prn nicorette gums for craving,?O2 sat 94% on 3L, no sob reported.?She ate?75% of?her dinner.?Attended groups. Assisted with shower and shave. No ?behavioral issues, no safety concerns, denies SI/HI/AH/VH.?Reported + BM from MOM. Assisted as needed.  BP 145/84   Pulse 90   Temp 97.5 ?F (36.4 ?C) (Oral)   Resp 18   Ht 5\' 9"  (1.753 m)   Wt (!) 152.1 kg (335 lb 6.4 oz)   SpO2 94%   BMI 49.53 kg/m?

## 2022-01-01 NOTE — Event Note (Addendum)
12.15 am Nurse had just assisted pt to room ambulate using her walker, sat on bed, pt c/o dizziness and fell back in bed, 02 sat 83-88 % o2 was immediately applied @ 3L/min. Pt became unresponsive, not responding to verbal stimuli or sternum rub. Rapid response was initiated. FS 162. O2 was increased to 4 L/min,  02 Sat went up to 90-94%  per MD. Incident ended at 12.15 am pt remain on unit to monitor and encouraged 02 compliance. Prior to incident pt was sitting in milieu,  3-11 reported that pt was not compliance with 02.

## 2022-01-01 NOTE — Progress Notes (Signed)
But different aspects of life education and work [name] has a lot to say     PSYCH PROGRESS NOTE    Identifying Data:  Cheryl Zimmerman is a 51 year old English-speaking woman with longstanding psych history, including chart diagnoses of PTSD, SCAD, BPAD, and PSUD,multiple hospitalizations starting in childhood, onantipsychotic medication since age 80,multiple prior suicide attempts and SIB requiring medical attention,severe head trauma and seizure d/o, and complex medical illness, including in the ED where she attempted to strangle herself with EKG wires, (04/2021),  PMH notable for chronic systolic congestive heart failure, seizure disorder,on Keppra,pain syndrome, COPD, Bronchial asthma, morbid obesity, who until recently lived in a group home, but left voluntarilyadmittedwith suicidal ideation with a plan to jump in front of the train or overdose on pills that was transferred from [name] because of complaints of suicidality..      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 Reviewed chart.    Discussed with nursing staff.   Patient is visible in the milieu but irritable at times.  Staff report patient has not been very compliant with putting on her oxygen.  Rapid response was called overnight because she was somewhat unresponsive and her oxygen saturation had gone down to the 80s.  Has been eating well.  Slept about 4-1/2 hours overnight.  Compliant with medications but not always keeping her oxygen on.  Also utilizing PRN acetaminophen for generalized pain and ondansetron for nausea.  No agitation or other behavioral issues.  Patient reports doing "good".   She was walking around without her oxygen and I encouraged her to keep her oxygen on.  Reports good sleep and appetite.   Denies suicidal ideation or homicidal ideation.  Denies auditory hallucinations or visual hallucinations.  Denies any pain.  Denies any problems.    Allergies:  Review of Patient's Allergies indicates:   Bee venom               Anaphylaxis   Carbamazepine           Hives   Methylprednisolone      Dizziness, Drowsiness    Comment:Also believes syncope. Seems to tolerate             inhalational and epidural steroid.   Nutritional supplem*       Hydroxyzine  Nausea Only    Scheduled Meds:  Current Facility-Administered Medications   Medication Dose Route Frequency Last Admin    azithromycin (ZITHROMAX) tablet 500 mg  500 mg Oral Once per day on Mon Wed Fri 500 mg at 12/31/21 1610    clotrimazole (LOTRIMIN AF) 1 % cream   Topical BID Given at 01/01/22 0830    melatonin tablet 3 mg  3 mg Oral Nightly 3 mg at 12/31/21 1902    QUEtiapine (SEROquel) tablet 50 mg  50 mg Oral Nightly 50 mg at 12/31/21 1903    benzonatate (TESSALON) capsule 100 mg  100 mg Oral TID 100 mg at 01/01/22 1351    metolazone (ZAROXOLYN) tablet 5 mg  5 mg Oral Daily 5 mg at 01/01/22 0901    torsemide (DEMADEX) tablet 120 mg  120 mg Oral TID 120 mg at 01/01/22 1351    atorvastatin (LIPITOR) tablet 80 mg  80 mg Oral Daily 80 mg at 01/01/22 0900     budesonide-formoterol (SYMBICORT) 160-4.5 MCG/ACT inhaler 2 puff  2 puff Inhalation 2 times daily 2 puff at 01/01/22 0902    buprenorphine-naloxone (SUBOXONE) 8-2 MG SL tablet 1 tablet  8 mg Sublingual TID 1 tablet at 01/01/22 1351    gabapentin (NEURONTIN) capsule 600 mg  600 mg Oral TID 600 mg at 01/01/22 1351    levETIRAcetam (KEPPRA) tablet 750 mg  750 mg Oral BID 750 mg at 01/01/22 0900    levothyroxine (SYNTHROID) tablet 25 mcg  25 mcg Oral DAILY 25 mcg at 01/01/22 0420    loratadine (CLARITIN) tablet 10 mg  10 mg Oral Daily 10 mg at 01/01/22 0901    loxapine (LOXITANE) capsule 25 mg  25 mg Oral BID 25 mg at 01/01/22 0900    melatonin tablet 3 mg  3 mg Oral Nightly 3 mg at 12/31/21 1902    methocarbamol (ROBAXIN) tablet 750 mg  750 mg Oral TID 750 mg at 01/01/22 1353    naproxen (NAPROSYN) tablet 500 mg  500 mg Oral BID WC 500 mg at 01/01/22 0900    nicotine (NICODERM CQ) 21 MG/24HR 1 patch  1 patch Transdermal Daily 1 patch at 01/01/22 0909    OXcarbazepine (TRILEPTAL) tablet 150 mg  150 mg Oral BID 150 mg at 01/01/22 0900    pantoprazole (PROTONIX) EC tablet 40 mg  40 mg Oral Daily 40 mg at 01/01/22 0901    prazosin (MINIPRESS) capsule 3 mg  3 mg Oral Nightly 3 mg at 12/31/21 1904    sennosides (SENOKOT) tablet 8.6 mg  8.6 mg Oral Daily 8.6 mg at 01/01/22 0900    thiamine (VITAMIN B-1) tablet 100 mg  100 mg Oral Daily 100 mg at 01/01/22 0900    aspirin chewable tablet 81 mg  81 mg Oral Daily 81 mg at 01/01/22 0900    FLUoxetine (PROzac) capsule 20 mg  20 mg Oral Daily 20 mg at 01/01/22 0901    lidocaine (SALONPAS) 4 % patch 1 patch  1 patch Topical Q24H 1 patch at 01/01/22 0944         PRN Meds:  Current Facility-Administered Medications   Medication Dose Route Frequency Last Admin    fluticasone  1 spray Each Nostril BID PRN 1 spray at 12/31/21 1032    polyethylene glycol  17 g Oral Daily PRN 17 g at 12/31/21 0957    nicotine polacrilex  4 mg Oral Q2H PRN 4 mg at 01/01/22 0900     melatonin  3  mg Oral QHS PRN, MR X 1 3 mg at 12/28/21 0141    benzocaine-menthol  1 lozenge Buccal Q2H PRN      nystatin   Topical BID PRN Given at 12/24/21 1645    acetaminophen  650 mg Oral Q6H PRN 650 mg at 01/01/22 0224    albuterol HFA  2 puff Inhalation Q6H PRN 2 puff at 01/01/22 0226    diclofenac  2 g Topical TID PRN 2 g at 01/01/22 0904    docusate sodium  100 mg Oral Q12H PRN 100 mg at 12/31/21 0957    ondansetron  4 mg Oral Q8H PRN 4 mg at 01/01/22 0421    magnesium hydroxide  30 mL Oral Daily PRN 30 mL at 01/01/22 1134    aluminum-magnesium hydroxide-simethicone  30 mL Oral Q2H PRN 30 mL at 12/25/21 1106         VITALS:  Patient Vitals for the past 24 hrs:   BP Temp Temp src Pulse Resp SpO2 Weight   01/01/22 1618 -- -- -- 90 -- -- --   01/01/22 1533 145/84 97.5 F (36.4 C) Oral 101 18 94 % --   01/01/22 1351 -- -- -- -- 18 -- --   01/01/22 0901 119/80 -- -- -- -- -- --   01/01/22 0900 -- -- -- -- 18 -- --   01/01/22 0748 119/80 98 F (36.7 C) -- 90 18 93 % --   01/01/22 0455 -- -- -- -- -- -- (!) 152.1 kg (335 lb 6.4 oz)   01/01/22 0226 -- -- -- 82 -- -- --   01/01/22 0217 111/77 97.5 F (36.4 C) -- 95 20 94 % --   01/01/22 0144 97/66 98 F (36.7 C) -- 82 16 90 % --   01/01/22 0034 116/76 97.8 F (36.6 C) -- 89 -- 93 % --   12/31/21 1904 123/76 -- -- -- -- -- --   12/31/21 1903 -- -- -- -- 18 -- --       LABS:  Admission on 12/22/2021   Component Date Value Ref Range Status    FINGERSTICK GLUCOSE 01/01/2022 165 (H)  74 - 160 mg/dl Final    Comment: If peripheral circulation is impaired, collection of  capillary blood from the approved sample sites is not  advised as the results might not be a true reflection of the  physiological blood glucose level. This may apply in the  following circumstances: severe dehydration as a result of  diabetic ketoacidosis or due to hyperglycemic hyperosmolar  non-ketotic syndrome, hypotension, shock, decompensated  heart failure NYHA Class IV, or peripheral  arterial  occlusive disease.       MENTAL STATUS EXAM:   Mental Status Exam   General Appearance: Dressed in hospital gown/johnny  Behavior: Cooperative  Level of Consciousness: Alert  Orientation Level: Oriented x3  Attention/Concentration: WNL  Mannerisms/Movements: Abnormal gait  Speech Quality and Rate: WNL  Speech Clarity: Clear  Speech Tone: Normal vocal inflection  Vocabulary/Fund of Knowledge: WNL  Memory: Intact  Thought Process & Associations: Logical  Dissociative Symptoms: None  Thought Content: No abnormalities reported or observed  Delusions: None  Hallucinations: None  Suicidal Thoughts: None  Homicidal Thoughts: None  Mood: Anxious  Affect: Congruent with mood  Judgment: Fair  Insight: Fair    C-SSRS:  Default Flowsheet Data (most recent)     C-SSRS Frequent Screener - 12/24/21 0005        C-SSRS Frequent Screener  Since last contact, have you actually had thoughts about killing yourself ?  Yes     Have you been thinking about how you might do this?  No     Have you had these thoughts and had some intention of acting on them?  No     Have you started to work out or worked out the details of how to kill yourself? Do you intend to carry out this plan? No     Have you done anything, started to do anything, or prepared to do anything to end your life?  No     C-SSRS Frequent Screener Risk Score Low Risk                 Default Flowsheet Data (most recent)     C-SSRS Risk Assessment - 12/23/21 1344        C-SSRS Risk Assessment    Suicidal and Self-Injurious Behavior (Past 3 Months) Aborted or Self-Interrupted attempt     Suicidal and Self-Injurious Behavior (Lifetime) Actual suicide attempt;Self-injurious behavior without suicidal intent     Suicidal Ideation Check Most Severe in Past Month Wish to be dead     Activating Events (Recent) Current or pending isolation or feeling alone     Treatment History Hopeless or dissatisfied with treatment;Non-compliant with treatment     Protective Factors (Recent)  Identifies reasons for living;Supportive social network or family;Belief that suicide is immoral, high spirituality;Responsibility to family or others, living with family;Fear of death or dying due to pain and suffering     Any suicidal, self-injurious, or aggressive behaviors?  Yes     Describe any suicidal, self-injurious or aggressive behavior (INCLUDE DATES) cutting behavior multiple episodes, last time during teh last month                 Physical/Somatic Complaints  The patient lists: pain, shortness of breath, unsteady gait.          DSM-V    Primary Diagnosis: Major depressive disorder, recurrent episode, severe   ([name])   Secondary Diagnosis: Borderline personality disorder ([name])   General medical conditions: Seizure Disorder, Hyperlipidemia, Diabetes   Mellitus, Type II, Coronary Artery Disease, Chronic Pain, Chronic   Obstructive Pulmonary Disease  Psychosocial and Contextual Factors: Housing problems, Problems related to   social environment, Problems with access to health care services, Problems   with primary support group           Assessment  Cheryl Zimmerman is a 51 year old English-speaking woman with longstanding psych history, including chart diagnoses of PTSD, SCAD, BPAD, and PSUD,multiple hospitalizations starting in childhood, onantipsychotic medication since age 19,multiple prior suicide attempts and SIB requiring medical attention,severe head trauma and seizure d/o, and complex medical illness, who lives in a group home,presenting to ED with increasing suicidal ideation,hallucinations,and trauma reexperiencing symptoms initially admitted to medicine because of hypoxia and transferred to geriatric psychiatry because of ongoing suicidality.     Diagnostic uncertainty given overlapinchart diagnoses. Patient identifies her most concerning symptoms are those associated with PTSD,namely nightmares, intrusive memories.Visual hallucinations may be decompensation of  SCAD,trauma-related(e.g. flashbacks),major depressive episode with psychotic features, or transient psychosis in the setting of stress as seen in some personality disorders. Memory deficits on exam may be due to severe history of head injury, polypharmacy and substance use,or vitamin deficiency.    In terms of risk, patient endorsed SI with intent and method(states she will jump in front of a train at the nearest station as soon  as she is discharged) on admissionand believed that ending her life will reunite her and have been with her deceased son. She currently denies those thoughts. Her trauma-reexperiencing and psychotic symptoms related to her son are heightening her risk of suicide. Additional risk factors include history of multiple attempts, recent SIB requiring medical attention, history of head trauma, comorbid substance use, preparatory behaviors/attempt in ED,impulsivity,perceived lack of social support, and chronic pain.There are a few mitigating risk factors and patient at  is  agreeable to safety planning. She feels much better ad is ready to discuss dc options and placement.     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 fluoxetine 20 mg po daily    Anxiety:  Cont. Lorazepam 0.5 mg po daily prn (lowered from 1 mg twice a day), plan to discontinue    #Dyspnea on exertion  #Peripheral edema  #HFpEF (EF 70%, no WMA on TTE 12/07/21)  #OHS, OSA  #COPD/asthma  -feels dyspnea, peripheral edema worse since recent hospital discharge, missing about ~ 1d of medication  -possibly w/ mild CHF exacerbation from missed diuretic doses  -no e/o infection or copd exacerbation  -increase torsemide to 120 bid (similar to recent admission, requiring an increased dose briefly) (day team to follow-up daily to adjust dose prn. Had been discharged on torsemide 100 bid)  -I/Os, daily weights, vitals  -SpO2 goal 88-92, if SpO2 >90, does not need supplemental O2 (may need it only  overnight 1-2L)  -admitted on ~2 L supplemental O2 w/ SpO2 >92 (baseline is closer to ~90 on chart review); on review of current presentation, lowest O2 on flow sheet was 91 on room air  -c/w ICS/LABA, SABA prn  -f/u nt-pro-bnp and repeat basic labs    #burning sensation in lungs/right back  -suspect due to recent cocaine use (onset after hospital d/c, cocaine use)  -no concerning skin changes on back  -no concerning urinary sxs, f/u ua  -trial of gi cocktail and lidocaine patch    #complex chronic pain syndrome  #lumbar spinal stenosis w/ sciatica  #L knee pain, improved  #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  -had mechanical fall while on recent psych admission resulting in increased L knee pain and difficulty ambulating, requiring transfer to medicine  -evaluated by ortho who felt MRI L knee findings appeared chronic, seen by PT who recommended skilled PT and STR  -c/w tylenol, naproxen, suboxone, gabapentin, robaxin    #PSUD  -recovery coach consult  -uds + for marijuana, cocaine, suboxone  -c/w suboxone tid (takes also for chronic pain)  -cessation, NRT    #Seizure d/o  -c/w keppra, trileptal    #HLD  -c/w statin    #h/o HCV  -recent viral load undetectable, cleared on her own      #housing instability  -social work consult    Diet-cardiac  15 minutes checks  DVT prophylaxis-sch  Med rec- completew/ recent d/c med list  PCP-unclear; possiblyJean Maisie Fus, FNP  HCP-not on file  Code- full       Review with patient: Treatment plan reviewed with the patient.

## 2022-01-01 NOTE — Plan of Care (Signed)
Problem: Mood-related symptoms  Description: Pt. Is a 51 year old female with chronic mental illness who recently left group home and now homeless. PMH: obesity, COPD, CHF, spinal stenosis. Diagnosis MDD with SI. Pt. Reports no SI on admission.   Goal: Reduction in Intensity and Frequency (Mood)  Description: Pt. Will seek out staff if having any Suicidal ideation on day one of admission. Pt. Will be medication adherent on day one of admission. Pt. Will attend scheduled groups daily on day two of admission.  Outcome: Progressing  Received patient sleeping in dining room chair. Approximately 0015 assisted patient to room/bed. Writer applied 02 sat which was 83-88% RA as patient has been noncompliant, despite education & much encouragement, with NC @ 3L previously reporting "I don't need it." Patient sitting at bedside in drowsy state. Encouraged to do controlled breathing r/t dsypnea with exertion. Patient compliant & requested ginger ale. Sitting at bedside in no apparent distress when this RN departed room. Returned with beverage & patient remained sitting at bedside. Writer continued to encourage breathing technique. Patient then fell back in bed & was nonresponsive despite multiple sternal rubs. Rapid Response initiated. 02 reapplied increasing to 4L NC. Staff RN applied sternal rub & patient roused slightly with visable RR. VSS: 116/76, 93% 4L, P-89, RR 16, & 97.8. BG: 165. Patient assessed by Hospitalist & no new orders. F/u VS: 97/66, 90% 4L, P-82, RR 20, & 98.0. 0224 administered PRN Tylenol r/t c/o 5/10 generalized pain & inhaler r/t c/o SOB. Reports some effect from Tylenol. Inhaler with good result. Secondary f/u VSS: 111/77, 95% 4L, P-95, RR-20, & 97.5. Patient has been assisted to preferred bathroom by the group room w/2 staff assist via w/c & portable 02. Remains in drowsy state, yet transfers from w/c to toilet & back w/o difficulty. During rounds following rapid response, patient has appeared to  rest peacefully with [facility] @ 90 degrees. 0421 after 2nd transfer to bathroom, patient reports nausea & received Zofran with good results. 3rd time to bathroom to void @ 0650. Requested to be seated in the dining room. No behavioral concerns. Poor safety awareness.    *Sleep hours:  4.3  *LBM: 4/12  *Code status: Full  *Legal status: CV

## 2022-01-01 NOTE — Group Note (Signed)
OT Psych Group Documentation    Topics: Breakfast Bites      Occupations Addressed: Activities of Daily living, Social participation, Instrumental activities of daily living (health management & maintenance)     Skills addressed: Fine/gross motor skills, process skills, social interaction skills, behavioral control, mental functions (cognitive, perceptual, affective)                Date of group: 01/01/2022  Start Time: 0800  End Time: 0900    Attendance: Attended entire group  Participation/Patient Response: Interactive with others and Responsive to cues/limits/redirection from staff  Participation within a task: Able to complete task with minimal assistance, Demonstrated ability to make decisions and Social with peers  Treatment Goals Addressed: Development/maintenance of a health lifestyle, Increased attention/concentration/focus, Increased communication skills, Increased independence in functioning and Increased socializaton/social skills    Comments: A bit demanding, irritable at times when waiting for assistance, limited patience. Spilled eggs and coffee. Social with select peer.     Laverda Sorenson, OT [phone]

## 2022-01-01 NOTE — Event Note (Signed)
Rapid response note    Immediately went to bedside because patient passed out after being in bed and was not opening her eyes even to sternal rub.  She was placed on 4 L/min of oxygen via nasal cannula as she normally be at baseline for management of chronic hypoxemic respiratory failure.  Her sats were initially 83% but went up to 93% on 4 L/min of oxygen.  Fingerstick glucose was 165.  She was able to open her eyes after a few mom ents and was speaking in full sentences, alert and oriented x3.  She said that she felt lightheaded but felt better after a few minutes of being on the oxygen.    It is completely possible that she may have been hypoxemic enough to not meet the demands of her body versus psychogenic factors.  We will continue to monitor.

## 2022-01-02 NOTE — Plan of Care (Addendum)
97.8 95 20 115/67 02 SAT 99 % on 2 L NC. No noted Covid sx. Pt. received sitting in day room at beginning of shift sleeping.  Pt. c/o her roommate is to noisy and she can not  sleep. Ate 100 % of breakfast. No voiced SI/HI,AH/VH. Medication compliant. Spent time watching TV and social with select peers. Ate 100 % of lunch. Attended groups.  Pt. noted irritable with c/o of not sleeping d/t roommate. Pt's room changed. Nicorette gum given prn x 2. No safety issues noted. Pt. Needing frequent reminders to wear oxygen. Non compliant with 02.  No respiratory distress noted. 02 SAT 93 % RA. Will monitor.

## 2022-01-02 NOTE — Plan of Care (Signed)
Problem: Psychosis Symptoms  Goal: Reduction in Intensity and Frequency (Psychosis)  Outcome: Progressing  Received active in the milieu interacting with peers. No COVID sx. No c/o pain/SI/HI/AH/VH. 15 minute safety checks. 83% RA as patient continues to refuse 02. Repeat 02 94%. No cyanosis or respiratory distress. Numerous nursing staff has educated & encouraged to wear 02. "I don't need it" despite Rapid Response r/t respiratory concern on 4/15. Also intermittent use of walker though previously educated r/t safety. Disregards safety parameters. Participated in dinner/music group. Consumed 100%. Patient reports constipation. 2023 administered MOM. 2240 approached & informed writer that still did not have a BM. Educated that it may take 6-8 hours. Patient expressed frustration related to timeframe. "I probably won't sleep tonight." Scant erythema under R breast & under abd fold. No open skin. No behavioral or safety concerns.

## 2022-01-02 NOTE — Progress Notes (Signed)
But different aspects of life education and work [name] has a lot to say     PSYCH PROGRESS NOTE    Identifying Data:  Cheryl Zimmerman is a 51 year old English-speaking woman with longstanding psych history, including chart diagnoses of PTSD, SCAD, BPAD, and PSUD,multiple hospitalizations starting in childhood, onantipsychotic medication since age 27,multiple prior suicide attempts and SIB requiring medical attention,severe head trauma and seizure d/o, and complex medical illness, including in the ED where she attempted to strangle herself with EKG wires, (04/2021),  PMH notable for chronic systolic congestive heart failure, seizure disorder,on Keppra,pain syndrome, COPD, Bronchial asthma, morbid obesity, who until recently lived in a group home, but left voluntarilyadmittedwith suicidal ideation with a plan to jump in front of the train or overdose on pills that was transferred from [name] because of complaints of suicidality..      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 Reviewed chart.    Discussed with nursing staff.   Staff report that patient is visible in the milieu.  Usually not compliant with keeping her oxygen on but she has been doing fine.  Spent some time watching TV.  Mood noted to be labile at times and irritable during interactions with staff.  She is living with a few other patients.  Has been eating well.  Slept about 6 hours overnight.  No agitation or other behavioral issues.  Patient reports doing "okay".   She was eating lunch without her oxygen and I encouraged her to keep her oxygen on.  Reports good sleep and appetite.   Denies suicidal ideation or homicidal ideation.  Denies auditory hallucinations or visual hallucinations.  Denies any pain.  Denies any problems.    Allergies:  Review of Patient's Allergies indicates:   Bee venom               Anaphylaxis   Carbamazepine           Hives   Methylprednisolone      Dizziness, Drowsiness    Comment:Also believes syncope. Seems to tolerate             inhalational and epidural steroid.   Nutritional supplem*       Hydroxyzine             Nausea Only  Scheduled Meds:  Current Facility-Administered Medications   Medication Dose Route Frequency Last Admin    azithromycin (ZITHROMAX) tablet 500 mg  500 mg Oral Once per day on Mon Wed Fri 500 mg at 12/31/21 9562    clotrimazole (LOTRIMIN AF) 1 % cream   Topical BID Given at 01/02/22 0828    melatonin tablet 3 mg  3 mg Oral Nightly 3 mg at 01/01/22 2003    QUEtiapine (SEROquel) tablet 50 mg  50 mg Oral Nightly 50 mg at 01/01/22 1931    benzonatate (TESSALON) capsule 100 mg  100 mg Oral TID 100 mg at 01/02/22 1422    metolazone (ZAROXOLYN) tablet 5 mg  5 mg Oral Daily 5 mg at 01/02/22 1308    torsemide (DEMADEX) tablet 120 mg  120 mg Oral TID 120 mg at 01/02/22 1422    atorvastatin (LIPITOR) tablet 80 mg  80 mg Oral Daily 80 mg at 01/02/22 0823    budesonide-formoterol (SYMBICORT) 160-4.5 MCG/ACT inhaler 2 puff  2 puff Inhalation 2 times daily 2 puff at 01/02/22 0828     buprenorphine-naloxone (SUBOXONE) 8-2 MG SL tablet 1 tablet  8 mg Sublingual TID 1 tablet at 01/02/22 1422    gabapentin (NEURONTIN) capsule 600 mg  600 mg Oral TID 600 mg at 01/02/22 1422    levETIRAcetam (KEPPRA) tablet 750 mg  750 mg Oral BID 750 mg at 01/02/22 0825    levothyroxine (SYNTHROID) tablet 25 mcg  25 mcg Oral DAILY 25 mcg at 01/02/22 0549    loratadine (CLARITIN) tablet 10 mg  10 mg Oral Daily 10 mg at 01/02/22 0826    loxapine (LOXITANE) capsule 25 mg  25 mg Oral BID 25 mg at 01/02/22 0823    melatonin tablet 3 mg  3 mg Oral Nightly 3 mg at 01/01/22 1923    methocarbamol (ROBAXIN) tablet 750 mg  750 mg Oral TID 750 mg at 01/02/22 1421    naproxen (NAPROSYN) tablet 500 mg  500 mg Oral BID WC 500 mg at 01/02/22 0817    nicotine (NICODERM CQ) 21 MG/24HR 1 patch  1 patch Transdermal Daily 1 patch at 01/02/22 0838    OXcarbazepine (TRILEPTAL) tablet 150 mg  150 mg Oral BID 150 mg at 01/02/22 0826    pantoprazole (PROTONIX) EC tablet 40 mg  40 mg Oral Daily 40 mg at 01/02/22 0825    prazosin (MINIPRESS) capsule 3 mg  3 mg Oral Nightly 3 mg at 01/01/22 1923    sennosides (SENOKOT) tablet 8.6 mg  8.6 mg Oral Daily 8.6 mg at 01/02/22 6578    thiamine (VITAMIN B-1) tablet 100 mg  100 mg Oral Daily 100 mg at 01/02/22 0827    aspirin chewable tablet 81 mg  81 mg Oral Daily 81 mg at 01/02/22 0816    FLUoxetine (PROzac) capsule 20 mg  20 mg Oral Daily 20 mg at 01/02/22 0827    lidocaine (SALONPAS) 4 % patch 1 patch  1 patch Topical Q24H 1 patch at 01/02/22 0827         PRN Meds:  Current Facility-Administered Medications   Medication Dose Route Frequency Last Admin    fluticasone  1 spray Each Nostril BID PRN 1 spray at 12/31/21 1032    polyethylene glycol  17 g Oral Daily PRN 17 g at 12/31/21 0957    nicotine polacrilex  4 mg Oral Q2H PRN 4 mg at 01/02/22 1250    melatonin  3 mg Oral QHS PRN, MR X  1 3 mg at 12/28/21 0141    benzocaine-menthol  1 lozenge Buccal Q2H PRN      nystatin    Topical BID PRN Given at 12/24/21 1645    acetaminophen  650 mg Oral Q6H PRN 650 mg at 01/01/22 0224    albuterol HFA  2 puff Inhalation Q6H PRN 2 puff at 01/01/22 0226    diclofenac  2 g Topical TID PRN 2 g at 01/01/22 0904    docusate sodium  100 mg Oral Q12H PRN 100 mg at 12/31/21 0957    ondansetron  4 mg Oral Q8H PRN 4 mg at 01/01/22 0421    magnesium hydroxide  30 mL Oral Daily PRN 30 mL at 01/01/22 1134    aluminum-magnesium hydroxide-simethicone  30 mL Oral Q2H PRN 30 mL at 12/25/21 1106         VITALS:  Patient Vitals for the past 24 hrs:   BP Temp Temp src Pulse Resp SpO2   01/02/22 1422 -- -- -- -- 20 --   01/02/22 0828 -- -- -- -- 20 --   01/02/22 0823 115/67 -- -- -- -- --   01/02/22 0821 115/67 97.8 F (36.6 C) TEMPORAL 95 20 99 %   01/02/22 0630 -- -- -- 94 -- 93 %   01/01/22 1923 145/84 -- -- -- 18 --   01/01/22 1618 -- -- -- 90 -- --   01/01/22 1533 145/84 97.5 F (36.4 C) Oral 101 18 94 %       LABS:  Admission on 12/22/2021   Component Date Value Ref Range Status    FINGERSTICK GLUCOSE 01/01/2022 165 (H)  74 - 160 mg/dl Final    Comment: If peripheral circulation is impaired, collection of  capillary blood from the approved sample sites is not  advised as the results might not be a true reflection of the  physiological blood glucose level. This may apply in the  following circumstances: severe dehydration as a result of  diabetic ketoacidosis or due to hyperglycemic hyperosmolar  non-ketotic syndrome, hypotension, shock, decompensated  heart failure NYHA Class IV, or peripheral arterial  occlusive disease.       MENTAL STATUS EXAM:   Mental Status Exam   General Appearance: Dressed in hospital gown/johnny  Behavior: Cooperative  Level of Consciousness: Drowsy  Orientation Level: Oriented x3  Attention/Concentration: WNL  Mannerisms/Movements: Abnormal gait  Speech Quality and Rate: WNL  Speech Clarity: Clear  Speech Tone: Normal vocal inflection  Vocabulary/Fund of Knowledge:  WNL  Memory: Intact  Thought Process & Associations: Internally preoccupied  Dissociative Symptoms: None  Thought Content: Preoccupations  Delusions: None  Hallucinations: None  Suicidal Thoughts: None  Homicidal Thoughts: None  Mood: Irritable  Affect: Congruent with mood  Judgment: Poor  Insight: Poor    C-SSRS:  Default Flowsheet Data (most recent)     C-SSRS Frequent Screener - 12/24/21 0005        C-SSRS Frequent Screener    Since last contact, have you actually had thoughts about killing yourself ?  Yes     Have you been thinking about how you might do this?  No     Have you had these thoughts and had some intention of acting on them?  No     Have you started to work out or worked out the details of how to kill yourself? Do you intend to carry out this plan? No     Have you done anything, started to do  anything, or prepared to do anything to end your life?  No     C-SSRS Frequent Screener Risk Score Low Risk                 Default Flowsheet Data (most recent)     C-SSRS Risk Assessment - 12/23/21 1344        C-SSRS Risk Assessment    Suicidal and Self-Injurious Behavior (Past 3 Months) Aborted or Self-Interrupted attempt     Suicidal and Self-Injurious Behavior (Lifetime) Actual suicide attempt;Self-injurious behavior without suicidal intent     Suicidal Ideation Check Most Severe in Past Month Wish to be dead     Activating Events (Recent) Current or pending isolation or feeling alone     Treatment History Hopeless or dissatisfied with treatment;Non-compliant with treatment     Protective Factors (Recent) Identifies reasons for living;Supportive social network or family;Belief that suicide is immoral, high spirituality;Responsibility to family or others, living with family;Fear of death or dying due to pain and suffering     Any suicidal, self-injurious, or aggressive behaviors?  Yes     Describe any suicidal, self-injurious or aggressive behavior (INCLUDE DATES) cutting behavior multiple episodes, last time  during teh last month                 Physical/Somatic Complaints  The patient lists: pain, shortness of breath, unsteady gait.          DSM-V    Primary Diagnosis: Major depressive disorder, recurrent episode, severe   ([name])   Secondary Diagnosis: Borderline personality disorder ([name])   General medical conditions: Seizure Disorder, Hyperlipidemia, Diabetes   Mellitus, Type II, Coronary Artery Disease, Chronic Pain, Chronic   Obstructive Pulmonary Disease  Psychosocial and Contextual Factors: Housing problems, Problems related to   social environment, Problems with access to health care services, Problems   with primary support group           Assessment  Cheryl Zimmerman is a 51 year old English-speaking woman with longstanding psych history, including chart diagnoses of PTSD, SCAD, BPAD, and PSUD,multiple hospitalizations starting in childhood, onantipsychotic medication since age 61,multiple prior suicide attempts and SIB requiring medical attention,severe head trauma and seizure d/o, and complex medical illness, who lives in a group home,presenting to ED with increasing suicidal ideation,hallucinations,and trauma reexperiencing symptoms initially admitted to medicine because of hypoxia and transferred to geriatric psychiatry because of ongoing suicidality.     Diagnostic uncertainty given overlapinchart diagnoses. Patient identifies her most concerning symptoms are those associated with PTSD,namely nightmares, intrusive memories.Visual hallucinations may be decompensation of SCAD,trauma-related(e.g. flashbacks),major depressive episode with psychotic features, or transient psychosis in the setting of stress as seen in some personality disorders. Memory deficits on exam may be due to severe history of head injury, polypharmacy and substance use,or vitamin deficiency.    In terms of risk, patient endorsed SI with intent and method(states she will jump in front of a train at the nearest station  as soon as she is discharged) on admissionand believed that ending her life will reunite her and have been with her deceased son. She currently denies those thoughts. Her trauma-reexperiencing and psychotic symptoms related to her son are heightening her risk of suicide. Additional risk factors include history of multiple attempts, recent SIB requiring medical attention, history of head trauma, comorbid substance use, preparatory behaviors/attempt in ED,impulsivity,perceived lack of social support, and chronic pain.There are a few mitigating risk factors and patient at  is  agreeable to safety planning. She  feels much better ad is ready to discuss dc options and placement.     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 fluoxetine 20 mg po daily    Anxiety:  Cont. Lorazepam 0.5 mg po daily prn (lowered from 1 mg twice a day), plan to discontinue    #Dyspnea on exertion  #Peripheral edema  #HFpEF (EF 70%, no WMA on TTE 12/07/21)  #OHS, OSA  #COPD/asthma  -feels dyspnea, peripheral edema worse since recent hospital discharge, missing about ~ 1d of medication  -possibly w/ mild CHF exacerbation from missed diuretic doses  -no e/o infection or copd exacerbation  -increase torsemide to 120 bid (similar to recent admission, requiring an increased dose briefly) (day team to follow-up daily to adjust dose prn. Had been discharged on torsemide 100 bid)  -I/Os, daily weights, vitals  -SpO2 goal 88-92, if SpO2 >90, does not need supplemental O2 (may need it only overnight 1-2L)  -admitted on ~2 L supplemental O2 w/ SpO2 >92 (baseline is closer to ~90 on chart review); on review of current presentation, lowest O2 on flow sheet was 91 on room air  -c/w ICS/LABA, SABA prn  -f/u nt-pro-bnp and repeat basic labs    #burning sensation in lungs/right back  -suspect due to recent cocaine use (onset after hospital d/c, cocaine use)  -no concerning skin changes on back  -no concerning urinary  sxs, f/u ua  -trial of gi cocktail and lidocaine patch    #complex chronic pain syndrome  #lumbar spinal stenosis w/ sciatica  #L knee pain, improved  #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  -had mechanical fall while on recent psych admission resulting in increased L knee pain and difficulty ambulating, requiring transfer to medicine  -evaluated by ortho who felt MRI L knee findings appeared chronic, seen by PT who recommended skilled PT and STR  -c/w tylenol, naproxen, suboxone, gabapentin, robaxin    #PSUD  -recovery coach consult  -uds + for marijuana, cocaine, suboxone  -c/w suboxone tid (takes also for chronic pain)  -cessation, NRT    #Seizure d/o  -c/w keppra, trileptal    #HLD  -c/w statin    #h/o HCV  -recent viral load undetectable, cleared on her own      #housing instability  -social work consult    Diet-cardiac  15 minutes checks  DVT prophylaxis-sch  Med rec- completew/ recent d/c med list  PCP-unclear; possiblyJean Maisie Fus, FNP  HCP-not on file  Code- full       Review with patient: Treatment plan reviewed with the patient.

## 2022-01-02 NOTE — Plan of Care (Addendum)
Pt received sleeping. Awake around midnight and Independent with BR. Remain in DR O2 2 lit continue O2 sat 93%. Ate snacks and drinks. No apparent distress. No Covid symptoms noted. No behavioral issues noted. Slept for hrs. Continously redirected on keeping O2 on. Around 0500 pt was upset coz roommate awaken her from sleep, bed couldn't be changed due to need of medical bed and no bed avaiable at the time. will try to move pt around in the day. Meanwhile pt redirected and stayed in DR eating snacks and dozing off and on. Pt slept for 6.1 hrs. PRN nicotine gum at 0550 as per pt request.

## 2022-01-03 ENCOUNTER — Inpatient Hospital Stay (HOSPITAL_BASED_OUTPATIENT_CLINIC_OR_DEPARTMENT_OTHER): Payer: No Typology Code available for payment source

## 2022-01-03 DIAGNOSIS — R109 Unspecified abdominal pain: Secondary | ICD-10-CM

## 2022-01-03 MED ORDER — BISACODYL 10 MG PR SUPP
10.0000 mg | Freq: Every day | RECTAL | Status: DC | PRN
Start: 2022-01-03 — End: 2022-01-03
  Filled 2022-01-03: qty 1

## 2022-01-03 MED ORDER — MINERAL OIL PR ENEM
1.00 | ENEMA | Freq: Once | RECTAL | Status: AC
Start: 2022-01-03 — End: 2022-01-03
  Administered 2022-01-03: 1 via RECTAL
  Filled 2022-01-03: qty 1

## 2022-01-03 MED ORDER — BISACODYL 10 MG PR SUPP
10.00 mg | Freq: Once | RECTAL | Status: AC
Start: 2022-01-03 — End: 2022-01-03
  Administered 2022-01-03: 10 mg via RECTAL

## 2022-01-03 NOTE — Plan of Care (Signed)
Pt received sleeping. Awake around midnight and Independent with BR. O2 sat 88% on RA, O2 sat 90% on 2 lit O2. Ate snacks and drinks. No apparent distress. No Covid symptoms noted. No behavioral issues noted. Continously redirected on keeping O2 on. Pt remain in DR stating she could not breathe in her room and slept on the chair. Pt slept for 3.3 hrs. Pt C/O constipation, PRN colace and miralax given at 0525 result pending. Received ice pack for knee pain and pt redirected to bed to keep her feet up around 0530. Received PRN nicotine gum at 0516.

## 2022-01-03 NOTE — Miscellaneous (Signed)
Received call from psychiatry floor about this patient who is complaining of constipation, last BM 2 days ago. There is a consult order to medicine, but ordered as routine, not STAT. Pending phone call from primary team physician if they want [place] to see the patient this evening. As a courtesy, I've ordered a KUB.    Georgiann Cocker, MD  Instructor in Medicine, [facility]  ICU Hospitalist, [facility]  Pager #: [phone]

## 2022-01-03 NOTE — Consults (Signed)
Dulcolax and other things given already by primary team for constipation  KUB shows significant stool burden  She had two bowel movements downstairs while at radiology and once again upon returning to [facility]  She says she feels much better and wants to sleep  Constipation resolved by ambulation  No further intervention necessary at this time    Thank you for the consult (Level 1)    Everlene Farrier, MD  Instructor in Medicine, [facility]  ICU Hospitalist, [facility]  Pager #: [phone]

## 2022-01-03 NOTE — Progress Notes (Addendum)
Visible in the milieu, worried of not being able to move her bowels after she had Dulcolax Suppository during the morning shift. In addition Psychiatrist ordered Mineral Oil Enema Once. In the meantime, Pt had requested Colace 100 mg PRN and to be seen by the Hospitalist. XR Abdomen (KUB) was ordered. Awaiting for the results. Alert, anxious and irritable. No SOB reported. Continued on O2 at 2 L NC, but Pt was noncompliant w/ O2. She only had grapes and Prune Juice during supper. Positive medications. Independent w/ ADLs. Last BMs 01/03/22, three times after the Abdominal x-ray. No SI/HI. Safety checks as ordered. We will continue to monitor. BP 125/73   Pulse 82   Temp 98.1 ?F (36.7 ?C) (Oral)   Resp 18   Ht 5\' 9"  (1.753 m)   Wt (!) 152.1 kg (335 lb 6.4 oz)   SpO2 93%   BMI 49.53 kg/m?

## 2022-01-03 NOTE — Progress Notes (Signed)
But different aspects of life education and work [name] has a lot to say     PSYCH PROGRESS NOTE    Identifying Data:  Cheryl Zimmerman is a 51 year old English-speaking woman with longstanding psych history, including chart diagnoses of PTSD, SCAD, BPAD, and PSUD,multiple hospitalizations starting in childhood, onantipsychotic medication since age 80,multiple prior suicide attempts and SIB requiring medical attention,severe head trauma and seizure d/o, and complex medical illness, including in the ED where she attempted to strangle herself with EKG wires, (04/2021),  PMH notable for chronic systolic congestive heart failure, seizure disorder,on Keppra,pain syndrome, COPD, Bronchial asthma, morbid obesity, who until recently lived in a group home, but left voluntarilyadmittedwith suicidal ideation with a plan to jump in front of the train or overdose on pills that was transferred from [name] because of complaints of suicidality..      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 Reviewed chart.    Discussed with nursing staff.   Staff report patient is often not compliant with keeping her oxygen on reporting "I do not need it".  However, no apparent distress noted in terms of her respiration or her mentation.  She is visible in the milieu.  Noted to be irritable and with labile mood.  Reporting constipation to staff.  She has been eating well.  Slept only a little over 3 hours overnight.  No agitation or other behavioral issues.  Patient reports doing "okay".   She was eating lunch without her oxygen and I encouraged her to keep her oxygen on.  Reports good sleep and appetite.   Denies suicidal ideation or homicidal ideation.  Denies auditory hallucinations or visual hallucinations.  Denies any pain.  Denies any problems.    Allergies:  Review of Patient's Allergies indicates:   Bee venom               Anaphylaxis   Carbamazepine           Hives   Methylprednisolone      Dizziness, Drowsiness    Comment:Also believes syncope. Seems to tolerate             inhalational and epidural steroid.   Nutritional supplem*       Hydroxyzine             Nausea  Only    Scheduled Meds:  Current Facility-Administered Medications   Medication Dose Route Frequency Last Admin    azithromycin (ZITHROMAX) tablet 500 mg  500 mg Oral Once per day on Mon Wed Fri 500 mg at 01/03/22 0905    clotrimazole (LOTRIMIN AF) 1 % cream   Topical BID Given at 01/02/22 2023    melatonin tablet 3 mg  3 mg Oral Nightly 3 mg at 01/02/22 2017    QUEtiapine (SEROquel) tablet 50 mg  50 mg Oral Nightly 50 mg at 01/02/22 2016    benzonatate (TESSALON) capsule 100 mg  100 mg Oral TID 100 mg at 01/03/22 1303    metolazone (ZAROXOLYN) tablet 5 mg  5 mg Oral Daily 5 mg at 01/03/22 0900    torsemide (DEMADEX) tablet 120 mg  120 mg Oral TID 120 mg at 01/03/22 1303    atorvastatin (LIPITOR) tablet 80 mg  80 mg Oral Daily 80 mg at 01/03/22 0904    budesonide-formoterol (SYMBICORT) 160-4.5 MCG/ACT inhaler 2 puff  2 puff Inhalation 2 times daily 2  puff at 01/03/22 0824    buprenorphine-naloxone (SUBOXONE) 8-2 MG SL tablet 1 tablet  8 mg Sublingual TID 1 tablet at 01/03/22 1303    gabapentin (NEURONTIN) capsule 600 mg  600 mg Oral TID 600 mg at 01/03/22 1302    levETIRAcetam (KEPPRA) tablet 750 mg  750 mg Oral BID 750 mg at 01/03/22 0900    levothyroxine (SYNTHROID) tablet 25 mcg  25 mcg Oral DAILY 25 mcg at 01/03/22 0516    loratadine (CLARITIN) tablet 10 mg  10 mg Oral Daily 10 mg at 01/03/22 0900    loxapine (LOXITANE) capsule 25 mg  25 mg Oral BID 25 mg at 01/03/22 0900    melatonin tablet 3 mg  3 mg Oral Nightly 3 mg at 01/02/22 2245    methocarbamol (ROBAXIN) tablet 750 mg  750 mg Oral TID 750 mg at 01/03/22 1302    naproxen (NAPROSYN) tablet 500 mg  500 mg Oral BID WC 500 mg at 01/03/22 0904    nicotine (NICODERM CQ) 21 MG/24HR 1 patch  1 patch Transdermal Daily 1 patch at 01/03/22 0900    OXcarbazepine (TRILEPTAL) tablet 150 mg  150 mg Oral BID 150 mg at 01/03/22 0900    pantoprazole (PROTONIX) EC tablet 40 mg  40 mg Oral Daily 40 mg at 01/03/22 0905    prazosin (MINIPRESS) capsule 3 mg  3 mg Oral Nightly 3 mg at 01/02/22 2016    sennosides (SENOKOT) tablet 8.6 mg  8.6 mg Oral Daily 8.6 mg at 01/03/22 0900    thiamine (VITAMIN B-1) tablet 100 mg  100 mg Oral Daily 100 mg at 01/03/22 0900    aspirin chewable tablet 81 mg  81 mg Oral Daily 81 mg at 01/03/22 0900    FLUoxetine (PROzac) capsule 20 mg  20 mg Oral Daily 20 mg at 01/03/22 0900    lidocaine (SALONPAS) 4 % patch 1 patch  1 patch Topical Q24H 1 patch at 01/03/22 0900         PRN Meds:  Current Facility-Administered Medications   Medication Dose Route Frequency Last Admin    fluticasone  1 spray Each Nostril BID PRN 1 spray at 01/03/22 0919    polyethylene glycol  17 g Oral Daily PRN 17 g at 01/03/22 0525    nicotine polacrilex  4 mg Oral Q2H PRN 4 mg at 01/03/22 1303    melatonin  3 mg Oral  QHS PRN, MR X 1 3 mg at 12/28/21 0141    benzocaine-menthol  1 lozenge Buccal Q2H  PRN      nystatin   Topical BID PRN Given at 12/24/21 1645    acetaminophen  650 mg Oral Q6H PRN 650 mg at 01/01/22 0224    albuterol HFA  2 puff Inhalation Q6H PRN 2 puff at 01/01/22 0226    diclofenac  2 g Topical TID PRN 2 g at 01/03/22 0930    docusate sodium  100 mg Oral Q12H PRN 100 mg at 01/03/22 0524    ondansetron  4 mg Oral Q8H PRN 4 mg at 01/01/22 0421    magnesium hydroxide  30 mL Oral Daily PRN 30 mL at 01/02/22 2023    aluminum-magnesium hydroxide-simethicone  30 mL Oral Q2H PRN 30 mL at 12/25/21 1106         VITALS:  Patient Vitals for the past 24 hrs:   BP Temp Temp src Pulse Resp SpO2   01/03/22 1604 (!) 157/99 98.1 F (36.7 C) Oral 87 18 93 %   01/03/22 1303 -- -- -- -- 18 --   01/03/22 1000 -- -- -- 90 -- --   01/03/22 0905 -- -- -- -- 18 --   01/03/22 0900 131/83 97 F (36.1 C) -- 101 18 96 %   01/03/22 0807 143/85 96.5 F (35.8 C) -- 93 18 96 %   01/03/22 0046 -- -- -- -- -- 90 %   01/03/22 0021 -- -- -- 91 -- (!) 88 %   01/02/22 2017 -- -- -- -- 20 --   01/02/22 2016 133/73 -- -- -- -- --   01/02/22 1807 -- -- -- -- -- 94 %       LABS:  Admission on 12/22/2021   Component Date Value Ref Range Status    FINGERSTICK GLUCOSE 01/01/2022 165 (H)  74 - 160 mg/dl Final    Comment: If peripheral circulation is impaired, collection of  capillary blood from the approved sample sites is not  advised as the results might not be a true reflection of the  physiological blood glucose level. This may apply in the  following circumstances: severe dehydration as a result of  diabetic ketoacidosis or due to hyperglycemic hyperosmolar  non-ketotic syndrome, hypotension, shock, decompensated  heart failure NYHA Class IV, or peripheral arterial  occlusive disease.       MENTAL STATUS EXAM:   Mental Status Exam   General Appearance: Dressed in hospital gown/johnny  Behavior: Cooperative;Good eye contact  Level of Consciousness: Alert  Orientation Level: Oriented x3  Attention/Concentration:  WNL  Mannerisms/Movements: Abnormal gait  Speech Quality and Rate: WNL  Speech Clarity: Clear  Speech Tone: Normal vocal inflection  Vocabulary/Fund of Knowledge: WNL  Memory: Intact  Thought Process & Associations: Internally preoccupied  Dissociative Symptoms: None  Thought Content: Preoccupations  Delusions: None  Hallucinations: None  Suicidal Thoughts: None  Homicidal Thoughts: None  Mood: Anxious  Affect: Congruent with mood  Judgment: Poor  Insight: Poor    C-SSRS:  Default Flowsheet Data (most recent)     C-SSRS Frequent Screener - 12/24/21 0005        C-SSRS Frequent Screener    Since last contact, have you actually had thoughts about killing yourself ?  Yes     Have you been thinking about how you might do this?  No     Have you had these thoughts and had some intention of acting  on them?  No     Have you started to work out or worked out the details of how to kill yourself? Do you intend to carry out this plan? No     Have you done anything, started to do anything, or prepared to do anything to end your life?  No     C-SSRS Frequent Screener Risk Score Low Risk                 Default Flowsheet Data (most recent)     C-SSRS Risk Assessment - 12/23/21 1344        C-SSRS Risk Assessment    Suicidal and Self-Injurious Behavior (Past 3 Months) Aborted or Self-Interrupted attempt     Suicidal and Self-Injurious Behavior (Lifetime) Actual suicide attempt;Self-injurious behavior without suicidal intent     Suicidal Ideation Check Most Severe in Past Month Wish to be dead     Activating Events (Recent) Current or pending isolation or feeling alone     Treatment History Hopeless or dissatisfied with treatment;Non-compliant with treatment     Protective Factors (Recent) Identifies reasons for living;Supportive social network or family;Belief that suicide is immoral, high spirituality;Responsibility to family or others, living with family;Fear of death or dying due to pain and suffering     Any suicidal,  self-injurious, or aggressive behaviors?  Yes     Describe any suicidal, self-injurious or aggressive behavior (INCLUDE DATES) cutting behavior multiple episodes, last time during teh last month                 Physical/Somatic Complaints  The patient lists: pain, shortness of breath, unsteady gait.          DSM-V    Primary Diagnosis: Major depressive disorder, recurrent episode, severe   ([name])   Secondary Diagnosis: Borderline personality disorder ([name])   General medical conditions: Seizure Disorder, Hyperlipidemia, Diabetes   Mellitus, Type II, Coronary Artery Disease, Chronic Pain, Chronic   Obstructive Pulmonary Disease  Psychosocial and Contextual Factors: Housing problems, Problems related to   social environment, Problems with access to health care services, Problems   with primary support group           Assessment  Anija Reindel is a 51 year old English-speaking woman with longstanding psych history, including chart diagnoses of PTSD, SCAD, BPAD, and PSUD,multiple hospitalizations starting in childhood, onantipsychotic medication since age 31,multiple prior suicide attempts and SIB requiring medical attention,severe head trauma and seizure d/o, and complex medical illness, who lives in a group home,presenting to ED with increasing suicidal ideation,hallucinations,and trauma reexperiencing symptoms initially admitted to medicine because of hypoxia and transferred to geriatric psychiatry because of ongoing suicidality.     Diagnostic uncertainty given overlapinchart diagnoses. Patient identifies her most concerning symptoms are those associated with PTSD,namely nightmares, intrusive memories.Visual hallucinations may be decompensation of SCAD,trauma-related(e.g. flashbacks),major depressive episode with psychotic features, or transient psychosis in the setting of stress as seen in some personality disorders. Memory deficits on exam may be due to severe history of head injury, polypharmacy  and substance use,or vitamin deficiency.    In terms of risk, patient endorsed SI with intent and method(states she will jump in front of a train at the nearest station as soon as she is discharged) on admissionand believed that ending her life will reunite her and have been with her deceased son. She currently denies those thoughts. Her trauma-reexperiencing and psychotic symptoms related to her son are heightening her risk of suicide. Additional risk factors include  history of multiple attempts, recent SIB requiring medical attention, history of head trauma, comorbid substance use, preparatory behaviors/attempt in ED,impulsivity,perceived lack of social support, and chronic pain.There are a few mitigating risk factors and patient at  is  agreeable to safety planning. She feels much better ad is ready to discuss dc options and placement.     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 fluoxetine 20 mg po daily    Anxiety:  Cont. Lorazepam 0.5 mg po daily prn (lowered from 1 mg twice a day), plan to discontinue    #Dyspnea on exertion  #Peripheral edema  #HFpEF (EF 70%, no WMA on TTE 12/07/21)  #OHS, OSA  #COPD/asthma  -feels dyspnea, peripheral edema worse since recent hospital discharge, missing about ~ 1d of medication  -possibly w/ mild CHF exacerbation from missed diuretic doses  -no e/o infection or copd exacerbation  -increase torsemide to 120 bid (similar to recent admission, requiring an increased dose briefly) (day team to follow-up daily to adjust dose prn. Had been discharged on torsemide 100 bid)  -I/Os, daily weights, vitals  -SpO2 goal 88-92, if SpO2 >90, does not need supplemental O2 (may need it only overnight 1-2L)  -admitted on ~2 L supplemental O2 w/ SpO2 >92 (baseline is closer to ~90 on chart review); on review of current presentation, lowest O2 on flow sheet was 91 on room air  -c/w ICS/LABA, SABA prn  -f/u nt-pro-bnp and repeat basic  labs    #burning sensation in lungs/right back  -suspect due to recent cocaine use (onset after hospital d/c, cocaine use)  -no concerning skin changes on back  -no concerning urinary sxs, f/u ua  -trial of gi cocktail and lidocaine patch    #complex chronic pain syndrome  #lumbar spinal stenosis w/ sciatica  #L knee pain, improved  #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  -had mechanical fall while on recent psych admission resulting in increased L knee pain and difficulty ambulating, requiring transfer to medicine  -evaluated by ortho who felt MRI L knee findings appeared chronic, seen by PT who recommended skilled PT and STR  -c/w tylenol, naproxen, suboxone, gabapentin, robaxin    #PSUD  -recovery coach consult  -uds + for marijuana, cocaine, suboxone  -c/w suboxone tid (takes also for chronic pain)  -cessation, NRT    #Seizure d/o  -c/w keppra, trileptal    #HLD  -c/w statin    #h/o HCV  -recent viral load undetectable, cleared on her own      #housing instability  -social work consult    Diet-cardiac  15 minutes checks  DVT prophylaxis-sch  Med rec- completew/ recent d/c med list  PCP-unclear; possiblyJean Maisie Fus, FNP  HCP-not on file  Code- full       Review with patient: Treatment plan reviewed with the patient.

## 2022-01-03 NOTE — Progress Notes (Signed)
Patient  has been intermittently visible in the milieu, anxious, labile mood, irritable at times. Patient c/o increase in anxiety in the morning, stated feeling better after she took her morning medications.Took ?medications as ordered including prn nicorette gums for craving,?prn Voltaren cream to lower back for discomfort, prn Flonase for seasonal allergy with relief. O2 sat 96% on?2L, no sob reported.?She ate 50% and ?75%?of?her meals.?Attended morning group. No ?behavioral issues, no safety concerns, denies SI/HI/AH/VH.?Received dulcolax suppository x1 for no result from MOM and colace, result pending. Assisted as needed.  BP 131/83   Pulse 90   Temp 97 ?F (36.1 ?C)   Resp 18   Ht 5\' 9"  (1.753 m)   Wt (!) 152.1 kg (335 lb 6.4 oz)   SpO2 96%   BMI 49.53 kg/m?

## 2022-01-04 ENCOUNTER — Telehealth (HOSPITAL_BASED_OUTPATIENT_CLINIC_OR_DEPARTMENT_OTHER): Payer: Self-pay

## 2022-01-04 MED ORDER — NICOTINE POLACRILEX 2 MG MT GUM
4.0000 mg | CHEWING_GUM | OROMUCOSAL | Status: DC | PRN
Start: 2022-01-04 — End: 2022-01-06
  Administered 2022-01-04 – 2022-01-06 (×6): 4 mg via ORAL
  Filled 2022-01-04 (×6): qty 2

## 2022-01-04 MED ORDER — OTHER MEDICATION
0 refills | Status: DC
Start: 2022-01-04 — End: 2022-01-05

## 2022-01-04 MED ORDER — LORAZEPAM 0.5 MG PO TABS
0.50 mg | ORAL_TABLET | Freq: Once | ORAL | Status: AC
Start: 2022-01-04 — End: 2022-01-04
  Administered 2022-01-04: 0.5 mg via ORAL
  Filled 2022-01-04: qty 1

## 2022-01-04 NOTE — Medicare Certification/Re-Certification (Signed)
Day 12 Recertification due, I certify that the inpatient psychiatric facility admission was medically necessary for either:    treatment which could reasonable be expected to improve the patient's condition    I estimate 18 DAYS of hospitalization is necessary for proper treatment of the patient.  My plans for post-hospital care for this patient are:   Shelter placement

## 2022-01-04 NOTE — Group Note (Signed)
OT Psych Group Documentation    Topics: Breakfast Bites      Occupations Addressed: Activities of Daily living, Social participation, Instrumental activities of daily living (health management & maintenance)     Skills addressed: Fine/gross motor skills, process skills, social interaction skills, behavioral control, mental functions (cognitive, perceptual, affective)           Date of group: 01/04/2022  Start Time: 0800  End Time: 0845      Attendance: Attended entire group  Participation/Patient Response: Attentive/able to focus, Contributed to discussion, Interactive with others, Mood/affect congruent with topic, Observed/listened and Supportive to others  Participation within a task: Demonstrated ability to focus, Demonstrated ability to make decisions, Demonstrated positive frustration tolerance, Organized task skills, Positive attention to detail, Responsive to cues/limits/redirection from staff and Social with peers  Treatment Goals Addressed: Development/maintenance of a health lifestyle and Diagnosis/illness/symptom management    Comments: Cheryl Zimmerman was calm and pleasant this morning. Attentive to meal, completed menu with minA to orient, assist for problem solving. Social with peers, appropriate behavioral control.      Cheryl Zimmerman, [place]

## 2022-01-04 NOTE — Event Note (Signed)
Capacity evaluation for leaving the hospital against medical advice.     Approached patient to discuss her decision to leave the hospital AMA. She has complex medical hx including obesity, COPD, OSA with a new requirement for O2 overnight due to desaturations (low 80s). Had rapid response for episode of unresponsiveness on 4/15 where O2 sat was 83, returned to 93 on 4L NC. Medicine is recommended portable oxygen concentrator for discharge which is not yet available. On interview, she is A/O x4. She is calm and cooperative during the interview, intermittently animated when discussing reasons for leaving the hospital.     1. Communicates a choice: She tells me that she wants to leave the hospital. "Being in the hospital is making me crazy." States that she doesn't like being on a geripsych floor and that she is "not at the same level" as the other patients here. States she wants fresh air and that she hasn't been sleeping well due to the noise on the unit. She has been consistently expressing desire to leave the hospital to staff and treatment team.    2. Understand the relevant information: She tells me that she has been diagnosed with both COPD and OSA. She takes some medications for these conditions. She tells me that recently she has been using oxygen at night but has not consistently used it during the day. Her understanding is that her O2 sat should be above 90% and that if it is below that level, she should use supplemental oxygen. Using oxygen at night helps her have better sleep and breath easier. She knows that the team is working on finding placement for her and portable oxygen but that they have not yet secured either at this point. She states she is willing to buy an oximeter from drug store upon discharge and that is feels SOB, she would call 911 or go to the ED.    3. Appreciate the situation and its consequences: She lists possible consequences of not using oxygen as having low oxygen, feeling SOB,  dizzy and passing out.     4. Reason about treatment options: She reports an understanding of her medical condition and treatment options and continues to report desire to leave the hospital despite aftercare not being set up at this point (respite bed, portable O2 concentrator). She is hoping to live in a shelter for several months. She plans to visit PCP upon discharge to continue to work on obtaining the DME O2.     Given this information, I believe the patient has capacity at this point to leave the hospital against medical advice (without O2 concentrator or respite bed in place).    Melvyn Novas, DO, 01/04/2022

## 2022-01-04 NOTE — Medicare Certification/Re-Certification (Signed)
Initial Certification due,  I certify that the inpatient psychiatric facility admission was medically necessary for either:    treatment which could reasonable be expected to improve the patient's condition    I estimate 12 DAYS of hospitalization is necessary for proper treatment of the patient.  My plans for post-hospital care for this patient are:   Shelter placement

## 2022-01-04 NOTE — Telephone Encounter (Signed)
-----   Message from [name], MD sent at 12/30/2021 12:17 PM EDT -----  Regarding: Reschedule  Hi- could this pt please be rescheduled as she is admitted to [facility]. Thank you    -Melanee Spry, on behalf of Renae Fickle

## 2022-01-04 NOTE — Progress Notes (Signed)
But different aspects of life education and work Cheryl Zimmerman has a lot to say     PSYCH PROGRESS NOTE    Identifying Data:  Cheryl Zimmerman is a 51 year old English-speaking woman with longstanding psych history, including chart diagnoses of PTSD, SCAD, BPAD, and PSUD,multiple hospitalizations starting in childhood, onantipsychotic medication since age 65,multiple prior suicide attempts and SIB requiring medical attention,severe head trauma and seizure d/o, and complex medical illness, including in the ED where she attempted to strangle herself with EKG wires, (04/2021),  PMH notable for chronic systolic congestive heart failure, seizure disorder,on Keppra,pain syndrome, COPD, Bronchial asthma, morbid obesity, who until recently lived in a group home, but left voluntarilyadmittedwith suicidal ideation with a plan to jump in front of the train or overdose on pills that was transferred from [name] because of complaints of suicidality..      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  From the nursing report: The patient is inconsistent with oxygen, constantly taking it off and resisting putting it back.  Labile, with irritable age, anxious.  She took prn off on thyroid cream, Flonase, Nicorette gum.  Had a KUB that showed significant amount of stool, had an enema and 3 bowel movement after that.  Does not participate in groups, compliant with medications, has good appetite.  Mostly sits in the TV room, has interrupted sleep because of the roommate and is constantly complaining about that.  Good ADLs.  Last night slept 3.8 hours.  On April 15 the patient had a rapid response during the night when she would not respond to sternal rub and desaturated to the oxygen level of 83.  Was put back on oxygen with good result.    Patient was seen during the morning rounds together with the social worker.  Presented sitting on a chair in the TV room, alone, without oxygen, calm and smiling.  Easily engaged in conversation, has good ADLs, good eye contact.  Denied suicidal and homicidal thoughts.  The patient started conversation with suffering from poor sleep because of the roommate and wanting to be discharged.  The patient appreciates all referrals made by the social worker but states that she does not want to wait anymore and would rather go to  the shelter and then stay here an extra day.  The writer attempted to discuss again risks of not having supplemental oxygen and no access to oxygenator because of difficulty with getting it approved and available.  The patient understands the information and states that she will call 911 if she has the first symptoms of feeling dizzy or weak.  The only option for her would be discharged to a shelter where she will be staying from 7 PM to 7 AM in case she gets in which is not guaranteed.  The patient has some friends with whom she can stay for a day or 2 but cannot stay for longer.  And who also or active substance  abusers.  She is very reluctant to go this route as she is concerned for relapse.  The team had a meeting with risk management, hospital attorney and unit medical director to discuss the situation and discharge options.  The patient has capacity to make medical decisions, she verbalized repeatedly that her desire to leave though she appreciates the risks that this involve.  We will consider her verbal request has verbal 3-day notice which the patient repeatedly refuses to sign.  The hospitalist and the patient's primary care are working on finding an oxygenator for the patient.  The hospitalist has been doing it for over a week now and so far there is no available equipment for the patient.  The concern is that the patient will desaturate and will need another admission within a day without oxygen.  The suggestion of risk management was risk evaluation from [facility] as the patient is their client.it was communicated to [facility] and they promised to do the the risk evaluation.  The social worker made multiple referrals to rehabs, substance abuse programs, medical respites, [facility] respite, housing and placement options with [facility] and there is no available placement for the patient at the moment unfortunately.      The writer requested independent capacity evaluation for the patient and that will be performed today.  Allergies:  Review of Patient's Allergies indicates:   Bee venom               Anaphylaxis   Carbamazepine           Hives   Methylprednisolone      Dizziness, Drowsiness    Comment:Also believes syncope. Seems to tolerate             inhalational and epidural steroid.   Nutritional supplem*       Hydroxyzine             Nausea Only    Scheduled Meds:  Current Facility-Administered Medications   Medication Dose Route Frequency Last Admin    LORazepam (ATIVAN) tablet 0.5 mg  0.5 mg Oral Once      azithromycin (ZITHROMAX) tablet 500 mg  500 mg Oral Once per day on Mon Wed Fri 500 mg at 01/03/22 0905    clotrimazole  (LOTRIMIN AF) 1 % cream   Topical BID Given at 01/03/22 2014    melatonin tablet 3 mg  3 mg Oral Nightly 3 mg at 01/03/22 2014    QUEtiapine (SEROquel) tablet 50 mg  50 mg Oral Nightly 50 mg at 01/03/22 2013    benzonatate (TESSALON) capsule 100 mg  100 mg Oral TID 100 mg at 01/04/22 0943    metolazone (ZAROXOLYN) tablet 5 mg  5 mg Oral Daily 5 mg at 01/04/22 0932    torsemide (DEMADEX) tablet 120 mg  120 mg Oral TID 120 mg at 01/04/22 1005    atorvastatin (LIPITOR) tablet 80 mg  80 mg Oral Daily 80 mg at 01/04/22 0938    budesonide-formoterol (SYMBICORT) 160-4.5 MCG/ACT inhaler 2 puff  2 puff Inhalation 2 times daily 2 puff at 01/04/22 0943    buprenorphine-naloxone (SUBOXONE) 8-2 MG SL tablet 1 tablet  8 mg Sublingual TID 1 tablet at 01/04/22 0941    gabapentin (NEURONTIN) capsule 600 mg  600 mg Oral TID 600 mg at 01/04/22 0942    levETIRAcetam (KEPPRA) tablet 750 mg  750 mg Oral BID 750 mg at 01/04/22 0939    levothyroxine (SYNTHROID) tablet 25 mcg  25 mcg Oral DAILY 25 mcg at 01/04/22 0531    loratadine (CLARITIN) tablet 10 mg  10 mg Oral Daily 10 mg at 01/04/22 0943    loxapine (LOXITANE) capsule 25 mg  25 mg Oral BID 25 mg at 01/04/22 0932    melatonin tablet 3 mg  3 mg Oral Nightly 3 mg at 01/03/22 2013    methocarbamol (ROBAXIN) tablet 750 mg  750 mg Oral TID 750 mg at 01/04/22 0940    naproxen (NAPROSYN) tablet 500 mg  500 mg Oral BID WC 500 mg at 01/04/22 0932    nicotine (NICODERM CQ) 21 MG/24HR 1 patch  1 patch Transdermal Daily 1 patch at 01/04/22 0950    OXcarbazepine (TRILEPTAL) tablet 150 mg  150 mg Oral BID 150 mg at 01/04/22 0939    pantoprazole (PROTONIX) EC tablet 40 mg  40 mg Oral Daily 40 mg at 01/04/22 0942    prazosin (MINIPRESS) capsule 3 mg  3 mg Oral Nightly 3 mg at 01/03/22 2014    sennosides (SENOKOT) tablet 8.6 mg  8.6 mg Oral Daily 8.6 mg at 01/04/22 0941    thiamine (VITAMIN B-1) tablet 100 mg  100 mg Oral Daily 100 mg at 01/04/22 2956    aspirin chewable tablet  81 mg  81 mg Oral Daily 81 mg at 01/04/22 0938    FLUoxetine (PROzac) capsule 20 mg  20 mg Oral Daily 20 mg at 01/04/22 0943    lidocaine (SALONPAS) 4 % patch 1 patch  1 patch Topical Q24H 1 patch at 01/04/22 0945         PRN Meds:  Current Facility-Administered Medications   Medication Dose Route Frequency Last Admin    fluticasone  1 spray Each Nostril BID PRN 1 spray at 01/03/22 0919    polyethylene glycol  17 g Oral Daily PRN 17 g at 01/03/22 0525    melatonin  3 mg Oral QHS PRN, MR X 1 3 mg at 12/28/21 0141    benzocaine-menthol  1 lozenge Buccal Q2H PRN      nystatin   Topical BID PRN Given at 12/24/21 1645    acetaminophen  650 mg Oral Q6H PRN 650 mg at 01/01/22 0224    albuterol HFA  2 puff Inhalation Q6H PRN 2 puff at 01/01/22 0226    diclofenac  2 g Topical TID PRN 2 g at 01/04/22 0947    docusate sodium  100 mg Oral Q12H PRN 100 mg at 01/03/22 2052    ondansetron  4 mg Oral Q8H PRN 4 mg at 01/01/22 0421    magnesium hydroxide  30 mL Oral Daily PRN 30 mL at 01/02/22 2023    aluminum-magnesium hydroxide-simethicone  30 mL Oral Q2H PRN 30 mL at 12/25/21 1106         VITALS:  Patient  Vitals for the past 24 hrs:   BP Temp Temp src Pulse Resp SpO2   01/04/22 0941 -- -- -- -- 18 --   01/04/22 0932 132/89 -- -- -- -- --   01/04/22 0733 132/89 97.8 F (36.6 C) TEMPORAL 89 18 95 %   01/04/22 0500 -- -- -- -- -- 92 %   01/04/22 0430 -- -- -- -- -- 92 %   01/03/22 2014 125/73 -- -- -- -- --   01/03/22 2013 -- -- -- -- 18 --   01/03/22 1715 136/87 -- -- 82 -- --   01/03/22 1604 (!) 157/99 98.1 F (36.7 C) Oral 87 18 93 %       MENTAL STATUS EXAM:   Mental Status Exam   General Appearance: Dressed appropriately  Behavior: Cooperative;Good eye contact  Level of Consciousness: Alert  Orientation Level: Grossly Intact  Attention/Concentration: WNL  Mannerisms/Movements: Involuntary movements (hands tremors)  Speech Quality and Rate: WNL  Speech Clarity: Clear  Speech Tone: Normal vocal  inflection  Vocabulary/Fund of Knowledge: WNL  Memory: Grossly intact  Thought Process & Associations: Internally preoccupied  Dissociative Symptoms: None  Thought Content: Preoccupations  Delusions: None  Hallucinations: None  Suicidal Thoughts: None  Homicidal Thoughts: None  Mood: Anxious  Affect: Congruent with mood  Judgment: Poor  Insight: Poor    C-SSRS:  Default Flowsheet Data (most recent)     C-SSRS Frequent Screener - 12/24/21 0005        C-SSRS Frequent Screener    Since last contact, have you actually had thoughts about killing yourself ?  Yes     Have you been thinking about how you might do this?  No     Have you had these thoughts and had some intention of acting on them?  No     Have you started to work out or worked out the details of how to kill yourself? Do you intend to carry out this plan? No     Have you done anything, started to do anything, or prepared to do anything to end your life?  No     C-SSRS Frequent Screener Risk Score Low Risk                 Default Flowsheet Data (most recent)     C-SSRS Risk Assessment - 12/23/21 1344        C-SSRS Risk Assessment    Suicidal and Self-Injurious Behavior (Past 3 Months) Aborted or Self-Interrupted attempt     Suicidal and Self-Injurious Behavior (Lifetime) Actual suicide attempt;Self-injurious behavior without suicidal intent     Suicidal Ideation Check Most Severe in Past Month Wish to be dead     Activating Events (Recent) Current or pending isolation or feeling alone     Treatment History Hopeless or dissatisfied with treatment;Non-compliant with treatment     Protective Factors (Recent) Identifies reasons for living;Supportive social network or family;Belief that suicide is immoral, high spirituality;Responsibility to family or others, living with family;Fear of death or dying due to pain and suffering     Any suicidal, self-injurious, or aggressive behaviors?  Yes     Describe any suicidal, self-injurious or aggressive behavior (INCLUDE  DATES) cutting behavior multiple episodes, last time during teh last month                 Physical/Somatic Complaints  The patient lists: pain, shortness of breath, unsteady gait.          DSM-V  Primary Diagnosis: Major depressive disorder, recurrent episode, severe   ([name])   Secondary Diagnosis: Borderline personality disorder ([name])   General medical conditions: Seizure Disorder, Hyperlipidemia, Diabetes   Mellitus, Type II, Coronary Artery Disease, Chronic Pain, Chronic   Obstructive Pulmonary Disease  Psychosocial and Contextual Factors: Housing problems, Problems related to   social environment, Problems with access to health care services, Problems   with primary support group           Assessment  Cheryl Zimmerman is a 51 year old English-speaking woman with longstanding psych history, including chart diagnoses of PTSD, SCAD, BPAD, and PSUD,multiple hospitalizations starting in childhood, onantipsychotic medication since age 16,multiple prior suicide attempts and SIB requiring medical attention,severe head trauma and seizure d/o, and complex medical illness, who lives in a group home,presenting to ED with increasing suicidal ideation,hallucinations,and trauma reexperiencing symptoms initially admitted to medicine because of hypoxia and transferred to geriatric psychiatry because of ongoing suicidality.     Diagnostic uncertainty given overlapinchart diagnoses. Patient identifies her most concerning symptoms are those associated with PTSD,namely nightmares, intrusive memories.Visual hallucinations may be decompensation of SCAD,trauma-related(e.g. flashbacks),major depressive episode with psychotic features, or transient psychosis in the setting of stress as seen in some personality disorders. Memory deficits on exam may be due to severe history of head injury, polypharmacy and substance use,or vitamin deficiency.    In terms of risk, patient endorsed SI with intent and method(states she  will jump in front of a train at the nearest station as soon as she is discharged) on admissionand believed that ending her life will reunite her and have been with her deceased son. She currently denies those thoughts. Her trauma-reexperiencing and psychotic symptoms related to her son are heightening her risk of suicide. Additional risk factors include history of multiple attempts, recent SIB requiring medical attention, history of head trauma, comorbid substance use, preparatory behaviors/attempt in ED,impulsivity,perceived lack of social support, and chronic pain.There are a few mitigating risk factors and patient at  is  agreeable to safety planning. She feels much better ad is ready to discuss dc options and placement.     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 prozac 20 mg po daily    Anxiety:  Attempted to stop lorazepam completely but unfortunately the patient complains of anxiety and the positive bowel side effects of antihistamine medications and antipsychotics are very concerning for her.  One-time dose of Ativan 0.5 mg was given.    #Dyspnea on exertion  #Peripheral edema  #HFpEF (EF 70%, no WMA on TTE 12/07/21)  #OHS, OSA  #COPD/asthma  -feels dyspnea, peripheral edema worse since recent hospital discharge, missing about ~ 1d of medication  -possibly w/ mild CHF exacerbation from missed diuretic doses  -no e/o infection or copd exacerbation  -increase torsemide to 120 bid (similar to recent admission, requiring an increased dose briefly) (day team to follow-up daily to adjust dose prn. Had been discharged on torsemide 100 bid)  -I/Os, daily weights, vitals  -SpO2 goal 88-92, if SpO2 >90, does not need supplemental O2 (may need it only overnight 1-2L)  -admitted on ~2 L supplemental O2 w/ SpO2 >92 (baseline is closer to ~90 on chart review); on review of current presentation, lowest O2 on flow sheet was 91 on room air  -c/w ICS/LABA, SABA prn  -f/u  nt-pro-bnp and repeat basic labs    #burning sensation in lungs/right back  -suspect due to recent cocaine use (onset  after hospital d/c, cocaine use)  -no concerning skin changes on back  -no concerning urinary sxs, f/u ua  -trial of gi cocktail and lidocaine patch    #complex chronic pain syndrome  #lumbar spinal stenosis w/ sciatica  #L knee pain, improved  #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  -had mechanical fall while on recent psych admission resulting in increased L knee pain and difficulty ambulating, requiring transfer to medicine  -evaluated by ortho who felt MRI L knee findings appeared chronic, seen by PT who recommended skilled PT and STR  -c/w tylenol, naproxen, suboxone, gabapentin, robaxin    #PSUD  -recovery coach consult  -uds + for marijuana, cocaine, suboxone  -c/w suboxone tid (takes also for chronic pain)  -cessation, NRT    #Seizure d/o  -c/w keppra, trileptal    #HLD  -c/w statin    #h/o HCV  -recent viral load undetectable, cleared on her own      #housing instability  -social work consult    Diet-cardiac  15 minutes checks  DVT prophylaxis-sch  Med rec- completew/ recent d/c med list  PCP-unclear; possiblyJean Maisie Fus, FNP  HCP-not on file  Code- full       Review with patient: Treatment plan reviewed with the patient.

## 2022-01-04 NOTE — Plan of Care (Signed)
Problem: Mood-related symptoms  Description: Pt. Is a 51 year old female with chronic mental illness who recently left group home and now homeless. PMH: obesity, COPD, CHF, spinal stenosis. Diagnosis MDD with SI. Pt. Reports no SI on admission.   Goal: Reduction in Intensity and Frequency (Mood)  Description: Pt. Will seek out staff if having any Suicidal ideation on day one of admission. Pt. Will be medication adherent on day one of admission. Pt. Will attend scheduled groups daily on day two of admission.  Outcome: Progressing     Pt alert, and oriented x 2 to 3, visible in the milieu, showered, attended early OT group, watched TV in the group room. Labile mood, anxious, weepy at times, pt stated she wants "to get out of here, "/ wants discharged", MD aware. Pt compliant with meds. Pt requested, and medicated with PRN Nicotine gum x 2 for nicotine cravings, one time dose of Ativan for anxiety both with good effect. Pt non compliant with continuous oxygen order, O2 sat on RA 92-95%, no respiratory distress noted or reported. Pt ate 40% bkfst, and 100% lunch, fluid intake adequate, last BM overnight post enema. No Covid SX noted or reported. Pt denies S/HI/AH/VH. Safety checks, and rounding protocol maintained.   BP 132/89   Pulse 89   Temp 97.8 ?F (36.6 ?C) (Temporal)   Resp 18   Ht 5\' 9"  (1.753 m)   Wt (!) 152.1 kg (335 lb 6.4 oz)   SpO2 95%   BMI 49.53 kg/m?

## 2022-01-04 NOTE — Group Note (Signed)
OT Psych Group Documentation    Feel Good  Topics: Feel Good      Occupations Addressed: Activities of daily living (functional mobility), Instrumental activities of daily living (health management & maintenance), Leisure, Social participation    Skills addressed: Fine/gross motor skills, social interaction skills, process skills, emotional regulation, mental functions       Date of group: 01/04/2022  Start Time: 1015  End Time: 1100    Comments: in large group room, waiting for help with a shower, declined invite    Gwenette Greet, OT [phone]
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
## 2022-01-04 NOTE — Plan of Care (Signed)
Patient received in her room resting with eyes closed at change of shift in no apparent distress, no SOB noted or reported, no Covid SX, continues on oxygen therapy tolerated well Sat at 96% on 2 L via NC, visible in open area sitting in the dining room in good behavior control, voiced no complaints no safety concerns, LBM 01/03/2022, slept 3.8 hr, patient stable will continue with plan of care.

## 2022-01-04 NOTE — Progress Notes (Signed)
Saw pt and discussed her desire to leave AMA. She acknowledged that her O2 has been concerninly low at times, including during the rapid response on 4/15, and that leaving before she has a portable oxygen concentrator might result in her passing out and being injured. She essentially reiterated the same reasons for wanting to leave that she told Dr. Ronie Spies and Dr. Melbourne Abts earlier. She said that she intends to follow up closely with her PCP to manage her chronic medical issues and said she will call 911 if she feels very short of breath or like she might fall. She was calm and well organized throughout the conversation. She asked that I advocate for her to be discharged tomorrow AM.    .  SIGNED Georgianne Fick, CNP, 01/04/2022    Nurse Practitioner   Hospitalist Division  [facility]   Units [place] 2 and [place] 2     Phone: [phone] (fastest) or [phone]     Pager: [phone]    Email: [email or web address]

## 2022-01-04 NOTE — Telephone Encounter (Signed)
Cheryl Fusi, MD  [facility] Front Desk Pool  Hi- could this pt please be rescheduled as she is admitted to [facility]. Thank you     -Melanee Spry, on behalf of [facility]     Appointment rescheduled, no further action  Is needed.     Oliver Pila [place], 01/04/2022

## 2022-01-04 NOTE — Group Note (Signed)
OT Psych Group Documentation    Topics: Project    Occupations Addressed: Leisure, Social participation   Skills addressed: Fine/gross IT trainer, process skills, social interaction skills, behavioral control, mental functions       Date of group: 01/04/2022  Start Time: 1100  End Time: 1150    Comments: walking in [employer] after shower, was invited, did not join    [employer], [place]

## 2022-01-04 NOTE — Progress Notes (Addendum)
Visible in the milieu. Alert, social w/ staff and peers. She watched movies on a Tablet in her room. No SOB reported. Continued on O2 at 2 L NC, but she remained noncompliant w/ the O2. She ate supper at her bedside, 100%. Positive medications. Had requested Nicorette Gum 4 mg PRN for Nicotine Craving. Independent w/ ADLs. Last BMs 01/04/22. No SI/HI, no VH/AH. No safety concerns. Pt C/O Nausea had received Zofran-ODT 4 mg PRN around 2240 w/ some effects. We will continue to monitor. BP 121/69   Pulse 85   Temp 95.8 ?F (35.4 ?C) (Temporal)   Resp 18   Ht 5\' 9"  (1.753 m)   Wt (!) 152.1 kg (335 lb 6.4 oz)   SpO2 93%   BMI 49.53 kg/m?

## 2022-01-05 MED ORDER — QUETIAPINE FUMARATE 100 MG PO TABS
100.00 mg | ORAL_TABLET | Freq: Once | ORAL | Status: AC
Start: 2022-01-05 — End: 2022-01-05
  Administered 2022-01-05: 100 mg via ORAL
  Filled 2022-01-05: qty 1

## 2022-01-05 MED ORDER — OTHER MEDICATION
0 refills | Status: DC
Start: 2022-01-05 — End: 2022-04-12

## 2022-01-05 NOTE — Group Note (Signed)
OT Psych Group Documentation    Topic: Breakfast Bites     Occupations Addressed: Activities of Daily living, Social participation, Instrumental activities of daily living (health management & maintenance)     Skills addressed: Fine/gross motor skills, process skills, social interaction skills, behavioral control, mental functions (cognitive, perceptual, affective)           Date of group: 01/05/2022  Start Time: 0800  End Time: 0845    Attendance: Attended entire group  Participation/Patient Response: Attentive/able to focus, Contributed to discussion, Interactive with others, Mood/affect congruent with topic, Observed/listened and Supportive to others  Participation within a task: Demonstrated ability to focus, Demonstrated ability to make decisions, Demonstrated positive frustration tolerance, Organized task skills, Positive attention to detail, Responsive to cues/limits/redirection from staff and Social with peers  Treatment Goals Addressed: Development/maintenance of a health lifestyle and Diagnosis/illness/symptom management  ?  Comments: Cheryl Zimmerman was calm and pleasant this morning. Attentive to meal, completed menu with minA to orient, assist for problem solving. Social with peers, appropriate behavioral control.    Darcel Bayley, OT [phone]

## 2022-01-05 NOTE — RN Shift Note (Signed)
Baruch Goldmann receives 3 bras from [employer] : white, black, pink

## 2022-01-05 NOTE — Plan of Care (Signed)
Patient received in her room resting with eyes closed at change of shift in no apparent distress, no SOB noted or reported, no Covid SX, continues on oxygen therapy, patient had been non-compliant needed frequent redirection and teaching, 1:1 emotional nursing support provided, tolerated well Sat at 95% on 2 L via NC, P 86, visible in open area sitting in the dining room in good behavior control dozing on and off, PRN nicorette gun giving at 0535 along with synthroid, voiced no complaints no safety concerns, LBM 01/04/2022, slept 7 hrs, patient stable will continue with plan of care.

## 2022-01-05 NOTE — Plan of Care (Signed)
Problem: Psychosis Symptoms  Goal: Reduction in Intensity and Frequency (Psychosis)  Outcome: Progressing     Pt alert, and oriented x 2 to 3, independent with ADLs, visible at times, attended early AM group, watched movies on tablet in room most shift. Pt  irritable, anxious, weepy, swears at times, voiced concerns about "not wanting to be here surrounded by crazy people, she is not crazy", teaching about reason of delay in discharge (oxygen order) conducted, pt medicated with PRN Seroquel x 1 for anxiety with good effect, pt compliant with all meds, pt requested, and medicated with PRN Nicotine gum x 1 for nicotine craving with good effect. Denies SI/HI/AH/VH. Pt continues to be non compliant with continuous oxygen order, O2 sat on RA 93-94%, no respiratory distress noted or reported. Pt ate 60% bkfst, and 100% lunch, fluid intake adequate, last BM 01/04/22. No Covid SX noted or reported. Safety checks, and rounding protocol maintained.  BP 130/79   Pulse 82   Temp 97.8 ?F (36.6 ?C) (Temporal)   Resp 18   Ht 5\' 9"  (1.753 m)   Wt (!) 152.1 kg (335 lb 6.4 oz)   SpO2 93%   BMI 49.53 kg/m?

## 2022-01-05 NOTE — Progress Notes (Signed)
Visible in the Quiet Group Room napping off and on while watching TV. No SOB, no repiratory distress reported. Continued on O2 at 2 L via NC. Pt remained noncompliant w/ the O2. She ate supper at her bedside, 100%. Took all her medications as ordered. Had Nicorette Gum 4 mg PRN for Nicotine Craving. Independent w/ ADLs. Last BMs 01/04/22. No SI/HI, no VH/AH. No safety concerns. We will continue to monitor. BP 122/70   Pulse 81   Temp 97.3 ?F (36.3 ?C) (Temporal)   Resp 18   Ht 5\' 9"  (1.753 m)   Wt (!) 152.1 kg (335 lb 6.4 oz)   SpO2 95%   BMI 49.53 kg/m?

## 2022-01-05 NOTE — Group Note (Signed)
OT Psych Group Documentation     Topics: Feel Good      Occupations Addressed: Activities of daily living (functional mobility), Instrumental activities of daily living (health management & maintenance), Leisure, Social participation      Skills addressed: Fine/gross motor skills, social interaction skills, process skills, emotional regulation, mental functions         Date of group: 01/05/2022  Start Time: 1015  End Time: 1100    Attendance: Declined    [place] [id number]

## 2022-01-05 NOTE — Group Note (Signed)
OT Psych Group Documentation     Topics: Project      Occupations Addressed: Leisure, Social participation     Skills addressed: Fine/gross motor skills, process skills, social interaction skills, behavioral control, mental functions         Date of group: 01/05/2022  Start Time: 1100  End Time: 1145    Attendance: [place]ined    [place] [id number]
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
## 2022-01-05 NOTE — Progress Notes (Signed)
But different aspects of life education and work [name] has a lot to say     PSYCH PROGRESS NOTE    Identifying Data:  Cheryl Zimmerman is a 51 year old English-speaking woman with longstanding psych history, including chart diagnoses of PTSD, SCAD, BPAD, and PSUD,multiple hospitalizations starting in childhood, onantipsychotic medication since age 42,multiple prior suicide attempts and SIB requiring medical attention,severe head trauma and seizure d/o, and complex medical illness, including in the ED where she attempted to strangle herself with EKG wires, (04/2021),  PMH notable for chronic systolic congestive heart failure, seizure disorder,on Keppra,pain syndrome, COPD, Bronchial asthma, morbid obesity, who until recently lived in a group home, but left voluntarilyadmittedwith suicidal ideation with a plan to jump in front of the train or overdose on pills that was transferred from [name] because of complaints of suicidality..      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  From the nursing report: The patient continues to require oxygen, mostly at night but she is inconsistent and takes it off repeatedly.  Labile, with irritable age, anxious.  She took multiple prn.  Had a BM after an enema. participated in evening in group and TV group, is mostly alone watching movies on her Ipad.  Patient is compliant with medications, has good appetite. Complained of nausea in the afternoon and took zofran with good effect. Good ADLs.  Last night slept 7 hours.  On April 15 the patient had a rapid response during the night when she would not respond to sternal rub and desaturated to the oxygen level of 83.  Was put back on oxygen with good result.    Patient was seen during the morning rounds together with the social worker.  Presented sitting on a chair in her  room, watching TV. Immediately spoke about dc and was informed that she will be dc to as helter when we have Oxygen equipment for her and requested that the hospital would provide this equipment but accepted explanation that this is not possible. Easily engaged in conversation, has good ADLs, good eye contact.  Denied suicidal and homicidal thoughts.  She understood thye reason of keeping her until the home oxygen system will be available, but is still upset about that.  Asked multiple questions about her [employer] order. Later approached the writer to inform me that her heart is good and she will stop her heart medications. It was communicated to the hospitalist and she will see the patient today.    The SW and the writer made a [hone call to [employer] to request a list of respiratory services and faxed her information to all six possible vendors and are waiting for responses. We also advocated the insurance company for expedited approval of O2 services at home for the patient. The patient appreciates all referrals made by the social worker but states that she does not want to wait anymore and would  rather go to the shelter and then stay here an extra day.  The writer attempted to discuss again risks of not having supplemental oxygen and no access to oxygenator because of difficulty with getting it approved and available.    The patient understands the information and states that she will call 911 if she has the first symptoms of feeling dizzy or weak.  The only option for her would be discharged to a shelter where she will be staying from 7 PM to 7 AM in case she gets in which is not guaranteed.  The patient has some friends with whom she can stay for a day or 2 but cannot stay for longer.  And who also or active substance abusers.  She is very reluctant to go this route as she is concerned for relapse.  The team had a meeting with risk management, hospital attorney and unit medical director to discuss the situation and discharge options.  The patient has capacity to make medical decisions, she verbalized repeatedly that her desire to leave though she appreciates the risks that this involve.  We will consider her verbal request has verbal 3-day notice which the patient repeatedly refuses to sign.  The hospitalist and the patient's primary care are working on finding an oxygenator for the patient.  The hospitalist has been doing it for over a week now and so far there is no available equipment for the patient.  The concern is that the patient will desaturate and will need another admission within a day without oxygen.  The suggestion of risk management was risk evaluation from [facility] as the patient is their client.it was communicated to [facility] and they promised to do the the risk evaluation.  The social worker made multiple referrals to rehabs, substance abuse programs, medical respites, [facility] respite, housing and placement options with [facility] and there is no available placement for the patient at the moment unfortunately.      The writer requested independent capacity evaluation for the patient and that will be performed  today.  Allergies:  Review of Patient's Allergies indicates:   Bee venom               Anaphylaxis   Carbamazepine           Hives   Methylprednisolone      Dizziness, Drowsiness    Comment:Also believes syncope. Seems to tolerate             inhalational and epidural steroid.   Nutritional supplem*       Hydroxyzine             Nausea Only    Scheduled Meds:  Current Facility-Administered Medications   Medication Dose Route Frequency Last Admin    azithromycin (ZITHROMAX) tablet 500 mg  500 mg Oral Once per day on Mon  Wed Fri 500 mg at 01/05/22 0931    clotrimazole (LOTRIMIN AF) 1 % cream   Topical BID Given at 01/04/22 1903    melatonin tablet 3 mg  3 mg Oral Nightly 3 mg at 01/04/22 1902    QUEtiapine (SEROquel) tablet 50 mg  50 mg Oral Nightly 50 mg at 01/04/22 1900    benzonatate (TESSALON) capsule 100 mg  100 mg Oral TID 100 mg at 01/05/22 6578    metolazone (ZAROXOLYN) tablet 5 mg  5 mg Oral Daily 5 mg at 01/05/22 0935    torsemide (DEMADEX) tablet 120 mg  120 mg Oral TID 120 mg at 01/05/22 1005    atorvastatin (LIPITOR) tablet 80 mg  80 mg Oral Daily 80 mg at 01/05/22 0937    budesonide-formoterol (SYMBICORT) 160-4.5 MCG/ACT inhaler 2 puff  2 puff Inhalation 2 times daily 2 puff at 01/05/22 0939    buprenorphine-naloxone (SUBOXONE) 8-2 MG SL tablet 1 tablet  8 mg Sublingual TID 1 tablet at 01/05/22 0933    gabapentin (NEURONTIN) capsule 600 mg  600 mg Oral TID 600 mg at 01/05/22 0939    levETIRAcetam (KEPPRA) tablet 750 mg  750 mg Oral BID 750 mg at 01/05/22 0936    levothyroxine (SYNTHROID) tablet 25 mcg  25 mcg Oral DAILY 25 mcg at 01/05/22 0524    loratadine (CLARITIN) tablet 10 mg  10 mg Oral Daily 10 mg at 01/05/22 0935    loxapine (LOXITANE) capsule 25 mg  25 mg Oral BID 25 mg at 01/05/22 0936    melatonin tablet 3 mg  3 mg Oral Nightly 3 mg at 01/04/22 1901    methocarbamol (ROBAXIN) tablet 750 mg  750 mg Oral TID 750 mg at 01/05/22 0933    naproxen (NAPROSYN) tablet 500 mg  500 mg  Oral BID WC 500 mg at 01/05/22 0937    nicotine (NICODERM CQ) 21 MG/24HR 1 patch  1 patch Transdermal Daily 1 patch at 01/05/22 0945    OXcarbazepine (TRILEPTAL) tablet 150 mg  150 mg Oral BID 150 mg at 01/05/22 0938    pantoprazole (PROTONIX) EC tablet 40 mg  40 mg Oral Daily 40 mg at 01/05/22 0939    prazosin (MINIPRESS) capsule 3 mg  3 mg Oral Nightly 3 mg at 01/04/22 1900    sennosides (SENOKOT) tablet 8.6 mg  8.6 mg Oral Daily 8.6 mg at 01/05/22 0937    thiamine (VITAMIN B-1) tablet 100 mg  100 mg Oral Daily 100 mg at 01/05/22 4696    aspirin chewable tablet 81 mg  81 mg Oral Daily 81 mg at 01/05/22 0935    FLUoxetine (PROzac) capsule 20 mg  20 mg Oral Daily 20 mg at 01/05/22 0937    lidocaine (SALONPAS) 4 % patch 1 patch  1 patch Topical Q24H 1 patch at 01/05/22 0941         PRN Meds:  Current Facility-Administered Medications   Medication Dose Route Frequency Last Admin    nicotine polacrilex  4 mg Oral Q2H PRN 4 mg at 01/05/22 0957    fluticasone  1 spray Each Nostril BID PRN 1 spray at 01/03/22 0919    polyethylene glycol  17 g Oral Daily PRN 17 g at 01/03/22 0525    melatonin  3 mg Oral QHS PRN, MR X 1 3 mg at 12/28/21 0141    benzocaine-menthol  1 lozenge Buccal Q2H PRN      nystatin   Topical BID PRN Given at 12/24/21 1645  acetaminophen  650 mg Oral Q6H PRN 650 mg at 01/01/22 0224    albuterol HFA  2 puff Inhalation Q6H PRN 2 puff at 01/01/22 0226    diclofenac  2 g Topical TID PRN 2 g at 01/04/22 0947    docusate sodium  100 mg Oral Q12H PRN 100 mg at 01/03/22 2052    ondansetron  4 mg Oral Q8H PRN 4 mg at 01/04/22 2236    magnesium hydroxide  30 mL Oral Daily PRN 30 mL at 01/02/22 2023    aluminum-magnesium hydroxide-simethicone  30 mL Oral Q2H PRN 30 mL at 12/25/21 1106         VITALS:  Patient Vitals for the past 24 hrs:   BP Temp Temp src Pulse Resp SpO2   01/05/22 0935 130/79 -- -- -- -- --   01/05/22 0933 -- -- -- -- 18 --   01/05/22 0733 130/79 97.8 F (36.6 C) TEMPORAL  82 18 93 %   01/05/22 0530 -- -- -- 86 18 95 %   01/04/22 1900 121/69 -- -- -- 18 --   01/04/22 1605 99/64 95.8 F (35.4 C) TEMPORAL 85 18 93 %   01/04/22 1408 -- -- -- -- 18 --       MENTAL STATUS EXAM:   Mental Status Exam   General Appearance: Dressed appropriately  Behavior: Cooperative;Good eye contact  Level of Consciousness: Alert  Orientation Level: Grossly Intact  Attention/Concentration: WNL  Mannerisms/Movements: Involuntary movements (hands tremors)  Speech Quality and Rate: WNL  Speech Clarity: Clear  Speech Tone: Normal vocal inflection  Vocabulary/Fund of Knowledge: WNL  Memory: Grossly intact  Thought Process & Associations: Internally preoccupied  Dissociative Symptoms: None  Thought Content: Preoccupations  Delusions: None  Hallucinations: None  Suicidal Thoughts: None  Homicidal Thoughts: None  Mood: Anxious;Irritable  Affect: Congruent with mood  Judgment: Poor  Insight: Poor    C-SSRS:  Default Flowsheet Data (most recent)     C-SSRS Frequent Screener - 12/24/21 0005        C-SSRS Frequent Screener    Since last contact, have you actually had thoughts about killing yourself ?  Yes     Have you been thinking about how you might do this?  No     Have you had these thoughts and had some intention of acting on them?  No     Have you started to work out or worked out the details of how to kill yourself? Do you intend to carry out this plan? No     Have you done anything, started to do anything, or prepared to do anything to end your life?  No     C-SSRS Frequent Screener Risk Score Low Risk                 Default Flowsheet Data (most recent)     C-SSRS Risk Assessment - 12/23/21 1344        C-SSRS Risk Assessment    Suicidal and Self-Injurious Behavior (Past 3 Months) Aborted or Self-Interrupted attempt     Suicidal and Self-Injurious Behavior (Lifetime) Actual suicide attempt;Self-injurious behavior without suicidal intent     Suicidal Ideation Check Most Severe in Past Month Wish to be dead      Activating Events (Recent) Current or pending isolation or feeling alone     Treatment History Hopeless or dissatisfied with treatment;Non-compliant with treatment     Protective Factors (Recent) Identifies reasons for living;Supportive social network or family;Belief that  suicide is immoral, high spirituality;Responsibility to family or others, living with family;Fear of death or dying due to pain and suffering     Any suicidal, self-injurious, or aggressive behaviors?  Yes     Describe any suicidal, self-injurious or aggressive behavior (INCLUDE DATES) cutting behavior multiple episodes, last time during teh last month                 Physical/Somatic Complaints  The patient lists: pain, shortness of breath, unsteady gait.          DSM-V    Primary Diagnosis: Major depressive disorder, recurrent episode, severe   ([name])   Secondary Diagnosis: Borderline personality disorder ([name])   General medical conditions: Seizure Disorder, Hyperlipidemia, Diabetes   Mellitus, Type II, Coronary Artery Disease, Chronic Pain, Chronic   Obstructive Pulmonary Disease  Psychosocial and Contextual Factors: Housing problems, Problems related to   social environment, Problems with access to health care services, Problems   with primary support group           Assessment  Cheryl Zimmerman is a 51 year old English-speaking woman with longstanding psych history, including chart diagnoses of PTSD, SCAD, BPAD, and PSUD,multiple hospitalizations starting in childhood, onantipsychotic medication since age 30,multiple prior suicide attempts and SIB requiring medical attention,severe head trauma and seizure d/o, and complex medical illness, who lives in a group home,presenting to ED with increasing suicidal ideation,hallucinations,and trauma reexperiencing symptoms initially admitted to medicine because of hypoxia and transferred to geriatric psychiatry because of ongoing suicidality.     Diagnostic uncertainty given overlapinchart  diagnoses. Patient identifies her most concerning symptoms are those associated with PTSD,namely nightmares, intrusive memories.Visual hallucinations may be decompensation of SCAD,trauma-related(e.g. flashbacks),major depressive episode with psychotic features, or transient psychosis in the setting of stress as seen in some personality disorders. Memory deficits on exam may be due to severe history of head injury, polypharmacy and substance use,or vitamin deficiency.    In terms of risk, patient endorsed SI with intent and method(states she will jump in front of a train at the nearest station as soon as she is discharged) on admissionand believed that ending her life will reunite her and have been with her deceased son. She currently denies those thoughts. Her trauma-reexperiencing and psychotic symptoms related to her son are heightening her risk of suicide. Additional risk factors include history of multiple attempts, recent SIB requiring medical attention, history of head trauma, comorbid substance use, preparatory behaviors/attempt in ED,impulsivity,perceived lack of social support, and chronic pain.There are a few mitigating risk factors and patient at  is  agreeable to safety planning. She feels much better ad is ready to discuss dc options and placement.     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 prozac 20 mg po daily    Anxiety:  Attempted to stop lorazepam completely but unfortunately the patient complains of anxiety and the positive bowel side effects of antihistamine medications and antipsychotics are very concerning for her.  One-time dose of Ativan 0.5 mg was given.    #Dyspnea on exertion  #Peripheral edema  #HFpEF (EF 70%, no WMA on TTE 12/07/21)  #OHS, OSA  #COPD/asthma  -feels dyspnea, peripheral edema worse since recent hospital discharge, missing about ~ 1d of medication  -possibly w/ mild CHF exacerbation from missed diuretic doses  -no e/o  infection or copd exacerbation  -increase torsemide to 120 bid (similar to recent admission, requiring an increased dose briefly) (day team to  follow-up daily to adjust dose prn. Had been discharged on torsemide 100 bid)  -I/Os, daily weights, vitals  -SpO2 goal 88-92, if SpO2 >90, does not need supplemental O2 (may need it only overnight 1-2L)  -admitted on ~2 L supplemental O2 w/ SpO2 >92 (baseline is closer to ~90 on chart review); on review of current presentation, lowest O2 on flow sheet was 91 on room air  -c/w ICS/LABA, SABA prn  -f/u nt-pro-bnp and repeat basic labs    #burning sensation in lungs/right back  -suspect due to recent cocaine use (onset after hospital d/c, cocaine use)  -no concerning skin changes on back  -no concerning urinary sxs, f/u ua  -trial of gi cocktail and lidocaine patch    #complex chronic pain syndrome  #lumbar spinal stenosis w/ sciatica  #L knee pain, improved  #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  -had mechanical fall while on recent psych admission resulting in increased L knee pain and difficulty ambulating, requiring transfer to medicine  -evaluated by ortho who felt MRI L knee findings appeared chronic, seen by PT who recommended skilled PT and STR  -c/w tylenol, naproxen, suboxone, gabapentin, robaxin    #PSUD  -recovery coach consult  -uds + for marijuana, cocaine, suboxone  -c/w suboxone tid (takes also for chronic pain)  -cessation, NRT    #Seizure d/o  -c/w keppra, trileptal    #HLD  -c/w statin    #h/o HCV  -recent viral load undetectable, cleared on her own      #housing instability  -social work consult    Diet-cardiac  15 minutes checks  DVT prophylaxis-sch  Med rec- completew/ recent d/c med list  PCP-unclear; possiblyJean Maisie Fus, FNP  HCP-not on file  Code- full       Review with patient: Treatment plan reviewed with the patient.

## 2022-01-06 MED ORDER — PRAZOSIN HCL 1 MG PO CAPS
3.0000 mg | ORAL_CAPSULE | Freq: Every evening | ORAL | 0 refills | Status: DC
Start: 2022-01-06 — End: 2022-04-12

## 2022-01-06 MED ORDER — FLUTICASONE PROPIONATE 50 MCG/ACT NA SUSP
1.00 | Freq: Two times a day (BID) | NASAL | 0 refills | Status: AC | PRN
Start: 2022-01-06 — End: 2022-02-05

## 2022-01-06 MED ORDER — ASPIRIN 81 MG PO CHEW
81.00 mg | CHEWABLE_TABLET | Freq: Every day | ORAL | 0 refills | Status: AC
Start: 2022-01-06 — End: 2022-02-05

## 2022-01-06 MED ORDER — NICOTINE POLACRILEX 4 MG MT GUM
4.00 mg | CHEWING_GUM | OROMUCOSAL | 0 refills | Status: AC | PRN
Start: 2022-01-06 — End: 2022-02-05

## 2022-01-06 MED ORDER — AZITHROMYCIN 500 MG PO TABS
500.00 mg | ORAL_TABLET | ORAL | 0 refills | Status: AC
Start: 2022-01-07 — End: 2022-02-06

## 2022-01-06 MED ORDER — DICLOFENAC SODIUM 1 % EX GEL
2.00 g | Freq: Three times a day (TID) | CUTANEOUS | 0 refills | Status: AC | PRN
Start: 2022-01-06 — End: 2022-02-05

## 2022-01-06 MED ORDER — LORATADINE 10 MG PO CAPS
10.00 mg | ORAL_CAPSULE | Freq: Every day | ORAL | 0 refills | Status: AC
Start: 2022-01-06 — End: 2022-02-05

## 2022-01-06 MED ORDER — BUDESONIDE-FORMOTEROL FUMARATE 160-4.5 MCG/ACT IN AERO
2.0000 | INHALATION_SPRAY | Freq: Two times a day (BID) | RESPIRATORY_TRACT | 0 refills | Status: DC
Start: 2022-01-06 — End: 2022-04-12

## 2022-01-06 MED ORDER — LEVOTHYROXINE SODIUM 25 MCG PO TABS
25.0000 ug | ORAL_TABLET | Freq: Every morning | ORAL | 0 refills | Status: DC
Start: 2022-01-06 — End: 2022-04-12

## 2022-01-06 MED ORDER — LEVETIRACETAM 750 MG PO TABS
750.0000 mg | ORAL_TABLET | Freq: Two times a day (BID) | ORAL | 0 refills | Status: DC
Start: 2022-01-06 — End: 2022-01-27

## 2022-01-06 MED ORDER — GABAPENTIN 300 MG PO CAPS
600.0000 mg | ORAL_CAPSULE | Freq: Three times a day (TID) | ORAL | 0 refills | Status: DC
Start: 2022-01-06 — End: 2022-04-12

## 2022-01-06 MED ORDER — ATORVASTATIN CALCIUM 80 MG PO TABS
80.0000 mg | ORAL_TABLET | Freq: Every day | ORAL | 0 refills | Status: DC
Start: 2022-01-06 — End: 2022-04-12

## 2022-01-06 MED ORDER — OXCARBAZEPINE 150 MG PO TABS
150.0000 mg | ORAL_TABLET | Freq: Two times a day (BID) | ORAL | 0 refills | Status: DC
Start: 2022-01-06 — End: 2022-04-12

## 2022-01-06 MED ORDER — PANTOPRAZOLE SODIUM 40 MG PO TBEC
40.00 mg | DELAYED_RELEASE_TABLET | Freq: Every day | ORAL | 0 refills | Status: AC
Start: 2022-01-06 — End: 2022-02-05

## 2022-01-06 MED ORDER — TORSEMIDE 60 MG PO TABS
120.0000 mg | ORAL_TABLET | Freq: Three times a day (TID) | ORAL | 0 refills | Status: DC
Start: 2022-01-06 — End: 2022-01-27

## 2022-01-06 MED ORDER — LOXAPINE SUCCINATE 25 MG PO CAPS
25.0000 mg | ORAL_CAPSULE | Freq: Two times a day (BID) | ORAL | 0 refills | Status: DC
Start: 2022-01-06 — End: 2022-04-12

## 2022-01-06 MED ORDER — LIDOCAINE 4 % EX PTCH
1.00 | MEDICATED_PATCH | CUTANEOUS | 0 refills | Status: AC
Start: 2022-01-06 — End: 2022-02-05

## 2022-01-06 MED ORDER — TORSEMIDE 60 MG PO TABS
120.0000 mg | ORAL_TABLET | Freq: Two times a day (BID) | ORAL | 0 refills | Status: DC
Start: 2022-01-06 — End: 2022-01-27

## 2022-01-06 MED ORDER — NICOTINE 21 MG/24HR TD PT24
1.00 | MEDICATED_PATCH | Freq: Every day | TRANSDERMAL | 0 refills | Status: AC
Start: 2022-01-07 — End: 2022-02-06

## 2022-01-06 MED ORDER — QUETIAPINE FUMARATE 50 MG PO TABS
50.0000 mg | ORAL_TABLET | Freq: Every evening | ORAL | 0 refills | Status: DC
Start: 2022-01-06 — End: 2022-01-27

## 2022-01-06 MED ORDER — METHOCARBAMOL 750 MG PO TABS
750.0000 mg | ORAL_TABLET | Freq: Three times a day (TID) | ORAL | 0 refills | Status: DC
Start: 2022-01-06 — End: 2022-01-27

## 2022-01-06 MED ORDER — METOLAZONE 5 MG PO TABS
5.0000 mg | ORAL_TABLET | Freq: Every day | ORAL | 0 refills | Status: DC
Start: 2022-01-07 — End: 2022-01-27

## 2022-01-06 MED ORDER — BUPRENORPHINE HCL-NALOXONE HCL 8-2 MG SL SUBL
1.0000 | SUBLINGUAL_TABLET | Freq: Three times a day (TID) | SUBLINGUAL | 0 refills | Status: DC
Start: 2022-01-06 — End: 2022-01-27

## 2022-01-06 MED ORDER — ALBUTEROL SULFATE HFA 108 (90 BASE) MCG/ACT IN AERS
2.0000 | INHALATION_SPRAY | Freq: Four times a day (QID) | RESPIRATORY_TRACT | 0 refills | Status: DC | PRN
Start: 2022-01-06 — End: 2022-04-12

## 2022-01-06 MED ORDER — LEVETIRACETAM 750 MG PO TABS
750.0000 mg | ORAL_TABLET | Freq: Two times a day (BID) | ORAL | 0 refills | Status: DC
Start: 2022-01-06 — End: 2022-04-12

## 2022-01-06 MED ORDER — FLUOXETINE HCL 20 MG PO CAPS
20.0000 mg | ORAL_CAPSULE | Freq: Every day | ORAL | 0 refills | Status: DC
Start: 2022-01-06 — End: 2022-04-12

## 2022-01-06 MED ORDER — NICOTINE POLACRILEX 4 MG MT GUM
4.0000 mg | CHEWING_GUM | OROMUCOSAL | 0 refills | Status: DC | PRN
Start: 2022-01-06 — End: 2022-01-27

## 2022-01-06 MED ORDER — CLOTRIMAZOLE 1 % EX CREA
TOPICAL_CREAM | Freq: Two times a day (BID) | CUTANEOUS | 0 refills | Status: AC
Start: 2022-01-06 — End: 2022-02-05

## 2022-01-06 MED FILL — NICOTINE TD DIS 21MG/24H: 28 days supply | Qty: 28 | Fill #0

## 2022-01-06 MED FILL — PANTOPRAZOLE 40MG: 30 days supply | Qty: 30 | Fill #0

## 2022-01-06 MED FILL — LEVETIRACETA 750MG: 30 days supply | Qty: 60 | Fill #0

## 2022-01-06 MED FILL — ASPIRIN LOW CHW 81MG: 30 days supply | Qty: 30 | Fill #0

## 2022-01-06 MED FILL — SYMBICORT AER 160-4.5: 30 days supply | Qty: 11 | Fill #0

## 2022-01-06 MED FILL — PRAZOSIN HCL 1MG: 30 days supply | Qty: 90 | Fill #0

## 2022-01-06 MED FILL — FLUTICASONE SPR 50MCG: 30 days supply | Qty: 16 | Fill #0

## 2022-01-06 MED FILL — OXCARBAZEPIN 150MG: 30 days supply | Qty: 60 | Fill #0

## 2022-01-06 MED FILL — NICOTINE POL GUM 4MG MINT: 8 days supply | Qty: 100 | Fill #0

## 2022-01-06 MED FILL — LEVOTHYROXIN 25MCG: 30 days supply | Qty: 30 | Fill #0

## 2022-01-06 MED FILL — DICLOFENAC SOD GL 1% 100GM TOP: 16 days supply | Qty: 100 | Fill #0

## 2022-01-06 MED FILL — FLUOXETINE   20MG: 30 days supply | Qty: 30 | Fill #0

## 2022-01-06 MED FILL — GABAPENTIN 300 MG: 30 days supply | Qty: 180 | Fill #0

## 2022-01-06 MED FILL — AZITHROMYCIN 500MG: 28 days supply | Qty: 12 | Fill #0

## 2022-01-06 MED FILL — BUPREN/NALOX SUB 8-2MG TAB: 7 days supply | Qty: 21 | Fill #0

## 2022-01-06 MED FILL — METOLAZONE  5MG: 30 days supply | Qty: 30 | Fill #0

## 2022-01-06 MED FILL — ALBUTEROL AER HFA: 25 days supply | Qty: 9 | Fill #0

## 2022-01-06 MED FILL — METHOCARBAM 750MG: 30 days supply | Qty: 90 | Fill #0

## 2022-01-06 MED FILL — LOXAPINE 25MG: 30 days supply | Qty: 60 | Fill #0

## 2022-01-06 MED FILL — ANTIFUNGAL CRE 1%: 7 days supply | Qty: 15 | Fill #0

## 2022-01-06 MED FILL — ATORVASTATIN 80MG: 30 days supply | Qty: 30 | Fill #0

## 2022-01-06 NOTE — Plan of Care (Signed)
Problem: Psychosis Symptoms  Goal: Reduction in Intensity and Frequency (Psychosis)  Outcome: Progressing     Pt alert, and oriented x 2 to 3, visible at times, took shower, declined groups, watched movies on tablet. Pt with labile mood, irritable, anxious, frustrated about discharge, verbally abusive towards staff. Compliant with most meds except Suboxone at 2 PM, pt stated "she would not wait for Suboxone to dissolve in her mouth prior to leave". Pt requested, and medicated with PRN Nicotine gum x 1 for nicotine craving, PRN Flonase for nasal congestion, PRN stool softener for 2 days constipation with pending effect. Pt denies SI/HI/AH/VH. Pt continues to be non compliant with oxygen order, O2 sat 94%, no SOB/ respiratory distress noted or reported. Pt ate 60% bkfst at bedside, refused lunch, fluid intake adequate, last BM 01/04/22. No Covid SX noted or reported. Safety checks, and rounding protocol maintained.  BP 111/69   Pulse 93   Temp 97.2 ?F (36.2 ?C) (Temporal)   Resp 18   Ht 5\' 9"  (1.753 m)   Wt (!) 152.1 kg (335 lb 6.4 oz)   SpO2 94%   BMI 49.53 kg/m?

## 2022-01-06 NOTE — Discharge Summary (Signed)
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 3 MG Tabs tablet     naloxone 4 MG/0.1ML spray  common name: NARCAN     naproxen 250 MG tablet  common name: NAPROSYN     naproxen 500 MG tablet  common name: NAPROSYN     nystatin cream  common name: MYCOSTATIN     nystatin powder  common name: MYCOSTATIN     omeprazole 40 MG capsule  common name: PriLOSEC     ondansetron 4 MG disintegrating tablet  common name: ZOFRAN-ODT     ondansetron 8 MG disintegrating tablet  common name: ZOFRAN-ODT     sennosides 8.6 MG tablet  common name: SENOKOT     sodium phosphate 7-19 GM/118ML enema     thiamine 100 mg tablet  common name: VITAMIN B-1           Where to Get Your Medications      These medications were sent to [facility], [place], [place] - [address]  [address] [place] [address]    Phone: [phone]    albuterol HFA 108 (90 Base) MCG/ACT inhaler   aspirin 81 MG chewable tablet   atorvastatin  80 MG tablet   azithromycin 500 MG tablet   budesonide-formoterol 160-4.5 MCG/ACT inhaler   buprenorphine-naloxone 8-2 MG sublingual tablet   clotrimazole 1 % cream   diclofenac 1 % Gel Gel   FLUoxetine 20 MG capsule   fluticasone 50 MCG/ACT nasal spray   gabapentin 300 MG capsule   levETIRAcetam 750 MG tablet   levETIRAcetam 750 MG  tablet   levothyroxine 25 MCG tablet   lidocaine 4 % Ptch   Loratadine 10 MG Caps   loxapine 25 MG capsule   methocarbamol 750 MG tablet   metolazone 5 MG tablet   nicotine 21 MG/24HR   nicotine polacrilex 4 MG gum   nicotine polacrilex 4 MG gum   OXcarbazepine 150 MG tablet   pantoprazole 40 MG tablet   prazosin 1 MG capsule   QUEtiapine 50 MG tablet   Torsemide 60 MG Tabs   Torsemide 60 MG Tabs     You can get these medications from any pharmacy    Bring a paper prescription for each of these medications   OTHER MEDICATION   OTHER MEDICATION           Current number of prescribed, standing, antipsychotic meds:                 We discussed risks associated with antipsychotics, including extrapyramidal symptoms (EPS), weight gain, metabolic syndrome, cardiac problems, and tardive dyskinesia (TD).  We discussed the nature of EPS (stiffness, tremor, restlessness) and strategies for managing them, should they appear. Patient/Guardian understands that we will need to regularly monitor for weight gain and signs of metabolic disruption (increased serum cholesterol, glucose, and/or triglycerides) and take steps to prevent them because these effects can lead to diabetes and its associated health risks. Patient/Gardian understands black box warning.  Patient/Guardian also understands that we will need to regularly monitor for evidence of TD, and that TD symptoms, once they appear, may be irreversible.          Discharge Activity: activity as tolerated    Discharge Diet: diabetic diet    "Hand-Off" Transition Issues:   Please continue O2 at night and during the day when you feel dizzy or light headed or when your O2 level is below 92.    Disposition/Living Situation:  Anticipated Discharge Plan  Expected Discharge Date: 01/06/22  Lives With: Unable to assess  Anticipated Outcome: Shelter  Shelter: [facility])  Skilled Nursing Facility (SNF): Other (comment) (N/A)  Transition Record: The patient/guardian  consented to have discharge documentation sent to the next level of care provider: Yes  Transition Record: Discharge documentation was sent to: PCP  Provider name and type (MD, SW, etc.): [name], MD  Date of communication: 01/06/22  Time of communication: 1300  Method of communication: Access to shared EMR  Reason No Scheduled 7-Day Aftercare Appointment: Referral made and provider to contact patient  Discharge documentation also sent to: Other provider 1  Other Provider Info (1): Worthy Keeler - MS, [name], [name], [facility] ([place])  Team Leader - NS ACCS Team 2  Transportation at Discharge: Taxi  Patient expects to be discharged to:: Shelter  Address: [address], [place] [address]  Contact Number After Discharge: [phone]  Best time of day to contact: Anytime  Nurse / Staff  Has Permission to Speak With:: N/A  Relationship to Patient: Self  Interpreter Requested: No  Home Care Services: No  Type of Home Care Services: None  Family Notified of Disposition: No (comment) (No Family Contact)  Living Situation  Lives With: Unable to assess  Type of Residence: Homeless-shelter  Living Setting: Homeless/transient    Discharge Instructions:  Please follow up with outpatient providers and continue all medications as prescribed.    Future appointments:  Future Appointments   Date Time Provider Department Center   01/24/2022  1:00 PM Delena Serve, APRN [facility]   01/31/2022  2:10 PM [name], MD [facility]       Follow-up Information     Follow up With Specialties Details Why Contact Info    Worthy Keeler - MS, [name], [name], [facility] ([place])  Follow up Please reach out for support in the community as needed. [facility]  [address]  [place], [place] [address] [address]  Office: [phone]    [facility] ACCS and Respite, Regional Director, Consulting civil engineer,  LICSW  Follow up Please reach out for support in the community as needed. [facility]  [address]  [place], [address]  ( Office )  [phone]  ( Cell) [phone]  Fax: [phone]    [facility] Psychiatry Urgent Walk-In Clinic  Follow up Behavioral Health Urgent Care is a walk-in service for people of all ages. When you visit, you will get an assessment to identify any behavioral health issues and treatment needs. They provide treatment on site and referral to other services for ongoing care. [facility] 1 Building  [address]  [place], [place] [address]  Tel: [phone]  24-hour Psychiatry Access Line: [phone]  Mondays, Wednesdays, and Fridays  9am - 5pm    [facility]  Follow up on 01/06/2022 They can provide referrals to services, onsite housing search enrollments and other supportive services as available. They provide resources: 40 mats, 2 meals (dinner and takeaway breakfast), coffee, snacks, bathroom/showers, laundry, outdoor smoking area. [address]  [place], [place] [address]  Entrance in [place]  Tel: [phone]  Hours: 7pm - 7am. No curfew for walk-ins  Walk In Only    [facility]  Follow up Please contact as needed for your oxygen needs. Main Number: [phone]    FAX: [phone]  Elijah Birk: [phone]  M - F : 9AM - 6PM  Sat :  9AM - 5PM        This writer spent more than 30 minutes on evaluation of the patient, review with medical team, and preparing discharge documentation.  More than 50% of the time was spent on counseling and coordination of care.

## 2022-01-06 NOTE — Progress Notes (Signed)
OT Short-Term Progress Note    S: "In and out, like this?" re: deep breathing    O: Shlonda is a 51 year old woman admitted 12/22/21 with SI/HI in context of schizoaffective d/o, MDD, borderline personality d/o, PSUD, PTSD, and homelessness.     She has been intermittently visible on the unit, social with select peers, enjoys music and watching the [employer] on television. She continues to deny participating in all activity-based groups, however does occasionally attend Breakfast Bites with the milieu.     A: Octa was assisted in exploration of coping skills when observed to be in heightened distress re: d/c planning. On approach, Ione is mostly pleasant but noted to have an irritable edge and has been observed agitated on the unit. Becci is more engagable on a 1:1 basis for brief periods, but has continued to deny engagement in groups. Today, pt introduced to mindfulness, guided visualization, and deep breathing. She reports that they are helpful in the moment, and discusses her current stressor is poor sleep, as well as d/c planning. Educated on benefits of these approaches for sleep hygiene, pt amendable.     Ryana continues to benefit from skilled Psych OT services to promote daily structure and socialization?in order to address?deficits in coping skills and symptom mgt. Additionally, Sarahelizabeth would benefit from group and individual treatment with an emphasis on?symptom management, sensory based treatment, improving self-care, improving safety, stress/anger management, relapse prevention, communication skills/self-expression/self-advocacy, self-awareness/self-worth, functional goal setting, identifying self-soothing/grounding/coping skills, exploration of warning signs/triggers that lead to hospitalization, and exploration of engagement in meaningful occupations?in order to promote functional independence in daily activities/occupations in both home and community settings.?  ?  OT GOALS:  STG: Terry  will trial 3 novel coping skills to increase base of knowledge and reduce risk of rehospitalization by 01/06/22 (GOAL MET)    NEW STG: Achol will trial 2 coping skills when feeling heightened levels of distress to increase emotional regulation and reduce risk for rehospitalization by 01/20/22.    LTG: Azilee will identify one novel and accessible coping skill of interest to initiate in the community and to reduce risk for rehospitalization by 01/20/22    P:   ? Pt to continue to be encouraged to participate in all groups and milieu activities. OT to evaluate need for alternative treatment if appropriate.  ? Pt to receive orientation, psycho-education, and practice with sensory tools and items available on the unit to enhance opportunities to develop self-regulation strategies.   ? OT to continue to assess pt's functional, sensory, self-regulation, social, and cognitive skills, and to determine strengths and limitations in relation to pt's participation in valued occupations and in maintaining safety. ??????  ?  Gayland Curry, [place], Lic # [id number]

## 2022-01-06 NOTE — Plan of Care (Signed)
Patient received in her room resting with eyes closed at change of shift in no apparent distress, voiced no complaints, no SOB noted or reported, no Covid 19 SX, patient has been non-compliant with oxygen therapy, needed frequent redirection and teaching, 1:1 emotional nursing support provided, refused teaching, patient was observed sitting at the edge of her bed dozing on and off with her head down, when redirected to lay back in bed, patient became iritable came out at the nursing station yelling out loud threatening to report staff for harrassing her about oxygen therapy and how to lay in bed to sleep, out of bed to void around 0300, Sat 95% RA, nicorette gum at 0634 at her request, slept 4.3 hr, LBM 01/04/2022,  patient stable will continue with plan of care.

## 2022-01-06 NOTE — Group Note (Signed)
OT Psych Group Documentation    Topics: Project      Occupations Addressed: Leisure, Social participation     Skills addressed: Fine/gross motor skills, process skills, social interaction skills, behavioral control, mental functions     '            Date of group: 01/06/2022  Start Time: 1100  End Time: 1145    Attendance: Declined      Al[place] [id number]

## 2022-01-06 NOTE — Progress Notes (Addendum)
SW Discharge Note:  Cheryl Zimmerman is a 51 year old, unemployed, divorced white female, who was admitted to [facility] on 12/22/21 will discharge to [facility], located at [address] [place], [place] [address]. The entrance is in the rear. Pt will be given a taxi voucher. Pt reported feeling safe and ready for discharge. Denies SI/HI. Pt received the portable oxygen concentrator today. Pt was told by the [facility] [employer] delivery driver that the Home Oxygen Concentrator Machine will still be sent to [facility], located in [place] at this time until pt calls back and updates her new address. Pt reported she understood. Pt said she will call Elijah Birk (tel: [phone]) from [facility] [employer] [facility] to update her address when she is at a stable housing location. SW assisted pt with setting up post discharge primary care doctor's appointment. SW emailed [employer] and [facility] about pt's updated disposition.     Discharge Follow Up:           Follow up with Worthy Keeler - MS, [name], [name], [facility] ([place])  Please reach out for support in the community as needed. [facility]   [address]   [place], [place] [address] [address]   Office: [phone]          Follow up with [facility] and Respite, Regional Director, Lorrine Kin,  LICSW  Please reach out for support in the community as needed. [facility]   [address]   [place], [address]   ( Office [phone]   ( Cell) [phone]   Fax: [phone]          Follow up with [facility] Psychiatry Urgent Walk-In Clinic  Behavioral Health Urgent Care is a walk-in service for people of all ages. When you visit, you will get an assessment to identify any behavioral health issues and treatment needs. They provide treatment on site and referral to other services for ongoing care. [facility] 1 Building   [address]   [place], [place] [address]   Tel: [phone]   24-hour Psychiatry  Access Line: [phone]   Mondays, Wednesdays, and Fridays   9am - 5pm          Follow up with [facility]  Please contact as needed for your oxygen needs. Main Number: [phone]     FAX: [phone]   Tom: [phone]   M - F : 9AM - 6PM   Sat :  9AM - 5PM          Follow up with [facility]  Thursday Jan 06, 2022  They can provide referrals to services, onsite housing search enrollments and other supportive services as available. They provide resources: 40 mats, 2 meals (dinner and takeaway breakfast), coffee, snacks, bathroom/showers, laundry, outdoor smoking area. [address]   [place], [place] [address]   Entrance in [place]   Tel: [phone]   Hours: 7pm - 7am. No curfew for ?walk-ins?   Walk In Only   [facility] Follow-Up with Delena Serve, APRN  Monday Jan 24, 2022 1:00 PM Wilmon Arms by 12:45 PM) [facility]  [address]   1st Floor   [place] [address]  [phone]   [phone] Established Patient with [name], MD  Monday Jan 31, 2022 2:10 PM (Arrive by 1:55 PM) [facility]  [address]   1st Floor  [place] [address]  [phone]     Collaterals:  -[facility], [name], MD, [phone]  -[facility] [address] [place], [place]: [phone], Fax: [phone]  -Team Leader - NS ACCS Team 2, Worthy Keeler - MS, [name], [place], [facility] [place]), Tel: [phone], Fax: [phone]  [address] [place], [place] [address] [address]  -[facility], Main Number: [phone], FAX: [phone]    Elijah Birk: [phone]   (Call for pt's oxygen needs)    Bridget Hartshorn, LCSW

## 2022-01-06 NOTE — Group Note (Signed)
OT Psych Group Documentation    Topics: Breakfast Bites      Occupations Addressed: Activities of Daily living, Social participation, Instrumental activities of daily living (health management & maintenance)     Skills addressed: Fine/gross motor skills, process skills, social interaction skills, behavioral control, mental functions (cognitive, perceptual, affective)       Date of group: 01/06/2022  Start Time: 0800  End Time: 0845    Attendance: Declined      Sabri[place] [id number]

## 2022-01-06 NOTE — Progress Notes (Signed)
Patient was discharged today.  Insurance remained CCA throughout this hospital stay.  She did not want to appeal her DC per Medicare protocol.  IM Medicare form was sent to [place] department and case was closed.

## 2022-01-06 NOTE — Group Note (Signed)
OT Psych Group Documentation    Topics: Feel Good      Occupations Addressed: Activities of daily living (functional mobility), Instrumental activities of daily living (health management & maintenance), Leisure, Social participation      Skills addressed: Fine/gross motor skills, social interaction skills, process skills, emotional regulation, mental functions         Date of group: 01/06/2022  Start Time: 1015  End Time: 1100    Attendance: [place] [id number]

## 2022-01-06 NOTE — Miscellaneous (Signed)
Patient educated on use of [employer] home O2 concentrator and demonstrates ability to operate correctly on her own.

## 2022-01-06 NOTE — Plan of Care (Addendum)
Problem: Mood-related symptoms  Description: Pt. Is a 51 year old female with chronic mental illness who recently left group home and now homeless. PMH: obesity, COPD, CHF, spinal stenosis. Diagnosis MDD with SI. Pt. Reports no SI on admission.   Goal: Reduction in Intensity and Frequency (Mood)  Description: Pt. Will seek out staff if having any Suicidal ideation on day one of admission. Pt. Will be medication adherent on day one of admission. Pt. Will attend scheduled groups daily on day two of admission.  Outcome: Adequate for Discharge     Problem: Psychosis Symptoms  Goal: Reduction in Intensity and Frequency (Psychosis)  Outcome: Adequate for Discharge   Patient discharged to a shelter with her belongings from storage room, she has no belongings from the safe. Patient was very irritable all day , swearing at staff redirected with increased agitation. AVS reviewed and given to patient at discharge. Patient refused discharge teaching, told writer " I don't need to hear, I want to leave". Patient discharged with a portable concentrator with tubing, teaching done by respiratory therapist. Staff accompanied her to the lobby to wait for the taxi. Voucher given to patient to pay the fares of the taxi.

## 2022-01-07 ENCOUNTER — Encounter (HOSPITAL_BASED_OUTPATIENT_CLINIC_OR_DEPARTMENT_OTHER): Payer: Self-pay | Admitting: Psychiatry

## 2022-01-07 NOTE — Prior Authorization (Signed)
Patient Insurance: CCA  Member ID: [id number]  BIN: [id number]  PCN: [id number]  Group: [id number]  Prior Authorization for: Dan Humphreys 60MG 

## 2022-01-09 MED FILL — QUETIAPINE 50MG: 30 days supply | Qty: 30 | Fill #0

## 2022-01-10 ENCOUNTER — Telehealth (HOSPITAL_BASED_OUTPATIENT_CLINIC_OR_DEPARTMENT_OTHER): Payer: Self-pay | Admitting: Registered Nurse

## 2022-01-10 NOTE — Telephone Encounter (Signed)
-----   Message from [place] sent at 01/10/2022  1:19 PM EDT -----  Barth Kirks [id number], 51 year old, female    Calls today:  pt is requesting an urgent phone call from PCP , pt stated that she is in need of pain medication    Person calling on behalf of patient: Patient (self)    CALL BACK NUMBER: [phone]  Best time to call back: now   Cell phone:   Other phone:    Patient's language of care: English    Patient does not need an interpreter.    Patient's PCP: [name], MD    Primary Care Home Site:  [facility]

## 2022-01-10 NOTE — Telephone Encounter (Signed)
Calls today: "pt is requesting an urgent phone call from PCP, pt stated that she is in need of pain medication."    Pt contacted and identified. States she was discharged 2 wk's after being admitted. She was sent home with no medication and she doesn't know why. States, "my ride was waiting and I had to leave. My social worker is trying to find me a place to go. I'm at a friends house now and I'm awaiting money for a place to stay. I feel unstable on my feet. My portable O2 machine is not working well. I want to know if my doctor will readmit me again so I can get stable on my med's. I'm on Suboxone and other med's and I'm in so much pain. My body hurts. I'm not sleeping or eating. I'm sweating." States she's on 2L, per night only. She does not want to call 911.     Per EPIC review, pt has (28) prescriptions that were sent on 04/20 + 04/21. She does not know why med's were not provided. Advised pharmacy will be called and call will be returned to pt with plan. [facility] called and [facility] Post contacted. States pt has no co-payments.  Med's awaiting pick up. States leaving all med's at the group home where she was prior to admission.     Pt cannot pick up med's and she does not have a friend or a ride to assist her. She requests an appt or another admission. Advised, med's can be delivered but as she reports symptoms of possible withdrawal a schedule appt is not appropriate at this time. Advised to seek emergency care now as she reports worsening symptoms. Pt declines the need. Pt requests med delivery. Advised if she can call an [facility] to pick up her medications she can receive med's prior to delivery.    DISPO: Pharmacy called again and delivery set up for Wednesday. The pharmacy is not able to deliver sooner. Pt reminded to have phone on and await delivery as she will be called 20-30 min prior to delivery. The pharmacy cannot provide an exact time. Advised to seek a friend or a ride if possible to receive med's  sooner. Pt aware to seek emergency care for worsening symptoms and to call 911 as needed. Advised to not delay care and symptoms could worsen quickly. Reasons to return to the ED were reviewed in detail. Pt agrees with this plan and disposition and has no further questions or concerns at this time.     Friends name and address provided to pharmacy for delivery:   Loraine Maple  [address]  [place] [address]        Jonnie Finner, RN, 01/10/2022

## 2022-01-11 ENCOUNTER — Emergency Department (HOSPITAL_BASED_OUTPATIENT_CLINIC_OR_DEPARTMENT_OTHER): Payer: No Typology Code available for payment source

## 2022-01-11 ENCOUNTER — Encounter (HOSPITAL_BASED_OUTPATIENT_CLINIC_OR_DEPARTMENT_OTHER): Payer: Self-pay | Admitting: Hospitalist

## 2022-01-11 ENCOUNTER — Inpatient Hospital Stay
Admission: EM | Admit: 2022-01-11 | Discharge: 2022-01-27 | DRG: 292 | Disposition: A | Discharge status: Rehabilitation Facility | Payer: No Typology Code available for payment source | Attending: Student in an Organized Health Care Education/Training Program | Admitting: Student in an Organized Health Care Education/Training Program

## 2022-01-11 DIAGNOSIS — G8929 Other chronic pain: Secondary | ICD-10-CM | POA: Diagnosis present

## 2022-01-11 DIAGNOSIS — J449 Chronic obstructive pulmonary disease, unspecified: Secondary | ICD-10-CM

## 2022-01-11 DIAGNOSIS — I2510 Atherosclerotic heart disease of native coronary artery without angina pectoris: Secondary | ICD-10-CM | POA: Diagnosis not present

## 2022-01-11 DIAGNOSIS — F17200 Nicotine dependence, unspecified, uncomplicated: Secondary | ICD-10-CM

## 2022-01-11 DIAGNOSIS — Z20822 Contact with and (suspected) exposure to covid-19: Secondary | ICD-10-CM | POA: Diagnosis not present

## 2022-01-11 DIAGNOSIS — M4800 Spinal stenosis, site unspecified: Secondary | ICD-10-CM | POA: Diagnosis present

## 2022-01-11 DIAGNOSIS — R45851 Suicidal ideations: Secondary | ICD-10-CM

## 2022-01-11 DIAGNOSIS — R079 Chest pain, unspecified: Secondary | ICD-10-CM

## 2022-01-11 DIAGNOSIS — F17210 Nicotine dependence, cigarettes, uncomplicated: Secondary | ICD-10-CM | POA: Diagnosis present

## 2022-01-11 DIAGNOSIS — Z9981 Dependence on supplemental oxygen: Secondary | ICD-10-CM | POA: Diagnosis not present

## 2022-01-11 DIAGNOSIS — N179 Acute kidney failure, unspecified: Secondary | ICD-10-CM | POA: Diagnosis present

## 2022-01-11 DIAGNOSIS — E873 Alkalosis: Secondary | ICD-10-CM | POA: Diagnosis present

## 2022-01-11 DIAGNOSIS — E669 Obesity, unspecified: Secondary | ICD-10-CM | POA: Diagnosis present

## 2022-01-11 DIAGNOSIS — I5022 Chronic systolic (congestive) heart failure: Secondary | ICD-10-CM | POA: Diagnosis not present

## 2022-01-11 DIAGNOSIS — R070 Pain in throat: Secondary | ICD-10-CM | POA: Diagnosis not present

## 2022-01-11 DIAGNOSIS — I5030 Unspecified diastolic (congestive) heart failure: Secondary | ICD-10-CM | POA: Diagnosis not present

## 2022-01-11 DIAGNOSIS — F1290 Cannabis use, unspecified, uncomplicated: Secondary | ICD-10-CM | POA: Diagnosis present

## 2022-01-11 DIAGNOSIS — E871 Hypo-osmolality and hyponatremia: Secondary | ICD-10-CM | POA: Diagnosis present

## 2022-01-11 DIAGNOSIS — Z591 Inadequate housing, unspecified: Secondary | ICD-10-CM | POA: Diagnosis not present

## 2022-01-11 DIAGNOSIS — K219 Gastro-esophageal reflux disease without esophagitis: Secondary | ICD-10-CM | POA: Diagnosis present

## 2022-01-11 DIAGNOSIS — I5033 Acute on chronic diastolic (congestive) heart failure: Secondary | ICD-10-CM | POA: Diagnosis present

## 2022-01-11 DIAGNOSIS — F259 Schizoaffective disorder, unspecified: Secondary | ICD-10-CM | POA: Diagnosis not present

## 2022-01-11 DIAGNOSIS — E876 Hypokalemia: Secondary | ICD-10-CM | POA: Diagnosis present

## 2022-01-11 DIAGNOSIS — Z91148 Patient's other noncompliance with medication regimen for other reason: Secondary | ICD-10-CM | POA: Diagnosis not present

## 2022-01-11 DIAGNOSIS — G40909 Epilepsy, unspecified, not intractable, without status epilepticus: Secondary | ICD-10-CM | POA: Diagnosis present

## 2022-01-11 DIAGNOSIS — Z5900 Homelessness unspecified: Secondary | ICD-10-CM | POA: Diagnosis not present

## 2022-01-11 DIAGNOSIS — M5430 Sciatica, unspecified side: Secondary | ICD-10-CM | POA: Diagnosis present

## 2022-01-11 DIAGNOSIS — F1910 Other psychoactive substance abuse, uncomplicated: Secondary | ICD-10-CM | POA: Diagnosis not present

## 2022-01-11 DIAGNOSIS — K5900 Constipation, unspecified: Secondary | ICD-10-CM | POA: Diagnosis present

## 2022-01-11 DIAGNOSIS — K5909 Other constipation: Secondary | ICD-10-CM | POA: Diagnosis present

## 2022-01-11 DIAGNOSIS — F1490 Cocaine use, unspecified, uncomplicated: Secondary | ICD-10-CM | POA: Diagnosis present

## 2022-01-11 DIAGNOSIS — E785 Hyperlipidemia, unspecified: Secondary | ICD-10-CM | POA: Diagnosis not present

## 2022-01-11 DIAGNOSIS — R400 Somnolence: Secondary | ICD-10-CM | POA: Diagnosis not present

## 2022-01-11 DIAGNOSIS — R748 Abnormal levels of other serum enzymes: Secondary | ICD-10-CM | POA: Diagnosis not present

## 2022-01-11 DIAGNOSIS — F1990 Other psychoactive substance use, unspecified, uncomplicated: Secondary | ICD-10-CM | POA: Diagnosis not present

## 2022-01-11 DIAGNOSIS — R9431 Abnormal electrocardiogram [ECG] [EKG]: Secondary | ICD-10-CM

## 2022-01-11 DIAGNOSIS — E8770 Fluid overload, unspecified: Secondary | ICD-10-CM | POA: Diagnosis not present

## 2022-01-11 DIAGNOSIS — Z6841 Body Mass Index (BMI) 40.0 and over, adult: Secondary | ICD-10-CM | POA: Diagnosis not present

## 2022-01-11 DIAGNOSIS — I5032 Chronic diastolic (congestive) heart failure: Secondary | ICD-10-CM | POA: Diagnosis not present

## 2022-01-11 DIAGNOSIS — Z5989 Other problems related to housing and economic circumstances: Secondary | ICD-10-CM | POA: Diagnosis not present

## 2022-01-11 DIAGNOSIS — J029 Acute pharyngitis, unspecified: Secondary | ICD-10-CM | POA: Diagnosis not present

## 2022-01-11 DIAGNOSIS — F4310 Post-traumatic stress disorder, unspecified: Secondary | ICD-10-CM | POA: Diagnosis not present

## 2022-01-11 DIAGNOSIS — E878 Other disorders of electrolyte and fluid balance, not elsewhere classified: Secondary | ICD-10-CM | POA: Diagnosis not present

## 2022-01-11 LAB — COMPREHENSIVE METABOLIC PANEL
ALANINE AMINOTRANSFERASE: 29 U/L (ref 12–45)
ALBUMIN: 4.2 g/dL (ref 3.4–5.2)
ALKALINE PHOSPHATASE: 197 U/L — ABNORMAL HIGH (ref 45–117)
ANION GAP: 15 mmol/L (ref 10–22)
ASPARTATE AMINOTRANSFERASE: 36 U/L — ABNORMAL HIGH (ref 8–34)
BILIRUBIN TOTAL: 0.5 mg/dL (ref 0.2–1.0)
BUN (UREA NITROGEN): 7 mg/dL (ref 7–18)
CALCIUM: 10.1 mg/dL (ref 8.5–10.5)
CARBON DIOXIDE: 36 mmol/L — ABNORMAL HIGH (ref 21–32)
CHLORIDE: 79 mmol/L — ABNORMAL LOW (ref 98–107)
CREATININE: 0.8 mg/dL (ref 0.4–1.2)
ESTIMATED GLOMERULAR FILT RATE: >60 mL/min (ref >60)
Glucose Random: 153 mg/dL (ref 74–160)
POTASSIUM: 2.6 mmol/L — CL (ref 3.5–5.1)
SODIUM: 131 mmol/L — ABNORMAL LOW (ref 136–145)
TOTAL PROTEIN: 7.6 g/dL (ref 6.4–8.2)

## 2022-01-11 LAB — CBC, PLATELET & DIFFERENTIAL
ABSOLUTE BASO COUNT: 0 10*3/uL (ref 0.0–0.1)
ABSOLUTE EOSINOPHIL COUNT: 0.1 10*3/uL (ref 0.0–0.8)
ABSOLUTE IMM GRAN COUNT: 0.08 10*3/uL (ref 0.00–0.10)
ABSOLUTE LYMPH COUNT: 2.5 10*3/uL (ref 0.6–5.9)
ABSOLUTE MONO COUNT: 0.6 10*3/uL (ref 0.2–1.4)
ABSOLUTE NEUTROPHIL COUNT: 10.5 10*3/uL — ABNORMAL HIGH (ref 1.6–8.3)
ABSOLUTE NRBC COUNT: 0 10*3/uL (ref 0.0–0.0)
BASOPHIL %: 0.3 % (ref 0.0–1.2)
EOSINOPHIL %: 0.7 % (ref 0.0–7.0)
HEMATOCRIT: 49.3 % — ABNORMAL HIGH (ref 34.1–44.9)
HEMOGLOBIN: 17 g/dL — ABNORMAL HIGH (ref 11.2–15.7)
IMMATURE GRANULOCYTE %: 0.6 % (ref 0.0–1.0)
LYMPHOCYTE %: 17.9 % (ref 15.0–54.0)
MEAN CORP HGB CONC: 34.5 g/dL (ref 31.0–37.0)
MEAN CORPUSCULAR HGB: 31.2 pg (ref 26.0–34.0)
MEAN CORPUSCULAR VOL: 90.5 fl (ref 80.0–100.0)
MEAN PLATELET VOLUME: 10.5 fL (ref 8.7–12.5)
MONOCYTE %: 4.6 % (ref 4.0–13.0)
NEUTROPHIL %: 75.9 % — ABNORMAL HIGH (ref 40.0–75.0)
NRBC %: 0.1 % — ABNORMAL HIGH (ref 0.0–0.0)
PLATELET COUNT: 449 10*3/uL — ABNORMAL HIGH (ref 150–400)
RBC DISTRIBUTION WIDTH STD DEV: 46.8 fL — ABNORMAL HIGH (ref 35.1–46.3)
RED BLOOD CELL COUNT: 5.45 M/uL — ABNORMAL HIGH (ref 3.90–5.20)
WHITE BLOOD CELL COUNT: 13.8 10*3/uL — ABNORMAL HIGH (ref 4.0–11.0)

## 2022-01-11 LAB — BASIC METABOLIC PANEL
ANION GAP: 13 mmol/L (ref 10–22)
BUN (UREA NITROGEN): 8 mg/dL (ref 7–18)
CALCIUM: 9.4 mg/dL (ref 8.5–10.5)
CARBON DIOXIDE: 39 mmol/L — ABNORMAL HIGH (ref 21–32)
CHLORIDE: 81 mmol/L — ABNORMAL LOW (ref 98–107)
CREATININE: 0.8 mg/dL (ref 0.4–1.2)
ESTIMATED GLOMERULAR FILT RATE: >60 mL/min (ref >60)
Glucose Random: 120 mg/dL (ref 74–160)
POTASSIUM: 2.9 mmol/L — ABNORMAL LOW (ref 3.5–5.1)
SODIUM: 132 mmol/L — ABNORMAL LOW (ref 136–145)

## 2022-01-11 LAB — COVID-19 INPATIENT: COVID-19 INPATIENT: NEGATIVE

## 2022-01-11 LAB — SERUM DRUG SCREEN
ACETAMINOPHEN: <5 ug/mL (ref 10–30)
ETHANOL: <10 mg/dL (ref 0–10)
SALICYLATE: <0.5 mg/dL (ref 3.0–20.0)

## 2022-01-11 LAB — TROPONIN T HS 1 HOUR
DELTA 1 HOUR TROPONIN T HS: 0 ng/L (ref 0–4)
TROPONIN T HS 1 HOUR RESULT: 24 ng/L — ABNORMAL HIGH (ref 0–10)

## 2022-01-11 LAB — MAGNESIUM: MAGNESIUM: 2.5 mg/dL (ref 1.6–2.6)

## 2022-01-11 LAB — TROPONIN T HS 3 HOUR
DELTA 3 HOUR TROPONIN T HS: 3 ng/L (ref 0–7)
TROPONIN T HS 3 HOUR RESULT: 21 ng/L — ABNORMAL HIGH (ref 0–10)

## 2022-01-11 LAB — HOLD BLUE TOP TUBE

## 2022-01-11 LAB — TROPONIN T HS BASELINE: TROPONIN T HS BASELINE: 24 ng/L — ABNORMAL HIGH (ref 0–10)

## 2022-01-11 LAB — THYROID SCREEN TSH REFLEX FT4: THYROID SCREEN TSH REFLEX FT4: 2.14 u[IU]/mL (ref 0.270–4.200)

## 2022-01-11 LAB — NT-PROBNP: NT-proBNP: 361 pg/mL — ABNORMAL HIGH (ref 0–125)

## 2022-01-11 MED ORDER — TORSEMIDE 20 MG PO TABS
120.0000 mg | ORAL_TABLET | Freq: Two times a day (BID) | ORAL | Status: DC
Start: 2022-01-11 — End: 2022-01-13
  Administered 2022-01-11 – 2022-01-13 (×4): 120 mg via ORAL
  Filled 2022-01-11 (×4): qty 1

## 2022-01-11 MED ORDER — LEVOTHYROXINE SODIUM 25 MCG PO TABS
25.0000 ug | ORAL_TABLET | Freq: Every morning | ORAL | Status: DC
Start: 2022-01-12 — End: 2022-01-27
  Administered 2022-01-12 – 2022-01-27 (×15): 25 ug via ORAL
  Filled 2022-01-11 (×16): qty 1

## 2022-01-11 MED ORDER — METOLAZONE 2.5 MG PO TABS
5.0000 mg | ORAL_TABLET | Freq: Every day | ORAL | Status: DC
Start: 2022-01-11 — End: 2022-01-17
  Administered 2022-01-11 – 2022-01-17 (×6): 5 mg via ORAL
  Filled 2022-01-11 (×7): qty 2

## 2022-01-11 MED ORDER — LORAZEPAM 1 MG PO TABS
1.0000 mg | ORAL_TABLET | Freq: Every evening | ORAL | Status: DC | PRN
Start: 2022-01-11 — End: 2022-01-11

## 2022-01-11 MED ORDER — POTASSIUM CHLORIDE CRYS ER 20 MEQ PO TBCR
40.00 meq | EXTENDED_RELEASE_TABLET | Freq: Once | ORAL | Status: AC
Start: 2022-01-11 — End: 2022-01-11
  Administered 2022-01-11: 40 meq via ORAL
  Filled 2022-01-11: qty 2

## 2022-01-11 MED ORDER — LORAZEPAM 1 MG PO TABS
1.0000 mg | ORAL_TABLET | Freq: Two times a day (BID) | ORAL | Status: DC | PRN
Start: 2022-01-11 — End: 2022-01-11

## 2022-01-11 MED ORDER — BUPRENORPHINE HCL-NALOXONE HCL 8-2 MG SL SUBL
8.00 mg | SUBLINGUAL_TABLET | Freq: Once | SUBLINGUAL | Status: AC
Start: 2022-01-11 — End: 2022-01-11
  Administered 2022-01-11: 1 via SUBLINGUAL
  Filled 2022-01-11: qty 1

## 2022-01-11 MED ORDER — QUETIAPINE FUMARATE 50 MG PO TABS
50.0000 mg | ORAL_TABLET | Freq: Every evening | ORAL | Status: DC
Start: 2022-01-11 — End: 2022-01-18
  Administered 2022-01-11 – 2022-01-17 (×7): 50 mg via ORAL
  Filled 2022-01-11 (×7): qty 1

## 2022-01-11 MED ORDER — ACETAMINOPHEN 325 MG PO TABS
650.0000 mg | ORAL_TABLET | Freq: Four times a day (QID) | ORAL | Status: DC | PRN
Start: 2022-01-11 — End: 2022-01-15
  Administered 2022-01-11 – 2022-01-15 (×5): 650 mg via ORAL
  Filled 2022-01-11 (×6): qty 2

## 2022-01-11 MED ORDER — METHOCARBAMOL 500 MG PO TABS
750.0000 mg | ORAL_TABLET | Freq: Three times a day (TID) | ORAL | Status: DC
Start: 2022-01-11 — End: 2022-01-12
  Administered 2022-01-11 – 2022-01-12 (×2): 750 mg via ORAL
  Filled 2022-01-11 (×3): qty 2

## 2022-01-11 MED ORDER — METHOCARBAMOL 500 MG PO TABS
750.0000 mg | ORAL_TABLET | Freq: Three times a day (TID) | ORAL | Status: DC
Start: 2022-01-11 — End: 2022-01-11

## 2022-01-11 MED ORDER — LOXAPINE SUCCINATE 25 MG PO CAPS
25.0000 mg | ORAL_CAPSULE | Freq: Two times a day (BID) | ORAL | Status: DC
Start: 2022-01-11 — End: 2022-01-27
  Administered 2022-01-11 – 2022-01-27 (×32): 25 mg via ORAL
  Filled 2022-01-11 (×32): qty 1

## 2022-01-11 MED ORDER — GABAPENTIN 300 MG PO CAPS
600.0000 mg | ORAL_CAPSULE | Freq: Three times a day (TID) | ORAL | Status: DC
Start: 2022-01-11 — End: 2022-01-13
  Administered 2022-01-11 – 2022-01-13 (×5): 600 mg via ORAL
  Filled 2022-01-11 (×5): qty 2

## 2022-01-11 MED ORDER — ATORVASTATIN CALCIUM 80 MG PO TABS
80.0000 mg | ORAL_TABLET | Freq: Every day | ORAL | Status: DC
Start: 2022-01-11 — End: 2022-01-27
  Administered 2022-01-11 – 2022-01-27 (×17): 80 mg via ORAL
  Filled 2022-01-11 (×17): qty 1

## 2022-01-11 MED ORDER — PANTOPRAZOLE SODIUM 40 MG PO TBEC
40.0000 mg | DELAYED_RELEASE_TABLET | Freq: Every day | ORAL | Status: DC
Start: 2022-01-11 — End: 2022-01-27
  Administered 2022-01-11 – 2022-01-27 (×17): 40 mg via ORAL
  Filled 2022-01-11 (×17): qty 1

## 2022-01-11 MED ORDER — LORAZEPAM 1 MG PO TABS
1.00 mg | ORAL_TABLET | Freq: Once | ORAL | Status: AC
Start: 2022-01-11 — End: 2022-01-11
  Administered 2022-01-11: 1 mg via ORAL
  Filled 2022-01-11: qty 1

## 2022-01-11 MED ORDER — LEVETIRACETAM 500 MG PO TABS
750.0000 mg | ORAL_TABLET | Freq: Two times a day (BID) | ORAL | Status: DC
Start: 2022-01-11 — End: 2022-01-27
  Administered 2022-01-11 – 2022-01-27 (×32): 750 mg via ORAL
  Filled 2022-01-11 (×32): qty 1

## 2022-01-11 MED ORDER — ACETAMINOPHEN 500 MG PO TABS
1000.00 mg | ORAL_TABLET | Freq: Once | ORAL | Status: AC
Start: 2022-01-11 — End: 2022-01-11
  Administered 2022-01-11: 1000 mg via ORAL
  Filled 2022-01-11: qty 2

## 2022-01-11 MED ORDER — NICOTINE POLACRILEX 2 MG MT GUM
4.0000 mg | CHEWING_GUM | OROMUCOSAL | Status: DC | PRN
Start: 2022-01-11 — End: 2022-01-27
  Administered 2022-01-11 – 2022-01-27 (×72): 4 mg via ORAL
  Filled 2022-01-11 (×78): qty 2

## 2022-01-11 MED ORDER — LORAZEPAM 1 MG PO TABS
1.0000 mg | ORAL_TABLET | Freq: Three times a day (TID) | ORAL | Status: DC | PRN
Start: 2022-01-11 — End: 2022-01-13
  Administered 2022-01-11 – 2022-01-13 (×4): 1 mg via ORAL
  Filled 2022-01-11 (×4): qty 1

## 2022-01-11 MED ORDER — ASPIRIN 81 MG PO CHEW
81.0000 mg | CHEWABLE_TABLET | Freq: Every day | ORAL | Status: DC
Start: 2022-01-11 — End: 2022-01-27
  Administered 2022-01-11 – 2022-01-27 (×17): 81 mg via ORAL
  Filled 2022-01-11 (×17): qty 1

## 2022-01-11 MED ORDER — FUROSEMIDE 10 MG/ML IJ SOLN
40.0000 mg | Freq: Once | INTRAMUSCULAR | Status: DC
Start: 2022-01-11 — End: 2022-01-11

## 2022-01-11 MED ORDER — LIDOCAINE 4 % EX PTCH
1.0000 | MEDICATED_PATCH | CUTANEOUS | Status: DC
Start: 2022-01-11 — End: 2022-01-14
  Administered 2022-01-11 – 2022-01-14 (×4): 1 via TOPICAL
  Filled 2022-01-11 (×4): qty 1

## 2022-01-11 MED ORDER — ALBUTEROL SULFATE HFA 108 (90 BASE) MCG/ACT IN AERS
2.0000 | INHALATION_SPRAY | Freq: Four times a day (QID) | RESPIRATORY_TRACT | Status: DC | PRN
Start: 2022-01-11 — End: 2022-01-27
  Administered 2022-01-16 – 2022-01-18 (×2): 2 via RESPIRATORY_TRACT
  Filled 2022-01-11: qty 8

## 2022-01-11 MED ORDER — AZITHROMYCIN 500 MG PO TABS
500.0000 mg | ORAL_TABLET | ORAL | Status: DC
Start: 2022-01-12 — End: 2022-01-27
  Administered 2022-01-12 – 2022-01-26 (×7): 500 mg via ORAL
  Filled 2022-01-11 (×7): qty 1

## 2022-01-11 MED ORDER — PRAZOSIN HCL 1 MG PO CAPS
3.0000 mg | ORAL_CAPSULE | Freq: Every evening | ORAL | Status: DC
Start: 2022-01-11 — End: 2022-01-27
  Administered 2022-01-11 – 2022-01-26 (×16): 3 mg via ORAL
  Filled 2022-01-11 (×16): qty 3

## 2022-01-11 MED ORDER — FLUOXETINE HCL 20 MG PO CAPS
20.0000 mg | ORAL_CAPSULE | Freq: Every day | ORAL | Status: DC
Start: 2022-01-11 — End: 2022-01-27
  Administered 2022-01-11 – 2022-01-27 (×17): 20 mg via ORAL
  Filled 2022-01-11 (×17): qty 1

## 2022-01-11 MED ORDER — BUDESONIDE-FORMOTEROL FUMARATE 160-4.5 MCG/ACT IN AERO
2.0000 | INHALATION_SPRAY | Freq: Two times a day (BID) | RESPIRATORY_TRACT | Status: DC
Start: 2022-01-11 — End: 2022-01-27
  Administered 2022-01-11 – 2022-01-27 (×31): 2 via RESPIRATORY_TRACT
  Filled 2022-01-11: qty 6

## 2022-01-11 MED ORDER — CLOTRIMAZOLE 1 % EX CREA
TOPICAL_CREAM | Freq: Two times a day (BID) | CUTANEOUS | Status: DC
Start: 2022-01-11 — End: 2022-01-27
  Filled 2022-01-11: qty 15

## 2022-01-11 MED ORDER — DICLOFENAC SODIUM 1 % EX GEL
2.0000 g | Freq: Three times a day (TID) | CUTANEOUS | Status: DC | PRN
Start: 2022-01-11 — End: 2022-01-27
  Administered 2022-01-12 – 2022-01-26 (×14): 2 g via TOPICAL
  Filled 2022-01-11: qty 50

## 2022-01-11 MED ORDER — BUPRENORPHINE HCL-NALOXONE HCL 8-2 MG SL SUBL
8.0000 mg | SUBLINGUAL_TABLET | Freq: Three times a day (TID) | SUBLINGUAL | Status: DC
Start: 2022-01-11 — End: 2022-01-27
  Administered 2022-01-11 – 2022-01-27 (×48): 1 via SUBLINGUAL
  Filled 2022-01-11 (×49): qty 1

## 2022-01-11 MED ORDER — POTASSIUM CHLORIDE 20 MEQ/15ML (10%) PO SOLN
40.00 meq | Freq: Once | ORAL | Status: AC
Start: 2022-01-11 — End: 2022-01-11
  Administered 2022-01-11: 40 meq via ORAL
  Filled 2022-01-11: qty 30

## 2022-01-11 MED ORDER — HEPARIN SODIUM (PORCINE) 5000 UNIT/ML IJ SOLN
5000.0000 [IU] | Freq: Two times a day (BID) | INTRAMUSCULAR | Status: DC
Start: 2022-01-11 — End: 2022-01-25
  Administered 2022-01-12 – 2022-01-19 (×8): 5000 [IU] via SUBCUTANEOUS
  Filled 2022-01-11 (×22): qty 1

## 2022-01-11 MED ORDER — OXCARBAZEPINE 150 MG PO TABS
150.0000 mg | ORAL_TABLET | Freq: Two times a day (BID) | ORAL | Status: DC
Start: 2022-01-11 — End: 2022-01-27
  Administered 2022-01-11 – 2022-01-27 (×32): 150 mg via ORAL
  Filled 2022-01-11 (×32): qty 1

## 2022-01-11 MED ORDER — ONDANSETRON HCL 4 MG/2ML IJ SOLN
4.00 mg | Freq: Once | INTRAMUSCULAR | Status: AC
Start: 2022-01-11 — End: 2022-01-11
  Administered 2022-01-11: 4 mg via INTRAVENOUS
  Filled 2022-01-11: qty 2

## 2022-01-11 MED ORDER — NICOTINE POLACRILEX 2 MG MT GUM
4.00 mg | CHEWING_GUM | Freq: Once | OROMUCOSAL | Status: AC
Start: 2022-01-11 — End: 2022-01-12
  Filled 2022-01-11: qty 2

## 2022-01-11 MED ORDER — NICOTINE 21 MG/24HR TD PT24
1.0000 | MEDICATED_PATCH | Freq: Every day | TRANSDERMAL | Status: DC
Start: 2022-01-11 — End: 2022-01-27
  Administered 2022-01-11 – 2022-01-27 (×18): 1 via TRANSDERMAL
  Filled 2022-01-11 (×17): qty 1

## 2022-01-11 MED ORDER — LORATADINE 10 MG PO TABS
10.0000 mg | ORAL_TABLET | Freq: Every day | ORAL | Status: DC
Start: 2022-01-11 — End: 2022-01-27
  Administered 2022-01-11 – 2022-01-27 (×17): 10 mg via ORAL
  Filled 2022-01-11 (×17): qty 1

## 2022-01-11 MED ORDER — NICOTINE POLACRILEX 2 MG MT GUM
4.00 mg | CHEWING_GUM | Freq: Once | OROMUCOSAL | Status: AC
Start: 2022-01-11 — End: 2022-01-11
  Administered 2022-01-11: 4 mg via ORAL

## 2022-01-11 NOTE — PES NOTES (Signed)
Received call from [facility], [phone] (team leader for [facility] team through [facility]). Called [name] to provide her contact information. Stated that patient was recently discharged from psychiatric unit at [facility].  Stated patient was discharged against ACCS team concerns.  Patient called Leeroy Bock stating she was not feeling well and had not taken psych medication in a few days because she hasn't been able to pick them up.

## 2022-01-11 NOTE — H&P (Addendum)
H&P NOTE   01/11/2022    PATIENT INFO: Cheryl Zimmerman, [age] year old female  DATE OF ADMISSION: 01/11/22    ROOM/BED LOCATION:  19/19-A    CHIEF COMPLAINT:   51 F with CHF, A. Fib, CHF, COPD, bronchial asthma, seizure disorder on keppta, BPD, Schizoaffective disorder, polysubstance use on buprenorphine maintenance, PTSD, multiple SA, active cocaine use, obesity, brought in by ambulance this am for chest pain and SOB.    HISTORY OF PRESENT ILLNESS:   Ms. Ceja was discharged from [facility] (4/1 - 4/5) for suicide attempt. She has been living on the streets since. She has not been able to pick up her medication due to her living and financial situation and didn't want to carry all her medications around. She has been feeling generally unwell and experiencing generalized body aches and pains for the past few week. She smoked Marijuana and snorted and smoked cocaine 2 days ago in order to relieve her body aches; however her general aches persist. She is not able to point to a specific are of concern but feels unwell and irritable.    She was woken up this morning by a strong central chest pain that traveled to her right ear and jaw and left arm. The chest pain is now isolated to the center of the chest and worsens when she walks. Additionally she experiences chills and shortness of breath. She describes it as a charlie horse in the center of her chest that does not travl. She is also experiencing SOB which has been worsening of the past 3 days. She cannot sleep lying flat, but that has been a constant issue. She explains that her ambulatory oxygen concentrator has not been functioning well. She has been coughing up dark green phlegm. She has had nausea and dry heaving but no vomiting. She knows her CHF symptoms and feels her body swelling and shortness of breath are associated with that.    She has last drank alcohol 3 days ago; she drank 2 cans.  She last used cocaine 2 days ago by  snorting and smoking.    ED Course:  -- K noted to be 2.6   -- CXR clear of acute processes, has diffuse patchy infiltrates  -- received buprenorphine-nalonone 8-2 mg SL, lorazepam 1 mg, nicotine gum 4 mg, tylenol 1,000 mg, ondansetron 4 mg, 40 mEQ potassium chloride    REVIEW OF SYSTEMS:  Negative except as noted in HPI.    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    SOCIAL HISTORY:  Is un-housed. Unemployed.   Tobacco: 3PPD    Alcohol: hx alcohol use disorder, last drank 2 cans 3 days ago  Drugs: cocaine, MJ       FAMILY HISTORY:  COPD - mother, was a heavy smoker.   Lung CA - uncle       ALLERGIES:   Review of Patient's Allergies indicates:   Bee venom               Anaphylaxis   Carbamazepine           Hives   Methylprednisolone      Dizziness, Drowsiness    Comment:Also believes syncope. Seems to tolerate             inhalational and epidural steroid.   Nutritional supplem*       Hydroxyzine             Nausea Only    MEDICATIONS PRIOR TO ADMISSION:   Prior to Admission Medications    Prescriptions Last Dose Informant Patient Reported? Taking?   FLUoxetine (PROZAC) 20 MG capsule   No No   Sig: Take 1 capsule by mouth in the morning.   Loratadine 10 MG CAPS   No No   Sig: Take 10 mg by mouth in the morning.   OTHER MEDICATION   No No   Sig: Wheelchair: Electric  For use as directed.    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 Begin with 2 LPM and monitor oxygen saturation using a pulse oximeter.  2. If oxygen saturation is below 90%, increase the flow rate by 0.5 LPM every 15-30 minutes until the desired saturation level of 90%-95% is achieved.  3. If oxygen saturation is above 95%, decrease the flow rate by 0.5 LPM every 15-30 minutes until the desired saturation level of 90%-95% is achieved.  4. Once the desired saturation level is achieved, maintain the flow rate at that level.    DX: COPD with Hypoxia, Requires continuous supplemental oxygen  ICD10: Code: J44.9 and Z99.81    SIGNED Georgianne Fick, CNP, 01/05/2022    Nurse Practitioner   Hospitalist Division  [facility]  [address] [place] [address] [place] [address]  Phone:[phone] (fastest) or [phone]   Email:[email or web address]     NPI: [id number]   OXcarbazepine (TRILEPTAL) 150 MG tablet   No No   Sig: Take 1 tablet by mouth in the morning and 1 tablet before bedtime.   QUEtiapine (SEROQUEL) 50 MG tablet   No No   Sig: Take 1 tablet by mouth at bedtime   Torsemide 60 MG TABS   No No   Sig: Take 120 mg by mouth in the morning and 120 mg in the evening.   albuterol HFA 108 (90 Base) MCG/ACT inhaler   No No   Sig: Inhale 2 puffs into the lungs every 6 (six) hours as needed for Wheezing   aspirin 81 MG chewable tablet   No No   Sig:  Take 1 tablet by mouth in the morning.   atorvastatin (LIPITOR) 80 MG tablet   No No   Sig: Take 1 tablet by mouth in the morning.    azithromycin (ZITHROMAX) 500 MG tablet   No No   Sig: Take 1 tablet by mouth 3 (three) times a week   budesonide-formoterol (SYMBICORT) 160-4.5 MCG/ACT inhaler   No No   Sig: Inhale 2 puffs into the lungs in the morning and 2 puffs before bedtime.   buprenorphine-naloxone (SUBOXONE) 8-2 MG sublingual tablet   No No   Sig: Place 1 tablet under the tongue in the morning and 1 tablet at noon and 1 tablet before bedtime. Max Daily Amount: 3 tablets. Do all this for 7 days.   clotrimazole (LOTRIMIN AF) 1 % cream   No No   Sig: Apply topically 2 (two) times daily   diclofenac (VOLTAREN) 1 % GEL Gel   No No   Sig: Apply 2 g topically 3 (three) times daily as needed (knee pain)   fluticasone (FLONASE) 50 MCG/ACT nasal spray   No No   Sig: 1 spray by Each Nostril route 2 (two) times daily as needed (congestion)   gabapentin (NEURONTIN) 300 MG capsule   No No   Sig: Take 2 capsules by mouth in the morning and 2 capsules at noon and 2 capsules before bedtime.   levETIRAcetam (KEPPRA) 750 MG tablet   No No   Sig: Take 1 tablet by mouth in the morning and 1 tablet before bedtime.   levETIRAcetam (KEPPRA) 750 MG tablet   No No   Sig: Take 1 tablet by mouth in the morning and 1 tablet before bedtime.   levothyroxine (SYNTHROID) 25 MCG tablet   No No   Sig: Take 1 tablet by mouth every morning before breakfast   lidocaine (SALONPAS) 4 % PTCH   No No   Sig: Apply 1 patch topically in the morning.   loxapine (LOXITANE) 25 MG capsule   No No   Sig: Take 1 capsule by mouth in the morning and 1 capsule before bedtime.   methocarbamol (ROBAXIN) 750 MG tablet   No No   Sig: Take 1 tablet by mouth in the morning and 1 tablet at noon and 1 tablet before bedtime.   metolazone (ZAROXOLYN) 5 MG tablet   No No   Sig: Take 1 tablet by mouth in the morning.   nicotine (NICODERM CQ) 21 MG/24HR   No No   Sig: Place 1 patch onto the skin in the morning.   nicotine polacrilex (NICORETTE) 4 MG gum   No No   Sig: Take 1 each by mouth every 2 (two)  hours as needed for Craving (Nicotine)   nicotine polacrilex (NICORETTE) 4 MG gum   No No   Sig: Take 1 each by mouth every 2 (two) hours as needed for Craving (Nicotine)   pantoprazole (PROTONIX) 40 MG tablet   No No   Sig: Take 1 tablet by mouth in the morning.   prazosin (MINIPRESS) 1 MG capsule   No No   Sig: Take 3 capsules by mouth nightly   torsemide 60 MG TABS   No No   Sig: Take 120 mg by mouth in the morning and 120 mg at noon and 120 mg before bedtime.      Facility-Administered Medications: None       VITALS:   01/11/22  0849 01/11/22  0852 01/11/22  1002 01/11/22  1102  BP:   152/94    Pulse:    85   Resp: (!) 28   17   Temp:  98.6 F (37 C)     TempSrc:  Oral     SpO2:    94%       Intake/Output last 24hours (7a-7a):   No intake/output data recorded.    PHYSICAL EXAM:  Gen: Pt drowsy on initial meeting, now alert and oriented, in mild discomfort, in NAD.  HEENT: PERRLA, EOMI, MMM.   CV: RRR. Normal S1, S2. No m/r/g.  Lungs: Normal work of breathing. CTAB. On 2L NC.  Abd: NABS. Soft, NTND.  Ext: Warm. 1+ Lower extremity edema.  Neuro: A&Ox4.    RECENT LABS:  BMP:   Recent Labs     01/11/22  0844   NA 131*   K 2.6*   CL 79*   CO2 36*   BUN 7   CREAT 0.8   GLUCOSER 153     Ca, Mg, Phos:   Recent Labs     01/11/22  0844   CA 10.1   MG 2.5     CBC:   Recent Labs     01/11/22  0844   WBC 13.8*   HGB 17.0*   HCT 49.3*   PLTA 449*   RBC 5.45*     Coagulation Labs: No results for input(s): PT, INR in the last 72 hours.    Invalid input(s): PTT  LFTs:   Recent Labs     01/11/22  0844   AST 36*   ALT 29   TBILI 0.5   ALKPHOS 197*     NT-proBNP: 361 (elevated)  Troponin: 1 hr 24; delta 0    IMAGING:   XR Chest Portable 01/11/22: Diffuse perihilar patchy opacities, blunting of L costophrenic angle.     EKG: NA    ASSESSMENT AND PLAN:  41 F with CHF, A. Fib, CHF, COPD, bronchial asthma, seizure disorder on keppta, spinal stenosis, BPD, Schizoaffective disorder, polysubstance use on buprenorphine maintenance, PTSD,  multiple SA, active cocaine use, obesity, presenting with substernal chest pressure and SOB consistent with CHF exacerbation.    #CHF #Chest Pain   Pt has 1 day of acute substernal chest pain that occurred while she was sleeping and radiates to left shoulder and jaw and worsens with exertion. She has elevated NT-proBNP and lower extremity and patchy opacities on CXR consistent with pulmonary edema secondary to CHF exacerbation. Although case is likely mild given clear lung exam, and doing well on her baseline 2L NC (although was not breathing well on ambulatory oxygen concentrator). Additional diagnosis to consider include vasospastic angina (known triggers include smoking, cocaine, alcohol, and stress which she is collectively exposed to; however, she used cocaine and 2-3 days ago and only experience symptoms today), PE (has SOB, but no asymetrical swelling or localized leg pain), COPD exacerbation (hx of smoking has hx of coughing up green phlegm, but no increased work of breathing, no cough, wheezing, tachypnea, and is 98% on baseline 2L NC), as well as psychiatric etiologies (functional chest pain - a dx of exclusion, hx doesn't fit GAD or panic attack). Do not miss includes MI (elevated troponin but 1 hour delta was stable), aortic dissection (pressures are stable), esophageal rupture (no vomiting, no radiation to back, pt stable.)  - diurese on home diuretics torsemide 120 mg, metolazone 5 mg qam, prazosin 1 mg daily  - standing weights  []  CMP     #Dyspnea #  Alkalemia   Pt has known COPD, active smoker. Reports many days of SOB that worsens with exertion as well as production of dark green phlegm. On exam she was afebrile, no tachypnea, no use of accessory muscles, no coughing, and is saturating well on 2L NC. Dusypea most likely to be component of CHF.   - Reassess after proper diuresis: metolazone 5 mg qam, torsemide 120 mg BID  - Give home budesonide-formoterol 160-4.5 MCG/ACT 2 puffs BID, albuterol 108 mcg  2 puffs q6h prn, and fluticasone prn, loratadine 10 mg qam    #Hyponatremia #Hypokalemia #Hypochloremia  Pt has new multiple electrolyte abnormalities compared to last admission. Unclear if acute or chronic in the past 3 weeks. Pt has is un-housed with unreliable foot access. She has been off of her medications for 3 weeks including diuresis. Hyponatremia, kalemia, and chloremia in context of pulmonary and peripheral edema is likely hypervolemia. Differential diagnosis also includes redistribution 2/2 sympathicomimetic use, insufficient intake.  - oral repletion of potassium chloride 40 mEq PO   - Reassess electrolytes after adequate diuresis  []  BMP    #Polycythemia  Pt has new polycythemia. Likely due to intravascular volume depletion.  -continue to monitor    #Leukocytosis  Likely multifactorial. A febrile and hemodynamically stable.  -continue to montor    #Isolated Elevated ALK PHOS  Alk phos elevated. AST mildly elevated. ALT normal. Elevation is not severe (not in x10 normal range). No jaundice and bilirubin in normal range. AST:ALT < 1.5, AST not greater than 50 IU/L making alcholic hepatitis, and alcoholic liver disease unlikely. Encouraging that alb is normal. No hx of delirium tremens.  -continue to trend    #SI   Presented with active SI without plan.   High risk due to multiple recent hospitalizations 2/2 multiple suicide attempts  - sitter  - consult for psych eval   - Suicide precautions    #Polysubstance Use #Housing Security  - consult social work  - consult recovery coach    #Chronic Concerns:  #Seizure Disorder: levetiracetam 750 mg, oxacarbazepine 150 mg BID  #CAD: Aspirin 81 daily,   #HLD: atorvastatin 80 mg,   #Chronic pain: clotrimazole 1% BID, diclofenac 1% 2g TID, Gabapentin 600 mg BID, Lidocane patch 1 qam, methocarbamol 750 mg TID  #Hyypothyroidism: levothyroxine 25 mcg with breakfast  #Schizoaffective Dsdr: Loxapine 25 mg, quetiapine 50 mg  #OUD: buprenorphine-nalonone 8-2 mg SL  #TUD:  nicotine gum 4 mg  #GERD: pantoprazole 40 mg qam      Diet: low sodium    DVT Prophylaxis: heparin 5,000 units    Medication Reconciliation: Done  Was medication list from nursing facility collected & reviewed? n/a  Changes from home medication regimen: as above.    Code Status: Full Code  Health care proxy: Data Unavailable  Data Unavailable    Dispo: to group home    [facility] Student, M2  01/11/22 4:40 PM  Pager: [phone]    Discussed with attending Dr. Tobey Bride.

## 2022-01-11 NOTE — ED Triage Note (Signed)
Pt brought in by [facility] c/o substernal sharp CP that woke her from sleep this morning. Pt also notes increased SOB over the last two days. Has not had any of her medications in over a week due to financial hardship. Reports her PCP instructed her to come to the ED for evaluation yesterday. Also reports that she may be withdrawing from suboxone. Also notes SI without plan. Hx of CHF, has not taken her lasix in a week. Reports chronic O2 use, 2lpm via NC. 97% on RA on arrival. Refused ASA from EMS.

## 2022-01-11 NOTE — Plan of Care (Signed)
Problem: Activity:  Goal: Mobility will be supported throughout the hospitalization,  Outcome: Progressing     Problem: Cognitive:  Goal: Caregivers and patients knowledge of risk factors and measures for prevention of condition will be supported throughout the hospitalization  Outcome: Progressing     Problem: Safety:  Goal: Will remain free from falls throughout the hospitalization,  Outcome: Progressing     Problem: Knowledge Deficit  Goal: Patient/S.O. demonstrates understanding of disease process, treatment plan, medications, and discharge instructions.  Description: Complete learning assessment and assess knowledge base  Outcome: Progressing     Problem: Inadequate Coping  Goal: Demonstrates ability to cope effectively  Description: Patient is able to verbalize feelings related to emotional state.  Outcome: Progressing  Goal: Verbalizes adaptive coping mechanisms  Description: Able to verbalize adaptive coping mechanisms such as physical activity, distraction, and deep breathing exercises.  Outcome: Progressing     Problem: Potential for Suicide  Goal: Remain free from self harm  Description: Assess suicide risk on admission and per hospital policy.  Assess and monitor patient's mood and behaviors which may signal an increase in suicidal potential, statements such as "I wish I were dead", acting recklessly, giving away personal possessions, or suicide notes.  Assess patient for symptoms of post-traumatic stress disorder.  Implement suicide precautions per hospital policy.  Collaborate with interdisciplinary team and initiate plan and interventions as ordered.  Outcome: Progressing

## 2022-01-11 NOTE — Narrator Note (Signed)
Critical Lab Results  Method of communication faxed at 9:58 AM. ED provider notified of K 2.8 at 9:58 AM. Response is further orders.

## 2022-01-11 NOTE — Progress Notes (Signed)
Patient being aggressive towards staff while providing care. Refusing allergy band. Patient refusing to answer most of admission questions, increasingly verbally aggressive and impulsive.

## 2022-01-11 NOTE — Prior Authorization (Signed)
Hello,    Soaanz (Torsemide) 60MG  is NOT covered/requires prior authorization    Also there are 2 different set of directions. Is the patient taking 4 tablets daily or 6 tablets? Both prescriptions are active in the patient's chart.    Please adv,  Thank you!

## 2022-01-11 NOTE — ED Notes (Signed)
Patient seen and evaluated with the PA/resident. Please see their ED Provider Note for additional details.    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  Patient states she is been experiencing chest pain since this morning-approximately 2 hours prior to arrival.  She reports that it is constant and associated with shortness of breath.  She reports she had similar pain yesterday which resolved after several hours.  She states that she has been without her medications for the past 2 weeks as she has had a difficult time obtaining from pharmacy.  She reports nausea and diaphoresis.  Specifically states she needs Ativan with Suboxone.  Patient also reports she has been without Lasix and does report worsening lower extremity swelling.  She reports continued tobacco use.  Reports she last used cocaine yesterday.     Per review of her most recent medical discharge summary, patient was admitted 4/3 - 4/5 for dyspnea and peripheral edema, attributed to OHS/OSA and suspected COPD.  No florid fluid overload on that hospitalization.  She was also reporting SI at that time and was transferred to inpatient psych after medical clearance.    Objective:  BP 124/91   Pulse 104   Temp 98.6 ?F (Oral)   Resp (!) 28   SpO2 97%   Alert, anxious.  Heart is borderline tachycardic but regular rhythm.  No significant murmur appreciated.  Lung fields are clear with no wheezing, rales or rhonchi.  Speaking in full sentences.  Mild tachypnea but appears secondary to anxiety in no acute distress.  Abdomen is soft, nontender nondistended    EKG interpreted by me: Sinus Rhythm. 87 bpm. Normal Axis. No PR Prolongation. QTc 596.  V4-V6 1 mm ST depressions, no associated ST elevation.  When  compared to EKG from 11/30/2021, this is new.  Q wave in lead III, unchanged from prior.    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  Suspect patient's chest discomfort, diaphoresis and tachypnea is likely due to anxiety and medication withdrawal.  She does not appear to be clinically fluid overloaded.  Chest x-ray shows mild pulmonary edema.  She is without significant tachypnea, hypoxia or respiratory distress.  Her EKG however does show new ST depressions in the lateral leads.  Subsequent troponin is only mildly elevated-history of elevated troponin in the past.  Heart score 6.  As such, will plan for further inpatient work-up with safety watch.  Other labs notable for hypokalemia despite Lasix noncompliance.  Renal function normal.  Ordered for repletion in the emergency department.  Also ordered for Suboxone and Ativan per patient request.  Discussed with hospitalist, stable for the floor.    Eyvonne Left, MD, MPH  Attending Physician  Emergency Department  [facility]

## 2022-01-11 NOTE — RN Shift Note (Signed)
Patient arrived to 4[address]/423 from ED around 1630. Admitted to this floor for SI precautions, 1:1 sitter in place, patients room looked over and safety precautions in place. A&OX3, anxious and fidgeting in bed, restless. On tele NSR, HR 80's-90's, denies chest pain, palpitations or SOB. Two runs on tele of vfib ?, MD notified to look at tele monitor, noted as artifact, patient denies any symptoms upon assessment, watching TV without complaints. Lungs clear bilaterally, patient on home O2 baseline 2L NC, currently on 2L NC, O2 sat >94%. Abd soft, +BS X4, tolerating regular diet. Contact guard to take steps at bedside and get to commode, placed at bedside. Patient continent of urine and bowel. Currently resting and eating dinner in bed, safety maintained.

## 2022-01-11 NOTE — Consults (Signed)
Full consultation tomorrow. Discussed with medical team and reviewed chart. Pt arrived from ED to floor after 415. Pt well known to me from prior ocnsultation. She was recently discharged from inpatient psych unit at [facility] on 4/20 and then represented to the ED today. reproting SI. She will be medically admitted for brief stabilization. Will assess safety and need for inpatient tomorrow should remain on sitter until otherwise updated.

## 2022-01-11 NOTE — ED Provider Notes (Signed)
eMERGENCY dEPARTMENT Physician Assistant NOTE    The ED nursing record was reviewed.   The prior medical records as available electronically through Epic were reviewed.  The mode of arrival was Ambulance Pro     This patient was seen with Emergency Department attending physician Dr. Toni Arthurs    CHIEF COMPLAINT    Patient presents with:  Chest Pain  Breathing Problem      HPI    Cheryl Zimmerman is a 51 year old female with a history of systolic congestive heart failure with preserved ejection fraction, depression, schizoaffective disorder, tobacco abuse, polysubstance abuse on Suboxone therapy and borderline personality disorder presenting to the ED for the evaluation of increased shortness of breath developing over the past 2 days with chest pain today.    She had called her primary care doctor with the symptoms yesterday as well.  Since hospital discharge from psychiatry on 4/20 patient has been off of her medications as she was unable to get them.    These medications include her Suboxone, Torsemide (60mg ), Keppra and Synthroid.    Used cocaine cocaine yesterday.    Embraces suicidality as she does not want to feel like this anymore.  No plan  Followed by Dr. Otilio Miu      PAST MEDICAL HISTORY    Past Medical History:  schizo: Bipolar affective ([name])  No date: Chronic systolic CHF (congestive heart failure) ([name])  No date: Depression  12/23/2021: Fall in home  12/23/2021: Gait instability  12/23/2021: Requires continuous at home supplemental oxygen  depress: Schizo affective schizophrenia ([name])  12/23/2021: Spinal stenosis of lumbar 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   claudication  No date: Substance abuse ([name])    PROBLEM LIST  Patient Active Problem List:     Dyspnea on minimal exertion     Suicidal behavior with attempted self-injury ([name])     Tobacco use disorder     Systolic congestive heart failure ([name])     Seizure disorder ([name])     Schizoaffective disorder, depressive type ([name])     Suicidal ideation     Afib ([name])      Alcohol use disorder     Borderline personality disorder ([name])     Chronic pain disorder     COPD with hypoxia ([name])     Morbid obesity ([name])     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    CURRENT MEDICATIONS      Current Facility-Administered Medications:     buprenorphine-naloxone (SUBOXONE) 8-2 MG SL tablet 1 tablet, 8 mg, Sublingual, Once, Eyvonne Left, MD    acetaminophen (TYLENOL) tablet 1,000 mg, 1,000 mg, Oral, Once, Jamiyah Dingley L, PA-C    Current Outpatient Medications:     albuterol HFA 108 (90 Base) MCG/ACT inhaler, Inhale 2 puffs into the lungs every 6 (six) hours as needed for Wheezing, Disp: 1 each, Rfl: 0    atorvastatin (LIPITOR) 80 MG tablet, Take 1 tablet by mouth in the  morning., Disp: 30 tablet, Rfl: 0    azithromycin (ZITHROMAX) 500 MG tablet, Take 1 tablet by mouth 3 (three) times a week, Disp: 12 tablet, Rfl: 0    buprenorphine-naloxone (SUBOXONE) 8-2 MG sublingual tablet, Place 1 tablet under the tongue in the morning and 1 tablet at noon and 1 tablet before bedtime. Max Daily Amount: 3 tablets. Do all this for 7 days., Disp: 21 tablet, Rfl: 0    clotrimazole (LOTRIMIN AF) 1 % cream, Apply topically 2 (two) times daily, Disp: 15 g, Rfl: 0    diclofenac (VOLTAREN) 1 % GEL Gel, Apply 2 g topically 3 (three) times daily as needed (knee pain), Disp: 100 g, Rfl: 0    FLUoxetine (PROZAC) 20 MG capsule, Take 1 capsule by mouth in the morning., Disp: 30 capsule, Rfl: 0    fluticasone (FLONASE) 50 MCG/ACT nasal spray, 1 spray by Each Nostril route  2 (two) times daily as needed (congestion), Disp: 1 each, Rfl: 0    gabapentin (NEURONTIN) 300 MG capsule, Take 2 capsules by mouth in the morning and 2 capsules at noon and 2 capsules before bedtime., Disp: 180 capsule, Rfl: 0    levETIRAcetam (KEPPRA) 750 MG tablet, Take 1 tablet by mouth in the morning and 1 tablet before bedtime., Disp: 60 tablet, Rfl: 0    lidocaine (SALONPAS) 4 % PTCH, Apply 1 patch topically in the morning., Disp: 30 patch, Rfl: 0    Loratadine 10 MG CAPS, Take 10 mg by mouth in the morning., Disp: 30 capsule, Rfl: 0    loxapine (LOXITANE) 25 MG capsule, Take 1 capsule by mouth in the morning and 1 capsule before bedtime., Disp: 60 capsule, Rfl: 0    methocarbamol (ROBAXIN) 750 MG tablet, Take 1 tablet by mouth in the morning and 1 tablet at noon and 1 tablet before bedtime., Disp: 90 tablet, Rfl: 0    metolazone (ZAROXOLYN) 5 MG tablet, Take 1 tablet by mouth in the morning., Disp: 30 tablet, Rfl: 0    nicotine (NICODERM CQ) 21 MG/24HR, Place 1 patch onto the skin in the morning., Disp: 30 patch, Rfl: 0    nicotine polacrilex (NICORETTE) 4 MG gum, Take 1 each by mouth every 2 (two) hours as needed for Craving (Nicotine), Disp: 120 each, Rfl: 0    OXcarbazepine (TRILEPTAL) 150 MG tablet, Take 1 tablet by mouth in the morning and 1 tablet before bedtime., Disp: 60 tablet, Rfl: 0    pantoprazole (PROTONIX) 40 MG tablet, Take 1 tablet by mouth in the morning., Disp: 30 tablet, Rfl: 0    prazosin (MINIPRESS) 1 MG capsule, Take 3 capsules by mouth nightly, Disp: 90 capsule, Rfl: 0    QUEtiapine (SEROQUEL) 50 MG tablet, Take 1 tablet by mouth at bedtime, Disp: 30 tablet, Rfl: 0    Torsemide 60 MG TABS, Take 120 mg by mouth in the morning and 120 mg in the evening., Disp: 120 tablet, Rfl: 0    aspirin 81 MG chewable tablet, Take 1 tablet by mouth in the morning., Disp: 30 tablet, Rfl: 0    budesonide-formoterol (SYMBICORT) 160-4.5 MCG/ACT inhaler, Inhale 2 puffs into the lungs in the  morning and 2 puffs before bedtime., Disp: 1 each, Rfl: 0    levothyroxine (SYNTHROID) 25 MCG tablet, Take 1 tablet by mouth every morning before breakfast, Disp: 30 tablet, Rfl: 0    nicotine polacrilex (NICORETTE) 4 MG gum, Take 1 each by mouth every 2 (two) hours as needed for Craving (Nicotine), Disp:  120 each, Rfl: 0    levETIRAcetam (KEPPRA) 750 MG tablet, Take 1 tablet by mouth in the morning and 1 tablet before bedtime., Disp: 60 tablet, Rfl: 0    torsemide 60 MG TABS, Take 120 mg by mouth in the morning and 120 mg at noon and 120 mg before bedtime., Disp: 120 tablet, Rfl: 0    OTHER MEDICATION, Device: Inogen 3 Oxygen Flow Rate: 2-4 LPM Titration Goal: SpO2 between 90% and 95% Instructions: 1. Begin with 2 LPM and monitor oxygen saturation using a pulse oximeter. 2. If oxygen saturation is below 90%, increase the flow rate by 0.5 LPM every 15-30 minutes until the desired saturation level of 90%-95% is achieved. 3. If oxygen saturation is above 95%, decrease the flow rate by 0.5 LPM every 15-30 minutes until the desired saturation level of 90%-95% is achieved. 4. Once the desired saturation level is achieved, maintain the flow rate at that level.  DX: COPD with Hypoxia, Requires continuous supplemental oxygen ICD10: Code: J44.9 and Z99.81  SIGNED Georgianne Fick, CNP, 01/05/2022  Nurse Practitioner  Hospitalist Division  [facility]  [address] [place] [address] [place] [address] Phone:[phone] (fastest) or [phone]  Email:[email or web address]   NPI: [id number], Disp: 1 Device, Rfl: 0    OTHER MEDICATION, Wheelchair: Electric For use as directed.  DX: Gait instability and falls due to lumbar stenosis of central canal   ICD10: R26.81, Disp: 1 Device, Rfl: 0    ALLERGIES    Review of Patient's Allergies indicates:   Bee venom               Anaphylaxis   Carbamazepine           Hives   Methylprednisolone      Dizziness, Drowsiness    Comment:Also believes syncope. Seems to tolerate             inhalational and  epidural steroid.   Nutritional supplem*       Hydroxyzine             Nausea Only    FAMILY HISTORY    No family history on file.      SOCIAL HISTORY    Social History     Socioeconomic History    Marital status: Divorced     Spouse name: Not on file    Number of children: Not on file    Years of education: Not on file    Highest education level: Not on file   Occupational History    Not on file   Tobacco Use    Smoking status: Every Day     Packs/day: 3.00     Years: 30.00     Pack years: 90.00     Types: Cigarettes     Passive exposure: Never    Smokeless tobacco: Never   Vaping Use    Vaping status: Not on file   Substance and Sexual Activity    Alcohol use: Yes    Drug use: Yes     Types: Marijuana    Sexual activity: Yes     Partners: Male   Other Topics Concern    Not on file   Social History Narrative    Not on file   Social Determinants of Health  Financial Resource Strain: Not on file  Food Insecurity: Not on file  Transportation Needs: Not on file  Physical Activity: Not on file  Stress: Not on file  Social Connections: Not on file  Intimate  Partner Violence: Not on file  Housing Stability: Not on file    REVIEW OF SYSTEMS    The pertinent positives are reviewed in the HPI above. All other systems were reviewed and are negative.    PHYSICAL EXAM      BP 152/94   Pulse 85   Temp 98.6 F (Oral)   Resp 17   SpO2 94%   GENERAL:  Well-appearing, no distress.  EYES: PERRL. EOMI. Anicteric sclera.   ENT: Oropharynx clear.  NECK:  Supple with full painless ROM at the neck  LUNGS:  Clear to auscultation bilaterally without rales, rhonchi or wheezing.   HEART:  Tachycardic rate and rhythm.  No murmurs, rubs, or gallops.   ABDOMEN:  Soft, flat, without distension.  Nontender to palpation.   MUSCULOSKELETAL:  No deformities. Well-perfused extremities with  2+ DP/PT/Rad pulses bilaterally. No cyanosis or edema.  GENITOURINARY:  No CVA tenderness.     RESULTS  Results for orders placed or performed  during the hospital encounter of 01/11/22 (from the past 24 hour(s))   CBC, Platelet & Differential    Collection Time: 01/11/22  8:44 AM   Result Value    WHITE BLOOD CELL COUNT 13.8 (H)    RED BLOOD CELL COUNT 5.45 (H)    HEMOGLOBIN 17.0 (H)    HEMATOCRIT 49.3 (H)    MEAN CORPUSCULAR VOL 90.5    MEAN CORPUSCULAR HGB 31.2    MEAN CORP HGB CONC 34.5    RBC DISTRIBUTION WIDTH STD DEV 46.8 (H)    PLATELET COUNT 449 (H)    MEAN PLATELET VOLUME 10.5    NEUTROPHIL % 75.9 (H)    IMMATURE GRANULOCYTE % 0.6    LYMPHOCYTE % 17.9    MONOCYTE % 4.6    EOSINOPHIL % 0.7    BASOPHIL % 0.3    NRBC % 0.1 (H)    ABSOLUTE NEUTROPHIL COUNT 10.5 (H)    ABSOLUTE IMM GRAN COUNT 0.08    ABSOLUTE LYMPH COUNT 2.5    ABSOLUTE MONO COUNT 0.6    ABSOLUTE EOSINOPHIL COUNT 0.1    ABSOLUTE BASO COUNT 0.0    ABSOLUTE NRBC COUNT 0.0   Serum Drug Screen    Collection Time: 01/11/22  8:44 AM   Result Value    SALICYLATE < 0.5    ACETAMINOPHEN < 5    ETHANOL < 10    Narrative    Tests added: PRO-BNP, MG by [facility]. PA-C on 01/11/22 at  0947 by [email or web address].  Confirmation of receipt of critical K of 2.6 obtained from  [facility] on EME on 01/11/22 at 0950 by [email or web address].    For verbally communicated results, the READBACK procedure  was performed by [email or web address].   Urine Drug Screen    Collection Time: 01/11/22  8:44 AM   Result Value    AMPHETAMINES URINE NEGATIVE    COCAINE METABOLITES URINE POSITIVE (A)    OPIATES URINE NEGATIVE    BENZODIAZEPINES URINE NEGATIVE    CANNABINOIDS URINE NEGATIVE    ETHANOL URINE NEGATIVE    BUPRENORPHINE SCREEN URINE POSITIVE (A)    OXYCOD SCRN URINE NEGATIVE    FENTANYL URINE NEGATIVE    METHADONE URINE NEGATIVE    SPECIMEN VALIDITY URINE CREAT 26    SPECIMEN VALIDITY URINE PH 7.1    SPEC VALIDITY SPECIFIC GRAVITY 1.010    Narrative    URINE&Urine   Troponin T HS Baseline    Collection Time: 01/11/22  8:44 AM  Result Value    TROPONIN T HS BASELINE 24 (H)   Comprehensive Metabolic Panel    Collection Time: 01/11/22  8:44 AM   Result  Value    SODIUM 131 (L)    POTASSIUM 2.6 (*L)    CHLORIDE 79 (L)    CARBON DIOXIDE 36 (H)    ANION GAP 15    CALCIUM 10.1    Glucose Random 153    BUN (UREA NITROGEN) 7    TOTAL PROTEIN 7.6    ALBUMIN 4.2    BILIRUBIN TOTAL 0.5    ALKALINE PHOSPHATASE 197 (H)    ASPARTATE AMINOTRANSFERASE 36 (H)    CREATININE 0.8    ESTIMATED GLOMERULAR FILT RATE > 60    ALANINE AMINOTRANSFERASE 29    Narrative    Tests added: PRO-BNP, MG by [facility] S. PA-C on 01/11/22 at  0947 by [email or web address].  Confirmation of receipt of critical K of 2.6 obtained from  [facility] on EME on 01/11/22 at 0950 by [email or web address].    For verbally communicated results, the READBACK procedure  was performed by SA74.   Hold Blue Top Tube    Collection Time: 01/11/22  8:44 AM   Result Value    HOLD BLUE TOP TUBE RECEIVED IN HEMATOL   Thyroid Screen TSH Reflex FT4    Collection Time: 01/11/22  8:44 AM   Result Value    THYROID SCREEN TSH REFLEX FT4 2.140    Narrative    Tests added: PRO-BNP, MG by [facility] S. PA-C on 01/11/22 at  0947 by [email or web address].  Confirmation of receipt of critical K of 2.6 obtained from  [facility] on EME on 01/11/22 at 0950 by [email or web address].    For verbally communicated results, the READBACK procedure  was performed by SA74.   NT-proBNP    Collection Time: 01/11/22  8:44 AM   Result Value    NT-proBNP 361 (H)    Narrative    Tests added: PRO-BNP, MG by [facility] S. PA-C on 01/11/22 at  0947 by [email or web address].  Confirmation of receipt of critical K of 2.6 obtained from  [facility] on EME on 01/11/22 at 0950 by [email or web address].    For verbally communicated results, the READBACK procedure  was performed by SA74.   Magnesium    Collection Time: 01/11/22  8:44 AM   Result Value    MAGNESIUM 2.5    Narrative    Tests added: PRO-BNP, MG by [facility] S. PA-C on 01/11/22 at  0947 by [email or web address].  Confirmation of receipt of critical K of 2.6 obtained from  [facility] on EME on 01/11/22 at 0950 by [email or web address].    For verbally communicated results, the READBACK procedure  was performed by SA74.   Troponin T HS 1  Hour    Collection Time: 01/11/22  9:50 AM   Result Value    TROPONIN T HS 1 HOUR RESULT 24 (H)    DELTA 1 HOUR TROPONIN T HS 0        RADIOLOGY  CXR (portable):  TECHNIQUE: Portable chest, 9:04 AM     Indication: chest pain     Comparison: 11/24/2021     FINDINGS:     Quality: Satisfactory.      Tubes/lines: None.     Lungs: A linear density in the right midlung may represent mild subsegmental atelectasis or focal thickening of the minor fissure.     Pleura: There is no pleural effusion or pneumothorax.     Heart: The cardiac  silhouette is unremarkable.     Mediastinum/hila: Unremarkable.     Bones and Soft Tissues: There is extra scoliosis. A metallic density is redemonstrated overlying the lower cervical spine and trachea.      IMPRESSION:     No acute cardiopulmonary findings on portable chest radiograph.           Reviewed and Electronically Signed by: Rosine Abe MD     Reviewed by me an with Dr. Toni Arthurs and we agree with radiology interpretation    EKG:  EKG reviewed by me and with Dr. Toni Arthurs  Initial 8:53 EKG reveals a normal sinus rhythm with a rate of 87. Normal axis, normal intervals, no heart strain and not acute ST or T wave changes indicative of ACS.   ST depressions in leads V5 and 6 which are new vompared with prior EKG from November 30, 2021     Repeat 11:10  Persistent ST depressions, no reciprocal changes.  No ST elevation    MEDICATIONS ADMINISTERED ON THIS VISIT  Orders Placed This Encounter      buprenorphine-naloxone (SUBOXONE) 8-2 MG SL tablet 1 tablet      acetaminophen (TYLENOL) tablet 1,000 mg      ondansetron (ZOFRAN) injection 4 mg      LORazepam (ATIVAN) tablet 1 mg      potassium chloride SA (K-DUR) CR tablet 40 mEq      ED COURSE & MEDICAL DECISION MAKING      I reviewed the patient's past medical history/problem list, past surgical history, medication list, social history and allergies. Pt remained hemodynamically stable during their stay in the emergency department.       ED  Decision Making & Course:   This is a 51 year old female with history of polysubstance abuse, borderline personality disorder, schizoaffective disorder, obesity and who presents to the ED for the evaluation of difficulty breathing and chest pain with recent medication noncompliance.    She is tearful and anxious on arrival.  Vital signs stable.  EKG with new ST depressions as noted above.  Cardiac work-up performed.  On my initial evaluation, patient endorsed suicidality without plan.  Safety watch initiated.    Labs notable for baseline and 1 hour troponin 24.  NT proBNP 361.  Chest x-ray without infiltrate or other abnormality.    Potassium 2.6, orally repleted.  I had ordered IV Lasix considering noncompliance with throat side although this was discontinued considering hypokalemia.    Considering ST changes and need for electrolyte repletion, patient will be admitted to medicine prior to medical clearance.    Given Zofran, Ativan, Tylenol and Suboxone with improvement of her symptoms in the ED.    She agreed to this plan        Condition: Stable  Disposition: Admit to medicine      Diagnosis:   Acute electrocardiogram changes  Hypokalemia  Suicidal ideation  Polysubstance abuse ([name])     Delia Heady, PA-C

## 2022-01-12 LAB — URINE DRUG SCREEN
AMPHETAMINES URINE: NEGATIVE ng/mL
BENZODIAZEPINES URINE: NEGATIVE ng/mL
BUPRENORPHINE SCREEN URINE: POSITIVE ng/mL — AB
CANNABINOIDS URINE: NEGATIVE ng/mL
COCAINE METABOLITES URINE: POSITIVE ng/mL — AB
ETHANOL URINE: NEGATIVE mg/dL
FENTANYL URINE: NEGATIVE ng/mL
METHADONE URINE: NEGATIVE ng/mL
OPIATES URINE: NEGATIVE ng/mL
OXYCOD SCRN URINE: NEGATIVE ng/mL
SPEC VALIDITY SPECIFIC GRAVITY: 1.01 (ref 1.003–1.035)
SPECIMEN VALIDITY URINE CREAT: 26 mg/dL (ref >20)
SPECIMEN VALIDITY URINE PH: 7.1 (ref 5.0–8.5)

## 2022-01-12 LAB — COMPREHENSIVE METABOLIC PANEL
ALANINE AMINOTRANSFERASE: 26 U/L (ref 12–45)
ALBUMIN: 4 g/dL (ref 3.4–5.2)
ALKALINE PHOSPHATASE: 187 U/L — ABNORMAL HIGH (ref 45–117)
ANION GAP: 14 mmol/L (ref 10–22)
ASPARTATE AMINOTRANSFERASE: 34 U/L (ref 8–34)
BILIRUBIN TOTAL: 0.5 mg/dL (ref 0.2–1.0)
BUN (UREA NITROGEN): 14 mg/dL (ref 7–18)
CALCIUM: 9.5 mg/dL (ref 8.5–10.5)
CARBON DIOXIDE: 37 mmol/L — ABNORMAL HIGH (ref 21–32)
CHLORIDE: 81 mmol/L — ABNORMAL LOW (ref 98–107)
CREATININE: 1.1 mg/dL (ref 0.4–1.2)
ESTIMATED GLOMERULAR FILT RATE: >60 mL/min (ref >60)
Glucose Random: 123 mg/dL (ref 74–160)
POTASSIUM: 2.8 mmol/L — ABNORMAL LOW (ref 3.5–5.1)
SODIUM: 132 mmol/L — ABNORMAL LOW (ref 136–145)
TOTAL PROTEIN: 7.4 g/dL (ref 6.4–8.2)

## 2022-01-12 LAB — CBC WITH PLATELET
ABSOLUTE NRBC COUNT: 0 10*3/uL (ref 0.0–0.0)
HEMATOCRIT: 47.8 % — ABNORMAL HIGH (ref 34.1–44.9)
HEMOGLOBIN: 15.5 g/dL (ref 11.2–15.7)
MEAN CORP HGB CONC: 32.4 g/dL (ref 31.0–37.0)
MEAN CORPUSCULAR HGB: 30.5 pg (ref 26.0–34.0)
MEAN CORPUSCULAR VOL: 93.9 fl (ref 80.0–100.0)
MEAN PLATELET VOLUME: 10.2 fL (ref 8.7–12.5)
NRBC %: 0 % (ref 0.0–0.0)
PLATELET COUNT: 363 10*3/uL (ref 150–400)
RBC DISTRIBUTION WIDTH STD DEV: 50.5 fL — ABNORMAL HIGH (ref 35.1–46.3)
RED BLOOD CELL COUNT: 5.09 M/uL (ref 3.90–5.20)
WHITE BLOOD CELL COUNT: 9.9 10*3/uL (ref 4.0–11.0)

## 2022-01-12 LAB — BASIC METABOLIC PANEL
ANION GAP: 16 mmol/L (ref 10–22)
BUN (UREA NITROGEN): 19 mg/dL — ABNORMAL HIGH (ref 7–18)
CALCIUM: 9.7 mg/dL (ref 8.5–10.5)
CARBON DIOXIDE: 34 mmol/L — ABNORMAL HIGH (ref 21–32)
CHLORIDE: 79 mmol/L — ABNORMAL LOW (ref 98–107)
CREATININE: 1.3 mg/dL — ABNORMAL HIGH (ref 0.4–1.2)
ESTIMATED GLOMERULAR FILT RATE: 50 mL/min — ABNORMAL LOW (ref >60)
Glucose Random: 106 mg/dL (ref 74–160)
POTASSIUM: 3.3 mmol/L — ABNORMAL LOW (ref 3.5–5.1)
SODIUM: 129 mmol/L — ABNORMAL LOW (ref 136–145)

## 2022-01-12 LAB — SODIUM RANDOM URINE: SODIUM RANDOM URINE: <20 mmol/L

## 2022-01-12 LAB — MAGNESIUM: MAGNESIUM: 2.3 mg/dL (ref 1.6–2.6)

## 2022-01-12 LAB — OSMOLALITY RANDOM URINE: OSMOLALITY RANDOM URINE: 89 mosm/kg (ref 50–1200)

## 2022-01-12 LAB — PHOSPHORUS: PHOSPHORUS: 5 mg/dL — ABNORMAL HIGH (ref 2.5–4.9)

## 2022-01-12 MED ORDER — POTASSIUM CHLORIDE CRYS ER 20 MEQ PO TBCR
40.0000 meq | EXTENDED_RELEASE_TABLET | Freq: Once | ORAL | Status: DC
Start: 2022-01-12 — End: 2022-01-12

## 2022-01-12 MED ORDER — SENNOSIDES 8.6 MG PO TABS
17.2000 mg | ORAL_TABLET | Freq: Every evening | ORAL | Status: DC
Start: 2022-01-12 — End: 2022-01-27
  Administered 2022-01-12 – 2022-01-26 (×15): 17.2 mg via ORAL
  Filled 2022-01-12 (×15): qty 2

## 2022-01-12 MED ORDER — METHOCARBAMOL 500 MG PO TABS
500.0000 mg | ORAL_TABLET | Freq: Three times a day (TID) | ORAL | Status: DC
Start: 2022-01-12 — End: 2022-01-27
  Administered 2022-01-12 – 2022-01-27 (×42): 500 mg via ORAL
  Filled 2022-01-12 (×45): qty 1

## 2022-01-12 MED ORDER — POTASSIUM CHLORIDE CRYS ER 20 MEQ PO TBCR
20.0000 meq | EXTENDED_RELEASE_TABLET | Freq: Once | ORAL | Status: AC
Start: 2022-01-12 — End: 2022-01-12
  Administered 2022-01-12: 20 meq via ORAL
  Filled 2022-01-12: qty 1

## 2022-01-12 MED ORDER — POTASSIUM CHLORIDE CRYS ER 20 MEQ PO TBCR
40.00 meq | EXTENDED_RELEASE_TABLET | Freq: Once | ORAL | Status: AC
Start: 2022-01-12 — End: 2022-01-12
  Administered 2022-01-12: 40 meq via ORAL
  Filled 2022-01-12: qty 2

## 2022-01-12 MED ORDER — POTASSIUM CHLORIDE 10 MEQ/100ML IV SOLN
10.0000 meq | INTRAVENOUS | Status: DC
Start: 2022-01-12 — End: 2022-01-12

## 2022-01-12 MED ORDER — POTASSIUM CHLORIDE CRYS ER 20 MEQ PO TBCR
40.0000 meq | EXTENDED_RELEASE_TABLET | Freq: Once | ORAL | Status: AC
Start: 2022-01-12 — End: 2022-01-12
  Administered 2022-01-12: 40 meq via ORAL
  Filled 2022-01-12: qty 2

## 2022-01-12 MED ORDER — POLYETHYLENE GLYCOL 3350 17 G PO PACK
17.0000 g | PACK | Freq: Every day | ORAL | Status: DC
Start: 2022-01-12 — End: 2022-01-27
  Administered 2022-01-12 – 2022-01-27 (×9): 17 g via ORAL
  Filled 2022-01-12 (×15): qty 1

## 2022-01-12 NOTE — Consults (Signed)
Reason for Consult? polysubstance use    History: Pt is 51yo female w/ PMH of COPD, polysubstance use, BPD, Schozoaffective disorder and CHF. Pt is admitted w/ Hypokalemia.     Living Situation: Lives With: Unable to assess  Type of Residence: Homeless-street  Living Setting: Homeless/transient   - Pt was previously admitted to [facility] in March 2023 and discharged on 12/17/2021. Prior to hospitalization pt was staying at [facility] group home. Once discharged pt was readmitted to [facility] until 01/06/22. When not hospitalized pt was homeless living on the street.       Additional Providers/Home Services: Home Care Services: No  -Pt is currently working w/ Hospital doctor at [employer] [phone] Leeroy Bock is working to get [facility] interview w/ pt and is working to find long-term housing options.     Substance Abuse :   - Pt has history of substance use disorder. Pt says that she used cocaine a few days ago but is interested in getting sober. Pt is looking for a CSS bed or going to a sober home.    Social Work Intervention:   Referral to [employer]	[place])   Collaboration with providers / care coordination       Plan:   - SW will continue to speak w/ [name] at [facility] and [facility] on d/c plan.     - SW will look into [facility] as option for pt upon d/c.     SW will continue to follow

## 2022-01-12 NOTE — Consults (Signed)
RC saw pt see note below:    "Cheryl Zimmerman was pretty medicated when I came to visit. I said hello and reintroduced myself because we had met before. She stated she remembered me and I asked if she was willing to go to CSS this time. She said yes but then asked if I could get her clothes because she didn't have any. She seemed to be a little more worried about clothes than further treatment at this time. RC will revisit CSS with her tomorrow."

## 2022-01-12 NOTE — Progress Notes (Signed)
Pt discussed during MDR.     SW familiar w/ pt from previous admission.     Pt is interested in getting reconnected w/ [facility] and is hoping to go to sober home at d/c. Pt requests that SW contact Worthy Keeler at [facility] [phone]) to work on d/c plan.     Pt requests clothing.     SW attempted to call Secretary/administrator but received voicemail. SW will continue to follow.

## 2022-01-12 NOTE — Progress Notes (Signed)
Readmitted this 51 y/o F for c/o CP & SI with SOB. Pt is homeless living in the street with h/o COPD, substance use & CHF. Pt's on chronic O2 use. Met at bedside, A&Ox3, independent w/ RW & she provided her CM number @ [phone], [facility] consulted. DC planning pending medical clearance & recs.     Readmission Assessment  What Were The Most Important Contributors To The Patient's Readmission: Medical disease process Pt was recently discharged from [facility] d/t similar dx. on 4/1. Had 2 hospitalizations in a month, first admission on 3/19 was d/t knee pain s/p fall..      Medical Disease Process  Explain Medical Disease Process: Progression of underlying disease  Psychiatric Disease Process  Which Psychiatric Disease Process: Other substance abuse (comment)                                         01/12/22 1034   Admission   Reviewed for admission Yes   Observation Notice delivered to patient No   Does patient have prescription drug coverage? Yes   Reason for Admission Hypokalemia   Readmission Assessment  Yes    Readmission Assessment   What Were The Most Important Contributors To The Patient's Readmission Medical disease process   Medical Disease Process   Explain Medical Disease Process Progression of underlying disease   Psychiatric Disease Process   Which Psychiatric Disease Process Other substance abuse (comment)   Mental Status Upon Admission   Mental Status Upon Admission AO   Demographics   Demographic Information Correct Yes   Patient Status   Is the patient own decision-maker? Yes   Is a Social Work consult needed for Gordon Proxy or Guardianship? No   Care Giver   Do you have a caregiver?  No   Psychosocial   Admitted From: Other (comment)  (homeless)   Challenges Adjustment to diagnosis/injury/illness   Issues Related To: No issues   Therapist, music Care Provider   Relationship Status Divorced   Publishing copy for Someone No   Providing self care at home? Yes   *Religious  Affiliation [employer]   Functional Screen   * Level of Function on Admission Independent     Mobility Devices Rolling walker   Living Situation   Lives With Alone   Type of Residence [facility]   Living Setting Homeless/transient   Anticipated Discharge Plan   Expected Discharge Date 01/14/22   Interpreter Requested No   [place]
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
## 2022-01-12 NOTE — Plan of Care (Addendum)
A&OX3, SI precautions in place, patient sleepy and unresponsive to voice or sternal rub, MD at bedside to assess and patient more awake. Medications reviewed with MD at bedside, AM po robaxin held d/t sedation. Patient taking po ativan 1mg  TID PRN for anxiety/agitation, last dose given at 0414. On tele NSR with occ. PVCs, HR 80s-120's with ambulation to bedside commode, MD paged and made aware,  denies chest pain or palpitations. Lungs clear, O2 sat 88-95% RA during the day, patient refuses to use oxygen states that"at home I do not use unless I need it", baseline 2L NC especially at night, patient can be SOB with ambulation and movement. Patient c/o constipation, Abd nontender, +distended, -flatus/belching, LBM "a week ago", MD aware miralax ordered daily. Patient eating 100% of meals. Voids in bedside commode, CG, tolerated sitting up in chair during the day >2 hours. Currently resting in bed, safety maintained.     Problem: Activity:  Goal: Mobility will be supported throughout the hospitalization,  Outcome: Progressing     Problem: Cognitive:  Goal: Caregivers and patients knowledge of risk factors and measures for prevention of condition will be supported throughout the hospitalization  Outcome: Progressing     Problem: Safety:  Goal: Will remain free from falls throughout the hospitalization,  Outcome: Progressing     Problem: Knowledge Deficit  Goal: Patient/S.O. demonstrates understanding of disease process, treatment plan, medications, and discharge instructions.  Description: Complete learning assessment and assess knowledge base  Outcome: Progressing     Problem: Inadequate Coping  Goal: Demonstrates ability to cope effectively  Description: Patient is able to verbalize feelings related to emotional state.  Outcome: Progressing  Goal: Verbalizes adaptive coping mechanisms  Description: Able to verbalize adaptive coping mechanisms such as physical activity, distraction, and deep breathing  exercises.  Outcome: Progressing     Problem: Potential for Suicide  Goal: Remain free from self harm  Description: Assess suicide risk on admission and per hospital policy.  Assess and monitor patient's mood and behaviors which may signal an increase in suicidal potential, statements such as "I wish I were dead", acting recklessly, giving away personal possessions, or suicide notes.  Assess patient for symptoms of post-traumatic stress disorder.  Implement suicide precautions per hospital policy.  Collaborate with interdisciplinary team and initiate plan and interventions as ordered.  Outcome: Progressing

## 2022-01-12 NOTE — Progress Notes (Addendum)
PROGRESS NOTE  01/12/2022    PATIENT INFO: Cheryl Zimmerman, [age] year old female  DATE OF ADMISSION: 01/11/22    ROOM/BED LOCATION:  423/423-A  HOSPITAL DAY: 1    REVIEW OF OVERNIGHT EVENTS:  None.    SUBJECTIVE:  Very somnolent this morning.   Only speaking in short sentences.  Says breathing has been a bit better.  No concerns for chest pain.    OBJECTIVE:    VITAL SIGNS      01/11/22  2058 01/11/22  2100 01/11/22  2110 01/12/22  0616   BP: 110/85  102/68 101/62   Pulse:  80 89 87   Resp:   18 18   Temp:   97.6 ?F (36.4 ?C) 98.3 ?F (36.8 ?C)   TempSrc:   Oral Oral   SpO2:   93% 92%   Weight:       Height:           INS/OUTS (PAST 24 HOURS)  I/O 24 Hrs:  In: 480 [P.O.:480]  Out: 1500 [Urine:1500]    PHYSICAL EXAM   GEN:   Drowsy but can be briefly roused with firm touch on arm and loud voice. NAD   CV:      RRR, no m/r/g  PULM:      CTAB  ABD:      Soft, normal bowel sounds, NTND  EXT:      Warm, brisk capillary refill, 1+ pitting edema.  SKIN:      No visible rash  NEURO:        Moving all extremities spontaneously     RECENT LABS  BMP:   Recent Labs     01/11/22  0844 01/11/22  1805 01/12/22  0710   NA 131* 132* 132*   K 2.6* 2.9* 2.8*   CL 79* 81* 81*   CO2 36* 39* 37*   BUN _0 CREAT 0.8 0.8 1.1   GLUCOSER 153 120 123     Ca, Mg, Phos:   Recent Labs     01/11/22  0844 01/11/22  1805 01/12/22  0710   CA 10.1 9.4 9.5   MG 2.5  --   --      CBC:   Recent Labs     01/11/22  0844 01/12/22  0710   WBC 13.8* 9.9   HCT 49.3* 47.8*   HGB 17.0* 15.5   PLTA 449* 363   RBC 5.45* 5.09     Coagulation Labs: No results for input(s): PT, INR in the last 72 hours.    Invalid input(s): PTT  LFTs:   Recent Labs     01/11/22  0844 01/12/22  0710   AST 36* 34   ALT 29 26   TBILI 0.5 0.5   ALKPHOS 197* 187*       MICROBIOLOGY REVIEW  No new microbiology results to review.    IMAGING AND OTHER STUDIES  No new imaging or other studies to review.      MEDICATIONS  See list in EPIC    ASSESSMENT & PLAN   51 F with CHF,  A. Fib, COPD, bronchial asthma, on 2L home O2, seizure disorder on keppta, BPD, Schizoaffective dsdr, polysubstance use on buprenorphine maintenance, PTSD, multiple SA, active cocaine use, obesity, admitted for SI, hypoK 2.6, substernal chest pressure, SOB consistent with CHF exacerbation, on active SI precautions.    #New Somnolence #Desaturation to 89% on 2L NC  Occurred acutely this am,  before receiving her medications. Is newly desaturated to 89%. Was difficult to arouse and spoke in few words. Was at baseline on admission; alert, chatty, and oriented yesterday. Likely hypercarbia and less likely due to medications given timing of somnolence before medication administration.  -hold methocarbamol 750 mg TID    #CHF #Chest Pain   Pt sx status in unclear due to drowsiness but in no acute distress. Hemodynamically stable. Was diuresed on home torsemide 120 mg, metolazone 5 mg qam, prazosin 1 mg with moderate effect. Is stable, saturating well, no indication to escalate, continue to diurese with NET goal minus 2-3 L.  - diurese on home diuretics torsemide 120 mg BID, metolazone 5 mg qam, prazosin 1 mg daily  - standing weights  [] BMP in PM for Crt, Potassium  ?  #Dyspnea #Alkalemia   Pt has been minimally improved with 1 day of home diuresis. Stable on 2L NC. Received home COPD medications as well. Continues to be afebrile, without tachypnea, without use of accessory muscles, and no coughing.  - Give home budesonide-formoterol 160-4.5 MCG/ACT 2 puffs BID, albuterol 108 mcg 2 puffs q6h prn, and fluticasone prn, loratadine 10 mg qam  - Continue to monitor  ?  #Hypokalemia #Hypochloremia  Pt hemodynamically stable, hemodynamic status unchanged. Received 40 mEq PO potassium chloride with moderate improvements (2.6 to 2.9). Normal magnesium.  - Potassium Chloride PO 40 mEq BID  [] Repeat BMP in PM  [] outpatient management per pharmacy: PO 40 mEq BID    #Hyponatremia  Pt continues to be hyponatremic with miniscule  improvement on home diuretic.  - continue to diurese and monitor.  - urine osm and sodium    #Polycythemia  Polycythemia resolving. Likely due to return of intravascular volume.  -continue to monitor  ?  #Leukocytosis  Normalized. A febrile and hemodynamically stable.  -continue to monitor  ?  #Isolated Elevated ALK PHOS  Alk phos trending down, likely due to bone, was walking a lot when discharged.  -continue to trend  ?  #SI   Presented with active SI without plan.   High risk due to multiple recent hospitalizations 2/2 multiple suicide attempts  - psych eval today  - Suicide precautions  ?  #Polysubstance Use #Housing Security  - consult social work  - consult recovery coach    #Chronic Concerns:  #Seizure Disorder: levetiracetam 750 mg, oxacarbazepine 150 mg BID  #CAD: Aspirin 81 daily,   #HLD: atorvastatin 80 mg,   #Chronic pain: clotrimazole 1% BID, diclofenac 1% 2g TID, Gabapentin 600 mg BID, Lidocane patch 1 qam, hold methocarbamol 750 mg TID  #Hyypothyroidism: levothyroxine 25 mcg with breakfast  #Schizoaffective Dsdr: Loxapine 25 mg, quetiapine 50 mg  #OUD: buprenorphine-nalonone 8-2 mg SL  #TUD: nicotine gum 4 mg  #GERD: pantoprazole 40 mg qam    Telemetry: No    Foley: No    FEN: low sodium diet po    Prophylaxis: heparin 5,000 units    Medication Reconciliation: Done    Code Status  Full Code    Health care proxy: Data Unavailable  Data Unavailable     Dispo: CM/SW coordinate housing plan    Stewart Student, M2  01/12/22 9:14 AM  Pager: [phone]    Discussed with attending Dr. Perlie Mayo.

## 2022-01-12 NOTE — Plan of Care (Signed)
Problem: Activity:  Goal: Mobility will be supported throughout the hospitalization,  Outcome: Progressing     Problem: Safety:  Goal: Will remain free from falls throughout the hospitalization,  Outcome: Progressing     Problem: Potential for Suicide  Goal: Remain free from self harm  Description: Assess suicide risk on admission and per hospital policy.  Assess and monitor patient's mood and behaviors which may signal an increase in suicidal potential, statements such as "I wish I were dead", acting recklessly, giving away personal possessions, or suicide notes.  Assess patient for symptoms of post-traumatic stress disorder.  Implement suicide precautions per hospital policy.  Collaborate with interdisciplinary team and initiate plan and interventions as ordered.  Outcome: Progressing    Pt alert and oriented x 4. SBA oob in room to bedside commode. Vitals stable on 2L O2 NC. Pt under constant observation due to endorsing SI. Pt mostly cooperative this shift, however refused midnight vital signs. Endorsing 7/10 generalized pain in body, medicated appropriately with tylenol with little effect. PRN ativan given for anxiety. Able to make needs known.

## 2022-01-12 NOTE — Progress Notes (Addendum)
See Med student Note for full Progress Note.    #Hypokalemia #Hypochloremia  Pt hemodynamically stable. Received 80 mEq PO potassium chloride with moderate improvements (2.6 to 2.9). Normal magnesium.  - Gave another Potassium Chloride PO 80 mEq   []  Repeat BMP in at 8PM  []  outpatient management per pharmacy: PO 40 mEq BID      Garrett, M2  01/12/22 3:22 PM  Pager: [phone]

## 2022-01-12 NOTE — Initial Assessments (Signed)
This RN received phone call from Arletha Pili, from [facility] contact [phone]. Regarding discharge planning for patient. MD made aware.

## 2022-01-13 DIAGNOSIS — R079 Chest pain, unspecified: Secondary | ICD-10-CM | POA: Diagnosis not present

## 2022-01-13 LAB — VENOUS BLOOD GAS
VBG POTASSIUM: 2.5 mmol/L — CL (ref 3.5–5.1)
VEN PERCENT OXYGEN SATURATION: 92.3 %
VENOUS BASE EXCESS: 13.9 mmol/L — ABNORMAL HIGH (ref 2–3)
VENOUS BICARBONATE: 40.2 — ABNORMAL HIGH (ref 23–28)
VENOUS FIO2: 28
VENOUS PARTIAL CARBON DIOXIDE: 54 mmHg — ABNORMAL HIGH (ref 41.0–51.0)
VENOUS PARTIAL PRESSURE OXYGEN: 59 mmHG
VENOUS PATIENTS TEMP: 98.2
VENOUS pH: 7.48 — ABNORMAL HIGH (ref 7.31–7.41)

## 2022-01-13 LAB — CBC WITH PLATELET
ABSOLUTE NRBC COUNT: 0 10*3/uL (ref 0.0–0.0)
HEMATOCRIT: 43.3 % (ref 34.1–44.9)
HEMOGLOBIN: 14.4 g/dL (ref 11.2–15.7)
MEAN CORP HGB CONC: 33.3 g/dL (ref 31.0–37.0)
MEAN CORPUSCULAR HGB: 31.2 pg (ref 26.0–34.0)
MEAN CORPUSCULAR VOL: 93.7 fl (ref 80.0–100.0)
MEAN PLATELET VOLUME: 10.6 fL (ref 8.7–12.5)
NRBC %: 0 % (ref 0.0–0.0)
PLATELET COUNT: 352 10*3/uL (ref 150–400)
RBC DISTRIBUTION WIDTH STD DEV: 50.8 fL — ABNORMAL HIGH (ref 35.1–46.3)
RED BLOOD CELL COUNT: 4.62 M/uL (ref 3.90–5.20)
WHITE BLOOD CELL COUNT: 8.6 10*3/uL (ref 4.0–11.0)

## 2022-01-13 LAB — HEPATIC FUNCTION PANEL
ALANINE AMINOTRANSFERASE: 26 U/L (ref 12–45)
ALBUMIN: 3.9 g/dL (ref 3.4–5.2)
ALKALINE PHOSPHATASE: 181 U/L — ABNORMAL HIGH (ref 45–117)
ASPARTATE AMINOTRANSFERASE: 26 U/L (ref 8–34)
BILIRUBIN DIRECT: <0.2 mg/dL (ref 0.0–0.2)
BILIRUBIN TOTAL: 0.3 mg/dL (ref 0.2–1.0)
TOTAL PROTEIN: 7.2 g/dL (ref 6.4–8.2)

## 2022-01-13 LAB — BASIC METABOLIC PANEL
ANION GAP: 15 mmol/L (ref 10–22)
BUN (UREA NITROGEN): 28 mg/dL — ABNORMAL HIGH (ref 7–18)
CALCIUM: 9.3 mg/dL (ref 8.5–10.5)
CARBON DIOXIDE: 33 mmol/L — ABNORMAL HIGH (ref 21–32)
CHLORIDE: 81 mmol/L — ABNORMAL LOW (ref 98–107)
CREATININE: 1.5 mg/dL — ABNORMAL HIGH (ref 0.4–1.2)
ESTIMATED GLOMERULAR FILT RATE: 42 mL/min — ABNORMAL LOW (ref >60)
Glucose Random: 115 mg/dL (ref 74–160)
POTASSIUM: 3 mmol/L — ABNORMAL LOW (ref 3.5–5.1)
SODIUM: 129 mmol/L — ABNORMAL LOW (ref 136–145)

## 2022-01-13 LAB — ELECTROLYTES URINE
CHLORIDE URINE RANDOM: 53 mmol/L
POTASSIUM RANDOM URINE: 58 mmol/L
SODIUM RANDOM URINE: 37 mmol/L

## 2022-01-13 LAB — EKG

## 2022-01-13 LAB — PHOSPHORUS MAGNESIUM
MAGNESIUM: 2.2 mg/dL (ref 1.6–2.6)
PHOSPHORUS: 4.8 mg/dL (ref 2.5–4.9)

## 2022-01-13 MED ORDER — POTASSIUM CHLORIDE CRYS ER 20 MEQ PO TBCR
40.00 meq | EXTENDED_RELEASE_TABLET | Freq: Once | ORAL | Status: AC
Start: 2022-01-13 — End: 2022-01-13
  Administered 2022-01-13: 40 meq via ORAL
  Filled 2022-01-13: qty 2

## 2022-01-13 MED ORDER — POTASSIUM CHLORIDE CRYS ER 20 MEQ PO TBCR
40.00 meq | EXTENDED_RELEASE_TABLET | Freq: Two times a day (BID) | ORAL | Status: AC
Start: 2022-01-13 — End: 2022-01-13
  Administered 2022-01-13 (×2): 40 meq via ORAL
  Filled 2022-01-13 (×2): qty 2

## 2022-01-13 MED ORDER — GABAPENTIN 400 MG PO CAPS
400.0000 mg | ORAL_CAPSULE | Freq: Three times a day (TID) | ORAL | Status: DC
Start: 2022-01-13 — End: 2022-01-14
  Administered 2022-01-13 – 2022-01-14 (×4): 400 mg via ORAL
  Filled 2022-01-13 (×4): qty 1

## 2022-01-13 MED ORDER — LORAZEPAM 0.5 MG PO TABS
0.5000 mg | ORAL_TABLET | Freq: Three times a day (TID) | ORAL | Status: DC | PRN
Start: 2022-01-13 — End: 2022-01-27
  Administered 2022-01-14 – 2022-01-27 (×27): 0.5 mg via ORAL
  Filled 2022-01-13 (×30): qty 1

## 2022-01-13 NOTE — Plan of Care (Addendum)
Refused morning assessments. Stated "I'm not a fucking [place] pig, why does everyone have to do assessments on me", educated on the importance of daily assessments. Pt refused. MD notified.     Alert and oriented x3. Verbally aggressive to staff, manipulative. Was on SI watch, was D/C by MD. VSS on RA. On tele, ST.     Problem: Inadequate Coping  Goal: Demonstrates ability to cope effectively  Description: Patient is able to verbalize feelings related to emotional state.  Outcome: Not Progressing  Goal: Verbalizes adaptive coping mechanisms  Description: Able to verbalize adaptive coping mechanisms such as physical activity, distraction, and deep breathing exercises.  Outcome: Not Progressing     Problem: Activity:  Goal: Mobility will be supported throughout the hospitalization,  Outcome: Progressing     Problem: Cognitive:  Goal: Caregivers and patients knowledge of risk factors and measures for prevention of condition will be supported throughout the hospitalization  Outcome: Progressing     Problem: Safety:  Goal: Will remain free from falls throughout the hospitalization,  Outcome: Progressing     Problem: Knowledge Deficit  Goal: Patient/S.O. demonstrates understanding of disease process, treatment plan, medications, and discharge instructions.  Description: Complete learning assessment and assess knowledge base  Outcome: Progressing     Problem: Potential for Suicide  Goal: Remain free from self harm  Description: Assess suicide risk on admission and per hospital policy.  Assess and monitor patient's mood and behaviors which may signal an increase in suicidal potential, statements such as "I wish I were dead", acting recklessly, giving away personal possessions, or suicide notes.  Assess patient for symptoms of post-traumatic stress disorder.  Implement suicide precautions per hospital policy.  Collaborate with interdisciplinary team and initiate plan and interventions as ordered.  Outcome: Progressing

## 2022-01-13 NOTE — Progress Notes (Signed)
Psychiatry CL Service Follow-Up Note    Prior psychiatry consult notes as well as interim history reviewed in EPIC.   ID:    Cheryl Zimmerman is a 51 years old woman with chronic mental illness who recently left her group home, rendering her homeless. Following a medical admission at [facility] where he was followed by the psychiatric consult service, she left to the streets, and came back to our ED for suicidal ideation.  Pt well known to ourservice from recent consultation and admission to inpatient psychiatry in recent months. She was discharged from the [facility] on 4/20 and then represented to the ED on ?4/25 with a  Complaint of suicideal ideation but was found to have COPD exacerbation and required brief hospitalization for medical stabilization.     Interval Events:   NAEO on chart review.   On interview, pt calm/cooperative, thought w/ irritable mood and affect.  Pt reports she feels she no longer needs 1:1 staffing or the safety tray.  She repeatedly denies SI with active plan or intent.  She feels she does not need inpatient psych admission at this time.  She agrees to contact staff if her suicidal thoughts return.  Pt states she is hoping to be connected to outpt substance use reisdent treatment after discharge.  She is expecting to receive her next check on Monday and is hoping to stay at the hospital until then,.     BP 122/63   Pulse 102   Temp 97.5 ?F (36.4 ?C) (Oral)   Resp 20   Ht 5\' 9"  (1.753 m)   Wt (!) 143 kg (315 lb 3.2 oz)   SpO2 97%   BMI 46.55 kg/m?     New Relevant Labs/Studies: none    Mental Status Exam:  Mental Status Exam   General Appearance: Dressed in hospital gown/johnny  Behavior: Cooperative;Good eye contact  Level of Consciousness: Alert  Orientation Level: Grossly Intact  Attention/Concentration: WNL  Mannerisms/Movements: Involuntary movements (b/l hand tremor)  Speech Quality and Rate: WNL  Speech Clarity: Clear  Speech Tone: Normal vocal inflection  Vocabulary/Fund  of Knowledge: WNL  Memory: Grossly intact  Thought Process & Associations: Goal-directed;Linear;Logical;Organized  Dissociative Symptoms: None  Thought Content: No abnormalities reported or observed  Delusions: None  Hallucinations: None  Suicidal Thoughts: None  Homicidal Thoughts: None  Mood: Anxious;Irritable  Affect: Congruent with mood  Judgment: Poor  Insight: Poor    Medication:  Scheduled medications:   ? potassium chloride SA  40 mEq Oral BID   ? gabapentin  400 mg Oral TID   ? methocarbamol  500 mg Oral TID   ? polyethylene glycol  17 g Oral Daily   ? sennosides  17.2 mg Oral Nightly   ? heparin (porcine)  5,000 Units Subcutaneous Q12H [place]   ? aspirin  81 mg Oral Daily   ? atorvastatin  80 mg Oral Daily   ? azithromycin  500 mg Oral Once per day on Mon Wed Fri   ? budesonide-formoterol  2 puff Inhalation BID   ? buprenorphine-naloxone  8 mg Sublingual TID   ? clotrimazole   Topical BID   ? FLUoxetine  20 mg Oral Daily   ? levETIRAcetam  750 mg Oral BID   ? levothyroxine  25 mcg Oral DAILY   ? lidocaine  1 patch Topical Q24H   ? loratadine  10 mg Oral Daily   ? loxapine  25 mg Oral BID   ? metolazone  5  mg Oral Daily   ? nicotine  1 patch Transdermal Daily   ? OXcarbazepine  150 mg Oral BID   ? pantoprazole  40 mg Oral Daily   ? prazosin  3 mg Oral Nightly   ? QUEtiapine  50 mg Oral Nightly       As needed medications:   Current Facility-Administered Medications   Medication Dose Route Frequency Last Admin   ? LORazepam  0.5 mg Oral TID PRN     ? acetaminophen  650 mg Oral Q6H PRN 650 mg at 01/13/22 0339   ? albuterol HFA  2 puff Inhalation Q6H PRN     ? diclofenac  2 g Topical TID PRN 2 g at 01/13/22 0012   ? nicotine polacrilex  4 mg Oral Q2H PRN 4 mg at 01/13/22 1131        Assessment/Impressions:   Agree w/ Dr. Michel Bickers Initial Formulation:    "51 yo WF with long standing mental health problems who self presented to the ED with complaints of SI a few days after discharged. She continues to report non  specific SI without a specific plan. She continues to want to have a sitter present with her but again is indicating she might "soon be no longer suicidal" avoiding inpatient psychiatric care. I strongly sustpect secondary gain given that she is recently homeless as a result of her declining to return to her group home."      In terms of risk, patient has numerous risk factors for suicide/inadvertent self-harm due to grave disability self including MMI, homelessness/financial issues, poor social support, hopelessness/helplessness, substance use, impulsivity/poor executive function, recent losses    These risks, though significant, are mitigated by patient'sability to contact for safety, numerous community supports, help-seeking behaviors, genuine desire to recover, ability to list things worth living for, future-oriented thinking, access to mental health care    At time of evaluation, pt is denying SI w/ active plan or intent, HI, and exhibits an ability to attend to her ADLs.  Pt does not meet section 12 criteria.  Additionally, based on Dr. Lyman Speller initial assessment, there is significant concern for secondary gain given pt's presentation as a result of declining to return to her group home.     DSM 5 Diagnoses:  Primary Psychiatric Diagnosis: major depressive disorder  Secondary Psychiatric Diagnosis:  Borderline personality disorder  Psychosocial Stressors: housing problems, financial problems    Recommendations:   1. Discontinue 1:1 and safety tray.  2. Pt does NOT meet section 12 criteria.  No psychiatric contraindication to discharge.  Pt may leave AMA  3. Recommend primary team collaborate with outpt SW Vikki Ports [phone] regarding dispo  4. Anticipated level of care upon discharge- home w/ services  5. Recommendations discussed/to be discussed with Richardean Sale, MD    Thank you for involving [place] in this patient's care. Please contact us for any new or on-going concerns.    For on-going clinical needs after  5 pm and until 8:30 am and on weekends, please page on call psychiatrist for urgent clinical matters at [phone]. Please input brief message of reason for call and location (e.g. [address], [facility] [facility]), as the psychiatrist on call is covering multiple units in the hospital.       Jos? Dom?nguez, MD MPH  01/13/22 3:15 PM  PGY-2, Adult Psychiatry  [facility]  [phone]

## 2022-01-13 NOTE — Progress Notes (Signed)
Pt discussed during MDR.     SW spoke w/ [place] [facility] [place] [phone]). Leeroy Bock is working with [facility] to schedule interview w/ pt on Wednesday 5/3. SW informed Leeroy Bock pt will most likely be d/c by then.     Leeroy Bock is hoping pt can either go to sober home, [facility] bed, or go to a partial plus program. SW/pt discussed all options, pt is open to all. Pt also open to going to [facility].     SW will call BM in the morning to check bed availability as well as work w/ Radiation protection practitioner on other alternatives.     SW will remain available.

## 2022-01-13 NOTE — Progress Notes (Signed)
PROGRESS NOTE  01/13/2022    PATIENT INFO: Cheryl Zimmerman, 8 51 year old female  DATE OF ADMISSION: 01/11/22    ROOM/BED LOCATION:  423/423-A  HOSPITAL DAY: 2    REVIEW OF OVERNIGHT EVENTS:  Continued to replete potassium overnight, got total of 220 mEq  - had received K at the time that BMP was ordered, K was 3.3 so ordered another  - got agitated about timing of nicotine gum and was raising her voice at RN, she then calmed down.   - was complaining of knee pain, initially declined tylenol then agreed to it.  - 6am: very somnolent, briefly wakes up and responds to some of my commands but not awake enough to safely take levothyroxine. (Got ativan at 3:30am, consider discontinuing this vs decreasing dose).    SUBJECTIVE:  Less somnolent, but still sleepy, arousable to sternal rub and loud voice.  Says somnolence is due to painful muscle spasms in legs that was causing her to staying up all night in the setting of muscle relaxant hold that started yesterday.    Otherwise breathing much improved compared to admission.  Chest pain only when she's yelling at the sitters and nurses; feels like punching in chest.     OBJECTIVE:    VITAL SIGNS      01/13/22  0548 01/13/22  0859 01/13/22  0900 01/13/22  0901   BP: 124/81 122/63  122/63   Pulse: 89   102   Resp: 18  20 20    Temp: 98.3 F (36.8 C)   97.5 F (36.4 C)   TempSrc: Oral   Oral   SpO2: 91%   97%   Weight:       Height:           INS/OUTS (PAST 24 HOURS)  I/O 24 Hrs:  In: 1120 [P.O.:1120]  Out: -       PHYSICAL EXAM   GEN:   Alert and oriented. In NAD  CV:      RRR, no m/r/g  PULM:      CTAB   ABD:      Soft, normal bowel sounds, NTND  EXT:      No edema 1+, moderately warm, brisk capillary refill.  SKIN:      No visible rash  NEURO:       A&Ox4. Moving all extremities spontaneously     RECENT LABS  BMP:   Recent Labs     01/11/22  0844 01/11/22  1805 01/12/22  0710 01/12/22  1800 01/13/22  0725   NA 131* 132* 132* 129* 129*   K 2.6* 2.9*  2.8* 3.3* 3.0*   CL 79* 81* 81* 79* 81*   CO2 36* 39* 37* 34* 33*   BUN 7 8 14  19* 28*   CREAT 0.8 0.8 1.1 1.3* 1.5*   GLUCOSER 153 120 123 106 115     Ca, Mg, Phos:   Recent Labs     01/11/22  0844 01/11/22  1805 01/12/22  0710 01/12/22  1800 01/13/22  0725   CA 10.1 9.4 9.5 9.7 9.3   MG 2.5  --  2.3  --  2.2   PHOS  --   --  5.0*  --  4.8     CBC:   Recent Labs     01/11/22  0844 01/12/22  0710 01/13/22  0725   WBC 13.8* 9.9 8.6   HCT 49.3* 47.8* 43.3   HGB 17.0* 15.5  14.4   PLTA 449* 363 352   RBC 5.45* 5.09 4.62     Coagulation Labs: No results for input(s): PT, INR in the last 72 hours.    Invalid input(s): PTT  LFTs:   Recent Labs     01/11/22  0844 01/12/22  0710   AST 36* 34   ALT 29 26   TBILI 0.5 0.5   ALKPHOS 197* 187*     Pancreatic Enzymes: No results for input(s): AMY, LIP in the last 72 hours.  Urinalysis:No results for input(s): UACOL, UACLA, UAGLU, UABIL, UAKET, SPEGRAVURINE, UAOCC, UAPH, UAPRO, UANIT, LEUKOCYTES in the last 72 hours.    Invalid input(s): UAOBU  Troponin: No results for input(s): TROPI in the last 72 hours.      MICROBIOLOGY REVIEW  No new microbiology results to review.    IMAGING AND OTHER STUDIES  No new imaging or other studies to review.  No Telemetry notable findings.    MEDICATIONS  See list in EPIC    ASSESSMENT & PLAN   89 F with CHF, A. Fib, COPD, bronchial asthma, on 2L home O2, seizure disorder on keppta, BPD, Schizoaffective dsdr, polysubstance use on buprenorphine maintenance, PTSD, multiple SA, active cocaine use, obesity, admitted for SI, hypoK 2.6, substernal chest pressure, SOB consistent with CHF exacerbation, on active SI precautions.    #New Somnolence #Desaturation to 89% on 2L NC  Occurred acutely yesterday am before receiving her medications. Is saturating well today and is arousable to loud voice and sternal rub. Was chatty today and active; moved from bed to chair. Somnolence less likely to be due to hypercarbia, VBG was normal. Likely due to sedating  medications in absence of cocaine stimulant. Also noted today that she was up all night due to leg spasms since methocarbamol was held, may be contributor for today. Will titrate down some sedating meds.  - Lorazepam 0.5 mg from 1 mg TID  - Gabapentin 400 mg from 600 mg TID  - Resume methocarbamol 750 mg TID    #CHF #Chest Pain  Pt reports no chest pain compared to admission except while yelling at staff. Describes pain as "someone punching me in the chest". Consistent with stable angina 2/2 exertion. She is hemodynamically stable and on statin. Was diuresed on home torsemide 120 mg, metolazone 5 mg qam, prazosin 1 mg for 2 days with good effect. No weights or I/Os to go off of.   Now with AKI, see below. Still with 1+ pitting edema, may be baseline.  - Stop torsemide 120 mg BID  - Continue metolazone 5 mg qam, prazosin 1 mg daily  - continue HLD management: atorvastatin 80 mg    #Dyspnea#Alkalemia   Pt reports improvement on 2 days of home diuresis. Alkalemia resolving; presented at 24, now 18. VBGs not concerning. Stable on 2L NC. Received home COPD medications as well. Continues to be afebrile, without tachypnea, without use of accessory muscles, and no coughing. Likely now at baseline.   - Give home budesonide-formoterol 160-4.5 MCG/ACT 2 puffs BID, albuterol 108 mcg 2 puffs q6h prn, and fluticasone prn, loratadine 10 mg qam  - Continue to monitor    #Hypokalemia #Hypochloremia  Pthemodynamically stable. Received 220 mEq PO potassium chloride throughout day. Expect a rise of 2.2 (2.9 to 4.8) but  rose to 3.3, now 3.0. With hx arrhythmia, goal is 4 or greater. Normal magnesium when checked yesterday. Now with AKI.   []  Give another 80 mEQ today; PO 40 mEq BID,  then reassess  []  Repeat BMP at 11 PM  []  outpatient managementper pharmacy: PO 40 mEq BID    #Hyponatremia  Pt continues to be hyponatremic. Stable at 129 for the past two days. Is with AKI, on diuretic.  Urine Osm < 100 (ADH absent, excreting dilute  urine) and urine Na <30. Can consider diet (tea and toast), and primary polydipsia. Still with 1+ pitting edema, so may also be excess fluid.  - Stop torsemide 120 mg BID, due to AKI  - continue to monitor    #Isolated Elevated ALK PHOS  Alk phos continues to trending down.  - continue totrend    #SI#Polysubstance Use #Housing Security  Presented with active SI without plan. High risk due to multiplerecent hospitalizations 2/2 multiplesuicideattempts. Consulted psych, SW, recovery. Psych spoke with her, describes non specific SI without a specific plan. She continues to want to have a sitter present with her but again is indicating she might "soon be no longer suicidal" avoiding inpatient psychiatric care. Strongly sustpect secondary gain given that she is recently homeless as a result of her declining to return to her group home. Retain sitter for the time being. Work with venfen team to identify placement. Disinclined to pursue [facility]. May consider [facility] alternatively. They will continue to follow. Social Work in [facility] sober home, SW continues to follow.  - Suicide precautions  - followed by SW, psych    #Polycythemia - Resolved  Polycythemia resolving, likely due to return of intravascular volume.  -continue to monitor    #Leukocytosis - Resolved  Normalized. A febrile and hemodynamically stable.  -continue to monitor    #Chronic Concerns:  #Seizure Disorder:levetiracetam 750 mg, oxacarbazepine 150 mg BID  #CAD: Aspirin 81 daily,   #HLD: atorvastatin 80 mg,  #Chronic pain: clotrimazole 1% BID, diclofenac 1% 2g TID, Gabapentin 400 mg BID, Lidocane patch 1 qam, methocarbamol 750 mg TID, Lorazepam 0.5 mg  #Hyypothyroidism: levothyroxine 25 mcg with breakfast  #Schizoaffective Dsdr: Loxapine 25 mg, quetiapine 50 mg  #OUD:buprenorphine-nalonone 8-2 mg SL  #TUD: nicotine gum 4 mg  #GERD: pantoprazole 40 mg qam    Telemetry: yes    Foley: No    FEN: low sodium diet, po    Prophylaxis: heparin 5,000  units    Medication Reconciliation: Done    Code Status  Full Code    Health care proxy: Data Unavailable  Data Unavailable     Dispo: to sober home, once found    [employer], M2  01/13/22 11:32 AM  Pager: [phone]    Discussed with attending Dr.Hathaway, Fleet Contras.

## 2022-01-13 NOTE — Plan of Care (Addendum)
Problem: Potential for Suicide  Goal: Remain free from self harm  Description: Assess suicide risk on admission and per hospital policy.  Assess and monitor patient's mood and behaviors which may signal an increase in suicidal potential, statements such as "I wish I were dead", acting recklessly, giving away personal possessions, or suicide notes.  Assess patient for symptoms of post-traumatic stress disorder.  Implement suicide precautions per hospital policy.  Collaborate with interdisciplinary team and initiate plan and interventions as ordered.  Note: Constant observation maintained.      Problem: Inadequate Coping  Goal: Demonstrates ability to cope effectively  Description: Patient is able to verbalize feelings related to emotional state.  Note: Pt lethargic earlier in the evening. When pt became more awake, started yelling and became verbally aggressive. C/o pain in BLEs, especially in knee areas. Offered Tylenol but pt declined. Later on pt accepted to have voltaren cream and fell asleep again. Pt woke up around 0300 and c/o pain again. MD notified. Will continue to monitor.     MD spoke w/ pt, and pt agreed to take tylenol for pain. Ativan PRN also given for anxiety/ agitation. Around 0540, pt lethargic again but arousable. Refused to wake up for Levothyroxine. MD made aware.

## 2022-01-14 LAB — BASIC METABOLIC PANEL
ANION GAP: 12 mmol/L (ref 10–22)
ANION GAP: 13 mmol/L (ref 10–22)
ANION GAP: 14 mmol/L (ref 10–22)
BUN (UREA NITROGEN): 27 mg/dL — ABNORMAL HIGH (ref 7–18)
BUN (UREA NITROGEN): 29 mg/dL — ABNORMAL HIGH (ref 7–18)
BUN (UREA NITROGEN): 33 mg/dL — ABNORMAL HIGH (ref 7–18)
CALCIUM: 9.4 mg/dL (ref 8.5–10.5)
CALCIUM: 9.4 mg/dL (ref 8.5–10.5)
CALCIUM: 9.6 mg/dL (ref 8.5–10.5)
CARBON DIOXIDE: 31 mmol/L (ref 21–32)
CARBON DIOXIDE: 33 mmol/L — ABNORMAL HIGH (ref 21–32)
CARBON DIOXIDE: 34 mmol/L — ABNORMAL HIGH (ref 21–32)
CHLORIDE: 84 mmol/L — ABNORMAL LOW (ref 98–107)
CHLORIDE: 84 mmol/L — ABNORMAL LOW (ref 98–107)
CHLORIDE: 86 mmol/L — ABNORMAL LOW (ref 98–107)
CREATININE: 1 mg/dL (ref 0.4–1.2)
CREATININE: 1.1 mg/dL (ref 0.4–1.2)
CREATININE: 1.3 mg/dL — ABNORMAL HIGH (ref 0.4–1.2)
ESTIMATED GLOMERULAR FILT RATE: 50 mL/min — ABNORMAL LOW (ref >60)
ESTIMATED GLOMERULAR FILT RATE: >60 mL/min (ref >60)
ESTIMATED GLOMERULAR FILT RATE: >60 mL/min (ref >60)
Glucose Random: 120 mg/dL (ref 74–160)
Glucose Random: 142 mg/dL (ref 74–160)
Glucose Random: 163 mg/dL — ABNORMAL HIGH (ref 74–160)
POTASSIUM: 2.9 mmol/L — ABNORMAL LOW (ref 3.5–5.1)
POTASSIUM: 3.1 mmol/L — ABNORMAL LOW (ref 3.5–5.1)
POTASSIUM: 3.3 mmol/L — ABNORMAL LOW (ref 3.5–5.1)
SODIUM: 129 mmol/L — ABNORMAL LOW (ref 136–145)
SODIUM: 130 mmol/L — ABNORMAL LOW (ref 136–145)
SODIUM: 131 mmol/L — ABNORMAL LOW (ref 136–145)

## 2022-01-14 LAB — CBC WITH PLATELET
ABSOLUTE NRBC COUNT: 0 10*3/uL (ref 0.0–0.0)
HEMATOCRIT: 41.8 % (ref 34.1–44.9)
HEMOGLOBIN: 14.8 g/dL (ref 11.2–15.7)
MEAN CORP HGB CONC: 35.4 g/dL (ref 31.0–37.0)
MEAN CORPUSCULAR HGB: 33.2 pg (ref 26.0–34.0)
MEAN CORPUSCULAR VOL: 93.7 fl (ref 80.0–100.0)
MEAN PLATELET VOLUME: 10.5 fL (ref 8.7–12.5)
NRBC %: 0 % (ref 0.0–0.0)
PLATELET COUNT: 372 10*3/uL (ref 150–400)
RBC DISTRIBUTION WIDTH STD DEV: 49.9 fL — ABNORMAL HIGH (ref 35.1–46.3)
RED BLOOD CELL COUNT: 4.46 M/uL (ref 3.90–5.20)
WHITE BLOOD CELL COUNT: 7.3 10*3/uL (ref 4.0–11.0)

## 2022-01-14 LAB — TROPONIN T HS RANDOM
TROPONIN T HS RANDOM: 27 ng/L — ABNORMAL HIGH (ref 0–10)
TROPONIN T HS RANDOM: 29 ng/L — ABNORMAL HIGH (ref 0–10)

## 2022-01-14 LAB — PHOSPHORUS MAGNESIUM
MAGNESIUM: 2.2 mg/dL (ref 1.6–2.6)
PHOSPHORUS: 3.9 mg/dL (ref 2.5–4.9)

## 2022-01-14 MED ORDER — SENNOSIDES 8.6 MG PO TABS
17.20 mg | ORAL_TABLET | Freq: Two times a day (BID) | ORAL | Status: AC | PRN
Start: 2022-01-14 — End: 2022-01-21
  Administered 2022-01-14: 17.2 mg via ORAL
  Filled 2022-01-14: qty 2

## 2022-01-14 MED ORDER — FLEET ENEMA 7-19 GM/118ML PR ENEM
1.0000 | ENEMA | Freq: Once | RECTAL | Status: AC
Start: 2022-01-14 — End: 2022-01-14
  Administered 2022-01-14: 1 via RECTAL
  Filled 2022-01-14: qty 1

## 2022-01-14 MED ORDER — LIDOCAINE 4 % EX PTCH
3.0000 | MEDICATED_PATCH | CUTANEOUS | Status: DC
Start: 2022-01-14 — End: 2022-01-27
  Administered 2022-01-14 – 2022-01-27 (×14): 3 via TOPICAL
  Filled 2022-01-14 (×14): qty 3

## 2022-01-14 MED ORDER — POTASSIUM CHLORIDE CRYS ER 20 MEQ PO TBCR
40.00 meq | EXTENDED_RELEASE_TABLET | Freq: Once | ORAL | Status: AC
Start: 2022-01-14 — End: 2022-01-14
  Administered 2022-01-14: 40 meq via ORAL
  Filled 2022-01-14: qty 2

## 2022-01-14 MED ORDER — POTASSIUM CHLORIDE CRYS ER 20 MEQ PO TBCR
40.00 meq | EXTENDED_RELEASE_TABLET | Freq: Once | ORAL | Status: AC
Start: 2022-01-15 — End: 2022-01-15
  Administered 2022-01-15: 40 meq via ORAL
  Filled 2022-01-14: qty 2

## 2022-01-14 MED ORDER — POTASSIUM CHLORIDE CRYS ER 20 MEQ PO TBCR
40.0000 meq | EXTENDED_RELEASE_TABLET | ORAL | Status: DC
Start: 2022-01-14 — End: 2022-01-14
  Administered 2022-01-14 (×4): 40 meq via ORAL
  Filled 2022-01-14 (×4): qty 2

## 2022-01-14 MED ORDER — NYSTATIN 100000 UNIT/GM EX POWD
Freq: Two times a day (BID) | CUTANEOUS | Status: DC
Start: 2022-01-14 — End: 2022-01-27
  Filled 2022-01-14: qty 30

## 2022-01-14 MED ORDER — POTASSIUM CHLORIDE CRYS ER 20 MEQ PO TBCR
20.0000 meq | EXTENDED_RELEASE_TABLET | ORAL | Status: DC
Start: 2022-01-14 — End: 2022-01-14

## 2022-01-14 MED ORDER — TORSEMIDE 20 MG PO TABS
60.0000 mg | ORAL_TABLET | Freq: Two times a day (BID) | ORAL | Status: DC
Start: 2022-01-14 — End: 2022-01-15
  Administered 2022-01-14 – 2022-01-15 (×2): 60 mg via ORAL
  Filled 2022-01-14 (×2): qty 3

## 2022-01-14 MED ORDER — GABAPENTIN 400 MG PO CAPS
500.0000 mg | ORAL_CAPSULE | Freq: Three times a day (TID) | ORAL | Status: DC
Start: 2022-01-14 — End: 2022-01-27
  Administered 2022-01-14 – 2022-01-27 (×37): 500 mg via ORAL
  Filled 2022-01-14 (×40): qty 1

## 2022-01-14 NOTE — Progress Notes (Signed)
CASE MANAGER REASSESSMENT NOTE      Case Reviewed with: MDR attendants     Level of Care:  IP      Next review date if insurance: Daily    Change in status from initial assessment: No    New information that will affect transition plan: N/A    Anticipated Discharge plan: Discussed pt in MDR. Medically cleared for discharge, variance day done. Pt is homeless &  has no safe dispo as pt requires O2. Will cont to follow.    Per patient choice, referrals made to:     [facility]- referral sent yesterday & will take them 24-48 hours to review clinicals if she's appropriate.

## 2022-01-14 NOTE — Plan of Care (Addendum)
Assumed care of pt at 0700. A&Ox4, VSS, denies pain. Remains on tele NSR. Given nicorette gum per request. Troponin's collected. Repleating K+ PO. Reports constipation given sch mirilax and prn senna. Independent in room. Call light within reach, able to make needs known.     18:20 pt given fleet enema with + effect    Problem: Activity:  Goal: Mobility will be supported throughout the hospitalization,  Outcome: Progressing     Problem: Cognitive:  Goal: Caregivers and patients knowledge of risk factors and measures for prevention of condition will be supported throughout the hospitalization  Outcome: Progressing     Problem: Safety:  Goal: Will remain free from falls throughout the hospitalization,  Outcome: Progressing     Problem: Knowledge Deficit  Goal: Patient/S.O. demonstrates understanding of disease process, treatment plan, medications, and discharge instructions.  Description: Complete learning assessment and assess knowledge base  Outcome: Progressing     Problem: Inadequate Coping  Goal: Demonstrates ability to cope effectively  Description: Patient is able to verbalize feelings related to emotional state.  Outcome: Progressing  Goal: Verbalizes adaptive coping mechanisms  Description: Able to verbalize adaptive coping mechanisms such as physical activity, distraction, and deep breathing exercises.  Outcome: Progressing     Problem: Potential for Suicide  Goal: Remain free from self harm  Description: Assess suicide risk on admission and per hospital policy.  Assess and monitor patient's mood and behaviors which may signal an increase in suicidal potential, statements such as "I wish I were dead", acting recklessly, giving away personal possessions, or suicide notes.  Assess patient for symptoms of post-traumatic stress disorder.  Implement suicide precautions per hospital policy.  Collaborate with interdisciplinary team and initiate plan and interventions as ordered.  Outcome: Progressing

## 2022-01-14 NOTE — Prior Authorization (Signed)
Hello,    Soaanz (Torsemide) 60MG is NOT covered/requires prior authorization    Also there are 2 different set of directions. Is the patient taking 4 tablets daily or 6 tablets? Both prescriptions are active in the patient's chart.    Please adv,  Thank you!

## 2022-01-14 NOTE — Progress Notes (Addendum)
PROGRESS NOTE  01/14/2022    PATIENT INFO: Cheryl Zimmerman, [age] year old female  DATE OF ADMISSION: 01/11/22    ROOM/BED LOCATION:  423/423-A  HOSPITAL DAY: 3    REVIEW OF OVERNIGHT EVENTS:  Pt experienced chest pain with ambulation. Trop wasn't collected till ~8, was hemolyzed. Asked to re-draw when collecting 11pm BMP, phlebotomist refused to draw the sample because patient was being "verbally abusive."  Night doc offerred to go in with the phlebotomist but she declined. Float ICU RN was able to draw the labs off her IV, Trop 29.     Portable EKG poor read but no concerning ST changes.    Had K 3.1 - ordered for the AM     f/u troponin (baseline trop 24 on admission, 3 hr 21)    SUBJECTIVE:  Pt reports she's not doing well. Has been experiencing chest pain with exertion. Most recent was yesterday pm while she was walking. She had substernal chest pain without radiation that resolved a few min after she stopped walking. Since yesterday, she notes, she has nust not been able to get enough sleep and feels unwell in her whole body due to something coming from her chest. She expressed being afraid. She is also concerned by her sweats/clammyness, they occur when she wakes up but also when she walks. Sitting in bed, she felt clammy as well. No fever N/V/D. No pain with breathing.     She hasn't had a BM in a week.   She has been drinking a lot of diet ginger-ale and urinating quite frequently; more than usual.   She has no burning or itching with urination.    She has also been experiencing HA that are worsened by light and noise.   She was not having one during interview but preferred to have the lights off.    OBJECTIVE:    VITAL SIGNS      01/14/22  0809 01/14/22  0934 01/14/22  1244 01/14/22  1318   BP:  105/72  130/81   Pulse:  90  100   Resp: 20 18 18 20    Temp:  98.5 F (36.9 C)  97.9 F (36.6 C)   TempSrc:  Oral  Oral   SpO2:  92%  91%   Weight:       Height:           INS/OUTS (PAST 24  HOURS)  I/O 24 Hrs:  In: 1100 [P.O.:1100]  Out: 600 [Urine:600]    PHYSICAL EXAM   GEN:   Diaphoretic woman in moderate discomfort.    HEENT:    JVP unclear due to increased cervical adiposity.  CV:      RRR, No S3, no murmurs or rubs  PULM:      Decreased lung sounds at base of lungs otherwise without crackles, rales, or rhonchi. No cough.  ABD:      Soft, normal bowel sounds, NTND  EXT:      1+ pitting edema lower extremities, moderately warm.  SKIN:      Clammy on back. Erythema on central chest with few dispersed 1 mm papules.  NEURO:       A&Ox4. Moving all extremities spontaneously     RECENT LABS  BMP:   Recent Labs     01/11/22  1805 01/12/22  0710 01/12/22  1800 01/13/22  0725 01/14/22  0025 01/14/22  0730   NA 132* 132* 129* 129* 130* 131*   K 2.9*  2.8* 3.3* 3.0* 3.1* 2.9*   CL 81* 81* 79* 81* 84* 84*   CO2 39* 37* 34* 33* 33* 34*   BUN 8 14 19* 28* 29* 33*   CREAT 0.8 1.1 1.3* 1.5* 1.3* 1.1   GLUCOSER 120 123 106 115 163* 120     Ca, Mg, Phos:   Recent Labs     01/11/22  1805 01/12/22  0710 01/12/22  1800 01/13/22  0725 01/14/22  0025 01/14/22  0730   CA 9.4 9.5 9.7 9.3 9.4 9.4   MG  --  2.3  --  2.2  --  2.2   PHOS  --  5.0*  --  4.8  --  3.9     CBC:   Recent Labs     01/12/22  0710 01/13/22  0725 01/14/22  0730   WBC 9.9 8.6 7.3   HCT 47.8* 43.3 41.8   HGB 15.5 14.4 14.8   PLTA 363 352 372   RBC 5.09 4.62 4.46     Coagulation Labs: No results for input(s): PT, INR in the last 72 hours.    Invalid input(s): PTT  LFTs:   Recent Labs     01/12/22  0710 01/13/22  0725   AST 34 26   ALT 26 26   TBILI 0.5 0.3   DBILI  --  < 0.2   ALKPHOS 187* 181*     Pancreatic Enzymes: No results for input(s): AMY, LIP in the last 72 hours.  Urinalysis:No results for input(s): UACOL, UACLA, UAGLU, UABIL, UAKET, SPEGRAVURINE, UAOCC, UAPH, UAPRO, UANIT, LEUKOCYTES in the last 72 hours.    Invalid input(s): UAOBU  Troponin: No results for input(s): TROPI in the last 72 hours.      MICROBIOLOGY REVIEW  No new microbiology  results to review.    IMAGING AND OTHER STUDIES  EKG: Rate 96, Regular sinus rhythm, normal axis. Known prolonged QT interval; unchanged from comparison. Known inferior wall infarct; unchanged from comparison (01-11-22).    Telemetry notable findings: none.    MEDICATIONS  See list in EPIC    ASSESSMENT & PLAN   29 F with CHF, A. Fib, COPD, bronchial asthma, on 2L home O2, seizure disorder on keppta, BPD, Schizoaffective dsdr, polysubstance use on buprenorphine maintenance, PTSD, multiple SA, active cocaine use, obesity,admitted for SI, hypoK 2.6,substernal chest pressure,SOB consistent with CHF exacerbation, on active SI precautions.    #CHF #Chest Pain  Pt reports chest pain while ambulating in hallway yesterday without radiation, similar to past episodes. She remains hemodynamically stable. Torsemide 120 mg stopped yesterday due to AKI, only on metolazone 5 mg qam, prazosin 1 mg with good urine output. Still with 1+ pitting edema, maybe baseline. Trops attempted to be collected, however pt refused due to multiple draw attempts in quick succession.However, on discussion today, she remains willing to get to the bottom of her sx. One trop resulted to 29 and 27 9 hours later. Portable EKG was poor but no specific ST or T wave changes were appreciated compared to previous (01-11-22). Other differential diagnosis include worsening CHF and ACS however, given strong picture of exertional chest pain, most likely is stable angina.   - Hold torsemide 120 mg BID; start torsemide at 60 BID  - Continue metolazone 5 mg qam, prazosin 1 mg daily  - continue HLD management: atorvastatin 80 mg  [ ]  outpatient work up and management for CAD    #Hypokalemia #Hypochloremia #Alkalemia#Metabolic Alkalosis   Hypokalemia worsening (was  at 3.0 yesterday, down to 2.9) despite stopping furosemide. Pthemodynamically stable. Received 120 mEq total PO potassium chloride throughout day, despite aggressive PO repletion, pt continues to have  potassium losses. Pt serum magnesium is normal. With hx arrhythmia, goal of K+ is 4 or greater. Pt also with metabolic alkalosis (PH > 7.45, elevated PCO2) and hypochloremia.    Differential diagnosis for potassium loss includes renal tubular disorders, vomiting/diarrhea, iatrogenic causes such as diuretics or laxatives, hyperaldosteronism, excess bicarb from PO intake, TCAs, or excess from rhabdomyolysis, beta agonist excess from meds like albuterol, congenital issues such as Bartter Syndrome, or potassium loss 2/2 metabolic or respiratory alkalemia. 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    Assessment of acid-base status reveals pt has compensated metabolic alkalemia (pH > 7.45, elevated pCO2, and elevated bicarb).   Assessment of urine chloride reveals chloride-resistant metabolic alkalemia due to urine chloride > 40 mEq/L. Pt also has high urine potassium with K+ > 30 mEq/L. The differential diagnosis for this narrow presentation includes Bartter syndrome, which usually presents in early infancy; pt has had previous admission with normal potassium, and Gitelman Syndrome which can present in late childhood and adulthood. Gitelman syndrome is caused by autosomal recessive defect in SLC12A3 gene on chr 16p that causes impaired function of thiazide-sensitive sodium-chloride cotransport in the distal convoluted tubule which causes impaired Na+ and Cl- reabsorption and causing mild RAAAS activation. Clinical presentation is highly variable and features can be similar to chronic thiazide use and include fatigue, muscle weakness, muscle cramps, spasms, hyperreflexia, perioral paresthesia, mild polyuria, +/- chondrocalcinosis (calcium deposit in cartilage), and in some cases mild hypotension. Diagnostics include the presence of metabolic alkalosis, severe hypokalemia, hypercalcemia and +/-hypomagnesemia. Pts respond normally to loop diuretics (ex. Furosemide) and have blunted response to thiazides. Confirmatory testing is  genetics. Treatment is lifelong oral potassium substitution with potassium sparing diuretics (spironolactone, amiloride).    Other differential diagnoses include vomiting (unlilely as pt does not endorse vomiting since admission), renal tubular acidosis (unlikely as she does not have clear metabolic acidosis, instead has alkalosis), use of loop diuretics such as furosemide (unlikely as we would observe loss of both K+ and Mg++; but Mg++ is wnl), hyperaldosteronism (unlikely since BP well controlled, and no hypernatremia observed), bicarbonate use (unlikely as she is not taking bicarb, is not on TCA, nor does she haveo signs of rhabdomyolysis that would cause blood pH increase, shift of H+ out of cells, and K+ into cells), beta agonist excess (unlikley as pt is has not received albuterol throughout admission; beta agonists would cause shift of K+ into cells by increasing the activity of Na+/K+ ATPase pump), Bartter Syndrome (hypochloremic metabolic acidosis; pt does have hypochloremia but has alkalosis not acidosis; and syndrome would be present at birth, pt presentation is new since admission).   []  Give 40 mEq potassium chloride q3h  []  Repeat BMPpm  []  outpatient managementper pharmacy: PO 40 mEq BID, may change      #Constipation  Pt reports not having a BM for 1-2 weeks. Already on miralax and sennosides.  No abdominal pain. Normal bowel sounds. Added sennosides 17.2 g this am without improvement.   Pt reported enema was helped in prior admission; will try enema.  - miralax 17 g, sennosides 17.2 mg  - Add sennosides 17.2 mg q12h prn for 7 days  - Add Sodium phosphate (FLEET) 7-19 GM/118ML enema      #Chronic pain  Pt with recurrence of burning shooting pain from hips/lower back to anterior thighs, to  her shins. Has known spinal stenosis with sciatica. Reduced Gabapentin due to somnolence, will titrate up for pain management.  - continue clotrimazole 1% BID, diclofenac 1% 2g TID, Lidocane patch 3  qam,methocarbamol 750 mg TID, Lorazepam 0.5 mg  - increase Gabapentin 500 mg BID    #Dyspnea  Pt is not reporting any shortness of breath today. Continues to receive gentle diuresis on metolazone 5 mg qam, prazosin 1 mg daily. Stable alkalemia at 33/34 since 4/26; presented at 36. VBG's yesterday not concerning. Stable on 2L NC; although sometimes takes it off; resp therapist was in the room and will lengthen the cord and counsel. Received home COPD medications as well. Continues to beafebrile, withouttachypnea, withoutuse of accessory muscles, andno coughing. Normal urine sodium, potassium, and chloride. Highest on the differential is respiratory etiology; has CHF.  - Give home budesonide-formoterol 160-4.5 MCG/ACT 2 puffs BID, albuterol 108 mcg 2 puffs q6h prn, and fluticasone prn, loratadine 10 mg qam  - Continue NC 2 L  - Continue diuresis as above  - Continue to monitor     #Somnolence #Desaturation to 89% on 2L NC  Today, not somnolent. Titrated down a few sedating meds yesterday. Physically active, used walker to ambulate to portable toile. Ambulated without walker to chair in corner of room, completed all without 2L NC. Was chatty.  - Lorazepam 0.5 mg from 1 mg TID  - Gabapentin 400 mg from 600 mg TID  - Continue methocarbamol 750 mg TID    #AKI (Crt baseline 0.8)  Developed AKI yesterday to Crt 1.5.   Reduced diuresis by holding torsemide 120 mg BID.   AKI resolving, back to 1.1.  -Diuresis management as above    #Hyponatremia  Pt continues to be hyponatremic but sodium slowly improving; was 129, now 131. Is with resolving AKI, on diuretics for CHF.  Urine Osm < 100 (ADH absent, excreting dilute urine) and urine Na <30. Can consider etiologies diet (tea and toast), and primary polydipsia; drinks loads of gingerale. Still with 1+ pitting edema, so may also be excess fluid.  - Diuresis management as above  - continue to trend    #Isolated Elevated ALK PHOS  Alk phos continues to trending down.   Other  transaminases and bilirubin are normal.  - continue totrend    #SI#Polysubstance Use #Housing Security  Presented with active SI without plan. High risk due to multiplerecent hospitalizations 2/2 multiplesuicideattempts. Continues to be followed by psych. Has been verbally aggressive to staff. Reassessed by psych yesterday pm and deemed safe to d/c 1:1. Working with SW for housing, planning to be interviewed for housing at [employer]. There is an understanding she many be d/c by then, contingency planning underway. Pt would like to call the sober homes to get a head start on her home search, would like reading glasses +3.00.  - Paged SW Renette Butters about reading glasses  - followed by SW, psych    #Polycythemia - Resolved  Polycythemiaresolving, likely due toreturn ofintravascular volume.  -continue to monitor    #Leukocytosis - Resolved  Normalized.A febrile and hemodynamically stable.  -continue to monitor    #Chronic Concerns:  #Seizure Disorder:levetiracetam 750 mg, oxacarbazepine 150 mg BID  #CAD: Aspirin 81 daily  #HLD: atorvastatin 80 mg,  #Hyypothyroidism: levothyroxine 25 mcg with breakfast  #Schizoaffective Dsdr: Loxapine 25 mg, quetiapine 50 mg  #OUD:buprenorphine-nalonone 8-2 mg SL  #TUD: nicotine gum 4 mg  #GERD: pantoprazole 40 mg qam    Telemetry: Yes    Foley:  No    FEN: diabetes diet, PO    Prophylaxis: heparin 5,000 units    Medication Reconciliation: Done    Code Status  Full Code    Health care proxy: Data Unavailable  Data Unavailable     Dispo: to sober home, once found      [employer], M2  01/14/22 3:40 PM  Pager: [phone]

## 2022-01-14 NOTE — Progress Notes (Signed)
Pt discussed during MDR.     SW left message for [facility] Tedra Coupe updating her on where things stand. Pt provided list of sober homes from recovery coach. Pt plans to call some of the homes.     SW met w/ pt. Pt requesting reading glasses otherwise no additional concerns. No reading glasses are available at this time.     SW will remain available

## 2022-01-14 NOTE — RN Shift Note (Signed)
NPN: patient alert, oriented x3. VSS, SR on tele, on 2L NC - RA overnight. Denies chest pain, SOB and dizziness. Trop from previous shift finally resulted. Ongoing b/l leg pain, medicated per MAR. Resting comfortably, call bell within reach.

## 2022-01-14 NOTE — Initial Assessments (Signed)
Nutrition Initial Assessment      22 F with CHF, A. Fib, COPD, bronchial asthma, seizure disorder on keppta, BPD, Schizoaffective disorder, polysubstance use on buprenorphine maintenance, PTSD, multiple SA, active cocaine use, obesity, brought in by ambulance this am for chest pain and SOB. She has had nausea and dry heaving but no vomiting. Also having hypokalemia. Refused morning assessments yesterday 4/27.     Met with pt at bedside, pt somewhat sleepy. Pt endorses wt loss from fluid changes, said UBW is 200-some lbs. Endorses some nausea, no vomiting or diarrhea. Has constipation, said she hasn't had BM since admission but per chart LBM 4/26. Said her appetite is good, no change since before admission, eating well in hospital. Michela Pitcher she normally eats 1 meal/day outside of hospital, has been for months. Likes fruit, yogurt, and hard boiled eggs. Provided education on CHF nutrition therapy and discussed not adding salt to meals and eating more fresh/less processed foods.     Nutrition related labs: Low Na 131, low K 2.9, low Cl 84, high CO2 34, high BUN 33  Nutrition related meds: atorvastatin 80 mg, heparin 5000 units, metolazone 5 mg, pantoprazole 40 mg, miralax 17 g, sennosides 17.2 mg, K cl 40 mEq, torsemide 120 mg d/c  Fluid status: +2 RLE, LLE edema   BMs:  LBM 4/26  Skin integrity:  Intact, rash   Documented PO intake: 100% 4/25; 100% 100%, 100% 4/26; 100% 4/27      Past Medical History:  schizo: Bipolar affective ([place])  No date: Chronic systolic CHF (congestive heart failure) ([place])  No date: Depression  12/23/2021: Fall in home  12/23/2021: Gait instability  12/23/2021: Requires continuous at home supplemental oxygen  depress: Schizo affective schizophrenia ([place])  12/23/2021: Spinal stenosis of lumbar 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   claudication  No date: Substance abuse ([name])                                                                            Anthropometrics  Height: _0  (175.3 cm)  Weight: (!) 143 kg (315 lb  3.2 oz)  BMI (Calculated): 46.64  IBW/kg (Calculated): 66.2      Most Recent Weight Reading(s)  01/11/22 : (!) 143 kg (315 lb 3.2 oz)  01/01/22 : (!) 152.1 kg (335 lb 6.4 oz)  12/21/21 : (!) 159.9 kg (352 lb 8 oz)  12/13/21 : (!) 165.8 kg (365 lb 9.6 oz)  12/02/21 : (!) 145.2 kg (320 lb)  12/02/21 : (!) 150.1 kg (331 lb)    6% wt loss x 10 days, clinically significant related to fluid loss from diuresis       Current Diet:   Orders Placed This Encounter      Diet - Base Diet: Regular              Skin Integrity: Rash  Skin Location: under breast      Recent labs:  Lab Results   Component Value Date    CA 9.4 01/14/2022    NA 131 (L) 01/14/2022    K 2.9 (L) 01/14/2022    CL 84 (L) 01/14/2022    MG 2.2 01/14/2022    CO2 34 (H) 01/14/2022  PHOS 3.9 01/14/2022    BUN 33 (H) 01/14/2022    CREAT 1.1 01/14/2022    GLUCOSER 120 01/14/2022     Lab Results   Component Value Date    WBC 7.3 01/14/2022    HGB 14.8 01/14/2022    HCT 41.8 01/14/2022    PLTA 372 01/14/2022    RBC 4.46 01/14/2022    ALBUMIN 3.9 01/13/2022       Scheduled Medications:  ? torsemide  60 mg Oral BID   ? potassium chloride SA  40 mEq Oral Q3H   ? gabapentin  400 mg Oral TID   ? methocarbamol  500 mg Oral TID   ? polyethylene glycol  17 g Oral Daily   ? sennosides  17.2 mg Oral Nightly   ? heparin (porcine)  5,000 Units Subcutaneous Q12H [place]   ? aspirin  81 mg Oral Daily   ? atorvastatin  80 mg Oral Daily   ? azithromycin  500 mg Oral Once per day on Mon Wed Fri   ? budesonide-formoterol  2 puff Inhalation BID   ? buprenorphine-naloxone  8 mg Sublingual TID   ? clotrimazole   Topical BID   ? FLUoxetine  20 mg Oral Daily   ? levETIRAcetam  750 mg Oral BID   ? levothyroxine  25 mcg Oral DAILY   ? lidocaine  1 patch Topical Q24H   ? loratadine  10 mg Oral Daily   ? loxapine  25 mg Oral BID   ? metolazone  5 mg Oral Daily   ? nicotine  1 patch Transdermal Daily   ? OXcarbazepine  150 mg Oral BID   ? pantoprazole  40 mg Oral Daily   ? prazosin  3 mg Oral  Nightly   ? QUEtiapine  50 mg Oral Nightly     PRN Medications: sennosides, LORazepam, acetaminophen, albuterol HFA, diclofenac, nicotine polacrilex    Estimated Needs  Kcals:  (4401-0272 kcal)  Protein (g):  (93.7-119 g)  Fluid (ml):  (1.4-1.9 L/day)  Needs based on: Kcal/kg ;Gm/kg;mL/kg (25-30 kcal/kg, 1.1-1.4 g/kg, 1.4-1.9 L/d using ABW 85.2 kg)    Nutrition Diagnosis           Nutrition Risk Level Initial: Moderate risk Follow Up: Low risk   Follow Up Date Nutrition Follow Up Date: 01/21/22 (eating well, provided CHF edu, CTM PO intake, weight changes, edu needs)    [name] (Problem Etiology Signs/ Symptoms) Statement:  Decreased nutrient needs (sodium) related to cardiac dysfunction as evidenced by CHF, ongoing diuresis, and wt loss 6% x 10 days due to fluid shifts.       Interventions:                                        1. Recommend/Continue regular diet.  2. Provided CHF nutrition therapy education.   3. Check lytes (Na, K, Mg) -- replete PRN   4. Obtain regular weights to trend.     Monitoring and evaluation:  ? PO intake  ? Weights  ? Volume status  ? Response to diet education provided  ? Lytes  ? Labs as ordered    Goals:  ? Pt to consume >75% most meals  ? Lytes wnl  ? Fluid balance  ? Pt understanding of diet education provided      Name Cheryl Zimmerman      Date 01/14/2022  Time 11:49 AM

## 2022-01-15 DIAGNOSIS — R748 Abnormal levels of other serum enzymes: Secondary | ICD-10-CM

## 2022-01-15 DIAGNOSIS — F1990 Other psychoactive substance use, unspecified, uncomplicated: Secondary | ICD-10-CM

## 2022-01-15 DIAGNOSIS — I5033 Acute on chronic diastolic (congestive) heart failure: Principal | ICD-10-CM

## 2022-01-15 DIAGNOSIS — E876 Hypokalemia: Secondary | ICD-10-CM | POA: Diagnosis not present

## 2022-01-15 DIAGNOSIS — E871 Hypo-osmolality and hyponatremia: Secondary | ICD-10-CM

## 2022-01-15 DIAGNOSIS — E873 Alkalosis: Secondary | ICD-10-CM | POA: Diagnosis not present

## 2022-01-15 DIAGNOSIS — R45851 Suicidal ideations: Secondary | ICD-10-CM | POA: Diagnosis not present

## 2022-01-15 DIAGNOSIS — E878 Other disorders of electrolyte and fluid balance, not elsewhere classified: Secondary | ICD-10-CM

## 2022-01-15 DIAGNOSIS — K5900 Constipation, unspecified: Secondary | ICD-10-CM

## 2022-01-15 DIAGNOSIS — G8929 Other chronic pain: Secondary | ICD-10-CM

## 2022-01-15 DIAGNOSIS — N179 Acute kidney failure, unspecified: Secondary | ICD-10-CM

## 2022-01-15 LAB — BASIC METABOLIC PANEL
ANION GAP: 15 mmol/L (ref 10–22)
BUN (UREA NITROGEN): 30 mg/dL — ABNORMAL HIGH (ref 7–18)
CALCIUM: 9.9 mg/dL (ref 8.5–10.5)
CARBON DIOXIDE: 30 mmol/L (ref 21–32)
CHLORIDE: 90 mmol/L — ABNORMAL LOW (ref 98–107)
CREATININE: 1.2 mg/dL (ref 0.4–1.2)
ESTIMATED GLOMERULAR FILT RATE: 55 mL/min — ABNORMAL LOW (ref >60)
Glucose Random: 117 mg/dL (ref 74–160)
POTASSIUM: 3.6 mmol/L (ref 3.5–5.1)
SODIUM: 135 mmol/L — ABNORMAL LOW (ref 136–145)

## 2022-01-15 LAB — PHOSPHORUS MAGNESIUM
MAGNESIUM: 2 mg/dL (ref 1.6–2.6)
PHOSPHORUS: 3.3 mg/dL (ref 2.5–4.9)

## 2022-01-15 LAB — HOLD PURPLE TOP TUBE

## 2022-01-15 MED ORDER — POTASSIUM CHLORIDE CRYS ER 20 MEQ PO TBCR
40.00 meq | EXTENDED_RELEASE_TABLET | Freq: Once | ORAL | Status: AC
Start: 2022-01-15 — End: 2022-01-15
  Administered 2022-01-15: 40 meq via ORAL
  Filled 2022-01-15: qty 2

## 2022-01-15 MED ORDER — ACETAMINOPHEN 500 MG PO TABS
1000.0000 mg | ORAL_TABLET | Freq: Four times a day (QID) | ORAL | Status: DC
Start: 2022-01-15 — End: 2022-01-27
  Administered 2022-01-15 – 2022-01-27 (×38): 1000 mg via ORAL
  Filled 2022-01-15 (×44): qty 2

## 2022-01-15 MED ORDER — POTASSIUM CHLORIDE CRYS ER 20 MEQ PO TBCR
20.0000 meq | EXTENDED_RELEASE_TABLET | Freq: Once | ORAL | Status: DC
Start: 2022-01-15 — End: 2022-01-15

## 2022-01-15 NOTE — Plan of Care (Signed)
Pt Aox4, cooperative with care but has s/sx anxiety. Tele, NSR, in place. On 2L NC, 95% O2. Endorses generalized chronic pain, moderate effect with meds. No s/sx distress. PO diuresis, I/o in place, see flowsheet. OOB independently with walker. Call bell in reach, will continue with plan of care.     Problem: Activity:  Goal: Mobility will be supported throughout the hospitalization,  Outcome: Progressing     Problem: Cognitive:  Goal: Caregivers and patients knowledge of risk factors and measures for prevention of condition will be supported throughout the hospitalization  Outcome: Progressing     Problem: Safety:  Goal: Will remain free from falls throughout the hospitalization,  Outcome: Progressing     Problem: Knowledge Deficit  Goal: Patient/S.O. demonstrates understanding of disease process, treatment plan, medications, and discharge instructions.  Description: Complete learning assessment and assess knowledge base  Outcome: Progressing     Problem: Inadequate Coping  Goal: Demonstrates ability to cope effectively  Description: Patient is able to verbalize feelings related to emotional state.  Outcome: Progressing  Goal: Verbalizes adaptive coping mechanisms  Description: Able to verbalize adaptive coping mechanisms such as physical activity, distraction, and deep breathing exercises.  Outcome: Progressing     Problem: Potential for Suicide  Goal: Remain free from self harm  Description: Assess suicide risk on admission and per hospital policy.  Assess and monitor patient's mood and behaviors which may signal an increase in suicidal potential, statements such as "I wish I were dead", acting recklessly, giving away personal possessions, or suicide notes.  Assess patient for symptoms of post-traumatic stress disorder.  Implement suicide precautions per hospital policy.  Collaborate with interdisciplinary team and initiate plan and interventions as ordered.  Outcome: Progressing

## 2022-01-15 NOTE — Plan of Care (Signed)
Problem: Safety:  Goal: Will remain free from falls throughout the hospitalization,  Outcome: Progressing   Pt calm/cooperative overnight. Potassium given times three per order. Remains on tele and O2 at 2L sats in 90's. Medicated for generalized discomfort in am. Nicotine gum given per pt request. OOB brp with rolling walker. Refused am v/s. Call light at side

## 2022-01-15 NOTE — Progress Notes (Signed)
PROGRESS NOTE  01/15/2022    PATIENT INFO: Cheryl Zimmerman, [age] year old female  DATE OF ADMISSION: 01/11/22    ROOM/BED LOCATION:  423/423-A  HOSPITAL DAY: 4    REVIEW OF OVERNIGHT EVENTS:  Patient did not want to be woken up q3hr for KCl. Night team gave the midnight K early at 10pm and gave the next K at 6am. NAOE otherwise     SUBJECTIVE:  Doesn't like that she is urinating so much and refusing to take metolazone. Denies any chest pain, SOB.     OBJECTIVE:    VITAL SIGNS      01/14/22  2009 01/14/22  2040 01/15/22  0840 01/15/22  0851   BP: 127/84   115/79   Pulse: 85   88   Resp: 18 20 20 20    Temp: 98.6 F (37 C)   98.4 F (36.9 C)   TempSrc: Oral   Oral   SpO2: 95%      Weight:       Height:           INS/OUTS (PAST 24 HOURS)  No intake/output data recorded.    PHYSICAL EXAM   GEN:   Diaphoretic woman in mild discomfort and mildly somnolent. NAD.  HEENT:    JVP not distended  CV:      RRR, No S3, no murmurs or rubs  PULM:      Decreased lung sounds at base of lungs otherwise without crackles, rales, or rhonchi. No cough.  ABD:      Soft, normal bowel sounds, NTND  EXT:      1+ pitting edema lower extremities, moderately warm.  SKIN:      Clammy on back. Erythema on central chest with few dispersed 1 mm papules.  NEURO:       A&Ox4. Moving all extremities spontaneously     RECENT LABS  BMP:   Recent Labs     01/12/22  1800 01/13/22  0725 01/14/22  0025 01/14/22  0730 01/14/22  1850   NA 129* 129* 130* 131* 129*   K 3.3* 3.0* 3.1* 2.9* 3.3*   CL 79* 81* 84* 84* 86*   CO2 34* 33* 33* 34* 31   BUN 19* 28* 29* 33* 27*   CREAT 1.3* 1.5* 1.3* 1.1 1.0   GLUCOSER 106 115 163* 120 142     Ca, Mg, Phos:   Recent Labs     01/12/22  1800 01/13/22  0725 01/14/22  0025 01/14/22  0730 01/14/22  1850   CA 9.7 9.3 9.4 9.4 9.6   MG  --  2.2  --  2.2  --    PHOS  --  4.8  --  3.9  --      CBC:   Recent Labs     01/13/22  0725 01/14/22  0730   WBC 8.6 7.3   HCT 43.3 41.8   HGB 14.4 14.8   PLTA 352 372   RBC 4.62  4.46     Coagulation Labs: No results for input(s): PT, INR in the last 72 hours.    Invalid input(s): PTT  LFTs:   Recent Labs     01/13/22  0725   AST 26   ALT 26   TBILI 0.3   DBILI < 0.2   ALKPHOS 181*     Pancreatic Enzymes: No results for input(s): AMY, LIP in the last 72 hours.  Urinalysis:No results for input(s): UACOL, UACLA, UAGLU, UABIL,  UAKET, SPEGRAVURINE, UAOCC, UAPH, UAPRO, UANIT, LEUKOCYTES in the last 72 hours.    Invalid input(s): UAOBU  Troponin: No results for input(s): TROPI in the last 72 hours.      MICROBIOLOGY REVIEW  No new microbiology results to review.    IMAGING AND OTHER STUDIES  EKG: Rate 96, Regular sinus rhythm, normal axis. Known prolonged QT interval; unchanged from comparison. Known inferior wall infarct; unchanged from comparison (01-11-22).    Telemetry notable findings: none.    MEDICATIONS  See list in EPIC    ASSESSMENT & PLAN   77 F with CHF, A. Fib, COPD, bronchial asthma, on 2L home O2, seizure disorder on keppra, BPD, Schizoaffective disorder, polysubstance use on buprenorphine maintenance, PTSD, multiple SA, active cocaine use, obesity presented with chest pain and wasadmitted for SI, hypoK 2.6,substernal chest pressure,SOB consistent with CHF exacerbation. Iinitially on active SI precautions, now no longer on active SI precautions and does not meet criteria for inpatient psych. C/c/b treatment resistant hypokalemia and difficulty with safe discharge planning.      #Electrolyte Abnormalities   #Hypokalemia   #Hypochloremia   #Metabolic Alkalosis   Patient has had multiple electrolyte abnormalities since inial hospitalization. Patient has required aggressive potassium repletion in particular, requiring over 100 mEq of potassium most days. Hypokalemia and metabolic alkalosis initially thought to be due to patient's agressive diuresis plan. Though urine Cl very elevated in 50s, which would not be expected with diuresis. Patient has no signs of hyperaldosteronism, has no  diarrhea or vommiting. If diuretics are not the main cause of electrolyte imbalances, differential includes possibly Gitelman syndrome (which can appear in adulthood) as patient has muscle cramps and spasms, polyuria, metabolic alkalosis, low Cl and severe hypokalemia though Mg levels have been normal and K on chart review has been normal expect for the past month and would assume that for genetic condition that would be slow increase in K.   - Aggressive K repletion and electrolytes as necessary  - Monitor BMP   - Consider curbsiding nephrology   []  TI: likely will need chronic K supplementation at discharge     #SI  #Polysubstance Use   #Housing Insecurity  Presented with active SI without plan. High risk due to multiplerecent hospitalizations 2/2 multiplesuicideattempts. Has been verbally aggressive to staff and has refused labs or medication at times. Initially on 1:1 sitter, psych was consulted and eventually patient no longer needed a sitter and was safe from a psychiatric perspective for discharge.   - SW consulted, appreciate recs  - Psych consulted, appreciate recs:  Does not meet section 12 criteria. No psychiatric contraindication to discharge.       #HFpEF (EF 70%)  Patient found to be fluid overloaded on admission. On torsemide 120 mg BID, though unclear patient's compliance. Patient's volume status inpatient  has been difficult to determine due to patient's body habitus, baseline O2 requirement, and likely chronic leg edema. Strict I/Os have been ordered as well as daily standing weights but I/Os have been difficult to accurately record and patient is refusing standing weights. Patient as continued on home diuretic until AKI developed and home diuretic was  []  TI: Consider R heart cath outpatient   - Continue torsemide at 60 BID  - Continue metolazone 5 mg qam, prazosin 1 mg daily    #Chest Pain   Initially presented with chest pain on admission. Trops mildly elevated but stable. EKG showed ST  depressions seen on prior but no reciprocal changes. Patient developed chest  pain again on 01/14/22. By the time EKG was able to be done, which was of poor quality but no ST changes concerning for ischemia, patient's chest pain had already resolved. Patients trops slightly elevated compared to admission (29 vs 24) but difficult to interpret iso significant AKI. Chest pain seems to be exertional so differential mainly stable again vs type 2 NSTEMI from CHF exacerbation.  - monitor on tele  []  TI: consider ischemic eval outpatient     #AKI - improving   Creat baseline ~0.8. Developed AKI, likely iso overdiuresis. Patient's home emdicat  -Diuresis and fluid management as above    #Hyponatremia  Hyponatremic to 131 on admission and continues to be hyponatremic throughou hospitalization, though levels fluctuating. Urine Osm 89 and Urine Na <20 then 37. Hyponatremia etiology likely has changed with fluctuating volume status. Likely due to low effective circulating volume due to CHF exacerbation but was later caused by aggressive diuretics. Unclear why urine osm so low as patient did not seem to have primary polydipsia.   - Diuresis as above    #Chronic pain  #Somnolence   Pt with recurrence of burning shooting pain from hips/lower back to anterior thighs, to her shins. Has known spinal stenosis with sciatica. On many sedating medications for pain, which have been adjusted due to somnolence.   - continue clotrimazole 1% BID, diclofenac 1% 2g TID, Lidocane patch 3 qam,  - Continue methocarbamol 750 mg TID  - Continue Lorazepam 0.5 mg TID PRN   - Continue Gabapentin 500 mg BID    #Constipation  Chronic constipation. Didn't have bowel movement for about 1-2 weeks on initial presentation. On miralax at home. Senna has helped in previous hospitalizations.   - Senna and Miralax scheduled   - PRN enema     #Isolated Elevated AlkPhos   Elevated on admission. No RUQ pain. Other LFTs WNL.   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 On 2L NC at home, but was only recently started on home O2 within the past month . Supplemental O2 has been titrated iso of both baseline COPD and O2 requirement on top of COPD exacerbation. Continues to beafebrile, withouttachypnea, withoutuse of accessory muscles, andno coughing. O2   - Contine home budesonide-formoterol, albutero q6h prn, fluticasone prn  - Continue NC 2 L    #Polycythemia - Resolved  Polycythemiaresolving, likely due toreturn ofintravascular volume.  -continue to monitor    #Leukocytosis - Resolved  Normalized.A febrile and hemodynamically stable.  -continue to monitor    Telemetry: Yes    Foley: No    FEN: diabetes diet, PO    Prophylaxis: heparin 5,000 units    Medication Reconciliation: Done    Code Status  Full Code    Health care proxy: Data Unavailable  Data Unavailable     Dispo: to sober home, once found      Marcelyn Bruins, MD  PGY1 - Internal Medicine  Pager: [phone]  01/15/2022 9:48 AM    Discussed patient case and plan with attending, Geraldo Docker, MD

## 2022-01-16 DIAGNOSIS — I5032 Chronic diastolic (congestive) heart failure: Secondary | ICD-10-CM

## 2022-01-16 DIAGNOSIS — Z5989 Other problems related to housing and economic circumstances: Secondary | ICD-10-CM

## 2022-01-16 DIAGNOSIS — E876 Hypokalemia: Secondary | ICD-10-CM | POA: Diagnosis not present

## 2022-01-16 DIAGNOSIS — E873 Alkalosis: Secondary | ICD-10-CM | POA: Diagnosis not present

## 2022-01-16 DIAGNOSIS — E878 Other disorders of electrolyte and fluid balance, not elsewhere classified: Secondary | ICD-10-CM | POA: Diagnosis not present

## 2022-01-16 DIAGNOSIS — R45851 Suicidal ideations: Secondary | ICD-10-CM | POA: Diagnosis not present

## 2022-01-16 DIAGNOSIS — R400 Somnolence: Secondary | ICD-10-CM

## 2022-01-16 LAB — BASIC METABOLIC PANEL
ANION GAP: 13 mmol/L (ref 10–22)
BUN (UREA NITROGEN): 32 mg/dL — ABNORMAL HIGH (ref 7–18)
CALCIUM: 9.5 mg/dL (ref 8.5–10.5)
CARBON DIOXIDE: 30 mmol/L (ref 21–32)
CHLORIDE: 87 mmol/L — ABNORMAL LOW (ref 98–107)
CREATININE: 1 mg/dL (ref 0.4–1.2)
ESTIMATED GLOMERULAR FILT RATE: >60 mL/min (ref >60)
Glucose Random: 152 mg/dL (ref 74–160)
POTASSIUM: 3.1 mmol/L — ABNORMAL LOW (ref 3.5–5.1)
SODIUM: 130 mmol/L — ABNORMAL LOW (ref 136–145)

## 2022-01-16 LAB — PHOSPHORUS MAGNESIUM
MAGNESIUM: 1.9 mg/dL (ref 1.6–2.6)
PHOSPHORUS: 3.5 mg/dL (ref 2.5–4.9)

## 2022-01-16 MED ORDER — TORSEMIDE 20 MG PO TABS
60.0000 mg | ORAL_TABLET | Freq: Every day | ORAL | Status: DC
Start: 2022-01-16 — End: 2022-01-17
  Administered 2022-01-16 – 2022-01-17 (×2): 60 mg via ORAL
  Filled 2022-01-16 (×2): qty 3

## 2022-01-16 MED ORDER — POTASSIUM CHLORIDE CRYS ER 20 MEQ PO TBCR
40.0000 meq | EXTENDED_RELEASE_TABLET | ORAL | Status: AC
Start: 2022-01-16 — End: 2022-01-16
  Administered 2022-01-16 (×2): 40 meq via ORAL
  Filled 2022-01-16 (×2): qty 2

## 2022-01-16 MED ORDER — POTASSIUM CHLORIDE CRYS ER 20 MEQ PO TBCR
40.0000 meq | EXTENDED_RELEASE_TABLET | ORAL | Status: DC
Start: 2022-01-16 — End: 2022-01-16
  Administered 2022-01-16: 40 meq via ORAL
  Filled 2022-01-16: qty 2

## 2022-01-16 NOTE — Progress Notes (Signed)
PROGRESS NOTE  01/16/2022    PATIENT INFO: Cheryl Zimmerman, [age] year old female  DATE OF ADMISSION: 01/11/22    ROOM/BED LOCATION:  423/423-A  HOSPITAL DAY: 5    REVIEW OF OVERNIGHT EVENTS:  none    SUBJECTIVE:  Feeling better this morning, enthusiastic about plan for dispo and working with SW. Cheryl Zimmerman a bit jittery but does not think its anxiety    OBJECTIVE:  BP: (118-139)/(77-84)   Temp:  [98.2 ?F (36.8 ?C)-99.6 ?F (37.6 ?C)]   Pulse:  [82-89]   Resp:  [18-20]   SpO2:  [93 %-97 %]     INS/OUTS (PAST 24 HOURS)  I/O 24 Hrs:  In: 1860 [P.O.:1860]  Out: 4700 [Urine:4700]    PHYSICAL EXAM   GEN:   Sitting up in chair, pleasantly conversant  HEENT:    JVP not distended  CV:      RRR, No S3, no murmurs or rubs  PULM:      CTAB without crackles, rales, or rhonchi. No cough.  ABD:      Soft, normal bowel sounds, NTND  EXT:      1+ pitting edema lower extremities, moderately warm.  SKIN:      Clammy on back. Erythema on central chest with few dispersed 1 mm papules.  NEURO:     A&Ox4. Moving all extremities spontaneously     RECENT LABS  BMP:   Recent Labs     01/14/22  0025 01/14/22  0730 01/14/22  1850 01/15/22  1030 01/16/22  0735   NA 130* 131* 129* 135* 130*   K 3.1* 2.9* 3.3* 3.6 3.1*   CL 84* 84* 86* 90* 87*   CO2 33* 34* 31 30 30    BUN 29* 33* 27* 30* 32*   CREAT 1.3* 1.1 1.0 1.2 1.0   GLUCOSER 163* 120 142 117 152     Ca, Mg, Phos:   Recent Labs     01/14/22  0025 01/14/22  0730 01/14/22  1850 01/15/22  1030 01/16/22  0735   CA 9.4 9.4 9.6 9.9 9.5   MG  --  2.2  --  2.0 1.9   PHOS  --  3.9  --  3.3 3.5     CBC:   Recent Labs     01/14/22  0730   WBC 7.3   HCT 41.8   HGB 14.8   PLTA 372   RBC 4.46       MICROBIOLOGY REVIEW  No new microbiology results to review.    IMAGING AND OTHER STUDIES  Telemetry notable findings: none.    MEDICATIONS  See list in EPIC    ASSESSMENT & PLAN   31 F with CHF, A. Fib, COPD, bronchial asthma, on 2L home O2, seizure disorder on keppra, BPD, Schizoaffective disorder,  polysubstance use on buprenorphine maintenance, PTSD, multiple SA, active cocaine use, obesity presented with chest pain and was?admitted for SI, hypoK 2.6,?substernal chest pressure,?SOB consistent with CHF exacerbation. Iinitially on active SI precautions, now no longer on active SI precautions and does not meet criteria for inpatient psych. C/c/b treatment resistant hypokalemia and difficulty with safe discharge planning.      #Electrolyte Abnormalities   #Hypokalemia   #Hypochloremia   #Metabolic Alkalosis   Patient has had multiple electrolyte abnormalities since inial hospitalization. Patient has required aggressive potassium repletion in particular, requiring over 100 mEq of potassium most days. Hypokalemia and metabolic alkalosis initially thought to be due to patient's agressive diuresis plan.  Though urine Cl very elevated in 50s, which would not be expected with diuresis. Patient has no signs of hyperaldosteronism, has no diarrhea or vommiting. If diuretics are not the main cause of electrolyte imbalances, differential includes possibly Gitelman syndrome (which can appear in adulthood) as patient has muscle cramps and spasms, polyuria, metabolic alkalosis, low Cl and severe hypokalemia though Mg levels have been normal and K on chart review has been normal expect for the past month and would assume that for genetic condition that would be slow increase in K.   - Aggressive K repletion and electrolytes as necessary  - Monitor BMP   - Consider curbsiding nephrology Monday  []  TI: likely will need chronic K supplementation at discharge     #SI?  #Polysubstance Use   #Housing Insecurity  Presented with active SI without plan. High risk due to multiple?recent hospitalizations 2/2 multiple?suicide?attempts. Has been verbally aggressive to staff and has refused labs or medication at times. Initially on 1:1 sitter, psych was consulted and eventually patient no longer needed a sitter and was safe from a psychiatric  perspective for discharge.   - SW consulted, appreciate recs  - Psych consulted, appreciate recs:  Does not meet section 12 criteria. No psychiatric contraindication to discharge.       #HFpEF (EF 70%)  Patient found to be fluid overloaded on admission. On torsemide 120 mg BID, though unclear patient's compliance. Patient's volume status inpatient  has been difficult to determine due to patient's body habitus, baseline O2 requirement, and likely chronic leg edema. Strict I/Os have been ordered as well as daily standing weights but I/Os have been difficult to accurately record and patient is refusing standing weights. Patient as continued on home diuretic until AKI developed and home diuretic was  []  TI: Consider R heart cath outpatient   - torsemide at 60 BID down titrated frequency to 60 daily   - Continue metolazone 5 mg qam, prazosin 1 mg daily    #Chest Pain   Initially presented with chest pain on admission. Trops mildly elevated but stable. EKG showed ST depressions seen on prior but no reciprocal changes. Chest pain seems to be exertional and/pr [leuritic  so differential mainly stable angina vs type 2 NSTEMI from CHF exacerbation.  - monitor on tele  []  TI: consider ischemic eval outpatient     #AKI - improving   Creat baseline ~0.8. Developed AKI, likely iso overdiuresis. Patient's home emdicat  -Diuresis and fluid management as above    #Hyponatremia  Hyponatremic to 131 on admission and continues to be hyponatremic throughou hospitalization, though levels fluctuating. Urine Osm 89 and Urine Na <20 then 37. Hyponatremia etiology likely has changed with fluctuating volume status. Likely due to low effective circulating volume due to CHF exacerbation but was later caused by aggressive diuretics. Unclear why urine osm so low as patient did not seem to have primary polydipsia.   - Diuresis as above    #Chronic pain  #Somnolence   Pt with recurrence of burning shooting pain from hips/lower back to anterior  thighs, to her shins. Has known spinal stenosis with sciatica. On many sedating medications for pain, which have been adjusted due to somnolence.   - continue clotrimazole 1% BID, diclofenac 1% 2g TID, Lidocane patch 3 qam,  - Continue ?methocarbamol 750 mg TID  - Continue Lorazepam 0.5 mg TID PRN   - Continue Gabapentin 500 mg BID    ?#Constipation  Chronic constipation. Didn't have bowel movement for about  1-2 weeks on initial presentation. On miralax at home. Senna has helped in previous hospitalizations.   - Senna and Miralax scheduled   - PRN enema     #Isolated Elevated AlkPhos   Elevated on admission. No RUQ pain. Other LFTs WNL.   - CTM   ?  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 On 2L NC at home, but was only recently started on home O2 within the past month . Supplemental O2 has been titrated iso of both baseline COPD and O2 requirement on top of COPD exacerbation. Continues to be?afebrile, without?tachypnea, without?use of accessory muscles, and?no coughing. O2   - Contine home budesonide-formoterol, albutero q6h prn, fluticasone prn  - Continue NC 2 L    #Polycythemia - Resolved  Polycythemia?resolving, likely due to?return of?intravascular volume.  -continue to monitor  ?  #Leukocytosis - Resolved  Normalized.?A febrile and hemodynamically stable.  -continue to monitor    Telemetry: Yes    Foley: No    FEN: diabetes diet, PO    Prophylaxis: heparin 5,000 units    Medication Reconciliation: Done    Code Status  Full Code    Health care proxy: Data Unavailable  Data Unavailable     Dispo: to sober home, once  found      Cheryl Chow A. Maisie Fushomas, MD  Internal Medicine PGY-3  Pager [phone]    Discussed patient case and plan with attending, Enid SkeensHathaway, Patton Sallesachel N, MD

## 2022-01-16 NOTE — Plan of Care (Signed)
Problem: Safety:  Goal: Will remain free from falls throughout the hospitalization,  Outcome: Progressing   Pt anxious at hs stating over death of a child. Hs meds given early per pt request. Woke early am, no anxiety noted. Tol snack. Remains on O2 with sats in 90's. No sob noted. VSS. Call light at side.

## 2022-01-16 NOTE — Plan of Care (Addendum)
Pt Aox4, cooperative with care but gets anxious at times. Tele in place, monitor showing NSR. Uses 2L O2 for comfort. Endorses severe generalized pain this AM, relief with medications. No s/sx distress. This shift was more somnolent, muscle relaxants held, MD aware.  Nicotine gum given. Continue to diurese, I/o. OOB independently with walker. Will continue with plan of care.      Somnolence improved PO ativan given per request.     Problem: Activity:  Goal: Mobility will be supported throughout the hospitalization,  Outcome: Progressing     Problem: Cognitive:  Goal: Caregivers and patients knowledge of risk factors and measures for prevention of condition will be supported throughout the hospitalization  Outcome: Progressing     Problem: Safety:  Goal: Will remain free from falls throughout the hospitalization,  Outcome: Progressing     Problem: Knowledge Deficit  Goal: Patient/S.O. demonstrates understanding of disease process, treatment plan, medications, and discharge instructions.  Description: Complete learning assessment and assess knowledge base  Outcome: Progressing     Problem: Inadequate Coping  Goal: Demonstrates ability to cope effectively  Description: Patient is able to verbalize feelings related to emotional state.  Outcome: Progressing  Goal: Verbalizes adaptive coping mechanisms  Description: Able to verbalize adaptive coping mechanisms such as physical activity, distraction, and deep breathing exercises.  Outcome: Progressing     Problem: Potential for Suicide  Goal: Remain free from self harm  Description: Assess suicide risk on admission and per hospital policy.  Assess and monitor patient's mood and behaviors which may signal an increase in suicidal potential, statements such as "I wish I were dead", acting recklessly, giving away personal possessions, or suicide notes.  Assess patient for symptoms of post-traumatic stress disorder.  Implement suicide precautions per hospital policy.   Collaborate with interdisciplinary team and initiate plan and interventions as ordered.  Outcome: Progressing

## 2022-01-16 NOTE — Case Mgmt Note (Addendum)
Discussed in MDR. Remains medically cleared for dc. Variance day entered. CM to ff up w/ [facility] tomorrow to see if pt is accepted. SW following for other possible facilities.

## 2022-01-17 DIAGNOSIS — E878 Other disorders of electrolyte and fluid balance, not elsewhere classified: Secondary | ICD-10-CM | POA: Diagnosis not present

## 2022-01-17 DIAGNOSIS — E876 Hypokalemia: Secondary | ICD-10-CM | POA: Diagnosis not present

## 2022-01-17 DIAGNOSIS — E873 Alkalosis: Secondary | ICD-10-CM | POA: Diagnosis not present

## 2022-01-17 DIAGNOSIS — R45851 Suicidal ideations: Secondary | ICD-10-CM | POA: Diagnosis not present

## 2022-01-17 LAB — CBC WITH PLATELET
ABSOLUTE NRBC COUNT: 0 10*3/uL (ref 0.0–0.0)
HEMATOCRIT: 41.7 % (ref 34.1–44.9)
HEMOGLOBIN: 13.7 g/dL (ref 11.2–15.7)
MEAN CORP HGB CONC: 32.9 g/dL (ref 31.0–37.0)
MEAN CORPUSCULAR HGB: 30.9 pg (ref 26.0–34.0)
MEAN CORPUSCULAR VOL: 93.9 fl (ref 80.0–100.0)
MEAN PLATELET VOLUME: 10.5 fL (ref 8.7–12.5)
NRBC %: 0 % (ref 0.0–0.0)
PLATELET COUNT: 383 10*3/uL (ref 150–400)
RBC DISTRIBUTION WIDTH STD DEV: 49.4 fL — ABNORMAL HIGH (ref 35.1–46.3)
RED BLOOD CELL COUNT: 4.44 M/uL (ref 3.90–5.20)
WHITE BLOOD CELL COUNT: 6.6 10*3/uL (ref 4.0–11.0)

## 2022-01-17 LAB — BASIC METABOLIC PANEL
ANION GAP: 14 mmol/L (ref 10–22)
ANION GAP: 14 mmol/L (ref 10–22)
BUN (UREA NITROGEN): 33 mg/dL — ABNORMAL HIGH (ref 7–18)
BUN (UREA NITROGEN): 30 mg/dL — ABNORMAL HIGH (ref 7–18)
CALCIUM: 9.5 mg/dL (ref 8.5–10.5)
CALCIUM: 9.4 mg/dL (ref 8.5–10.5)
CARBON DIOXIDE: 31 mmol/L (ref 21–32)
CARBON DIOXIDE: 32 mmol/L (ref 21–32)
CHLORIDE: 88 mmol/L — ABNORMAL LOW (ref 98–107)
CHLORIDE: 86 mmol/L — ABNORMAL LOW (ref 98–107)
CREATININE: 1 mg/dL (ref 0.4–1.2)
CREATININE: 1 mg/dL (ref 0.4–1.2)
ESTIMATED GLOMERULAR FILT RATE: >60 mL/min (ref >60)
ESTIMATED GLOMERULAR FILT RATE: >60 mL/min (ref >60)
Glucose Random: 160 mg/dL (ref 74–160)
Glucose Random: 123 mg/dL (ref 74–160)
POTASSIUM: 2.9 mmol/L — ABNORMAL LOW (ref 3.5–5.1)
POTASSIUM: 2.8 mmol/L — ABNORMAL LOW (ref 3.5–5.1)
SODIUM: 133 mmol/L — ABNORMAL LOW (ref 136–145)
SODIUM: 132 mmol/L — ABNORMAL LOW (ref 136–145)

## 2022-01-17 LAB — PHOSPHORUS MAGNESIUM
MAGNESIUM: 1.9 mg/dL (ref 1.6–2.6)
MAGNESIUM: 1.9 mg/dL (ref 1.6–2.6)
PHOSPHORUS: 3.4 mg/dL (ref 2.5–4.9)
PHOSPHORUS: 3.4 mg/dL (ref 2.5–4.9)

## 2022-01-17 LAB — EKG

## 2022-01-17 MED ORDER — POTASSIUM CHLORIDE CRYS ER 20 MEQ PO TBCR
40.00 meq | EXTENDED_RELEASE_TABLET | ORAL | Status: AC
Start: 2022-01-17 — End: 2022-01-17
  Administered 2022-01-17 (×3): 40 meq via ORAL
  Filled 2022-01-17 (×3): qty 2

## 2022-01-17 MED ORDER — LORAZEPAM 2 MG/ML IJ SOLN
0.25 mg | Freq: Once | INTRAMUSCULAR | Status: AC
Start: 2022-01-17 — End: 2022-01-17
  Administered 2022-01-17: 0.25 mg via INTRAVENOUS
  Filled 2022-01-17: qty 1

## 2022-01-17 MED ORDER — AMILORIDE HCL 5 MG PO TABS
5.0000 mg | ORAL_TABLET | Freq: Every day | ORAL | Status: DC
Start: 2022-01-18 — End: 2022-01-20
  Administered 2022-01-18 – 2022-01-20 (×3): 5 mg via ORAL
  Filled 2022-01-17 (×3): qty 1

## 2022-01-17 MED ORDER — LORAZEPAM 0.5 MG PO TABS
0.5000 mg | ORAL_TABLET | Freq: Once | ORAL | Status: AC | PRN
Start: 2022-01-17 — End: 2022-01-17

## 2022-01-17 NOTE — Plan of Care (Signed)
Problem: Safety:  Goal: Will remain free from falls throughout the hospitalization,  Outcome: Progressing   Pt medicated for anxiety times one overnight re; SS check has not arrived to account. No c/o pain or sob. OOB bsc steady gait. VSS. Call light at side.

## 2022-01-17 NOTE — Progress Notes (Signed)
Pt discussed during MDR.     SW/RC are working on getting pt a bed at sober home, halfway house, or [facility] bed. See RC note below.     "Charnessa gets [employer] and is willing to go to sober house. I called [facility] but unfortunately Laurinda is not a good fit for a sober house at this time. She doesn?t have her debit card. Elmyra Ricks (SW) has been in touch with [facility] who recommended she go to a long term treatment facility or partial plus program .I faxed a referral and filled out application for [facility]. I also faxed her referral to [facility]."      SW provided update to Ali Chukson at [facility].     SW will remain available

## 2022-01-17 NOTE — Plan of Care (Signed)
Pt is AOX4, on RA 02sat:88-91%, pt is anxious throughout the shift, PRN ativan given a/o with minimal effect, no acute distress noted, cont on pain management with good effect, appetite and po intake fair, tolerated all po meds well, cont care provided, around 5:30 pt was found in the elevator , pt stated "I see my dead son in my room , I am scared." Pt was able to verbalized her needs, pt was redirect to nursing station. MD notified, pt was seen by MD with new order (SEE MAR), medically clear, awaiting for bed, safety precaution maintained, call light within reach,will cont with plan of care.   Problem: Activity:  Goal: Mobility will be supported throughout the hospitalization,  Outcome: Progressing     Problem: Cognitive:  Goal: Caregivers and patients knowledge of risk factors and measures for prevention of condition will be supported throughout the hospitalization  Outcome: Progressing     Problem: Safety:  Goal: Will remain free from falls throughout the hospitalization,  Outcome: Progressing     Problem: Knowledge Deficit  Goal: Patient/S.O. demonstrates understanding of disease process, treatment plan, medications, and discharge instructions.  Description: Complete learning assessment and assess knowledge base  Outcome: Progressing     Problem: Inadequate Coping  Goal: Demonstrates ability to cope effectively  Description: Patient is able to verbalize feelings related to emotional state.  Outcome: Progressing  Goal: Verbalizes adaptive coping mechanisms  Description: Able to verbalize adaptive coping mechanisms such as physical activity, distraction, and deep breathing exercises.  Outcome: Progressing     Problem: Potential for Suicide  Goal: Remain free from self harm  Description: Assess suicide risk on admission and per hospital policy.  Assess and monitor patient's mood and behaviors which may signal an increase in suicidal potential, statements such as "I wish I were dead", acting recklessly, giving away  personal possessions, or suicide notes.  Assess patient for symptoms of post-traumatic stress disorder.  Implement suicide precautions per hospital policy.  Collaborate with interdisciplinary team and initiate plan and interventions as ordered.  Outcome: Progressing

## 2022-01-17 NOTE — Plan of Care (Signed)
Pt AOX3. On 2 L NC. Reports back discomfort, - see EMAR. Pt requested PRN ativan " I'm feeling anxious." Calm and cooperative with care. Able to take all PO meds whole with water. Safety maintained, seizure precautions in place. Will continue to monitor.       Problem: Activity:  Goal: Mobility will be supported throughout the hospitalization,  Outcome: Progressing     Problem: Cognitive:  Goal: Caregivers and patients knowledge of risk factors and measures for prevention of condition will be supported throughout the hospitalization  Outcome: Progressing     Problem: Safety:  Goal: Will remain free from falls throughout the hospitalization,  Outcome: Progressing     Problem: Knowledge Deficit  Goal: Patient/S.O. demonstrates understanding of disease process, treatment plan, medications, and discharge instructions.  Description: Complete learning assessment and assess knowledge base  Outcome: Progressing     Problem: Inadequate Coping  Goal: Demonstrates ability to cope effectively  Description: Patient is able to verbalize feelings related to emotional state.  Outcome: Progressing  Goal: Verbalizes adaptive coping mechanisms  Description: Able to verbalize adaptive coping mechanisms such as physical activity, distraction, and deep breathing exercises.  Outcome: Progressing     Problem: Potential for Suicide  Goal: Remain free from self harm  Description: Assess suicide risk on admission and per hospital policy.  Assess and monitor patient's mood and behaviors which may signal an increase in suicidal potential, statements such as "I wish I were dead", acting recklessly, giving away personal possessions, or suicide notes.  Assess patient for symptoms of post-traumatic stress disorder.  Implement suicide precautions per hospital policy.  Collaborate with interdisciplinary team and initiate plan and interventions as ordered.  Outcome: Progressing

## 2022-01-17 NOTE — Consults (Signed)
NEPHROLOGY CONSULT  NOTE    DATE: 01/17/2022   ADMISSION DATE & TIME: 01/11/22      Pt summary  This is a 51 year old female with CHF, A fib, COPD (now 2L NC at home), obesity, complex psychiatric hx with multiple hospitalizations for SA p/w chest pain and SOB. Admitted for CHF exacerbation; nephrology consulted for persistent hypok despite aggressive PO repletion.    Reason for consult: hypokalemia  Patient was admitted on 4/25 for shortness of breath and volume overload thought to be secondary to heart failure exacerbation.     2 prior admissions in April    4/03-4/01/2022 pt had psych admission for suicidality  K on 4/05 was nl 4.6 and pt has not had hypoK problems in the past nor known renal dz. On that psych admission, home torsemide 100 mg BID was increased to 120 BID for SOB and peripheral edema attributed to volume overload    Patient on discharge was prescribed metolazone 5 mg along with 120 mg of torsemide twice daily.      Unclear when or why metolazone was started    Again admitted 4/5-4/20 to psychiatry, per dc note torsemide 120 BID, body of not did not mention metolazone but was on PTA admission and dc med list    On this admission for CP SOB 4/25 pts BMP was significant for K 2.6 w metabolic alkalosis 36 (baseline 30-35), bicarb 36, low chloride of 79 (baseline 98-100), hyponatremia 131 (baseline normal 140s).    Pt was diuresed with recorded home regimen of torsemide 120 twice daily and daily metolazone 5.  Patient remained hypokalemic despite aggressive repletion (80 to 200 mEq KCl a day, yesterday 120 meq) picked up to 3.6 at the highest, down to 2.9    Cbc on admission concentrated hgb 17  (b/l 14)/WBC 14/plts 449, since normalized  NTprobnp 361 (prior ranged from 36-600s)  cxr unremarkable    Urine lytes 4/27 noon- primary team noted Urine Cl high at  53 inconsistent w/ hypovolemia/ low EABV however upon MAR review this was HD 2 and pt had received 120 mg torsemide x 4 (480 mg) and metolazone 5 mg  x3 by this point     Difficult to assess volume status  I/Os has many missing days likely unreliable  Per bed scale wts is 143 kg on 4/25, I do not see repeat wt since        SUBJECTIVE:    Chronic pain, occasional SOB. Overall SOB getting better but unpredictable at times, often at rest intermittently. Sometimes coincides with anxiety.   No more CP  No LH lightheadedness, N/V/D, fevers      Patient reports she was not taking her meds outpatient in between her admissions.    Denies nausea vomiting.  Says had minimal oral intake because of no appetite.  Had access to food.  Estimates drinking around 6 great pitchers of water a day because of the only thing she could stomach to decrease her hunger pains but she does not want to eat.    Says occasionally uses cocaine 1x/6 months. Last use 2 days ago. Declined telling me how much she used, but confirmed it was crack cocaine.       PHYSICAL EXAM:  BP 116/73   Pulse 91   Temp 98.3 F (36.8 C)   Resp 20   Ht 5\' 9"  (1.753 m)   Wt (!) 143 kg (315 lb 3.2 oz)   SpO2 94%   BMI  46.55 kg/m   General: NAD, alert and interactive  HEENT: MMM, nonicterictic sclear  CV: Regular, no murmur  Resp: Normal WOB, CTAB. 1.5 L NC speaking in full sentences  GI: Soft, obese, no rebound or involuntary guarding  Ext: Warm, 1+ edema on bilateral shins  Neuro: alert and orienteded to person situation place CN II-XII grossly intact, moving all extremities  Psych: Affect and speech appropriate to situation      HOME MEDICATIONS:  Prior to Admission Medications   Prescriptions Last Dose Informant Patient Reported? Taking?   FLUoxetine (PROZAC) 20 MG capsule   No No   Sig: Take 1 capsule by mouth in the morning.   Loratadine 10 MG CAPS   No No   Sig: Take 10 mg by mouth in the morning.   OTHER MEDICATION   No No   Sig: Wheelchair: Electric  For use as directed.    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 Begin with 2 LPM and monitor oxygen saturation using a pulse oximeter.  2. If oxygen saturation is below 90%, increase the flow rate by 0.5 LPM every 15-30 minutes until the desired saturation level of 90%-95% is achieved.  3. If oxygen saturation is above 95%, decrease the flow rate by 0.5 LPM every 15-30 minutes until the desired saturation level of 90%-95% is achieved.  4. Once the desired saturation level is achieved, maintain the flow rate at that level.    DX: COPD with Hypoxia, Requires continuous supplemental oxygen  ICD10: Code: J44.9 and Z99.81    SIGNED Georgianne Fick, CNP, 01/05/2022    Nurse Practitioner   Hospitalist Division  [facility]  [address] [place] [address]  Phone:[phone] (fastest) or [phone]   Email:[email or web address]     NPI: [id number]   OXcarbazepine (TRILEPTAL) 150 MG tablet   No No   Sig: Take 1 tablet by mouth in the morning and 1 tablet before bedtime.   QUEtiapine (SEROQUEL) 50 MG tablet   No No   Sig: Take 1 tablet by mouth at bedtime   Torsemide 60 MG TABS   No No   Sig: Take 120 mg by mouth in the morning and 120 mg in the evening.   albuterol HFA 108 (90 Base) MCG/ACT inhaler   No No   Sig: Inhale 2 puffs into the lungs every 6 (six) hours as needed for Wheezing   aspirin 81 MG chewable tablet   No No   Sig: Take 1 tablet by mouth in the morning.   atorvastatin (LIPITOR) 80 MG tablet   No No   Sig: Take 1 tablet by mouth in the morning.   azithromycin (ZITHROMAX) 500 MG tablet   No No   Sig: Take 1 tablet by mouth 3 (three) times a week   budesonide-formoterol (SYMBICORT) 160-4.5 MCG/ACT inhaler   No No   Sig: Inhale 2 puffs into the lungs in the morning and 2 puffs before bedtime.   buprenorphine-naloxone (SUBOXONE) 8-2 MG sublingual tablet   No No   Sig: Place 1 tablet under the tongue in the morning and 1 tablet at noon and 1 tablet before bedtime. Max Daily Amount: 3  tablets. Do all this for 7 days.  clotrimazole (LOTRIMIN AF) 1 % cream   No No   Sig: Apply topically 2 (two) times daily   diclofenac (VOLTAREN) 1 % GEL Gel   No No   Sig: Apply 2 g topically 3 (three) times daily as needed (knee pain)   fluticasone (FLONASE) 50 MCG/ACT nasal spray   No No   Sig: 1 spray by Each Nostril route 2 (two) times daily as needed (congestion)   gabapentin (NEURONTIN) 300 MG capsule   No No   Sig: Take 2 capsules by mouth in the morning and 2 capsules at noon and 2 capsules before bedtime.   levETIRAcetam (KEPPRA) 750 MG tablet   No No   Sig: Take 1 tablet by mouth in the morning and 1 tablet before bedtime.   levETIRAcetam (KEPPRA) 750 MG tablet   No No   Sig: Take 1 tablet by mouth in the morning and 1 tablet before bedtime.   levothyroxine (SYNTHROID) 25 MCG tablet   No No   Sig: Take 1 tablet by mouth every morning before breakfast   lidocaine (SALONPAS) 4 % PTCH   No No   Sig: Apply 1 patch topically in the morning.   loxapine (LOXITANE) 25 MG capsule   No No   Sig: Take 1 capsule by mouth in the morning and 1 capsule before bedtime.   methocarbamol (ROBAXIN) 750 MG tablet   No No   Sig: Take 1 tablet by mouth in the morning and 1 tablet at noon and 1 tablet before bedtime.   metolazone (ZAROXOLYN) 5 MG tablet   No No   Sig: Take 1 tablet by mouth in the morning.   nicotine (NICODERM CQ) 21 MG/24HR   No No   Sig: Place 1 patch onto the skin in the morning.   nicotine polacrilex (NICORETTE) 4 MG gum   No No   Sig: Take 1 each by mouth every 2 (two) hours as needed for [place] (Nicotine)   nicotine polacrilex (NICORETTE) 4 MG gum   No No   Sig: Take 1 each by mouth every 2 (two) hours as needed for [place]ving (Nicotine)   pantoprazole (PROTONIX) 40 MG tablet   No No   Sig: Take 1 tablet by mouth in the morning.   prazosin (MINIPRESS) 1 MG capsule   No No   Sig: Take 3 capsules by mouth nightly   torsemide 60 MG TABS   No No   Sig: Take 120 mg by mouth in the morning and 120 mg at noon and  120 mg before bedtime.      Facility-Administered Medications: None        HOSPITAL MEDICATIONS:  Scheduled Meds:   potassium chloride SA  40 mEq Oral Q3H    torsemide  60 mg Oral Daily    acetaminophen  1,000 mg Oral Q6H SCH    nystatin   Topical BID    lidocaine  3 patch Topical Q24H    gabapentin  500 mg Oral TID    methocarbamol  500 mg Oral TID    polyethylene glycol  17 g Oral Daily    sennosides  17.2 mg Oral Nightly    heparin (porcine)  5,000 Units Subcutaneous Q12H [facility]    aspirin  81 mg Oral Daily    atorvastatin  80 mg Oral Daily    azithromycin  500 mg Oral Once per day on Mon Wed Fri    budesonide-formoterol  2 puff Inhalation BID    buprenorphine-naloxone  8 mg Sublingual TID    clotrimazole   Topical BID    FLUoxetine  20 mg Oral Daily    levETIRAcetam  750 mg Oral BID    levothyroxine  25 mcg Oral DAILY    loratadine  10 mg Oral Daily    loxapine  25 mg Oral BID    metolazone  5 mg Oral Daily    nicotine  1 patch Transdermal Daily    OXcarbazepine  150 mg Oral BID    pantoprazole  40 mg Oral Daily    prazosin  3 mg Oral Nightly    QUEtiapine  50 mg Oral Nightly       PRN Meds:  sennosides, LORazepam, albuterol HFA, diclofenac, nicotine polacrilex        RECENT DATA:  Recent labs, imaging, and EKGs personally reviewed. Relevant findings noted here and/or discussed in A&P below.    Labs:    Chemistries:  Recent Labs     01/14/22  1850 01/15/22  1030 01/16/22  0735 01/17/22  0720   NA 129* 135* 130* 133*   K 3.3* 3.6 3.1* 2.9*   CO2 31 30 30 31    BUN 27* 30* 32* 33*   CREAT 1.0 1.2 1.0 1.0   CA 9.6 9.9 9.5 9.5   MG  --  2.0 1.9 1.9   PHOS  --  3.3 3.5 3.4   ANION 12 15 13 14    GFR > 60 55* > 60 > 60    GI:  No results for input(s): AST, ALT, TBILI, DBILI, IBIL, ALKPHOS, GGTP, ALBUMIN, LIP in the last 72 hours.   CBC:  Recent Labs     01/17/22  0720   WBC 6.6   HGB 13.7   HCT 41.7   PLTA 383    Coags:   No results for input(s): INR, PT, APTT, HPTT, FIB in the last 72 hours.    Trops, BNP, D-dimer, Lactate, C-RP:  No results for input(s): TROPI, PROBNP, DDPEDVT, DD, LACTICACID, CRP in the last 72 hours.    Finger Sticks:  No results for input(s): FINGERSTICKR in the last 72 hours. Urinalysis:  No results for input(s): UACOL, UACLA, UAGLU, UABIL, UAKET, SPEGRAVURINE, UAOCC, UAPH, UAPRO, UABAC, UARBC, UANIT, LEUKOCYTES, UABAC, UASQE in the last 72 hours.    Invalid input(s): UAOBU,  UAWBC,  UAMIC             ASSESSMENT & PLAN:  This is a 51 year old female with CHF, A fib, COPD, complex psychiatric hx with multiple hospitalizations for SA p/w chest pain and SOB. Admitted for CHF exacerbation (4/25-) and nephrology was consulted for persistent hypokalemia.        Problems:        #Hypokalemia in the setting of hypovolemia/perpetuated by metabolic alkalosis   #metabolic alkalosis  Pt has no known renal dz nor hypokalemia  Pt was likely euvolemic or hypovolemic on admission  Urine lytes no ADH activity and low Na iso minimal food and profuse free water intake  (pt reports 6L) suggests beer potomania/tea toast diet  Hypovolemia from overdiuresis in prior admissions likely generated the metabolic alkalosis with concurrent HypoK which mutually reinforces each other through increased RAAS activity and K/H exchange in the distal tubules  Despite oral K repletion, there was renal K wasting at 3 sites: via loop (Na/K/2Cl), thiazides (NaCl transporter ), and metabolic alkalosis (K/H exchange in distal tubules)   Recommendations:  - hold diuretics  - start 5  mg of amiloride- K sparing diuretic/Enac channel blocker (less hormonal s/e than spirinolactone)   - replete K, BID checks while on amiloride (would not recommend being as aggressive with K PO repletion without more BMP checks on amiloride)  - when pt has lyte derangements corrected, would start torsemide at lower doses ie 40 mg  -Recommend strict I/o  -Daily standing wts    #hyponatremia, hypo or euvolemic likely hypo-osmotic  On admission  Initially 131, unknown serum Osm; if assuming true hypoosmolar hyponatremia  4/25 ~8 am UOsm 89, Una < 20, dilute urine suggesting low solute diet, beer potomania vs excessive free water intake    #AKI, resolved  Had AKI 4/27 HD2 iso aggressive diuresis with furosemide 120 BID on 4/26 and metolazone 5 daily; 4/28 torsemide was dec to 60 mg QD, now back to baseline      #HTN  SBP 110s-130s iso aggressive diuresis  Pt denied orthostatic sx  ctm  Hold diuretics as above    VTE PPx: heparin subq bid    Diet: Diet - Base Diet: Regular  Code: Full Code  Contact/HCP: Data Unavailable Data Unavailable  PCP: [name], MD        Elyse Hsu, MD, 01/17/2022  Nephrology consult  [phone]  Discussed with Dr Marisa Sprinkles

## 2022-01-17 NOTE — Discharge Summary (Addendum)
Physician Discharge Summary     Patient expected <30 days at rehab facility.     Patient ID:  Name Cheryl Zimmerman  MRN [id number]   Age 51 year old  DOB 19-Nov-1970    Code Status at Admission: Full  Code Status at Discharge: Full Code   Healthcare Proxy and Phone Number: Data Unavailable     Admit Date: 01/11/22  Discharge Date and Time: 01/27/2022    Admitting Physician: Cheryl Boozer, MD    Discharge Physicians:    Intern: Cheryl Dowse, MD   Attending: Mickey Zimmerman., MD    Discharge Diagnoses:   Congestive heart failure exacerbation  Electrolyte derangements (Hypokalemia, Hyponatremia)   Hypochloremic metabolic alkalosis   Chronic pain  Constipation   Acute kidney injury   Housing insecurity     Issues for Follow up:   [ ]  Please check a BMP in 1-3 days (on new diuretic regimen).  [ ]  Continue to titrate oral diuretics as clinically warranted. Discharged on Torsemide 40 and Spironolactone 50 daily with good urine output and stable electrolytes, Cr.   [ ]  Continue to closely monitor and trend weights, volume status.  [ ]  Patient expressed concern about a dysfunction with her home O2 device. RT from her device company will need to come to her outpatient to assist with this.   [ ]  Needs a sleep study given overnight O2 need.  [ ]  Consider further ischemic work-up regarding her CHF and chest pain.  [ ]  Isolated elevated ALP, no abdominal pain, continue to monitor.  [ ]  She was prescribed Ativan 0.77m TID PRN for her anxiety and was requiring this daily during her admission. On discharge, sent a 7 day script for Ativan 0.5 BID. Please continue to wean as tolerated and assure appropriate psychiatric follow up.   [ ]  Continue suboxone. 7 day script faxed at discharge.      Relevant Past Medical History: Past Medical History:  schizo: Bipolar affective ([facility]  No date: Chronic systolic CHF (congestive heart failure) [place]  No date: Depression  12/23/2021: Fall in home  12/23/2021: Gait instability  12/23/2021: Requires  continuous at home supplemental oxygen  depress: Schizo affective schizophrenia ([facility]  12/23/2021: Spinal stenosis of lumbar 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   claudication  No date: Substance abuse ([facility]     Summary of the Presenting Complaint:   From HPI 4/25:  "[name]. She has been living on the streets since. She has not been able to pick up her medication due to her living and financial situation and didn't want to carry all her medications around. She has been feeling generally unwell and experiencing generalized body aches and pains for the past few week. She smoked Marijuana and snorted and smoked cocaine 2 days ago in order to relieve her body aches; however her general aches persist. She is not able to point to a specific are of concern but feels unwell and irritable.    She was woken up this morning by a strong central chest pain that traveled to her right ear and jaw and left arm. The chest pain is now isolated to the center of the chest and worsens when she walks. Additionally she experiences chills and shortness of breath. She describes it as a charlie horse in the center of her chest that does not travl. She is also experiencing SOB which has been worsening of the past 3 days.  She cannot sleep lying flat, but that has been a constant issue. She explains that her ambulatory oxygen concentrator has not been functioning well. She has been coughing up dark green phlegm. She has had nausea and dry heaving but no vomiting. She knows her CHF symptoms and feels her body swelling and shortness of breath are associated with that.    She has last drank alcohol 3 days ago; she drank 2 cans.  She last used cocaine 2 days ago by snorting and smoking.    ED Course:  -- K noted to be 2.6   -- CXR clear of acute processes, has diffuse patchy infiltrates  -- received buprenorphine-nalonone 8-2 mg SL, lorazepam 1 mg, nicotine gum 4 mg,  tylenol 1,000 mg, ondansetron 4 mg, 40 mEQ potassium chloride"    Hospital Course:   53 F with CHF, A. Fib, COPD, bronchial asthma, on 2L home O2, seizure disorder on keppra, BPD, Schizoaffective disorder, polysubstance use on buprenorphine maintenance, PTSD, multiple SA, active cocaine use, obesity presented with chest pain and wasadmitted for SI, hypoK 2.6,substernal chest pressure,SOB and exam concerning for CHF exacerbation. Initially on active SI precautions followed by psych, was not meeting section 12 and moods improved. Course c/b difficult to titrate diuresis and hypokalemia.    #HFpEF (EF 70%)  #Hypervolemia   Patient found to be fluid overloaded on admission. On torsemide 120 mg BID PTA, though unclear patient's compliance. Patient's volume status inpatient  has been difficult to determine due to patient's body habitus, baseline O2 requirement, and suspected component of chronic leg edema. Strict I/Os ordered as well as daily standing weights but I/Os were difficult to accurately record and patient intermittently refused standing weights. Patient was diuresed but developed an AKI, and her diuretic titration was complicated by ongoing hypokalemia. Discontinued home metolazone 5/1. Hypokalemia resolved with aggressive repletion and once normal she was started back on Torsemide at low dose 10, uptitrated based on clinical exam, volume status, and weights. Urine output improved on regimen of spironolactone 50 QD and Torsemide 40 QD.    #Sore throat   No exudates, some subtle generalized erythema of posterior oropharynx. No lymphadenopathy. No cough. Uses inhalers and was not rinsing mouth out after for a few days, says it feels like her prior thrush. COVID neg. Overall improved with Nystatin swish and lozenges.    #Chest pain   Initially presented with chest pain on admission. Trops mildly elevated but stable. EKG showed ST depressions seen on prior but no reciprocal changes. Chest pain seemed to be  exertional and/pleuritic, differential stable angina vs type 2 NSTEMI from CHF exacerbation, pressure from fluid overload, anxiety, full body chronic pain. She was monitored on tele here with no acute events. While the chest pain had resolved, she did complain of return and a repeat EKG was ordered which the patient refused, and pain subsided. While overall unclear etiology, she could benefit from further ischemic workup outpatient given her risk factors of obesity, CHF.    #SI  #Flashbacks   #Polysubstance Use   #Housing Insecurity  Presented with active SI without plan.High risk due to multiplerecent hospitalizations 2/2 multiplesuicideattempts. Was initially verbally aggressive to staff and refused labs or medication at times. Early on required a 1:1 sitter, psych was consulted and eventually patient no longer needed a sitter and was safe from a psychiatric perspective. Per latest psychiatric assessment she did not meet Section 12 criteria, she was having some reported hallucinations of her deceased son, but it was  unclear if these were true hallucinations versus vivid memories and flashbacks in the setting of stress and anxiety. Her nightly seroquel was increased to 100. Her moods improved and she was pleasant with staff interactions. She met with social work to help her with some ongoing financial problems and applications. During her stay, she did have moments of anxiety and was verbally consolable.     #Hypokalemia - resolved   #Hypochloremiac Metabolic Alkalosis - resolved   Patient has had multiple electrolyte abnormalities since inial hospitalization, requiring aggressive potassium repletion in particular. After diuresis, she was requiring great than 100 mEq of potassium for several days. Hypokalemia and metabolic alkalosis were likely due to patient's agressive diuresis plan, though urine Cl elevated in 50s, which would not be expected with diuresis. Patient has no signs of hyperaldosteronism, has  no diarrhea or vommiting. If diuretics are not the main cause of electrolyte imbalances, differential includes possibly Gitelman syndrome (which can appear in adulthood) as patient has muscle cramps and spasms, polyuria, metabolic alkalosis, low Cl and severe hypokalemia though Mg levels have been normal and K on chart review has been normal expect for the past month - would assume that for genetic condition that would be slow increase in K. Her Torsemide was held while repleting K, briefly was on amiloride. Torsemide resumed/titrated once K was normal. Her K remained normal after resuming Torsemide and starting Spironolactone.     #Chronic pain  Pt with recurrence of burning shooting pain from hips/lower back to anterior thighs, to her shins. Has known spinal stenosis with sciatica. On many sedating medications for pain, which were adjusted during her stay to manage her pain appropriately in the setting of somnolence. We continued clotrimazole 1% BID, diclofenac 1% 2g TID, Lidocane patch 3 qam. Her home muscle relaxant,methocarbamol 750 mg TID, was decreased to 563m and held for somnolence. For her anxiety, she did require Lorazepam 0.5 mg TID PRN and a 7 day script for this was provided at discharge at BID dosing in attempt to wean her. Gabapentin500 mg BID was continued during her admission.     #Constipation  Chronic constipation. Didn't have bowel movement for about 1-2 weeks on initial presentation. On miralax at home. Senna has helped in previous hospitalizations. Senna and Miralax were scheduled, although patient remained with constipation. She refused suppository.      #COPD  #Chronic overnight oxygen requirement   On 2L NC at home overnight after a recent hospital discharge, but was only recently started on home O2 within the past month with plan for ongoing outpatient workup, likely needs a sleep study. VBG this admission reassuring. Throughout her course was afebrile, withouttachypnea, withoutuse  of accessory muscles, andno coughing. During her stay, we continued her home budesonide-formoterol, albuterol q6h prn, fluticasone prn. We continued her NC 2 L overnight PRN, and she intermittently requested oxygen during the day for comfort. Sats 94% on RA. She did not having any wheezing.     #AKI - resolved   Creat baseline ~0.8. Developed AKI early in her course to 1.5, likely iso overdiuresis. Cr then returned back to baseline and stayed wnl for the remainder of her stay.     #Isolated Elevated AlkPhos   Elevated on admission. Level 181 on 4/27. No RUQ pain. Other LFTs WNL.     #Hyponatremia - resolved   Hyponatremic to 131 on admission and continued with periods of hyponatremia throughout hospitalization. Urine Osm 89 and Urine Na <20 then 37. May be ISO fluctuating volume  status, low effective circulating volume due to CHF exacerbation, then later aggressive diuretics. Her sodium levels returned to normal levels.     #Polycythemia- Resolved  Polycythemianoted on admission, likelydue toreturn ofintravascular volume.  -continue to monitor    #Leukocytosis- Resolved  13.8 on admission. ?reactive. Normalized the next day.Remained  afebrile and hemodynamically stable during her stay. Daily CBC did not reveal any return of leukocytosis.    Chronic Issues:  #Seizure Disorder: c/hlevetiracetam 750 mg, oxacarbazepine 150 mg BID  #CAD: c/h Aspirin 81 daily  #HLD: c/h atorvastatin 80 mg  #Hypothyroidism: c/h levothyroxine 25 mcg with breakfast  #Schizoaffective Disorder: c/h Loxapine 25 mg, quetiapine 50 mg  #OUD:c/h buprenorphine-nalonone 8-2 mg SL, 7 day script faxed at discharge  #TUD: nicotine gum 4 mg, nicotine patch   #GERD: pantoprazole 40 mg qam      Discharge Exam:   BP 112/81   Pulse 73   Temp 97.4 F (36.3 C) (Oral)   Resp 18   Ht 5' 9"  (1.753 m)   Wt (!) 159.9 kg (352 lb 9 oz)   SpO2 94%   BMI 52.06 kg/m    GEN:     Sitting up in chair awake. Non-toxic.   HEENT:  Head atraumatic.  EOMI.   NECK:   Full ROM  CV:         RRR  PULM:    Normal WOB on RA. Lung sounds distant, no obvious wheezing or crackles  ABD:       Soft, NTND  EXT:        +2 pitting LE edema from ankles to upper thighs. No deformities. Full ROM.   PSYCH:  Pleasant mood, sometimes anxious. Normal affect. Normal speech.   NEURO:  No focal deficits appreciated. AOx4.     Weight: 346 (5/6) --> 344 (5/7) --> 350 (5/9) --> 352 (5/10) --> 352 (5/11)    Key labs, imaging, other tests:   BMP:   Recent Labs     01/25/22  0743 01/26/22  0750 01/27/22  0710   NA 138 138 140   K 3.9 4.0 3.8   CL 99 99 100   CO2 31 29 29    BUN 19* 19* 20*   CREAT 0.7 0.8 0.8   GLUCOSER 97 92 155     Ca, Mg, Phos:   Recent Labs     01/25/22  0743 01/26/22  0750 01/27/22  0710   CA 9.6 9.8 9.5   MG 2.2 2.2 2.1   PHOS 3.8 3.6 4.3     CBC:   Recent Labs     01/25/22  0743 01/26/22  0750 01/27/22  0710   WBC 6.3 6.5 6.0   HGB 12.9 13.1 12.7   HCT 40.3 40.7 41.0   PLTA 357 361 351   RBC 4.21 4.25 4.18       Imaging:    XR Chest Portable  Result Date: 01/11/2022   IMPRESSION: No acute cardiopulmonary findings on portable chest radiograph.       XR Abdomen (KUB) with Upright and or Decubitus  Result Date: 01/04/2022   IMPRESSION: 1.  Moderate to large colonic stool burden. No bowel obstruction. 2.  Suboptimal for evaluation of free air.     CT Chest WO Contrast  Result Date: 12/30/2021  IMPRESSION: Hyperinflation with dependent atelectasis and a 7 mm nodule in the right upper lobe. A follow-up chest CT is recommended in 12 months per Fleischner criteria.  XR Chest 2 views  Result Date: 12/21/2021  IMPRESSION: Suboptimal exam without evidence of acute cardiopulmonary pathology.         Discharge Medication List Noting Changes:   Current Discharge Medication List    START taking these medications    benzocaine-menthol (CEPACOL) 15-3.6 MG LOZG  Place 1 lozenge inside cheek every 2 (two) hours as needed  for up to 7 days  Qty: 20 lozenge Refills: 0    bisacodyl  (DULCOLAX) 10 MG SUPP Suppository  Place 1 suppository rectally daily as needed for Constipation  Qty: 1 suppository Refills: 0    nystatin (MYCOSTATIN) powder  Apply topically 2 (two) times daily  Qty: 15 g Refills: 0    polyethylene glycol (GLYCOLAX/MIRALAX) 17 g packet  Take 1 packet by mouth in the morning.  Qty: 30 packet Refills: 0    sennosides (SENOKOT) 8.6 MG tablet  Take 2 tablets by mouth nightly  Qty: 60 tablet Refills: 0    spironolactone (ALDACTONE) 50 MG tablet  Take 1 tablet by mouth in the morning.  Qty: 30 tablet Refills: 0    nystatin (MYCOSTATIN) 100000 UNIT/ML suspension  Take 5 mLs by mouth in the morning and 5 mLs at noon and 5 mLs in the evening and 5 mLs before bedtime. Do all this for 1 day.  Qty: 20 mL Refills: 0    LORazepam (ATIVAN) 0.5 MG tablet  Take 1 tablet by mouth 2 (two) times daily as needed for Anxiety  for up to 7 days  Qty: 14 tablet Refills: 0      CONTINUE these medications which have CHANGED    methocarbamol (ROBAXIN) 500 MG tablet  Take 1 tablet by mouth in the morning and 1 tablet at noon and 1 tablet before bedtime.  Qty: 90 tablet Refills: 0    torsemide 40 MG TABS  Take 40 mg by mouth in the morning.  Qty: 30 tablet Refills: 0    QUEtiapine (SEROQUEL) 50 MG tablet  Take 2 tablets by mouth at bedtime  Qty: 60 tablet Refills: 0    buprenorphine-naloxone (SUBOXONE) 8-2 MG sublingual tablet  Place 1 tablet under the tongue in the morning and 1 tablet at noon and 1 tablet before bedtime. Max Daily Amount: 3 tablets. Do all this for 7 days.  Qty: 21 tablet Refills: 0      CONTINUE these medications which have NOT CHANGED    albuterol HFA 108 (90 Base) MCG/ACT inhaler  Inhale 2 puffs into the lungs every 6 (six) hours as needed for Wheezing  Qty: 1 each Refills: 0    atorvastatin (LIPITOR) 80 MG tablet  Take 1 tablet by mouth in the morning.  Qty: 30 tablet Refills: 0    azithromycin (ZITHROMAX) 500 MG tablet  Take 1 tablet by mouth 3 (three) times a week  Qty: 12 tablet  Refills: 0    clotrimazole (LOTRIMIN AF) 1 % cream  Apply topically 2 (two) times daily  Qty: 15 g Refills: 0    diclofenac (VOLTAREN) 1 % GEL Gel  Apply 2 g topically 3 (three) times daily as needed (knee pain)  Qty: 100 g Refills: 0    FLUoxetine (PROZAC) 20 MG capsule  Take 1 capsule by mouth in the morning.  Qty: 30 capsule Refills: 0    fluticasone (FLONASE) 50 MCG/ACT nasal spray  1 spray by Each Nostril route 2 (two) times daily as needed (congestion)  Qty: 1 each Refills: 0    gabapentin (  NEURONTIN) 300 MG capsule  Take 2 capsules by mouth in the morning and 2 capsules at noon and 2 capsules before bedtime.  Qty: 180 capsule Refills: 0    levETIRAcetam (KEPPRA) 750 MG tablet  Take 1 tablet by mouth in the morning and 1 tablet before bedtime.  Qty: 60 tablet Refills: 0    lidocaine (SALONPAS) 4 % PTCH  Apply 1 patch topically in the morning.  Qty: 30 patch Refills: 0    Loratadine 10 MG CAPS  Take 10 mg by mouth in the morning.  Qty: 30 capsule Refills: 0    loxapine (LOXITANE) 25 MG capsule  Take 1 capsule by mouth in the morning and 1 capsule before bedtime.  Qty: 60 capsule Refills: 0    nicotine (NICODERM CQ) 21 MG/24HR  Place 1 patch onto the skin in the morning.  Qty: 30 patch Refills: 0    nicotine polacrilex (NICORETTE) 4 MG gum  Take 1 each by mouth every 2 (two) hours as needed for Craving (Nicotine)  Qty: 120 each Refills: 0    OXcarbazepine (TRILEPTAL) 150 MG tablet  Take 1 tablet by mouth in the morning and 1 tablet before bedtime.  Qty: 60 tablet Refills: 0    pantoprazole (PROTONIX) 40 MG tablet  Take 1 tablet by mouth in the morning.  Qty: 30 tablet Refills: 0    prazosin (MINIPRESS) 1 MG capsule  Take 3 capsules by mouth nightly  Qty: 90 capsule Refills: 0    aspirin 81 MG chewable tablet  Take 1 tablet by mouth in the morning.  Qty: 30 tablet Refills: 0    budesonide-formoterol (SYMBICORT) 160-4.5 MCG/ACT inhaler  Inhale 2 puffs into the lungs in the morning and 2 puffs before bedtime.  Qty:  1 each Refills: 0    levothyroxine (SYNTHROID) 25 MCG tablet  Take 1 tablet by mouth every morning before breakfast  Qty: 30 tablet Refills: 0    !! OTHER MEDICATION  Device: Inogen 3  Oxygen Flow Rate: 2-4 LPM  Titration Goal: SpO2 between 90% and 95%  Instructions:  1. Begin with 2 LPM and monitor oxygen saturation using a pulse oximeter.  2. If oxygen saturation is below 90%, increase the flow rate by 0.5 LPM every 15-30 minutes until the desired saturation level of 90%-95% is achieved.  3. If oxygen saturation is above 95%, decrease the flow rate by 0.5 LPM every 15-30 minutes until the desired saturation level of 90%-95% is achieved.  4. Once the desired saturation level is achieved, maintain the flow rate at that level.    DX: COPD with Hypoxia, Requires continuous supplemental oxygen  ICD10: Code: J44.9 and Z99.81    SIGNED Claire Shown, CNP, 01/05/2022    Nurse Practitioner   Hospitalist Division  [facility]  [address] [place] [address] [place] [address]  Phone:[phone] (fastest) or [phone]   Email:[email or web address]     NPI: [id number]  Qty: 1 Device Refills: 0    !! OTHER MEDICATION  Wheelchair: Electric  For use as directed.    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 - Potential duplicate medications found. Please discuss with provider.      STOP taking these medications    metolazone (ZAROXOLYN) 5 MG tablet          Pending Laboratory Work and Tests: None    Weight bearing  status: Full   Diet: Orders Placed This Encounter      Diet - Base Diet: Regular    Wound Care: as directed    Discharge Destination: Skilled facility     Future appointments:   Current and Future Appointments at [facility] (90 Days) 01/27/2022 - 04/27/2022      Date Visit Type Length Department Provider     01/31/2022  2:10 PM HOSPITAL FOLLOW-UP 40 min [place] [[id number]] Cora Collum, MD    Patient Instructions:                       Follow-up Information     Follow up With Specialties Details Why Contact Info    Cora Collum, MD Internal Medicine Follow up on 01/31/2022 @2 :10PM [place]  [place] [address]  [id number]  [phone]              To reach medical records at any time call the [facility] operator at [phone]     To reach the covering hospitalist, page [phone]  To reach the [facility] Laboratory for pending results, call [phone]      Signed:  Deeann Dowse, MD  PGY-1  [facility] Transitional Year Intern     Discussed with attending, Cheryl Zimmerman., MD
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
## 2022-01-17 NOTE — Discharge Inst - Reason you came to the hospital (Addendum)
Reason you came to the hospital:   Chest pain  Shortness of breath     Discharge Diagnosis:   Congestive heart failure exacerbation   Hypokalemia (low potassium)  Chronic pain    Description of Hospital Course:   You came to the hospital for chest pain and shortness of breath. Your vital signs, blood labs, and imaging were obtained. You had focused physical exams by your doctors. Your work up revealed signs of fluid overload in your body, most likely from a congestive heart failure exacerbation. Your home diuretic dosing was adjusted to treat you. Your labs were monitored daily, and you received supplemental potassium for low levels. You worked with PT to assess your strength and balance. You were safe to be discharged from the hospital to a short term rehab to continue working on your strength.     Instructions:  - Follow up with your PCP  - Take all medications as prescribed   - Continue to work on your strength with PT    Reasons to call your PCP or seek further healthcare:   - Unexplained weight gain   - Trouble breathing, chest pain, palpitations   - Increased leg or arm swelling   - Falls, or feeling off balance   - Symptoms that are new, worsening, or not improving   - Other questions about your health or medications    Call 911 for medical emergencies.    It was a pleasure taking care of you,    Your [facility] Team

## 2022-01-18 DIAGNOSIS — E876 Hypokalemia: Secondary | ICD-10-CM | POA: Diagnosis not present

## 2022-01-18 DIAGNOSIS — E873 Alkalosis: Secondary | ICD-10-CM | POA: Diagnosis not present

## 2022-01-18 DIAGNOSIS — R45851 Suicidal ideations: Secondary | ICD-10-CM | POA: Diagnosis not present

## 2022-01-18 DIAGNOSIS — E878 Other disorders of electrolyte and fluid balance, not elsewhere classified: Secondary | ICD-10-CM | POA: Diagnosis not present

## 2022-01-18 LAB — CBC WITH PLATELET
ABSOLUTE NRBC COUNT: 0 10*3/uL (ref 0.0–0.0)
HEMATOCRIT: 43.3 % (ref 34.1–44.9)
HEMOGLOBIN: 13.9 g/dL (ref 11.2–15.7)
MEAN CORP HGB CONC: 32.1 g/dL (ref 31.0–37.0)
MEAN CORPUSCULAR HGB: 30.5 pg (ref 26.0–34.0)
MEAN CORPUSCULAR VOL: 95.2 fl (ref 80.0–100.0)
MEAN PLATELET VOLUME: 10.3 fL (ref 8.7–12.5)
NRBC %: 0 % (ref 0.0–0.0)
PLATELET COUNT: 394 10*3/uL (ref 150–400)
RBC DISTRIBUTION WIDTH STD DEV: 49.4 fL — ABNORMAL HIGH (ref 35.1–46.3)
RED BLOOD CELL COUNT: 4.55 M/uL (ref 3.90–5.20)
WHITE BLOOD CELL COUNT: 5 10*3/uL (ref 4.0–11.0)

## 2022-01-18 LAB — URINALYSIS
BILIRUBIN, URINE: NEGATIVE
GLUCOSE, URINE: NEGATIVE MG/DL
KETONE, URINE: NEGATIVE MG/DL
LEUKOCYTE ESTERASE: NEGATIVE
NITRITE, URINE: NEGATIVE
OCCULT BLOOD, URINE: NEGATIVE
PH URINE: 7 (ref 5.0–8.0)
PROTEIN, URINE: NEGATIVE MG/DL
SPECIFIC GRAVITY URINE: 1.01 (ref 1.003–1.035)

## 2022-01-18 LAB — BASIC METABOLIC PANEL
ANION GAP: 14 mmol/L (ref 10–22)
ANION GAP: 11 mmol/L (ref 10–22)
BUN (UREA NITROGEN): 32 mg/dL — ABNORMAL HIGH (ref 7–18)
BUN (UREA NITROGEN): 26 mg/dL — ABNORMAL HIGH (ref 7–18)
CALCIUM: 9.7 mg/dL (ref 8.5–10.5)
CALCIUM: 9.7 mg/dL (ref 8.5–10.5)
CARBON DIOXIDE: 29 mmol/L (ref 21–32)
CARBON DIOXIDE: 31 mmol/L (ref 21–32)
CHLORIDE: 90 mmol/L — ABNORMAL LOW (ref 98–107)
CHLORIDE: 89 mmol/L — ABNORMAL LOW (ref 98–107)
CREATININE: 1.1 mg/dL (ref 0.4–1.2)
CREATININE: 0.9 mg/dL (ref 0.4–1.2)
ESTIMATED GLOMERULAR FILT RATE: >60 mL/min (ref >60)
ESTIMATED GLOMERULAR FILT RATE: >60 mL/min (ref >60)
Glucose Random: 128 mg/dL (ref 74–160)
Glucose Random: 161 mg/dL — ABNORMAL HIGH (ref 74–160)
POTASSIUM: 3.9 mmol/L (ref 3.5–5.1)
POTASSIUM: 3.6 mmol/L (ref 3.5–5.1)
SODIUM: 133 mmol/L — ABNORMAL LOW (ref 136–145)
SODIUM: 131 mmol/L — ABNORMAL LOW (ref 136–145)

## 2022-01-18 LAB — PHOSPHORUS MAGNESIUM
MAGNESIUM: 2.1 mg/dL (ref 1.6–2.6)
MAGNESIUM: 2.2 mg/dL (ref 1.6–2.6)
PHOSPHORUS: 3.3 mg/dL (ref 2.5–4.9)
PHOSPHORUS: 3.5 mg/dL (ref 2.5–4.9)

## 2022-01-18 LAB — OSMOLALITY: OSMOLALITY: 283 mosm/kg (ref 275–295)

## 2022-01-18 MED ORDER — TORSEMIDE 20 MG PO TABS
20.00 mg | ORAL_TABLET | Freq: Once | ORAL | Status: AC
Start: 2022-01-18 — End: 2022-01-18
  Administered 2022-01-18: 20 mg via ORAL
  Filled 2022-01-18: qty 1

## 2022-01-18 MED ORDER — QUETIAPINE FUMARATE 100 MG PO TABS
100.0000 mg | ORAL_TABLET | Freq: Every evening | ORAL | Status: DC
Start: 2022-01-18 — End: 2022-01-27
  Administered 2022-01-18 – 2022-01-26 (×9): 100 mg via ORAL
  Filled 2022-01-18 (×9): qty 1

## 2022-01-18 NOTE — Case Mgmt Note (Signed)
Pt discussed in MDR. Continues w/ diuretic changes for CHF. Declined by [facility] d/t O2 requirement. SW/RC looking into sober home options. Referral also sent to [facility] rehab, awaiting reply. Will cont to follow.

## 2022-01-18 NOTE — e-consult (Signed)
NEPHROLOGY E-CONSULT PROGRESS NOTE    DATE: 01/18/2022   ADMISSION DATE & TIME: 01/11/22      INTERVAL HISTORY    NAEON  Stopped diuretics as of today (yesterday got metolazone and daily torsemide 60 in AM)  K 3.9 Improving iso aggressive repletion yesterday (PO KCl 240 mg)  Cl 86>90, bicarb 32> 29, Na 133      Unknown urine outpt and wts - challenging to obtain in pt   Remains on 2L      SUBJECTIVE:    - main complaint is lower back pain, chronic  - breathing difficulty off and on not much change   - urinating less today  - still drinking 6 pitchers of water bc no appetite and water decreases pains (as before)    PHYSICAL EXAM:  BP 110/76   Pulse 85   Temp 98.3 F (36.8 C)   Resp 19   Ht 5\' 9"  (1.753 m)   Wt (!) 152.2 kg (335 lb 8 oz)   SpO2 94%   BMI 49.54 kg/m   General: NAD, alert and interactive  HEENT: MMM, nonicterictic sclear  CV: Regular, no murmur  Resp: Normal WOB, CTAB, on 2L  GI: obese  Ext: Warm, LE same size as yesterday  Neuro: oriented to situation  Psych: Affect and speech appropriate to situation      HOME MEDICATIONS:  Prior to Admission Medications   Prescriptions Last Dose Informant Patient Reported? Taking?   FLUoxetine (PROZAC) 20 MG capsule   No No   Sig: Take 1 capsule by mouth in the morning.   Loratadine 10 MG CAPS   No No   Sig: Take 10 mg by mouth in the morning.   OTHER MEDICATION   No No   Sig: Wheelchair: Electric  For use as directed.    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 Begin with 2 LPM and monitor oxygen saturation using a pulse oximeter.  2. If oxygen saturation is below 90%, increase the flow rate by 0.5 LPM every 15-30 minutes until the desired saturation level of 90%-95% is achieved.  3. If oxygen saturation is above 95%, decrease the flow rate by 0.5 LPM every 15-30 minutes  until the desired saturation level of 90%-95% is achieved.  4. Once the desired saturation level is achieved, maintain the flow rate at that level.    DX: COPD with Hypoxia, Requires continuous supplemental oxygen  ICD10: Code: J44.9 and Z99.81    SIGNED Georgianne Fick, CNP, 01/05/2022    Nurse Practitioner   Hospitalist Division  [facility]  Un[address] [place] [address] [place] [address]  Phone:[phone] (fastest) or [phone]   Email:[email or web address]     NPI: [id number]   OXcarbazepine (TRILEPTAL) 150 MG tablet   No No   Sig: Take 1 tablet by mouth in the morning and 1 tablet before bedtime.   QUEtiapine (SEROQUEL) 50 MG tablet   No No   Sig: Take 1 tablet by mouth at bedtime   Torsemide 60 MG TABS   No No   Sig: Take 120 mg by mouth in the morning and 120 mg in the evening.   albuterol HFA 108 (90 Base) MCG/ACT inhaler   No No   Sig:  Inhale 2 puffs into the lungs every 6 (six) hours as needed for Wheezing   aspirin 81 MG chewable tablet   No No   Sig: Take 1 tablet by mouth in the morning.   atorvastatin (LIPITOR) 80 MG tablet   No No   Sig: Take 1 tablet by mouth in the morning.   azithromycin (ZITHROMAX) 500 MG tablet   No No   Sig: Take 1 tablet by mouth 3 (three) times a week   budesonide-formoterol (SYMBICORT) 160-4.5 MCG/ACT inhaler   No No   Sig: Inhale 2 puffs into the lungs in the morning and 2 puffs before bedtime.   buprenorphine-naloxone (SUBOXONE) 8-2 MG sublingual tablet   No No   Sig: Place 1 tablet under the tongue in the morning and 1 tablet at noon and 1 tablet before bedtime. Max Daily Amount: 3 tablets. Do all this for 7 days.   clotrimazole (LOTRIMIN AF) 1 % cream   No No   Sig: Apply topically 2 (two) times daily   diclofenac (VOLTAREN) 1 % GEL Gel   No No   Sig: Apply 2 g topically 3 (three) times daily as needed (knee pain)   fluticasone (FLONASE) 50 MCG/ACT nasal spray   No No   Sig: 1 spray by Each Nostril route 2 (two) times daily as needed (congestion)   gabapentin (NEURONTIN) 300 MG capsule    No No   Sig: Take 2 capsules by mouth in the morning and 2 capsules at noon and 2 capsules before bedtime.   levETIRAcetam (KEPPRA) 750 MG tablet   No No   Sig: Take 1 tablet by mouth in the morning and 1 tablet before bedtime.   levETIRAcetam (KEPPRA) 750 MG tablet   No No   Sig: Take 1 tablet by mouth in the morning and 1 tablet before bedtime.   levothyroxine (SYNTHROID) 25 MCG tablet   No No   Sig: Take 1 tablet by mouth every morning before breakfast   lidocaine (SALONPAS) 4 % PTCH   No No   Sig: Apply 1 patch topically in the morning.   loxapine (LOXITANE) 25 MG capsule   No No   Sig: Take 1 capsule by mouth in the morning and 1 capsule before bedtime.   methocarbamol (ROBAXIN) 750 MG tablet   No No   Sig: Take 1 tablet by mouth in the morning and 1 tablet at noon and 1 tablet before bedtime.   metolazone (ZAROXOLYN) 5 MG tablet   No No   Sig: Take 1 tablet by mouth in the morning.   nicotine (NICODERM CQ) 21 MG/24HR   No No   Sig: Place 1 patch onto the skin in the morning.   nicotine polacrilex (NICORETTE) 4 MG gum   No No   Sig: Take 1 each by mouth every 2 (two) hours as needed for [place] (Nicotine)   nicotine polacrilex (NICORETTE) 4 MG gum   No No   Sig: Take 1 each by mouth every 2 (two) hours as needed for Craving (Nicotine)   pantoprazole (PROTONIX) 40 MG tablet   No No   Sig: Take 1 tablet by mouth in the morning.   prazosin (MINIPRESS) 1 MG capsule   No No   Sig: Take 3 capsules by mouth nightly   torsemide 60 MG TABS   No No   Sig: Take 120 mg by mouth in the morning and 120 mg at noon and 120 mg before bedtime.  Facility-Administered Medications: None        HOSPITAL MEDICATIONS:  Scheduled Meds:   aMILoride  5 mg Oral Daily    acetaminophen  1,000 mg Oral Q6H [facility]    nystatin   Topical BID    lidocaine  3 patch Topical Q24H    gabapentin  500 mg Oral TID    methocarbamol  500 mg Oral TID    polyethylene glycol  17 g Oral Daily    sennosides  17.2 mg Oral Nightly    heparin (porcine)   5,000 Units Subcutaneous Q12H [facility]    aspirin  81 mg Oral Daily    atorvastatin  80 mg Oral Daily    azithromycin  500 mg Oral Once per day on Mon Wed Fri    budesonide-formoterol  2 puff Inhalation BID    buprenorphine-naloxone  8 mg Sublingual TID    clotrimazole   Topical BID    FLUoxetine  20 mg Oral Daily    levETIRAcetam  750 mg Oral BID    levothyroxine  25 mcg Oral DAILY    loratadine  10 mg Oral Daily    loxapine  25 mg Oral BID    nicotine  1 patch Transdermal Daily    OXcarbazepine  150 mg Oral BID    pantoprazole  40 mg Oral Daily    prazosin  3 mg Oral Nightly    QUEtiapine  50 mg Oral Nightly       PRN Meds:  sennosides, LORazepam, albuterol HFA, diclofenac, nicotine polacrilex    Infusions:         RECENT DATA:  Recent labs, imaging, and EKGs personally reviewed. Relevant findings noted here and/or discussed in A&P below.    Labs:    Chemistries:  Recent Labs     01/16/22  0735 01/17/22  0720 01/17/22  1718 01/18/22  0735   NA 130* 133* 132* 133*   K 3.1* 2.9* 2.8* 3.9   CO2 30 31 32 29   BUN 32* 33* 30* 32*   CREAT 1.0 1.0 1.0 1.1   CA 9.5 9.5 9.4 9.7   MG 1.9 1.9 1.9 2.1   PHOS 3.5 3.4 3.4 3.3   ANION 13 14 14 14    GFR > 60 > 60 > 60 > 60    GI:  No results for input(s): AST, ALT, TBILI, DBILI, IBIL, ALKPHOS, GGTP, ALBUMIN, LIP in the last 72 hours.   CBC:  Recent Labs     01/17/22  0720 01/18/22  0735   WBC 6.6 5.0   HGB 13.7 13.9   HCT 41.7 43.3   PLTA 383 394    Coags:   No results for input(s): INR, PT, APTT, HPTT, FIB in the last 72 hours.   Trops, BNP, D-dimer, Lactate, C-RP:  No results for input(s): TROPI, PROBNP, DDPEDVT, DD, LACTICACID, CRP in the last 72 hours.    Finger Sticks:  No results for input(s): FINGERSTICKR in the last 72 hours. Urinalysis:  No results for input(s): UACOL, UACLA, UAGLU, UABIL, UAKET, SPEGRAVURINE, UAOCC, UAPH, UAPRO, UABAC, UARBC, UANIT, LEUKOCYTES, UABAC, UASQE in the last 72 hours.    Invalid input(s): Tedra Coupe,  UAMIC              ASSESSMENT & PLAN:  51 year old female with CHF, A fib, COPD, complex psychiatric hx with multiple hospitalizations for SA p/w chest pain and SOB. Admitted for CHF exacerbation (4/25-) and nephrology was consulted for persistent  hypokalemia.        Problems:        #Hypokalemia in the setting of hypovolemia/perpetuated by metabolic alkalosis   #metabolic alkalosis  Improving iso aggressive repletion yesterday (PO KCl 240 mg)  - continue 5 mg of amiloride for now as we figure out pt's K needs- she likely was total body Potassium depleted from overdiuresis PTA.   - BID BMP checks while on amiloride to avoid hyperkalemia  - for mtn diuretic dosing: recommend restarting with lower dose diuretics 20 mg torsemide and measure UOP for 6-12 hrs  After  -If I/Os difficult, can use foleypurewick for 24-48 hours to follow UOP  -Recommend strict I/o        #hyponatremia, hypo or euvolemic likely hypo-osmotic  Improving 133 today; likely from ongoing polydipsia w low solute diet  Serum Osm measured= 283  Accounting for her elevated urea  283- 32/2.8=283-11.4~  272  Counseled pt on restricting fluid to 2L and risks of polydipsia      #HTN  SBP 110s-130s  Pt denied orthostatic sx  ctm  Hold diuretics as above    #AKI, resolved  Had AKI 4/27 HD2 iso aggressive diuresis with furosemide 120 BID on 4/26 and metolazone 5 daily; 4/28 torsemide was dec to 60 mg QD, now back to baseline      Summary of recs:  - Afternoon BMP to check K while on amiloride  - Trial torsemide 20 mg PO today, closely measure UOP for 6-12 hours after with purewick or foley if necessary  - fluid restriction, continue to counsel pt on limiting fluid intake      VTE PPx: heparin subq bid    Diet: Diet - Base Diet: Regular  Code: Full Code  Contact/HCP: Data Unavailable Data Unavailable  PCP: [name], MD        Elyse Hsu, MD, 01/18/2022  Nephrology consult  PGY3 [phone]  Discussed with Dr Marisa Sprinkles

## 2022-01-18 NOTE — Progress Notes (Signed)
SW contacted [facility] liaison Caitlin who recommends reaching out to [employer] [phone] or [email or web address].     SW left voicemail for [employer] and will await for call back.     SW will remain available

## 2022-01-18 NOTE — Progress Notes (Signed)
Pt was discussed in MDR.     SW spoke w/ pt who states "I am seeing my dead son. I know I am hallucinating and I want to see psych." Pt denies SI/HI at this time. SW notified MD.     Pt reports no additional concerns at this time.     SW will continue to follow

## 2022-01-18 NOTE — Progress Notes (Signed)
PROGRESS NOTE  01/18/2022    PATIENT INFO: Cheryl Zimmerman, [age] year old female  DATE OF ADMISSION: 01/11/22    ROOM/BED LOCATION:  423/423-A  HOSPITAL DAY: 7    REVIEW OF OVERNIGHT EVENTS:  Was having anxiety about her dispo plan, given Ativan    SUBJECTIVE:  Was having ongoing anxiety regarding her finances. Feeling exhausted, poor sleep, ongoing stress. No trouble breathing or dizziness.     OBJECTIVE:  BP: (105-116)/(69-76)   Temp:  [98 F (36.7 C)-98.5 F (36.9 C)]   Pulse:  [71-91]   Resp:  [18-20]   SpO2:  [90 %-94 %]     INS/OUTS (PAST 24 HOURS)  No intake/output data recorded.    PHYSICAL EXAM   GEN:   Sitting up on edge of bed, conversant  HEENT:    EOMI  BECK:   Full ROM  CV:      RRR  PULM:      Difficult to appreciate lung sounds. No obvious crackles or wheezing. Normal WOB RA.   ABD:      NTND  EXT:      Trace LE edema, no deformities   NEURO:     A&Ox4. Moving all extremities spontaneously     RECENT LABS  BMP:   Recent Labs     01/15/22  1030 01/16/22  0735 01/17/22  0720 01/17/22  1718   NA 135* 130* 133* 132*   K 3.6 3.1* 2.9* 2.8*   CL 90* 87* 88* 86*   CO2 30 30 31  32   BUN 30* 32* 33* 30*   CREAT 1.2 1.0 1.0 1.0   GLUCOSER 117 152 160 123     Ca, Mg, Phos:   Recent Labs     01/15/22  1030 01/16/22  0735 01/17/22  0720 01/17/22  1718   CA 9.9 9.5 9.5 9.4   MG 2.0 1.9 1.9 1.9   PHOS 3.3 3.5 3.4 3.4     CBC:   Recent Labs     01/17/22  0720   WBC 6.6   HCT 41.7   HGB 13.7   PLTA 383   RBC 4.44       MICROBIOLOGY REVIEW  No new microbiology results to review.    IMAGING AND OTHER STUDIES  Telemetry notable findings: none.    MEDICATIONS  See list in EPIC    ASSESSMENT & PLAN   51 F with CHF, A. Fib, COPD, bronchial asthma, on 2L home O2, seizure disorder on keppra, BPD, Schizoaffective disorder, polysubstance use on buprenorphine maintenance, PTSD, multiple SA, active cocaine use, obesity presented with chest pain and wasadmitted for SI, hypoK 2.6,substernal chest pressure,SOB  consistent with CHF exacerbation. Iinitially on active SI precautions, now no longer on active SI precautions and does not meet criteria for inpatient psych. C/c/b hypokalemia and difficulty with safe discharge planning.    #Electrolyte Abnormalities   #Hypokalemia   #Hypochloremia   #Metabolic Alkalosis   Patient has had multiple electrolyte abnormalities since inial hospitalization. Patient has required aggressive potassium repletion in particular, requiring over 100 mEq of potassium most days. Hypokalemia and metabolic alkalosis initially thought to be due to patient's agressive diuresis plan. Though urine Cl very elevated in 50s, which would not be expected with diuresis. Patient has no signs of hyperaldosteronism, has no diarrhea or vommiting. If diuretics are not the main cause of electrolyte imbalances, differential includes possibly Gitelman syndrome (which can appear in adulthood) as patient has muscle cramps and  spasms, polyuria, metabolic alkalosis, low Cl and severe hypokalemia though Mg levels have been normal and K on chart review has been normal expect for the past month and would assume that for genetic condition that would be slow increase in K.   - After Aggressive K repletion yesterday, her K is now wnl  - Holding torsemide and metolazone today, likely definitive discontinuation of metolazone as no clear indication  - Recheck afternoon lytes   - Consider resuming Torsemide at lower dose depending on afternoon BMP  - Amiloride instead of her prior spironolactone  - nephrology following, appreciate recs    - may need chronic K supplementation at discharge     #SI  #Polysubstance Use   #Housing Insecurity  Presented with active SI without plan. High risk due to multiplerecent hospitalizations 2/2 multiplesuicideattempts. Has been verbally aggressive to staff and has refused labs or medication at times. Initially on 1:1 sitter, psych was consulted and eventually patient no longer needed a sitter  and was safe from a psychiatric perspective for discharge.   - SW consulted, appreciate recs  - Psych consulted, appreciate recs:  Does not meet section 12 criteria. No psychiatric contraindication to discharge.       #HFpEF (EF 70%)  Patient found to be fluid overloaded on admission. On torsemide 120 mg BID, though unclear patient's compliance. Patient's volume status inpatient  has been difficult to determine due to patient's body habitus, baseline O2 requirement, and likely chronic leg edema. Strict I/Os have been ordered as well as daily standing weights but I/Os have been difficult to accurately record and patient is refusing standing weights. Patient as continued on home diuretic until AKI developed and home diuretic was   TI: Consider R heart cath outpatient   - torsemide at 60 BID home dose - holding while monitoring lytes   - discontinued home metolazone 5/1  - Spirono --> amiloride changed 5/1  - Difficult volume exam, likely resume Torsemide home dosing at lower dose for maintenance     #Chest Pain - resolved   Initially presented with chest pain on admission. Trops mildly elevated but stable. EKG showed ST depressions seen on prior but no reciprocal changes. Chest pain seems to be exertional and/pr [leuritic  so differential mainly stable angina vs type 2 NSTEMI from CHF exacerbation.  - monitor on tele   TI: consider ischemic eval outpatient     #AKI   Creat baseline ~0.8. Developed AKI, likely iso overdiuresis. Cr today 1.1.  - Holding morning diuresis as above, recheck afternoon BMP    #Hyponatremia  Hyponatremic to 131 on admission and continues to be hyponatremic throughou hospitalization, though levels fluctuating. Urine Osm 89 and Urine Na <20 then 37. Hyponatremia etiology likely has changed with fluctuating volume status. Likely due to low effective circulating volume due to CHF exacerbation but was later caused by aggressive diuretics. Unclear why urine osm so low as patient did not seem to  have primary polydipsia.   - Diuresis as above    #Chronic pain  #Somnolence   Pt with recurrence of burning shooting pain from hips/lower back to anterior thighs, to her shins. Has known spinal stenosis with sciatica. On many sedating medications for pain, which have been adjusted due to somnolence.   - continue clotrimazole 1% BID, diclofenac 1% 2g TID, Lidocane patch 3 qam,  - Continue methocarbamol 750 mg TID  - Continue Lorazepam 0.5 mg TID PRN   - Continue Gabapentin 500 mg BID    #  Constipation  Chronic constipation. Didn't have bowel movement for about 1-2 weeks on initial presentation. On miralax at home. Senna has helped in previous hospitalizations.   - Senna and Miralax scheduled   - PRN enema     #Isolated Elevated AlkPhos   Elevated on admission. No RUQ pain. Other LFTs WNL.   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 On 2L NC at home, but was only recently started on home O2 within the past month . Supplemental O2 has been titrated iso of both baseline COPD and O2 requirement on top of COPD exacerbation. Continues to beafebrile, withouttachypnea, withoutuse of accessory muscles, andno coughing. O2   - Contine home budesonide-formoterol, albutero q6h prn, fluticasone prn  - Continue NC 2 L    #Polycythemia - Resolved  Polycythemiaresolving, likely due toreturn ofintravascular volume.  -continue to monitor    #Leukocytosis - Resolved  Normalized.A febrile and hemodynamically stable.  -continue to monitor    Telemetry: Yes    Foley: No    FEN: diabetes diet, PO    Prophylaxis: heparin 5,000  units    Medication Reconciliation: Done    Code Status  Full Code    Health care proxy: Data Unavailable      Dispo: pending appropriate titration of home diuretic dose with stable electrolytes      Vena Rua, MD  PGY-1      Discussed patient case and plan with attending, Anastasia Fiedler., MD

## 2022-01-18 NOTE — Progress Notes (Signed)
Psychiatry CL Service Follow-Up Note    Prior psychiatry consult notes as well as interim history reviewed in EPIC.   ID:    [name] is a 51 years old woman with chronic mental illness who recently left her group home, rendering her homeless. Following a medical admission at [facility] where he was followed by the psychiatric consult service, she left to the streets, and came back to our ED for suicidal ideation.  Pt well known to ourservice from recent consultation and admission to inpatient psychiatry in recent months. She was discharged from the [facility] on 4/20 and then represented to the ED on ?4/25 with a  Complaint of suicideal ideation but was found to have COPD exacerbation and required brief hospitalization for medical stabilization.     Interval Events:   NAEO on chart review. CL contacted by primary team with reports that pt reporting VH of her deceased son, requesting to see psychiatry.  On interview, pt calm/cooperative. Initially presented with dysphoric affect when discussing her VH, but visibly brightened when discussing discharge to sober house.  Pt reports seeing her "dead son" periodically throughout the day.  Has not noticed an association with waking up/falling asleep or certain periods of the day.  States she is seeing him more frequently (5-6x) today than she did last week (1-2x/day). States her hallucinations were getting worse yesterday but that nursing staff was ignoring her. No auditory hallucinations, including CAH.  No thoughts of self-harm or suicide.  Does not feel she would be unsafe to go to sober house.    BP 126/81   Pulse 81   Temp 98.4 ?F (36.9 ?C) (Oral)   Resp 20   Ht 5\' 9"  (1.753 m)   Wt (!) 152.2 kg (335 lb 8 oz)   SpO2 95%   BMI 49.54 kg/m?     New Relevant Labs/Studies: none    Mental Status Exam:  Mental Status Exam   General Appearance: Dressed in hospital gown/johnny  Behavior: Cooperative;Good eye contact  Level of Consciousness: Alert  Orientation  Level: Grossly Intact  Attention/Concentration: WNL  Mannerisms/Movements: Involuntary movements  Speech Quality and Rate: WNL  Speech Clarity: Clear  Speech Tone: Normal vocal inflection  Vocabulary/Fund of Knowledge: WNL  Memory: Grossly intact  Thought Process & Associations: Goal-directed;Linear;Logical;Organized  Dissociative Symptoms: None  Thought Content: No abnormalities reported or observed;Preoccupations  Delusions: None  Hallucinations: Visual (reports seeing her "dead son")  Suicidal Thoughts: None  Homicidal Thoughts: None  Mood: Anxious  Affect: Congruent with mood;Full range  Judgment: Poor  Insight: Poor    Medication:  Scheduled medications:   ? QUEtiapine  100 mg Oral Nightly   ? torsemide  20 mg Oral Once   ? aMILoride  5 mg Oral Daily   ? acetaminophen  1,000 mg Oral Q6H SCH   ? nystatin   Topical BID   ? lidocaine  3 patch Topical Q24H   ? gabapentin  500 mg Oral TID   ? methocarbamol  500 mg Oral TID   ? polyethylene glycol  17 g Oral Daily   ? sennosides  17.2 mg Oral Nightly   ? heparin (porcine)  5,000 Units Subcutaneous Q12H SCH   ? aspirin  81 mg Oral Daily   ? atorvastatin  80 mg Oral Daily   ? azithromycin  500 mg Oral Once per day on Mon Wed Fri   ? budesonide-formoterol  2 puff Inhalation BID   ? buprenorphine-naloxone  8 mg  Sublingual TID   ? clotrimazole   Topical BID   ? FLUoxetine  20 mg Oral Daily   ? levETIRAcetam  750 mg Oral BID   ? levothyroxine  25 mcg Oral DAILY   ? loratadine  10 mg Oral Daily   ? loxapine  25 mg Oral BID   ? nicotine  1 patch Transdermal Daily   ? OXcarbazepine  150 mg Oral BID   ? pantoprazole  40 mg Oral Daily   ? prazosin  3 mg Oral Nightly       As needed medications:   Current Facility-Administered Medications   Medication Dose Route Frequency Last Admin   ? sennosides  17.2 mg Oral Q12H PRN 17.2 mg at 01/14/22 1244   ? LORazepam  0.5 mg Oral TID PRN 0.5 mg at 01/18/22 0955   ? albuterol HFA  2 puff Inhalation Q6H PRN 2 puff at 01/16/22 2054   ?  diclofenac  2 g Topical TID PRN 2 g at 01/14/22 1600   ? nicotine polacrilex  4 mg Oral Q2H PRN 4 mg at 01/18/22 1601        Assessment/Impressions:   Agree w/ Dr. Penni Bombard Initial Formulation:    "51 yo WF with long standing mental health problems who self presented to the ED with complaints of SI a few days after discharged. She continues to report non specific SI without a specific plan. She continues to want to have a sitter present with her but again is indicating she might "soon be no longer suicidal" avoiding inpatient psychiatric care. I strongly sustpect secondary gain given that she is recently homeless as a result of her declining to return to her group home."    Pt now reporting more frequent visual hallucinations of her deceased son, though denies other psychotic symptoms and denies other safety concerns.  Pt overall demonstrating organized behavior and speech during interview. Based on pt's absence of other symptoms and her history, suspect that pt's hallucinations most likely represent a trauma sequelae rather than psychotic decompensation.  Additionally, per chart review and discussion with Dr. Dimas Aguas, pt has previously reported worsening VH while boarding on the medical floor after several days of stability.  As pt emphasizes her hallucinations in context of feeling ignored by staff, possible that pt's report of symptoms is motivated by anxiety/feeling neglected.     In regards to overall safety, agree with prior safety assessment below:    In terms of risk, patient has numerous risk factors for suicide/inadvertent self-harm due to grave disability self including MMI, homelessness/financial issues, poor social support, hopelessness/helplessness, substance use, impulsivity/poor executive function, recent losses    These risks, though significant, are mitigated by patient'sability to contact for safety, numerous community supports, help-seeking behaviors, genuine desire to recover, ability to list  things worth living for, future-oriented thinking, access to mental health care    On re-evaluation, pt continues to deny SI w/ active plan or intent, HI, and exhibits an ability to attend to her ADLs.  Pt does not meet section 12 criteria.  Additionally, based on Dr. Jeannette How initial assessment, there is significant concern for secondary gain given pt's presentation as a result of declining to return to her group home.            DSM 5 Diagnoses:  Primary Psychiatric Diagnosis: major depressive disorder  Secondary Psychiatric Diagnosis:  Borderline personality disorder  Psychosocial Stressors: housing problems, financial problems    Recommendations:   1. Discontinue 1:1 and  safety tray.  2. Pt does NOT meet section 12 criteria.  No psychiatric contraindication to discharge.  Pt may leave AMA  3. Recommend primary team collaborate with outpt SW Leeroy BockChelsea [phone] regarding dispo  4. Can increase nightly quetiapine to 100 mg qHS to target sleep, AH, subjective feelings of distress. CL to followup and titrate accordingly.  4. Anticipated level of care upon discharge- home w/ services  5. Recommendations discussed/to be discussed with Eden LatheMark Howard, MD    Thank you for involving us in this patient's care. Please contact us for any new or on-going concerns.    For on-going clinical needs after 5 pm and until 8:30 am and on weekends, please page on call psychiatrist for urgent clinical matters at [phone]. Please input brief message of reason for call and location (e.g. [address], [address] [place], and Labor & Delivery), as the psychiatrist on call is covering multiple units in the hospital.       Jos? Dom?nguez, MD MPH  01/18/22 4:42 PM  PGY-2, Adult Psychiatry  [facility]  [phone]

## 2022-01-18 NOTE — Plan of Care (Addendum)
Pt is AOX4, on RA 02sat:88-91%,  PRN ativan given for anxiety with good effect, No acute distress noted, cont on pain management with good effect, appetite and po intake good, tolerated all po meds well, cont care provided, independent in room , medically clear, awaiting for bed, Urine collected , safety precaution maintained, call light within reach,will cont with plan of care.   Problem: Activity:  Goal: Mobility will be supported throughout the hospitalization,  Outcome: Progressing     Problem: Cognitive:  Goal: Caregivers and patients knowledge of risk factors and measures for prevention of condition will be supported throughout the hospitalization  Outcome: Progressing     Problem: Safety:  Goal: Will remain free from falls throughout the hospitalization,  Outcome: Progressing     Problem: Knowledge Deficit  Goal: Patient/S.O. demonstrates understanding of disease process, treatment plan, medications, and discharge instructions.  Description: Complete learning assessment and assess knowledge base  Outcome: Progressing     Problem: Inadequate Coping  Goal: Demonstrates ability to cope effectively  Description: Patient is able to verbalize feelings related to emotional state.  Outcome: Progressing  Goal: Verbalizes adaptive coping mechanisms  Description: Able to verbalize adaptive coping mechanisms such as physical activity, distraction, and deep breathing exercises.  Outcome: Progressing     Problem: Potential for Suicide  Goal: Remain free from self harm  Description: Assess suicide risk on admission and per hospital policy.  Assess and monitor patient's mood and behaviors which may signal an increase in suicidal potential, statements such as "I wish I were dead", acting recklessly, giving away personal possessions, or suicide notes.  Assess patient for symptoms of post-traumatic stress disorder.  Implement suicide precautions per hospital policy.  Collaborate with interdisciplinary team and initiate plan and  interventions as ordered.  Outcome: Progressing

## 2022-01-19 DIAGNOSIS — E873 Alkalosis: Secondary | ICD-10-CM | POA: Diagnosis not present

## 2022-01-19 DIAGNOSIS — R45851 Suicidal ideations: Secondary | ICD-10-CM | POA: Diagnosis not present

## 2022-01-19 DIAGNOSIS — E876 Hypokalemia: Secondary | ICD-10-CM | POA: Diagnosis not present

## 2022-01-19 DIAGNOSIS — E878 Other disorders of electrolyte and fluid balance, not elsewhere classified: Secondary | ICD-10-CM | POA: Diagnosis not present

## 2022-01-19 LAB — BASIC METABOLIC PANEL
ANION GAP: 10 mmol/L (ref 10–22)
ANION GAP: 12 mmol/L (ref 10–22)
BUN (UREA NITROGEN): 29 mg/dL — ABNORMAL HIGH (ref 7–18)
BUN (UREA NITROGEN): 27 mg/dL — ABNORMAL HIGH (ref 7–18)
CALCIUM: 9.5 mg/dL (ref 8.5–10.5)
CALCIUM: 10 mg/dL (ref 8.5–10.5)
CARBON DIOXIDE: 33 mmol/L — ABNORMAL HIGH (ref 21–32)
CARBON DIOXIDE: 31 mmol/L (ref 21–32)
CHLORIDE: 90 mmol/L — ABNORMAL LOW (ref 98–107)
CHLORIDE: 90 mmol/L — ABNORMAL LOW (ref 98–107)
CREATININE: 1 mg/dL (ref 0.4–1.2)
CREATININE: 0.9 mg/dL (ref 0.4–1.2)
ESTIMATED GLOMERULAR FILT RATE: >60 mL/min (ref >60)
ESTIMATED GLOMERULAR FILT RATE: >60 mL/min (ref >60)
Glucose Random: 111 mg/dL (ref 74–160)
Glucose Random: 128 mg/dL (ref 74–160)
POTASSIUM: 3.2 mmol/L — ABNORMAL LOW (ref 3.5–5.1)
POTASSIUM: 3.7 mmol/L (ref 3.5–5.1)
SODIUM: 133 mmol/L — ABNORMAL LOW (ref 136–145)
SODIUM: 133 mmol/L — ABNORMAL LOW (ref 136–145)

## 2022-01-19 LAB — SODIUM RANDOM URINE: SODIUM RANDOM URINE: 30 mmol/L

## 2022-01-19 LAB — CBC WITH PLATELET
ABSOLUTE NRBC COUNT: 0 10*3/uL (ref 0.0–0.0)
HEMATOCRIT: 41.4 % (ref 34.1–44.9)
HEMOGLOBIN: 13.6 g/dL (ref 11.2–15.7)
MEAN CORP HGB CONC: 32.9 g/dL (ref 31.0–37.0)
MEAN CORPUSCULAR HGB: 30.8 pg (ref 26.0–34.0)
MEAN CORPUSCULAR VOL: 93.9 fl (ref 80.0–100.0)
MEAN PLATELET VOLUME: 10.4 fL (ref 8.7–12.5)
NRBC %: 0 % (ref 0.0–0.0)
PLATELET COUNT: 400 10*3/uL (ref 150–400)
RBC DISTRIBUTION WIDTH STD DEV: 48.3 fL — ABNORMAL HIGH (ref 35.1–46.3)
RED BLOOD CELL COUNT: 4.41 M/uL (ref 3.90–5.20)
WHITE BLOOD CELL COUNT: 4.3 10*3/uL (ref 4.0–11.0)

## 2022-01-19 LAB — PHOSPHORUS MAGNESIUM
MAGNESIUM: 2.1 mg/dL (ref 1.6–2.6)
MAGNESIUM: 2.1 mg/dL (ref 1.6–2.6)
PHOSPHORUS: 3.5 mg/dL (ref 2.5–4.9)
PHOSPHORUS: 3.3 mg/dL (ref 2.5–4.9)

## 2022-01-19 LAB — HOLD UC GRAY TUBE

## 2022-01-19 LAB — OSMOLALITY RANDOM URINE: OSMOLALITY RANDOM URINE: 510 mosm/kg (ref 50–1200)

## 2022-01-19 LAB — POTASSIUM RANDOM URINE: POTASSIUM RANDOM URINE: 59 mmol/L

## 2022-01-19 MED ORDER — TORSEMIDE 10 MG PO TABS
10.0000 mg | ORAL_TABLET | Freq: Every day | ORAL | Status: DC
Start: 2022-01-19 — End: 2022-01-20
  Administered 2022-01-19 – 2022-01-20 (×2): 10 mg via ORAL
  Filled 2022-01-19 (×2): qty 1

## 2022-01-19 MED ORDER — POTASSIUM CHLORIDE CRYS ER 20 MEQ PO TBCR
40.00 meq | EXTENDED_RELEASE_TABLET | ORAL | Status: AC
Start: 2022-01-19 — End: 2022-01-19
  Administered 2022-01-19 (×3): 40 meq via ORAL
  Filled 2022-01-19 (×3): qty 2

## 2022-01-19 NOTE — RN Shift Note (Addendum)
A&Ox4, calm and cooperative with care. Anxious, continues to endorse visual hallucinations of son, emotional support provided and PRN ativan given per pt request. Meds whole with liquids. C/o pain in lower back and bilateral knees, chronic, voltaren gel and ice applied. VSS. 2L NC, baseline. Ambulates independently with RW, hat in place.+1 BLE edema. Sleeping well overnight, scheduled tylenol not given due to pt sleeping. Call light within reach. Safety maintained.

## 2022-01-19 NOTE — Progress Notes (Signed)
Pt discussed during MDR.     SW spoke w/ Etta Grandchild LCSW @ [facility] who is familiar with pt's case. [name] informed SW of her thoughts relating to pt's d/c from [facility]. SW will look into transitional shelter.     SW spoke w/ Vikki Ports at [facility] and updated her on pt's d/c plan. [name] confirms that her team at [facility] had a meeting today and will be meeting again tomorrow. [name] asks that Nurse CM contact Eartha Inch [phone] the nurse manager at [facility] as she has questions.     SW will continue to follow.

## 2022-01-19 NOTE — Consults (Signed)
NEPHROLOGY CONSULT  NOTE    DATE: 01/19/2022   ADMISSION DATE & TIME: 01/11/22      Pt summary  This is a 51 year old female with CHF, A fib, COPD (now 2L NC at home), obesity, complex psychiatric hx with multiple hospitalizations for SA p/w chest pain and SOB. Admitted for CHF exacerbation; nephrology consulted for persistent hypok despite aggressive PO repletion, formulated to be 2/2 overdiuresis with recent med changes in April psych hospitalizations perpetuated by hypovolemia induced metabolic alkalosis    For details pls see initial consult note on 5/1    INTERVAL HISTORY    NAEON  Afternoon K with amiloride given at 10 am= 3.6>   Torsemide 20 mg PO given yesterday ~5pm  UOP 1.35 L 1700 to 2100, no urines recordeded after   If PO accurately recorded, past 24 hours net -930 (in 1L, out 1950cc)    K 3.2 this AM, Cl similar 89>90, bicarb 29> 31>33  Mag remained normal    Vitals unchanged      SUBJECTIVE:    - similar, chronic back pain main concern. Trying to drink less, now1-2 L water in gray pitcher, 4 cups of juice ( ~3L)      PHYSICAL EXAM:  BP (P) 115/77   Pulse 80   Temp 98 F (36.7 C) (Oral)   Resp (P) 18   Ht 5\' 9"  (1.753 m)   Wt (!) 152.2 kg (335 lb 8 oz)   SpO2 95%   BMI 49.54 kg/m   General: NAD, alert and interactive  HEENT: MMM, nonicterictic sclear  CV: Regular  Resp: Normal WOB, CTAB. Room air speaking in full sentences  GI: obese  Ext: Warm, trace edema on bilateral shins  Neuro: alert and orienteded to person situation place CN II-XII grossly intact, moving all extremities  Psych: Affect and speech appropriate to situation      HOME MEDICATIONS:  Prior to Admission Medications   Prescriptions Last Dose Informant Patient Reported? Taking?   FLUoxetine (PROZAC) 20 MG capsule   No No   Sig: Take 1 capsule by mouth in the morning.   Loratadine 10 MG CAPS   No No   Sig: Take 10 mg by mouth in the morning.   OTHER MEDICATION   No No   Sig: Wheelchair: Electric  For use as directed.    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 Begin with 2 LPM and monitor oxygen saturation using a pulse oximeter.  2. If oxygen saturation is below 90%, increase the flow rate by 0.5 LPM every 15-30 minutes until the desired saturation level of 90%-95% is achieved.  3. If oxygen saturation is above 95%, decrease the flow rate by 0.5 LPM every 15-30 minutes until the desired saturation level of 90%-95% is achieved.  4. Once the desired saturation level is achieved, maintain the flow rate at that level.    DX: COPD with Hypoxia, Requires continuous supplemental oxygen  ICD10: Code: J44.9 and Z99.81    SIGNED Georgianne Fick, CNP, 01/05/2022    Nurse Practitioner   Hospitalist Division  [facility]  [address] [place] [address] [place] [address]  Phone:[phone] (fastest) or [phone]   Email:[email or web address]     NPI:  [id number]   OXcarbazepine (TRILEPTAL) 150 MG tablet   No No   Sig: Take 1 tablet by mouth in the morning and 1 tablet before bedtime.   QUEtiapine (SEROQUEL) 50 MG tablet   No No   Sig: Take 1 tablet by mouth at bedtime   Torsemide 60 MG TABS   No No   Sig: Take 120 mg by mouth in the morning and 120 mg in the evening.   albuterol HFA 108 (90 Base) MCG/ACT inhaler   No No   Sig: Inhale 2 puffs into the lungs every 6 (six) hours as needed for Wheezing   aspirin 81 MG chewable tablet   No No   Sig: Take 1 tablet by mouth in the morning.   atorvastatin (LIPITOR) 80 MG tablet   No No   Sig: Take 1 tablet by mouth in the morning.   azithromycin (ZITHROMAX) 500 MG tablet   No No   Sig: Take 1 tablet by mouth 3 (three) times a week   budesonide-formoterol (SYMBICORT) 160-4.5 MCG/ACT inhaler   No No   Sig: Inhale 2 puffs into the lungs in the morning and 2 puffs before bedtime.   buprenorphine-naloxone (SUBOXONE) 8-2 MG sublingual tablet   No No    Sig: Place 1 tablet under the tongue in the morning and 1 tablet at noon and 1 tablet before bedtime. Max Daily Amount: 3 tablets. Do all this for 7 days.   clotrimazole (LOTRIMIN AF) 1 % cream   No No   Sig: Apply topically 2 (two) times daily   diclofenac (VOLTAREN) 1 % GEL Gel   No No   Sig: Apply 2 g topically 3 (three) times daily as needed (knee pain)   fluticasone (FLONASE) 50 MCG/ACT nasal spray   No No   Sig: 1 spray by Each Nostril route 2 (two) times daily as needed (congestion)   gabapentin (NEURONTIN) 300 MG capsule   No No   Sig: Take 2 capsules by mouth in the morning and 2 capsules at noon and 2 capsules before bedtime.   levETIRAcetam (KEPPRA) 750 MG tablet   No No   Sig: Take 1 tablet by mouth in the morning and 1 tablet before bedtime.   levETIRAcetam (KEPPRA) 750 MG tablet   No No   Sig: Take 1 tablet by mouth in the morning and 1 tablet before bedtime.   levothyroxine (SYNTHROID) 25 MCG tablet   No No   Sig: Take 1 tablet by mouth every morning before breakfast   lidocaine (SALONPAS) 4 % PTCH   No No   Sig: Apply 1 patch topically in the morning.   loxapine (LOXITANE) 25 MG capsule   No No   Sig: Take 1 capsule by mouth in the morning and 1 capsule before bedtime.   methocarbamol (ROBAXIN) 750 MG tablet   No No   Sig: Take 1 tablet by mouth in the morning and 1 tablet at noon and 1 tablet before bedtime.   metolazone (ZAROXOLYN) 5 MG tablet   No No   Sig: Take 1 tablet by mouth in the morning.   nicotine (NICODERM CQ) 21 MG/24HR   No No   Sig: Place 1 patch onto the skin in the morning.   nicotine polacrilex (NICORETTE) 4 MG gum   No No   Sig: Take 1 each by mouth every 2 (two) hours as needed for Craving (Nicotine)   nicotine polacrilex (NICORETTE) 4 MG gum   No No  Sig: Take 1 each by mouth every 2 (two) hours as needed for Craving (Nicotine)   pantoprazole (PROTONIX) 40 MG tablet   No No   Sig: Take 1 tablet by mouth in the morning.   prazosin (MINIPRESS) 1 MG capsule   No No   Sig: Take 3  capsules by mouth nightly   torsemide 60 MG TABS   No No   Sig: Take 120 mg by mouth in the morning and 120 mg at noon and 120 mg before bedtime.      Facility-Administered Medications: None        HOSPITAL MEDICATIONS:  Scheduled Meds:   QUEtiapine  100 mg Oral Nightly    aMILoride  5 mg Oral Daily    acetaminophen  1,000 mg Oral Q6H SCH    nystatin   Topical BID    lidocaine  3 patch Topical Q24H    gabapentin  500 mg Oral TID    methocarbamol  500 mg Oral TID    polyethylene glycol  17 g Oral Daily    sennosides  17.2 mg Oral Nightly    heparin (porcine)  5,000 Units Subcutaneous Q12H [facility]    aspirin  81 mg Oral Daily    atorvastatin  80 mg Oral Daily    azithromycin  500 mg Oral Once per day on Mon Wed Fri    budesonide-formoterol  2 puff Inhalation BID    buprenorphine-naloxone  8 mg Sublingual TID    clotrimazole   Topical BID    FLUoxetine  20 mg Oral Daily    levETIRAcetam  750 mg Oral BID    levothyroxine  25 mcg Oral DAILY    loratadine  10 mg Oral Daily    loxapine  25 mg Oral BID    nicotine  1 patch Transdermal Daily    OXcarbazepine  150 mg Oral BID    pantoprazole  40 mg Oral Daily    prazosin  3 mg Oral Nightly       PRN Meds:  sennosides, LORazepam, albuterol HFA, diclofenac, nicotine polacrilex        RECENT DATA:  Recent labs, imaging, and EKGs personally reviewed. Relevant findings noted here and/or discussed in A&P below.    Labs:    Chemistries:  Recent Labs     01/17/22  0720 01/17/22  1718 01/18/22  0735 01/18/22  1440 01/19/22  0712   NA 133* 132* 133* 131* 133*   K 2.9* 2.8* 3.9 3.6 3.2*   CO2 31 32 29 31 33*   BUN 33* 30* 32* 26* 29*   CREAT 1.0 1.0 1.1 0.9 1.0   CA 9.5 9.4 9.7 9.7 9.5   MG 1.9 1.9 2.1 2.2 2.1   PHOS 3.4 3.4 3.3 3.5 3.5   ANION 14 14 14 11 10    GFR > 60 > 60 > 60 > 60 > 60    GI:  No results for input(s): AST, ALT, TBILI, DBILI, IBIL, ALKPHOS, GGTP, ALBUMIN, LIP in the last 72 hours.   CBC:  Recent Labs     01/17/22  0720 01/18/22  0735  01/19/22  0712   WBC 6.6 5.0 4.3   HGB 13.7 13.9 13.6   HCT 41.7 43.3 41.4   PLTA 383 394 400    Coags:   No results for input(s): INR, PT, APTT, HPTT, FIB in the last 72 hours.   Trops, BNP, D-dimer, Lactate, C-RP:  No results for input(s): TROPI, PROBNP, DDPEDVT,  DD, LACTICACID, CRP in the last 72 hours.    Finger Sticks:  No results for input(s): FINGERSTICKR in the last 72 hours. Urinalysis:    Recent Labs     01/18/22  1657   UACOL YELLOW   UACLA SL CLOUDY*   UAGLU NEGATIVE   UABIL NEGATIVE   UAKET NEGATIVE   SPEGRAVURINE 1.010   UAOCC NEGATIVE   UAPH 7.0   UAPRO NEGATIVE   UANIT NEGATIVE   LEUKOCYTES NEGATIVE            ASSESSMENT & PLAN:  51 year old female with CHF, A fib, COPD, complex psychiatric hx with multiple hospitalizations for SA p/w chest pain and SOB. Admitted for CHF exacerbation(4/25-)and nephrology was consulted for persistent hypokalemia.        Problems:        #Hypokalemiain the setting ofhypovolemia/perpetuated bymetabolic alkalosis  #metabolic alkalosis   #HFpEF  - still working on finding right balance of K supplementation needs  - continue5 mg of amiloride for now as we figure out pt's K needs  - she likely was total body Potassium depleted from overdiuresis PTA.   - continue to replete PO K with goal normal range  - urine PH 7, met alkalosis correcting  - BID BMP checks while on amiloride to avoid hyperkalemia  - for mtn diuretic dosing: she had active diuresis on 20 mg of torsemide; agree with primary team plan of lowering from 20 mg torsemide to 10 mg torsemide and measure UOP for 6-12 hrs    Goal would be 0 to -500 cc/s net for the day (pt was nearly -1L yesterday)  -If I/Os difficult, can use foley/ purewick for 24-48 hrs to follow UOP. 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 In any case pt had profound hypokalemia iso thiazides.         #AKI, resolved  Had AKI 4/27 HD2 iso aggressive diuresis now back to baseline            Diet: Diet - Base Diet: Regular  Code: Full Code  Contact/HCP: Data Unavailable Data Unavailable  PCP: [name], MD        Elyse Hsu, MD, 01/19/2022  Nephrology consult  PGY3 [phone]  Discussed with Dr Marisa Sprinkles

## 2022-01-19 NOTE — Progress Notes (Signed)
PROGRESS NOTE  01/19/2022    PATIENT INFO: Cheryl Zimmerman, [age] year old female  DATE OF ADMISSION: 01/11/22    ROOM/BED LOCATION:  423/423-A  HOSPITAL DAY: 8    REVIEW OF OVERNIGHT EVENTS:  Continues with visual hallucinations of son     SUBJECTIVE:  Patient very tired this morning.     OBJECTIVE:  BP: (108-126)/(77-81)   Temp:  [98 F (36.7 C)-98.4 F (36.9 C)]   Pulse:  [80-81]   Resp:  [18-20]   SpO2:  [95 %]     INS/OUTS (PAST 24 HOURS)  I/O 24 Hrs:  In: 240 [P.O.:240]  Out: 1750 [Urine:1750]    PHYSICAL EXAM   GEN:   Sitting up in chair asleep. Very drowsy, opens eyes to touch and loud voice.  HEENT:    Head atraumatic.   CV:      RRR  PULM:      Difficult to appreciate lung sounds. No obvious crackles or wheezing. No accessory mm use. RA.   ABD:      Obese, NTND  EXT:      +1 LE edema, no deformities   NEURO:     Somnolent.     RECENT LABS  BMP:   Recent Labs     01/17/22  0720 01/17/22  1718 01/18/22  0735 01/18/22  1440 01/19/22  0712   NA 133* 132* 133* 131* 133*   K 2.9* 2.8* 3.9 3.6 3.2*   CL 88* 86* 90* 89* 90*   CO2 31 32 29 31 33*   BUN 33* 30* 32* 26* 29*   CREAT 1.0 1.0 1.1 0.9 1.0   GLUCOSER 160 123 128 161* 111     Ca, Mg, Phos:   Recent Labs     01/17/22  0720 01/17/22  1718 01/18/22  0735 01/18/22  1440 01/19/22  0712   CA 9.5 9.4 9.7 9.7 9.5   MG 1.9 1.9 2.1 2.2 2.1   PHOS 3.4 3.4 3.3 3.5 3.5     CBC:   Recent Labs     01/17/22  0720 01/18/22  0735 01/19/22  0712   WBC 6.6 5.0 4.3   HCT 41.7 43.3 41.4   HGB 13.7 13.9 13.6   PLTA 383 394 400   RBC 4.44 4.55 4.41       MICROBIOLOGY REVIEW  No new microbiology results to review.    IMAGING AND OTHER STUDIES  Telemetry notable findings: none.    MEDICATIONS  See list in EPIC    ASSESSMENT & PLAN   89 F with CHF, A. Fib, COPD, bronchial asthma, on 2L home O2, seizure disorder on keppra, BPD, Schizoaffective disorder, polysubstance use on buprenorphine maintenance, PTSD, multiple SA, active cocaine use, obesity presented with chest  pain and wasadmitted for SI, hypoK 2.6,substernal chest pressure,SOB consistent with CHF exacerbation. Iinitially on active SI precautions, now no longer on active SI precautions and does not meet criteria for inpatient psych. C/c/b hypokalemia and difficulty with safe discharge planning.    #Electrolyte Abnormalities   #Hypokalemia   #Hypochloremia   #Metabolic Alkalosis   Patient has had multiple electrolyte abnormalities since inial hospitalization. Patient has required aggressive potassium repletion in particular, requiring over 100 mEq of potassium most days. Hypokalemia and metabolic alkalosis initially thought to be due to patient's agressive diuresis plan. Though urine Cl very elevated in 50s, which would not be expected with diuresis. Patient has no signs of hyperaldosteronism, has no diarrhea or vommiting. If  diuretics are not the main cause of electrolyte imbalances, differential includes possibly Gitelman syndrome (which can appear in adulthood) as patient has muscle cramps and spasms, polyuria, metabolic alkalosis, low Cl and severe hypokalemia though Mg levels have been normal and K on chart review has been normal expect for the past month and would assume that for genetic condition that would be slow increase in K.   - After resuming Torsemid at a lower dose yesterday (20), her K is low again to 3.2. Repleting.   - Amiloride instead of her prior spironolactone  - nephrology following, appreciate recs    - likely needs chronic K supplementation at discharge   - T/b nephrology today, considering Torsemide at even lower dose 10 today and monitoring BMP  - Daily weights, I/O    #SI  #Polysubstance Use   #Housing Insecurity  Presented with active SI without plan. High risk due to multiplerecent hospitalizations 2/2 multiplesuicideattempts. Has been verbally aggressive to staff and has refused labs or medication at times. Initially on 1:1 sitter, psych was consulted and eventually patient no longer  needed a sitter and was safe from a psychiatric perspective for discharge.   - SW consulted, appreciate recs  - Psych consulted, appreciate recs:  Does not meet section 12 criteria. No psychiatric contraindication to discharge. May increase seroquel to 100 nightly.   - She appears somnolent this morning, likely due to her having just received her Ativan dose. Will continue to monitor, consider decreasing dose if this persists.       #HFpEF (EF 70%)  Patient found to be fluid overloaded on admission. On torsemide 120 mg BID, though unclear patient's compliance. Patient's volume status inpatient  has been difficult to determine due to patient's body habitus, baseline O2 requirement, and likely chronic leg edema. Strict I/Os have been ordered as well as daily standing weights but I/Os have been difficult to accurately record and patient is refusing standing weights. Patient as continued on home diuretic until AKI developed and home diuretic was  []  TI: Consider R heart cath outpatient   - torsemide as above, awaiting nephrology recs   - discontinued home metolazone 5/1  - Spirono --> amiloride changed 5/1  - Difficult volume exam    #Chest Pain - resolved   Initially presented with chest pain on admission. Trops mildly elevated but stable. EKG showed ST depressions seen on prior but no reciprocal changes. Chest pain seems to be exertional and/pr [leuritic  so differential mainly stable angina vs type 2 NSTEMI from CHF exacerbation.  - monitor on tele  []  TI: consider ischemic eval outpatient     #AKI   Creat baseline ~0.8. Developed AKI, likely iso overdiuresis. Cr today 1.0.   - CTM     #Hyponatremia  Hyponatremic to 131 on admission and continues to be hyponatremic throughou hospitalization, though levels fluctuating. Urine Osm 89 and Urine Na <20 then 37. Hyponatremia etiology likely has changed with fluctuating volume status. Likely due to low effective circulating volume due to CHF exacerbation but was later  caused by aggressive diuretics. Unclear why urine osm so low as patient did not seem to have primary polydipsia.   - CTM    #Chronic pain  #Somnolence   Pt with recurrence of burning shooting pain from hips/lower back to anterior thighs, to her shins. Has known spinal stenosis with sciatica. On many sedating medications for pain, which have been adjusted due to somnolence.   - continue clotrimazole 1% BID, diclofenac 1%  2g TID, Lidocane patch 3 qam,  - Continue methocarbamol 750 mg TID  - Continue Lorazepam 0.5 mg TID PRN   - Continue Gabapentin 500 mg BID    #Constipation  Chronic constipation. Didn't have bowel movement for about 1-2 weeks on initial presentation. On miralax at home. Senna has helped in previous hospitalizations.   - Senna and Miralax scheduled   - PRN enema     #Isolated Elevated AlkPhos   Elevated on admission. No RUQ pain. Other LFTs WNL.   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 On 2L NC at home, but was only recently started on home O2 within the past month . Supplemental O2 has been titrated iso of both baseline COPD and O2 requirement on top of COPD exacerbation. Continues to beafebrile, withouttachypnea, withoutuse of accessory muscles, andno coughing. O2   - Contine home budesonide-formoterol, albutero q6h prn, fluticasone prn  - Continue NC 2 L overnight     #Polycythemia - Resolved  Polycythemiaresolving, likely due toreturn ofintravascular volume.  -continue to monitor    #Leukocytosis - Resolved  Normalized.A febrile and hemodynamically stable.  -continue to  monitor    Telemetry: Yes    Foley: No    FEN: diabetes diet, PO    Prophylaxis: heparin 5,000 units    Medication Reconciliation: Done    Code Status  Full Code    Health care proxy: Data Unavailable      Dispo: pending appropriate titration of home diuretic dose with stable electrolytes      Vena RuaHolly Morgan Keinath, MD  PGY-1      Discussed patient case and plan with attending, Anastasia Fiedlerardoso, Renata G., MD

## 2022-01-19 NOTE — Progress Notes (Signed)
SW left voicemail for Boneta Lucks [phone] case manager at [facility] inquiring for help w/ d/c plan.     SW awaiting call back will continue to follow.

## 2022-01-19 NOTE — Plan of Care (Signed)
Oral potassium replacement. VSS on 0-2L nc, pt stating she feels SOB after activity. Pt reports visual hallucinations intermittently throughout the shift. PRN ativan given.  Problem: Activity:  Goal: Mobility will be supported throughout the hospitalization,  Outcome: Progressing     Problem: Cognitive:  Goal: Caregivers and patients knowledge of risk factors and measures for prevention of condition will be supported throughout the hospitalization  Outcome: Progressing     Problem: Safety:  Goal: Will remain free from falls throughout the hospitalization,  Outcome: Progressing     Problem: Knowledge Deficit  Goal: Patient/S.O. demonstrates understanding of disease process, treatment plan, medications, and discharge instructions.  Description: Complete learning assessment and assess knowledge base  Outcome: Progressing     Problem: Inadequate Coping  Goal: Demonstrates ability to cope effectively  Description: Patient is able to verbalize feelings related to emotional state.  Outcome: Progressing  Goal: Verbalizes adaptive coping mechanisms  Description: Able to verbalize adaptive coping mechanisms such as physical activity, distraction, and deep breathing exercises.  Outcome: Progressing     Problem: Potential for Suicide  Goal: Remain free from self harm  Description: Assess suicide risk on admission and per hospital policy.  Assess and monitor patient's mood and behaviors which may signal an increase in suicidal potential, statements such as "I wish I were dead", acting recklessly, giving away personal possessions, or suicide notes.  Assess patient for symptoms of post-traumatic stress disorder.  Implement suicide precautions per hospital policy.  Collaborate with interdisciplinary team and initiate plan and interventions as ordered.  Outcome: Progressing

## 2022-01-19 NOTE — Plan of Care (Signed)
Problem: Activity:  Goal: Mobility will be supported throughout the hospitalization,  Outcome: Progressing     Problem: Cognitive:  Goal: Caregivers and patients knowledge of risk factors and measures for prevention of condition will be supported throughout the hospitalization  Outcome: Progressing     Problem: Safety:  Goal: Will remain free from falls throughout the hospitalization,  Outcome: Progressing     Problem: Knowledge Deficit  Goal: Patient/S.O. demonstrates understanding of disease process, treatment plan, medications, and discharge instructions.  Description: Complete learning assessment and assess knowledge base  Outcome: Progressing     Problem: Inadequate Coping  Goal: Demonstrates ability to cope effectively  Description: Patient is able to verbalize feelings related to emotional state.  Outcome: Progressing  Goal: Verbalizes adaptive coping mechanisms  Description: Able to verbalize adaptive coping mechanisms such as physical activity, distraction, and deep breathing exercises.  Outcome: Progressing     Problem: Potential for Suicide  Goal: Remain free from self harm  Description: Assess suicide risk on admission and per hospital policy.  Assess and monitor patient's mood and behaviors which may signal an increase in suicidal potential, statements such as "I wish I were dead", acting recklessly, giving away personal possessions, or suicide notes.  Assess patient for symptoms of post-traumatic stress disorder.  Implement suicide precautions per hospital policy.  Collaborate with interdisciplinary team and initiate plan and interventions as ordered.  Outcome: Progressing

## 2022-01-20 DIAGNOSIS — E873 Alkalosis: Secondary | ICD-10-CM | POA: Diagnosis not present

## 2022-01-20 DIAGNOSIS — E878 Other disorders of electrolyte and fluid balance, not elsewhere classified: Secondary | ICD-10-CM | POA: Diagnosis not present

## 2022-01-20 DIAGNOSIS — E876 Hypokalemia: Secondary | ICD-10-CM | POA: Diagnosis not present

## 2022-01-20 DIAGNOSIS — R45851 Suicidal ideations: Secondary | ICD-10-CM | POA: Diagnosis not present

## 2022-01-20 LAB — CBC WITH PLATELET
ABSOLUTE NRBC COUNT: 0 10*3/uL (ref 0.0–0.0)
HEMATOCRIT: 41.1 % (ref 34.1–44.9)
HEMOGLOBIN: 13.4 g/dL (ref 11.2–15.7)
MEAN CORP HGB CONC: 32.6 g/dL (ref 31.0–37.0)
MEAN CORPUSCULAR HGB: 30.9 pg (ref 26.0–34.0)
MEAN CORPUSCULAR VOL: 94.7 fl (ref 80.0–100.0)
MEAN PLATELET VOLUME: 10.2 fL (ref 8.7–12.5)
NRBC %: 0 % (ref 0.0–0.0)
PLATELET COUNT: 379 10*3/uL (ref 150–400)
RBC DISTRIBUTION WIDTH STD DEV: 48.6 fL — ABNORMAL HIGH (ref 35.1–46.3)
RED BLOOD CELL COUNT: 4.34 M/uL (ref 3.90–5.20)
WHITE BLOOD CELL COUNT: 4.3 10*3/uL (ref 4.0–11.0)

## 2022-01-20 LAB — BASIC METABOLIC PANEL
ANION GAP: 11 mmol/L (ref 10–22)
ANION GAP: 12 mmol/L (ref 10–22)
BUN (UREA NITROGEN): 27 mg/dL — ABNORMAL HIGH (ref 7–18)
BUN (UREA NITROGEN): 26 mg/dL — ABNORMAL HIGH (ref 7–18)
CALCIUM: 9.4 mg/dL (ref 8.5–10.5)
CALCIUM: 9.4 mg/dL (ref 8.5–10.5)
CARBON DIOXIDE: 30 mmol/L (ref 21–32)
CARBON DIOXIDE: 28 mmol/L (ref 21–32)
CHLORIDE: 94 mmol/L — ABNORMAL LOW (ref 98–107)
CHLORIDE: 95 mmol/L — ABNORMAL LOW (ref 98–107)
CREATININE: 0.8 mg/dL (ref 0.4–1.2)
CREATININE: 0.9 mg/dL (ref 0.4–1.2)
ESTIMATED GLOMERULAR FILT RATE: >60 mL/min (ref >60)
ESTIMATED GLOMERULAR FILT RATE: >60 mL/min (ref >60)
Glucose Random: 117 mg/dL (ref 74–160)
Glucose Random: 138 mg/dL (ref 74–160)
POTASSIUM: 3.9 mmol/L (ref 3.5–5.1)
POTASSIUM: 3.6 mmol/L (ref 3.5–5.1)
SODIUM: 134 mmol/L — ABNORMAL LOW (ref 136–145)
SODIUM: 135 mmol/L — ABNORMAL LOW (ref 136–145)

## 2022-01-20 LAB — PHOSPHORUS MAGNESIUM
MAGNESIUM: 2.2 mg/dL (ref 1.6–2.6)
PHOSPHORUS: 3.1 mg/dL (ref 2.5–4.9)

## 2022-01-20 MED ORDER — TORSEMIDE 10 MG PO TABS
10.0000 mg | ORAL_TABLET | Freq: Every day | ORAL | Status: DC
Start: 2022-01-20 — End: 2022-01-23
  Administered 2022-01-20 – 2022-01-23 (×4): 10 mg via ORAL
  Filled 2022-01-20 (×4): qty 1

## 2022-01-20 NOTE — Plan of Care (Signed)
PRN ativan for anxiety. Emotional support provided during visual hallucinations. Pt feels SOB when walking, but O2 sats stay above 90% when spot checked.  Problem: Activity:  Goal: Mobility will be supported throughout the hospitalization,  Outcome: Progressing     Problem: Cognitive:  Goal: Caregivers and patients knowledge of risk factors and measures for prevention of condition will be supported throughout the hospitalization  Outcome: Progressing     Problem: Safety:  Goal: Will remain free from falls throughout the hospitalization,  Outcome: Progressing     Problem: Knowledge Deficit  Goal: Patient/S.O. demonstrates understanding of disease process, treatment plan, medications, and discharge instructions.  Description: Complete learning assessment and assess knowledge base  Outcome: Progressing     Problem: Inadequate Coping  Goal: Demonstrates ability to cope effectively  Description: Patient is able to verbalize feelings related to emotional state.  Outcome: Progressing  Goal: Verbalizes adaptive coping mechanisms  Description: Able to verbalize adaptive coping mechanisms such as physical activity, distraction, and deep breathing exercises.  Outcome: Progressing     Problem: Potential for Suicide  Goal: Remain free from self harm  Description: Assess suicide risk on admission and per hospital policy.  Assess and monitor patient's mood and behaviors which may signal an increase in suicidal potential, statements such as "I wish I were dead", acting recklessly, giving away personal possessions, or suicide notes.  Assess patient for symptoms of post-traumatic stress disorder.  Implement suicide precautions per hospital policy.  Collaborate with interdisciplinary team and initiate plan and interventions as ordered.  Outcome: Progressing

## 2022-01-20 NOTE — Progress Notes (Addendum)
CASE MANAGER REASSESSMENT NOTE      Case Reviewed with: MDR attendants     Level of Care:  IP      Next review date if insurance: daily    Change in status from initial assessment:   N/A    New information that will affect transition plan: Pt remains on O2 @ 2LPM mostly at night, unable to wean and presents a barrier for placement to medical respite places.    Anticipated Discharge plan: Discussed during MDR/high risk huddle mtg, pt remains medically cleared for discharge pending safe dispo. GOC meeting w/ pt, IDT, [facility] services held today to discuss dispo. Pt is amenable to work with PT and send referrals to STR while looking for housing placement. Reached out to [facility] RN, left message awaiting call back. Variance days updated.      Per patient choice, referrals made to:     [facility]- referred, pending  [facility]- referred, pending
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
## 2022-01-20 NOTE — Progress Notes (Signed)
SW called the following rest homes on behalf of pt.     [facility], Malachi Carl [phone]   - SW attempted to leave message for the owner Tonna Corner [phone] but the voicemail box was not set up yet.     [place], [place] [phone]   - SW left message  inquiring on bed availability     [facility], [place], [phone]   - SW left message for Noreene Larsson the Interior and spatial designer inquiring on bed availability     [facility] [phone]  - SW left message for Daine Floras inquiring on bed availability     [facility], Ethelene Browns [phone]  - SW spoke w/ director. Director reports bed available w/ shared bathroom but home is private pay only and is $6000 a month.     [address] [facility], [place] [phone]  - SW called and spoke w/ intake coordinator who reports pt is denied due to O2 concerns.     [facility], E. Gunnar Bulla [phone]  - SW called and spoke w/ intake there are no beds available.     [facility] [place], [phone]  - SW called and left message w/ inquiring on bed availability     [place], [place] [phone]  - SW called and spoke w/ intake there are no female beds available at this time.     [facility] Manus Rudd [phone]  - SW spoke w/ the operator who confirms [facility] is private pay but did not specify how much it is. Operator transferred SW to admissions but SW was unable to leave message.     [facility] [phone]  - SW left message for [place] inquiring on bed availability.    [facility], [place] [phone]  - SW left message for executive director inquiring on bed availability    [facility] Renae Fick[facility], [place] [phone]  - SW spoke w/ Sister who confirms that [facility] is a Producer, television/film/video and only offers respite beds to [employer].     [facility], [place] [phone]  - SW spoke w/ Gray who confirms that a bed is available however it is located on the second floor. Andrey Campanile says pt will need to be able to go up/down 12-15  steps. Andrey Campanile says the home takes private pay, SSI, DTA etc. In order to see if pt is eligible Andrey Campanile would need to visit pt in hospital. [place] plans to call SW again on timeline.     [address], [place] [phone]   - SW called and attempted to leave message but was unable to.     [facility] [phone]  - SW spoke w/ admissions who confirms facility is on an admission freeze.     [name], [place] [phone]  - SW spoke w/ admissions who states "I would have to deny her as she is not a good choice for our facility."     [facility], Rema Jasmine [phone]  - SW spoke w/ director who confirms that they do not take anyone 51 years old and need to be elderly and have skilled nursing needs. Not a respite facility.    [facility], [place] [phone]  - SW spoke w/ intake coordinator. There are no female beds available.     [facility], Darrold Span [phone]  - SW left message for reception who will have executive director call SW back.   - SW received call from admissions who confirms that a studio apartment is available  w/ a private bathroom. It will be $350 a day. However all residents must be 65+.     [facility], [phone]  - SW spoke w/ [facility] who confirms a bed is available but states that anyone that is accepted into the home must be sober for a minimum of a year.        -----------------------------------------  SW spoke w/ [employer] admissions. Pt is denied due to medical condition at this time. They believe that pt will not be able to participate in the program to the magnitude which is required.      SW spoke w/ Hospital doctor at [place]. Team meeting w/ inpatient medical team and pt's outpatient team scheduled for 3:00 today.

## 2022-01-20 NOTE — Consults (Signed)
NEPHROLOGY CONSULT  NOTE    DATE: 01/20/2022   ADMISSION DATE & TIME: 01/11/22      Pt summary  This is a 51 year old female with CHF, A fib, COPD (now 2L NC at home), obesity, complex psychiatric hx with multiple hospitalizations for SA p/w chest pain and SOB. Admitted for CHF exacerbation; nephrology consulted for persistent hypok despite aggressive PO repletion, formulated to be 2/2 overdiuresis with recent med changes in April psych hospitalizations perpetuated by hypovolemia induced metabolic alkalosis    For details pls see initial consult note on 5/1    INTERVAL HISTORY    NAEON  K 3.9, yesterday total 120 mEq K 3.2 AM, 80 meq> 3.7 at 1533, another 40 meq at 2007 > 309 730am today    Torsemide 10 mg 1451, UOP 850 cc rest of night, total day 1.6L/ in 1.7L = +60 cc    today given tors 2x 817 and 1140AM, per team was not intentional, UOP 900 ccs since    Remains on amiloride 5      SUBJECTIVE:    - similar, chronic lower extremity main concern.   Trying to drink less, now 1L pitcher water in gray pitcher, 3-4 sodas (total 2-3L)      PHYSICAL EXAM:  BP 96/65   Pulse 91   Temp 97.5 F (36.4 C) (Oral)   Resp 20   Ht 5\' 9"  (1.753 m)   Wt (!) 152.7 kg (336 lb 11.2 oz)   SpO2 91%   BMI 49.72 kg/m   General: NAD, alert and interactive lying in bed comfortably   HEENT: MMM, nonicterictic sclera  CV: Regular RR  Resp: Normal WOB, CTAB. 2L NC speaking in full sentences  GI: obese soft abdomen  Ext: Warm, trace nonpitting edema on bilateral shins  Neuro: alert and orienteded to person situation place CN II-XII grossly intact, moving all extremities  Psych: Affect and speech appropriate to situation      HOME MEDICATIONS:  Prior to Admission Medications   Prescriptions Last Dose Informant Patient Reported? Taking?   FLUoxetine (PROZAC) 20 MG capsule   No No   Sig: Take 1 capsule by mouth in the morning.   Loratadine 10 MG CAPS   No No   Sig: Take 10 mg by mouth in the morning.   OTHER MEDICATION   No No   Sig:  Wheelchair: Electric  For use as directed.    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 Begin with 2 LPM and monitor oxygen saturation using a pulse oximeter.  2. If oxygen saturation is below 90%, increase the flow rate by 0.5 LPM every 15-30 minutes until the desired saturation level of 90%-95% is achieved.  3. If oxygen saturation is above 95%, decrease the flow rate by 0.5 LPM every 15-30 minutes until the desired saturation level of 90%-95% is achieved.  4. Once the desired saturation level is achieved, maintain the flow rate at that level.    DX: COPD with Hypoxia, Requires continuous supplemental oxygen  ICD10: Code: J44.9 and Z99.81    SIGNED Georgianne Fick, CNP, 01/05/2022    Nurse Practitioner   Hospitalist Division  [facility]  Units [place] 2 and [place] 2  Phone:[phone] (fastest) or [phone]   Email:[email or web address]     NPI: [id number]   OXcarbazepine (TRILEPTAL) 150 MG tablet   No No   Sig: Take 1 tablet by mouth in the morning and 1 tablet before bedtime.   QUEtiapine (SEROQUEL) 50 MG tablet   No No   Sig: Take 1 tablet by mouth at bedtime   Torsemide 60 MG TABS   No No   Sig: Take 120 mg by mouth in the morning and 120 mg in the evening.   albuterol HFA 108 (90 Base) MCG/ACT inhaler   No No   Sig: Inhale 2 puffs into the lungs every 6 (six) hours as needed for Wheezing   aspirin 81 MG chewable tablet   No No   Sig: Take 1 tablet by mouth in the morning.   atorvastatin (LIPITOR) 80 MG tablet   No No   Sig: Take 1 tablet by mouth in the morning.   azithromycin (ZITHROMAX) 500 MG tablet   No No   Sig: Take 1 tablet by mouth 3 (three) times a week   budesonide-formoterol (SYMBICORT) 160-4.5 MCG/ACT inhaler   No No   Sig: Inhale 2 puffs into the lungs in the morning and 2 puffs before bedtime.    buprenorphine-naloxone (SUBOXONE) 8-2 MG sublingual tablet   No No   Sig: Place 1 tablet under the tongue in the morning and 1 tablet at noon and 1 tablet before bedtime. Max Daily Amount: 3 tablets. Do all this for 7 days.   clotrimazole (LOTRIMIN AF) 1 % cream   No No   Sig: Apply topically 2 (two) times daily   diclofenac (VOLTAREN) 1 % GEL Gel   No No   Sig: Apply 2 g topically 3 (three) times daily as needed (knee pain)   fluticasone (FLONASE) 50 MCG/ACT nasal spray   No No   Sig: 1 spray by Each Nostril route 2 (two) times daily as needed (congestion)   gabapentin (NEURONTIN) 300 MG capsule   No No   Sig: Take 2 capsules by mouth in the morning and 2 capsules at noon and 2 capsules before bedtime.   levETIRAcetam (KEPPRA) 750 MG tablet   No No   Sig: Take 1 tablet by mouth in the morning and 1 tablet before bedtime.   levETIRAcetam (KEPPRA) 750 MG tablet   No No   Sig: Take 1 tablet by mouth in the morning and 1 tablet before bedtime.   levothyroxine (SYNTHROID) 25 MCG tablet   No No   Sig: Take 1 tablet by mouth every morning before breakfast   lidocaine (SALONPAS) 4 % PTCH   No No   Sig: Apply 1 patch topically in the morning.   loxapine (LOXITANE) 25 MG capsule   No No   Sig: Take 1 capsule by mouth in the morning and 1 capsule before bedtime.   methocarbamol (ROBAXIN) 750 MG tablet   No No   Sig: Take 1 tablet by mouth in the morning and 1 tablet at noon and 1 tablet before bedtime.   metolazone (ZAROXOLYN) 5 MG tablet   No No   Sig: Take 1 tablet by mouth in the morning.   nicotine (NICODERM CQ) 21 MG/24HR   No No   Sig: Place 1 patch onto the skin in the morning.   nicotine polacrilex (NICORETTE) 4 MG gum   No No   Sig: Take 1 each by mouth every 2 (two) hours as needed for [place] (Nicotine)  nicotine polacrilex (NICORETTE) 4 MG gum   No No   Sig: Take 1 each by mouth every 2 (two) hours as needed for Craving (Nicotine)   pantoprazole (PROTONIX) 40 MG tablet   No No   Sig: Take 1 tablet by mouth in  the morning.   prazosin (MINIPRESS) 1 MG capsule   No No   Sig: Take 3 capsules by mouth nightly   torsemide 60 MG TABS   No No   Sig: Take 120 mg by mouth in the morning and 120 mg at noon and 120 mg before bedtime.      Facility-Administered Medications: None        HOSPITAL MEDICATIONS:  Scheduled Meds:   torsemide  10 mg Oral Daily    QUEtiapine  100 mg Oral Nightly    aMILoride  5 mg Oral Daily    acetaminophen  1,000 mg Oral Q6H SCH    nystatin   Topical BID    lidocaine  3 patch Topical Q24H    gabapentin  500 mg Oral TID    methocarbamol  500 mg Oral TID    polyethylene glycol  17 g Oral Daily    sennosides  17.2 mg Oral Nightly    heparin (porcine)  5,000 Units Subcutaneous Q12H [facility]    aspirin  81 mg Oral Daily    atorvastatin  80 mg Oral Daily    azithromycin  500 mg Oral Once per day on Mon Wed Fri    budesonide-formoterol  2 puff Inhalation BID    buprenorphine-naloxone  8 mg Sublingual TID    clotrimazole   Topical BID    FLUoxetine  20 mg Oral Daily    levETIRAcetam  750 mg Oral BID    levothyroxine  25 mcg Oral DAILY    loratadine  10 mg Oral Daily    loxapine  25 mg Oral BID    nicotine  1 patch Transdermal Daily    OXcarbazepine  150 mg Oral BID    pantoprazole  40 mg Oral Daily    prazosin  3 mg Oral Nightly       PRN Meds:  sennosides, LORazepam, albuterol HFA, diclofenac, nicotine polacrilex        RECENT DATA:  Recent labs, imaging, and EKGs personally reviewed. Relevant findings noted here and/or discussed in A&P below.    Labs:    Chemistries:  Recent Labs     01/17/22  1718 01/18/22  0735 01/18/22  1440 01/19/22  0712 01/19/22  1533 01/20/22  0730   NA 132* 133* 131* 133* 133* 134*   K 2.8* 3.9 3.6 3.2* 3.7 3.9   CO2 32 29 31 33* 31 30   BUN 30* 32* 26* 29* 27* 27*   CREAT 1.0 1.1 0.9 1.0 0.9 0.8   CA 9.4 9.7 9.7 9.5 10.0 9.4   MG 1.9 2.1 2.2 2.1 2.1 2.2   PHOS 3.4 3.3 3.5 3.5 3.3 3.1   ANION 14 14 11 10 12 11    GFR > 60 > 60 > 60 > 60 > 60 > 60    GI:  No results  for input(s): AST, ALT, TBILI, DBILI, IBIL, ALKPHOS, GGTP, ALBUMIN, LIP in the last 72 hours.   CBC:  Recent Labs     01/18/22  0735 01/19/22  0712 01/20/22  0730   WBC 5.0 4.3 4.3   HGB 13.9 13.6 13.4   HCT 43.3 41.4 41.1   PLTA 394 400 379  Coags:   No results for input(s): INR, PT, APTT, HPTT, FIB in the last 72 hours.   Trops, BNP, D-dimer, Lactate, C-RP:  No results for input(s): TROPI, PROBNP, DDPEDVT, DD, LACTICACID, CRP in the last 72 hours.    Finger Sticks:  No results for input(s): FINGERSTICKR in the last 72 hours. Urinalysis:    Recent Labs     01/18/22  1657   UACOL YELLOW   UACLA SL CLOUDY*   UAGLU NEGATIVE   UABIL NEGATIVE   UAKET NEGATIVE   SPEGRAVURINE 1.010   UAOCC NEGATIVE   UAPH 7.0   UAPRO NEGATIVE   UANIT NEGATIVE   LEUKOCYTES NEGATIVE            ASSESSMENT & PLAN:  51 year old female with CHF, A fib, COPD, complex psychiatric hx with multiple hospitalizations for SA p/w chest pain and SOB. Admitted for CHF exacerbation(4/25-)and nephrology was consulted for persistent hypokalemia.        Problems:        #Hypokalemiain the setting ofhypovolemia/perpetuated bymetabolic alkalosis  #metabolic alkalosis   #HFpEF  - still working on finding right balance of K supplementation needs  - dc 5 mg of amiloride; K correcting    - she likely was initally total body Potassium depleted from overdiuresis PTA and over the course of admission   - for mtn diuretic dosing: agree w 10 mg torsemide daily w close monitoring of uop  Goal would be 0 to -500 cc/s net for the day  - goal K is normal range, will need to see K repletion needs while on torsemide 10, likely will be dc'd on it    #hyponatremia, hypo or euvolemic likely hypo-osmotic  Initially likely hypovolemic from for diuresis, home maintained LE through 2/ 2.2 polydipsia vs SIADH  Improving w dec PO fluid intake         #AKI, resolved  Had AKI 4/27 HD2 iso aggressive diuresis now back to baseline            Diet: Diet - Base Diet:  Regular  Code: Full Code  Contact/HCP: Data Unavailable Data Unavailable  PCP: [name], MD        Elyse Hsu, MD, 01/20/2022  Nephrology consult  PGY3 [phone]  Discussed with Dr Marisa Sprinkles

## 2022-01-20 NOTE — Plan of Care (Signed)
A&Ox4. VSS. 2L NC, baseline. Ambulates independently. Strict I&O, hat in bathroom. Meds whole with liquids. PRN ativan given for anxiety. K+ given PO for supplementation. Nystatin powder applied to folds. Pain in bilateral knees, voltaren gel applied and scheduled tylenol given. No endorsement of hallucinations overnight. Resting comfortably. Call light within reach. Safety maintained.    Problem: Activity:  Goal: Mobility will be supported throughout the hospitalization,  Outcome: Progressing     Problem: Cognitive:  Goal: Caregivers and patients knowledge of risk factors and measures for prevention of condition will be supported throughout the hospitalization  Outcome: Progressing     Problem: Safety:  Goal: Will remain free from falls throughout the hospitalization,  Outcome: Progressing     Problem: Knowledge Deficit  Goal: Patient/S.O. demonstrates understanding of disease process, treatment plan, medications, and discharge instructions.  Description: Complete learning assessment and assess knowledge base  Outcome: Progressing     Problem: Inadequate Coping  Goal: Demonstrates ability to cope effectively  Description: Patient is able to verbalize feelings related to emotional state.  Outcome: Progressing  Goal: Verbalizes adaptive coping mechanisms  Description: Able to verbalize adaptive coping mechanisms such as physical activity, distraction, and deep breathing exercises.  Outcome: Progressing     Problem: Potential for Suicide  Goal: Remain free from self harm  Description: Assess suicide risk on admission and per hospital policy.  Assess and monitor patient's mood and behaviors which may signal an increase in suicidal potential, statements such as "I wish I were dead", acting recklessly, giving away personal possessions, or suicide notes.  Assess patient for symptoms of post-traumatic stress disorder.  Implement suicide precautions per hospital policy.  Collaborate with interdisciplinary team and initiate  plan and interventions as ordered.  Outcome: Progressing

## 2022-01-20 NOTE — RN Shift Note (Signed)
A&Ox4. Calm, cooperative, anxious. VSS. 2L NC, patient baseline, at rest 91% on RA. Ambulates independently, no assistive device. Meds whole with liquids. PRN ativan given before bed. C/o pain bilateral legs, voltaren gel applied. Declined heparin SC, ambulates frequently. No endorsement of hallucinations overnight, however does state she was having them frequently during the day. Resting comfortably in bed. Call light within reach. Safety maintained.

## 2022-01-20 NOTE — Progress Notes (Signed)
Nutrition Progress Note        Nutrition related labs: Sodium (134), chloride (94), BUN (27)  Fluid status: No edema noted  BMs: Last BM 5/4  Documented PO intake: 65, 100     Met with pt at bedside. No new nutrition related questions, appreciative of edu by RD intern. Pt discussed during MDR, waiting on bed and eval if pt can complete 12-15 stairs.     Appears to be eating well per RN documentation. No documented need for ONS at this time. Nutrition will continue to follow.       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     (HFpEF) heart failure with preserved ejection fraction ([name])     MDD (major depressive disorder), recurrent, severe, with psychosis ([place])     Requires continuous at home supplemental oxygen     Spinal stenosis of lumbar 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 claudication     Fall in home     Gait instability     Hypokalemia    Past Medical History:  schizo: Bipolar affective ([place])  No date: Chronic systolic CHF (congestive heart failure) ([place])  No date: Depression  12/23/2021: Fall in home  12/23/2021: Gait instability  12/23/2021: Requires continuous at home supplemental oxygen  depress: Schizo affective schizophrenia ([place])  12/23/2021: Spinal stenosis of lumbar 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   claudication  No date: Substance abuse ([name])                                                                            Anthropometrics  Height: $Remove'5\' 9"'[id number]$  (175.3 cm)  Weight: (!) 152.7 kg (336 lb 11.2  oz)  BMI (Calculated): 46.64  IBW/kg (Calculated): 66.2        Most Recent Weight Reading(s)  01/20/22 : (!) 152.7 kg (336 lb 11.2 oz)  01/01/22 : (!) 152.1 kg (335 lb 6.4 oz)  12/21/21 : (!) 159.9 kg (352 lb 8 oz)  12/13/21 : (!) 165.8 kg (365 lb 9.6 oz)  12/02/21 : (!) 145.2 kg (320 lb)  12/02/21 : (!) 150.1 kg (331 lb)  ]  Current Diet:   Orders Placed This Encounter      Diet - Base Diet: Regular              Skin Integrity: Scars;Intact;Redness  Skin Location: redness under breast, scars bilateral arms      Recent labs:  Lab Results   Component Value Date    CA 9.4 01/20/2022  NA 134 (L) 01/20/2022    K 3.9 01/20/2022    CL 94 (L) 01/20/2022    MG 2.2 01/20/2022    CO2 30 01/20/2022    PHOS 3.1 01/20/2022    BUN 27 (H) 01/20/2022    CREAT 0.8 01/20/2022    GLUCOSER 117 01/20/2022     Lab Results   Component Value Date    WBC 4.3 01/20/2022    HGB 13.4 01/20/2022    HCT 41.1 01/20/2022    PLTA 379 01/20/2022    RBC 4.34 01/20/2022    ALBUMIN 3.9 01/13/2022       Scheduled Medications:  ? torsemide  10 mg Oral Daily   ? QUEtiapine  100 mg Oral Nightly   ? aMILoride  5 mg Oral Daily   ? acetaminophen  1,000 mg Oral Q6H [place]   ? nystatin   Topical BID   ? lidocaine  3 patch Topical Q24H   ? gabapentin  500 mg Oral TID   ? methocarbamol  500 mg Oral TID   ? polyethylene glycol  17 g Oral Daily   ? sennosides  17.2 mg Oral Nightly   ? heparin (porcine)  5,000 Units Subcutaneous Q12H [place]   ? aspirin  81 mg Oral Daily   ? atorvastatin  80 mg Oral Daily   ? azithromycin  500 mg Oral Once per day on Mon Wed Fri   ? budesonide-formoterol  2 puff Inhalation BID   ? buprenorphine-naloxone  8 mg Sublingual TID   ? clotrimazole   Topical BID   ? FLUoxetine  20 mg Oral Daily   ? levETIRAcetam  750 mg Oral BID   ? levothyroxine  25 mcg Oral DAILY   ? loratadine  10 mg Oral Daily   ? loxapine  25 mg Oral BID   ? nicotine  1 patch Transdermal Daily   ? OXcarbazepine  150 mg Oral BID   ? pantoprazole  40 mg Oral Daily   ?  prazosin  3 mg Oral Nightly     PRN Medications: sennosides, LORazepam, albuterol HFA, diclofenac, nicotine polacrilex    Estimated Needs  Kcals:  (1478-2956 kcal)  Protein (g):  (93.7-119 g)  Fluid (ml):  (1.4-1.9 L/day)  Needs based on: Kcal/kg ;Gm/kg;mL/kg (25-30 kcal/kg, 1.1-1.4 g/kg, 1.4-1.9 L/d using ABW 85.2 kg)    Nutrition Diagnosis           Nutrition Risk Level Initial: Moderate risk Follow Up: Low risk   Follow Up Date Nutrition Follow Up Date: 01/28/22 (Eating well, ongoing discussions re d/c. CTM)    [name] (Problem Etiology Signs/ Symptoms) Statement:  Decreased nutrient needs (sodium) related to cardiac dysfunction as evidenced by CHF, ongoing diuresis, and wt loss 6% x 10 days due to fluid shifts.   ? -continues  ?  Interventions:??????????????????????????????????????  1. Recommend/Continue regular diet.  2. Provided CHF nutrition therapy education.   3. Check lytes (Na, K, Mg) -- replete PRN   4. Obtain regular weights to trend.   ?  Monitoring and evaluation:  ? PO intake  ? Weights  ? Volume status  ? Response to diet education provided  ? Lytes  ? Labs as ordered  ?  Goals:  ? Pt to consume >75% most meals  ? Lytes wnl  ? Fluid balance  ? Pt understanding of diet education provided      Name Bea Laura RD,LDN     Date 01/20/2022    Time 12:57 PM  Pager [phone]

## 2022-01-20 NOTE — Progress Notes (Signed)
Pt discussed during MDR.     SW, RN case Freight forwarder, Community education officer, psych resident, medical resident, Dietitian, Vikki Ports ([facility] case worker) and Probation officer at [facility] met to discuss pt's d/c plan.     Many d/c options were discussed during the meeting including: CSS beds, CSU beds, dual diagnosis facility, rest homes, respite programs, and past group home etc. Team discussed looking into STR for pt. Pt agrees w/ plan. RN case manager will start to send referrals.     SW provided pt clothing as pt requested.     SW will continue to be available

## 2022-01-20 NOTE — Plan of Care (Signed)
Problem: Activity:  Goal: Mobility will be supported throughout the hospitalization,  Outcome: Progressing     Problem: Cognitive:  Goal: Caregivers and patients knowledge of risk factors and measures for prevention of condition will be supported throughout the hospitalization  Outcome: Progressing     Problem: Safety:  Goal: Will remain free from falls throughout the hospitalization,  Outcome: Progressing     Problem: Knowledge Deficit  Goal: Patient/S.O. demonstrates understanding of disease process, treatment plan, medications, and discharge instructions.  Description: Complete learning assessment and assess knowledge base  Outcome: Progressing     Problem: Inadequate Coping  Goal: Demonstrates ability to cope effectively  Description: Patient is able to verbalize feelings related to emotional state.  Outcome: Progressing  Goal: Verbalizes adaptive coping mechanisms  Description: Able to verbalize adaptive coping mechanisms such as physical activity, distraction, and deep breathing exercises.  Outcome: Progressing     Problem: Potential for Suicide  Goal: Remain free from self harm  Description: Assess suicide risk on admission and per hospital policy.  Assess and monitor patient's mood and behaviors which may signal an increase in suicidal potential, statements such as "I wish I were dead", acting recklessly, giving away personal possessions, or suicide notes.  Assess patient for symptoms of post-traumatic stress disorder.  Implement suicide precautions per hospital policy.  Collaborate with interdisciplinary team and initiate plan and interventions as ordered.  Outcome: Progressing

## 2022-01-21 DIAGNOSIS — E873 Alkalosis: Secondary | ICD-10-CM | POA: Diagnosis not present

## 2022-01-21 DIAGNOSIS — R45851 Suicidal ideations: Secondary | ICD-10-CM | POA: Diagnosis not present

## 2022-01-21 DIAGNOSIS — E876 Hypokalemia: Secondary | ICD-10-CM | POA: Diagnosis not present

## 2022-01-21 DIAGNOSIS — E785 Hyperlipidemia, unspecified: Secondary | ICD-10-CM

## 2022-01-21 DIAGNOSIS — G40909 Epilepsy, unspecified, not intractable, without status epilepticus: Secondary | ICD-10-CM

## 2022-01-21 DIAGNOSIS — E878 Other disorders of electrolyte and fluid balance, not elsewhere classified: Secondary | ICD-10-CM | POA: Diagnosis not present

## 2022-01-21 DIAGNOSIS — I5030 Unspecified diastolic (congestive) heart failure: Secondary | ICD-10-CM

## 2022-01-21 DIAGNOSIS — I2510 Atherosclerotic heart disease of native coronary artery without angina pectoris: Secondary | ICD-10-CM

## 2022-01-21 LAB — CBC WITH PLATELET
ABSOLUTE NRBC COUNT: 0 10*3/uL (ref 0.0–0.0)
HEMATOCRIT: 40.7 % (ref 34.1–44.9)
HEMOGLOBIN: 13 g/dL (ref 11.2–15.7)
MEAN CORP HGB CONC: 31.9 g/dL (ref 31.0–37.0)
MEAN CORPUSCULAR HGB: 30.7 pg (ref 26.0–34.0)
MEAN CORPUSCULAR VOL: 96 fl (ref 80.0–100.0)
MEAN PLATELET VOLUME: 10.4 fL (ref 8.7–12.5)
NRBC %: 0 % (ref 0.0–0.0)
PLATELET COUNT: 382 10*3/uL (ref 150–400)
RBC DISTRIBUTION WIDTH STD DEV: 50.1 fL — ABNORMAL HIGH (ref 35.1–46.3)
RED BLOOD CELL COUNT: 4.24 M/uL (ref 3.90–5.20)
WHITE BLOOD CELL COUNT: 5.8 10*3/uL (ref 4.0–11.0)

## 2022-01-21 LAB — BASIC METABOLIC PANEL
ANION GAP: 10 mmol/L (ref 10–22)
ANION GAP: 13 mmol/L (ref 10–22)
BUN (UREA NITROGEN): 22 mg/dL — ABNORMAL HIGH (ref 7–18)
BUN (UREA NITROGEN): 22 mg/dL — ABNORMAL HIGH (ref 7–18)
CALCIUM: 9.3 mg/dL (ref 8.5–10.5)
CALCIUM: 9.8 mg/dL (ref 8.5–10.5)
CARBON DIOXIDE: 30 mmol/L (ref 21–32)
CARBON DIOXIDE: 28 mmol/L (ref 21–32)
CHLORIDE: 96 mmol/L — ABNORMAL LOW (ref 98–107)
CHLORIDE: 96 mmol/L — ABNORMAL LOW (ref 98–107)
CREATININE: 0.8 mg/dL (ref 0.4–1.2)
CREATININE: 0.9 mg/dL (ref 0.4–1.2)
ESTIMATED GLOMERULAR FILT RATE: >60 mL/min (ref >60)
ESTIMATED GLOMERULAR FILT RATE: >60 mL/min (ref >60)
Glucose Random: 132 mg/dL (ref 74–160)
Glucose Random: 107 mg/dL (ref 74–160)
POTASSIUM: 3.8 mmol/L (ref 3.5–5.1)
POTASSIUM: 4.1 mmol/L (ref 3.5–5.1)
SODIUM: 136 mmol/L (ref 136–145)
SODIUM: 136 mmol/L (ref 136–145)

## 2022-01-21 LAB — PHOSPHORUS MAGNESIUM
MAGNESIUM: 2.2 mg/dL (ref 1.6–2.6)
MAGNESIUM: 2.1 mg/dL (ref 1.6–2.6)
PHOSPHORUS: 3.8 mg/dL (ref 2.5–4.9)
PHOSPHORUS: 3.7 mg/dL (ref 2.5–4.9)

## 2022-01-21 LAB — HOLD PURPLE TOP TUBE

## 2022-01-21 NOTE — Progress Notes (Signed)
PROGRESS NOTE  01/21/2022    PATIENT INFO: Cheryl Zimmerman, [age] year old female  DATE OF ADMISSION: 01/11/22    ROOM/BED LOCATION:  423/423-A  HOSPITAL DAY: 10    REVIEW OF OVERNIGHT EVENTS:  NAONE    SUBJECTIVE:  Engaged in working with PT today.    OBJECTIVE:  BP: (96-127)/(65-88)   Temp:  [97.5 F (36.4 C)-99.1 F (37.3 C)]   Pulse:  [84-92]   Resp:  [18-20]   SpO2:  [91 %-98 %]     INS/OUTS (PAST 24 HOURS)  I/O 24 Hrs:  In: 480 [P.O.:480]  Out: 700 [Urine:700]    PHYSICAL EXAM   GEN:   Laying in bed asleep. Wakes up briefly to answer questions.   HEENT:    Head atraumatic. EOMI.   CV:      RRR  PULM:      Difficult to appreciate lung sounds. No obvious crackles or wheezing. No accessory mm use. RA.   ABD:      Obese, NTND  EXT:      +1-2 pitting LE edema - stable, no deformities   NEURO:     No focal deficits appreciated     RECENT LABS  BMP:   Recent Labs     01/18/22  0735 01/18/22  1440 01/19/22  0712 01/19/22  1533 01/20/22  0730 01/20/22  1448   NA 133* 131* 133* 133* 134* 135*   K 3.9 3.6 3.2* 3.7 3.9 3.6   CL 90* 89* 90* 90* 94* 95*   CO2 29 31 33* 31 30 28    BUN 32* 26* 29* 27* 27* 26*   CREAT 1.1 0.9 1.0 0.9 0.8 0.9   GLUCOSER 128 161* 111 128 117 138     Ca, Mg, Phos:   Recent Labs     01/18/22  0735 01/18/22  1440 01/19/22  0712 01/19/22  1533 01/20/22  0730 01/20/22  1448   CA 9.7 9.7 9.5 10.0 9.4 9.4   MG 2.1 2.2 2.1 2.1 2.2  --    PHOS 3.3 3.5 3.5 3.3 3.1  --      CBC:   Recent Labs     01/18/22  0735 01/19/22  0712 01/20/22  0730   WBC 5.0 4.3 4.3   HCT 43.3 41.4 41.1   HGB 13.9 13.6 13.4   PLTA 394 400 379   RBC 4.55 4.41 4.34       MICROBIOLOGY REVIEW  No new microbiology results to review.    IMAGING AND OTHER STUDIES  Telemetry notable findings: none.    MEDICATIONS  See list in EPIC    ASSESSMENT & PLAN   51 F with CHF, A. Fib, COPD, bronchial asthma, on 2L home O2, seizure disorder on keppra, BPD, Schizoaffective disorder, polysubstance use on buprenorphine maintenance, PTSD,  multiple SA, active cocaine use, obesity presented with chest pain and wasadmitted for SI, hypoK 2.6,substernal chest pressure,SOB consistent with CHF exacerbation. Iinitially on active SI precautions, now no longer on active SI precautions and does not meet criteria for inpatient psych. C/c/b hypokalemia and difficulty with safe discharge planning.    #Electrolyte Abnormalities   #Hypokalemia   #Hypochloremia   #Metabolic Alkalosis   Patient has had multiple electrolyte abnormalities since inial hospitalization. Patient has required aggressive potassium repletion in particular, requiring over 100 mEq of potassium most days. Hypokalemia and metabolic alkalosis initially thought to be due to patient's agressive diuresis plan. Though urine Cl very elevated in 50s,  which would not be expected with diuresis. Patient has no signs of hyperaldosteronism, has no diarrhea or vommiting. If diuretics are not the main cause of electrolyte imbalances, differential includes possibly Gitelman syndrome (which can appear in adulthood) as patient has muscle cramps and spasms, polyuria, metabolic alkalosis, low Cl and severe hypokalemia though Mg levels have been normal and K on chart review has been normal expect for the past month and would assume that for genetic condition that would be slow increase in K. Her Torsemide was held while repleting K, and resumed at 10mg  daily with BID BMP checks.  - Torsemide 10mg  daily   - D/C amiloride   - D/C spironolactone  - F/U afternoon BMP  - Nephrology following, see note  - Daily weights, I/O    #SI  #Polysubstance Use   #Housing Insecurity  Presented with active SI without plan. High risk due to multiplerecent hospitalizations 2/2 multiplesuicideattempts. Has been verbally aggressive to staff and has refused labs or medication at times. Initially on 1:1 sitter, psych was consulted and eventually patient no longer needed a sitter and was safe from a psychiatric perspective for  discharge.   - SW consulted, appreciate recs  - Psych consulted, appreciate recs:  Does not meet section 12 criteria. No psychiatric contraindication to discharge. May increase seroquel to 100 nightly.       #HFpEF (EF 70%)  Patient found to be fluid overloaded on admission. On torsemide 120 mg BID, though unclear patient's compliance. Patient's volume status inpatient  has been difficult to determine due to patient's body habitus, baseline O2 requirement, and likely chronic leg edema. Strict I/Os have been ordered as well as daily standing weights but I/Os have been difficult to accurately record and patient is refusing standing weights. Patient as continued on home diuretic until AKI developed and home diuretic was  []  TI: Consider R heart cath outpatient   - torsemide as above  - discontinued home metolazone 5/1  - Spirono --> amiloride changed 5/1 --> D/c-ed 5/5  - Difficult volume exam     #Chest Pain - resolved   Initially presented with chest pain on admission. Trops mildly elevated but stable. EKG showed ST depressions seen on prior but no reciprocal changes. Chest pain seems to be exertional and/pr [leuritic  so differential mainly stable angina vs type 2 NSTEMI from CHF exacerbation. She was monitored on tele with no acute events.   []  TI: consider ischemic eval outpatient     #AKI - resolved   Creat baseline ~0.8. Developed AKI, likely iso overdiuresis. Cr today 0.8.  - CTM     #Hyponatremia - resolved   Hyponatremic to 131 on admission and continues to be hyponatremic throughou hospitalization, though levels fluctuating. Urine Osm 89 and Urine Na <20 then 37. Hyponatremia etiology likely has changed with fluctuating volume status. Likely due to low effective circulating volume due to CHF exacerbation but was later caused by aggressive diuretics. Unclear why urine osm so low as patient did not seem to have primary polydipsia.   - CTM    #Chronic pain  #Somnolence   Pt with recurrence of burning shooting  pain from hips/lower back to anterior thighs, to her shins. Has known spinal stenosis with sciatica. On many sedating medications for pain, which have been adjusted due to somnolence.   - continue clotrimazole 1% BID, diclofenac 1% 2g TID, Lidocane patch 3 qam,  - Continue methocarbamol 750 mg TID  - Continue Lorazepam 0.5 mg TID PRN   -  Continue Gabapentin 500 mg BID    #Constipation  Chronic constipation. Didn't have bowel movement for about 1-2 weeks on initial presentation. On miralax at home. Senna has helped in previous hospitalizations.   - Senna and Miralax scheduled   - PRN enema     #Isolated Elevated AlkPhos   Elevated on admission. No RUQ pain. Other LFTs WNL.   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 On 2L NC at home, but was only recently started on home O2 within the past month . Supplemental O2 has been titrated iso of both baseline COPD and O2 requirement on top of COPD exacerbation. Continues to beafebrile, withouttachypnea, withoutuse of accessory muscles, andno coughing. O2   - Contine home budesonide-formoterol, albutero q6h prn, fluticasone prn  - Continue NC 2 L overnight PRN. No daytime O2 need.   - Patient expresses concern about a dysfunction with her home O2 device. Discussed with RT -- because this is a home device from a different company, an RT from that [place] will need to come to her outpatient to assist with this.   [ ]   TI: Needs a sleep study given overnight O2 need     #Polycythemia - Resolved  Polycythemiaresolving, likely due toreturn ofintravascular  volume.  -continue to monitor    #Leukocytosis - Resolved  Normalized.A febrile and hemodynamically stable.  -continue to monitor    Telemetry: Yes    Foley: No    FEN: diabetes diet, PO    Prophylaxis: heparin 5,000 units    Medication Reconciliation: Done    Code Status  Full Code    Health care proxy: Data Unavailable      Dispo: medically clear pending stable electrolytes on afternoon BMP      Deeann Dowse, MD  PGY-1      Discussed patient case and plan with attending, Mickey Farber., MD

## 2022-01-21 NOTE — Initial Assessments (Signed)
Inpatient Physical Therapy Initial Evaluation    S: "I want to work with PT to get stronger. I need to have back and knee surgery, but I should lose weight before them. Let's go for a walk!"    O: PT initial evaluation completed today. Please see Vital Sign and Pain Assessment flowsheets for vitals and pain documentation.  Plan of care has been reviewed and updated as necessary.       01/21/22 1010   Vital Signs   Pulse 95  (fluctuated between 95 - 111 t/o session)   Oxygen Therapy   SpO2 93 %  (fluctuated between 93-95% at rest and during ambulation)   O2 Device RA        01/21/22 1004   Language Information   Language of Care English   Evaluation Type   Evaluation Type Initial Evaluation   Rehab Discipline   Rehab Discipline PT   Safety Devices   Type of Devices Call bell in place   Weight Bearing Status   RLE FWB   LLE FWB   RUE FWB   LUE FWB   Services prior to admission?   Type of Home Care Services None   Premorbid Mobility   Transfers Independent   Walking Used assistive device;Household distances only   Walking assistive devices used Rollator   Stair negotiation Independent   ADL / IADL Baseline Status   ADL/IADL Baseline Status Yes   ADL's/IADL's   Dressing Independent   Bathing Independent   Toileting Independent   Household Chores Required assistance   Premorbid Sensory   Hearing Normal   Premorbid Restaurant manager, fast food   (psych history so mental status fluctuates)   Premorbid Communication   Communication Normal   Living Situation   Living Setting Homeless/transient;Group home  (Per documentation - was living at group home)   Patient Stated Goals   Patient stated goals "I want to get better with PT. I don't want to go to a nursing home"   Strengths   Strengths Adaptive/Assistive products   Barriers   Barriers Comorbidities;Assets/ limited resources;Home design;Ability to acquire knowledge   Expression   Primary Mode of Expression Verbal   Cognition   Decision Making Impaired   Impulse Control Impaired    Orientation Level Grossly Intact   Proprioception   Proprioception deficits? No apparent deficits   RUE Assessment   RUE Assessment WFL   LUE Assessment   LUE Assessment WFL   RLE Assessment   RLE Assessment   (grossly > = 3/5 MMT based on functional observation; limited by chronic bilateral knee pain)   LLE Assessment   LLE Assessment   (grossly > = 3/5 MMT based on functional observation; limited by chronic bilateral knee pain)   Mobility / Balance   Mobility / Balance Yes   Bed Mobility   Supine to Sit   (activity did not occur; recieved sleeping in bedside chair)   Sit to Supine   (activity did not occur; returned back to bedside chair at end of PT eval)   Transfers   Transfer Yes   Transfer 1   Transfer From 1 Chair with arms   Transfer Type 1 To and from   Technique 1 Sit to stand;Stand to sit   Transfer Device 1 None   Transfer Level of Assistance 1 Independent   Trials/Comments 1 1   Gait   Gait Yes   Gait 1   Assistive Device 1 Rollator (Four wheel walker)  Pattern 1 Decreased stride length;R Decreased heel strike;L Decreased heel strike   Gait Assistance 1 Supervision   Distance (Ft) 1 150 Feet  (room <> stairs in hallway with 2 seated rest breaks in wheelchair to relieve chronic low back pain)   Stair Management Technique 1 One rail R;One rail L;Step to pattern;Forwards  (step to L LE to dec L knee pain - patient initially completing opposite pattern despite cues and education so writer then reinforced step to L LE - dec pain noted with pattern)   Stair Management Assistance 1 Close supervision;Mod verbal cues;Contact guard   Number of Stairs 1 13  (initially did 3 steps with L railing then requested to rest in wheelchair; then did 10 steps with R railing after)   Activity Tolerance   Activity Tolerance Tolerates 20 - 30 min treatment with multiple rests   Balance   Sitting - Static Supports self independently with both upper extremities;Feet supported   Sitting - Dynamic Bilateral upper extremity  supported;Feet supported;Moves/returns truncal midpoint greater than 2 inches in all planes   Standing - Static Bilateral upper extremity supported;Able to maintain 60 sec   Standing - Dynamic Forward lean;Reaching for objects   Posture Rounded shoulders;Forward head;Kyphosis   Proofreader Observation Tense;Stiff   Plan   Prognosis Good   PT Frequency 6x/wk   Recommendation   Recommendation Other (Comment)  (Respite house)   Equipment Recommended Rollator (Four wheel walker)       A: Pt is a 51 year old female with PMH including CHF, A fib, COPD on 2L home O2, seizure disorder on keppra, active cocaine use, obesity, chronic low back and bilateral knee pain (L > R d/t L meniscus tear), complex psychiatric hx including PTSD, bipolar, schizophrenia, and multiple hospitalizations for SA who presented with chest pain and SOB. Patient is admitted for CHF exacerbation?and nephrology was consulted for persistent hypokalemia. Patient initially on active SI precautions, now no longer needed and does not meet criteria for inpatient psych. From 3/20 - 4/20, patient was seen on and off by PT and psych OT. Prior to hospitalization, multiple different reports stating that patient is homeless/transient vs resides in a group home, but that she does not like the group home. Per team at rounds, there is an active search for a respite house for patient. At baseline, patient ambulates with a rollator.      Patient presented to PT eval with limitations in long distance ambulation and stairs d/t fatigue/DOE from obesity/COPD/CHF exacerbation and chronic lumbar spine and bilateral knee pain (L > R). See objective findings above for functional performance. Patient did not require any physical assistance to mobilize as she mostly needed supervision and close sup/contact guard, increased cues, and encouragement on stairs for safety given complex psych history and chronic pain. Patient seemed very motivated to work  with PT and progress her strength and mobility for eventual D/C, but does not want to go to STR. Rehab team will continue to improve long distance ambulation and safety/ability on stairs during admission.       PT Short Term Goals = PT Long Term Goals  Patient will continue to perform EOB/chair transfers independently in 4 treatments.  Patient will ambulate > = 200 feet independently with rollator (four wheel walker) in 8 treatments.  Patient will ascend and descend 15 steps using either railing with step to pattern and forwards independently in 8 treatments.      P: PT 6 times  a week while inpatient for transfer training, gait training, and therapeutic exercise as tolerated. Recommend Respite house upon d/c.       [name], [place], [place] # [id number]

## 2022-01-21 NOTE — Progress Notes (Signed)
CASE MANAGER REASSESSMENT NOTE      Case Reviewed with: MDR attendants     Level of Care:  IP      Next review date if insurance: daily    Change in status from initial assessment: No    New information that will affect transition plan: No    Anticipated Discharge plan: Discussed  During MDR, pt is now medically cleared for discharge pending SNF placement. PT eval done, recommending respite stay. Variance days done. CM met with pt per request, pt updated regarding referrals, all pending.     Per patient choice, referrals made to:     [facility]- no bed  [facility]- left msg to admissions  [facility]- left msg to admissions  [facility]- spoke w/ Lattie Haw, will review  [facility]- left message to liaison

## 2022-01-21 NOTE — e-consult (Signed)
NEPHROLOGY E-CONSULT PROGRESS NOTE    DATE: 01/21/2022   ADMISSION DATE & TIME: 01/11/22      INTERVAL HISTORY    NAEON  uop 20 torsemide yesterday  900 cc in following 4 hrs; in 960, out 1600, net -640    no K repletion yesterday w amiloride; 3.6 3pm, > 3.8 today    STR dc plan for now    VITALS  BP 117/85   Pulse 75   Temp 98.1 F (36.7 C) (Oral)   Resp 16   Ht 5\' 9"  (1.753 m)   Wt (!) 152.7 kg (336 lb 11.2 oz)   SpO2 93%   BMI 49.72 kg/m         HOME MEDICATIONS:  Prior to Admission Medications   Prescriptions Last Dose Informant Patient Reported? Taking?   FLUoxetine (PROZAC) 20 MG capsule   No No   Sig: Take 1 capsule by mouth in the morning.   Loratadine 10 MG CAPS   No No   Sig: Take 10 mg by mouth in the morning.   OTHER MEDICATION   No No   Sig: Wheelchair: Electric  For use as directed.    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 Begin with 2 LPM and monitor oxygen saturation using a pulse oximeter.  2. If oxygen saturation is below 90%, increase the flow rate by 0.5 LPM every 15-30 minutes until the desired saturation level of 90%-95% is achieved.  3. If oxygen saturation is above 95%, decrease the flow rate by 0.5 LPM every 15-30 minutes until the desired saturation level of 90%-95% is achieved.  4. Once the desired saturation level is achieved, maintain the flow rate at that level.    DX: COPD with Hypoxia, Requires continuous supplemental oxygen  ICD10: Code: J44.9 and Z99.81    SIGNED Georgianne Fick, CNP, 01/05/2022    Nurse Practitioner   Hospitalist Division  [facility]  Un[address] [place] [address] [place] [address]  Phone:[phone] (fastest) or [phone]   Email:[email or web address]     NPI: [id number]   OXcarbazepine (TRILEPTAL) 150 MG tablet   No No   Sig: Take 1 tablet by mouth in the morning and 1 tablet before  bedtime.   QUEtiapine (SEROQUEL) 50 MG tablet   No No   Sig: Take 1 tablet by mouth at bedtime   Torsemide 60 MG TABS   No No   Sig: Take 120 mg by mouth in the morning and 120 mg in the evening.   albuterol HFA 108 (90 Base) MCG/ACT inhaler   No No   Sig: Inhale 2 puffs into the lungs every 6 (six) hours as needed for Wheezing   aspirin 81 MG chewable tablet   No No   Sig: Take 1 tablet by mouth in the morning.   atorvastatin (LIPITOR) 80 MG tablet   No No   Sig: Take 1 tablet by mouth in the morning.   azithromycin (ZITHROMAX) 500 MG tablet   No No   Sig: Take 1 tablet by mouth 3 (three) times a week   budesonide-formoterol (SYMBICORT) 160-4.5 MCG/ACT inhaler   No No   Sig: Inhale 2 puffs into the lungs in the morning and 2 puffs before bedtime.  buprenorphine-naloxone (SUBOXONE) 8-2 MG sublingual tablet   No No   Sig: Place 1 tablet under the tongue in the morning and 1 tablet at noon and 1 tablet before bedtime. Max Daily Amount: 3 tablets. Do all this for 7 days.   clotrimazole (LOTRIMIN AF) 1 % cream   No No   Sig: Apply topically 2 (two) times daily   diclofenac (VOLTAREN) 1 % GEL Gel   No No   Sig: Apply 2 g topically 3 (three) times daily as needed (knee pain)   fluticasone (FLONASE) 50 MCG/ACT nasal spray   No No   Sig: 1 spray by Each Nostril route 2 (two) times daily as needed (congestion)   gabapentin (NEURONTIN) 300 MG capsule   No No   Sig: Take 2 capsules by mouth in the morning and 2 capsules at noon and 2 capsules before bedtime.   levETIRAcetam (KEPPRA) 750 MG tablet   No No   Sig: Take 1 tablet by mouth in the morning and 1 tablet before bedtime.   levETIRAcetam (KEPPRA) 750 MG tablet   No No   Sig: Take 1 tablet by mouth in the morning and 1 tablet before bedtime.   levothyroxine (SYNTHROID) 25 MCG tablet   No No   Sig: Take 1 tablet by mouth every morning before breakfast   lidocaine (SALONPAS) 4 % PTCH   No No   Sig: Apply 1 patch topically in the morning.   loxapine (LOXITANE) 25 MG capsule    No No   Sig: Take 1 capsule by mouth in the morning and 1 capsule before bedtime.   methocarbamol (ROBAXIN) 750 MG tablet   No No   Sig: Take 1 tablet by mouth in the morning and 1 tablet at noon and 1 tablet before bedtime.   metolazone (ZAROXOLYN) 5 MG tablet   No No   Sig: Take 1 tablet by mouth in the morning.   nicotine (NICODERM CQ) 21 MG/24HR   No No   Sig: Place 1 patch onto the skin in the morning.   nicotine polacrilex (NICORETTE) 4 MG gum   No No   Sig: Take 1 each by mouth every 2 (two) hours as needed for Craving (Nicotine)   nicotine polacrilex (NICORETTE) 4 MG gum   No No   Sig: Take 1 each by mouth every 2 (two) hours as needed for Craving (Nicotine)   pantoprazole (PROTONIX) 40 MG tablet   No No   Sig: Take 1 tablet by mouth in the morning.   prazosin (MINIPRESS) 1 MG capsule   No No   Sig: Take 3 capsules by mouth nightly   torsemide 60 MG TABS   No No   Sig: Take 120 mg by mouth in the morning and 120 mg at noon and 120 mg before bedtime.      Facility-Administered Medications: None        HOSPITAL MEDICATIONS:  Scheduled Meds:   torsemide  10 mg Oral Daily    QUEtiapine  100 mg Oral Nightly    acetaminophen  1,000 mg Oral Q6H SCH    nystatin   Topical BID    lidocaine  3 patch Topical Q24H    gabapentin  500 mg Oral TID    methocarbamol  500 mg Oral TID    polyethylene glycol  17 g Oral Daily    sennosides  17.2 mg Oral Nightly    heparin (porcine)  5,000 Units Subcutaneous Q12H [facility]  aspirin  81 mg Oral Daily    atorvastatin  80 mg Oral Daily    azithromycin  500 mg Oral Once per day on Mon Wed Fri    budesonide-formoterol  2 puff Inhalation BID    buprenorphine-naloxone  8 mg Sublingual TID    clotrimazole   Topical BID    FLUoxetine  20 mg Oral Daily    levETIRAcetam  750 mg Oral BID    levothyroxine  25 mcg Oral DAILY    loratadine  10 mg Oral Daily    loxapine  25 mg Oral BID    nicotine  1 patch Transdermal Daily    OXcarbazepine  150 mg Oral BID    pantoprazole   40 mg Oral Daily    prazosin  3 mg Oral Nightly       PRN Meds:  LORazepam, albuterol HFA, diclofenac, nicotine polacrilex    Infusions:         RECENT DATA:  Recent labs, imaging, and EKGs personally reviewed. Relevant findings noted here and/or discussed in A&P below.    Labs:    Chemistries:  Recent Labs     01/18/22  1440 01/19/22  0712 01/19/22  1533 01/20/22  0730 01/20/22  1448 01/21/22  0728   NA 131* 133* 133* 134* 135* 136   K 3.6 3.2* 3.7 3.9 3.6 3.8   CO2 31 33* 31 30 28 30    BUN 26* 29* 27* 27* 26* 22*   CREAT 0.9 1.0 0.9 0.8 0.9 0.8   CA 9.7 9.5 10.0 9.4 9.4 9.3   MG 2.2 2.1 2.1 2.2  --  2.2   PHOS 3.5 3.5 3.3 3.1  --  3.8   ANION 11 10 12 11 12 10    GFR > 60 > 60 > 60 > 60 > 60 > 60    GI:  No results for input(s): AST, ALT, TBILI, DBILI, IBIL, ALKPHOS, GGTP, ALBUMIN, LIP in the last 72 hours.   CBC:  Recent Labs     01/19/22  0712 01/20/22  0730 01/21/22  0728   WBC 4.3 4.3 5.8   HGB 13.6 13.4 13.0   HCT 41.4 41.1 40.7   PLTA 400 379 382    Coags:   No results for input(s): INR, PT, APTT, HPTT, FIB in the last 72 hours.   Trops, BNP, D-dimer, Lactate, C-RP:  No results for input(s): TROPI, PROBNP, DDPEDVT, DD, LACTICACID, CRP in the last 72 hours.    Finger Sticks:  No results for input(s): FINGERSTICKR in the last 72 hours. Urinalysis:    Recent Labs     01/18/22  1657   UACOL YELLOW   UACLA SL CLOUDY*   UAGLU NEGATIVE   UABIL NEGATIVE   UAKET NEGATIVE   SPEGRAVURINE 1.010   UAOCC NEGATIVE   UAPH 7.0   UAPRO NEGATIVE   UANIT NEGATIVE   LEUKOCYTES NEGATIVE                ASSESSMENT & PLAN:  51 year old female with CHF, A fib, COPD, complex psychiatric hx with multiple hospitalizations for SA p/w chest pain and SOB. Admitted for CHF exacerbation(4/25-)and nephrology was consulted for persistent hypokalemia.        Problems:  #Hypokalemiain the setting ofhypovolemia/perpetuated bymetabolic alkalosis  #metabolic alkalosis   #HFpEF  - hold the course with   - for mtn diuretic dosing: agree w  10 mg torsemide daily w close monitoring of uop and K  Goal would be 0 to -500 cc/s net for the day  - goal K is normal range, will need to see K repletion needs while on torsemide 10, likely will be dc'd on it  - BMP within 3-4 days postdc    #hyponatremia, hypo or euvolemic likely hypo-osmotic, improved  Initially likely hypovolemic from for diuresis, home maintained LE through 2/ 2.2 polydipsia vs SIADH  Improving w dec PO fluid intake     #AKI, resolved  Had AKI 4/27 HD2 iso aggressive diuresis now back to baseline        VTE PPx: heparin subq    Diet: Diet - Base Diet: Regular  Code: Full Code  Contact/HCP: Data Unavailable Data Unavailable  PCP: [name], MD        Elyse Hsu, MD, 01/21/2022  Discussed w Dr Marisa Sprinkles    PGY3 [phone]

## 2022-01-21 NOTE — Plan of Care (Signed)
Problem: Activity:  Goal: Mobility will be supported throughout the hospitalization,  Intervention: Assess ambulation throughout the hospitalization, and per protocol  Note: Cheryl Zimmerman is sitting up on side of bed snoring, able to answer questions, easily arousable, pt able to place self in lying position, speaks clearly, lethargic, call bell within reach, informed by charge nurse this is her baseline, no am medications given until pt wakes up more. Continue POC

## 2022-01-21 NOTE — Progress Notes (Signed)
SOCIAL WORK - REASSESSMENT       Case Reviewed with: Patient and Provider    Current Level Of Care: [facility]    Insurance Concerns: N/A    Initial social worker Request: Homeless/Housing Insecure  and SUD      Change In Status From Initial Assessment: N/A    New Information That Will Affect Transition Plan: N/A     Anticipated D/C Plan: Plan is for pt to go to STR.      Per Patient Choice, Referrals Made To: N/A    Barriers To Plan: N/A    Patient/Responsible Party Understanding of Final Plan: Yes, Able to repeat back plan with understanding

## 2022-01-22 DIAGNOSIS — R070 Pain in throat: Secondary | ICD-10-CM

## 2022-01-22 DIAGNOSIS — F1990 Other psychoactive substance use, unspecified, uncomplicated: Secondary | ICD-10-CM | POA: Diagnosis not present

## 2022-01-22 DIAGNOSIS — I5033 Acute on chronic diastolic (congestive) heart failure: Secondary | ICD-10-CM | POA: Diagnosis not present

## 2022-01-22 DIAGNOSIS — K5909 Other constipation: Secondary | ICD-10-CM

## 2022-01-22 DIAGNOSIS — E876 Hypokalemia: Secondary | ICD-10-CM | POA: Diagnosis not present

## 2022-01-22 DIAGNOSIS — E878 Other disorders of electrolyte and fluid balance, not elsewhere classified: Secondary | ICD-10-CM | POA: Diagnosis not present

## 2022-01-22 LAB — BASIC METABOLIC PANEL
ANION GAP: 12 mmol/L (ref 10–22)
BUN (UREA NITROGEN): 18 mg/dL (ref 7–18)
CALCIUM: 9.2 mg/dL (ref 8.5–10.5)
CARBON DIOXIDE: 27 mmol/L (ref 21–32)
CHLORIDE: 98 mmol/L (ref 98–107)
CREATININE: 0.8 mg/dL (ref 0.4–1.2)
ESTIMATED GLOMERULAR FILT RATE: >60 mL/min (ref >60)
Glucose Random: 123 mg/dL (ref 74–160)
POTASSIUM: 3.9 mmol/L (ref 3.5–5.1)
SODIUM: 137 mmol/L (ref 136–145)

## 2022-01-22 LAB — CBC WITH PLATELET
ABSOLUTE NRBC COUNT: 0 10*3/uL (ref 0.0–0.0)
HEMATOCRIT: 40.4 % (ref 34.1–44.9)
HEMOGLOBIN: 13.1 g/dL (ref 11.2–15.7)
MEAN CORP HGB CONC: 32.4 g/dL (ref 31.0–37.0)
MEAN CORPUSCULAR HGB: 30.8 pg (ref 26.0–34.0)
MEAN CORPUSCULAR VOL: 94.8 fl (ref 80.0–100.0)
MEAN PLATELET VOLUME: 10 fL (ref 8.7–12.5)
NRBC %: 0 % (ref 0.0–0.0)
PLATELET COUNT: 376 10*3/uL (ref 150–400)
RBC DISTRIBUTION WIDTH STD DEV: 48.9 fL — ABNORMAL HIGH (ref 35.1–46.3)
RED BLOOD CELL COUNT: 4.26 M/uL (ref 3.90–5.20)
WHITE BLOOD CELL COUNT: 6.2 10*3/uL (ref 4.0–11.0)

## 2022-01-22 LAB — PHOSPHORUS MAGNESIUM
MAGNESIUM: 2 mg/dL (ref 1.6–2.6)
PHOSPHORUS: 3.3 mg/dL (ref 2.5–4.9)

## 2022-01-22 MED ORDER — TORSEMIDE 10 MG PO TABS
10.00 mg | ORAL_TABLET | Freq: Once | ORAL | Status: AC
Start: 2022-01-22 — End: 2022-01-22
  Administered 2022-01-22: 10 mg via ORAL
  Filled 2022-01-22: qty 1

## 2022-01-22 MED ORDER — BENZOCAINE-MENTHOL 15-3.6 MG MT LOZG
1.0000 | LOZENGE | OROMUCOSAL | Status: DC | PRN
Start: 2022-01-22 — End: 2022-01-27
  Administered 2022-01-22 – 2022-01-26 (×12): 1 via BUCCAL
  Filled 2022-01-22 (×13): qty 1

## 2022-01-22 MED ORDER — POTASSIUM CHLORIDE CRYS ER 20 MEQ PO TBCR
40.00 meq | EXTENDED_RELEASE_TABLET | Freq: Once | ORAL | Status: AC
Start: 2022-01-22 — End: 2022-01-22
  Administered 2022-01-22: 40 meq via ORAL
  Filled 2022-01-22: qty 2

## 2022-01-22 NOTE — Progress Notes (Addendum)
HOSPITALIST PROGRESS NOTE     Subjective / 24 hr Events   Complains of sore throat since last night, wondering if she has thrush. No fever. Mild cough. No neck soreness.   Small BM this morning    Weight this morning 347 lbs standing, was 336lbs on 5/5 and 5/4.     Leg swelling overall better which she is pleased with    Inpatient Meds  ? torsemide  10 mg Oral Daily   ? QUEtiapine  100 mg Oral Nightly   ? acetaminophen  1,000 mg Oral Q6H SCH   ? nystatin   Topical BID   ? lidocaine  3 patch Topical Q24H   ? gabapentin  500 mg Oral TID   ? methocarbamol  500 mg Oral TID   ? polyethylene glycol  17 g Oral Daily   ? sennosides  17.2 mg Oral Nightly   ? heparin (porcine)  5,000 Units Subcutaneous Q12H SCH   ? aspirin  81 mg Oral Daily   ? atorvastatin  80 mg Oral Daily   ? azithromycin  500 mg Oral Once per day on Mon Wed Fri   ? budesonide-formoterol  2 puff Inhalation BID   ? buprenorphine-naloxone  8 mg Sublingual TID   ? clotrimazole   Topical BID   ? FLUoxetine  20 mg Oral Daily   ? levETIRAcetam  750 mg Oral BID   ? levothyroxine  25 mcg Oral DAILY   ? loratadine  10 mg Oral Daily   ? loxapine  25 mg Oral BID   ? nicotine  1 patch Transdermal Daily   ? OXcarbazepine  150 mg Oral BID   ? pantoprazole  40 mg Oral Daily   ? prazosin  3 mg Oral Nightly        PRN meds  benzocaine-menthol, LORazepam, albuterol HFA, diclofenac, nicotine polacrilex     Objective   VS: BP 111/72   Pulse 76   Temp 98.1 ?F (36.7 ?C) (Oral)   Resp 16   Ht 5\' 9"  (1.753 m)   Wt (!) 157.7 kg (347 lb 9.6 oz)   SpO2 96%   BMI 51.33 kg/m?    No intake/output data recorded.    Physical Exam   GEN:     sitting up in chair, feeding self breakfast.  HEENT: Head atraumatic. EOMI. Mild pharyneal edema, but no thrush or exudate seen  No LAD  CV: RRR  PULM:   Difficult to appreciate lung sounds. No obvious crackles or wheezing. No accessory mm use. RA.  ABD:     Obese, NTND  EXT:     +1 pitting LE edema - stable, no deformities   NEURO:  No focal  deficits appreciated    Labs/Studies:  Recent Labs     01/19/22  1533 01/20/22  0730 01/20/22  1448 01/21/22  0728 01/21/22  1420 01/22/22  0758   NA 133* 134* 135* 136 136 137   K 3.7 3.9 3.6 3.8 4.1 3.9   CL 90* 94* 95* 96* 96* 98   CO2 31 30 28 30 28 27    BUN 27* 27* 26* 22* 22* 18   CREAT 0.9 0.8 0.9 0.8 0.9 0.8   GLUCOSER 128 117 138 132 107 123   CA 10.0 9.4 9.4 9.3 9.8 9.2   MG 2.1 2.2  --  2.2 2.1 2.0     Recent Labs     01/20/22  0730 01/21/22  0728 01/22/22  0758  WBC 4.3 5.8 6.2   HGB 13.4 13.0 13.1   HCT 41.1 40.7 40.4   PLTA 379 382 376   MCV 94.7 96.0 94.8       Micro: COVID neg 4/25    Imaging:  XR Chest Portable - Result Date: 01/11/2022  IMPRESSION: No acute cardiopulmonary findings on portable chest radiograph    XR Abdomen (KUB) with Upright and or Decubitus = Result Date: 01/04/2022  IMPRESSION: 1.  Moderate to large colonic stool burden. No bowel obstruction. 2.  Suboptimal for evaluation of free air.       CT Chest WO Contrast - Result Date: 12/30/2021  IMPRESSION: Hyperinflation with dependent atelectasis and a 7 mm nodule in the right upper lobe. A follow-up chest CT is recommended in 12 months per Fleischner criteria.       Echo 12/07/21  Conclusions: (click link below for full report)  1. Left Ventricle: There is mild concentric hypertrophy.  2. Left Ventricle: Global systolic function: EF is estimated visually at 70% .  3. Left Ventricle: Regional systolic function: Wall motion: There are no regional wall motion abnormalities.  4. Right Ventricle: Not well imaged. Valves not well imaged.  5. Pericardium: There is no pericardial effusion.     Assessment/Plan   60 F with HFpEF (EF 705%), AFib, COPD, bronchial asthma (2L home O2), seizure disorder, BPD, schizoaffective disorder, polysubstance use d/o, PTSD, active cocaine use, obesity presented with chest pain and was?admitted for SI, hypoK 2.6,?substernal chest pressure,?SOB consistent with CHF exacerbation. Iinitially on active SI precautions,  now no longer on active SI precautions and does not meet criteria for inpatient psych. C/c/b hypokalemia and difficulty with safe discharge planning. Now stable and referrals for STR.   ?  #Hypokalemia, hypochloremia, etabolic Alkalosis - At this point suspect was due to high diuretic dose (metolazone + 120mg  torsemide bid). has needed aggressive K repletion, but is now normalized after brief time on amiloride, and swelling continues to be controlled on lower dose torsemide, however 5/6 weight increased 336 --> 347 lbs.   - continue torsemide 10mg  daily; will check with nursing about this weight, but if true, will give another 10mg  torsemide tonight  - add back spironolactone prior to dc; amiloride dc'ed  - nephrology following, see note  - daily weights, I/O    #HFpEF (EF 70%) with exacerbation -- previous diuretic dose was torsemide 120 mg BID and metolazone, unclear previous compliance. Diuretics pared back due to lyte abn.   []  TI: Consider R heart cath outpatient   - torsemide 10mg  as abovel discontinued home metolazone 5/1  - spirono consider add back prior to dc given HFpEF; eventual SGLT2 and ARNI consider  ?  #SI, Polysubstance Use, Housing Insecurity - presented with active SI without plan.?High risk due to multiple?recent hospitalizations 2/2 multiple?suicide?attempts. 1:1 sitter dc'ed after psych consulted, and patient this morning is pleasant and cooperative.   - SW consulted, appreciate recs  - Psych consulted, appreciate recs  - seroquel increased to 100mg  qHS     #sore throat - possibly viral. Will defer strep testing based on Centor criteria 0. No evidence of thrush - --monitor throat exam while here, add cepacol.     #Chest Pain - resolved. Consider ischemic eval outpatient   #AKI - Cr peak 1.5, likely from diuresis. Now resolved.   #Hyponatremia - resolved.   #Chronic pain, somnolence - Pt with recurrence of burning shooting pain from hips/lower back to anterior thighs, to her shins. Has known  spinal stenosis with sciatica. On many sedating medications for pain, which have been adjusted due to somnolence.   - continue clotrimazole 1% BID, diclofenac 1% 2g TID, Lidocane patch 3 qam,  - Continue ?methocarbamol 750 mg TID  - Continue Lorazepam 0.5 mg TID PRN   - Continue Gabapentin?500 mg BID  ?  #Constipation - chronic constipation. Didn't have bowel movement for about 1-2 weeks on initial presentation. On miralax at home. Senna has helped in previous hospitalizations.   - Senna and Miralax scheduled, PRN enema   ?  #Isolated Elevated AlkPhos - Elevated on admission. No RUQ pain. Other LFTs WNL. CTM   ?  Chronic and Resolved Issues:  #Seizure Disorder:?levetiracetam 750 mg, oxacarbazepine 150 mg BID  #CAD: Aspirin 81 daily  #HLD: atorvastatin 80 mg  #Hypothyroidism: levothyroxine 25 mcg with breakfast  #Schizoaffective Disorder: Loxapine 25 mg, quetiapine 50 mg  #OUD:?buprenorphine-naloxone 8-2 mg SL  #TUD: nicotine gum 4 mg  #GERD: pantoprazole 40 mg qam  ?  #COPD - VBG reassuring. On 2L NC at home, but was only recently started on home O2 within the past month . Supplemental O2 has been titrated iso of both baseline COPD and O2 requirement on top of COPD exacerbation.   - Continue home budesonide-formoterol, albutero q6h prn, fluticasone prn  - Continue NC 2 L overnight PRN. No daytime O2 need.   - Patient expresses concern about a dysfunction with her home O2 device. Discussed with RT -- because this is a home device from a different company, an RT from that specific company will need to come to her outpatient to assist with this.     TI: Needs a sleep study given overnight O2 need  ??  Diet: Diet - Base Diet: Regular  Ppx: heparin  Code Status: Full Code  HCP: Data Unavailable Data Unavailable  Dispo: referred for STR; medically stable for dc    ADDENDUM  Nursing repeated standing weight, and confirmed 346lbs.   BP 96/66   -will repeat later and if above 100, will redose torsemide  again tonight.    -holding parameters added to prazosin to hold for SBP < 

## 2022-01-22 NOTE — Progress Notes (Signed)
S:  Pt drowsy on arrival. Declined later visit.   O: Please see Vital Sign and Pain Assessment flowsheets for vitals and pain documentation.  Plan of care has been reviewed.   01/22/22 1058   Language Information   Language of Care English   Rehab Discipline   Rehab Discipline PT   Weight Bearing Status   RLE FWB   LLE FWB   RUE FWB   LUE FWB   Precautions   Total Knee Replacement Precaution Knee immobilizer   Mobility / Balance   Mobility / Balance Yes   Bed Mobility   Supine to Sit Independent   Transfers   Transfer Yes   Transfer 1   Transfer From 1 Stand   Transfer Type 1 To   Transfer to 1 Chair with arms   Technique 1 Sit to stand;Stand to sit   Transfer Device 1 None   Transfer Level of Assistance 1 Independent   Gait   Gait Yes   Gait 1   Pattern 1 Decreased stride length;R Decreased heel strike;L Decreased heel strike   Gait Assistance 1 Supervision   Distance (Ft) 1 200 Feet   Stair Management Technique 1   (declined)   Activity Tolerance   Activity Tolerance Tolerates 10 - 20 min treatment with multiple rests   Balance   Sitting - Static Supports self independently with both upper extremities;Feet supported   Sitting - Dynamic Bilateral upper extremity supported;Feet supported;Moves/returns truncal midpoint greater than 2 inches in all planes   Standing - Static Bilateral upper extremity supported;Able to maintain 60 sec   Standing - Dynamic Forward lean;Reaching for objects   Posture Rounded shoulders;Forward head;Kyphosis   Proofreader Observation Stiff;Tense   Safety Devices   Type of Devices Call bell in place   Plan   Prognosis Good   PT Frequency 6x/wk   Recommendation   Recommendation Other (Comment)  (respite house)   Equipment Recommended Rollator (Four wheel walker)         A:  Pt transfers independently from bed. On this date preferred not to use rollator. I want to get stronger" Did well without AD. Ambulated 200' with supervision. Lateral sway Pt declined stairs 2/2  feeling tired. BP 152/87 O2 95%. In chair all needs in reach.   P: Continue per PT Care Plan.   Monia Pouch, PTA, Lic # [id number]

## 2022-01-22 NOTE — Plan of Care (Addendum)
Problem: Activity:  Goal: Mobility will be supported throughout the hospitalization,  Intervention: Assess ambulation throughout the hospitalization, and per protocol  Note: Barth Kirks ambulates with rollinator to nursing station, smiling, Voltaren gel applied to bil knees for pain with good relief, nicotine patch applied to RUE, sitting up in chair eating breakfast watching TV, c/o sore throat, see MAR. Cooperative with care, easily agitated at times but redirectable   cepacol given for c/o sore throat, sitting up eating meals

## 2022-01-22 NOTE — Plan of Care (Addendum)
Problem: Knowledge Deficit  Goal: Patient/S.O. demonstrates understanding of disease process, treatment plan, medications, and discharge instructions.  Description: Complete learning assessment and assess knowledge base  Outcome: Progressing     Problem: Safety:  Goal: Will remain free from falls throughout the hospitalization,  Outcome: Progressing     Problem: Activity:  Goal: Mobility will be supported throughout the hospitalization,  Outcome: Progressing    Patient is alert and awake, oriented x 3, anxious and follow commends. Remains on +1 edema on lower extremities bilateral. Steady gait and ambulatory independently. Refused heparin sc. VS stable and no acute event at night, 93% O2 sat at RA, 2047 and 96% O2 sat at NC 2L, 0523. Applied on 2L, NC at Night time only.  Continue to monitor.

## 2022-01-23 DIAGNOSIS — E873 Alkalosis: Secondary | ICD-10-CM | POA: Diagnosis not present

## 2022-01-23 DIAGNOSIS — E876 Hypokalemia: Secondary | ICD-10-CM | POA: Diagnosis not present

## 2022-01-23 DIAGNOSIS — E878 Other disorders of electrolyte and fluid balance, not elsewhere classified: Secondary | ICD-10-CM | POA: Diagnosis not present

## 2022-01-23 DIAGNOSIS — R070 Pain in throat: Secondary | ICD-10-CM | POA: Diagnosis not present

## 2022-01-23 LAB — COVID-19 ANTIGEN: COVID-19 ANTIGEN: NEGATIVE

## 2022-01-23 LAB — BASIC METABOLIC PANEL
ANION GAP: 11 mmol/L (ref 10–22)
BUN (UREA NITROGEN): 18 mg/dL (ref 7–18)
CALCIUM: 9.2 mg/dL (ref 8.5–10.5)
CARBON DIOXIDE: 28 mmol/L (ref 21–32)
CHLORIDE: 100 mmol/L (ref 98–107)
CREATININE: 0.7 mg/dL (ref 0.4–1.2)
ESTIMATED GLOMERULAR FILT RATE: >60 mL/min (ref >60)
Glucose Random: 137 mg/dL (ref 74–160)
POTASSIUM: 3.6 mmol/L (ref 3.5–5.1)
SODIUM: 139 mmol/L (ref 136–145)

## 2022-01-23 MED ORDER — BISACODYL 10 MG PR SUPP
10.0000 mg | Freq: Every day | RECTAL | Status: DC | PRN
Start: 2022-01-23 — End: 2022-01-27

## 2022-01-23 MED ORDER — TORSEMIDE 10 MG PO TABS
10.00 mg | ORAL_TABLET | Freq: Once | ORAL | Status: AC
Start: 2022-01-23 — End: 2022-01-23
  Administered 2022-01-23: 10 mg via ORAL
  Filled 2022-01-23: qty 1

## 2022-01-23 MED ORDER — FLUTICASONE PROPIONATE 50 MCG/ACT NA SUSP
1.0000 | Freq: Every day | NASAL | Status: DC
Start: 2022-01-23 — End: 2022-01-27
  Administered 2022-01-23 – 2022-01-27 (×5): 1 via NASAL
  Filled 2022-01-23: qty 16

## 2022-01-23 MED ORDER — NYSTATIN 100000 UNIT/ML MT SUSP
5.0000 mL | Freq: Four times a day (QID) | OROMUCOSAL | Status: DC
Start: 2022-01-23 — End: 2022-01-27
  Administered 2022-01-23 – 2022-01-27 (×18): 500000 [IU] via ORAL
  Filled 2022-01-23 (×18): qty 5

## 2022-01-23 MED ORDER — TORSEMIDE 20 MG PO TABS
20.0000 mg | ORAL_TABLET | Freq: Every day | ORAL | Status: DC
Start: 2022-01-24 — End: 2022-01-26
  Administered 2022-01-24 – 2022-01-26 (×3): 20 mg via ORAL
  Filled 2022-01-23 (×3): qty 1

## 2022-01-23 NOTE — Plan of Care (Signed)
Problem: Inadequate Coping  Goal: Verbalizes adaptive coping mechanisms  Description: Able to verbalize adaptive coping mechanisms such as physical activity, distraction, and deep breathing exercises.  Outcome: Progressing     Problem: Inadequate Coping  Goal: Demonstrates ability to cope effectively  Description: Patient is able to verbalize feelings related to emotional state.  Outcome: Progressing     Problem: Knowledge Deficit  Goal: Patient/S.O. demonstrates understanding of disease process, treatment plan, medications, and discharge instructions.  Description: Complete learning assessment and assess knowledge base  Outcome: Progressing     Patient is alert and awake, oriented x 3, follows commends, remains on seizure precaution, anxious and depressed mood noted.  Given ativan 0.5 mg po for anxiety. Current smoking cigarettes and applied for nicotine therapy, patch and gum. Continue to support for smoking cessation. VS stable. Resting and sleeping at this time. Continue to monitor.

## 2022-01-23 NOTE — Progress Notes (Signed)
PROGRESS NOTE  01/23/2022    PATIENT INFO: Cheryl Zimmerman, [age] year old female  DATE OF ADMISSION: 01/11/22    ROOM/BED LOCATION:  423/423-A  HOSPITAL DAY: 12    REVIEW OF OVERNIGHT EVENTS:  Gave another 10 Torsemide and gave 40 meq K    SUBJECTIVE:  Having some knee pain after doing stairs with PT  Having sore throat - lozenges helping a bit   Denies increased leg swelling or trouble breathing  Endorsed DOE, stable from baseline     OBJECTIVE:  BP: (88-112)/(61-75)   Temp:  [97.9 F (36.6 C)-98.6 F (37 C)]   Pulse:  [67-79]   Resp:  [18-20]   SpO2:  [92 %-95 %]     INS/OUTS (PAST 24 HOURS)  I/O 24 Hrs:  In: 1320 [P.O.:1320]  Out: -     PHYSICAL EXAM   GEN:   Sitting up in chair, non-toxic  HEENT:    Head atraumatic. EOMI. Posterior oropharynx without exudates.  Neck: No cervical lymphadenopathy   CV:      RRR  PULM:      Difficult to appreciate lung sounds. No obvious crackles or wheezing. No accessory mm use. RA.   ABD:      Obese, NTND  EXT:      +1-2 pitting LE edema - stable, no deformities   NEURO:     No focal deficits appreciated     Weight: 346 (5/6) --> 344 (5/7)     RECENT LABS  BMP:   Recent Labs     01/20/22  1448 01/21/22  0728 01/21/22  1420 01/22/22  0758 01/23/22  1020   NA 135* 136 136 137 139   K 3.6 3.8 4.1 3.9 3.6   CL 95* 96* 96* 98 100   CO2 28 30 28 27 28    BUN 26* 22* 22* 18 18   CREAT 0.9 0.8 0.9 0.8 0.7   GLUCOSER 138 132 107 123 137     Ca, Mg, Phos:   Recent Labs     01/20/22  1448 01/21/22  0728 01/21/22  1420 01/22/22  0758 01/23/22  1020   CA 9.4 9.3 9.8 9.2 9.2   MG  --  2.2 2.1 2.0  --    PHOS  --  3.8 3.7 3.3  --      CBC:   Recent Labs     01/21/22  0728 01/22/22  0758   WBC 5.8 6.2   HCT 40.7 40.4   HGB 13.0 13.1   PLTA 382 376   RBC 4.24 4.26       MICROBIOLOGY REVIEW  No new microbiology results to review.    IMAGING AND OTHER STUDIES  Telemetry notable findings: none.    MEDICATIONS  See list in EPIC    ASSESSMENT & PLAN   8 F with CHF, A. Fib, COPD,  bronchial asthma, on 2L home O2, seizure disorder on keppra, BPD, Schizoaffective disorder, polysubstance use on buprenorphine maintenance, PTSD, multiple SA, active cocaine use, obesity presented with chest pain and wasadmitted for SI, hypoK 2.6,substernal chest pressure,SOB consistent with CHF exacerbation. Iinitially on active SI precautions, now no longer on active SI precautions and does not meet criteria for inpatient psych. C/c/b hypokalemia and difficulty with safe discharge planning.    #Electrolyte Abnormalities   #Hypokalemia   #Hypochloremia   #Metabolic Alkalosis   Patient has had multiple electrolyte abnormalities since inial hospitalization. Patient has required aggressive potassium repletion in particular,  requiring over 100 mEq of potassium most days. Hypokalemia and metabolic alkalosis initially thought to be due to patient's agressive diuresis plan. Though urine Cl very elevated in 50s, which would not be expected with diuresis. Patient has no signs of hyperaldosteronism, has no diarrhea or vommiting. If diuretics are not the main cause of electrolyte imbalances, differential includes possibly Gitelman syndrome (which can appear in adulthood) as patient has muscle cramps and spasms, polyuria, metabolic alkalosis, low Cl and severe hypokalemia though Mg levels have been normal and K on chart review has been normal expect for the past month and would assume that for genetic condition that would be slow increase in K. Her Torsemide was held while repleting K, and resumed at  daily with BID BMP checks.  - Torsemide  daily, will give another 10 today for a total of  daily given her weight gain, and plan for  tomorrow morning   - Consider resume C/H spironolactone this week for HFpEF  - CTM daily BMP  - Nephrology following  - Daily weights, I/O    #Sore throat   No exudates, some subtle generalized erythema of posterior oropharynx. No lymphadenopathy. No cough.   - Continue  lozenges  - Repeat COVID test     #SI  #Polysubstance Use   #Housing Insecurity  Presented with active SI without plan. High risk due to multiplerecent hospitalizations 2/2 multiplesuicideattempts. Has been verbally aggressive to staff and has refused labs or medication at times. Initially on 1:1 sitter, psych was consulted and eventually patient no longer needed a sitter and was safe from a psychiatric perspective for discharge.   - SW consulted, appreciate recs  - Psych consulted, appreciate recs:  Does not meet section 12 criteria. No psychiatric contraindication to discharge. May increase seroquel to 100 nightly.       #HFpEF (EF 70%)  Patient found to be fluid overloaded on admission. On torsemide 120 mg BID, though unclear patient's compliance. Patient's volume status inpatient  has been difficult to determine due to patient's body habitus, baseline O2 requirement, and likely chronic leg edema. Strict I/Os have been ordered as well as daily standing weights but I/Os have been difficult to accurately record and patient is refusing standing weights. Patient was continued on home diuretic, developed AKI, and then her diuretic titration was complicated by ongoing hypokalemia.    TI: Consider R heart cath outpatient   - torsemide as above  - discontinued home metolazone 5/1  - Spirono discontinued while amiloride was started, then amiloride discontinued and patient had weight gain. May consider resume spirono this week and monitoring how she does on the 20 torsemide   - Difficult volume exam     #Chest Pain - resolved   Initially presented with chest pain on admission. Trops mildly elevated but stable. EKG showed ST depressions seen on prior but no reciprocal changes. Chest pain seems to be exertional and/pr [leuritic  so differential mainly stable angina vs type 2 NSTEMI from CHF exacerbation. She was monitored on tele with no acute events.    TI: consider ischemic eval outpatient     #AKI - resolved    Creat baseline ~0.8. Developed AKI, likely iso overdiuresis. Cr today 0.8.  - CTM     #Hyponatremia - resolved   Hyponatremic to 131 on admission and continues to be hyponatremic throughou hospitalization, though levels fluctuating. Urine Osm 89 and Urine Na <20 then 37. Hyponatremia etiology likely has changed with fluctuating volume status. Likely due to  low effective circulating volume due to CHF exacerbation but was later caused by aggressive diuretics. Unclear why urine osm so low as patient did not seem to have primary polydipsia.   - CTM    #Chronic pain  #Somnolence   Pt with recurrence of burning shooting pain from hips/lower back to anterior thighs, to her shins. Has known spinal stenosis with sciatica. On many sedating medications for pain, which have been adjusted due to somnolence.   - continue clotrimazole 1% BID, diclofenac 1% 2g TID, Lidocane patch 3 qam,  - Continue methocarbamol 750 mg TID  - Continue Lorazepam 0.5 mg TID PRN   - Continue Gabapentin 500 mg BID    #Constipation  Chronic constipation. Didn't have bowel movement for about 1-2 weeks on initial presentation. On miralax at home. Senna has helped in previous hospitalizations.   - Senna and Miralax scheduled   - PRN enema     #Isolated Elevated AlkPhos   Elevated on admission. No RUQ pain. Other LFTs WNL.   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 On 2L NC at home, but was only recently started on home O2 within the past month . Supplemental O2 has been titrated iso of both baseline COPD and O2 requirement on top of COPD  exacerbation. Continues to beafebrile, withouttachypnea, withoutuse of accessory muscles, andno coughing. O2   - Contine home budesonide-formoterol, albutero q6h prn, fluticasone prn  - Continue NC 2 L overnight PRN. No daytime O2 need.   - Patient expresses concern about a dysfunction with her home O2 device. Discussed with RT -- because this is a home device from a different company, an RT from that specific company will need to come to her outpatient to assist with this.   [ ]   TI: Needs a sleep study given overnight O2 need     #Polycythemia - Resolved  Polycythemiaresolving, likely due toreturn ofintravascular volume.  -continue to monitor    #Leukocytosis - Resolved  Normalized.A febrile and hemodynamically stable.  -continue to monitor    Telemetry: Yes    Foley: No    FEN: diabetes diet, PO    Prophylaxis: heparin 5,000 units    Medication Reconciliation: Done    Code Status  Full Code    Health care proxy: Data Unavailable      Dispo: medically clear pending safe dispo plan       , MD  PGY-1      Discussed patient case and plan with attending, Dr. Vena Rua

## 2022-01-23 NOTE — Plan of Care (Addendum)
Problem: Safety:  Goal: Will remain free from falls throughout the hospitalization,  Outcome: Progressing     Problem: Cognitive:  Goal: Caregivers and patients knowledge of risk factors and measures for prevention of condition will be supported throughout the hospitalization  Outcome: Progressing     Problem: Activity:  Goal: Mobility will be supported throughout the hospitalization,  Outcome: Progressing    Patient is alert and awake, oriented x 3, anxious and follows commends. Remains on seizure precautions,  C/o generalized and both knee pain, Given acetaminophen 650 mg po as scheduled, except midnight dose due to sleeping. As ordered, given torsemide 10 mg with potassium chloride 40 mEq po. VS stable. Steady gait and ambulatory independently in the room and hallway. Continue to monitor.

## 2022-01-23 NOTE — Progress Notes (Signed)
S:  I want to try to do the stairs.   O: Please see Vital Sign and Pain Assessment flowsheets for vitals and pain documentation.  Plan of care has been reviewed.   01/23/22 1226   Language Information   Language of Care English   Rehab Discipline   Rehab Discipline PT   Weight Bearing Status   RLE FWB   LLE FWB   RUE FWB   LUE FWB   Mobility / Balance   Mobility / Balance Yes   Transfers   Transfer Yes   Transfer 1   Transfer From 1 Stand   Transfer Type 1 To and from   Transfer to 1 Chair with arms   Technique 1 Sit to stand;Stand to sit   Transfer Device 1 None   Transfer Level of Assistance 1 Independent   Gait   Gait Yes   Gait 1   Assistive Device 1 None   Pattern 1 Decreased stride length;R Decreased heel strike;L Decreased heel strike   Gait Assistance 1 Independent   Distance (Ft) 1 200 Feet   Stair Management Technique 1 One rail R;Step to pattern   Stair Management Assistance 1 Close supervision   Number of Stairs 1 5   Activity Tolerance   Activity Tolerance Tolerates 10 - 20 min treatment with multiple rests   Balance   Sitting - Static Supports self independently with both upper extremities;Feet supported   Sitting - Dynamic Bilateral upper extremity supported;Feet supported;Moves/returns truncal midpoint greater than 2 inches in all planes   Standing - Static Bilateral upper extremity supported;Able to maintain 60 sec   Standing - Dynamic Forward lean;Reaching for objects   Posture Rounded shoulders;Forward head;Kyphosis   Coordination   Gross Motor Performance Observation Tense;Stiff   Safety Devices   Type of Devices Call bell in place   Plan   Prognosis Good   PT Frequency 6x/wk   Recommendation   Recommendation Other (Comment)  (respite house.)   Equipment Recommended Rollator (Four wheel walker)         A:  Pt eager to participate with PT today. Independently ambulates 225 without AD. No LOB.  Completed 5 stairs with rail. Pt experienced increased right sided lumbar pain. Requested to sit on  stairs as the pain subsided.  Pain decreased and Pt able to ambulate back to room.  Pain cream applied at Pt's request and RN notified , as well as request for Tylenol.  All needs in reach.   P: Continue per PT Care Plan.   Harlin Rain, PTA, Lic # [id number]

## 2022-01-23 NOTE — Case Mgmt Note (Signed)
CASE MANAGER REASSESSMENT NOTE      Case Reviewed with: MDR     Level of Care:  IP      Next review date if insurance: daily    Change in status from initial assessment: None    New information that will affect transition plan: No    Anticipated Discharge plan:   Discussed in MDR. Titrating diuresis. Dispo pending SNF placement.    Per patient choice, referrals made to:     [facility]-pending  [facility]- pending  [facility]- pending  [facility]- pending  [facility]-pending

## 2022-01-23 NOTE — Plan of Care (Signed)
Problem: Activity:  Goal: Mobility will be supported throughout the hospitalization,  Intervention: Assess ambulation throughout the hospitalization, and per protocol  Note: Cheryl Zimmerman is sitting up in chair po medications taken po, Voltaren gel bil knees with good effect, breakfast taken good appetite,

## 2022-01-24 ENCOUNTER — Ambulatory Visit (HOSPITAL_BASED_OUTPATIENT_CLINIC_OR_DEPARTMENT_OTHER): Payer: Medicare Other | Admitting: Family

## 2022-01-24 DIAGNOSIS — R070 Pain in throat: Secondary | ICD-10-CM | POA: Diagnosis not present

## 2022-01-24 DIAGNOSIS — E873 Alkalosis: Secondary | ICD-10-CM | POA: Diagnosis not present

## 2022-01-24 DIAGNOSIS — Z591 Inadequate housing, unspecified: Secondary | ICD-10-CM

## 2022-01-24 DIAGNOSIS — E878 Other disorders of electrolyte and fluid balance, not elsewhere classified: Secondary | ICD-10-CM | POA: Diagnosis not present

## 2022-01-24 DIAGNOSIS — E876 Hypokalemia: Secondary | ICD-10-CM | POA: Diagnosis not present

## 2022-01-24 LAB — BASIC METABOLIC PANEL
ANION GAP: 11 mmol/L (ref 10–22)
BUN (UREA NITROGEN): 19 mg/dL — ABNORMAL HIGH (ref 7–18)
CALCIUM: 9.7 mg/dL (ref 8.5–10.5)
CARBON DIOXIDE: 26 mmol/L (ref 21–32)
CHLORIDE: 100 mmol/L (ref 98–107)
CREATININE: 0.8 mg/dL (ref 0.4–1.2)
ESTIMATED GLOMERULAR FILT RATE: >60 mL/min (ref >60)
Glucose Random: 144 mg/dL (ref 74–160)
POTASSIUM: 3.9 mmol/L (ref 3.5–5.1)
SODIUM: 137 mmol/L (ref 136–145)

## 2022-01-24 LAB — PHOSPHORUS MAGNESIUM
MAGNESIUM: 2.1 mg/dL (ref 1.6–2.6)
PHOSPHORUS: 3.5 mg/dL (ref 2.5–4.9)

## 2022-01-24 LAB — HOLD PURPLE TOP TUBE

## 2022-01-24 MED ORDER — SPIRONOLACTONE 25 MG PO TABS
25.0000 mg | ORAL_TABLET | Freq: Every day | ORAL | Status: DC
Start: 2022-01-25 — End: 2022-01-24

## 2022-01-24 NOTE — Progress Notes (Signed)
PROGRESS NOTE  01/24/2022    PATIENT INFO: Katheren Puller, [age] year old female  DATE OF ADMISSION: 01/11/22    ROOM/BED LOCATION:  423/423-A  HOSPITAL DAY: 13    REVIEW OF OVERNIGHT EVENTS:  NAONE    SUBJECTIVE:  Sleeping this morning, awoken and says doing "OK".     OBJECTIVE:  BP: (103-116)/(72-79)   Temp:  [97.2 F (36.2 C)-98.2 F (36.8 C)]   Pulse:  [63-88]   Resp:  [18-20]   SpO2:  [91 %-95 %]     INS/OUTS (PAST 24 HOURS)  I/O 24 Hrs:  In: 1440 [P.O.:1440]  Out: -     PHYSICAL EXAM   GEN:   Laying in bed sleeping, awakens to voice   HEENT:    Head atraumatic. EOMI.   CV:      RRR  PULM:      Difficult to appreciate lung sounds. No obvious crackles or wheezing. No accessory mm use. 2L NC.   ABD:      Obese, NTND  EXT:      +2 pitting LE edema   NEURO:     No focal deficits appreciated     Weight: 346 (5/6) --> 344 (5/7)     RECENT LABS  BMP:   Recent Labs     01/21/22  0728 01/21/22  1420 01/22/22  0758 01/23/22  1020   NA 136 136 137 139   K 3.8 4.1 3.9 3.6   CL 96* 96* 98 100   CO2 30 28 27 28    BUN 22* 22* 18 18   CREAT 0.8 0.9 0.8 0.7   GLUCOSER 132 107 123 137     Ca, Mg, Phos:   Recent Labs     01/21/22  0728 01/21/22  1420 01/22/22  0758 01/23/22  1020   CA 9.3 9.8 9.2 9.2   MG 2.2 2.1 2.0  --    PHOS 3.8 3.7 3.3  --      CBC:   Recent Labs     01/21/22  0728 01/22/22  0758   WBC 5.8 6.2   HCT 40.7 40.4   HGB 13.0 13.1   PLTA 382 376   RBC 4.24 4.26       MICROBIOLOGY REVIEW  No new microbiology results to review.    IMAGING AND OTHER STUDIES  Telemetry notable findings: none.    MEDICATIONS  See list in EPIC    ASSESSMENT & PLAN   75 F with CHF, A. Fib, COPD, bronchial asthma, on 2L home O2, seizure disorder on keppra, BPD, Schizoaffective disorder, polysubstance use on buprenorphine maintenance, PTSD, multiple SA, active cocaine use, obesity presented with chest pain and wasadmitted for SI, hypoK 2.6,substernal chest pressure,SOB consistent with CHF exacerbation. Iinitially on active  SI precautions, now no longer on active SI precautions and does not meet criteria for inpatient psych. C/c/b hypokalemia and difficulty with safe discharge planning.    #Electrolyte Abnormalities   #Hypokalemia   #Hypochloremia   #Metabolic Alkalosis   Patient has had multiple electrolyte abnormalities since inial hospitalization. Patient has required aggressive potassium repletion in particular, requiring over 100 mEq of potassium most days. Hypokalemia and metabolic alkalosis initially thought to be due to patient's agressive diuresis plan. Though urine Cl very elevated in 50s, which would not be expected with diuresis. Patient has no signs of hyperaldosteronism, has no diarrhea or vommiting. If diuretics are not the main cause of electrolyte imbalances, differential includes possibly Gitelman syndrome (  which can appear in adulthood) as patient has muscle cramps and spasms, polyuria, metabolic alkalosis, low Cl and severe hypokalemia though Mg levels have been normal and K on chart review has been normal expect for the past month and would assume that for genetic condition that would be slow increase in K. Her Torsemide was held while repleting K, and resumed at 10mg  daily with BID BMP checks.  - Torsemide 20mg  daily   - Consider starting spironolactone   - CTM daily BMP  - Nephrology following, signed off today   - Daily weights, I/O    #Sore throat   No exudates, some subtle generalized erythema of posterior oropharynx. No lymphadenopathy. No cough. Uses inhalers and was not rinsing mouth out after for a few days, says it feels like her prior thrush.   - Continue lozenges  - Continue nystatin swish   - COVID negative    #SI  #Polysubstance Use   #Housing Insecurity  Presented with active SI without plan. High risk due to multiplerecent hospitalizations 2/2 multiplesuicideattempts. Has been verbally aggressive to staff and has refused labs or medication at times. Initially on 1:1 sitter, psych was consulted  and eventually patient no longer needed a sitter and was safe from a psychiatric perspective for discharge.   - SW consulted, appreciate recs  - Psych consulted, appreciate recs:  Does not meet section 12 criteria. No psychiatric contraindication to discharge. May increase seroquel to 100 nightly.       #HFpEF (EF 70%)  Patient found to be fluid overloaded on admission. On torsemide 120 mg BID, though unclear patient's compliance. Patient's volume status inpatient  has been difficult to determine due to patient's body habitus, baseline O2 requirement, and likely chronic leg edema. Strict I/Os have been ordered as well as daily standing weights but I/Os have been difficult to accurately record and patient is refusing standing weights. Patient was continued on home diuretic, developed AKI, and then her diuretic titration was complicated by ongoing hypokalemia.   []  TI: Consider R heart cath outpatient   - torsemide as above  - discontinued home metolazone 5/1  - Consider spironolactone   - Difficult volume exam     #Chest Pain - resolved   Initially presented with chest pain on admission. Trops mildly elevated but stable. EKG showed ST depressions seen on prior but no reciprocal changes. Chest pain seems to be exertional and/pr [leuritic  so differential mainly stable angina vs type 2 NSTEMI from CHF exacerbation. She was monitored on tele with no acute events.   []  TI: consider ischemic eval outpatient     #AKI - resolved   Creat baseline ~0.8. Developed AKI, likely iso overdiuresis. Cr today 0.8.  - CTM     #Hyponatremia - resolved   Hyponatremic to 131 on admission and continues to be hyponatremic throughou hospitalization, though levels fluctuating. Urine Osm 89 and Urine Na <20 then 37. Hyponatremia etiology likely has changed with fluctuating volume status. Likely due to low effective circulating volume due to CHF exacerbation but was later caused by aggressive diuretics. Unclear why urine osm so low as patient  did not seem to have primary polydipsia.   - CTM    #Chronic pain  #Somnolence   Pt with recurrence of burning shooting pain from hips/lower back to anterior thighs, to her shins. Has known spinal stenosis with sciatica. On many sedating medications for pain, which have been adjusted due to somnolence.   - continue clotrimazole 1% BID, diclofenac  1% 2g TID, Lidocane patch 3 qam,  - Continue methocarbamol 750 mg TID  - Continue Lorazepam 0.5 mg TID PRN   - Continue Gabapentin 500 mg BID    #Constipation  Chronic constipation. Didn't have bowel movement for about 1-2 weeks on initial presentation. On miralax at home. Senna has helped in previous hospitalizations.   - Senna and Miralax scheduled   - PRN enema     #Isolated Elevated AlkPhos   Elevated on admission. No RUQ pain. Other LFTs WNL.   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 On 2L NC at home, but was only recently started on home O2 within the past month . Supplemental O2 has been titrated iso of both baseline COPD and O2 requirement on top of COPD exacerbation. Continues to beafebrile, withouttachypnea, withoutuse of accessory muscles, andno coughing. O2   - Contine home budesonide-formoterol, albutero q6h prn, fluticasone prn  - Continue NC 2 L overnight PRN. No daytime O2 need.   - Patient expresses concern about a dysfunction with her home O2 device. Discussed with RT -- because this is a home device from a different company, an RT from that [place] will need to come to her outpatient to assist with this.   [ ]   TI: Needs a sleep study  given overnight O2 need     #Polycythemia - Resolved  Polycythemiaresolving, likely due toreturn ofintravascular volume.  -continue to monitor    #Leukocytosis - Resolved  Normalized.A febrile and hemodynamically stable.  -continue to monitor    Telemetry: Yes    Foley: No    FEN: diabetes diet, PO    Prophylaxis: heparin 5,000 units    Medication Reconciliation: Done    Code Status  Full Code    Health care proxy: Data Unavailable      Dispo: medically clear pending safe dispo plan       Deeann Dowse, MD  PGY-1      Discussed patient case and plan with attending, Dr. Rita Ohara

## 2022-01-24 NOTE — Progress Notes (Signed)
SOCIAL WORK - REASSESSMENT       Case Reviewed with: Patient and Provider    Current Level Of Care: [facility]    Insurance Concerns: N/A    Initial social worker Request: Homeless/Housing Insecure , AUD  and SUD      Change In Status From Initial Assessment: N/A    New Information That Will Affect Transition Plan: N/A    Anticipated D/C Plan: Plan is for pt to go to [facility]    Per Patient Choice, Referrals Made To: N/A    Barriers To Plan: N/A    Patient/Responsible Party Understanding of Final Plan: Yes, Able to repeat back plan with understanding

## 2022-01-24 NOTE — Progress Notes (Signed)
CASE MANAGER REASSESSMENT NOTE      Case Reviewed with: MDR attendants     Level of Care: IP      Next review date if insurance: Daily    Change in status from initial assessment: No    New information that will affect transition plan: N/A    Anticipated Discharge plan: Remains medically clear. No bed offer until this time. Caitlyn of [facility] will help finding a bed as well. Pt updated.    Per patient choice, referrals made to:     [facility]- no bed available  [facility]- called, unable to leave VM  [facility]- called & LM   [facility]- called & LM   [facility]- called, clinicals resent per request  [facility]- called & sent SMS

## 2022-01-24 NOTE — Progress Notes (Signed)
Nephrology Follow up Visit  Reason for visit: f/u AKI and hypokalemia  S/Events: She is very unhappy that she feels like she is swelling up.  She says does not like it when he will mess with her medications.     ROS: No SOB.  No CP.  No V   IV:   PO:   ? fluticasone  1 spray Each Nostril Daily   ? nystatin  5 mL Oral 4x Daily   ? torsemide  20 mg Oral Daily   ? QUEtiapine  100 mg Oral Nightly   ? acetaminophen  1,000 mg Oral Q6H [place]   ? nystatin   Topical BID   ? lidocaine  3 patch Topical Q24H   ? gabapentin  500 mg Oral TID   ? methocarbamol  500 mg Oral TID   ? polyethylene glycol  17 g Oral Daily   ? sennosides  17.2 mg Oral Nightly   ? heparin (porcine)  5,000 Units Subcutaneous Q12H [place]   ? aspirin  81 mg Oral Daily   ? atorvastatin  80 mg Oral Daily   ? azithromycin  500 mg Oral Once per day on Mon Wed Fri   ? budesonide-formoterol  2 puff Inhalation BID   ? buprenorphine-naloxone  8 mg Sublingual TID   ? clotrimazole   Topical BID   ? FLUoxetine  20 mg Oral Daily   ? levETIRAcetam  750 mg Oral BID   ? levothyroxine  25 mcg Oral DAILY   ? loratadine  10 mg Oral Daily   ? loxapine  25 mg Oral BID   ? nicotine  1 patch Transdermal Daily   ? OXcarbazepine  150 mg Oral BID   ? pantoprazole  40 mg Oral Daily   ? prazosin  3 mg Oral Nightly     PRN: bisacodyl, benzocaine-menthol, LORazepam, albuterol HFA, diclofenac, nicotine polacrilex    Objective: .BP 116/79   Pulse 63   Temp 97.7 ?F (36.5 ?C) (Oral)   Resp 20   Ht 5\' 9"  (1.753 m)   Wt (!) 156.3 kg (344 lb 9.6 oz)   SpO2 95%   BMI 50.89 kg/m?   I/O 24 Hrs:  In: 760 [P.O.:760]  Out: -    Vitals:3 Temp:  [97.2 ?F (36.2 ?C)-98.2 ?F (36.8 ?C)] 97.7 ?F (36.5 ?C)  Pulse:  [63-88] 63  Resp:  [18-20] 20  BP: (103-116)/(72-79) 116/79  FiO2 (%):  [21 %] 21 %  Constitutional: Bouncing legs repetitively.  Eyes: Anicteric sclerae. No conjunctival pallor. No lid lag  Lungs: Clear to auscultation bilaterally. Normal respiratory effort with no intercostal  retractions.  CV: Normal S1 S2. Heart rate and rhythm regular  Extremities:*+2 peripheral edema.    BMP:   Recent Labs     01/21/22  1420 01/22/22  0758 01/23/22  1020 01/24/22  0920   NA 136 137 139 137   K 4.1 3.9 3.6 3.9   CL 96* 98 100 100   CO2 28 27 28 26    BUN 22* 18 18 19*   CREAT 0.9 0.8 0.7 0.8   GLUCOSER 107 123 137 144     Ca, Mg, Phos:   Recent Labs     01/21/22  1420 01/22/22  0758 01/23/22  1020 01/24/22  0920   CA 9.8 9.2 9.2 9.7   MG 2.1 2.0  --  2.1   PHOS 3.7 3.3  --  3.5     CBC:   Recent  Labs     01/22/22  0758   WBC 6.2   HCT 40.4   PLTA 376       BMP:   Recent Labs     01/21/22  1420 01/22/22  0758 01/23/22  1020 01/24/22  0920   NA 136 137 139 137   K 4.1 3.9 3.6 3.9   CL 96* 98 100 100   CO2 28 27 28 26    BUN 22* 18 18 19*   CREAT 0.9 0.8 0.7 0.8   GLUCOSER 107 123 137 144     Ca, Mg, Phos:   Recent Labs     01/21/22  1420 01/22/22  0758 01/23/22  1020 01/24/22  0920   CA 9.8 9.2 9.2 9.7   MG 2.1 2.0  --  2.1   PHOS 3.7 3.3  --  3.5     CBC:   Recent Labs     01/22/22  0758   WBC 6.2   HCT 40.4   PLTA 376   RBC 4.26     Urinalysis:No results for input(s): UACOL, UACLA, UAGLU, UABIL, UAKET, SPEGRAVURINE, UAOCC, UAPH, UAPRO, UANIT, LEUKOCYTES in the last 72 hours.    Invalid input(s): UAOBU      Assessment & Recommendations:  1.  AKI: Resolved.    2.  Hypokalemia doing well.  I would favor starting spironolactone.    3. CHF: Diuresis per the primary team.  I would favor net -1 L/day if we get accurate I's and O's.  Or 0.5 to 1 kg/day weight loss daily  ideally on a standing scale.    I will sign off.  Thank you very much for the opportunity to help with her care.  Please do not hesitate to contact us if we can be of any further assistance.     Thank you very much,  Irven Shelling MD MS  Total time greater than 30 minutes  Office [phone]  Pager [phone]

## 2022-01-24 NOTE — Plan of Care (Signed)
A&OX3, disoriented to time, RA. Reported feeling anxious, PRN Ativan 0.5mg  PO given with improvement. Pt reported having chronic bilateral knee and back pain 6/10, lidocaine patches applied and PRN Tylenol 650mg  given with improvement. Remains on seizure precautions. Pt ambulated in hallway and tolerated well. Safety Maintained, Call bell in reach.

## 2022-01-24 NOTE — Progress Notes (Signed)
Pt discussed during MDR.     SW and RN CM met w/ pt to discuss discharge plan. CM is working on finding pt a STR bed. Pt able to repeat plan back but says "I am frustrating this is taking so long."     SW provided pt clothing and shoes.     SW will remain available.

## 2022-01-24 NOTE — Progress Notes (Signed)
S:  I am doing ok.   O: Please see Vital Sign and Pain Assessment flowsheets for vitals and pain documentation.  Plan of care has been reviewed.   01/24/22 1132   Language Information   Language of Care English   Rehab Discipline   Rehab Discipline PT   Weight Bearing Status   RLE FWB   LLE FWB   RUE FWB   LUE FWB   Mobility / Balance   Mobility / Balance Yes   Transfers   Transfer Yes   Transfer 1   Transfer From 1 Stand   Transfer Type 1 To and from   Transfer to 1 Chair with arms   Technique 1 Sit to stand;Stand to sit   Transfer Device 1 None   Transfer Level of Assistance 1 Independent   Gait   Gait Yes   Gait 1   Assistive Device 1 None   Pattern 1 Decreased stride length;R Decreased heel strike;L Decreased heel strike   Gait Assistance 1 Independent   Distance (Ft) 1 200 Feet   Stair Management Technique 1 One rail L;Alternating pattern   Stair Management Assistance 1 Supervision   Number of Stairs 1 7   Activity Tolerance   Activity Tolerance Tolerates < 10 min activity, no significant change in vital signs   Balance   Sitting - Static Supports self independently with both upper extremities;Feet supported   Sitting - Dynamic Bilateral upper extremity supported;Feet supported;Moves/returns truncal midpoint greater than 2 inches in all planes   Standing - Static Bilateral upper extremity supported;Able to maintain 60 sec   Standing - Dynamic Forward lean;Reaching for objects   Posture Rounded shoulders;Forward head;Kyphosis   Coordination   Gross Motor Performance Observation Stiff;Tense   Safety Devices   Type of Devices Call bell in place   Plan   Prognosis Good   PT Frequency 6x/wk   Recommendation   Recommendation Other (Comment)  (respite house)   Equipment Recommended Rollator (Four wheel walker)  (for relief of back pain.)         A:  Pt independently ambulates 200'  w/o device.  Completed 7 stairs with rail with supervision. Increased right knee discomfort with activity. Is on scheduled Tylenol.  In  chair, all needs in reach.   P: Continue per PT Care Plan.   Monia Pouch, PTA, Lic # [id number]

## 2022-01-25 DIAGNOSIS — I5032 Chronic diastolic (congestive) heart failure: Secondary | ICD-10-CM | POA: Diagnosis not present

## 2022-01-25 DIAGNOSIS — R079 Chest pain, unspecified: Secondary | ICD-10-CM | POA: Diagnosis not present

## 2022-01-25 DIAGNOSIS — E8770 Fluid overload, unspecified: Secondary | ICD-10-CM | POA: Diagnosis not present

## 2022-01-25 DIAGNOSIS — J029 Acute pharyngitis, unspecified: Secondary | ICD-10-CM | POA: Diagnosis not present

## 2022-01-25 LAB — CBC WITH PLATELET
ABSOLUTE NRBC COUNT: 0 10*3/uL (ref 0.0–0.0)
HEMATOCRIT: 40.3 % (ref 34.1–44.9)
HEMOGLOBIN: 12.9 g/dL (ref 11.2–15.7)
MEAN CORP HGB CONC: 32 g/dL (ref 31.0–37.0)
MEAN CORPUSCULAR HGB: 30.6 pg (ref 26.0–34.0)
MEAN CORPUSCULAR VOL: 95.7 fl (ref 80.0–100.0)
MEAN PLATELET VOLUME: 10.1 fL (ref 8.7–12.5)
NRBC %: 0 % (ref 0.0–0.0)
PLATELET COUNT: 357 10*3/uL (ref 150–400)
RBC DISTRIBUTION WIDTH STD DEV: 49.8 fL — ABNORMAL HIGH (ref 35.1–46.3)
RED BLOOD CELL COUNT: 4.21 M/uL (ref 3.90–5.20)
WHITE BLOOD CELL COUNT: 6.3 10*3/uL (ref 4.0–11.0)

## 2022-01-25 LAB — BASIC METABOLIC PANEL
ANION GAP: 8 mmol/L — ABNORMAL LOW (ref 10–22)
BUN (UREA NITROGEN): 19 mg/dL — ABNORMAL HIGH (ref 7–18)
CALCIUM: 9.6 mg/dL (ref 8.5–10.5)
CARBON DIOXIDE: 31 mmol/L (ref 21–32)
CHLORIDE: 99 mmol/L (ref 98–107)
CREATININE: 0.7 mg/dL (ref 0.4–1.2)
ESTIMATED GLOMERULAR FILT RATE: >60 mL/min (ref >60)
Glucose Random: 97 mg/dL (ref 74–160)
POTASSIUM: 3.9 mmol/L (ref 3.5–5.1)
SODIUM: 138 mmol/L (ref 136–145)

## 2022-01-25 LAB — PHOSPHORUS MAGNESIUM
MAGNESIUM: 2.2 mg/dL (ref 1.6–2.6)
PHOSPHORUS: 3.8 mg/dL (ref 2.5–4.9)

## 2022-01-25 MED ORDER — SPIRONOLACTONE 25 MG PO TABS
25.00 mg | ORAL_TABLET | Freq: Once | ORAL | Status: AC
Start: 2022-01-25 — End: 2022-01-25
  Administered 2022-01-25: 25 mg via ORAL
  Filled 2022-01-25: qty 1

## 2022-01-25 MED ORDER — SPIRONOLACTONE 25 MG PO TABS
25.0000 mg | ORAL_TABLET | Freq: Every day | ORAL | Status: DC
Start: 2022-01-25 — End: 2022-01-26
  Administered 2022-01-26: 25 mg via ORAL
  Filled 2022-01-25: qty 1

## 2022-01-25 MED ORDER — SPIRONOLACTONE 25 MG PO TABS
25.0000 mg | ORAL_TABLET | Freq: Every day | ORAL | Status: DC
Start: 2022-01-25 — End: 2022-01-25

## 2022-01-25 MED ORDER — SPIRONOLACTONE 25 MG PO TABS
25.0000 mg | ORAL_TABLET | Freq: Every day | ORAL | Status: DC
Start: 2022-01-26 — End: 2022-01-25

## 2022-01-25 NOTE — Progress Notes (Signed)
Pt discussed during MDR.     SW spoke w/ pt who has concerns relating to her SSI card. Pt needs to be issued a new card and needs a mailing address. SW spoke w/ Leeroy Bock at [place] who confirms that pt should send card to the [place] office at [address] [place]. SW informed pt of address.     Pt is still frustrated and says "I am getting sick of waiting." SW empathized w/ pt and will continue to update pt if there is any progress in d/c plan.     SW will remain available.

## 2022-01-25 NOTE — Case Mgmt Note (Addendum)
CASE MANAGER REASSESSMENT NOTE      Case Reviewed with: MDR     Level of Care:  IP      Next review date if insurance: daily    Change in status from initial assessment: No    New information that will affect transition plan: None    Anticipated Discharge plan:   Discussed in MDR. Titrating PO diuresis, remains medically cleared for dc, on variance days. Spoke w/ [facility] Case Worker [place], they are following and helping w/ the dc placement.    Per patient choice, referrals made to:      [facility]- resent per [facility]'s request -declined  [facility] - pending  [facility]- pending  [facility]- pending  [facility]- pending  [facility]- pending  [facility]

## 2022-01-25 NOTE — Progress Notes (Signed)
Pt Refused EKG 

## 2022-01-25 NOTE — Progress Notes (Signed)
Pt declined 2/2 to feeling SOB. On  1 liter of O2 on arrival. RN notified.   Harlin Rain, PTA, Lic # [id number]

## 2022-01-25 NOTE — Plan of Care (Signed)
A&OX3, disoriented to time, NC 2L at times/ Baseline RA. Reported feeling anxious, PRN Ativan 0.5mg  PO given with improvement. Pt reported having chronic bilateral knee and back pain 7/10, lidocaine patches applied and PRN Tylenol 650mg  given with improvement. Remains on seizure precautions. Pt ambulated in hallway and to the gift shop with front wheel walker accompanied by 2 RNs and tolerated well. Safety Maintained, Call bell in reach.

## 2022-01-25 NOTE — Plan of Care (Signed)
Pt is A+O x3, anxious but cooperative, denies chest pain, SOB. Reporting generalized chronic pains, medicated per MAR.  Seizure precautions maintained, padded bedside rails.  93% O2 on RA.  Ambulating frequently down the hall with even gait.  Sleeping eyes closed in bed, call bell within reach, room door open, bed low and locked.  Problem: Activity:  Goal: Mobility will be supported throughout the hospitalization,  Outcome: Progressing     Problem: Knowledge Deficit  Goal: Patient/S.O. demonstrates understanding of disease process, treatment plan, medications, and discharge instructions.  Description: Complete learning assessment and assess knowledge base  Outcome: Progressing     Problem: Potential for Suicide  Goal: Remain free from self harm  Description: Assess suicide risk on admission and per hospital policy.  Assess and monitor patient's mood and behaviors which may signal an increase in suicidal potential, statements such as "I wish I were dead", acting recklessly, giving away personal possessions, or suicide notes.  Assess patient for symptoms of post-traumatic stress disorder.  Implement suicide precautions per hospital policy.  Collaborate with interdisciplinary team and initiate plan and interventions as ordered.  Outcome: Progressing

## 2022-01-25 NOTE — Progress Notes (Signed)
PROGRESS NOTE  01/25/2022    PATIENT INFO: Barth Kirks, [age] year old female  DATE OF ADMISSION: 01/11/22    ROOM/BED LOCATION:  423/423-A  HOSPITAL DAY: 14    REVIEW OF OVERNIGHT EVENTS:  NAONE    SUBJECTIVE:  Did not sleep well last night. Would like to go to gift shop today.     OBJECTIVE:  BP: (106-116)/(65-71)   Temp:  [97.7 F (36.5 C)-98.5 F (36.9 C)]   Pulse:  [62-82]   Resp:  [17-20]   SpO2:  [93 %-98 %]     INS/OUTS (PAST 24 HOURS)  I/O 24 Hrs:  In: 500 [P.O.:500]  Out: -     PHYSICAL EXAM   GEN:   Sitting up in chair awake. Non-toxic.   HEENT:    Head atraumatic. EOMI.   CV:      RRR  PULM:      Normal WOB on RA.   ABD:      Obese, NTND  EXT:      +2 pitting LE edema   NEURO:     No focal deficits appreciated     Weight: 346 (5/6) --> 344 (5/7) --> 350 (5/9)    RECENT LABS  BMP:   Recent Labs     01/23/22  1020 01/24/22  0920 01/25/22  0743   NA 139 137 138   K 3.6 3.9 3.9   CL 100 100 99   CO2 28 26 31    BUN 18 19* 19*   CREAT 0.7 0.8 0.7   GLUCOSER 137 144 97     Ca, Mg, Phos:   Recent Labs     01/23/22  1020 01/24/22  0920 01/25/22  0743   CA 9.2 9.7 9.6   MG  --  2.1 2.2   PHOS  --  3.5 3.8     CBC:   Recent Labs     01/25/22  0743   WBC 6.3   HCT 40.3   HGB 12.9   PLTA 357   RBC 4.21       MICROBIOLOGY REVIEW  No new microbiology results to review.    IMAGING AND OTHER STUDIES  No new imaging.     MEDICATIONS  See list in EPIC    ASSESSMENT & PLAN   51 F with CHF, A. Fib, COPD, bronchial asthma, on 2L home O2, seizure disorder on keppra, BPD, Schizoaffective disorder, polysubstance use on buprenorphine maintenance, PTSD, multiple SA, active cocaine use, obesity presented with chest pain and wasadmitted for SI, hypoK 2.6,substernal chest pressure,SOB consistent with CHF exacerbation. Iinitially on active SI precautions, now no longer on active SI precautions and does not meet criteria for inpatient psych. C/c/b hypokalemia and difficulty with safe discharge planning.      #HFpEF  (EF 70%)  #Hypervolemia   Patient found to be fluid overloaded on admission. On torsemide 120 mg BID PTA, though unclear patient's compliance. Patient's volume status inpatient  has been difficult to determine due to patient's body habitus, baseline O2 requirement, and likely chronic leg edema. Strict I/Os ordered as well as daily standing weights but I/Os have been difficult to accurately record and patient intermittently refusing standing weights. Patient was continued on home diuretic, developed AKI, and then her diuretic titration was complicated by ongoing hypokalemia. Hypokalemia since resolved and she was started back on Torsemide at low dose 10 which has been uptitrated to 20 QD.   []  TI: Consider R heart cath outpatient   -  torsemide as above, currently 20 QD, may need to increase given weight gain  - discontinued home metolazone 5/1  - refusing spironolactone   - Difficult volume exam  - Daily standing weight, strict I/O (will emphasize on MDR)    #Sore throat   No exudates, some subtle generalized erythema of posterior oropharynx. No lymphadenopathy. No cough. Uses inhalers and was not rinsing mouth out after for a few days, says it feels like her prior thrush.   - Continue lozenges  - Continue nystatin swish   - COVID negative    #Chest pain - returned   Initially presented with chest pain on admission. Trops mildly elevated but stable. EKG showed ST depressions seen on prior but no reciprocal changes. Chest pain seemed to be exertional and/pleuritic, differential stable angina vs type 2 NSTEMI from CHF exacerbation, pressure from fluid overload, anxiety. She was monitored on tele here with no acute events. Today, she reports return of chest pain without hypoxia, using oxygen for comfort.  []  TI: consider ischemic eval outpatient   - Repeat EKG  - Diuresis plan as above, however refusing spirono. If continues to refuse, may need to increase her torsemide dose back up.     #SI  #Polysubstance Use    #Housing Insecurity  Presented with active SI without plan. High risk due to multiplerecent hospitalizations 2/2 multiplesuicideattempts. Has been verbally aggressive to staff and has refused labs or medication at times. Initially on 1:1 sitter, psych was consulted and eventually patient no longer needed a sitter and was safe from a psychiatric perspective for discharge.   - SW consulted, appreciate recs  - Psych consulted, appreciate recs:  Does not meet section 12 criteria. No psychiatric contraindication to discharge. May increase seroquel to 100 nightly.     #Electrolyte Abnormalities   #Hypokalemia - resolved   #Hypochloremiac Metabolic Alkalosis - resolved   Patient has had multiple electrolyte abnormalities since inial hospitalization. Patient has required aggressive potassium repletion in particular, requiring over 100 mEq of potassium most days. Hypokalemia and metabolic alkalosis initially thought to be due to patient's agressive diuresis plan. Though urine Cl very elevated in 50s, which would not be expected with diuresis. Patient has no signs of hyperaldosteronism, has no diarrhea or vommiting. If diuretics are not the main cause of electrolyte imbalances, differential includes possibly Gitelman syndrome (which can appear in adulthood) as patient has muscle cramps and spasms, polyuria, metabolic alkalosis, low Cl and severe hypokalemia though Mg levels have been normal and K on chart review has been normal expect for the past month and would assume that for genetic condition that would be slow increase in K. Her Torsemide was held while repleting K, and resumed/titrated once K was normal.   - Torsemide 20mg  daily, consider dose increase   - Ordered to start spirono 25 today as patient is gaining weight but patient refusing  - CTM daily BMP  - Daily weights, I/O    #AKI - resolved   Creat baseline ~0.8. Developed AKI, likely iso overdiuresis. Cr today 0.8.  - CTM     #Hyponatremia - resolved    Hyponatremic to 131 on admission and continues to be hyponatremic throughou hospitalization, though levels fluctuating. Urine Osm 89 and Urine Na <20 then 37. Hyponatremia etiology likely has changed with fluctuating volume status. Likely due to low effective circulating volume due to CHF exacerbation but was later caused by aggressive diuretics. Unclear why urine osm so low as patient did not seem to have  primary polydipsia.   - CTM    #Chronic pain  #Somnolence   Pt with recurrence of burning shooting pain from hips/lower back to anterior thighs, to her shins. Has known spinal stenosis with sciatica. On many sedating medications for pain, which have been adjusted due to somnolence.   - continue clotrimazole 1% BID, diclofenac 1% 2g TID, Lidocane patch 3 qam,  - Continue methocarbamol 750 mg TID  - Continue Lorazepam 0.5 mg TID PRN   - Continue Gabapentin 500 mg BID    #Constipation  Chronic constipation. Didn't have bowel movement for about 1-2 weeks on initial presentation. On miralax at home. Senna has helped in previous hospitalizations.   - Senna and Miralax scheduled   - PRN enema     #Isolated Elevated AlkPhos   Elevated on admission. No RUQ pain. Other LFTs WNL.   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 On 2L NC at home, but was only recently started on home O2 within the past month . Supplemental O2 has been titrated iso of both baseline COPD and O2 requirement on top of COPD exacerbation. Continues to beafebrile, withouttachypnea, withoutuse of accessory muscles, andno coughing. O2   - [place]ine  home budesonide-formoterol, albutero q6h prn, fluticasone prn  - Continue NC 2 L overnight PRN. No daytime O2 need.   - Patient expresses concern about a dysfunction with her home O2 device. Discussed with RT -- because this is a home device from a different company, an RT from that specific company will need to come to her outpatient to assist with this.   [ ]   TI: Needs a sleep study given overnight O2 need     #Polycythemia - Resolved  Polycythemiaresolving, likely due toreturn ofintravascular volume.  -continue to monitor    #Leukocytosis - Resolved  Normalized.A febrile and hemodynamically stable.  -continue to monitor    Telemetry: Yes    Foley: No    FEN: diabetes diet, PO    Prophylaxis: heparin 5,000 units    Medication Reconciliation: Done    Code Status  Full Code    Health care proxy: Data Unavailable      Dispo: medically clear pending safe dispo plan       Vena RuaHolly Draven Laine, MD  PGY-1      Discussed patient case and plan with attending, Dr. Landis Gandyardoso

## 2022-01-25 NOTE — Plan of Care (Addendum)
Pt is Aox4, cooperative and anxious. 2L NC satting 94%. Scars on L arm. +2 edema of bilateral lower extremities. C/o 7/10 pain at lower extremities, moisturizing cream applied with good relief. Pt refused blood draw by lab team, MD Candee Furbish notified. Bed in lowest position, call bell in reach, seizure precautions in place.     Problem: Activity:  Goal: Mobility will be supported throughout the hospitalization,  Outcome: Progressing     Problem: Safety:  Goal: Will remain free from falls throughout the hospitalization,  Outcome: Progressing

## 2022-01-26 DIAGNOSIS — E8770 Fluid overload, unspecified: Secondary | ICD-10-CM

## 2022-01-26 DIAGNOSIS — J029 Acute pharyngitis, unspecified: Secondary | ICD-10-CM

## 2022-01-26 DIAGNOSIS — I5032 Chronic diastolic (congestive) heart failure: Secondary | ICD-10-CM | POA: Diagnosis not present

## 2022-01-26 DIAGNOSIS — R079 Chest pain, unspecified: Secondary | ICD-10-CM | POA: Diagnosis not present

## 2022-01-26 LAB — BASIC METABOLIC PANEL
ANION GAP: 10 mmol/L (ref 10–22)
BUN (UREA NITROGEN): 19 mg/dL — ABNORMAL HIGH (ref 7–18)
CALCIUM: 9.8 mg/dL (ref 8.5–10.5)
CARBON DIOXIDE: 29 mmol/L (ref 21–32)
CHLORIDE: 99 mmol/L (ref 98–107)
CREATININE: 0.8 mg/dL (ref 0.4–1.2)
ESTIMATED GLOMERULAR FILT RATE: >60 mL/min (ref >60)
Glucose Random: 92 mg/dL (ref 74–160)
POTASSIUM: 4 mmol/L (ref 3.5–5.1)
SODIUM: 138 mmol/L (ref 136–145)

## 2022-01-26 LAB — CBC WITH PLATELET
ABSOLUTE NRBC COUNT: 0 10*3/uL (ref 0.0–0.0)
HEMATOCRIT: 40.7 % (ref 34.1–44.9)
HEMOGLOBIN: 13.1 g/dL (ref 11.2–15.7)
MEAN CORP HGB CONC: 32.2 g/dL (ref 31.0–37.0)
MEAN CORPUSCULAR HGB: 30.8 pg (ref 26.0–34.0)
MEAN CORPUSCULAR VOL: 95.8 fl (ref 80.0–100.0)
MEAN PLATELET VOLUME: 10 fL (ref 8.7–12.5)
NRBC %: 0 % (ref 0.0–0.0)
PLATELET COUNT: 361 10*3/uL (ref 150–400)
RBC DISTRIBUTION WIDTH STD DEV: 50 fL — ABNORMAL HIGH (ref 35.1–46.3)
RED BLOOD CELL COUNT: 4.25 M/uL (ref 3.90–5.20)
WHITE BLOOD CELL COUNT: 6.5 10*3/uL (ref 4.0–11.0)

## 2022-01-26 LAB — PHOSPHORUS MAGNESIUM
MAGNESIUM: 2.2 mg/dL (ref 1.6–2.6)
PHOSPHORUS: 3.6 mg/dL (ref 2.5–4.9)

## 2022-01-26 MED ORDER — TORSEMIDE 20 MG PO TABS
20.00 mg | ORAL_TABLET | Freq: Once | ORAL | Status: AC
Start: 2022-01-26 — End: 2022-01-26
  Administered 2022-01-26: 20 mg via ORAL
  Filled 2022-01-26: qty 1

## 2022-01-26 MED ORDER — SPIRONOLACTONE 25 MG PO TABS
25.00 mg | ORAL_TABLET | Freq: Once | ORAL | Status: AC
Start: 2022-01-26 — End: 2022-01-26
  Administered 2022-01-26: 25 mg via ORAL
  Filled 2022-01-26: qty 1

## 2022-01-26 MED ORDER — TORSEMIDE 20 MG PO TABS
40.0000 mg | ORAL_TABLET | Freq: Every day | ORAL | Status: DC
Start: 2022-01-27 — End: 2022-01-27
  Administered 2022-01-27: 40 mg via ORAL
  Filled 2022-01-26: qty 2

## 2022-01-26 MED ORDER — SPIRONOLACTONE 25 MG PO TABS
50.0000 mg | ORAL_TABLET | Freq: Every day | ORAL | Status: DC
Start: 2022-01-27 — End: 2022-01-27
  Administered 2022-01-27: 50 mg via ORAL
  Filled 2022-01-26: qty 2

## 2022-01-26 NOTE — Progress Notes (Signed)
PROGRESS NOTE  01/26/2022    PATIENT INFO: Cheryl Zimmerman, [age] year old female  DATE OF ADMISSION: 01/11/22    ROOM/BED LOCATION:  423/423-A  HOSPITAL DAY: 15    REVIEW OF OVERNIGHT EVENTS:  Having leg and knee pain.     SUBJECTIVE:  Concerned about increased swelling, particularly of the left arm and leg.   Continues with knee pain.   Urinating about 3 times daily, a bit more than the day prior but denying large amts.    OBJECTIVE:  BP: (111-116)/(76-77)   Temp:  [97.6 F (36.4 C)-98.7 F (37.1 C)]   Pulse:  [73-78]   Resp:  [18-20]   SpO2:  [94 %-98 %]     INS/OUTS (PAST 24 HOURS)  I/O 24 Hrs:  In: 360 [P.O.:360]  Out: -     PHYSICAL EXAM   GEN:   Sitting up in bed awake. Non-toxic.   HEENT:    Head atraumatic. EOMI.   CV:      RRR  PULM:      Normal WOB on RA. Lung sounds distant, no obvious wheezing or crackles  ABD:      Obese, NTND  EXT:      +2 pitting LE edema from ankles to upper thighs, b/l arms edematous   NEURO:     No focal deficits appreciated     Weight: 346 (5/6) --> 344 (5/7) --> 350 (5/9) --> 352 (5/10)    RECENT LABS  BMP:   Recent Labs     01/24/22  0920 01/25/22  0743 01/26/22  0750   NA 137 138 138   K 3.9 3.9 4.0   CL 100 99 99   CO2 26 31 29    BUN 19* 19* 19*   CREAT 0.8 0.7 0.8   GLUCOSER 144 97 92     Ca, Mg, Phos:   Recent Labs     01/24/22  0920 01/25/22  0743 01/26/22  0750   CA 9.7 9.6 9.8   MG 2.1 2.2 2.2   PHOS 3.5 3.8 3.6     CBC:   Recent Labs     01/25/22  0743 01/26/22  0750   WBC 6.3 6.5   HCT 40.3 40.7   HGB 12.9 13.1   PLTA 357 361   RBC 4.21 4.25       MICROBIOLOGY REVIEW  No new microbiology results to review.    IMAGING AND OTHER STUDIES  No new imaging.     MEDICATIONS  See list in EPIC    ASSESSMENT & PLAN   51 F with CHF, A. Fib, COPD, bronchial asthma, on 2L home O2, seizure disorder on keppra, BPD, Schizoaffective disorder, polysubstance use on buprenorphine maintenance, PTSD, multiple SA, active cocaine use, obesity presented with chest pain and  wasadmitted for SI, hypoK 2.6,substernal chest pressure,SOB consistent with CHF exacerbation. Iinitially on active SI precautions, now no longer on active SI precautions and does not meet criteria for inpatient psych. C/c/b hypokalemia and difficulty with safe discharge planning.      #HFpEF (EF 70%)  #Hypervolemia   Patient found to be fluid overloaded on admission. On torsemide 120 mg BID PTA, though unclear patient's compliance. Patient's volume status inpatient  has been difficult to determine due to patient's body habitus, baseline O2 requirement, and likely chronic leg edema. Strict I/Os ordered as well as daily standing weights but I/Os have been difficult to accurately record and patient intermittently refusing standing weights. Patient was  continued on home diuretic, developed AKI, and then her diuretic titration was complicated by ongoing hypokalemia. Discontinued home metolazone 5/1. Hypokalemia since resolved and she was started back on Torsemide at low dose 10 which has been uptitrated to 20 QD.   []  TI: Consider R heart cath outpatient   - torsemide currently 20 QD, will double the dose given her weight gain and increased swelling to 40 QD  - spironolactone 25 QD, will increase to 50 QD  - Difficult volume exam  - Daily standing weight, strict I/O (will emphasize on MDR)    #Sore throat   No exudates, some subtle generalized erythema of posterior oropharynx. No lymphadenopathy. No cough. Uses inhalers and was not rinsing mouth out after for a few days, says it feels like her prior thrush. COVID neg.   - Continue lozenges  - Continue nystatin swish     #Chest pain - returned   Initially presented with chest pain on admission. Trops mildly elevated but stable. EKG showed ST depressions seen on prior but no reciprocal changes. Chest pain seemed to be exertional and/pleuritic, differential stable angina vs type 2 NSTEMI from CHF exacerbation, pressure from fluid overload, anxiety. She was monitored on  tele here with no acute events. Today, she reports return of chest pain without hypoxia, using oxygen for comfort.  []  TI: consider ischemic eval outpatient   - Repeat EKG (patient refused)  - Diuresis plan as above    #SI  #Polysubstance Use   #Housing Insecurity  Presented with active SI without plan. High risk due to multiplerecent hospitalizations 2/2 multiplesuicideattempts. Has been verbally aggressive to staff and has refused labs or medication at times. Initially on 1:1 sitter, psych was consulted and eventually patient no longer needed a sitter and was safe from a psychiatric perspective for discharge.   - SW consulted, appreciate recs  - Psych consulted, appreciate recs:  Does not meet section 12 criteria. No psychiatric contraindication to discharge. May increase seroquel to 100 nightly.       #Hypokalemia - resolved   #Hypochloremiac Metabolic Alkalosis - resolved   Patient has had multiple electrolyte abnormalities since inial hospitalization. Patient has required aggressive potassium repletion in particular, requiring over 100 mEq of potassium most days. Hypokalemia and metabolic alkalosis initially thought to be due to patient's agressive diuresis plan. Though urine Cl very elevated in 50s, which would not be expected with diuresis. Patient has no signs of hyperaldosteronism, has no diarrhea or vommiting. If diuretics are not the main cause of electrolyte imbalances, differential includes possibly Gitelman syndrome (which can appear in adulthood) as patient has muscle cramps and spasms, polyuria, metabolic alkalosis, low Cl and severe hypokalemia though Mg levels have been normal and K on chart review has been normal expect for the past month and would assume that for genetic condition that would be slow increase in K. Her Torsemide was held while repleting K, and resumed/titrated once K was normal.   - CTM BMP    #AKI - resolved   Creat baseline ~0.8. Developed AKI, likely iso overdiuresis. Cr  today 0.8.  - CTM     #Hyponatremia - resolved   Hyponatremic to 131 on admission and continues to be hyponatremic throughou hospitalization, though levels fluctuating. Urine Osm 89 and Urine Na <20 then 37. Hyponatremia etiology likely has changed with fluctuating volume status. Likely due to low effective circulating volume due to CHF exacerbation but was later caused by aggressive diuretics. Unclear why urine osm so low  as patient did not seem to have primary polydipsia.   - CTM    #Chronic pain  #Somnolence   Pt with recurrence of burning shooting pain from hips/lower back to anterior thighs, to her shins. Has known spinal stenosis with sciatica. On many sedating medications for pain, which have been adjusted due to somnolence.   - continue clotrimazole 1% BID, diclofenac 1% 2g TID, Lidocane patch 3 qam,  - Continue methocarbamol 750 mg TID  - Continue Lorazepam 0.5 mg TID PRN   - Continue Gabapentin 500 mg BID    #Constipation  Chronic constipation. Didn't have bowel movement for about 1-2 weeks on initial presentation. On miralax at home. Senna has helped in previous hospitalizations.   - Senna and Miralax scheduled   - PRN enema     #Isolated Elevated AlkPhos   Elevated on admission. No RUQ pain. Other LFTs WNL.   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 On 2L NC at home, but was only recently started on home O2 within the past month . Supplemental O2 has been titrated iso of both baseline COPD and O2 requirement on top of COPD exacerbation. Continues to beafebrile, withouttachypnea, withoutuse of  accessory muscles, andno coughing. O2   - Contine home budesonide-formoterol, albutero q6h prn, fluticasone prn  - Continue NC 2 L overnight PRN. No daytime O2 need.   [ ]  TI: Patient expresses concern about a dysfunction with her home O2 device. Discussed with RT -- because this is a home device from a different company, an RT from that [place] will need to come to her outpatient to assist with this.   [ ]   TI: Needs a sleep study given overnight O2 need     #Polycythemia - Resolved  Polycythemiaresolving, likely due toreturn ofintravascular volume.  -continue to monitor    #Leukocytosis - Resolved  Normalized.A febrile and hemodynamically stable.  -continue to monitor    Telemetry: Yes  Foley: No  FEN: diabetes diet, PO  Prophylaxis: heparin 5,000 units  Medication Reconciliation: Done    Code Status  Full Code      Dispo: medically clear pending stable on diuretic titration and safe dispo plan       Deeann Dowse, MD  PGY-1      Discussed patient case and plan with attending, Dr. Rita Ohara

## 2022-01-26 NOTE — Plan of Care (Signed)
Problem: Activity:  Goal: Mobility will be supported throughout the hospitalization,  Outcome: Progressing     Problem: Cognitive:  Goal: Caregivers and patients knowledge of risk factors and measures for prevention of condition will be supported throughout the hospitalization  Outcome: Progressing     Problem: Safety:  Goal: Will remain free from falls throughout the hospitalization,  Outcome: Progressing     Problem: Knowledge Deficit  Goal: Patient/S.O. demonstrates understanding of disease process, treatment plan, medications, and discharge instructions.  Description: Complete learning assessment and assess knowledge base  Outcome: Progressing     Problem: Inadequate Coping  Goal: Demonstrates ability to cope effectively  Description: Patient is able to verbalize feelings related to emotional state.  Outcome: Progressing  Goal: Verbalizes adaptive coping mechanisms  Description: Able to verbalize adaptive coping mechanisms such as physical activity, distraction, and deep breathing exercises.  Outcome: Progressing     Problem: Potential for Suicide  Goal: Remain free from self harm  Description: Assess suicide risk on admission and per hospital policy.  Assess and monitor patient's mood and behaviors which may signal an increase in suicidal potential, statements such as "I wish I were dead", acting recklessly, giving away personal possessions, or suicide notes.  Assess patient for symptoms of post-traumatic stress disorder.  Implement suicide precautions per hospital policy.  Collaborate with interdisciplinary team and initiate plan and interventions as ordered.  Outcome: Progressing

## 2022-01-26 NOTE — Case Mgmt Note (Addendum)
CASE MANAGER REASSESSMENT NOTE      Case Reviewed with: MDR     Level of Care:  IP      Next review date if insurance: Daily    Change in status from initial assessment: No    New information that will affect transition plan: None    Anticipated Discharge plan:   Discussed in MDR. Status quo. on variance days.     Per patient choice, referrals made to:    [facility] rehab- resent per [facility]'s request -declined  [facility] rehab- pending  [facility]- pending  [facility]    Addendum:  Pt is accepted at [facility], CM met w/ the pt, she is happy w/ the dc plan.

## 2022-01-26 NOTE — RN Shift Note (Signed)
A&Ox4. Calm, cooperative with care. PRN ativan given for anxiety. VSS. 2L NC for comfort, baseline. Meds whole with liquids. Oral nystatin given for thrush, per pt improving. Denies pain. Swelling BLE, BUE. Strict I&O, hat in bathroom. No BM since 5/6 per pt, scheduled senokot given, declined additional bowel meds. Ambulates independently. Seizure precautions in place. PRN nicotine gum given per pt request for cravings. Resting comfortably in bed. Call light within reach. Safety maintained.

## 2022-01-26 NOTE — Progress Notes (Signed)
Inpatient Physical Therapy Treatment Note  S: "I do everthing myself. No one helps me with anything!"  O: Please see Vital Sign and Pain Assessment flowsheets for vitals and pain documentation.  Plan of care has been reviewed and updated as necessary.     01/26/22 1012   Language Information   Language of Care English   Rehab Discipline   Rehab Discipline PT   Mobility / Balance   Mobility / Balance Yes   Transfers   Transfer Yes   Transfer 1   Transfer From 1 Bed   Transfer Type 1 To and from   Transfer to 1 Stand   Technique 1 Sit to stand;Stand to sit   Transfer Device 1 None   Transfer Level of Assistance 1 Independent   Trials/Comments 1 x1   Gait   Gait Yes   Gait 1   Assistive Device 1 Rollator (Four wheel walker)   Pattern 1 WFL   Gait Assistance 1 Supervision   Distance (Ft) 1 200 Feet   Activity Tolerance   Activity Tolerance Tolerates < 10 min activity, no significant change in vital signs   Plan   Prognosis Good   PT Frequency 3x/wk       A: Received seated EOB, agreeable to treatment. Completes transfers independently without AD, chooses to ambulate with rollator for safety, supervision with rollator d/t feelings of SOB, sats 95% upon return, RN aware and to assess.   P: Continue per PT Care Plan.     Cheryl Zimmerman C. Fransisca Connors, PT, Lic # [id number]

## 2022-01-26 NOTE — Plan of Care (Signed)
A&OX4, RA, NC 2L at times d/t SOB. Reported feeling anxious, PRN Ativan 0.5mg  PO given with improvement. Pt reported having chronic bilateral knee and back pain 8/10, lidocaine patches applied and PRN Tylenol 650mg  given with improvement.   Remains on seizure precautions. Pt ambulated in hallway w/ front wheel walker, tolerated fairly well. Safety Maintained, Call bell in reach.

## 2022-01-26 NOTE — Progress Notes (Signed)
Attempted to see patient for PT follow up. Pt supine in bed, stating she feels the edema in UEs and LEs are increased today. Declines OOB mobility, stating she recently ambulated to the shower and independently showered herself. Now feeling SOB after all of this activity. Will f/u later, time permitting.    Micael Hampshire, PT, Lic # [id number]

## 2022-01-27 ENCOUNTER — Encounter (HOSPITAL_BASED_OUTPATIENT_CLINIC_OR_DEPARTMENT_OTHER): Payer: Self-pay

## 2022-01-27 DIAGNOSIS — Z9981 Dependence on supplemental oxygen: Secondary | ICD-10-CM

## 2022-01-27 DIAGNOSIS — J029 Acute pharyngitis, unspecified: Secondary | ICD-10-CM | POA: Diagnosis not present

## 2022-01-27 DIAGNOSIS — I5032 Chronic diastolic (congestive) heart failure: Secondary | ICD-10-CM | POA: Diagnosis not present

## 2022-01-27 DIAGNOSIS — R079 Chest pain, unspecified: Secondary | ICD-10-CM | POA: Diagnosis not present

## 2022-01-27 DIAGNOSIS — F4310 Post-traumatic stress disorder, unspecified: Secondary | ICD-10-CM

## 2022-01-27 DIAGNOSIS — E8770 Fluid overload, unspecified: Secondary | ICD-10-CM | POA: Diagnosis not present

## 2022-01-27 LAB — BASIC METABOLIC PANEL
ANION GAP: 11 mmol/L (ref 10–22)
BUN (UREA NITROGEN): 20 mg/dL — ABNORMAL HIGH (ref 7–18)
CALCIUM: 9.5 mg/dL (ref 8.5–10.5)
CARBON DIOXIDE: 29 mmol/L (ref 21–32)
CHLORIDE: 100 mmol/L (ref 98–107)
CREATININE: 0.8 mg/dL (ref 0.4–1.2)
ESTIMATED GLOMERULAR FILT RATE: >60 mL/min (ref >60)
Glucose Random: 155 mg/dL (ref 74–160)
POTASSIUM: 3.8 mmol/L (ref 3.5–5.1)
SODIUM: 140 mmol/L (ref 136–145)

## 2022-01-27 LAB — CBC WITH PLATELET
ABSOLUTE NRBC COUNT: 0 10*3/uL (ref 0.0–0.0)
HEMATOCRIT: 41 % (ref 34.1–44.9)
HEMOGLOBIN: 12.7 g/dL (ref 11.2–15.7)
MEAN CORP HGB CONC: 31 g/dL (ref 31.0–37.0)
MEAN CORPUSCULAR HGB: 30.4 pg (ref 26.0–34.0)
MEAN CORPUSCULAR VOL: 98.1 fl (ref 80.0–100.0)
MEAN PLATELET VOLUME: 10.5 fL (ref 8.7–12.5)
NRBC %: 0 % (ref 0.0–0.0)
PLATELET COUNT: 351 10*3/uL (ref 150–400)
RBC DISTRIBUTION WIDTH STD DEV: 51.8 fL — ABNORMAL HIGH (ref 35.1–46.3)
RED BLOOD CELL COUNT: 4.18 M/uL (ref 3.90–5.20)
WHITE BLOOD CELL COUNT: 6 10*3/uL (ref 4.0–11.0)

## 2022-01-27 LAB — PHOSPHORUS MAGNESIUM
MAGNESIUM: 2.1 mg/dL (ref 1.6–2.6)
PHOSPHORUS: 4.3 mg/dL (ref 2.5–4.9)

## 2022-01-27 MED ORDER — SPIRONOLACTONE 50 MG PO TABS
50.0000 mg | ORAL_TABLET | Freq: Every day | ORAL | 0 refills | Status: DC
Start: 2022-01-28 — End: 2022-04-12

## 2022-01-27 MED ORDER — QUETIAPINE FUMARATE 50 MG PO TABS
100.0000 mg | ORAL_TABLET | Freq: Every evening | ORAL | 0 refills | Status: DC
Start: 2022-01-27 — End: 2022-04-12

## 2022-01-27 MED ORDER — POLYETHYLENE GLYCOL 3350 17 G PO PACK
17.00 g | PACK | Freq: Every day | ORAL | 0 refills | Status: AC
Start: 2022-01-28 — End: 2022-02-27

## 2022-01-27 MED ORDER — LORAZEPAM 0.5 MG PO TABS
0.5000 mg | ORAL_TABLET | Freq: Two times a day (BID) | ORAL | 0 refills | Status: DC | PRN
Start: 2022-01-27 — End: 2022-01-27

## 2022-01-27 MED ORDER — BENZOCAINE-MENTHOL 15-3.6 MG MT LOZG
1.00 | LOZENGE | OROMUCOSAL | 0 refills | Status: AC | PRN
Start: 2022-01-27 — End: 2022-02-03

## 2022-01-27 MED ORDER — NYSTATIN 100000 UNIT/GM EX POWD
Freq: Two times a day (BID) | CUTANEOUS | 0 refills | Status: AC
Start: 2022-01-27 — End: 2022-02-26

## 2022-01-27 MED ORDER — BISACODYL 10 MG PR SUPP
10.00 mg | Freq: Every day | RECTAL | 0 refills | Status: AC | PRN
Start: 2022-01-27 — End: 2022-02-26

## 2022-01-27 MED ORDER — METHOCARBAMOL 500 MG PO TABS
500.00 mg | ORAL_TABLET | Freq: Three times a day (TID) | ORAL | 0 refills | Status: AC
Start: 2022-01-27 — End: 2022-02-26

## 2022-01-27 MED ORDER — SENNOSIDES 8.6 MG PO TABS
17.20 mg | ORAL_TABLET | Freq: Every evening | ORAL | 0 refills | Status: AC
Start: 2022-01-27 — End: 2022-02-26

## 2022-01-27 MED ORDER — BUPRENORPHINE HCL-NALOXONE HCL 8-2 MG SL SUBL
1.00 | SUBLINGUAL_TABLET | Freq: Three times a day (TID) | SUBLINGUAL | 0 refills | Status: AC
Start: 2022-01-27 — End: 2022-02-03

## 2022-01-27 MED ORDER — BUPRENORPHINE HCL-NALOXONE HCL 8-2 MG SL SUBL
1.0000 | SUBLINGUAL_TABLET | Freq: Three times a day (TID) | SUBLINGUAL | 0 refills | Status: DC
Start: 2022-01-27 — End: 2022-01-27

## 2022-01-27 MED ORDER — LORAZEPAM 0.5 MG PO TABS
0.50 mg | ORAL_TABLET | Freq: Two times a day (BID) | ORAL | 0 refills | Status: AC | PRN
Start: 2022-01-27 — End: 2022-02-03

## 2022-01-27 MED ORDER — NYSTATIN 100000 UNIT/ML MT SUSP
5.00 mL | Freq: Four times a day (QID) | OROMUCOSAL | 0 refills | Status: AC
Start: 2022-01-27 — End: 2022-01-28

## 2022-01-27 MED ORDER — TORSEMIDE 40 MG PO TABS
40.0000 mg | ORAL_TABLET | Freq: Every day | ORAL | 0 refills | Status: DC
Start: 2022-01-28 — End: 2022-04-09

## 2022-01-27 NOTE — Progress Notes (Signed)
01/27/2022 TTP Current admission, closing out.         Durenda Age, NCTTP  ?Health Educator  ?[facility]??  ?[employer]  ?[phone]  ?[email or web address]  ?

## 2022-01-27 NOTE — Progress Notes (Deleted)
PROGRESS NOTE  01/27/2022    PATIENT INFO: Cheryl Zimmerman, [age] year old female  DATE OF ADMISSION: 01/11/22    ROOM/BED LOCATION:  423/423-A  HOSPITAL DAY: 16    REVIEW OF OVERNIGHT EVENTS:  NAONE    SUBJECTIVE:  ***  No BM for >1 week    OBJECTIVE:  BP: (105-133)/(69-83)   Temp:  [97.4 F (36.3 C)-97.5 F (36.4 C)]   Pulse:  [71-79]   Resp:  [18-20]   SpO2:  [93 %-94 %]     INS/OUTS (PAST 24 HOURS)  I/O 24 Hrs:  In: 2040 [P.O.:2040]  Out: 2450 [Urine:2450]    PHYSICAL EXAM   GEN:   Sitting up in bed awake. Non-toxic.   HEENT:    Head atraumatic. EOMI.   CV:      RRR  PULM:      Normal WOB on RA. Lung sounds distant, no obvious wheezing or crackles  ABD:      Obese, NTND  EXT:      +2 pitting LE edema from ankles to upper thighs, b/l arms edematous   NEURO:     No focal deficits appreciated     Weight: 346 (5/6) --> 344 (5/7) --> 350 (5/9) --> 352 (5/10) --> 352    RECENT LABS  BMP:   Recent Labs     01/24/22  0920 01/25/22  0743 01/26/22  0750   NA 137 138 138   K 3.9 3.9 4.0   CL 100 99 99   CO2 26 31 29    BUN 19* 19* 19*   CREAT 0.8 0.7 0.8   GLUCOSER 144 97 92     Ca, Mg, Phos:   Recent Labs     01/24/22  0920 01/25/22  0743 01/26/22  0750   CA 9.7 9.6 9.8   MG 2.1 2.2 2.2   PHOS 3.5 3.8 3.6     CBC:   Recent Labs     01/25/22  0743 01/26/22  0750   WBC 6.3 6.5   HCT 40.3 40.7   HGB 12.9 13.1   PLTA 357 361   RBC 4.21 4.25       MICROBIOLOGY REVIEW  No new microbiology results to review.    IMAGING AND OTHER STUDIES  No new imaging.     MEDICATIONS  See list in EPIC    ASSESSMENT & PLAN   61 F with CHF, A. Fib, COPD, bronchial asthma, on 2L home O2, seizure disorder on keppra, BPD, Schizoaffective disorder, polysubstance use on buprenorphine maintenance, PTSD, multiple SA, active cocaine use, obesity presented with chest pain and wasadmitted for SI, hypoK 2.6,substernal chest pressure,SOB consistent with CHF exacerbation. Iinitially on active SI precautions, now no longer on active SI  precautions and does not meet criteria for inpatient psych. C/c/b hypokalemia and difficulty with safe discharge planning.      #HFpEF (EF 70%)  #Hypervolemia   Patient found to be fluid overloaded on admission. On torsemide 120 mg BID PTA, though unclear patient's compliance. Patient's volume status inpatient  has been difficult to determine due to patient's body habitus, baseline O2 requirement, and likely chronic leg edema. Strict I/Os ordered as well as daily standing weights but I/Os have been difficult to accurately record and patient intermittently refusing standing weights. Patient was continued on home diuretic, developed AKI, and then her diuretic titration was complicated by ongoing hypokalemia. Discontinued home metolazone 5/1. Hypokalemia since resolved and she was started back on Torsemide at  low dose 10 which has been uptitrated to 20 QD.   []  TI: Consider R heart cath outpatient   - torsemide currently 20 QD, will double the dose given her weight gain and increased swelling to 40 QD  - spironolactone 25 QD, will increase to 50 QD  - Difficult volume exam  - Daily standing weight, strict I/O (will emphasize on MDR)    #Sore throat   No exudates, some subtle generalized erythema of posterior oropharynx. No lymphadenopathy. No cough. Uses inhalers and was not rinsing mouth out after for a few days, says it feels like her prior thrush. COVID neg.   - Continue lozenges  - Continue nystatin swish     #Chest pain - returned   Initially presented with chest pain on admission. Trops mildly elevated but stable. EKG showed ST depressions seen on prior but no reciprocal changes. Chest pain seemed to be exertional and/pleuritic, differential stable angina vs type 2 NSTEMI from CHF exacerbation, pressure from fluid overload, anxiety. She was monitored on tele here with no acute events. Today, she reports return of chest pain without hypoxia, using oxygen for comfort.  []  TI: consider ischemic eval outpatient   -  Repeat EKG (patient refused)  - Diuresis plan as above    #SI  #Polysubstance Use   #Housing Insecurity  Presented with active SI without plan. High risk due to multiplerecent hospitalizations 2/2 multiplesuicideattempts. Has been verbally aggressive to staff and has refused labs or medication at times. Initially on 1:1 sitter, psych was consulted and eventually patient no longer needed a sitter and was safe from a psychiatric perspective for discharge.   - SW consulted, appreciate recs  - Psych consulted, appreciate recs:  Does not meet section 12 criteria. No psychiatric contraindication to discharge. May increase seroquel to 100 nightly.       #Hypokalemia - resolved   #Hypochloremiac Metabolic Alkalosis - resolved   Patient has had multiple electrolyte abnormalities since inial hospitalization. Patient has required aggressive potassium repletion in particular, requiring over 100 mEq of potassium most days. Hypokalemia and metabolic alkalosis initially thought to be due to patient's agressive diuresis plan. Though urine Cl very elevated in 50s, which would not be expected with diuresis. Patient has no signs of hyperaldosteronism, has no diarrhea or vommiting. If diuretics are not the main cause of electrolyte imbalances, differential includes possibly Gitelman syndrome (which can appear in adulthood) as patient has muscle cramps and spasms, polyuria, metabolic alkalosis, low Cl and severe hypokalemia though Mg levels have been normal and K on chart review has been normal expect for the past month and would assume that for genetic condition that would be slow increase in K. Her Torsemide was held while repleting K, and resumed/titrated once K was normal.   - CTM BMP    #AKI - resolved   Creat baseline ~0.8. Developed AKI, likely iso overdiuresis. Cr today 0.8.  - CTM     #Hyponatremia - resolved   Hyponatremic to 131 on admission and continues to be hyponatremic throughou hospitalization, though levels  fluctuating. Urine Osm 89 and Urine Na <20 then 37. Hyponatremia etiology likely has changed with fluctuating volume status. Likely due to low effective circulating volume due to CHF exacerbation but was later caused by aggressive diuretics. Unclear why urine osm so low as patient did not seem to have primary polydipsia.   - CTM    #Chronic pain  #Somnolence   Pt with recurrence of burning shooting pain from hips/lower  back to anterior thighs, to her shins. Has known spinal stenosis with sciatica. On many sedating medications for pain, which have been adjusted due to somnolence.   - continue clotrimazole 1% BID, diclofenac 1% 2g TID, Lidocane patch 3 qam,  - Continue methocarbamol 750 mg TID  - Continue Lorazepam 0.5 mg TID PRN   - Continue Gabapentin 500 mg BID    #Constipation  Chronic constipation. Didn't have bowel movement for about 1-2 weeks on initial presentation. On miralax at home. Senna has helped in previous hospitalizations.   - Senna and Miralax scheduled   - PRN enema     #Isolated Elevated AlkPhos   Elevated on admission. No RUQ pain. Other LFTs WNL.   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 On 2L NC at home, but was only recently started on home O2 within the past month . Supplemental O2 has been titrated iso of both baseline COPD and O2 requirement on top of COPD exacerbation. Continues to beafebrile, withouttachypnea, withoutuse of accessory muscles, andno coughing. O2   - Contine home budesonide-formoterol, albutero q6h prn, fluticasone prn  - Continue NC 2 L overnight PRN. No daytime  O2 need.   [ ]  TI: Patient expresses concern about a dysfunction with her home O2 device. Discussed with RT -- because this is a home device from a different company, an RT from that specific company will need to come to her outpatient to assist with this.   [ ]   TI: Needs a sleep study given overnight O2 need     #Polycythemia - Resolved  Polycythemiaresolving, likely due toreturn ofintravascular volume.  -continue to monitor    #Leukocytosis - Resolved  Normalized.A febrile and hemodynamically stable.  -continue to monitor    Telemetry: Yes  Foley: No  FEN: diabetes diet, PO  Prophylaxis: heparin 5,000 units  Medication Reconciliation: Done    Code Status  Full Code      Dispo: medically clear pending stable on diuretic titration and safe dispo plan       Vena RuaHolly Seneca Hoback, MD  PGY-1  [facility]     Discussed patient case and plan with attending, Dr. Landis Gandyardoso

## 2022-01-27 NOTE — Plan of Care (Signed)
Pt medically cleared for d/c. All paperwork and education provided. RN to RN report given to STR. All belongings in personal possession. Pt d/c off floor safely via EMS.     Problem: Activity:  Goal: Mobility will be supported throughout the hospitalization,  Outcome: Adequate for Discharge     Problem: Cognitive:  Goal: Caregivers and patients knowledge of risk factors and measures for prevention of condition will be supported throughout the hospitalization  Outcome: Adequate for Discharge     Problem: Safety:  Goal: Will remain free from falls throughout the hospitalization,  Outcome: Adequate for Discharge     Problem: Knowledge Deficit  Goal: Patient/S.O. demonstrates understanding of disease process, treatment plan, medications, and discharge instructions.  Description: Complete learning assessment and assess knowledge base  Outcome: Adequate for Discharge     Problem: Inadequate Coping  Goal: Demonstrates ability to cope effectively  Description: Patient is able to verbalize feelings related to emotional state.  Outcome: Adequate for Discharge  Goal: Verbalizes adaptive coping mechanisms  Description: Able to verbalize adaptive coping mechanisms such as physical activity, distraction, and deep breathing exercises.  Outcome: Adequate for Discharge     Problem: Potential for Suicide  Goal: Remain free from self harm  Description: Assess suicide risk on admission and per hospital policy.  Assess and monitor patient's mood and behaviors which may signal an increase in suicidal potential, statements such as "I wish I were dead", acting recklessly, giving away personal possessions, or suicide notes.  Assess patient for symptoms of post-traumatic stress disorder.  Implement suicide precautions per hospital policy.  Collaborate with interdisciplinary team and initiate plan and interventions as ordered.  Outcome: Adequate for Discharge

## 2022-01-27 NOTE — Page 1 Discharge - Orders (Addendum)
PATIENT CARE REFERRAL FORM    Patient Demographics:           Name MRN Sex Birthdate Social Security   Cheryl Zimmerman [id number] Female  09/08/1971  (51 year old) [id number]   Address Home Phone Insurance ID Marital Status [place] [id number] [phone] [facility] /  /  /   [id number] Divorced None   PCP Admission Date Discharge Date Attending Provider Code Status   Gunnar Fusiwyer, Ian, MD 01/11/22      01/27/22 Anastasia Fiedlerardoso, Renata G., MD Full Code     Patient is expected < 30 days at a short term rehab facility.      Primary Contacts:            Extended Emergency Contact Information  Primary Emergency Contact: NONE,NONE  Home Phone: [phone]  Relation: Unknown    Healthcare Proxy: Data Unavailable  Phone Number: Data Unavailable    Discharge:             Discharge to:  [facility] [place]  Has discharge transportation arranged?  Yes  Transportation at discharge:  Ambulance     Contact number after discharge:   [phone] (patient)    Interpreter requested: No  Family notified of disposition:  No       Diagnosis:             Primary diagnosis: Congestive heart failure exacerbation         Patient Active Problem List:     Dyspnea on minimal exertion     Suicidal behavior with attempted self-injury ([name])     Tobacco use disorder     Systolic congestive heart failure ([name])     Seizure disorder ([name])     Schizoaffective disorder, depressive type ([name])     Suicidal ideation     Afib ([name])     Alcohol use disorder     Borderline personality disorder ([name])     Chronic pain disorder     COPD with hypoxia ([name])     Morbid obesity ([name])     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)     PTSD (post-traumatic stress disorder)     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 Seems to tolerate             inhalational and epidural steroid.   Nutritional supplem*       Hydroxyzine             Nausea Only     Physician Order's: (Include specific orders for Diet, Labs, or Tests)       Page 1 Instructions      Labs:  - Please check BMP in 1-3 days     Nursing:  - Please  monitor daily standing weight before eating, notify MD if gains >3 pounds in 1 day or >5 pounds in 1 week  - Please monitor fluid status, if increased extremity swelling or trouble breathing, notify MD  - Please check vital signs: BP, pulse, O2 sat, RR  - Encourage medication compliance   - Cardiac diet     PT:  - Please continue PT assessment and treatment as tolerated  - Per inpatient PT evaluation 5/10:  Gait 1   Assistive Device 1 Rollator (Four wheel walker)   Pattern 1 WFL   Gait Assistance 1 Supervision   Distance (Ft) 1 200 Feet   Activity Tolerance   Activity Tolerance Tolerates < 10 min activity, no significant change in vital signs   Plan   Prognosis Good   PT Frequency 3x/wk                              Current Discharge Medication List    START taking these medications    benzocaine-menthol (CEPACOL) 15-3.6 MG LOZG  Place 1 lozenge inside cheek every 2 (two) hours as needed  for up to 7 days  Qty: 20 lozenge Refills: 0    bisacodyl (DULCOLAX) 10 MG SUPP Suppository  Place 1 suppository rectally daily as needed for Constipation  Qty: 1 suppository Refills: 0    nystatin (MYCOSTATIN) powder  Apply topically 2 (two) times daily  Qty: 15 g Refills: 0    polyethylene glycol (GLYCOLAX/MIRALAX) 17 g packet  Take 1 packet by mouth in the morning.  Qty: 30 packet Refills: 0    sennosides (SENOKOT) 8.6 MG tablet  Take 2 tablets by  mouth nightly  Qty: 60 tablet Refills: 0    spironolactone (ALDACTONE) 50 MG tablet  Take 1 tablet by mouth in the morning.  Qty: 30 tablet Refills: 0    nystatin (MYCOSTATIN) 100000 UNIT/ML suspension  Take 5 mLs by mouth in the morning and 5 mLs at noon and 5 mLs in the evening and 5 mLs before bedtime. Do all this for 1 day.  Qty: 20 mL Refills: 0    LORazepam (ATIVAN) 0.5 MG tablet  Take 1 tablet by mouth 2 (two) times daily as needed for Anxiety  for up to 7 days  Qty: 14 tablet Refills: 0      CONTINUE these medications which have CHANGED    methocarbamol (ROBAXIN) 500 MG tablet  Take 1 tablet by mouth in the morning and 1 tablet at noon and 1 tablet before bedtime.  Qty: 90 tablet Refills: 0    torsemide 40 MG TABS  Take 40 mg by mouth in the morning.  Qty: 30 tablet Refills: 0    QUEtiapine (SEROQUEL) 50 MG tablet  Take 2 tablets by mouth at bedtime  Qty: 60 tablet Refills: 0    buprenorphine-naloxone (SUBOXONE) 8-2 MG sublingual tablet  Place 1 tablet under the tongue in the morning and 1 tablet at noon and 1 tablet before bedtime. Max Daily Amount: 3 tablets. Do all this for 7 days.  Qty: 21 tablet Refills: 0      CONTINUE these medications which have NOT CHANGED    albuterol HFA 108 (90 Base) MCG/ACT inhaler  Inhale 2 puffs into the lungs every 6 (six) hours as needed for Wheezing  Qty: 1 each Refills: 0  Comments: OK to switch to covered brand alternative    atorvastatin (LIPITOR) 80 MG tablet  Take  1 tablet by mouth in the morning.  Qty: 30 tablet Refills: 0    azithromycin (ZITHROMAX) 500 MG tablet  Take 1 tablet by mouth 3 (three) times a week  Qty: 12 tablet Refills: 0    clotrimazole (LOTRIMIN AF) 1 % cream  Apply topically 2 (two) times daily  Qty: 15 g Refills: 0    diclofenac (VOLTAREN) 1 % GEL Gel  Apply 2 g topically 3 (three) times daily as needed (knee pain)  Qty: 100 g Refills: 0    FLUoxetine (PROZAC) 20 MG capsule  Take 1 capsule by mouth in the morning.  Qty: 30 capsule Refills:  0  Comments: Ok to dispense capsules or tablets.    fluticasone (FLONASE) 50 MCG/ACT nasal spray  1 spray by Each Nostril route 2 (two) times daily as needed (congestion)  Qty: 1 each Refills: 0    gabapentin (NEURONTIN) 300 MG capsule  Take 2 capsules by mouth in the morning and 2 capsules at noon and 2 capsules before bedtime.  Qty: 180 capsule Refills: 0    levETIRAcetam (KEPPRA) 750 MG tablet  Take 1 tablet by mouth in the morning and 1 tablet before bedtime.  Qty: 60 tablet Refills: 0    lidocaine (SALONPAS) 4 % PTCH  Apply 1 patch topically in the morning.  Qty: 30 patch Refills: 0    Loratadine 10 MG CAPS  Take 10 mg by mouth in the morning.  Qty: 30 capsule Refills: 0    loxapine (LOXITANE) 25 MG capsule  Take 1 capsule by mouth in the morning and 1 capsule before bedtime.  Qty: 60 capsule Refills: 0    nicotine (NICODERM CQ) 21 MG/24HR  Place 1 patch onto the skin in the morning.  Qty: 30 patch Refills: 0    nicotine polacrilex (NICORETTE) 4 MG gum  Take 1 each by mouth every 2 (two) hours as needed for Craving (Nicotine)  Qty: 120 each Refills: 0    OXcarbazepine (TRILEPTAL) 150 MG tablet  Take 1 tablet by mouth in the morning and 1 tablet before bedtime.  Qty: 60 tablet Refills: 0    pantoprazole (PROTONIX) 40 MG tablet  Take 1 tablet by mouth in the morning.  Qty: 30 tablet Refills: 0    prazosin (MINIPRESS) 1 MG capsule  Take 3 capsules by mouth nightly  Qty: 90 capsule Refills: 0    aspirin 81 MG chewable tablet  Take 1 tablet by mouth in the morning.  Qty: 30 tablet Refills: 0    budesonide-formoterol (SYMBICORT) 160-4.5 MCG/ACT inhaler  Inhale 2 puffs into the lungs in the morning and 2 puffs before bedtime.  Qty: 1 each Refills: 0    levothyroxine (SYNTHROID) 25 MCG tablet  Take 1 tablet by mouth every morning before breakfast  Qty: 30 tablet Refills: 0    !! OTHER MEDICATION  Device: Inogen 3  Oxygen Flow Rate: 2-4 LPM  Titration Goal: SpO2 between 90% and 95%  Instructions:  1. Begin with 2 LPM  and monitor oxygen saturation using a pulse oximeter.  2. If oxygen saturation is below 90%, increase the flow rate by 0.5 LPM every 15-30 minutes until the desired saturation level of 90%-95% is achieved.  3. If oxygen saturation is above 95%, decrease the flow rate by 0.5 LPM every 15-30 minutes until the desired saturation level of 90%-95% is achieved.  4. Once the desired saturation level is achieved, maintain the flow rate at that level.    DX: COPD  with Hypoxia, Requires continuous supplemental oxygen  ICD10: Code: J44.9 and Z99.81    SIGNED Georgianne Fick, CNP, 01/05/2022    Nurse Practitioner   Hospitalist Division  [facility]  [address]  Phone:[phone] (fastest) or [phone]   Email:[email or web address]     NPI: [id number]  Qty: 1 Device Refills: 0    !! OTHER MEDICATION  Wheelchair: Electric  For use as directed.    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 - Potential duplicate medications found. Please discuss with provider.      STOP taking these medications    metolazone (ZAROXOLYN) 5 MG tablet  Comments:  Reason for Stopping:          Discharge Supplies:   As above         Follow-up Information     Follow up With Specialties Details Why Contact Info    [name], MD Internal Medicine Follow up on 01/31/2022 :10PM [address]  [facility]  [place] [address]  [phone]  [phone]          Current and Future Appointments at [facility] (90 Days) 01/27/2022 - 04/27/2022      Date Visit Type Length Department Provider     01/31/2022  2:10 PM HOSPITAL FOLLOW-UP 40 min [facility] [address] [[id number]] [name], MD    Patient Instructions:                        Physical Therapy:              Weight Bearing Status LRE: Initial Evaluation  Weight Bearing Status LLE: Full weight bearing    Frequency: 3x/wk  Recommendation: Other (Comment) (respite house)     Recommended Equipment: Equipment Recommended: Rollator (Four wheel walker)  Patient Stated Goals: "I want to get better with PT. I don't want to go to a nursing home"  Prognosis: Prognosis: Good    Home Health Services:           Home Health Services:  As determined after STR    CERTIFICATION (When Applicable):         I certify that the above named patient is: Requires skilled nursing care on a continuing basis for any of the conditions for which he/she received care during this hospitalization.    Who will follow this patient after discharge: [name], MD or next available PCP      Electronically signed by:  Vena Rua, MD

## 2022-01-27 NOTE — Discharge Inst - Page 1 Instructions (Addendum)
Labs:  - Please check BMP in 1-3 days     Nursing:  - Please monitor daily standing weight before eating, notify MD if gains >3 pounds in 1 day or >5 pounds in 1 week  - Please monitor fluid status, if increased extremity swelling or trouble breathing, notify MD  - Please check vital signs: BP, pulse, O2 sat, RR  - Encourage medication compliance   - Cardiac diet     PT:  - Please continue PT assessment and treatment as tolerated  - Per inpatient PT evaluation 5/10:  Gait 1   Assistive Device 1 Rollator (Four wheel walker)   Pattern 1 WFL   Gait Assistance 1 Supervision   Distance (Ft) 1 200 Feet   Activity Tolerance   Activity Tolerance Tolerates < 10 min activity, no significant change in vital signs   Plan   Prognosis Good   PT Frequency 3x/wk

## 2022-01-27 NOTE — Progress Notes (Signed)
Pt discussed during MDR.     SW provided pt w/ clothing at d/c. Pt has concerns re: a package that is set to be delivered to the hospital between 01/30/22-02/03/22. Pt is planning on reaching out to company to try and change mailing address.     SW notified Leeroy Bock [phone] at [place] of pt's discharge to [place].     SW will continue to follow as needed.

## 2022-01-27 NOTE — Progress Notes (Signed)
Discussed in MDR. Pt is medically cleared for dc to [facility]. Ambulance will be booked for the nxt available schedule. CM met w/ pt earlier, she is happy w/ the dc plan. Hand off number provided to RN.       01/27/22 1157   Discharge Reviewed   Reviewed for discharge Yes   Facility Bed confirmed Yes   UR Completed? Yes   Final Discharge Plan    Was this a  3 day waiver? No   Discharge disposition [facility] Other SNF-Rehab (specify) [phone])   Other SNF-Rehab (Specify) [facility]   Transportation at Discharge Ambulance   Patient [place] Skilled Assessment;Teaching;Therapy   Skilled Assessment Therapy;Teaching;Skilled Assessment   Equipment Recommended Rollator (Four wheel walker)   IM from medicare delivered N/A   Financial Assistance Pending No   Care Giver   Do you have a caregiver?  No

## 2022-01-27 NOTE — Progress Notes (Signed)
SOCIAL WORK - REASSESSMENT       Case Reviewed with: Patient, Provider and Outpatient Provider    Current Level Of Care: [facility]    Insurance Concerns: N/A    Initial social worker Request: Homeless/Housing Insecure      Change In Status From Initial Assessment: N/A     New Information That Will Affect Transition Plan: N/A    Anticipated D/C Plan: Pt will go to [facility]     Per Patient Choice, Referrals Made To: N/A    Barriers To Plan: N/A    Patient/Responsible Party Understanding of Final Plan: Yes, Able to repeat back plan with understanding

## 2022-01-27 NOTE — Progress Notes (Signed)
S:  I am leaving today! wahoo.   O: Please see Vital Sign and Pain Assessment flowsheets for vitals and pain documentation.  Plan of care has been reviewed.   01/27/22 1102   Language Information   Language of Care English   Rehab Discipline   Rehab Discipline PT   Weight Bearing Status   RLE FWB   LLE FWB   RUE FWB   LUE FWB   Mobility / Balance   Mobility / Balance Yes   Bed Mobility   Supine to Sit Independent   Rolling Independent   Transfers   Transfer Yes   Transfer 1   Transfer From 1 Bed   Transfer Type 1 To and from   Transfer to 1 Stand   Technique 1 Sit to stand;Stand to sit   Transfer Device 1 None   Transfer Level of Assistance 1 Independent   Gait   Gait Yes   Gait 1   Assistive Device 1 None   Pattern 1 R Decreased heel strike;L Decreased heel strike;Decreased stride length   Gait Assistance 1 Independent   Distance (Ft) 1 250 Feet   Stair Management Technique 1 One rail L;Alternating pattern   Stair Management Assistance 1 Supervision   Number of Stairs 1 8   Activity Tolerance   Activity Tolerance Tolerates 10 - 20 min treatment with multiple rests   Balance   Sitting - Static Supports self independently with both upper extremities;Feet supported   Sitting - Dynamic Bilateral upper extremity supported;Feet supported;Moves/returns truncal midpoint greater than 2 inches in all planes   Standing - Static Bilateral upper extremity supported;Able to maintain 60 sec   Standing - Dynamic Forward lean;Reaching for objects   Ther Exercise   Therapeutic Exercise? No   Coordination   Gross Motor Performance Observation Stiff;Tense   Safety Devices   Type of Devices Call bell in place   Plan   Prognosis Good   PT Frequency 3x/wk   Recommendation   Equipment Recommended Rollator (Four wheel walker)         A:  Pt doing well with transfers & mobility.  Independently ambulates without device. Ambulating 250'  with lateral sway. Completed 8 stairs with supervision. Has increased Left knee pain with activity.   Seated in hallway with all needs.  P: Continue per PT Care Plan.   Harlin Rain, PTA, Lic # [id number]

## 2022-01-31 ENCOUNTER — Ambulatory Visit (HOSPITAL_BASED_OUTPATIENT_CLINIC_OR_DEPARTMENT_OTHER): Payer: Self-pay

## 2022-01-31 ENCOUNTER — Telehealth (HOSPITAL_BASED_OUTPATIENT_CLINIC_OR_DEPARTMENT_OTHER): Payer: Self-pay

## 2022-01-31 NOTE — Progress Notes (Deleted)
RESIDENT PRIMARY CARE NOTE       SUBJECTIVE  Cheryl Zimmerman is a 51 year old English-speaking female here for:    Chief concern(s): No chief complaint on file.     #Pain    #Elevated BMI  -BMI >50    #OSA  #OHS    #HFpEF  -etiology unknown, possibly cocaine use contributory  -mild concentric hypertrophy, EF 70% without RWMA  -RV has remained not well imaged on numerous echos, no right heart cath  -d/c regimen torsemide 40mg , spironolactone 50  -d/c weight  5/6 346; weight 5/10 352; on admission 3/  -pt could benefit from RHC as volume status/sx assessment has remained challenging, though almost certainly has unmanaged OSA/OHS. Would be ideal to first optimize these and then     #OUD  -on bup 8mg  daily  -per chart preview h/o of IV opioid use, though pt reports no. Also has chronic pain.   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       OBJECTIVE  There were no vitals taken for this  visit.  Most Recent Weight Reading(s)  01/27/22 : (!) 159.9 kg (352 lb 9 oz)  01/01/22 : (!) 152.1 kg (335 lb 6.4 oz)  12/21/21 : (!) 159.9 kg (352 lb 8 oz)  12/13/21 : (!) 165.8 kg (365 lb 9.6 oz)  12/02/21 : (!) 145.2 kg (320 lb)                Most Recent BP Reading(s)  01/27/22 : 112/81  01/06/22 : 111/69  12/22/21 : 138/89  12/17/21 : 126/87  12/05/21 : 111/72      Physical Exam      I personally reviewed relevant labs, imaging, and studies in EpicCare.       ASSESSMENT AND PLAN  Cheryl Zimmerman is a 51 year old female who presents with ***     (Associate diagnoses for visit then pull in .DIAG to document A/P or .PROBDIAG if problem-based charting)    Follow up in approximately ***    {Document time spent to support E&M coding:12984}  ____________  Discussed with Attending Dr. [name], MD, 01/31/2022    Resident Physician     ***Update HM, refresh all

## 2022-01-31 NOTE — Telephone Encounter (Signed)
Called pt, no answer, VM full. Will schedule pt for tentative f/u in July, though hopefully can be seen sooner

## 2022-02-01 ENCOUNTER — Telehealth (HOSPITAL_BASED_OUTPATIENT_CLINIC_OR_DEPARTMENT_OTHER): Payer: Self-pay

## 2022-02-01 NOTE — Telephone Encounter (Signed)
-----   Message from [name], MD sent at 01/31/2022  2:33 PM EDT -----  Regarding: in person visit  Hi,    Just wondering if this patient could please be scheduled for in-person visit with Annmarie or one of the other physician assistants. She has f/u with me now scheduled for 7/17 but was supposed to have hospital f/u with me today. She should be seen ASAP ideally. Thank you very much in advance.    Sincerely,  [name]  PGY1 [facility] IM Residency

## 2022-02-01 NOTE — Telephone Encounter (Signed)
Planned Care Outreach  Call made to Cheryl Zimmerman to?schedule an appointment?for hospital f/u . No interpreter needed.   Patient (self) answered at [phone] ; patient chose to hold off on scheduling appointment because they are currently at rehab and would not be able to make any appts at the time until they are discharged. Patient will give call back they get discharged from Rehab .  I have completed the following communication and reminder steps: Patient notified by telephone  Marney Doctor, 02/01/2022    On behalf of PCP:?[name], Loa Socks, MD?

## 2022-02-03 DIAGNOSIS — R911 Solitary pulmonary nodule: Secondary | ICD-10-CM | POA: Insufficient documentation

## 2022-03-23 ENCOUNTER — Other Ambulatory Visit: Payer: Self-pay

## 2022-03-23 ENCOUNTER — Encounter (HOSPITAL_BASED_OUTPATIENT_CLINIC_OR_DEPARTMENT_OTHER): Payer: Self-pay

## 2022-03-23 ENCOUNTER — Emergency Department (HOSPITAL_BASED_OUTPATIENT_CLINIC_OR_DEPARTMENT_OTHER): Payer: No Typology Code available for payment source

## 2022-03-23 ENCOUNTER — Emergency Department (EMERGENCY_DEPARTMENT_HOSPITAL)
Admission: EM | Admit: 2022-03-23 | Discharge: 2022-03-24 | Disposition: A | Discharge status: Other Acute Inpatient Hospital | Payer: No Typology Code available for payment source | Source: Emergency Department | Attending: Emergency Medicine | Admitting: Emergency Medicine

## 2022-03-23 DIAGNOSIS — Z008 Encounter for other general examination: Secondary | ICD-10-CM

## 2022-03-23 DIAGNOSIS — R45851 Suicidal ideations: Secondary | ICD-10-CM

## 2022-03-23 DIAGNOSIS — R112 Nausea with vomiting, unspecified: Secondary | ICD-10-CM | POA: Insufficient documentation

## 2022-03-23 DIAGNOSIS — J811 Chronic pulmonary edema: Secondary | ICD-10-CM | POA: Diagnosis not present

## 2022-03-23 DIAGNOSIS — R45850 Homicidal ideations: Secondary | ICD-10-CM

## 2022-03-23 DIAGNOSIS — F250 Schizoaffective disorder, bipolar type: Secondary | ICD-10-CM | POA: Insufficient documentation

## 2022-03-23 LAB — CBC, PLATELET & DIFFERENTIAL
ABSOLUTE BASO COUNT: 0.1 10*3/uL (ref 0.0–0.1)
ABSOLUTE EOSINOPHIL COUNT: 0 10*3/uL (ref 0.0–0.8)
ABSOLUTE IMM GRAN COUNT: 0.1 10*3/uL (ref 0.00–0.10)
ABSOLUTE LYMPH COUNT: 3 10*3/uL (ref 0.6–5.9)
ABSOLUTE MONO COUNT: 0.6 10*3/uL (ref 0.2–1.4)
ABSOLUTE NEUTROPHIL COUNT: 8.2 10*3/uL (ref 1.6–8.3)
ABSOLUTE NRBC COUNT: 0 10*3/uL (ref 0.0–0.0)
BASOPHIL %: 0.4 % (ref 0.0–1.2)
EOSINOPHIL %: 0.3 % (ref 0.0–7.0)
HEMATOCRIT: 46.2 % — ABNORMAL HIGH (ref 34.1–44.9)
HEMOGLOBIN: 15.5 g/dL (ref 11.2–15.7)
IMMATURE GRANULOCYTE %: 0.8 % (ref 0.0–1.0)
LYMPHOCYTE %: 24.9 % (ref 15.0–54.0)
MEAN CORP HGB CONC: 33.5 g/dL (ref 31.0–37.0)
MEAN CORPUSCULAR HGB: 30.7 pg (ref 26.0–34.0)
MEAN CORPUSCULAR VOL: 91.5 fl (ref 80.0–100.0)
MEAN PLATELET VOLUME: 11 fL (ref 8.7–12.5)
MONOCYTE %: 4.7 % (ref 4.0–13.0)
NEUTROPHIL %: 68.9 % (ref 40.0–75.0)
NRBC %: 0 % (ref 0.0–0.0)
PLATELET COUNT: 383 10*3/uL (ref 150–400)
RBC DISTRIBUTION WIDTH STD DEV: 47 fL — ABNORMAL HIGH (ref 35.1–46.3)
RED BLOOD CELL COUNT: 5.05 M/uL (ref 3.90–5.20)
WHITE BLOOD CELL COUNT: 11.9 10*3/uL — ABNORMAL HIGH (ref 4.0–11.0)

## 2022-03-23 LAB — URINE DRUG SCREEN
AMPHETAMINES URINE: NEGATIVE ng/mL
BENZODIAZEPINES URINE: NEGATIVE ng/mL
BUPRENORPHINE SCREEN URINE: POSITIVE ng/mL — AB
CANNABINOIDS URINE: NEGATIVE ng/mL
COCAINE METABOLITES URINE: POSITIVE ng/mL — AB
ETHANOL URINE: POSITIVE mg/dL — AB
FENTANYL URINE: NEGATIVE ng/mL
METHADONE URINE: NEGATIVE ng/mL
OPIATES URINE: NEGATIVE ng/mL
OXYCOD SCRN URINE: NEGATIVE ng/mL
SPEC VALIDITY SPECIFIC GRAVITY: 1.02 (ref 1.003–1.035)
SPECIMEN VALIDITY URINE CREAT: 26 mg/dL (ref >20)
SPECIMEN VALIDITY URINE PH: 6 (ref 5.0–8.5)

## 2022-03-23 LAB — COVID-19 INPATIENT: COVID-19 INPATIENT: NEGATIVE

## 2022-03-23 MED ORDER — DOXEPIN HCL 25 MG PO CAPS: 25.0000 mg | ORAL_CAPSULE | Freq: Once | ORAL | Status: AC

## 2022-03-23 MED ORDER — TORSEMIDE 20 MG PO TABS
60.0000 mg | ORAL_TABLET | Freq: Every day | ORAL | Status: DC
Start: 2022-03-23 — End: 2022-03-24
  Administered 2022-03-23 – 2022-03-24 (×2): 60 mg via ORAL
  Filled 2022-03-23 (×2): qty 3

## 2022-03-23 MED ORDER — BACLOFEN 10 MG PO TABS
10.0000 mg | ORAL_TABLET | Freq: Two times a day (BID) | ORAL | Status: DC
  Administered 2022-03-23 – 2022-03-24 (×4): 10 mg via ORAL
  Filled 2022-03-23 (×4): qty 1

## 2022-03-23 MED ORDER — OXCARBAZEPINE 150 MG PO TABS
300.0000 mg | ORAL_TABLET | Freq: Two times a day (BID) | ORAL | Status: DC
  Administered 2022-03-23 – 2022-03-24 (×4): 300 mg via ORAL
  Filled 2022-03-23 (×4): qty 2

## 2022-03-23 MED ORDER — PANTOPRAZOLE SODIUM 40 MG PO TBEC
40.0000 mg | DELAYED_RELEASE_TABLET | Freq: Every day | ORAL | Status: DC
Start: 2022-03-23 — End: 2022-03-24
  Administered 2022-03-23 – 2022-03-24 (×2): 40 mg via ORAL
  Filled 2022-03-23 (×2): qty 1

## 2022-03-23 MED ORDER — DIAZEPAM 10 MG PO TABS
10.0000 mg | ORAL_TABLET | ORAL | Status: DC | PRN
Start: 2022-03-23 — End: 2022-03-24
  Administered 2022-03-23 (×3): 10 mg via ORAL
  Filled 2022-03-23 (×3): qty 1

## 2022-03-23 MED ORDER — DIAZEPAM 5 MG PO TABS
5.0000 mg | ORAL_TABLET | ORAL | Status: DC | PRN
Start: 2022-03-23 — End: 2022-03-24
  Administered 2022-03-24: 5 mg via ORAL
  Filled 2022-03-23: qty 1

## 2022-03-23 MED ORDER — NICOTINE POLACRILEX 2 MG MT GUM
2.0000 mg | CHEWING_GUM | OROMUCOSAL | Status: DC | PRN
Start: 2022-03-23 — End: 2022-03-24
  Administered 2022-03-23 – 2022-03-24 (×7): 2 mg via ORAL
  Filled 2022-03-23 (×7): qty 1

## 2022-03-23 MED ORDER — METHOCARBAMOL 500 MG PO TABS
500.0000 mg | ORAL_TABLET | Freq: Three times a day (TID) | ORAL | Status: DC
Start: 2022-03-23 — End: 2022-03-24
  Administered 2022-03-23 – 2022-03-24 (×5): 500 mg via ORAL
  Filled 2022-03-23 (×5): qty 1

## 2022-03-23 MED ORDER — DIAZEPAM 10 MG PO TABS
20.0000 mg | ORAL_TABLET | Freq: Once | ORAL | Status: AC | PRN
Start: 2022-03-23 — End: 2022-03-23

## 2022-03-23 MED ORDER — LEVETIRACETAM 500 MG PO TABS
750.0000 mg | ORAL_TABLET | Freq: Two times a day (BID) | ORAL | Status: DC
Start: 2022-03-23 — End: 2022-03-24
  Administered 2022-03-23 – 2022-03-24 (×4): 750 mg via ORAL
  Filled 2022-03-23 (×4): qty 1

## 2022-03-23 MED ORDER — ALBUTEROL SULFATE HFA 108 (90 BASE) MCG/ACT IN AERS
2.0000 | INHALATION_SPRAY | Freq: Four times a day (QID) | RESPIRATORY_TRACT | Status: DC | PRN
Start: 2022-03-23 — End: 2022-03-24

## 2022-03-23 MED ORDER — PRAZOSIN HCL 1 MG PO CAPS
1.0000 mg | ORAL_CAPSULE | Freq: Every evening | ORAL | Status: DC
Start: 2022-03-23 — End: 2022-03-24
  Administered 2022-03-23 – 2022-03-24 (×2): 1 mg via ORAL
  Filled 2022-03-23 (×2): qty 1

## 2022-03-23 MED ORDER — LORAZEPAM 2 MG/ML IJ SOLN
1.00 mg | Freq: Once | INTRAMUSCULAR | Status: AC
Start: 2022-03-23 — End: 2022-03-23
  Administered 2022-03-23: 1 mg via INTRAVENOUS
  Filled 2022-03-23: qty 1

## 2022-03-23 MED ORDER — GABAPENTIN 300 MG PO CAPS
600.00 mg | ORAL_CAPSULE | Freq: Once | ORAL | Status: AC
Start: 2022-03-23 — End: 2022-03-23
  Administered 2022-03-23: 600 mg via ORAL
  Filled 2022-03-23: qty 2

## 2022-03-23 MED ORDER — SPIRONOLACTONE 25 MG PO TABS
50.0000 mg | ORAL_TABLET | Freq: Every day | ORAL | Status: DC
Start: 2022-03-23 — End: 2022-03-24
  Administered 2022-03-23 – 2022-03-24 (×2): 50 mg via ORAL
  Filled 2022-03-23 (×2): qty 2

## 2022-03-23 MED ORDER — TORSEMIDE 20 MG PO TABS
20.0000 mg | ORAL_TABLET | Freq: Every day | ORAL | Status: DC
Start: 2022-03-23 — End: 2022-03-24
  Administered 2022-03-23 – 2022-03-24 (×2): 20 mg via ORAL
  Filled 2022-03-23 (×2): qty 1

## 2022-03-23 MED ORDER — BUPRENORPHINE HCL-NALOXONE HCL 8-2 MG SL SUBL
8.0000 mg | SUBLINGUAL_TABLET | Freq: Three times a day (TID) | SUBLINGUAL | Status: DC
Start: 2022-03-23 — End: 2022-03-24
  Administered 2022-03-23 – 2022-03-24 (×5): 1 via SUBLINGUAL
  Filled 2022-03-23 (×5): qty 1

## 2022-03-23 MED ORDER — ZOLPIDEM TARTRATE 5 MG PO TABS
5.0000 mg | ORAL_TABLET | Freq: Every evening | ORAL | Status: DC
  Administered 2022-03-23 – 2022-03-24 (×2): 5 mg via ORAL
  Filled 2022-03-23 (×2): qty 1

## 2022-03-23 MED ORDER — NICOTINE 21 MG/24HR TD PT24
1.00 | MEDICATED_PATCH | Freq: Once | TRANSDERMAL | Status: AC
Start: 2022-03-23 — End: 2022-03-24
  Administered 2022-03-23: 1 via TRANSDERMAL
  Filled 2022-03-23: qty 1

## 2022-03-23 MED ORDER — DIAZEPAM 5 MG/ML IJ SOLN
10.0000 mg | INTRAMUSCULAR | Status: DC | PRN
Start: 2022-03-23 — End: 2022-03-24

## 2022-03-23 MED ORDER — DIAZEPAM 5 MG/ML IJ SOLN
20.0000 mg | Freq: Once | INTRAMUSCULAR | Status: AC | PRN
Start: 2022-03-23 — End: 2022-03-23

## 2022-03-23 MED ORDER — LEVOTHYROXINE SODIUM 25 MCG PO TABS
25.0000 ug | ORAL_TABLET | Freq: Every morning | ORAL | Status: DC
  Administered 2022-03-23 – 2022-03-24 (×2): 25 ug via ORAL
  Filled 2022-03-23 (×2): qty 1

## 2022-03-23 NOTE — Narrator Note (Signed)
Reports withdrawal symptoms worsening - CIWA updated, Dr.Gray aware - new orders for additional Ativan.

## 2022-03-23 NOTE — ED Provider Notes (Signed)
The patient was seen primarily by me. ED nursing record was reviewed. Prior records as available electronically through the Epic record were reviewed.       I have reviewed prior nursing notes, prior records and outside records for this patient.       Triage Documentation     Gregary Cromer, RN 03/23/2022 09:34             Patient BIBA from home with complains of feeling SI/HI for the past 24 hours now. Patient also report last drink this morning due to alcohol withdrawal. Patient arrive tearful but, calm and cooperative. Patient seeking for help. State I'm having a really hard time. My son was murder 1 1/2 year ago right in front of me and right now I'm feeling very suicidal and homicidal. I also need detox because I want to stop".     Complains of nausea and vomiting. State she's going through withdrawal. No there complains at this time.               HPI:    Cheryl Zimmerman is a 51 year old female with PMH of  has a past medical history of Bipolar affective ([name]) (schizo), Chronic systolic CHF (congestive heart failure) ([name]), Depression, Fall in home (12/23/2021), Gait instability (12/23/2021), Requires continuous at home supplemental oxygen (12/23/2021), Schizo affective schizophrenia ([name]) (depress), Spinal stenosis of lumbar 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 claudication (12/23/2021), and Substance abuse ([name]). presenting with suicidal and homicidal ideation and drug withdrawal  Patient states that she has been feeling down since the murder of her child, today she felt homicidal towards everybody and suicidal towards her self.  She did not attempt to hurt herself.  She does have a history of suicide attempts.  She also states that she was using cocaine and alcohol, last cocaine 2 days ago, last alcohol several hours prior to arrival, and feels she is withdrawing from both.  She states she knows she is withdrawing because she is nauseous and feels sweaty.  She denies any chest pain or abdominal pain.  She denies any additional  symptoms.  Denies any shortness of breath.  She states she has been taking her medications as usual, including her torsemide, antipsychotics, antiepileptics, pain medications.  She states she has been taking Suboxone 8 to 4 times daily instead of 3 times daily.  She would like help with detox and suicidality.          ROS: Pertinent positives were reviewed as per the HPI above.   All other pertinent systems were reviewed and are negative.      Past Medical History/Problem list:  Past Medical History:  schizo: Bipolar affective ([name])  No date: Chronic systolic CHF (congestive heart failure) ([name])  No date: Depression  12/23/2021: Fall in home  12/23/2021: Gait instability  12/23/2021: Requires continuous at home supplemental oxygen  depress: Schizo affective schizophrenia ([name])  12/23/2021: Spinal stenosis of lumbar 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   claudication  No date: Substance abuse [facility])  Patient Active Problem List:     Dyspnea on minimal exertion     Suicidal behavior with attempted self-injury ([name])     Tobacco use disorder     Systolic congestive heart failure ([name])     Seizure disorder ([name])     Schizoaffective disorder, depressive type ([name])     Suicidal ideation     Afib ([name])     Alcohol use disorder     Borderline personality disorder ([name])  Chronic pain disorder     COPD with hypoxia ([name])     Morbid obesity ([name])     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)     PTSD (post-traumatic stress disorder)     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 No pertinent surgical history.      Medications:  Current Facility-Administered Medications    Medication   . nicotine polacrilex (NICORETTE) gum 2 mg   . nicotine (NICODERM CQ) 21 MG/24HR 1 patch   . levETIRAcetam (KEPPRA) tablet 750 mg   . albuterol HFA inhaler 2 puff   . buprenorphine-naloxone (SUBOXONE) 8-2 MG SL tablet 1 tablet   . pantoprazole (PROTONIX) EC tablet 40 mg   . methocarbamol (ROBAXIN) tablet 500 mg   . OXcarbazepine (TRILEPTAL) tablet 300 mg   . spironolactone (ALDACTONE) tablet 50 mg   . levothyroxine (SYNTHROID) tablet 25 mcg   . torsemide (DEMADEX) tablet 60 mg   . torsemide (DEMADEX) tablet 20 mg   . baclofen (LIORESAL) tablet 10 mg   . doxepin (SINEquan) capsule 25 mg   . prazosin (MINIPRESS) capsule 1 mg   . zolpidem (AMBIEN) tablet 5 mg   . diazepam (VALIUM) tablet 5 mg   . diazepam (VALIUM) tablet 10 mg    Or   . diazePAM (VALIUM) injection 10 mg   . diazePAM (VALIUM) injection 20 mg    Or   . diazepam (VALIUM) tablet 20 mg     Current Outpatient Medications   Medication Sig   . torsemide 40 MG TABS Take 40 mg by mouth in the morning.   Marland Kitchen spironolactone (ALDACTONE) 50 MG tablet Take 1 tablet by mouth in the morning.   Marland Kitchen QUEtiapine (SEROQUEL) 50 MG tablet Take 2 tablets by mouth at bedtime   . albuterol HFA 108 (90 Base) MCG/ACT inhaler Inhale 2 puffs into the lungs every 6 (six) hours as needed for Wheezing   . atorvastatin (LIPITOR) 80 MG tablet Take 1 tablet by mouth in the morning.   Marland Kitchen FLUoxetine (PROZAC) 20 MG capsule Take 1 capsule by mouth in the morning.   . gabapentin (NEURONTIN) 300 MG capsule Take 2 capsules by mouth in the morning and 2 capsules at noon and 2 capsules before bedtime.   . levETIRAcetam (KEPPRA) 750 MG tablet Take 1 tablet by mouth in the morning and 1 tablet before bedtime.   Marland Kitchen loxapine (LOXITANE) 25 MG capsule Take 1 capsule by mouth in the morning and 1 capsule before bedtime.   . OXcarbazepine (TRILEPTAL) 150 MG tablet Take 1 tablet by mouth in the morning and 1 tablet before bedtime.   . prazosin (MINIPRESS) 1 MG capsule Take 3 capsules by mouth  nightly   . budesonide-formoterol (SYMBICORT) 160-4.5 MCG/ACT inhaler Inhale 2 puffs into the lungs in the morning and 2 puffs before bedtime.   Marland Kitchen levothyroxine (SYNTHROID) 25 MCG tablet Take 1 tablet by mouth every morning before breakfast   . OTHER MEDICATION Device: Inogen 3  Oxygen Flow Rate: 2-4 LPM  Titration Goal: SpO2 between 90% and 95%  Instructions:  1. Begin with 2 LPM and monitor oxygen saturation using a pulse oximeter.  2. If oxygen saturation is below 90%, increase the flow rate by 0.5 LPM every 15-30 minutes until the desired saturation level of 90%-95% is achieved.  3. If oxygen saturation is above 95%, decrease the flow rate by 0.5 LPM every 15-30 minutes until the desired saturation level of 90%-95% is achieved.  4. Once the desired saturation level is achieved, maintain the flow rate at that level.    DX: COPD with Hypoxia, Requires continuous supplemental oxygen  ICD10: Code: J44.9 and Z99.81    SIGNED Georgianne Fick, CNP, 01/05/2022    Nurse Practitioner   Hospitalist Division  [facility]  [address] [place] [address] [place] [address]  Phone:[phone] (fastest) or [phone]   Email:[email or web address]     NPI: [id number]   . OTHER MEDICATION Wheelchair: Electric  For use as directed.    DX: Gait instability and falls due to lumbar stenosis of central canal     ICD10: R26.81         Social History: Social History    Tobacco Use      Smoking status: Every Day        Packs/day: 3.00        Years: 30.00        Pack years: 24        Types: Cigarettes        Passive exposure: Never      Smokeless tobacco: Never    Vaping Use      Vaping status: Not on file    Alcohol use: Yes      Comment: 2 liters of captain morgan daily        Allergies:  Review of Patient's Allergies indicates:   Bee venom               Anaphylaxis   Methylprednisolone      Dizziness, Drowsiness    Comment:Also believes syncope. Seems to tolerate             inhalational and epidural steroid.   Nutritional supplem*       Hydroxyzine              Nausea Only      Physical Exam:  ED Triage Vitals [03/23/22 0922]   ED Triage Vitals Brief Group      Temp 97.7 F      Pulse 88      Resp 16      BP 154/91      SpO2 93 %      Pain Score 0        GENERAL: No acute distress.  Diaphoretic  SKIN:  Warm & Dry, no rash.  HEAD: Normocephalic, atraumatic.   NECK:  supple, normal ROM   LUNGS:  Clear to auscultation bilaterally. No wheezes, rales, rhonchi.   CARDIOVASCULAR:  RRR.  WWP throughout  ABDOMEN:  Soft, non-tender.  No guarding or rebound tenderness.   MUSCULOSKELETAL:  No obvious deformities.    NEUROLOGIC: Alert and oriented.  Cranial Nerves grossly intact. Moves all extremities well.  PSYCHIATRIC: Dysphoric mood, normal speech, normal thought process does not appear psychotic at this time.+ SI/HI        ED Course and Medical Decision-making:    Cheryl Zimmerman is a  51 year old female with PMH of  has a past medical history of Bipolar affective ([name]) (  schizo), Chronic systolic CHF (congestive heart failure) ([name]), Depression, Fall in home (12/23/2021), Gait instability (12/23/2021), Requires continuous at home supplemental oxygen (12/23/2021), Schizo affective schizophrenia ([name]) (depress), Spinal stenosis of lumbar 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 claudication (12/23/2021), and Substance abuse ([name]). presenting with SI, HI, alcohol and cocaine withdrawal.  Patient also has a complex medical history including heart failure, hypothyroidism, chronic pain.  Patient does have nausea and diaphoresis however this appears to be more consistent with withdrawal than cardiac or intra-abdominal etiology.  Plan for EKG to evaluate for any ischemic disease and QTc given number of medications patient is on and plan for labs to evaluate for intra-abdominal causes of nausea.  Prior to starting patient's diuresis dose, plan to evaluate for pulmonary edema.  Patient oxygen level is 93% which is likely usual for her.      ED COURSE:  ED Course as of 03/23/22 1954   Wed Mar 23, 2022   1242  Ongoing withdrawal, given additional Ativan, placed on ED CIWA scale   1059 Patient's LFTs, AST are slightly elevated consistent with alcoholic hepatitis, ethanol elevated, magnesium within normal limits, TSH within normal limits, lipase within normal limits.  Chest x-ray shows no evidence of fluid overload to account for patient's hypoxia, likely baseline for patient.  We will continue diuresis per her medication list.  Patient is medically cleared for psychiatric evaluation   1038 QTc 490.  Have held Haldol and Reglan.  Potassium within normal limits at 4.2.  Creatinine 0.6.  Ethanol positive but salicylate and acetaminophen levels are negative.  Drug screen positive for cocaine and alcohol.  Consistent with patient history         Medications Given:  Medications   nicotine polacrilex (NICORETTE) gum 2 mg (2 mg Oral Given 03/23/22 1808)   nicotine (NICODERM CQ) 21 MG/24HR 1 patch (1 patch Transdermal Patch Applied 03/23/22 0951)   levETIRAcetam (KEPPRA) tablet 750 mg (750 mg Oral Given 03/23/22 1016)   albuterol HFA inhaler 2 puff (has no administration in time range)   buprenorphine-naloxone (SUBOXONE) 8-2 MG SL tablet 1 tablet (1 tablet Sublingual Given 03/23/22 1046)   pantoprazole (PROTONIX) EC tablet 40 mg (40 mg Oral Given 03/23/22 1046)   methocarbamol (ROBAXIN) tablet 500 mg (500 mg Oral Given 03/23/22 1235)   OXcarbazepine (TRILEPTAL) tablet 300 mg (300 mg Oral Given 03/23/22 1046)   spironolactone (ALDACTONE) tablet 50 mg (50 mg Oral Given 03/23/22 1046)   levothyroxine (SYNTHROID) tablet 25 mcg (25 mcg Oral Given 03/23/22 1046)   torsemide (DEMADEX) tablet 60 mg (60 mg Oral Given 03/23/22 1046)   torsemide (DEMADEX) tablet 20 mg (20 mg Oral Given 03/23/22 1611)   baclofen (LIORESAL) tablet 10 mg (10 mg Oral Given 03/23/22 1046)   doxepin (SINEquan) capsule 25 mg (25 mg Oral Not Given 03/23/22 1052)   prazosin (MINIPRESS) capsule 1 mg (has no administration in time range)   zolpidem (AMBIEN) tablet 5 mg (has no administration in  time range)   diazepam (VALIUM) tablet 5 mg (has no administration in time range)   diazepam (VALIUM) tablet 10 mg (10 mg Oral Given 03/23/22 1808)     Or   diazePAM (VALIUM) injection 10 mg ( Intravenous See Alternative 03/23/22 1808)   diazePAM (VALIUM) injection 20 mg (has no administration in time range)     Or   diazepam (VALIUM) tablet 20 mg (has no administration in time range)   LORazepam (ATIVAN) injection 1 mg (1 mg Intravenous Given 03/23/22 0951)   gabapentin (  NEURONTIN) capsule 600 mg (600 mg Oral Given 03/23/22 1016)   LORazepam (ATIVAN) injection 1 mg (1 mg Intravenous Given 03/23/22 1235)       Labs:  Labs Reviewed   CBC, PLATELET & DIFFERENTIAL - Abnormal; Notable for the following components:       Result Value    WHITE BLOOD CELL COUNT 11.9 (*)     HEMATOCRIT 46.2 (*)     RBC DISTRIBUTION WIDTH STD DEV 47.0 (*)     All other components within normal limits   BASIC METABOLIC PANEL - Abnormal; Notable for the following components:    SODIUM 134 (*)     CHLORIDE 96 (*)     BUN (UREA NITROGEN) 6 (*)     All other components within normal limits    Narrative:     Tests added: TSH,MG,HFT,LIP by JG on 03/23/22 at 1020 by  [email or web address].   SERUM DRUG SCREEN - Abnormal; Notable for the following components:    ETHANOL 49 (*)     All other components within normal limits    Narrative:     Tests added: TSH,MG,HFT,LIP by JG on 03/23/22 at 1020 by  [email or web address].   URINE DRUG SCREEN - Abnormal; Notable for the following components:    COCAINE METABOLITES URINE POSITIVE (*)     ETHANOL URINE POSITIVE (*)     BUPRENORPHINE SCREEN URINE POSITIVE (*)     All other components within normal limits    Narrative:     URINE&Urine   HEPATIC FUNCTION PANEL - Abnormal; Notable for the following components:    ALKALINE PHOSPHATASE 184 (*)     ASPARTATE AMINOTRANSFERASE 45 (*)     All other components within normal limits    Narrative:     Tests added: TSH,MG,HFT,LIP by JG on 03/23/22 at 1020 by  [email or web address].   LAB ADD ON/WRITE IN TEST   LAB ADD ON/WRITE IN  TEST   MAGNESIUM    Narrative:     Tests added: TSH,MG,HFT,LIP by JG on 03/23/22 at 1020 by  [email or web address].   TSH (THYROID STIMULATING HORMONE)    Narrative:     Tests added: TSH,MG,HFT,LIP by JG on 03/23/22 at 1020 by  [email or web address].   LIPASE    Narrative:     Tests added: TSH,MG,HFT,LIP by JG on 03/23/22 at 1020 by  GM29.   COVID-19 INPATIENT       Imaging:  XR Chest Portable   Final Result      TECHNIQUE: Portable chest, 10:43 a.m.         Indication: eval for pulm edema sat 93%         Comparison: 01/11/2022        FINDINGS:        Quality: Satisfactory.           Tubes/lines: None.        Lungs: The lungs appear clear.         Pleura: There is no pleural effusion or pneumothorax.        Heart: The cardiac silhouette is unchanged.         Mediastinum/hila: Unremarkable.        Bones and Soft Tissues: There is convex right curvature of the thoracic     spine. There is right glenohumeral degenerative joint disease. A metallic     density is redemonstrated overlying the cervical spine.          IMPRESSION:        No acute  cardiopulmonary findings on portable chest radiograph.             Reviewed and Electronically Signed By: Rosine Abe, MD    Signed Date and Time: 03/23/2022 10:54 AM              *         Condition: Improved and Stable    Disposition: Pending psychiatric placement    Diagnosis/Diagnoses:  Suicidal ideation  Homicidal ideation

## 2022-03-23 NOTE — Narrator Note (Signed)
Patient sleeping.  Respirations even, unlabored.  Will continue to monitor  Continues on safety watch

## 2022-03-23 NOTE — Narrator Note (Signed)
Withdrawal symptoms improved per patient at this time. Changed into hospital clothes. Belongings secured. Safety watch in effect.

## 2022-03-23 NOTE — Narrator Note (Signed)
Patient requesting additional nicotine gum and valium.
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
## 2022-03-23 NOTE — ED Notes (Signed)
Patient care transitioned to me at shift change.   ED Course as of 03/23/22 2105   Wed Mar 23, 2022   1708 [name] presenting with SI/HI, pending [name] evaluation.

## 2022-03-23 NOTE — Narrator Note (Signed)
Ate dinner, [name] at bedside.

## 2022-03-23 NOTE — Narrator Note (Signed)
Patient reports hx of OSA and wears 2L home O2 during sleeping.

## 2022-03-23 NOTE — ED Triage Note (Signed)
Patient BIBA from home with complains of feeling SI/HI for the past 24 hours now. Patient also report last drink this morning due to alcohol withdrawal. Patient arrive tearful but, calm and cooperative. Patient seeking for help. State I'm having a really hard time. My son was murder 1 1/2 year ago right in front of me and right now I'm feeling very suicidal and homicidal. I also need detox because I want to stop".     Complains of nausea and vomiting. State she's going through withdrawal. No there complains at this time.

## 2022-03-24 ENCOUNTER — Encounter (HOSPITAL_BASED_OUTPATIENT_CLINIC_OR_DEPARTMENT_OTHER): Payer: Self-pay

## 2022-03-24 ENCOUNTER — Inpatient Hospital Stay
Admission: AD | Admit: 2022-03-24 | Discharge: 2022-04-07 | DRG: 885 | Disposition: A | Discharge status: Other Acute Inpatient Hospital | Payer: No Typology Code available for payment source | Source: Intra-hospital | Attending: Psychiatry | Admitting: Psychiatry

## 2022-03-24 ENCOUNTER — Encounter (HOSPITAL_BASED_OUTPATIENT_CLINIC_OR_DEPARTMENT_OTHER): Payer: Self-pay | Admitting: Psychiatry

## 2022-03-24 DIAGNOSIS — F331 Major depressive disorder, recurrent, moderate: Secondary | ICD-10-CM | POA: Diagnosis present

## 2022-03-24 DIAGNOSIS — F4310 Post-traumatic stress disorder, unspecified: Secondary | ICD-10-CM | POA: Diagnosis present

## 2022-03-24 DIAGNOSIS — R0902 Hypoxemia: Secondary | ICD-10-CM | POA: Diagnosis not present

## 2022-03-24 DIAGNOSIS — R45850 Homicidal ideations: Secondary | ICD-10-CM | POA: Diagnosis not present

## 2022-03-24 DIAGNOSIS — F1490 Cocaine use, unspecified, uncomplicated: Secondary | ICD-10-CM | POA: Diagnosis present

## 2022-03-24 DIAGNOSIS — F1120 Opioid dependence, uncomplicated: Secondary | ICD-10-CM | POA: Diagnosis present

## 2022-03-24 DIAGNOSIS — I208 Other forms of angina pectoris: Secondary | ICD-10-CM | POA: Diagnosis not present

## 2022-03-24 DIAGNOSIS — Z008 Encounter for other general examination: Secondary | ICD-10-CM | POA: Diagnosis not present

## 2022-03-24 DIAGNOSIS — M5430 Sciatica, unspecified side: Secondary | ICD-10-CM

## 2022-03-24 DIAGNOSIS — Z5900 Homelessness unspecified: Secondary | ICD-10-CM

## 2022-03-24 DIAGNOSIS — F333 Major depressive disorder, recurrent, severe with psychotic symptoms: Principal | ICD-10-CM | POA: Diagnosis present

## 2022-03-24 DIAGNOSIS — R079 Chest pain, unspecified: Secondary | ICD-10-CM | POA: Diagnosis not present

## 2022-03-24 DIAGNOSIS — F250 Schizoaffective disorder, bipolar type: Secondary | ICD-10-CM | POA: Diagnosis present

## 2022-03-24 DIAGNOSIS — R0789 Other chest pain: Secondary | ICD-10-CM | POA: Diagnosis not present

## 2022-03-24 DIAGNOSIS — E039 Hypothyroidism, unspecified: Secondary | ICD-10-CM

## 2022-03-24 DIAGNOSIS — R0609 Other forms of dyspnea: Secondary | ICD-10-CM | POA: Diagnosis not present

## 2022-03-24 DIAGNOSIS — G40909 Epilepsy, unspecified, not intractable, without status epilepticus: Secondary | ICD-10-CM | POA: Diagnosis present

## 2022-03-24 DIAGNOSIS — F1990 Other psychoactive substance use, unspecified, uncomplicated: Secondary | ICD-10-CM

## 2022-03-24 DIAGNOSIS — J811 Chronic pulmonary edema: Secondary | ICD-10-CM | POA: Diagnosis not present

## 2022-03-24 DIAGNOSIS — R9431 Abnormal electrocardiogram [ECG] [EKG]: Secondary | ICD-10-CM | POA: Diagnosis not present

## 2022-03-24 DIAGNOSIS — J449 Chronic obstructive pulmonary disease, unspecified: Secondary | ICD-10-CM | POA: Diagnosis present

## 2022-03-24 DIAGNOSIS — R45851 Suicidal ideations: Secondary | ICD-10-CM | POA: Diagnosis present

## 2022-03-24 DIAGNOSIS — F251 Schizoaffective disorder, depressive type: Secondary | ICD-10-CM | POA: Diagnosis not present

## 2022-03-24 DIAGNOSIS — R778 Other specified abnormalities of plasma proteins: Secondary | ICD-10-CM | POA: Diagnosis not present

## 2022-03-24 DIAGNOSIS — F1090 Alcohol use, unspecified, uncomplicated: Secondary | ICD-10-CM | POA: Diagnosis present

## 2022-03-24 DIAGNOSIS — F603 Borderline personality disorder: Secondary | ICD-10-CM | POA: Diagnosis present

## 2022-03-24 DIAGNOSIS — K219 Gastro-esophageal reflux disease without esophagitis: Secondary | ICD-10-CM | POA: Diagnosis not present

## 2022-03-24 DIAGNOSIS — I5032 Chronic diastolic (congestive) heart failure: Secondary | ICD-10-CM | POA: Diagnosis present

## 2022-03-24 DIAGNOSIS — R112 Nausea with vomiting, unspecified: Secondary | ICD-10-CM | POA: Diagnosis not present

## 2022-03-24 DIAGNOSIS — Z9981 Dependence on supplemental oxygen: Secondary | ICD-10-CM | POA: Diagnosis not present

## 2022-03-24 DIAGNOSIS — I509 Heart failure, unspecified: Secondary | ICD-10-CM | POA: Diagnosis not present

## 2022-03-24 DIAGNOSIS — G8929 Other chronic pain: Secondary | ICD-10-CM | POA: Diagnosis not present

## 2022-03-24 DIAGNOSIS — R109 Unspecified abdominal pain: Secondary | ICD-10-CM | POA: Diagnosis not present

## 2022-03-24 MED ORDER — TRAZODONE HCL 50 MG PO TABS
50.0000 mg | ORAL_TABLET | Freq: Every evening | ORAL | Status: DC | PRN
Start: 2022-03-24 — End: 2022-04-07
  Administered 2022-03-27 – 2022-04-02 (×2): 50 mg via ORAL
  Filled 2022-03-24 (×2): qty 1

## 2022-03-24 MED ORDER — ALBUTEROL SULFATE HFA 108 (90 BASE) MCG/ACT IN AERS
2.0000 | INHALATION_SPRAY | Freq: Four times a day (QID) | RESPIRATORY_TRACT | Status: DC | PRN
Start: 2022-03-24 — End: 2022-04-07
  Administered 2022-04-01 – 2022-04-06 (×7): 2 via RESPIRATORY_TRACT
  Filled 2022-03-24 (×2): qty 6.7

## 2022-03-24 MED ORDER — METHOCARBAMOL 500 MG PO TABS
500.0000 mg | ORAL_TABLET | Freq: Three times a day (TID) | ORAL | Status: DC
Start: 2022-03-24 — End: 2022-04-07
  Administered 2022-03-24 – 2022-04-07 (×42): 500 mg via ORAL
  Filled 2022-03-24 (×42): qty 1

## 2022-03-24 MED ORDER — GABAPENTIN 300 MG PO CAPS
600.0000 mg | ORAL_CAPSULE | Freq: Three times a day (TID) | ORAL | Status: DC
Start: 2022-03-24 — End: 2022-03-24
  Administered 2022-03-24 (×3): 600 mg via ORAL
  Filled 2022-03-24 (×2): qty 2

## 2022-03-24 MED ORDER — LEVOTHYROXINE SODIUM 25 MCG PO TABS
25.0000 ug | ORAL_TABLET | Freq: Every morning | ORAL | Status: DC
Start: 2022-03-25 — End: 2022-04-07
  Administered 2022-03-25 – 2022-04-07 (×14): 25 ug via ORAL
  Filled 2022-03-24 (×14): qty 1

## 2022-03-24 MED ORDER — GABAPENTIN 300 MG PO CAPS
ORAL_CAPSULE | ORAL | Status: DC
Start: 2022-03-24 — End: 2022-03-24
  Filled 2022-03-24: qty 2

## 2022-03-24 MED ORDER — BACLOFEN 10 MG PO TABS
10.0000 mg | ORAL_TABLET | Freq: Two times a day (BID) | ORAL | Status: DC
Start: 2022-03-24 — End: 2022-04-07
  Administered 2022-03-24 – 2022-04-07 (×28): 10 mg via ORAL
  Filled 2022-03-24 (×28): qty 1

## 2022-03-24 MED ORDER — OXCARBAZEPINE 150 MG PO TABS
300.0000 mg | ORAL_TABLET | Freq: Two times a day (BID) | ORAL | Status: DC
Start: 2022-03-24 — End: 2022-03-26
  Administered 2022-03-24 – 2022-03-26 (×4): 300 mg via ORAL
  Filled 2022-03-24 (×4): qty 2

## 2022-03-24 MED ORDER — TORSEMIDE 20 MG PO TABS
40.0000 mg | ORAL_TABLET | Freq: Every day | ORAL | Status: DC
Start: 2022-03-25 — End: 2022-03-25
  Administered 2022-03-25: 40 mg via ORAL
  Filled 2022-03-24: qty 2

## 2022-03-24 MED ORDER — LEVETIRACETAM 500 MG PO TABS
750.0000 mg | ORAL_TABLET | Freq: Two times a day (BID) | ORAL | Status: DC
Start: 2022-03-25 — End: 2022-04-07
  Administered 2022-03-25 – 2022-04-07 (×27): 750 mg via ORAL
  Filled 2022-03-24 (×27): qty 1

## 2022-03-24 MED ORDER — MAGNESIUM HYDROXIDE 400 MG/5ML PO SUSP
30.0000 mL | Freq: Every day | ORAL | Status: DC | PRN
Start: 2022-03-24 — End: 2022-04-07
  Administered 2022-03-29 – 2022-04-03 (×3): 30 mL via ORAL
  Filled 2022-03-24 (×3): qty 30

## 2022-03-24 MED ORDER — SENNOSIDES 8.6 MG PO TABS
17.2000 mg | ORAL_TABLET | Freq: Every evening | ORAL | Status: DC
Start: 2022-03-24 — End: 2022-04-07
  Administered 2022-03-24 – 2022-04-06 (×14): 17.2 mg via ORAL
  Filled 2022-03-24 (×14): qty 2

## 2022-03-24 MED ORDER — NICOTINE POLACRILEX 2 MG MT GUM
2.0000 mg | CHEWING_GUM | OROMUCOSAL | Status: DC | PRN
Start: 2022-03-24 — End: 2022-03-25
  Administered 2022-03-25 (×2): 2 mg via ORAL
  Filled 2022-03-24 (×2): qty 1

## 2022-03-24 MED ORDER — ALUMINUM-MAGNESIUM-SIMETHICONE 200-200-20 MG/5ML PO SUSP
30.0000 mL | ORAL | Status: DC | PRN
Start: 2022-03-24 — End: 2022-04-07
  Administered 2022-04-01: 30 mL via ORAL
  Filled 2022-03-24: qty 30

## 2022-03-24 MED ORDER — OLANZAPINE 5 MG PO TBDP
5.0000 mg | ORAL_TABLET | Freq: Four times a day (QID) | ORAL | Status: DC | PRN
Start: 2022-03-24 — End: 2022-03-29

## 2022-03-24 MED ORDER — ACETAMINOPHEN 325 MG PO TABS
650.0000 mg | ORAL_TABLET | ORAL | Status: DC | PRN
Start: 2022-03-24 — End: 2022-04-07
  Administered 2022-03-26 – 2022-04-07 (×20): 650 mg via ORAL
  Filled 2022-03-24 (×23): qty 2

## 2022-03-24 MED ORDER — DIAZEPAM 5 MG PO TABS
10.0000 mg | ORAL_TABLET | ORAL | Status: DC | PRN
Start: 2022-03-24 — End: 2022-03-29
  Administered 2022-03-25: 10 mg via ORAL
  Filled 2022-03-24: qty 2

## 2022-03-24 MED ORDER — PANTOPRAZOLE SODIUM 40 MG PO TBEC
40.0000 mg | DELAYED_RELEASE_TABLET | Freq: Every day | ORAL | Status: DC
Start: 2022-03-25 — End: 2022-04-07
  Administered 2022-03-25 – 2022-04-07 (×14): 40 mg via ORAL
  Filled 2022-03-24 (×14): qty 1

## 2022-03-24 MED ORDER — BUPRENORPHINE HCL-NALOXONE HCL 8-2 MG SL SUBL
8.0000 mg | SUBLINGUAL_TABLET | Freq: Three times a day (TID) | SUBLINGUAL | Status: DC
Start: 2022-03-24 — End: 2022-04-07
  Administered 2022-03-24 – 2022-04-07 (×42): 1 via SUBLINGUAL
  Filled 2022-03-24 (×42): qty 1

## 2022-03-24 MED ORDER — POLYETHYLENE GLYCOL 3350 17 G PO PACK
17.0000 g | PACK | Freq: Every day | ORAL | Status: DC
Start: 2022-03-25 — End: 2022-04-07
  Administered 2022-03-25 – 2022-04-07 (×13): 17 g via ORAL
  Filled 2022-03-24 (×14): qty 1

## 2022-03-24 MED ORDER — GABAPENTIN 300 MG PO CAPS
600.0000 mg | ORAL_CAPSULE | Freq: Three times a day (TID) | ORAL | Status: DC
Start: 2022-03-24 — End: 2022-04-07
  Administered 2022-03-24 – 2022-04-07 (×42): 600 mg via ORAL
  Filled 2022-03-24 (×42): qty 2

## 2022-03-24 MED ORDER — PRAZOSIN HCL 1 MG PO CAPS
1.0000 mg | ORAL_CAPSULE | Freq: Every evening | ORAL | Status: DC
Start: 2022-03-24 — End: 2022-04-07
  Administered 2022-03-24 – 2022-04-06 (×14): 1 mg via ORAL
  Filled 2022-03-24 (×14): qty 1

## 2022-03-24 MED ORDER — NICOTINE 21 MG/24HR TD PT24
1.0000 | MEDICATED_PATCH | Freq: Every day | TRANSDERMAL | Status: DC
Start: 2022-03-25 — End: 2022-04-07
  Administered 2022-03-25 – 2022-04-07 (×14): 1 via TRANSDERMAL
  Filled 2022-03-24 (×14): qty 1

## 2022-03-24 MED ORDER — SPIRONOLACTONE 25 MG PO TABS
50.0000 mg | ORAL_TABLET | Freq: Every day | ORAL | Status: DC
Start: 2022-03-25 — End: 2022-04-07
  Administered 2022-03-25 – 2022-04-07 (×14): 50 mg via ORAL
  Filled 2022-03-24 (×14): qty 2

## 2022-03-24 MED ORDER — LORAZEPAM 1 MG PO TABS
1.0000 mg | ORAL_TABLET | Freq: Four times a day (QID) | ORAL | Status: DC | PRN
Start: 2022-03-24 — End: 2022-03-25
  Administered 2022-03-25: 1 mg via ORAL
  Filled 2022-03-24: qty 1

## 2022-03-24 NOTE — Narrator Note (Signed)
Assumed care from day shift RN. Patient awaiting for a hospital bed.

## 2022-03-24 NOTE — ED Notes (Signed)
ED Attending Sign-out Note  Pt received in sign-out at change of shift.  ED Course as of 03/24/22 1755   Thu Mar 24, 2022   1755 Accepted [facility].   1516 Signout: SI         Impression:  Suicidal ideation  Homicidal ideation    Disposition:  Psych Bed Search      Signed by Velva Harman, MD  Emergency Medicine Attending  [facility]

## 2022-03-24 NOTE — Plan of Care (Addendum)
Problem: Coping Ineffective, Individual  Description: 51 year old bipolar female with chief complaint "I am having a hard time", presents tearful, but calm and cooperative, seeking help for feelings of SI/HI and withdrawal symptoms in the context of alcohol intoxication  Goal: Demonstrates effective coping during hospitalization  Description: The patient will continue to seek assistance for the management of her psychotic impulses throughout her hospital stay, and they will be at manageable levels prior to discharge   Outcome: Progressing  Goal: Demonstrates/reports adjustment to illness/condition during hospitalization  Description: Patient will use appropriate coping skills to repress her dysregulated impulses and report improved wellbeing through interactions with peers and staff on the unit   Outcome: Progressing  Pt is a 51 year old homeless female who presented to the [facility] ED on a section 12 d/t suicidal and homicidal ideation and drug withdrawal. PMH of Bipolar D/O, PTSD, Schizoaffective D/O, Spinal stenosis of lumbar 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 claudication, Seizures, and Polysubstance abuse, chronic systolic CHF, Depression, Hx of falls, gait instability. She requires continuous supplemental oxygen. Alert and oriented x 3. VS: 97.7, 87, 18, 117/81. O2 Sat 95% via NC 2 L. Pt denied SI/HI. Compliant with the admission process, she signed all paperwork. B and B performed. Pt's skin is intact. She came in with valuables, one yellow ring, one yellow necklace, 7 debit and credit cards, and a Samsung cellphone w/ charger sent to the safe, envelop [phone]. She uses a rollator for ambulation. Her gait is steady. Independent w/ ADLs. She is placed on 15-minute safety checks room 216 A. She has received her evening medications. We will continue to monitor.

## 2022-03-24 NOTE — Narrator Note (Signed)
Report to [facility] ~  Ambulance booked at this time

## 2022-03-24 NOTE — H&P (Addendum)
Department of Psychiatry  Admission Physical Examination, Review of Systems, & Medical Clearance      History of Present Illness: 48F w/ PMHx HFpEF, seizure disorder, hypothyroidism, ?COPD/OSA, GERD, chronic pain, spinal stenosis w/ sciatica, polysubstance use, schizoaffective disorder bipolar type, PTSD, and SI p/w SI/HI, admitted to [facility] on 7/6.    Reports gradual increase of SOB/DOE over the past month. Has not noticed leg swelling getting worse and thinks overall they look better. Dry cough over past month. No fevers. No wheezing. No chest or abd pain. Last emetic episode related to EtOH withdrawal was just PTA. Was drinking ~ 2 liters of liquor per day. Normal BM yesterday. C/o excessive day time fatigue even when not usuing supplemental oxygen (likely related to OSA). Has never had sleep study but is open to it.           Past Medical History:   Past Medical History:  schizo: Bipolar affective ([name])  No date: Chronic systolic CHF (congestive heart failure) ([name])  No date: Depression  12/23/2021: Fall in home  12/23/2021: Gait instability  12/23/2021: Requires continuous at home supplemental oxygen  depress: Schizo affective schizophrenia ([name])  12/23/2021: Spinal stenosis of lumbar 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   claudication  No date: Substance abuse [facility])    Past Surgical History:   No past surgical history on file.    Medications prior to Admission:   Prior to Admission Medications   Prescriptions Last Dose Informant Patient Reported? Taking?   FLUoxetine (PROZAC) 20 MG capsule   No No   Sig: Take 1 capsule by mouth in the morning.   OTHER MEDICATION   No No   Sig: Wheelchair: Electric  For use as directed.    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 Begin with 2 LPM and monitor oxygen saturation using a pulse oximeter.  2. If  oxygen saturation is below 90%, increase the flow rate by 0.5 LPM every 15-30 minutes until the desired saturation level of 90%-95% is achieved.  3. If oxygen saturation is above 95%, decrease the flow rate by 0.5 LPM every 15-30 minutes until the desired saturation level of 90%-95% is achieved.  4. Once the desired saturation level is achieved, maintain the flow rate at that level.    DX: COPD with Hypoxia, Requires continuous supplemental oxygen  ICD10: Code: J44.9 and Z99.81    SIGNED Georgianne Fick, CNP, 01/05/2022    Nurse Practitioner   Hospitalist Division  [facility]  Units [place] [address] and [facility]  Phone:[phone] (fastest) or [phone]   Email:[email or web address]     NPI: [id number]   OXcarbazepine (TRILEPTAL) 150 MG tablet   No No   Sig: Take 1 tablet by mouth in the morning and 1 tablet before bedtime.   QUEtiapine (SEROQUEL) 50 MG tablet   No No   Sig: Take 2 tablets by mouth at bedtime   albuterol HFA 108 (90 Base) MCG/ACT inhaler   No No   Sig: Inhale 2 puffs into the lungs every 6 (six) hours as needed for Wheezing   atorvastatin (LIPITOR) 80 MG tablet   No No   Sig: Take 1 tablet by mouth in the morning.   budesonide-formoterol (SYMBICORT) 160-4.5 MCG/ACT inhaler  No No   Sig: Inhale 2 puffs into the lungs in the morning and 2 puffs before bedtime.   gabapentin (NEURONTIN) 300 MG capsule   No No   Sig: Take 2 capsules by mouth in the morning and 2 capsules at noon and 2 capsules before bedtime.   levETIRAcetam (KEPPRA) 750 MG tablet   No No   Sig: Take 1 tablet by mouth in the morning and 1 tablet before bedtime.   levothyroxine (SYNTHROID) 25 MCG tablet   No No   Sig: Take 1 tablet by mouth every morning before breakfast   loxapine (LOXITANE) 25 MG capsule   No No   Sig: Take 1 capsule by mouth in the morning and 1 capsule before bedtime.   prazosin (MINIPRESS) 1 MG capsule   No No   Sig: Take 3 capsules by mouth nightly   spironolactone (ALDACTONE) 50 MG tablet   No No   Sig: Take 1 tablet  by mouth in the morning.   torsemide 40 MG TABS   No No   Sig: Take 40 mg by mouth in the morning.      Facility-Administered Medications: None        Allergies:   Review of Patient's Allergies indicates:   Bee venom               Anaphylaxis   Methylprednisolone      Dizziness, Drowsiness    Comment:Also believes syncope. Seems to tolerate             inhalational and epidural steroid.   Nutritional supplem*       Hydroxyzine             Nausea Only    Social History:  Tobacco Use: yes  Alcohol: yes    Family History:   Mother and father have no hx of CAD.    Review of Systems: No fevers, SOB, CP, abd pain, n/v/d, no focal weakness.   All other organ systems are reviewed and are negative.    Physical Exam:  Vital Signs - Last 8 Hours:   BP: (115-121)/(74-81)   Temp:  [97.5 F (36.4 C)-97.7 F (36.5 C)]   Pulse:  [85-87]   Resp:  [18-20]   SpO2:  [95 %-96 %]     GEN: NAD, sitting in chair  EENT: EOMI, clear sclera  Lungs: CTAB, respirations unlabored  Heart: RRR  Abd: Soft, non-tender, non-distended  Neuro: No focal deficits, alert  Skin: warm, dry  Extremities: 3+ pitting edema B/L LE   Psych: normal affect     Cranial Nerves:   II: PERRLA  III, IV, VI: EOMs intact b/l without nystagmus  V, VII: facial strength and sensation intact and symmetric  VIII: Intact to voice  IX, X: palatal elevation symmetrical  XI: Shoulder shrug symmetrical and strength intact  XII: tongue midline without fasciculations      Recent Labs:  BMP:   Recent Labs     03/23/22  0929   NA 134*   K 4.2   CL 96*   CO2 21   BUN 6*   CREAT 0.6   GLUCOSER 93     Ca, Mg, Phos:   Recent Labs     03/23/22  0929   CA 9.3   MG 2.0     CBC:   Recent Labs     03/23/22  0929   WBC 11.9*   HCT 46.2*   PLTA 383   RBC  5.05     Coagulation Labs: No results for input(s): PT, INR in the last 72 hours.    Invalid input(s): PTT  LFTs:   Recent Labs     03/23/22  0929   AST 45*   ALT 24   TBILI 0.3   DBILI < 0.2   ALKPHOS 184*     Pancreatic Enzymes:   Recent  Labs     03/23/22  0929   LIP 35     Urinalysis:No results for input(s): UACOL, UACLA, UAGLU, UABIL, UAKET, SPEGRAVURINE, UAOCC, UAPH, UAPRO, UANIT, LEUKOCYTES in the last 72 hours.    Invalid input(s): UAOBU  Troponin: No results for input(s): TROPI in the last 72 hours.    Imaging:     XR Chest Portable    Result Date: 03/23/2022    TECHNIQUE: Portable chest, 10:43 a.m.  Indication: eval for pulm edema sat 93% Comparison: 01/11/2022 FINDINGS: Quality: Satisfactory.   Tubes/lines: None. Lungs: The lungs appear clear. Pleura: There is no pleural effusion or pneumothorax. Heart: The cardiac silhouette is unchanged. Mediastinum/hila: Unremarkable. Bones and Soft Tissues: There is convex right curvature of the thoracic spine. There is right glenohumeral degenerative joint disease. A metallic density is redemonstrated overlying the cervical spine.       No acute cardiopulmonary findings on portable chest radiograph.  Reviewed and Electronically Signed By: Elzie Rings, MD Signed Date and Time: 03/23/2022 10:54 AM        Assessment and Plan  74F w/ PMHx HFpEF, seizure disorder, hypothyroidism, ?COPD/OSA, GERD, chronic pain, spinal stenosis w/ sciatica, polysubstance use, schizoaffective disorder bipolar type, PTSD, and SI p/w SI/HI, admitted to [facility] on 7/6.    #polysubstance use  -etoh, opioids, cocaine, tobacco  -etoh 49 on sds  -bmp unremarkable  -on valium taper (initially on ciwa)  -discuss cessation and MAT (acamprosate, suboxone, nicotine, etc)  -appears has been on suboxone 8-2 tid since at least 2021 (most recent fill per PDMP 6/28 for 7 d Rx); would continue this while hospitalized. Also on gabapentin which may help w/ etoh abstinence. Could consider acamprosate but would unlikely be of much benefit and may increase risk of other med non-adherence due to polypharmacy. Could consider topamax for etoh cessation. C/w nicotine patch and gum (pt requesting 2 pieces of gum at a time as she smokes ~3  ppd)    #HFpEF  -no acute exacerbation though c/f increasing hypervolemia as outlined below. CXR without acute abnormality. Anticipate will have desat on room air while sleeping due to OSA (using supplemental O2 here during sleep or when feeling SOB- do not see any documented sig desat while awake)  -EF 70% March 2023   -c/w torsemide-->will increase to 40 bid, spironolactone  -previously on up to torsemide 120 tid w/ metolazone 5 qd (seemed to be tolerating torsemide 120 bid well though there was question of medication adherence; appears was increased to torsemide 120 tid w/ addition of daily metolazone during most recent psych admission. She subsequently was found to be profoundly hypokalemic on most recent medical admission in which her torsemide was held then reintroduced and titrated up to 40 qd w/ addition of spironolactone 50)  -although leg swelling has seemed to overall improve though still ~ 3+ pitting B/L (or at least not noticeably worsen per pt) she has noticed gradual increase in dyspnea on exertion over the past month likely due to under-dosing of diuretic regimen so will increase as above which may require further titration  -given unable  to accurately assess I/Os or daily weights, diuretic titration will largely be dependent on patient's sxs (?more SOB/DOE or edema), vitals (?increased oxygen requirement while awake; ?hypotension), and BMP (if hypokalemic to borderline low K+, would consider increase of spironolactone to 100 qd). Nt-pro-bnp added on to existing labs but will unlikely be of much help (usually low even during exacerbations likely due to obesity). Could check BMP w/ Mg+ ~ qwk while hospitalized (or more frequent if concerning findings- ie hypokalemia; acute kidney injury, etc). Next chemistry ordered for 7/9.    #seizure d/o  -c/w keppra, trileptal    #hypothyroidism  -tsh normal  -c/w levothyroxine    #GERD  -c/w ppi    #chronic pain  #sciatica  -c/w robaxin, baclofen, gabapentin,  suboxone    #?h/o COPD/OSA  -appears does not have COPD; PFTs from 2022 not suggestive of obstruction and no response to bronchodilator. Findings more c/w restrictive physiology but pt unable to tolerate lung volume testing due to claustrophobia. She has been given supplemental oxygen in the past but is likely any intermittent oxygen requirement she's had in the past was more related to CHF in setting of medication non-adherence, undiagnosed OSA, and OHVS.  -may require supplemental oxygen at night for OSA  -consider repeat PFTs as outpatient including lung volume testing   -should have sleep study  -no longer on ics/laba. Would c/w saba prn     The patient has been examined, and the chart has been reviewed,  with findings as documented above.  The patient is medically stable and cleared for admission.      Adriana Simas, PA-C, 03/24/2022

## 2022-03-24 NOTE — PES NOTES (Signed)
Spoke with Erin Sons at [facility] who approved 5 days with review on Monday 7/10 Alessandra Bevels. [phone] Auth # [id number]    Olegario Shearer, LCSW  [name] ext. [phone]

## 2022-03-24 NOTE — Narrator Note (Signed)
Pt ambulatory to the bathroom with steady gait and rollator. 02 sat 93% room air upon return to room. Safety watch maintained.

## 2022-03-24 NOTE — Narrator Note (Signed)
Patient requesting a hospital bed.  States she is in a lot of pain sleeping on the stretcher

## 2022-03-24 NOTE — Narrator Note (Signed)
Report given to Artist. Patient being transferred.

## 2022-03-24 NOTE — ED Notes (Signed)
[facility] EM Attending Sign Out Note    I received signout on this patient from my colleague at shift change.    Here with SI. Pending [name] recs.    Deanna Artis DO MPH  Attending Physician  [facility]

## 2022-03-24 NOTE — Narrator Note (Signed)
Assumed care of patient  Pt medicated per [facility]  In room watching show on phone  Safety watch

## 2022-03-24 NOTE — ED Notes (Signed)
Patient signed out at 91 AM  51 year old presenting with SI/HI.  Patient has no acute medical conditions.  Patient is on a section 12 and is awaiting inpatient psychiatry bed

## 2022-03-24 NOTE — Narrator Note (Addendum)
Pt sitting on stretcher, using cell phone and talking with roommate, redirectable when yelling out requests into the hallway. Gait steady when ambulating to the bathroom with her rollator. Safety watch maintained.

## 2022-03-24 NOTE — Narrator Note (Signed)
Report to RN Manju

## 2022-03-24 NOTE — Narrator Note (Signed)
Hospital bed provided, patient comfortable.

## 2022-03-24 NOTE — Narrator Note (Signed)
Patient ambulated to the bathroom using her walker.

## 2022-03-24 NOTE — Narrator Note (Signed)
Patient sleeping.  Respirations even, unlabored.  Will continue to monitor

## 2022-03-25 ENCOUNTER — Encounter (HOSPITAL_BASED_OUTPATIENT_CLINIC_OR_DEPARTMENT_OTHER): Payer: Self-pay

## 2022-03-25 DIAGNOSIS — F331 Major depressive disorder, recurrent, moderate: Secondary | ICD-10-CM | POA: Diagnosis not present

## 2022-03-25 DIAGNOSIS — F1120 Opioid dependence, uncomplicated: Secondary | ICD-10-CM | POA: Diagnosis not present

## 2022-03-25 DIAGNOSIS — R45851 Suicidal ideations: Secondary | ICD-10-CM | POA: Diagnosis not present

## 2022-03-25 DIAGNOSIS — I5032 Chronic diastolic (congestive) heart failure: Secondary | ICD-10-CM | POA: Diagnosis not present

## 2022-03-25 LAB — MAGNESIUM: MAGNESIUM: 2 mg/dL (ref 1.6–2.6)

## 2022-03-25 LAB — BASIC METABOLIC PANEL
ANION GAP: 18 mmol/L (ref 10–22)
BUN (UREA NITROGEN): 6 mg/dL — ABNORMAL LOW (ref 7–18)
CALCIUM: 9.3 mg/dL (ref 8.5–10.5)
CARBON DIOXIDE: 21 mmol/L (ref 21–32)
CHLORIDE: 96 mmol/L — ABNORMAL LOW (ref 98–107)
CREATININE: 0.6 mg/dL (ref 0.4–1.2)
ESTIMATED GLOMERULAR FILT RATE: >60 mL/min (ref >60)
Glucose Random: 93 mg/dL (ref 74–160)
POTASSIUM: 4.2 mmol/L (ref 3.5–5.1)
SODIUM: 134 mmol/L — ABNORMAL LOW (ref 136–145)

## 2022-03-25 LAB — HEPATIC FUNCTION PANEL
ALANINE AMINOTRANSFERASE: 24 U/L (ref 12–45)
ALBUMIN: 4.1 g/dL (ref 3.4–5.2)
ALKALINE PHOSPHATASE: 184 U/L — ABNORMAL HIGH (ref 45–117)
ASPARTATE AMINOTRANSFERASE: 45 U/L — ABNORMAL HIGH (ref 8–34)
BILIRUBIN DIRECT: <0.2 mg/dL (ref 0.0–0.2)
BILIRUBIN TOTAL: 0.3 mg/dL (ref 0.2–1.0)
TOTAL PROTEIN: 7.2 g/dL (ref 6.4–8.2)

## 2022-03-25 LAB — SERUM DRUG SCREEN
ACETAMINOPHEN: <5 ug/mL (ref 10–30)
ETHANOL: 49 mg/dL — ABNORMAL HIGH (ref 0–10)
SALICYLATE: <0.5 mg/dL (ref 3.0–20.0)

## 2022-03-25 LAB — TSH (THYROID STIMULATING HORMONE): TSH (THYROID STIM HORMONE): 0.636 u[IU]/mL (ref 0.270–4.200)

## 2022-03-25 LAB — LIPASE: LIPASE: 35 U/L (ref 13–60)

## 2022-03-25 LAB — NT-PROBNP: NT-proBNP: 466 pg/mL — ABNORMAL HIGH (ref 0–125)

## 2022-03-25 MED ORDER — DIAZEPAM 5 MG PO TABS
5.0000 mg | ORAL_TABLET | Freq: Three times a day (TID) | ORAL | Status: DC | PRN
Start: 2022-03-28 — End: 2022-03-29

## 2022-03-25 MED ORDER — DIAZEPAM 5 MG PO TABS
7.50 mg | ORAL_TABLET | Freq: Four times a day (QID) | ORAL | Status: AC
Start: 2022-03-25 — End: 2022-03-26
  Administered 2022-03-25 – 2022-03-26 (×4): 7.5 mg via ORAL
  Filled 2022-03-25 (×5): qty 1

## 2022-03-25 MED ORDER — DIAZEPAM 5 MG PO TABS
5.00 mg | ORAL_TABLET | Freq: Four times a day (QID) | ORAL | Status: AC
Start: 2022-03-26 — End: 2022-03-27
  Administered 2022-03-26 – 2022-03-27 (×4): 5 mg via ORAL
  Filled 2022-03-25 (×4): qty 1

## 2022-03-25 MED ORDER — TORSEMIDE 20 MG PO TABS
40.0000 mg | ORAL_TABLET | Freq: Two times a day (BID) | ORAL | Status: DC
Start: 2022-03-25 — End: 2022-03-28
  Administered 2022-03-25 – 2022-03-28 (×6): 40 mg via ORAL
  Filled 2022-03-25 (×6): qty 2

## 2022-03-25 MED ORDER — NICOTINE POLACRILEX 2 MG MT GUM
4.0000 mg | CHEWING_GUM | OROMUCOSAL | Status: DC | PRN
Start: 2022-03-25 — End: 2022-04-07
  Administered 2022-03-25 – 2022-04-07 (×39): 4 mg via ORAL
  Filled 2022-03-25 (×45): qty 2

## 2022-03-25 MED ORDER — DIAZEPAM 5 MG PO TABS
5.00 mg | ORAL_TABLET | Freq: Four times a day (QID) | ORAL | Status: AC | PRN
Start: 2022-03-27 — End: 2022-03-27
  Administered 2022-03-27: 5 mg via ORAL
  Filled 2022-03-25 (×2): qty 1

## 2022-03-25 NOTE — Plan of Care (Addendum)
Pleasant and active this shift with Bingo group.  Social with roommate and pleasant with RN.  Pt with no s/s of ETOH w/d as is on scheduled dose of Valium.  Received nicorette gum as PRN. Ate 80% dinner.  No feelings of self harm while in hospital. 93% O2 at 3L NC. No resp distress. Last BM 03/24/22.

## 2022-03-25 NOTE — Group Note (Signed)
OT Psych Group Documentation    Topics: Project      Occupations Addressed: Leisure, Social participation     Skills addressed: Fine/gross motor skills, process skills, social interaction skills, behavioral control, mental functions                  Date: 03/25/2022  Start Time: 1100  End Time: 1145    Attendance: Declined     Beverley Allender, OT  lic# [id number]

## 2022-03-25 NOTE — Plan of Care (Addendum)
Problem: Fall risk identification/communication (REQUIRED)  Description: REQUIRED if Fall Risk score is 5 or greater, fall score is 76.1; the patient uses an assistive device with ambulation, and is on cardiac and psychotropic medications.  Goal: Patients at risk for falling will be known to staff  Description: Fall risk assessment to be done:       Upon Admission       Transfer from one level of care to another       Whenever there is a change in patient status       After every fall event   Fall prevention precautions are initiated and implemented as per protocol.  Outcome: Progressing    Patient received sleeping in bed with no apparent distress. Alert and oriented x3. O2 sat 94% at 2 litre via nasal canula, Pulse: 84/m. She is on safety checks. Independent BR used. Compliant with morning med, Synthroid. Denied pain. No SOB or respiratory distress noted. No safety concerns. Cheryl Zimmerman slept 6.8hrs. Will continue monitor and maintain safety.

## 2022-03-25 NOTE — Group Note (Signed)
OT Psych Group Documentation    Topics: Breakfast Bites      Occupations Addressed: Activities of Daily living, Social participation, Instrumental activities of daily living (health management & maintenance)     Skills addressed: Fine/gross motor skills, process skills, social interaction skills, behavioral control, mental functions (cognitive, perceptual, affective)                   Date: 03/25/2022  Start Time: 0800  End Time: 0900    Attendance: Attended entire group  Participation/Patient Response: Attentive/able to focus and Contributed to discussion  Participation within a task: Able to complete task independently  Treatment Goals Addressed: Development/maintenance of a health lifestyle, Diagnosis/illness/symptom management, Increased attention/concentration/focus, Increased behavioral control, Increased coping skills, Increased communication skills, Increased socializaton/social skills and Mood/emotion regulation    Comments: Pt. Insistent on request for side of bacon. She was attentive to her meal and able to make her needs known. She consumed about 80% of meal. She made requests for coffee. She complained of peer who began to sing loudly saying she couldn't enjoy her meal, so peer willingly relocated to adjacent room. She appeared familiar with some staff.    [place], [place]  lic# [id number]

## 2022-03-25 NOTE — Initial Assessments (Signed)
Initial Occupational Therapy Assessment:    S) " I felt suicidal, like I can't keep going. I can't get over murder of my son. I've been detoxing."    O) Pt. Observed this morning during Breakfast Bites where she sat with peers and was insistent on getting order of bacon before having any other food. She complained when female peer behing her began to sing to background music so that female peer was asked to transition to adjacent room. Pt. Not seen again until late afternoon where she was sitting calmly in newly changed outfit, hair well groomed, had oxygen tank next to her. She seemed comfortable observing two other peers playing bingo. She was co-operative when this Clinical research associate approached her and re-introduced self, explained OT role and need to update her OT. She was very compliant and was able to identify coping skills that she has been using as reading and writing. She requested politely if she could obtain several books and journal book from her belongings. This Clinical research associate was able to obtain the items for her and in addition provided her with journal from OT as well as writing pen, stress ball. She expressed appreciation for this, tone calm and pleasant.    Patient Goal: "I need a supportive place to live with people around."    Occupational Therapy Evaluation as follows:       03/25/22 1600   Language Information   Language of Care English   Rehab Discipline   Rehab Discipline Psych OT   Services prior to admission?   Type of Home Care Services None   Premorbid Mobility   Transfers Independent   Walking Independent;Household distances only   Stair negotiation Unable   ADL / IADL Baseline Status   ADL/IADL Baseline Status Yes   ADL's/IADL's   Dressing Independent   Bathing Independent  (spongebath only)   Toileting Independent   Household Chores Independent   Premorbid Radiographer, therapeutic with lenses   Premorbid Cognition   Cognition Intact   Premorbid Communication   Communication  Normal   Premorbid Nutrition   Nutrition Normal   Living Situation   Living Setting Homeless/transient   Auditory Comprehension   Commands WFL   Conversation Complex   Perception   Inattention/Neglect Appears intact   Initiation Appears intact   Motor Planning Appears intact   Perseveration Not present   IADL / ADL   IADL/ADL Yes   Daily Functioning   Coping Skills Being in nature;Creative arts;Going to a quiet place;Identifies/engages in unhealthy coping skills;Reading;Watching TV/movies       ;Journaling;Medication   Interpersonal Approachable;Cooperative;Engagable upon approach;Assertive   Leisure/Interests Creative arts     ;Cooking;Lacks positive social supports/resources;Reading;Watching TV/movies;Writing    Responsibilities/Structures Has no current day structure   Strengths Accepts supports;Asks for help/support     ;Goal directed;Honest   Warden/ranger;Olfactory;Tactile   Auditory listen to music, sound of rain, thunderstorm, Human resources officer Sensitivities   Auditory loud yelling   Cognition   Attention [facility]   Decision Making Impaired   Follows Commands WFL   Frustration Tolerance Impaired   Memory WFL   Problem Solving UTA   Processing Skills Impaired   Judgement X   Insight Fair   Impulse Control Impaired   Orientation Level Oriented x3   Task Initiation Impaired   Task Completion Impaired   Flexibility of Thought Impaired  Planning Skills Impaired   Organizational Skills Impaired   Processing Speed [facility]   Perseveration Not present   Working Memory [facility]   Emotional Regulation Impaired   Coping/Life Skills Impaired   Risk Areas   Risk Areas Aggressive behavior;Assaultive behavior;Death;Losses ;History of hospitalization;Homicidality;Lack of support(s);Personal/family illness;Restraint;Seclusion;Self-injurious behaviors;Substance abuse;Suicidality;Threatening behavior       ;Trauma history (physical/emotional/sexual)         Assessment: Pt. Is well known to [facility] from prior admissions. She is presenting with suicidal and homocidal ideation in context of homelessness, lack of supports, substance abuse and on going medical issues. Pt. Has PMH of PTSD, PSUD on suboxone, Borderline personality disorder, AUD, Schioaffective d/o, MDD, SI, SIB, COPD, CHF, chronic pain, seizure d/o, morbid obesity. Currently she is in good behavioral control and receptive to OT services and on going treatment. She is able to identify some coping skills tha are useful to her. She continues to present with impaired coping skills and vulnerable to emotional dysregulation.   Pt.will benefit from skilled individual and group occupational therapy interventions to maximize participation in meaningful and reality based activities, to develop increased coping skills and self-regulation strategies to promote adaptive responses to stress/distress, to promote insight, to promote ADLs, and to improve safety in the community.     OTGOALS:  STG: Pt. Will actively utilize current coping skills of reading and journaling daily and will attend OT groups (M-F) to promote use of coping skills and behavior, by 04/08/22  LTG: Pt. Will by 04/21/22 be able to consistently utilize her preferred coping skills and generalize to future residence.  P:           Pt should be encouraged to participate in all groups and milieu activities. OT to evaluate need for alternative treatment if appropriate.  Pt to receive orientation, psycho-education, and practice with sensory tools and items available on the unit to enhance opportunities to develop self-regulation strategies.  OT to continue to assess pt's functional, sensory, self-regulation, social, and cognitive skills, and to determine strengths and limitations in relation to pt's participation in valued occupations and in maintaining safety.           [address] OT, Lic # [id number]

## 2022-03-25 NOTE — Progress Notes (Signed)
Social Work Intake Note            Social work Plan: Patient wants to go to a rest home at discharge. His writer will work on rest home placements.     Collaterals  Dione Plover caseworker from [employer] [phone]  Email: [email or web address]    Barriers to Discharge:  -On oxygen  -Homeless    Treatment Plan:  -Meet with patient daily to assess mood and monitor progress.  -Speak with collaterals to gain collateral information.  -Encourage patient to engage in the groups while in the unit.  -Evaluate living situation and support system.  -Coordinate treatment and discharge planning with the outpatient team.     H&P Per      History of Present Illness: 30F w/ PMHx HFpEF, seizure disorder, hypothyroidism, ?COPD/OSA, GERD, chronic pain, spinal stenosis w/ sciatica, polysubstance use, schizoaffective disorder bipolar type, PTSD, and SI p/w SI/HI, admitted to [facility] on 7/6.    Reports gradual increase of SOB/DOE over the past month. Has not noticed leg swelling getting worse and thinks overall they look better. Dry cough over past month. No fevers. No wheezing. No chest or abd pain. Last emetic episode related to EtOH withdrawal was just PTA. Was drinking ~ 2 liters of liquor per day. Normal BM yesterday. C/o excessive day time fatigue even when not usuing supplemental oxygen (likely related to OSA). Has never had sleep study but is open to it.           Past Medical History:     Past Medical History:     schizo: Bipolar affective ([name])  No date: Chronic systolic CHF (congestive heart failure) ([name])  No date: Depression  12/23/2021: Fall in home  12/23/2021: Gait instability  12/23/2021: Requires continuous at home supplemental oxygen  depress: Schizo affective schizophrenia ([name])  12/23/2021: Spinal stenosis of lumbar 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   claudication  No date: Substance abuse [facility])       Past Surgical History:   No past surgical history on file.          Medications prior to Admission:   Prior to Admission  Medications   Prescriptions Last Dose Informant Patient Reported? Taking?   FLUoxetine (PROZAC) 20 MG capsule   No No   Sig: Take 1 capsule by mouth in the morning.   OTHER MEDICATION   No No   Sig: Wheelchair: Electric  For use as directed.    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         Begin with 2 LPM and monitor oxygen saturation using a pulse oximeter.  2.         If oxygen saturation is below 90%, increase the flow rate by 0.5 LPM every 15-30 minutes until the desired saturation level of 90%-95% is achieved.  3.         If oxygen saturation is above 95%, decrease the flow rate by 0.5 LPM every 15-30 minutes until the desired saturation level of 90%-95% is achieved.  4.         Once the desired saturation level is achieved, maintain the flow rate at that level.    DX: COPD with Hypoxia, Requires continuous supplemental oxygen  ICD10: Code: J44.9 and  Z99.81    SIGNED Georgianne Fick, CNP, 01/05/2022    Nurse Practitioner   Hospitalist Division  [facility]  Units [place] [facility] and [facility]  Phone:[phone] (fastest) or [phone]   Email:[email or web address]     NPI: [id number]   OXcarbazepine (TRILEPTAL) 150 MG tablet   No No   Sig: Take 1 tablet by mouth in the morning and 1 tablet before bedtime.   QUEtiapine (SEROQUEL) 50 MG tablet   No No   Sig: Take 2 tablets by mouth at bedtime   albuterol HFA 108 (90 Base) MCG/ACT inhaler   No No   Sig: Inhale 2 puffs into the lungs every 6 (six) hours as needed for Wheezing   atorvastatin (LIPITOR) 80 MG tablet   No No   Sig: Take 1 tablet by mouth in the morning.   budesonide-formoterol (SYMBICORT) 160-4.5 MCG/ACT inhaler   No No   Sig: Inhale 2 puffs into the lungs in the morning and 2 puffs before bedtime.   gabapentin (NEURONTIN) 300 MG capsule   No No    Sig: Take 2 capsules by mouth in the morning and 2 capsules at noon and 2 capsules before bedtime.   levETIRAcetam (KEPPRA) 750 MG tablet   No No   Sig: Take 1 tablet by mouth in the morning and 1 tablet before bedtime.   levothyroxine (SYNTHROID) 25 MCG tablet   No No   Sig: Take 1 tablet by mouth every morning before breakfast   loxapine (LOXITANE) 25 MG capsule   No No   Sig: Take 1 capsule by mouth in the morning and 1 capsule before bedtime.   prazosin (MINIPRESS) 1 MG capsule   No No   Sig: Take 3 capsules by mouth nightly   spironolactone (ALDACTONE) 50 MG tablet   No No   Sig: Take 1 tablet by mouth in the morning.   torsemide 40 MG TABS   No No   Sig: Take 40 mg by mouth in the morning.      Facility-Administered Medications: None        Allergies:     Review of Patient's Allergies indicates:      Bee venom               Anaphylaxis   Methylprednisolone      Dizziness, Drowsiness    Comment:Also believes syncope. Seems to tolerate             inhalational and epidural steroid.   Nutritional supplem*       Hydroxyzine             Nausea Only       Social History:  Tobacco Use: yes  Alcohol: yes    Family History:   Mother and father have no hx of CAD.    Review of Systems: No fevers, SOB, CP, abd pain, n/v/d, no focal weakness.   All other organ systems are reviewed and are negative.    Physical Exam:  Vital Signs - Last 8 Hours:   BP: (115-121)/(74-81)   Temp:  [97.5 F (36.4 C)-97.7 F (36.5 C)]   Pulse:  [85-87]   Resp:  [18-20]   SpO2:  [95 %-96 %]     GEN: NAD, sitting in chair  EENT: EOMI, clear sclera  Lungs: CTAB, respirations unlabored  Heart: RRR  Abd: Soft, non-tender, non-distended  Neuro: No focal deficits, alert  Skin: warm, dry  Extremities: 3+ pitting edema B/L LE  Psych: normal affect    Cranial Nerves:  II: PERRLA  III, IV, VI: EOMs intact b/l without nystagmus  V, VII: facial strength and sensation intact and symmetric  VIII: Intact to voice  IX, X: palatal  elevation symmetrical  XI: Shoulder shrug symmetrical and strength intact  XII: tongue midline without fasciculations      Recent Labs:  BMP:       Recent Labs     03/23/22  0929   NA 134*   K 4.2   CL 96*   CO2 21   BUN 6*   CREAT 0.6   GLUCOSER 93     Ca, Mg, Phos:   Recent Labs     03/23/22  0929   CA 9.3   MG 2.0     CBC:       Recent Labs     03/23/22  0929   WBC 11.9*   HCT 46.2*   PLTA 383   RBC 5.05     Coagulation Labs: No results for input(s): PT, INR in the last 72 hours.    Invalid input(s): PTT  LFTs:       Recent Labs     03/23/22  0929   AST 45*   ALT 24   TBILI 0.3   DBILI < 0.2   ALKPHOS 184*     Pancreatic Enzymes:       Recent Labs     03/23/22  0929   LIP 35     Urinalysis:No results for input(s): UACOL, UACLA, UAGLU, UABIL, UAKET, SPEGRAVURINE, UAOCC, UAPH, UAPRO, UANIT, LEUKOCYTES in the last 72 hours.    Invalid input(s): UAOBU  Troponin: No results for input(s): TROPI in the last 72 hours.    Imaging:     XR Chest Portable    Result Date: 03/23/2022    TECHNIQUE: Portable chest, 10:43 a.m.  Indication: eval for pulm edema sat 93% Comparison: 01/11/2022 FINDINGS: Quality: Satisfactory.   Tubes/lines: None. Lungs: The lungs appear clear. Pleura: There is no pleural effusion or pneumothorax. Heart: The cardiac silhouette is unchanged. Mediastinum/hila: Unremarkable. Bones and Soft Tissues: There is convex right curvature of the thoracic spine. There is right glenohumeral degenerative joint disease. A metallic density is redemonstrated overlying the cervical spine.       No acute cardiopulmonary findings on portable chest radiograph.  Reviewed and Electronically Signed By: Rosine Abe, MD Signed Date and Time: 03/23/2022 10:54 AM        Assessment and Plan  18F w/ PMHx HFpEF, seizure disorder, hypothyroidism, ?COPD/OSA, GERD, chronic pain, spinal stenosis w/ sciatica, polysubstance use, schizoaffective disorder bipolar type, PTSD, and SI p/w SI/HI, admitted to [facility] on  7/6.    #polysubstance use  -etoh, opioids, cocaine, tobacco  -etoh 49 on sds  -bmp unremarkable  -on valium taper (initially on ciwa)  -discuss cessation and MAT (acamprosate, suboxone, nicotine, etc)  -appears has been on suboxone 8-2 tid since at least 2021 (most recent fill per PDMP 6/28 for 7 d Rx); would continue this while hospitalized. Also on gabapentin which may help w/ etoh abstinence. Could consider acamprosate but would unlikely be of much benefit and may increase risk of other med non-adherence due to polypharmacy. Could consider topamax for etoh cessation. C/w nicotine patch and gum (pt requesting 2 pieces of gum at a time as she smokes ~3 ppd)    #HFpEF  -no acute exacerbation though c/f increasing hypervolemia as outlined below. CXR without acute abnormality. Anticipate  will have desat on room air while sleeping due to OSA (using supplemental O2 here during sleep or when feeling SOB- do not see any documented sig desat while awake)  -EF 70% March 2023   -c/w torsemide-->will increase to 40 bid, spironolactone  -previously on up to torsemide 120 tid w/ metolazone 5 qd (seemed to be tolerating torsemide 120 bid well though there was question of medication adherence; appears was increased to torsemide 120 tid w/ addition of daily metolazone during most recent psych admission. She subsequently was found to be profoundly hypokalemic on most recent medical admission in which her torsemide was held then reintroduced and titrated up to 40 qd w/ addition of spironolactone 50)  -although leg swelling has seemed to overall improve though still ~ 3+ pitting B/L (or at least not noticeably worsen per pt) she has noticed gradual increase in dyspnea on exertion over the past month likely due to under-dosing of diuretic regimen so will increase as above which may require further titration  -given unable to accurately assess I/Os or daily weights, diuretic titration will largely be dependent on patient's sxs  (?more SOB/DOE or edema), vitals (?increased oxygen requirement while awake; ?hypotension), and BMP (if hypokalemic to borderline low K+, would consider increase of spironolactone to 100 qd). Nt-pro-bnp added on to existing labs but will unlikely be of much help (usually low even during exacerbations likely due to obesity). Could check BMP w/ Mg+ ~ qwk while hospitalized (or more frequent if concerning findings- ie hypokalemia; acute kidney injury, etc). Next chemistry ordered for 7/9.    #seizure d/o  -c/w keppra, trileptal    #hypothyroidism  -tsh normal  -c/w levothyroxine    #GERD  -c/w ppi    #chronic pain  #sciatica  -c/w robaxin, baclofen, gabapentin, suboxone    #?h/o COPD/OSA  -appears does not have COPD; PFTs from 2022 not suggestive of obstruction and no response to bronchodilator. Findings more c/w restrictive physiology but pt unable to tolerate lung volume testing due to claustrophobia. She has been given supplemental oxygen in the past but is likely any intermittent oxygen requirement she's had in the past was more related to CHF in setting of medication non-adherence, undiagnosed OSA, and OHVS.  -may require supplemental oxygen at night for OSA  -consider repeat PFTs as outpatient including lung volume testing   -should have sleep study  -no longer on ics/laba. Would c/w saba prn    The patient has been examined, and the chart has been reviewed, with findings as documented above.  The patient is medically stable and cleared for admission.

## 2022-03-25 NOTE — RN Shift Note (Addendum)
2200 Valium held at this time as pt is sleeping and received Valium as PRN at 2012 per her request and CIWA score 10 with overt anxiety, somewhat agitated, diaphoretic, and complaints she was in ETOH withdrawl earlier in the shift.  Discussed same with Dr Serina Cowper.

## 2022-03-25 NOTE — Progress Notes (Signed)
51 yo homeless female with Hx of PTSD, and bipolar affective DO admitted to [facility] on 03-24-2022.  She was BIBA from home due to +SI/HI for the past 24 hours.  She drank the morning of ER presentation due to etoh withdrawal.  BAL was 49, tox screen + cocaine.  "I'm having a really hard time. My son was murdered 1 1/2 year ago right in front of me and right now I'm feeling very suicidal and homicidal. I also need detox because I want to stop."  CV signed, adjusting to the unit.  Insurance is [employer], SW does the reviews.

## 2022-03-25 NOTE — Multidisciplinary Treatment Plan of Care (Signed)
Treatment Plan Goal Progress Note  Cheryl Zimmerman    Admission date: 03/24/22      Goal Progress/Outcomes       Cheryl Zimmerman's individual stated goal: improvement of mood    Cheryl Zimmerman reports goal NOT progressing as of today. The team will continue to assist the patient with the progression of their goal.    Care Plan Problems/Goals     0 of 5 Goals Met 0 of 5 Met     Progressing (3)      Patients at risk for falling will be known to staff (Fall risk identification/communication (REQUIRED))    Disciplines:  Interdisciplinary    Expected end:  04/24/22         Outcome: Progressing Documented by Durward Fortes, RN on 03/25/22 [phone]                Demonstrates effective coping during hospitalization (Coping Ineffective, Individual)    Disciplines:  Interdisciplinary    Expected end:  04/24/22         Outcome: Progressing Documented by Deeann Dowse, Clydell Hakim, RN on 03/24/22 2245                Demonstrates/reports adjustment to illness/condition during hospitalization (Coping Ineffective, Individual)    Disciplines:  Interdisciplinary    Expected end:  04/24/22         Outcome: Progressing Documented by Deeann Dowse, Hercule, RN on 03/24/22 2245                             No Outcome (2)      Risk control - tobacco abuse (Tobacco/Nicotine Abuse)    Disciplines:  Nurse    Expected end:  03/27/22           Maintain safety while medical bed in use (Medical Bed)    Disciplines:  NURSE    Expected end:  04/24/22                           Additional Patient-Centered Interventions       To support the Goals outlined above, the team will:    Physician  use evidenced-based psychopharmacology     Social Work  coordinate discharge throughout hospitalization        Rehabilitation Therapies  OT will Provide education and assessment around sensory preferences/sensitivities.

## 2022-03-25 NOTE — Group Note (Signed)
OT Psych Group Documentation    Topics: Feel Good      Occupations Addressed: Activities of daily living (functional mobility), Instrumental activities of daily living (health management & maintenance), Leisure, Social participation      Skills addressed: Fine/gross motor skills, social interaction skills, process skills, emotional regulation, mental functions                Date: 03/25/2022  Start Time: 1015  End Time: 1100    Attendance: Declined     Nygeria Lager, OT  lic# [id number]
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
## 2022-03-25 NOTE — Progress Notes (Signed)
Patient has been visible in the milieu, uses a rollator for ambulation, gait steady anxious at times, cooperative. She took her medications as prescribed. Attended groups. She ate 80% of her meals. No s/sx of alcohol withdrawal. Continues on oxygen therapy, O2 sat 96% on 2L via NC, no SOB reported. No complaints of physical discomfort. No behavioral issues. Denies SI/HI/AH/VH. No safety concerns. Assisted as needed.   BP 109/67   Pulse 90   Temp 98.2 F (36.8 C)   Resp 18   Ht 5\' 9"  (1.753 m)   Wt (!) 156.9 kg (346 lb)   SpO2 96%   BMI 51.10 kg/m

## 2022-03-25 NOTE — RN Shift Note (Signed)
Pt awake now and has requested nicorette gum.  As pt is awake given Valium per order.

## 2022-03-26 DIAGNOSIS — R45851 Suicidal ideations: Secondary | ICD-10-CM | POA: Diagnosis not present

## 2022-03-26 DIAGNOSIS — F1120 Opioid dependence, uncomplicated: Secondary | ICD-10-CM | POA: Diagnosis not present

## 2022-03-26 DIAGNOSIS — I5032 Chronic diastolic (congestive) heart failure: Secondary | ICD-10-CM | POA: Diagnosis not present

## 2022-03-26 DIAGNOSIS — F331 Major depressive disorder, recurrent, moderate: Secondary | ICD-10-CM | POA: Diagnosis not present

## 2022-03-26 MED ORDER — DIAZEPAM 5 MG PO TABS
5.00 mg | ORAL_TABLET | Freq: Once | ORAL | Status: AC
Start: 2022-03-26 — End: 2022-03-26
  Administered 2022-03-26: 5 mg via ORAL

## 2022-03-26 MED ORDER — OXCARBAZEPINE 150 MG PO TABS
300.0000 mg | ORAL_TABLET | Freq: Three times a day (TID) | ORAL | Status: DC
Start: 2022-03-26 — End: 2022-04-07
  Administered 2022-03-26 – 2022-04-07 (×36): 300 mg via ORAL
  Filled 2022-03-26 (×36): qty 2

## 2022-03-26 NOTE — Group Note (Signed)
OT Psych Group Documentation    Topics: Breakfast Bites      Occupations Addressed: Activities of Daily living, Social participation, Instrumental activities of daily living (health management & maintenance)     Skills addressed: Fine/gross motor skills, process skills, social interaction skills, behavioral control, mental functions (cognitive, perceptual, affective            Date: 03/26/2022  Start Time: 0800  End Time: 0845    Attendance: Attended entire group  Participation/Patient Response: Interactive with others, Intrusive/disruptive behavior and Unresponsive to cues/limits/redirection from staff  Participation within a task: Able to complete task independently, Demonstrated ability to make decisions and Difficulty with frustration tolerance  Treatment Goals Addressed: Development/maintenance of a health lifestyle, Increased attention/concentration/focus, Increased communication skills, Increased independence in functioning, Increased socializaton/social skills and Verbalization of needs    Comments: Loud and demanding. Reports that she understands nursing staff have multiple pts, however continues to insist that she should be first all of the time so that she does not have to wait. Did not receive bacon on her tray - kitchen notified.     Laverda Sorenson, OT  lic# [id number]

## 2022-03-26 NOTE — Plan of Care (Signed)
Patient alert and responsive received in bed in no acute distress, continues on Ciwa protocol scored 1 this shift no coverage or PRN medication needed or requested patient on oxygen therapy sat WNL, text paged on psych for oxygen order, no SOB or respiratory distress, denies pain and discomfort, SI/HI/AVH, patient is currently sitting in the dining room dozing on and off in stable condition, BP 93/62   Pulse 80   Temp 97.9 F (36.6 C)   Resp 18   Ht 5\' 9"  (1.753 m)   Wt (!) 147.5 kg (325 lb 3 oz)   SpO2 92%   BMI 48.02 kg/m c/o headache 650 mg of tylenol giving at 0701 with good po intake, and nicorette gums for craving, Ciwa scored zero at 0700, patient is very drowsy, safety precaution maintained, will continue with plan of care. Slept 8.4 hrs, LBM 03/24/22.

## 2022-03-26 NOTE — Plan of Care (Addendum)
Pt has been visible in the common areas at intervals. easily frustrated, irritable and anxious frequently with multiple requests that need redirection. No unsafe behavior was exhibited . VSS O2 sat WNL on R/A  Not compliant wearing her oxygen was discussed with MD . Pt took med's as ordered. Including scheduled valium , tolerated well with decreased anxiety.Nicorett gum for cigarette craving given with effects. Pt ate 100% breakfast and lunch. Pt denies SI AH VH SI . Pt declined group The patient was offered the following alternatives to group attendance: individual contact with staff and journaling. Using the tablet to watch movies . Coping skills were encouraged. Will continue to assess pt and monitor for safety  Last BM 7/6

## 2022-03-26 NOTE — Progress Notes (Signed)
Identifying Data:  [name].    INTERVAL HISTORY: According to nursing report and/or patient and/or MAR, no major behavioral or medical events overnight, has appropriate interactions with other patients and staff, compliant with medications, good sleep and appetite.     VITALS:  BP 124/84   Pulse 83   Temp 96.2 F (35.7 C)   Resp 18   Ht 5\' 9"  (1.753 m)   Wt (!) 147.5 kg (325 lb 3 oz)   SpO2 97%   BMI 48.02 kg/m    Pain Score: 8 (8/10)    Scheduled Meds:  . OXcarbazepine  300 mg Oral TID   . diazepam  5 mg Oral 4x Daily AC & HS   . torsemide  40 mg Oral BID   . buprenorphine-naloxone  8 mg Sublingual TID   . methocarbamol  500 mg Oral TID   . spironolactone  50 mg Oral Daily   . polyethylene glycol  17 g Oral Daily   . sennosides  17.2 mg Oral Nightly   . levETIRAcetam  750 mg Oral BID   . baclofen  10 mg Oral BID   . prazosin  1 mg Oral Nightly   . gabapentin  600 mg Oral TID   . levothyroxine  25 mcg Oral DAILY   . nicotine  1 patch Transdermal Daily   . pantoprazole  40 mg Oral Daily       PRN Meds:  Current Facility-Administered Medications   Medication Dose Route Frequency Last Admin   . [START ON 03/27/2022] diazepam  5 mg Oral Q6H PRN     . [START ON 03/28/2022] diazepam  5 mg Oral Q8H PRN     . nicotine polacrilex  4 mg Oral Q2H PRN 4 mg at 03/26/22 2118   . acetaminophen  650 mg Oral Q4H PRN 650 mg at 03/26/22 0701   . magnesium hydroxide  30 mL Oral Daily PRN     . aluminum-magnesium hydroxide-simethicone  30 mL Oral Q2H PRN     . OLANZapine zydis  5 mg Oral Q6H PRN     . traZODone  50 mg Oral Nightly PRN     . albuterol HFA  2 puff Inhalation Q6H PRN     . diazepam  10 mg Oral Q2H PRN 10 mg at 03/25/22 2012         Mental Status Examination Notable Findings:  General appearance: acceptable self care, appropriate clothing. Behavior: cooperative.  Alertness: awake, alert.   Memory and orientation: at least grossly intact, oriented to place, time, situation.  Speech is fluent,  coherent, with age-appropriate rate, prosody, volume. Vocabulary was not assessed.  Mannerism/movements: Psychomotor activities are normal for age; no mannerisms.  Thought process is logical, linear.   Thought content and delusions: Not obviously paranoid, there is no formal thought disorder.   Hallucinations: Not distracted or preoccupied.  Mood: Depressed and anxious. Affect is constricted but reactive.   No suicidal ideation; future oriented.   No homicidal thoughts.  Insight and judgment are limited.      Review of Patient's Allergies indicates:   Bee venom               Anaphylaxis   Methylprednisolone      Dizziness, Drowsiness    Comment:Also believes syncope. Seems to tolerate             inhalational and epidural steroid.   Nutritional supplem*  Hydroxyzine             Nausea Only      C-SSRS:  C-SSRS Frequent Screener  Since last contact, have you actually had thoughts about killing yourself ? : No  Have you been thinking about how you might do this? : No  Have you had these thoughts and had some intention of acting on them? : No  Have you started to work out or worked out the details of how to kill yourself? Do you intend to carry out this plan?: No  Have you done anything, started to do anything, or prepared to do anything to end your life? : No  C-SSRS Frequent Screener Risk Score: No Risk    C-SSRS Risk Assessment  Suicidal and Self-Injurious Behavior (Past 3 Months):  (denies) (03/25/22 1245)  Suicidal and Self-Injurious Behavior (Lifetime):  (denies) (03/25/22 1245)  Suicidal Ideation Check Most Severe in Past Month:  (denies) (03/25/22 1245)  Activating Events (Recent): Current or pending isolation or feeling alone (03/25/22 1245)  Treatment History: Previous psychiatric diagnoses and treatments;Hopeless or dissatisfied with treatment;Non-compliant with treatment (03/25/22 1245)  Protective Factors (Recent): Identifies reasons for living;Fear of death or dying due to pain and suffering  (03/25/22 1245)  Any suicidal, self-injurious, or aggressive behaviors? : Yes (03/25/22 1245)  Describe any suicidal, self-injurious or aggressive behavior (INCLUDE DATES): had a serious suicidal attempt after her son was mordered 15 years ago, some cutting behavior after that (03/25/22 1245)      Assessment and plan:    Primary diagnosis:  Borderline personality disorder     Continue current treatment.      Antipsychotic consent reviewed for patients already on antipsychotics, obtained if new antipsychotic is started.    I spent 25 minutes total, including non-face to face time (as reviewing records and documenting) on this date with this patient's care.

## 2022-03-26 NOTE — Plan of Care (Addendum)
Pt noted to be active in [facility] group but mostly to self using tablet.  Not as many requests as noted earlier today. Given PRN nicorette gum per request. States she is still very depressed and has suicidal feelings and is not ready for discharge. Ate 100% dinner. Last BM 03/24/22. CIWA 0 though is on Valium taper.  Does get mildly irritable but can be soothed with a gentle approach. Later in shift pt with concerns she is not on the same meds she was on before hospitalization.  Pt had evidently talked to MD about same because when RN indicated she can address this with the MD she said he said she will have to talk to the covering MD.  Pt resistant to ideal RN can not make this happed before pt sees covering MD on Monday.

## 2022-03-26 NOTE — RN Shift Note (Signed)
Pt c/o anxiety and inability to sleep. Pt given Valium 5 mg po per pt request.

## 2022-03-27 DIAGNOSIS — F1120 Opioid dependence, uncomplicated: Secondary | ICD-10-CM | POA: Diagnosis not present

## 2022-03-27 DIAGNOSIS — R45851 Suicidal ideations: Secondary | ICD-10-CM | POA: Diagnosis not present

## 2022-03-27 DIAGNOSIS — F331 Major depressive disorder, recurrent, moderate: Secondary | ICD-10-CM | POA: Diagnosis not present

## 2022-03-27 DIAGNOSIS — I5032 Chronic diastolic (congestive) heart failure: Secondary | ICD-10-CM | POA: Diagnosis not present

## 2022-03-27 LAB — PHOSPHORUS MAGNESIUM
MAGNESIUM: 2 mg/dL (ref 1.6–2.6)
PHOSPHORUS: 3.6 mg/dL (ref 2.5–4.9)

## 2022-03-27 LAB — BASIC METABOLIC PANEL
ANION GAP: 11 mmol/L (ref 10–22)
BUN (UREA NITROGEN): 16 mg/dL (ref 7–18)
CALCIUM: 9.3 mg/dL (ref 8.5–10.5)
CARBON DIOXIDE: 30 mmol/L (ref 21–32)
CHLORIDE: 97 mmol/L — ABNORMAL LOW (ref 98–107)
CREATININE: 0.8 mg/dL (ref 0.4–1.2)
ESTIMATED GLOMERULAR FILT RATE: >60 mL/min (ref >60)
Glucose Random: 116 mg/dL (ref 74–160)
POTASSIUM: 3.9 mmol/L (ref 3.5–5.1)
SODIUM: 138 mmol/L (ref 136–145)

## 2022-03-27 LAB — EKG

## 2022-03-27 MED ORDER — HALOPERIDOL 10 MG PO TABS
10.00 mg | ORAL_TABLET | Freq: Once | ORAL | Status: AC
Start: 2022-03-27 — End: 2022-03-27
  Administered 2022-03-27: 10 mg via ORAL
  Filled 2022-03-27: qty 1

## 2022-03-27 NOTE — Progress Notes (Signed)
Identifying Data:  [name].    INTERVAL HISTORY: According to nursing report and MAR, no major behavioral or medical events overnight, slept poorly, eats and takes medications.  Na improved to 138 from 134 on 7/5.    VITALS:  BP 110/78   Pulse 76   Temp 97.1 F (36.2 C)   Resp 12   Ht 5\' 9"  (1.753 m)   Wt (!) 147.5 kg (325 lb 3 oz)   SpO2 96%   BMI 48.02 kg/m    Pain Score: 0 (0/10)    Scheduled Meds:  . OXcarbazepine  300 mg Oral TID   . torsemide  40 mg Oral BID   . buprenorphine-naloxone  8 mg Sublingual TID   . methocarbamol  500 mg Oral TID   . spironolactone  50 mg Oral Daily   . polyethylene glycol  17 g Oral Daily   . sennosides  17.2 mg Oral Nightly   . levETIRAcetam  750 mg Oral BID   . baclofen  10 mg Oral BID   . prazosin  1 mg Oral Nightly   . gabapentin  600 mg Oral TID   . levothyroxine  25 mcg Oral DAILY   . nicotine  1 patch Transdermal Daily   . pantoprazole  40 mg Oral Daily       PRN Meds:  Current Facility-Administered Medications   Medication Dose Route Frequency Last Admin   . diazepam  5 mg Oral Q6H PRN 5 mg at 03/27/22 1701   . [START ON 03/28/2022] diazepam  5 mg Oral Q8H PRN     . nicotine polacrilex  4 mg Oral Q2H PRN 4 mg at 03/27/22 1701   . acetaminophen  650 mg Oral Q4H PRN 650 mg at 03/26/22 0701   . magnesium hydroxide  30 mL Oral Daily PRN     . aluminum-magnesium hydroxide-simethicone  30 mL Oral Q2H PRN     . OLANZapine zydis  5 mg Oral Q6H PRN     . traZODone  50 mg Oral Nightly PRN 50 mg at 03/27/22 0340   . albuterol HFA  2 puff Inhalation Q6H PRN     . diazepam  10 mg Oral Q2H PRN 10 mg at 03/25/22 2012         Mental Status Examination Notable Findings:  General appearance: unkempt, overweight middle aged woman. Behavior: demanding  Alertness: awake, alert.   Memory and orientation: at least grossly intact, oriented to place, time, situation.  Speech is fluent, coherent, pressured, very loud. Vocabulary was not assessed.  Mannerism/movements:  Psychomotor activities are normal for age; no mannerisms.  Thought process is with flight of idea.   Thought content and delusions: Not obviously paranoid, there is no formal thought disorder.   Hallucinations: Not distracted or preoccupied.  Mood: Depressed and angry. Affect is blunted.   No suicidal ideation; worried about her future.   No homicidal thoughts.  Insight and judgment are limited, wants Ambien      Review of Patient's Allergies indicates:   Bee venom               Anaphylaxis   Methylprednisolone      Dizziness, Drowsiness    Comment:Also believes syncope. Seems to tolerate             inhalational and epidural steroid.   Nutritional supplem*       Hydroxyzine  Nausea Only      C-SSRS:  C-SSRS Frequent Screener  Since last contact, have you actually had thoughts about killing yourself ? : No  Have you been thinking about how you might do this? : No  Have you had these thoughts and had some intention of acting on them? : No  Have you started to work out or worked out the details of how to kill yourself? Do you intend to carry out this plan?: No  Have you done anything, started to do anything, or prepared to do anything to end your life? : No  C-SSRS Frequent Screener Risk Score: No Risk    C-SSRS Risk Assessment  Suicidal and Self-Injurious Behavior (Past 3 Months):  (denies) (03/25/22 1245)  Suicidal and Self-Injurious Behavior (Lifetime):  (denies) (03/25/22 1245)  Suicidal Ideation Check Most Severe in Past Month:  (denies) (03/25/22 1245)  Activating Events (Recent): Current or pending isolation or feeling alone (03/25/22 1245)  Treatment History: Previous psychiatric diagnoses and treatments;Hopeless or dissatisfied with treatment;Non-compliant with treatment (03/25/22 1245)  Protective Factors (Recent): Identifies reasons for living;Fear of death or dying due to pain and suffering (03/25/22 1245)  Any suicidal, self-injurious, or aggressive behaviors? : Yes (03/25/22 1245)  Describe  any suicidal, self-injurious or aggressive behavior (INCLUDE DATES): had a serious suicidal attempt after her son was mordered 15 years ago, some cutting behavior after that (03/25/22 1245)      Assessment and plan:    Primary diagnosis:  Borderline personality disorder   Continue current treatment.      Antipsychotic consent reviewed for patients already on antipsychotics, obtained if new antipsychotic is started.    I spent 25 minutes total, including non-face to face time (as reviewing records and documenting) on this date with this patient's care.

## 2022-03-27 NOTE — Plan of Care (Addendum)
Problem: Tobacco/Nicotine Abuse  Description: Encourage smoking cessation at every encounter with the patient and provide for assistance as needed and per [facility] guidelines.    Pt has been visible in the common areas using a Rolator walker with a steady gait and less anxious in better control this shift .No unsafe behavior was exhibited . VSS O2 sat WNL . Pt took med's as ordered. Including scheduled valium , tolerated well  . No S/S of detox .Pt ate 100% breakfast and lunch. Pt denies SI AH VH SI . Pt declined group The patient was offered the following alternatives to group attendance: individual contact with staff and journaling. Using the tablet to watch movies . Coping skills were encouraged. Will continue to assess pt and monitor for safety Last BM 7/8

## 2022-03-27 NOTE — Plan of Care (Signed)
Problem: Fall risk identification/communication (REQUIRED)  Description: REQUIRED if Fall Risk score is 5 or greater, fall score is 76.1; the patient uses an assistive device with ambulation, and is on cardiac and psychotropic medications.  Goal: Patients at risk for falling will be known to staff  Description: Fall risk assessment to be done:       Upon Admission       Transfer from one level of care to another       Whenever there is a change in patient status       After every fall event   Fall prevention precautions are initiated and implemented as per protocol.  Outcome: Progressing    Patient received sleeping in bed with no apparent distress. Alert and easily irritable on approach. She is on safety checks. Requested lotion for dry skin bilateral lower limb, applied. At 3:40am requested PRN Trazodone for sleep, received with good effects. Ate snacks saltine cracker and gingerale. O2 sat 93% at 2 litre via nasal canula, P: 77/m. Independent BR used. Came out to dining room this morning, received PRN Nicorette gum as per request. Compliant with med. Denied pain. No SOB or respiratory distress noted. No safety concerns.  Chassidy slept 4.2hrs. Will continue monitor and maintain safety checks.
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
## 2022-03-28 DIAGNOSIS — I5032 Chronic diastolic (congestive) heart failure: Secondary | ICD-10-CM | POA: Diagnosis not present

## 2022-03-28 DIAGNOSIS — R45851 Suicidal ideations: Secondary | ICD-10-CM | POA: Diagnosis not present

## 2022-03-28 DIAGNOSIS — F1120 Opioid dependence, uncomplicated: Secondary | ICD-10-CM | POA: Diagnosis not present

## 2022-03-28 DIAGNOSIS — F331 Major depressive disorder, recurrent, moderate: Secondary | ICD-10-CM | POA: Diagnosis not present

## 2022-03-28 MED ORDER — DIPHENHYDRAMINE HCL 25 MG PO CAPS
50.0000 mg | ORAL_CAPSULE | Freq: Four times a day (QID) | ORAL | Status: DC | PRN
Start: 2022-03-28 — End: 2022-04-07

## 2022-03-28 MED ORDER — LORAZEPAM 0.5 MG PO TABS
0.5000 mg | ORAL_TABLET | Freq: Three times a day (TID) | ORAL | Status: DC | PRN
Start: 2022-03-28 — End: 2022-04-07
  Administered 2022-04-01: 0.5 mg via ORAL
  Filled 2022-03-28: qty 1

## 2022-03-28 MED ORDER — HALOPERIDOL 10 MG PO TABS
10.0000 mg | ORAL_TABLET | Freq: Four times a day (QID) | ORAL | Status: DC | PRN
Start: 2022-03-28 — End: 2022-04-07
  Administered 2022-03-29 – 2022-04-06 (×9): 10 mg via ORAL
  Filled 2022-03-28 (×9): qty 1

## 2022-03-28 MED ORDER — TORSEMIDE 20 MG PO TABS
80.0000 mg | ORAL_TABLET | Freq: Two times a day (BID) | ORAL | Status: DC
Start: 2022-03-28 — End: 2022-04-07
  Administered 2022-03-28 – 2022-04-07 (×21): 80 mg via ORAL
  Filled 2022-03-28 (×21): qty 4
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
## 2022-03-28 NOTE — Group Note (Signed)
OT Psych Group Documentation    Topics: Feel Good      Occupations Addressed: Activities of daily living (functional mobility), Instrumental activities of daily living (health management & maintenance), Leisure, Social participation      Skills addressed: Fine/gross motor skills, social interaction skills, process skills, emotional regulation, mental functions    Date: 03/28/2022  Start Time: 1015  End Time: 1100    Attendance: Declined    Beatryce Colombo, OT  lic# [id number]

## 2022-03-28 NOTE — Plan of Care (Signed)
Irritable on approach, easily frustrated, yelling out when her needs aren't met immediately . No unsafe behavior was exhibited . VSS O2 sat WNL continues on 02 via N/C 2/l . Pt took med's as ordered except miralax. She completed valium taper. No S/S of detox. Requested nicorette gum for cigarette craving given with effects . Pt ate 100% breakfast and lunch. Pt denies SI AH VH SI . Pt declined group The patient was offered the following alternatives to group attendance: individual contact with staff and journaling. Using the tablet to watch movies . Coping skills were encouraged. Will continue to assess pt and monitor for safety    Last BM 7/10

## 2022-03-28 NOTE — Group Note (Signed)
OT Psych Group Documentation    Topics: Breakfast Bites      Occupations Addressed: Activities of Daily living, Social participation, Instrumental activities of daily living (health management & maintenance)     Skills addressed: Fine/gross motor skills, process skills, social interaction skills, behavioral control, mental functions (cognitive, perceptual, affective)     Date: 03/28/2022  Start Time: 0800  End Time: 0845    Attendance: Attended entire group  Participation/Patient Response: Interactive with others, Intrusive/disruptive behavior and Unresponsive to cues/limits/redirection from staff  Participation within a task: Able to complete task independently, Demonstrated ability to make decisions and Difficulty with frustration tolerance  Treatment Goals Addressed: Development/maintenance of a health lifestyle, Increased attention/concentration/focus, Increased communication skills, Increased independence in functioning, Increased socializaton/social skills and Verbalization of needs    Comments: Loud and demanding, difficulty with re-direction and frustration tolerance. Attentive to meal, completed menu independently.    Darcel Bayley, OT  lic# [id number]

## 2022-03-28 NOTE — Event Note (Signed)
Still overloaded on exam  Increase torsemide to 80 bid  Recheck bmp in 3 days    Rayburn Go, MD  [phone]

## 2022-03-28 NOTE — Plan of Care (Addendum)
Patient received sleeping in bed with no apparent distress. Listened music on Ipad. Alert and easily irritable on approach. She is on safety checks. Requested lotion for dry skin bilateral lower limb, applied. At 3:40am requested PRN Trazodone for sleep, received with good effects. Ate snacks saltine cracker and gingerale. O2 sat 90% at 2 litre via nasal canula, P: 83/m, BP: 107/70. Independent BR used. Came out to dining room this morning, received PRN Nicorette gum as per request. Compliant with med. Denied pain. No SOB or respiratory distress noted. No safety concerns.  Mylena slept 6.4hrs. Will continue monitor and maintain safety checks.

## 2022-03-28 NOTE — Progress Notes (Signed)
PSYCH PROGRESS NOTE    Identifying Data:  Cheryl Zimmerman a 50 year oldwoman,previously residing in [name] group home but now homeless and living on the street,diagnostic psychiatric history ofPTSD, schizoaffective disorder bipolar type, polysubstance use (alcohol and cocaine currently and opiates,multipleprior psychiatric hospitalizations starting in childhood(most recent [name]9 December 2021),multiple prior suicide attempts and SIB requiring medical attention but not recenytly, medical history significant forHFpEF with exacerbation in May 2023, severe head trama and seizure d/oon Keppra,loss of son to murder 1.5 years ago, presenting with SIwith plan to shoot selfand HI towards "everyone" in setting of overwhelm around grieving her son, ongoing homelessness, and alcohol withdrawal.     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  From the nursing report: During the weekend the patient  was on alcohol withdrawal protocol, tolerated it well but complained of anxiety, participates in a few groups but has difficulty sitting through the whole group.  Tends to self, mostly watching TV in bed or in the common area.  Very depressed, frequently tearful.  Requested.  For complaints of seeing her son being shot and hearing gunshots  requested Haldol and took it with good results.  Ambulates with a walker, pulling oxygen behind her, she is on 2 L and her vital signs were within normal limits.  Compliant with medications, tolerated them well.  The nurse encouraged coping skills.  Last BM was on July 8.  Slept through the night.  From the internist, Ed Blalock, still overloaded on exam, increase torsemide to 80 mg twice daily.  Patient was seen during morning rounds, presented in bed watching TV, easily engaged in conversation.  Was upset that Valium taper is over, complained of anxiety.  The patient was prescribed lorazepam as an outpatient by milligram 3 times daily it was confirmed with PMDP review.  The patient is currently taking 3 pills of Suboxone instead of 4 that were prescribed to her outside and has not even noticed the change.  Her pain is controlled.  Complaints of depression, no suicidal statements, stated that she constantly sees her son being killed but would not talk about that, stated that it makes her very uncomfortable.  Encouraged to participate in  groups more and being more active in the unit but prefers to be alone.  Not really interested in groups.  Stated that after discharge she was clean for a while and relapsed on Father's Day.  Was hospitalized in [facility] for medical reasons.  Expressed a desire to be in adult foster care, the writer and emaled foster care agency is expecting a response.  Allergies:  Review of Patient's Allergies indicates:   Bee venom               Anaphylaxis   Methylprednisolone      Dizziness, Drowsiness    Comment:Also believes syncope. Seems to  tolerate             inhalational and epidural steroid.   Nutritional supplem*       Hydroxyzine             Nausea Only    Scheduled Meds:  Current Facility-Administered Medications   Medication Dose Route Frequency Last Admin   . torsemide (DEMADEX) tablet 80 mg  80 mg Oral BID     . OXcarbazepine (TRILEPTAL) tablet 300 mg  300 mg Oral TID 300 mg at 03/28/22 1322   . buprenorphine-naloxone (SUBOXONE) 8-2 MG SL tablet 1 tablet  8 mg Sublingual TID 1 tablet at 03/28/22 1330   . methocarbamol (ROBAXIN) tablet 500 mg  500 mg Oral TID 500 mg at 03/28/22 1322   . spironolactone (ALDACTONE) tablet 50 mg  50 mg Oral Daily 50 mg at 03/28/22 1610   . polyethylene glycol (GLYCOLAX/MIRALAX) packet 17 g  17 g Oral Daily 17 g at 03/27/22 0802   . sennosides (SENOKOT) tablet 17.2 mg  17.2 mg Oral Nightly 17.2 mg at 03/27/22 1940   . levETIRAcetam (KEPPRA) tablet 750 mg  750 mg Oral BID 750 mg at 03/28/22 9604   . baclofen (LIORESAL) tablet 10 mg  10 mg Oral BID 10 mg at 03/28/22 0823   . prazosin (MINIPRESS) capsule 1 mg  1 mg Oral Nightly 1 mg at 03/27/22 1940   . gabapentin (NEURONTIN) capsule 600 mg  600 mg Oral TID 600 mg at 03/28/22 1322   . levothyroxine (SYNTHROID) tablet 25 mcg  25 mcg Oral DAILY 25 mcg at 03/28/22 0643   . nicotine (NICODERM CQ) 21 MG/24HR 1 patch  1 patch Transdermal Daily 1 patch at 03/28/22 0823   . pantoprazole (PROTONIX) EC tablet 40 mg  40 mg Oral Daily 40 mg at 03/28/22 5409         PRN Meds:  Current Facility-Administered Medications   Medication Dose Route Frequency Last Admin   . diphenhydrAMINE  50 mg Oral Q6H PRN      And   . haloperidol  10 mg Oral Q6H PRN     . LORazepam  0.5 mg Oral TID PRN     . diazepam  5 mg Oral Q8H PRN     . nicotine polacrilex  4 mg Oral Q2H PRN 4 mg at 03/28/22 1329   . acetaminophen  650 mg Oral Q4H PRN 650 mg at 03/26/22 0701   . magnesium hydroxide  30 mL Oral Daily PRN     . aluminum-magnesium hydroxide-simethicone  30 mL Oral Q2H PRN     . OLANZapine zydis  5  mg Oral Q6H PRN     . traZODone  50 mg Oral Nightly PRN 50 mg at 03/27/22 0340   . albuterol HFA  2 puff Inhalation Q6H PRN     .  diazepam  10 mg Oral Q2H PRN 10 mg at 03/25/22 2012         VITALS:  Patient Vitals for the past 24 hrs:   BP Temp Pulse Resp SpO2   03/28/22 1322 129/84 -- 77 18 93 %   03/28/22 0835 108/75 97 F (36.1 C) 79 18 --   03/28/22 0822 -- -- -- 18 94 %   03/28/22 0600 107/70 -- 75 -- 90 %   03/27/22 1941 -- -- -- 12 --   03/27/22 1940 110/78 -- -- -- --   03/27/22 1559 107/68 97.1 F (36.2 C) 76 -- 96 %       MENTAL STATUS EXAM:   Mental Status Exam   General Appearance: Clean;Dressed appropriately  Behavior: Cooperative  Level of Consciousness: Alert  Orientation Level: Oriented x3  Attention/Concentration: WNL  Mannerisms/Movements: No abnormal mannerisms/movements  Speech Quality and Rate: WNL  Speech Clarity: Clear  Speech Tone: Normal vocal inflection  Vocabulary/Fund of Knowledge: WNL  Memory: Intact  Thought Process & Associations: Goal-directed  Dissociative Symptoms: None  Thought Content: No abnormalities reported or observed  Hallucinations: None  Suicidal Thoughts: Passive thoughts  Homicidal Thoughts: None  Mood: Depressed/Sad;Irritable  * Mood comment: "I feel like shit"  Affect: Congruent with mood  Judgment: Fair  Insight: Fair    C-SSRS:  Default Flowsheet Data (most recent)     C-SSRS Frequent Screener - 03/28/22 1025        C-SSRS Frequent Screener    Since last contact, have you actually had thoughts about killing yourself ?  No     Have you been thinking about how you might do this?  No     Have you had these thoughts and had some intention of acting on them?  No     Have you started to work out or worked out the details of how to kill yourself? Do you intend to carry out this plan? No     Have you done anything, started to do anything, or prepared to do anything to end your life?  No     C-SSRS Frequent Screener Risk Score No Risk                 Default Flowsheet Data  (most recent)     C-SSRS Risk Assessment - 03/25/22 1245        C-SSRS Risk Assessment    Suicidal and Self-Injurious Behavior (Past 3 Months) --   denies    Suicidal and Self-Injurious Behavior (Lifetime) --   denies    Suicidal Ideation Check Most Severe in Past Month --   denies    Activating Events (Recent) Current or pending isolation or feeling alone     Treatment History Previous psychiatric diagnoses and treatments;Hopeless or dissatisfied with treatment;Non-compliant with treatment     Protective Factors (Recent) Identifies reasons for living;Fear of death or dying due to pain and suffering     Any suicidal, self-injurious, or aggressive behaviors?  Yes     Describe any suicidal, self-injurious or aggressive behavior (INCLUDE DATES) had a serious suicidal attempt after her son was mordered 15 years ago, some cutting behavior after that                 Physical/Somatic Complaints  The patient lists: headaches          DSM-V    Primary Diagnosis: Major depressive disorder, recurrent episode, moderate   ([name])   Secondary Diagnosis: Borderline personality  disorder [facility])         Informed Consent for Antipsychotics - Sun March 27, 2022     Row Name 1448       Informed Consent for Antipsychotics    Haloperidol (Haldol) Patient/HCP agrees with treatment plan  -GV at 03/27/22 1931          User Key  (r) = Recorded By, (t) = Taken By, (c) = Cosigned By    Initials Name Provider Type Discipline    Brunetta Genera, MD Physician Psychiatrist                Assessment  [facility] 50 [facility],previously residing in [name] but now homeless and living on the street,diagnostic psychiatric history ofPTSD, schizoaffective disorder bipolar type, polysubstance use (alcohol and cocaine currently and opiates,multipleprior psychiatric hospitalizations starting in childhood(most recent [name]9 December 2021),multiple prior suicide attempts and SIB requiring medical attention but not recenytly,  medical history significant forHFpEF with exacerbation in May 2023, severe head trama and seizure d/oon Keppra,loss of son to murder 1.5 years ago, presenting with SIwith plan to shoot selfand HI towards "everyone" in setting of overwhelm around grieving her son, ongoing homelessness, and alcohol withdrawal.    Plan  Regular diet  Continue preadmission medications  Continue Suboxone dose to 24 mg a day, other medications remain the same.  suboxone 8-2mg tid  - methocarbamol 500mg  tid  - spironolactone 50mg  qam  -Increase torsemide 80mg  bid  - miralax 17g packet qam  - senna 17.2mg  qam  - keppra 750mg  bid  - baclofen 10mg  bid  - prazosin 1mg  qhs  The taper is over, restart lorazepam half milligram 3 times daily as needed.  Start Haldol 2 mg, Benadryl 50 mg every 6 hours as needed for agitation.    Encourage participation in groups, sensory integration  Social work evaluation aftercare planning      Review with patient: Treatment plan reviewed with the patient.

## 2022-03-28 NOTE — Group Note (Signed)
OT Psych Group Documentation    Topics: Project      Occupations Addressed: Leisure, Social participation     Skills addressed: Fine/gross motor skills, process skills, social interaction skills, behavioral control, mental functions                  Date: 03/28/2022  Start Time: 1100  End Time: 1145    Attendance: Declined      Rylen Swindler, OT/s  lic# No licensure found in state: [place]

## 2022-03-28 NOTE — Progress Notes (Signed)
Cheryl Zimmerman has been visible and active in the group room. She ate dinner, watched TV, and listened to music there along with select peers. Her appetite was 100% and she accepted medications as prescribed, including the nicorette gum for nicotine craving x 1. Retreated in her room, Cheryl Zimmerman used a tablet for solitary activities. She reported no pain, no BM, and denied SI/HI/AH/VH. No safety concerns or behavioral problems at the present time. Will continue to monitor and assist as needed.

## 2022-03-29 DIAGNOSIS — R45851 Suicidal ideations: Secondary | ICD-10-CM | POA: Diagnosis not present

## 2022-03-29 DIAGNOSIS — F331 Major depressive disorder, recurrent, moderate: Secondary | ICD-10-CM | POA: Diagnosis not present

## 2022-03-29 DIAGNOSIS — F1120 Opioid dependence, uncomplicated: Secondary | ICD-10-CM | POA: Diagnosis not present

## 2022-03-29 DIAGNOSIS — I5032 Chronic diastolic (congestive) heart failure: Secondary | ICD-10-CM | POA: Diagnosis not present

## 2022-03-29 MED ORDER — POTASSIUM CHLORIDE CRYS ER 10 MEQ PO TBCR
10.0000 meq | EXTENDED_RELEASE_TABLET | Freq: Every day | ORAL | Status: DC
Start: 2022-03-29 — End: 2022-03-29

## 2022-03-29 MED ORDER — NYSTATIN 100000 UNIT/GM EX POWD
Freq: Two times a day (BID) | CUTANEOUS | Status: DC
Start: 2022-03-29 — End: 2022-03-30
  Filled 2022-03-29: qty 30

## 2022-03-29 MED ORDER — NYSTATIN 100000 UNIT/GM EX POWD
Freq: Three times a day (TID) | CUTANEOUS | Status: DC
Start: 2022-03-29 — End: 2022-04-07
  Filled 2022-03-29 (×2): qty 30

## 2022-03-29 MED ORDER — POTASSIUM CHLORIDE 20 MEQ PO PACK
10.00 meq | PACK | Freq: Every day | ORAL | Status: AC
Start: 2022-03-30 — End: 2022-04-02
  Administered 2022-03-30 – 2022-04-02 (×4): 10 meq via ORAL
  Filled 2022-03-29 (×4): qty 1

## 2022-03-29 NOTE — Plan of Care (Signed)
Pt has been visible in the common areas using a Rolator walker with a steady gait occasionally irritable .No unsafe behavior was exhibited . VSS O2 sat WNL. Pt took med's as ordered except miralax she refused . Nicorette gum for cigarette craving with effects. Pt ate 100% breakfast and lunch. Pt denies SI AH VH SI . Rash on abdominal folds . MD was notified  a new order for nystatin powder applied by pt per her request . Pt had a shower . New order for the lab in the morning . She will start on kcl meq daily .Pt declined group. The patient was offered the following alternatives to group attendance: individual contact with staff and journaling. Using the tablet to watch movies . Coping skills were encouraged. Will continue to assess pt and monitor for safety. Last BM 7/9

## 2022-03-29 NOTE — Group Note (Signed)
OT Psych Group Documentation    Topics: Breakfast Bites      Occupations Addressed: Activities of Daily living, Social participation, Instrumental activities of daily living (health management & maintenance)     Skills addressed: Fine/gross motor skills, process skills, social interaction skills, behavioral control, mental functions (cognitive, perceptual, affective)                Date: 03/29/2022  Start Time: 0800  End Time: 0845    Attendance: Declined    Comments: Cheryl Zimmerman presented with irritable edge, initially observed in DR but left at the start of group, declining group invitation. Then, observed around DR, requesting to take meal and coffee to her room with demanding tone. Asked for assistance with menu task but able to make decisions.     Horton Marshall, OT/s  lic# No licensure found in state: [place]

## 2022-03-29 NOTE — Plan of Care (Signed)
Problem: Coping Ineffective, Individual  Description: 51 year old bipolar female with chief complaint "I am having a hard time", presents tearful, but calm and cooperative, seeking help for feelings of SI/HI and withdrawal symptoms in the context of alcohol intoxication  Goal: Demonstrates effective coping during hospitalization  Description: The patient will continue to seek assistance for the management of her psychotic impulses throughout her hospital stay, and they will be at manageable levels prior to discharge   Outcome: Progressing   Pt was received in the group room playing bingo with the group earlier in the shift. VSS. requested for prn nicorette gum for craving with good effect. Ate 100% of dinner with the group in the dinning room. Pt watched tv for a short while in the group room before going back to her room. Pt complained of headache PR 7/10, prn tylenol was administered with good effect. Pt reported not moving her bowel for the past 6 days, prn MOM was administered result pending. Pt was calm irritable at times especially if her needs are not met right away. No s/sx respiratory distress noted, no SOB noted O2 sat 95% on 2L . Pt denies SI/HI, denies covid s/sx. Pt took all meds as ordered with prn haldol to help her sleep. Pt continues on 5 minute safety checks, will continue to follow plan of care.

## 2022-03-29 NOTE — Plan of Care (Signed)
Problem: Coping Ineffective, Individual  Description: 51 year old bipolar female with chief complaint "I am having a hard time", presents tearful, but calm and cooperative, seeking help for feelings of SI/HI and withdrawal symptoms in the context of alcohol intoxication  Goal: Demonstrates/reports adjustment to illness/condition during hospitalization  Description: Patient will use appropriate coping skills to repress her dysregulated impulses and report improved wellbeing through interactions with peers and staff on the unit   Outcome: Progressing  Received resting peacefully in bed. No COVID sx. No indications of pain/SI/HI/AH/VH. 5 minute safety checks. No behavioral or safety concerns.     *Sat:  96 % 2LNC  *LBM: 7/9  *Sleep hours:  7.0  *Code status: Full  *Legal status: Section 12

## 2022-03-29 NOTE — Group Note (Signed)
OT Psych Group Documentation    Topics: Project      Occupations Addressed: Leisure, Social participation     Skills addressed: Fine/gross motor skills, process skills, social interaction skills, behavioral control, mental functions    Date: 03/29/2022  Start Time: 1100  End Time: 1145    Attendance: Declined    Emori Mumme, OT  lic# [id number]

## 2022-03-29 NOTE — Progress Notes (Signed)
PSYCH PROGRESS NOTE    Identifying Data:  Cheryl Zimmerman a 89 year oldwoman,previously residing in [facility] but now homeless and living on the street,diagnostic psychiatric history ofPTSD, schizoaffective disorder bipolar type, polysubstance use (alcohol and cocaine currently and opiates,multipleprior psychiatric hospitalizations starting in childhood(most recent [facility] December 2021),multiple prior suicide attempts and SIB requiring medical attention but not recenytly, medical history significant forHFpEF with exacerbation in May 2023, severe head trama and seizure d/oon Keppra,loss of son to murder 1.5 years ago, presenting with SIwith plan to shoot selfand HI towards "everyone" in setting of overwhelm around grieving her son, ongoing homelessness, and alcohol withdrawal.     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  From the nursing report: Cheryl Zimmerman has been visible and  active in the group room. She ate dinner, watched TV, and listened to music there along with select peers. Her appetite was 100% and she accepted medications as prescribed, including the nicorette gum for nicotine craving x 1. Retreated in her room, Cheryl Zimmerman a tablet for solitary activities. She reported no pain, no BM, and denied SI/HI/AH/VH. No safety concerns or behavioral problems at the present time.  Slept through the night.  In the morning.  Irritable, frustrated with her needs are not met immediately.  Compliant with medications, good appetite, concerned 100% of meals.  No detox symptoms.  Visible, participated in some groups, listened to music and watch TV with peers.  Denied suicidal and homicidal thoughts and auditory and visual hallucinations.  Patient is staff splitting: Can be very rude and demanding.  Tends to walk the unit without oxygen needs to be reminded.  Was seen by hospitalist, the dose of torsemide was increased from 40 mg twice daily to 80 mg twice daily for 1 week and the patient requested potassium chloride as her potassium was very low last time when the dose of torsemide was increased.    Patient was seen during morning rounds together with the social worker, presented in bed watching TV, easily engaged in conversation.  Was upset with the staff and complaining to the writer and the Education officer, museum.  In the morning she approached the social worker saying that  she would like to be discharged to a shelter but during our visits she changed her mind and stated that she was very upset in the morning and that was her reaction.  .  The patient denies suicidal and homicidal thoughts.  She complains of anxiety.  But she is future-oriented and would like help with housing.  She agreed to go to rest home, respite or foster care, whatever will be available sooner.  The patient wants those options today and is very impatient and easily becomes  Event organiser.  Needs constant hand holding, redirection,  support.  allergies:  Review of Patient's Allergies indicates:   Bee venom               Anaphylaxis   Methylprednisolone      Dizziness, Drowsiness    Comment:Also believes syncope. Seems to tolerate             inhalational and epidural steroid.   Nutritional supplem*       Hydroxyzine             Nausea Only    Scheduled Meds:  Current Facility-Administered Medications   Medication Dose Route Frequency Last Admin   . [START ON 03/30/2022] potassium chloride (KLOR-CON) packet 10 mEq  10 mEq Oral Daily     . nystatin (MYCOSTATIN) powder   Topical TID     . nystatin (MYCOSTATIN) powder   Topical BID Given at 03/29/22 1322   . torsemide (DEMADEX) tablet 80 mg  80 mg Oral BID 80 mg at 03/29/22 0853   . OXcarbazepine (TRILEPTAL) tablet 300 mg  300 mg Oral TID 300 mg at 03/29/22 1322   . buprenorphine-naloxone (SUBOXONE) 8-2 MG SL tablet 1 tablet  8 mg Sublingual TID 1 tablet at 03/29/22 1331   . methocarbamol (ROBAXIN) tablet 500 mg  500 mg Oral TID 500 mg at 03/29/22 1322   . spironolactone (ALDACTONE) tablet 50 mg  50 mg Oral Daily 50 mg at 03/29/22 0853   . polyethylene glycol (GLYCOLAX/MIRALAX) packet 17 g  17 g Oral Daily 17 g at 03/29/22 0852   . sennosides (SENOKOT) tablet 17.2 mg  17.2 mg Oral Nightly 17.2 mg at 03/28/22 1941   . levETIRAcetam (KEPPRA) tablet 750 mg  750 mg Oral BID 750 mg at 03/29/22 0849   . baclofen (LIORESAL) tablet 10 mg  10 mg Oral BID 10 mg at 03/29/22 0856   . prazosin (MINIPRESS) capsule 1 mg  1 mg Oral Nightly 1 mg at 03/28/22 1941   . gabapentin (NEURONTIN) capsule 600 mg  600 mg Oral TID 600 mg at 03/29/22 1322   . levothyroxine (SYNTHROID) tablet 25 mcg  25 mcg Oral DAILY 25 mcg at 03/29/22 0526   . nicotine (NICODERM CQ) 21 MG/24HR 1 patch  1 patch Transdermal Daily 1 patch at 03/29/22 0852   . pantoprazole (PROTONIX) EC tablet 40 mg  40 mg Oral Daily 40 mg at 03/29/22 0854         PRN Meds:  Current Facility-Administered Medications   Medication Dose Route Frequency Last Admin   .  diphenhydrAMINE  50 mg Oral Q6H PRN      And   . haloperidol  10 mg Oral Q6H PRN     . LORazepam  0.5 mg Oral TID PRN     . diazepam  5 mg Oral Q8H PRN     . nicotine polacrilex  4 mg Oral Q2H PRN 4 mg at 03/29/22 1323   .  acetaminophen  650 mg Oral Q4H PRN 650 mg at 03/26/22 0701   . magnesium hydroxide  30 mL Oral Daily PRN     . aluminum-magnesium hydroxide-simethicone  30 mL Oral Q2H PRN     . OLANZapine zydis  5 mg Oral Q6H PRN     . traZODone  50 mg Oral Nightly PRN 50 mg at 03/27/22 0340   . albuterol HFA  2 puff Inhalation Q6H PRN     . diazepam  10 mg Oral Q2H PRN 10 mg at 03/25/22 2012         VITALS:  Patient Vitals for the past 24 hrs:   BP Temp Temp src Pulse Resp SpO2   03/29/22 1331 -- -- -- -- 18 --   03/29/22 1325 129/86 97 F (36.1 C) TEMPORAL 86 18 93 %   03/29/22 0853 137/85 -- -- 78 18 94 %   03/29/22 0717 (!) 158/107 98.1 F (36.7 C) -- 84 -- 99 %   03/29/22 0343 -- -- -- -- -- 96 %   03/28/22 1941 112/66 -- -- -- -- --   03/28/22 1940 -- -- -- -- 18 --   03/28/22 1712 109/63 -- -- -- -- --   03/28/22 1555 97/65 95.7 F (35.4 C) -- 69 -- 100 %       MENTAL STATUS EXAM:   Mental Status Exam   General Appearance: Clean;Dressed appropriately  Behavior: Cooperative  Level of Consciousness: Alert  Orientation Level: Oriented x3  Attention/Concentration: WNL  Mannerisms/Movements: No abnormal mannerisms/movements  Speech Quality and Rate: WNL  Speech Clarity: Clear  Speech Tone: Normal vocal inflection  Vocabulary/Fund of Knowledge: WNL  Memory: Intact  Thought Process & Associations: Goal-directed  Dissociative Symptoms: None  Thought Content: No abnormalities reported or observed  Hallucinations: None  Suicidal Thoughts: Passive thoughts  Homicidal Thoughts: None  Mood: Depressed/Sad;Irritable  * Mood comment: "I feel like shit"  Affect: Congruent with mood  Judgment: Fair  Insight: Fair    C-SSRS:  Default Flowsheet Data (most recent)     C-SSRS Frequent Screener - 03/29/22 1054        C-SSRS  Frequent Screener    Since last contact, have you actually had thoughts about killing yourself ?  No     Have you been thinking about how you might do this?  No     Have you had these thoughts and had some intention of acting on them?  No     Have you started to work out or worked out the details of how to kill yourself? Do you intend to carry out this plan? No     Have you done anything, started to do anything, or prepared to do anything to end your life?  No     C-SSRS Frequent Screener Risk Score No Risk                 Default Flowsheet Data (most recent)     C-SSRS Risk Assessment - 03/25/22 1245        C-SSRS Risk Assessment    Suicidal and Self-Injurious Behavior (Past 3 Months) --   denies    Suicidal and Self-Injurious Behavior (Lifetime) --   denies    Suicidal Ideation Check Most Severe in Past Month --   denies    Activating Events (Recent) Current or pending isolation or feeling alone     Treatment History Previous psychiatric diagnoses and treatments;Hopeless or dissatisfied with treatment;Non-compliant with treatment  Protective Factors (Recent) Identifies reasons for living;Fear of death or dying due to pain and suffering     Any suicidal, self-injurious, or aggressive behaviors?  Yes     Describe any suicidal, self-injurious or aggressive behavior (INCLUDE DATES) had a serious suicidal attempt after her son was mordered 15 years ago, some cutting behavior after that                 Physical/Somatic Complaints  The patient lists: headaches          DSM-V    Primary Diagnosis: Major depressive disorder, recurrent episode, moderate   ([employer])   Secondary Diagnosis: Borderline personality disorder [facility])         Informed Consent for Antipsychotics - Sun March 27, 2022     [place] Name [id number]       Informed Consent for Antipsychotics    Haloperidol (Haldol) Patient/HCP agrees with treatment plan  -GV at 03/27/22 1931          User Key  (r) = Recorded By, (t) = Taken By, (c) = Cosigned By    [place] Name  Provider Type Discipline    Anabel Halon, MD Physician Psychiatrist                Assessment  Cheryl Zimmerman a 41 year oldwoman,previously residing in [facility] group home but now homeless and living on the street,diagnostic psychiatric history ofPTSD, schizoaffective disorder bipolar type, polysubstance use (alcohol and cocaine currently and opiates,multipleprior psychiatric hospitalizations starting in childhood(most recent [facility] 2021),multiple prior suicide attempts and SIB requiring medical attention but not recenytly, medical history significant forHFpEF with exacerbation in May 2023, severe head trama and seizure d/oon Keppra,loss of son to murder 1.5 years ago, presenting with SIwith plan to shoot selfand HI towards "everyone" in setting of overwhelm around grieving her son, ongoing homelessness, and alcohol withdrawal.    Plan  Regular diet  Continue preadmission medications  Continue Suboxone dose to 24 mg a day, other medications remain the same.  suboxone 8-24mtid  - methocarbamol 5036mtid  - spironolactone 5065mam  -Increase torsemide 10m6md  - miralax 17g packet qam  - senna 17.2mg 56m  - keppra 750mg 59m - baclofen 10mg b58m- prazosin 1mg qhs33mhe taper is over, restart lorazepam half milligram 3 times daily as needed.  Start Haldol 2 mg, Benadryl 50 mg every 6 hours as needed for agitation.    Encourage participation in groups, sensory integration  Social work evaluation aftercare planning      Review with patient: Treatment plan reviewed with the patient.

## 2022-03-29 NOTE — Progress Notes (Addendum)
Social Work Progress Note      Clinical Update: Pt was reviewed in clinical team meeting. Pt seen in her room by this Clinical research associate and Dr. Ronie Spies. Pt was visible resting in bed. Pt expressed frustrations about staff treatment. This morning, as a result of this frustration, pt reported she wanted to be discharged today to a shelter. After being provided with reassurances, and a plan made for how to manage rash under her stomach, pt agreed to stay on the unit for treatment and placement. Pt is interested in pursuing adult foster care placement or a rest home. It is unlikely she will be eligible for a rest home. Pt was frustrated to hear that this may take some time, would not like to be on the unit for very long.       Barriers To Discharge:  -on oxygen  -homeless       Collateral:   -meeting with [facility] team scheduled for tomorrow at 11:00 AM   -call placed to [facility] rest home, they are unable to accept her at this time due to her oxygen needs  -call placed to [facility] for consult about rest homes, given pt's oxygen requirement, he stated it will not be possible to place her in a rest home   -spoke with intake coordinator at [facility] [place], [place]: [phone], who will call within three days to schedule an assessment, referral packet faxed to them F: [phone]      Key Collateral:   -Dione Plover caseworker from [place] [phone], Email: [email or web address]    Social Work Plan:     -Meet with patient daily to assess mood and monitor progress.  -Speak with collaterals to gain collateral information.  -Encourage patient to engage in the groups while in the unit.  -Evaluate living situation and support system.  -Coordinate treatment and discharge planning with the outpatient team.          Karn Pickler, SWI

## 2022-03-29 NOTE — Group Note (Signed)
OT Psych Group Documentation    Topics: Feel Good      Occupations Addressed: Activities of daily living (functional mobility), Instrumental activities of daily living (health management & maintenance), Leisure, Social participation      Skills addressed: Fine/gross motor skills, social interaction skills, process skills, emotional regulation, mental functions     Date: 03/29/2022  Start Time: 1015  End Time: 1100    Attendance: Cheryl Zimmerman, agitated    Darcel Bayley, OT  lic# [id number]

## 2022-03-29 NOTE — Progress Notes (Signed)
PROGRESS NOTE    CC: worsening pain in feet from edema    Subjective: also reports severe groin rash, painful    Interval History: increasing torsemide from home 40 bid to 80 bid, pt agrees she feels more fluid overload now than previously  Asked to be on kcl while on the increased torsemide since last time had a critically low k when on higher torsemide  Agreed to daily k check for next few days to monitor  Likely on increased torsemide for 1 week then back to 40 bid     Allergies:   Review of Patient's Allergies indicates:   Bee venom               Anaphylaxis   Methylprednisolone      Dizziness, Drowsiness    Comment:Also believes syncope. Seems to tolerate             inhalational and epidural steroid.   Nutritional supplem*       Hydroxyzine             Nausea Only     Pre-Admission Medications:  torsemide 40 MG TABS, Take 40 mg by mouth in the morning., Disp: 30 tablet, Rfl: 0  spironolactone (ALDACTONE) 50 MG tablet, Take 1 tablet by mouth in the morning., Disp: 30 tablet, Rfl: 0  QUEtiapine (SEROQUEL) 50 MG tablet, Take 2 tablets by mouth at bedtime, Disp: 60 tablet, Rfl: 0  albuterol HFA 108 (90 Base) MCG/ACT inhaler, Inhale 2 puffs into the lungs every 6 (six) hours as needed for Wheezing, Disp: 1 each, Rfl: 0  atorvastatin (LIPITOR) 80 MG tablet, Take 1 tablet by mouth in the morning., Disp: 30 tablet, Rfl: 0  FLUoxetine (PROZAC) 20 MG capsule, Take 1 capsule by mouth in the morning., Disp: 30 capsule, Rfl: 0  gabapentin (NEURONTIN) 300 MG capsule, Take 2 capsules by mouth in the morning and 2 capsules at noon and 2 capsules before bedtime., Disp: 180 capsule, Rfl: 0  levETIRAcetam (KEPPRA) 750 MG tablet, Take 1 tablet by mouth in the morning and 1 tablet before bedtime., Disp: 60 tablet, Rfl: 0  loxapine (LOXITANE) 25 MG capsule, Take 1 capsule by mouth in the morning and 1 capsule before bedtime., Disp: 60 capsule, Rfl: 0  OXcarbazepine (TRILEPTAL) 150 MG tablet, Take 1 tablet by mouth in the  morning and 1 tablet before bedtime., Disp: 60 tablet, Rfl: 0  prazosin (MINIPRESS) 1 MG capsule, Take 3 capsules by mouth nightly, Disp: 90 capsule, Rfl: 0  budesonide-formoterol (SYMBICORT) 160-4.5 MCG/ACT inhaler, Inhale 2 puffs into the lungs in the morning and 2 puffs before bedtime., Disp: 1 each, Rfl: 0  levothyroxine (SYNTHROID) 25 MCG tablet, Take 1 tablet by mouth every morning before breakfast, Disp: 30 tablet, Rfl: 0  OTHER MEDICATION, Device: Inogen 3  Oxygen Flow Rate: 2-4 LPM  Titration Goal: SpO2 between 90% and 95%  Instructions:  1. Begin with 2 LPM and monitor oxygen saturation using a pulse oximeter.  2. If oxygen saturation is below 90%, increase the flow rate by 0.5 LPM every 15-30 minutes until the desired saturation level of 90%-95% is achieved.  3. If oxygen saturation is above 95%, decrease the flow rate by 0.5 LPM every 15-30 minutes until the desired saturation level of 90%-95% is achieved.  4. Once the desired saturation level is achieved, maintain the flow rate at that level.    DX: COPD with Hypoxia, Requires continuous supplemental oxygen  ICD10: Code: J44.9 and Z99.81  SIGNED Claire Shown, CNP, 01/05/2022    Nurse Practitioner   Hospitalist Division  [facility]  Units [place] [address] and [place] [address]  Phone:[phone] (fastest) or [phone]   Email:[email or web address]     NPI: [id number], Disp: 1 Device, Rfl: 0  OTHER MEDICATION, Wheelchair: Electric  For use as directed.    DX: Gait instability and falls due to lumbar stenosis of central canal     ICD10: R26.81, Disp: 1 Device, Rfl: 0        Scheduled Medications:  . [START ON 03/30/2022] potassium chloride  10 mEq Oral Daily   . nystatin   Topical TID   . torsemide  80 mg Oral BID   . OXcarbazepine  300 mg Oral TID   . buprenorphine-naloxone  8 mg Sublingual TID   . methocarbamol  500 mg Oral TID   . spironolactone  50 mg Oral Daily   . polyethylene glycol  17 g Oral Daily   . sennosides  17.2 mg Oral Nightly   . levETIRAcetam  750  mg Oral BID   . baclofen  10 mg Oral BID   . prazosin  1 mg Oral Nightly   . gabapentin  600 mg Oral TID   . levothyroxine  25 mcg Oral DAILY   . nicotine  1 patch Transdermal Daily   . pantoprazole  40 mg Oral Daily       PRN Medications:  Current Facility-Administered Medications   Medication Dose Route Frequency Last Admin   . diphenhydrAMINE  50 mg Oral Q6H PRN      And   . haloperidol  10 mg Oral Q6H PRN     . LORazepam  0.5 mg Oral TID PRN     . diazepam  5 mg Oral Q8H PRN     . nicotine polacrilex  4 mg Oral Q2H PRN 4 mg at 03/28/22 1714   . acetaminophen  650 mg Oral Q4H PRN 650 mg at 03/26/22 0701   . magnesium hydroxide  30 mL Oral Daily PRN     . aluminum-magnesium hydroxide-simethicone  30 mL Oral Q2H PRN     . OLANZapine zydis  5 mg Oral Q6H PRN     . traZODone  50 mg Oral Nightly PRN 50 mg at 03/27/22 0340   . albuterol HFA  2 puff Inhalation Q6H PRN     . diazepam  10 mg Oral Q2H PRN 10 mg at 03/25/22 2012         Intake/Output last 24hours (7a-7a):   No intake/output data recorded.    Vital Signs - Last 24 Hours:   BP: (97-158)/(63-107)   Temp:  [95.7 F (35.4 C)-98.1 F (36.7 C)]   Pulse:  [69-84]   Resp:  [18]   SpO2:  [93 %-100 %]     Vital Signs - Last 8 Hours:   BP: (137-158)/(85-107)   Temp:  [98.1 F (36.7 C)]   Pulse:  [78-84]   Resp:  [18]   SpO2:  [94 %-99 %]     Physical Exam:   BP 129/86   Pulse 86   Temp 97 F (36.1 C) (Temporal)   Resp 18   Ht 5\' 9"  (1.753 m)   Wt (!) 147.5 kg (325 lb 3 oz)   SpO2 93%   BMI 48.02 kg/m     General Appearance:  Alert, cooperative, no distress, appears stated age, wearing 2 liters o2 and has 3-4 word sentences, using mild accessory  muscles   Head:  Normocephalic, without obvious abnormality, atraumatic   Eyes:  conjunctiva/corneas clear   Ears:  deferred   Nose: Nares normal   Throat: Lips, mucosa, and tongue normal   Neck: Unable to assess jvp   Back:   Symmetric   Lungs:   Bibasilar crackles, light, low lung volumes, otherwise ctab, no w/r    Breasts:  deferred   Heart:  Distant heart sounds   Abdomen:   Large central obesity   Pelvic: Deferred   Extremities: +1 b/l LEE   Pulses: deferred   Skin: No skin changes on legs   Lymph nodes: deferred   Neurologic: Normal speech          DATA:     Recent labs:  Lab Results   Component Value Date    NA 138 03/27/2022    K 3.9 03/27/2022    CL 97 (L) 03/27/2022    CO2 30 03/27/2022    BUN 16 03/27/2022    CREAT 0.8 03/27/2022    GLUCOSER 116 03/27/2022     Lab Results   Component Value Date    WBC 11.9 (H) 03/23/2022    HGB 15.5 03/23/2022    HCT 46.2 (H) 03/23/2022    PLTA 383 03/23/2022       Assessment/Plan:    No problem-specific Assessment & Plan notes found for this encounter.    70F w/ PMHx HFpEF, seizure disorder, hypothyroidism, ?COPD/OSA, GERD, chronic pain, spinal stenosis w/ sciatica, polysubstance use, schizoaffective disorder bipolar type, PTSD, and SI p/w SI/HI, admitted to [facility] on 7/6.    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 Findings more c/w restrictive physiology but pt unable to tolerate lung volume testing due to claustrophobia. She has been given supplemental oxygen in the past but is likely any intermittent oxygen requirement she's had in the past was more related to CHF in setting of medication non-adherence, undiagnosed OSA, and OHVS.  -may require supplemental  oxygen at night for OSA  -consider repeat PFTs as outpatient including lung volume testing   -should have sleep study asap after d/c from [facility]  -no longer on ics/laba. Would c/w saba prn    - recommend extreme caution with many sedating meds as this can increase CO2 retention and precipitate respiratory failure.    I have spent 60 min and >50% of time at bedside evaluation, counseling and coordination of care.       Rayburn Go, MD, 03/29/2022

## 2022-03-30 DIAGNOSIS — R45851 Suicidal ideations: Secondary | ICD-10-CM | POA: Diagnosis not present

## 2022-03-30 DIAGNOSIS — F331 Major depressive disorder, recurrent, moderate: Secondary | ICD-10-CM | POA: Diagnosis not present

## 2022-03-30 DIAGNOSIS — F1120 Opioid dependence, uncomplicated: Secondary | ICD-10-CM | POA: Diagnosis not present

## 2022-03-30 DIAGNOSIS — I5032 Chronic diastolic (congestive) heart failure: Secondary | ICD-10-CM | POA: Diagnosis not present

## 2022-03-30 LAB — BASIC METABOLIC PANEL
ANION GAP: 11 mmol/L (ref 10–22)
BUN (UREA NITROGEN): 16 mg/dL (ref 7–18)
CALCIUM: 9.6 mg/dL (ref 8.5–10.5)
CARBON DIOXIDE: 35 mmol/L — ABNORMAL HIGH (ref 21–32)
CHLORIDE: 99 mmol/L (ref 98–107)
CREATININE: 0.8 mg/dL (ref 0.4–1.2)
ESTIMATED GLOMERULAR FILT RATE: >60 mL/min (ref >60)
Glucose Random: 101 mg/dL (ref 74–160)
POTASSIUM: 4.1 mmol/L (ref 3.5–5.1)
SODIUM: 144 mmol/L (ref 136–145)

## 2022-03-30 LAB — HOLD PURPLE TOP TUBE

## 2022-03-30 LAB — NT-PROBNP: NT-proBNP: 53 pg/mL (ref 0–125)

## 2022-03-30 NOTE — Plan of Care (Signed)
Problem: Coping Ineffective, Individual  Description: 51 year old bipolar female with chief complaint "I am having a hard time", presents tearful, but calm and cooperative, seeking help for feelings of SI/HI and withdrawal symptoms in the context of alcohol intoxication  Goal: Demonstrates effective coping during hospitalization  Description: The patient will continue to seek assistance for the management of her psychotic impulses throughout her hospital stay, and they will be at manageable levels prior to discharge   Outcome: Progressing  Received resting peacefully in bed. No COVID sx. No indications of pain/SI/HI/AH/VH. 5 minute safety checks. Pending results from 7/11 MOM @ 1906. No behavioral or safety concerns.     *Sat:  94 % 2LNC. No respiratory concerns.   *LBM: 7/9  *Sleep hours: 6.6  *Code status: Full  *Legal status: Section 12

## 2022-03-30 NOTE — Plan of Care (Addendum)
Less irritable in better control. Visible in the milieu at intervals BP this morning was 178/130 ? Inaccurate rechecked 126/89 MD notified no new order .O2 sat WNL continues O2 VIA N/C 2/l. Pt took med's as ordered. Nicorette gum for cigarette craving with effects. Pt ate 100% breakfast and lunch. Pt denies SI AH VH SI . Rash on abdominal folds nystatin powder applied . Pt refused lab drawing . MD was notified and reordered the lab in the morning . Pt group declined. The patient was offered the following alternatives to group attendance: individual contact with staff and journaling. Using the tablet to watch movies . Coping skills were encouraged. Will continue to assess pt and monitor for safety.   1440 C/O constipation milk of mag given result pending

## 2022-03-30 NOTE — Progress Notes (Signed)
PSYCH PROGRESS NOTE    Identifying Data:  Cheryl Zimmerman a 25 year oldwoman,previously residing in [facility] group home but now homeless and living on the street,diagnostic psychiatric history ofPTSD, schizoaffective disorder bipolar type, polysubstance use (alcohol and cocaine currently and opiates,multipleprior psychiatric hospitalizations starting in childhood(most recent [facility] December 2021),multiple prior suicide attempts and SIB requiring medical attention but not recenytly, medical history significant forHFpEF with exacerbation in May 2023, severe head trama and seizure d/oon Keppra,loss of son to murder 1.5 years ago, presenting with SIwith plan to shoot selfand HI towards "everyone" in setting of overwhelm around grieving her son, ongoing homelessness, and alcohol withdrawal.     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  From the nursing report: In good behavioral control calm,  compliant with medications except MiraLAX which she refused.  Complained of not having bowel movement for 5 days and received milk of around 1 [place] with results pending.  Had the summer, misdating powder applied to lower abdominal folds, 34 of the labs and follow-up potassium chloride.  Declined groups in the morning, was using that, listening to music and journaling.  Likes to sit in the day room alone or with a roommate.  During the second shift participated in [place] group.  Ate dinner in the dining area, complained of headache 7 out of 10 and received Tylenol with good effect.  Compliant with medications, no side effects.  The patient is mostly calm,  but can become irritable when her needs are not met immediately.  Took all of her medications and as needed of Haldol.  No respiratory distress.  Slept for 6.6 hours.  Patient was seen during morning rounds, presented in the common area, watching TV, easily engaged in conversation.  Spoke about feeling better, more positive, had a brighter affect, presented with good ADLs.  Denied suicidal or homicidal thoughts, denied hallucinations.  Gave permission for the team to talk to her Tedra Coupe providers, discussed her readiness for paying her part for being able to stay in the respite, rest home, or adult foster care.  Wants help for everyday homelessness.  Social Insurance underwriter and the Probation officer had a long meeting with [employer], [employer]  C[place] and Arletha Pili, who reported that they no the patient for many years.  Attempted to place her in medical group home, rest home was, multiple programs, respites, and an evening living independently, she usually leaves at the beginning of the month when she receives her check.  From independent living she was evicted due to ongoing using cocaine and having visitors that were not allowed to be there.  The patient also has difficulty ambulating the stairs which limits the options for her placement.  Altha Harm suggested  that we talk to Debe Coder about the patient placement and that they will help [place] with information about shelters that might be available for the patient and not having stairs.  They also will attempt to find a medical respite for the patient but it sounded unlikely as the 1 in [place] where the patient has been multiple times before has multiple steps and also has a long waiting list.    Allergies:  Review of Patient's Allergies indicates:   Bee venom               Anaphylaxis   Methylprednisolone      Dizziness, Drowsiness    Comment:Also believes syncope. Seems to tolerate             inhalational and epidural steroid.   Nutritional supplem*       Hydroxyzine             Nausea Only    Scheduled Meds:  Current Facility-Administered Medications   Medication Dose Route Frequency Last Admin   . potassium chloride (KLOR-CON) packet 10 mEq  10 mEq Oral Daily 10 mEq at 03/30/22 0820   . nystatin (MYCOSTATIN) powder   Topical TID Given at 03/30/22 1318   . torsemide (DEMADEX) tablet 80 mg  80 mg Oral BID 80 mg at 03/30/22 0820   . OXcarbazepine (TRILEPTAL) tablet 300 mg  300 mg Oral TID 300 mg at 03/30/22 1318   . buprenorphine-naloxone (SUBOXONE) 8-2 MG SL tablet 1 tablet  8 mg Sublingual TID 1 tablet at 03/30/22 1318   . methocarbamol (ROBAXIN) tablet 500 mg  500 mg Oral TID 500 mg at 03/30/22 1318   . spironolactone (ALDACTONE) tablet 50 mg  50 mg Oral Daily 50 mg at 03/30/22 3382   . polyethylene glycol (GLYCOLAX/MIRALAX) packet 17 g  17 g Oral Daily 17 g at 03/30/22 0823   . sennosides (SENOKOT) tablet 17.2 mg  17.2 mg Oral Nightly 17.2 mg at 03/29/22 2003   . levETIRAcetam (KEPPRA) tablet 750 mg  750 mg Oral BID 750 mg at 03/30/22 5053   . baclofen (LIORESAL) tablet 10 mg  10 mg Oral BID 10 mg at 03/30/22 0820   . prazosin (MINIPRESS) capsule 1 mg  1 mg Oral Nightly 1 mg at 03/29/22 2002   . gabapentin (NEURONTIN) capsule 600 mg  600 mg Oral TID 600 mg at 03/30/22 1318   . levothyroxine (SYNTHROID) tablet 25 mcg   25 mcg Oral DAILY 25 mcg at 03/30/22 0505   . nicotine (NICODERM CQ) 21 MG/24HR 1 patch  1 patch Transdermal Daily 1 patch at 03/30/22 0823   . pantoprazole (PROTONIX) EC tablet 40 mg  40 mg Oral Daily 40 mg at 03/30/22 9767         PRN Meds:  Current Facility-Administered Medications   Medication Dose Route Frequency Last Admin   . diphenhydrAMINE  50 mg Oral Q6H PRN      And   .  haloperidol  10 mg Oral Q6H PRN 10 mg at 03/29/22 2040   . LORazepam  0.5 mg Oral TID PRN     . nicotine polacrilex  4 mg Oral Q2H PRN 4 mg at 03/30/22 0912   . acetaminophen  650 mg Oral Q4H PRN 650 mg at 03/29/22 1852   . magnesium hydroxide  30 mL Oral Daily PRN 30 mL at 03/29/22 1906   . aluminum-magnesium hydroxide-simethicone  30 mL Oral Q2H PRN     . traZODone  50 mg Oral Nightly PRN 50 mg at 03/27/22 0340   . albuterol HFA  2 puff Inhalation Q6H PRN           VITALS:  Patient Vitals for the past 24 hrs:   BP Temp Pulse Resp SpO2   03/30/22 1318 -- -- -- 18 --   03/30/22 0820 126/89 -- 80 18 93 %   03/30/22 0717 (!) 178/130 98.8 F (37.1 C) 77 -- 97 %   03/30/22 0611 -- -- -- -- 94 %   03/29/22 2002 103/66 -- 72 20 95 %   03/29/22 1655 105/68 -- 83 -- --       MENTAL STATUS EXAM:   Mental Status Exam   General Appearance: Clean;Dressed appropriately  Behavior: Cooperative  Level of Consciousness: Alert  Orientation Level: Oriented x3  Attention/Concentration: WNL  Mannerisms/Movements: No abnormal mannerisms/movements  Speech Quality and Rate: WNL  Speech Clarity: Clear  Speech Tone: Normal vocal inflection  Vocabulary/Fund of Knowledge: WNL  Memory: Intact  Thought Process & Associations: Goal-directed  Dissociative Symptoms: None  Thought Content: No abnormalities reported or observed  Hallucinations: None  Suicidal Thoughts: Passive thoughts  Homicidal Thoughts: None  Mood: Depressed/Sad;Irritable  * Mood comment: "I feel like shit"  Affect: Congruent with mood  Judgment: Fair  Insight: Fair    C-SSRS:  Default Flowsheet Data  (most recent)     C-SSRS Frequent Screener - 03/30/22 1006        C-SSRS Frequent Screener    Since last contact, have you actually had thoughts about killing yourself ?  No     Have you been thinking about how you might do this?  No     Have you had these thoughts and had some intention of acting on them?  No     Have you started to work out or worked out the details of how to kill yourself? Do you intend to carry out this plan? No     Have you done anything, started to do anything, or prepared to do anything to end your life?  No     C-SSRS Frequent Screener Risk Score No Risk                 Default Flowsheet Data (most recent)     C-SSRS Risk Assessment - 03/25/22 1245        C-SSRS Risk Assessment    Suicidal and Self-Injurious Behavior (Past 3 Months) --   denies    Suicidal and Self-Injurious Behavior (Lifetime) --   denies    Suicidal Ideation Check Most Severe in Past Month --   denies    Activating Events (Recent) Current or pending isolation or feeling alone     Treatment History Previous psychiatric diagnoses and treatments;Hopeless or dissatisfied with treatment;Non-compliant with treatment     Protective Factors (Recent) Identifies reasons for living;Fear of death or dying due to pain and suffering     Any suicidal, self-injurious,  or aggressive behaviors?  Yes     Describe any suicidal, self-injurious or aggressive behavior (INCLUDE DATES) had a serious suicidal attempt after her son was mordered 15 years ago, some cutting behavior after that                 Physical/Somatic Complaints  The patient lists: headaches          DSM-V    Primary Diagnosis: Major depressive disorder, recurrent episode, moderate   ([place])   Secondary Diagnosis: Borderline personality disorder [facility])         Informed Consent for Antipsychotics - Sun March 27, 2022     [place] Name [id number]       Informed Consent for Antipsychotics    Haloperidol (Haldol) Patient/HCP agrees with treatment plan  -GV at 03/27/22 1931          User Key  (r)  = Recorded By, (t) = Taken By, (c) = Cosigned By    [place] Name Provider Type Discipline    Anabel Halon, MD Physician Psychiatrist                Assessment  [facility] PAQUINis a 26 year oldwoman,previously residing in [facility] group home but now homeless and living on the street,diagnostic psychiatric history ofPTSD, schizoaffective disorder bipolar type, polysubstance use (alcohol and cocaine currently and opiates,multipleprior psychiatric hospitalizations starting in childhood(most recent [facility] December 2021),multiple prior suicide attempts and SIB requiring medical attention but not recenytly, medical history significant forHFpEF with exacerbation in May 2023, severe head trama and seizure d/oon Keppra,loss of son to murder 1.5 years ago, presenting with SIwith plan to shoot selfand HI towards "everyone" in setting of overwhelm around grieving her son, ongoing homelessness, and alcohol withdrawal.    Plan  Regular diet  Continue preadmission medications  Continue Suboxone dose to 24 mg a day, other medications remain the same.  suboxone 8-59mtid  - methocarbamol 501mtid  - spironolactone 5057mam  -Increase torsemide 30m14md  - miralax 17g packet qam  - senna 17.2mg 56m  - keppra 750mg 63m - baclofen 10mg b29m- prazosin 1mg qhs56mhe taper is over, restart lorazepam half milligram 3 times daily as needed.  Start Haldol 2 mg, Benadryl 50 mg every 6 hours as needed for agitation.    Encourage participation in groups, sensory integration  Social work evaluation aftercare planning      Review with patient: Treatment plan reviewed with the patient.

## 2022-03-30 NOTE — Group Note (Cosign Needed)
OT Psych Group Documentation    Topics: Project      Occupations Addressed: Leisure, Social participation     Skills addressed: Fine/gross motor skills, process skills, social interaction skills, behavioral control, mental functions                  Date: 03/30/2022  Start Time: 1100  End Time: 1145    Attendance: Declined      Jamion Carter, OT/s  lic# No licensure found in state: [place]

## 2022-03-30 NOTE — Progress Notes (Signed)
Patient has been pleasant and cooperative, social. Ate in group and watched TV along with select peers. Appetite at dinner was 100% and she accepted medications readily, including prn of haldol and of nicorette gum x 2. Reported no BM and denied SI/HI/AH/VH. Patient was adherent to care and treatment at bedtime with no complaints or objection. No safety concerns or behavioral difficulties evident at the present time. No COVID symptoms. Will continue to monitor and assist as needed.

## 2022-03-30 NOTE — Group Note (Signed)
OT Psych Group Documentation    Topics: Breakfast Bites      Occupations Addressed: Activities of Daily living, Social participation, Instrumental activities of daily living (health management & maintenance)     Skills addressed: Fine/gross motor skills, process skills, social interaction skills, behavioral control, mental functions (cognitive, perceptual, affective)                   Date: 03/30/2022  Start Time: 0800  End Time: 0845    Attendance: Attended entire group  Participation/Patient Response: Attentive/able to focus, Mood/affect congruent with topic and Responsive to cues/limits/redirection from staff  Participation within a task: Demonstrated ability to focus, Demonstrated ability to make decisions, Demonstrated positive frustration tolerance, Open to assistance when offered, Organized task skills and Responsive to cues/limits/redirection from staff  Treatment Goals Addressed: Development/maintenance of a health lifestyle and Diagnosis/illness/symptom management    Comments: Calm, pleasant but with flat affect and mostly keeping to herself. Appropriate with requests, no behavioral concerns. Although she did make the poor choice of leaving the DR without her oxygen tank. MT was available to follow her with it but she continues with limited insight into the nature of her physical limitations    Beatriz Stallion, OT  lic# [id number]

## 2022-03-30 NOTE — Progress Notes (Signed)
Pt requested for prn nicorette gum for craving . Nicorette gum was administered with good effect. Pt was intermittently visible in the milieu.

## 2022-03-30 NOTE — Progress Notes (Signed)
Dr. Kirtland Bouchard and the social worker had a lengthy meeting with [facility], Christine Acworth, and [place], who have known the patient for many years. They shared that they have tried to find suitable places for the patient to live, such as a medical group home, a rest home, various programs, and even independent living arrangements. However, the patient has faced challenges in these settings. She has a history of using cocaine and inviting unauthorized visitors, which led to her eviction from independent living. Additionally, the patient struggles with mobility issues, specifically with climbing stairs, which limits the available options for her placement.    Wynona Canes suggested seeking advice from Olen Cordial regarding the patient's placement. He may be able to provide information on shelters that do not have stairs and could potentially accommodate the patient. They also mentioned the possibility of finding a medical respite for the patient, although it seems unlikely at the moment. The medical respite in [place], where the patient has been before, has multiple steps and a long waiting list.   This Clinical research associate also received an email stating from Eugenio Saenz from [place] see below:  "I just spoke w/ our connection regarding rest homes and SNFS. I explained the situation briefly, and he brought up the same fact that it will be super hard for Rest Homes to take clients on O2. He suggested, the best bet would be to go to a nursing home (we know how [name] feels about that.)   Nursing Home:  1) [facility] - They have a special mimi grant to service clients that are very difficult to place. The are inundated w/ referrals (females 3-32mo wait; males 6-92mo wait.) They will also need evidence of refusal by at least 5 homes (which we will have no problem providing.)   2) [facility] [place] (170 bed utility)  3) [facility] - she was recently there twice, and it was not a good experience for all.  He  was driving when I called, so if I do get more communication with him, I will forward along  Best,  [facility]"        [facility] [email or web address]  Office: [phone] Fax: [phone]  Christine Cericola [email or web address] [email or web address]

## 2022-03-30 NOTE — Group Note (Signed)
OT Psych Group Documentation    Topics: Feel Good      Occupations Addressed: Activities of daily living (functional mobility), Instrumental activities of daily living (health management & maintenance), Leisure, Social participation      Skills addressed: Fine/gross motor skills, social interaction skills, process skills, emotional regulation, mental functions     Date: 03/30/2022  Start Time: 1015  End Time: 1100    Attendance: Declined    Korion Cuevas, OT  lic# [id number]

## 2022-03-31 DIAGNOSIS — F1120 Opioid dependence, uncomplicated: Secondary | ICD-10-CM | POA: Diagnosis not present

## 2022-03-31 DIAGNOSIS — F331 Major depressive disorder, recurrent, moderate: Secondary | ICD-10-CM | POA: Diagnosis not present

## 2022-03-31 DIAGNOSIS — I5032 Chronic diastolic (congestive) heart failure: Secondary | ICD-10-CM | POA: Diagnosis not present

## 2022-03-31 DIAGNOSIS — R45851 Suicidal ideations: Secondary | ICD-10-CM | POA: Diagnosis not present

## 2022-03-31 LAB — HOLD PURPLE TOP TUBE

## 2022-03-31 LAB — BASIC METABOLIC PANEL
ANION GAP: 8 mmol/L — ABNORMAL LOW (ref 10–22)
BUN (UREA NITROGEN): 16 mg/dL (ref 7–18)
CALCIUM: 9.4 mg/dL (ref 8.5–10.5)
CARBON DIOXIDE: 36 mmol/L — ABNORMAL HIGH (ref 21–32)
CHLORIDE: 96 mmol/L — ABNORMAL LOW (ref 98–107)
CREATININE: 0.8 mg/dL (ref 0.4–1.2)
ESTIMATED GLOMERULAR FILT RATE: >60 mL/min (ref >60)
Glucose Random: 111 mg/dL (ref 74–160)
POTASSIUM: 4 mmol/L (ref 3.5–5.1)
SODIUM: 140 mmol/L (ref 136–145)

## 2022-03-31 LAB — NT-PROBNP: NT-proBNP: 39 pg/mL (ref 0–125)

## 2022-03-31 MED ORDER — NALOXEGOL OXALATE 12.5 MG PO TABS
25.00 mg | ORAL_TABLET | Freq: Once | ORAL | Status: AC
Start: 2022-03-31 — End: 2022-03-31
  Administered 2022-03-31: 25 mg via ORAL
  Filled 2022-03-31: qty 1
  Filled 2022-03-31: qty 2

## 2022-03-31 NOTE — Group Note (Signed)
OT Psych Group Documentation    Topics: Feel Good      Occupations Addressed: Activities of daily living (functional mobility), Instrumental activities of daily living (health management & maintenance), Leisure, Social participation      Skills addressed: Fine/gross motor skills, social interaction skills, process skills, emotional regulation, mental functions           Date: 03/31/2022  Start Time: 1015  End Time: 1100    Attendance: Declined     Shivali Quackenbush, OT  lic# [id number]

## 2022-03-31 NOTE — Progress Notes (Signed)
Met with pt who was in very good spirits today. Pt was happy to hear that she has an interview scheduled on Monday for a group adult foster care bed.   Pt is aware that this will be a lengthy process to actually get a bet and that she will likely go to a shelter or a respite from the hospital.  Pt noted to be in improved mood compared to her prior admission.    This Probation officer will reach out to [employer] team regarding discharge plan options.

## 2022-03-31 NOTE — Plan of Care (Incomplete)
Problem: Tobacco/Nicotine Abuse  Description: Encourage smoking cessation at every encounter with the patient and provide for assistance as needed and per [facility] guidelines.  Goal: Risk control - tobacco abuse  Description: Actions to eliminate or reduce tobacco use.  Outcome: Progressing    Patient received sleeping in bed with no apparent distress. Listened music on Ipad.  Ate snacks saltine cracker and gingerale. O2 sat ...% at 2 litre via nasal canula, P: /m, BP: . Independent BR used, continent. Came out to dining room this morning, .... Compliant with med. Denied pain. No SOB or respiratory distress noted. No safety concerns.

## 2022-03-31 NOTE — Plan of Care (Addendum)
Problem: Tobacco/Nicotine Abuse  Description: Encourage smoking cessation at every encounter with the patient and provide for assistance as needed and per [facility] guidelines.  Goal: Risk control - tobacco abuse  Description: Actions to eliminate or reduce tobacco use.  Outcome: Progressing     Patient received sleeping in bed with no apparent distress. Listened music on Ipad. Independent BR used, continent. Came out to dining room this morning around 6am. Compliant with med. Pt refused to check SPO2. Alert, and irritable on approach. Denied pain. No SOB or respiratory distress noted. No safety concerns.  Cheryl Zimmerman slept 5.5hrs. Will continue monitor and maintain safety checks.

## 2022-03-31 NOTE — Group Note (Signed)
OT Psych Group Documentation    Topics: Project      Occupations Addressed: Leisure, Social participation     Skills addressed: Fine/gross Chemical engineer, process skills, social interaction skills, behavioral control, mental functions                   Date: 03/31/2022  Start Time: 1100  End Time: 1145    Attendance: Declined direct invite.       Horton Marshall, OT/s  lic# No licensure found in state: [place]

## 2022-03-31 NOTE — Progress Notes (Signed)
Patient has been visible and active in the milieu. She was calm and cooperative, pleasant. She watched TV along with select peers in the Group Room, and had dinner there as well. Her appetite was 100% and she took medications as prescribed, including Movantik 25 mg po once. She reported no SOB and denied any respiratory distress. Her oxygen saturations with 02 on @ 2L via nasal cannula were 90% and 93% this evening. Otherwise, the patient was compliant with care and treatment. She reported no BM or symptoms indicative of COVID infection. Will continue to monitor and assist as needed.

## 2022-03-31 NOTE — Plan of Care (Signed)
Problem: Coping Ineffective, Individual  Description: 51 year old bipolar female with chief complaint "I am having a hard time", presents tearful, but calm and cooperative, seeking help for feelings of SI/HI and withdrawal symptoms in the context of alcohol intoxication  Goal: Demonstrates/reports adjustment to illness/condition during hospitalization  Description: Patient will use appropriate coping skills to repress her dysregulated impulses and report improved wellbeing through interactions with peers and staff on the unit   Outcome: Progressing  Patient aaox4, denies Ah/vh/si/sp, vss...........BP 132/84   Pulse 80   Temp 97.5 F (36.4 C)   Resp 18   Ht 5\' 9"  (1.753 m)   Wt (!) 147.5 kg (325 lb 3 oz)   SpO2 99%   BMI 48.02 kg/m med compliant with all schduled am , lbm 03/31/21, Appetie good, Ate 100% of her meal at breakfast and Lunch, seen at the open space/ attended and participated in groups,no agitation noted. Cont on 15 mins checks.

## 2022-03-31 NOTE — Group Note (Signed)
OT Psych Group Documentation    Topics: Breakfast Bites      Occupations Addressed: Activities of Daily living, Social participation, Instrumental activities of daily living (health management & maintenance)     Skills addressed: Fine/gross motor skills, process skills, social interaction skills, behavioral control, mental functions (cognitive, perceptual, affective)                 Date: 03/31/2022  Start Time: 0800  End Time: 0845    Attendance: Attended entire group  Participation/Patient Response: Attentive/able to focus, Mood/affect congruent with topic and Responsive to cues/limits/redirection from staff  Participation within a task: Demonstrated ability to focus, Demonstrated ability to problem solve, Demonstrated ability to make decisions, Demonstrated positive frustration tolerance, Open to assistance when offered and Responsive to cues/limits/redirection from staff  Treatment Goals Addressed: Development/maintenance of a health lifestyle and Diagnosis/illness/symptom management    Comments: Shadi presented with blunted affect, calm, quiet, responsive to staff cues. Independent and attentive with meal, able to make needs known. Asked for assistance from this writer with menu task but able to make decisions. No notable socialization with peers. Good behavioral control.     Horton Marshall, OT/s  lic# No licensure found in state: [place]

## 2022-03-31 NOTE — Plan of Care (Signed)
Problem: Coping Ineffective, Individual  Description: 51 year old bipolar female with chief complaint "I am having a hard time", presents tearful, but calm and cooperative, seeking help for feelings of SI/HI and withdrawal symptoms in the context of alcohol intoxication  Goal: Demonstrates effective coping during hospitalization  Description: The patient will continue to seek assistance for the management of her psychotic impulses throughout her hospital stay, and they will be at manageable levels prior to discharge   03/31/2022 1443 by Truett Perna, RN  Outcome: Progressing  03/31/2022 1239 by Truett Perna, RN  Outcome: Progressing   Patient aao x 4, denies Pain, ah/Vh/si/Sp, Visible in the mileu, socialized with some peers, appetite good, Ate 100% of meals at breakfast ad lunch, bathe -oob ambulating ad lib, c/o of room mate messing up the bathroom  with dirty underwears  and would like a room change,  very irritable , tearful at times and continues to state that she would not stay in that room for tonite, Md aware . Lbm 03/31/22, cont on 15 mins checks, safety precautions maintained. ....Marland KitchenBP 132/84   Pulse 80   Temp 97.5 F (36.4 C)   Resp 18   Ht 5\' 9"  (1.753 m)   Wt (!) 147.5 kg (325 lb 3 oz)   SpO2 99%   BMI 48.02 kg/m

## 2022-03-31 NOTE — Progress Notes (Signed)
PSYCH PROGRESS NOTE    Identifying Data:  Cheryl Zimmerman a 43 year oldwoman,previously residing in [facility] [place] group home but now homeless and living on the street,diagnostic psychiatric history ofPTSD, schizoaffective disorder bipolar type, polysubstance use (alcohol and cocaine currently and opiates,multipleprior psychiatric hospitalizations starting in childhood(most recent [facility] December 2021),multiple prior suicide attempts and SIB requiring medical attention but not recenytly, medical history significant forHFpEF with exacerbation in May 2023, severe head trama and seizure d/oon Keppra,loss of son to murder 1.5 years ago, presenting with SIwith plan to shoot selfand HI towards "everyone" in setting of overwhelm around grieving her son, ongoing homelessness, and alcohol withdrawal.     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  From the nursing patient is less irritable, in better  control.  Visible in milieu, but mostly in her room.  Compliant with medications, has good appetite and eats 100% of her meals.  Agreed to lab draw.  Denied groups.  Mostly spends time in her room watching Ipad.  No BM despite multiple laxatives, refused MiraLAX but took [place].  Pleasant and cooperative during the second shift, social.  Watch TV with selected peers.  No complaints or objection.  No safety concerns, no behavioral difficulties.  Slept 5.5 hours.  No respiratory distress.  Patient was seen during morning rounds.  Was found in her room, upset with her roommate because of the roommate ADLs and incontinence, asking to be moved to another room.  Demanding: Entitled, but is able to be redirected.  Wants her needs to be met immediately and becomes upset when is asked to wait.  We spoke about scheduled appointment with adult foster care for Monday, July 17 at 1 PM and the results of yesterday meeting.  The patient stated that she was in [facility] respite for 8 months and was discharged from there and was 8 weeks in [place] respit and had to leave because they discharged her.  Denies ever being in the rest homes.  Is looking forward to being placed in foster care, does not mind being in a shelter if she can stay there during the day.  The social worker communicated with Cheryl Zimmerman who  stated that the patient is inappropriate for a rest home placement.  Became upset with [employer] not providing her with housing and when discussing the situation was apartment where she was living through [place] before and was using drugs and inviting people to her house she stated that she was surrounded by alcohol and cocaine and was holding the herself together for 5 months until she relapsed.  The patient is constantly blaming somebody else or circumstances her relapses and does not take responsibility.    Social worker and the Probation officer had a long meeting with [employer] on March 30, 2022, Cheryl Zimmerman and [place], who reported that they no the patient for many years.  Attempted to place her in medical group home, rest home was, multiple programs, respites, and an evening living independently, she usually leaves at the beginning of the month when she receives her check.  From independent living she was evicted due to ongoing using cocaine and having visitors that were not allowed to be there.  The patient also has difficulty ambulating the stairs which limits the options for her placement.  Cheryl Zimmerman suggested that we talk to Cheryl Zimmerman about the patient placement and that they will help [place] with information about shelters that might be available for the patient and not having stairs.  They also will attempt to find a medical respite for the patient but it sounded unlikely as the 1 in [place] where the patient has been multiple times before has multiple steps and also has a long waiting list.    Allergies:  Review of Patient's Allergies indicates:   Bee venom               Anaphylaxis   Methylprednisolone      Dizziness, Drowsiness    Comment:Also believes syncope. Seems to tolerate             inhalational and epidural steroid.   Nutritional supplem*       Hydroxyzine             Nausea Only    Scheduled Meds:  Current Facility-Administered Medications   Medication Dose Route Frequency Last Admin   . naloxegol oxalate (MOVANTIK) tablet 25 mg  25 mg Oral Once     . potassium chloride (KLOR-CON) packet 10 mEq  10 mEq Oral Daily 10 mEq at 03/31/22 0827   . nystatin (MYCOSTATIN) powder   Topical TID Given at 03/30/22 2058   . torsemide (DEMADEX) tablet 80 mg  80 mg Oral BID 80 mg at 03/31/22 0909   . OXcarbazepine (TRILEPTAL) tablet 300 mg  300 mg Oral TID 300 mg at 03/31/22 1339   . buprenorphine-naloxone (SUBOXONE) 8-2 MG SL tablet 1 tablet  8 mg Sublingual TID 1 tablet at 03/31/22 1339   . methocarbamol (ROBAXIN) tablet 500 mg  500 mg Oral TID 500 mg at 03/31/22 1339   .  spironolactone (ALDACTONE) tablet 50 mg  50 mg Oral Daily 50 mg at 03/31/22 0827   . polyethylene glycol (GLYCOLAX/MIRALAX) packet 17 g  17 g Oral Daily 17 g at 03/31/22 0828   . sennosides (SENOKOT) tablet 17.2 mg  17.2 mg Oral Nightly 17.2 mg at 03/30/22 2012   . levETIRAcetam (KEPPRA) tablet 750 mg  750 mg Oral BID 750 mg at 03/31/22 0827   . baclofen (LIORESAL) tablet 10 mg  10 mg Oral BID 10 mg at 03/31/22 0827   . prazosin (MINIPRESS) capsule 1 mg  1  mg Oral Nightly 1 mg at 03/30/22 2012   . gabapentin (NEURONTIN) capsule 600 mg  600 mg Oral TID 600 mg at 03/31/22 1339   . levothyroxine (SYNTHROID) tablet 25 mcg  25 mcg Oral DAILY 25 mcg at 03/31/22 0602   . nicotine (NICODERM CQ) 21 MG/24HR 1 patch  1 patch Transdermal Daily 1 patch at 03/31/22 0828   . pantoprazole (PROTONIX) EC tablet 40 mg  40 mg Oral Daily 40 mg at 03/31/22 0827         PRN Meds:  Current Facility-Administered Medications   Medication Dose Route Frequency Last Admin   . diphenhydrAMINE  50 mg Oral Q6H PRN      And   . haloperidol  10 mg Oral Q6H PRN 10 mg at 03/30/22 2026   . LORazepam  0.5 mg Oral TID PRN     . nicotine polacrilex  4 mg Oral Q2H PRN 4 mg at 03/31/22 0910   . acetaminophen  650 mg Oral Q4H PRN 650 mg at 03/29/22 1852   . magnesium hydroxide  30 mL Oral Daily PRN 30 mL at 03/30/22 1439   . aluminum-magnesium hydroxide-simethicone  30 mL Oral Q2H PRN     . traZODone  50 mg Oral Nightly PRN 50 mg at 03/27/22 0340   . albuterol HFA  2 puff Inhalation Q6H PRN           VITALS:  Patient Vitals for the past 24 hrs:   BP Temp Pulse Resp SpO2   03/31/22 1339 -- -- -- 18 --   03/31/22 0828 132/84 -- -- -- --   03/30/22 2012 132/84 -- -- 18 --   03/30/22 1658 132/84 -- -- -- --   03/30/22 1612 123/84 97.5 F (36.4 C) 80 18 99 %       MENTAL STATUS EXAM:   Mental Status Exam   General Appearance: Clean;Dressed appropriately  Behavior: Cooperative  Level of Consciousness: Alert  Orientation Level: Oriented  x3  Attention/Concentration: WNL  Mannerisms/Movements: No abnormal mannerisms/movements  Speech Quality and Rate: WNL  Speech Clarity: Clear  Speech Tone: Normal vocal inflection  Vocabulary/Fund of Knowledge: WNL  Memory: Intact  Thought Process & Associations: Goal-directed  Dissociative Symptoms: None  Thought Content: No abnormalities reported or observed  Hallucinations: None  Suicidal Thoughts: Passive thoughts  Homicidal Thoughts: None  Mood: Depressed/Sad;Irritable  * Mood comment: "I feel like shit"  Affect: Congruent with mood  Judgment: Fair  Insight: Fair    C-SSRS:  Default Flowsheet Data (most recent)     C-SSRS Frequent Screener - 03/31/22 0600        C-SSRS Frequent Screener    Since last contact, have you actually had thoughts about killing yourself ?  No     Have you been thinking about how you might do this?  No     Have you had these thoughts and had some intention of acting on them?  No     Have you started to work out or worked out the details of how to kill yourself? Do you intend to carry out this plan? No     Have you done anything, started to do anything, or prepared to do anything to end your life?  No     C-SSRS Frequent Screener Risk Score No Risk                 Default Flowsheet Data (most recent)     C-SSRS Risk Assessment -  03/25/22 1245        C-SSRS Risk Assessment    Suicidal and Self-Injurious Behavior (Past 3 Months) --   denies    Suicidal and Self-Injurious Behavior (Lifetime) --   denies    Suicidal Ideation Check Most Severe in Past Month --   denies    Activating Events (Recent) Current or pending isolation or feeling alone     Treatment History Previous psychiatric diagnoses and treatments;Hopeless or dissatisfied with treatment;Non-compliant with treatment     Protective Factors (Recent) Identifies reasons for living;Fear of death or dying due to pain and suffering     Any suicidal, self-injurious, or aggressive behaviors?  Yes     Describe any suicidal, self-injurious  or aggressive behavior (INCLUDE DATES) had a serious suicidal attempt after her son was mordered 15 years ago, some cutting behavior after that                 Physical/Somatic Complaints  The patient lists: headaches          DSM-V    Primary Diagnosis: Major depressive disorder, recurrent episode, moderate   ([place])   Secondary Diagnosis: Borderline personality disorder [facility])         Informed Consent for Antipsychotics - Sun March 27, 2022     [place] Name [id number]       Informed Consent for Antipsychotics    Haloperidol (Haldol) Patient/HCP agrees with treatment plan  -GV at 03/27/22 1931          User Key  (r) = Recorded By, (t) = Taken By, (c) = Cosigned By    [place] Name Provider Type Discipline    Anabel Halon, MD Physician Psychiatrist                Assessment  [facility] PAQUINis a 68 year oldwoman,previously residing in [facility] group home but now homeless and living on the street,diagnostic psychiatric history ofPTSD, schizoaffective disorder bipolar type, polysubstance use (alcohol and cocaine currently and opiates,multipleprior psychiatric hospitalizations starting in childhood(most recent [facility] December 2021),multiple prior suicide attempts and SIB requiring medical attention but not recenytly, medical history significant forHFpEF with exacerbation in May 2023, severe head trama and seizure d/oon Keppra,loss of son to murder 1.5 years ago, presenting with SIwith plan to shoot selfand HI towards "everyone" in setting of overwhelm around grieving her son, ongoing homelessness, and alcohol withdrawal.    Plan  Regular diet  Continue preadmission medications  Continue Suboxone dose to 24 mg a day, other medications remain the same.  suboxone 8-63mtid  - methocarbamol 5047mtid  - spironolactone 5053mam  -Increase torsemide 50m66md  - miralax 17g packet qam  - senna 17.2mg 67m  - keppra 750mg 68m -  baclofen 10mg b74m- prazosin 1mg qhs65mhe taper is over, restart lorazepam half milligram 3 times daily as needed.  Start Haldol 2 mg, Benadryl 50 mg every 6 hours as needed for agitation.    Encourage participation in groups, sensory integration  Social work evaluation aftercare planning      Review with patient: Treatment plan reviewed with the patient.

## 2022-04-01 ENCOUNTER — Inpatient Hospital Stay (HOSPITAL_BASED_OUTPATIENT_CLINIC_OR_DEPARTMENT_OTHER): Payer: No Typology Code available for payment source

## 2022-04-01 DIAGNOSIS — F333 Major depressive disorder, recurrent, severe with psychotic symptoms: Secondary | ICD-10-CM

## 2022-04-01 DIAGNOSIS — I509 Heart failure, unspecified: Secondary | ICD-10-CM | POA: Diagnosis not present

## 2022-04-01 DIAGNOSIS — R45851 Suicidal ideations: Secondary | ICD-10-CM | POA: Diagnosis not present

## 2022-04-01 DIAGNOSIS — Z008 Encounter for other general examination: Secondary | ICD-10-CM

## 2022-04-01 DIAGNOSIS — F1120 Opioid dependence, uncomplicated: Secondary | ICD-10-CM | POA: Diagnosis not present

## 2022-04-01 DIAGNOSIS — F331 Major depressive disorder, recurrent, moderate: Secondary | ICD-10-CM | POA: Diagnosis not present

## 2022-04-01 DIAGNOSIS — I5032 Chronic diastolic (congestive) heart failure: Secondary | ICD-10-CM | POA: Diagnosis not present

## 2022-04-01 NOTE — Group Note (Signed)
OT Psych Group Documentation    Topics: Breakfast Bites      Occupations Addressed: Activities of Daily living, Social participation, Instrumental activities of daily living (health management & maintenance)     Skills addressed: Fine/gross motor skills, process skills, social interaction skills, behavioral control, mental functions (cognitive, perceptual, affective)     Date: 04/01/2022  Start Time: 0800  End Time: 2417    Attendance: Few minutes  Treatment Goals Addressed: Development/maintenance of a health lifestyle and Diagnosis/illness/symptom management    Comments: Arrived requesting tray, coffee and menu to eat in her room. Completed menu with assistance for vision, and left. Appropriate volume with requests, and patience.     Darcel Bayley, OT  lic# [id number]

## 2022-04-01 NOTE — Progress Notes (Addendum)
PSYCH PROGRESS NOTE    Identifying Data:  Cheryl Zimmerman a 50 year oldwoman,previously residing in [name] group home but now homeless and living on the street,diagnostic psychiatric history ofPTSD, schizoaffective disorder bipolar type, polysubstance use (alcohol and cocaine currently and opiates,multipleprior psychiatric hospitalizations starting in childhood(most recent [name]9 December 2021),multiple prior suicide attempts and SIB requiring medical attention but not recenytly, medical history significant forHFpEF with exacerbation in May 2023, severe head trama and seizure d/oon Keppra,loss of son to murder 1.5 years ago, presenting with SIwith plan to shoot selfand HI towards "everyone" in setting of overwhelm around grieving her son, ongoing homelessness, and alcohol withdrawal.     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  From the nursing patient is visible, more cooperative,  watch TV in the evening with other patients, ate 100% of dinner, compliant with medications.  She is on 2 L of oxygen during the night with 95% of saturation.  Slept through the night for 8.5 hours, no distress.  Using a walker for ambulation.  5-minute checks.  No groups.  Spends a lot of time in her room watching iPad.  Patient was seen during morning rounds.  Was found in group room, complained of heaviness in her chest, requested EKG and chest x-ray, received albuterol as needed and while waiting for technician stated that she feels much better.   The patient denied chest pain, difficulty breathing, dizziness, her vital signs were stable.  She was in a better mood today, calm, smiling.  Denied suicidal or homicidal thoughts, looking forward to evaluation on Monday from foster care agency.  Denied hallucinations.  She ate 50% of breakfast and 100% of lunch.  Took all PRNs.Towards the end of the 1st shift she complained of chest heaviness, had to use O2 otherwise her O2 saturation was 88, EKG and chest Xray were ordered. EKG had some changes but was not the best quality, Xray, no signs of pneumonia or CHF. Received Tylenol, Mylanta and Lorazepam with minimal effect. Hospitalist was called, diagnosed the patient with thorasic radiculopathy, suggested f/u in the evening and lidocaine patch, exercise and moving. The patient verbalised understanding and agreed.    Allergies:  Review  of Patient's Allergies indicates:   Bee venom               Anaphylaxis   Methylprednisolone      Dizziness, Drowsiness    Comment:Also believes syncope. Seems to tolerate             inhalational and epidural steroid.   Nutritional supplem*       Hydroxyzine             Nausea Only    Scheduled Meds:  Current Facility-Administered Medications   Medication Dose Route Frequency Last Admin   . potassium chloride (KLOR-CON) packet 10 mEq  10 mEq Oral Daily 10 mEq at 04/01/22 0846   . nystatin (MYCOSTATIN) powder   Topical TID Given at  04/01/22 1313   . torsemide (DEMADEX) tablet 80 mg  80 mg Oral BID 80 mg at 04/01/22 0845   . OXcarbazepine (TRILEPTAL) tablet 300 mg  300 mg Oral TID 300 mg at 04/01/22 1312   . buprenorphine-naloxone (SUBOXONE) 8-2 MG SL tablet 1 tablet  8 mg Sublingual TID 1 tablet at 04/01/22 1312   . methocarbamol (ROBAXIN) tablet 500 mg  500 mg Oral TID 500 mg at 04/01/22 1312   . spironolactone (ALDACTONE) tablet 50 mg  50 mg Oral Daily 50 mg at 04/01/22 0844   . polyethylene glycol (GLYCOLAX/MIRALAX) packet 17 g  17 g Oral Daily 17 g at 04/01/22 0844   . sennosides (SENOKOT) tablet 17.2 mg  17.2 mg Oral Nightly 17.2 mg at 03/31/22 1959   . levETIRAcetam (KEPPRA) tablet 750 mg  750 mg Oral BID 750 mg at 04/01/22 0858   . baclofen (LIORESAL) tablet 10 mg  10 mg Oral BID 10 mg at 04/01/22 0844   . prazosin (MINIPRESS) capsule 1 mg  1 mg Oral Nightly 1 mg at 03/31/22 1959   . gabapentin (NEURONTIN) capsule 600 mg  600 mg Oral TID 600 mg at 04/01/22 1312   . levothyroxine (SYNTHROID) tablet 25 mcg  25 mcg Oral DAILY 25 mcg at 04/01/22 0540   . nicotine (NICODERM CQ) 21 MG/24HR 1 patch  1 patch Transdermal Daily 1 patch at 04/01/22 0844   . pantoprazole (PROTONIX) EC tablet 40 mg  40 mg Oral Daily 40 mg at 04/01/22 0845         PRN Meds:  Current Facility-Administered Medications   Medication Dose Route Frequency Last Admin   . diphenhydrAMINE  50 mg Oral Q6H PRN      And   . haloperidol  10 mg Oral Q6H PRN 10 mg at 03/31/22 2004   . LORazepam  0.5 mg Oral TID PRN     . nicotine polacrilex  4 mg Oral Q2H PRN 4 mg at 04/01/22 1312   . acetaminophen  650 mg Oral Q4H PRN 650 mg at 03/29/22 1852   . magnesium hydroxide  30 mL Oral Daily PRN 30 mL at 03/30/22 1439   . aluminum-magnesium hydroxide-simethicone  30 mL Oral Q2H PRN     . traZODone  50 mg Oral Nightly PRN 50 mg at 03/27/22 0340   . albuterol HFA  2 puff Inhalation Q6H PRN           VITALS:  Patient Vitals for the past 24 hrs:   BP Temp Temp src Pulse Resp SpO2   04/01/22  1548 108/69 98.3 F (36.8 C) Oral 79 -- 94 %   04/01/22 1312 -- -- -- -- 18 --   04/01/22  0845 128/81 -- -- -- -- --   04/01/22 0844 -- -- -- -- 18 --   04/01/22 0749 128/81 -- -- 84 -- 92 %   03/31/22 1959 116/63 -- -- -- 18 --   03/31/22 1635 110/65 -- -- -- -- --       MENTAL STATUS EXAM:   Mental Status Exam   General Appearance: Clean  Behavior: Cooperative  Level of Consciousness: Alert;Fluctuating  Orientation Level: Grossly Intact  Attention/Concentration: WNL  Mannerisms/Movements: Restless with Pacing  Speech Quality and Rate: Pressured;Rapid  Speech Clarity: Clear  Speech Tone: Normal vocal inflection  Vocabulary/Fund of Knowledge: WNL  Memory: Grossly intact  Thought Process & Associations: Goal-directed;Disorganized  Dissociative Symptoms: None  Thought Content: No abnormalities reported or observed  Hallucinations: None  Suicidal Thoughts: None  Homicidal Thoughts: None  Mood: Depressed/Sad  * Mood comment: i feel weak  Affect: Congruent with mood  Judgment: Fair  Insight: Fair    C-SSRS:  Default Flowsheet Data (most recent)     C-SSRS Frequent Screener - 04/01/22 1000        C-SSRS Frequent Screener    Since last contact, have you actually had thoughts about killing yourself ?  No     Have you been thinking about how you might do this?  No     Have you done anything, started to do anything, or prepared to do anything to end your life?  No     C-SSRS Frequent Screener Risk Score No Risk                 Default Flowsheet Data (most recent)     C-SSRS Risk Assessment - 03/25/22 1245        C-SSRS Risk Assessment    Suicidal and Self-Injurious Behavior (Past 3 Months) --   denies    Suicidal and Self-Injurious Behavior (Lifetime) --   denies    Suicidal Ideation Check Most Severe in Past Month --   denies    Activating Events (Recent) Current or pending isolation or feeling alone     Treatment History Previous psychiatric diagnoses and treatments;Hopeless or dissatisfied with treatment;Non-compliant  with treatment     Protective Factors (Recent) Identifies reasons for living;Fear of death or dying due to pain and suffering     Any suicidal, self-injurious, or aggressive behaviors?  Yes     Describe any suicidal, self-injurious or aggressive behavior (INCLUDE DATES) had a serious suicidal attempt after her son was mordered 15 years ago, some cutting behavior after that                 Physical/Somatic Complaints  The patient lists: headaches          DSM-V    Primary Diagnosis: Major depressive disorder, recurrent episode, moderate   ([name])   Secondary Diagnosis: Borderline personality disorder [facility])         Informed Consent for Antipsychotics - Sun March 27, 2022     Row Name [id number]       Informed Consent for Antipsychotics    Haloperidol (Haldol) Patient/HCP agrees with treatment plan  -GV at 03/27/22 1931          User Key  (r) = Recorded By, (t) = Taken By, (c) = Cosigned By    Initials Name Provider Type Discipline    Lutricia Horsfall Earlean Shawl, MD Physician Psychiatrist                Assessment  [facility] PAQUINis  a 50 year oldwoman,previously residing in [name] but now homeless and living on the street,diagnostic psychiatric history ofPTSD, schizoaffective disorder bipolar type, polysubstance use (alcohol and cocaine currently and opiates,multipleprior psychiatric hospitalizations starting in childhood(most recent [name]9 December 2021),multiple prior suicide attempts and SIB requiring medical attention but not recenytly, medical history significant forHFpEF with exacerbation in May 2023, severe head trama and seizure d/oon Keppra,loss of son to murder 1.5 years ago, presenting with SIwith plan to shoot selfand HI towards "everyone" in setting of overwhelm around grieving her son, ongoing homelessness, and alcohol withdrawal.    Plan  Regular diet  Continue preadmission medications  Continue Suboxone dose to 24 mg a day, other medications remain the same.  suboxone 8-2mg tid  -  methocarbamol 500mg  tid  - spironolactone 50mg  qam  -Increase torsemide 80mg  bid  - miralax 17g packet qam  - senna 17.2mg  qam  - keppra 750mg  bid  - baclofen 10mg  bid  - prazosin 1mg  qhs  The taper is over, restart lorazepam half milligram 3 times daily as needed.  Start Haldol 2 mg, Benadryl 50 mg every 6 hours as needed for agitation.    Encourage participation in groups, sensory integration  Social work evaluation aftercare planning      Review with patient: Treatment plan reviewed with the patient.

## 2022-04-01 NOTE — Progress Notes (Signed)
Pt discussed in treatment team meeting and interviewed on the unit.  Pt displayed bright affect and was pleasant on approach.  Pt was aware of appointment on Monday for intake with group adult foster care program.   Pt also aware that she will need to find another place to stay upon discharge from the hospital as she works through the lengthy referral process for the [facility].    Pt states she would prefer to get into a respite program though states she is also willing to go to a shelter.      Collateral:     Writer spoke with Worthy Keeler,  [phone], at the [facility] program who is one of pt's assigned clinicians and caseworkers.   She said pt has been referred to the [facility] in [place] but there are no current openings.   This would be the preferred placement for pt as she has not tended to do well at shelters and may be banned from several shelters already.   [name] said she was trying to get in contact with staff at the [facility] in [place] to see if there are any openings.

## 2022-04-01 NOTE — Plan of Care (Signed)
Patient alert, awake and oriented x 4, complained of left rib cage pain,rated pain a "9" on a pain scale of 1 to 10. Prn apap and ativan given for anxiety  with good effect. Md aware of Ekg result, no new orders. Chest xray was  done this evening, result pending, ate 100% of meals at dinnertime, ,Pt started complaining of left rib cage pain during movt and at rest. Mylanta was given with good effect, Patient was seen by hospitalist and psychiatrist, no new orders. BP 138/68   Pulse 79   Temp 98.3 F (36.8 C) (Oral)   Resp 18   Ht 5\' 9"  (1.753 m)   Wt (!) 147.5 kg (325 lb 3 oz)   SpO2 94%   BMI 48.02 kg/m . resp easy and regular, diminishe at the lower bases.continues on o2 therapy at 2l via n/c, denies Ah/Vh/si/Sp.  Continues on 15 mins checks.

## 2022-04-01 NOTE — Progress Notes (Signed)
Pt received sleeping in bed at the start of shift in no apparent distress. Pt remained in bed sleeping for most the shift with no overnight events. Pt alert, irritable on approach. Pt denies pain, COVID sx, SI/HI/AH/VH. Pt continues on oxygen therapy at 2LMP via NC, O2sat 92%, No SOB, no respiratory distress noted. Pt is compliant with her oxygen therapy and scheduled Levothyroxine medication. PO fluid intake adequate. Pt ambulated to the bathroom using her walker, gait slow and steady, voided x 1 and return to bed. No BM reported this shift. Slept well for 8.1 hrs. 5" minutes safety checks maintained. Will continue to follow plan of care.

## 2022-04-01 NOTE — Group Note (Signed)
OT Psych Group Documentation     Topics: Feel Good      Occupations Addressed: Activities of daily living (functional mobility), Instrumental activities of daily living (health management & maintenance), Leisure, Social participation      Skills addressed: Fine/gross motor skills, social interaction skills, process skills, emotional regulation, mental functions     Date: 04/01/2022  Start Time: 1015  End Time: 1100     Attendance: Attended 1/2 of the group, came late  Participation/Patient Response: Attentive/able to focus, Contributed to discussion,  Mood/affect congruent with topic and Observed/listened  Participation within a task: Able to complete task independently after set up/demonstration, Demonstrated ability to focus, Demonstrated ability to make decisions, Demonstrated positive frustration tolerance and Responsive to cues/limits/redirection from staff  Treatment Goals Addressed: Increased coping skills and Increased socializaton/social skills    Comments: Pleasant, attentive to social activity of trivia once cued to join group vs isolated at table awaiting next group.      Darcel Bayley, OT  lic# [id number]

## 2022-04-01 NOTE — Group Note (Signed)
OT Psych Group Documentation    Topics: Project      Occupations Addressed: Leisure, Social participation     Skills addressed: Fine/gross Chemical engineer, process skills, social interaction skills, behavioral control, mental functions                   Date: 04/01/2022  Start Time: 1100  End Time: 1145    Attendance: Left early and More than half  Participation/Patient Response: Asked questions related to topic, Easily distracted, Interactive with others, Intermittent attention, Mood/affect congruent with topic and Responsive to cues/limits/redirection from staff  Participation within a task: Able to complete task independently, Demonstrated ability to focus, Demonstrated ability to make decisions, Demonstrated positive frustration tolerance, Independent initiation of stating needs/getting supplies/completing task, Needed assistance with problem solving and Responsive to cues/limits/redirection from staff  Treatment Goals Addressed: Development/maintenance of a health lifestyle and Diagnosis/illness/symptom management    Comments: Cheryl Zimmerman presented with broad affect, looking forward to group to engage in a latch-hook activity started from her previous admission. Independent and intermittently attentive to this activity, taking self-directed breaks d/t her "arthritis flaring up". Approached by MD mid-group to discuss POC, appropriate during this conversation, then decided to leave early stating "the doctor said I'm moving rooms again so I have to pack up my stuff". Unable to be redirected back to task but would like to finish latch-hook activity at a later time. Good behavioral control.     Horton Marshall, OT/s  lic# No licensure found in state: [place]

## 2022-04-01 NOTE — Plan of Care (Addendum)
Patient is alert and oriented times 4, able to verbalize her needs. Denies SI/HI.  Compliant with medications.  appetite good, Ate 50% of breakfast and 100% lunch. Pt requested and received PRN Albuterol and PRN Nicorette gum. Pt was complaining of SOB, 98% O2 on 2L NC. Pt was seen by MD.   EKG was ordered by MD and was performed by Cardiology Tech. Xray was ordered by Dr. Ronie Spies. Xray was called and paged. Awaiting from Xray to call back.  Pt comfortable at this time watching shows from tablet. Pt stated, "I feel a lot better now thank you. I don't have any pain" Pt requested for graham crackers and water. Watching shows on tablet at this time in room. LBM 7/13. Safety checks maintained.

## 2022-04-02 DIAGNOSIS — R45851 Suicidal ideations: Secondary | ICD-10-CM | POA: Diagnosis not present

## 2022-04-02 DIAGNOSIS — F1120 Opioid dependence, uncomplicated: Secondary | ICD-10-CM | POA: Diagnosis not present

## 2022-04-02 DIAGNOSIS — I5032 Chronic diastolic (congestive) heart failure: Secondary | ICD-10-CM | POA: Diagnosis not present

## 2022-04-02 DIAGNOSIS — F331 Major depressive disorder, recurrent, moderate: Secondary | ICD-10-CM | POA: Diagnosis not present

## 2022-04-02 MED ORDER — FLUTICASONE PROPIONATE 50 MCG/ACT NA SUSP
1.0000 | Freq: Every day | NASAL | Status: DC
Start: 2022-04-02 — End: 2022-04-07
  Administered 2022-04-02 – 2022-04-07 (×6): 1 via NASAL
  Filled 2022-04-02: qty 16

## 2022-04-02 NOTE — Progress Notes (Signed)
Pt received sleeping in bed at change of shift in no apparent distress. Pt was intermittently visible in the miliue. Pt is alert and oriented x4, irritable on approach and difficult to engage. Pt c/o Insomnia and Generalized pain, 7/10 , PRN Tylenol and PRN Trazodone administered at 12:12 am with good effect.  Pt denies chest pain, COVID sx, SI/HI/AH/VH. Chest x-ray result came back, result shows no Pneumonia or CHF. Pt continues on O2 therapy at 2LMP via NC, O2sat 94%, HR  61. No SOB, no respiratory distress noted.  Pt is complaint with scheduled 6 am Levothyroxine medication. Adequate PO fluid intake. Pt a is ambulatory using her walker, voided x 2. LBM 03/31/22. Slept intermittently 5.9 hrs. 5" minutes safety checks maintained. Will continue to follow plan of care.

## 2022-04-02 NOTE — Group Note (Signed)
OT Psych Group Documentation    Topics: Breakfast Bites      Occupations Addressed: Activities of Daily living, Social participation, Instrumental activities of daily living (health management & maintenance)     Skills addressed: Fine/gross motor skills, process skills, social interaction skills, behavioral control, mental functions (cognitive, perceptual, affective)                Date: 04/02/2022  Start Time: 0800  End Time: 0900    Attendance: Few minutes  Participation/Patient Response: Attentive/able to focus and Mood/affect congruent with topic  Participation within a task: Able to complete task independently and Organized task skills  Treatment Goals Addressed: Development/maintenance of a health lifestyle and Diagnosis/illness/symptom management    Comments: Pt. Obtained her food tray and coffee, requested to have it placed on her rolling walker platform which she took to her room. She expressed taking this to her room as needed to be on her oxygen. She was able to independently complete her menu and return. She appeared to consume 80% of her meal. She was in good behavioral control.    [place], [place]  lic# [id number]

## 2022-04-02 NOTE — Progress Notes (Addendum)
Identifying Data:  [name] that was admitted on 03/24/22    with below    Legal Status: Legal Status - Section 12    INTERVAL HISTORY:   Patient Active Problem List:     Dyspnea on minimal exertion     Suicidal behavior with attempted self-injury ([name])     Tobacco use disorder     Systolic congestive heart failure ([name])     Seizure disorder ([name])     Schizoaffective disorder, depressive type ([name])     Suicidal ideation     Afib ([name])     Alcohol use disorder     Borderline personality disorder ([name])     Chronic pain disorder     COPD with hypoxia ([name])     Morbid obesity ([name])     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  Ambulating with walker.  Requests resumption of Flonase.  No overnight events, Medication compliance Yes, Sleep is good, Appetite is good, No significant nursing concerns    VITALS:  BP 113/75   Pulse 75   Temp 97.7 F (36.5 C)   Resp 18   Ht 5\' 9"  (1.753 m)   Wt (!) 141.4 kg (311 lb 11.2 oz)   SpO2 95%   BMI 46.03 kg/m    Pain Score: 0 (0/10)    Scheduled Meds:  . fluticasone  1 spray Each Nostril Daily   . nystatin   Topical TID   . torsemide  80 mg Oral BID   . OXcarbazepine  300 mg Oral TID   . buprenorphine-naloxone  8 mg Sublingual TID   . methocarbamol  500 mg Oral TID   . spironolactone  50 mg Oral Daily   . polyethylene glycol  17 g Oral Daily   . sennosides  17.2 mg Oral Nightly   . levETIRAcetam  750 mg Oral BID   . baclofen  10 mg Oral BID   . prazosin  1 mg Oral Nightly   . gabapentin  600 mg Oral TID   .  levothyroxine  25 mcg Oral DAILY   . nicotine  1 patch Transdermal Daily   . pantoprazole  40 mg Oral Daily       PRN Meds:  Current Facility-Administered Medications   Medication Dose Route Frequency Last Admin   . diphenhydrAMINE  50 mg Oral Q6H PRN      And   . haloperidol  10 mg Oral Q6H PRN 10 mg at 03/31/22 2004   . LORazepam  0.5 mg Oral TID PRN 0.5 mg at 04/01/22 1628   . nicotine polacrilex  4 mg Oral Q2H PRN 4 mg at 04/02/22 1357   . acetaminophen  650 mg Oral Q4H PRN 650 mg at 04/02/22 0012   . magnesium hydroxide  30 mL Oral Daily PRN 30 mL at 03/30/22 1439   . aluminum-magnesium hydroxide-simethicone  30 mL Oral Q2H  PRN 30 mL at 04/01/22 1737   . traZODone  50 mg Oral Nightly PRN 50 mg at 04/02/22 0013   . albuterol HFA  2 puff Inhalation Q6H PRN 2 puff at 04/02/22 0852       Mental Status Examination Notable Findings:  Mental Status Exam   General Appearance: Clean  Behavior: Difficult to engage  Level of Consciousness: Alert;  Orientation Level: Grossly Intact  Attention/Concentration: WNL  Mannerisms/Movements:  WNL  Speech Quality and Rate: WNL  Speech Clarity: Clear  Speech Tone: Normal vocal inflection  Vocabulary/Fund of Knowledge: WNL  Memory: Grossly intact  Thought Process & Associations: Goal-directed;  Dissociative Symptoms: None  Thought Content: No abnormalities reported or observed  Hallucinations: None  Suicidal Thoughts: None  Homicidal Thoughts: None  Mood: Depressed/Sad  Affect: Congruent with mood, constricted  Judgment: Fair  Insight: Fair  Pleasant    C-SSRS:  Default Flowsheet Data (most recent)     C-SSRS Frequent Screener - 04/02/22 0800        C-SSRS Frequent Screener    Since last contact, have you actually had thoughts about killing yourself ?  No     Have you been thinking about how you might do this?  No     Have you done anything, started to do anything, or prepared to do anything to end your life?  No     C-SSRS Frequent Screener Risk Score No Risk                 Default  Flowsheet Data (most recent)     C-SSRS Risk Assessment - 03/25/22 1245        C-SSRS Risk Assessment    Suicidal and Self-Injurious Behavior (Past 3 Months) --   denies    Suicidal and Self-Injurious Behavior (Lifetime) --   denies    Suicidal Ideation Check Most Severe in Past Month --   denies    Activating Events (Recent) Current or pending isolation or feeling alone     Treatment History Previous psychiatric diagnoses and treatments;Hopeless or dissatisfied with treatment;Non-compliant with treatment     Protective Factors (Recent) Identifies reasons for living;Fear of death or dying due to pain and suffering     Any suicidal, self-injurious, or aggressive behaviors?  Yes     Describe any suicidal, self-injurious or aggressive behavior (INCLUDE DATES) had a serious suicidal attempt after her son was mordered 15 years ago, some cutting behavior after that                 Allergies:  Review of Patient's Allergies indicates:   Bee venom               Anaphylaxis   Methylprednisolone      Dizziness, Drowsiness    Comment:Also believes syncope. Seems to tolerate             inhalational and epidural steroid.   Nutritional supplem*       Hydroxyzine             Nausea Only      DSM-V    Primary Diagnosis: Major depressive disorder, recurrent episode, moderate   ([name])   Secondary Diagnosis: Borderline personality disorder ([name])      714 chest x-ray no pneumonia or CHF.    ASSESSMENT:   Continue treatment plan as per treatment team

## 2022-04-02 NOTE — Plan of Care (Signed)
Problem: Fall risk identification/communication (REQUIRED)  Description: REQUIRED if Fall Risk score is 5 or greater, fall score is 76.1; the patient uses an assistive device with ambulation, and is on cardiac and psychotropic medications.  Goal: Patients at risk for falling will be known to staff  Description: Fall risk assessment to be done:       Upon Admission       Transfer from one level of care to another       Whenever there is a change in patient status       After every fall event   Fall prevention precautions are initiated and implemented as per protocol.  Outcome: Progressing     Problem: Coping Ineffective, Individual  Description: 51 year old bipolar female with chief complaint "I am having a hard time", presents tearful, but calm and cooperative, seeking help for feelings of SI/HI and withdrawal symptoms in the context of alcohol intoxication  Goal: Demonstrates effective coping during hospitalization  Description: The patient will continue to seek assistance for the management of her psychotic impulses throughout her hospital stay, and they will be at manageable levels prior to discharge   Outcome: Progressing   3-11:30Pm, Alert and oriented X3, Oob in the milieu, intermittently doing word puzzles in her room, compliant with meds, denies SI/HI, AH/VH, C/O back pain 10/10, tylenol 650mg  PO given @ with good effect, pain decreased to 6/10 with moderate effect, amb with RW in the hallway, N.O flonase 1 spray daily, cont on oxygen 2L, no SOB noted, cont with C/O of back pain 7/10, tylenol 650mg  PO given at 2118 with + effect, non-skid socks on, cont on 5 minute checks for safety, LBM 7/13, ate 90% dinner, haldol 10 mg PO given @ 2118 and nicorette gums at 2100

## 2022-04-02 NOTE — Plan of Care (Signed)
Patient is alert and oriented times 4, able to verbalize her needs.  Pt received a shower today.  Intermittently visible on the unit.  Watched tv in the day room. Denies SI/HI.  Compliant with medications.  Appetite good, Ate 80% of breakfast and 100% lunch. Pt requested and received PRN Albuterol and PRN Nicorette gum. Pt comfortable at this time watching shows from tablet. Watching shows on tablet at this time in room. LBM 7/14. Safety checks maintained.

## 2022-04-03 DIAGNOSIS — F331 Major depressive disorder, recurrent, moderate: Secondary | ICD-10-CM | POA: Diagnosis not present

## 2022-04-03 DIAGNOSIS — I5032 Chronic diastolic (congestive) heart failure: Secondary | ICD-10-CM | POA: Diagnosis not present

## 2022-04-03 DIAGNOSIS — F1120 Opioid dependence, uncomplicated: Secondary | ICD-10-CM | POA: Diagnosis not present

## 2022-04-03 DIAGNOSIS — R45851 Suicidal ideations: Secondary | ICD-10-CM | POA: Diagnosis not present

## 2022-04-03 NOTE — Progress Notes (Signed)
Identifying Data:  [name] that was admitted on 03/24/22    with below    Legal Status: Legal Status - Section 12    INTERVAL HISTORY:   Patient Active Problem List:     Dyspnea on minimal exertion     Suicidal behavior with attempted self-injury ([name])     Tobacco use disorder     Systolic congestive heart failure ([name])     Seizure disorder ([name])     Schizoaffective disorder, depressive type ([name])     Suicidal ideation     Afib ([name])     Alcohol use disorder     Borderline personality disorder ([name])     Chronic pain disorder     COPD with hypoxia ([name])     Morbid obesity ([name])     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   No overnight events, Medication compliance Yes, Sleep is good, Appetite is good, No significant nursing concerns    VITALS:  BP 126/77   Pulse 88   Temp 97.3 F (36.3 C) (Axillary)   Resp 18   Ht 5\' 9"  (1.753 m)   Wt (!) 141.4 kg (311 lb 11.2 oz)   SpO2 92%   BMI 46.03 kg/m    Pain Score: 0 (0/10)    Scheduled Meds:  . fluticasone  1 spray Each Nostril Daily   . nystatin   Topical TID   . torsemide  80 mg Oral BID   . OXcarbazepine  300 mg Oral TID   . buprenorphine-naloxone  8 mg Sublingual TID   . methocarbamol  500 mg Oral TID   . spironolactone  50 mg Oral Daily   . polyethylene glycol  17 g Oral Daily   . sennosides  17.2 mg Oral Nightly   . levETIRAcetam  750 mg Oral BID   . baclofen  10 mg Oral BID   . prazosin  1 mg Oral Nightly   . gabapentin  600 mg Oral TID   . levothyroxine  25 mcg Oral  DAILY   . nicotine  1 patch Transdermal Daily   . pantoprazole  40 mg Oral Daily       PRN Meds:  Current Facility-Administered Medications   Medication Dose Route Frequency Last Admin   . diphenhydrAMINE  50 mg Oral Q6H PRN      And   . haloperidol  10 mg Oral Q6H PRN 10 mg at 04/02/22 2118   . LORazepam  0.5 mg Oral TID PRN 0.5 mg at 04/01/22 1628   . nicotine polacrilex  4 mg Oral Q2H PRN 4 mg at 04/03/22 0803   . acetaminophen  650 mg Oral Q4H PRN 650 mg at 04/03/22 0132   . magnesium hydroxide  30 mL Oral Daily PRN 30 mL at 04/03/22 0814   . aluminum-magnesium hydroxide-simethicone  30 mL Oral Q2H PRN 30  mL at 04/01/22 1737   . traZODone  50 mg Oral Nightly PRN 50 mg at 04/02/22 0013   . albuterol HFA  2 puff Inhalation Q6H PRN 2 puff at 04/03/22 0807       Mental Status Examination Notable Findings:  Mental Status Exam   General Appearance: Clean  Behavior: Withdrawn  Level of Consciousness: Drowsy  Orientation Level: Grossly Intact  Attention/Concentration: WNL  Mannerisms/Movements:  WNL  Speech Quality and Rate: WNL  Speech Clarity: Clear  Speech Tone: Normal vocal inflection  Vocabulary/Fund of Knowledge: WNL  Memory: Grossly intact  Thought Process & Associations: Goal-directed;  Dissociative Symptoms: None  Thought Content: No abnormalities reported or observed  Hallucinations: None  Suicidal Thoughts: None  Homicidal Thoughts: None  Mood: Depressed/Sad  Affect: Congruent with mood, constricted  Judgment: Fair  Insight: Fair      C-SSRS:  Default Flowsheet Data (most recent)     C-SSRS Frequent Screener - 04/03/22 0900        C-SSRS Frequent Screener    Have you been thinking about how you might do this?  No     Have you had these thoughts and had some intention of acting on them?  No     Have you started to work out or worked out the details of how to kill yourself? Do you intend to carry out this plan? No     Have you done anything, started to do anything, or prepared to do anything to end your life?   No     C-SSRS Frequent Screener Risk Score No Risk                 Default Flowsheet Data (most recent)     C-SSRS Risk Assessment - 03/25/22 1245        C-SSRS Risk Assessment    Suicidal and Self-Injurious Behavior (Past 3 Months) --   denies    Suicidal and Self-Injurious Behavior (Lifetime) --   denies    Suicidal Ideation Check Most Severe in Past Month --   denies    Activating Events (Recent) Current or pending isolation or feeling alone     Treatment History Previous psychiatric diagnoses and treatments;Hopeless or dissatisfied with treatment;Non-compliant with treatment     Protective Factors (Recent) Identifies reasons for living;Fear of death or dying due to pain and suffering     Any suicidal, self-injurious, or aggressive behaviors?  Yes     Describe any suicidal, self-injurious or aggressive behavior (INCLUDE DATES) had a serious suicidal attempt after her son was mordered 15 years ago, some cutting behavior after that                 Allergies:  Review of Patient's Allergies indicates:   Bee venom               Anaphylaxis   Methylprednisolone      Dizziness, Drowsiness    Comment:Also believes syncope. Seems to tolerate             inhalational and epidural steroid.   Nutritional supplem*       Hydroxyzine             Nausea Only      DSM-V    Primary Diagnosis: Major depressive disorder, recurrent episode, moderate   ([name])   Secondary Diagnosis: Borderline personality disorder ([name])      714 chest x-ray no pneumonia or CHF.    ASSESSMENT:   Continue treatment plan  as per treatment team

## 2022-04-03 NOTE — Progress Notes (Signed)
Pt received sleeping in bed at change of shift in no acute distress. Pt remained in bed resting through the night with no over night events. 1:32 am Pt requested and received PRN Tylenol for back and left shoulder pain, 7/10 with good effect. Alert and oriented x4, less irritable this shift and compliant with scheduled medication. Pt denies SI/HI/AH/VH. No safety or behavioral concerns noted. Pt continues on oxygen therapy, O2sat 92% at 2LMP via NC. No resp. distress, no SOB, no COVID symptoms noted. PO fluid intake adequate. Bathroom privileges as needed. LBM 04/01/22. Slept well for 7.7  hrs. 5" minutes safety checks maintained. Will continue to monitor.

## 2022-04-03 NOTE — Plan of Care (Addendum)
Patient received awake in the day room during change of shift in no apparent distress. Pt was intermittently visible in the miliue utilizing walker and compliant with O2 on 2L NC . VSS. C/O 6/10 left chronic shoulder pain and received PRN tyelonol with mild effect. Pt requested lidocaine ointment for the chronic shoulder pain; Dr. Haskell Riling was notified and paged pending results. Pt is alert and oriented x4, irritable but was able  to be redirected. Pt requested and received PRN Albuterol with good effect. O2 on 98% O2 on 2L NC. No respiratory distress noted.  Pt is compliant with medications. Requested and received PRN MOM. Pt ate 100% for breakfast and lunch.  LBM 7/14. Safety checks maintained. Pt pleasant relaxed in bed at this time watching a show on the tablet. Adequate PO fluid intake. Will continue to monitor.

## 2022-04-03 NOTE — Progress Notes (Addendum)
Patient has been visible intermittently in the milieu. Was relatively calm and quiet, complaining of having not enough diuresis from her lasix, or being on too much oxygen. Usually, the patient reported " I have to receive lasix IV for 2-3 days prior to beginning the po dose for it to work appropriately." The flow rate for the oxygen was titrated down from 2L to 1.5L and patient's saturations have been between 92% and 94%. Regarding the lack of diuresis from the lasix, the patient claims that she will discuss that with her attending tomorrow. Patient ate 100% at dinner and she accepted medications as prescribed, including a prn of Haldol 10 mg po upon request, and of tylenol 650 mg po for left shoulder pain she rated 6/10 in severity. Fluids intake adequate. Was compliant with care and treatment and appreciative of the help she got. She reported no BM and denied SI/HI/AH/VH. No COVID symptoms. Will continue to monitor for safety and assist as needed.

## 2022-04-04 DIAGNOSIS — F331 Major depressive disorder, recurrent, moderate: Secondary | ICD-10-CM | POA: Diagnosis not present

## 2022-04-04 DIAGNOSIS — I5032 Chronic diastolic (congestive) heart failure: Secondary | ICD-10-CM | POA: Diagnosis not present

## 2022-04-04 DIAGNOSIS — F1120 Opioid dependence, uncomplicated: Secondary | ICD-10-CM | POA: Diagnosis not present

## 2022-04-04 DIAGNOSIS — R45851 Suicidal ideations: Secondary | ICD-10-CM | POA: Diagnosis not present

## 2022-04-04 LAB — BASIC METABOLIC PANEL
ANION GAP: 10 mmol/L (ref 10–22)
BUN (UREA NITROGEN): 14 mg/dL (ref 7–18)
CALCIUM: 9.4 mg/dL (ref 8.5–10.5)
CARBON DIOXIDE: 33 mmol/L — ABNORMAL HIGH (ref 21–32)
CHLORIDE: 98 mmol/L (ref 98–107)
CREATININE: 0.6 mg/dL (ref 0.4–1.2)
ESTIMATED GLOMERULAR FILT RATE: >60 mL/min (ref >60)
Glucose Random: 110 mg/dL (ref 74–160)
POTASSIUM: 3.9 mmol/L (ref 3.5–5.1)
SODIUM: 140 mmol/L (ref 136–145)

## 2022-04-04 LAB — HOLD BLUE TOP TUBE

## 2022-04-04 LAB — EKG

## 2022-04-04 MED ORDER — METOLAZONE 2.5 MG PO TABS
2.5000 mg | ORAL_TABLET | Freq: Every day | ORAL | Status: DC
Start: 2022-04-05 — End: 2022-04-07
  Administered 2022-04-05 – 2022-04-07 (×3): 2.5 mg via ORAL
  Filled 2022-04-04 (×3): qty 1

## 2022-04-04 MED ORDER — LIDOCAINE 4 % EX CREA
TOPICAL_CREAM | Freq: Every day | CUTANEOUS | Status: DC | PRN
Start: 2022-04-04 — End: 2022-04-07
  Filled 2022-04-04 (×2): qty 5

## 2022-04-04 NOTE — Group Note (Signed)
OT Psych Group Documentation    Topics: Project      Occupations Addressed: Leisure, Social participation     Skills addressed: Fine/gross motor skills, process skills, social interaction skills, behavioral control, mental functions                   Date: 04/04/2022  Start Time: 1100  End Time: 1145    Attendance: Declined      Ngoc Daughtridge, OT/s  lic# No licensure found in state: [place]

## 2022-04-04 NOTE — Plan of Care (Addendum)
96.4 76 18 103/65 02 SAT 95 % RA. No noted Covid sx. No voiced SI/HI,AH/VH. Received Nicorette gum PRN x 2  Attended scheduled groups. Attended  dinner group appetite 100 %. Pt. Ambulating with Rolator, gait noted slow and steady.  Compliant with HS medications. Received Lidocaine 4% to left shoulder. Pt. Spent time in her room watching movies. No behavior or safety issues noted. Will monitor. Pt. C/o left shoulder pain a "7". Medicated with Tylenol 650 mg po at 2049. Will monitor.

## 2022-04-04 NOTE — Group Note (Signed)
OT Psych Group Documentation    Topics: Breakfast Bites     Occupations Addressed: Activities of Daily living, Social participation, Instrumental activities of daily living (health management & maintenance)     Skills addressed: Fine/gross motor skills, process skills, social interaction skills, behavioral control, mental functions (cognitive, perceptual, affective)         Date: 04/04/2022  Start Time: 0800  End Time: 0845    Attendance: Attended entire group  Participation/Patient Response: Attentive/able to focus, Contributed to discussion, Mood/affect congruent with topic, Observed/listened and Responsive to cues/limits/redirection from staff  Participation within a task: Demonstrated ability to focus, Demonstrated ability to problem solve, Demonstrated ability to make decisions, Demonstrated positive frustration tolerance, Responsive to cues/limits/redirection from staff and Social with peers  Treatment Goals Addressed: Development/maintenance of a health lifestyle and Diagnosis/illness/symptom management    Comments: Pleasant with peers, appropriate patience when waiting for needs. Attentive to meal and menu.    Darcel Bayley, OT  lic# [id number]

## 2022-04-04 NOTE — Progress Notes (Addendum)
Social Work Progress Note      Clinical Update:Pt was reviewed in clinical team meeting.Pt seen in her room by this Clinical research associate and Dr. Ronie Spies. Pt was visible sitting in bed, working on a word search. Pt would like to discharge soon, frustrated to hear that a respite or shelter placement has not been found yet. Pt shares that she is excited for intake meeting with [facility], states that she feels hopeful about this. Understands that it would be preferable to remain on the unit until we receive a response from the foster care team on whether she has been accepted. Assurances provided that the team is working to find a place for discharge.       Barriers To Discharge:  -on oxygen  -homeless      Collateral:  -intake meeting with [facility] team attended with pt, pt is not eligible for the program due to past criminal charge and substance use   -message left for Worthy Keeler,  [phone], at the [facility] program who is one of pt's assigned clinicians and caseworkers for update on placement at respite or shelter   -email sent to following members of [place] Team requesting update on discharge planning:  Worthy Keeler [email or web address]  Christine Cericola [email or web address]  Madison Tougas [email or web address]      Social Work Plan:  -Meet with patient daily to assess mood and monitor progress.  -Speak with collaterals to gain collateral information.  -Encourage patient to engage in the groups while in the unit.  -Evaluate living situation and support system.  -Coordinate treatment and discharge planning with the outpatient team.

## 2022-04-04 NOTE — Plan of Care (Signed)
Problem: Coping Ineffective, Individual  Description: 51 year old bipolar female with chief complaint "I am having a hard time", presents tearful, but calm and cooperative, seeking help for feelings of SI/HI and withdrawal symptoms in the context of alcohol intoxication  Goal: Demonstrates effective coping during hospitalization  Description: The patient will continue to seek assistance for the management of her psychotic impulses throughout her hospital stay, and they will be at manageable levels prior to discharge   Outcome: Progressing  Received resting peacefully in bed. No COVID sx. No indications of pain/SI/HI/AH/VH. 5 minute safety checks. Consumed multiple snacks throughout night. No behavioral or safety concerns.     *Sat: 98% @ 1.5 LNC. No respiratory concerns.   *LBM: 7/14  *Sleep hours: 4.9  *Code status: Full  *Legal status: Section 12

## 2022-04-04 NOTE — Group Note (Signed)
OT Psych Group Documentation    Topics: Feel Good      Occupations Addressed: Activities of daily living (functional mobility), Instrumental activities of daily living (health management & maintenance), Leisure, Social participation      Skills addressed: Fine/gross motor skills, social interaction skills, process skills, emotional regulation, mental functions     Date: 04/04/2022  Start Time: 1015  End Time: 1100    Attendance: Irving Burton, agitated    Darcel Bayley, OT  lic# [id number]

## 2022-04-04 NOTE — Progress Notes (Signed)
PSYCH PROGRESS NOTE    Identifying Data:  Cheryl Zimmerman a 50 year oldwoman,previously residing in [name] group home but now homeless and living on the street,diagnostic psychiatric history ofPTSD, schizoaffective disorder bipolar type, polysubstance use (alcohol and cocaine currently and opiates,multipleprior psychiatric hospitalizations starting in childhood(most recent [name]9 December 2021),multiple prior suicide attempts and SIB requiring medical attention but not recenytly, medical history significant forHFpEF with exacerbation in May 2023, severe head trama and seizure d/oon Keppra,loss of son to murder 1.5 years ago, presenting with SIwith plan to shoot selfand HI towards "everyone" in setting of overwhelm around grieving her son, ongoing homelessness, and alcohol withdrawal.     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  From the nursing patient is visible, calm and quiet,  complaining of having not enough to diuresis secondary to increasing diuretic.  Refused lab work in the morning.  The flow rate on oxygen was decreased from 2 L to 1.5 and the saturations remained between 90 to 94%.  Has good appetite, eats 100% of meals, is compliant with medications, no side effects.  Complained of left shoulder pain, Tylenol with some affect.  Cooperative, appreciative of care.  Denied suicidal or homicidal thoughts, denied hallucinations, had a BM.  Participated in a few groups, social and pleasant in groups.  Responsive to limitations and cues from staff.  Has difficulty with frustration tolerance.   Patient was seen during morning rounds.  Stated that she is not having good morning and was thinking about discharge soon.  Was anxious before the meeting with adult foster care.  She stated that her mood is improving, had a brighter affect.  Denied hallucinations.  Logical, pleasant.  Calm.  We later had an meeting with adult foster care nurse and social worker to determine the patient's eligibility.  She was very calm and pleasant during the meeting and easily engageable, answered all questions as she is very hopeful that things will work out.  The patient disclosed that she feels some criminal history and was arrested 3 years ago in [place] and 7 years ago in [name].  She also has a history of substance abuse which most likely will lead to  denial by foster care agency.  The nurse from [place] agency promised to talk to her management but she herself was not hopeful.  The social worker communicated today with the [facility], but could not have information about respite bed yet.     Allergies:  Review of Patient's Allergies indicates:   Bee venom               Anaphylaxis   Methylprednisolone      Dizziness, Drowsiness    Comment:Also believes syncope. Seems to tolerate             inhalational and epidural steroid.   Nutritional supplem*       Hydroxyzine             Nausea  Only    Scheduled Meds:  Current Facility-Administered Medications   Medication Dose Route Frequency Last Admin   . fluticasone (FLONASE) 50 MCG/ACT nasal spray 1 spray  1 spray Each Nostril Daily 1 spray at 04/04/22 0818   . nystatin (MYCOSTATIN) powder   Topical TID Given at 04/04/22 0818   . torsemide (DEMADEX) tablet 80 mg  80 mg Oral BID 80 mg at 04/04/22 0817   . OXcarbazepine (TRILEPTAL) tablet 300 mg  300 mg Oral TID 300 mg at 04/04/22 1416   . buprenorphine-naloxone (SUBOXONE) 8-2 MG SL tablet 1 tablet  8 mg Sublingual TID 1 tablet at 04/04/22 1416   . methocarbamol (ROBAXIN) tablet 500 mg  500 mg Oral TID 500 mg at 04/04/22 1416   . spironolactone (ALDACTONE) tablet 50 mg  50 mg Oral Daily 50 mg at 04/04/22 0817   . polyethylene glycol (GLYCOLAX/MIRALAX) packet 17 g  17 g Oral Daily 17 g at 04/04/22 0818   . sennosides (SENOKOT) tablet 17.2 mg  17.2 mg Oral Nightly 17.2 mg at 04/03/22 2003   . levETIRAcetam (KEPPRA) tablet 750 mg  750 mg Oral BID 750 mg at 04/04/22 0816   . baclofen (LIORESAL) tablet 10 mg  10 mg Oral BID 10 mg at 04/04/22 0817   . prazosin (MINIPRESS) capsule 1 mg  1 mg Oral Nightly 1 mg at 04/03/22 2012   . gabapentin (NEURONTIN) capsule 600 mg  600 mg Oral TID 600 mg at 04/04/22 1416   . levothyroxine (SYNTHROID) tablet 25 mcg  25 mcg Oral DAILY 25 mcg at 04/04/22 0525   . nicotine (NICODERM CQ) 21 MG/24HR 1 patch  1 patch Transdermal Daily 1 patch at 04/04/22 0818   . pantoprazole (PROTONIX) EC tablet 40 mg  40 mg Oral Daily 40 mg at 04/04/22 0817         PRN Meds:  Current Facility-Administered Medications   Medication Dose Route Frequency Last Admin   . diphenhydrAMINE  50 mg Oral Q6H PRN      And   . haloperidol  10 mg Oral Q6H PRN 10 mg at 04/03/22 2013   . LORazepam  0.5 mg Oral TID PRN 0.5 mg at 04/01/22 1628   . nicotine polacrilex  4 mg Oral Q2H PRN 4 mg at 04/04/22 1340   . acetaminophen  650 mg Oral Q4H PRN 650 mg at 04/04/22 1343   . magnesium hydroxide  30 mL Oral Daily PRN  30 mL at 04/03/22 0814   . aluminum-magnesium hydroxide-simethicone  30 mL Oral Q2H PRN 30 mL at 04/01/22 1737   . traZODone  50 mg Oral Nightly PRN 50 mg at 04/02/22 0013   . albuterol HFA  2 puff Inhalation  Q6H PRN 2 puff at 04/04/22 0825         VITALS:  Patient Vitals for the past 24 hrs:   BP Temp Temp src Pulse Resp SpO2   04/04/22 1540 103/65 96.4 F (35.8 C) -- 76 18 95 %   04/04/22 1416 -- -- -- -- 18 --   04/04/22 0825 125/78 -- -- 76 18 94 %   04/04/22 0817 -- -- -- -- 18 --   04/04/22 0735 113/79 97.4 F (36.3 C) -- 78 -- 92 %   04/04/22 0648 -- -- -- -- -- 98 %   04/04/22 0527 -- -- -- 77 16 98 %   04/03/22 2012 128/76 -- -- -- -- --   04/03/22 2004 -- -- -- -- 18 --   04/03/22 1739 128/76 98.1 F (36.7 C) Oral 86 18 94 %       MENTAL STATUS EXAM:   Mental Status Exam   General Appearance: Clean  Behavior: Cooperative  Level of Consciousness: Alert;Fluctuating  Orientation Level: Grossly Intact  Attention/Concentration: Fluctuating  Mannerisms/Movements: Restless with Pacing  Speech Quality and Rate: Pressured;Rapid  Speech Clarity: Clear  Speech Tone: Normal vocal inflection  Vocabulary/Fund of Knowledge: WNL  Memory: Grossly intact  Thought Process & Associations: Goal-directed;Disorganized  Dissociative Symptoms: None  Thought Content: No abnormalities reported or observed  Hallucinations: None  Suicidal Thoughts: None  Homicidal Thoughts: None  Mood: Depressed/Sad  * Mood comment: i feel weak  Affect: Congruent with mood  Judgment: Fair  Insight: Fair    C-SSRS:  Default Flowsheet Data (most recent)     C-SSRS Frequent Screener - 04/04/22 1107        C-SSRS Frequent Screener    Since last contact, have you actually had thoughts about killing yourself ?  No     Have you been thinking about how you might do this?  No     Have you had these thoughts and had some intention of acting on them?  No     Have you started to work out or worked out the details of how to kill yourself? Do you intend to carry  out this plan? No     Have you done anything, started to do anything, or prepared to do anything to end your life?  No     C-SSRS Frequent Screener Risk Score No Risk                 Default Flowsheet Data (most recent)     C-SSRS Risk Assessment - 03/25/22 1245        C-SSRS Risk Assessment    Suicidal and Self-Injurious Behavior (Past 3 Months) --   denies    Suicidal and Self-Injurious Behavior (Lifetime) --   denies    Suicidal Ideation Check Most Severe in Past Month --   denies    Activating Events (Recent) Current or pending isolation or feeling alone     Treatment History Previous psychiatric diagnoses and treatments;Hopeless or dissatisfied with treatment;Non-compliant with treatment     Protective Factors (Recent) Identifies reasons for living;Fear of death or dying due to pain and suffering     Any suicidal, self-injurious, or aggressive behaviors?  Yes     Describe any suicidal, self-injurious or aggressive behavior (INCLUDE DATES) had a serious suicidal attempt after her son was mordered 15 years ago, some cutting behavior after that  Physical/Somatic Complaints  The patient lists: headaches          DSM-V    Primary Diagnosis: Major depressive disorder, recurrent episode, moderate   ([name])   Secondary Diagnosis: Borderline personality disorder [facility])         Informed Consent for Antipsychotics - Sun March 27, 2022     Row Name [id number]       Informed Consent for Antipsychotics    Haloperidol (Haldol) Patient/HCP agrees with treatment plan  -GV at 03/27/22 1931          User Key  (r) = Recorded By, (t) = Taken By, (c) = Cosigned By    Initials Name Provider Type Discipline    Brunetta Genera, MD Physician Psychiatrist                Assessment  [facility] PAQUINis a 50 year oldwoman,previously residing in [name] but now homeless and living on the street,diagnostic psychiatric history ofPTSD, schizoaffective disorder bipolar type, polysubstance use (alcohol and cocaine  currently and opiates,multipleprior psychiatric hospitalizations starting in childhood(most recent [name]9 December 2021),multiple prior suicide attempts and SIB requiring medical attention but not recenytly, medical history significant forHFpEF with exacerbation in May 2023, severe head trama and seizure d/oon Keppra,loss of son to murder 1.5 years ago, presenting with SIwith plan to shoot selfand HI towards "everyone" in setting of overwhelm around grieving her son, ongoing homelessness, and alcohol withdrawal.    Plan  Regular diet  Continue preadmission medications  Continue Suboxone dose to 24 mg a day, other medications remain the same.  suboxone 8-2mg tid  - methocarbamol 500mg  tid  - spironolactone 50mg  qam  -Increase torsemide 80mg  bid  - miralax 17g packet qam  - senna 17.2mg  qam  - keppra 750mg  bid  - baclofen 10mg  bid  - prazosin 1mg  qhs  The taper is over, restart lorazepam half milligram 3 times daily as needed.  Start Haldol 2 mg, Benadryl 50 mg every 6 hours as needed for agitation.    Encourage participation in groups, sensory integration  Social work evaluation aftercare planning      Review with patient: Treatment plan reviewed with the patient.

## 2022-04-04 NOTE — Plan of Care (Signed)
Irritable on approach, easily frustrated,refused blood drawing. No unsafe behavior was exhibited . VSS  O2 sat WNL continues on 02 via N/C 2/l.  Marland Kitchen Pt took med's as ordered including prn nicotine gum for cigarette craving with effects and albuterol for mild SOB  . Pt ate 100% breakfast and lunch. Pt denies SI AH VH SI Pt declined group. The patient was offered the following alternatives to group attendance: individual contact with staff and journaling. Using the tablet to watch movies . Coping skills were encouraged. Will continue to assess pt and monitor for safety   Last BM 7/15   the Pt did allow to draw her blood at 1520

## 2022-04-05 DIAGNOSIS — F1120 Opioid dependence, uncomplicated: Secondary | ICD-10-CM | POA: Diagnosis not present

## 2022-04-05 DIAGNOSIS — R45851 Suicidal ideations: Secondary | ICD-10-CM | POA: Diagnosis not present

## 2022-04-05 DIAGNOSIS — F331 Major depressive disorder, recurrent, moderate: Secondary | ICD-10-CM | POA: Diagnosis not present

## 2022-04-05 DIAGNOSIS — I5032 Chronic diastolic (congestive) heart failure: Secondary | ICD-10-CM | POA: Diagnosis not present

## 2022-04-05 NOTE — Progress Notes (Signed)
Social Work Progress Note      Clinical Update:Pt was reviewed in clinical team meeting.Pt seen in her room by this Clinical research associate. Pt calm and pleasant upon approach, sitting in bed watching TV. Pt was then assisted in paying phone bill and ordering toiletries from [employer] to be delivered to the hospital tomorrow. Pt's valuables, including her phone, were then returned to the safe. Pt expressed thanks for the support. Pt requested her glasses and underwear be brought to her from pt storage closet, provided with them. Pt is willing to wait a few more days for placement, but remains eager to leave the unit.       Barriers To Discharge:  -on oxygen  -homeless      Collateral:  -coordination with [facility] team regarding discharge planning. She remains on the lengthy wait list for [facility]  -call made to [facility], no beds available today. They suggest calling back each day in the morning around 9:30       Social Work Plan:  -Meet with patient daily to assess mood and monitor progress.  -Speak with collaterals to gain collateral information.  -Encourage patient to engage in the groups while in the unit.  -Evaluate living situation and support system.  -Coordinate treatment and discharge planning with the outpatient team.

## 2022-04-05 NOTE — Group Note (Signed)
OT Psych Group Documentation    Topics: Project      Occupations Addressed: Leisure, Social participation     Skills addressed: Fine/gross Chemical engineer, process skills, social interaction skills, behavioral control, mental functions                   Date: 04/05/2022  Start Time: 1100  End Time: 1145    Attendance: Declined a direct invite    Beatriz Stallion, OT  lic# [id number]

## 2022-04-05 NOTE — Plan of Care (Addendum)
Remains very pleasant though states has more pain today.  Given Tylenol 650 mg po at 1600 along with lidocaine rub for pain 8/51 yo left shoulders and bilateral knees.  Ate double portion dinner.  No groups but uses tablet. Med compliant. Last BM on 7/18. 2000 Haldol 10 mg po per pt request.  Tylenol 650 mg po per pt request pain 8/10.

## 2022-04-05 NOTE — Plan of Care (Signed)
Pt received sleeping. Pt continue on O2 2 lit/ m via nasal canula. Independent with BR around 0530, pt was offered her schedule synthroid since pt was awake. Pt was upset that she has to take med early in morning everyday. Pt received med but refused to check her O2 sat, stated "I don't need to do that and leave her alone." No apparent distress. No Covid symptoms noted.  Slept for 5.8 hrs. Pt remain on 5 min safety checks.

## 2022-04-05 NOTE — Progress Notes (Signed)
Asked to see pt for complaints that torsemide not working well.    Reviewed previous records.   Feels like legs have more fluid than usual. With torsemide still only peeing small volume ~3x daily.  Breathing more or less at baseline, no cough.    BP: (103-117)/(65-74)   Temp:  [95.6 F (35.3 C)-96.4 F (35.8 C)]   Pulse:  [75-77]   Resp:  [18-20]   SpO2:  [88 %-95 %] RA at rest, 1.5-2L overnight  GEN appears comfortable  HEENT: normal  LUNGS: normal resp excursion, breathing easy and unlabored, no rales  CVS: rrr no mrg  ABDO: sntnd +BS  EXT: 3+ edema bilaterally to knee    Recent Labs     04/04/22  1525   NA 140   K 3.9   CL 98   CO2 33*   BUN 14   CREAT 0.6   GLUCOSER 110   CA 9.4     Impression: Does appear overall total body volume overloaded although no obvious pulmonary edema.  Creatinine can tolerate more aggressive diuresis, will want to monitor probably biweekly for potassium and magnesium.  For now I think we could try a low dose of metolazone daily to see if we can augment torsemide effect.    Peterson Lombard, MD

## 2022-04-05 NOTE — Plan of Care (Signed)
Visible in the milieu at intervals in good behavioral control.O2 sat WNL continues on O2 VIA N/C 2/l. C/O mild SOB requested prn albuterol with effects Pt took med's as ordered. Nicorette gum for cigarette craving with effects. C/O left shoulder pain , receivedtylenol and lidocaine ointment. Pt verbalized relief on reassessment. Pt ate 100% of  breakfast and lunch. Pt denies SI AH VH SI . The rash on abdominal folds improved, nystatin powder applied . The patient was offered the following alternatives to group attendance: individual contact with staff and journaling. Using the tablet to watch movies . Coping skills were encouraged. Will continue to assess pt and monitor for safety.   Last 7/18

## 2022-04-05 NOTE — Progress Notes (Addendum)
PSYCH PROGRESS NOTE    Identifying Data:  Cheryl Zimmerman a 50 year oldwoman,previously residing in [name] group home but now homeless and living on the street,diagnostic psychiatric history ofPTSD, schizoaffective disorder bipolar type, polysubstance use (alcohol and cocaine currently and opiates,multipleprior psychiatric hospitalizations starting in childhood(most recent [name]9 December 2021),multiple prior suicide attempts and SIB requiring medical attention but not recenytly, medical history significant forHFpEF with exacerbation in May 2023, severe head trama and seizure d/oon Keppra,loss of son to murder 1.5 years ago, presenting with SIwith plan to shoot selfand HI towards "everyone" in setting of overwhelm around grieving her son, ongoing homelessness, and alcohol withdrawal.     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  From the nursing patient is visible, calm and quiet, but  easily irritable and frustrated, is using Nicotine patch adn nicorette gum for nicotine craving. Has good appetite, is compliant with medications and is using prn for pain in left shoulder and anxiety. Slept through the night. participated in a few groups, but mostly stays in her room, watching Ipad.    Patient was seen during morning rounds.  Spoke about her concerns about being dc without placement as she was insisting as late as yesterday. Today she feels that it will put her medical condition in disadvantage as she has breathing problems that could be exacerbated by lack of O2 as her condenser keeps charge up to 6 hours and she will be outside for 9-11 hours. She still expressed a desire to leave [facility], but understands that she has to be patient for several days. The SW has been in communication with [facility], but iit looks like the resthomes are not an option, Requested to pay her phone bill and order some hygienic products on line and the SW will assist her with that..  I have been calling daily to the shelters, [facility] working on resp[facility] placement, but so far there are no beds available.  [facility] is suggesting SNF placement but the patient is refusing.  She is very moody and labile but denies suicidal thoughts at present.  She is alert and oriented x 3.  The patient was seen by hospitalist service yesterday because of  fluid overload.  They recommended more aggressive diuresis with control of BMP for magnesium and potassium twice a week to augment torsemide.   Allergies:  Review of Patient's Allergies indicates:   Bee venom               Anaphylaxis   Methylprednisolone      Dizziness, Drowsiness    Comment:Also believes syncope. Seems to tolerate             inhalational and epidural steroid.   Nutritional supplem*       Hydroxyzine             Nausea Only    Scheduled Meds:  Current Facility-Administered Medications   Medication Dose Route Frequency Last Admin   . metOLazone (ZAROXOLYN) tablet 2.5 mg  2.5 mg Oral Daily  2.5 mg at 04/05/22 0814   . fluticasone (FLONASE) 50 MCG/ACT nasal spray 1 spray  1 spray Each Nostril Daily 1 spray at 04/05/22 0817   . nystatin (MYCOSTATIN) powder   Topical TID Given at 04/05/22 1316   . torsemide (DEMADEX) tablet 80 mg  80 mg Oral BID 80 mg at 04/05/22 0813   . OXcarbazepine (TRILEPTAL) tablet 300 mg  300 mg Oral TID 300 mg at 04/05/22 1308   . buprenorphine-naloxone (SUBOXONE) 8-2 MG SL tablet 1 tablet  8 mg Sublingual TID 1 tablet at 04/05/22 1316   . methocarbamol (ROBAXIN) tablet 500 mg  500 mg Oral TID 500 mg at 04/05/22 1308   . spironolactone (ALDACTONE) tablet 50 mg  50 mg Oral Daily 50 mg at 04/05/22 0816   . polyethylene glycol (GLYCOLAX/MIRALAX) packet 17 g  17 g Oral Daily 17 g at 04/05/22 0813   . sennosides (SENOKOT) tablet 17.2 mg  17.2 mg Oral Nightly 17.2 mg at 04/04/22 1940   . levETIRAcetam (KEPPRA) tablet 750 mg  750 mg Oral BID 750 mg at 04/05/22 0814   . baclofen (LIORESAL) tablet 10 mg  10 mg Oral BID 10 mg at 04/05/22 0816   . prazosin (MINIPRESS) capsule 1 mg  1 mg Oral Nightly 1 mg at 04/04/22 1939   . gabapentin (NEURONTIN) capsule 600 mg  600 mg Oral TID 600 mg at 04/05/22 1308   . levothyroxine (SYNTHROID) tablet 25 mcg  25 mcg Oral DAILY 25 mcg at 04/05/22 0520   . nicotine (NICODERM CQ) 21 MG/24HR 1 patch  1 patch Transdermal Daily 1 patch at 04/05/22 0815   . pantoprazole (PROTONIX) EC tablet 40 mg  40 mg Oral Daily 40 mg at 04/05/22 0814         PRN Meds:  Current Facility-Administered Medications   Medication Dose Route Frequency Last Admin   . lidocaine   Topical Daily PRN Given at 04/05/22 1543   . diphenhydrAMINE  50 mg Oral Q6H PRN      And   . haloperidol  10 mg Oral Q6H PRN 10 mg at 04/04/22 1947   . LORazepam  0.5 mg Oral TID PRN 0.5 mg at 04/01/22 1628   . nicotine polacrilex  4 mg Oral Q2H PRN 4 mg at 04/05/22 1543   . acetaminophen  650 mg Oral Q4H PRN 650 mg at 04/05/22 1543   . magnesium hydroxide  30 mL Oral Daily PRN 30 mL at 04/03/22 0814   .  aluminum-magnesium hydroxide-simethicone  30 mL Oral Q2H PRN 30 mL at 04/01/22 1737   . traZODone  50 mg Oral Nightly PRN 50 mg at 04/02/22  0013   . albuterol HFA  2 puff Inhalation Q6H PRN 2 puff at 04/05/22 0824         VITALS:  Patient Vitals for the past 24 hrs:   BP Temp Pulse Resp SpO2   04/05/22 1550 132/84 97.6 F (36.4 C) 82 17 93 %   04/05/22 1314 102/68 -- 74 18 93 %   04/05/22 0910 -- -- 76 18 95 %   04/05/22 0814 -- -- -- 18 --   04/05/22 0713 -- -- 75 -- 95 %   04/05/22 0712 109/74 95.6 F (35.3 C) 77 -- (!) 88 %   04/04/22 1940 -- -- -- 20 --   04/04/22 1939 117/73 -- -- -- --   04/04/22 1639 103/65 -- -- -- --       MENTAL STATUS EXAM:   Mental Status Exam   General Appearance: Clean  Behavior: Cooperative  Level of Consciousness: Alert;Fluctuating  Orientation Level: Grossly Intact  Attention/Concentration: Fluctuating  Mannerisms/Movements: Restless with Pacing  Speech Quality and Rate: Pressured;Rapid  Speech Clarity: Clear  Speech Tone: Normal vocal inflection  Vocabulary/Fund of Knowledge: WNL  Memory: Grossly intact  Thought Process & Associations: Goal-directed;Disorganized  Dissociative Symptoms: None  Thought Content: No abnormalities reported or observed  Hallucinations: None  Suicidal Thoughts: None  Homicidal Thoughts: None  Mood: Depressed/Sad  * Mood comment: i feel weak  Affect: Congruent with mood  Judgment: Fair  Insight: Fair    C-SSRS:  Default Flowsheet Data (most recent)     C-SSRS Frequent Screener - 04/05/22 1003        C-SSRS Frequent Screener    Since last contact, have you actually had thoughts about killing yourself ?  No     Have you been thinking about how you might do this?  No     Have you had these thoughts and had some intention of acting on them?  No     Have you started to work out or worked out the details of how to kill yourself? Do you intend to carry out this plan? No     Have you done anything, started to do anything, or prepared to do anything to end your  life?  No     C-SSRS Frequent Screener Risk Score No Risk                 Default Flowsheet Data (most recent)     C-SSRS Risk Assessment - 03/25/22 1245        C-SSRS Risk Assessment    Suicidal and Self-Injurious Behavior (Past 3 Months) --   denies    Suicidal and Self-Injurious Behavior (Lifetime) --   denies    Suicidal Ideation Check Most Severe in Past Month --   denies    Activating Events (Recent) Current or pending isolation or feeling alone     Treatment History Previous psychiatric diagnoses and treatments;Hopeless or dissatisfied with treatment;Non-compliant with treatment     Protective Factors (Recent) Identifies reasons for living;Fear of death or dying due to pain and suffering     Any suicidal, self-injurious, or aggressive behaviors?  Yes     Describe any suicidal, self-injurious or aggressive behavior (INCLUDE DATES) had a serious suicidal attempt after her son was mordered 15 years ago, some cutting behavior after that                 Physical/Somatic Complaints  The patient lists: headaches  DSM-V    Primary Diagnosis: Major depressive disorder, recurrent episode, moderate   ([name])   Secondary Diagnosis: Borderline personality disorder [facility])         Informed Consent for Antipsychotics - Sun March 27, 2022     Row Name [id number]       Informed Consent for Antipsychotics    Haloperidol (Haldol) Patient/HCP agrees with treatment plan  -GV at 03/27/22 1931          User Key  (r) = Recorded By, (t) = Taken By, (c) = Cosigned By    Initials Name Provider Type Discipline    Brunetta Genera, MD Physician Psychiatrist                Assessment  [facility] PAQUINis a 50 year oldwoman,previously residing in [name] but now homeless and living on the street,diagnostic psychiatric history ofPTSD, schizoaffective disorder bipolar type, polysubstance use (alcohol and cocaine currently and opiates,multipleprior psychiatric hospitalizations starting in childhood(most recent [name]9 December 2021),multiple prior suicide attempts and SIB requiring medical attention but not recenytly, medical history significant forHFpEF with exacerbation in May 2023, severe head trama and seizure d/oon Keppra,loss of son to murder 1.5 years ago, presenting with SIwith plan to shoot selfand HI towards "everyone" in setting of overwhelm around grieving her son, ongoing homelessness, and alcohol withdrawal.    Plan  Regular diet  Continue preadmission medications  Continue Suboxone dose to 24 mg a day, other medications remain the same.  suboxone 8-2mg tid  - methocarbamol 500mg  tid  - spironolactone 50mg  qam  -Increase torsemide 80mg  bid               -metolazone 2.5 mg po daily with biweekly BMP checks  - miralax 17g packet qam  - senna 17.2mg  qam  - keppra 750mg  bid  - baclofen 10mg  bid  - prazosin 1mg  qhs  The taper is over, restart lorazepam half milligram 3 times daily as needed.  Start Haldol 2 mg, Benadryl 50 mg every 6 hours as needed for agitation.    Encourage participation in groups, sensory integration  Social work evaluation aftercare planning      Review with patient: Treatment plan reviewed with the patient.

## 2022-04-05 NOTE — Group Note (Signed)
OT Psych Group Documentation     Topics: Feel Good      Occupations Addressed: Activities of daily living (functional mobility), Instrumental activities of daily living (health management & maintenance), Leisure, Social participation      Skills addressed: Fine/gross motor skills, social interaction skills, process skills, emotional regulation, mental functions              Date: 04/05/2022  Start Time: 1015  End Time: 1100    Attendance: No response to invite      Horton Marshall, OT/s  lic# No licensure found in state: [place]

## 2022-04-05 NOTE — Group Note (Signed)
OT Psych Group Documentation    Topics: Breakfast Bites      Occupations Addressed: Activities of Daily living, Social participation, Instrumental activities of daily living (health management & maintenance)     Skills addressed: Fine/gross motor skills, process skills, social interaction skills, behavioral control, mental functions (cognitive, perceptual, affective)                  Date: 04/05/2022  Start Time: 0800  End Time: 0845    Attendance: Declined  Participation/Patient Response: Asked questions related to topic, Did not participate, Intrusive/disruptive behavior, Monopolizing and Responsive to cues/limits/redirection from staff  Participation within a task: Asked for assistance with task, Demonstrated ability to make decisions and Difficulty with frustration tolerance  Treatment Goals Addressed: Development/maintenance of a health lifestyle and Diagnosis/illness/symptom management    Comments: Cheryl Zimmerman presented with an irritable edge, declining to participate in group and choosing to sit in the adjacent room to complete her meal alone and independently. Cheryl Zimmerman was noted to be shouting to staff in the DR for assistance with her menu task. This Airline pilot a boundary with Cheryl Zimmerman, requiring her patience for assistance to which she responded with increased irritability. This Clinical research associate was then able to complete the menu task with Cheryl Zimmerman, providing minA and she was able to make decisions re: menu options.     Horton Marshall, OT/s  lic# No licensure found in state: [place]

## 2022-04-06 ENCOUNTER — Encounter (HOSPITAL_BASED_OUTPATIENT_CLINIC_OR_DEPARTMENT_OTHER): Payer: No Typology Code available for payment source

## 2022-04-06 DIAGNOSIS — F331 Major depressive disorder, recurrent, moderate: Secondary | ICD-10-CM | POA: Diagnosis not present

## 2022-04-06 DIAGNOSIS — F1120 Opioid dependence, uncomplicated: Secondary | ICD-10-CM | POA: Diagnosis not present

## 2022-04-06 DIAGNOSIS — R45851 Suicidal ideations: Secondary | ICD-10-CM | POA: Diagnosis not present

## 2022-04-06 DIAGNOSIS — I5032 Chronic diastolic (congestive) heart failure: Secondary | ICD-10-CM | POA: Diagnosis not present

## 2022-04-06 NOTE — Plan of Care (Signed)
Problem: Coping Ineffective, Individual  Description: 51 year old bipolar female with chief complaint "I am having a hard time", presents tearful, but calm and cooperative, seeking help for feelings of SI/HI and withdrawal symptoms in the context of alcohol intoxication  Goal: Demonstrates effective coping during hospitalization  Outcome: Progressing   Patient is visible in the milieu , mostly calm, but easily frustrated and irritated. Pt made multiple requests for PRN including : Tylenol, Haldol, Nicorette gums. VSS. No respiratory distress exhibited. Pt declined most group activities, enjoyed watching the news and movies on the I pad in lieu of groups. Pt received some items bought from [facility]: 2 bottles of shampoo, one body lotion and on bag of Tide laundry soap, all items placed in the locked cabinet. Cont on 15" safety checks.

## 2022-04-06 NOTE — Progress Notes (Signed)
PSYCH PROGRESS NOTE    Identifying Data:  Cheryl Zimmerman a 50 year oldwoman,previously residing in [name] group home but now homeless and living on the street,diagnostic psychiatric history ofPTSD, schizoaffective disorder bipolar type, polysubstance use (alcohol and cocaine currently and opiates,multipleprior psychiatric hospitalizations starting in childhood(most recent [name]9 December 2021),multiple prior suicide attempts and SIB requiring medical attention but not recenytly, medical history significant forHFpEF with exacerbation in May 2023, severe head trama and seizure d/oon Keppra,loss of son to murder 1.5 years ago, presenting with SIwith plan to shoot selfand HI towards "everyone" in setting of overwhelm around grieving her son, ongoing homelessness, and alcohol withdrawal.     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  From the nursing report:  Remains very pleasant though  states has more pain last night.  Given Tylenol 650 mg po at 1600 along with lidocaine rub for pain 8/51 yo left shoulders and bilateral knees.  Ate double portion dinner, cheeseburger and french fries as she usually does.  No groups but uses tablet. Med compliant. Last BM on 7/18. 2000 Haldol 10 mg po per pt request which has been daiy.  Tylenol 650 mg po per pt request pain 8/10. Slept 6.1 hours, on 5 minutes checks.    Patient was seen during morning rounds together with the SW.  Expressed frustration that she has not received cosmetic products that were supposed to be delivered at 9 am. The nurse explained the pt that because it is a hospital all parcels are being checked by mail office and will be delivered to the unit later. Was very upset and rude about it. Was accusatory of the writer for not caring as it was her and not me paying for those products. She later started insisting that the products should be given to her and stayed in her possession but because of rules on the unit she was told that this is not possible and became upset and loud. The nursing staff was encouraging the patient to use hospital hygienic products, but she felt that they are causing skin dryness and was not interested in using them. Every sentence from the team caused frustration and anger from the patient,  she continues to be very irritable and easily loosing control. She was reminded of that, but continued to insist. The patient usually has this type of reaction whne something goes not according to her expectations.    have been calling daily to the shelters, [facility] is working on respite placement, but so far there are no beds available.  [facility] is suggesting SNF placement but the patient is refusing.  She is very moody and labile but denies suicidal thoughts at present.  She is alert and oriented x 3.  The patient was seen by hospitalist service yesterday because of fluid overload.  They recommended more aggressive diuresis with control  of BMP for magnesium and potassium twice a week to augment torsemide.   Allergies:  Review of Patient's Allergies indicates:   Bee venom               Anaphylaxis   Methylprednisolone      Dizziness, Drowsiness    Comment:Also believes syncope. Seems to tolerate             inhalational and epidural steroid.   Nutritional supplem*       Hydroxyzine             Nausea Only    Scheduled Meds:  Current Facility-Administered Medications   Medication Dose Route Frequency Last Admin   . metOLazone (ZAROXOLYN) tablet 2.5 mg  2.5 mg Oral Daily 2.5 mg at 04/06/22 0810   . fluticasone (FLONASE) 50 MCG/ACT nasal spray 1 spray  1 spray Each Nostril Daily 1 spray at 04/06/22 0811   . nystatin (MYCOSTATIN) powder   Topical TID Given at 04/06/22 1408   . torsemide (DEMADEX) tablet 80 mg  80 mg Oral BID 80 mg at 04/06/22 0808   . OXcarbazepine (TRILEPTAL) tablet 300 mg  300 mg Oral TID 300 mg at 04/06/22 1407   . buprenorphine-naloxone (SUBOXONE) 8-2 MG SL tablet 1 tablet  8 mg Sublingual TID 1 tablet at 04/06/22 1407   . methocarbamol (ROBAXIN) tablet 500 mg  500 mg Oral TID 500 mg at 04/06/22 1406   . spironolactone (ALDACTONE) tablet 50 mg  50 mg Oral Daily 50 mg at 04/06/22 0808   . polyethylene glycol (GLYCOLAX/MIRALAX) packet 17 g  17 g Oral Daily 17 g at 04/06/22 0811   . sennosides (SENOKOT) tablet 17.2 mg  17.2 mg Oral Nightly 17.2 mg at 04/05/22 1940   . levETIRAcetam (KEPPRA) tablet 750 mg  750 mg Oral BID 750 mg at 04/06/22 0809   . baclofen (LIORESAL) tablet 10 mg  10 mg Oral BID 10 mg at 04/06/22 0808   . prazosin (MINIPRESS) capsule 1 mg  1 mg Oral Nightly 1 mg at 04/05/22 1943   . gabapentin (NEURONTIN) capsule 600 mg  600 mg Oral TID 600 mg at 04/06/22 1407   . levothyroxine (SYNTHROID) tablet 25 mcg  25 mcg Oral DAILY 25 mcg at 04/06/22 0549   . nicotine (NICODERM CQ) 21 MG/24HR 1 patch  1 patch Transdermal Daily 1 patch at 04/06/22 0807   . pantoprazole (PROTONIX) EC tablet 40 mg  40 mg Oral Daily 40 mg at  04/06/22 0814         PRN Meds:  Current Facility-Administered Medications   Medication Dose Route Frequency Last Admin   . lidocaine   Topical Daily PRN Given at 04/05/22 1543   . diphenhydrAMINE  50 mg Oral Q6H PRN      And   . haloperidol  10 mg Oral Q6H PRN 10 mg at 04/06/22 0809   . LORazepam  0.5 mg Oral TID PRN 0.5 mg at 04/01/22 1628   . nicotine polacrilex  4 mg Oral Q2H PRN 4 mg at 04/06/22 0828   . acetaminophen  650 mg Oral Q4H PRN 650 mg at 04/06/22 0830   . magnesium hydroxide  30 mL Oral Daily PRN 30 mL at 04/03/22 0814   . aluminum-magnesium hydroxide-simethicone  30 mL Oral Q2H PRN 30 mL at 04/01/22 1737   . traZODone  50 mg Oral Nightly PRN 50 mg at 04/02/22 0013   . albuterol HFA  2 puff Inhalation Q6H PRN 2 puff at 04/06/22 0815         VITALS:  Patient Vitals for the past 24 hrs:   BP Temp Pulse Resp SpO2   04/06/22 1407 -- -- -- 16 --   04/06/22 0815 -- -- 83 -- --   04/06/22 0811 -- -- -- 16 --   04/06/22 0810 109/62 -- -- -- --   04/06/22 0808 109/62 -- -- -- --   04/06/22 0726 109/62 97.6 F (36.4 C) 83 -- 90 %   04/06/22 0456 -- -- 76 18 94 %   04/05/22 1943 125/88 -- -- -- --   04/05/22 1941 -- -- -- 18 --   04/05/22 1653 132/84 -- -- -- --   04/05/22 1550 132/84 97.6 F (36.4 C) 82 17 93 %       MENTAL STATUS EXAM:   Mental Status Exam   General Appearance: Clean  Behavior: Cooperative  Level of Consciousness: Alert;Fluctuating  Orientation Level: Grossly Intact  Attention/Concentration: Fluctuating  Mannerisms/Movements: Restless with Pacing  Speech Quality and Rate: Pressured;Rapid  Speech Clarity: Clear  Speech Tone: Normal vocal inflection  Vocabulary/Fund of Knowledge: WNL  Memory: Grossly intact  Thought Process & Associations: Goal-directed;Disorganized  Dissociative Symptoms: None  Thought Content: No abnormalities reported or observed  Hallucinations: None  Suicidal Thoughts: None  Homicidal Thoughts: None  Mood: Depressed/Sad  * Mood comment: i feel weak  Affect: Congruent  with mood  Judgment: Fair  Insight: Fair    C-SSRS:  Default Flowsheet Data (most recent)     C-SSRS Frequent Screener - 04/06/22 0907        C-SSRS Frequent Screener    Since last contact, have you actually had thoughts about killing yourself ?  No     Have you been thinking about how you might do this?  No     Have you had these thoughts and had some intention of acting on them?  No     Have you started to work out or worked out the details of how to kill yourself? Do you intend to carry out this plan? No     Have you done anything, started to do anything, or prepared to do anything to end your life?  No     C-SSRS Frequent Screener Risk Score No Risk                 Default Flowsheet Data (most recent)     C-SSRS Risk Assessment - 03/25/22 1245        C-SSRS Risk Assessment    Suicidal and Self-Injurious Behavior (Past 3 Months) --   denies    Suicidal and Self-Injurious Behavior (Lifetime) --   denies    Suicidal Ideation Check Most Severe in Past Month --   denies    Activating Events (Recent)  Current or pending isolation or feeling alone     Treatment History Previous psychiatric diagnoses and treatments;Hopeless or dissatisfied with treatment;Non-compliant with treatment     Protective Factors (Recent) Identifies reasons for living;Fear of death or dying due to pain and suffering     Any suicidal, self-injurious, or aggressive behaviors?  Yes     Describe any suicidal, self-injurious or aggressive behavior (INCLUDE DATES) had a serious suicidal attempt after her son was mordered 15 years ago, some cutting behavior after that                 Physical/Somatic Complaints  The patient lists: headaches          DSM-V    Primary Diagnosis: Major depressive disorder, recurrent episode, moderate   ([name])   Secondary Diagnosis: Borderline personality disorder [facility])         Informed Consent for Antipsychotics - Sun March 27, 2022     Row Name 1610       Informed Consent for Antipsychotics    Haloperidol (Haldol)  Patient/HCP agrees with treatment plan  -GV at 03/27/22 1931          User Key  (r) = Recorded By, (t) = Taken By, (c) = Cosigned By    Initials Name Provider Type Discipline    Brunetta Genera, MD Physician Psychiatrist                Assessment  [facility] PAQUINis a 50 year oldwoman,previously residing in [name] but now homeless and living on the street,diagnostic psychiatric history ofPTSD, schizoaffective disorder bipolar type, polysubstance use (alcohol and cocaine currently and opiates,multipleprior psychiatric hospitalizations starting in childhood(most recent [name]9 December 2021),multiple prior suicide attempts and SIB requiring medical attention but not recenytly, medical history significant forHFpEF with exacerbation in May 2023, severe head trama and seizure d/oon Keppra,loss of son to murder 1.5 years ago, presenting with SIwith plan to shoot selfand HI towards "everyone" in setting of overwhelm around grieving her son, ongoing homelessness, and alcohol withdrawal.    Plan  Regular diet  Continue preadmission medications  Continue Suboxone dose to 24 mg a day, other medications remain the same.  suboxone 8-2mg tid  - methocarbamol 500mg  tid  - spironolactone 50mg  qam  -Increase torsemide 80mg  bid               -metolazone 2.5 mg po daily with biweekly BMP checks  - miralax 17g packet qam  - senna 17.2mg  qam  - keppra 750mg  bid  - baclofen 10mg  bid  - prazosin 1mg  qhs  The taper is over, restart lorazepam half milligram 3 times daily as needed.  Start Haldol 2 mg, Benadryl 50 mg every 6 hours as needed for agitation.    Encourage participation in groups, sensory integration  Social work evaluation aftercare planning      Review with patient: Treatment plan reviewed with the patient.

## 2022-04-06 NOTE — Plan of Care (Addendum)
97 74 18 92/62 02 SAT 93 % RA. No noted Covid sx. No voiced SI/HI,AH/VH. Attended scheduled groups. Attended dinner group appetite 100 %. Pt. C/o left shoulder pain a "8" on pain scale, medicated with Tylenol 650 mg po at 1707.  Pt. remained in room watching TV and napping. C/o  back spasm, received scheduled Baclofen 10 mg po with HS medications and Requested Haldol 10 mg po prn.Spasm lasted less than a minute with back rub to area. Reported no pain to back after.  No behavior or safety issues noted. Will monitor.  2159 c/o of left shoulder pain a "8" on pain scale. Medicated with Tylenol 650 mg po at 2159. Will monitor.

## 2022-04-06 NOTE — Group Note (Signed)
OT Psych Group Documentation    Topics: Breakfast Bites      Occupations Addressed: Activities of Daily living, Social participation, Instrumental activities of daily living (health management & maintenance)     Skills addressed: Fine/gross motor skills, process skills, social interaction skills, behavioral control, mental functions (cognitive, perceptual, affective)    Date: 04/06/2022  Start Time: 0800  End Time: 0845    Attendance: Attended entire group  Participation/Patient Response: Attentive/able to focus, Contributed to discussion, Mood/affect congruent with topic, Observed/listened and Responsive to cues/limits/redirection from staff  Participation within a task: Demonstrated ability to focus, Demonstrated ability to problem solve, Demonstrated ability to make decisions, Demonstrated positive frustration tolerance, Responsive to cues/limits/redirection from staff and Social with peers  Treatment Goals Addressed: Development/maintenance of a health lifestyle and Diagnosis/illness/symptom management    Comments: Pleasant with peers and this Clinical research associate, appropriate patience when waiting for needs. Attentive to meal and menu.    Darcel Bayley, OT  lic# [id number]

## 2022-04-06 NOTE — Group Note (Signed)
OT Psych Group Documentation    Topics: Project      Occupations Addressed: Leisure, Social participation     Skills addressed: Fine/gross motor skills, process skills, social interaction skills, behavioral control, mental functions                Date: 04/06/2022  Start Time: 1100  End Time: 1145    Attendance: Declined      Glenys Snader, OT/s  lic# No licensure found in state: [place]

## 2022-04-06 NOTE — Group Note (Signed)
OT Psych Group Documentation     Topics: Feel Good      Occupations Addressed: Activities of daily living (functional mobility), Instrumental activities of daily living (health management & maintenance), Leisure, Social participation      Skills addressed: Fine/gross motor skills, social interaction skills, process skills, emotional regulation, mental functions     Date: 04/06/2022  Start Time: 1015  End Time: 1100    Attendance: Declined    Asmaa Tirpak, OT  lic# [id number]

## 2022-04-06 NOTE — Plan of Care (Signed)
Pt received sleeping. Awake around 0330 and Independent with BR. O2 sat 94% on 2 lit O2. Requested and received tylenol for generalized pain at 0342 with effect. No apparent distress. No Covid symptoms noted. No behavioral issues noted. Continously redirected on keeping O2 on. Received schedule synthroid. Pt slept for 6.1 hrs. Pt continued to be on 5 min safety checks.

## 2022-04-06 NOTE — Progress Notes (Addendum)
Occupational Therapy Progress Note #1  S: "Sorry I was a b- earlier, I was having a bad morning"  O: Shaniqwa Horsman is a 51 y.o. F, BIBA on 03/23/22 from home with c/o SI/HI and alcohol withdrawal sx. Chronic SI following son's death a year and a half ago per pt report. PMH: borderline personality disorder, MDD, hx of SIB with previous SA, schizoaffective disorder bipolar type, PTSD, polysubstance use, CHF, COPD. PLOF: previously lived in group home, but is now homeless; independent with ADL and ambulates with use of rollator, historically requires assistance with IADL specifically financial mgt and home mgt. Prior [facility] to this [facility] in 11/2021-12/2021.  Pt goal: get placed in a shelter; receive services from a day program (per pt report, she is unable to do at this time because a permanent address is required to be considered for a day program)  A: Since admission, Latronda has attended approx 20% of OT groups with limited participation despite encouragement. She has enjoyed engaging in word-searches, reading, writing, using a stress ball, latch-hook, and using a tablet as coping skills. Although irritable and curt at times, Lareina's strengths incl her personability in conversation, honesty, and good sense of humor.   Today, Rainah is rec'd in the hallway, amenable to interview with this Probation officer re: progress towards to OT goals. Vallorie presents as calm, cooperative, able to be engaged in fxl activity (previously started latch-hook activity). Observed to be intermittently somnolent, stating "I just got really tired for some reason", at which point Estha was directed to engage in brief deep breathing exercise with good result. She is empathetic and honest throughout this interview, apologizing for behaviors this morning, reporting personal difficulties, and being "scared" re: future unknowns relating to her housing and ability to ultimately receive services from a day program.  Lizandra is reoriented to role of OT,  daily group schedule, and sensory cart. She independently requested more word-searches and a stuffed animal, and is provided these as additional coping skills to use on the unit and at home. When encouraged to join OT groups, she reports that she does not enjoy attending groups because she "feel[s] uncomfortable around others" and does not like the music because "most music is triggering to [her]".   Alysha will continue to benefit from skilled Psych OT services to promote day structure and socialization in order to address deficits in coping skills and symptom mgmt. Paden would also benefit from group and individual treatment with an emphasis on symptom management, sensory based treatment, improving safety, stress/anger management, communication skills/self-expression/self-advocacy, self-awareness/self-worth, functional goal setting, emotional regulation, identifying self-soothing/grounding/coping skills, exploration of warning signs/triggers that lead to hospitalization, and exploration of engagement in meaningful occupations in order to promote functional independence in daily activities/occupations in both home and community settings.   OT GOALS:  STG: Pt. Will actively utilize current coping skills of reading and journaling daily and will attend OT groups (M-F) to promote use of coping skills and behavior, by 04/08/22 (GOAL MET)  LTG: Pt. Will by 04/21/22 be able to consistently utilize her preferred coping skills and generalize to future residence. (GOAL MET)  NEW STG: Pt will improve social skills and independent use of coping skills by attending three groups per week (M-F) by 04/20/22.  NEW LTG: Pt will report three personal strengths to indicate increased self-image and reduce depressive sx by 05/04/22.  P: Engage pt in OT POC to progress towards fxl OT goals.  . Pt should be encouraged to participate in all groups and milieu  activities. OT to evaluate need for alternative treatment if appropriate.   . Pt to  receive orientation, psycho-education, and practice with sensory tool and items available on the unit to enhance opportunities to develop self-regulation strategies.   . OT to continue to assess pt's fxl, sensory, self-regulation, social, and cognitive skills, and to determine strengths and limitations in relation to pt's participation in valued occupations, and in maintaining safety.    Delena Bali, OT/s, Lic # No licensure found in state: [place]

## 2022-04-07 ENCOUNTER — Encounter (HOSPITAL_BASED_OUTPATIENT_CLINIC_OR_DEPARTMENT_OTHER): Payer: Self-pay | Admitting: Family Medicine

## 2022-04-07 ENCOUNTER — Observation Stay (HOSPITAL_COMMUNITY)
Admission: RE | Admit: 2022-04-07 | Discharge: 2022-04-09 | Disposition: A | Discharge status: Still In Hospital | Payer: No Typology Code available for payment source | Source: Ambulatory Visit | Attending: Family Medicine | Admitting: Family Medicine

## 2022-04-07 ENCOUNTER — Inpatient Hospital Stay (HOSPITAL_BASED_OUTPATIENT_CLINIC_OR_DEPARTMENT_OTHER): Payer: No Typology Code available for payment source

## 2022-04-07 ENCOUNTER — Encounter (HOSPITAL_BASED_OUTPATIENT_CLINIC_OR_DEPARTMENT_OTHER): Payer: Self-pay

## 2022-04-07 DIAGNOSIS — R079 Chest pain, unspecified: Secondary | ICD-10-CM | POA: Diagnosis not present

## 2022-04-07 DIAGNOSIS — F4310 Post-traumatic stress disorder, unspecified: Secondary | ICD-10-CM

## 2022-04-07 DIAGNOSIS — F251 Schizoaffective disorder, depressive type: Secondary | ICD-10-CM

## 2022-04-07 DIAGNOSIS — R0609 Other forms of dyspnea: Secondary | ICD-10-CM

## 2022-04-07 DIAGNOSIS — I2089 Other forms of angina pectoris: Secondary | ICD-10-CM

## 2022-04-07 DIAGNOSIS — I5032 Chronic diastolic (congestive) heart failure: Secondary | ICD-10-CM | POA: Diagnosis not present

## 2022-04-07 DIAGNOSIS — R0789 Other chest pain: Secondary | ICD-10-CM | POA: Diagnosis present

## 2022-04-07 DIAGNOSIS — Z9981 Dependence on supplemental oxygen: Secondary | ICD-10-CM

## 2022-04-07 DIAGNOSIS — F603 Borderline personality disorder: Secondary | ICD-10-CM

## 2022-04-07 DIAGNOSIS — F333 Major depressive disorder, recurrent, severe with psychotic symptoms: Secondary | ICD-10-CM

## 2022-04-07 DIAGNOSIS — J449 Chronic obstructive pulmonary disease, unspecified: Secondary | ICD-10-CM

## 2022-04-07 DIAGNOSIS — I208 Other forms of angina pectoris: Secondary | ICD-10-CM

## 2022-04-07 DIAGNOSIS — R0902 Hypoxemia: Secondary | ICD-10-CM | POA: Diagnosis not present

## 2022-04-07 DIAGNOSIS — F331 Major depressive disorder, recurrent, moderate: Secondary | ICD-10-CM | POA: Diagnosis not present

## 2022-04-07 DIAGNOSIS — R45851 Suicidal ideations: Secondary | ICD-10-CM | POA: Diagnosis not present

## 2022-04-07 DIAGNOSIS — R109 Unspecified abdominal pain: Secondary | ICD-10-CM | POA: Diagnosis not present

## 2022-04-07 DIAGNOSIS — F1120 Opioid dependence, uncomplicated: Secondary | ICD-10-CM | POA: Diagnosis not present

## 2022-04-07 LAB — CBC WITH PLATELET
ABSOLUTE NRBC COUNT: 0 10*3/uL (ref 0.0–0.0)
HEMATOCRIT: 42.4 % (ref 34.1–44.9)
HEMOGLOBIN: 14.3 g/dL (ref 11.2–15.7)
MEAN CORP HGB CONC: 33.7 g/dL (ref 31.0–37.0)
MEAN CORPUSCULAR HGB: 30 pg (ref 26.0–34.0)
MEAN CORPUSCULAR VOL: 89.1 fl (ref 80.0–100.0)
MEAN PLATELET VOLUME: 11 fL (ref 8.7–12.5)
NRBC %: 0 % (ref 0.0–0.0)
PLATELET COUNT: 387 10*3/uL (ref 150–400)
RBC DISTRIBUTION WIDTH STD DEV: 44.6 fL (ref 35.1–46.3)
RED BLOOD CELL COUNT: 4.76 M/uL (ref 3.90–5.20)
WHITE BLOOD CELL COUNT: 7.4 10*3/uL (ref 4.0–11.0)

## 2022-04-07 LAB — BASIC METABOLIC PANEL
ANION GAP: 14 mmol/L (ref 10–22)
BUN (UREA NITROGEN): 20 mg/dL — ABNORMAL HIGH (ref 7–18)
CALCIUM: 9.9 mg/dL (ref 8.5–10.5)
CARBON DIOXIDE: 36 mmol/L — ABNORMAL HIGH (ref 21–32)
CHLORIDE: 83 mmol/L — ABNORMAL LOW (ref 98–107)
CREATININE: 0.9 mg/dL (ref 0.4–1.2)
ESTIMATED GLOMERULAR FILT RATE: >60 mL/min (ref >60)
Glucose Random: 130 mg/dL (ref 74–160)
POTASSIUM: 3.1 mmol/L — ABNORMAL LOW (ref 3.5–5.1)
SODIUM: 133 mmol/L — ABNORMAL LOW (ref 136–145)

## 2022-04-07 LAB — TROPONIN T HS 3 HOUR
DELTA 3 HOUR TROPONIN T HS: 2 ng/L (ref 0–7)
TROPONIN T HS 3 HOUR RESULT: 22 ng/L — ABNORMAL HIGH (ref 0–10)

## 2022-04-07 LAB — HEPATIC FUNCTION PANEL
ALANINE AMINOTRANSFERASE: 27 U/L (ref 12–45)
ALBUMIN: 4.5 g/dL (ref 3.4–5.2)
ALKALINE PHOSPHATASE: 212 U/L — ABNORMAL HIGH (ref 45–117)
ASPARTATE AMINOTRANSFERASE: 22 U/L (ref 8–34)
BILIRUBIN DIRECT: <0.2 mg/dL (ref 0.0–0.2)
BILIRUBIN TOTAL: 0.4 mg/dL (ref 0.2–1.0)
TOTAL PROTEIN: 7.7 g/dL (ref 6.4–8.2)

## 2022-04-07 LAB — D-DIMER PE/DVT, QUANTITATIVE: D-DIMER PE/DVT, QUANTITATIVE: 622 ng/mLFEU (ref 0.00–499)

## 2022-04-07 LAB — NT-PROBNP: NT-proBNP: <36 pg/mL (ref 0–125)

## 2022-04-07 LAB — TROPONIN T HS RANDOM: TROPONIN T HS RANDOM: 24 ng/L — ABNORMAL HIGH (ref 0–10)

## 2022-04-07 LAB — TROPONIN T HS BASELINE: TROPONIN T HS BASELINE: 20 ng/L — ABNORMAL HIGH (ref 0–10)

## 2022-04-07 MED ORDER — LEVETIRACETAM 250 MG PO TABS
750.0000 mg | ORAL_TABLET | Freq: Two times a day (BID) | ORAL | Status: DC
Start: 2022-04-07 — End: 2022-04-09
  Administered 2022-04-07 – 2022-04-09 (×4): 750 mg via ORAL
  Filled 2022-04-07 (×4): qty 3

## 2022-04-07 MED ORDER — BACLOFEN 10 MG PO TABS
10.0000 mg | ORAL_TABLET | Freq: Two times a day (BID) | ORAL | Status: DC
Start: 2022-04-08 — End: 2022-04-09
  Administered 2022-04-08 – 2022-04-09 (×3): 10 mg via ORAL
  Filled 2022-04-07 (×3): qty 1

## 2022-04-07 MED ORDER — METHOCARBAMOL 500 MG PO TABS
500.0000 mg | ORAL_TABLET | Freq: Three times a day (TID) | ORAL | Status: DC
Start: 2022-04-07 — End: 2022-04-09
  Administered 2022-04-07 – 2022-04-09 (×5): 500 mg via ORAL
  Filled 2022-04-07 (×5): qty 1

## 2022-04-07 MED ORDER — NICOTINE 21 MG/24HR TD PT24
1.0000 | MEDICATED_PATCH | Freq: Every day | TRANSDERMAL | Status: DC
Start: 2022-04-08 — End: 2022-04-09
  Administered 2022-04-08 – 2022-04-09 (×2): 1 via TRANSDERMAL
  Filled 2022-04-07 (×2): qty 1

## 2022-04-07 MED ORDER — FLUTICASONE PROPIONATE 50 MCG/ACT NA SUSP
1.0000 | Freq: Every day | NASAL | Status: DC
Start: 2022-04-08 — End: 2022-04-09
  Administered 2022-04-08 – 2022-04-09 (×2): 1 via NASAL
  Filled 2022-04-07: qty 16

## 2022-04-07 MED ORDER — HEPARIN SODIUM (PORCINE) 5000 UNIT/ML IJ SOLN
5000.0000 [IU] | Freq: Two times a day (BID) | INTRAMUSCULAR | Status: DC
Start: 2022-04-07 — End: 2022-04-09
  Administered 2022-04-07: 5000 [IU] via SUBCUTANEOUS
  Filled 2022-04-07 (×4): qty 1

## 2022-04-07 MED ORDER — POTASSIUM CHLORIDE CRYS ER 20 MEQ PO TBCR
40.0000 meq | EXTENDED_RELEASE_TABLET | Freq: Two times a day (BID) | ORAL | Status: DC
Start: 2022-04-07 — End: 2022-04-09
  Administered 2022-04-07 – 2022-04-09 (×4): 40 meq via ORAL
  Filled 2022-04-07 (×4): qty 2

## 2022-04-07 MED ORDER — PRAZOSIN HCL 1 MG PO CAPS
1.0000 mg | ORAL_CAPSULE | Freq: Every evening | ORAL | Status: DC
Start: 2022-04-07 — End: 2022-04-09
  Administered 2022-04-07: 1 mg via ORAL
  Filled 2022-04-07 (×2): qty 1

## 2022-04-07 MED ORDER — POTASSIUM CHLORIDE CRYS ER 20 MEQ PO TBCR
40.0000 meq | EXTENDED_RELEASE_TABLET | Freq: Two times a day (BID) | ORAL | Status: DC
Start: 2022-04-07 — End: 2022-04-07

## 2022-04-07 MED ORDER — GABAPENTIN 300 MG PO CAPS
600.0000 mg | ORAL_CAPSULE | Freq: Three times a day (TID) | ORAL | Status: DC
Start: 2022-04-07 — End: 2022-04-09
  Administered 2022-04-07 – 2022-04-09 (×5): 600 mg via ORAL
  Filled 2022-04-07 (×5): qty 2

## 2022-04-07 MED ORDER — NYSTATIN 100000 UNIT/GM EX POWD
Freq: Three times a day (TID) | CUTANEOUS | Status: DC
Start: 2022-04-07 — End: 2022-04-09
  Filled 2022-04-07: qty 30

## 2022-04-07 MED ORDER — ASPIRIN 81 MG PO CHEW
324.0000 mg | CHEWABLE_TABLET | Freq: Every day | ORAL | Status: DC
Start: 2022-04-08 — End: 2022-04-09
  Administered 2022-04-08 – 2022-04-09 (×2): 324 mg via ORAL
  Filled 2022-04-07 (×2): qty 4

## 2022-04-07 MED ORDER — NITROGLYCERIN 0.4 MG SL SUBL
0.40 mg | SUBLINGUAL_TABLET | Freq: Once | SUBLINGUAL | Status: AC
Start: 2022-04-07 — End: 2022-04-07
  Administered 2022-04-07: 0.4 mg via SUBLINGUAL
  Filled 2022-04-07: qty 1

## 2022-04-07 MED ORDER — POLYETHYLENE GLYCOL 3350 17 G PO PACK
17.0000 g | PACK | Freq: Every day | ORAL | Status: DC
Start: 2022-04-08 — End: 2022-04-09
  Filled 2022-04-07 (×2): qty 1

## 2022-04-07 MED ORDER — METOLAZONE 2.5 MG PO TABS
2.5000 mg | ORAL_TABLET | Freq: Every day | ORAL | Status: DC
Start: 2022-04-08 — End: 2022-04-08
  Administered 2022-04-08: 2.5 mg via ORAL
  Filled 2022-04-07: qty 1

## 2022-04-07 MED ORDER — TORSEMIDE 20 MG PO TABS
80.0000 mg | ORAL_TABLET | Freq: Two times a day (BID) | ORAL | Status: DC
Start: 2022-04-08 — End: 2022-04-09
  Administered 2022-04-08 – 2022-04-09 (×3): 80 mg via ORAL
  Filled 2022-04-07 (×3): qty 1

## 2022-04-07 MED ORDER — ASPIRIN 81 MG PO CHEW
324.0000 mg | CHEWABLE_TABLET | Freq: Every day | ORAL | Status: DC
Start: 2022-04-07 — End: 2022-04-07
  Administered 2022-04-07: 324 mg via ORAL
  Filled 2022-04-07: qty 4

## 2022-04-07 MED ORDER — SPIRONOLACTONE 25 MG PO TABS
50.0000 mg | ORAL_TABLET | Freq: Every day | ORAL | Status: DC
Start: 2022-04-08 — End: 2022-04-09
  Administered 2022-04-08 – 2022-04-09 (×2): 50 mg via ORAL
  Filled 2022-04-07 (×2): qty 2

## 2022-04-07 MED ORDER — BUPRENORPHINE HCL-NALOXONE HCL 8-2 MG SL SUBL
8.0000 mg | SUBLINGUAL_TABLET | Freq: Three times a day (TID) | SUBLINGUAL | Status: DC
Start: 2022-04-07 — End: 2022-04-09
  Administered 2022-04-07 – 2022-04-09 (×5): 1 via SUBLINGUAL
  Filled 2022-04-07 (×6): qty 1

## 2022-04-07 MED ORDER — LIDOCAINE VISCOUS HCL 2 % MT SOLN
10.00 mL | Freq: Once | OROMUCOSAL | Status: AC
Start: 2022-04-07 — End: 2022-04-07
  Administered 2022-04-07: 10 mL via ORAL
  Filled 2022-04-07: qty 15

## 2022-04-07 MED ORDER — SENNOSIDES 8.6 MG PO TABS
17.2000 mg | ORAL_TABLET | Freq: Every evening | ORAL | Status: DC
Start: 2022-04-07 — End: 2022-04-09
  Administered 2022-04-07 – 2022-04-08 (×2): 17.2 mg via ORAL
  Filled 2022-04-07 (×2): qty 2

## 2022-04-07 MED ORDER — TIOTROPIUM BROMIDE MONOHYDRATE 2.5 MCG/ACT IN AERS
2.0000 | INHALATION_SPRAY | Freq: Every day | RESPIRATORY_TRACT | Status: DC
Start: 2022-04-07 — End: 2022-04-09
  Administered 2022-04-08 – 2022-04-09 (×2): 2 via RESPIRATORY_TRACT
  Filled 2022-04-07: qty 4

## 2022-04-07 MED ORDER — OXCARBAZEPINE 150 MG PO TABS
300.0000 mg | ORAL_TABLET | Freq: Three times a day (TID) | ORAL | Status: DC
Start: 2022-04-07 — End: 2022-04-09
  Administered 2022-04-07 – 2022-04-09 (×5): 300 mg via ORAL
  Filled 2022-04-07 (×5): qty 2

## 2022-04-07 MED ORDER — PANTOPRAZOLE SODIUM 40 MG PO TBEC
40.0000 mg | DELAYED_RELEASE_TABLET | Freq: Every day | ORAL | Status: DC
Start: 2022-04-08 — End: 2022-04-09
  Administered 2022-04-08 – 2022-04-09 (×2): 40 mg via ORAL
  Filled 2022-04-07 (×2): qty 1

## 2022-04-07 MED ORDER — LEVOTHYROXINE SODIUM 25 MCG PO TABS
25.0000 ug | ORAL_TABLET | Freq: Every morning | ORAL | Status: DC
Start: 2022-04-08 — End: 2022-04-09
  Administered 2022-04-08 – 2022-04-09 (×2): 25 ug via ORAL
  Filled 2022-04-07 (×2): qty 1

## 2022-04-07 MED ORDER — ALUMINUM & MAGNESIUM HYDROXIDE 200-200 MG/5ML PO SUSP
30.00 mL | Freq: Once | ORAL | Status: AC
Start: 2022-04-07 — End: 2022-04-07
  Administered 2022-04-07: 30 mL via ORAL
  Filled 2022-04-07: qty 30
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
## 2022-04-07 NOTE — Progress Notes (Signed)
PSYCH PROGRESS NOTE    Identifying Data:  MADYN IVINS a 50 year oldwoman,previously residing in [name] group home but now homeless and living on the street,diagnostic psychiatric history ofPTSD, schizoaffective disorder bipolar type, polysubstance use (alcohol and cocaine currently and opiates,multipleprior psychiatric hospitalizations starting in childhood(most recent [name]9 December 2021),multiple prior suicide attempts and SIB requiring medical attention but not recenytly, medical history significant forHFpEF with exacerbation in May 2023, severe head trama and seizure d/oon Keppra,loss of son to murder 1.5 years ago, presenting with SIwith plan to shoot selfand HI towards "everyone" in setting of overwhelm around grieving her son, ongoing homelessness, and alcohol withdrawal.     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  From the nursing report:  Remains very pleasant though  states has more pain last night.  No voiced SI/HI,AH/VH. Attended scheduled groups. Attended dinner group appetite 100 %. Pt. C/o left shoulder pain a "8" on pain scale, medicated with Tylenol 650 mg po at 1707.  Pt. remained in room watching TV and napping. C/o  back spasm, received scheduled Baclofen 10 mg po with HS medications and Requested Haldol 10 mg po prn.Spasm lasted less than a minute with back rub to area. Reported no pain to back after.  No behavior or safety issues noted. Will monitor.  2159 c/o of left shoulder pain a "8" on pain scale. Medicated with Tylenol 650 mg po at 2159. During the night was awaken by the person who was doing checks, started  yelling & swearing at the staff conducting the safety checks to close the door all the way, each time the check is conducted she get upset. Staff explained to pt the sound would be less if the door is kept ajar a little. Pt came dashiing out of her room yelling at the top of her voice all the way to the nurses station. Nurse calmed her down and she agreed to keep the door ajar. No further behavior noted. Continue on 2 L 02 denies sob or resiratory distress. 02 sat 96%. Last BM reported on 04/05/22. No behavior or safety concerns. Slept 7. 5 hours.    Patient was seen during morning rounds, was found  in her bed, complained of heaviness in her chest, received 1 dose of Nytroglycerin, and aspirin 324 mg po, EKG was performed and did not show any change,troponinx1 showed 20, which the patient had in the past. After Nitro pain completely stopped. Was seen by hospitalist who ordered the second set of tropeolins.  Cam and mellow today, in a better mood. The Sw called the shelters, no beds available at the moment, the patient is disappointed but tolerated it well. We discussed he rrequesting haldol every night and the patient admitted that she still ha s a lot of anger and it helps her with her anger and agitation. She is on a [facility] waiting list for respit.      Allergies:  Review of Patient's Allergies indicates:   Bee venom               Anaphylaxis   Methylprednisolone      Dizziness, Drowsiness    Comment:Also believes syncope. Seems to tolerate             inhalational and epidural steroid.   Nutritional supplem*       Hydroxyzine             Nausea Only    Scheduled Meds:  Current Facility-Administered Medications   Medication Dose Route Frequency Last Admin   . aspirin chewable tablet 324 mg  324 mg Oral Daily 324 mg at 04/07/22 1253   . potassium chloride SA (K-DUR) CR tablet 40 mEq  40 mEq Oral BID     . metOLazone (ZAROXOLYN) tablet 2.5 mg  2.5 mg Oral Daily 2.5 mg at 04/07/22 0825   . fluticasone (FLONASE) 50 MCG/ACT nasal spray 1 spray  1 spray Each Nostril Daily 1 spray at 04/07/22 0828   . nystatin (MYCOSTATIN) powder   Topical TID Given at 04/07/22 1331   . torsemide (DEMADEX) tablet 80 mg  80 mg Oral BID 80 mg at 04/07/22 0826   . OXcarbazepine (TRILEPTAL) tablet 300 mg  300 mg Oral TID 300 mg at 04/07/22 1321   . buprenorphine-naloxone (SUBOXONE) 8-2 MG SL tablet 1 tablet  8 mg Sublingual TID 1 tablet at 04/07/22 1331   . methocarbamol (ROBAXIN) tablet 500 mg  500 mg Oral TID 500 mg at 04/07/22 1320   . spironolactone (ALDACTONE) tablet 50 mg  50 mg Oral Daily 50 mg at 04/07/22 0825   . polyethylene glycol (GLYCOLAX/MIRALAX) packet 17 g  17 g Oral Daily 17 g at 04/07/22 0827   . sennosides (SENOKOT) tablet 17.2 mg  17.2 mg Oral Nightly 17.2 mg at 04/06/22 1949   . levETIRAcetam (KEPPRA) tablet 750 mg  750 mg Oral BID 750 mg at 04/07/22 0825   . baclofen (LIORESAL) tablet 10 mg  10 mg Oral BID 10 mg at 04/07/22 0826   . prazosin (MINIPRESS) capsule 1 mg  1 mg Oral Nightly 1 mg at 04/06/22 1951   . gabapentin (NEURONTIN) capsule 600 mg  600 mg Oral TID 600 mg at 04/07/22 1320   . levothyroxine (SYNTHROID) tablet 25 mcg  25 mcg Oral DAILY 25 mcg at 04/07/22 0624   . nicotine (NICODERM CQ) 21 MG/24HR 1 patch  1 patch Transdermal Daily 1 patch at 04/07/22 0827    . pantoprazole (PROTONIX) EC tablet 40 mg  40 mg Oral Daily 40 mg at 04/07/22 0825         PRN Meds:  Current Facility-Administered Medications   Medication Dose Route Frequency Last Admin   .  lidocaine   Topical Daily PRN Given at 04/05/22 1543   . diphenhydrAMINE  50 mg Oral Q6H PRN      And   . haloperidol  10 mg Oral Q6H PRN 10 mg at 04/06/22 1950   . LORazepam  0.5 mg Oral TID PRN 0.5 mg at 04/01/22 1628   . nicotine polacrilex  4 mg Oral Q2H PRN 4 mg at 04/07/22 1325   . acetaminophen  650 mg Oral Q4H PRN 650 mg at 04/06/22 2159   . magnesium hydroxide  30 mL Oral Daily PRN 30 mL at 04/03/22 0814   . aluminum-magnesium hydroxide-simethicone  30 mL Oral Q2H PRN 30 mL at 04/01/22 1737   . traZODone  50 mg Oral Nightly PRN 50 mg at 04/02/22 0013   . albuterol HFA  2 puff Inhalation Q6H PRN 2 puff at 04/06/22 0815         VITALS:  Patient Vitals for the past 24 hrs:   BP Temp Temp src Pulse Resp SpO2   04/07/22 1323 -- -- -- -- -- 95 %   04/07/22 1322 126/87 -- -- 90 18 100 %   04/07/22 1132 110/65 -- -- 80 18 96 %   04/07/22 1121 114/74 -- -- 90 20 95 %   04/07/22 1007 -- -- -- 100 18 92 %   04/07/22 1006 114/80 -- -- 98 20 90 %   04/07/22 0825 130/74 97.5 F (36.4 C) TEMPORAL -- 18 92 %   04/07/22 0616 -- -- -- -- -- 96 %   04/07/22 0614 -- -- -- -- -- (!) 88 %   04/06/22 1951 107/76 -- -- 76 20 94 %   04/06/22 1950 -- -- -- -- 20 --   04/06/22 1707 120/83 -- -- -- -- --   04/06/22 1555 92/62 97 F (36.1 C) TEMPORAL 74 18 93 %       MENTAL STATUS EXAM:   Mental Status Exam   General Appearance: Clean  Behavior: Cooperative  Level of Consciousness: Alert;Fluctuating  Orientation Level: Grossly Intact  Attention/Concentration: Fluctuating  Mannerisms/Movements: Restless with Pacing  Speech Quality and Rate: Pressured;Rapid  Speech Clarity: Clear  Speech Tone: Normal vocal inflection  Vocabulary/Fund of Knowledge: WNL  Memory: Grossly intact  Thought Process & Associations:  Goal-directed;Disorganized  Dissociative Symptoms: None  Thought Content: No abnormalities reported or observed  Hallucinations: None  Suicidal Thoughts: None  Homicidal Thoughts: None  Mood: Depressed/Sad;Irritable  * Mood comment: Anxious  Affect: Congruent with mood  Judgment: Fair  Insight: Fair    C-SSRS:  Default Flowsheet Data (most recent)     C-SSRS Frequent Screener - 04/07/22 1031        C-SSRS Frequent Screener    Since last contact, have you actually had thoughts about killing yourself ?  No     Have you been thinking about how you might do this?  No     Have you had these thoughts and had some intention of acting on them?  No     Have you started to work out or worked out the details of how to kill yourself? Do you intend to carry out this plan? No     Have you done anything, started to do anything, or prepared to do anything to end your life?  No     C-SSRS Frequent Screener Risk Score No Risk                 Default  Flowsheet Data (most recent)     C-SSRS Risk Assessment - 03/25/22 1245        C-SSRS Risk Assessment    Suicidal and Self-Injurious Behavior (Past 3 Months) --   denies    Suicidal and Self-Injurious Behavior (Lifetime) --   denies    Suicidal Ideation Check Most Severe in Past Month --   denies    Activating Events (Recent) Current or pending isolation or feeling alone     Treatment History Previous psychiatric diagnoses and treatments;Hopeless or dissatisfied with treatment;Non-compliant with treatment     Protective Factors (Recent) Identifies reasons for living;Fear of death or dying due to pain and suffering     Any suicidal, self-injurious, or aggressive behaviors?  Yes     Describe any suicidal, self-injurious or aggressive behavior (INCLUDE DATES) had a serious suicidal attempt after her son was mordered 15 years ago, some cutting behavior after that                 Physical/Somatic Complaints  The patient lists: headaches          DSM-V    Primary Diagnosis: Major depressive  disorder, recurrent episode, moderate   ([name])   Secondary Diagnosis: Borderline personality disorder [facility])         Informed Consent for Antipsychotics - Sun March 27, 2022     Row Name [id number]       Informed Consent for Antipsychotics    Haloperidol (Haldol) Patient/HCP agrees with treatment plan  -GV at 03/27/22 1931          User Key  (r) = Recorded By, (t) = Taken By, (c) = Cosigned By    Initials Name Provider Type Discipline    Brunetta Genera, MD Physician Psychiatrist                Assessment  [facility] PAQUINis a 50 year oldwoman,previously residing in [name] group home but now homeless and living on the street,diagnostic psychiatric history ofPTSD, schizoaffective disorder bipolar type, polysubstance use (alcohol and cocaine currently and opiates,multipleprior psychiatric hospitalizations starting in childhood(most recent [name]9 December 2021),multiple prior suicide attempts and SIB requiring medical attention but not recenytly, medical history significant forHFpEF with exacerbation in May 2023, severe head trama and seizure d/oon Keppra,loss of son to murder 1.5 years ago, presenting with SIwith plan to shoot selfand HI towards "everyone" in setting of overwhelm around grieving her son, ongoing homelessness, and alcohol withdrawal.    Plan  Regular diet  Continue preadmission medications  Continue Suboxone dose to 24 mg a day, other medications remain the same.  suboxone 8-2mg tid  - methocarbamol 500mg  tid  - spironolactone 50mg  qam  -Increase torsemide 80mg  bid               -metolazone 2.5 mg po daily with biweekly BMP checks  - miralax 17g packet qam  - senna 17.2mg  qam  - keppra 750mg  bid  - baclofen 10mg  bid  - prazosin 1mg  qhs  Started on AA 324 mg po daily  The taper is over, restart lorazepam half milligram 3 times daily as needed.  Start Haldol 2 mg, Benadryl 50 mg every 6 hours as  needed for agitation.    Encourage participation in groups, sensory integration  Social work evaluation aftercare planning      Review with patient: Treatment plan reviewed with the patient.

## 2022-04-07 NOTE — Plan of Care (Addendum)
Pt arrived unit from [place] [facility] @1720 . Pt A/o x4. Respirations even and unlabored. Skin warm and dry. NSR on the monitor. SpO2 94% on 2L via NC. Pt denies pain, SOB, N/V and dizziness. Pt to bathroom using walker and 1 Assist. Pt c/o having to urinate too often. Purewick applied. EKG done and labs drawn per order. Pt  Calm and cooperative. Compliant with medicated. Pt given call light and bed alarm on for safety. Pt appears to be resting comfortably. .      Rolling walker, two pair glasses, two brown bags, One blue bin and two duffle bags with patient at bedside. Clothes, Toiletries, books and shoes in bags.  No valuables found. Per patient valuables sheet Envelope [phone] sent to safe with 1 yellow ring, 1 yellow necklace, 7 debit/credit cards, 1 samsung cell phone, 1 charger base and 1 charger cord. No RN signature noted from [place] [facility].            Problem: Daily Care  Goal: Daily care needs are met  Description: Assess and monitor ability to perform self care and identify potential discharge needs.  Outcome: Progressing     Problem: Pain  Goal: Patient's pain/discomfort is manageable  Description: Assess and monitor patient's pain using appropriate pain scale. Collaborate with interdisciplinary team and initiate plan and interventions as ordered. Re-assess patient's pain level 30 - 60 minutes after pain management intervention.   Outcome: Progressing     Problem: Compromised Skin Integrity  Description: Use this problem when pressure ulcers are classified as stage I or II.  Goal: Skin integrity is maintained or improved  Description: Assess and monitor skin integrity. Identify patients at risk for skin breakdown on admission and per policy. Collaborate with interdisciplinary team and initiate plans and interventions as needed.  Outcome: Progressing     Problem: Knowledge Deficit  Goal: Patient/S.O. demonstrates understanding of disease process, treatment plan, medications, and discharge instructions.  Description:  Complete learning assessment and assess knowledge base  Outcome: Progressing     Problem: Discharge Barriers  Goal: Patient's continuum of care needs are met  Description: Collaborate with interdisciplinary team and initiate plans and interventions as needed.   Outcome: Progressing

## 2022-04-07 NOTE — Group Note (Signed)
OT Psych Group Documentation     Topics: Feel Good      Occupations Addressed: Activities of daily living (functional mobility), Instrumental activities of daily living (health management & maintenance), Leisure, Social participation      Skills addressed: Fine/gross motor skills, social interaction skills, process skills, emotional regulation, mental functions     Date: 04/07/2022  Start Time: 1015  End Time: 1100    Attendance: Declined    Dena Esperanza, OT  lic# [id number]

## 2022-04-07 NOTE — RN Shift Note (Signed)
97 94 20 113/61 02 SAT 94 % 02 2 L NC. Pt. discharged to [name] for medical treatment. Pt. Received Tylenol 650 mg po for c/o headache a "8" on pain scale at 1701. Received Nicorette gum 4 mg po at 1701 per request. Belongings sent with patient and belonging safe slip.

## 2022-04-07 NOTE — Plan of Care (Addendum)
Occasionally, irritable on approach, easily frustrated emotional support provided. Visible in the open areas at intervals, no unsafe behavior was exhibited . VSS O2 sat WNL, continued on 02 via N/C 1.5/L Pt took med's as ordered, including a prn nicotine gum for cigarette craving with effects .Pt C/O heaviness of the chest VSS MD Limouse was notified an EKG chest X  were done and the EKG was read by MD Limouze and also the pt was assess by MD Limouze please see all the new orders .The psychiatrist made aware . New order obtained for lab draw done result pending. Pt received O.4 nitro sublingual Mylanta and lidocaine viscous as ordered with effects, pt verbalized relief on reassessment and received Aspirin as ordered tolerated well . Post nitro VSS as recorded, Pt ate 100% breakfast and lunch. Pt denies SI AH VH SI Pt declined group. The patient was offered the following alternatives to group attendance: individual contact with staff and journaling. Using the tablet to watch movies . Coping skills were encouraged. Will continue to assess pt and monitor for safety   Last BM 7/18. MD Limouze will follow up with labs result

## 2022-04-07 NOTE — H&P (Addendum)
Erroneous Done in error

## 2022-04-07 NOTE — Progress Notes (Signed)
Pt received sleeping in bed at the start of shift. 1 am pt woke up went to the bathroom, started  yelling & swearing at the staff conducting the safety checks to close the door all the way, each time the check is conducted she get upset. Staff explained to pt the sound would be less if the door is kept ajar a little. Pt came dashiing out of her room yelling at the top of her voice all the way to the nurses station. Nurse calmed her down and she agreed to keep the door ajar. No further behavior noted. Continue on 2 L 02 denies sob or resiratory distress. 02 sat 96%. Last BM reported on 04/05/22. No behavior or safety concerns. Will continue to monitor & maintain 15 minutes safety checks. Slept 7.5 hrs

## 2022-04-07 NOTE — Group Note (Signed)
OT Psych Group Documentation     Topics: Project      Occupations Addressed: Leisure, Social participation     Skills addressed: Fine/gross motor skills, process skills, social interaction skills, behavioral control, mental functions                   Date: 04/07/2022  Start Time: 1100  End Time: 1145    Attendance: Declined      Frances Ambrosino, OT/s  lic# No licensure found in state: [place]

## 2022-04-07 NOTE — H&P (Addendum)
I personally examined the patient along with the medical student. I reviewed data and exam findings for this patient and helped with formulation of assessment and plan. I agree with and have edited the medical student's note as above.    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    She has experienced two episodes of stabbing chest pain on exertion, worse with inspiration, that radiates to shoulder lasting about a half hour each during this admission. She was given nitroglycerin and the chest pain resolved rapidly. During these episodes, she endorses SOB, feeling sweaty, and a headache. She denies any lightheadedness or passing out during these episodes. She has never had similar episodes of this outside of the hospital and is very anxious.    She has a history of HFpEF and takes torsemide 80mg  BID, which she reports doesn't make her pee that much. She denies any leg swelling outpatient. She has COPD/OSA and is on 2L NC at home but has not been using it during this hospitalization.      Review of Systems:  10 systems reviewed and pertinent positives and negatives as per HPI.    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     (HFpEF) heart failure with preserved ejection fraction ([name])     MDD (major depressive disorder), recurrent, severe, with psychosis ([name])     Requires continuous at home supplemental oxygen     Spinal stenosis of lumbar 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 claudication     Fall in home     Gait instability     Hypokalemia     Lung nodule    Past Medical History:   Past Medical History:  schizo: Bipolar affective ([name])  No date: Chronic systolic CHF (congestive heart failure) ([name])  No date: Depression  12/23/2021: Fall in home  12/23/2021: Gait instability  12/23/2021: Requires continuous at home supplemental oxygen  depress: Schizo affective schizophrenia ([name])  12/23/2021: Spinal stenosis of lumbar 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   claudication  No date: Substance abuse ([name])    Past Surgical History:   No past surgical history on file.    Medications prior to Admission:   torsemide 40 MG TABS, Take 40 mg by mouth in the morning., Disp: 30 tablet, Rfl: 0  spironolactone (ALDACTONE) 50 MG tablet, Take 1 tablet by mouth in the  morning., Disp: 30 tablet, Rfl: 0  QUEtiapine (SEROQUEL) 50 MG tablet, Take 2 tablets by mouth at bedtime, Disp: 60 tablet, Rfl: 0  albuterol HFA 108 (90 Base) MCG/ACT inhaler, Inhale 2 puffs into the lungs every 6 (six) hours as needed for Wheezing, Disp: 1 each, Rfl: 0  atorvastatin (LIPITOR) 80 MG tablet, Take 1 tablet by mouth in the morning., Disp: 30 tablet, Rfl: 0  FLUoxetine (PROZAC) 20 MG capsule, Take 1 capsule by mouth in the morning., Disp: 30 capsule, Rfl: 0  gabapentin (NEURONTIN) 300 MG capsule, Take 2 capsules by mouth in the morning and 2 capsules at noon and 2 capsules before bedtime., Disp: 180 capsule, Rfl: 0  levETIRAcetam (KEPPRA) 750 MG tablet, Take 1 tablet by mouth in the morning and 1 tablet before bedtime., Disp: 60 tablet, Rfl: 0  loxapine (LOXITANE) 25 MG capsule,  Take 1 capsule by mouth in the morning and 1 capsule before bedtime., Disp: 60 capsule, Rfl: 0  OXcarbazepine (TRILEPTAL) 150 MG tablet, Take 1 tablet by mouth in the morning and 1 tablet before bedtime., Disp: 60 tablet, Rfl: 0  prazosin (MINIPRESS) 1 MG capsule, Take 3 capsules by mouth nightly, Disp: 90 capsule, Rfl: 0  budesonide-formoterol (SYMBICORT) 160-4.5 MCG/ACT inhaler, Inhale 2 puffs into the lungs in the morning and 2 puffs before bedtime., Disp: 1 each, Rfl: 0  levothyroxine (SYNTHROID) 25 MCG tablet, Take 1 tablet by mouth every morning before breakfast, Disp: 30 tablet, Rfl: 0  OTHER MEDICATION, Device: Inogen 3  Oxygen Flow Rate: 2-4 LPM  Titration Goal: SpO2 between 90% and 95%  Instructions:  1. Begin with 2 LPM and monitor oxygen saturation using a pulse oximeter.  2. If oxygen saturation is below 90%, increase the flow rate by 0.5 LPM every 15-30 minutes until the desired saturation level of 90%-95% is achieved.  3. If oxygen saturation is above 95%, decrease the flow rate by 0.5 LPM every 15-30 minutes until the desired saturation level of 90%-95% is achieved.  4. Once the desired saturation level is achieved, maintain the flow rate at that level.    DX: COPD with Hypoxia, Requires continuous supplemental oxygen  ICD10: Code: J44.9 and Z99.81    SIGNED Georgianne Fick, CNP, 01/05/2022    Nurse Practitioner   Hospitalist Division  [facility]  Units [place] [facility] and [place] [facility]  Phone:[phone] (fastest) or [phone]   Email:[email or web address]     NPI: [id number], Disp: 1 Device, Rfl: 0  OTHER MEDICATION, Wheelchair: Electric  For use as directed.    DX: Gait instability and falls due to lumbar stenosis of central canal     ICD10: R26.81, Disp: 1 Device, Rfl: 0      Allergies:   Review of Patient's Allergies indicates:   Bee venom               Anaphylaxis   Methylprednisolone      Dizziness, Drowsiness    Comment:Also believes syncope. Seems to tolerate             inhalational and epidural  steroid.   Nutritional supplem*       Hydroxyzine             Nausea Only    Social History:  Tobacco Use: Social History    Tobacco Use      Smoking status: Every Day        Packs/day: 3.00        Years: 30.00  Pack years: 90        Types: Cigarettes        Passive exposure: Current      Smokeless tobacco: Never    Alcohol:     Alcohol use Yes   Comment: 2 liters of captain morgan daily       Family History: No family history on file.      OBJECTIVE:   Intake/Output last 24hours (7a-7a):   No intake/output data recorded.    Vital Signs - Last 24 Hours:   BP: (102-130)/(61-87)   Temp:  [97 F (36.1 C)-97.5 F (36.4 C)]   Pulse:  [76-100]   Resp:  [16-20]   SpO2:  [88 %-100 %]     Vital Signs - Last 8 Hours:   BP: (102-126)/(61-87)   Temp:  [97 F (36.1 C)]   Pulse:  [80-100]   Resp:  [16-20]   SpO2:  [90 %-100 %]     Physical Exam:  General: Interactive, sitting up in bed, in no acute distress, answers questions appropriately, cooperative with exam.  Lungs: Non-labored respirations. CTAB, no W/R/C. B/L equal chest expansion.  Heart: RRR, +S1/S2, no M/R/G. No tachycardia.   Pulses: 2+ DP pulses, symmetric bilaterally.  Abdomen: +BS, soft, NTND, no guarding or rebound, no organomegaly or masses.  Extremities: 1+ BLE edema to midshin. Warm, moving four extremities independently.  Skin: numerous well healed scars on both arms  Neuro: AAO x 4. CN II-XII grossly intact. Gait not tested.    Recent Labs:  Recent Labs     04/07/22  1208   NA 133*   K 3.1*   CL 83*   CO2 36*   BUN 20*   CREAT 0.9   GFR > 60   ANION 14   GLUCOSER 130   WBC 7.4   HGB 14.3   HCT 42.4   PLTA 387   ALBUMIN 4.5   AST 22   ALT 27   ALKPHOS 212*   TBILI 0.4   DBILI < 0.2   CA 9.9     TROPONIN T HS RANDOM (ng/L)   Date Value   01/14/2022 27 (H)     TROPONIN T HS BASELINE (ng/L)   Date Value   04/07/2022 20 (H)     TROPONIN T HS 3 HOUR RESULT (ng/L)   Date Value   04/07/2022 22 (H)     DELTA 1 HOUR TROPONIN T HS (ng/L)   Date Value    01/11/2022 0     DELTA 3 HOUR TROPONIN T HS (ng/L)   Date Value   04/07/2022 2     D-dimer: 622    NT-proBNP (pg/mL)   Date Value   03/31/2022 39   03/30/2022 53   03/23/2022 466 (H)     No results found for: BNP      Imaging:   XR Chest Portable  Narrative: TECHNIQUE: Portable chest, April 07, 2022. 11:19 a.m. Two images    Indication: chest and left sided abdominal pain     Comparison: July 14    FINDINGS:    Quality: Satisfactory.       Tubes/lines: None.    Lungs: The lungs are clear.     Pleura: There is no pleural effusion or pneumothorax.    Heart: The cardiac silhouette is unremarkable.     Mediastinum/hila: Unremarkable.    Bones and Soft Tissues: Unremarkable      Impression: No acute cardiopulmonary findings on portable chest radiograph.  Reviewed and Electronically Signed By: Galen Daft, MD  Signed Date and Time: 04/07/2022 12:52 PM        ASSESSMENT / PLAN:   Cheryl Zimmerman is a 51yo with a history of MDD, anxiety, COPD, GERD, and HFpEF (70%) admited to medicine with chest pain and an elevated d-dimer.     #Chest pain   #Stable angina  #HFpEF  #Elevated d-dimer   Patient reporting 2 episodes of pleuritic, stabbing chest pain that radiates to her shoulder and resolve with nitroglycerin. Troponin 20 and 22 during chest pain episode. Differential includes stable angina (most likely given exertional chest pain, radiation to shoulder, resolves with nitroglycerin), PE (elevated D-dimer and pleuritic, less likely because no change in O2 sats and resolved spontaneously, no tachycardia), HF exacerbation (euvolemic on exam), gastritis (prior hx, less likely given quality/location of the pain). Will hold CTPA for now but low threshold if chest pain returns with tachycardia or increasing oxygen requirements.   - f/u EKG, nt-Pro-BNP, troponin  - inpatient stress test tmw    - nitroglycerin prn for chest pain     #COPD  Patient has long smoking history and is on 2L NC outpatient. She uses albuterol prn but no  other control medications. Lungs clear with no crackles or wheezing, does not appear to be in an acute COPD exacerbation w/o higher oxygen needs than baseline and would not benefit from steroids at this time.   - Spiriva 2 puffs daily  - Albuterol prn    #MDD  #Anxiety  #PTSD  #Polysubstance use   Extensive psychiatry history, being transferred to the floor from psych. Will likely return to floor after medically cleared.   - Continue psych meds: see psych note   - f/u psych consult    #Hypokalemia  3.1 on admission to the floor.    - PO BID for 2 days    #Shoulder pain  Dislocated shoulder after seizure last year and has been hurting significantly more lately. On   -PT consult     Chronic:  #Hypothyroidism: levothyroxine   #GERD: GI cocktail + pantoprozole 40mg    #Chronic pain: gabapentin, baclofen  #Seizures: keppra 750 BID   #Polysubstance use: continue suboxone 8mg  TID    #Anxiety: zyprexa + trazodone at night   #Nicotine use: continue nicorette gum q2h + patch       Code status: Full Code  PPx: holding for now, encourage OOB and ambulation as tolerated + SCDs   FEN/GI:    PCP: , MD  HCP: Data Unavailable Data Unavailable  Med Rec: Completed at admission  Dispo:     Discussed with attending physician , MD    [name], [id number]  Fam Med Sub-I   04/07/22 6:01 PM

## 2022-04-07 NOTE — Group Note (Signed)
OT Psych Group Documentation    Topics: Breakfast Bites      Occupations Addressed: Activities of Daily living, Social participation, Instrumental activities of daily living (health management & maintenance)     Skills addressed: Fine/gross motor skills, process skills, social interaction skills, behavioral control, mental functions (cognitive, perceptual, affective)                  Date: 04/07/2022  Start Time: 0800  End Time: 0845    Attendance: Declined      Idolina Mantell, OT/s  lic# No licensure found in state: [place]

## 2022-04-08 ENCOUNTER — Encounter (HOSPITAL_BASED_OUTPATIENT_CLINIC_OR_DEPARTMENT_OTHER): Payer: Self-pay

## 2022-04-08 ENCOUNTER — Observation Stay (HOSPITAL_BASED_OUTPATIENT_CLINIC_OR_DEPARTMENT_OTHER): Payer: No Typology Code available for payment source

## 2022-04-08 DIAGNOSIS — F1120 Opioid dependence, uncomplicated: Secondary | ICD-10-CM | POA: Diagnosis not present

## 2022-04-08 DIAGNOSIS — I5032 Chronic diastolic (congestive) heart failure: Secondary | ICD-10-CM | POA: Diagnosis not present

## 2022-04-08 DIAGNOSIS — R0902 Hypoxemia: Secondary | ICD-10-CM | POA: Diagnosis not present

## 2022-04-08 DIAGNOSIS — I2510 Atherosclerotic heart disease of native coronary artery without angina pectoris: Secondary | ICD-10-CM

## 2022-04-08 DIAGNOSIS — Z9981 Dependence on supplemental oxygen: Secondary | ICD-10-CM | POA: Diagnosis not present

## 2022-04-08 DIAGNOSIS — I208 Other forms of angina pectoris: Secondary | ICD-10-CM | POA: Diagnosis not present

## 2022-04-08 DIAGNOSIS — F333 Major depressive disorder, recurrent, severe with psychotic symptoms: Secondary | ICD-10-CM | POA: Diagnosis not present

## 2022-04-08 DIAGNOSIS — R9431 Abnormal electrocardiogram [ECG] [EKG]: Secondary | ICD-10-CM | POA: Diagnosis not present

## 2022-04-08 DIAGNOSIS — R0609 Other forms of dyspnea: Secondary | ICD-10-CM | POA: Diagnosis not present

## 2022-04-08 DIAGNOSIS — J449 Chronic obstructive pulmonary disease, unspecified: Secondary | ICD-10-CM | POA: Diagnosis not present

## 2022-04-08 DIAGNOSIS — F331 Major depressive disorder, recurrent, moderate: Secondary | ICD-10-CM | POA: Diagnosis not present

## 2022-04-08 DIAGNOSIS — F251 Schizoaffective disorder, depressive type: Secondary | ICD-10-CM | POA: Diagnosis not present

## 2022-04-08 DIAGNOSIS — R45851 Suicidal ideations: Secondary | ICD-10-CM | POA: Diagnosis not present

## 2022-04-08 DIAGNOSIS — R0789 Other chest pain: Secondary | ICD-10-CM | POA: Diagnosis present

## 2022-04-08 LAB — BASIC METABOLIC PANEL
ANION GAP: 11 mmol/L (ref 10–22)
BUN (UREA NITROGEN): 24 mg/dL — ABNORMAL HIGH (ref 7–18)
CALCIUM: 9.6 mg/dL (ref 8.5–10.5)
CARBON DIOXIDE: 40 mmol/L — ABNORMAL HIGH (ref 21–32)
CHLORIDE: 83 mmol/L — ABNORMAL LOW (ref 98–107)
CREATININE: 0.8 mg/dL (ref 0.4–1.2)
ESTIMATED GLOMERULAR FILT RATE: >60 mL/min (ref >60)
Glucose Random: 117 mg/dL (ref 74–160)
POTASSIUM: 2.8 mmol/L — ABNORMAL LOW (ref 3.5–5.1)
SODIUM: 134 mmol/L — ABNORMAL LOW (ref 136–145)

## 2022-04-08 LAB — LIMITED ECHOCARDIOGRAM W/ ULTRASOUND CONTRAST: LVEF: 65 %

## 2022-04-08 MED ORDER — ACETAMINOPHEN 500 MG PO TABS
1000.0000 mg | ORAL_TABLET | Freq: Three times a day (TID) | ORAL | Status: DC
Start: 2022-04-08 — End: 2022-04-09
  Administered 2022-04-08 – 2022-04-09 (×2): 1000 mg via ORAL
  Filled 2022-04-08 (×2): qty 2

## 2022-04-08 MED ORDER — LORAZEPAM 0.5 MG PO TABS
0.5000 mg | ORAL_TABLET | Freq: Three times a day (TID) | ORAL | Status: DC | PRN
Start: 2022-04-08 — End: 2022-04-09
  Administered 2022-04-08 – 2022-04-09 (×3): 0.5 mg via ORAL
  Filled 2022-04-08 (×3): qty 1

## 2022-04-08 MED ORDER — PERFLUTREN PROTEIN A MICROSPH IV SUSP
INTRAVENOUS | Status: AC
Start: 2022-04-08 — End: 2022-04-08
  Administered 2022-04-08: 7 mL via INTRAVENOUS
  Filled 2022-04-08: qty 3

## 2022-04-08 MED ORDER — LIDOCAINE VISCOUS HCL 2 % MT SOLN
10.00 mL | Freq: Once | OROMUCOSAL | Status: AC
Start: 2022-04-08 — End: 2022-04-08
  Administered 2022-04-08: 10 mL via ORAL
  Filled 2022-04-08: qty 15

## 2022-04-08 MED ORDER — NICOTINE POLACRILEX 2 MG MT GUM
4.0000 mg | CHEWING_GUM | OROMUCOSAL | Status: DC | PRN
Start: 2022-04-08 — End: 2022-04-09
  Administered 2022-04-08 – 2022-04-09 (×3): 4 mg via ORAL
  Filled 2022-04-08 (×5): qty 2

## 2022-04-08 MED ORDER — PERFLUTREN DILUTED IV BOLUS
1.0000 mL | Status: DC
Start: 2022-04-08 — End: 2022-04-08

## 2022-04-08 MED ORDER — NICOTINE POLACRILEX 2 MG MT GUM
2.0000 mg | CHEWING_GUM | OROMUCOSAL | Status: DC | PRN
Start: 2022-04-08 — End: 2022-04-08
  Filled 2022-04-08: qty 1

## 2022-04-08 MED ORDER — ACETAMINOPHEN 500 MG PO TABS
1000.0000 mg | ORAL_TABLET | Freq: Three times a day (TID) | ORAL | Status: DC | PRN
Start: 2022-04-08 — End: 2022-04-08
  Administered 2022-04-08: 1000 mg via ORAL
  Filled 2022-04-08: qty 2

## 2022-04-08 MED ORDER — ALUMINUM & MAGNESIUM HYDROXIDE 200-200 MG/5ML PO SUSP
30.00 mL | Freq: Once | ORAL | Status: AC
Start: 2022-04-08 — End: 2022-04-08
  Administered 2022-04-08: 30 mL via ORAL
  Filled 2022-04-08: qty 30

## 2022-04-08 MED ORDER — NITROGLYCERIN 0.4 MG SL SUBL
0.4000 mg | SUBLINGUAL_TABLET | SUBLINGUAL | Status: DC | PRN
Start: 2022-04-08 — End: 2022-04-08
  Administered 2022-04-08 (×2): 0.4 mg via SUBLINGUAL
  Filled 2022-04-08: qty 1

## 2022-04-08 NOTE — Plan of Care (Signed)
Patient is alert and oriented x 3.  Denies chest pain/ palpitation. Denies dizziness. Remain on 2L oxygen, denies SOB. On purewick, voiding well. Pt slept well through the night. No distress.

## 2022-04-08 NOTE — Discharge Summary (Signed)
Physician Discharge Summary     Patient ID:  Cheryl Zimmerman  [id number]  51 year old  1971-07-09    Admit date: 04/07/22      Discharge date and time: 04/08/2022 5:05 PM    Admitting Physician: Alphia Kava, MD     Discharge Physician: Alphia Kava, MD    Admission Diagnoses: Angina pectoris syndrome ([name]) [I20.9]  Chest pain, atypical [R07.89]    Discharge Diagnoses: Active Hospital Problems    Chest pain, atypical      *Stable angina ([name])        Admission Condition: fair    Discharged Condition: good    PCP Follow-up/Outstanding Issues  [ ]  COPD - started on spiriva inpatient, f/u if helps patient   [ ]  Stable angina   [ ]  Shoulder pain     Indication for Admission: chest pain    Hospital Course:   Overall Summary  Cheryl Zimmerman is a 51yo female with a history of PTSD, MDD, COPD (on 2L at home), GERD, and HFpEF (70%, on home torsemide) who was admitted with exertional chest pain significant for stable angina. She was medically cleared and transferred back to the psych floor.    Hospital Course By Problem  #Chest Pain  #Stable angina  #HFpEF  Patient admitted for episodes of stabbing chest pain that radiates to her shoulder with exertion and resolves with nitroglycerin. Troponins and EKG were reassuring against ACS. Previous nuclear echo in February 2023 was normal. Limited echo today showed no acute changes from previous ECHO. Should continue statin and aspirin and nitroglycerin as needed for chest pain.     #COPD  Patient has long smoking history and is on 2L NC outpatient. She uses albuterol prn but no other control medications. Lungs clear with no crackles or wheezing and did not require steroids. Started spiriva inpatient with plan to continue at home.     #Hypokalemia, improving  Potassium low likely secondary to metolazone. Has had this adverse reaction in the past. Corrected with PO potassium; would avoid metolazone in the future.     #Shoulder pain  Pt to be seen by OT prior to discharge, can  continue with them outpatient if needs.    Consults:  IP CONSULT TO PSYCHIATRY  IP CONSULT TO PHYSICAL THERAPY (EVAL AND TREAT)  IP CONSULT TO OCCUPATIONAL THERAPY (EVAL AND TREAT)    Significant Diagnostic Studies:     Recent Labs     04/07/22  1208 04/08/22  0705   NA 133* 134*   K 3.1* 2.8*   CL 83* 83*   CO2 36* 40*   BUN 20* 24*   CREAT 0.9 0.8   GFR > 60 > 60   ANION 14 11   GLUCOSER 130 117   WBC 7.4  --    HGB 14.3  --    HCT 42.4  --    PLTA 387  --    ALBUMIN 4.5  --    AST 22  --    ALT 27  --    ALKPHOS 212*  --    TBILI 0.4  --    DBILI < 0.2  --    CA 9.9 9.6     TROPONIN T HS RANDOM (ng/L)   Date Value   04/07/2022 24 (H)     TROPONIN T HS 3 HOUR RESULT (ng/L)   Date Value   04/07/2022 22 (H)     DELTA 1 HOUR TROPONIN T HS (ng/L)  Date Value   01/11/2022 0     DELTA 3 HOUR TROPONIN T HS (ng/L)   Date Value   04/07/2022 2         NT-proBNP (pg/mL)   Date Value   04/07/2022 < 36   03/31/2022 39   03/30/2022 53       Micro:  No results for input(s): BLCA, BLCAN in the last 72 hours.  No results found for: Blane Ohara    Imaging:   Limited Echocardiogram w/ Ultrasound Contrast    Result Date: 04/08/2022  Conclusions: (click link below for full report) 1. Left Ventricle: Global systolic function: EF is estimated visually at 65% . 2. 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 The remaining left ventricular segments contract normally. 3. Right Ventricle: Moderately dilated. Systolic function is normal. 4. Tricuspid Valve: There is insufficient regurgitation to estimate RVSP. 5. 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 6. As compared to the previous echocardiogram on 12/07/2021, the ascending aorta was found dilated at 3.7 cm from  normal. Otherwise, no obvious changes.    XR Chest Portable    Result Date: 04/07/2022  TECHNIQUE: Portable chest, April 07, 2022. 11:19 a.m. Two images Indication: chest and left sided abdominal pain Comparison: July 14 FINDINGS: Quality: Satisfactory.   Tubes/lines: None. Lungs: The lungs are clear. Pleura: There is no pleural effusion or pneumothorax. Heart: The cardiac silhouette is unremarkable. Mediastinum/hila: Unremarkable. Bones and Soft Tissues: Unremarkable       No acute cardiopulmonary findings on portable chest radiograph. Reviewed and Electronically Signed By: Galen Daft, MD Signed Date and Time: 04/07/2022 12:52 PM       EKG: NSR, inferior infarct unknown age     Treatments: As per hospital course above.    Discharge Exam:  Temp:  [98 F (36.7 C)-98.4 F (36.9 C)] 98 F (36.7 C)  Pulse:  [73-84] 73  Resp:  [16-20] 19  BP: (102-141)/(67-85) 111/76  General: Interactive, sitting up in bed, in no acute distress, answers questions appropriately, cooperative with exam.  Lungs: Non-labored respirations. CTAB, no W/R/C. B/L equal chest expansion.  Heart: RRR, +S1/S2, no M/R/G. No tachycardia.   Pulses: 2+ DP pulses, symmetric bilaterally.  Abdomen: +BS, soft, NTND, no guarding or rebound, no organomegaly or masses.  Extremities: 1+ BLE edema to midshin. Warm, moving four extremities independently.  Skin: numerous well healed scars on both arms  Neuro: AAO x 4. CN II-XII grossly intact. Gait not tested.    Disposition: return to psych unit     Patient Instructions:   Current Discharge Medication List    CONTINUE these medications which have NOT CHANGED    torsemide 40 MG TABS  Take 40 mg by mouth in the morning.  Qty: 30 tablet Refills: 0    spironolactone (ALDACTONE) 50 MG tablet  Take 1 tablet by mouth in the morning.  Qty: 30 tablet Refills: 0    QUEtiapine (SEROQUEL) 50 MG tablet  Take 2 tablets by mouth at bedtime  Qty: 60 tablet Refills: 0    albuterol HFA 108 (90 Base) MCG/ACT inhaler  Inhale 2  puffs into the lungs every 6 (six) hours as needed for Wheezing  Qty: 1 each Refills: 0    atorvastatin (LIPITOR) 80  MG tablet  Take 1 tablet by mouth in the morning.  Qty: 30 tablet Refills: 0    FLUoxetine (PROZAC) 20 MG capsule  Take 1 capsule by mouth in the morning.  Qty: 30 capsule Refills: 0    gabapentin (NEURONTIN) 300 MG capsule  Take 2 capsules by mouth in the morning and 2 capsules at noon and 2 capsules before bedtime.  Qty: 180 capsule Refills: 0    levETIRAcetam (KEPPRA) 750 MG tablet  Take 1 tablet by mouth in the morning and 1 tablet before bedtime.  Qty: 60 tablet Refills: 0    loxapine (LOXITANE) 25 MG capsule  Take 1 capsule by mouth in the morning and 1 capsule before bedtime.  Qty: 60 capsule Refills: 0    OXcarbazepine (TRILEPTAL) 150 MG tablet  Take 1 tablet by mouth in the morning and 1 tablet before bedtime.  Qty: 60 tablet Refills: 0    prazosin (MINIPRESS) 1 MG capsule  Take 3 capsules by mouth nightly  Qty: 90 capsule Refills: 0    budesonide-formoterol (SYMBICORT) 160-4.5 MCG/ACT inhaler  Inhale 2 puffs into the lungs in the morning and 2 puffs before bedtime.  Qty: 1 each Refills: 0    levothyroxine (SYNTHROID) 25 MCG tablet  Take 1 tablet by mouth every morning before breakfast  Qty: 30 tablet Refills: 0    !! OTHER MEDICATION  Device: Inogen 3  Oxygen Flow Rate: 2-4 LPM  Titration Goal: SpO2 between 90% and 95%  Instructions:  1. Begin with 2 LPM and monitor oxygen saturation using a pulse oximeter.  2. If oxygen saturation is below 90%, increase the flow rate by 0.5 LPM every 15-30 minutes until the desired saturation level of 90%-95% is achieved.  3. If oxygen saturation is above 95%, decrease the flow rate by 0.5 LPM every 15-30 minutes until the desired saturation level of 90%-95% is achieved.  4. Once the desired saturation level is achieved, maintain the flow rate at that level.    DX: COPD with Hypoxia, Requires continuous supplemental oxygen  ICD10: Code: J44.9 and  Z99.81    SIGNED Georgianne Fick, CNP, 01/05/2022    Nurse Practitioner   Hospitalist Division  [facility]  [address]  Phone:[phone] (fastest) or [phone]   Email:[email or web address]     NPI: [id number]  Qty: 1 Device Refills: 0    !! OTHER MEDICATION  Wheelchair: Electric  For use as directed.    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 - Potential duplicate medications found. Please discuss with provider.        Activity: activity as tolerated  Diet: regular diet  Wound Care: none needed    Code Status during hospital stay: Full Code    [place] Future Appointments:  Current and Future Appointments at [facility] [phone] Days) 04/08/2022 - 07/07/2022    None          Follow-up:  Follow-up Information    None         Signed:  Francena Hanly  04/08/2022  5:05 PM       I personally examined the patient along with the medical student. I agree with and have edited the discharge summary as above.    Neliah Cuyler P Jeimy Bickert, MD, 04/08/2022  PGY-3

## 2022-04-08 NOTE — Plan of Care (Addendum)
Problem: Safety  Goal: Free from accidental physical injury  Outcome: Progressing     Problem: Daily Care  Goal: Daily care needs are met  Description: Assess and monitor ability to perform self care and identify potential discharge needs.  Outcome: Progressing     Problem: Pain  Goal: Patient's pain/discomfort is manageable  Description: Assess and monitor patient's pain using appropriate pain scale. Collaborate with interdisciplinary team and initiate plan and interventions as ordered. Re-assess patient's pain level 30 - 60 minutes after pain management intervention.   Outcome: Progressing     Problem: Compromised Skin Integrity  Description: Use this problem when pressure ulcers are classified as stage I or II.  Goal: Skin integrity is maintained or improved  Description: Assess and monitor skin integrity. Identify patients at risk for skin breakdown on admission and per policy. Collaborate with interdisciplinary team and initiate plans and interventions as needed.  Outcome: Progressing     Problem: Knowledge Deficit  Goal: Patient/S.O. demonstrates understanding of disease process, treatment plan, medications, and discharge instructions.  Description: Complete learning assessment and assess knowledge base  Outcome: Progressing    VSS, pt alert and upset in AM and asking for nicotine gum. Md made aware, new order for nicotine gum given. Pt refused tele and reports she does not want to have a stress test done and that she just wants to go back to L2. Pt pacing in hall with walker and reports she just wants to leave. Pt asking for ativan and states that she is  ordered for it upstairs. New order for ativan given with good effect.     1338: Pt reporting chest pressure, lightheadedness and headache. Pt states these are the same symptoms as yesterday. EKG completed. MD at bedside. Nitro given x2 with so relief. Pt states the medication she received  yesterday that numbed her throat and chest worked the best. Md made  aware, new order for Maalox and lidocaine given with good effect.
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
## 2022-04-08 NOTE — Progress Notes (Signed)
04/08/22 0758   Obervations/Findings   Rehab Service Who Did Screen PT   Reviewed Patient Medical Record Yes   Clinical Staff Indicates Pt. Issue Yes  (shoulder pain after dislocating it when pt seized)   Family/Caregiver/Friend Indicates Pt. Issue No   Patient Observed Experiencing Issues No  (No functional mobility issues that are impacting her ability to ambulate)   Recommendations   Recommendations OT indicated, need order   Comments Refer to note below     PT consult received for "shoulder pain." Pt is familiar to rehab staff as she is has been on psych for quite some time and ambulates independently with her rollator. Currently, biggest complaint is shoulder pain which can be addressed with occupational therapy.  Biggest focus for Physical therapy in hospital is to address mobility deficits related to trunk/lower body and to assess functional mobility during bed mobility, transfers, ambulation, and stair climbing. Recommending OT consult for shoulder pain at this time. Clearing PT consult.     Lyla Glassing, [place], [place] # [id number]

## 2022-04-08 NOTE — Discharge Inst - Reason you came to the hospital (Addendum)
Reason you came to the hospital: chest pain    Discharge Diagnosis: stable angina    Description of Hospital Course: You were brought to the medicine floor because you had pain in your chest when you walked around, and it got better with a medication called nitroglycerin. You had an ultrasound of your heart that showed no changes from your previous imaging. You also got some medicine to help with your electrolyte levels in your blood. You had no more chest pain and you returned to the psych floor the next day.     Important Results: The imaging of your heart was unchanged from previous.    Reasons to call your PCP: chest pain that occurs at rest, or that does not respond to nitroglycerin
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
## 2022-04-08 NOTE — Progress Notes (Addendum)
Resident Daily Progress Note    Cheryl Zimmerman is a 51yo with a history of MDD, anxiety, COPD, GERD, and HFpEF (70%) admited to medicine with chest pain and an elevated d-dimer.     SUBJECTIVE:   This AM:  - Feels okay, requesting more nicorette gum. No ongoing chest pain, SOB, or diaphoresis.  - Cards PA recommend doing limited echo instead of nuclear echo because she had one in feb that was normal     Interval History:  - trops stable (20 -> 22 -> 24)  - ntPro-BNP <36  - EKG: inferior infarct unknown age, not noted on previous EKGs     ROS otherwise negative except as noted above.    Scheduled Meds:   . tiotropium  2 puff Inhalation Daily   . aspirin  324 mg Oral Daily   . baclofen  10 mg Oral BID   . buprenorphine-naloxone  8 mg Sublingual TID   . fluticasone  1 spray Each Nostril Daily   . gabapentin  600 mg Oral TID   . levETIRAcetam  750 mg Oral BID   . levothyroxine  25 mcg Oral DAILY   . methocarbamol  500 mg Oral TID   . metOLazone  2.5 mg Oral Daily   . nicotine  1 patch Transdermal Daily   . nystatin   Topical TID   . OXcarbazepine  300 mg Oral TID   . pantoprazole  40 mg Oral Daily   . polyethylene glycol  17 g Oral Daily   . potassium chloride SA  40 mEq Oral BID   . prazosin  1 mg Oral Nightly   . sennosides  17.2 mg Oral Nightly   . spironolactone  50 mg Oral Daily   . torsemide  80 mg Oral BID   . heparin (porcine)  5,000 Units Subcutaneous Q12H SCH     PRN Meds: nitroglycerin    OBJECTIVE:   Vital signs in last 24 hours:  Temp:  [97 F (36.1 C)-98.4 F (36.9 C)] 98.4 F (36.9 C)  Pulse:  [75-100] 80  Resp:  [16-20] 20  BP: (102-130)/(61-87) 113/75  SpO2: 92 % (04/08/2022  5:48 AM)       Intake/Output Summary (Last 24 hours) at 04/08/2022 0644  Last data filed at 04/08/2022 0047  Gross per 24 hour   Intake --   Output 1700 ml   Net -1700 ml       Exam:  BP 113/75   Pulse 80   Resp 20   Wt (!) 152.4 kg (336 lb)   SpO2 92%   BMI 49.62 kg/m   General: Interactive, sitting up in bed, in no acute  distress, answers questions appropriately, cooperative with exam.  Lungs: Non-labored respirations. CTAB, no W/R/C. B/L equal chest expansion.  Heart: RRR, +S1/S2, no M/R/G. No tachycardia.   Pulses: 2+ DP pulses, symmetric bilaterally.  Abdomen: +BS, soft, NTND, no guarding or rebound, no organomegaly or masses.  Extremities: 1+ BLE edema to midshin. Warm, moving four extremities independently.  Skin: numerous well healed scars on both arms  Neuro: AAO x 4. CN II-XII grossly intact. Gait not tested.    DATA:  Recent Labs     04/07/22  1208   NA 133*   K 3.1*   CL 83*   CO2 36*   BUN 20*   CREAT 0.9   GFR > 60   ANION 14   GLUCOSER 130   WBC 7.4   HGB 14.3  HCT 42.4   PLTA 387   ALBUMIN 4.5   AST 22   ALT 27   ALKPHOS 212*   TBILI 0.4   DBILI < 0.2   CA 9.9     TROPONIN T HS RANDOM (ng/L)   Date Value   04/07/2022 24 (H)     TROPONIN T HS 3 HOUR RESULT (ng/L)   Date Value   04/07/2022 22 (H)     DELTA 1 HOUR TROPONIN T HS (ng/L)   Date Value   01/11/2022 0     DELTA 3 HOUR TROPONIN T HS (ng/L)   Date Value   04/07/2022 2         NT-proBNP (pg/mL)   Date Value   04/07/2022 < 36   03/31/2022 39   03/30/2022 53     No results found for: BNP    Micro:    Imaging:   XR Chest Portable  Narrative: TECHNIQUE: Portable chest, April 07, 2022. 11:19 a.m. Two images    Indication: chest and left sided abdominal pain     Comparison: July 14    FINDINGS:    Quality: Satisfactory.       Tubes/lines: None.    Lungs: The lungs are clear.     Pleura: There is no pleural effusion or pneumothorax.    Heart: The cardiac silhouette is unremarkable.     Mediastinum/hila: Unremarkable.    Bones and Soft Tissues: Unremarkable      Impression: No acute cardiopulmonary findings on portable chest radiograph.    Reviewed and Electronically Signed By: Galen Daft, MD  Signed Date and Time: 04/07/2022 12:52 PM      ASSESSMENT / PLAN:   Cheryl Zimmerman is a 51yo with a history of MDD, anxiety, COPD, GERD, and HFpEF (70%) admited to medicine with  chest pain and an elevated d-dimer.     #Chest pain   #Stable angina  #HFpEF  #Elevated d-dimer   Patient reporting 2 episodes of pleuritic, exertional, stabbing chest pain that radiates to her shoulder and resolve with nitroglycerin. Troponin 20 and 22 during chest pain episode. Differential includes stable angina (most likely given exertional chest pain, radiation to shoulder, resolves with nitroglycerin), PE (elevated D-dimer and pleuritic, less likely because no change in O2 sats and resolved spontaneously, no tachycardia), HF exacerbation (euvolemic on exam), gastritis (prior hx, less likely given quality/location of the pain). Will hold CTPA for now but low threshold if chest pain returns with tachycardia or increasing oxygen requirements. No ongoing chest pain, will get limited echo today given recent nl nuclear echo.   - f/u limited echo today  - nitroglycerin prn for chest pain     #COPD  Patient has long smoking history and is on 2L NC outpatient. She uses albuterol prn but no other control medications. Lungs clear with no crackles or wheezing, does not appear to be in an acute COPD exacerbation w/o higher oxygen needs than baseline and would not benefit from steroids at this time.   - Spiriva 2 puffs daily  - Albuterol prn    #MDD  #Anxiety  #PTSD  #Polysubstance use   Extensive psychiatry history, being transferred to the floor from psych. Will likely return to floor after medically cleared.   - Continue psych meds: see psych note   - f/u psych consult    #Hypokalemia  3.1 on admission to the floor.    - PO BID for 2 days    #Shoulder pain  Dislocated shoulder after seizure last year and has been hurting significantly more lately. Appreciate PT rec to receive occupational therapy.   -OT consult     Chronic:  #Hypothyroidism: levothyroxine   #GERD: GI cocktail + pantoprozole 40mg    #Chronic pain: gabapentin, baclofen  #Seizures: keppra 750 BID   #Polysubstance use: continue suboxone 8mg   TID    #Anxiety: zyprexa + trazodone at night   #Nicotine use: continue nicorette gum q2h + patch     Code status: Full Code  PPx: ***  FEN/GI: Orders Placed This Encounter      Diet - Base Diet: Modified/Restricted; Cardiac Restrictions: Stress Test (NO CAFFEINE/NO DECAF)    PCP: , MD  HCP: Data Unavailable Data Unavailable  Med Rec: Completed at admission  Dispo:     Discussed with attending physician , MD    [name], TUSM4  [facility]am [facility]  Pager: [phone]    04/08/22 6:44 AM

## 2022-04-08 NOTE — Progress Notes (Signed)
OT Screen     04/08/22 1300   Obervations/Findings   Rehab Service Who Did Screen OT   Reviewed Patient Medical Record Yes   Clinical Staff Indicates Pt. Issue No   Family/Caregiver/Friend Indicates Pt. Issue No   Patient Observed Experiencing Issues No   Recommendations   Comments Refer to note attached     Extensive chart review conducted. Met with pt, who reports her shoulder pain is related to a fall from ~7 months ago, and reports no change. Pt reports ambulating independently with rollator, and independence with ADLs/IADLs.    In the past year, pt has received HEP for left shoulder AAROM from OT and encouraged stretching in the past to alleviate, however pt has not been compliant with this.     OT clearing consult at this time, as OT not indicated 2/2 nature of previous injury, pt's lack of interest in novel education, and pt's independence in ADLs/IADLs. May recommend future outpatient OT if patient is willing and receptive.     Gayland Curry, [place], Lic # [id number]

## 2022-04-08 NOTE — Case Mgmt Note (Signed)
04/07/22 UR discharge note: Patient was transferred to medical today from [facility] due to her elevated labs. She is being evaluated further for her elevated D-Dimer = 622, Troponin = 20,Troponin T HS 3 Hour Result - 22, and Troponin T HS Random = 24 and Alkaline Phosphatase = 212. The plan will be for her to return to [facility] once stable. Her treatment team has been working with [facility] for placement as she is homeless. They have suggested SNF placement as a possibility. Adult [facility] was also considered but due to her history, this is not going to be an option for her. Patient transferred before she could sign the [facility] I M form. Her [facility] remained in effect throughout her admission on [facility] from 03/24/22 through discharge to medical on 04/07/22. UR will follow.

## 2022-04-08 NOTE — Progress Notes (Signed)
Chart reviewed.  Case discussed in multidisciplinary rounds.  Meeting with patient at the bedside.  Interview is limited because patient kicked me out of the room.  Patient admitted from inpatient Psych.  Patient tells me that she was homeless prior to Psych admission (03/24/22-04/07/22).  She was living on the [address].  She declines to tell me which city she was staying in.  She declines to tell me if she stays in shelters.  Patient describes independence with all ADL/IADL's and ambulation with a rollator.  She denies falls.  Patient states that she has never had VNA or been to short term rehabilitation.  Patient has home O2 (condenser) that she uses PRN.  Patient was walking around the unit today without her home O2.  Patient states that the only plan that she will agree to is return to inpatient Psych.  Awaiting Psych CL eval.  Plan: CM to follow.  Anticipate dc back to inpatient Psych.  Patient denies any further questions and/or concerns re: plan of discharge at this time.

## 2022-04-08 NOTE — Discharge Summary (Deleted)
The provider documented this note on the incorrect encounter. This information has been reviewed and the note has been moved to the correct visit.

## 2022-04-09 ENCOUNTER — Encounter (HOSPITAL_BASED_OUTPATIENT_CLINIC_OR_DEPARTMENT_OTHER): Payer: Self-pay | Admitting: Psychiatry

## 2022-04-09 ENCOUNTER — Inpatient Hospital Stay
Admission: AD | Admit: 2022-04-09 | Discharge: 2022-04-12 | DRG: 885 | Disposition: A | Discharge status: Other Acute Inpatient Hospital | Payer: No Typology Code available for payment source | Source: Intra-hospital | Attending: Psychiatry | Admitting: Psychiatry

## 2022-04-09 DIAGNOSIS — F1990 Other psychoactive substance use, unspecified, uncomplicated: Secondary | ICD-10-CM

## 2022-04-09 DIAGNOSIS — I5032 Chronic diastolic (congestive) heart failure: Secondary | ICD-10-CM | POA: Diagnosis not present

## 2022-04-09 DIAGNOSIS — F1490 Cocaine use, unspecified, uncomplicated: Secondary | ICD-10-CM | POA: Diagnosis present

## 2022-04-09 DIAGNOSIS — F1090 Alcohol use, unspecified, uncomplicated: Secondary | ICD-10-CM | POA: Diagnosis present

## 2022-04-09 DIAGNOSIS — F603 Borderline personality disorder: Secondary | ICD-10-CM | POA: Diagnosis not present

## 2022-04-09 DIAGNOSIS — M25519 Pain in unspecified shoulder: Secondary | ICD-10-CM

## 2022-04-09 DIAGNOSIS — F331 Major depressive disorder, recurrent, moderate: Principal | ICD-10-CM | POA: Diagnosis present

## 2022-04-09 DIAGNOSIS — R079 Chest pain, unspecified: Secondary | ICD-10-CM

## 2022-04-09 DIAGNOSIS — G8929 Other chronic pain: Secondary | ICD-10-CM | POA: Diagnosis present

## 2022-04-09 DIAGNOSIS — Z5900 Homelessness unspecified: Secondary | ICD-10-CM

## 2022-04-09 DIAGNOSIS — J449 Chronic obstructive pulmonary disease, unspecified: Secondary | ICD-10-CM | POA: Diagnosis not present

## 2022-04-09 DIAGNOSIS — E662 Morbid (severe) obesity with alveolar hypoventilation: Secondary | ICD-10-CM

## 2022-04-09 DIAGNOSIS — R45851 Suicidal ideations: Secondary | ICD-10-CM | POA: Diagnosis present

## 2022-04-09 DIAGNOSIS — F4310 Post-traumatic stress disorder, unspecified: Secondary | ICD-10-CM | POA: Diagnosis present

## 2022-04-09 DIAGNOSIS — I209 Angina pectoris, unspecified: Secondary | ICD-10-CM | POA: Diagnosis not present

## 2022-04-09 DIAGNOSIS — E876 Hypokalemia: Secondary | ICD-10-CM | POA: Diagnosis not present

## 2022-04-09 DIAGNOSIS — R45850 Homicidal ideations: Secondary | ICD-10-CM | POA: Diagnosis present

## 2022-04-09 DIAGNOSIS — G40909 Epilepsy, unspecified, not intractable, without status epilepticus: Secondary | ICD-10-CM

## 2022-04-09 DIAGNOSIS — G40802 Other epilepsy, not intractable, without status epilepticus: Secondary | ICD-10-CM | POA: Diagnosis present

## 2022-04-09 DIAGNOSIS — I5030 Unspecified diastolic (congestive) heart failure: Secondary | ICD-10-CM | POA: Diagnosis not present

## 2022-04-09 DIAGNOSIS — K219 Gastro-esophageal reflux disease without esophagitis: Secondary | ICD-10-CM

## 2022-04-09 DIAGNOSIS — E039 Hypothyroidism, unspecified: Secondary | ICD-10-CM | POA: Diagnosis present

## 2022-04-09 DIAGNOSIS — F1120 Opioid dependence, uncomplicated: Secondary | ICD-10-CM | POA: Diagnosis present

## 2022-04-09 DIAGNOSIS — M5430 Sciatica, unspecified side: Secondary | ICD-10-CM | POA: Diagnosis present

## 2022-04-09 DIAGNOSIS — G4733 Obstructive sleep apnea (adult) (pediatric): Secondary | ICD-10-CM | POA: Diagnosis present

## 2022-04-09 DIAGNOSIS — F333 Major depressive disorder, recurrent, severe with psychotic symptoms: Principal | ICD-10-CM | POA: Diagnosis present

## 2022-04-09 DIAGNOSIS — F251 Schizoaffective disorder, depressive type: Principal | ICD-10-CM

## 2022-04-09 LAB — BASIC METABOLIC PANEL
ANION GAP: 14 mmol/L (ref 10–22)
BUN (UREA NITROGEN): 26 mg/dL — ABNORMAL HIGH (ref 7–18)
CALCIUM: 10.5 mg/dL (ref 8.5–10.5)
CARBON DIOXIDE: 37 mmol/L — ABNORMAL HIGH (ref 21–32)
CHLORIDE: 83 mmol/L — ABNORMAL LOW (ref 98–107)
CREATININE: 1 mg/dL (ref 0.4–1.2)
ESTIMATED GLOMERULAR FILT RATE: >60 mL/min (ref >60)
Glucose Random: 138 mg/dL (ref 74–160)
POTASSIUM: 3 mmol/L — ABNORMAL LOW (ref 3.5–5.1)
SODIUM: 134 mmol/L — ABNORMAL LOW (ref 136–145)

## 2022-04-09 LAB — PHOSPHORUS MAGNESIUM
MAGNESIUM: 2 mg/dL (ref 1.6–2.6)
PHOSPHORUS: 3.6 mg/dL (ref 2.5–4.9)

## 2022-04-09 MED ORDER — POTASSIUM CHLORIDE CRYS ER 20 MEQ PO TBCR
40.00 meq | EXTENDED_RELEASE_TABLET | Freq: Two times a day (BID) | ORAL | Status: AC
Start: 2022-04-10 — End: 2022-04-10
  Administered 2022-04-10 (×2): 40 meq via ORAL
  Filled 2022-04-09 (×2): qty 2

## 2022-04-09 MED ORDER — ALBUTEROL SULFATE HFA 108 (90 BASE) MCG/ACT IN AERS
2.0000 | INHALATION_SPRAY | Freq: Four times a day (QID) | RESPIRATORY_TRACT | Status: DC | PRN
Start: 2022-04-09 — End: 2022-04-12
  Administered 2022-04-11: 2 via RESPIRATORY_TRACT

## 2022-04-09 MED ORDER — POTASSIUM CHLORIDE CRYS ER 20 MEQ PO TBCR
40.00 meq | EXTENDED_RELEASE_TABLET | Freq: Once | ORAL | Status: AC
Start: 2022-04-09 — End: 2022-04-09
  Administered 2022-04-09: 40 meq via ORAL
  Filled 2022-04-09: qty 2

## 2022-04-09 MED ORDER — OXCARBAZEPINE 300 MG PO TABS
300.0000 mg | ORAL_TABLET | Freq: Three times a day (TID) | ORAL | Status: DC
Start: 2022-04-09 — End: 2022-04-12
  Administered 2022-04-09 – 2022-04-12 (×10): 300 mg via ORAL
  Filled 2022-04-09 (×11): qty 1

## 2022-04-09 MED ORDER — ALUMINUM-MAGNESIUM-SIMETHICONE 200-200-20 MG/5ML PO SUSP
30.0000 mL | ORAL | Status: DC | PRN
Start: 2022-04-09 — End: 2022-04-12

## 2022-04-09 MED ORDER — BUPRENORPHINE HCL-NALOXONE HCL 8-2 MG SL SUBL
8.0000 mg | SUBLINGUAL_TABLET | Freq: Three times a day (TID) | SUBLINGUAL | Status: DC
Start: 2022-04-09 — End: 2022-04-12
  Administered 2022-04-09 – 2022-04-12 (×11): 1 via SUBLINGUAL
  Filled 2022-04-09 (×11): qty 1

## 2022-04-09 MED ORDER — SPIRONOLACTONE 25 MG PO TABS
50.0000 mg | ORAL_TABLET | Freq: Every day | ORAL | Status: DC
Start: 2022-04-10 — End: 2022-04-09

## 2022-04-09 MED ORDER — LORAZEPAM 0.5 MG PO TABS
0.5000 mg | ORAL_TABLET | Freq: Three times a day (TID) | ORAL | Status: DC | PRN
Start: 2022-04-09 — End: 2022-04-12
  Administered 2022-04-09 – 2022-04-12 (×5): 0.5 mg via ORAL
  Filled 2022-04-09 (×5): qty 1

## 2022-04-09 MED ORDER — TORSEMIDE 40 MG PO TABS
80.0000 mg | ORAL_TABLET | Freq: Every day | ORAL | 0 refills | Status: DC
Start: 2022-04-09 — End: 2022-04-12

## 2022-04-09 MED ORDER — SENNOSIDES 8.6 MG PO TABS
17.2000 mg | ORAL_TABLET | Freq: Two times a day (BID) | ORAL | Status: DC
Start: 2022-04-09 — End: 2022-04-12
  Administered 2022-04-09 – 2022-04-12 (×6): 17.2 mg via ORAL
  Filled 2022-04-09 (×6): qty 2

## 2022-04-09 MED ORDER — POTASSIUM CHLORIDE CRYS ER 20 MEQ PO TBCR
40.0000 meq | EXTENDED_RELEASE_TABLET | Freq: Two times a day (BID) | ORAL | Status: DC
Start: 2022-04-09 — End: 2022-04-09

## 2022-04-09 MED ORDER — LEVETIRACETAM 500 MG PO TABS
750.0000 mg | ORAL_TABLET | Freq: Two times a day (BID) | ORAL | Status: DC
Start: 2022-04-09 — End: 2022-04-12
  Administered 2022-04-09 – 2022-04-12 (×6): 750 mg via ORAL
  Filled 2022-04-09 (×6): qty 1

## 2022-04-09 MED ORDER — ASPIRIN 81 MG PO CHEW
324.0000 mg | CHEWABLE_TABLET | Freq: Every day | ORAL | Status: DC
Start: 2022-04-10 — End: 2022-04-12
  Administered 2022-04-10 – 2022-04-12 (×3): 324 mg via ORAL
  Filled 2022-04-09 (×3): qty 4

## 2022-04-09 MED ORDER — SPIRONOLACTONE 100 MG PO TABS
100.0000 mg | ORAL_TABLET | Freq: Every day | ORAL | Status: DC
Start: 2022-04-10 — End: 2022-04-10
  Administered 2022-04-10: 100 mg via ORAL
  Filled 2022-04-09: qty 1

## 2022-04-09 MED ORDER — NICOTINE POLACRILEX 2 MG MT GUM
4.0000 mg | CHEWING_GUM | OROMUCOSAL | Status: DC | PRN
Start: 2022-04-09 — End: 2022-04-12
  Administered 2022-04-09 – 2022-04-12 (×13): 4 mg via ORAL
  Filled 2022-04-09 (×14): qty 2

## 2022-04-09 MED ORDER — SENNOSIDES 8.6 MG PO TABS
17.2000 mg | ORAL_TABLET | Freq: Every evening | ORAL | Status: DC
Start: 2022-04-09 — End: 2022-04-09

## 2022-04-09 MED ORDER — TIOTROPIUM BROMIDE MONOHYDRATE 2.5 MCG/ACT IN AERS
2.0000 | INHALATION_SPRAY | Freq: Every day | RESPIRATORY_TRACT | Status: DC
Start: 2022-04-10 — End: 2022-04-12
  Administered 2022-04-10 – 2022-04-12 (×3): 2 via RESPIRATORY_TRACT
  Filled 2022-04-09 (×2): qty 4

## 2022-04-09 MED ORDER — BACLOFEN 10 MG PO TABS
10.0000 mg | ORAL_TABLET | Freq: Two times a day (BID) | ORAL | Status: DC
Start: 2022-04-09 — End: 2022-04-12
  Administered 2022-04-09 – 2022-04-12 (×6): 10 mg via ORAL
  Filled 2022-04-09 (×6): qty 1

## 2022-04-09 MED ORDER — BISACODYL 10 MG PR SUPP
10.0000 mg | Freq: Every day | RECTAL | Status: DC | PRN
Start: 2022-04-09 — End: 2022-04-12
  Administered 2022-04-11: 10 mg via RECTAL
  Filled 2022-04-09: qty 1

## 2022-04-09 MED ORDER — IBUPROFEN 600 MG PO TABS
600.0000 mg | ORAL_TABLET | Freq: Four times a day (QID) | ORAL | Status: DC | PRN
Start: 2022-04-09 — End: 2022-04-12
  Administered 2022-04-09 – 2022-04-12 (×4): 600 mg via ORAL
  Filled 2022-04-09 (×4): qty 1

## 2022-04-09 MED ORDER — LACTULOSE 10 GM/15ML PO SOLN
20.0000 g | Freq: Every day | ORAL | Status: DC
Start: 2022-04-09 — End: 2022-04-10
  Administered 2022-04-09 – 2022-04-10 (×2): 20 g via ORAL
  Filled 2022-04-09 (×2): qty 30

## 2022-04-09 MED ORDER — QUETIAPINE FUMARATE 100 MG PO TABS
100.0000 mg | ORAL_TABLET | Freq: Every evening | ORAL | Status: DC
Start: 2022-04-09 — End: 2022-04-12
  Administered 2022-04-09 – 2022-04-11 (×3): 100 mg via ORAL
  Filled 2022-04-09 (×3): qty 1

## 2022-04-09 MED ORDER — TORSEMIDE 20 MG PO TABS
80.0000 mg | ORAL_TABLET | Freq: Every day | ORAL | Status: DC
Start: 2022-04-10 — End: 2022-04-09

## 2022-04-09 MED ORDER — METHOCARBAMOL 500 MG PO TABS
500.0000 mg | ORAL_TABLET | Freq: Three times a day (TID) | ORAL | Status: DC
Start: 2022-04-09 — End: 2022-04-12
  Administered 2022-04-09 – 2022-04-12 (×10): 500 mg via ORAL
  Filled 2022-04-09 (×11): qty 1

## 2022-04-09 MED ORDER — LIDOCAINE 4 % EX PTCH
1.0000 | MEDICATED_PATCH | CUTANEOUS | Status: DC
Start: 2022-04-09 — End: 2022-04-12
  Administered 2022-04-12: 1 via TOPICAL
  Filled 2022-04-09 (×4): qty 1

## 2022-04-09 MED ORDER — TORSEMIDE 100 MG PO TABS
100.0000 mg | ORAL_TABLET | Freq: Every day | ORAL | Status: DC
Start: 2022-04-10 — End: 2022-04-10
  Administered 2022-04-10: 100 mg via ORAL
  Filled 2022-04-09: qty 1

## 2022-04-09 MED ORDER — GABAPENTIN 300 MG PO CAPS
600.0000 mg | ORAL_CAPSULE | Freq: Three times a day (TID) | ORAL | Status: DC
Start: 2022-04-09 — End: 2022-04-12
  Administered 2022-04-09 – 2022-04-12 (×10): 600 mg via ORAL
  Filled 2022-04-09 (×11): qty 2

## 2022-04-09 MED ORDER — DIPHENHYDRAMINE HCL 50 MG PO CAPS
50.0000 mg | ORAL_CAPSULE | Freq: Four times a day (QID) | ORAL | Status: DC | PRN
Start: 2022-04-09 — End: 2022-04-12

## 2022-04-09 MED ORDER — HALOPERIDOL 10 MG PO TABS
10.0000 mg | ORAL_TABLET | Freq: Four times a day (QID) | ORAL | Status: DC | PRN
Start: 2022-04-09 — End: 2022-04-12
  Administered 2022-04-12: 10 mg via ORAL
  Filled 2022-04-09 (×2): qty 1

## 2022-04-09 MED ORDER — NICOTINE 21 MG/24HR TD PT24
1.0000 | MEDICATED_PATCH | Freq: Every day | TRANSDERMAL | Status: DC
Start: 2022-04-10 — End: 2022-04-12
  Administered 2022-04-10 – 2022-04-11 (×2): 1 via TRANSDERMAL
  Filled 2022-04-09 (×3): qty 1

## 2022-04-09 MED ORDER — TRAZODONE HCL 50 MG PO TABS
50.0000 mg | ORAL_TABLET | Freq: Every evening | ORAL | Status: DC | PRN
Start: 2022-04-09 — End: 2022-04-12

## 2022-04-09 MED ORDER — PANTOPRAZOLE SODIUM 40 MG PO TBEC
40.0000 mg | DELAYED_RELEASE_TABLET | Freq: Every day | ORAL | Status: DC
Start: 2022-04-10 — End: 2022-04-12
  Administered 2022-04-10 – 2022-04-12 (×3): 40 mg via ORAL
  Filled 2022-04-09 (×3): qty 1

## 2022-04-09 MED ORDER — POLYETHYLENE GLYCOL 3350 17 G PO PACK
17.0000 g | PACK | Freq: Every day | ORAL | Status: DC
Start: 2022-04-10 — End: 2022-04-12
  Administered 2022-04-10 – 2022-04-12 (×3): 17 g via ORAL
  Filled 2022-04-09 (×3): qty 1

## 2022-04-09 MED ORDER — NYSTATIN 100000 UNIT/GM EX POWD
Freq: Three times a day (TID) | CUTANEOUS | Status: DC
Start: 2022-04-09 — End: 2022-04-12
  Filled 2022-04-09: qty 30

## 2022-04-09 MED ORDER — PRAZOSIN HCL 1 MG PO CAPS
1.0000 mg | ORAL_CAPSULE | Freq: Every evening | ORAL | Status: DC
Start: 2022-04-09 — End: 2022-04-12
  Administered 2022-04-09 – 2022-04-11 (×3): 1 mg via ORAL
  Filled 2022-04-09 (×3): qty 1

## 2022-04-09 MED ORDER — POTASSIUM CHLORIDE CRYS ER 20 MEQ PO TBCR
40.0000 meq | EXTENDED_RELEASE_TABLET | Freq: Two times a day (BID) | ORAL | Status: DC
Start: 2022-04-10 — End: 2022-04-09

## 2022-04-09 MED ORDER — FLUTICASONE PROPIONATE 50 MCG/ACT NA SUSP
1.0000 | Freq: Every day | NASAL | Status: DC
Start: 2022-04-10 — End: 2022-04-12
  Administered 2022-04-10 – 2022-04-11 (×2): 1 via NASAL
  Filled 2022-04-09: qty 16

## 2022-04-09 MED ORDER — MAGNESIUM HYDROXIDE 400 MG/5ML PO SUSP
30.0000 mL | Freq: Every day | ORAL | Status: DC | PRN
Start: 2022-04-09 — End: 2022-04-12

## 2022-04-09 MED ORDER — ONDANSETRON 4 MG PO TBDP
4.0000 mg | ORAL_TABLET | Freq: Three times a day (TID) | ORAL | Status: DC | PRN
Start: 2022-04-09 — End: 2022-04-12
  Administered 2022-04-09: 4 mg via ORAL
  Filled 2022-04-09: qty 1

## 2022-04-09 MED ORDER — LEVOTHYROXINE SODIUM 25 MCG PO TABS
25.0000 ug | ORAL_TABLET | Freq: Every morning | ORAL | Status: DC
Start: 2022-04-10 — End: 2022-04-12
  Administered 2022-04-10 – 2022-04-12 (×3): 25 ug via ORAL
  Filled 2022-04-09 (×4): qty 1

## 2022-04-09 NOTE — Discharge Summary (Addendum)
Physician Discharge Summary     Patient ID:  Cheryl Zimmerman  [id number]  51 year old  13-Mar-1971    Admit date: 04/07/22      Discharge date and time: 04/09/2022 11:40 AM    Admitting Physician: Alphia Kava, MD     Discharge Physician: Rayburn Go, MD    Admission Diagnoses: Angina pectoris syndrome [facility]) [I20.9]  Chest pain, atypical [R07.89]    Discharge Diagnoses: Active Hospital Problems    Chest pain, atypical      *Stable angina ([name])        Admission Condition: fair    Discharged Condition: good    PCP Follow-up/Outstanding Issues  [ ]  COPD - started on spiriva inpatient, f/u if helps patient   [ ]  Stable angina   [ ]  Shoulder pain     Indication for Admission: chest pain    Hospital Course:   Overall Summary  Cheryl Zimmerman is a 51yo female with a history of PTSD, MDD, COPD (on 2L at home), GERD, and HFpEF (70%, on home torsemide) who was admitted with exertional chest pain significant for stable angina. She was medically cleared and transferred back to the psych floor.    Hospital Course By Problem  #Chest Pain  #Stable angina  #HFpEF  Patient admitted for episodes of stabbing chest pain that radiates to her shoulder with exertion and resolves with nitroglycerin. Troponins and EKG were reassuring against ACS. Previous nuclear echo in February 2023 was normal. Limited echo today showed no acute changes from previous ECHO. Should continue statin and aspirin and nitroglycerin as needed for chest pain.     #COPD  Patient has long smoking history and is on 2L NC outpatient. She uses albuterol prn but no other control medications. Lungs clear with no crackles or wheezing and did not require steroids. Started spiriva inpatient with plan to continue at home.     #Hypokalemia, improving  Potassium low likely secondary to metolazone. Has had this adverse reaction in the past. Corrected with PO potassium; would avoid metolazone in the future.     #Shoulder pain  Pt to be seen by OT prior to discharge, can  continue with them outpatient if needs.    Consults:  IP CONSULT TO PHYSICAL THERAPY (EVAL AND TREAT)  IP CONSULT TO OCCUPATIONAL THERAPY (EVAL AND TREAT)    Significant Diagnostic Studies:     Recent Labs     04/07/22  1208 04/08/22  0705   NA 133* 134*   K 3.1* 2.8*   CL 83* 83*   CO2 36* 40*   BUN 20* 24*   CREAT 0.9 0.8   GFR > 60 > 60   ANION 14 11   GLUCOSER 130 117   WBC 7.4  --    HGB 14.3  --    HCT 42.4  --    PLTA 387  --    ALBUMIN 4.5  --    AST 22  --    ALT 27  --    ALKPHOS 212*  --    TBILI 0.4  --    DBILI < 0.2  --    CA 9.9 9.6     TROPONIN T HS RANDOM (ng/L)   Date Value   04/07/2022 24 (H)     TROPONIN T HS 3 HOUR RESULT (ng/L)   Date Value   04/07/2022 22 (H)     DELTA 1 HOUR TROPONIN T HS (ng/L)   Date Value  01/11/2022 0     DELTA 3 HOUR TROPONIN T HS (ng/L)   Date Value   04/07/2022 2         NT-proBNP (pg/mL)   Date Value   04/07/2022 < 36   03/31/2022 39   03/30/2022 53       Micro:  No results for input(s): BLCA, BLCAN in the last 72 hours.  No results found for: Benjaman Lobe    Imaging:   Limited Echocardiogram w/ Ultrasound Contrast    Result Date: 04/08/2022  Conclusions: (click link below for full report) 1. Left Ventricle: Global systolic function: EF is estimated visually at 65% . 2. 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 The remaining left ventricular segments contract normally. 3. Right Ventricle: Moderately dilated. Systolic function is normal. 4. Tricuspid Valve: There is insufficient regurgitation to estimate RVSP. 5. 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 6. As compared to the previous echocardiogram on 12/07/2021, the ascending aorta was found dilated at 3.7 cm from normal. Otherwise, no  obvious changes.    XR Chest Portable    Result Date: 04/07/2022  TECHNIQUE: Portable chest, April 07, 2022. 11:19 a.m. Two images Indication: chest and left sided abdominal pain Comparison: July 14 FINDINGS: Quality: Satisfactory.   Tubes/lines: None. Lungs: The lungs are clear. Pleura: There is no pleural effusion or pneumothorax. Heart: The cardiac silhouette is unremarkable. Mediastinum/hila: Unremarkable. Bones and Soft Tissues: Unremarkable       No acute cardiopulmonary findings on portable chest radiograph. Reviewed and Electronically Signed By: Signe Colt, MD Signed Date and Time: 04/07/2022 12:52 PM       EKG: NSR, inferior infarct unknown age     Treatments: As per hospital course above.    Discharge Exam:  Temp:  [98 F (36.7 C)-98.2 F (36.8 C)] 98.2 F (36.8 C)  Pulse:  [71-91] 79  Resp:  [16-20] 16  BP: (97-141)/(67-85) 120/83  FiO2 (%):  [21 %] 21 %  General: Interactive, sitting up in bed, in no acute distress, answers questions appropriately, cooperative with exam.  Lungs: Non-labored respirations. CTAB, no W/R/C. B/L equal chest expansion.  Heart: RRR, +S1/S2, no M/R/G. No tachycardia.   Pulses: 2+ DP pulses, symmetric bilaterally.  Abdomen: +BS, soft, NTND, no guarding or rebound, no organomegaly or masses.  Extremities: 1+ BLE edema to midshin. Warm, moving four extremities independently.  Skin: numerous well healed scars on both arms  Neuro: AAO x 4. CN II-XII grossly intact. Gait not tested.    Disposition: return to psych unit     MEDICATION CHANGES:  [ ]  metolazone was discontinued  [ ]  torsemide dose is now 80mg  daily       Medication List      CHANGE how you take these medications    * Torsemide 40 MG Tabs  Take 80 mg by mouth in the morning.  What changed: You were already taking a medication with the same name, and this prescription was added. Make sure you understand how and when to take each.         * This list has 2 medication(s) that are the same as other medications prescribed  for you. Read the directions carefully, and ask your doctor or other care provider to  review them with you.            CONTINUE taking these medications    albuterol HFA 108 (90 Base) MCG/ACT inhaler  Inhale 2 puffs into the lungs every 6 (six) hours as needed for Wheezing     atorvastatin 80 MG tablet  Commonly known as: LIPITOR  Take 1 tablet by mouth in the morning.     budesonide-formoterol 160-4.5 MCG/ACT inhaler  Commonly known as: SYMBICORT  Inhale 2 puffs into the lungs in the morning and 2 puffs before bedtime.     FLUoxetine 20 MG capsule  Commonly known as: PROzac  Take 1 capsule by mouth in the morning.     gabapentin 300 MG capsule  Commonly known as: NEURONTIN  Take 2 capsules by mouth in the morning and 2 capsules at noon and 2 capsules before bedtime.     levETIRAcetam 750 MG tablet  Commonly known as: KEPPRA  Take 1 tablet by mouth in the morning and 1 tablet before bedtime.     levothyroxine 25 MCG tablet  Commonly known as: SYNTHROID  Take 1 tablet by mouth every morning before breakfast     loxapine 25 MG capsule  Commonly known as: LOXITANE  Take 1 capsule by mouth in the morning and 1 capsule before bedtime.     OTHER MEDICATION  Wheelchair: Electric  For use as directed.    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 Begin with 2 LPM and monitor oxygen saturation using a pulse oximeter.  2. If oxygen saturation is below 90%, increase the flow rate by 0.5 LPM every 15-30 minutes until the desired saturation level of 90%-95% is achieved.  3. If oxygen saturation is above 95%, decrease the flow rate by 0.5 LPM every 15-30 minutes until the desired saturation level of 90%-95% is achieved.  4. Once the desired saturation level is achieved, maintain the flow rate at that level.    DX: COPD with Hypoxia, Requires continuous  supplemental oxygen  ICD10: Code: J44.9 and Z99.81    SIGNED Georgianne Fick, CNP, 01/05/2022    Nurse Practitioner   Hospitalist Division  [facility]  Units [place] [facility] and [facility]  Phone:[phone] (fastest) or [phone]   Email:[email or web address]     NPI: [id number]     OXcarbazepine 150 MG tablet  Commonly known as: TRILEPTAL  Take 1 tablet by mouth in the morning and 1 tablet before bedtime.     prazosin 1 MG capsule  Commonly known as: MINIPRESS  Take 3 capsules by mouth nightly     QUEtiapine 50 MG tablet  Commonly known as: SEROquel  Take 2 tablets by mouth at bedtime     spironolactone 50 MG tablet  Commonly known as: ALDACTONE  Take 1 tablet by mouth in the morning.                   Activity: activity as tolerated  Diet: regular diet  Wound Care: none needed    Code Status during hospital stay: Full Code    [place] Future Appointments:  Current and Future Appointments at [facility] [phone] Days) 04/09/2022 - 07/08/2022    None          Follow-up:  Follow-up Information  None         Signed:  Sheldon Silvan  04/09/2022  11:40 AM       I personally examined the patient along with the medical student. I agree with and have edited the discharge summary as above.    Cejay Cambre P Heloise Gordan, MD, 04/09/2022  PGY-3

## 2022-04-09 NOTE — Progress Notes (Signed)
Cheryl Zimmerman has mostly been in her room. She has declined activities over watching movie on a tablet in the room. Her Cheryl Zimmerman signs were within normal limits with an 02sat of 88% on RA and 94% with 02 on at 2L via Nasal cannula. She ate 100% of dinner and took medications as prescribed, except the lidocaine patch.  She requested and received nicorette gum x 2 for nicotine craving with good effect, Zofran for  Nausea with relief, ativan for anxiety and motrin for knee and shoulder pain with results. Otherwise, Cheryl Zimmerman was calm and cooperative, quiet. She voiced no complaints and reported no further discomfort through remainder of shift. No BM this shift. Will continue to maintain the safety checks every 5 minutes as ordered.

## 2022-04-09 NOTE — Case Mgmt Note (Signed)
Per chart review patient was discharged back to Inpatient Psych  [facility]. No CM needs noted.

## 2022-04-09 NOTE — Plan of Care (Signed)
Assumed care of patient at 1900. Patient sitting up in bed watching television. Complained of headache but declined the need of medication. Denied chest pain, palpitations or dizziness. Was anxious about getting back up to [facility] this evening, then asked to stay because she took her evening meds and did not want to leave the unit after that. Admitting and [facility]2 agreed to transfer her in the morning. Medicated per MAR. PRN Nicorette given per request. Prazosin held BP 97/67 plus patient declined. Using walker in room for ambulation. Bed in lowest position, call light within reach and safety maintained.

## 2022-04-09 NOTE — Plan of Care (Addendum)
Alert and oriented x4. Pleasant and cooperative with staff. Independent with ADLs. Denies pain or SOB. Adequate for discharge back to [facility]. Report given at 1030.
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
## 2022-04-09 NOTE — Progress Notes (Deleted)
Per discharge summary yesterday, patient is MEDICALLY CLEAR for discharge back to psychiatry.

## 2022-04-09 NOTE — H&P (Signed)
Department of Psychiatry  Admission Physical Examination, Review of Systems, & Medical Clearance      History of Present Illness: 21F w/ PMHx HFpEF, seizure disorder, hypothyroidism, ?COPD/OSA, GERD, chronic pain, spinal stenosis w/ sciatica, polysubstance use, schizoaffective disorder bipolar type, PTSD, and SI p/w SI/HI, admitted to [facility] on 7/[facility], transferred to medicine 7/20 for chest pain, back to [facility] on 7/22.     Reports feeling overall better. Breathing/ dyspnea and peripheral edema has improved. Urinates very little on only torsemide 80 qd w/ spironolactone 50 qd. Denies any chest pain. No cough or fevers. Interested in CPAP if found a mask she would tolerate (such as nasal pillows). No BM in ~1 week (large amount at that time). Usually has BM two times per week. No abd pain and does not currently feel constipated. States miralax, milk of mag, and "a pill"- possibly colace, all do not work for her. Has found enemas very effective. Has never tried lactulose.       Past Medical History:   Past Medical History:  schizo: Bipolar affective ([name])  No date: Chronic systolic CHF (congestive heart failure) ([name])  No date: Depression  12/23/2021: Fall in home  12/23/2021: Gait instability  12/23/2021: Requires continuous at home supplemental oxygen  depress: Schizo affective schizophrenia ([name])  12/23/2021: Spinal stenosis of lumbar 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   claudication  No date: Substance abuse [facility])    Past Surgical History:   No past surgical history on file.    Medications prior to Admission:   Prior to Admission Medications   Prescriptions Last Dose Informant Patient Reported? Taking?   FLUoxetine (PROZAC) 20 MG capsule   No No   Sig: Take 1 capsule by mouth in the morning.   OTHER MEDICATION   No No   Sig: Wheelchair: Electric  For use as directed.    DX: Gait instability and falls due to lumbar stenosis of central canal     ICD10: R26.81   OTHER MEDICATION   No No   Sig: Device: Inogen 3  Oxygen Flow Rate: 2-4  LPM  Titration Goal: SpO2 between 90% and 95%  Instructions:  1. Begin with 2 LPM and monitor oxygen saturation using a pulse oximeter.  2. If oxygen saturation is below 90%, increase the flow rate by 0.5 LPM every 15-30 minutes until the desired saturation level of 90%-95% is achieved.  3. If oxygen saturation is above 95%, decrease the flow rate by 0.5 LPM every 15-30 minutes until the desired saturation level of 90%-95% is achieved.  4. Once the desired saturation level is achieved, maintain the flow rate at that level.    DX: COPD with Hypoxia, Requires continuous supplemental oxygen  ICD10: Code: J44.9 and Z99.81    SIGNED Georgianne Fick, CNP, 01/05/2022    Nurse Practitioner   Hospitalist Division  [facility]  Units [place] and [address]  Phone:[phone] (fastest) or [phone]   Email:[email or web address]     NPI: [id number]   OXcarbazepine (TRILEPTAL) 150 MG tablet   No No   Sig: Take 1 tablet by mouth in the morning and 1 tablet before bedtime.   QUEtiapine (SEROQUEL) 50 MG tablet   No No   Sig: Take 2 tablets by mouth at bedtime   Torsemide 40 MG TABS   No No   Sig: Take 80 mg by mouth in the morning.   albuterol HFA 108 (90 Base) MCG/ACT inhaler   No No   Sig: Inhale  2 puffs into the lungs every 6 (six) hours as needed for Wheezing   atorvastatin (LIPITOR) 80 MG tablet   No No   Sig: Take 1 tablet by mouth in the morning.   budesonide-formoterol (SYMBICORT) 160-4.5 MCG/ACT inhaler   No No   Sig: Inhale 2 puffs into the lungs in the morning and 2 puffs before bedtime.   gabapentin (NEURONTIN) 300 MG capsule   No No   Sig: Take 2 capsules by mouth in the morning and 2 capsules at noon and 2 capsules before bedtime.   levETIRAcetam (KEPPRA) 750 MG tablet   No No   Sig: Take 1 tablet by mouth in the morning and 1 tablet before bedtime.   levothyroxine (SYNTHROID) 25 MCG tablet   No No   Sig: Take 1 tablet by mouth every morning before breakfast   loxapine (LOXITANE) 25 MG capsule   No No   Sig: Take 1  capsule by mouth in the morning and 1 capsule before bedtime.   prazosin (MINIPRESS) 1 MG capsule   No No   Sig: Take 3 capsules by mouth nightly   spironolactone (ALDACTONE) 50 MG tablet   No No   Sig: Take 1 tablet by mouth in the morning.      Facility-Administered Medications: None       Allergies:   Review of Patient's Allergies indicates:   Bee venom               Anaphylaxis   Metolazone              Other (See Comments)    Comment:Hypokalemia   Methylprednisolone      Dizziness, Drowsiness    Comment:Also believes syncope. Seems to tolerate             inhalational and epidural steroid.   Nutritional supplem*       Hydroxyzine             Nausea Only    Social History:  Tobacco Use: yes  Alcohol: no    Family History:   Mother and father have no hx of CAD.    Review of Systems: No fevers, SOB, CP, abd pain, n/v/d, no focal weakness.   All other organ systems are reviewed and are negative.    Physical Exam:  Vital Signs - Last 8 Hours:   BP: (119-139)/(83-89)   Temp:  [97 F (36.1 C)-98.2 F (36.8 C)]   Pulse:  [79-91]   Resp:  [16-18]   SpO2:  [92 %-94 %]     GEN: NAD, sitting up in bed  EENT: EOMI, clear sclera  Lungs: CTAB, respirations unlabored  Heart: RRR  Abd: Soft, non-tender, non-distended  Neuro: No focal deficits, alert  Skin: warm, dry  Extremities: 2-3 + pitting edema B/L LE  Psych: normal affect     Cranial Nerves:   II: PERRLA  III, IV, VI: EOMs intact b/l without nystagmus  V, VII: facial strength and sensation intact and symmetric  VIII: Intact to voice  IX, X: palatal elevation symmetrical  XI: Shoulder shrug symmetrical and strength intact  XII: tongue midline without fasciculations      Recent Labs:  BMP:   Recent Labs     04/07/22  1208 04/08/22  0705   NA 133* 134*   K 3.1* 2.8*   CL 83* 83*   CO2 36* 40*   BUN 20* 24*   CREAT 0.9 0.8   GLUCOSER 130 117  Ca, Mg, Phos:   Recent Labs     04/07/22  1208 04/08/22  0705   CA 9.9 9.6     CBC:   Recent Labs     04/07/22  1208   WBC 7.4    HCT 42.4   PLTA 387   RBC 4.76     Coagulation Labs: No results for input(s): PT, PTT, INR in the last 72 hours.  LFTs:   Recent Labs     04/07/22  1208   AST 22   ALT 27   TBILI 0.4   DBILI < 0.2   ALKPHOS 212*     Pancreatic Enzymes: No results for input(s): AMY, LIP in the last 72 hours.  Urinalysis:No results for input(s): UACOL, UACLA, UAGLU, UABIL, UAKET, SPEGRAVURINE, UAOCC, UAPH, UAPRO, UANIT, LEUKOCYTES in the last 72 hours.    Invalid input(s): UAOBU  Troponin: No results for input(s): TROPI in the last 72 hours.    Imaging:     Limited Echocardiogram w/ Ultrasound Contrast    Result Date: 04/08/2022  Conclusions: (click link below for full report) 1. Left Ventricle: Global systolic function: EF is estimated visually at 65% . 2. 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 The remaining left ventricular segments contract normally. 3. Right Ventricle: Moderately dilated. Systolic function is normal. 4. Tricuspid Valve: There is insufficient regurgitation to estimate RVSP. 5. 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 6. As compared to the previous echocardiogram on 12/07/2021, the ascending aorta was found dilated at 3.7 cm from normal. Otherwise, no obvious changes.    XR Chest Portable    Result Date: 04/07/2022  TECHNIQUE: Portable chest, April 07, 2022. 11:19 a.m. Two images Indication: chest and left sided abdominal pain Comparison: July 14 FINDINGS: Quality: Satisfactory.   Tubes/lines: None. Lungs: The lungs are clear. Pleura: There is no pleural effusion or pneumothorax. Heart: The cardiac silhouette is unremarkable. Mediastinum/hila: Unremarkable. Bones and Soft Tissues: Unremarkable       No acute cardiopulmonary findings on  portable chest radiograph. Reviewed and Electronically Signed By: Signe Colt, MD Signed Date and Time: 04/07/2022 12:52 PM     XR Chest 2 views    Result Date: 04/01/2022    TECHNIQUE: Chest, 2 views  INDICATION: CHF COMPARISON: 03/23/2022 FINDINGS: Lungs: The lungs are clear and the pulmonary vasculature is normal. Pleura: There is no pleural effusion or pneumothorax. Heart: The cardiac silhouette is unremarkable. Mediastinum/hila: Unremarkable Bones and Soft Tissues: There is a scoliosis.     No pneumonia or CHF.  Reviewed and Electronically Signed By: Frances Maywood, MD Signed Date and Time: 04/01/2022 4:43 PM      XR Chest Portable    Result Date: 03/23/2022    TECHNIQUE: Portable chest, 10:43 a.m.  Indication: eval for pulm edema sat 93% Comparison: 01/11/2022 FINDINGS: Quality: Satisfactory.   Tubes/lines: None. Lungs: The lungs appear clear. Pleura: There is no pleural effusion or pneumothorax. Heart: The cardiac silhouette is unchanged. Mediastinum/hila: Unremarkable. Bones and Soft Tissues: There is convex right curvature of the thoracic spine. There is right glenohumeral degenerative joint disease. A metallic density is redemonstrated overlying the cervical spine.       No acute cardiopulmonary findings on portable chest radiograph.  Reviewed and Electronically  Signed By: Rosine Abe, MD Signed Date and Time: 03/23/2022 10:54 AM        Assessment and Plan  26F w/ PMHx HFpEF, seizure disorder, hypothyroidism, ?COPD/OSA, GERD, chronic pain, spinal stenosis w/ sciatica, polysubstance use, schizoaffective disorder bipolar type, PTSD, and SI p/w SI/HI, admitted to [facility] on 7/6, transferred to medicine 7/20 for chest pain, back to [facility] on 7/22.     #HFpEF  -no exacerbation  -recently during inpatient psych admission, torsemide up titrated to 80 bid which she tolerated well but felt inadequate due to decreased UOP, increased peripheral edema/ dyspnea so addition of metolazone resulted in hypokalemia (second time  unable to tolerate trial of metolazone trial).   -previously on up to torsemide 120 tid but mostly had seemed to do well on 100 bid (these were doses without spironolactone which was added during a recent med admission when she first presented for hypokalemia)  -was discharged from medicine most recent on torsemide 80 qd + spironolactone 50 qd while her K+ was continued to be replenished (still hypokalemic, though improved w/ supplementation)  -plan to increase spironolactone to 100 qd and torsemide 100 bid pending repeat BMP (only ordered spironolactone 100 qd and torsemide 100 qd to start while continuing to replete K+)--->will require titration based on sxs (dyspnea, peripheral edema), BMP, vitals, I/Os, daily weights, and vitals (would favor more aggressive regimen if hypoxic while awake or w/ ambulation though this could also be due to obesity hypoventilation syndrome)    #hypokalemia  -in setting of recent addition of metolazone/ diuresis  -repletion w/ PO  -will need to determine maintenance dose while on diuretics (had been doing ok on torsemide 80 bid, spironolactone 50 qd, KCl 10 meq qd)  -with increase of spironolactone to 100 and torsemide to 100 bid, anticipate will need at least KCl 10 meq qd vs bid  -repeat BMP tomorrow. Once hypoK resolved, should have BMP q2-3 d with increase of diuretic regimen until stable then at least q weekly while hospitalized    #OSA  -not formally dx  -needs sleep study  -likely will require 2 liters O2 NC while asleep    #DOES NOT HAVE COPD  -she has been labeled many times as O2 dependent due to COPD   -had PFT in 08/2021 which did not show any obstruction or response to bronchodilator. Reduced FVC implied restrictive pathology though lung volumes were unable to be assessed due to claustrophobia. Also unable to assess DLCO testing.   -should have repeat PFTs      #seizure d/o  -c/w keppra, trileptal    #hypothyroidism  -tsh normal  -c/w levothyroxine    #GERD  -c/w  ppi    #chronic pain  #sciatica  #polysubstance use  -c/w robaxin, baclofen, gabapentin, suboxone, NRT    Diet- regular       The patient has been examined, and the chart has been reviewed,  with findings as documented above.  The patient is medically stable and cleared for admission.      Adriana Simas, PA-C, 04/09/2022

## 2022-04-09 NOTE — Plan of Care (Signed)
Patient is 51 years old female who previously admitted on [facility] for SA on 03/24/22; on 04/08/22, she transferred to [facility] one medical floor for elevated D-Dimer. Patient was treated and returned to [facility] 2 on 04/09/22 to continue her psych treatment.      Patient arrived on [facility] 2 around 1150 from [place] [facility], alert and oriented X 4, on oxygen therapy for SOB, VSS as recorded, denies pain, discomfort, Covid 19 SX, SI/HI/AVH, bright affect, jovial with staff, she signed the CV, adhered with the admission process, B/B searched, noted redness under abdominal folds and groin areas, belongings are in the patient's sharp closet, her valuables that were previously in envelope # [id number] in the safe remains in the safe for safety. Patient ambulates with rollator walker and aware of her surrounding, Edmondson fall score 74.1, patient admitted in room 208A in placed in 5 minute safety checks

## 2022-04-09 NOTE — PES NOTES (Signed)
Spoke with Erin Sons at [facility] to update authorization start date to 04/09/2022.     Start date: 04/09/2022  Review date: 04/12/2022  Authorization # [id number]  Reviewer: Alessandra Bevels.  Phone: [phone]        Melina Schools, LCSW

## 2022-04-10 LAB — BASIC METABOLIC PANEL
ANION GAP: 11 mmol/L (ref 10–22)
BUN (UREA NITROGEN): 31 mg/dL — ABNORMAL HIGH (ref 7–18)
CALCIUM: 9.7 mg/dL (ref 8.5–10.5)
CARBON DIOXIDE: 38 mmol/L — ABNORMAL HIGH (ref 21–32)
CHLORIDE: 82 mmol/L — ABNORMAL LOW (ref 98–107)
CREATININE: 1.1 mg/dL (ref 0.4–1.2)
ESTIMATED GLOMERULAR FILT RATE: >60 mL/min (ref >60)
Glucose Random: 130 mg/dL (ref 74–160)
POTASSIUM: 3.2 mmol/L — ABNORMAL LOW (ref 3.5–5.1)
SODIUM: 131 mmol/L — ABNORMAL LOW (ref 136–145)

## 2022-04-10 LAB — LIPID PANEL
Cholesterol: 263 mg/dL — ABNORMAL HIGH (ref 0–239)
HIGH DENSITY LIPOPROTEIN: 40 mg/dL (ref 40–60)
LOW DENSITY LIPOPROTEIN DIRECT: 192 mg/dL — ABNORMAL HIGH (ref 0–189)
TRIGLYCERIDES: 301 mg/dL — ABNORMAL HIGH (ref 0–150)

## 2022-04-10 LAB — MAGNESIUM: MAGNESIUM: 2.2 mg/dL (ref 1.6–2.6)

## 2022-04-10 MED ORDER — DOCUSATE SODIUM 100 MG PO CAPS
100.0000 mg | ORAL_CAPSULE | Freq: Two times a day (BID) | ORAL | Status: DC
Start: 2022-04-10 — End: 2022-04-12
  Administered 2022-04-10 – 2022-04-12 (×4): 100 mg via ORAL
  Filled 2022-04-10 (×5): qty 1

## 2022-04-10 MED ORDER — TORSEMIDE 100 MG PO TABS
100.0000 mg | ORAL_TABLET | Freq: Every day | ORAL | Status: DC
Start: 2022-04-12 — End: 2022-04-12
  Filled 2022-04-10: qty 1

## 2022-04-10 MED ORDER — SPIRONOLACTONE 100 MG PO TABS
100.0000 mg | ORAL_TABLET | Freq: Every day | ORAL | Status: DC
Start: 2022-04-12 — End: 2022-04-12
  Administered 2022-04-12: 100 mg via ORAL
  Filled 2022-04-10: qty 1

## 2022-04-10 MED ORDER — LACTULOSE 10 GM/15ML PO SOLN
20.0000 g | Freq: Two times a day (BID) | ORAL | Status: DC
Start: 2022-04-10 — End: 2022-04-12
  Administered 2022-04-10 – 2022-04-12 (×4): 20 g via ORAL
  Filled 2022-04-10 (×6): qty 30

## 2022-04-10 NOTE — Progress Notes (Signed)
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     No overnight events, Medication compliance Yes, Sleep is good, Appetite is good, No significant nursing concerns.  Pt continuing to report chronic pain and working with nursing around this.      VITALS:  BP 109/71   Pulse 87   Temp 97.5 F (36.4 C) (Temporal)   Resp 17   Ht 5\' 9"  (1.753 m)   Wt (!) 149 kg (328 lb 6.4 oz)   SpO2 97%   BMI 48.50 kg/m    Pain Score: 0 (0/10)    Scheduled Meds:  . [START ON 04/12/2022] torsemide  100 mg Oral Daily   . [START ON 04/12/2022] spironolactone  100 mg Oral Daily   . docusate sodium  100 mg Oral BID   . lactulose  20 g Oral BID   . aspirin  324 mg Oral Daily   . baclofen  10 mg Oral BID   . buprenorphine-naloxone  8 mg Sublingual TID   . fluticasone  1 spray Each Nostril  Daily   . gabapentin  600 mg Oral TID   . levETIRAcetam  750 mg Oral BID   . levothyroxine  25 mcg Oral DAILY   . nicotine  1 patch Transdermal Daily   . tiotropium  2 puff Inhalation Daily   . prazosin  1 mg Oral Nightly   . QUEtiapine  100 mg Oral Nightly   . polyethylene glycol  17 g Oral Daily   . pantoprazole  40 mg Oral Daily   . methocarbamol  500 mg Oral TID   . nystatin   Topical TID   . OXcarbazepine  300 mg Oral TID   . lidocaine  1 patch Topical Q24H   . sennosides  17.2 mg Oral BID   . potassium chloride SA  40 mEq Oral BID       PRN Meds:  Current Facility-Administered Medications   Medication Dose Route Frequency Last Admin   .  albuterol HFA  2 puff Inhalation Q6H PRN     . magnesium hydroxide  30 mL Oral Daily PRN     . aluminum-magnesium hydroxide-simethicone  30 mL Oral Q2H PRN     . ibuprofen  600 mg Oral Q6H PRN 600 mg at 04/10/22 0401   . traZODone  50 mg Oral Nightly PRN     . nicotine polacrilex  4 mg Oral Q2H PRN 4 mg at 04/10/22 1558   . LORazepam  0.5 mg Oral TID PRN 0.5 mg at 04/10/22 0401   . haloperidol  10 mg Oral Q6H PRN     . diphenhydrAMINE  50 mg Oral Q6H PRN     . bisacodyl  10 mg Rectal Daily PRN     . ondansetron  4 mg Oral Q8H PRN 4 mg at 04/09/22 1803       Mental Status Examination Notable Findings:  Mental Status Exam   General Appearance: Clean;Dressed appropriately  Behavior: Cooperative  Level of Consciousness: Alert  Orientation Level: Oriented x3  Attention/Concentration: Fluctuating  Mannerisms/Movements: No abnormal mannerisms/movements  Speech Quality and Rate: Rapid;Pressured  Speech Clarity: Clear  Speech Tone: Normal vocal inflection  Vocabulary/Fund of Knowledge: WNL  Memory: Grossly intact  Thought Process & Associations: Goal-directed  Dissociative Symptoms: None  Thought Content: No abnormalities reported or observed  Hallucinations: None  Suicidal Thoughts: None  Homicidal Thoughts: None  Mood: Depressed/Sad  * Mood comment: Anxious  Affect: Congruent with  mood  Judgment: Fair  Insight: Fair      C-SSRS:  Default Flowsheet Data (most recent)     C-SSRS Frequent Screener - 04/10/22 1611        C-SSRS Frequent Screener    Since last contact, have you actually had thoughts about killing yourself ?  No     Have you been thinking about how you might do this?  No     Have you had these thoughts and had some intention of acting on them?  No     Have you started to work out or worked out the details of how to kill yourself? Do you intend to carry out this plan? No     Have you done anything, started to do anything, or prepared to do anything to end your life?  No     C-SSRS Frequent Screener Risk Score No Risk                     Allergies:  Review of Patient's Allergies indicates:   Bee venom               Anaphylaxis   Metolazone              Other (See Comments)    Comment:Hypokalemia   Methylprednisolone      Dizziness, Drowsiness    Comment:Also believes syncope. Seems to tolerate             inhalational and epidural steroid.   Nutritional supplem*       Hydroxyzine             Nausea Only      DSM-V    Primary Diagnosis: Major depressive disorder, recurrent episode, moderate   ([name])   Secondary Diagnosis: Borderline personality disorder ([name])          ASSESSMENT:   Continue treatment plan as per treatment team   -Appreciate hospitalist service management of renal function and diuretics

## 2022-04-10 NOTE — Plan of Care (Signed)
Problem: Tobacco/Nicotine Abuse  Goal: Risk control - tobacco abuse  Description: Actions to eliminate or reduce tobacco use.  Outcome: Progressing     Problem: Mood, Altered  Description: Patient was admitted to the [facility] psych unit on 03/24/22  with SI/HI  in the setting of ongoing homelessness and alcohol withdrawal, she was transferred to our medical services for elevated D-dimer. After work up, including a cardiac echo, she was medically cleared and transferred back to the [facility] psych unit to continue her psychiatric treatment.  Goal: Demonstrates/reports improvement in mood stability by discharge  Description: Patient will demonstrate a reduction of 70% in his depressive symptoms by day 7 of admission, report to staff or to the team any intent to harm self and others and function at adequate level throughout the hospitalization.  Outcome: Progressing   Patient received sleeping in dining room. She is noncompliant with oxygen but redirected with desired effect. MD note says she does not have COPD, but she can have O2 2L at night when sleeping. She denies HI/SI/AH/VH. No COVID symptoms. Consumed 100% of breakfast and lunch. Ambulated with rollater walker. Flonase and spiriva brought up to unit from pharmacy and administered. Nicorette gum requested in morning and in afternoon for cravings with good effect. She declined her nystatin powder. She complains of constipation but declined rectal dulcolax. MD ordered colace, but she declined as well. Fluids encouraged. Participated in groups and socialized with her peers. Cont on 5 min checks for safety.      BP 115/76   Pulse 76   Temp 97.3 F (36.3 C)   Resp 18   Ht 5\' 9"  (1.753 m)   Wt (!) 149 kg (328 lb 6.4 oz)   SpO2 92%   BMI 48.50 kg/m   Pain Score: 0 (0/10)

## 2022-04-10 NOTE — Event Note (Signed)
Reviewed repeat BMP today. Appears creatinine and BUN are trending upward. K+ improved but still low 3.2. Will receive KCl 40 mEq bid today. Had already received increased torsemide and increased spironolactone dose so may need a diuretic holiday tomorrow pending repeat BMP. Would caution falling behind on her diuretic regimen given had c/o poor urine output on only torsemide 80 qd w/ spironolactone 50 (and had tolerated doses of torsemide 120 bid vs tid in past well w/ normal renal function).     -adjusted diuretic so next dose will be on 7/25 to allow potential diuretic holiday pending repeat BMP tomorrow morning  -may need additional KCl tomorrow which would need to be ordered  -regarding bowel regimen, appears has still not had BM despite addition of lactulose qd, continuing miralax qd, and increase of senna to 17 bid; declined suppository. Will increase lactulose to bid. Could consider enema tomorrow if feeling constipated and still no BM.

## 2022-04-10 NOTE — Plan of Care (Signed)
97.5 87 17 109/71 02 SAT 97 % on 2 L NC. No noted Covid sx. No voiced SI/HI,AH/VH. Received Lactulose 30 ml for c/o constipation. Spent time in group room watching a movie. Ate 100 % of dinner. Pt. Noted pleasant and social with select peers. Compliant with HS medications. Received Motrin 600 mg po for c/o bilateral knee pain a "8" on pain scale at 1957. Received Ativan 0.5 mg po at 1957 for c/o anxiety. No behavior or safety issues noted. Will monitor.    Results from lactulose are pending. Will monitor.

## 2022-04-10 NOTE — Plan of Care (Signed)
Problem: Mood, Altered  Description: Patient was admitted to the [facility] psych unit on 03/24/22  with SI/HI  in the setting of ongoing homelessness and alcohol withdrawal, she was transferred to our medical services for elevated D-dimer. After work up, including a cardiac echo, she was medically cleared and transferred back to the [facility] psych unit to continue her psychiatric treatment.  Goal: Demonstrates/reports improvement in mood stability by discharge  Description: Patient will demonstrate a reduction of 70% in his depressive symptoms by day 7 of admission, report to staff or to the team any intent to harm self and others and function at adequate level throughout the hospitalization.  Outcome: Progressing   Patient received sleeping in bed at the start of shift with no apparent distress. Listened music on Ipad. Her roommate was c/o of Ipad noise which she had earlier. Came out at 1:30am, asked for lunch box, received. Pt was upset for awhile about her roommate complain which wake her up. Came out at 1:30am, asked for lunch box, received. Pt fell asleep in DR, slept for 3hrs on chair laying on her left side. Offered to go back to bed she refused.  At around 3:50am she woke up and c/o generalized pain, received PRN Ibuprofen and PRN Ativan for anxiety as per pt request with good effects. Assisted to go back to her room as needed. Compliant with med. SPO2 :99% on RA, P: 74/m. Denied pain. No safety concerns. Morning Synthroid not given as pt was sleeping, Skyelar slept 6.4hrs. Will continue monitor and maintain safety checks.

## 2022-04-11 LAB — BASIC METABOLIC PANEL
ANION GAP: 13 mmol/L (ref 10–22)
BUN (UREA NITROGEN): 35 mg/dL — ABNORMAL HIGH (ref 7–18)
CALCIUM: 9.6 mg/dL (ref 8.5–10.5)
CARBON DIOXIDE: 37 mmol/L — ABNORMAL HIGH (ref 21–32)
CHLORIDE: 87 mmol/L — ABNORMAL LOW (ref 98–107)
CREATININE: 1 mg/dL (ref 0.4–1.2)
ESTIMATED GLOMERULAR FILT RATE: >60 mL/min (ref >60)
Glucose Random: 121 mg/dL (ref 74–160)
POTASSIUM: 3.3 mmol/L — ABNORMAL LOW (ref 3.5–5.1)
SODIUM: 137 mmol/L (ref 136–145)

## 2022-04-11 LAB — EKG

## 2022-04-11 MED ORDER — POTASSIUM CHLORIDE CRYS ER 20 MEQ PO TBCR
40.00 meq | EXTENDED_RELEASE_TABLET | Freq: Two times a day (BID) | ORAL | Status: AC
Start: 2022-04-11 — End: 2022-04-12
  Administered 2022-04-11 – 2022-04-12 (×3): 40 meq via ORAL
  Filled 2022-04-11 (×3): qty 2

## 2022-04-11 MED ORDER — POTASSIUM CHLORIDE CRYS ER 20 MEQ PO TBCR
40.0000 meq | EXTENDED_RELEASE_TABLET | Freq: Once | ORAL | Status: DC
Start: 2022-04-11 — End: 2022-04-11

## 2022-04-11 MED ORDER — FLEET ENEMA 7-19 GM/118ML PR ENEM
1.00 | ENEMA | Freq: Once | RECTAL | Status: AC
Start: 2022-04-11 — End: 2022-04-11
  Administered 2022-04-11: 1 via RECTAL
  Filled 2022-04-11: qty 1

## 2022-04-11 MED ORDER — LIDOCAINE 4 % EX CREA
TOPICAL_CREAM | Freq: Every day | CUTANEOUS | Status: DC | PRN
Start: 2022-04-11 — End: 2022-04-12
  Filled 2022-04-11: qty 5

## 2022-04-11 NOTE — Group Note (Signed)
OT Psych Group Documentation    Topics: Feel Good      Occupations Addressed: Activities of daily living (functional mobility), Instrumental activities of daily living (health management & maintenance), Leisure, Social participation      Skills addressed: Fine/gross motor skills, social interaction skills, process skills, emotional regulation, mental functions     Date: 04/11/2022  Start Time: 1015  End Time: 1100    Attendance: Declined    Imoni Kohen, OT  lic# [id number]

## 2022-04-11 NOTE — Case Mgmt Note (Signed)
04/09/22 [name] who was on [facility] from 03/24/22 through LCD and transfer to medical unit on 04/07/22 due to c/o pain in her left shoulder and arm. She also had an elevated D-Dimer. She was worked up and once medically cleared, she was returned to [facility] today to completed her psychiatric treatment. Admitting verified her insurance as [facility] and her new authorization number is [id number]. She was authorized from 04/09/22 through next review on 04/12/22 with Victorino Dike at [phone]. SW completes the reviews with Victorino Dike and UR nurse will assist as needed.

## 2022-04-11 NOTE — Plan of Care (Signed)
Problem: Mood, Altered  Description: Patient was admitted to the [facility] psych unit on 03/24/22  with SI/HI  in the setting of ongoing homelessness and alcohol withdrawal, she was transferred to our medical services for elevated D-dimer. After work up, including a cardiac echo, she was medically cleared and transferred back to the [place] psych unit to continue her psychiatric treatment.  Goal: Demonstrates/reports improvement in mood stability by discharge  Description: Patient will demonstrate a reduction of 70% in his depressive symptoms by day 7 of admission, report to staff or to the team any intent to harm self and others and function at adequate level throughout the hospitalization.  Outcome: Progressing  Received patient active in the milieu. Agitated edge. Per report patient has been angry, cursing, & not compliant with medications./02 No COVID sx. Calm & cooperative upon approach. Denies pain/SI/HI/AH/VH. Participated in dinner group consuming 100%. Appetite remains excellent. Patient requested previously refused Gabapentin, Robaxin, Minipress, & Suboxone. Encouraged to take previously refused Trileptal. Patient was cooperaitve & took these medications. Requested to received 1x order of Fleets after dinner. Approximately 1800, Fleets was administered & results pending. Patient refused Lidocaine duragestic patch & requested Lidocaine cream which as been d/c. Spoke with Dr. Serina Cowper & new order for Lidocaine 4% cream in place. Non-compliant with 02. Sitting at rest, pt. 91% RA, denies SOB, & no apparent distress.  As of 2300, no results reported. BP 95/67   Pulse 88   Temp 98.9 F (37.2 C) (Oral)   Resp 20   Ht 5\' 9"  (1.753 m)   Wt (!) 149 kg (328 lb 6.4 oz)   SpO2 91%   BMI 48.50 kg/m

## 2022-04-11 NOTE — Group Note (Signed)
OT Psych Group Documentation    Topics: Breakfast Bites      Occupations Addressed: Activities of Daily living, Social participation, Instrumental activities of daily living (health management & maintenance)     Skills addressed: Fine/gross motor skills, process skills, social interaction skills, behavioral control, mental functions (cognitive, perceptual, affective)          Date: 04/11/2022  Start Time: 0800  End Time: 0845    Attendance: Attended entire group  Participation/Patient Response: Asked questions related to topic, Easily distracted, Fell asleep, Interactive with others, Intermittent attention, Intrusive/disruptive behavior and Responsive to cues/limits/redirection from staff  Participation within a task: Demonstrated ability to make decisions, Difficulty with frustration tolerance, Independent initiation of stating needs/getting supplies/completing task, Needed assistance with problem solving, Open to assistance when offered and Responsive to cues/limits/redirection from staff  Treatment Goals Addressed: Development/maintenance of a health lifestyle and Diagnosis/illness/symptom management    Comments: Cheryl Zimmerman presented with irritable affect, often observed to be falling asleep at DR table. Independent and intermittently attentive with meal; requested assistance with menu task, required minA to complete menu task, able to make decisions re: menu options. Difficulty with frustration tolerance re: a nearby peer's lability, able to be redirected with mod cueing.     Horton Marshall, OT/s  lic# No licensure found in state: 

## 2022-04-11 NOTE — Progress Notes (Signed)
PSYCH PROGRESS NOTE    Identifying Data:  Cheryl Zimmerman a 50 year oldwoman,previously residing in [name] group home but now homeless and living on the street,diagnostic psychiatric history ofPTSD, schizoaffective disorder bipolar type, polysubstance use (alcohol and cocaine currently and opiates,multipleprior psychiatric hospitalizations starting in childhood(most recent [name]9 December 2021),multiple prior suicide attempts and SIB requiring medical attention but not recenytly, medical history significant forHFpEF with exacerbation in May 2023, severe head trama and seizure d/oon Keppra,loss of son to murder 1.5 years ago, initially presented with SIwith plan to shoot selfand HI towards "everyone" in setting of being overwhelmed around grieving her son, ongoing homelessness, and alcohol withdrawal, her mood improved in the unit and she was ready for discharge, waiting for placement but developed shortness of breath, heaviness and left chest, the D-dimer was increased and the patient was transferred to medical floor where she was stabilized and returned back to the geriatric unit.     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  From the nursing report: Patient is in good behavioral control, calm and cooperative, pleasant, social with selective peers.  Good appetite, compliant with medications.  Received Motrin 600 mg 4 bilateral pain at 1957, received Ativan Half a milligram for anxiety.  No behavioral issues.  Took lactulose but complains of no bowel movement for 2 weeks.  From the chart she had last bowel movement on July 18.  During the third shift she initially was sleeping in the dining area, then listening to music on iPad.  Went back to bed.  Was sitting on the bed nodding off and the staff was trying to adjust her in bed with concerns of a fall, the patient became upset, started yelling and screaming calling staff names, was disrespectful, difficult to redirect, with frequent racial slurs.  Slept 5.8 hours.  Safe on checks.  In the morning complained of shortness of breath, saturation 82, was started on oxygen 2 L with resulting saturations of 96%.  Upset of staff not believing her when she is asking for inhaler, and complaining of shortness of breath, irritable, angry edge.  Received Dulcolax suppository x1 with affect pending  The patient was seen during morning rounds together with the social worker.  She said that she is not feeling well, complained of high anxiety where she just took a dose of lorazepam, stated that she feels disrespected, complained of shortness of breath and pain through her body.  She was informed of the lab results and necessity to take potassium, agreed.  We spoke about waiting for results of suppository and if she does not have a BM, doing an enema.  The patient also agreed.  She presented  dressed in PG, sitting on the bed, was on oxygen supplementation, angry looking, but in control, has swollen ankles..  She was upset, asking about placement and stated if there will be no beds till the end of the weeks, she wants to be dc.[name].  Became very disappointed when was told that there are no available beds in shelters.  They have not informed the patient that she was denied from foster agency.  The patient is pretty entitled and despite [facility] and relapsing and escaping every time feels that she has a right to make a mistake and still has to be placed.  The team has been making multiple referrals and unfortunately because of combination of substance use, suicidal attempts, oxygen needs and disability status her placement was very challenging.  The patient was seen by hospitalist Dr. Revonda HumphreyPuckett yesterday in the afternoon and discussed today with Dr. Reather LaurenceLimouze, her creatinine and BUN increased a little bit, potassium is trending up but still was 3.2 yesterday.  She received potassium chloride 40 mEq twice yesterday and potassium today is 3.3, which limits further increase in the dose of torsemide.  She has been taking 100 of spironolactone and 100 of torsemide once a day till 04/10/22, today she is taking one day off in her diuretic regimen. We will repeat her BMP tomorrow and restart diuretics.     Allergies:  Review of Patient's Allergies indicates:   Bee venom               Anaphylaxis   Metolazone              Other (See Comments)    Comment:Hypokalemia   Methylprednisolone      Dizziness, Drowsiness    Comment:Also believes syncope. Seems to tolerate             inhalational and epidural steroid.   Nutritional supplem*       Hydroxyzine             Nausea Only    Scheduled Meds:  Current Facility-Administered Medications   Medication Dose Route Frequency Last Admin   . potassium chloride SA (K-DUR) CR tablet 40 mEq  40 mEq Oral BID 40 mEq at 04/11/22 1026   . [START ON  04/12/2022] torsemide (DEMADEX) tablet 100 mg  100 mg Oral Daily     . [START ON 04/12/2022] spironolactone (ALDACTONE) tablet 100 mg  100 mg Oral Daily     . docusate sodium (COLACE) capsule 100 mg  100 mg Oral BID 100 mg at 04/11/22 0830   . lactulose (CHRONULAC) 20 GM/30 ML solution 20 g  20 g Oral BID 20 g at 04/11/22 0830   . aspirin chewable tablet 324 mg  324 mg Oral Daily 324 mg at 04/11/22 0830   . baclofen (LIORESAL) tablet 10 mg  10 mg Oral BID 10 mg at 04/11/22 0829   .  buprenorphine-naloxone (SUBOXONE) 8-2 MG SL tablet 1 tablet  8 mg Sublingual TID 1 tablet at 04/11/22 0830   . fluticasone (FLONASE) 50 MCG/ACT nasal spray 1 spray  1 spray Each Nostril Daily 1 spray at 04/11/22 0836   . gabapentin (NEURONTIN) capsule 600 mg  600 mg Oral TID 600 mg at 04/11/22 0830   . levETIRAcetam (KEPPRA) tablet 750 mg  750 mg Oral BID 750 mg at 04/11/22 0830   . levothyroxine (SYNTHROID) tablet 25 mcg  25 mcg Oral DAILY 25 mcg at 04/11/22 0537   . nicotine (NICODERM CQ) 21 MG/24HR 1 patch  1 patch Transdermal Daily 1 patch at 04/11/22 0839   . tiotropium (SPIRIVA RESPIMAT) inhaler 2 puff  2 puff Inhalation Daily 2 puff at 04/11/22 0836   . prazosin (MINIPRESS) capsule 1 mg  1 mg Oral Nightly 1 mg at 04/10/22 1954   . QUEtiapine (SEROquel) tablet 100 mg  100 mg Oral Nightly 100 mg at 04/10/22 1957   . polyethylene glycol (GLYCOLAX/MIRALAX) packet 17 g  17 g Oral Daily 17 g at 04/11/22 0830   . pantoprazole (PROTONIX) EC tablet 40 mg  40 mg Oral Daily 40 mg at 04/11/22 0830   . methocarbamol (ROBAXIN) tablet 500 mg  500 mg Oral TID 500 mg at 04/11/22 0830   . nystatin (MYCOSTATIN) powder   Topical TID Given at 04/11/22 0836   . OXcarbazepine (TRILEPTAL) tablet 300 mg  300 mg Oral TID 300 mg at 04/11/22 0830   . lidocaine (SALONPAS) 4 % patch 1 patch  1 patch Topical Q24H     . sennosides (SENOKOT) tablet 17.2 mg  17.2 mg Oral BID 17.2 mg at 04/11/22 0830         PRN Meds:  Current Facility-Administered Medications    Medication Dose Route Frequency Last Admin   . albuterol HFA  2 puff Inhalation Q6H PRN 2 puff at 04/11/22 1053   . magnesium hydroxide  30 mL Oral Daily PRN     . aluminum-magnesium hydroxide-simethicone  30 mL Oral Q2H PRN     . ibuprofen  600 mg Oral Q6H PRN 600 mg at 04/10/22 1957   . traZODone  50 mg Oral Nightly PRN     . nicotine polacrilex  4 mg Oral Q2H PRN 4 mg at 04/11/22 0906   . LORazepam  0.5 mg Oral TID PRN 0.5 mg at 04/11/22 1053   . haloperidol  10 mg Oral Q6H PRN     . diphenhydrAMINE  50 mg Oral Q6H PRN     . bisacodyl  10 mg Rectal Daily PRN 10 mg at 04/11/22 1103   . ondansetron  4 mg Oral Q8H PRN 4 mg at 04/09/22 1803         VITALS:  Patient Vitals for the past 24 hrs:   BP Temp Temp src Pulse Resp SpO2   04/11/22 1053 -- -- -- 79 -- --   04/11/22 0830 -- -- -- -- 18 --   04/11/22 0734 129/88 97.2 F (36.2 C) -- 79 -- 96 %   04/10/22 1955 -- -- -- -- 20 --   04/10/22 1954 111/76 -- -- -- -- --   04/10/22 1552 109/71 97.5 F (36.4 C) TEMPORAL 87 17 97 %       MENTAL STATUS EXAM:   Mental Status Exam   General Appearance: Clean;Dressed appropriately  Behavior: Cooperative  Level of Consciousness: Alert  Orientation Level: Oriented x3  Attention/Concentration: Fluctuating  Mannerisms/Movements: No abnormal mannerisms/movements  Speech Quality and Rate: Rapid;Pressured  Speech Clarity: Clear  Speech Tone: Normal vocal inflection  Vocabulary/Fund of Knowledge: WNL  Memory: Grossly intact  Thought Process & Associations: Goal-directed  Dissociative Symptoms: None  Thought Content: No abnormalities reported or observed  Hallucinations: None  Suicidal Thoughts: None  Homicidal Thoughts: None  Mood: Depressed/Sad  * Mood comment: Anxious  Affect: Congruent with mood  Judgment: Fair  Insight: Fair    C-SSRS:  Default Flowsheet Data (most recent)     C-SSRS Frequent Screener - 04/11/22 1000        C-SSRS Frequent Screener    Since last contact, have you actually had thoughts about killing yourself ?   No     Have you been thinking about how you might do this?  No     Have you had these thoughts and had some intention of acting on them?  No     Have you started to work out or worked out the details of how to kill yourself? Do you intend to carry out this plan? No     Have you done anything, started to do anything, or prepared to do anything to end your life?  No     C-SSRS Frequent Screener Risk Score No Risk                 Default Flowsheet Data (most recent)     C-SSRS Risk Assessment - 03/25/22 1245        C-SSRS Risk Assessment    Suicidal and Self-Injurious Behavior (Past 3 Months) --   denies    Suicidal and Self-Injurious Behavior (Lifetime) --   denies    Suicidal Ideation Check Most Severe in Past Month --   denies    Activating Events (Recent) Current or pending isolation or feeling alone     Treatment History Previous psychiatric diagnoses and treatments;Hopeless or dissatisfied with treatment;Non-compliant with treatment     Protective Factors (Recent) Identifies reasons for living;Fear of death or dying due to pain and suffering     Any suicidal, self-injurious, or aggressive behaviors?  Yes     Describe any suicidal, self-injurious or aggressive behavior (INCLUDE DATES) had a serious suicidal attempt after her son was mordered 15 years ago, some cutting behavior after that                 Physical/Somatic Complaints  The patient lists: headaches          DSM-V    Primary Diagnosis: Major depressive disorder, recurrent episode, moderate   ([name])   Secondary Diagnosis: Borderline personality disorder ([name])   Diagnosis: PTSD (post-traumatic stress disorder)         Informed Consent for Antipsychotics - Sun March 27, 2022     Row Name [id number]       Informed Consent for Antipsychotics    Haloperidol (Haldol) Patient/HCP agrees with treatment plan  -GV at 03/27/22 1931          User Key  (r) = Recorded By, (t) = Taken By, (c) = Cosigned By    Initials Name Provider Type Discipline    Lutricia Horsfall Earlean Shawl, MD  Physician Psychiatrist                Assessment  [facility] PAQUINis a 50 [facility] oldwoman,previously residing in [name] but now homeless and living on the street,diagnostic psychiatric history ofPTSD, schizoaffective disorder bipolar type, polysubstance use (alcohol and cocaine  currently and opiates,multipleprior psychiatric hospitalizations starting in childhood(most recent [name]9 December 2021),multiple prior suicide attempts and SIB requiring medical attention but not recenytly, medical history significant forHFpEF with exacerbation in May 2023, severe head trama and seizure d/oon Keppra,loss of son to murder 1.5 years ago, initially presented with SIwith plan to shoot selfand HI towards "everyone" in setting of being overwhelmed around grieving her son, ongoing homelessness, and alcohol withdrawal, her mood improved in the unit and she was ready for discharge, waiting for placement but developed shortness of breath, heaviness and left chest, the D-dimer was increased and the patient was transferred to medical floor where she was stabilized and returned back to the geriatric unit.  Plan  Regular diet  Continue preadmission medications:    CHF:  Cont. Potassium Chloride 40 mEq daily  Restart Torsemide 80 mg po BID tomorrow on 04/12/22  Spironolactone 100 mg po daily restart tomorrow on 04/12/22  Stopped Metolazone 2.5 mg po daily with biweekly BMP checks      CAD:  Aspirin 324 mg po daily    Fungal rash:  Nystatin powder apply twice a day on abdominal folds    Mood:  Trileptal 300 mg po TID  Seroquel 100 mg po nightly  Cont. Haldol 10 mg, Benadryl 50 mg every 6 hours as needed for agitation.    Anxiety:  Cont. Lorazepam 0.5 mg po TID prn    COPD:  Cont. Albuterol inhaler 2 puffs every 6 hs as needed.  Spiriva inhaler 2 puffs daily    Pain:  Continue Suboxone dose to 24 mg a day, consider lowering the dose  Robaxin 500 mg po TID  Baclofen 10 mg po BID  Gabapentin 600 mg po TID  Cont. Lidocaine  patch 4% once day    Nightmares:  Cont. Prazosin 1 mg po nightly    Insomnia:  Cont. Trazodone 50 mg po nightly prn    Seizure disorder  Keppra 750 mg po BID    GERD:  Cont. Protonix 40 mg po daily    Smoking cessation  Cont. Nicorette patch 21 mg daily and Nicotine gum 2 mg q1h as needed.    Encourage participation in groups, sensory integration  Social work evaluation aftercare planning      Review with patient: Treatment plan reviewed with the patient.

## 2022-04-11 NOTE — Plan of Care (Addendum)
Patient angry, alert. States "I'm upset I can't leave. I don't have a bed yet to leave. I can't use my tablet or feel comfortable because of my fucking roommate. I can't wait to get the fuck out of here." Accusatory towards staff. Using profound profanity. States "I'm not happy with not having my own room." De escalation provided. She is noncompliant with oxygen but redirected with desired effect. Denies HI/SI/AH/VH. No COVID symptoms. Received shower during this shift. Declined groups but was offered the following alternatives: utilizing tablet, deep breathing, talking with staff, reading books/magazines.  Consumed 100% of breakfast and 100% of lunch. Ambulated with a rolling walker.  Received PRN dulcolax rectal pending results. New MD order for fleet enema. Pt refused fleet enema. Pt also refused 1400 scheduled medications but requested and received PRN nicorette gum. New MD order for potassium PO BID.  Fluids encouraged. Pt currently utilizing tablet in the day room. Safety checks maintained. Will continue to assess and monitor.

## 2022-04-11 NOTE — Initial Assessments (Addendum)
Occupational Therapy Initial Evaluation  S: "I don't have a list of coping skills that I go through one at a time to see what works. I'm not a child!"  O: Cheryl Zimmerman is a 51 y.o. F, initially BIBA on 03/23/22 from home with c/o SI/HI and alcohol withdrawal sx. Chronic SI following son's death a year and a half ago per pt report. Admitted to this [facility] on 03/25/22; transferred from this [facility] to medical floor for elevated d-dimer and troponin on 04/07/22. Cheryl Zimmerman returns to this [facility], re-admitted on 04/09/22 awaiting respite placement, but so far there are no beds available. PMH: borderline personality disorder, MDD, hx of SIB with previous SA, schizoaffective disorder bipolar type, PTSD, polysubstance use, CHF, COPD. PLOF: previously lived in group home, but is now homeless; independent with ADL and ambulates with use of rollator, historically requires assistance with IADL specifically financial mgt and home mgt.      04/11/22 1400   Language Information   Language of Care English   Rehab Discipline   Rehab Discipline Psych OT   Services prior to admission?   Type of Home Care Services None   Premorbid Mobility   Transfers Independent   Walking Independent;Used assistive device;Household distances only   Walking assistive devices used Rollator   ADL / IADL Baseline Status   ADL/IADL Baseline Status Yes   ADL's/IADL's   Dressing Independent   Bathing Independent  (spongebathe only)   Surveyor, quantity  (per pt report)   Bladder Continent   Bowel Continent   Premorbid Sensory   Hearing Normal   Premorbid Cognition   Cognition Intact   Living Situation   Living Setting Homeless/transient   Lives With Alone   Auditory Comprehension   Conversation Complex   Perception   Inattention/Neglect Appears intact   Initiation Appears intact   Motor Planning Appears intact   Perseveration Not present   IADL / ADL   IADL/ADL No   Daily Functioning   Coping Skills Being alone;Creative  arts;Identifies/engages in unhealthy coping skills;Going to a quiet place;Journaling;Medication;Reading;Sleeping;Watching TV/movies       ;Using the computer/playing video games  (using tablet)   Interpersonal Agitated;Angry;Engagable upon approach;Guarded;Irritable;Isolative   Leisure/Interests Creative arts     ;Cooking;Lacks positive social supports/resources;Reading;Watching TV/movies;Writing    Responsibilities/Structures Has no current day structure   Strengths Artistic/creative;Asks for help/support     ;Goal directed   Sensory Preferences   Sensory Preferences Auditory;Olfactory;Tactile   Auditory listen to music, sound of rain, thunderstorm, Leisure centre manager Sensitivities Auditory   Auditory loud yelling; music   Cognition   Attention [facility]   Decision Making Impaired   Frustration Tolerance Impaired   Memory WFL   Problem Solving X   Processing Skills Impaired   Judgement X   Insight Fair   Impulse Control Impaired   Orientation Level UTA   Task Initiation UTA   Task Completion UTA   Flexibility of Thought Impaired   Planning Skills Impaired   Organizational Skills Impaired   Processing [facility]   Perseveration Not present   Working Memory UTA   Emotional Regulation Impaired   Coping/Life Skills Impaired   Risk Areas   Risk Areas Aggressive behavior;Assaultive behavior;Death;Losses ;History of hospitalization;Homicidality;Isolation;Lack of support(s);Self-injurious behaviors;Substance abuse;Seclusion;Suicidality;Threatening behavior       ;Trauma history (physical/emotional/sexual)        A: Cheryl Zimmerman is rec'd in the  hallway, somnolent and with an irritable edge. Cheryl Zimmerman is amenable to interview with this writer to discuss progress toward OT goals and any fxl changes. When asked how she has been doing since admission from the medical floor, Cheryl Zimmerman reports that she is feeling upset with her roommate, making comments re: "punch[ing] her in  the face" because she feels that she is annoying (nsg notified). Cheryl Zimmerman is able to be redirected, but remained in an agitated state throughout the conversation. When reoriented to the role of OT and asked if she has engaged in any coping skills (i.e., using a stuffed animal, coloring, writing, reading) on the medical unit as previously discussed, Cheryl Zimmerman becomes increasingly agitated, stating "I don't have a list of coping skills that I go through one at a time to see what works. I'm not a child!". More information will be gathered throughout Cheryl Zimmerman's admission.   Cheryl Zimmerman is reoriented to role of OT, daily group schedule, and sensory cart. She will continue to benefit from skilled Psych OT services to promote day structure and socialization in order to address deficits in coping skills and symptom mgmt. Cheryl Zimmerman would also benefit from group and individual treatment with an emphasis on symptom management, sensory based treatment, improving safety, stress/anger management, communication skills/self-expression/self-advocacy, self-awareness/self-worth, functional goal setting, emotional regulation, identifying self-soothing/grounding/coping skills, exploration of warning signs/triggers that lead to hospitalization, and exploration of engagement in meaningful occupations in order to promote functional independence in daily activities/occupations in both home and community settings.   OT GOALS:  STG: Pt will improve social skills and independent use of coping skills by attending three groups per week (M-F) by 04/25/22.  LTG: Pt will report three personal strengths to indicate increased self-image and reduce depressive sx by 05/09/22.  P: Engage pt in OT POC to progress towards fxl OT goals.  . Pt should be encouraged to participate in all groups and milieu activities. OT to evaluate need for alternative treatment if appropriate.   . Pt to receive orientation, psycho-education, and practice with sensory tool and items  available on the unit to enhance opportunities to develop self-regulation strategies.   . OT to continue to assess pt's fxl, sensory, self-regulation, social, and cognitive skills, and to determine strengths and limitations in relation to pt's participation in valued occupations, and in maintaining safety.      Horton Marshall, OT/s, Lic # No licensure found in state:

## 2022-04-11 NOTE — Plan of Care (Signed)
Problem: Tobacco/Nicotine Abuse  Goal: Risk control - tobacco abuse  Description: Actions to eliminate or reduce tobacco use.  Outcome: Progressing     Patient received in dining room sleeping at the start of shift with no apparent distress. Listened music on Ipad. Offered and assisted to go back to bed. No c/O pain. At around 5;73m pt was sitting at the edge of bed falling asleep leaning forward so when the staffs request to lay back on the bed she was yelling, calling names, disrespectful, difficult to redirect. Compliant with med. SPO2 :92% on RA, P: 84/m. Denied pain. PRN Nicorette gum provided as per pt request. Slept for 5.8hrs. Will continue monitor and maintain safety. Safety checks maintained.

## 2022-04-11 NOTE — Group Note (Signed)
OT Psych Group Documentation    Topics: Project      Occupations Addressed: Leisure, Social participation     Skills addressed: Fine/gross motor skills, process skills, social interaction skills, behavioral control, mental functions                  Date: 04/11/2022  Start Time: 1100  End Time: 1145    Attendance: Declined      Josalin Carneiro, OT/s  lic# No licensure found in state: [place]

## 2022-04-12 ENCOUNTER — Encounter (HOSPITAL_BASED_OUTPATIENT_CLINIC_OR_DEPARTMENT_OTHER): Payer: Self-pay

## 2022-04-12 MED ORDER — SENNOSIDES 8.6 MG PO TABS
17.20 mg | ORAL_TABLET | Freq: Two times a day (BID) | ORAL | 0 refills | Status: AC
Start: 2022-04-12 — End: 2022-05-12

## 2022-04-12 MED ORDER — LIDOCAINE 4 % EX CREA
TOPICAL_CREAM | Freq: Every day | CUTANEOUS | 0 refills | Status: AC | PRN
Start: 2022-04-12 — End: 2022-05-12

## 2022-04-12 MED ORDER — PANTOPRAZOLE SODIUM 40 MG PO TBEC
40.00 mg | DELAYED_RELEASE_TABLET | Freq: Every day | ORAL | 0 refills | Status: AC
Start: 2022-04-13 — End: 2022-05-13

## 2022-04-12 MED ORDER — LEVETIRACETAM 750 MG PO TABS
750.0000 mg | ORAL_TABLET | Freq: Two times a day (BID) | ORAL | 0 refills | Status: DC
Start: 2022-04-12 — End: 2022-10-28

## 2022-04-12 MED ORDER — TORSEMIDE 100 MG PO TABS
100.0000 mg | ORAL_TABLET | Freq: Every day | ORAL | 0 refills | Status: DC
Start: 2022-04-12 — End: 2022-10-28

## 2022-04-12 MED ORDER — POLYETHYLENE GLYCOL 3350 17 G PO PACK
17.00 g | PACK | Freq: Every day | ORAL | 0 refills | Status: AC
Start: 2022-04-13 — End: 2022-05-13

## 2022-04-12 MED ORDER — BUPRENORPHINE HCL-NALOXONE HCL 8-2 MG SL SUBL
1.0000 | SUBLINGUAL_TABLET | Freq: Three times a day (TID) | SUBLINGUAL | 0 refills | Status: DC
Start: 2022-04-12 — End: 2022-09-30

## 2022-04-12 MED ORDER — PRAZOSIN HCL 1 MG PO CAPS
1.0000 mg | ORAL_CAPSULE | Freq: Every evening | ORAL | 0 refills | Status: DC
Start: 2022-04-12 — End: 2022-07-19

## 2022-04-12 MED ORDER — ALBUTEROL SULFATE HFA 108 (90 BASE) MCG/ACT IN AERS
2.0000 | INHALATION_SPRAY | Freq: Four times a day (QID) | RESPIRATORY_TRACT | 0 refills | Status: DC | PRN
Start: 2022-04-12 — End: 2022-10-28

## 2022-04-12 MED ORDER — POTASSIUM CHLORIDE CRYS ER 20 MEQ PO TBCR
40.00 meq | EXTENDED_RELEASE_TABLET | Freq: Every day | ORAL | 0 refills | Status: AC
Start: 2022-04-12 — End: 2022-04-19

## 2022-04-12 MED ORDER — ASPIRIN 81 MG PO CHEW
324.00 mg | CHEWABLE_TABLET | Freq: Every day | ORAL | 0 refills | Status: AC
Start: 2022-04-13 — End: 2022-05-13

## 2022-04-12 MED ORDER — METHOCARBAMOL 500 MG PO TABS
500.00 mg | ORAL_TABLET | Freq: Three times a day (TID) | ORAL | 0 refills | Status: AC
Start: 2022-04-12 — End: 2022-05-12

## 2022-04-12 MED ORDER — OXCARBAZEPINE 150 MG PO TABS
150.0000 mg | ORAL_TABLET | Freq: Two times a day (BID) | ORAL | 0 refills | Status: DC
Start: 2022-04-12 — End: 2022-10-28

## 2022-04-12 MED ORDER — ATORVASTATIN CALCIUM 80 MG PO TABS
80.0000 mg | ORAL_TABLET | Freq: Every day | ORAL | 0 refills | Status: DC
Start: 2022-04-12 — End: 2022-10-25

## 2022-04-12 MED ORDER — QUETIAPINE FUMARATE 100 MG PO TABS
100.0000 mg | ORAL_TABLET | Freq: Every evening | ORAL | 0 refills | Status: DC
Start: 2022-04-12 — End: 2022-07-20

## 2022-04-12 MED ORDER — TRAZODONE HCL 50 MG PO TABS
50.0000 mg | ORAL_TABLET | Freq: Every evening | ORAL | 0 refills | Status: DC | PRN
Start: 2022-04-12 — End: 2022-10-28

## 2022-04-12 MED ORDER — NYSTATIN 100000 UNIT/GM EX POWD
Freq: Three times a day (TID) | CUTANEOUS | 0 refills | Status: AC
Start: 2022-04-12 — End: 2022-05-12

## 2022-04-12 MED ORDER — GABAPENTIN 300 MG PO CAPS
600.0000 mg | ORAL_CAPSULE | Freq: Three times a day (TID) | ORAL | 0 refills | Status: DC
Start: 2022-04-12 — End: 2022-12-11

## 2022-04-12 MED ORDER — SPIRONOLACTONE 100 MG PO TABS
100.0000 mg | ORAL_TABLET | Freq: Every day | ORAL | 0 refills | Status: DC
Start: 2022-04-13 — End: 2022-10-28

## 2022-04-12 MED ORDER — LEVOTHYROXINE SODIUM 25 MCG PO TABS
25.0000 ug | ORAL_TABLET | Freq: Every morning | ORAL | 0 refills | Status: DC
Start: 2022-04-12 — End: 2022-10-28

## 2022-04-12 MED ORDER — LORAZEPAM 0.5 MG PO TABS
0.50 mg | ORAL_TABLET | Freq: Three times a day (TID) | ORAL | 0 refills | Status: AC | PRN
Start: 2022-04-12 — End: 2022-04-19

## 2022-04-12 MED ORDER — TIOTROPIUM BROMIDE MONOHYDRATE 2.5 MCG/ACT IN AERS
2.0000 | INHALATION_SPRAY | Freq: Every day | RESPIRATORY_TRACT | 0 refills | Status: DC
Start: 2022-04-13 — End: 2022-10-28

## 2022-04-12 MED ORDER — BACLOFEN 10 MG PO TABS
10.00 mg | ORAL_TABLET | Freq: Two times a day (BID) | ORAL | 0 refills | Status: AC
Start: 2022-04-12 — End: 2022-05-12

## 2022-04-12 NOTE — Plan of Care (Signed)
Patient has been in good behavioral control throughout shift today. Denied SI/HI/AH/VH and no safety issues during this 7 to 3 shift. Pt has been discharged to [address] in [place] via taxi at 1515 PM. All valuables and belongings returned to the patient. ( yellow ring, yellow necklace, debit and credit cards, cell phone [employer], cell phone charger, and phone charger base, one pink duffle bag, one big black bag, 3 large brown bags). Discharge teaching done with patient and she verbalized understanding

## 2022-04-12 NOTE — Progress Notes (Signed)
Social Work Progress Note      Clinical Update:Pt was reviewed in clinical team meeting.Pt seen in TV room by Dr. Ronie Spies and SW. Pt requested to discharge today. Encouraged to give the team more time to find placement, but remained set on discharge. Options for skilled nursing facility reviewed, pt not interested at this time. She feels a SNF would too greatly limit her freedom, even if smoking cigarettes was allowed. Pt assisted in getting clothing to dress, and packing belongings. Pt expressed frustrations that placement was not found during her time here.     Barriers To Discharge:  -discharge today, 7/25, from [facility] at 4:00 PM      Collateral:  -post-hospital follow-up scheduled with PCP for August 2nd at 2:00 PM   -call made to [address], [place] and [place] shelters, no available beds today       Social Work Plan:  -discharge today, 7/25, from [facility] at 4:00 PM      [employer], SWI

## 2022-04-12 NOTE — Progress Notes (Incomplete)
Patient alert, awake and oriented x 4, complained of generalized pain ,rated pain a "9" on a pain scale of 1 to 10. Prn ibuprofen  and ativan given for anxiety/pain  with good effect. result pending, ate 100% of meals at breakfast and Lunch. denies Ah/Vh/si/Sp.  Continues on 15 mins checks.

## 2022-04-12 NOTE — Group Note (Signed)
OT Psych Group Documentation    Topics: Project      Occupations Addressed: Leisure, Social participation     Skills addressed: Fine/gross motor skills, process skills, social interaction skills, behavioral control, mental functions                   Date: 04/12/2022  Start Time: 1100  End Time: 1145    Attendance: Declined      Troy Kanouse, OT/s  lic# No licensure found in state: [place]

## 2022-04-12 NOTE — Plan of Care (Signed)
Problem: Mood, Altered  Description: Patient was admitted to the [facility] psych unit on 03/24/22  with SI/HI  in the setting of ongoing homelessness and alcohol withdrawal, she was transferred to our medical services for elevated D-dimer. After work up, including a cardiac echo, she was medically cleared and transferred back to the [facility] psych unit to continue her psychiatric treatment.  Goal: Demonstrates/reports improvement in mood stability by discharge  Description: Patient will demonstrate a reduction of 70% in his depressive symptoms by day 7 of admission, report to staff or to the team any intent to harm self and others and function at adequate level throughout the hospitalization.  04/12/2022 0639 by Monte Fantasia, RN  Outcome: Progressing  Received resting peacefully in bed. No COVID sx. No indications of pain/SI/HI/AH/VH. 5 minute safety checks. Presented to Nurse's station requesting ice cream snack & to inform writer that room was hot. Writer contacted [facility] & left voicemail requesting to adjust thermostat. Inconsistent r/t oxygen use. Sat 91% RA. Denies SOB & no apparent distress.  No behavioral or safety concerns. Nicotine gum @ 0610. Sleep hours: 5.1. LBM undetermined. No results reported from 7/24 1800 Fleets.

## 2022-04-12 NOTE — Group Note (Signed)
OT Psych Group Documentation    Topics: Breakfast Bites      Occupations Addressed: Activities of Daily living, Social participation, Instrumental activities of daily living (health management & maintenance)     Skills addressed: Fine/gross motor skills, process skills, social interaction skills, behavioral control, mental functions (cognitive, perceptual, affective)    Date: 04/12/2022  Start Time: 0800  End Time: 0845    Attendance: Attended entire group  Participation/Patient Response: Asked questions related to topic, Easily distracted, Fell asleep, Interactive with others, Intermittent attention, Intrusive/disruptive behavior and Responsive to cues/limits/redirection from staff  Participation within a task: Demonstrated ability to make decisions, Difficulty with frustration tolerance, Independent initiation of stating needs/getting supplies/completing task, Needed assistance with problem solving, Open to assistance when offered and Responsive to cues/limits/redirection from staff  Treatment Goals Addressed: Development/maintenance of a health lifestyle and Diagnosis/illness/symptom management    Comments: Irritable edge. Independent and intermittently attentive with meal; requested assistance with menu task, required minA to complete menu task, able to make decisions re: menu options, however irritable re: choices. Difficulty with frustration tolerance re: a nearby peer's questions to staff, able to be redirected with mod cueing.    Darcel Bayley, OT  lic# [id number]

## 2022-04-12 NOTE — Group Note (Signed)
OT Psych Group Documentation    Topics: Feel Good      Occupations Addressed: Activities of daily living (functional mobility), Instrumental activities of daily living (health management & maintenance), Leisure, Social participation      Skills addressed: Fine/gross motor skills, social interaction skills, process skills, emotional regulation, mental functions     Date: 04/12/2022  Start Time: 1015  End Time: 1100    Attendance: Attended entire group  Participation/Patient Response: Attentive/able to focus, Contributed to discussion, Mood/affect congruent with topic, Observed/listened and Shared about self  Participation within a task: Declined task/chose to observe  Treatment Goals Addressed: Increased coping skills    Comments: Social with select peer and OT staff, declined active components of groups. Some frustration tolerance issues noted re: accusatory of nursing staff of drinking all of the cold water in the hospital. Difficulty to redirect.     Darcel Bayley, OT  lic# [id number]

## 2022-04-12 NOTE — Discharge Summary (Signed)
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 tablet by mouth every morning before breakfast     OXcarbazepine 150 MG tablet  Dose: 150 mg  Quantity: 60 tablet  Refills: 0  common name: TRILEPTAL   Take 1 tablet by mouth in the morning and 1 tablet before bedtime.        STOP taking these medications    budesonide-formoterol 160-4.5 MCG/ACT inhaler  common name: SYMBICORT     FLUoxetine 20 MG capsule  common name: PROzac     loxapine 25 MG capsule  common name: LOXITANE     OTHER MEDICATION     OTHER MEDICATION           Where to Get Your Medications      These medications [facility] sent to [id number] - [place], [place] - [address]  [address], [place] [address]    Hours: Mon-Sat 9:00Am-9:00Pm, Sun 9:00Am-6:00Pm Phone: [phone]    albuterol HFA 108 (90 Base) MCG/ACT inhaler   aspirin 81 MG chewable tablet   atorvastatin 80 MG tablet   baclofen 10 MG tablet   buprenorphine-naloxone 8-2 MG sublingual tablet   gabapentin 300 MG capsule   levETIRAcetam 750 MG tablet   levothyroxine 25 MCG tablet   lidocaine 4 % cream   LORazepam 0.5 MG tablet   methocarbamol 500 MG tablet   nystatin powder   OXcarbazepine 150 MG tablet   pantoprazole 40 MG tablet   polyethylene glycol 17 g packet   potassium chloride SA 20 MEQ tablet   prazosin 1 MG capsule   QUEtiapine 100 MG tablet   sennosides 8.6 MG tablet   spironolactone 100 MG tablet   tiotropium 2.5 MCG/ACT aerosol solution   torsemide 100 MG tablet   traZODone 50 MG tablet            Current number of prescribed, standing, antipsychotic meds:  We discussed risks associated with antipsychotics, including extrapyramidal symptoms (EPS), weight gain, metabolic syndrome, cardiac problems, and tardive dyskinesia (TD).  We discussed the nature of EPS (stiffness, tremor, restlessness) and strategies for managing them, should they appear. Patient/Guardian understands that we will need to regularly monitor for weight gain and signs of metabolic disruption (increased serum cholesterol, glucose, and/or triglycerides) and take steps to prevent them because these effects can lead to diabetes and its associated health risks. Patient/Gardian understands black box warning.  Patient/Guardian also understands that we will need to regularly monitor for evidence of TD, and that TD symptoms, once they appear, may be irreversible.          Discharge Activity: activity as tolerated    Discharge Diet: regular diet    "Hand-Off" Transition Issues:     Disposition/Living Situation:  Anticipated Discharge Plan  Expected Discharge Date: 04/12/22  Lives With: Other (Comment) (Homeless)  Anticipated Outcome: Homeless  Transition Record: The patient/guardian consented to have discharge documentation sent to the next level of care provider: Yes  Transition Record: Discharge documentation was sent to: PCP  Provider name and type (MD, SW, etc.): [name], MD  Date of communication: 04/12/22  Time of communication: 0500  Method of communication: Fax  Discharge documentation also sent to: Other provider 1  Other Provider Info (1): [facility], [phone]  Transportation at Discharge: Taxi  Patient expects to be discharged to:: Street  Address[facility] [address], [place], [place] [address]  Contact Number After Discharge: [phone]  Best time of day to contact: Anytime  Nurse / Staff  Has Permission to Speak With:: N/A  Relationship to Patient: Self  Interpreter Requested: No  Home Care  Services: No  Type of Home Care Services: None  Family Notified of Disposition: Yes  Living Situation  Lives With: Other (Comment) (Homeless)  Living Setting: Homeless/transient    Discharge Instructions:  Please follow up with outpatient providers and continue all medications as prescribed.    Future appointments:  Future Appointments   Date Time Provider Department Center   04/20/2022  2:00 PM Lorretta Harp, MD [facility]       Follow-up Information     Follow up With Specialties Details Why Contact Info    [facility] Support Team  Call As needed call Woody Seller team for support [phone]        The writer spent more than 30 minutes on evaluation of the patient, review with medical team, and preparing discharge documentation.  More than 50% of the time was spent on counseling and coordination of care.

## 2022-04-13 LAB — EKG

## 2022-04-13 LAB — RESPIRATORY PANEL EXTENDED CARE EVERYWHERE: COVID-19 CARE EVERYWHERE: NEGATIVE

## 2022-04-14 NOTE — Progress Notes (Signed)
Patient was discharged to shelter system on 04-12-2022.  Her case was reviewed in treatment that morning.  Insurance remained [employer] throughout this hospital stay.  No auth or reviews were needed by UR nurse.  She did not want to appeal her DC per Medicare protocol.  IM Medicare form was sent to [facility] Records department and case was closed.

## 2022-04-15 LAB — RESPIRATORY PANEL EXTENDED CARE EVERYWHERE
ADENOVIRUS CARE EVERYWHERE: NOT DETECTED
BORDETELLA PARAPERTUSSIS CARE EVERYWHERE: NOT DETECTED
BORDETELLA PERTUSSIS CARE EVERYWHERE: NOT DETECTED
CHLAMYDIA PNEUMONIAE CARE EVERYWHERE: NOT DETECTED
CORONAVIRUS CARE EVERYWHERE: NOT DETECTED
COVID-19 CARE EVERYWHERE: NOT DETECTED
HUMAN METAPNEUMOVIRUS CARE EVERYWHERE: NOT DETECTED
INFLUENZA A  CARE EVERYWHERE: NOT DETECTED
INFLUENZA B  CARE EVERYWHERE: NOT DETECTED
MYCOPLASMA PNEUMONIAE CARE EVERYWHERE: NOT DETECTED
PARAINFLUENZA 1-4 RNA CARE EVERYWHERE: NOT DETECTED
RHINOVIRUS / ENTEROVIRUS CARE EVERYWHERE: NOT DETECTED
RSV  CARE EVERYWHERE: NOT DETECTED

## 2022-04-15 LAB — LAB EXTERNAL RESULT UNMAPPED

## 2022-04-19 ENCOUNTER — Telehealth (HOSPITAL_BASED_OUTPATIENT_CLINIC_OR_DEPARTMENT_OTHER): Payer: Self-pay

## 2022-04-19 NOTE — Telephone Encounter (Signed)
TTP 04/19/2022 Patient declines, says she is in hospital. Encouraged patient to call PCP when she is ready to quit.              Durenda Age, NCTTP  Health Educator  Tobacco Treatment Program  [facility]  [phone]  [email or web address]

## 2022-04-20 ENCOUNTER — Ambulatory Visit: Payer: No Typology Code available for payment source | Admitting: Internal Medicine

## 2022-04-20 DIAGNOSIS — Z5321 Procedure and treatment not carried out due to patient leaving prior to being seen by health care provider: Secondary | ICD-10-CM

## 2022-04-20 NOTE — Progress Notes (Signed)
Called pt for scheduled televisit.   Both calls went directly to a recording.  I was unable to leave a message as mailbox was full.

## 2022-05-04 ENCOUNTER — Other Ambulatory Visit: Payer: Self-pay

## 2022-06-02 ENCOUNTER — Ambulatory Visit: Payer: No Typology Code available for payment source | Admitting: Internal Medicine

## 2022-06-02 DIAGNOSIS — R2681 Unsteadiness on feet: Secondary | ICD-10-CM | POA: Diagnosis not present

## 2022-06-02 DIAGNOSIS — M48061 Spinal stenosis, lumbar 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 claudication: Secondary | ICD-10-CM | POA: Diagnosis not present

## 2022-06-02 MED ORDER — OTHER MEDICATION
1.00 | Freq: Every day | 0 refills | Status: AC
Start: 2022-06-02 — End: 2023-06-02

## 2022-06-02 NOTE — Progress Notes (Signed)
Cheryl Zimmerman 21 F  Televisit    "The insurance ([facility]) told me I have to call you to get a prescription"    "I'm in a nursing home  Currently at [facility] in [place]  Plans to stay there until finds permanent housing  Requests motorized wheel chair as "I can't walk"  Fax #: [phone] given to her by [employer] for wheelchair order by provider  This is her first motorized wheel chair  Has spinal stenosis of lumbar region, gait instability with h/o fall, morbid obesity   Advised there usually is a mobility device evaluation by PT to start with  Pt shares [employer] told her all they need is a prescription    Shares recently admitted for 2 mos at [facility]  Happy with the care she received and wishes to remain a patient at [facility] of Dr. Dalphine Handing      Gen: Speaking in paragraphs without audible SOB.  Alert and oriented to self, place, time and situation      A/P  (M48.061) Spinal stenosis of lumbar 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 claudication  (primary encounter diagnosis)    (R26.81) Gait instability    (E66.01) Morbid obesity ([name])  Comment: bariatric motorized wheel chair order  [facility] fax # included on script  Plan: OTHER MEDICATION

## 2022-06-07 ENCOUNTER — Telehealth (HOSPITAL_BASED_OUTPATIENT_CLINIC_OR_DEPARTMENT_OTHER): Payer: Self-pay | Admitting: Internal Medicine

## 2022-06-07 NOTE — Telephone Encounter (Signed)
RX for electric wheelchair received however we are still in need of visit notes supporting the need for this supply. Please sign Televist from 09/14 so we can submit.       Thank you

## 2022-06-09 NOTE — Telephone Encounter (Signed)
[facility] received an order for elec wheelchair. This has been forwarded to [employer] and scanned into Environmental health practitioner.     Please note DME requests can take 4 to 6 weeks to receive a decision.

## 2022-07-04 LAB — HEMOGLOBIN A1C CARE EVERYWHERE
HEMOGLOBIN A1C CARE EVERYWHERE: 6.6 % — ABNORMAL HIGH (ref 4.3–5.6)
MEAN BLOOD GLUCOSE CARE EVERYWHERE: 143 mg/dL

## 2022-07-14 NOTE — Telephone Encounter (Signed)
[facility] stating they never received electric wheelchair RX.    Order has been re-faxed over to [facility]

## 2022-07-15 ENCOUNTER — Encounter (HOSPITAL_BASED_OUTPATIENT_CLINIC_OR_DEPARTMENT_OTHER): Payer: Self-pay

## 2022-07-15 ENCOUNTER — Other Ambulatory Visit: Payer: Self-pay

## 2022-07-15 ENCOUNTER — Emergency Department (EMERGENCY_DEPARTMENT_HOSPITAL)
Admission: EM | Admit: 2022-07-15 | Discharge: 2022-07-15 | Disposition: A | Discharge status: Other Acute Inpatient Hospital | Payer: No Typology Code available for payment source | Source: Emergency Department | Attending: Emergency Medicine | Admitting: Emergency Medicine

## 2022-07-15 ENCOUNTER — Encounter (HOSPITAL_BASED_OUTPATIENT_CLINIC_OR_DEPARTMENT_OTHER): Payer: Self-pay | Admitting: Psychiatry

## 2022-07-15 ENCOUNTER — Inpatient Hospital Stay
Admission: AD | Admit: 2022-07-15 | Discharge: 2022-07-20 | DRG: 885 | Disposition: A | Discharge status: Skilled Nursing Facility | Payer: No Typology Code available for payment source | Source: Intra-hospital | Attending: Psychiatry | Admitting: Psychiatry

## 2022-07-15 DIAGNOSIS — I4891 Unspecified atrial fibrillation: Secondary | ICD-10-CM | POA: Diagnosis present

## 2022-07-15 DIAGNOSIS — I209 Angina pectoris, unspecified: Secondary | ICD-10-CM | POA: Diagnosis not present

## 2022-07-15 DIAGNOSIS — Y9289 Other specified places as the place of occurrence of the external cause: Secondary | ICD-10-CM | POA: Insufficient documentation

## 2022-07-15 DIAGNOSIS — S80812A Abrasion, left lower leg, initial encounter: Secondary | ICD-10-CM | POA: Insufficient documentation

## 2022-07-15 DIAGNOSIS — F319 Bipolar disorder, unspecified: Secondary | ICD-10-CM | POA: Insufficient documentation

## 2022-07-15 DIAGNOSIS — F250 Schizoaffective disorder, bipolar type: Secondary | ICD-10-CM | POA: Diagnosis present

## 2022-07-15 DIAGNOSIS — R441 Visual hallucinations: Secondary | ICD-10-CM

## 2022-07-15 DIAGNOSIS — R45851 Suicidal ideations: Secondary | ICD-10-CM | POA: Insufficient documentation

## 2022-07-15 DIAGNOSIS — R7303 Prediabetes: Secondary | ICD-10-CM | POA: Diagnosis present

## 2022-07-15 DIAGNOSIS — G8929 Other chronic pain: Secondary | ICD-10-CM | POA: Diagnosis present

## 2022-07-15 DIAGNOSIS — M4800 Spinal stenosis, site unspecified: Secondary | ICD-10-CM | POA: Diagnosis present

## 2022-07-15 DIAGNOSIS — E039 Hypothyroidism, unspecified: Secondary | ICD-10-CM | POA: Diagnosis present

## 2022-07-15 DIAGNOSIS — Z6841 Body Mass Index (BMI) 40.0 and over, adult: Secondary | ICD-10-CM | POA: Diagnosis not present

## 2022-07-15 DIAGNOSIS — E785 Hyperlipidemia, unspecified: Secondary | ICD-10-CM | POA: Diagnosis present

## 2022-07-15 DIAGNOSIS — G40909 Epilepsy, unspecified, not intractable, without status epilepticus: Secondary | ICD-10-CM | POA: Diagnosis present

## 2022-07-15 DIAGNOSIS — R443 Hallucinations, unspecified: Secondary | ICD-10-CM

## 2022-07-15 DIAGNOSIS — M25512 Pain in left shoulder: Secondary | ICD-10-CM | POA: Diagnosis present

## 2022-07-15 DIAGNOSIS — J449 Chronic obstructive pulmonary disease, unspecified: Secondary | ICD-10-CM | POA: Diagnosis present

## 2022-07-15 DIAGNOSIS — Z20822 Contact with and (suspected) exposure to covid-19: Secondary | ICD-10-CM

## 2022-07-15 DIAGNOSIS — K219 Gastro-esophageal reflux disease without esophagitis: Secondary | ICD-10-CM | POA: Diagnosis present

## 2022-07-15 DIAGNOSIS — I5030 Unspecified diastolic (congestive) heart failure: Secondary | ICD-10-CM | POA: Diagnosis present

## 2022-07-15 DIAGNOSIS — S50811A Abrasion of right forearm, initial encounter: Secondary | ICD-10-CM

## 2022-07-15 DIAGNOSIS — S50812A Abrasion of left forearm, initial encounter: Secondary | ICD-10-CM

## 2022-07-15 DIAGNOSIS — Z23 Encounter for immunization: Secondary | ICD-10-CM

## 2022-07-15 DIAGNOSIS — F22 Delusional disorders: Secondary | ICD-10-CM

## 2022-07-15 DIAGNOSIS — I5022 Chronic systolic (congestive) heart failure: Secondary | ICD-10-CM | POA: Insufficient documentation

## 2022-07-15 DIAGNOSIS — F603 Borderline personality disorder: Principal | ICD-10-CM

## 2022-07-15 DIAGNOSIS — T148XXA Other injury of unspecified body region, initial encounter: Secondary | ICD-10-CM

## 2022-07-15 DIAGNOSIS — X789XXA Intentional self-harm by unspecified sharp object, initial encounter: Secondary | ICD-10-CM | POA: Insufficient documentation

## 2022-07-15 DIAGNOSIS — F4310 Post-traumatic stress disorder, unspecified: Secondary | ICD-10-CM | POA: Diagnosis present

## 2022-07-15 DIAGNOSIS — I2089 Other forms of angina pectoris: Secondary | ICD-10-CM | POA: Diagnosis present

## 2022-07-15 DIAGNOSIS — F251 Schizoaffective disorder, depressive type: Principal | ICD-10-CM

## 2022-07-15 DIAGNOSIS — S80811A Abrasion, right lower leg, initial encounter: Secondary | ICD-10-CM | POA: Insufficient documentation

## 2022-07-15 DIAGNOSIS — F1990 Other psychoactive substance use, unspecified, uncomplicated: Secondary | ICD-10-CM | POA: Diagnosis not present

## 2022-07-15 DIAGNOSIS — R45850 Homicidal ideations: Secondary | ICD-10-CM | POA: Diagnosis not present

## 2022-07-15 LAB — COVID-19 INPATIENT: COVID-19 INPATIENT: NEGATIVE

## 2022-07-15 LAB — URINE DRUG SCREEN
AMPHETAMINES URINE: POSITIVE ng/mL — AB
BENZODIAZEPINES URINE: POSITIVE ng/mL — AB
BUPRENORPHINE SCREEN URINE: POSITIVE ng/mL — AB
CANNABINOIDS URINE: NEGATIVE ng/mL
COCAINE METABOLITES URINE: NEGATIVE ng/mL
ETHANOL URINE: NEGATIVE mg/dL
FENTANYL URINE: POSITIVE ng/mL — AB
METHADONE URINE: NEGATIVE ng/mL
OPIATES URINE: NEGATIVE ng/mL
OXYCOD SCRN URINE: NEGATIVE ng/mL
SPEC VALIDITY SPECIFIC GRAVITY: 1.031 (ref 1.003–1.035)
SPECIMEN VALIDITY URINE CREAT: 200 mg/dL (ref >20)
SPECIMEN VALIDITY URINE PH: 5.5 (ref 5.0–8.5)

## 2022-07-15 MED ORDER — PRAZOSIN HCL 1 MG PO CAPS
2.0000 mg | ORAL_CAPSULE | Freq: Every evening | ORAL | Status: DC
Start: 2022-07-15 — End: 2022-07-15

## 2022-07-15 MED ORDER — BUPRENORPHINE HCL-NALOXONE HCL 8-2 MG SL SUBL
8.0000 mg | SUBLINGUAL_TABLET | Freq: Three times a day (TID) | SUBLINGUAL | Status: DC
Start: 2022-07-15 — End: 2022-07-15
  Administered 2022-07-15: 1 via SUBLINGUAL
  Filled 2022-07-15: qty 1

## 2022-07-15 MED ORDER — FLUOXETINE HCL 20 MG PO CAPS
40.0000 mg | ORAL_CAPSULE | Freq: Every day | ORAL | Status: DC
Start: 2022-07-16 — End: 2022-07-20
  Administered 2022-07-16 – 2022-07-20 (×5): 40 mg via ORAL
  Filled 2022-07-15 (×5): qty 2

## 2022-07-15 MED ORDER — TIOTROPIUM BROMIDE MONOHYDRATE 2.5 MCG/ACT IN AERS
2.0000 | INHALATION_SPRAY | Freq: Every day | RESPIRATORY_TRACT | Status: DC
Start: 2022-07-15 — End: 2022-07-15
  Administered 2022-07-15: 2 via RESPIRATORY_TRACT
  Filled 2022-07-15: qty 4

## 2022-07-15 MED ORDER — ALBUTEROL SULFATE HFA 108 (90 BASE) MCG/ACT IN AERS
2.0000 | INHALATION_SPRAY | Freq: Four times a day (QID) | RESPIRATORY_TRACT | Status: DC | PRN
Start: 2022-07-15 — End: 2022-07-20
  Filled 2022-07-15: qty 6.7

## 2022-07-15 MED ORDER — GABAPENTIN 300 MG PO CAPS
600.0000 mg | ORAL_CAPSULE | Freq: Three times a day (TID) | ORAL | Status: DC
Start: 2022-07-15 — End: 2022-07-15
  Administered 2022-07-15: 600 mg via ORAL
  Filled 2022-07-15: qty 2

## 2022-07-15 MED ORDER — TRAZODONE HCL 50 MG PO TABS
50.0000 mg | ORAL_TABLET | Freq: Every evening | ORAL | Status: DC | PRN
Start: 2022-07-15 — End: 2022-07-18

## 2022-07-15 MED ORDER — BUSPIRONE HCL 5 MG PO TABS
15.0000 mg | ORAL_TABLET | Freq: Three times a day (TID) | ORAL | Status: DC
Start: 2022-07-15 — End: 2022-07-15
  Administered 2022-07-15: 15 mg via ORAL
  Filled 2022-07-15: qty 3

## 2022-07-15 MED ORDER — LEVOTHYROXINE SODIUM 25 MCG PO TABS: 25.0000 ug | ORAL_TABLET | Freq: Every morning | ORAL | Status: DC

## 2022-07-15 MED ORDER — QUETIAPINE FUMARATE 100 MG PO TABS
100.0000 mg | ORAL_TABLET | Freq: Every evening | ORAL | Status: DC
Start: 2022-07-15 — End: 2022-07-18
  Administered 2022-07-16 – 2022-07-17 (×2): 100 mg via ORAL
  Filled 2022-07-15 (×2): qty 1

## 2022-07-15 MED ORDER — OXCARBAZEPINE 150 MG PO TABS
150.0000 mg | ORAL_TABLET | Freq: Every day | ORAL | Status: DC
  Administered 2022-07-15: 150 mg via ORAL
  Filled 2022-07-15: qty 1

## 2022-07-15 MED ORDER — PANTOPRAZOLE SODIUM 40 MG PO TBEC
40.0000 mg | DELAYED_RELEASE_TABLET | Freq: Every day | ORAL | Status: DC
Start: 2022-07-15 — End: 2022-07-15
  Administered 2022-07-15: 40 mg via ORAL
  Filled 2022-07-15: qty 1

## 2022-07-15 MED ORDER — BACLOFEN 10 MG PO TABS
10.0000 mg | ORAL_TABLET | Freq: Two times a day (BID) | ORAL | Status: DC
Start: 2022-07-16 — End: 2022-07-20
  Administered 2022-07-16 – 2022-07-20 (×9): 10 mg via ORAL
  Filled 2022-07-15 (×9): qty 1

## 2022-07-15 MED ORDER — MELATONIN 3 MG PO TABS
3.0000 mg | ORAL_TABLET | Freq: Every evening | ORAL | Status: DC
Start: 2022-07-16 — End: 2022-07-20
  Administered 2022-07-16 – 2022-07-19 (×4): 3 mg via ORAL
  Filled 2022-07-15 (×5): qty 1

## 2022-07-15 MED ORDER — NICOTINE 21 MG/24HR TD PT24
1.0000 | MEDICATED_PATCH | Freq: Every day | TRANSDERMAL | Status: DC
Start: 2022-07-16 — End: 2022-07-20
  Administered 2022-07-16 – 2022-07-20 (×5): 1 via TRANSDERMAL
  Filled 2022-07-15 (×5): qty 1

## 2022-07-15 MED ORDER — MIDAZOLAM HCL 2 MG/2 ML IJ SOLN
4.0000 mg | Freq: Once | INTRAMUSCULAR | Status: AC
Start: 2022-07-15 — End: 2022-07-15
  Administered 2022-07-15: 4 mg via INTRAMUSCULAR
  Filled 2022-07-15: qty 4

## 2022-07-15 MED ORDER — SPIRONOLACTONE 100 MG PO TABS
100.0000 mg | ORAL_TABLET | Freq: Every day | ORAL | Status: DC
Start: 2022-07-16 — End: 2022-07-20
  Administered 2022-07-16 – 2022-07-20 (×5): 100 mg via ORAL
  Filled 2022-07-15 (×5): qty 1

## 2022-07-15 MED ORDER — PANTOPRAZOLE SODIUM 40 MG PO TBEC
40.0000 mg | DELAYED_RELEASE_TABLET | Freq: Every day | ORAL | Status: DC
Start: 2022-07-16 — End: 2022-07-20
  Administered 2022-07-16 – 2022-07-20 (×5): 40 mg via ORAL
  Filled 2022-07-15 (×5): qty 1

## 2022-07-15 MED ORDER — SENNOSIDES 8.6 MG PO TABS
17.2000 mg | ORAL_TABLET | Freq: Every evening | ORAL | Status: DC | PRN
Start: 2022-07-15 — End: 2022-07-15

## 2022-07-15 MED ORDER — LEVETIRACETAM 500 MG PO TABS
750.0000 mg | ORAL_TABLET | Freq: Two times a day (BID) | ORAL | Status: DC
  Administered 2022-07-15: 750 mg via ORAL
  Filled 2022-07-15: qty 1

## 2022-07-15 MED ORDER — POLYETHYLENE GLYCOL 3350 17 G PO PACK
17.0000 g | PACK | Freq: Every day | ORAL | Status: DC
Start: 2022-07-16 — End: 2022-07-20
  Administered 2022-07-18: 17 g via ORAL
  Filled 2022-07-15 (×5): qty 1

## 2022-07-15 MED ORDER — FLUTICASONE PROPIONATE 50 MCG/ACT NA SUSP
2.0000 | Freq: Two times a day (BID) | NASAL | Status: DC
Start: 2022-07-15 — End: 2022-07-20
  Administered 2022-07-16 – 2022-07-20 (×7): 2 via NASAL
  Filled 2022-07-15: qty 16

## 2022-07-15 MED ORDER — PRAZOSIN HCL 1 MG PO CAPS
1.0000 mg | ORAL_CAPSULE | Freq: Every evening | ORAL | Status: DC
Start: 2022-07-16 — End: 2022-07-20
  Administered 2022-07-16 – 2022-07-18 (×3): 1 mg via ORAL
  Filled 2022-07-15 (×4): qty 1

## 2022-07-15 MED ORDER — LEVETIRACETAM 500 MG PO TABS
750.0000 mg | ORAL_TABLET | Freq: Two times a day (BID) | ORAL | Status: DC
Start: 2022-07-15 — End: 2022-07-20
  Administered 2022-07-16 – 2022-07-20 (×9): 750 mg via ORAL
  Filled 2022-07-15 (×9): qty 1

## 2022-07-15 MED ORDER — TETANUS-DIPHTH-ACELL PERTUSSIS 5-2.5-18.5 LF-MCG/0.5 IM (SUPER ERX)
0.50 mL | Freq: Once | INTRAMUSCULAR | Status: AC
Start: 2022-07-15 — End: 2022-07-15
  Administered 2022-07-15: 0.5 mL via INTRAMUSCULAR
  Filled 2022-07-15: qty 0.5

## 2022-07-15 MED ORDER — GABAPENTIN 300 MG PO CAPS
600.0000 mg | ORAL_CAPSULE | Freq: Three times a day (TID) | ORAL | Status: DC
Start: 2022-07-15 — End: 2022-07-20
  Administered 2022-07-16 – 2022-07-20 (×14): 600 mg via ORAL
  Filled 2022-07-15 (×14): qty 2

## 2022-07-15 MED ORDER — FLUOXETINE HCL 20 MG PO CAPS
40.00 mg | ORAL_CAPSULE | Freq: Once | ORAL | Status: AC
Start: 2022-07-15 — End: 2022-07-15
  Administered 2022-07-15: 40 mg via ORAL
  Filled 2022-07-15: qty 2

## 2022-07-15 MED ORDER — BUDESONIDE-FORMOTEROL FUMARATE 80-4.5 MCG/ACT IN AERO
2.0000 | INHALATION_SPRAY | Freq: Two times a day (BID) | RESPIRATORY_TRACT | Status: DC
Start: 2022-07-15 — End: 2022-07-15
  Administered 2022-07-15: 2 via RESPIRATORY_TRACT
  Filled 2022-07-15: qty 6.9

## 2022-07-15 MED ORDER — SPIRONOLACTONE 100 MG PO TABS
100.0000 mg | ORAL_TABLET | Freq: Every day | ORAL | Status: DC
Start: 2022-07-15 — End: 2022-07-15
  Administered 2022-07-15: 100 mg via ORAL
  Filled 2022-07-15: qty 1

## 2022-07-15 MED ORDER — ALUMINUM-MAGNESIUM-SIMETHICONE 200-200-20 MG/5ML PO SUSP
30.0000 mL | ORAL | Status: DC | PRN
Start: 2022-07-15 — End: 2022-07-20
  Filled 2022-07-15: qty 30

## 2022-07-15 MED ORDER — THIAMINE 100 MG PO TABS
100.00 mg | ORAL_TABLET | Freq: Once | ORAL | Status: AC
Start: 2022-07-15 — End: 2022-07-15
  Administered 2022-07-15: 100 mg via ORAL
  Filled 2022-07-15: qty 1

## 2022-07-15 MED ORDER — METHOCARBAMOL 500 MG PO TABS
500.0000 mg | ORAL_TABLET | Freq: Three times a day (TID) | ORAL | Status: DC
Start: 2022-07-16 — End: 2022-07-20
  Administered 2022-07-16 – 2022-07-20 (×14): 500 mg via ORAL
  Filled 2022-07-15 (×14): qty 1

## 2022-07-15 MED ORDER — TIOTROPIUM BROMIDE MONOHYDRATE 2.5 MCG/ACT IN AERS
2.0000 | INHALATION_SPRAY | Freq: Every day | RESPIRATORY_TRACT | Status: DC
Start: 2022-07-16 — End: 2022-07-20
  Administered 2022-07-16 – 2022-07-19 (×4): 2 via RESPIRATORY_TRACT
  Filled 2022-07-15: qty 4

## 2022-07-15 MED ORDER — BUMETANIDE 1 MG PO TABS
3.0000 mg | ORAL_TABLET | Freq: Two times a day (BID) | ORAL | Status: DC
Start: 2022-07-15 — End: 2022-07-15

## 2022-07-15 MED ORDER — MAGNESIUM HYDROXIDE 400 MG/5ML PO SUSP
30.0000 mL | Freq: Every day | ORAL | Status: DC | PRN
Start: 2022-07-15 — End: 2022-07-20

## 2022-07-15 MED ORDER — THIAMINE 100 MG PO TABS
100.0000 mg | ORAL_TABLET | Freq: Every day | ORAL | Status: DC
Start: 2022-07-16 — End: 2022-07-20
  Administered 2022-07-16 – 2022-07-20 (×5): 100 mg via ORAL
  Filled 2022-07-15 (×6): qty 1

## 2022-07-15 MED ORDER — DROPERIDOL 2.5 MG/ML IJ SOLN
5.0000 mg | Freq: Once | INTRAMUSCULAR | Status: AC
Start: 2022-07-15 — End: 2022-07-15
  Administered 2022-07-15: 5 mg via INTRAMUSCULAR
  Filled 2022-07-15: qty 2

## 2022-07-15 MED ORDER — LEVOTHYROXINE SODIUM 25 MCG PO TABS
25.0000 ug | ORAL_TABLET | Freq: Every morning | ORAL | Status: DC
Start: 2022-07-16 — End: 2022-07-20
  Administered 2022-07-16 – 2022-07-20 (×5): 25 ug via ORAL
  Filled 2022-07-15 (×5): qty 1

## 2022-07-15 MED ORDER — ATORVASTATIN CALCIUM 80 MG PO TABS
80.0000 mg | ORAL_TABLET | Freq: Every day | ORAL | Status: DC
Start: 2022-07-16 — End: 2022-07-20
  Administered 2022-07-16 – 2022-07-20 (×5): 80 mg via ORAL
  Filled 2022-07-15 (×5): qty 1

## 2022-07-15 MED ORDER — OLANZAPINE 5 MG PO TBDP
2.5000 mg | ORAL_TABLET | Freq: Four times a day (QID) | ORAL | Status: DC | PRN
Start: 2022-07-15 — End: 2022-07-18
  Administered 2022-07-16: 2.5 mg via ORAL
  Filled 2022-07-15 (×2): qty 1

## 2022-07-15 MED ORDER — OXCARBAZEPINE 150 MG PO TABS
150.0000 mg | ORAL_TABLET | Freq: Two times a day (BID) | ORAL | Status: DC
Start: 2022-07-15 — End: 2022-07-20
  Administered 2022-07-16 – 2022-07-20 (×9): 150 mg via ORAL
  Filled 2022-07-15 (×9): qty 1

## 2022-07-15 MED ORDER — ACETAMINOPHEN 325 MG PO TABS
650.0000 mg | ORAL_TABLET | ORAL | Status: DC | PRN
Start: 2022-07-15 — End: 2022-07-20
  Administered 2022-07-17 – 2022-07-20 (×4): 650 mg via ORAL
  Filled 2022-07-15 (×4): qty 2

## 2022-07-15 MED ORDER — NICOTINE POLACRILEX 2 MG MT GUM
2.0000 mg | CHEWING_GUM | OROMUCOSAL | Status: DC | PRN
Start: 2022-07-15 — End: 2022-07-20
  Administered 2022-07-16 – 2022-07-20 (×20): 2 mg via ORAL
  Filled 2022-07-15 (×21): qty 1

## 2022-07-15 MED ORDER — TORSEMIDE 100 MG PO TABS
100.0000 mg | ORAL_TABLET | Freq: Every day | ORAL | Status: DC
Start: 2022-07-16 — End: 2022-07-20
  Administered 2022-07-16 – 2022-07-20 (×5): 100 mg via ORAL
  Filled 2022-07-15 (×5): qty 1

## 2022-07-15 MED ORDER — BUPRENORPHINE HCL-NALOXONE HCL 8-2 MG SL SUBL
8.0000 mg | SUBLINGUAL_TABLET | Freq: Three times a day (TID) | SUBLINGUAL | Status: DC
Start: 2022-07-16 — End: 2022-07-20
  Administered 2022-07-16 – 2022-07-20 (×14): 1 via SUBLINGUAL
  Filled 2022-07-15 (×14): qty 1

## 2022-07-15 MED ORDER — SENNOSIDES 8.6 MG PO TABS
17.2000 mg | ORAL_TABLET | Freq: Every evening | ORAL | Status: DC
Start: 2022-07-15 — End: 2022-07-20
  Administered 2022-07-16 – 2022-07-19 (×4): 17.2 mg via ORAL
  Filled 2022-07-15 (×4): qty 2

## 2022-07-15 MED ORDER — BUSPIRONE HCL 5 MG PO TABS
15.0000 mg | ORAL_TABLET | Freq: Three times a day (TID) | ORAL | Status: DC
Start: 2022-07-16 — End: 2022-07-18
  Administered 2022-07-16 – 2022-07-18 (×7): 15 mg via ORAL
  Filled 2022-07-15 (×7): qty 1

## 2022-07-15 MED ORDER — ATORVASTATIN CALCIUM 40 MG PO TABS
80.00 mg | ORAL_TABLET | Freq: Once | ORAL | Status: AC
Start: 2022-07-15 — End: 2022-07-15
  Administered 2022-07-15: 80 mg via ORAL
  Filled 2022-07-15: qty 2

## 2022-07-15 MED ORDER — BUPRENORPHINE HCL-NALOXONE HCL 8-2 MG SL SUBL
8.0000 mg | SUBLINGUAL_TABLET | Freq: Two times a day (BID) | SUBLINGUAL | Status: DC
Start: 2022-07-15 — End: 2022-07-15

## 2022-07-15 NOTE — ED Notes (Signed)
This patient was signed out to me at change of shift by my colleague.    Please see ED course below for updates on clinical course.     ED Course as of 07/17/22 2317   Fri Jul 15, 2022   1511 3:11 PM  This is a 51 year old female with a PMHx of OSA, CHF who presents with delusion, self harm.  Medically cleared, IPLOC, bedsearch.           No acute events this shift.       Diagnosis:  Hallucination  Delusional disorder ([name])  Suicidal ideation  Skin abrasion  Disposition:  Dispo pending at attending transition of care      Drema Balzarine, MD

## 2022-07-15 NOTE — Narrator Note (Signed)
Patient remains resting comfortably on stretcher, HOB adjusted so she can sleep.  VS reassessed, stable. Patient showing no signs of acute pain.  Psych NP has seen her, and plan is for inpatient admission.

## 2022-07-15 NOTE — ED Provider Notes (Addendum)
ED RN triage:   Triage Documentation       Cheryl Zimmerman, Heather, RN 07/15/2022 03:24                 Patient arrives in acute psychiatric distress via EMS. [facility], and staff called due to agitation. Patient lost her son, and believes he is inhabiting her body, and trying to get out and hurt people. He is also telling her to hurt people, and she doesn't want to. She has made superficial cuts to her arms and legs in an effort to die, so she doesn't hurt others. Arrives inconsolably crying, incoherent.             HPI:    Cheryl Zimmerman is a 51 year old female patient who has a past medical history of Bipolar affective ([name]) (schizo), Chronic systolic CHF (congestive heart failure) ([name]), Depression, Fall in home (12/23/2021), Gait instability (12/23/2021), Requires continuous at home supplemental oxygen (12/23/2021), Schizo affective schizophrenia ([name]) (depress), Spinal stenosis of lumbar 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 claudication (12/23/2021), and Substance abuse ([name]).     Patient presents by EMS after staff noted that she was agitated and started to cut herself she believes that her child that died is it happening her body and plans to come out to kill other people.  Patient wanted to stop the child from coming out so it does not hurt anyone else.  History is limited because of the patient persistently crying and agitated.  Family history and social history noncontributory to encounter.  No known allergies.        SocHx:      ROS: Pertinent positives were reviewed as per the HPI above. All other systems were reviewed and are negative.    Cheryl Zimmerman  Language of care: English  MRN: [id number]  PCP: Gunnar Fusiwyer, Ian, MD  Mode of arrival to ED: Ambulance [place].  Chief complaint: Psychiatric Problem    Past Medical History/Problem list:  Past Medical History:  schizo: Bipolar affective ([name])  No date: Chronic systolic CHF (congestive heart failure) ([name])  No date: Depression  12/23/2021: Fall in home  12/23/2021:  Gait instability  12/23/2021: Requires continuous at home supplemental oxygen  depress: Schizo affective schizophrenia ([name])  12/23/2021: Spinal stenosis of lumbar 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   claudication  No date: Substance abuse [facility]([name])  Patient Active Problem List:     Dyspnea on minimal exertion     Suicidal behavior with attempted self-injury ([name])     Tobacco use disorder     Systolic congestive heart failure ([name])     Seizure disorder ([name])     Schizoaffective disorder, depressive type ([name])     Suicidal ideation     Afib ([name])     Alcohol use disorder     Borderline personality disorder ([name])     Chronic pain disorder     Morbid obesity ([name])     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 No pertinent surgical history.  Social History:   Social History     Socioeconomic History    Marital status: Divorced     Spouse name: Not on file    Number of children: Not on file    Years of education: Not on file    Highest education level: Not on file   Occupational History    Not on file   Tobacco Use    Smoking status: Every Day     Packs/day: 3.00     Years: 30.00     Pack years: 90.00     Types: Cigarettes     Passive exposure: Current    Smokeless tobacco: Never    Tobacco comments:     Pt currently on nicotine patch and gum   Vaping Use    Vaping Use: Not on file   Substance and Sexual Activity    Alcohol use: Yes     Comment: 2 liters of [place] morgan daily    Drug use: Yes      Types: Marijuana, Cocaine    Sexual activity: Yes     Partners: Male   Other Topics Concern    Not on file   Social History Narrative    Not on file   Social Determinants of Health  Financial Resource Strain: Not on file  Food Insecurity: Not on file  Transportation Needs: Not on file  Physical Activity: Not on file  Stress: Not on file  Social Connections: Not on file  Intimate Partner Violence: Not on file  Housing Stability: Not on file        Allergies: Review of Patient's Allergies indicates:   Bee venom               Anaphylaxis   Metolazone              Other (See Comments)    Comment:Hypokalemia   Methylprednisolone      Dizziness, Drowsiness    Comment:Also believes syncope. Seems to tolerate             inhalational and epidural steroid.   Nutritional supplem*       Hydroxyzine             Nausea Only    Immunizations:   Immunization History   Administered Date(s) Administered    Covid-19 Vaccine (Moderna - Full Dose) 02/03/2020, 04/15/2020    INFLUENZA VIRUS TRI W/PRESV VACCINE 18/> YRS IM (PRIVATE) 06/02/2011   Pended Date(s) Pended    Tdap 07/15/2022               Medications:  Prior to Admission Medications   Prescriptions Last Dose Informant Patient Reported? Taking?   OTHER MEDICATION   No No   Sig: 1 each by Other route in the morning. Wheelchair: Bariatric motorized wheel chare (height 5'9", weight 328 lb, bmi 48.4)  For use as directed.    Please fax to [employer] at [phone]  DX: Spinal stenosis of lumbar 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 claudication, Gait instability, Morbid obesity ([name])  ICD10:M48.061, R26.81, E66.01.   OXcarbazepine (TRILEPTAL) 150 MG tablet   No No   Sig: Take 1 tablet by mouth in the morning and 1 tablet before bedtime.   QUEtiapine (SEROQUEL) 100 MG tablet   No No   Sig: Take 1 tablet by mouth  at bedtime   albuterol HFA 108 (90 Base) MCG/ACT inhaler   No No   Sig: Inhale 2 puffs into the lungs every 6 (six) hours as needed for Wheezing   atorvastatin (LIPITOR) 80 MG tablet   No No    Sig: Take 1 tablet by mouth in the morning.   gabapentin (NEURONTIN) 300 MG capsule   No No   Sig: Take 2 capsules by mouth in the morning and 2 capsules at noon and 2 capsules before bedtime.   levETIRAcetam (KEPPRA) 750 MG tablet   No No   Sig: Take 1 tablet by mouth in the morning and 1 tablet before bedtime.   levothyroxine (SYNTHROID) 25 MCG tablet   No No   Sig: Take 1 tablet by mouth every morning before breakfast   prazosin (MINIPRESS) 1 MG capsule   No No   Sig: Take 1 capsule by mouth nightly   spironolactone (ALDACTONE) 100 MG tablet   No No   Sig: Take 1 tablet by mouth in the morning.   tiotropium (SPIRIVA RESPIMAT) 2.5 MCG/ACT aerosol solution   No No   Sig: Inhale 2 puffs into the lungs in the morning.   torsemide (DEMADEX) 100 MG tablet   No No   Sig: Take 1 tablet by mouth in the morning.   traZODone (DESYREL) 50 MG tablet   No No   Sig: Take 1 tablet by mouth nightly as needed for Sleep      Facility-Administered Medications: None           Physical Exam :   Patient Vitals for the past 999 hrs:   BP Temp Pulse Resp SpO2 Weight   07/15/22 0318 132/78 98 F 79 (!) 22 93 % (!) 149 kg (328 lb 7.8 oz)       Physical Exam  Constitutional:       Appearance: Normal appearance. She is not diaphoretic.   HENT:      Head: Normocephalic and atraumatic.      Mouth/Throat:      Mouth: Mucous membranes are moist.   Eyes:      Conjunctiva/sclera: Conjunctivae normal.      Pupils: Pupils are equal, round, and reactive to light.   Cardiovascular:      Rate and Rhythm: Normal rate and regular rhythm.      Heart sounds: No murmur heard.  Pulmonary:      Effort: Pulmonary effort is normal. No respiratory distress.      Breath sounds: Normal breath sounds. No wheezing.   Abdominal:      General: Abdomen is flat. There is no distension.      Palpations: Abdomen is soft.      Tenderness: There is no abdominal tenderness.   Musculoskeletal:         General: No swelling or tenderness.      Cervical back: No rigidity.    Skin:     General: Skin is warm.      Capillary Refill: Capillary refill takes less than 2 seconds.      Coloration: Skin is not jaundiced or pale.             Comments: Multiple skin abrasions noted throughout.  No evidence of laceration.   Neurological:      General: No focal deficit present.      Mental Status: She is alert and oriented to person, place, and time. Mental status is at baseline.   Psychiatric:  Attention and Perception: She is inattentive. She perceives auditory hallucinations.         Mood and Affect: Mood is anxious. Affect is angry.         Speech: Speech is rapid and pressured.         Behavior: Behavior is agitated and aggressive.         Thought Content: Thought content is delusional. Thought content includes suicidal ideation.          ED Course and Medical Decision-making:    The patient was seen primarily by me. ED nursing record was reviewed. Select prior records as available electronically through the Epic record were reviewed.  The patient presents for evaluation of suicidal, differential diagnosis induces but is not limited to suicide due to medication noncompliance.  Vital signs were reviewed.      Laboratory studies independently reviewed by myself:      Labs Reviewed   CBC, PLATELET & DIFFERENTIAL   BASIC METABOLIC PANEL   SERUM DRUG SCREEN   URINE DRUG SCREEN   LEVETIRACETAM (KEPPRA)   COVID-19 INPATIENT         imagining reviewed:    ED WET READS independently reviewed by myself:  No orders to display    [facility] RADIOLOGY READS:  No orders to display      Medications Given in the ED::    Medications   Tdap (BOOSTRIX) injection 0.5 mL (has no administration in time range)   midazolam (VERSED) injection 4 mg (has no administration in time range)   droperidol (INAPSINE) injection 5 mg (has no administration in time range)       MDM:    Based on my history and exam, patient has an emergency psychiatric condition that requires care coordination by crisis team for inpatient level  of care.    Patient received tetanus in 1991 will update it..    Patient very agitated, will give IM medication to help with control      Repeat examination did not show any new or worsening symptoms.      This note has been created with voice recognition software.   Please excuse any errors in transcription.  Occasional wrong word or sound alike substitution may have occurred due to the inherent limitations of voice recondition software.  Please read the chart carefully and recognize using contact with the substitution may have occurred.        Disposition: None    Condition on : Stable    Diagnosis/Diagnoses:  Hallucination  Delusional disorder ([name])  Suicidal ideation  Skin abrasion    Discharge Prescriptions:      Medication List        ASK your doctor about these medications      albuterol HFA 108 (90 Base) MCG/ACT inhaler  Inhale 2 puffs into the lungs every 6 (six) hours as needed for Wheezing     atorvastatin 80 MG tablet  Commonly known as: LIPITOR  Take 1 tablet by mouth in the morning.     gabapentin 300 MG capsule  Commonly known as: NEURONTIN  Take 2 capsules by mouth in the morning and 2 capsules at noon and 2 capsules before bedtime.     levETIRAcetam 750 MG tablet  Commonly known as: KEPPRA  Take 1 tablet by mouth in the morning and 1 tablet before bedtime.     levothyroxine 25 MCG tablet  Commonly known as: SYNTHROID  Take 1 tablet by mouth every morning before breakfast  OTHER MEDICATION  1 each by Other route in the morning. Wheelchair: Bariatric motorized wheel chare (height 5'9", weight 328 lb, bmi 48.4)  For use as directed.    Please fax to [employer] at [phone]  DX: Spinal stenosis of lumbar 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 claudication, Gait instability, Morbid obesity ([name])  ICD10:M48.061, R26.81, E66.01.     OXcarbazepine 150 MG tablet  Commonly known as: TRILEPTAL  Take 1 tablet by mouth in the morning and 1 tablet before bedtime.     prazosin 1 MG capsule  Commonly known as: MINIPRESS  Take 1  capsule by mouth nightly     QUEtiapine 100 MG tablet  Commonly known as: SEROquel  Take 1 tablet by mouth at bedtime     spironolactone 100 MG tablet  Commonly known as: ALDACTONE  Take 1 tablet by mouth in the morning.     tiotropium 2.5 MCG/ACT aerosol solution  Commonly known as: SPIRIVA RESPIMAT  Inhale 2 puffs into the lungs in the morning.     torsemide 100 MG tablet  Commonly known as: DEMADEX  Take 1 tablet by mouth in the morning.     traZODone 50 MG tablet  Commonly known as: DESYREL  Take 1 tablet by mouth nightly as needed for Sleep                Orlean Patten, MD  Emergency Physician Attending  Medical Toxicologist

## 2022-07-15 NOTE — Narrator Note (Signed)
Pt offered lunch tray but declined.

## 2022-07-15 NOTE — PES EVALUATION NOTE (Incomplete)
PSYCHIATRIC EMERGENCY SERVICE  Evaluation Note    Identifying Data:  [name] residing in [facility] home, hx pSUD schizoaffective disorder bipolar type, PTSD,  PMHx significant for CHF, seizure disorder, hypothyroidism, ?COPD/OSA, GERD, chronic pain, spinal stenosis w/ sciatica, presenting for agitation, SIB cutting self with a razor.     Source of Information: Patient EMR    Chief Complaint: " My son is insight me"     Source of referral: [facility]    History of Present Illness (include acute symptoms, precipitants, changes in  functional status, and level of distress):   Chart review:   - Admitted medically for presumed HFpEF exacerbation in May 2023  - Admitted to  2 for worsening SI from 12/18/21-12/22/21  - Admitted to [facility] for SI/HI  from 04/09/22- 04/12/22   ED RN TRIAGE NOTE  Patient arrives in acute psychiatric distress via EMS. She lives at [facility], and staff called due to agitation. Patient lost her son, and believes he is inhabiting her body, and trying to get out and hurt people. He is also telling her to hurt people, and she doesn't want to. She has made superficial cuts to her arms and legs in an effort to die, so she doesn't hurt others. Arrives inconsolably crying, incoherent.     ED Medicine Attending Note  Royalty Fakhouri is a 51 year old female patient who has a past medical history of Bipolar affective ([place]) (schizo), Chronic systolic CHF (congestive heart failure) ([place]), Depression, Fall in home (12/23/2021), Gait instability (12/23/2021), Requires continuous at home supplemental oxygen (12/23/2021), Schizo affective schizophrenia ([place]) (depress), Spinal stenosis of lumbar 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 claudication (12/23/2021), and Substance abuse ([place]).      Patient presents by EMS after staff noted that she was agitated and started to cut herself she believes that her child that died is it happening her body and plans to come out to kill other people.   Patient wanted to stop the child from coming out so it does not hurt anyone else.  History is limited because of the patient persistently crying and agitated.  Family history and social history noncontributory to encounter.  No known allergies.    [name] EVALUATION   On exam patient was lying on the stretcher, crying, loud.  Interview was limited due to patient being agitated, crying, speech is garble and difficult to comprehend. Patient noted that she came in because her son is insight her. She noted that her son is telling her to cut herself open. She reports of suicidal ideation; however, she denies having any plan. Patient reporting homicidal ideation towards an identify individual name 'Kelly". She states that [place] lives at the nursing home. Patient endorses [facility], voices telling her "to cut [facility] open". Patient denies VH. She noted that they have not being giving her the right medications at the nursing home. However, she does not know the medications they give her. She denies all substance use. Interview was terminated at this time due to patient not participating.      Active Medical Problems:   Patient Active Problem List    Obesity hypoventilation syndrome [facility])         Date Noted: 04/09/2022      Chest pain, atypical         Date Noted: 04/08/2022      Stable angina         Date Noted: 04/07/2022  Lung nodule         Date Noted: 02/03/2022            12/30/2021 CT Chest: Hyperinflation with dependent            atelectasis and a 7 mm nodule in the right upper            lobe. A follow-up chest CT is recommended in 12            months per Fleischner criteria.                               Hypokalemia         Date Noted: 01/11/2022      Requires continuous at home supplemental oxygen         Date Noted: 12/23/2021      Spinal stenosis of lumbar 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 claudication         Date Noted: 12/23/2021      Fall in home         Date Noted: 12/23/2021      Gait instability         Date Noted:  12/23/2021      MDD (major depressive disorder), recurrent, severe, with psychosis ([name])      Suicidal ideation         Date Noted: 12/20/2021      Alcohol use disorder         Date Noted: 12/20/2021      Borderline personality disorder ([name])         Date Noted: 12/20/2021      (HFpEF) heart failure with preserved ejection fraction ([name])         Date Noted: 12/20/2021      Systolic congestive heart failure ([name])         Date Noted: 12/03/2021      Seizure disorder ([name])         Date Noted: 12/03/2021      Schizoaffective disorder, depressive type ([name])         Date Noted: 12/03/2021      Tobacco use disorder         Date Noted: 12/02/2021            TTP July 2023 SH      Suicidal behavior with attempted self-injury [facility])         Date Noted: 11/27/2021      Morbid obesity ([name])         Date Noted: 09/11/2021      Afib ([name])         Date Noted: 10/24/2019      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 )   Current/Most Recent Outpatient Care:  Received care at Nursing home  Safety Concerns: SI/HI  Medication/Treatment Trials: Seroquel     Family History:  History reviewed.  No pertinent family history.      Family History of Mental Illness or Substance Abuse:Unknown    Substance Abuse:  Substance Use Screen  * Used chemicals (other than as prescribed) in the past 12 months?: No  History of Substance Use Related Symptoms: Denies past symptoms  Treatment History: None Reported              Alcohol  * Alcohol Use in past 12 months: No    Trauma History:    Abuse/Trauma History: None    Pertinent Past Medical/Surgical History:  Past Medical History:  schizo: Bipolar affective ([name])  No date: Chronic systolic CHF (congestive heart failure) ([name])  No date: Depression  12/23/2021: Fall in home  12/23/2021: Gait instability  12/23/2021: Requires continuous at home supplemental oxygen  depress: Schizo affective schizophrenia ([name])  12/23/2021: Spinal stenosis of lumbar 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   claudication  No date: Substance abuse ([name])   History reviewed. No pertinent surgical history.    Allergies  Review of Patient's Allergies indicates:   Bee venom               Anaphylaxis   Metolazone              Other (See Comments)    Comment:Hypokalemia   Methylprednisolone      Dizziness, Drowsiness    Comment:Also believes syncope. Seems to tolerate             inhalational and epidural steroid.   Nutritional supplem*       Hydroxyzine             Nausea Only      Pre-Admission Medications:  Prior to Admission Medications   Prescriptions Last Dose Informant Patient Reported? Taking?   OTHER MEDICATION   No No   Sig: 1 each by Other route in the morning. Wheelchair: Bariatric motorized wheel chare (height 5'9", weight 328 lb, bmi 48.4)  For use as directed.    Please fax to [facility] at [phone]  DX: Spinal stenosis of lumbar 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 claudication, Gait instability, Morbid obesity ([name])  ICD10:M48.061, R26.81, E66.01.   OXcarbazepine (TRILEPTAL) 150 MG tablet   No No   Sig: Take 1 tablet by mouth in the morning and 1 tablet before bedtime.   QUEtiapine (SEROQUEL) 100 MG tablet   No No   Sig: Take 1 tablet by mouth at bedtime   albuterol HFA 108 (90 Base) MCG/ACT inhaler   No No   Sig: Inhale 2 puffs into the lungs every 6 (six) hours as needed for Wheezing   atorvastatin (LIPITOR) 80 MG tablet   No No   Sig: Take 1 tablet by mouth in the morning.   gabapentin (NEURONTIN) 300 MG capsule   No No   Sig: Take 2 capsules by mouth in the morning and 2 capsules at noon and  2 capsules before bedtime.   levETIRAcetam (KEPPRA) 750 MG tablet   No No   Sig: Take 1 tablet by mouth in the morning and 1 tablet before bedtime.   levothyroxine (SYNTHROID) 25 MCG tablet   No No   Sig: Take 1 tablet by mouth every morning before breakfast   prazosin (MINIPRESS) 1 MG capsule   No No   Sig: Take 1 capsule by mouth nightly   spironolactone (ALDACTONE) 100 MG tablet  No No   Sig: Take 1 tablet by mouth in the morning.   tiotropium (SPIRIVA RESPIMAT) 2.5 MCG/ACT aerosol solution   No No   Sig: Inhale 2 puffs into the lungs in the morning.   torsemide (DEMADEX) 100 MG tablet   No No   Sig: Take 1 tablet by mouth in the morning.   traZODone (DESYREL) 50 MG tablet   No No   Sig: Take 1 tablet by mouth nightly as needed for Sleep      Facility-Administered Medications: None       Psychosocial History:       Psychosocial  Military: Unknown  Education : Land Support: Psychiatrist;Primary Care Provider  Relationship Status: Separated  Copywriter, advertising for Someone: No  Providing self care at home?: Yes    Mental Status Exam:   Mental Status Exam  General Appearance: Dressed appropriately  Behavior: Disorganized;Agitated  Level of Consciousness: Alert  Orientation Level: Oriented to person  Attention/Concentration: WNL  Mannerisms/Movements: No abnormal mannerisms/movements  Speech Quality and Rate: Variable  Speech Clarity: Mumbling  Speech Tone: Loud  Vocabulary/Fund of Knowledge: WNL  Memory: Grossly intact  Thought Process & Associations: Disorganized;Loose associations  Dissociative Symptoms: None  Thought Content: Delusions;Preoccupations  Delusions: Paranoia  Hallucinations: Auditory  Suicidal Thoughts: Active thoughts  Homicidal Thoughts: Active thoughts  Mood: Anxious  Affect: Anxious  Judgment: Poor  Insight: Limited to mental illness but otherwise fair/good      Risk Assessment:  Suicide Risk Assessment  * Suicidal Ideation?: Yes  Suicidal Ideation (Current): Denies  Suicidal Ideation  (History): Yes  Suicidal Ideation Means/Plan (History): OD  Suicide Attempt?: No  Family Suicide Attempt?: Unknown  Protective Factors: Supportive family/friends;Restricted access to lethal means;Access to medical/psychiatric care  Violence Risk  History of Violence?: No  Current Attempt to Harm: No  Homicidal Ideation?: Yes  Homicidal Ideation With Intent?: Yes  Homicidal Ideation With Intent (Current): Yes  Homicidal Ideation With Intent Description (Current): "Cut open Kelly"  Homicidal Ideation With Intent (History): No  Access to Weapons: Denies  Self Destruction Behaviors: Denies  Self Inflicted Injury?: Yes  Self Inflicted Injury (Current): Yes  Self Inflicted Description (Current): cuting, per report, TW did not visualize  Self Inflicted Injury (History): Yes  Aggression/Poor Impulse Control?: Denies  Medical Conditions Restraint Risk: No  Abuse History Restraint Risk: No       Current Outpatient Treatment:  Current OP Treatment   OP Provider: Psychopharm;Therapist    Collateral Contact:  Clinical/Collateral Contacts (e.g. PCP, Outpatient Psychiatric Treaters, Family)  Name: Kinnie Scales Nurse at Nursing home   Phone: [phone]  Relationship to patient: Nursing home staff  Contacted: without patient consent due to emergency situation, via phone conversation  Details of Contact: request for information and notified Nursing home of patient Homicidal ideation towards another Resident Tresa Endo.     Collateral from nurse Kinnie Scales at Nursing home, [phone] states that patient was found cutting her self with a razor, she was loud, agitated, she was self dialoguing, she started talking about her son who passed away over a year ago. She states that patient was yelling that her son wants to kill her. And she has being having thoughts about hr son. She noted that patient was on Seroquel which was discontinued a while ago, due to patient not wanting it. Patient is followed by psych NP at the nursing home.     TW  notified Nursing home,  spoke with Steward Drone  N, RN,  that patient is reporting Homicidal ideation with a plan to cut open Tresa EndoKelly who is another resident at the nursing home. Steward DroneBrenda RN acknowledged  that Fredrich RomansKelly H. is another Resident at the nursing home who is friends with Gavin Poundeborah.    DSM-V    Primary Diagnosis: Schizoaffective disorder, bipolar type ([name])          Case Formulation  Assessment/Formulation (including risk assessment):   [name] residing in [facility], hx pSUD schizoaffective disorder bipolar type, PTSD,  PMHx significant for CHF, seizure disorder, hypothyroidism, ?COPD/OSA, GERD, chronic pain, spinal stenosis w/ sciatica, presenting for agitation, SIB cutting self with a razor.   Patient presentation was notable for disorganization, agitation, crying loud, paranoid delusions, AH, Suicidal ideation, homicidal ideation towards an identify individual, SIB, cutting with razor, poor sleep. This presentation is consistent decompensation of patient known diagnosis of schizoaffective disorder bipolar type.   In terms of risks, patient is at risk elevated chronic risk of harm to self-based on hx of Schizoaffective d/o bipolar type, hx of PTSD, hx of pSUD. These risks are mitigated by patient having OP, community support and supportive family.  Patient is at acute moderate risk of harm to self and others based on suicidal ideation, homicidal ideation with a plan, and an identify target, disorganization, and psychosis.  Therefore, patient meets section 12 criteria. Patient will require in patient hospitalization for safety, containment, stabilization, pharmacological intervention and after care planning.    De-Escalation Plan:    Consider offering the following medications:     - In case of acute escalation or agitation that cannot be verbally redirected, recommend the following meds:  - Mild agitation use Ativan 1 mg po   - Moderate or severe agitation give Ativan 2 mg po or IV +  Haldol 5 mg po or IV.  Treatment Recommendations/Rationale for Recommended Level of Care:     PLAN   -Patient Does meet section 12 criteria, patient may not leave AMA, continue safety watch  -Disposition: IPLOC    - Recommendations communicated to ED medicine attending, Orlean PattenJefri Mohamed MD      Contact [name] at ex [phone] with questions or concerns  Lakeesha Fontanilla PMHNP-BC

## 2022-07-15 NOTE — Plan of Care (Addendum)
Admit note: 51 y/o with DX: Bipolar D/O, Schizoaffective D/O, supplemental O2 at 2 L continuous, morbid obesity, and gait instability who uses wheelchair to ambulate though can transfer with assist.  Arrives hostile, angry, and very uncooperative who agrees to sign in on voluntary and will not participate any further and demands to go straight to bed. Pt skin check reveals scratches to bilateral arms superficial and undressed, otherwise intact. Pt reportedly has delusions that her dead son has inhabited her body and that is why she scratched self. Also has auditory hallucinations.Offered food and fluids though declined. Uncooperative and refused to answer any further questions so placed in bed at this time per pt request.

## 2022-07-15 NOTE — IPASS Handoff (Signed)
ED to IP Psych nursing I-PASS    I:  "Watcher"      P:  Cheryl Zimmerman is a 51 year old female with PMHx of Past Medical History:  schizo: Bipolar affective ([place])  No date: Chronic systolic CHF (congestive heart failure) ([place])  No date: Depression  12/23/2021: Fall in home  12/23/2021: Gait instability  12/23/2021: Requires continuous at home supplemental oxygen  depress: Schizo affective schizophrenia ([place])  12/23/2021: Spinal stenosis of lumbar 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   claudication  No date: Substance abuse ([place])      Patient uses Pronouns: She/Her/Hers     Guardian/Custody: N/A    Patient's preferred language is [place].      A:  Chief complaint: +SI, hallucinations, skin abrasion, dellusional disorder    The patient's current symptoms are Mood changes  Substance abuse  Anxiety    Disruptive behavior: N/A    Has the patient been in restraints in the ED?  No    Has the patient had a recent assault in the ED?  No    Has the patient attempted to elope from the ED?  No    Vital signs:    07/15/22  0347 07/15/22  0430 07/15/22  1217 07/15/22  1558   BP: 132/75 113/65 111/70    Pulse: 82 73 61    Resp: (!) 22 15 16 18    Temp:       SpO2:  93% 94%    Weight:           Is the patient experiencing pain? Yes Chronic pain  If yes, please describe.     Medications administered in ED:  Medications   tiotropium (SPIRIVA RESPIMAT) inhaler 2 puff (2 puffs Inhalation Given 07/15/22 1624)   sennosides (SENOKOT) tablet 17.2 mg (has no administration in time range)   prazosin (MINIPRESS) capsule 2 mg (has no administration in time range)   spironolactone (ALDACTONE) tablet 100 mg (100 mg Oral Given 07/15/22 1558)   OXcarbazepine (TRILEPTAL) tablet 150 mg (150 mg Oral Given 07/15/22 1558)   levothyroxine (SYNTHROID) tablet 25 mcg (has no administration in time range)   gabapentin (NEURONTIN) capsule 600 mg (600 mg Oral Given 07/15/22 1557)   levETIRAcetam (KEPPRA) tablet 750 mg (750 mg Oral Given 07/15/22 1558)   pantoprazole (PROTONIX)  EC tablet 40 mg (40 mg Oral Given 07/15/22 1559)   busPIRone (BUSPAR) tablet 15 mg (15 mg Oral Given 07/15/22 1558)   budesonide-formoterol (SYMBICORT) 80-4.5 MCG/ACT inhaler 2 puff (2 puffs Inhalation Given 07/15/22 1624)   buprenorphine-naloxone (SUBOXONE) 8-2 MG SL tablet 1 tablet (1 tablet Sublingual Given 07/15/22 1558)   Tdap (BOOSTRIX) injection 0.5 mL (0.5 mLs Intramuscular Given 07/15/22 0345)   midazolam (VERSED) injection 4 mg (4 mg Intramuscular Given 07/15/22 0347)   droperidol (INAPSINE) injection 5 mg (5 mg Intramuscular Given 07/15/22 0344)   thiamine (VITAMIN B-1) tablet 100 mg (100 mg Oral Given 07/15/22 1557)   FLUoxetine (PROzac) capsule 40 mg (40 mg Oral Given 07/15/22 1558)   atorvastatin (LIPITOR) tablet 80 mg (80 mg Oral Given 07/15/22 1558)         S:  Cheryl Zimmerman does not have current mental or physical or communication impairment.  If yes, please describe.     The patient was brought into the ED via Ambulance.    Current legal status: Section 14      S:  The patient has no critical findings.  If yes, please describe.    Review  of Patient's Allergies indicates:   Bee venom               Anaphylaxis   Metolazone              Other (See Comments)    Comment:Hypokalemia   Methylprednisolone      Dizziness, Drowsiness    Comment:Also believes syncope. Seems to tolerate             inhalational and epidural steroid.   Nutritional supplem*       Hydroxyzine             Nausea Only.    ED Fall Risk Score: 0    The patient is currently on suicide precautions.     Patient reports suicidal ideation without plan, no homicidal ideation.  * Suicidal Ideation?: Yes  Suicidal Ideation (Current): Denies                C-SSRS Triage Screener Risk Score: No Risk    Patient reports use of Benzodiazapenes , Opioids, and Stimulants.  Withdrawal sxs:  N/A    Sexual behavior hx:  N/A  Is this patient a registered sex offender?  unknown    Does the patient have a hx of violence?  unknown   If yes,  describe.  Current Attempt to Harm: No     Homicidal Ideation?: Yes     Homicidal Ideation With Intent?: Yes  Homicidal Ideation With Intent (Current): Yes  Homicidal Ideation With Intent Description (Current): "Cut open [name]"  Access to Weapons: Denies     Self Destruction Behaviors: Denies        Self Inflicted Injury?: Yes  Self Inflicted Injury (Current): Yes  Self Inflicted Description (Current): cuting, per report, TW did not visualize  Aggression/Poor Impulse Control?: Denies        Medical Conditions Restraint Risk: No  Abuse History Restraint Risk: No    * Do you feel safe at home?: Not Asked  * Do You Feel Unsafe in Your Relationship(s)?: Not asked  Other Persons at Risk:  (patient reports HI towards some one by name [name] in the nursing home)        Abuse/Trauma (Current): None  Restraining Order?: Not now and no past history       Was a patient search completed? Yes  There was not evidence of contraband and/or weapons.      Verbal summary by receiver to confirm understanding of Handoff information.

## 2022-07-15 NOTE — Narrator Note (Signed)
Patient medicated per MAR.  Much improvement noted at this time.  Patient resting on stretcher in room, HOB elevated.  Breathing even and unlabored.  Pillow and warm blanket for comfort.  She has been able to remove the cloth from her eyes and is less bothered by the lights in hallway.  Lights in room kept dimmed to decrease stim.  Will continue to monitor.

## 2022-07-15 NOTE — PES NOTES (Signed)
Call [facility] insurance to get authorization, no answer, left a message.

## 2022-07-15 NOTE — PES NOTES (Signed)
[name] initial eval fax to [phone] @6 :20pm

## 2022-07-15 NOTE — Narrator Note (Signed)
Pt moving around in bed, saying she hurts and needs her meds. Med list already given to MD for ordering.

## 2022-07-15 NOTE — PES EVALUATION NOTE (Signed)
Copied from [employer], APRN:    PSYCHIATRIC EMERGENCY SERVICE  Evaluation Note     Identifying Data:  [name] residing in [facility], hx pSUD schizoaffective disorder bipolar type, PTSD,  PMHx significant for CHF, seizure disorder, hypothyroidism, ?COPD/OSA, GERD, chronic pain, spinal stenosis w/ sciatica, presenting for agitation, SIB cutting self with a razor.      Source of Information: Patient EMR     Chief Complaint: " My son is insight me"      Source of referral: [facility]     History of Present Illness (include acute symptoms, precipitants, changes in  functional status, and level of distress):   Chart review:   - Admitted medically for presumed HFpEF exacerbation in May 2023  - Admitted to [facility] for worsening SI from 12/18/21-12/22/21  - Admitted to [facility] for SI/HI  from 04/09/22- 04/12/22   ED RN TRIAGE NOTE  Patient arrives in acute psychiatric distress via EMS. She lives at [facility], and staff called due to agitation. Patient lost her son, and believes he is inhabiting her body, and trying to get out and hurt people. He is also telling her to hurt people, and she doesn't want to. She has made superficial cuts to her arms and legs in an effort to die, so she doesn't hurt others. Arrives inconsolably crying, incoherent.      ED Medicine Attending Note  Takina Busser is a 51 year old female patient who has a past medical history of Bipolar affective ([name]) (schizo), Chronic systolic CHF (congestive heart failure) ([name]), Depression, Fall in home (12/23/2021), Gait instability (12/23/2021), Requires continuous at home supplemental oxygen (12/23/2021), Schizo affective schizophrenia ([name]) (depress), Spinal stenosis of lumbar 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 claudication (12/23/2021), and Substance abuse ([name]).      Patient presents by EMS after staff noted that she was agitated and started to cut herself she believes that her child that died is it happening her body  and plans to come out to kill other people.  Patient wanted to stop the child from coming out so it does not hurt anyone else.  History is limited because of the patient persistently crying and agitated.  Family history and social history noncontributory to encounter.  No known allergies.     [name] EVALUATION   On exam patient was lying on the stretcher, crying, loud.  Interview was limited due to patient being agitated, crying, speech is garble and difficult to comprehend. Patient noted that she came in because her son is insight her. She noted that her son is telling her to cut herself open. She reports of suicidal ideation; however, she denies having any plan. Patient reporting homicidal ideation towards an identify individual name 'Kelly". She states that Kingston lives at the nursing home. Patient endorses CAH, voices telling her "to cut [facility] open". Patient denies VH. She noted that they have not being giving her the right medications at the nursing home. However, she does not know the medications they give her. She denies all substance use. Interview was terminated at this time due to patient not participating.        Active Medical Problems:     Patient Active Problem List       Obesity hypoventilation syndrome [facility])         Date Noted: 04/09/2022       Chest pain, atypical         Date Noted: 04/08/2022  Stable angina         Date Noted: 04/07/2022       Lung nodule         Date Noted: 02/03/2022            12/30/2021 CT Chest: Hyperinflation with dependent            atelectasis and a 7 mm nodule in the right upper            lobe. A follow-up chest CT is recommended in 12            months per Fleischner criteria.                                Hypokalemia         Date Noted: 01/11/2022       Requires continuous at home supplemental oxygen         Date Noted: 12/23/2021       Spinal stenosis of lumbar 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 claudication         Date Noted: 12/23/2021       Fall in home         Date  Noted: 12/23/2021       Gait instability         Date Noted: 12/23/2021       MDD (major depressive disorder), recurrent, severe, with psychosis ([name])       Suicidal ideation         Date Noted: 12/20/2021       Alcohol use disorder         Date Noted: 12/20/2021       Borderline personality disorder ([name])         Date Noted: 12/20/2021       (HFpEF) heart failure with preserved ejection fraction ([name])         Date Noted: 12/20/2021       Systolic congestive heart failure ([name])         Date Noted: 12/03/2021       Seizure disorder ([name])         Date Noted: 12/03/2021       Schizoaffective disorder, depressive type ([name])         Date Noted: 12/03/2021       Tobacco use disorder         Date Noted: 12/02/2021            TTP July 2023 SH       Suicidal behavior with attempted self-injury [facility]([name])         Date Noted: 11/27/2021       Morbid obesity ([name])         Date Noted: 09/11/2021       Afib ([name])         Date Noted: 10/24/2019       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 )   Current/Most Recent Outpatient Care: Received care at Nursing home  Safety Concerns: SI/HI  Medication/Treatment Trials: Seroquel      Family History:    History reviewed.  No pertinent family history.              Family History of Mental Illness or Substance Abuse:Unknown     Substance Abuse:  Substance Use Screen  * Used chemicals (other than as prescribed) in the past 12 months?: No  History of Substance Use Related Symptoms: Denies past symptoms  Treatment  History: None Reported  Alcohol  * Alcohol Use in past 12 months: No     Trauma History:   Abuse/Trauma History: None     Pertinent Past Medical/Surgical History:    Past Medical History:     schizo: Bipolar affective ([name])  No date: Chronic systolic CHF (congestive heart failure) ([name])  No date: Depression  12/23/2021: Fall in home  12/23/2021: Gait instability  12/23/2021: Requires continuous at home supplemental oxygen  depress: Schizo affective schizophrenia ([name])  12/23/2021: Spinal stenosis of lumbar 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   claudication  No date: Substance abuse ([name])      History reviewed. No pertinent surgical history.           Allergies    Review of Patient's Allergies indicates:      Bee venom               Anaphylaxis   Metolazone              Other (See Comments)    Comment:Hypokalemia   Methylprednisolone      Dizziness, Drowsiness    Comment:Also believes syncope. Seems to tolerate             inhalational and epidural steroid.   Nutritional supplem*       Hydroxyzine             Nausea Only           Pre-Admission Medications:  Prior to Admission Medications   Prescriptions Last Dose Informant Patient Reported? Taking?   OTHER MEDICATION     No No   Sig: 1 each by Other route in the morning. Wheelchair: Bariatric motorized wheel chare (height 5'9", weight 328 lb, bmi 48.4)  For use as directed.     Please fax to [facility] at [phone]  DX: Spinal stenosis of lumbar 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 claudication, Gait instability, Morbid obesity ([name])  ICD10:M48.061, R26.81, E66.01.   OXcarbazepine (TRILEPTAL) 150 MG tablet     No No   Sig: Take 1 tablet by mouth in the morning and 1 tablet before bedtime.   QUEtiapine (SEROQUEL) 100 MG tablet     No No   Sig: Take 1 tablet by mouth at bedtime   albuterol HFA 108 (90 Base) MCG/ACT inhaler     No No   Sig: Inhale 2 puffs into the lungs every 6 (six) hours as needed for Wheezing   atorvastatin (LIPITOR) 80 MG tablet     No No   Sig: Take 1 tablet by mouth in the  morning.   gabapentin (NEURONTIN) 300 MG capsule     No No   Sig: Take 2 capsules by mouth in the morning and 2 capsules at  noon and 2 capsules before bedtime.   levETIRAcetam (KEPPRA) 750 MG tablet     No No   Sig: Take 1 tablet by mouth in the morning and 1 tablet before bedtime.   levothyroxine (SYNTHROID) 25 MCG tablet     No No   Sig: Take 1 tablet by mouth every morning before breakfast   prazosin (MINIPRESS) 1 MG capsule     No No   Sig: Take 1 capsule by mouth nightly   spironolactone (ALDACTONE) 100 MG tablet     No No   Sig: Take 1 tablet by mouth in the morning.   tiotropium (SPIRIVA RESPIMAT) 2.5 MCG/ACT aerosol solution     No No   Sig: Inhale 2 puffs into the lungs in the morning.   torsemide (DEMADEX) 100 MG tablet     No No   Sig: Take 1 tablet by mouth in the morning.   traZODone (DESYREL) 50 MG tablet     No No   Sig: Take 1 tablet by mouth nightly as needed for Sleep      Facility-Administered Medications: None         Psychosocial History:       Psychosocial  Military: Unknown  Education : [employer]: Psychiatrist;Primary Care Provider  Relationship Status: Separated  Copywriter, advertising for Someone: No  Providing self care at home?: Yes     Mental Status Exam:   Mental Status Exam  General Appearance: Dressed appropriately  Behavior: Disorganized;Agitated  Level of Consciousness: Alert  Orientation Level: Oriented to person  Attention/Concentration: WNL  Mannerisms/Movements: No abnormal mannerisms/movements  Speech Quality and Rate: Variable  Speech Clarity: Mumbling  Speech Tone: Loud  Vocabulary/Fund of Knowledge: WNL  Memory: Grossly intact  Thought Process & Associations: Disorganized;Loose associations  Dissociative Symptoms: None  Thought Content: Delusions;Preoccupations  Delusions: Paranoia  Hallucinations: Auditory  Suicidal Thoughts: Active thoughts  Homicidal Thoughts: Active thoughts  Mood: Anxious  Affect: Anxious  Judgment: Poor  Insight: Limited to mental illness but  otherwise fair/good        Risk Assessment:  Suicide Risk Assessment  * Suicidal Ideation?: Yes  Suicidal Ideation (Current): Denies  Suicidal Ideation (History): Yes  Suicidal Ideation Means/Plan (History): OD  Suicide Attempt?: No  Family Suicide Attempt?: Unknown  Protective Factors: Supportive family/friends;Restricted access to lethal means;Access to medical/psychiatric care  Violence Risk  History of Violence?: No  Current Attempt to Harm: No  Homicidal Ideation?: Yes  Homicidal Ideation With Intent?: Yes  Homicidal Ideation With Intent (Current): Yes  Homicidal Ideation With Intent Description (Current): "Cut open Kelly"  Homicidal Ideation With Intent (History): No  Access to Weapons: Denies  Self Destruction Behaviors: Denies  Self Inflicted Injury?: Yes  Self Inflicted Injury (Current): Yes  Self Inflicted Description (Current): cuting, per report, TW did not visualize  Self Inflicted Injury (History): Yes  Aggression/Poor Impulse Control?: Denies  Medical Conditions Restraint Risk: No  Abuse History Restraint Risk: No     Current Outpatient Treatment:  Current OP Treatment   OP Provider: Psychopharm;Therapist     Collateral Contact:  Clinical/Collateral Contacts (e.g. PCP, Outpatient Psychiatric Treaters, Family)  Name: Kinnie Scales Nurse at Nursing home   Phone: [phone]  Relationship to patient: Nursing home staff  Contacted: without patient consent due to emergency situation, via phone conversation  Details of Contact: request for information and notified Nursing home of patient Homicidal ideation towards another Resident Tresa Endo.      Collateral from nurse  Herbie Saxon at Nursing home, [phone] states that patient was found cutting her self with a razor, she was loud, agitated, she was self dialoguing, she started talking about her son who passed away over a year ago. She states that patient was yelling that her son wants to kill her. And she has being having thoughts about hr son. She noted that  patient was on Seroquel which was discontinued a while ago, due to patient not wanting it. Patient is followed by psych NP at the nursing home.     TW notified Nursing home,  spoke with Herbie Saxon, RN,  that patient is reporting Homicidal ideation with a plan to cut open [name] who is another resident at the nursing home. Hassan Rowan RN acknowledged  that Fredderick Phenix. is another Resident at the nursing home who is friends with [name].     DSM-V    Primary Diagnosis: Schizoaffective disorder, bipolar type ([place])            Case Formulation  Assessment/Formulation (including risk assessment):   [name] residing in [facility] home, hx pSUD schizoaffective disorder bipolar type, PTSD,  PMHx significant for CHF, seizure disorder, hypothyroidism, ?COPD/OSA, GERD, chronic pain, spinal stenosis w/ sciatica, presenting for agitation, SIB cutting self with a razor.   Patient presentation was notable for disorganization, agitation, crying loud, paranoid delusions, AH, Suicidal ideation, homicidal ideation towards an identify individual, SIB, cutting with razor, poor sleep. This presentation is consistent decompensation of patient known diagnosis of schizoaffective disorder bipolar type.   In terms of risks, patient is at risk elevated chronic risk of harm to self-based on hx of Schizoaffective d/o bipolar type, hx of PTSD, hx of pSUD. These risks are mitigated by patient having OP, community support and supportive family.  Patient is at acute moderate risk of harm to self and others based on suicidal ideation, homicidal ideation with a plan, and an identify target, disorganization, and psychosis.  Therefore, patient meets section 12 criteria. Patient will require in patient hospitalization for safety, containment, stabilization, pharmacological intervention and after care planning.     De-Escalation Plan:    Consider offering the following medications:     - In case of acute escalation or agitation that  cannot be verbally redirected, recommend the following meds:  - Mild agitation use Ativan 1 mg po   - Moderate or severe agitation give Ativan 2 mg po or IV + Haldol 5 mg po or IV.  Treatment Recommendations/Rationale for Recommended Level of Care:      PLAN   -Patient Does meet section 12 criteria, patient may not leave AMA, continue safety watch  -Disposition: IPLOC    - Recommendations communicated to ED medicine attending, Asencion Noble MD      Contact [name] at ex [phone] with questions or concerns  Maybelle Mbiatem PMHNP-BC

## 2022-07-15 NOTE — Narrator Note (Signed)
Pt still sleeping with even and unlabored resp 16/min.

## 2022-07-15 NOTE — Narrator Note (Signed)
[facility] nurses here to transport pt to unit with all belongings, chart and security escort

## 2022-07-15 NOTE — ED Triage Note (Signed)
Patient arrives in acute psychiatric distress via EMS. She lives at [facility], and staff called due to agitation. Patient lost her son, and believes he is inhabiting her body, and trying to get out and hurt people. He is also telling her to hurt people, and she doesn't want to. She has made superficial cuts to her arms and legs in an effort to die, so she doesn't hurt others. Arrives inconsolably crying, incoherent.

## 2022-07-15 NOTE — PES NOTES (Signed)
Authorization begin 07/15/2022 last cover day will be 07/19/2022, if additional day is needed please call Anderson Malta M-phone [phone]    Fax initial eval to [phone]    Auth number # [id number]

## 2022-07-15 NOTE — ED Notes (Signed)
This patient was signed out to me at change of shift by my colleague.    51 year old patient with  has a past medical history of Bipolar affective ([place]) (schizo), Chronic systolic CHF (congestive heart failure) ([place]), Depression, Fall in home (12/23/2021), Gait instability (12/23/2021), Requires continuous at home supplemental oxygen (12/23/2021), Schizo affective schizophrenia ([name]) (depress), Spinal stenosis of lumbar 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 claudication (12/23/2021), and Substance abuse ([place]). here with Hallucination  Delusional disorder ([place])  Suicidal ideation  Skin abrasion .     Please see ED course below for updates on clinical course.     ED Course as of 07/15/22 0704   Fri Oct 27, 981   [id number] 51 year old female comes in after cutting herself due to delusions, pending bed search for inpatient level of care     Update  Medications put in for pt    No acute events this shift unless noted otherwise above.       Cleta Alberts. Pearline Cables, MD

## 2022-07-15 NOTE — Narrator Note (Signed)
Pt offered dinner tray but declined.

## 2022-07-15 NOTE — Narrator Note (Signed)
Patient remains sleeping on stretcher, no acute distress.  Remains on safety watch 4:1

## 2022-07-15 NOTE — Narrator Note (Signed)
Assumed care of pt at this time from Heather RN. Pt sleeping in bed with even and unlabored resp 16/min. 4:1 safety watch and B/S cont.

## 2022-07-15 NOTE — Narrator Note (Signed)
Pt up and able to stand and pivot by herself to B/S commode and then got self back into bed. Pt states she is falling asleep and needs 2 Lt O2 due to sleep apnea. Pt placed on 2 Lt O2 even though O2 level was 95%.

## 2022-07-15 NOTE — Narrator Note (Signed)
Patient Disposition  Patient education for diagnosis, medications, activity, diet and follow-up.  Patient left ED 7:20 PM.  Patient rep received written instructions.    Interpreter to provide instructions: No    Patient belongings with patient: YES    Have all existing LDAs been addressed? Yes    Have all IV infusions been stopped? Yes    Destination: Admit to:  [facility]

## 2022-07-15 NOTE — Narrator Note (Signed)
Report to [place] on [facility] [facility] and they will come down for pt

## 2022-07-15 NOTE — Narrator Note (Signed)
Patient remains sleeping on stretcher, no acute distress.

## 2022-07-15 NOTE — Narrator Note (Signed)
RN went in to give pt her medications and pt very agitated and argumentative.

## 2022-07-16 DIAGNOSIS — E785 Hyperlipidemia, unspecified: Secondary | ICD-10-CM | POA: Diagnosis not present

## 2022-07-16 DIAGNOSIS — F1990 Other psychoactive substance use, unspecified, uncomplicated: Secondary | ICD-10-CM

## 2022-07-16 DIAGNOSIS — J449 Chronic obstructive pulmonary disease, unspecified: Secondary | ICD-10-CM | POA: Diagnosis not present

## 2022-07-16 DIAGNOSIS — I209 Angina pectoris, unspecified: Secondary | ICD-10-CM | POA: Diagnosis not present

## 2022-07-16 DIAGNOSIS — R45850 Homicidal ideations: Secondary | ICD-10-CM

## 2022-07-16 DIAGNOSIS — R45851 Suicidal ideations: Secondary | ICD-10-CM

## 2022-07-16 DIAGNOSIS — E039 Hypothyroidism, unspecified: Secondary | ICD-10-CM

## 2022-07-16 DIAGNOSIS — I4891 Unspecified atrial fibrillation: Secondary | ICD-10-CM

## 2022-07-16 DIAGNOSIS — I5030 Unspecified diastolic (congestive) heart failure: Secondary | ICD-10-CM

## 2022-07-16 DIAGNOSIS — R7303 Prediabetes: Secondary | ICD-10-CM

## 2022-07-16 LAB — CBC, PLATELET & DIFFERENTIAL
ABSOLUTE BASO COUNT: 0 10*3/uL (ref 0.0–0.1)
ABSOLUTE EOSINOPHIL COUNT: 0.2 10*3/uL (ref 0.0–0.8)
ABSOLUTE IMM GRAN COUNT: 0.03 10*3/uL (ref 0.00–0.10)
ABSOLUTE LYMPH COUNT: 3.3 10*3/uL (ref 0.6–5.9)
ABSOLUTE MONO COUNT: 0.6 10*3/uL (ref 0.2–1.4)
ABSOLUTE NEUTROPHIL COUNT: 4.2 10*3/uL (ref 1.6–8.3)
ABSOLUTE NRBC COUNT: 0 10*3/uL (ref 0.0–0.0)
BASOPHIL %: 0.1 % (ref 0.0–1.2)
EOSINOPHIL %: 2.4 % (ref 0.0–7.0)
HEMATOCRIT: 44.1 % (ref 34.1–44.9)
HEMOGLOBIN: 14 g/dL (ref 11.2–15.7)
IMMATURE GRANULOCYTE %: 0.4 % (ref 0.0–1.0)
LYMPHOCYTE %: 39.7 % (ref 15.0–54.0)
MEAN CORP HGB CONC: 31.7 g/dL (ref 31.0–37.0)
MEAN CORPUSCULAR HGB: 29.8 pg (ref 26.0–34.0)
MEAN CORPUSCULAR VOL: 93.8 fl (ref 80.0–100.0)
MEAN PLATELET VOLUME: 11.2 fL (ref 8.7–12.5)
MONOCYTE %: 6.7 % (ref 4.0–13.0)
NEUTROPHIL %: 50.7 % (ref 40.0–75.0)
NRBC %: 0 % (ref 0.0–0.0)
PLATELET COUNT: 280 10*3/uL (ref 150–400)
RBC DISTRIBUTION WIDTH STD DEV: 46.5 fL — ABNORMAL HIGH (ref 35.1–46.3)
RED BLOOD CELL COUNT: 4.7 M/uL (ref 3.90–5.20)
WHITE BLOOD CELL COUNT: 8.2 10*3/uL (ref 4.0–11.0)

## 2022-07-16 LAB — BASIC METABOLIC PANEL
ANION GAP: 12 mmol/L (ref 10–22)
BUN (UREA NITROGEN): 10 mg/dL (ref 7–18)
CALCIUM: 9.2 mg/dL (ref 8.5–10.5)
CARBON DIOXIDE: 30 mmol/L (ref 21–32)
CHLORIDE: 99 mmol/L (ref 98–107)
CREATININE: 0.8 mg/dL (ref 0.4–1.2)
ESTIMATED GLOMERULAR FILT RATE: >60 mL/min (ref >60)
Glucose Random: 100 mg/dL (ref 74–160)
POTASSIUM: 3.8 mmol/L (ref 3.5–5.1)
SODIUM: 141 mmol/L (ref 136–145)

## 2022-07-16 LAB — FREE THYROXINE: FREE THYROXINE: 0.94 ng/dL (ref 0.93–1.70)

## 2022-07-16 LAB — MAGNESIUM: MAGNESIUM: 1.8 mg/dL (ref 1.6–2.6)

## 2022-07-16 LAB — THYROID SCREEN TSH REFLEX FT4: THYROID SCREEN TSH REFLEX FT4: 5.54 u[IU]/mL — ABNORMAL HIGH (ref 0.270–4.200)

## 2022-07-16 LAB — SERUM DRUG SCREEN
ACETAMINOPHEN: <5 ug/mL (ref 10–30)
ETHANOL: <10 mg/dL (ref 0–10)
SALICYLATE: <0.5 mg/dL (ref 3.0–20.0)

## 2022-07-16 LAB — HEMOGLOBIN A1C
ESTIMATED AVERAGE GLUCOSE: 140 mg/dL (ref 74–160)
HEMOGLOBIN A1C: 6.5 % — ABNORMAL HIGH (ref 4.0–5.6)

## 2022-07-16 LAB — PHOSPHORUS: PHOSPHORUS: 3.4 mg/dL (ref 2.5–4.9)

## 2022-07-16 MED ORDER — BUDESONIDE-FORMOTEROL FUMARATE 160-4.5 MCG/ACT IN AERO
2.0000 | INHALATION_SPRAY | Freq: Two times a day (BID) | RESPIRATORY_TRACT | Status: DC
Start: 2022-07-16 — End: 2022-07-20
  Administered 2022-07-16 – 2022-07-20 (×7): 2 via RESPIRATORY_TRACT
  Filled 2022-07-16: qty 6

## 2022-07-16 MED ORDER — LIDOCAINE 4 % EX PTCH
1.0000 | MEDICATED_PATCH | CUTANEOUS | Status: DC
Start: 2022-07-16 — End: 2022-07-20
  Administered 2022-07-18 – 2022-07-20 (×3): 1 via TOPICAL
  Filled 2022-07-16 (×4): qty 1

## 2022-07-16 NOTE — Progress Notes (Addendum)
Patient has been visible in the milieu, ambulates with rolling walker, sitting in the TV room watching movies, anxious, irritable, intermittently agitated, loud, argumentative, upset when needs are not met immediately, requested and received prn Zyprexa  with calming effect. Took medications as prescribed including nicorette gums for craving. She ate 50% and 100% of her meals, adequate po fluids intake. She Continues on oxygen therapy, O2 sat 93% on 2L, no SOB reported. Assisted with ADLs care. Reported + BM, refused Miralax. Denies SI/HI/AH/VH. No safety concerns. Assisted as needed.  Scratches to bilateral arms dry, no drainage, no c/o pain.    BP 119/79   Pulse 74   Temp 97.5 F (36.4 C) (Axillary)   Resp 18   Ht _0  (1.753 m)   Wt (!) 155.5 kg (342 lb 12.8 oz)   SpO2 93%   BMI 50.62 kg/m

## 2022-07-16 NOTE — Group Note (Signed)
OT Psych Group Documentation    Topics: Breakfast Bites     Occupations Addressed: Activities of Daily living, Social participation, Instrumental activities of daily living (health management & maintenance)    Skills addressed: Fine/gross motor skills, process skills, social interaction skills, behavioral control, mental functions (cognitive, perceptual, affective)           Date: 07/16/2022  Start Time: 0745  End Time: 0845    Attendance: Did not attend    Danil Wedge, OT  lic# [id number]

## 2022-07-16 NOTE — Initial Assessments (Signed)
PSYCH ADMISSION NOTE      Identifying Data:  [name] that was admitted on 07/15/22   with Patient Active Problem List:     Dyspnea on minimal exertion     Suicidal behavior with attempted self-injury ([name])     Tobacco use disorder     Systolic congestive heart failure ([name])     Seizure disorder ([name])     Schizoaffective disorder, depressive type ([place])     Suicidal ideation     Afib ([name])     Alcohol use disorder     Borderline personality disorder ([place])     Chronic pain disorder     Morbid obesity ([place])     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 ([place])     PTSD (post-traumatic stress disorder)     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     (HFpEF) heart failure with preserved ejection fraction ([name])     MDD (major depressive disorder), recurrent, severe, with psychosis ([place])     Requires continuous at home supplemental oxygen     Spinal stenosis of lumbar 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 claudication     Fall in home     Gait instability     Hypokalemia     Lung nodule     Stable angina     Chest pain, atypical     Obesity hypoventilation syndrome ([place])       Source of Information: Patient, EPIC, [name]    Chief Complaint: "That nursing home was giving me the wrong meds"     History of Present Illness (include acute symptoms, precipitants, changes in functional status, and level of distress):   As noted by the [name] yesterday:  51 year old female residing in [place] home, hx pSUD schizoaffective disorder bipolar type, PTSD,  PMHx significant for CHF, seizure disorder, hypothyroidism, ?COPD/OSA, GERD, chronic pain, spinal stenosis w/ sciatica, presenting for agitation, SIB cutting self with a razor.      Chart review:   - Admitted medically for presumed HFpEF exacerbation in May 2023  - Admitted to [facility] for worsening SI from 12/18/21-12/22/21  - Admitted to [facility] for SI/HI  from 04/09/22- 04/12/22   ED RN TRIAGE NOTE  Patient arrives in acute psychiatric distress via EMS. She lives at [facility],  and staff called due to agitation. Patient lost her son, and believes he is inhabiting her body, and trying to get out and hurt people. He is also telling her to hurt people, and she doesn't want to. She has made superficial cuts to her arms and legs in an effort to die, so she doesn't hurt others. Arrives inconsolably crying, incoherent.      ED Medicine Attending Note  Patient presents by EMS after staff noted that she was agitated and started to cut herself she believes that her child that died is it happening her body and plans to come out to kill other people.  Patient wanted to stop the child from coming out so it does not hurt anyone else.  History is limited because of the patient persistently crying and agitated.  Family history and social history noncontributory to encounter.  No known allergies.     [name] EVALUATION   On exam patient was lying on the stretcher, crying, loud.  Interview was limited due to patient being agitated, crying, speech is garble and difficult to comprehend. Patient noted that she came in because her son is insight her. She  noted that her son is telling her to cut herself open. She reports of suicidal ideation; however, she denies having any plan. Patient reporting homicidal ideation towards an identify individual name 'Kelly". She states that [place] lives at the nursing home. Patient endorses [facility], voices telling her "to cut [facility] open". Patient denies [facility]. She noted that they have not being giving her the right medications at the nursing home. However, she does not know the medications they give her. She denies all substance use. Interview was terminated at this time due to patient not participating.  Last evening pt was uncooperative with admission, refused to go over medication list with nursing staff.  Pt seen on the unit this morning.  She was irritable, but was able to be calmed with validation.  She complains that she is being overmedicated at her nursing home, thinks they are  trying to over sedate her.  We reviewed med list here in the hospital and she says the current list is accurate.  Feels safe on the unit.  No physical complaints.  Complains about the staff not being attentive enough to her.      Active Medical Problems:   Patient Active Problem List    Obesity hypoventilation syndrome [facility])         Date Noted: 04/09/2022      Chest pain, atypical         Date Noted: 04/08/2022      Stable angina         Date Noted: 04/07/2022      Lung nodule         Date Noted: 02/03/2022            12/30/2021 CT Chest: Hyperinflation with dependent            atelectasis and a 7 mm nodule in the right upper            lobe. A follow-up chest CT is recommended in 12            months per Fleischner criteria.                               Hypokalemia         Date Noted: 01/11/2022      Requires continuous at home supplemental oxygen         Date Noted: 12/23/2021      Spinal stenosis of lumbar 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 claudication         Date Noted: 12/23/2021      Fall in home         Date Noted: 12/23/2021      Gait instability         Date Noted: 12/23/2021      MDD (major depressive disorder), recurrent, severe, with psychosis ([place])      Suicidal ideation         Date Noted: 12/20/2021      Alcohol use disorder         Date Noted: 12/20/2021      Borderline personality disorder ([place])         Date Noted: 12/20/2021      (HFpEF) heart failure with preserved ejection fraction ([place])         Date Noted: 71/02/2693      Systolic congestive heart failure ([place])         Date Noted: 12/03/2021      Seizure disorder (  [place])         Date Noted: 12/03/2021      Schizoaffective disorder, depressive type [facility])         Date Noted: 12/03/2021      Tobacco use disorder         Date Noted: 12/02/2021            TTP July 2023 [place]      Suicidal behavior with attempted self-injury [facility])         Date Noted: 11/27/2021      Morbid obesity ([place])         Date Noted: 09/11/2021      Afib ([place])         Date Noted:  10/24/2019      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 )   Current/Most Recent Outpatient Care: Received care at Nursing home  Safety Concerns: SI/HI  Medication/Treatment Trials: Seroquel     Family History:  History reviewed.  No pertinent family history.      Family History of Mental Illness or Substance Abuse:Unknown    Substance Abuse:  Substance Use Screen  * Used chemicals (other than as prescribed) in the past 12 months?: No  History of Substance Use Related Symptoms: Denies past symptoms  Treatment History: None Reported              Alcohol  * Alcohol Use in past 12 months: No      Trauma History:   Abuse/Trauma History: Unable to assess    Pertinent Past Medical/Surgical History:  Past Medical History:  schizo: Bipolar affective ([place])  No date: Chronic systolic CHF (congestive heart failure) ([place])  No date: Depression  12/23/2021: Fall in home  12/23/2021: Gait instability  12/23/2021: Requires continuous at home supplemental oxygen  depress: 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 No pertinent surgical history.    Allergies  Review of Patient's Allergies indicates:   Bee venom               Anaphylaxis   Metolazone              Other (See Comments)    Comment:Hypokalemia   Methylprednisolone      Dizziness, Drowsiness    Comment:Also believes syncope. Seems to tolerate             inhalational and epidural  steroid.   Nutritional supplem*       Hydroxyzine             Nausea Only      Pre-Admission Medications:  Prior to Admission Medications   Prescriptions Last Dose Informant Patient Reported? Taking?   Budeson-Glycopyrrol-Formoterol 160-9-4.8 MCG/ACT AERO  Yes No   Sig: Inhale 2 puffs into the lungs in the morning and 2 puffs before bedtime.   FLUoxetine (PROZAC) 40 MG capsule   Yes No   Sig: Take 40 mg by mouth in the morning.   Melatonin 5 MG TABS Tablet   Yes No   Sig: Take 5 mg by mouth nightly   Multiple Vitamin (MULTIVITAMIN) TABS   Yes No   Sig: Take 1 tablet by mouth in the morning.   OTHER MEDICATION   No No   Sig: 1 each by Other route in the morning. Wheelchair: Bariatric motorized wheel chare (height 5'9", weight 328 lb, bmi 48.4)  For use as directed.    Please fax to [facility] at [phone]  DX: Spinal stenosis of lumbar 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 claudication, Gait instability, Morbid obesity ([name])  ICD10:M48.061, R26.81, E66.01.   OXcarbazepine (TRILEPTAL) 150 MG tablet   No No   Sig: Take 1 tablet by mouth in the morning and 1 tablet before bedtime.   Potassium Chloride ER 20 MEQ TBCR   Yes No   Sig: Take 20 mEq by mouth in the morning.   QUEtiapine (SEROQUEL) 100 MG tablet   No No   Sig: Take 1 tablet by mouth at bedtime   albuterol HFA 108 (90 Base) MCG/ACT inhaler   No No   Sig: Inhale 2 puffs into the lungs every 6 (six) hours as needed for Wheezing   atorvastatin (LIPITOR) 80 MG tablet   No No   Sig: Take 1 tablet by mouth in the morning.   baclofen (LIORESAL) 10 MG tablet   Yes No   Sig: Take 10 mg by mouth in the morning and 10 mg before bedtime.   bumetanide (BUMEX) 1 MG tablet   Yes No   Sig: Take 3 mg by mouth in the morning and 3 mg before bedtime.   buprenorphine-naloxone (SUBOXONE) 8-2 MG sublingual tablet   Yes No   Sig: Place 1 tablet under the tongue in the morning and 1 tablet at noon and 1 tablet before bedtime.   busPIRone (BUSPAR) 15 MG tablet   Yes No   Sig: Take 15 mg by mouth in  the morning.   fluticasone (FLONASE) 50 MCG/ACT nasal spray   Yes No   Sig: 2 sprays by Each Nostril route in the morning and 2 sprays before bedtime.   gabapentin (NEURONTIN) 300 MG capsule   No No   Sig: Take 2 capsules by mouth in the morning and 2 capsules at noon and 2 capsules before bedtime.   hydrOXYzine (ATARAX) 25 MG tablet   Yes No   Sig: Take 25 mg by mouth every 6 (six) hours as needed for Anxiety   levETIRAcetam (KEPPRA) 750 MG tablet   No No   Sig: Take 1 tablet by mouth in the morning and 1 tablet before bedtime.   levothyroxine (SYNTHROID) 25 MCG tablet   No No   Sig: Take 1 tablet by mouth every morning before breakfast   methocarbamol (ROBAXIN) 500 MG tablet   Yes No   Sig: Take 500 mg by mouth in the morning and 500 mg at noon and 500 mg before bedtime.   pantoprazole (PROTONIX) 40 MG tablet   Yes No   Sig: Take 40 mg by mouth in the morning.   polyethylene glycol (GLYCOLAX/MIRALAX) 17 g packet   Yes No   Sig: Take 17 g by mouth in the morning.   prazosin (  MINIPRESS) 1 MG capsule   No No   Sig: Take 1 capsule by mouth nightly   prazosin (MINIPRESS) 2 MG capsule   Yes No   Sig: Take 2 mg by mouth nightly   sennosides (SENOKOT) 8.6 MG tablet   Yes No   Sig: Take 17.2 mg by mouth at bedtime   spironolactone (ALDACTONE) 100 MG tablet   No No   Sig: Take 1 tablet by mouth in the morning.   thiamine (VITAMIN B-1) 100 mg tablet   Yes No   Sig: Take 100 mg by mouth in the morning.   tiotropium (SPIRIVA RESPIMAT) 2.5 MCG/ACT aerosol solution   No No   Sig: Inhale 2 puffs into the lungs in the morning.   tiotropium (SPIRIVA) 18 MCG inhalation capsule   Yes No   Sig: Inhale 18 mcg into the lungs in the morning.   torsemide (DEMADEX) 100 MG tablet   No No   Sig: Take 1 tablet by mouth in the morning.   traZODone (DESYREL) 50 MG tablet   No No   Sig: Take 1 tablet by mouth nightly as needed for Sleep      Facility-Administered Medications: None         Scheduled Medications:   lidocaine  1 patch Topical Q24H     budesonide-formoterol  2 puff Inhalation 2 times daily    atorvastatin  80 mg Oral Daily    baclofen  10 mg Oral BID    buprenorphine-naloxone  8 mg Sublingual TID    fluticasone  2 spray Each Nostril BID    gabapentin  600 mg Oral TID    levothyroxine  25 mcg Oral DAILY    OXcarbazepine  150 mg Oral BID    QUEtiapine  100 mg Oral Nightly    levETIRAcetam  750 mg Oral BID    prazosin  1 mg Oral Nightly    spironolactone  100 mg Oral Daily    tiotropium  2 puff Inhalation Daily    torsemide  100 mg Oral Daily    busPIRone  15 mg Oral TID    melatonin  3 mg Oral Nightly    methocarbamol  500 mg Oral TID    pantoprazole  40 mg Oral Daily    polyethylene glycol  17 g Oral Daily    sennosides  17.2 mg Oral Nightly    FLUoxetine  40 mg Oral Daily    thiamine  100 mg Oral Daily    nicotine  1 patch Transdermal Daily       PRN Medications:  Current Facility-Administered Medications   Medication Dose Route Frequency Last Admin    albuterol HFA  2 puff Inhalation Q6H PRN      traZODone  50 mg Oral Nightly PRN      acetaminophen  650 mg Oral Q4H PRN      magnesium hydroxide  30 mL Oral Daily PRN      aluminum-magnesium hydroxide-simethicone  30 mL Oral Q2H PRN      OLANZapine zydis  2.5 mg Oral Q6H PRN 2.5 mg at 07/16/22 0854    nicotine polacrilex  2 mg Oral Q1H PRN 2 mg at 07/16/22 1343       Psychosocial History:       Patient recently admitted to [facility] over the summer.  She has been residing at a nursing home, says it's through [facility] in [place].      Legal Decision Maker: Patient  Mental Status Exam:   Mental Status Exam   General Appearance: Dressed appropriately  Behavior: Uncooperative;Hostile;Difficult to engage  Level of Consciousness: Alert  Orientation Level: Oriented x4  Attention/Concentration: WNL  Mannerisms/Movements: No abnormal mannerisms/movements  Speech Quality and Rate: WNL  Speech Clarity: Clear  Speech Tone: Normal vocal inflection  Vocabulary/Fund of Knowledge: WNL  Memory: Intact  Thought  Process & Associations: Goal-directed  Dissociative Symptoms: None  Thought Content: Delusions  Delusions: Paranoia  Hallucinations: Auditory  Suicidal Thoughts: None  Homicidal Thoughts: None  Mood: Anxious  Affect: Constricted  Judgment: Poor  Insight: Poor    Vitals:  BP 119/79   Pulse 74   Temp 97.5 F (36.4 C) (Axillary)   Resp 18   Ht 5\' 9"  (1.753 m)   Wt (!) 155.5 kg (342 lb 12.8 oz)   SpO2 93%   BMI 50.62 kg/m     Physical Exam:  Reviewed and agree with    Violence Risk Assessment:  Violence Risk  History of Violence?: No  Current Attempt to Harm: No  Homicidal Ideation?: No  Homicidal Ideation With Intent?: No  Homicidal Ideation With Intent (Current): Denies  Homicidal Ideation With Intent (History): No  Access to Weapons: Denies  Self Destruction Behaviors: Denies  Self Inflicted Injury?: Yes  Self Inflicted Injury (Current): Yes  Self Inflicted Description (Current): scratched self on both arms superficially  Self Inflicted Injury (History): Yes  Aggression/Poor Impulse Control?: Yes  Aggression/Poor Impulse Control (Current): Yes  Aggression/Poor Impulse Control (History): Yes  Medical Conditions Restraint Risk: No  Abuse History Restraint Risk: No      DSM-V    Primary Diagnosis: Schizoaffective disorder, bipolar type ([place])            Case Formulation  Assessment/Formulation (including risk assessment): 51 year old female residing in [place] home, hx pSUD schizoaffective disorder bipolar type, PTSD,  PMHx significant for CHF, seizure disorder, hypothyroidism, ?COPD/OSA, GERD, chronic pain, spinal stenosis w/ sciatica, presenting for agitation, SIB cutting self with a razor.  Patient's presentation notable for disorganization, labile affect, agitation, paranoid delusions, thoughts of harming herself.  She seems decompensated and with prominent psychotic symptoms.  Based on available information and with reasonable medical certainty, it is my opinion that the patient presents, due to  mental illness, an imminent and substantial risk of harm to themselves or others if released to the community and there is no less restrictive placement than a locked inpatient facility. Due to persistent safety concerns, patient requires inpatient psychiatric hospitalization for safety, containment, psychopharmacological evaluation, psychosocial assessment, and aftercare coordination.       Treatment Recommendations/Rationale for Recommended Level of Care:   -Admit to [facility]  -Multidisciplinary assessment.   -Encourage participation in the therapeutic milieu, including individual and group therapy.   -Obtain further collateral information from family and any OP providers.   -Psychopharmacological assessment.   -Aftercare coordination          I have reviewed all above information with patient.

## 2022-07-16 NOTE — Plan of Care (Addendum)
Pt noted to be in better behavioral control today with much less irritability and no outbursts thus far.  Is better about things if needs are immediately met.  Active in bingo.  Ate 100% Last BM on 10/27. No s/s of Covid noted.  Spends a lot of time in room. O2 2L continuous with O2 sat at 93%.

## 2022-07-16 NOTE — H&P (Signed)
Department of Psychiatry  Admission Physical Examination, Review of Systems, & Medical Clearance    HPI: 64F w/ hx of HFpEF, afib,TUD, seizure d/o on levetiracetam, obesity hypoventilation syndrome, schizoaffective d/o with recent hospitalizations for SI presenting for agitation, SI and HI. She cut herself after hearing her son's voice to cut herself open. Her HI is towards people with name "kelly".     Medically, she has been hospitalized in 03/2022 for stable angina, and 12/2021 for HFpEF exacerbation. Dry weight is not well documented, however appears that she was discharged from her HFpEF hospitalization at weight of 352#. Last documented diuretics are torsemide 80 qd-BID and spironolactone 50mg  however on the most recent dispensing report lists bumetanide 3mg  BID and spironolactone 100mg  qd without documentation of why meds were changed. She carries dx of afib but not on any rate controller.     Today, patient complains of pain all over and reports that her nursing home has decreased her 3 of home pain med doses (gabapentin, methocarbamol, and suboxone) without an explanation. Per chart review, however, her doses of gabapentin and suboxone do not appear changed at least as of 12/2021. Also notes that she has as particular pain under her L shoulder blade that is intermittent, not worsening, and not worsening with movement. Denies numbness/tingling or weakness of LUE.     Otherwise denies fever, chills, CP, SOB, nausea, vomiting, constipation or diarrhea today.       Social History    Social History Narrative      Not on file      History reviewed.  No pertinent family history.      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     (HFpEF) heart failure with preserved ejection fraction ([name])     MDD (major depressive disorder), recurrent, severe, with psychosis ([name])     Requires continuous at home supplemental oxygen     Spinal stenosis of lumbar 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 claudication     Fall in home     Gait instability     Hypokalemia     Lung nodule     Stable angina     Chest pain, atypical     Obesity hypoventilation syndrome ([name])      Prior to Admission Medications:  baclofen (LIORESAL) 10 MG tablet, Take 10 mg by mouth in the morning and 10 mg before bedtime., Disp: , Rfl:   Budeson-Glycopyrrol-Formoterol 160-9-4.8 MCG/ACT AERO, Inhale 2 puffs into the lungs in the morning and 2 puffs before bedtime., Disp: , Rfl:   bumetanide (BUMEX) 1 MG tablet, Take 3 mg by mouth in the morning and 3 mg before bedtime., Disp: , Rfl:   buprenorphine-naloxone (SUBOXONE) 8-2 MG sublingual tablet, Place 1 tablet under the tongue in the morning and 1 tablet at noon and 1 tablet before  bedtime., Disp: , Rfl:   busPIRone (BUSPAR) 15 MG tablet, Take 15 mg by mouth in the morning., Disp: , Rfl:   fluticasone (FLONASE) 50 MCG/ACT nasal spray, 2 sprays by Each Nostril route in the morning and 2 sprays before bedtime., Disp: , Rfl:   FLUoxetine (PROZAC) 40 MG capsule, Take 40 mg by mouth in the morning., Disp: , Rfl:   hydrOXYzine (ATARAX) 25 MG tablet, Take 25 mg by mouth every 6 (six) hours as needed for Anxiety, Disp: , Rfl:   Melatonin 5 MG TABS Tablet, Take 5 mg by mouth nightly, Disp: , Rfl:   methocarbamol (ROBAXIN) 500 MG tablet, Take 500 mg by mouth in the morning and 500 mg at noon and 500 mg before bedtime., Disp: , Rfl:   Multiple Vitamin (MULTIVITAMIN) TABS, Take 1 tablet by mouth in the morning., Disp: , Rfl:   pantoprazole  (PROTONIX) 40 MG tablet, Take 40 mg by mouth in the morning., Disp: , Rfl:   polyethylene glycol (GLYCOLAX/MIRALAX) 17 g packet, Take 17 g by mouth in the morning., Disp: , Rfl:   Potassium Chloride ER 20 MEQ TBCR, Take 20 mEq by mouth in the morning., Disp: , Rfl:   prazosin (MINIPRESS) 2 MG capsule, Take 2 mg by mouth nightly, Disp: , Rfl:   sennosides (SENOKOT) 8.6 MG tablet, Take 17.2 mg by mouth at bedtime, Disp: , Rfl:   thiamine (VITAMIN B-1) 100 mg tablet, Take 100 mg by mouth in the morning., Disp: , Rfl:   tiotropium (SPIRIVA) 18 MCG inhalation capsule, Inhale 18 mcg into the lungs in the morning., Disp: , Rfl:   OTHER MEDICATION, 1 each by Other route in the morning. Wheelchair: Bariatric motorized wheel chare (height 5'9", weight 328 lb, bmi 48.4)  For use as directed.    Please fax to [facility] at [phone]  DX: Spinal stenosis of lumbar 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 claudication, Gait instability, Morbid obesity ([name])  ICD10:M48.061, R26.81, E66.01., Disp: 1 each, Rfl: 0  atorvastatin (LIPITOR) 80 MG tablet, Take 1 tablet by mouth in the morning., Disp: 30 tablet, Rfl: 0  gabapentin (NEURONTIN) 300 MG capsule, Take 2 capsules by mouth in the morning and 2 capsules at noon and 2 capsules before bedtime., Disp: 180 capsule, Rfl: 0  levETIRAcetam (KEPPRA) 750 MG tablet, Take 1 tablet by mouth in the morning and 1 tablet before bedtime., Disp: 60 tablet, Rfl: 0  levothyroxine (SYNTHROID) 25 MCG tablet, Take 1 tablet by mouth every morning before breakfast, Disp: 30 tablet, Rfl: 0  OXcarbazepine (TRILEPTAL) 150 MG tablet, Take 1 tablet by mouth in the morning and 1 tablet before bedtime., Disp: 60 tablet, Rfl: 0  prazosin (MINIPRESS) 1 MG capsule, Take 1 capsule by mouth nightly, Disp: 30 capsule, Rfl: 0  QUEtiapine (SEROQUEL) 100 MG tablet, Take 1 tablet by mouth at bedtime, Disp: 30 tablet, Rfl: 0  spironolactone (ALDACTONE) 100 MG tablet, Take 1 tablet by mouth in the morning., Disp: 30 tablet, Rfl:  0  tiotropium (SPIRIVA RESPIMAT) 2.5 MCG/ACT aerosol solution, Inhale 2 puffs into the lungs in the morning., Disp: 1 each, Rfl: 0  torsemide (DEMADEX) 100 MG tablet, Take 1 tablet by mouth in the morning., Disp: 30 tablet, Rfl: 0  traZODone (DESYREL) 50 MG tablet, Take 1 tablet by mouth nightly as needed for Sleep, Disp: 30 tablet, Rfl: 0  albuterol HFA 108 (90 Base) MCG/ACT inhaler, Inhale 2 puffs into the lungs every 6 (six) hours as needed for Wheezing, Disp: 1 each,  Rfl: 0        Scheduled Medications:   atorvastatin  80 mg Oral Daily    baclofen  10 mg Oral BID    buprenorphine-naloxone  8 mg Sublingual TID    fluticasone  2 spray Each Nostril BID    gabapentin  600 mg Oral TID    levothyroxine  25 mcg Oral DAILY    OXcarbazepine  150 mg Oral BID    QUEtiapine  100 mg Oral Nightly    levETIRAcetam  750 mg Oral BID    prazosin  1 mg Oral Nightly    spironolactone  100 mg Oral Daily    tiotropium  2 puff Inhalation Daily    torsemide  100 mg Oral Daily    busPIRone  15 mg Oral TID    melatonin  3 mg Oral Nightly    methocarbamol  500 mg Oral TID    pantoprazole  40 mg Oral Daily    polyethylene glycol  17 g Oral Daily    sennosides  17.2 mg Oral Nightly    FLUoxetine  40 mg Oral Daily    thiamine  100 mg Oral Daily    nicotine  1 patch Transdermal Daily       PRN Medications:  Current Facility-Administered Medications   Medication Dose Route Frequency Last Admin    albuterol HFA  2 puff Inhalation Q6H PRN      traZODone  50 mg Oral Nightly PRN      acetaminophen  650 mg Oral Q4H PRN      magnesium hydroxide  30 mL Oral Daily PRN      aluminum-magnesium hydroxide-simethicone  30 mL Oral Q2H PRN      OLANZapine zydis  2.5 mg Oral Q6H PRN 2.5 mg at 07/16/22 0854    nicotine polacrilex  2 mg Oral Q1H PRN 2 mg at 07/16/22 0851       ALLERGIES:   Review of Patient's Allergies indicates:   Bee venom               Anaphylaxis   Metolazone              Other (See Comments)    Comment:Hypokalemia   Methylprednisolone       Dizziness, Drowsiness    Comment:Also believes syncope. Seems to tolerate             inhalational and epidural steroid.   Nutritional supplem*       Hydroxyzine             Nausea Only    REVIEW OF SYSTEMS:  See above    PHYSICAL EXAM:  BP 119/79   Pulse 74   Temp 97.5 F (36.4 C) (Axillary)   Resp 18   Ht 5\' 9"  (1.753 m)   Wt (!) 155.5 kg (342 lb 12.8 oz)   SpO2 93%   BMI 50.62 kg/m   Pain Score: 10 (10/10)      General: well-nourished, no apparent distress, obese  HEENT: PERRLA and extra ocular movement intact  Chest: no tenderness  Lungs: aeration throughout lung fields, no wheezes. Faint crackles on R lung base.   Heart: normal and regular rate and rhythm  Abdomen: soft, non-tender. Bowel sounds normal. No masses,  no organomegaly  Genital: defer exam  Rectal: deferred  Extremities: edema mild pitting edema up to mid shin b/l and extremities normal, atraumatic, no cyanosis or edema  Skin: horizontal cut marks with scabs with no  surrounding erythema on b/l shins   Neurological Screen:   Cranial Nerves:   I: smell Not tested   II: visual acuity  Can read from 20 inches   II: visual fields Full to confrontation   II: pupils Equal, round, reactive to light   III,VII: ptosis None   III,IV,VI: extraocular muscles  Full ROM   V: mastication Normal   V: facial light touch sensation  Normal   V,VII: corneal reflex  Present   VII: facial muscle function - upper  Normal   VII: facial muscle function - lower Normal   VIII: hearing Not tested   IX: soft palate elevation  Normal   IX,X: gag reflex Present   XI: trapezius strength  5/5   XI: sternocleidomastoid strength 5/5   XI: neck flexion strength  5/5   XII: tongue strength  Normal     Strength: Gait with rolling walker. No obvious asymmetry, decrease in ROM, instability or weakness of spine or extremities.  Sensation/Position/Pain: normal to touch      Test results:  Chemistries:  Recent Labs     07/15/22  0334   NA 141   K 3.8   CL 99   CO2 30   ANION 12   BUN  10   CREAT 0.8   GFR > 60   GLUCOSER 100   CA 9.2    GI:  No results for input(s): AST, ALT, TBILI, DBILI, IBIL, ALKPHOS, GGTP, ALBUMIN, LIP, AMY in the last 72 hours.   CBC:  Recent Labs     07/15/22  0334   WBC 8.2   HGB 14.0   HCT 44.1   RBC 4.70   MCV 93.8   PLTA 280   ABSNEUT 4.2    Coagulation:   No results for input(s): INR, PT, PTT, APTT, HPTT, FIB in the last 72 hours.   Trops, BNP, D-dimer, Lactate, C-RP, Procal, CK:  No results for input(s): TROPI, PROBNP, DDPEDVT, DD, LACTICACID, CRP, PROCALCITONI, CKTOTAL in the last 72 hours.    Finger Sticks:  No results for input(s): FINGERSTICKR in the last 72 hours. Urinalysis:  No results for input(s): UACOL, UACLA, UAGLU, UABIL, UAKET, SPEGRAVURINE, UAOCC, UAPH, UAPRO, [place], LEUKOCYTES, UAMIC, UAWBC, [place], [place], [place], [place], [place], [place] in the last 72 hours.         Primary Care Physician:  Cora Collum, MD    Findings & Recommendations:  43F w/ HFpEF, Afib, COPD, PSUD and schizoaffective presenting with SI/HI. Currently does not have acute medical needs and can continue on her home medications.     #HFpEF  #stable angina  #HLD  Dry weight is not well established but currently appears euvolemic on home level of oxygen/ intermittent room air satting >88% and exam reassuring. Dosing of home diuretics is not clear- torsemide 80mg  QD/BID + spironolactone 50mg  from last hospitalization vs bumex 3mg  BID  + spironolactone 100mg  on most recent home med list. Torsemide 100mg  that is currently ordered by primary team, is in between the last known dose of 80mg  and 120 (equivalent to bumex 6mg  total daily), so would be reasonable to continue that for now.   -would repeat electrolytes to ensure K>4 and Mg>2 on current regimen (ordered for 10/29 AM)  -continue torsemide 100mg  qd  -continue spironolactone 100mg  qd  -continue home atorvastatin 80mg    -continue aspirin 81mg   - transitional: would benefit from being on on SGLT2i    #COPD  Satting well on home supp O2.    -  continue home tiotropium 2 puff daily  -continue home symbicort 2 puffs BID (ordered)  -prn albuterol     #Afib?  Carries this diagnosis however not on any rate controller nor anticoagulation (CHADSVASc score 4). Heart rate is normal and regular today.   -periodically check rhythm  -would make sure K>4 and Mg>2  -transitional: may benefit from cardiology follow up outpatient     #L shoulder blade pain  Likely MSK given reproducibility to palpation. No neuro involvement given no numbness/tingling/weakness.   -lidocaine patch qd    #prediabetes  A1C uptrending from 6-> 6.5%  -carb controlled diet (ordered)  -transitional: need repeat A1C in 3 months     #hypothyrodism  -f/u repeat TSH/T4  -continue levothyroxine 25mcg qd for now    #chronic pain: continue home gabapentin, baclofen, methocarbamol    #seizure d/o: continue home keppra 750mg  BID, oxacarbazepine 150mg  BID    #GERD: continue home pantoprazole 40mg  qam    #PSUD: continue home suboxone     #Constipation: continue bowel reg    #depression  #Schizoaffective  #SI/HI  -care per psych        Discussed with Dr. Berneda RoseMoran.     The patient has been examined, and the chart has been reviewed,  with findings as documented above.  The patient is medically stable and cleared for admission.  The patient may participate in exercise while in the hospital.    Evelena PeatSunny Burrel Legrand, MD  PGY-3 Internal Medicine  [phone]  07/16/22

## 2022-07-16 NOTE — Plan of Care (Signed)
Patient received in bed resting at change of shift, irritable on approach, yelling out loud, 1:1 emotional nursing support provided, along with warm blanket for comfort, continues on oxygen therapy saturation 95% on 2L of oxygen, she denies pain, SOB, out of bed to void this morning, compliant with schedule synthroid, R/Rec'd prn nicorette gum at 0557, patient patient stable in no acute distress, slept 6.2 hrs, LBM 1025/23 denies constipation. Safety precaution maintained, will monitor.

## 2022-07-17 NOTE — Plan of Care (Addendum)
Pt noted to be relatively quiet though has concerns over her 02 sat which is 91 to 93% on 2L. No obvious distress noted.  Called MD and bumped up to 3 L continuous. Given Has Symbicort. In bed all shift watching movies on tablet.  No groups. Ate 70%.  Last BM on 10/27. No s/s of Covid noted. Has been a bit needy but pleasant with RN.

## 2022-07-17 NOTE — Plan of Care (Signed)
Patient received in bed resting at change of shift, continues on oxygen therapy saturation 95% on 2L of oxygen, she denies pain, SOB, R/Rec'd prn nicorette gum at 0543 along with schedule Schedule medication. Patient stable in no acute distress, slept 8.7 hrs, LBM 07/15/22. Safety precaution maintained, will monitor.

## 2022-07-17 NOTE — Progress Notes (Addendum)
Patient remains in her room, ambulates with rolling walker, calm and cooperative, less irritable. He took medications as prescribed including nicorette gums for craving, refused Miralax. She ate 100% of her lunch, adequate po fluids intake. She Continues on oxygen therapy, O2 sat 93% on 2L, no SOB reported. Denies SI/HI/AH/VH. No safety concerns. Assisted as needed. Scratches to bilateral arms dry, no drainage. Declined groups, watching movies in [place] tablet.  BP 110/88   Pulse 68   Temp 97.5 F (36.4 C)   Resp 18   Ht _0  (1.753 m)   Wt (!) 155.5 kg (342 lb 12.8 oz)   SpO2 93%   BMI 50.62 kg/m      Patient was complaining of chest pressure in the afternoon. VSS as recorded. MD notified EKG ordered,  she met with MD and refused the EKG, Mylanta offered for possible GERD, she also refused, stated this is not new, currently eating in room and watching movies.    BP 116/74   Pulse 89   Temp 97 F (36.1 C)   Resp 18   Ht _1  (1.753 m)   Wt (!) 155.5 kg (342 lb 12.8 oz)   SpO2 92%   BMI 50.62 kg/m

## 2022-07-17 NOTE — Progress Notes (Signed)
Identifying Data:  [name] that was admitted on 07/15/22       Legal Status: Legal Status - Section 12    INTERVAL HISTORY:   Patient Active Problem List:     Dyspnea on minimal exertion     Suicidal behavior with attempted self-injury ([place])     Tobacco use disorder     Systolic congestive heart failure ([name])     Seizure disorder ([name])     Schizoaffective disorder, depressive type ([place])     Suicidal ideation     Afib ([name])     Alcohol use disorder     Borderline personality disorder ([place])     Chronic pain disorder     Morbid obesity ([place])     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 ([place])     PTSD (post-traumatic stress disorder)     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     No overnight events, Medication compliance Yes, Sleep is good, Appetite is good, No significant nursing concerns    Pt was complaining of chest pressure this afternoon.  Refused EKG.  Refused mylanta for possible GERD.  Says the pain is not new, related to her breathing.  VSS.      VITALS:  BP 116/74   Pulse 89   Temp 97 F (36.1 C)   Resp 18   Ht 5\' 9"  (1.753 m)   Wt (!) 155.5 kg (342 lb 12.8 oz)   SpO2 92%   BMI 50.62 kg/m    Pain Score: 9 (9/10)    Scheduled Meds:   lidocaine  1 patch Topical Q24H    budesonide-formoterol  2 puff Inhalation 2 times daily    atorvastatin  80 mg Oral Daily    baclofen  10 mg Oral BID    buprenorphine-naloxone  8 mg Sublingual TID    fluticasone  2 spray Each Nostril BID    gabapentin  600 mg  Oral TID    levothyroxine  25 mcg Oral DAILY    OXcarbazepine  150 mg Oral BID    QUEtiapine  100 mg Oral Nightly    levETIRAcetam  750 mg Oral BID    prazosin  1 mg Oral Nightly    spironolactone  100 mg Oral Daily    tiotropium  2 puff Inhalation Daily    torsemide  100 mg Oral Daily    busPIRone  15 mg Oral TID    melatonin  3 mg Oral Nightly    methocarbamol  500 mg Oral TID    pantoprazole  40 mg Oral Daily    polyethylene glycol  17 g Oral Daily    sennosides  17.2 mg Oral Nightly    FLUoxetine  40 mg Oral Daily    thiamine  100 mg Oral Daily    nicotine  1 patch  Transdermal Daily       PRN Meds:  Current Facility-Administered Medications   Medication Dose Route Frequency Last Admin    albuterol HFA  2 puff Inhalation Q6H PRN      traZODone  50 mg Oral Nightly PRN      acetaminophen  650 mg Oral Q4H PRN 650 mg at 07/17/22 0851    magnesium hydroxide  30 mL Oral Daily PRN      aluminum-magnesium hydroxide-simethicone  30 mL Oral Q2H PRN      OLANZapine zydis  2.5 mg Oral Q6H PRN 2.5 mg at 07/16/22 0854    nicotine polacrilex  2 mg Oral Q1H PRN 2 mg at 07/17/22 1330       Mental Status Examination Notable Findings:  Mental Status Exam   General Appearance: Dressed appropriately  Behavior: Cooperative  Level of Consciousness: Alert  Orientation Level: Oriented x4  Attention/Concentration: WNL  Mannerisms/Movements: No abnormal mannerisms/movements  Speech Quality and Rate: WNL  Speech Clarity: Clear  Speech Tone: Normal vocal inflection  Vocabulary/Fund of Knowledge: WNL  Memory: Intact  Thought Process & Associations: Goal-directed  Dissociative Symptoms: None  Thought Content: Delusions  Delusions: Paranoia  Hallucinations: Auditory  Suicidal Thoughts: None  Homicidal Thoughts: None  Mood: Anxious  Affect: Constricted  Judgment: Poor  Insight: Poor      C-SSRS:  Default Flowsheet Data (most recent)       C-SSRS Frequent Screener - 07/17/22 0900          C-SSRS Frequent Screener    Since last contact, have you  had thoughts about killing yourself? No     Have you done anything, started to do anything, or prepared to do anything to end your life?  No     C-SSRS Frequent Screener Risk Score No Risk                         Allergies:  Review of Patient's Allergies indicates:   Bee venom               Anaphylaxis   Metolazone              Other (See Comments)    Comment:Hypokalemia   Methylprednisolone      Dizziness, Drowsiness    Comment:Also believes syncope. Seems to tolerate             inhalational and epidural steroid.   Nutritional supplem*       Hydroxyzine             Nausea Only      DSM-V    Primary Diagnosis: Schizoaffective disorder, bipolar type [facility])          ASSESSMENT:   Continue treatment plan as per treatment team

## 2022-07-18 ENCOUNTER — Encounter (HOSPITAL_BASED_OUTPATIENT_CLINIC_OR_DEPARTMENT_OTHER): Payer: Self-pay

## 2022-07-18 MED ORDER — DICLOFENAC SODIUM 1 % EX GEL
2.0000 g | Freq: Four times a day (QID) | CUTANEOUS | Status: DC | PRN
Start: 2022-07-18 — End: 2022-07-20
  Filled 2022-07-18: qty 50

## 2022-07-18 MED ORDER — NYSTATIN 100000 UNIT/GM EX POWD
Freq: Two times a day (BID) | CUTANEOUS | Status: DC
Start: 2022-07-18 — End: 2022-07-20
  Filled 2022-07-18 (×2): qty 30

## 2022-07-18 MED ORDER — BUSPIRONE HCL 10 MG PO TABS
20.0000 mg | ORAL_TABLET | Freq: Three times a day (TID) | ORAL | Status: DC
Start: 2022-07-18 — End: 2022-07-20
  Administered 2022-07-18 – 2022-07-20 (×7): 20 mg via ORAL
  Filled 2022-07-18 (×7): qty 2

## 2022-07-18 MED ORDER — OLANZAPINE 5 MG PO TBDP
2.5000 mg | ORAL_TABLET | Freq: Four times a day (QID) | ORAL | Status: DC | PRN
Start: 2022-07-18 — End: 2022-07-20
  Administered 2022-07-19 – 2022-07-20 (×3): 2.5 mg via ORAL
  Filled 2022-07-18 (×4): qty 1

## 2022-07-18 MED ORDER — TRAZODONE HCL 50 MG PO TABS
50.0000 mg | ORAL_TABLET | Freq: Every evening | ORAL | Status: DC | PRN
Start: 2022-07-18 — End: 2022-07-20

## 2022-07-18 MED ORDER — MIRTAZAPINE 7.5 MG PO TABS
7.5000 mg | ORAL_TABLET | Freq: Every evening | ORAL | Status: DC
Start: 2022-07-18 — End: 2022-07-20
  Administered 2022-07-18 – 2022-07-19 (×2): 7.5 mg via ORAL
  Filled 2022-07-18 (×2): qty 1

## 2022-07-18 NOTE — Plan of Care (Signed)
Patient received in bed resting at change of shift in no acute distress, continues on oxygen therapy saturation 97% on 3 L of oxygen, she denies pain, SOB, R/Rec'd prn nicorette gum at 0540 along with schedule Synthroid. Patient stable in no acute distress, slept 8.6 hrs, LBM 07/15/22. Safety precaution maintained, patient to continue on 5 minute safety checks a/o.

## 2022-07-18 NOTE — Multidisciplinary Treatment Plan of Care (Signed)
Written Treatment Plan Progress  Percy    Admission date: 07/15/22      Goal Progress/Outcomes       Pattie's individual and/or guardian stated goal: feel better  Amamda/guardian reports goal NOT progressing as of today. The team will continue to assist the patient with the progression of their goal.    Team goal: decrease psychotic symptoms and distress  Team agrees goal NOT progressing as of today. The team will continue to assist the patient with the progression of their goal.     Care Plan Problems/Goals       0 of 3 Goals Met 0 of 3 Met       Progressing (1)        Demonstrates/reports improvement in mood stability by discharge (Mood, Altered)                       No Outcome (2)        Risk control - tobacco abuse (Tobacco/Nicotine Abuse)      Patients at risk for falling will be known to staff (Fall risk identification/communication (REQUIRED))                          Additional Patient-Centered Interventions       To support the Goals outlined above, the team will:    Physician  use evidenced-based psychopharmacology  provide psychoeducation about patient's illness, symptoms  daily assessment and evaluation for diagnostic clarification    Social Work  assist patient with referrals before discharge  facilitate family meetings as needed  coordinate discharge throughout hospitalization       Rehabilitation Therapies  OT will Provide psycho-education in a group and/or in an individual session for: symptom management, stress management, anger management, coping skills, communication skills, self awareness, SBT, and CBT/DBT at least once per week during hospitalization.  Provide education and assessment around sensory preferences/sensitivities.  Provide education, assessment, and intervention surrounding ADLs/IADLs as needed.  Assist in developing self-regulation skills through activity participation and practice using sensory tools at least once per week during  hospitalization.

## 2022-07-18 NOTE — Progress Notes (Signed)
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     Legal Status: Conditional Voluntary (section 10/11)    INTERVAL HISTORY:   Hospital Day: 4    The patient was seen, chart reviewed, discussed in multidisciplinary team.    Interval events: no issues overnight, on 3L O2 for sleep saturating 97%    Meals       Date/Time Percent Meals Eaten (%)    07/17/22 1800 70 %    07/18/22 1049 0 %    07/18/22 1400 100 %            Meds: compliant     Groups:    Sleep: 8.6     Elimination:   Last BM Date: 07/18/22  Toileting: Independent  Urinary incontinence: No  Fecal incontinence: No    PRN: PRN tylenol and nicotine gum used    On interview, patient meets weekday treatment team for first time. She shares that she has no recollection of what happened that brought her into the hospital. She states that it does sound like her to ask for help - however she has no desire to  harm herself or other people. She does state that she has conflict with peer Tresa Endo at her current facility, however if Tresa Endo were to bother patient again she states she would call the police. No intention or desire to physically  harm Tresa Endo. She denies plan or intention to harm herself. She states she has scratches on her arms but does not recall hurting herself - on exam does appear to have superficial scabbed scratch marks on both forearms. She denies current SI/HI/AVH, denies legal problems, denies access to guns.     She states that she would like the most help at this time with sleep. She states none of the usual medications have worked for her, including numerous antipsychotics that we list out. She describes having hives to trazodone - although as recently as three months ago she has taken trazodone in this hospital with good effect (and no report of allergic reaction). She states that she would like to  be back on clonazepam 3mg  daily. She states that the only medication that helped her sleep was chloral hydrate.     She is agreeable to a re-trial of mirtazapine for sleep, and a mild increase in buspirone for anxiety. She does not wish to be on quetiapine, so we will stop it (since she describes no benefit from it).     Patient states she has been substance free since July. She states that she smokes 3ppd cigarettes right now to cover for not using cocaine anymore. She requests we call [name] to tell them to secure her belongings.     Called [name] [phone]) however no one was available to pick up. Will try again tomorrow morning.       Physical/Somatic Complaints  The patient voices: Does not verbalize complaints   On general review, endorses no general malaise, no fever/chills, no SOB/CP. No report of dizziness/weakness/numbness. No changes to bowel or bladder habits.  No generalized or localized pain. Cranial nerves grossly intact.    See interval hx, assessment and plan for  any other details not specified here.    Allergies:  Review of Patient's Allergies indicates:   Bee venom               Anaphylaxis   Metolazone              Other (See Comments)    Comment:Hypokalemia   Methylprednisolone      Dizziness, Drowsiness    Comment:Also believes syncope. Seems to tolerate             inhalational and epidural steroid.   Nutritional supplem*       Hydroxyzine             Nausea Only    Scheduled Meds:  Current Facility-Administered Medications   Medication Dose Route Frequency Last Admin    busPIRone (BUSPAR) tablet 20 mg  20 mg Oral TID 20 mg at 07/18/22 1329    mirtazapine (REMERON) tablet 7.5 mg  7.5 mg Oral Nightly      nystatin (MYCOSTATIN) powder   Topical BID      lidocaine (SALONPAS) 4 % patch 1 patch  1 patch Topical Q24H 1 patch at 07/18/22 0902    budesonide-formoterol (SYMBICORT) 160-4.5 MCG/ACT inhaler 2 puff  2 puff Inhalation 2 times daily 2 puff at 07/18/22 0918    atorvastatin (LIPITOR) tablet 80 mg  80 mg Oral Daily 80 mg at 07/18/22 0900    baclofen (LIORESAL) tablet 10 mg  10 mg Oral BID 10 mg at 07/18/22 0900    buprenorphine-naloxone (SUBOXONE) 8-2 MG SL tablet 1 tablet  8 mg Sublingual TID 1 tablet at 07/18/22 1329    fluticasone (FLONASE) 50 MCG/ACT nasal spray 2 spray  2 spray Each Nostril BID 2 spray at 07/18/22 0918    gabapentin (NEURONTIN) capsule 600 mg  600 mg Oral TID 600 mg at 07/18/22 1329    levothyroxine (SYNTHROID) tablet 25 mcg  25 mcg Oral DAILY 25 mcg at 07/18/22 0540    OXcarbazepine (TRILEPTAL) tablet 150 mg  150 mg Oral BID 150 mg at 07/18/22 0900    levETIRAcetam (KEPPRA) tablet 750 mg  750 mg Oral BID 750 mg at 07/18/22 0900    prazosin (MINIPRESS) capsule 1 mg  1 mg Oral Nightly 1 mg at 07/17/22 1945    spironolactone (ALDACTONE) tablet 100 mg  100 mg Oral Daily 100 mg at 07/18/22 0900    tiotropium (SPIRIVA RESPIMAT)  inhaler 2 puff  2 puff Inhalation Daily 2 puff at 07/18/22 0900    torsemide (DEMADEX) tablet 100 mg  100 mg Oral Daily 100 mg  at 07/18/22 0900    melatonin tablet 3 mg  3 mg Oral Nightly 3 mg at 07/17/22 1943    methocarbamol (ROBAXIN) tablet 500 mg  500 mg Oral TID 500 mg at 07/18/22 1329    pantoprazole (PROTONIX) EC tablet 40 mg  40 mg Oral Daily 40 mg at 07/18/22 0900    polyethylene glycol (GLYCOLAX/MIRALAX) packet 17 g  17 g Oral Daily 17 g at 07/18/22 0900    sennosides (SENOKOT) tablet 17.2 mg  17.2 mg Oral Nightly 17.2 mg at 07/17/22 1945    FLUoxetine (PROzac) capsule 40 mg  40 mg Oral Daily 40 mg at 07/18/22 0900    thiamine (VITAMIN B-1) tablet 100 mg  100 mg Oral Daily 100 mg at 07/18/22 0900    nicotine (NICODERM CQ) 21 MG/24HR 1 patch  1 patch Transdermal Daily 1 patch at 07/18/22 0900       PRN Meds:  Current Facility-Administered Medications   Medication Dose Route Frequency Last Admin    diclofenac  2 g Topical 4x Daily PRN      OLANZapine zydis  2.5 mg Oral Q6H PRN      albuterol HFA  2 puff Inhalation Q6H PRN      acetaminophen  650 mg Oral Q4H PRN 650 mg at 07/18/22 1033    magnesium hydroxide  30 mL Oral Daily PRN      aluminum-magnesium hydroxide-simethicone  30 mL Oral Q2H PRN      nicotine polacrilex  2 mg Oral Q1H PRN 2 mg at 07/18/22 1346        VITALS:   07/17/22  1947 07/18/22  0927 07/18/22  1329 07/18/22  1552   BP:  120/84  99/62   Pulse:  77  81   Resp: 18 18 18     Temp:  97.5 F (36.4 C)  96.6 F (35.9 C)   TempSrc:  Temporal     SpO2:  93%  93%   Weight:       Height:            Recent Labs:  BMP: No results for input(s): NA, K, CL, CO2, BUN, CREAT, GLUCOSER in the last 72 hours.  Ca, Mg, Phos: No results for input(s): CA, MG, PHOS in the last 72 hours.  CBC: No results for input(s): WBC, HCT, PLTA, RBC in the last 72 hours.  Coagulation Labs: No results for input(s): PT, PTT, INR in the last 72 hours.  LFTs: No results for input(s): AST, ALT, TBILI, DBILI, ALKPHOS in the last 72 hours.  Pancreatic Enzymes: No results for input(s): AMY, LIP in the last 72 hours.  Urinalysis:No results for input(s):  UACOL, UACLA, UAGLU, UABIL, UAKET, SPEGRAVURINE, UAOCC, UAPH, UAPRO, UANIT, LEUKOCYTES in the last 72 hours.    Invalid input(s): UAOBU  Troponin: No results for input(s): TROPI in the last 72 hours.    Depakote level:  No results found for: LITHIUM        VITAMIN B12 (pg/mL)   Date Value   12/22/2021 287         FOLATE (ng/mL)   Date Value   12/22/2021 19.4         TSH (THYROID STIM HORMONE) (uIU/mL)   Date Value   03/23/2022 0.636       No results found  for: [facility]      Metabolic monitoring  HEMOGLOBIN A1C (%)   Date Value   07/15/2022 6.5 (H)   12/03/2021 6.0 (H)     HEMOGLOBIN A1C CARE EVERYWHERE (%)   Date Value   08/26/2021 5.5   06/23/2020 5.8 (H)   03/04/2020 5.4       No results found for: POCA1C    Cholesterol (mg/dL)   Date Value   09/81/191407/23/2023 263 (H)   12/03/2021 187     LOW DENSITY LIPOPROTEIN DIRECT (mg/dL)   Date Value   78/29/562107/23/2023 192 (H)   12/03/2021 111     HIGH DENSITY LIPOPROTEIN (mg/dL)   Date Value   30/86/578407/23/2023 40   12/03/2021 39 (L)     TRIGLYCERIDES (mg/dL)   Date Value   69/62/952807/23/2023 301 (H)   12/03/2021 288 (H)            Imaging:  3/19 CT head w/o contrast   Brain: Gray-white differentiation is maintained. There is no hemorrhage or abnormal mass effect.     Ventricles and CSF spaces: Normal in size and morphology.     Sinuses: There is mild mucoperiosteal thickening of the left frontal sinus.     Mastoid air cells: Clear.     Orbits: Unremarkable.     Bones: The calvarial vault and skull base are intact.     Extracranial: Unremarkable     IMPRESSION:      No CT evidence of acute intracranial abnormality.    EKG:  Refused EKG so far this admission     CSSRS:  Default Flowsheet Data (most recent)       C-SSRS Frequent Screener - 07/18/22 1049          C-SSRS Frequent Screener    Since last contact, have you had thoughts about killing yourself? No     Have you done anything, started to do anything, or prepared to do anything to end your life?  No     C-SSRS Frequent Screener Risk Score No Risk                      Mental Status Exam:  Mental Status Exam   General Appearance: Dressed appropriately  Behavior: Cooperative  Level of Consciousness: Alert  Orientation Level: Grossly Intact  Attention/Concentration: WNL  Mannerisms/Movements: No abnormal mannerisms/movements  Speech Quality and Rate: WNL  Speech Clarity: Clear  Speech Tone: Loud  Vocabulary/Fund of Knowledge: WNL  Memory:  (does not recall precipitating events for hospitalization but otherwise wnl)  Thought Process & Associations: Goal-directed  Dissociative Symptoms: None  Thought Content: No abnormalities reported or observed  Delusions: None  Hallucinations: None  Suicidal Thoughts: None  Homicidal Thoughts: None  Mood: Euthymic  * Mood comment: "when are you going to send me back"  Affect: Labile  Judgment: Fair  Insight: Poor     DSM5 DIAGNOSIS  DSM-V    Primary Diagnosis: Schizoaffective disorder, bipolar type ([name])        FORMULATION/ASSESSMENT:   Cheryl Zimmerman is a 51yo woman with schizoaffective d/o bipolar type, PTSD, h/ past SUD (etoh, cocaine), medical history of CHF, seizure d/o, hypothyroidism, COPD, GERD, chronic pain, spinal stenosis, who presents with an episode of distress and SIB cutting with razor. Given the high emotionality and distress of what brought her into the hospital (believing her son was in her body telling her to harm others, CAH) and her current inability to remember the episode and appropriate presentation -  do believe that this could represent a dissociative episode consisting of emotional lability, in reaction to historical trauma and poor coping skills. This affective lability and clinical history/poor psychotropic treatment response is suggestive of borderline personality disorder, for which she has a historical diagnosis. There does not seem to be an active psychosis or decompensated mood episode currently. Suspect that psychiatric medications will not make a meaningful improvement to her current  presentation and she will need to engage in long-term supportive housing, therapy, and psychosocial supports to stay stable.     Today, she is interested in returning to [name]. She has many requests around medications and privileges on the unit. Discussed that there are limitations to medications to her needs, including for sleep and current distress - but will make some reasonable and safe adjustments to see if we can obtain benefit. Will likely discharge back to [facility] later this week presuming she can return.     Would avoid antipsychotics if able given increasing cholesterol levels, blood glucose, and current obese state. Concern about metabolic syndrome with longterm antipsychotic exposure - though if there develops a psychosis would need to treat accordingly     PLAN:   Psychiatric:  -Fluoxetine 40mg  daily  -increase buspirone 15mg  TID to 20mg  TID  -prazosin 1mg  nightly  -melatonin 3mg  nightly  -PRN trazodone 50mg  nightly, with 1 additional available (per medical record review, no history of hives with trazodone or indication that pt is allergic)  -discontinue PRN quetiapine  Metabolic monitoring for patients on antipsychotics: lipid panel 04/10/22 cholesterol 263, triglycerides 301, HDL 40, LDL 192. HbA1C (07/15/22) 6.5    Seizure d/o   -keppra 750mg  BID   -oxcarbazepine 150mg  BID    Substance use d/o   -suboxone 8mg  TID    Neuropathic pain    -gabapentin 600mg  TID    Cognitive:  No current concerns      Medical:  Cardiovascular: atorvastatin 80mg  daily, spironolactone 100mg  daily, torsemide 100mg  daily   COPD: budesonide-formoterol 2 puffs BID, fluticasone 2 spray BID, albuterol inhaler PRN, tiotropium 2 puffs daily    Nicotine replacement: nicotine gum PRN, nicotine patch daily   Back spasm: baclofen 10mg  BID, lidocaine patch, voltaren gel, methocarbamol 500mg  TID  GERD: pantoprazole 40mg  daily  Supplement: thiamine 100mg  daily       Bowel regimen ppx: magnesium hydroxide PRN,  aluminium-magnesium hydroxide-simethicone PRN, miralax daily, senna nightly   Pain ppx: PRN tylenol, voltaren gel  DVT ppx: Patient is ambulatory  Diet: consistent card, dysphagia/restricted     Legal/safety:  CV  HCP - needs assessment  Code status Full code   Checks Interval: 5 min  Medical bed    Dispo: pending clinical stability, likely back to [name]  Living Situation  Living Setting: Skilled nursing facility      Review with patient: Treatment plan reviewed with the patient.    I have spent 35 minutes face-to-face with patient and/or floor/unit time of which over 50% of time was spent with counseling, education and coordination of care.

## 2022-07-18 NOTE — Group Note (Signed)
OT Psych Group Documentation    Topics: Feel Good      Occupations Addressed: Activities of daily living (functional mobility), Instrumental activities of daily living (health management & maintenance), Leisure, Social participation      Skills addressed: Fine/gross motor skills, social interaction skills, process skills, emotional regulation, mental functions                 Date: 07/18/2022  Start Time: 1015  End Time: 1100    Attendance: No response to invite with nsg      Kateri Plummer, OT  lic# [id number]

## 2022-07-18 NOTE — Group Note (Signed)
OT Psych Group Documentation  Topics: Breakfast Bites      Occupations Addressed: Activities of Daily living, Social participation, Instrumental activities of daily living (health management & maintenance)     Skills addressed: Fine/gross motor skills, process skills, social interaction skills, behavioral control, mental functions (cognitive, perceptual, affective)                 Date: 07/18/2022  Start Time: 0745  End Time: 0845    Attendance: Declined      Romy Ipock C Makira Holleman, OT  lic# [id number]

## 2022-07-18 NOTE — Plan of Care (Signed)
Problem: Mood, Altered  Description: 51 y/o with DX: Bipolar D/O admitted for agitation and episode of cutting self superficially on both arms.  Feels dead son has inhabited her body and she cut herself to keep from hurting others.  Goal: Demonstrates/reports improvement in mood stability by discharge  Description: Pt to be free of feeling of self harm prior to discharge.  Pt to attend 2 groups per day by 11/1. Pt to be free of delusions prior to discharge. Pt to have 75% improvement is mood symptoms prior to discharge.  Outcome: Progressing    Pt alert, and oriented, visible at intervals, came to the milieu to make her needs known, has multiple requests, assisted with bathing, declined groups, used tablet in room. Pt anxious and irritable at times, no disruptive behavior, compliant with meds, requested, and medicated with PRN APAP, and Nicorette gum x 3 for pain, and nicotine craving with good effect, denies SI/HI/AH/VH. Pt refused bkfst, ate 100% lunch, reported last BM today. Continues on oxygen at 3 L, sat 93%, denies resp issues.  No COVID sx noted or reported. Safety checks, and rounding protocol maintained.  BP 120/84   Pulse 77   Temp 97.5 F (36.4 C) (Temporal)   Resp 18   Ht 5\' 9"  (1.753 m)   Wt (!) 155.5 kg (342 lb 12.8 oz)   SpO2 93%   BMI 50.62 kg/m

## 2022-07-18 NOTE — Group Note (Signed)
OT Psych Group Documentation    Topics: Project      Occupations Addressed: Leisure, Social participation     Skills addressed: Fine/gross motor skills, process skills, social interaction skills, behavioral control, mental functions                 Date: 07/18/2022  Start Time: 1100  End Time: 1145    Attendance: Declined  Ashna Dorough Jean-Gilles, OTA  lic# [id number]

## 2022-07-18 NOTE — Care Coordination (Signed)
51 yo female with Hx of schizoaffective DO bipolar type, PTSD, chronic CHF, morbid obesity, depression, and falls, admitted to [facility] on 07-15-2022.  She resides at [facility] and staff there called 911 due to her agitation and psychosis.  She believed her dead son was in her body, and he wanted to get out and hurt people.  He was also telling her to hurt people and she doesn't want to.  She arrived to the ER crying and incoherent.  CV signed, adjusting to the unit.  Insurance is [employer].  UR nurse does not do the reviews.

## 2022-07-18 NOTE — Initial Assessments (Signed)
Occupational Therapy: Initial Assessment  S: "They don't have any activities over there. The only thing I have to look forward to are smoke breaks"  O: Cheryl Zimmerman is a 51 y/o female who was BIBA from [facility] (where she resides) with acute agitation with CAH, and SIB via razor to forearms. Although Cheryl Zimmerman cannot recall her state of mind during the admission process, chart review from [name] eval on 10/27 states that Cheryl Zimmerman was screaming and yelling, believing that her deceased son had entered her body and was now trying to get out. He was also commanding her to hurt other people. These symptoms have resolved, and with writer she denies SI/HI. PMH: Schizoaffective d/o (depressive type), Borderline personality d/o, AUD, OUD, TUD, SI, SIB, PTSD, Hep C, Seizure d/o, chronic pain d/o, spinal stenosis, morbid obesity, gait instability, CHF. PLOF: Cheryl Zimmerman lives in a LTC facility where she has been needing more assistance with self-care, she is also w/c level and is only performing transfers. She reports that she has met with a surgeon who is recommending surgery to fix spinal stenosis and to improve her quality of life and mobility.      07/18/22 1400   Language Information   Language of Care English   Interpreter No   Rehab Discipline   Rehab Discipline Psych OT   Services prior to admission?   Type of Home Care Services Other (Comment)  (LTC resident)   Premorbid Mobility   Transfers Independent   Walking Unable   Walking assistive devices used None   Wheelchair mobility Independent;Household distances only   Type of wheelchair used Manual   Stair negotiation Unable   ADL / IADL Baseline Status   ADL/IADL Baseline Status Yes   ADL's/IADL's   Dressing Required assistance   Bathing Required assistance   Toileting Independent   Bladder Continent   Bowel Continent   Household Chores Required assistance   Premorbid Building control surveyor with lenses   Premorbid Cognition   Cognition  Intact   Living Situation   Living Setting Skilled nursing facility   Lives With Alone   Perception   Inattention/Neglect Appears intact   Initiation Appears intact   Motor Planning Appears intact   Perseveration Not present   IADL / ADL   IADL/ADL Yes   UE Bathing   UE Bathing Level of Assistance Supervision;Setup   UE Bathing Where Assessed Wheelchair   LE Bathing   LE Bathing Level of Assistance Maximum assistance 1  (fopr rear peri)   LE Bathing Where Assessed Wheelchair   LE Bathing Comments able to clean lower front body with significant leaning back as she cannot tolerate standing long enough   UE Dressing   UE Dressing Level of Assistance Supervision   UE Dressing Where Assessed Wheelchair   LE Dressing   LE Shoe Level of Assistance Supervision   LE Adult Brief Level of Assistance Supervision;Maximum assistance 1  (regular underwear. Supervision to thread, Max A to pull up)   LE Dressing Where Assessed Wheelchair   Daily Functioning   Coping Skills Being alone;Creative arts;Identifies/engages in unhealthy coping skills;Going to a quiet place;Journaling;Listening to music;Medication;Reading;Sleeping;Watching TV/movies       ;Using the computer/playing video games   Interpersonal Approachable;Cooperative;Polite   Leisure/Interests Creative arts     ;Cooking;Lacks positive social supports/resources;Reading;Watching TV/movies;Writing    Responsibilities/Structures Has no current day structure   Strengths Accepts supports;Artistic/creative;Asks for help/support     ;Energetic;Goal directed;Honest;Resourceful  Sensory Preferences   Sensory Preferences Auditory;Olfactory;Tactile   Auditory listen to music, sound of rain, thunderstorm, Surveyor, minerals Sensitivities   Sensory Sensitivities Auditory   Auditory loud yelling or loud music   Cognition   Attention [facility]   Decision Making Impaired   Follows Commands WFL   Frustration Tolerance Impaired   Memory WFL   Problem  Solving X   Processing Skills Impaired   Judgement X   Insight Fair   Impulse Control Impaired   Orientation Level Oriented x4   Task Initiation WFL   Task Completion [facility]   Flexibility of Thought Impaired   Planning Skills Impaired   Organizational Skills Impaired   Processing Speed [facility]   Perseveration Not present   Working Memory [facility]   Emotional Regulation Impaired   Coping/Life Skills Impaired   Child/Adolescent Additional   Gross Motor Performance Observation Weak;Tremors   Behaviors Associated with Motor Problems Avoids certain motor activities   Fine Motor WFL   Risk Areas   Risk Areas Aggressive behavior;Assaultive behavior;Death;Losses ;History of hospitalization;Lack of support(s);Self-injurious behaviors;Substance abuse;Suicidality;Threatening behavior       ;Trauma history (physical/emotional/sexual)        A: Cheryl Zimmerman is rec'd in a wc, self-propelling toward her room, she Engineer, building services with a loud and friendly "Hi Honey!" as Comptroller are familiar with each other from previous admissions. Cheryl Zimmerman reports that she is in a good mood, and that she does not remember her admission status. She does add that she misses her son, that she will always miss him. She also reports that she has no one to talk to about him, or anything else on her mind. And she goes on to add that there is nothing to do but sit around in a wc at [facility], she says that there are no activities, or rehab therapists that can help her with her declining mobility and function. She appropriately asks this writer to help her wash and dress, and so this is carried out concurrently (see flowsheet for levels of assist).   Overall, Cheryl Zimmerman presents at her best, friendly, appropriate, grateful, and understanding. She does appear very limited in mobility and cannot stand for long periods. This will be mentioned to her staff psychiatrist who may want to consider therapies available on this unit. Cheryl Zimmerman seems reasonable at this time, though  she is known for a quick temper, and she can feel personally slighted quite easily.   Overall, Cheryl Zimmerman presents with agitation and aggression in context of schizoaffective d/o, leading to unsafe behaviors in the community and at home. Pt has decreased occupational performance in: activities of daily living (bathing, toilet hygiene, dressing, personal hygiene, sexual activity), Instrumental activities of daily living (financial management, community mobility, home management, meal preparation), rest & sleep, work, leisure, and social participation as a result of mental health decompensation.    Pt will benefit from skilled Psych OT services to promote day structure and socialization in order to address deficits in coping skills and symptom mgt. Pt would also benefit from group and individual treatment with an emphasis on symptom management, sensory based treatment, medication education and management, improving self-care, improving safety, stress/anger management, communication skills/self-expression/self-advocacy, self-awareness/self-worth, functional goal setting, emotional regulation, identifying self-soothing/grounding/coping skills, exploration of warning signs/triggers that lead to hospitalization, and exploration of engagement in meaningful occupations in order to promote functional independence in daily activities/occupations in both home and community settings.  P: Engage patient in OT plan of care to progress towards functional OT goals.    OT Plan of Care:  Pt should be encouraged to participate in all groups and milieu activities. OT to evaluate need for alternative treatment if appropriate.  Pt to receive orientation, psycho-education, and practice with sensory tools and items available on the unit to enhance opportunities to develop self-regulation strategies.   OT to continue to assess pt's functional, sensory, self-regulation, social, and cognitive skills, and to determine strengths and limitations  in relation to pt's participation in valued occupations and in maintaining safety.             OT GOALS    STG: Pt will demonstrate increased engagement in meaningful activities by attending one OT group per 3 days by 08/01/22    LTG: Pt will select and engage in 2 appropriate sensory and/or leisure activities in order to promote self-regulation, limit adverse behavioral responses, and encourage daily structure by 08/15/22            Kateri Plummer, [place], [place] # [id number]

## 2022-07-18 NOTE — Initial Assessments (Signed)
Initial Social Work Geriatric Note      Name: Cheryl Zimmerman     Sources of information: EMR, patient, collateral     Per London Pepper, LICSW, [name] eval note  Identifying Data:  [name] residing in [facility], hx pSUD schizoaffective disorder bipolar type, PTSD,  PMHx significant for CHF, seizure disorder, hypothyroidism, ?COPD/OSA, GERD, chronic pain, spinal stenosis w/ sciatica, presenting for agitation, SIB cutting self with a razor.      Source of Information: Patient EMR     Chief Complaint: " My son is insight me"      Source of referral: [facility]     History of Present Illness (include acute symptoms, precipitants, changes in  functional status, and level of distress):   Chart review:   - Admitted medically for presumed HFpEF exacerbation in May 2023  - Admitted to [facility] for worsening SI from 12/18/21-12/22/21  - Admitted to [facility] for SI/HI  from 04/09/22- 04/12/22   ED RN TRIAGE NOTE  Patient arrives in acute psychiatric distress via EMS. She lives at [facility], and staff called due to agitation. Patient lost her son, and believes he is inhabiting her body, and trying to get out and hurt people. He is also telling her to hurt people, and she doesn't want to. She has made superficial cuts to her arms and legs in an effort to die, so she doesn't hurt others. Arrives inconsolably crying, incoherent.      ED Medicine Attending Note  Cheryl Zimmerman is a 51 year old female patient who has a past medical history of Bipolar affective ([place]) (schizo), Chronic systolic CHF (congestive heart failure) ([place]), Depression, Fall in home (12/23/2021), Gait instability (12/23/2021), Requires continuous at home supplemental oxygen (12/23/2021), Schizo affective schizophrenia ([place]) (depress), Spinal stenosis of lumbar 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 claudication (12/23/2021), and Substance abuse ([place]).      Patient presents by EMS after staff noted that she  was agitated and started to cut herself she believes that her child that died is it happening her body and plans to come out to kill other people.  Patient wanted to stop the child from coming out so it does not hurt anyone else.  History is limited because of the patient persistently crying and agitated.  Family history and social history noncontributory to encounter.  No known allergies.           On Interview:  Patient was seen in her room with MD Elba Barman and TW. She was pleasant upon approach. Patient was in a hospital gown. Patient stated, "I want to know when I can go back to rehab". She stated, "I am not crazy" and that she had an "episode". Patient stated that she could not walk when she got to [name]. She stated that she stayed in a hospital for 2 months and then went to [name] for short term for PT and OT. She stated that they want her to stay for LTC. She reported that she has not been receiving PT/OT services and she filed a Tourist information centre manager. She reported that her son was murdered in front of her and has been grieving from that. Has congestive heart failure. Patient is worried about her belongings at [facility] [employer]. Patient stated that she does not remember what led her to the hospital. MD Elba Barman described to patient what led to her admission. Patient has cuts on both of her forearms. She reported having "flashes" of wanting  to hurt a Georgina Peer (resident in SNF?) since she suspects that Georgina Peer has been taking her things. Patient denies SI/HI/AVH. Denies any legal issues. Denies access to weapons. She smokes 3 packs of cigarette a day. In her late 84s, he had 6 sessions of ECT. Denies taking TMS and ketamine assisted therapy. Is on SSI/SSA. Stated that her sleep is off and on and she feels tired throughout the day.     Cordelia Pen, [place]ts, [phone], Left VM      Key Collaterals:  PCP: Cora Collum, MD   Psychiatrist: none  Therapist: [address], [place], [place]ts, [phone]        Legal Status:   Conditional Voluntary     SW Plan:    -Meet with patient daily to assess mood, sleep and appetite and monitor progress in treatment  -Speak with collaterals, as need, to gain collateral information   -Evaluate living situation and support system  -Coordinate treatment and discharge with multidisciplinary team and collaterals   -Discharge from [facility]      Barriers to Discharge:  -SI/HI statements and SIB (cutting) which led to admission           Nehemiah Mcfarren-Swaby, LCSW

## 2022-07-18 NOTE — Plan of Care (Addendum)
Patient was received awake and alert, appropriately dressed, calmly watching movies on tablet.  Patient was visible in milieu just before dinner.  Patient is cooperative and calm on approach.  Patient consumed dinner with good appetite.  Patient is compliant with HS medications as ordered, but declined the inhalers and Nystatin Powder.  Patient is independent with ADL's and transfers to and from her wheelchair.  Patient denies SI/HI/AH/VH. Patient denies sore throat, change in taste or smell, cough, shortness of breath, headache, nasal congestion, runny nose, diarrhea, vomiting, or nausea. Staff will continue to monitor and maintain 15 minute safety checks.

## 2022-07-19 LAB — LEVETIRACETAM (KEPPRA): LEVETIRACETAM (KEPPRA): 21.1 ug/mL (ref 10.0–40.0)

## 2022-07-19 MED ORDER — BACITRACIN 500 UNIT/GM EX OINT
TOPICAL_OINTMENT | Freq: Three times a day (TID) | CUTANEOUS | Status: DC
Start: 2022-07-19 — End: 2022-07-20
  Filled 2022-07-19: qty 0.93

## 2022-07-19 NOTE — Group Note (Signed)
OT Psych Group Documentation    Topics: Project      Occupations Addressed: Leisure, Social participation     Skills addressed: Fine/gross motor skills, process skills, social interaction skills, behavioral control, mental functions               Date: 07/19/2022  Start Time: 1100  End Time: 1145    Attendance: Declined      Nyrie Sigal C Vasilios [place]taway, OT  lic# [id number]

## 2022-07-19 NOTE — Plan of Care (Signed)
Patient received in bed resting at change of shift in no acute distress, continues on oxygen therapy saturation 94% on 3 L of oxygen, irritable on approach, received schedule Synthroid. Patient stable in no acute distress, slept 6.3 hrs, LBM 07/18/22. Safety precaution maintained, patient continues on 5 minute safety checks a/o.
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
## 2022-07-19 NOTE — Page 1 Discharge - Orders (Incomplete)
PATIENT CARE REFERRAL FORM    Patient Demographics:           Name MRN Sex Birthdate Social Security   Cheryl Zimmerman [id number] Female  1971/06/08  (51 year old) [id number]   Address Home Phone Insurance ID Marital Status Religion   [address]  [facility] [employer] [place] [address] [id number] Payor: [facility] [place] /  /  /   [id number] Divorced None   PCP Admission Date Discharge Date Attending Provider Code Status   [name], MD 07/15/22      07/20/22 Dannette Barbara, MD Full Code     Primary Contacts:            Extended Emergency Contact Information  Primary Emergency Contact: Kitchens,Pauline  Mobile Phone: [phone]  Relation: Family Other  Secondary Emergency Contact: Evaul,Kathleen  Mobile Phone: [phone]  Relation: Friend    Healthcare Proxy: Data Unavailable  Phone Number: Data Unavailable    Discharge:             Discharge to: Return to SNF/LTC  Address: [address] [place], [place] [address]  Has discharge transportation arranged?    Transportation at discharge:      Contact number after discharge: [phone]  Best time of day to contact: Anytime  Nurse/staff has permission to speak with: SNF staff    Interpreter requested: No  Family notified of disposition: No (comment)       Diagnosis:             Isolation:  none  Infections:  none    Patient Active Problem List:     Dyspnea on minimal exertion     Suicidal behavior with attempted self-injury ([name])     Tobacco use disorder     Systolic congestive heart failure ([name])     Seizure disorder ([name])     Schizoaffective disorder, depressive type ([name])     Suicidal ideation     Afib ([name])     Alcohol use disorder     Borderline personality disorder ([name])     Chronic pain disorder     Morbid obesity ([name])     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)     PTSD (post-traumatic stress disorder)     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     (HFpEF) heart failure with preserved ejection fraction ([name])     MDD (major depressive disorder),  recurrent, severe, with psychosis ([name])     Requires continuous at home supplemental oxygen     Spinal stenosis of lumbar 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 claudication     Fall in home     Gait instability     Hypokalemia     Lung nodule     Stable angina     Chest pain, atypical     Obesity hypoventilation syndrome ([name])      Review of Patient's Allergies indicates:   Bee venom               Anaphylaxis   Metolazone              Other (See Comments)    Comment:Hypokalemia   Methylprednisolone      Dizziness, Drowsiness    Comment:Also believes syncope. Seems to tolerate             inhalational and epidural steroid.   Nutritional supplem*       Hydroxyzine             Nausea Only  Hospital Administered Immunizations Administered for This Admission  Never Reviewed      Name Date    Tdap  07/15/2022          Physician Order's: (Include specific orders for Diet, Labs, or Tests)       -Please provide psychotherapy weekly   -Please continue treatment with psychiatric nurse practitioner   -Patient prefers single room         Insulin Instructions  No orders of the defined types were placed in this encounter.        Current Discharge Medication List    START taking these medications    mirtazapine (REMERON) 7.5 MG tablet  Take 1 tablet by mouth nightly  Qty: 30 tablet Refills: 0      CONTINUE these medications which have CHANGED    busPIRone (BUSPAR) 10 MG tablet  Take 2 tablets by mouth in the morning and 2 tablets at noon and 2 tablets before bedtime.  Qty: 180 tablet Refills: 0    prazosin (MINIPRESS) 1 MG capsule  Take 1 capsule by mouth nightly  Qty: 30 capsule Refills: 0      CONTINUE these medications which have NOT CHANGED    baclofen (LIORESAL) 10 MG tablet  Take 10 mg by mouth in the morning and 10 mg before bedtime.    Budeson-Glycopyrrol-Formoterol 160-9-4.8 MCG/ACT AERO  Inhale 2 puffs into the lungs in the morning and 2 puffs before bedtime.    bumetanide (BUMEX) 1 MG tablet  Take 3 mg by mouth in the morning and 3  mg before bedtime.    buprenorphine-naloxone (SUBOXONE) 8-2 MG sublingual tablet  Place 1 tablet under the tongue in the morning and 1 tablet at noon and 1 tablet before bedtime.    fluticasone (FLONASE) 50 MCG/ACT nasal spray  2 sprays by Each Nostril route in the morning and 2 sprays before bedtime.    FLUoxetine (PROZAC) 40 MG capsule  Take 40 mg by mouth in the morning.    hydrOXYzine (ATARAX) 25 MG tablet  Take 25 mg by mouth every 6 (six) hours as needed for Anxiety    Melatonin 5 MG TABS Tablet  Take 5 mg by mouth nightly    methocarbamol (ROBAXIN) 500 MG tablet  Take 500 mg by mouth in the morning and 500 mg at noon and 500 mg before bedtime.    Multiple Vitamin (MULTIVITAMIN) TABS  Take 1 tablet by mouth in the morning.    pantoprazole (PROTONIX) 40 MG tablet  Take 40 mg by mouth in the morning.    polyethylene glycol (GLYCOLAX/MIRALAX) 17 g packet  Take 17 g by mouth in the morning.    Potassium Chloride ER 20 MEQ TBCR  Take 20 mEq by mouth in the morning.    sennosides (SENOKOT) 8.6 MG tablet  Take 17.2 mg by mouth at bedtime    thiamine (VITAMIN B-1) 100 mg tablet  Take 100 mg by mouth in the morning.    tiotropium (SPIRIVA) 18 MCG inhalation capsule  Inhale 18 mcg into the lungs in the morning.    OTHER MEDICATION  1 each by Other route in the morning. Wheelchair: Bariatric motorized wheel chare (height 5'9", weight 328 lb, bmi 48.4)  For use as directed.    Please fax to [facility] at [phone]  DX: Spinal stenosis of lumbar 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 claudication, Gait instability, Morbid obesity ([name])  ICD10:M48.061, R26.81, E66.01.  Qty: 1 each Refills: 0  Associated Diagnoses:Spinal stenosis of  lumbar 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 claudication; Gait instability; Morbid obesity ([name])    atorvastatin (LIPITOR) 80 MG tablet  Take 1 tablet by mouth in the morning.  Qty: 30 tablet Refills: 0    gabapentin (NEURONTIN) 300 MG capsule  Take 2 capsules by mouth in the morning and 2 capsules at noon and 2 capsules  before bedtime.  Qty: 180 capsule Refills: 0    levETIRAcetam (KEPPRA) 750 MG tablet  Take 1 tablet by mouth in the morning and 1 tablet before bedtime.  Qty: 60 tablet Refills: 0    levothyroxine (SYNTHROID) 25 MCG tablet  Take 1 tablet by mouth every morning before breakfast  Qty: 30 tablet Refills: 0    OXcarbazepine (TRILEPTAL) 150 MG tablet  Take 1 tablet by mouth in the morning and 1 tablet before bedtime.  Qty: 60 tablet Refills: 0    spironolactone (ALDACTONE) 100 MG tablet  Take 1 tablet by mouth in the morning.  Qty: 30 tablet Refills: 0    tiotropium (SPIRIVA RESPIMAT) 2.5 MCG/ACT aerosol solution  Inhale 2 puffs into the lungs in the morning.  Qty: 1 each Refills: 0    torsemide (DEMADEX) 100 MG tablet  Take 1 tablet by mouth in the morning.  Qty: 30 tablet Refills: 0    traZODone (DESYREL) 50 MG tablet  Take 1 tablet by mouth nightly as needed for Sleep  Qty: 30 tablet Refills: 0    albuterol HFA 108 (90 Base) MCG/ACT inhaler  Inhale 2 puffs into the lungs every 6 (six) hours as needed for Wheezing  Qty: 1 each Refills: 0  Comments: OK to switch to covered brand alternative      STOP taking these medications    QUEtiapine (SEROQUEL) 100 MG tablet  Comments:  Reason for Stopping:          Palliative Care Referral  No orders of the defined types were placed in this encounter.    Hospice Referral  No orders of the defined types were placed in this encounter.    Discharge Supplies:      Patient declined referral to tobacco cessation counseling at discharge     Order already placed  or patient DECLINED  nicotine replacement med     *Reason you came:     Order Specific Question Answer Comments   Symptom(s) that brought the patient to the hospital episode of altered mentation, hallucinations      Important results from the hospital stay     Order Specific Question Answer Comments   Important results from your hospital stay improvement in mental status, adjustment of medications            Follow-up Information        Follow up With Specialties Details Why Contact Info    [name] - [facility]  Go to You will be discharged back to [name] for long term care. [address]  [name] [address]  [phone]          Current and Future Appointments at [facility] [phone] Days) 07/20/2022 - 10/18/2022      None            Physical Therapy:              Not formally assessed this admission. Patient using wheelchair     [place]:      CERTIFICATION (When Applicable):  I certify that the above named patient is: Requires skilled nursing care on a continuing basis for any of the conditions for which he/she received care during this hospitalization.    Who will follow this patient after discharge: [name], MD or [name] psychiatric NP and physician      Electronically signed by:  Dannette Barbara, MD

## 2022-07-19 NOTE — Group Note (Signed)
OT Psych Group Documentation    Topics: Feel Good      Occupations Addressed: Activities of daily living (functional mobility), Instrumental activities of daily living (health management & maintenance), Leisure, Social participation      Skills addressed: Fine/gross motor skills, social interaction skills, process skills, emotional regulation, mental functions               Date: 07/19/2022  Start Time: 1015  End Time: 1100    Attendance: Declined a direct invite    Astrid Vides C Minervia Osso, OT  lic# [id number]

## 2022-07-19 NOTE — Plan of Care (Addendum)
Problem: Mood, Altered  Description: 51 y/o with DX: Bipolar D/O admitted for agitation and episode of cutting self superficially on both arms.  Feels dead son has inhabited her body and she cut herself to keep from hurting others.  Goal: Demonstrates/reports improvement in mood stability by discharge  Outcome: Progressing   Patient mostly in her room, declined group activities, used I pad to watch movies in her room . Anxious and irritable at times, requested PRN Zyprexa  with effect. Pt ate 100% of supper, VSS, + for HS med, with exception of Minipress due to BP 91/60.  Pt denied SI or urge to hurt herself. Multiple superficial self scratches on bilateral arms are drying, treatment applied.  No sign of infection.  Cont on 15" safety checks.

## 2022-07-19 NOTE — Discharge Summary (Incomplete)
GERIATRIC PSYCHIATRY DISCHARGE SUMMARY     Date of Admission: 07/15/22    Date of Discharge: 07/20/22    "Hand-Off" Transition Issues:   -monitor medication changes  -engage in psychotherapy weekly to explore trauma   -f/u with outpatient medical providers regarding longer standing medical issues  -encourage decrease of tobacco use     Summary of Reason for Admission:   51yo woman with PTSD, schizoaffective d/o bipolar type, past SUD, medical hx of CHF, seizure d/o, hypothyroidism, COPD on home oxygen, GERD, chronic pain, spinal stenosis, who presents to the hospital from [name] for acute distress and SIB (cutting self with razor). Patient reported belief that her deceased son was inhabiting her body and trying to get her to hurt others. She made superficial cuts to her arms and legs, and also reported HI toward Tresa EndoKelly (peer [facility])    Hospital Course: Please see previous completed admission note for background history and data for this patient admitted to the [facility] on 07/16/22 on CV status which she remained on throughout the hospitalization.       On the psychiatric unit, patient was demanding with needs and pushed boundaries (asked for razor to shave repeatedly despite setting limit she would not be provided one here, so shortly after her SIB resulting in numerous superficial scabbing to arms and legs). Otherwise she remained behaviorally appropriate and did not have any assaultive or aggressive behaviors.  She mostly appeared to remain in her room and use the tablet watching shows. There were no physical or chemical restraints.      Psychiatrically, patient presenting concerns rapidly resolved within a day of hospitalization. She reports no recollection of the events that brought her into the hospital. It is certainly possible given the high emotionality related to her deceased son, the affective lability/volatility - patient experienced a traumatic/dissociative episode. She adamantly denied any  SI/HI/AVH throughout hospitalization, and appeared appropriate (not internally preoccupied or odd or psychotic). Minor adjustments  to medications were made, including discontinuing quetiapine (which the psychiatric NP at her facility agreed with), increasing buspirone from 15mg  to 20mg  TID for anxiety, and initiating mirtazapine 7.5mg  nightly to help with sleep. Patient reported no issues with these medication changes.      Cognitively, no concerns during this hospitalization     Medically, patient remained on 2-3L O2 throughout hospitalization. She used oxygen intermittently while awake and asleep. There were no medical incidents requiring immediate attention.      On day of discharge, ***    Consults:   Medical H&P by Dr. Janee MornHan on 10/28    Labs:  CBC:   Lab Results   Component Value Date    WBC 8.2 07/15/2022    HGB 14.0 07/15/2022    HCT 44.1 07/15/2022    PLTA 280 07/15/2022    RBC 4.70 07/15/2022     BMP:   Lab Results   Component Value Date    NA 141 07/15/2022    K 3.8 07/15/2022    CL 99 07/15/2022    CO2 30 07/15/2022    BUN 10 07/15/2022    CREAT 0.8 07/15/2022    GLUCOSER 100 07/15/2022    CA 9.2 07/15/2022     LFTs:   Lab Results   Component Value Date    AST 22 04/07/2022    ALT 27 04/07/2022    TBILI 0.4 04/07/2022    ALKPHOS 212 (H) 04/07/2022     COAGs:   Lab Results   Component Value  Date    PT 12.6 10/16/2021    PT 10.4 06/02/2011    INR 1.0 10/16/2021    INR 1.0 (L) 06/02/2011    APTT 30 10/16/2021    APTT 25.4 06/02/2011       STox:   Lab Results   Component Value Date    ETOH < 10 07/15/2022    ACETA < 5 07/15/2022    SAL < 0.5 07/15/2022    BARB NEGATIVE 05/31/2011    BENZO NEGATIVE 29/92/4268    TRICYCLIC NEGATIVE 34/19/6222     UTox:   Lab Results   Component Value Date    COCAINE NEGATIVE 07/15/2022    AMPHET POSITIVE (A) 07/15/2022    OPIATES NEGATIVE 07/15/2022    BARBU NEGATIVE 06/11/2009    BENZOU POSITIVE (A) 07/15/2022    ETOHU NEGATIVE 07/15/2022     CANNABINOIDS  X/XX/XXXX       Lab  Results   Component Value Date    TSH 0.636 03/23/2022    B12 287 12/22/2021     Urine Analysis:  Lab Results   Component Value Date    UACOL YELLOW 01/18/2022    UACLA SL CLOUDY (A) 01/18/2022    UAGLU NEGATIVE 01/18/2022    UABIL NEGATIVE 01/18/2022    UAKET NEGATIVE 01/18/2022    SPEGRAVURINE 1.010 01/18/2022    UAOCC NEGATIVE 01/18/2022    UAPH 7.0 01/18/2022    UAPRO NEGATIVE 01/18/2022    UANIT NEGATIVE 01/18/2022    LEUKOCYTES NEGATIVE 01/18/2022       Significant Diagnostic Studies:  Imaging:  3/19 CT head w/o contrast   Brain: Gray-white differentiation is maintained. There is no hemorrhage or abnormal mass effect.     Ventricles and CSF spaces: Normal in size and morphology.     Sinuses: There is mild mucoperiosteal thickening of the left frontal sinus.     Mastoid air cells: Clear.     Orbits: Unremarkable.     Bones: The calvarial vault and skull base are intact.     Extracranial: Unremarkable     IMPRESSION:      No CT evidence of acute intracranial abnormality.     EKG:  Refused EKG so far this admission     CSSRS:      Discharge Mental Status:   Appearance: Age, older/younger than stated age,  dress, grooming, hygiene  Behavior: Agitated, hypo/hyperactive, tics, pacing  Mannerisms, gestures, psychomotor activity, eye contact, ability to follow commands/requests, compulsions  Level of Consciousness: Alert, vigilant, drowsy, lethargic, stuporous, asleep, comatose, confused, fluctuating  Orientation: self, place, date, situation  Attention: Distracted, engaged etc  Speech: Rate (pressured, fast, slowed, regular), Quantity (talkative, spontaneous, expansive, paucity, poverty), rhythm/fluency (slurred, clear, hesitant, with good articulation, stuttering) Volume (loud, soft, monotone), accent/dialect, long or short latency of speech  Mood: " "   Affect:  flat, blunted, constricted, full,  intense  slugging,  supple, labile  appropriate,  inappropriate / congruent  quality - sad, angry, hostile, indifferent,  euthymic, dysphoric, detached, elated, euphoric, anxious, animated, irritable   Thought Process: Loosening of associations, flight of ideas, neologisms (made up words), word salad, clang associations, thought blocking, tangentiality, circumstantiality, perseveration, incoherent, racing, evasive  Normal: linear, logical, goal directed   Thought content: poverty of thought or overabundance, delusions (grandeur, paranoid, reference, thought broadcasting, religious, somatic), suicidal / homicidal thoughts, phobias, obsessions, compulsions  Perception: hallucinations - auditory, visual, taste, olfactory, tactile. Illusions.   Judgment:  Insight:     DSM5 DIAGNOSIS  DSM-V    Primary Diagnosis: Schizoaffective disorder, bipolar type Cheryl Zimmerman)        Discharge Case Formulation:    Cheryl Zimmerman is a 51yo woman with schizoaffective d/o bipolar type, PTSD, h/ past SUD (etoh, cocaine), medical history of CHF, seizure d/o, hypothyroidism, COPD, GERD, chronic pain, spinal stenosis, who presents with an episode of distress and SIB cutting with razor. Given the high emotionality and distress of what brought her into the hospital (believing her son was in her body telling her to harm others, CAH) and her current inability to remember the episode and appropriate presentation - do believe that this could represent a dissociative episode consisting of emotional lability, in reaction to historical trauma and poor coping skills. This affective lability and clinical history/poor psychotropic treatment response is suggestive of borderline personality disorder, for which she has a historical diagnosis. There does not seem to be an active psychosis or decompensated mood episode currently. Suspect that psychiatric medications will not make a meaningful improvement to her current presentation and she will need to engage in long-term supportive housing, therapy, and psychosocial supports to stay stable.      Discussed that there are limitations  to medications to her needs, including for sleep and current distress - but will make some reasonable and safe adjustments to see if we can obtain benefit. Would avoid antipsychotics if able given increasing cholesterol levels, blood glucose, and current obese state. Concern about metabolic syndrome with longterm antipsychotic exposure - though if there develops a psychosis would need to treat accordingly     Discharge Risk Assessment: In terms of risk, patient remains at low to moderate risk of suicide given her borderline personality disorder, emotional lability and previous history of self-harm/suicide. Protecting her is her strong will to live, help-seeking nature, denying having SI or thoughts of death right now. This is further mitigated by discharge to a safe supportive environment at [facility], with connection to psychiatric treatment and therapy weekly as described by psychiatric NP there. She has remained abstinent from cocaine and alcohol for several months now that she is housed and receiving medical treatment, which we continue to encourage her to do. She is at low risk for violence and has no history of violent action. She has adamantly denied any AVH or HI during hospitalization and states that if in conflict with Tresa Endo would choose non-violent means of responding (eg. calling police).     Based on available information and with reasonable medical certainty, there was no indication that at the time of discharge the patient presented, due to mental illness, an imminent and substantial risk of harm to themselves or others. The patient denied plan or intent to harm others upon discharge. Modifiable risk factors have been addressed, appropriate follow-up is arranged, and she has been given contact information for crisis and contingency management in the community.     Discharge Medications:      Medication List        ASK your doctor about these medications      albuterol HFA 108 (90 Base) MCG/ACT  inhaler  Inhale 2 puffs into the lungs every 6 (six) hours as needed for Wheezing     atorvastatin 80 MG tablet  Commonly known as: LIPITOR  Take 1 tablet by mouth in the morning.     baclofen 10 MG tablet  Commonly known as: LIORESAL     Budeson-Glycopyrrol-Formoterol 160-9-4.8 MCG/ACT Aero     bumetanide 1 MG tablet  Commonly known as: BUMEX  buprenorphine-naloxone 8-2 MG sublingual tablet  Commonly known as: SUBOXONE     busPIRone 15 MG tablet  Commonly known as: BUSPAR     FLUoxetine 40 MG capsule  Commonly known as: PROzac     fluticasone 50 MCG/ACT nasal spray  Commonly known as: FLONASE     gabapentin 300 MG capsule  Commonly known as: NEURONTIN  Take 2 capsules by mouth in the morning and 2 capsules at noon and 2 capsules before bedtime.     hydrOXYzine 25 MG tablet  Commonly known as: ATARAX     levETIRAcetam 750 MG tablet  Commonly known as: KEPPRA  Take 1 tablet by mouth in the morning and 1 tablet before bedtime.     levothyroxine 25 MCG tablet  Commonly known as: SYNTHROID  Take 1 tablet by mouth every morning before breakfast     Melatonin 5 MG Tabs Tablet     methocarbamol 500 MG tablet  Commonly known as: ROBAXIN     multivitamin Tabs     OTHER MEDICATION  1 each by Other route in the morning. Wheelchair: Bariatric motorized wheel chare (height 5'9", weight 328 lb, bmi 48.4)  For use as directed.    Please fax to [facility] at [phone]  DX: Spinal stenosis of lumbar 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 claudication, Gait instability, Morbid obesity ([name])  ICD10:M48.061, R26.81, E66.01.     OXcarbazepine 150 MG tablet  Commonly known as: TRILEPTAL  Take 1 tablet by mouth in the morning and 1 tablet before bedtime.     pantoprazole 40 MG tablet  Commonly known as: PROTONIX     polyethylene glycol 17 g packet  Commonly known as: GLYCOLAX/MIRALAX     Potassium Chloride ER 20 MEQ Tbcr     * prazosin 2 MG capsule  Commonly known as: MINIPRESS     * prazosin 1 MG capsule  Commonly known as: MINIPRESS  Take 1 capsule  by mouth nightly     QUEtiapine 100 MG tablet  Commonly known as: SEROquel  Take 1 tablet by mouth at bedtime     sennosides 8.6 MG tablet  Commonly known as: SENOKOT     spironolactone 100 MG tablet  Commonly known as: ALDACTONE  Take 1 tablet by mouth in the morning.     thiamine 100 mg tablet  Commonly known as: VITAMIN B-1     * tiotropium 18 MCG inhalation capsule  Commonly known as: SPIRIVA     * tiotropium 2.5 MCG/ACT aerosol solution  Commonly known as: SPIRIVA RESPIMAT  Inhale 2 puffs into the lungs in the morning.     torsemide 100 MG tablet  Commonly known as: DEMADEX  Take 1 tablet by mouth in the morning.     traZODone 50 MG tablet  Commonly known as: DESYREL  Take 1 tablet by mouth nightly as needed for Sleep           * This list has 4 medication(s) that are the same as other medications prescribed for you. Read the directions carefully, and ask your doctor or other care provider to review them with you.                  Current number of prescribed, standing, antipsychotic meds: 0     The following side effects, risks and precautions regarding antipsychotic medications were reviewed: Extrapyramidal symptoms (EPS), weight gain, amenorrhea, metabolic syndrome and tardive dyskinesia (TD). The nature of EPS symptoms (stiffness, tremor, restlessness) and strategies for managing  them, should they appear.The potential for these medications to cause orthostatic hypotension and reviewed orthostatic precautions.The need to regularly monitor for weight gain and signs of metabolic disruption (increased serum cholesterol, glucose, and/or triglycerides) and take steps to prevent them because these effects can lead to diabetes and its associated health risks. The need to regularly monitor for evidence of TD, and that these symptoms are in some cases irreversible. The potential impact of these medications on heart rhythm, which may require that we check ECG's.     For elderly patients with dementia: These drugs are  associated with higher rates of death. Patient/HCP understand black box warning of increased risk of 1.6 to 1.7 times that of placebo of increased risk of strokes, cardiovascular and/or cerebrovascular events, respiratory problems and even death rates from use of antipsychotic agents in the elderly with dementia but the benefits and risks were carefully reviewed and benefits outweigh the risks and patient's HCP gave informed consent for continued antipsychotic treatment.    Risks/benefits/alternatives and side effects of rest of psychotropic medications were discussed with patient/HCP who gave informed consent for above treatment plan.     Discharge Activity: activity as tolerated    Discharge Diet: diabetic diet      Disposition/Living Situation:  Anticipated Discharge Plan  Lives With: Unable to assess  Type of Home Care Services: Other (Comment) (LTC resident)    Discharge Instructions:  Follow-up Information    None             I have spent > 35 minutes on the discharge planning and coordination for this patient.

## 2022-07-19 NOTE — Group Note (Signed)
OT Psych Group Documentation    Topics: Breakfast Bites      Occupations Addressed: Activities of Daily living, Social participation, Instrumental activities of daily living (health management & maintenance)     Skills addressed: Fine/gross motor skills, process skills, social interaction skills, behavioral control, mental functions (cognitive, perceptual, affective)                   Date: 07/19/2022  Start Time: 0745  End Time: 0845    Attendance: Declined      Cameo Schmiesing C Maurice Fotheringham, OT  lic# [id number]

## 2022-07-19 NOTE — Progress Notes (Addendum)
Patient remains in her room, calm and cooperative, less irritable, anxious in the morning and requested Zyprexa given with calming effect. In better mood in the afternoon. She took medications as prescribed including nicorette gums for craving and prn tylenol for back pain 6/10 with relief, she refused Miralax. She ate 100% of her meals, adequate po fluids intake. She Continues on oxygen therapy, O2 sat 93% on 3L, no SOB reported. Denies SI/HI/AH/VH. No safety concerns. Assisted with shaving. Scratches to bilateral arms and feet dry, healing well, bacitracin applied as ordered. She declined groups, watching movies using [place] tablet in room.  BP 106/60   Pulse 68   Temp 97.5 F (36.4 C)   Resp 18   Ht 5\' 9"  (1.753 m)   Wt (!) 155.5 kg (342 lb 12.8 oz)   SpO2 93%   BMI 50.62 kg/m

## 2022-07-20 MED ORDER — MIRTAZAPINE 7.5 MG PO TABS
7.5000 mg | ORAL_TABLET | Freq: Every evening | ORAL | 0 refills | Status: DC
Start: 2022-07-20 — End: 2022-10-28

## 2022-07-20 MED ORDER — BUSPIRONE HCL 10 MG PO TABS
20.00 mg | ORAL_TABLET | Freq: Three times a day (TID) | ORAL | 0 refills | Status: AC
Start: 2022-07-20 — End: 2022-08-19

## 2022-07-20 MED ORDER — PRAZOSIN HCL 1 MG PO CAPS
1.0000 mg | ORAL_CAPSULE | Freq: Every evening | ORAL | 0 refills | Status: DC
Start: 2022-07-20 — End: 2022-10-28

## 2022-07-20 NOTE — Progress Notes (Signed)
Discharge note Alert Ambulate at times self propelling in W/C Sitting in day room No delusions No hallucinations Denies pain Denies SI No SOB NO respiratory distress Ate 100% meals No self dialoguing LBM:07-20-2022 Cooperative and compliant with meds and care All meds reviewed Verbalizes understanding All paperwork will be given to ambulance attendants to hand over at the next facility All belongings returned to patient Report given to Hassan Rowan [phone]: 6 th at [facility] )

## 2022-07-20 NOTE — Group Note (Signed)
OT Psych Group Documentation    Topics: Feel Good      Occupations Addressed: Activities of daily living (functional mobility), Instrumental activities of daily living (health management & maintenance), Leisure, Social participation      Skills addressed: Fine/gross motor skills, social interaction skills, process skills, emotional regulation, mental functions          Date: 07/20/2022  Start Time: 1000  End Time: 1045    Attendance: Declined  Makaylee Spielberg Jean-Gilles, OTA  lic# [id number]

## 2022-07-20 NOTE — Group Note (Signed)
OT Psych Group Documentation    Topics: Project      Occupations Addressed: Leisure, Social participation     Skills addressed: Fine/gross motor skills, process skills, social interaction skills, behavioral control, mental functions                 Date: 07/20/2022  Start Time: 1100  End Time: 1145    Attendance: Declined      Daemyn Gariepy C Jerrett Baldinger, OT  lic# [id number]

## 2022-07-20 NOTE — Plan of Care (Signed)
Patient received in bed resting at change of shift in no acute distress, continues on oxygen therapy sat 94% on 3 L of oxygen, pleasant on approach, compliant with med, R/Received Nicorette gum at 0528. Patient denies any signs and symptoms of Covid ie SOB, Sore throat, Headache, N/V/D, runny nose, congestion, loss of smell or taste. Patient stable voiced no concerns, slept 4.5 hrs, LBM 07/19/22. Safety precaution maintained, she is scheduled to discharge today, time TBD, patient will continue on 5 minute safety checks a/o while on the unit.

## 2022-07-20 NOTE — Group Note (Signed)
OT Psych Group Documentation    Topics: Breakfast Bites      Occupations Addressed: Activities of Daily living, Social participation, Instrumental activities of daily living (health management & maintenance)     Skills addressed: Fine/gross motor skills, process skills, social interaction skills, behavioral control, mental functions (cognitive, perceptual, affective)          Date: 07/20/2022  Start Time: 0800  End Time: 0845    Attendance: Declined    Harshaan Whang Jean-Gilles, OTA  lic# [id number]

## 2022-07-20 NOTE — Progress Notes (Addendum)
SW Geriatric Discharge Note      Cheryl Zimmerman, is a 51 year old, female with PTSD, schizoaffective d/o bipolar type, past SUD, medical hx of CHF, seizure d/o, hypothyroidism, COPD on home oxygen, GERD, chronic pain, spinal stenosis, who presents to the hospital from [facility] for acute distress and SIB (cutting self with razor). [She was admitted to [facility] on 07/15/22]. Patient reported belief that her deceased son was inhabiting her body and trying to get her to hurt others. She made superficial cuts to her arms and legs, and also reported HI toward [employer] (peer at [facility] [employer]). Pt will be discharged today at 1 pm to [facility] ([address] [place], [place] [address]) via ambulance.       Patient denies SI/HI/AVH. She feels good about her discharge back to [name]. She is just worried about her belongings.         Follow up:  PCP appt: n/a; going to SNF    Psych appt: n/a; going to SNF    VNA: n/a; going to SNF    PACE: n/a; going to SNF        Key Collaterals:   PCP: Cora Collum, MD   Psychiatrist: none  Therapist: [address], [place], [phone]              Kennon Rounds, LCSW

## 2022-07-20 NOTE — RN Shift Note (Signed)
[name] was scheduled for discharge back to [facility] at 1:00pm today. Due to a backlog of ambulances [facility] did not arrive until 5:40pm. When they EMT's arrived they reported that the patient needed a bariatric stretcher due to her weight, height and width. They left and returned at 6:30pm. Cheryl Zimmerman was irritated but was able to maintain control. Her belongings and valuables were returned. [place] removed. Discharge instructions reviewed with EMS and report called to [name].

## 2022-07-20 NOTE — Progress Notes (Addendum)
Patient has been visible in the milieu, calm and cooperative, in good behavioral control. She took medications as prescribed including nicorette gums for craving and prn tylenol for back pain 6/10 with relief, she refused Miralax. She ate 100% of her meals, adequate po fluids intake. She Continues on oxygen therapy, O2 sat 93% on 3L, no SOB reported. Denies SI/HI/AH/VH. No safety concerns. Assisted with shower. Scratches to bilateral arms and feet scabs, healing well, no s/sx of infections, bacitracin applied as ordered. She will be discharged to her nursing facility this afternoon.  BP 109/67   Pulse 61   Temp 97.1 F (36.2 C)   Resp 18   Ht 5\' 9"  (1.753 m)   Wt (!) 155.5 kg (342 lb 12.8 oz)   SpO2 93%   BMI 50.62 kg/m

## 2022-07-21 NOTE — Care Coordination (Signed)
Patient was discharged back to [facility] on 07-20-2022.  Her case was reviewed in treatment team that morning.  Insurance remained [facility] throughout this hospital stay.  She did not want to appeal her discharge per Medicare protocol.  IM Medicare form was sent to [place] department and case was closed.

## 2022-08-02 ENCOUNTER — Telehealth (HOSPITAL_BASED_OUTPATIENT_CLINIC_OR_DEPARTMENT_OTHER): Payer: Self-pay

## 2022-08-02 NOTE — Telephone Encounter (Signed)
Cheryl Zimmerman from [facility] is calling for patient regarding a new prescription for a electric wheelchair - and letter of medical necessity of said wheelchair         [employer] - [phone]    Cheryl Zimmerman - [phone]

## 2022-08-09 ENCOUNTER — Ambulatory Visit: Payer: No Typology Code available for payment source | Admitting: Internal Medicine

## 2022-08-09 DIAGNOSIS — M5441 Lumbago with sciatica, right side: Secondary | ICD-10-CM | POA: Diagnosis not present

## 2022-08-09 DIAGNOSIS — M5442 Lumbago with sciatica, left side: Secondary | ICD-10-CM

## 2022-08-09 DIAGNOSIS — G8929 Other chronic pain: Secondary | ICD-10-CM | POA: Diagnosis not present

## 2022-08-09 DIAGNOSIS — M4726 Other spondylosis with radiculopathy, lumbar region: Secondary | ICD-10-CM | POA: Insufficient documentation

## 2022-08-09 NOTE — Telephone Encounter (Signed)
Called [facility] and spoke with Angelique Blonder, orders for wheelchair received and it is still in process

## 2022-08-09 NOTE — Progress Notes (Signed)
TELEMEDICINE VISIT  Subjective:   Cheryl Zimmerman is a 51 year old  female who is called to discuss back pain.  Bilateral lower back pain with sciatica, worsening.  MRI lumbar spine 10/16/21:   L1-L2:  There is disc desiccation but no significant disc bulge or   herniation.  There is severe arthropathy involving the right facet.   There is no significant central canal stenosis.  There is moderate   right-sided neural foraminal stenosis, secondary to facet   hypertrophy.  There is no significant left-sided neural foraminal   stenosis.         L2-L3: There is disc desiccation but no significant disc bulge or   herniation.  There is severe facet arthropathy with thickening of the   ligamentum flavum.  There is mild-to-moderate central canal stenosis.   There is severe right-sided neural foraminal stenosis, secondary to   facet hypertrophy.  There is mild left-sided neural foraminal   stenosis.       L3-L4: There is disc desiccation but no significant disc bulge or   herniation.  There is facet hypertrophy and hypertrophy of the   ligamentum flavum resulting in mild central canal stenosis.  There is   moderate bilateral neural foraminal stenosis, left greater than   right, secondary to facet disease.     L4-L5: There is disc desiccation but no significant disc bulge or   herniation.  There is severe facet hypertrophy bilaterally and severe   central canal stenosis secondary to thickening of the ligamentum   flavum.  There is moderate right-sided neural foraminal stenosis and   severe left-sided neural foraminal stenosis, secondary to facet   hypertrophy.     L5-S1: There is degenerative disc disease with a small broad-based   disc herniation lateralizing to the left.  There is severe facet   disease with mild central canal stenosis.  There is moderate   bilateral neural foraminal stenosis, secondary to facet hypertrophy.     The visualized paraspinal soft tissues appear normal.     IMPRESSION:  Degenerative changes with  central canal and neural   foraminal stenoses as described above.   ROS: negative except for above   Objective:   Exam - vitals deferred in setting of televisit  Speaking in complete sentences, breathing comfortably  Assessment/Plan:  1. Chronic bilateral low back pain with bilateral sciatica  2. Osteoarthritis of spine with radiculopathy, lumbar region  Poorly controlled pain  Consideration of ?surgery  Refer to spine center at [name]  - REFERRAL TO ORTHOPEDICS (EXT)          Sherrell Puller, MD, 08/09/2022   [phone]

## 2022-08-22 ENCOUNTER — Emergency Department
Admission: EM | Admit: 2022-08-22 | Discharge: 2022-08-22 | Disposition: A | Discharge status: Home / Self Care | Payer: No Typology Code available for payment source | Attending: Emergency Medicine | Admitting: Emergency Medicine

## 2022-08-22 ENCOUNTER — Other Ambulatory Visit: Payer: Self-pay

## 2022-08-22 ENCOUNTER — Emergency Department (HOSPITAL_BASED_OUTPATIENT_CLINIC_OR_DEPARTMENT_OTHER): Payer: No Typology Code available for payment source

## 2022-08-22 DIAGNOSIS — R600 Localized edema: Secondary | ICD-10-CM | POA: Insufficient documentation

## 2022-08-22 DIAGNOSIS — R0602 Shortness of breath: Secondary | ICD-10-CM | POA: Insufficient documentation

## 2022-08-22 LAB — CBC, PLATELET & DIFFERENTIAL
ABSOLUTE BASO COUNT: 0 10*3/uL (ref 0.0–0.1)
ABSOLUTE EOSINOPHIL COUNT: 0.1 10*3/uL (ref 0.0–0.8)
ABSOLUTE IMM GRAN COUNT: 0.04 10*3/uL (ref 0.00–0.10)
ABSOLUTE LYMPH COUNT: 1.1 10*3/uL (ref 0.6–5.9)
ABSOLUTE MONO COUNT: 0.7 10*3/uL (ref 0.2–1.4)
ABSOLUTE NEUTROPHIL COUNT: 8.4 10*3/uL — ABNORMAL HIGH (ref 1.6–8.3)
ABSOLUTE NRBC COUNT: 0 10*3/uL (ref 0.0–0.0)
BASOPHIL %: 0.2 % (ref 0.0–1.2)
EOSINOPHIL %: 0.6 % (ref 0.0–7.0)
HEMATOCRIT: 45.3 % — ABNORMAL HIGH (ref 34.1–44.9)
HEMOGLOBIN: 14.5 g/dL (ref 11.2–15.7)
IMMATURE GRANULOCYTE %: 0.4 % (ref 0.0–1.0)
LYMPHOCYTE %: 10.3 % — ABNORMAL LOW (ref 15.0–54.0)
MEAN CORP HGB CONC: 32 g/dL (ref 31.0–37.0)
MEAN CORPUSCULAR HGB: 30.1 pg (ref 26.0–34.0)
MEAN CORPUSCULAR VOL: 94.2 fl (ref 80.0–100.0)
MEAN PLATELET VOLUME: 10.8 fL (ref 8.7–12.5)
MONOCYTE %: 6.5 % (ref 4.0–13.0)
NEUTROPHIL %: 82 % — ABNORMAL HIGH (ref 40.0–75.0)
NRBC %: 0 % (ref 0.0–0.0)
PLATELET COUNT: 289 10*3/uL (ref 150–400)
RBC DISTRIBUTION WIDTH STD DEV: 47.8 fL — ABNORMAL HIGH (ref 35.1–46.3)
RED BLOOD CELL COUNT: 4.81 M/uL (ref 3.90–5.20)
WHITE BLOOD CELL COUNT: 10.2 10*3/uL (ref 4.0–11.0)

## 2022-08-22 LAB — BASIC METABOLIC PANEL
ANION GAP: 12 mmol/L (ref 10–22)
BUN (UREA NITROGEN): 11 mg/dL (ref 7–18)
CALCIUM: 9.4 mg/dL (ref 8.5–10.5)
CARBON DIOXIDE: 33 mmol/L — ABNORMAL HIGH (ref 21–32)
CHLORIDE: 99 mmol/L (ref 98–107)
CREATININE: 0.8 mg/dL (ref 0.4–1.2)
ESTIMATED GLOMERULAR FILT RATE: >60 mL/min (ref >60)
Glucose Random: 132 mg/dL (ref 74–160)
POTASSIUM: 3.7 mmol/L (ref 3.5–5.1)
SODIUM: 143 mmol/L (ref 136–145)

## 2022-08-22 LAB — TROPONIN T HS RANDOM: TROPONIN T HS RANDOM: 12 ng/L — ABNORMAL HIGH (ref 0–10)

## 2022-08-22 LAB — NT-PROBNP: NT-proBNP: <36 pg/mL (ref 0–125)

## 2022-08-22 MED ORDER — GABAPENTIN 300 MG PO CAPS
600.00 mg | ORAL_CAPSULE | Freq: Once | ORAL | Status: AC
Start: 2022-08-22 — End: 2022-08-22
  Administered 2022-08-22: 600 mg via ORAL
  Filled 2022-08-22: qty 2

## 2022-08-22 MED ORDER — BUMETANIDE 0.25 MG/ML IJ SOLN
1.0000 mg | Freq: Once | INTRAMUSCULAR | Status: AC
  Administered 2022-08-22: 1 mg via INTRAVENOUS
  Filled 2022-08-22: qty 4

## 2022-08-22 MED ORDER — BUPRENORPHINE HCL-NALOXONE HCL 8-2 MG SL SUBL
8.0000 mg | SUBLINGUAL_TABLET | Freq: Every day | SUBLINGUAL | Status: DC
Start: 2022-08-22 — End: 2022-08-22
  Administered 2022-08-22: 1 via SUBLINGUAL
  Filled 2022-08-22: qty 1

## 2022-08-22 MED ORDER — METHOCARBAMOL 500 MG PO TABS
500.00 mg | ORAL_TABLET | Freq: Once | ORAL | Status: AC
Start: 2022-08-22 — End: 2022-08-22
  Administered 2022-08-22: 500 mg via ORAL
  Filled 2022-08-22: qty 1

## 2022-08-22 NOTE — Narrator Note (Signed)
Pt O2 sats 100% on 1L via NC. Pt with unlabored even resp. Turned O2 off. Pt now on RA sats 100%

## 2022-08-22 NOTE — Narrator Note (Addendum)
IV therapy team paged to start IV. Pts O2 sats 96-99% Decreased O2 to 3L via NC.

## 2022-08-22 NOTE — Narrator Note (Signed)
Pt stood and pivoted with 2 assist from bed to commode, large soft brown BM. Pt very unsteady. States that she gets around in a wheelchair usually.

## 2022-08-22 NOTE — Narrator Note (Signed)
Offered pt a [place] sandwich but pt refused. Pt updated on ETA for [facility] transfer back to [facility]

## 2022-08-22 NOTE — Discharge Instructions (Addendum)
To make follow up appointment with your primary care doctor or schedule an appointment with  a primary provider from the physician list/number provided to you within 1 day of discharge.    Physician instructions:  In addition to my verbal instructions regarding follow up and return to ER instructions,  the patient should return within 12-24 hours if their symptoms are not resolving,  worsening or they have new or concerning symptoms.

## 2022-08-22 NOTE — Narrator Note (Signed)
IV therapy placed an IV in pts right FA

## 2022-08-22 NOTE — Narrator Note (Signed)
Pt O2 sats 100% on 2L. Decreased O2 to 1L via NC. Pt with unlabored even resp.

## 2022-08-22 NOTE — Narrator Note (Signed)
Pt moved into hall to wait for EMS. Pt adamantly refusing to allow any monitoring including pulse ox.

## 2022-08-22 NOTE — Narrator Note (Addendum)
Pts O2 sats 100% on 3L O2 via NC. Decreased O2 to 2L NC. Pt with unlabored even resp. Pt given gingerale per her request

## 2022-08-22 NOTE — Narrator Note (Signed)
[facility] back to [facility] ETA ~2 hours

## 2022-08-22 NOTE — Progress Notes (Signed)
ABG attempted, unable to obtain. Patient refused the second try. MD notified.

## 2022-08-22 NOTE — ED Notes (Signed)
ED Signout Note:    Received patient in signout. In brief, 51 y/o F w/ edema; no hypoxemia, received diuretic. Discharged, awaiting transport.      Course post-signout: Patient discharged, awaiting transport. Afternoon medication (gabapentin, methocarbamol, suboxone) ordered after confirming dosing on facility paperwork.    ED Course as of 08/22/22 1811   Mon Aug 22, 2022   1810 Patient discharged, awaiting transport. Afternoon medication (gabapentin, methocarbamol, suboxone) ordered after confirming dosing on facility paperwork.       Disposition: Discharge    Ileene Patrick, MD  Emergency Medicine

## 2022-08-22 NOTE — Narrator Note (Signed)
Pt transported back to [facility] via [place]. Report given to [facility]. Attempted to call report to [facility] but they left me on hold. All discharge paperwork sent with pt

## 2022-08-22 NOTE — Narrator Note (Signed)
Offered pt a purewick s/p Bumex administration. Pt refused. BSC at bedside

## 2022-08-22 NOTE — ED Triage Note (Signed)
Pt BIBA from [facility] with multiple complaints. Pt c/o increased SOB, increased BLE edmea, at first pt states that she gained 20# in 2 weeks but then when asking what she weighs pt states that she weighed 250# 2 weeks ago and now weighs 345#. Pt becomes very defensive when questing a 100# weight gain in 2 weeks. Pt uses 2L O2 via NC overnight. Currently on 5L NC and sats 90-93% and will dip down to 88% when falling asleep.    Pt also c/o abd pain and nausea that started today. Pt is asking for pain meds.     Pt is falling asleep while triaging

## 2022-08-22 NOTE — ED Notes (Signed)
Bed: HAL 06-A  Expected date:   Expected time:   Means of arrival:   Comments:  No bed

## 2022-08-22 NOTE — ED Provider Notes (Signed)
eMERGENCY dEPARTMENT Physician NOTE    The ED nursing record was reviewed.   The prior medical records as available electronically through Epic were reviewed.  The mode of arrival was [facility]    Patient presents with:  Breathing Problem      HPI    Cheryl Zimmerman is a 51 year old female history of bipolar disorder, CHF, schizoaffective who presents for shortness of breath.  Patient was sent from [name] with multiple complaints primarily shortness of breath, lower extremity edema and abdominal x-ray.  Patient states that she had an x-ray performed 2 days prior showing pulmonary congestion however was not informed what that meant and she requested to be sent to the hospital.  Patient shortness of breath she reports is chronic.  She is currently on daily Bumex that she is compliant with.  Denies orthopnea, she is currently wheelchair-bound and does not ambulate.      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[place])  12/23/2021: Spinal stenosis of lumbar 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   claudication  No date: Substance abuse ([place])    PROBLEM LIST  Patient Active Problem List:     Dyspnea on minimal exertion     Suicidal behavior with attempted self-injury ([place])     Tobacco use disorder     Systolic congestive heart failure ([name])     Seizure disorder ([name])     Schizoaffective disorder, depressive type ([place])     Suicidal ideation     Afib ([place])     Alcohol use disorder     Borderline personality disorder ([place])     Chronic pain disorder     Morbid obesity ([place])     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 ([place])     PTSD (post-traumatic stress disorder)     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     (HFpEF) heart  failure with preserved ejection fraction ([name])     MDD (major depressive disorder), recurrent, severe, with psychosis ([place])     Requires continuous at home supplemental oxygen     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    CURRENT MEDICATIONS    No current facility-administered medications for this encounter.    Current Outpatient Medications:     prazosin (MINIPRESS) 1 MG capsule, Take 1 capsule by mouth nightly, Disp: 30 capsule, Rfl: 0    mirtazapine (REMERON) 7.5 MG tablet, Take 1 tablet by mouth nightly, Disp: 30 tablet, Rfl: 0    baclofen (LIORESAL) 10 MG tablet, Take 10 mg by mouth in the morning and 10 mg before bedtime., Disp: , Rfl:     Budeson-Glycopyrrol-Formoterol 160-9-4.8 MCG/ACT AERO, Inhale 2 puffs into the lungs in  the morning and 2 puffs before bedtime., Disp: , Rfl:     bumetanide (BUMEX) 1 MG tablet, Take 3 mg by mouth in the morning and 3 mg before bedtime., Disp: , Rfl:     buprenorphine-naloxone (SUBOXONE) 8-2 MG sublingual tablet, Place 1 tablet under the tongue in the morning and 1 tablet at noon and 1 tablet before bedtime., Disp: , Rfl:     fluticasone (FLONASE) 50 MCG/ACT nasal spray, 2 sprays by Each Nostril route in the morning and 2 sprays before bedtime., Disp: , Rfl:     FLUoxetine (PROZAC) 40 MG capsule, Take 40 mg by mouth in the morning., Disp: , Rfl:     hydrOXYzine (ATARAX) 25 MG tablet, Take 25 mg by mouth every 6 (six) hours as needed for Anxiety, Disp: , Rfl:     Melatonin 5 MG TABS Tablet, Take 5 mg by mouth nightly, Disp: , Rfl:     methocarbamol (ROBAXIN) 500 MG tablet, Take 500 mg by mouth in the morning and 500 mg  at noon and 500 mg before bedtime., Disp: , Rfl:     Multiple Vitamin (MULTIVITAMIN) TABS, Take 1 tablet by mouth in the morning., Disp: , Rfl:     pantoprazole (PROTONIX) 40 MG tablet, Take 40 mg by mouth in the morning., Disp: , Rfl:     polyethylene glycol (GLYCOLAX/MIRALAX) 17 g packet, Take 17 g by mouth in the morning., Disp: , Rfl:     Potassium Chloride ER 20 MEQ TBCR, Take 20 mEq by mouth in the morning., Disp: , Rfl:     sennosides (SENOKOT) 8.6 MG tablet, Take 17.2 mg by mouth at bedtime, Disp: , Rfl:     thiamine (VITAMIN B-1) 100 mg tablet, Take 100 mg by mouth in the morning., Disp: , Rfl:     tiotropium (SPIRIVA) 18 MCG inhalation capsule, Inhale 18 mcg into the lungs in the morning., Disp: , Rfl:     OTHER MEDICATION, 1 each by Other route in the morning. Wheelchair: Bariatric motorized wheel chare (height 5'9", weight 328 lb, bmi 48.4) For use as directed.  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 claudication, Gait instability, Morbid obesity ([name]) ICD10:M48.061, R26.81, E66.01., Disp: 1 each, Rfl: 0    atorvastatin (LIPITOR) 80 MG tablet, Take 1 tablet by mouth in the morning., Disp: 30 tablet, Rfl: 0    gabapentin (NEURONTIN) 300 MG capsule, Take 2 capsules by mouth in the morning and 2 capsules at noon and 2 capsules before bedtime., Disp: 180 capsule, Rfl: 0    levETIRAcetam (KEPPRA) 750 MG tablet, Take 1 tablet by mouth in the morning and 1 tablet before bedtime., Disp: 60 tablet, Rfl: 0    levothyroxine (SYNTHROID) 25 MCG tablet, Take 1 tablet by mouth every morning before breakfast, Disp: 30 tablet, Rfl: 0    OXcarbazepine (TRILEPTAL) 150 MG tablet, Take 1 tablet by mouth in the morning and 1 tablet before bedtime., Disp: 60 tablet, Rfl: 0    spironolactone (ALDACTONE) 100 MG tablet, Take 1 tablet by mouth in the morning., Disp: 30 tablet, Rfl: 0    tiotropium (SPIRIVA RESPIMAT) 2.5 MCG/ACT aerosol solution, Inhale 2 puffs into the lungs in  the morning., Disp: 1 each, Rfl: 0    torsemide (DEMADEX) 100 MG tablet, Take 1 tablet by mouth in the morning., Disp: 30 tablet, Rfl: 0    traZODone (DESYREL) 50 MG tablet, Take 1 tablet by mouth nightly as  needed for Sleep, Disp: 30 tablet, Rfl: 0    albuterol HFA 108 (90 Base) MCG/ACT inhaler, Inhale 2 puffs into the lungs every 6 (six) hours as needed for Wheezing, Disp: 1 each, Rfl: 0    ALLERGIES    Review of Patient's Allergies indicates:   Bee venom               Anaphylaxis   Metolazone              Other (See Comments)    Comment:Hypokalemia   Methylprednisolone      Dizziness, Drowsiness    Comment:Also believes syncope. Seems to tolerate             inhalational and epidural steroid.   Nutritional supplem*       Hydroxyzine             Nausea Only    FAMILY HISTORY    No family history on file.      SOCIAL HISTORY    Social History     Socioeconomic History    Marital status: Divorced     Spouse name: Not on file    Number of children: Not on file    Years of education: Not on file    Highest education level: Not on file   Occupational History    Not on file   Tobacco Use    Smoking status: Every Day     Packs/day: 3.00     Years: 30.00     Additional pack years: 0.00     Total pack years: 90.00     Types: Cigarettes     Passive exposure: Current    Smokeless tobacco: Never    Tobacco comments:     Pt currently on nicotine patch and gum   Vaping Use    Vaping Use: Not on file   Substance and Sexual Activity    Alcohol use: Yes     Comment: 2 liters of captain morgan daily    Drug use: Yes     Types: Marijuana, Cocaine    Sexual activity: Yes     Partners: Male   Other Topics Concern    Not on file   Social History Narrative    Not on file   Social Determinants of Health  Financial Resource Strain: Not on file  Food Insecurity: Not on file  Transportation Needs: Not on file  Physical Activity: Not on file  Stress: Not on file  Social Connections: Not on file  Intimate Partner Violence: Not on  file  Housing Stability: Not on file    REVIEW OF SYSTEMS    The pertinent positives are reviewed in the HPI above. All other systems were reviewed and are negative.    PHYSICAL EXAM      BP 113/69   Pulse 93   Temp 98.3 F   Resp 20   Wt (!) 156.5 kg (345 lb)   SpO2 100%   BMI 50.95 kg/m   GENERAL: Well-appearing, no distress, non-toxic   HEENT: Atraumatic, PERRL. EOMI. Normal conjunctiva  Oropharynx clear.  NECK:   Supple with full painless ROM at the neck  LUNGS: Clear to auscultation bilaterally without rales, rhonchi or wheezing.   HEART: 1+ pitting edema from the ankles extend to the knees bilaterally regular rate and rhythm.  No murmurs, rubs, or gallops.   ABDOMEN:  Soft, flat, without distension.  Nontender to palpation.       RESULTS  Results  for orders placed or performed during the hospital encounter of 08/22/22 (from the past 24 hour(s))   CBC, Platelet & Differential    Collection Time: 08/22/22 10:18 AM   Result Value    WHITE BLOOD CELL COUNT 10.2    RED BLOOD CELL COUNT 4.81    HEMOGLOBIN 14.5    HEMATOCRIT 45.3 (H)    MEAN CORPUSCULAR VOL 94.2    MEAN CORPUSCULAR HGB 30.1    MEAN CORP HGB CONC 32.0    RBC DISTRIBUTION WIDTH STD DEV 47.8 (H)    PLATELET COUNT 289    MEAN PLATELET VOLUME 10.8    NEUTROPHIL % 82.0 (H)    IMMATURE GRANULOCYTE % 0.4    LYMPHOCYTE % 10.3 (L)    MONOCYTE % 6.5    EOSINOPHIL % 0.6    BASOPHIL % 0.2    NRBC % 0.0    ABSOLUTE NEUTROPHIL COUNT 8.4 (H)    ABSOLUTE IMM GRAN COUNT 0.04    ABSOLUTE LYMPH COUNT 1.1    ABSOLUTE MONO COUNT 0.7    ABSOLUTE EOSINOPHIL COUNT 0.1    ABSOLUTE BASO COUNT 0.0    ABSOLUTE NRBC COUNT 0.0   Basic Metabolic Panel    Collection Time: 08/22/22 10:18 AM   Result Value    SODIUM 143    POTASSIUM 3.7    CHLORIDE 99    CARBON DIOXIDE 33 (H)    ANION GAP 12    CALCIUM 9.4    Glucose Random 132    BUN (UREA NITROGEN) 11    CREATININE 0.8    ESTIMATED GLOMERULAR FILT RATE > 60   Troponin T HS Random    Collection Time: 08/22/22 10:18 AM   Result  Value    TROPONIN T HS RANDOM 12 (H)   NT-proBNP (replaces BNP)    Collection Time: 08/22/22 10:18 AM   Result Value    NT-proBNP < 36          MEDICATIONS ADMINISTERED ON THIS VISIT  Orders Placed This Encounter      bumetanide (BUMEX) injection 1 mg        ED COURSE & MEDICAL DECISION MAKING      This is a 51 year old history as above who presents for shortness of breath and lower extremity swelling.  She is alert and orient x 3 and in no obvious distress, hemodynamically stable.  Patient does have obesity and OSA for which she uses 2 L of supplemental oxygen only at night.  Patient has not required additional submental oxygen over the last several days.  She presents she is hemodynamically stable with a pulse ox of 90% on room air.  Her lungs otherwise clear to auscultation, no murmurs rubs or gallops noted.  EKG shows no signs of acute ischemia.  Chest x-ray obtained and interpreted by me and shows no evidence of pulmonary edema no infectious process.  No acute abnormalities noted.  Lance Bosch found to be 12 however on chart review her troponin is chronically elevated likely secondary to CHF and OSA.  She has no chest pain at this time.  Attempted to obtain ABG as she was found to be hypoxic however patient declined.  Patient throughout her ED stay was on 2 L was subsequently completely weaned off and was observed for several hours without requiring oxygen.  She feels significantly better.  Patient given her home dose of Bumex 1 mg through the IV and urinated several times.  She feels significantly better.  No indication  for admission at this time.  Will be discharged home      I reviewed the patient's past medical history/problem list, past surgical history, medication list, social history and allergies. Pt remained hemodynamically stable during their stay in the emergency department.       Diagnosis:   Shortness of breath    Ralene Bathe. Jahnai Slingerland, MD   Site Chief, Emergency [place]

## 2022-08-29 ENCOUNTER — Encounter (HOSPITAL_BASED_OUTPATIENT_CLINIC_OR_DEPARTMENT_OTHER): Payer: Self-pay

## 2022-08-29 NOTE — Telephone Encounter (Signed)
Outreach to patient as per ED F/U. She was treated and released by ED for SOB. I called mobile number listed and voicemail was full, unable to leave message. I then called home number listed and patient lives at SNF, they transferred me to her room extension but she never answered. I called back at the end of the day and spoke to the DON. He said that since patient has been back from ED has been doing much better and is busy with ADLs and daily errands. She is working towards subsidized housing and getting her license back from [employer]. He assured me if patient needed any F/U the SNF would be in contact with [place], if not the patient.He had no further concerns for patient. Roger Kill. Lavigne-Cordeiro, LPN

## 2022-09-29 ENCOUNTER — Ambulatory Visit: Payer: No Typology Code available for payment source | Admitting: Family Medicine

## 2022-09-29 DIAGNOSIS — Z993 Dependence on wheelchair: Secondary | ICD-10-CM | POA: Diagnosis not present

## 2022-09-29 DIAGNOSIS — Z79899 Other long term (current) drug therapy: Secondary | ICD-10-CM

## 2022-09-29 DIAGNOSIS — R54 Age-related physical debility: Secondary | ICD-10-CM | POA: Diagnosis not present

## 2022-09-29 DIAGNOSIS — Z09 Encounter for follow-up examination after completed treatment for conditions other than malignant neoplasm: Secondary | ICD-10-CM

## 2022-09-29 NOTE — Progress Notes (Signed)
Cheryl Zimmerman is a 52 year old female that presents today for:     S:    1.) visiting nurse  Pt is in nursing home/rehab- [facility] sent pt over, previously homeless  Pt will be moving [place] next week, found housing   Limited mobility, cannot walk - poor balance and spinal stenosis   Pt is looking for visiting nurse to administer medications   Feeling great, stable   Mood is good   No other concerns       ROS  As per hpi     Patient Active Problem List:     Dyspnea on minimal exertion     Suicidal behavior with attempted self-injury ([place])     Tobacco use disorder     Systolic congestive heart failure ([name])     Seizure disorder ([place])     Schizoaffective disorder, depressive type ([place])     Suicidal ideation     Afib ([place])     Alcohol use disorder     Borderline personality disorder ([place])     Chronic pain disorder     Morbid obesity ([place])     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 ([place])     PTSD (post-traumatic stress disorder)     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     (HFpEF) heart failure with preserved ejection fraction ([name])     MDD (major depressive disorder), recurrent, severe, with psychosis ([employer])     Requires continuous at home supplemental oxygen     Spinal stenosis of lumbar 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 claudication     Fall in home     Gait instability     Hypokalemia     Lung nodule     Stable angina     Chest pain, atypical     Obesity hypoventilation syndrome ([place])     Osteoarthritis of spine with radiculopathy, lumbar region     Chronic bilateral low back pain with bilateral sciatica        Current Outpatient Medications:     prazosin (MINIPRESS) 1 MG capsule, Take 1 capsule by mouth nightly, Disp: 30 capsule, Rfl: 0    mirtazapine (REMERON) 7.5 MG tablet, Take 1 tablet by mouth nightly, Disp: 30 tablet, Rfl: 0    baclofen (LIORESAL) 10 MG tablet, Take 10 mg by mouth in the morning and 10 mg before bedtime., Disp: , Rfl:     Budeson-Glycopyrrol-Formoterol 160-9-4.8 MCG/ACT AERO, Inhale 2 puffs into the  lungs in the morning and 2 puffs before bedtime., Disp: , Rfl:     bumetanide (BUMEX) 1 MG tablet, Take 3 mg by mouth in the morning and 3 mg before bedtime., Disp: , Rfl:     buprenorphine-naloxone (SUBOXONE) 8-2 MG sublingual tablet, Place 1 tablet under the tongue in the morning and 1 tablet at noon and 1 tablet before bedtime., Disp: , Rfl:     fluticasone (FLONASE) 50 MCG/ACT nasal spray, 2 sprays by Each Nostril route in the morning and 2 sprays before bedtime., Disp: , Rfl:     FLUoxetine (PROZAC) 40 MG capsule, Take 40 mg by mouth in the morning., Disp: , Rfl:     hydrOXYzine (ATARAX) 25 MG tablet, Take 25 mg by mouth every 6 (six) hours as needed for Anxiety, Disp: , Rfl:     Melatonin 5 MG TABS Tablet, Take 5 mg by mouth nightly, Disp: , Rfl:     methocarbamol (ROBAXIN) 500 MG tablet, Take 500 mg by mouth in the morning and  500 mg at noon and 500 mg before bedtime., Disp: , Rfl:     Multiple Vitamin (MULTIVITAMIN) TABS, Take 1 tablet by mouth in the morning., Disp: , Rfl:     pantoprazole (PROTONIX) 40 MG tablet, Take 40 mg by mouth in the morning., Disp: , Rfl:     polyethylene glycol (GLYCOLAX/MIRALAX) 17 g packet, Take 17 g by mouth in the morning., Disp: , Rfl:     Potassium Chloride ER 20 MEQ TBCR, Take 20 mEq by mouth in the morning., Disp: , Rfl:     sennosides (SENOKOT) 8.6 MG tablet, Take 17.2 mg by mouth at bedtime, Disp: , Rfl:     thiamine (VITAMIN B-1) 100 mg tablet, Take 100 mg by mouth in the morning., Disp: , Rfl:     tiotropium (SPIRIVA) 18 MCG inhalation capsule, Inhale 18 mcg into the lungs in the morning., Disp: , Rfl:     OTHER MEDICATION, 1 each by Other route in the morning. Wheelchair: Bariatric motorized wheel chare (height 5'9", weight 328 lb, bmi 48.4) For use as directed.  Please fax to [facility] at [phone] DX: Spinal stenosis of lumbar 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 claudication, Gait instability, Morbid obesity ([name]) ICD10:M48.061, R26.81, E66.01., Disp: 1 each, Rfl: 0     atorvastatin (LIPITOR) 80 MG tablet, Take 1 tablet by mouth in the morning., Disp: 30 tablet, Rfl: 0    gabapentin (NEURONTIN) 300 MG capsule, Take 2 capsules by mouth in the morning and 2 capsules at noon and 2 capsules before bedtime., Disp: 180 capsule, Rfl: 0    levETIRAcetam (KEPPRA) 750 MG tablet, Take 1 tablet by mouth in the morning and 1 tablet before bedtime., Disp: 60 tablet, Rfl: 0    levothyroxine (SYNTHROID) 25 MCG tablet, Take 1 tablet by mouth every morning before breakfast, Disp: 30 tablet, Rfl: 0    OXcarbazepine (TRILEPTAL) 150 MG tablet, Take 1 tablet by mouth in the morning and 1 tablet before bedtime., Disp: 60 tablet, Rfl: 0    spironolactone (ALDACTONE) 100 MG tablet, Take 1 tablet by mouth in the morning., Disp: 30 tablet, Rfl: 0    tiotropium (SPIRIVA RESPIMAT) 2.5 MCG/ACT aerosol solution, Inhale 2 puffs into the lungs in the morning., Disp: 1 each, Rfl: 0    torsemide (DEMADEX) 100 MG tablet, Take 1 tablet by mouth in the morning., Disp: 30 tablet, Rfl: 0    traZODone (DESYREL) 50 MG tablet, Take 1 tablet by mouth nightly as needed for Sleep, Disp: 30 tablet, Rfl: 0    albuterol HFA 108 (90 Base) MCG/ACT inhaler, Inhale 2 puffs into the lungs every 6 (six) hours as needed for Wheezing, Disp: 1 each, Rfl: 0    Review of Patient's Allergies indicates:   Bee venom               Anaphylaxis   Metolazone              Other (See Comments)    Comment:Hypokalemia   Methylprednisolone      Dizziness, Drowsiness    Comment:Also believes syncope. Seems to tolerate             inhalational and epidural steroid.   Nutritional supplem*       Hydroxyzine             Nausea Only    O:    Most Recent BP Reading(s)  08/22/22 : 131/83  07/20/22 : 109/67  07/15/22 : 111/70  04/12/22 : 122/81  04/09/22 :  119/83          Most Recent Weight Reading(s)  08/22/22 : (!) 156.5 kg (345 lb)  07/16/22 : (!) 155.5 kg (342 lb 12.8 oz)  07/15/22 : (!) 149 kg (328 lb 7.8 oz)  04/09/22 : (!) 149 kg (328 lb 6.4  oz)  04/07/22 : (!) 152.4 kg (336 lb)                    Physical Exam  Patient speech is normal and able to speak in clear sentences.        A/P:    (Z09) Hospital discharge follow-up  (primary encounter diagnosis)  (Z79.899) Medication management  (R54) Frailty  (Z99.3) Wheelchair dependent  Comment: Pt reports she has been in nursing home/rehab and is getting discharged next week. Was previously homeless and pt found housing in [place]. Has been very difficult to find VNA services which has been delaying the discharge. Pt is ready to go home and is feeling very well. Would like assistance finding VNA to assist with administering medications. Pt is unsure about current medications- will bring paperwork home and review with RN.   Plan: REFERRAL TO VISITING NURSE SERVICES (EXT)          Callback precautions discussed and all questions answered.     On the day of service, I spent 30-39 minutes caring for this patient, including time required for chart review, face-to-face patient evaluation, entering orders, counseling, and documentation of the encounter.    Madaline Savage, PA-C

## 2022-09-30 ENCOUNTER — Other Ambulatory Visit (HOSPITAL_BASED_OUTPATIENT_CLINIC_OR_DEPARTMENT_OTHER): Payer: Self-pay

## 2022-09-30 NOTE — Telephone Encounter (Signed)
Suboxone and cymbalta last prescribed by ED, please review strengths and frequency.    Thanks!

## 2022-09-30 NOTE — Telephone Encounter (Signed)
-----  Message from [place], [place] sent at 09/30/2022 10:54 AM EST -----  Regarding: Dag[place] [id number], 52 year old, female    Calls today: Patient called requested to speak with RN in regard of  a referral for [facility] that PA [name] placed yesterday. She was told by PA to call clinic to know the status of the referral order.     Person calling on behalf of patient: Patient (self)    CALL BACK NUMBER: [phone]    Best time to call back: Anytime   Cell phone:   Other phone:    Patient's language of care: English    Patient does not need an interpreter.    Patient's PCP: Cora Collum, MD    Primary Care Home Site:  [facility]

## 2022-09-30 NOTE — Telephone Encounter (Addendum)
Regards: Referral for VISITING NURSE SERVICES     Received the above message, placed call to pt. Pt verified.    Per chart review pt had a tv yesterday on 01/11 with PA [name].  Pt cancelling VNA request, states the nursing home she is currently found her a VNA.  Pt to be discharge from nursing home on 10/05/2022.  Pt requesting tv with Dr Mady Haagensen for medicines refill for Suboxone & Cymbalta. States has been getting Suboxone 8mg  3x daily at the nursing home. States nursing home denied Cymbalta and that she took Cymbalta last time four months ago.  Pt states has been taking Suboxone for 3 year and that Dr Mady Haagensen was to taper down suboxone and start her on Oxycodone due to lower back pain, but because pt went to nursing home it didn't happened.    Pt has no further questions or concerns at this time.    No tv available with Dr Mady Haagensen for next week.  Tv scheduled with Dr Berenice Primas for 01/18 at 13:40.  Routing the encounter to Dr Mady Haagensen, Dr Berenice Primas and [facility].    Placed call to [facility] to cancel referral.     Carolan Clines, RN, 09/30/2022

## 2022-09-30 NOTE — Addendum Note (Signed)
Addended by: Frances Furbish on: 09/30/2022 04:03 PM     Modules accepted: Orders

## 2022-10-02 MED ORDER — BUPRENORPHINE HCL-NALOXONE HCL 8-2 MG SL SUBL
1.00 | SUBLINGUAL_TABLET | Freq: Three times a day (TID) | SUBLINGUAL | 0 refills | Status: AC
Start: 2022-10-02 — End: 2022-10-09

## 2022-10-02 MED ORDER — DULOXETINE HCL 40 MG PO CPEP
40.0000 mg | ORAL_CAPSULE | Freq: Every day | ORAL | 2 refills | Status: DC
Start: 2022-10-02 — End: 2022-12-01

## 2022-10-03 ENCOUNTER — Emergency Department
Admission: EM | Admit: 2022-10-03 | Discharge: 2022-10-03 | Disposition: A | Discharge status: Home / Self Care | Payer: No Typology Code available for payment source | Attending: Emergency Medicine | Admitting: Emergency Medicine

## 2022-10-03 DIAGNOSIS — M62838 Other muscle spasm: Secondary | ICD-10-CM | POA: Diagnosis present

## 2022-10-03 MED ORDER — DIAZEPAM 10 MG PO TABS
10.00 mg | ORAL_TABLET | Freq: Once | ORAL | Status: AC
Start: 2022-10-03 — End: 2022-10-03
  Administered 2022-10-03: 10 mg via ORAL
  Filled 2022-10-03: qty 1

## 2022-10-03 NOTE — Narrator Note (Signed)
Pt continues to be agitated, requesting to go to the bathroom. Standby assistance to bedside commode. One large formed bowel movement with small amount of urine.

## 2022-10-03 NOTE — ED Notes (Signed)
Bed: 11-A  Expected date:   Expected time:   Means of arrival:   Comments:  EMS

## 2022-10-03 NOTE — ED Provider Notes (Signed)
I have reviewed the ED nursing notes and prior records. I have reviewed the patient's past medical history/problem list, allergies, social history and medication list.  I saw this patient primarily.    ED Course and Medical Decision-making:    History provided by patient.    52 year old female with history of bipolar disorder and borderline personality disorder presenting with muscle spasms.  Patient is well-appearing and nontoxic on exam.  Vital signs within normal limits.  Valium given for intermittent muscle spasms.  Likely symptoms behavioral.  No red flag or concerning symptoms or signs on exam.  No recent vomiting or diarrhea or other symptoms to indicate any blood work at this time.     My interpretation of previous notes: Televisit reviewed from 08/09/2022 where patient was seen for chronic bilateral back pain and had an MRI at that time with degenerative changes.    I have reviewed the patient's history, physical exam, and diagnostic testing results and determined the patient can be safely discharged with outpatient follow-up. Patient was advised to follow-up with their family doctor and to return to the ED with specific return precautions. Patient expresses understanding of discharge plan.          While in the ED patient received:   Medications   diazepam (VALIUM) tablet 10 mg (10 mg Oral Given 10/03/22 1621)       Current Discharge Medication List        Patient Vitals for the past 24 hrs:   BP Temp Temp src Pulse Resp SpO2 Weight   10/03/22 1800 134/98 -- -- 76 20 93 % --   10/03/22 1620 -- -- -- 86 20 95 % --   10/03/22 1604 144/97 97.8 F Oral 100 (!) 22 94 % (!) 158.8 kg (350 lb)       Reasons to return to the ED were reviewed in detail. The patient agrees with this plan and disposition.    Disposition: Discharge    Patient Condition: Stable     Initial Impression:  Muscle spasm    HPI:  52 year old female with history of bipolar disorder, borderline personality disorder presenting to [facility] via [facility] with Body Aches and Agitation  .  Patient presenting with a muscle spasm.  She states she has had this issue for the past few hours.  She notes that the muscle spasm is causing her to have body aches.  She denies any fever, chest pain, shortness of breath, nausea, numbness, or weakness.      Triage Documentation       Velora Heckler, RN 10/03/2022 16:07                 Pt BIBA from [facility] with c/o "my body has tremors". EMS reports being called for "convulsions and chest pain". Pt denies chest pain at time of triage, reports "body pain". Pt having what appears to be intermittent full body tremors/convulsions but is able to control body movement when asked to remain still for assessment/vital signs. Pt agitated and refusing to answer triage questions.            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[place])  12/23/2021: Spinal stenosis of lumbar  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   claudication  No date: Substance abuse [facility])  Patient Active Problem List:     Dyspnea on minimal exertion     Suicidal behavior with attempted self-injury ([name])     Tobacco use disorder     Systolic congestive heart failure ([name])     Seizure disorder ([name])     Schizoaffective disorder, depressive type ([name])     Suicidal ideation     Afib ([name])     Alcohol use disorder     Borderline personality disorder ([name])     Chronic pain disorder     Morbid obesity ([name])     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)     PTSD (post-traumatic stress disorder)     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     (HFpEF) heart failure with preserved ejection fraction ([name])     MDD (major depressive disorder), recurrent, severe, with psychosis  ([name])     Requires continuous at home supplemental oxygen     Spinal stenosis of lumbar 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 claudication     Fall in home     Gait instability     Hypokalemia     Lung nodule     Stable angina     Chest pain, atypical     Obesity hypoventilation syndrome ([name])     Osteoarthritis of spine with radiculopathy, lumbar region     Chronic bilateral low back pain with bilateral sciatica    Past Surgical History:   No past surgical history on file.  Social History:   Social History     Socioeconomic History    Marital status: Divorced     Spouse name: Not on file    Number of children: Not on file    Years of education: Not on file    Highest education level: Not on file   Occupational History    Not on file   Tobacco Use    Smoking status: Every Day     Packs/day: 3.00     Years: 30.00     Additional pack years: 0.00     Total pack years: 90.00     Types: Cigarettes     Passive exposure: Current    Smokeless tobacco: Never    Tobacco comments:     Pt currently on nicotine patch and gum   Vaping Use    Vaping Use: Not on file   Substance and Sexual Activity    Alcohol use: Yes     Comment: 2 liters of [place] daily    Drug use: Yes     Types: Marijuana, Cocaine    Sexual activity: Yes     Partners: Male   Other Topics Concern    Not on file   Social History Narrative    Not on file   Social Determinants of Health  Financial Resource Strain: Not on file  Food Insecurity: Not on file  Transportation Needs: Not on file  Physical Activity: Not on file  Stress: Not on file  Social Connections: Not on file  Intimate Partner Violence: Not on file  Housing Stability: Not on file  Allergies:  Review of Patient's Allergies indicates:   Bee venom               Anaphylaxis   Metolazone              Other (See Comments)    Comment:Hypokalemia   Methylprednisolone  Dizziness, Drowsiness    Comment:Also believes syncope. Seems to tolerate             inhalational and epidural steroid.   Nutritional  supplem*       Hydroxyzine             Nausea Only    ROS:   Positive systems reviewed in HPI above.  All other systems were reviewed and were negative.    Physical Exam:  ED Triage Vitals [10/03/22 1604]   ED Triage Vitals Brief Group      Temp 97.8 F      Pulse 100      Resp (!) 22      BP 144/97      SpO2 94 %      Pain Score 5      GENERAL:  Well-appearing, no distress   SKIN:  Warm & dry, no rash  HEENT:  Atraumatic   NECK:  Supple   LUNGS:  Equal and bilateral chest rise  CV: Warm and well perfused  ABDOMEN:  Soft, nontender, nondistended  MUSCULOSKELETAL:  No deformities   NEUROLOGIC:  Normal speech.  Moving all four extremities.  PSYCHIATRIC: Agitated affect, no SI/HI, intermittently moves upper and lower extremities in a spasming motion that appears volitional      Medications Given in the ED:    Medications   diazepam (VALIUM) tablet 10 mg (10 mg Oral Given 10/03/22 1621)       Lab Results:     Labs Reviewed - No data to display            Mendel Ryder, MD  [facility]  Emergency Medicine Attending Physician  10/04/2022 3:24 PM     Portions of this chart may have been created with Dragon voice recognition software. Occasional wrong-word or "sound-alike" substitutions may have occurred due to the inherent limitations of voice recognition software. Please read the chart carefully and recognize, using context, where these substitutions have occurred.

## 2022-10-03 NOTE — Narrator Note (Addendum)
Attempted report to [facility], no answer.

## 2022-10-03 NOTE — Narrator Note (Signed)
Patient Disposition  Patient education for diagnosis, medications, activity, diet and follow-up.  Patient left ED 6:11 PM.  Patient rep received written instructions.    Interpreter to provide instructions: No    Patient belongings with patient: YES    Have all existing LDAs been addressed? N/A    Have all IV infusions been stopped? N/A    Destination: Transferred outside to facility. Transferred by BLS ambulance back to [name].

## 2022-10-03 NOTE — Narrator Note (Signed)
Pt yelling out from room despite having call bell within reach. When I entered room, pt reports feeling "like I'm hyperventilating" and "nobody is helping me". Pt argumentative with this nurse. Coached patient on breathing exercises so that she can feel more in control of her breathing. Pt found to have adequate oxygen saturations and is not tachypneic. She does not appear to be in any respiratory distress.

## 2022-10-03 NOTE — ED Triage Note (Signed)
Pt BIBA from [facility] with c/o "my body has tremors". EMS reports being called for "convulsions and chest pain". Pt denies chest pain at time of triage, reports "body pain". Pt having what appears to be intermittent full body tremors/convulsions but is able to control body movement when asked to remain still for assessment/vital signs. Pt agitated and refusing to answer triage questions.

## 2022-10-04 ENCOUNTER — Ambulatory Visit (HOSPITAL_BASED_OUTPATIENT_CLINIC_OR_DEPARTMENT_OTHER): Payer: No Typology Code available for payment source | Admitting: Physician Assistant

## 2022-10-06 ENCOUNTER — Encounter (HOSPITAL_BASED_OUTPATIENT_CLINIC_OR_DEPARTMENT_OTHER): Payer: Self-pay

## 2022-10-06 ENCOUNTER — Ambulatory Visit: Payer: No Typology Code available for payment source | Admitting: Internal Medicine

## 2022-10-06 DIAGNOSIS — Z5321 Procedure and treatment not carried out due to patient leaving prior to being seen by health care provider: Secondary | ICD-10-CM

## 2022-10-06 NOTE — Progress Notes (Signed)
Pt's home number appears to be a residential facility.  A staffer picked up and transferred me to pt's line.  It rang several times without pick up.  There was no option to leave a VM message.    Pt's mobile # is full, unable to leave a message.

## 2022-10-06 NOTE — Telephone Encounter (Signed)
Pt was seen in ED for muscle spasms on 10/03/22.   Had appt scheduled for 10/04/22 with provider however pt cancelled.   Pt had TV scheduled today with provider and no showed.   Pt has hospital follow up appt scheduled 10/11/22.     RN call phone number on file and reached [facility].   Spoke to pts nurse who states that  pt is being discharged from nursing home today.   RN informed nurse and pt that appt has been scheduled for 10/11/22.   Pt confirms that she will be able to attend appt.     [name] [facility]

## 2022-10-11 ENCOUNTER — Ambulatory Visit (HOSPITAL_BASED_OUTPATIENT_CLINIC_OR_DEPARTMENT_OTHER): Payer: No Typology Code available for payment source | Admitting: Physician Assistant

## 2022-10-13 ENCOUNTER — Telehealth (HOSPITAL_BASED_OUTPATIENT_CLINIC_OR_DEPARTMENT_OTHER): Payer: Self-pay | Admitting: Registered Nurse

## 2022-10-13 DIAGNOSIS — F251 Schizoaffective disorder, depressive type: Secondary | ICD-10-CM

## 2022-10-13 DIAGNOSIS — F1120 Opioid dependence, uncomplicated: Secondary | ICD-10-CM

## 2022-10-13 DIAGNOSIS — G40909 Epilepsy, unspecified, not intractable, without status epilepticus: Secondary | ICD-10-CM

## 2022-10-13 DIAGNOSIS — F603 Borderline personality disorder: Secondary | ICD-10-CM

## 2022-10-13 DIAGNOSIS — M4726 Other spondylosis with radiculopathy, lumbar region: Secondary | ICD-10-CM

## 2022-10-13 DIAGNOSIS — I4891 Unspecified atrial fibrillation: Secondary | ICD-10-CM

## 2022-10-13 DIAGNOSIS — I5032 Chronic diastolic (congestive) heart failure: Secondary | ICD-10-CM

## 2022-10-13 DIAGNOSIS — F4310 Post-traumatic stress disorder, unspecified: Secondary | ICD-10-CM

## 2022-10-13 NOTE — Telephone Encounter (Signed)
Called [facility] [phone]  Spoke to [place] who states pt's [facility] is:  Cheryl Zimmerman [facility]) [phone]  Attempted to call Leveda Anna but call did not go through. Unable to leave message.    Margit Hanks, RN 10/13/22 4:40 PM

## 2022-10-13 NOTE — Telephone Encounter (Signed)
Returned call to [employer] at [employer].  She was discharged from [facility] [phone]) on Tuesday 10/11/22.    She has no hospital bed, no food, no spoon, no fork, no furniture, no VNA service initiated at time of discharge. She has Wheelchair. Lives by herself.   In rehab for about 9 months.  Pt has [facility] and name of nurse is Leveda Anna.    New address:   [address]  [place], [place] [address]    Media was able to bring a table and lay out her 23 medication on the table to help pt take her medication.  Requesting urgent request for VNA service.    Advised will route message to pt's PCP and next visiting provider for VNA referral request.  Genine voiced understanding and no further questions at this time.    Margit Hanks, RN 10/13/22 3:45 PM

## 2022-10-13 NOTE — Telephone Encounter (Signed)
-----  Message from Annette Stable sent at 10/13/2022  2:49 PM EST -----  Cheryl Zimmerman [id number], 52 year old, female    Calls today:  Genine the nurse from the [facility] is requesting a phone call stated that patient was discharge on Tuesday from the hospital to an empty home with no beds , numerous medication that she has nothing about on how to take them and no VNA services set up.     Person calling on behalf of patient: Bethena Roys NUMBER: [phone]  Best time to call back: any  Cell phone:   Other phone:    Patient's language of care: English    Patient does not need an interpreter.    Patient's PCP: Cora Collum, MD    Primary Care Home Site:  [facility]

## 2022-10-14 ENCOUNTER — Ambulatory Visit (HOSPITAL_BASED_OUTPATIENT_CLINIC_OR_DEPARTMENT_OTHER): Payer: No Typology Code available for payment source | Admitting: Physician Assistant

## 2022-10-14 ENCOUNTER — Ambulatory Visit (HOSPITAL_BASED_OUTPATIENT_CLINIC_OR_DEPARTMENT_OTHER): Payer: No Typology Code available for payment source | Admitting: Internal Medicine

## 2022-10-14 NOTE — Telephone Encounter (Signed)
RN called pt who stated "My whole table is full of medication. I have a hard time manging my meds. I'm trying to mange on my own."  VNA hasn't shown up and was advised VNA doesn't take pt's insurance.  Requesting VNA service.  RN reminded her of upcoming appt on 10/25/22 with Margot Ables and of [facility] transportation. Pt was upset appointment was scheduled without her being told.  She hasn't spoken to [place] and states she is not in today.  RN reviewed we are trying to find her VNA service.   Pt then said "Stop treating me like a child" and hung up the phone.     Margit Hanks, RN 10/14/22 2:17 PM

## 2022-10-14 NOTE — Telephone Encounter (Signed)
RN called [facility] [place] [phone] to initiate VNA service  Intake dept not open. Left message to Amie [place] voicemail  to callback to [facility].    Margit Hanks, RN 10/14/22 8:20 AM      Called [facility]  Address: [address] [place], [place] [address]  Admin Voice: [phone] Admin Fax: [phone]  Intake Voice: [phone] Intake Fax: [phone]     Spoke to [place]. Intake dept not answering. Mardene Celeste took message and advised intake dept to call back to [facility] with phone number provided.    Margit Hanks, RN 10/14/22 8:23 AM

## 2022-10-14 NOTE — Telephone Encounter (Signed)
[facility] [phone]   They do NOT take pt's insurance    Cheryl Hanks, RN 10/14/22 2:52 PM      Called [facility] [phone]   They don't service pts in [place], [place]    Cheryl Zimmerman [place], [place] 10/14/22 2:55 PM

## 2022-10-14 NOTE — Telephone Encounter (Signed)
-----  Message from [facility] [place] sent at 10/13/2022  6:59 PM EST -----  Regarding: FW: PT 1  Can the caseworker help her get this set up so that we may be able to arrange for an in person appointment?  ----- Message -----  From: Judithann Graves  Sent: 10/13/2022   6:48 PM EST  To: Margot Ables, PA-C  Subject: RE: PT 1                                         Hi Meghan,     Patient does not qualify for PT-1. Patient can call [facility] [place] insurance for a ride to clinic.         ----- Message -----  From: Margot Ables, PA-C  Sent: 10/13/2022   6:21 PM EST  To: [place] [facility] Desk Pool  Subject: PT 1                                             Hi team,    Can we follow-up and see if he can set up with PT 1 for this patient to be up to get transportation to the clinic.    Please and thank you  Meghan

## 2022-10-14 NOTE — Telephone Encounter (Addendum)
Called [facility] [phone].  Spoke to [employer] updated information on pt's new home address.  She is not able to connect with Lance Morin  Advised to speak with Jodi's manager as not able to connect with her.     She states Leveda Anna is Systems analyst. Another phone number given to call her: [phone]. Called Leveda Anna. No answer. Left message on machine for callback.  Margit Hanks, RN 10/14/22 1:45 PM      Called [place] and they do NOT take [employer].      [facility] [phone]  No answer. Left message on machine for callback to [facility] clinic.    Called [facility] [phone]. They do take pt in [place] but not sure if they take her insurance. But advised to fax over documents and they will take a look at [phone].    RN printed and faxed documents required, face sheet, med list, visit note and VNA referral.    Margit Hanks, RN 10/14/22 1:44 PM

## 2022-10-14 NOTE — Telephone Encounter (Signed)
Called pt's number [phone].  That number is actually to [name] in [place] states pt was on the 6th floor.   This RN advised to speak to Midwife.  Call was directed, on hold for more than 5+ min.    Transferred to Case Agricultural engineer.  She states, "We had difficult time discharging this patient and finding VNA service."  [facility] was suppose to go visit her and they states they take pt's insurance.  Discharge paperwork was sent there    Cheryl Zimmerman was out sick when pt was discharge that day.    Following morning sent paperwork herself to VNA and to [facility].  When they patient arrived to her home she called Cheryl Zimmerman and stated VNA did not show up. VNA then stated they don't take pt's insurance.    Pt was suppose to get furniture through [facility] program. They advised pt it was going to take a while.  Pt stated to [facility] she was fine with that and was going to get air mattress until furniture arrived. Pt stated she was going to get utensils from [employer].    Cheryl Zimmerman also put in service for [place] [facility] to deliver food from the food pantry.    She advised to contact [facility] who stated they were full at the time but take pt's insurance and location.    Pt in wheelchair but she can transfer independely.     Cheryl Zimmerman also stated it has been difficult to reach pt's care partner Cheryl Zimmerman.  Libby Maw to refax discharge paper work to [facility] clinic Attn: Cheryl Zimmerman. Confirmed [facility] fax number.     Cheryl Hanks, RN 10/14/22 2:31 PM

## 2022-10-14 NOTE — Telephone Encounter (Addendum)
[place] [phone]   Not able to speak to any nurse. Voice mailbox states not to leave message as they do not check everyday.    Margit Hanks, RN 10/14/22 10:01 AM    Called [facility]  Address: [address], [place], [place] [address]  Admin Voice: [phone] Admin Fax: [phone]  Intake Voice: [phone] Intake Fax: [phone]     VNA does take pt who live in [place], [place]  No one answered. Message taken to call back Education officer, community from [facility].    Margit Hanks, RN 10/14/22 10:03 AM      Attempted to call [facility] [phone] regarding transportation to next in person appt.Spoke Rashina from transportation.   Confirmed pick up location and time/ date of appt.  They will pick up pt at 10:30am from [place] address on 10/25/22 and pick her up from [facility] clinic at 1p.  [facility] did not have her new address on file.     Would need to Call transportation 3 days before appointment to confirm ride and there is driver who can pick her up. RN will postpone message with reminder to confirm transportation on 10/21/22.    Reference #[id number]    Attempted to call [facility] for provider line. On hold for more than 10+ min. Not able to discuss with anyone at [facility] to speak with Leveda Anna, pt's care partner.  Called Lance Morin [facility] [employer]) [phone] . Phone number is not working.    Margit Hanks, RN 10/14/22 11:44 AM

## 2022-10-17 ENCOUNTER — Ambulatory Visit: Payer: No Typology Code available for payment source | Admitting: Physician Assistant

## 2022-10-17 DIAGNOSIS — J449 Chronic obstructive pulmonary disease, unspecified: Secondary | ICD-10-CM | POA: Diagnosis not present

## 2022-10-17 DIAGNOSIS — I5033 Acute on chronic diastolic (congestive) heart failure: Secondary | ICD-10-CM

## 2022-10-17 DIAGNOSIS — Z5941 Food insecurity: Secondary | ICD-10-CM | POA: Diagnosis not present

## 2022-10-17 DIAGNOSIS — I5030 Unspecified diastolic (congestive) heart failure: Secondary | ICD-10-CM

## 2022-10-17 DIAGNOSIS — F251 Schizoaffective disorder, depressive type: Secondary | ICD-10-CM

## 2022-10-17 DIAGNOSIS — G40909 Epilepsy, unspecified, not intractable, without status epilepticus: Secondary | ICD-10-CM | POA: Diagnosis not present

## 2022-10-17 DIAGNOSIS — F1120 Opioid dependence, uncomplicated: Secondary | ICD-10-CM

## 2022-10-17 DIAGNOSIS — Z993 Dependence on wheelchair: Secondary | ICD-10-CM | POA: Diagnosis not present

## 2022-10-17 NOTE — Progress Notes (Signed)
Cheryl Zimmerman is a 52 year old female that presents today for: Establish care I    S:    1.)  Patient is a former patient of concern from several years ago.  Reestablish care in 05/2022, has not been seen in person due to rehab admission and trouble with transportation.    Patient has now been discharged from rehab after being there for several months.  She is at home, was discharged last week to a new apartment without any follow-up care.  No VNA services currently.  Patient is wheelchair-bound.  Patient is in need of services, needs help with medication management, help with ADLs and care around the home, laundry services, and meals.  Patient had noted to RN that she currently has 23 medications.      Patient active medical problems include:  -PTSD  -Schizoaffective disorder bipolar type  -History of SUD (etoh, cocaine)  -CHF  -Morbid obesity  -Seizure disorder  -Hypothyroidism  -COPD on oxygen  -Spinal stenosis  -GERD    Patient has had several ED visits and had a psych admissions-  to [facility]     - 09/2022 ED visit for muscle spasms, patient with chronic back pain.  Has Valium for intermittent muscle spasms.  Had a MRI 08/09/2022 for her chronic back pain and had noted degenerative changes.  Was treated with Valium in the hospital.  Patient noted improvement and discharged.    - 08/2022 ED visit for SOB, workup was overall stable, patient on 2 L of oxygen at night was given home dose of Bumetanide   1 mg and urinated and eventually felt better    - 06/2022-admitted to hospital from rehab for reported cutting herself with a razor.  5-day admission per do not psychiatric episode resolved after 1 day.  Patient did not remember what had brought her into the hospital.  The medication changes were made.  Stable on discharge    - 03/2022-ADMITTED in for suicidal ideation, patient triggered, at that admission patient had noted that her son had been murdered in front of her about a year and a half ago from  that time and patient was feeling SI/HI but had also seeking help, wanting to detox.              discontinuing quetiapine (which the psychiatric NP at her facility agreed with), increasing buspirone from 46m to 256mTID for anxiety, and initiating mirtazapine 7.69m34mightly to help with sleep     Review of Systems   Constitutional: Negative.   Cardiovascular: Negative.    Respiratory: Negative.     Psychiatric/Behavioral:  Positive for depression. The patient is nervous/anxious.          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     (HFpEF) heart failure with preserved ejection fraction (  [place])     MDD (major depressive disorder), recurrent, severe, with psychosis ([place])     Requires continuous at home supplemental oxygen     Spinal stenosis of lumbar 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 claudication     Fall in home     Gait instability     Hypokalemia     Lung nodule     Stable angina     Chest pain, atypical     Obesity hypoventilation syndrome ([name])     Osteoarthritis of spine with radiculopathy, lumbar region     Chronic bilateral low back pain with bilateral sciatica        Current Outpatient Medications:     DULoxetine HCl 40 MG CPEP, Take 40 mg by mouth in the morning., Disp: 30 capsule, Rfl: 2    prazosin (MINIPRESS) 1 MG capsule, Take 1 capsule by mouth nightly, Disp: 30 capsule, Rfl: 0    mirtazapine (REMERON) 7.5 MG tablet, Take 1 tablet by mouth nightly, Disp: 30 tablet, Rfl: 0    baclofen (LIORESAL) 10 MG tablet,  Take 10 mg by mouth in the morning and 10 mg before bedtime., Disp: , Rfl:     Budeson-Glycopyrrol-Formoterol 160-9-4.8 MCG/ACT AERO, Inhale 2 puffs into the lungs in the morning and 2 puffs before bedtime., Disp: , Rfl:     bumetanide (BUMEX) 1 MG tablet, Take 3 mg by mouth in the morning and 3 mg before bedtime., Disp: , Rfl:     buprenorphine-naloxone (SUBOXONE) 8-2 MG sublingual tablet, Place 1 tablet under the tongue in the morning and 1 tablet at noon and 1 tablet before bedtime., Disp: , Rfl:     fluticasone (FLONASE) 50 MCG/ACT nasal spray, 2 sprays by Each Nostril route in the morning and 2 sprays before bedtime., Disp: , Rfl:     FLUoxetine (PROZAC) 40 MG capsule, Take 40 mg by mouth in the morning., Disp: , Rfl:     hydrOXYzine (ATARAX) 25 MG tablet, Take 25 mg by mouth every 6 (six) hours as needed for Anxiety, Disp: , Rfl:     Melatonin 5 MG TABS Tablet, Take 5 mg by mouth nightly, Disp: , Rfl:     methocarbamol (ROBAXIN) 500 MG tablet, Take 500 mg by mouth in the morning and 500 mg at noon and 500 mg before bedtime., Disp: , Rfl:     Multiple Vitamin (MULTIVITAMIN) TABS, Take 1 tablet by mouth in the morning., Disp: , Rfl:     pantoprazole (PROTONIX) 40 MG tablet, Take 40 mg by mouth in the morning., Disp: , Rfl:     polyethylene glycol (GLYCOLAX/MIRALAX) 17 g packet, Take 17 g by mouth in the morning., Disp: , Rfl:     Potassium Chloride ER 20 MEQ TBCR, Take 20 mEq by mouth in the morning., Disp: , Rfl:     sennosides (SENOKOT) 8.6 MG tablet, Take 17.2 mg by mouth at bedtime, Disp: , Rfl:     thiamine (VITAMIN B-1) 100 mg tablet, Take 100 mg by mouth in the morning., Disp: , Rfl:     tiotropium (SPIRIVA) 18 MCG inhalation capsule, Inhale 18 mcg into the lungs in the morning., Disp: , Rfl:     OTHER MEDICATION, 1 each by Other route in the morning. Wheelchair: Bariatric motorized wheel chare (height 5'9", weight 328 lb, bmi 48.4) For use as directed.  Please fax to [facility] at [phone] DX: Spinal  stenosis of lumbar 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 claudication, Gait instability, Morbid obesity ([place]) ICD10:M48.061, R26.81,  E66.01., Disp: 1 each, Rfl: 0    atorvastatin (LIPITOR) 80 MG tablet, Take 1 tablet by mouth in the morning., Disp: 30 tablet, Rfl: 0    gabapentin (NEURONTIN) 300 MG capsule, Take 2 capsules by mouth in the morning and 2 capsules at noon and 2 capsules before bedtime., Disp: 180 capsule, Rfl: 0    levETIRAcetam (KEPPRA) 750 MG tablet, Take 1 tablet by mouth in the morning and 1 tablet before bedtime., Disp: 60 tablet, Rfl: 0    levothyroxine (SYNTHROID) 25 MCG tablet, Take 1 tablet by mouth every morning before breakfast, Disp: 30 tablet, Rfl: 0    OXcarbazepine (TRILEPTAL) 150 MG tablet, Take 1 tablet by mouth in the morning and 1 tablet before bedtime., Disp: 60 tablet, Rfl: 0    spironolactone (ALDACTONE) 100 MG tablet, Take 1 tablet by mouth in the morning., Disp: 30 tablet, Rfl: 0    tiotropium (SPIRIVA RESPIMAT) 2.5 MCG/ACT aerosol solution, Inhale 2 puffs into the lungs in the morning., Disp: 1 each, Rfl: 0    torsemide (DEMADEX) 100 MG tablet, Take 1 tablet by mouth in the morning., Disp: 30 tablet, Rfl: 0    traZODone (DESYREL) 50 MG tablet, Take 1 tablet by mouth nightly as needed for Sleep, Disp: 30 tablet, Rfl: 0    albuterol HFA 108 (90 Base) MCG/ACT inhaler, Inhale 2 puffs into the lungs every 6 (six) hours as needed for Wheezing, Disp: 1 each, Rfl: 0    Review of Patient's Allergies indicates:   Bee venom               Anaphylaxis   Metolazone              Other (See Comments)    Comment:Hypokalemia   Methylprednisolone      Dizziness, Drowsiness    Comment:Also believes syncope. Seems to tolerate             inhalational and epidural steroid.   Nutritional supplem*       Hydroxyzine             Nausea Only    O:    Most Recent BP Reading(s)  10/03/22 : 134/98  08/22/22 : 131/83  07/20/22 : 109/67  07/15/22 : 111/70  04/12/22 : 122/81          Most Recent Weight  Reading(s)  10/03/22 : (!) 158.8 kg (350 lb)  08/22/22 : (!) 156.5 kg (345 lb)  07/16/22 : (!) 155.5 kg (342 lb 12.8 oz)  07/15/22 : (!) 149 kg (328 lb 7.8 oz)  04/09/22 : (!) 149 kg (328 lb 6.4 oz)                    Physical Exam  Patient speech is normal and able to speak in clear sentences.        A/P.    (F25.1) Schizoaffective disorder, depressive type ([place])  (primary encounter diagnosis)  Comment: Patient with extensive psych history with SI, and recent several month rehab admission.  Recently discharged.  Unfortunately patient presented to an empty apartment with no furniture, or support services in place.  Will be reaching out to [facility] to help patient establish services.  Patient will be scheduled to be seen in person in the clinic.  RN will reach out to PCA for transportation support.  Patient has medications but needs help with management.  This is a high risk patient, and is very important that VNA, with medication management, support  with PCA for ADLs are put in place.  Patient will need follow-up with behavioral health as well.  Plan: Referral placed for VNA, will reach out to [facility] to coordinate care      (J44.9) Chronic obstructive pulmonary disease, unspecified COPD type ([place])  Comment: Stable with inhalers  Plan: Continue with medications    (I50.33) Acute on chronic diastolic (congestive) heart failure ([name])  ([id number]) Diastolic congestive heart failure, unspecified HF chronicity ([place])  Comment: History of CHF, on medication regiment.  Does need follow-up for further evaluation and monitoring.  Plan: Continue with medications, follow-up in person visit.    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  On Suboxone per review of med list.  Plan: Continue with medications as directed.      (G40.909) Nonintractable epilepsy without status epilepticus, unspecified epilepsy type ([place])  Comment: Stable at this time.  Taking medications.  Plan: Continue with medications as  directed.      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  Did try to empty home without any furniture or support services in place.  Patient needs further support for food, medication management and help completing ADLs.  Patient is wheelchair-bound.  Plan: Referral to [facility]          We discussed the patient's medical issues and the importance of medication compliance. The patient was ready to learn and no apparent learning barriers were identified. I explained the diagnosis and treatment plan, and the patient expressed understanding of the content. Possible side effects of the prescribed medication(s) were explained. I attempted to answer any questions regarding the diagnosis and the proposed treatment.    This note was prepared using voice recognition software. Please disregard any transcription errors.         Margot Ables, PA-C [employer]als on wheel s- ;launsdery service   [place] to help  celan at [employer]

## 2022-10-21 ENCOUNTER — Telehealth (HOSPITAL_BASED_OUTPATIENT_CLINIC_OR_DEPARTMENT_OTHER): Payer: Self-pay | Admitting: Registered Nurse

## 2022-10-21 NOTE — Telephone Encounter (Signed)
Called [facility] [phone]  On hold for more than 5+ minutes. Unable to reach anyone.    Margit Hanks, RN 10/21/22 4:22 PM

## 2022-10-21 NOTE — Telephone Encounter (Addendum)
Called [facility] [phone]   Spoke to [place] working on getting transportation.   Advised to call back at 4-5p later today for update.    Margit Hanks, RN 10/21/22 8:43 AM        ----- Message from Margit Hanks, RN sent at 10/14/2022  5:20 PM EST -----  Regarding: CAll for [facility] transportation  Call [facility] transportation on Friday 10/21/22. Pt has in person appt with Margot Ables on 10/25/22. Need to call [facility] 3 days before appt date to confirm ride.

## 2022-10-21 NOTE — Telephone Encounter (Signed)
Called [facility] [phone]    Follow up regarding transportation    Coordination Transportation Solution   Spoke to Joelene Millin'  They have not found her a ride. Advised to call next Monday.    Margit Hanks, RN 10/21/22 4:36 PM

## 2022-10-21 NOTE — Telephone Encounter (Signed)
Called Leveda Anna Teacher, music.) [phone]   No answer. Left message on machine for callback to [facility].  Regarding VNA service as she is listed as her [facility] care partner.    Margit Hanks, RN 10/21/22 4:00 PM

## 2022-10-24 NOTE — Telephone Encounter (Signed)
Called [facility] [phone]    Follow up regarding transportation    Spoke to [place], who is unable to find ride for pt tomorrow.  She has no mentioned this RN has called two times already to confirm ride for pt.  Cecille Aver inquiring if pt has manual wheelchair or electric wheelchair and how much pt and her wheelchair weigh.  This RN does not have this information. Advised pt's [facility] care partner Leveda Anna is suppose to coordinate transportation. [facility] transportation is not able to schedule ride at this time.  Advised to speak with supervisor and transferred call. Put on hold for more than 5+ min. Unable to reach supervisor.    Margit Hanks, RN 10/24/22 10:56 AM      Called [facility] [phone].   To speak to manager/ supervisor to [facility], pressed #2 and pressed #4. Pt has external Care Partner. Jodi's manager is   Earney Mallet [phone]. No answer. Left message on machine for callback to [facility] clinic.    Margit Hanks, RN 10/24/22 11:17 AM      Called [facility] of [facility] [phone]   Do not take patient living in [place], [place]  Fax: [phone]  Attn Intake Dept.     Printed [place] AMPL, insurance, med list and VNA referral.  Need last televisit on 10/17/22 from [place] completed then can fax over.     Margit Hanks, RN 10/24/22 11:42 AM

## 2022-10-25 ENCOUNTER — Telehealth (HOSPITAL_BASED_OUTPATIENT_CLINIC_OR_DEPARTMENT_OTHER): Payer: Self-pay

## 2022-10-25 ENCOUNTER — Ambulatory Visit (HOSPITAL_BASED_OUTPATIENT_CLINIC_OR_DEPARTMENT_OTHER): Payer: No Typology Code available for payment source | Admitting: Physician Assistant

## 2022-10-25 NOTE — Telephone Encounter (Signed)
Regarding: VNA Services Unable to Accommodate Patient at This Time    Name of [place]  Name of person calling Debby    Message: Wanted to inform provider and nurses that they are unable to accommodate patient at this time. They recommended to send the patient to [facility] [phone]) since they do provide psych services.     Return phone number: did not leave behind phone number         Message received  Nurse Clydie Braun has tried many VNAs [facility], [facility], [facility] and [facility]), all declined patient  Provider Ashok Cordia is aware  Reached out to pt's care partner at [facility]  However unable to reach  Left message on VM requesting call back clinic  Clinic number recited x2  Awaiting call back      Arvilla Meres, RN, 10/25/2022

## 2022-10-25 NOTE — Telephone Encounter (Signed)
-----   Message from [place] sent at 10/25/2022 10:16 AM EST -----  Regarding: VNA Services Unable to Accommodate Patient at This Time  Cheryl Zimmerman [id number], 52 year old, female    Calls today: VNA/Nursing Home/Other Agency: non urgent call    Name of [place]  Name of person calling Debby  Reason for call Wanted to inform provider and nurses that they are unable to accommodate patient at this time. They recommended to send the patient to [facility] [phone]) since they do provide psych services.   Return phone number n/a, did not leave behind phone number    NOTE: Urgent calls are a warm handoff        Person calling on behalf of patient: Debby    CALL BACK NUMBER: n/a  Best time to call back: n/a  Cell phone:   Other phone:    Patient's language of care: English    Patient does not need an interpreter.    Patient's PCP: Cora Collum, MD    Primary Care Home Site:  [facility]

## 2022-10-25 NOTE — Telephone Encounter (Signed)
Regarding: [facility] Care Requesting to Speak with RN Regarding Mutual Patient   Calls today: [place]/[facility]   Name of person calling Jovi, Care Partner   Message: Requesting to speak with a nurse regarding mutual patient    [place] NUMBER: [phone]      Message received and call returned to [place]  Pt's identity confirmed  Jovi states that she had received a call from nurse Tenzin and wanted to know how she could help.  Informed Jovi that nurse Clydie Braun was trying to schedule transportation for patient to come see PCP for a hospital follow up  Jovi was made aware that upcoming appt is on 11/10/22  Jovi voices understanding, states she will get pt a ride  No further questions or concerns at this time  RN left patient reminder on VM about upcoming appt with PCP for a follow up and [facility] to provide transportation  FYI sent to provider Wilber Bihari, RN, 10/25/2022

## 2022-10-25 NOTE — Telephone Encounter (Signed)
-----   Message from [place] sent at 10/25/2022  9:08 AM EST -----  Regarding: [facility] Care Requesting to Speak with RN Regarding Mutual Patient  Cheryl Zimmerman [id number], 52 year old, female    Calls today: [place]/[facility]   Name of person calling Jovi, Care Partner   Reason for call Requesting to speak with a nurse regarding mutual patient  Return phone number [phone]    NOTE: Urgent calls are a warm handoff        Person calling on behalf of patient:     Rod Can NUMBER: [phone]  Best time to call back: Anytime   Cell phone:   Other phone:    Patient's language of care: English    Patient does not need an interpreter.    Patient's PCP: Cora Collum, MD    Primary Care Home Site:  [facility]

## 2022-10-27 ENCOUNTER — Encounter (HOSPITAL_BASED_OUTPATIENT_CLINIC_OR_DEPARTMENT_OTHER): Payer: Self-pay

## 2022-11-04 ENCOUNTER — Telehealth (HOSPITAL_BASED_OUTPATIENT_CLINIC_OR_DEPARTMENT_OTHER): Payer: Self-pay | Admitting: Community Health Worker

## 2022-11-04 NOTE — Telephone Encounter (Signed)
-----   Message from [facility] sent at 10/31/2022  8:32 AM EST -----  Patient with recent long                admission to rehab has been recently discharged.  She is in need                of help and support for resources, particularly food.  As well as                P[facility] to help her   home.

## 2022-11-04 NOTE — Telephone Encounter (Signed)
Care Plan: Patient Resource Coordination [facility])        Problem: Cheryl Zimmerman PATIENT RESOURCE COORDINATION         Goal: [facility] Outreach         Task: 1st Outreach attempt Completed 11/04/2022   Responsible User: Romie Jumper, Ivie Maese   Note:    Late entry for this encounter. It has been a couple days since the writer is trying to get a hold of Case Manager from [place] through [place] - Her name is Cheryl Zimmerman. The writer did a thorough chart review and there has been many numbers for [employer]  Vm left for a call back.   Call [facility] again and ask to speak with a Freight forwarder. That was not possible at the moment. Message from Cheryl Zimmerman sent to [facility] to contact the patient to address her needs.  Numbers given for Cheryl Zimmerman  [phone]  and [phone].    The writer is covering [employer] for a few days while she is out of the office.  Out of scope referral as the patient should have services at her disposal.        Unfortunately not able to care coordinate. Will wait for a call back hopefully to better assist this patient

## 2022-11-09 NOTE — Telephone Encounter (Signed)
RN was able to reach pt's [place] at [phone]  Pt's identity confirmed  RN informed Cheryl Zimmerman that clinic has been unable to find VNA for pt and reaching to her for further assistance  Cheryl Zimmerman states that she had received a call from a nurse in [place] who went by and checked on pt and advised Jody of condition pt is living in  Per Jody, [facility] is aware of everything and also having difficulty finding a vendor/VNA for pt  Cheryl Zimmerman will have a discussion meeting with her supervisor to figure out how to assist then call clinic with updates  Clinic number provided: [phone]  FYI sent to [place], RN, 11/09/2022

## 2022-11-10 ENCOUNTER — Ambulatory Visit (HOSPITAL_BASED_OUTPATIENT_CLINIC_OR_DEPARTMENT_OTHER): Payer: No Typology Code available for payment source

## 2022-11-22 ENCOUNTER — Telehealth (HOSPITAL_BASED_OUTPATIENT_CLINIC_OR_DEPARTMENT_OTHER): Payer: Self-pay

## 2022-11-22 NOTE — Telephone Encounter (Signed)
Regarding: Relaying Message to Provider Regarding Medication     Message: Patient wanted to inform Dr. Mady Haagensen that they will be [facility] and they will no longer prescribe oxycodone to the patient. She states that when she last saw Dr. Mady Haagensen at the hospital they mentioned that they were going to discontinue suboxone and going to start prescribing oxycodone '5mg'$  instead. The patient hasn't been able to see the provider recently and states that they must have forgot. Patient states the pain continues to bother them.       [place] NUMBER: [phone]      Message received and call returned to patient  Identity confirmed  Pt states that she still is in [facility], thinks she will be discharged possibly end of this week  Hospital is working on finding her VNA services prior discharge  States Suboxone no longer working for her and now pt is put on PRN Oxycodone per pt  Asks if PCP can send her a prescription for more Oxycodone  Informed pt that if pain management is not effective to let the team that is caring for her at the Hospital know, so they can work on a better pain management as the PCP will not able to send a prescription for more Oxycodone while pt being care for at the [place] to call back clinic when knows date of discharge to schedule a hospital follow up  Pt voices good understanding and agrees with this dispo  Has no further questions or concerns at this time      Arvilla Meres, RN, 11/22/2022

## 2022-11-22 NOTE — Telephone Encounter (Signed)
-----   Message from [facility] sent at 11/21/2022  3:02 PM EST -----  Regarding: Relaying Message to Provider Regarding Medication  Cheryl Zimmerman [id number], 52 year old, female    Calls today: Patient wanted to inform Dr. Mady Haagensen that they will be [facility] and they will no longer prescribe oxycodone to the patient. She states that when she last saw Dr. Mady Haagensen at the hospital they mentioned that they were going to discontinue suboxone and going to start prescribing oxycodone '5mg'$  instead. The patient hasn't been able to see the provider recently and states that they must have forgot. Patient states the pain continues to bother them.     Person calling on behalf of patient: Patient (self)    CALL BACK NUMBER: [phone]  Best time to call back: Anytime   Cell phone:   Other phone:    Patient's language of care: English    Patient does not need an interpreter.    Patient's PCP: Cora Collum, MD    Primary Care Home Site:  [facility]

## 2022-11-24 ENCOUNTER — Telehealth (HOSPITAL_BASED_OUTPATIENT_CLINIC_OR_DEPARTMENT_OTHER): Payer: Self-pay

## 2022-11-24 NOTE — Telephone Encounter (Signed)
Regarding: [place]     Message: Vaughan Basta from [facility] [phone], would like to talk to Nurse about Patient. And the Medication           Message received and call returned to [facility] who confirms pt's full name and DOB  Vaughan Basta introduces self as new VNA nurse from [facility] working with pt, states she is actually with pt and requests to review discharge med list from [place] discrepancies noticed, discharge list does not match with current med list on [place] a visit for tomorrow 3/8 for a Hospital follow up  Vaughan Basta states that they need 3 business days to arrange transportation for pt therefore not able to accept the visit for tomorrow  In-person is scheduled in a week 3/14 with provider PA [place] to continue with the discharge med list, and provider to review and adjust pt's med list on Epic  Also advised to send pt with the hospital discharge med list and all medications  Nurse Vaughan Basta to arrange transportation  No further questions or concerns at this time      Arvilla Meres, RN, 11/24/2022

## 2022-11-24 NOTE — Telephone Encounter (Signed)
-----   Message from [facility] sent at 11/24/2022 12:19 PM EST -----  Regarding: [place] [id number], 52 year old, female    Calls today: Vaughan Basta from [facility] [phone], would like to talk to Nurse about Patient. And the Medication    Person calling on behalf of patient: [place]: Anytime  Best time to call back:   Cell phone:   Other phone:    Patient's language of care: English    Patient does not need an interpreter.    Patient's PCP: Cora Collum, MD    Primary Care Home Site:  [facility]

## 2022-11-24 NOTE — Telephone Encounter (Signed)
-----   Message from Shekhar Maity sent at 11/24/2022 12:19 PM EST -----  Regarding: [facility]  Cheryl Zimmerman [id number], 51 year old, female    Calls today: [name] from [facility] [phone], would like to talk to Nurse about Patient. And the Medication    Person calling on behalf of patient: [facility]    CALL BACK NUMBER: Anytime  Best time to call back:   Cell phone:   Other phone:    Patient's language of care: English    Patient does not need an interpreter.    Patient's PCP: [name], Ian, MD    Primary Care Home Site:  [facility]

## 2022-12-01 ENCOUNTER — Ambulatory Visit: Payer: No Typology Code available for payment source | Attending: Family Medicine | Admitting: Family Medicine

## 2022-12-01 ENCOUNTER — Other Ambulatory Visit: Payer: Self-pay

## 2022-12-01 ENCOUNTER — Telehealth (HOSPITAL_BASED_OUTPATIENT_CLINIC_OR_DEPARTMENT_OTHER): Payer: Self-pay

## 2022-12-01 VITALS — BP 117/82 | HR 98

## 2022-12-01 DIAGNOSIS — G894 Chronic pain syndrome: Secondary | ICD-10-CM | POA: Insufficient documentation

## 2022-12-01 DIAGNOSIS — F251 Schizoaffective disorder, depressive type: Secondary | ICD-10-CM | POA: Diagnosis present

## 2022-12-01 DIAGNOSIS — Z79899 Other long term (current) drug therapy: Secondary | ICD-10-CM | POA: Insufficient documentation

## 2022-12-01 DIAGNOSIS — I5020 Unspecified systolic (congestive) heart failure: Secondary | ICD-10-CM | POA: Insufficient documentation

## 2022-12-01 DIAGNOSIS — E876 Hypokalemia: Secondary | ICD-10-CM | POA: Insufficient documentation

## 2022-12-01 DIAGNOSIS — R45851 Suicidal ideations: Secondary | ICD-10-CM | POA: Insufficient documentation

## 2022-12-01 DIAGNOSIS — F1120 Opioid dependence, uncomplicated: Secondary | ICD-10-CM

## 2022-12-01 DIAGNOSIS — J449 Chronic obstructive pulmonary disease, unspecified: Secondary | ICD-10-CM | POA: Diagnosis present

## 2022-12-01 DIAGNOSIS — Z720 Tobacco use: Secondary | ICD-10-CM | POA: Diagnosis present

## 2022-12-01 MED ORDER — NICOTINE 21 MG/24HR TD PT24
1.0000 | MEDICATED_PATCH | TRANSDERMAL | 0 refills | Status: DC
Start: 2022-12-01 — End: 2023-06-20

## 2022-12-01 MED ORDER — ONE-DAILY MULTI VITAMINS PO TABS
1.0000 | ORAL_TABLET | Freq: Every day | ORAL | 3 refills | Status: DC
Start: 2022-12-01 — End: 2023-10-25

## 2022-12-01 MED ORDER — POTASSIUM CHLORIDE ER 20 MEQ PO TBCR
20.00 meq | EXTENDED_RELEASE_TABLET | Freq: Every day | ORAL | 0 refills | Status: AC
Start: 2022-12-01 — End: 2023-03-01

## 2022-12-01 MED ORDER — NICOTINE 14 MG/24HR TD PT24
1.00 | MEDICATED_PATCH | TRANSDERMAL | 0 refills | Status: AC
Start: 2023-01-13 — End: 2023-01-27

## 2022-12-01 MED ORDER — POLYETHYLENE GLYCOL 3350 17 G PO PACK
17.0000 g | PACK | Freq: Every day | ORAL | 3 refills | Status: DC
Start: 2022-12-01 — End: 2023-10-25

## 2022-12-01 MED ORDER — NICOTINE 7 MG/24HR TD PT24
1.00 | MEDICATED_PATCH | TRANSDERMAL | 0 refills | Status: AC
Start: 2023-01-28 — End: 2023-02-11

## 2022-12-01 MED ORDER — NICOTINE POLACRILEX 4 MG MT GUM
4.0000 mg | CHEWING_GUM | OROMUCOSAL | 11 refills | Status: DC | PRN
Start: 2022-12-01 — End: 2023-10-25

## 2022-12-01 NOTE — Telephone Encounter (Signed)
[name], Cheryl Pacer, MD; [facility] Rn Pool  [place],  [place] all is well. Pt has chronic pain currently on Suboxone wondering about adding oxycodone 5 mg prescription every night - she reports this was started at [facility] with good relief. We have no records and will ask RN to reach out to confirm. I told her I defer to you and we will await your response.    RN,  Please call [facility] and inquire for records from recent hospitalization.    Thanks!  [name]        RN unable to call [facility] as they are currently closed.   Medical records hours of operation are M-F 8am-5am. Currently 5:41pm.       Cheryl Zimmerman
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
## 2022-12-01 NOTE — Telephone Encounter (Signed)
[name], Gaye Alken, PA-C  [facility] Front Desk Pool  Please schedule pt for in person appt w PCP in 1 mo thanks!    Planned Care Outreach  Call made to Cheryl Zimmerman to schedule an appointment  follow up . No interpreter needed.   Patient (self) did not answer at  ; this team member left a message asking for a call back.  I have completed the following communication and reminder steps: Patient notified by telephone  Judithann Graves, 12/01/2022    On behalf of PCP: Cora Collum, MD

## 2022-12-02 ENCOUNTER — Other Ambulatory Visit (HOSPITAL_BASED_OUTPATIENT_CLINIC_OR_DEPARTMENT_OTHER): Payer: Self-pay | Admitting: Family Medicine

## 2022-12-02 DIAGNOSIS — E876 Hypokalemia: Secondary | ICD-10-CM

## 2022-12-02 NOTE — Telephone Encounter (Signed)
RN called [facility] medical records dept.   Voice message instructs to fax medical requests to [phone].   RN printed medical records request and faxed to hospital.   Fax confirmation received.       [name] [facility]

## 2022-12-06 ENCOUNTER — Other Ambulatory Visit (HOSPITAL_BASED_OUTPATIENT_CLINIC_OR_DEPARTMENT_OTHER): Payer: Self-pay

## 2022-12-06 NOTE — Telephone Encounter (Signed)
Per patients nurse  Marengo Iredell  [phone]    Patient needs all the following medications.      - Please review, medications have been discontinued/marked Historical.     -METHOCARBAMOL 1.5 TABLETS 3 TIMES DAILY  -LEVOTHYROXINE- 1TAB A DAY   -BUMETANIDE- 4MG  DAILY    -ATORVASTATIN- 1 DAILY   - GABAPENTIN 600MG  - 1 TAB TWICE DAILY   - SPIRONOLACTONE 100MG - 1 TBA IN THE MORNING. Please review med has been D/C in med list  - LEVETIRACETAM- 750MG   TWICE DAILY  - OXCARBAZEPINE-150MG  1 TAB TWICE DAILY   - SUBOXONE- 3 TIMES DAILY     Please review and advise as appropriate. Please note the directions above were given by the patients nurse.     Thank You   Central Refill        Documented patient preferred pharmacies:      [id number] [address], [place]  Phone: [phone] Fax: [phone]

## 2022-12-07 NOTE — Telephone Encounter (Signed)
Routing to PCP for advice.    Franki Cabot, RN 12/07/22 2:05 PM

## 2022-12-08 ENCOUNTER — Other Ambulatory Visit (HOSPITAL_BASED_OUTPATIENT_CLINIC_OR_DEPARTMENT_OTHER): Payer: Self-pay | Admitting: Family Medicine

## 2022-12-08 ENCOUNTER — Telehealth (HOSPITAL_BASED_OUTPATIENT_CLINIC_OR_DEPARTMENT_OTHER): Payer: Self-pay
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
## 2022-12-09 MED ORDER — ATORVASTATIN CALCIUM 80 MG PO TABS
80.0000 mg | ORAL_TABLET | Freq: Every day | ORAL | 0 refills | Status: DC
Start: 2022-12-09 — End: 2022-12-25

## 2022-12-09 MED ORDER — METHOCARBAMOL 500 MG PO TABS
750.00 mg | ORAL_TABLET | Freq: Three times a day (TID) | ORAL | 0 refills | Status: AC
Start: 2022-12-09 — End: 2023-01-08

## 2022-12-09 MED ORDER — BUMETANIDE 1 MG PO TABS
4.0000 mg | ORAL_TABLET | Freq: Every day | ORAL | 0 refills | Status: DC
Start: 2022-12-09 — End: 2022-12-31

## 2022-12-09 MED ORDER — GABAPENTIN 600 MG PO TABS
600.0000 mg | ORAL_TABLET | Freq: Two times a day (BID) | ORAL | 0 refills | Status: DC
Start: 2022-12-09 — End: 2023-06-02

## 2022-12-09 MED ORDER — LEVOTHYROXINE SODIUM 25 MCG PO TABS
25.0000 ug | ORAL_TABLET | Freq: Every morning | ORAL | 0 refills | Status: DC
Start: 2022-12-09 — End: 2022-12-25

## 2022-12-09 MED ORDER — OXCARBAZEPINE 150 MG PO TABS
150.0000 mg | ORAL_TABLET | Freq: Two times a day (BID) | ORAL | 0 refills | Status: DC
Start: 2022-12-09 — End: 2023-03-13

## 2022-12-09 MED ORDER — LEVETIRACETAM 750 MG PO TABS
750.0000 mg | ORAL_TABLET | Freq: Two times a day (BID) | ORAL | 0 refills | Status: DC
Start: 2022-12-09 — End: 2022-12-25

## 2022-12-09 MED ORDER — SPIRONOLACTONE 100 MG PO TABS
100.0000 mg | ORAL_TABLET | Freq: Every day | ORAL | 0 refills | Status: DC
Start: 2022-12-09 — End: 2022-12-11

## 2022-12-09 MED ORDER — BUPRENORPHINE HCL-NALOXONE HCL 8-2 MG SL SUBL
1.0000 | SUBLINGUAL_TABLET | Freq: Three times a day (TID) | SUBLINGUAL | 0 refills | Status: DC
Start: 2022-12-09 — End: 2022-12-14

## 2022-12-09 NOTE — Telephone Encounter (Deleted)
Cheryl Zimmerman blamed nurse for provider not filling   [name] said she doesn't want linda at her house     Called pt case manager at [facility] to mental health case managerr chelsea   Several phone calls from pt screaming F You     Believes pt should be sectioned   Pt has meds until Sunday pm in med planner.     Had last dose yesterday . \
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
## 2022-12-09 NOTE — Telephone Encounter (Signed)
RN called VNA Nurse Vaughan Basta.   Confirmed pt name and DOB.  Vaughan Basta states that she will no longer be seeing pt for visits.   Pt was accusing nurse Vaughan Basta of informing providers that there was a discrepancy with pts subxone medications.   Per Vaughan Basta, pt kept calling her and leaving voice messages with profanity and refused to have nurse Vaughan Basta in her home for visits.   Pt refused to have any other nurses from [place] in her home.   Nurse Vaughan Basta states that pt took her last dose of suboxone yesterday 12/08/22.   [name] filled pts med planner with remaining medications to last pt through Sunday afternoon (3/24).   Nurse Vaughan Basta provided lock box code to share with other VNA company who will take over pt care.       LOCK BOX CODE : 0000        Cheryl Longest, RN, 12/09/2022

## 2022-12-09 NOTE — Telephone Encounter (Signed)
RN called pt.  Confirmed pt name and DOB.   Pt states that she has been out of suboxone and other meds since Monday and is in excruciating pain.   Has been attempting for several days to get medication filled.   Pt reports "my VNA nurse quit because she hasn't been able to get in contact with anyone from the clinic when she calls."   Pt requesting that medications be sent to the following pharmacy:       [id number] [address], [place] [phone]       High priority message sent to providers.     [name], RN, 12/09/2022

## 2022-12-09 NOTE — Telephone Encounter (Signed)
Have not received return phone call from Dr. Mady Haagensen at this time.   Spoke to [employer].   Provider states that she will prescribe 6 tabs suboxone and other medications.   Pt has TV with PCP on 12/15/22 and to follow up regarding medications at that time.     RN called pt.   Confirmed name and DOB.   Informed pt that provider will give partial refill until TV with provider on 12/15/22.   Advised pt that [facility] will call her.   Informed pt that it would be best to have a VNA nurse assist with meds.   Pt agreeable to having a VNA come to her home to help manage meds through the weekend.   Pt will await phone call from Director of [facility].      [name] [facility]

## 2022-12-09 NOTE — Telephone Encounter (Signed)
RN attempted to call pt however unable to reach at this time.  VML requesting pt return call to clinic.   Call back number recited x2.     RN attempted to call Director Lynn from [facility].   Unable to reach at this time.   VML with call back number to clinic and RN direct phone number provided.     Dontavian Marchi C Caree Wolpert BSN,RN

## 2022-12-09 NOTE — Telephone Encounter (Signed)
Page sent to Dr. Cora Collum at 11:29am.   Awaiting phone call at this time.     [name] [facility]

## 2022-12-09 NOTE — Telephone Encounter (Addendum)
Have not received return phone call from Dr. Mady Haagensen at this time.   Spoke to [employer].   Provider states that she will prescribe 6 tabs suboxone and other medications.   Pt has TV with PCP on 12/15/22 and to follow up regarding medications at that time.    RN called pt.   Confirmed name and DOB.   Informed pt that provider will give partial refill until TV with provider on 12/15/22.   Advised pt that [facility] will call her.   Informed pt that it would be best to have a VNA nurse assist with meds.   Pt states that she is capable of managing suboxone on her own as she has done this in the past however she is agreeable to having a VNA come to her home to help manage meds through the weekend.   Pt will await phone call from Director of [facility].     [name] [facility]

## 2022-12-09 NOTE — Telephone Encounter (Signed)
RN attempted to call pt however unable to reach at this time.  VML requesting pt return call to clinic.   Call back number recited x2.     RN attempted to call Director Jeani Hawking from [place].   Unable to reach at this time.   VML with call back number to clinic and RN direct phone number provided.     [name] [facility]

## 2022-12-09 NOTE — Telephone Encounter (Deleted)
Cheryl Zimmerman  P Cwin Rn [place] [id number], 52 year old, female    Calls today: Patient has been trying to get in contact with the PCP regarding their medications. They have been calling for the past three days and had one of their nurses from [facility] call as well, but has not heard back. Patient is reporting that they are in excruciating pain and needs to speak with a  nurse immediately. Please see encounters previously made by staff.    Person calling on behalf of patient: Patient (self)    CALL BACK NUMBER: [phone]  Best time to call back: Anytime  Cell phone:  Other phone:    Patient's language of care: English    Patient does not need an interpreter.    Patient's PCP: Cora Collum, MD    Primary Care Home Site:  [facility]

## 2022-12-09 NOTE — Telephone Encounter (Addendum)
RN paged Dr. Mady Haagensen at 11:29, awaiting provider to return call to clinic.   RN spoke with Joneen Boers PA-C in person at 11:44am.  Informed that per [facility] nurse,  pt is out of suboxone as of yesterday.   Informed provider that pt refused to have VNA nurse in home and will need referral for new VNA.   Joneen Boers PA-C concerned that pt may not be able to manage suboxone independently.  RN called [facility] and spoke to [employer].   Informed Jeani Hawking of what happened with pt firing [facility] nurse Vaughan Basta.  RN inquired if there was another nurse that can possibly see pt just through the weekend to help manage suboxone.   Today is Friday, concerned that TW will not be able to find a VNA who can enroll pt today and send a nurse to pt this weekend to help manage suboxone.   [place] director Jeani Hawking states that there she does not have any nurses available for where pt lives.   Jeani Hawking states that she will call pt to see if pt agreeable to nurse visits through the weekend.  Jeani Hawking to follow with clinic. Provided with direct phone number for this RN.     [name] [facility]

## 2022-12-09 NOTE — Telephone Encounter (Signed)
Cheryl Zimmerman  P Cwin Rn [place] [id number], 52 year old, female    Calls today: Patient has been trying to get in contact with the PCP regarding their medications. They have been calling for the past three days and had one of their nurses from [facility] call as well, but has not heard back. Patient is reporting that they are in excruciating pain and needs to speak with a  nurse immediately. Please see encounters previously made by staff.    Person calling on behalf of patient: Patient (self)    CALL BACK NUMBER: [phone]  Best time to call back: Anytime  Cell phone:  Other phone:    Patient's language of care: English    Patient does not need an interpreter.    Patient's PCP: Cora Collum, MD    Primary Care Home Site:  [facility]

## 2022-12-10 ENCOUNTER — Other Ambulatory Visit: Payer: Self-pay

## 2022-12-10 ENCOUNTER — Inpatient Hospital Stay
Admission: AD | Admit: 2022-12-10 | Discharge: 2022-12-14 | DRG: 885 | Disposition: A | Discharge status: Home / Self Care | Payer: No Typology Code available for payment source | Source: Intra-hospital | Attending: Psychiatry | Admitting: Psychiatry

## 2022-12-10 ENCOUNTER — Encounter (HOSPITAL_BASED_OUTPATIENT_CLINIC_OR_DEPARTMENT_OTHER): Payer: Self-pay

## 2022-12-10 ENCOUNTER — Emergency Department (EMERGENCY_DEPARTMENT_HOSPITAL)
Admission: EM | Admit: 2022-12-10 | Discharge: 2022-12-10 | Disposition: A | Discharge status: Other Acute Inpatient Hospital | Payer: No Typology Code available for payment source | Source: Emergency Department | Attending: Student in an Organized Health Care Education/Training Program | Admitting: Student in an Organized Health Care Education/Training Program

## 2022-12-10 DIAGNOSIS — I5032 Chronic diastolic (congestive) heart failure: Secondary | ICD-10-CM | POA: Diagnosis not present

## 2022-12-10 DIAGNOSIS — R079 Chest pain, unspecified: Secondary | ICD-10-CM

## 2022-12-10 DIAGNOSIS — F4310 Post-traumatic stress disorder, unspecified: Secondary | ICD-10-CM | POA: Diagnosis present

## 2022-12-10 DIAGNOSIS — F339 Major depressive disorder, recurrent, unspecified: Principal | ICD-10-CM | POA: Diagnosis present

## 2022-12-10 DIAGNOSIS — K5900 Constipation, unspecified: Secondary | ICD-10-CM | POA: Diagnosis not present

## 2022-12-10 DIAGNOSIS — J449 Chronic obstructive pulmonary disease, unspecified: Secondary | ICD-10-CM | POA: Diagnosis present

## 2022-12-10 DIAGNOSIS — F250 Schizoaffective disorder, bipolar type: Secondary | ICD-10-CM | POA: Diagnosis present

## 2022-12-10 DIAGNOSIS — R45851 Suicidal ideations: Secondary | ICD-10-CM | POA: Diagnosis present

## 2022-12-10 DIAGNOSIS — G4733 Obstructive sleep apnea (adult) (pediatric): Secondary | ICD-10-CM | POA: Diagnosis not present

## 2022-12-10 DIAGNOSIS — F32A Depression, unspecified: Principal | ICD-10-CM

## 2022-12-10 DIAGNOSIS — K219 Gastro-esophageal reflux disease without esophagitis: Secondary | ICD-10-CM | POA: Diagnosis present

## 2022-12-10 DIAGNOSIS — I509 Heart failure, unspecified: Secondary | ICD-10-CM | POA: Diagnosis present

## 2022-12-10 DIAGNOSIS — F251 Schizoaffective disorder, depressive type: Secondary | ICD-10-CM | POA: Diagnosis present

## 2022-12-10 DIAGNOSIS — F603 Borderline personality disorder: Secondary | ICD-10-CM | POA: Diagnosis present

## 2022-12-10 DIAGNOSIS — R9431 Abnormal electrocardiogram [ECG] [EKG]: Secondary | ICD-10-CM

## 2022-12-10 DIAGNOSIS — E039 Hypothyroidism, unspecified: Secondary | ICD-10-CM | POA: Diagnosis not present

## 2022-12-10 DIAGNOSIS — G40909 Epilepsy, unspecified, not intractable, without status epilepticus: Secondary | ICD-10-CM | POA: Diagnosis not present

## 2022-12-10 LAB — URINE DRUG SCREEN
AMPHETAMINES URINE: NEGATIVE ng/mL
BENZODIAZEPINES URINE: NEGATIVE ng/mL
BUPRENORPHINE SCREEN URINE: POSITIVE ng/mL — AB
CANNABINOIDS URINE: NEGATIVE ng/mL
COCAINE METABOLITES URINE: NEGATIVE ng/mL
ETHANOL URINE: NEGATIVE mg/dL
FENTANYL URINE: NEGATIVE ng/mL
METHADONE URINE: NEGATIVE ng/mL
OPIATES URINE: NEGATIVE ng/mL
OXYCOD SCRN URINE: NEGATIVE ng/mL
SPEC VALIDITY SPECIFIC GRAVITY: 1.01 (ref 1.003–1.035)
SPECIMEN VALIDITY URINE CREAT: 55 mg/dL (ref >20)
SPECIMEN VALIDITY URINE PH: 6.4 (ref 5.0–8.5)

## 2022-12-10 LAB — CBC, PLATELET & DIFFERENTIAL
ABSOLUTE BASO COUNT: 0 10*3/uL (ref 0.0–0.1)
ABSOLUTE EOSINOPHIL COUNT: 0.1 10*3/uL (ref 0.0–0.8)
ABSOLUTE IMM GRAN COUNT: 0.04 10*3/uL (ref 0.00–0.10)
ABSOLUTE LYMPH COUNT: 3.1 10*3/uL (ref 0.6–5.9)
ABSOLUTE MONO COUNT: 0.6 10*3/uL (ref 0.2–1.4)
ABSOLUTE NEUTROPHIL COUNT: 6.2 10*3/uL (ref 1.6–8.3)
ABSOLUTE NRBC COUNT: 0 10*3/uL (ref 0.0–0.0)
BASOPHIL %: 0.1 % (ref 0.0–1.2)
EOSINOPHIL %: 1.4 % (ref 0.0–7.0)
HEMATOCRIT: 46.7 % — ABNORMAL HIGH (ref 34.1–44.9)
HEMOGLOBIN: 15 g/dL (ref 11.2–15.7)
IMMATURE GRANULOCYTE %: 0.4 % (ref 0.0–1.0)
LYMPHOCYTE %: 30.3 % (ref 15.0–54.0)
MEAN CORP HGB CONC: 32.1 g/dL (ref 31.0–37.0)
MEAN CORPUSCULAR HGB: 29.7 pg (ref 26.0–34.0)
MEAN CORPUSCULAR VOL: 92.5 fl (ref 80.0–100.0)
MEAN PLATELET VOLUME: 11.2 fL (ref 8.7–12.5)
MONOCYTE %: 5.8 % (ref 4.0–13.0)
NEUTROPHIL %: 62 % (ref 40.0–75.0)
NRBC %: 0 % (ref 0.0–0.0)
PLATELET COUNT: 333 10*3/uL (ref 150–400)
RBC DISTRIBUTION WIDTH STD DEV: 47.9 fL — ABNORMAL HIGH (ref 35.1–46.3)
RED BLOOD CELL COUNT: 5.05 M/uL (ref 3.90–5.20)
WHITE BLOOD CELL COUNT: 10.1 10*3/uL (ref 4.0–11.0)

## 2022-12-10 LAB — BASIC METABOLIC PANEL
ANION GAP: 12 mmol/L (ref 10–22)
BUN (UREA NITROGEN): 12 mg/dL (ref 7–18)
CALCIUM: 9.6 mg/dL (ref 8.5–10.5)
ESTIMATED GLOMERULAR FILT RATE: >60 mL/min (ref >60)
POTASSIUM: 4 mmol/L (ref 3.5–5.1)

## 2022-12-10 LAB — COVID-19 INPATIENT: COVID-19 INPATIENT: NEGATIVE

## 2022-12-10 MED ORDER — SENNOSIDES 8.6 MG PO TABS
17.2000 mg | ORAL_TABLET | Freq: Every evening | ORAL | Status: DC
Start: 2022-12-10 — End: 2022-12-10
  Filled 2022-12-10: qty 2

## 2022-12-10 MED ORDER — ATORVASTATIN CALCIUM 80 MG PO TABS
80.0000 mg | ORAL_TABLET | Freq: Every day | ORAL | Status: DC
Start: 2022-12-10 — End: 2022-12-10
  Administered 2022-12-10: 80 mg via ORAL
  Filled 2022-12-10: qty 1

## 2022-12-10 MED ORDER — NICOTINE POLACRILEX 2 MG MT GUM
2.0000 mg | CHEWING_GUM | OROMUCOSAL | Status: DC | PRN
Start: 2022-12-10 — End: 2022-12-10
  Administered 2022-12-10 (×4): 2 mg via ORAL
  Filled 2022-12-10 (×4): qty 1

## 2022-12-10 MED ORDER — SPIRONOLACTONE 100 MG PO TABS
100.0000 mg | ORAL_TABLET | Freq: Every day | ORAL | Status: DC
Start: 2022-12-10 — End: 2022-12-10
  Administered 2022-12-10: 100 mg via ORAL
  Filled 2022-12-10: qty 1

## 2022-12-10 MED ORDER — METHOCARBAMOL 500 MG PO TABS
500.0000 mg | ORAL_TABLET | Freq: Three times a day (TID) | ORAL | Status: DC
Start: 2022-12-10 — End: 2022-12-10
  Administered 2022-12-10 (×2): 500 mg via ORAL
  Filled 2022-12-10 (×2): qty 1

## 2022-12-10 MED ORDER — LEVETIRACETAM 500 MG PO TABS
750.0000 mg | ORAL_TABLET | Freq: Two times a day (BID) | ORAL | Status: DC
Start: 2022-12-11 — End: 2022-12-14
  Administered 2022-12-11 – 2022-12-14 (×7): 750 mg via ORAL
  Filled 2022-12-10 (×7): qty 1

## 2022-12-10 MED ORDER — NICOTINE POLACRILEX 2 MG MT GUM
2.0000 mg | CHEWING_GUM | OROMUCOSAL | Status: DC | PRN
Start: 2022-12-10 — End: 2022-12-11
  Administered 2022-12-11 (×2): 2 mg via ORAL
  Filled 2022-12-10 (×2): qty 1

## 2022-12-10 MED ORDER — ACETAMINOPHEN 325 MG PO TABS
650.0000 mg | ORAL_TABLET | ORAL | Status: DC | PRN
Start: 2022-12-10 — End: 2022-12-14

## 2022-12-10 MED ORDER — BUSPIRONE HCL 5 MG PO TABS
15.0000 mg | ORAL_TABLET | Freq: Every day | ORAL | Status: DC
  Administered 2022-12-10: 15 mg via ORAL
  Filled 2022-12-10: qty 1

## 2022-12-10 MED ORDER — FLUOXETINE HCL 20 MG PO CAPS
40.0000 mg | ORAL_CAPSULE | Freq: Every day | ORAL | Status: DC
Start: 2022-12-10 — End: 2022-12-10
  Administered 2022-12-10: 40 mg via ORAL
  Filled 2022-12-10: qty 2

## 2022-12-10 MED ORDER — OXCARBAZEPINE 150 MG PO TABS
150.0000 mg | ORAL_TABLET | Freq: Two times a day (BID) | ORAL | Status: DC
Start: 2022-12-11 — End: 2022-12-14
  Administered 2022-12-11 – 2022-12-14 (×7): 150 mg via ORAL
  Filled 2022-12-10 (×7): qty 1

## 2022-12-10 MED ORDER — BENZTROPINE MESYLATE 0.5 MG PO TABS
0.5000 mg | ORAL_TABLET | Freq: Four times a day (QID) | ORAL | Status: DC | PRN
Start: 2022-12-10 — End: 2022-12-11

## 2022-12-10 MED ORDER — TRAZODONE HCL 50 MG PO TABS
50.0000 mg | ORAL_TABLET | Freq: Every evening | ORAL | Status: DC | PRN
  Administered 2022-12-10: 50 mg via ORAL
  Filled 2022-12-10: qty 1

## 2022-12-10 MED ORDER — BUPRENORPHINE HCL-NALOXONE HCL 8-2 MG SL SUBL
8.0000 mg | SUBLINGUAL_TABLET | Freq: Two times a day (BID) | SUBLINGUAL | Status: DC
Start: 2022-12-10 — End: 2022-12-10
  Administered 2022-12-10 (×2): 1 via SUBLINGUAL
  Filled 2022-12-10 (×2): qty 1

## 2022-12-10 MED ORDER — ALUMINUM-MAGNESIUM-SIMETHICONE 200-200-20 MG/5ML PO SUSP
30.0000 mL | ORAL | Status: DC | PRN
Start: 2022-12-10 — End: 2022-12-14

## 2022-12-10 MED ORDER — BUPRENORPHINE HCL-NALOXONE HCL 8-2 MG SL SUBL
8.0000 mg | SUBLINGUAL_TABLET | Freq: Three times a day (TID) | SUBLINGUAL | Status: DC
Start: 2022-12-11 — End: 2022-12-14
  Administered 2022-12-11 – 2022-12-14 (×10): 1 via SUBLINGUAL
  Filled 2022-12-10 (×10): qty 1

## 2022-12-10 MED ORDER — TIOTROPIUM BROMIDE MONOHYDRATE 2.5 MCG/ACT IN AERS
2.0000 | INHALATION_SPRAY | Freq: Every day | RESPIRATORY_TRACT | Status: DC
Start: 2022-12-10 — End: 2022-12-10
  Administered 2022-12-10: 2 via RESPIRATORY_TRACT
  Filled 2022-12-10: qty 4

## 2022-12-10 MED ORDER — MELATONIN 3 MG PO TABS
5.0000 mg | ORAL_TABLET | Freq: Every evening | ORAL | Status: DC
Start: 2022-12-10 — End: 2022-12-10
  Administered 2022-12-10: 5.25 mg via ORAL
  Filled 2022-12-10: qty 2

## 2022-12-10 MED ORDER — ACETAMINOPHEN 325 MG PO TABS
650.0000 mg | ORAL_TABLET | ORAL | Status: DC | PRN
Start: 2022-12-10 — End: 2022-12-10
  Administered 2022-12-10 (×2): 650 mg via ORAL
  Filled 2022-12-10 (×2): qty 2

## 2022-12-10 MED ORDER — OLANZAPINE 5 MG PO TBDP
2.5000 mg | ORAL_TABLET | Freq: Four times a day (QID) | ORAL | Status: DC | PRN
Start: 2022-12-10 — End: 2022-12-14

## 2022-12-10 MED ORDER — LEVOTHYROXINE SODIUM 25 MCG PO TABS: 25.0000 ug | ORAL_TABLET | Freq: Every morning | ORAL | Status: DC

## 2022-12-10 MED ORDER — LORAZEPAM 0.5 MG PO TABS
0.5000 mg | ORAL_TABLET | Freq: Four times a day (QID) | ORAL | Status: DC | PRN
Start: 2022-12-10 — End: 2022-12-11
  Administered 2022-12-11: 0.5 mg via ORAL
  Filled 2022-12-10: qty 1

## 2022-12-10 MED ORDER — GABAPENTIN 300 MG PO CAPS
600.0000 mg | ORAL_CAPSULE | Freq: Two times a day (BID) | ORAL | Status: DC
Start: 2022-12-11 — End: 2022-12-14
  Administered 2022-12-11 – 2022-12-14 (×7): 600 mg via ORAL
  Filled 2022-12-10 (×7): qty 2

## 2022-12-10 MED ORDER — LEVETIRACETAM 500 MG PO TABS
750.0000 mg | ORAL_TABLET | Freq: Two times a day (BID) | ORAL | Status: DC
Start: 2022-12-10 — End: 2022-12-10
  Administered 2022-12-10 (×2): 750 mg via ORAL
  Filled 2022-12-10 (×2): qty 1

## 2022-12-10 MED ORDER — PRAZOSIN HCL 1 MG PO CAPS
2.0000 mg | ORAL_CAPSULE | Freq: Every evening | ORAL | Status: DC
Start: 2022-12-11 — End: 2022-12-14
  Administered 2022-12-11 – 2022-12-13 (×3): 2 mg via ORAL
  Filled 2022-12-10 (×4): qty 2

## 2022-12-10 MED ORDER — PANTOPRAZOLE SODIUM 40 MG PO TBEC
40.00 mg | DELAYED_RELEASE_TABLET | Freq: Once | ORAL | Status: AC
Start: 2022-12-10 — End: 2022-12-10
  Administered 2022-12-10: 40 mg via ORAL
  Filled 2022-12-10: qty 1

## 2022-12-10 MED ORDER — GABAPENTIN 300 MG PO CAPS
600.0000 mg | ORAL_CAPSULE | Freq: Three times a day (TID) | ORAL | Status: DC
Start: 2022-12-10 — End: 2022-12-10
  Administered 2022-12-10 (×2): 600 mg via ORAL
  Filled 2022-12-10 (×2): qty 2

## 2022-12-10 MED ORDER — OXCARBAZEPINE 150 MG PO TABS
150.0000 mg | ORAL_TABLET | Freq: Two times a day (BID) | ORAL | Status: DC
  Administered 2022-12-10 (×2): 150 mg via ORAL
  Filled 2022-12-10 (×2): qty 1

## 2022-12-10 MED ORDER — BACLOFEN 10 MG PO TABS
10.0000 mg | ORAL_TABLET | Freq: Two times a day (BID) | ORAL | Status: DC
  Administered 2022-12-10 (×2): 10 mg via ORAL
  Filled 2022-12-10 (×2): qty 1

## 2022-12-10 MED ORDER — MAGNESIUM HYDROXIDE 400 MG/5ML PO SUSP
30.0000 mL | Freq: Every day | ORAL | Status: DC | PRN
Start: 2022-12-10 — End: 2022-12-11

## 2022-12-10 NOTE — RN Shift Note (Signed)
Pt arrived on unit at 11 PM via ambulance via stretcher, condition stable, on oxygen at 2 L sat 94%, rest of VS WNL. Pt denies SI/HI. Pt with irritable edge, declined skin assessment, refused beacon. Security present on unit, and scanned pt belongings.

## 2022-12-10 NOTE — ED Triage Note (Signed)
Pt presents to ED from triage reports depression, SI with plan to blow self up with oxygen tank at home, and withdrawal from suboxone. Pt states conflicts ongoing with home health nurse and has not received suboxone since 3/19. Pt reports depression over sons death. Pt calm and cooperative.

## 2022-12-10 NOTE — ED Notes (Signed)
This patient was signed out to me at change of shift by my colleague.    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 here with Suicidal ideation .     Please see ED course below for updates on clinical course.     ED Course as of 12/10/22 1702   Sat Dec 09, 2133   2354 52 year old F COPD on home O2, chronic pain p/w SI (plan to blow up her O2) iso lost VNA services, unable to access her locked medications       No acute events this shift unless noted otherwise above.       Cleta Alberts. Pearline Cables, MD

## 2022-12-10 NOTE — Narrator Note (Signed)
[name] meeting with patient

## 2022-12-10 NOTE — Narrator Note (Signed)
Pt requesting more comfortable bed - patient placed on inpatient stretcher for comfort  Pt appreciative of staff

## 2022-12-10 NOTE — IPASS Handoff (Signed)
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  Patient uses Pronouns: She/Her/Hers     Guardian/Custody: N/A    Patient's preferred language is [place].    A:    Chief complaint: SI with a plan    Patient's current symptoms are Mood changes    Disruptive behavior: N/A    Has the patient been in restraints in the ED?  No    Has the patient had a recent assault in the ED?  No    Has the patient attempted to elope from the ED?  No    Vital signs:  BP 110/75   Pulse 71   Temp 96 F (Temporal)   Resp 18   Wt (!) 158.8 kg (350 lb)   SpO2 97%   BMI 51.69 kg/m   Pain Score: 7 (7/10)        Patient experiencing pain? (describe) Yes : Chronic bilateral leg pain    Medications administered in ED:  Medications   acetaminophen (TYLENOL) tablet 650 mg (650 mg Oral Given 12/10/22 2110)   methocarbamol (ROBAXIN) tablet 500 mg (500 mg Oral Given 12/10/22 2112)   atorvastatin (LIPITOR) tablet 80 mg (80 mg Oral Given 12/10/22 1228)   baclofen (LIORESAL) tablet 10 mg (10 mg Oral Given 12/10/22 2110)   buprenorphine-naloxone (SUBOXONE) 8-2 MG SL tablet 1 tablet (1 tablet Sublingual Given 12/10/22 2112)   busPIRone (BUSPAR) tablet 15 mg (15 mg Oral Given 12/10/22 1228)   levETIRAcetam (KEPPRA) tablet 750 mg (750 mg Oral Given 12/10/22 2111)   levothyroxine (SYNTHROID) tablet 25 mcg (has no administration in time range)   OXcarbazepine (TRILEPTAL) tablet 150 mg (150 mg Oral Given 12/10/22 2110)   melatonin tablet 5.25 mg (5.25 mg Oral Given 12/10/22 2110)   traZODone  (DESYREL) tablet 50 mg (50 mg Oral Given 12/10/22 2112)   tiotropium (SPIRIVA RESPIMAT) inhaler 2 puff (2 puffs Inhalation Given 12/10/22 1230)   spironolactone (ALDACTONE) tablet 100 mg (100 mg Oral Given 12/10/22 1234)   sennosides (SENOKOT) tablet 17.2 mg (17.2 mg Oral Not Given 12/10/22 2117)   FLUoxetine (PROzac) capsule 40 mg (40 mg Oral Given 12/10/22 1228)   nicotine polacrilex (NICORETTE) gum 2 mg (2 mg Oral Given 12/10/22 2112)   gabapentin (NEURONTIN) capsule 600 mg (600 mg Oral Given 12/10/22 2111)   pantoprazole (PROTONIX) EC tablet 40 mg (40 mg Oral Given 12/10/22 1229)       S:   Katheren Puller does not have current mental or physical or communication impairment. If yes, describe.     Patient was brought into the ED via Self.    Current legal status: Section 78    S:   The patient has no critical findings.  If yes, describe.    Taya is allergic to bee venom, metolazone, trazodone, methylprednisolone, nutritional supplements, tegretol [carbamazepine], and hydroxyzine.  ED Fall Risk Score: 0  The patient is currently on suicide precautions.     Patient reports suicidal ideation with plan.  * Suicidal Ideation?: Yes  Suicidal Ideation (Current): Yes  Suicidal Ideation Means/Plan (Current): "  to blow up her oxygen tank"     Suicide Attempt (Current): No       C-SSRS Triage Screener Risk Score: High Risk    Patient reports use of N/A.  Withdrawal sxs:  N/A    Sexual behavior hx:  N/A  Is this patient a registered sex offender?  no    Does the patient have a hx of violence?  no   If yes, describe.  Current Attempt to Harm: No     Homicidal Ideation?: Denies     Homicidal Ideation With Intent: No        Access to Weapons: Denies     Self Destructive Behaviors: Denies        Self Inflicted Injury: Denies        Aggression/Poor Impulse Control: Denies        Medical Conditions Restraint Risk: No  Abuse History Restraint Risk: No       Do you feel unsafe in your relationship(s)?: Not in a relationship  Other Persons  at Risk: None        Abuse/Trauma (Current): None  Restraining Order: No       Was a patient search completed? Yes  Belongings searched and Wanded by [employer]   There was not evidence of contraband and/or weapons.      Verbal summary by receiver to confirm understanding of Handoff information.

## 2022-12-10 NOTE — Narrator Note (Signed)
Pt up to bedside commode. Urine specimen collected.
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
## 2022-12-10 NOTE — Narrator Note (Signed)
Oxygen applied per patients request - states she wears O2 at night time    Pt resting comfortably  Resp even and unlabored.

## 2022-12-10 NOTE — PES NOTES (Signed)
[name] NOTE    Spoke with [facility] regarding authorization [id number]    Auth number: [id number]  Approved 3/23 to be reviewed on 27th (5 covered days)  Approved by Lanny Cramp A.    If needed to change the start date (if pt is moved over tomorrow), can call same number, with extension: Theone Murdoch [phone]    Main behavioral health extension: [phone]    Leo Grosser, MD  PGY3 Psychiatry  [phone]

## 2022-12-10 NOTE — Narrator Note (Signed)
Pt resting on stretcher, lunch tray received. Safety watch initiated.

## 2022-12-10 NOTE — ED Provider Notes (Addendum)
EMERGENCY DEPARTMENT  NOTE          History of Present Illness:    This 52 year old female patient presents with suicidal ideations.  Patient states recently she has been depressed, has been having thoughts of wanting to blow up her oxygen tank to kill herself.  Patient states recently she fired her home visiting nurse who helps her with her medications, so does not have access to her medications over the past few days.  Patient also states she has chronic pain and was recently taken off Suboxone and feels like she needs to go back on it.  Pain is chronic and denies any recent injuries.  Patient reports intermittent palpitations, denies any chest pain or difficulty breathing.  Patient denies any other medical complaints        Physical Exam:  GENERAL:  52 year old female in no apparent distress.    VITAL SIGNS:  BP 136/88   Pulse 88   Temp 98.6 F   Resp 20   Wt (!) 158.8 kg (350 lb)   SpO2 95%   BMI 51.69 kg/m     HEENT:  Head atraumatic.  PERRL.  Extraocular muscles intact.  Neck is supple.  CHEST:  Lungs are clear to auscultation bilaterally.  No wheeze or crackles.   CARDIAC:  RRR, S1/S2 present, no m/r/g     BACK:  No midline ttp, no flank ttp  ABDOMEN:  Soft, nontender, non distended, no obvious organomegaly. BS present  EXTREMITIES:  Warm and well perfused. Pulses are 2+ bilateral.  No edema.    SKIN:  Warm and dry.  No rash.    NEUROLOGIC: Cranial nerves grossly intact, no focal motor/sensory deficits.  Alert and appropriate.         ED Course and Medical Decision Making: 52 year old female with suicidal ideations, also cannot access her medications.  Lab work for medical clearance purposes was unremarkable, Patient does not have any pulmonary symptoms and her examination is benign, no indication for chest x-ray. patient medically cleared.        Disposition planning discussed with crisis team. We are in agreement on the plan for the patient: bed search for inpatient level of care.  Likely because  of oxygen requirement will require geriatric psychiatry inpatient bed search.      Patient signed out to ED provider at change of shift.    Relevant Comorbidities: Heart failure with preserved EF, A-fib, COPD, tobacco use, borderline personality disorder, major depressive disorder, chronic pain/sciatica, spinal stenosis, PTSD schizoaffective disorder  Review of results of each test: Unremarkable CBC, BMP, drug screen.  Urine buprenorphine screen is positive  Social determinants of health: Patient has no access to medications because does not have home nursing due to her firing them, cannot care for self  My independent review of studies: My independent EKG interpretation shows normal sinus rhythm rate 73 bpm without any ST segment elevations or depressions, no T wave inversions, normal axis.         Orders:   Orders Placed This Encounter      CBC, Platelet & Differential          Standing Status: Standing          Number of Occurrences: 1          Order Specific Question: Release result to patient via [place]          Answer: Immediate [1]      Basic Metabolic Panel  Standing Status: Standing          Number of Occurrences: 1          Order Specific Question: Release result to patient via [facility]          Answer: Immediate [1]      Serum Drug Screen          Standing Status: Standing          Number of Occurrences: 1          Order Specific Question: Release result to patient via [place]          Answer: Immediate [1]      Urine Drug Screen          Standing Status: Standing          Number of Occurrences: 1          Order Specific Question: Release result to patient via [place]          Answer: Immediate [1]      COVID-19 Inpatient          Standing Status: Standing          Number of Occurrences: 1          Order Specific Question: Release result to patient via [facility]          Answer: Immediate [1]      EKG : Initial          Standing Status: Standing          Number of Occurrences: 1          Order  Specific Question: EKG Status:          Answer: Initial          Order Specific Question: Indication for Exam (EKG):          Answer: Chest Pain-R07.9          Order Specific Question: Release result to patient via [facility]          Answer: Immediate [1]      acetaminophen (TYLENOL) tablet 650 mg      methocarbamol (ROBAXIN) tablet 500 mg      atorvastatin (LIPITOR) tablet 80 mg      baclofen (LIORESAL) tablet 10 mg      buprenorphine-naloxone (SUBOXONE) 8-2 MG SL tablet 1 tablet      busPIRone (BUSPAR) tablet 15 mg      levETIRAcetam (KEPPRA) tablet 750 mg      levothyroxine (SYNTHROID) tablet 25 mcg      pantoprazole (PROTONIX) EC tablet 40 mg      OXcarbazepine (TRILEPTAL) tablet 150 mg      melatonin tablet 5.25 mg      traZODone (DESYREL) tablet 50 mg      tiotropium (SPIRIVA RESPIMAT) inhaler 2 puff      spironolactone (ALDACTONE) tablet 100 mg      sennosides (SENOKOT) tablet 17.2 mg      FLUoxetine (PROzac) capsule 40 mg      nicotine polacrilex (NICORETTE) gum 2 mg      gabapentin (NEURONTIN) capsule 600 mg          Results:  Results for orders placed or performed during the hospital encounter of 12/10/22 (from the past 24 hour(s))   CBC, Platelet & Differential    Collection Time: 12/10/22 11:35 AM   Result Value    WHITE BLOOD CELL COUNT 10.1    RED BLOOD CELL COUNT 5.05  HEMOGLOBIN 15.0    HEMATOCRIT 46.7 (H)    MEAN CORPUSCULAR VOL 92.5    MEAN CORPUSCULAR HGB 29.7    MEAN CORP HGB CONC 32.1    RBC DISTRIBUTION WIDTH STD DEV 47.9 (H)    PLATELET COUNT 333    MEAN PLATELET VOLUME 11.2    NEUTROPHIL % 62.0    IMMATURE GRANULOCYTE % 0.4    LYMPHOCYTE % 30.3    MONOCYTE % 5.8    EOSINOPHIL % 1.4    BASOPHIL % 0.1    NRBC % 0.0    ABSOLUTE NEUTROPHIL COUNT 6.2    ABSOLUTE IMM GRAN COUNT 0.04    ABSOLUTE LYMPH COUNT 3.1    ABSOLUTE MONO COUNT 0.6    ABSOLUTE EOSINOPHIL COUNT 0.1    ABSOLUTE BASO COUNT 0.0    ABSOLUTE NRBC COUNT 0.0   Basic Metabolic Panel    Collection Time: 12/10/22 11:35 AM   Result Value     SODIUM 140    POTASSIUM 4.0    CHLORIDE 99    CARBON DIOXIDE 29    ANION GAP 12    CALCIUM 9.6    Glucose Random 119    BUN (UREA NITROGEN) 12    CREATININE 0.8    ESTIMATED GLOMERULAR FILT RATE > 60   Serum Drug Screen    Collection Time: 12/10/22 11:35 AM   Result Value    SALICYLATE < 0.5    ACETAMINOPHEN < 5    ETHANOL < 10   Urine Drug Screen    Collection Time: 12/10/22  1:25 PM   Result Value    AMPHETAMINES URINE NEGATIVE    COCAINE METABOLITES URINE NEGATIVE    OPIATES URINE NEGATIVE    BENZODIAZEPINES URINE NEGATIVE    CANNABINOIDS URINE NEGATIVE    ETHANOL URINE NEGATIVE    BUPRENORPHINE SCREEN URINE POSITIVE (A)    OXYCOD SCRN URINE NEGATIVE    FENTANYL URINE NEGATIVE    METHADONE URINE NEGATIVE    SPECIMEN VALIDITY URINE CREAT 57    SPECIMEN VALIDITY URINE PH 6.4    SPEC VALIDITY SPECIFIC GRAVITY 1.010    Narrative    URINE&Urine             Critical Care Time: 0 minutes        PROVISIONARY DIAGNOSIS:  Suicidal ideation              Konrad Saha, MD, 12/10/2022

## 2022-12-10 NOTE — PES EVALUATION NOTE (Signed)
PSYCHIATRIC EMERGENCY SERVICE  Evaluation Note    Identifying Data: Patient is a 52 year old housed, disable with diagnosis hx of PTSD, schizoaffective disorder bipolar type, past SUD, MDD and borderline personality disorder, multiple prior psychiatric hospitalizations, most recent ([facility] 07/15/22-07/20/22), hx of SI and SIB requiring medical attention. Patient self-presented to ED with worsening depression and suicidal thought with plan.     Source of Information: Patient, EMR    Chief Complaint: "I do not see any point in living."    Source of referral: Patient    History of Present Illness (include acute symptoms, precipitants, changes in  functional status, and level of distress):   RN Triage Note:   "Pt presents to ED from triage reports depression, SI with plan to blow self up with oxygen tank at home, and withdrawal from suboxone. Pt states conflicts ongoing with home health nurse and has not received suboxone since 3/19. Pt reports depression over sons death. Pt calm and cooperative."    MD Note:   "This 52 year old female patient presents with suicidal ideations.  Patient states recently she has been depressed, has been having thoughts of wanting to blow up her oxygen tank to kill herself.  Patient states recently she fired her home visiting nurse who helps her with her medications, so does not have access to her medications over the past few days.  Patient also states she has chronic pain and was recently taken off Suboxone and feels like she needs to go back on it.  Pain is chronic and denies any recent injuries.  Patient reports intermittent palpitations, denies any chest pain or difficulty breathing.  Patient denies any other medical complaints."     Chart Review:   Dr. Patrecia Pace,  Admission 07/15/22 -07/20/22 discharge summary:  "On the psychiatric unit, patient was demanding with needs and pushed boundaries (asked for razor to shave repeatedly despite setting limit she would not be provided one here,  so shortly after her SIB resulting in numerous superficial scabbing to arms and legs). Otherwise she remained behaviorally appropriate and did not have any assaultive or aggressive behaviors.  She mostly appeared to remain in her room and use the tablet watching shows. She did wheel around the unit in her wheelchair when she had needs. She was not seen using her oxygen during the daytime. There were no physical or chemical restraints.  Psychiatrically, patient presenting concerns rapidly resolved within a day of hospitalization. She reports no recollection of the events that brought her into the hospital. It is certainly possible given the high emotionality related to her deceased son, the affective lability/volatility - patient experienced a traumatic/dissociative episode. She adamantly denied any SI/HI/AVH throughout hospitalization, and appeared appropriate (not internally preoccupied or odd or psychotic). Minor adjustments  to medications were made, including discontinuing quetiapine (which the psychiatric NP at her facility agreed with), increasing buspirone from 15mg  to 20mg  TID for anxiety, and initiating mirtazapine 7.5mg  nightly to help with sleep. Patient reported no issues with these medication changes."    Collateral Contacts:  Pt refuse to provide any collateral.     Upon interview with patient:   Pt was lying on the bed, calm and corporative, pt reports her depression got worsening past two weeks and having thoughts of wanting to blow up the oxygen tank (using a lighter) to kill herself. Pt reports that she fired home visiting nurse who helps her with her medications and did not have access to her medication for two  days. Pt got very emotional and tearful, reports that she keep seeing her deceased son. Pt reports lack of sleep past two night, poor appetite, and depressed mood.   Pt recently moved to her new apartment two months ago, prior she was [name] for six months. Pt reports only  services she has is home visiting nurse for medication management. Her case manager from insurance is working to have home health aide for couple of hours daily. Pt reports she stays alone her apartment most of time and has no family or social support. She does have a one friend who helps to get grocery and mostly gets frozen food easy to microwave. Pt reports she is not currently connected with [place].   Pt denies HI/AH/VH and states only hallucinations she has is seeing her deceased son.     Active Medical Problems:   Patient Active Problem List    Osteoarthritis of spine with radiculopathy, lumbar region         Date Noted: 08/09/2022      Chronic bilateral low back pain with bilateral sciatica         Date Noted: 08/09/2022      Obesity hypoventilation syndrome ([place])         Date Noted: 04/09/2022      Chest pain, atypical         Date Noted: 04/08/2022      Stable angina         Date Noted: 04/07/2022      Lung nodule         Date Noted: 02/03/2022            12/30/2021 CT Chest: Hyperinflation with dependent            atelectasis and a 7 mm nodule in the right upper            lobe. A follow-up chest CT is recommended in 12            months per Fleischner criteria.                               Hypokalemia         Date Noted: 01/11/2022      Requires continuous at home supplemental oxygen         Date Noted: 12/23/2021      Spinal stenosis of lumbar 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 claudication         Date Noted: 12/23/2021      Fall in home         Date Noted: 12/23/2021      Gait instability         Date Noted: 12/23/2021      MDD (major depressive disorder), recurrent, severe, with psychosis ([place])      Suicidal ideation         Date Noted: 12/20/2021      Alcohol use disorder         Date Noted: 12/20/2021      Borderline personality disorder ([place])         Date Noted: 12/20/2021      (HFpEF) heart failure with preserved ejection fraction ([place])         Date Noted: [id number]      Systolic congestive heart failure  ([place])         Date Noted: 12/03/2021      Seizure disorder ([place])  Date Noted: 12/03/2021      Schizoaffective disorder, depressive type [facility])         Date Noted: 12/03/2021      Tobacco use disorder         Date Noted: 12/02/2021            TTP July 2023 [place]      Suicidal behavior with attempted self-injury [facility])         Date Noted: 11/27/2021      Morbid obesity ([place])         Date Noted: 09/11/2021      Afib ([place])         Date Noted: 10/24/2019      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   History of Psychiatric Hospitalizations (include dates and locations): [facility], admission 07/15/22-07/20/22.   Current/Most Recent Outpatient Care: No current OP care. PCP Dr Cora Collum [phone].  Safety Concerns: Suicidal, hx of SI/SA  Medication/Treatment Trials: None reported    Family History:  No family history on file.      Family History of Mental Illness or Substance Abuse:Yes, both side of parents.     Substance Abuse:  Substance Use Screen  Chemical Use (other than as prescribed) in the past 12 months?: No  Family History of Substance Use: Yes both parents              Alcohol  * Alcohol Use in past 12 months: No    Trauma History:   Abuse/Trauma History: Emotional;Physical;Sexual    Pertinent Past Medical/Surgical History:  Past Medical History:  schizo: Bipolar affective ([place])  No date: Chronic systolic CHF (congestive heart failure) ([place])  No date: Depression  12/23/2021: Fall in home  12/23/2021: Gait instability  12/23/2021: Requires continuous at home supplemental  oxygen  depress: 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    Allergies  Review of Patient's Allergies indicates:   Bee venom               Anaphylaxis   Metolazone              Other (See Comments)    Comment:Hypokalemia   Methylprednisolone      Dizziness, Drowsiness    Comment:Also believes syncope. Seems to tolerate             inhalational and epidural steroid.   Nutritional supplem*       Tegretol [carbamaze*    Hives   Hydroxyzine             Nausea Only      Pre-Admission Medications:  Prior to Admission Medications   Prescriptions Last Dose Informant Patient Reported? Taking?  Budeson-Glycopyrrol-Formoterol 160-9-4.8 MCG/ACT AERO   Yes No   Sig: Inhale 2 puffs into the lungs in the morning and 2 puffs before bedtime.   EPINEPHrine (EPIPEN) 0.3 MG/0.3ML injection   Yes No   Sig: Inject 0.3 mg into the muscle daily as needed   FLUoxetine (PROZAC) 40 MG capsule   Yes No   Sig: Take 40 mg by mouth in the morning.   Melatonin 5 MG TABS Tablet   Yes No   Sig: Take 5 mg by mouth nightly   Multiple Vitamin (MULTIVITAMIN) tablet   No No   Sig: Take 1 tablet by mouth in the morning.   OTHER MEDICATION   No No   Sig: 1 each by Other route in the morning. Wheelchair: Bariatric motorized wheel chare (height 5'9", weight 328 lb, bmi 48.4)  For use as directed.    Please fax to [facility] at [phone]  DX: Spinal stenosis of lumbar 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 claudication, Gait instability, Morbid obesity ([name])  ICD10:M48.061, R26.81, E66.01.   OXcarbazepine (TRILEPTAL) 150 MG tablet   No No   Sig: Take 1 tablet by mouth in the morning and 1 tablet before bedtime.   Potassium Chloride ER 20 MEQ TBCR   No No   Sig: Take 20 mEq by mouth in the morning.   acetaminophen (TYLENOL) 325 MG tablet   Yes No   Sig: Take 2 tablets by mouth every 6 (six) hours as needed for Pain   atorvastatin  (LIPITOR) 80 MG tablet   No No   Sig: Take 1 tablet by mouth in the morning.   baclofen (LIORESAL) 10 MG tablet   Yes No   Sig: Take 1 tablet by mouth in the morning and 1 tablet before bedtime.   bumetanide (BUMEX) 1 MG tablet   No No   Sig: Take 4 tablets by mouth in the morning.   buprenorphine-naloxone (SUBOXONE) 8-2 MG sublingual tablet   No No   Sig: Place 1 tablet under the tongue in the morning and 1 tablet at noon and 1 tablet before bedtime. Max Daily Amount: 3 tablets. Do all this for 7 days.   busPIRone (BUSPAR) 10 MG tablet   Yes No   Sig: Take 2 tablets by mouth in the morning and 2 tablets at noon and 2 tablets before bedtime.   diclofenac (VOLTAREN) 1 % GEL Gel   Yes No   Sig: Apply 2 g topically in the morning and 2 g at noon and 2 g in the evening and 2 g before bedtime.   fluticasone (FLONASE) 50 MCG/ACT nasal spray   Yes No   Sig: 2 sprays by Each Nostril route in the morning and 2 sprays before bedtime.   gabapentin (NEURONTIN) 300 MG capsule   No No   Sig: Take 2 capsules by mouth in the morning and 2 capsules at noon and 2 capsules before bedtime.   gabapentin (NEURONTIN) 600 MG tablet   No No   Sig: Take 1 tablet by mouth in the morning and 1 tablet before bedtime.   hydrOXYzine (ATARAX) 25 MG tablet   Yes No   Sig: Take 25 mg by mouth every 6 (six) hours as needed for Anxiety   levETIRAcetam (KEPPRA) 750 MG tablet   No No   Sig: Take 1 tablet by mouth in the morning and 1 tablet before bedtime.   levothyroxine (SYNTHROID) 25 MCG tablet   No No   Sig: Take 1 tablet  by mouth every morning before breakfast   methocarbamol (ROBAXIN) 500 MG tablet   No No   Sig: Take 1.5 tablets by mouth in the morning and 1.5 tablets at noon and 1.5 tablets before bedtime.   nicotine (NICODERM CQ) 14 MG/24HR   No No   Sig: Place 1 patch onto the skin in the morning for 14 days.   nicotine (NICODERM CQ) 21 MG/24HR   No No   Sig: Place 1 patch onto the skin in the morning.   nicotine (NICODERM CQ) 7 MG/24HR   No  No   Sig: Place 1 patch onto the skin in the morning for 14 days.   nicotine polacrilex (NICORETTE) 4 MG gum   No No   Sig: Take 1 each by mouth as needed for Craving (Nicotine)   pantoprazole (PROTONIX) 40 MG tablet   Yes No   Sig: Take 1 tablet by mouth in the morning.   polyethylene glycol (GLYCOLAX/MIRALAX) 17 g packet   No No   Sig: Take 1 packet by mouth in the morning.   prazosin (MINIPRESS) 2 MG capsule   Yes No   Sig: Take 1 capsule by mouth nightly   sennosides (SENOKOT) 8.6 MG tablet   Yes No   Sig: Take 2 tablets by mouth at bedtime   spironolactone (ALDACTONE) 100 MG tablet   No No   Sig: Take 1 tablet by mouth in the morning.   thiamine (VITAMIN B-1) 100 mg tablet   Yes No   Sig: Take 100 mg by mouth in the morning.   tiotropium (SPIRIVA RESPIMAT) 2.5 MCG/ACT aerosol solution   Yes No   Sig: Inhale 2 puffs into the lungs in the morning.   tiotropium (SPIRIVA) 18 MCG inhalation capsule   Yes No   Sig: Inhale 1 capsule into the lungs in the morning.   traZODone (DESYREL) 50 MG tablet   Yes No   Sig: Take 1 tablet by mouth nightly as needed for Sleep      Facility-Administered Medications: None       Psychosocial History:       Psychosocial  Military: No  Education : Grade/middle school  Community Support: Primary Care Provider  Relationship Status: Divorced  Publishing copy for Someone: No  Providing self care at home?: No  Assistance Provided By:: VNA services at home    Mental Status Exam:   Mental Status Exam  General Appearance: Clean;Dressed appropriately  Behavior: Cooperative;Difficult to engage  Level of Consciousness: Alert  Orientation Level: Grossly intact  Attention/Concentration: WNL  Mannerisms/Movements: No abnormal mannerisms/movements  Speech Quality and Rate: WNL  Speech Clarity: Clear  Speech Tone: Soft  Vocabulary/Fund of Knowledge: WNL  Memory: Grossly intact  Thought Process & Associations: Goal-directed  Dissociative Symptoms: None  Thought Content: No abnormalities reported or  observed  Hallucinations: Visual ("Keeping seeing my death son")  Suicidal Thoughts: Active thoughts;Plans  Homicidal Thoughts: None  Mood: Depressed/Sad  Mood comment: "I am very depressed"  Affect: Congruent with mood  Judgment: Poor  Insight: Poor      Risk Assessment:  Suicide Risk Assessment  * Suicidal Ideation?: Yes  Suicidal Ideation (Current): Yes  Suicidal Ideation Means/Plan (Current): "to blow up her oxygen tank"  Suicidal Ideation (History): Yes  Suicidal Ideation Means/Plan (History): SI year with plan to OD  Suicide Attempt?: Yes  Suicide Attempt (Current): No  Suicide Attempt (History): Yes  Suicide Attempt Means (History): "Two years jumpled into incoming traffic"  Family Suicide  Attempt?: Unknown  Violence Risk  History of Violence?: No  Current Attempt to Harm: No  Homicidal Ideation?: Denies  Homicidal Ideation With Intent: No  Access to Weapons: Denies  Self Destructive Behaviors: Denies  Self Inflicted Injury: Denies  Aggression/Poor Impulse Control: Denies  Medical Conditions Restraint Risk: No  Abuse History Restraint Risk: No       Current Outpatient Treatment:  Current OP Treatment   OP Provider: Other  Other: Name: PCP- Dr. Cora Collum  Other: Phone: [phone]    Collateral Contact:  Clinical/Collateral Contacts (e.g. PCP, Outpatient Psychiatric Treaters, Family)  See HPI    DSM-V    Primary Diagnosis: Schizoaffective disorder, bipolar type ([place])   Secondary Diagnosis: Post traumatic stress disorder              Case Formulation  Assessment/Formulation (including risk assessment):   Patient is a 52 year old housed, disable with diagnosis hx of PTSD, schizoaffective disorder bipolar type, past SUD, MDD and borderline personality disorder, multiple prior psychiatric hospitalizations, most recent ([facility] 07/15/22-07/20/22), hx of SI and SIB requiring medical attention. Patient self-presented to ED with worsening depression and suicidal thought with plan.     Diagnostically, patient's  emotional dysregulation leading to feeling depressed and having suicidal thoughts of wanting to blow up her oxygen tank to kill herself. Ongoing psychosocial stressor of not having services at home, adjustment to her new apartment and complex grief around murder of son is most consistent with pt.'s known PTSD.      In terms of risk, pt presents with SI with plan to blow her oxygen tank (using a lighter) to kill herself. Pt uses oxygen at home PRN and nighttime.  She has a history of multiple suicide attempts and risky SIB. Not having current OP care and poor social support. Protective factors include help-seeking behavior.     Therefore, it is this writer's opinion that the patient does meet section 12.     De-Escalation Plan:    Assess for HALTT (hunger, anger, loneliness, thirst, tiredness) and offer interventions as indicated.    Treatment Recommendations/Rationale for Recommended Level of Care:   [facility] for safety and containment, mood stabilization, medication evaluation, diagnostic clarification, substance use treatment and relapse prevention, step down to appropriate level of care, assist with [facility] application, referral to OP, coordination with family/group home/OP for discharge planning  Case discussed with [name] resident Dr.Jeffrey Lam. Please review their evaluation for final disposition, treatment recommendations, and rationale for recommended level of care    Carline Dura A. Clarabel Marion, [place]

## 2022-12-10 NOTE — ED Nursing Notes (Signed)
Report given to Jean on [facility]

## 2022-12-10 NOTE — Narrator Note (Signed)
Patient Disposition  Patient education for diagnosis, medications, activity, diet and follow-up.  Patient left ED 10:26 PM.  Patient rep received written instructions. Yes    Interpreter to provide instructions: No    Patient belongings with patient: YES    Have all existing LDAs been addressed? N/A    Have all IV infusions been stopped? N/A    Destination: Admit to:  Inpatient psych [facility] @ [facility]

## 2022-12-11 ENCOUNTER — Encounter (HOSPITAL_BASED_OUTPATIENT_CLINIC_OR_DEPARTMENT_OTHER): Payer: Self-pay | Admitting: Psychiatry

## 2022-12-11 ENCOUNTER — Other Ambulatory Visit: Payer: Self-pay

## 2022-12-11 DIAGNOSIS — K5900 Constipation, unspecified: Secondary | ICD-10-CM

## 2022-12-11 DIAGNOSIS — I5032 Chronic diastolic (congestive) heart failure: Secondary | ICD-10-CM

## 2022-12-11 DIAGNOSIS — F603 Borderline personality disorder: Secondary | ICD-10-CM | POA: Diagnosis not present

## 2022-12-11 DIAGNOSIS — R45851 Suicidal ideations: Secondary | ICD-10-CM | POA: Diagnosis not present

## 2022-12-11 DIAGNOSIS — E039 Hypothyroidism, unspecified: Secondary | ICD-10-CM

## 2022-12-11 DIAGNOSIS — G4733 Obstructive sleep apnea (adult) (pediatric): Secondary | ICD-10-CM

## 2022-12-11 DIAGNOSIS — G40909 Epilepsy, unspecified, not intractable, without status epilepticus: Secondary | ICD-10-CM

## 2022-12-11 DIAGNOSIS — R079 Chest pain, unspecified: Secondary | ICD-10-CM

## 2022-12-11 DIAGNOSIS — F250 Schizoaffective disorder, bipolar type: Secondary | ICD-10-CM | POA: Diagnosis not present

## 2022-12-11 DIAGNOSIS — F339 Major depressive disorder, recurrent, unspecified: Secondary | ICD-10-CM | POA: Diagnosis not present

## 2022-12-11 LAB — BASIC METABOLIC PANEL
CARBON DIOXIDE: 29 mmol/L (ref 21–32)
CHLORIDE: 99 mmol/L (ref 98–107)
CREATININE: 0.8 mg/dL (ref 0.4–1.2)
Glucose Random: 119 mg/dL (ref 74–160)
SODIUM: 140 mmol/L (ref 136–145)

## 2022-12-11 LAB — SERUM DRUG SCREEN
ACETAMINOPHEN: <5 ug/mL (ref 10–30)
ETHANOL: <10 mg/dL (ref 0–10)
SALICYLATE: <0.5 mg/dL (ref 3.0–20.0)

## 2022-12-11 LAB — TSH (THYROID STIMULATING HORMONE): TSH (THYROID STIM HORMONE): 1.19 u[IU]/mL (ref 0.270–4.200)

## 2022-12-11 MED ORDER — TIOTROPIUM BROMIDE MONOHYDRATE 2.5 MCG/ACT IN AERS
2.0000 | INHALATION_SPRAY | Freq: Every day | RESPIRATORY_TRACT | Status: DC
Start: 2022-12-11 — End: 2022-12-11
  Filled 2022-12-11: qty 4

## 2022-12-11 MED ORDER — POLYETHYLENE GLYCOL 3350 17 G PO PACK
17.0000 g | PACK | Freq: Every day | ORAL | Status: DC | PRN
Start: 2022-12-11 — End: 2022-12-14

## 2022-12-11 MED ORDER — ALBUTEROL SULFATE HFA 108 (90 BASE) MCG/ACT IN AERS
2.0000 | INHALATION_SPRAY | RESPIRATORY_TRACT | Status: DC | PRN
Start: 2022-12-11 — End: 2022-12-14

## 2022-12-11 MED ORDER — BUMETANIDE 1 MG PO TABS
4.0000 mg | ORAL_TABLET | Freq: Every day | ORAL | Status: DC
Start: 2022-12-11 — End: 2022-12-11
  Administered 2022-12-11: 4 mg via ORAL
  Filled 2022-12-11: qty 4

## 2022-12-11 MED ORDER — NICOTINE POLACRILEX 2 MG MT GUM
4.0000 mg | CHEWING_GUM | OROMUCOSAL | Status: DC | PRN
Start: 2022-12-11 — End: 2022-12-14
  Administered 2022-12-12 – 2022-12-14 (×7): 4 mg via ORAL
  Filled 2022-12-11 (×7): qty 2

## 2022-12-11 MED ORDER — LEVOTHYROXINE SODIUM 25 MCG PO TABS
25.0000 ug | ORAL_TABLET | Freq: Every morning | ORAL | Status: DC
Start: 2022-12-12 — End: 2022-12-14
  Administered 2022-12-12 – 2022-12-14 (×3): 25 ug via ORAL
  Filled 2022-12-11 (×3): qty 1

## 2022-12-11 MED ORDER — MELATONIN 3 MG PO TABS
3.0000 mg | ORAL_TABLET | Freq: Every evening | ORAL | Status: DC
Start: 2022-12-11 — End: 2022-12-14
  Administered 2022-12-11 – 2022-12-13 (×3): 3 mg via ORAL
  Filled 2022-12-11 (×3): qty 1

## 2022-12-11 MED ORDER — FLUTICASONE PROPIONATE 50 MCG/ACT NA SUSP
2.0000 | Freq: Two times a day (BID) | NASAL | Status: DC
Start: 2022-12-11 — End: 2022-12-11
  Filled 2022-12-11: qty 16

## 2022-12-11 MED ORDER — ATORVASTATIN CALCIUM 80 MG PO TABS
80.0000 mg | ORAL_TABLET | Freq: Every day | ORAL | Status: DC
Start: 2022-12-11 — End: 2022-12-14
  Administered 2022-12-11 – 2022-12-14 (×4): 80 mg via ORAL
  Filled 2022-12-11 (×4): qty 1

## 2022-12-11 MED ORDER — MELATONIN 3 MG PO TABS
6.0000 mg | ORAL_TABLET | Freq: Every evening | ORAL | Status: DC
Start: 2022-12-11 — End: 2022-12-11

## 2022-12-11 MED ORDER — MULTIVITAMIN PO TAB
1.0000 | ORAL_TABLET | Freq: Every day | ORAL | Status: DC
Start: 2022-12-11 — End: 2022-12-14
  Administered 2022-12-11 – 2022-12-14 (×4): 1 via ORAL
  Filled 2022-12-11 (×4): qty 1

## 2022-12-11 MED ORDER — METHOCARBAMOL 500 MG PO TABS
750.0000 mg | ORAL_TABLET | Freq: Three times a day (TID) | ORAL | Status: DC
Start: 2022-12-11 — End: 2022-12-14
  Administered 2022-12-11 – 2022-12-14 (×8): 750 mg via ORAL
  Filled 2022-12-11 (×8): qty 2

## 2022-12-11 MED ORDER — SPIRONOLACTONE 100 MG PO TABS
100.0000 mg | ORAL_TABLET | Freq: Every day | ORAL | Status: DC
Start: 2022-12-11 — End: 2022-12-14
  Administered 2022-12-11 – 2022-12-14 (×4): 100 mg via ORAL
  Filled 2022-12-11 (×4): qty 1

## 2022-12-11 MED ORDER — SPIRONOLACTONE 100 MG PO TABS
100.0000 mg | ORAL_TABLET | Freq: Every day | ORAL | Status: DC
Start: 2022-12-11 — End: 2022-12-11

## 2022-12-11 MED ORDER — BUSPIRONE HCL 10 MG PO TABS
20.0000 mg | ORAL_TABLET | Freq: Three times a day (TID) | ORAL | Status: DC
Start: 2022-12-11 — End: 2022-12-14
  Administered 2022-12-11 – 2022-12-14 (×8): 20 mg via ORAL
  Filled 2022-12-11 (×8): qty 2

## 2022-12-11 MED ORDER — BUDESONIDE-FORMOTEROL FUMARATE 160-4.5 MCG/ACT IN AERO
2.0000 | INHALATION_SPRAY | Freq: Two times a day (BID) | RESPIRATORY_TRACT | Status: DC
Start: 2022-12-11 — End: 2022-12-14
  Administered 2022-12-11 – 2022-12-13 (×4): 2 via RESPIRATORY_TRACT
  Filled 2022-12-11: qty 6

## 2022-12-11 MED ORDER — POTASSIUM CHLORIDE CRYS ER 20 MEQ PO TBCR
20.0000 meq | EXTENDED_RELEASE_TABLET | Freq: Every day | ORAL | Status: DC
Start: 2022-12-11 — End: 2022-12-14
  Administered 2022-12-11 – 2022-12-14 (×4): 20 meq via ORAL
  Filled 2022-12-11 (×4): qty 1

## 2022-12-11 MED ORDER — DICLOFENAC SODIUM 1 % EX GEL
2.0000 g | Freq: Four times a day (QID) | CUTANEOUS | Status: DC
Start: 2022-12-11 — End: 2022-12-14
  Administered 2022-12-11 – 2022-12-14 (×5): 2 g via TOPICAL
  Filled 2022-12-11: qty 50

## 2022-12-11 MED ORDER — PANTOPRAZOLE SODIUM 40 MG PO TBEC
40.0000 mg | DELAYED_RELEASE_TABLET | Freq: Every day | ORAL | Status: DC
Start: 2022-12-11 — End: 2022-12-14
  Administered 2022-12-11 – 2022-12-14 (×4): 40 mg via ORAL
  Filled 2022-12-11 (×4): qty 1

## 2022-12-11 MED ORDER — MIRTAZAPINE 7.5 MG PO TABS
7.5000 mg | ORAL_TABLET | Freq: Every evening | ORAL | Status: DC | PRN
Start: 2022-12-11 — End: 2022-12-14

## 2022-12-11 MED ORDER — THIAMINE 100 MG PO TABS
100.0000 mg | ORAL_TABLET | Freq: Every day | ORAL | Status: DC
Start: 2022-12-11 — End: 2022-12-14
  Administered 2022-12-11 – 2022-12-14 (×4): 100 mg via ORAL
  Filled 2022-12-11 (×4): qty 1

## 2022-12-11 MED ORDER — TORSEMIDE 20 MG PO TABS
80.0000 mg | ORAL_TABLET | Freq: Every day | ORAL | Status: DC
Start: 2022-12-12 — End: 2022-12-14
  Administered 2022-12-12 – 2022-12-14 (×3): 80 mg via ORAL
  Filled 2022-12-11 (×3): qty 4

## 2022-12-11 MED ORDER — NICOTINE 21 MG/24HR TD PT24
1.0000 | MEDICATED_PATCH | TRANSDERMAL | Status: DC
Start: 2022-12-11 — End: 2022-12-14
  Administered 2022-12-11 – 2022-12-13 (×3): 1 via TRANSDERMAL
  Filled 2022-12-11 (×3): qty 1

## 2022-12-11 MED ORDER — FLUOXETINE HCL 20 MG PO CAPS
40.0000 mg | ORAL_CAPSULE | Freq: Every day | ORAL | Status: DC
Start: 2022-12-11 — End: 2022-12-12
  Administered 2022-12-11 – 2022-12-12 (×2): 40 mg via ORAL
  Filled 2022-12-11 (×2): qty 2

## 2022-12-11 MED ORDER — BACLOFEN 10 MG PO TABS
10.0000 mg | ORAL_TABLET | Freq: Two times a day (BID) | ORAL | Status: DC
Start: 2022-12-11 — End: 2022-12-14
  Administered 2022-12-11 – 2022-12-14 (×6): 10 mg via ORAL
  Filled 2022-12-11 (×6): qty 1

## 2022-12-11 NOTE — Plan of Care (Signed)
Problem: Falls, Risk for  Description: REQUIRED Fall Risk:    Edmonson score of 90 or greater.  Fall Risk Score:     Humpty Dumpty score of 12 or greater.  Fall Risk Score:    Pt is at risk for a fall d/t limited mobility, uses a w/c, impulsive, impaired safety awareness, agitation, Fall risk score 66.2. Pt will remain free of fall by day 7 of admission.    Goal: Will remain free from falls  Description: Pt will remain free from falls during her hospitalization.  Outcome: Progressing   Patient was visible in the common areas , refused breakfast, ate 50% of breakfast. Took all her medications. Irritable difficult to engage, upset early in the shift, because she has not seen the doctor yet. Patient complaints of chest pain  at 08:02, VSS . MD updated : EKG and troponin level ordered. . Patient refused lab and EKG, MD updated. O2 sat 91% on R/A , O2 on at 2l via nasal canula for shortness of breath most of the shift. Patient in and out of her room propelling herself. ATivan 0.5mg  po given at 11:51am for anxiety per patient request with good effect. Patient told RN : "This doctor is not my doctor, he does not know anything about me" . Last Bm 12/10/22. Patient denies SI/HI, no safety concerns. Continue with treatment plan.
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
## 2022-12-11 NOTE — Event Note (Signed)
Event note for day team:    This writer (overnight [facility]) spoke with patient regarding placement at [facility] 2 before being discharged from ED 3/23. Discussed the following:  pt expressed dissatisfaction with [facility], and stated she wanted to sign a 3-day soon  Confirmed with patient that she does NOT have allergy to oxcarbazepine (she has chart allergy to carbamazepine)  Pt does state that Trazodone causes her hives  States she just recently started Nicotine patches; at first she states 24mg , but I explain they usually only go to 21mg , and she says she takes this.    Plan:  This Probation officer added Trazodone allergy to pt chart  Did not order insomnia med (pt states she is allergic to trazodone); day team to consider alternate med if needed  All meds ordered per [name] eval note  Placed on consistent carb diet (diabetic diet)  Day team to consider adding Nicotine 21mg  patches daily (I did not order this before the patient was transferred, so day team to place if pt desires)    Leo Grosser, MD  PGY3 Psychiatry  [phone]

## 2022-12-11 NOTE — Progress Notes (Signed)
Pt strongly refused PT. "I am not interested!". Writer asked if she could come tomorrow to check on her. "I am not interested!", and pt turned her face to her IPAD refusing to engage.     Recardo Evangelist PT  License [id number]  Pager [phone]

## 2022-12-11 NOTE — Plan of Care (Signed)
Cheryl Zimmerman arrived to the unit at 2300 from the [facility] ED. She was brought here for endorsing SI with a plan to flood her room with oxygen from her oxygen tank and then blow it up. She is well known to staff for similar past presentations. On arrival she is fairly agitated and angry, using much profanity towards all staff. Refused a beacon. States she will not talk to [place] and only partially signed in, signing in voluntarily. She denies SI/HI/AH/VH. She is on 2L O2 via NC; put on 5 minute checks for this. She initially refused a skin check, shouting/cursing at staff; however eventually relented after 1:1 time with staff. Skin intact though there is redness at R axilla, under breasts, under her abdominal folds, and has dry feet. As far as her belongings, she came in with some clothes and valuables include 2 checks, $27.10 in cash, a few bank cards, ID's, and a charlie card. Attempted to obtain her signature to send it to safe, she refused stating it will be stolen. Will store on unit until she gives [place] the go ahead; is very accusatory regarding staff stealing her stuff. The belongings search was done with this RN present. She kept shouting at staff to leave her alone, briefly swatting at staff. After admission process she was oriented to unit routine and rules and shown to bed.

## 2022-12-11 NOTE — Progress Notes (Addendum)
Received in her room in bed watching movie on a Tablet. Alert, irritable on approach. Assisted as needed w/ her needs. No SOB reported. Continued on O2 at 2 L via NC. She ate supper at bedside, 15%, but had plenty of snacks. Took all her evening medications as ordered. Independent w/ ADLs. Last BMs 12/10/22. No SI/HI, no VH/AH. Safety checks maintained. We will continue to monitor.

## 2022-12-11 NOTE — Plan of Care (Addendum)
Patient remains in bed the entire shift voiced no complaints, continues on oxygen therapy sat 91% on 2L via NC tolerated well, safety precaution maintained on 5 minute checks.slept 5.2 hours.

## 2022-12-11 NOTE — H&P (Signed)
Department of Psychiatry  Admission Physical Examination, Review of Systems, & Medical Clearance      History of Present Illness: 32F w/ PMHx HFpEF, seizure d/o, hypothyroidism, GERD, chronic pain, TUD, PTSD, and schizoaffective d/o bipolar type p/w SI, admitted [facility] on [facility].     No issues with eating or going to the bathroom. Only uses supplemental oxygen when asleep. No acute issues with breathing though thinks she may develop them due to being in the hospital and not receiving usual home medications. Thinks she had been taking torsemide 80 bid PTA. Unsure about other meds - "call my pharmacy"- [facility] No cough, fevers, nausea, or acute pain.       Past Medical History:   Past Medical History:  schizo: Bipolar affective ([place])  No date: Chronic systolic CHF (congestive heart failure) ([place])  No date: Depression  12/23/2021: Fall in home  12/23/2021: Gait instability  12/23/2021: Requires continuous at home supplemental oxygen  depress: Schizo affective schizophrenia ([place])  12/23/2021: Spinal stenosis of lumbar 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   claudication  No date: Substance abuse [facility])    Past Surgical History:   No past surgical history on file.    Medications prior to Admission:   Prior to Admission Medications   Prescriptions Last Dose Informant Patient Reported? Taking?   Budeson-Glycopyrrol-Formoterol 160-9-4.8 MCG/ACT AERO   Yes No   Sig: Inhale 2 puffs into the lungs in the morning and 2 puffs before bedtime.   EPINEPHrine (EPIPEN) 0.3 MG/0.3ML injection   Yes No   Sig: Inject 0.3 mg into the muscle daily as needed   FLUoxetine (PROZAC) 40 MG capsule   Yes No   Sig: Take 40 mg by mouth in the morning.   Melatonin 5 MG TABS Tablet   Yes No   Sig: Take 5 mg by mouth nightly   Multiple Vitamin (MULTIVITAMIN) tablet   No No   Sig: Take 1 tablet by mouth in the morning.   OTHER MEDICATION   No No   Sig: 1 each by Other route in the morning. Wheelchair: Bariatric motorized wheel chare (height 5'9",  weight 328 lb, bmi 48.4)  For use as directed.    Please fax to [facility] at [phone]  DX: Spinal stenosis of lumbar 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 claudication, Gait instability, Morbid obesity ([name])  ICD10:M48.061, R26.81, E66.01.   OXcarbazepine (TRILEPTAL) 150 MG tablet   No No   Sig: Take 1 tablet by mouth in the morning and 1 tablet before bedtime.   Potassium Chloride ER 20 MEQ TBCR   No No   Sig: Take 20 mEq by mouth in the morning.   acetaminophen (TYLENOL) 325 MG tablet   Yes No   Sig: Take 2 tablets by mouth every 6 (six) hours as needed for Pain   atorvastatin (LIPITOR) 80 MG tablet   No No   Sig: Take 1 tablet by mouth in the morning.   baclofen (LIORESAL) 10 MG tablet   Yes No   Sig: Take 1 tablet by mouth in the morning and 1 tablet before bedtime.   bumetanide (BUMEX) 1 MG tablet   No No   Sig: Take 4 tablets by mouth in the morning.   buprenorphine-naloxone (SUBOXONE) 8-2 MG sublingual tablet   No No   Sig: Place 1 tablet under the tongue in the morning and 1 tablet at noon and 1 tablet before bedtime. Max Daily Amount: 3 tablets. Do all this for 7 days.  busPIRone (BUSPAR) 10 MG tablet   Yes No   Sig: Take 2 tablets by mouth in the morning and 2 tablets at noon and 2 tablets before bedtime.   diclofenac (VOLTAREN) 1 % GEL Gel   Yes No   Sig: Apply 2 g topically in the morning and 2 g at noon and 2 g in the evening and 2 g before bedtime.   fluticasone (FLONASE) 50 MCG/ACT nasal spray   Yes No   Sig: 2 sprays by Each Nostril route in the morning and 2 sprays before bedtime.   gabapentin (NEURONTIN) 300 MG capsule   No No   Sig: Take 2 capsules by mouth in the morning and 2 capsules at noon and 2 capsules before bedtime.   gabapentin (NEURONTIN) 600 MG tablet   No No   Sig: Take 1 tablet by mouth in the morning and 1 tablet before bedtime.   hydrOXYzine (ATARAX) 25 MG tablet   Yes No   Sig: Take 25 mg by mouth every 6 (six) hours as needed for Anxiety   levETIRAcetam (KEPPRA) 750 MG tablet   No No   Sig:  Take 1 tablet by mouth in the morning and 1 tablet before bedtime.   levothyroxine (SYNTHROID) 25 MCG tablet   No No   Sig: Take 1 tablet by mouth every morning before breakfast   methocarbamol (ROBAXIN) 500 MG tablet   No No   Sig: Take 1.5 tablets by mouth in the morning and 1.5 tablets at noon and 1.5 tablets before bedtime.   nicotine (NICODERM CQ) 14 MG/24HR   No No   Sig: Place 1 patch onto the skin in the morning for 14 days.   nicotine (NICODERM CQ) 21 MG/24HR   No No   Sig: Place 1 patch onto the skin in the morning.   nicotine (NICODERM CQ) 7 MG/24HR   No No   Sig: Place 1 patch onto the skin in the morning for 14 days.   nicotine polacrilex (NICORETTE) 4 MG gum   No No   Sig: Take 1 each by mouth as needed for Craving (Nicotine)   pantoprazole (PROTONIX) 40 MG tablet   Yes No   Sig: Take 1 tablet by mouth in the morning.   polyethylene glycol (GLYCOLAX/MIRALAX) 17 g packet   No No   Sig: Take 1 packet by mouth in the morning.   prazosin (MINIPRESS) 2 MG capsule   Yes No   Sig: Take 1 capsule by mouth nightly   sennosides (SENOKOT) 8.6 MG tablet   Yes No   Sig: Take 2 tablets by mouth at bedtime   spironolactone (ALDACTONE) 100 MG tablet   No No   Sig: Take 1 tablet by mouth in the morning.   thiamine (VITAMIN B-1) 100 mg tablet   Yes No   Sig: Take 100 mg by mouth in the morning.   tiotropium (SPIRIVA RESPIMAT) 2.5 MCG/ACT aerosol solution   Yes No   Sig: Inhale 2 puffs into the lungs in the morning.   tiotropium (SPIRIVA) 18 MCG inhalation capsule   Yes No   Sig: Inhale 1 capsule into the lungs in the morning.   traZODone (DESYREL) 50 MG tablet   Yes No   Sig: Take 1 tablet by mouth nightly as needed for Sleep      Facility-Administered Medications: None        Allergies:   Review of Patient's Allergies indicates:   Bee venom  Anaphylaxis   Metolazone              Other (See Comments)    Comment:Hypokalemia   Trazodone               Hives   Methylprednisolone      Dizziness, Drowsiness     Comment:Also believes syncope. Seems to tolerate             inhalational and epidural steroid.   Nutritional supplem*       Tegretol [carbamaze*    Hives   Hydroxyzine             Nausea Only    Social History:  Tobacco Use: no   Alcohol: no    Family History:   Mother and father have no hx of CAD.    Review of Systems: No fevers, SOB, CP, abd pain, n/v/d, no focal weakness.   All other organ systems are reviewed and are negative.    Physical Exam:  Vital Signs - Last 8 Hours:   BP: (134)/(82)   Temp: --  Pulse:  [66]   Resp:  [18]   SpO2:  [93 %]     GEN: NAD, sitting in bed  EENT: EOMI, clear sclera  Lungs: CTAB (per ED exam), respirations unlabored- return for auscultation  Heart: RRR (per ED)  Abd: Soft, non-tender, non-distended (per ED)  Neuro: No focal deficits, alert  Skin: warm, dry  Extremities: no edema (per ED)- return to eval more closely  Psych: irritable           Recent Labs:  BMP:   Recent Labs     12/10/22  1135   NA 140   K 4.0   CL 99   CO2 29   BUN 12   CREAT 0.8   GLUCOSER 119     Ca, Mg, Phos:   Recent Labs     12/10/22  1135   CA 9.6     CBC:   Recent Labs     12/10/22  1135   WBC 10.1   HCT 46.7*   PLTA 333   RBC 5.05     Coagulation Labs: No results for input(s): "PT", "PTT", "INR" in the last 72 hours.  LFTs: No results for input(s): "AST", "ALT", "TBILI", "DBILI", "ALKPHOS" in the last 72 hours.  Pancreatic Enzymes: No results for input(s): "AMY", "LIP" in the last 72 hours.  Urinalysis:No results for input(s): "UACOL", "UACLA", "UAGLU", "UABIL", "UAKET", "SPEGRAVURINE", "UAOCC", "UAPH", "UAPRO", "UANIT", "LEUKOCYTES" in the last 72 hours.    Invalid input(s): "UAOBU"  Troponin: No results for input(s): "TROPI" in the last 72 hours.        Assessment and Plan  55F w/ PMHx HFpEF, seizure d/o, hypothyroidism, GERD, chronic pain, TUD, PTSD, and schizoaffective d/o bipolar type p/w SI, admitted [facility] on 3/[facility].     HFpEF. No exacerbation. SpO2 93% on room air. Most recent EF ~70%. Appears  euvolemic. C/w bumex and spironolactone.   -her diuretic regimen has been unclear in past. Per recent outpt notes, appears should be on bumex 4 qd + spironolactone 100 qd.   -[facility] most [facility] Rxs were from July 2023 which she did not fill (torsemide 100 qd + spironolactone 100 qd). [facility] closed.   -if she has poor urine output/ hypoxia while awake on room air, or any sign of accumulation of fluid; would switch her bumex to torsemide  80 qd vs bid (she has tolerated up to torsemide 120 bid in past) (if loop diuretic is increased, would monitor BMP at least every 2 days until K+ is stable as she will likely need K+ supplementation daily- has been most recently on Kcl 20 mEq qd)-->would favor at least switching bumex to torsemide 80 qd and consider increasing it to bid pending repeat bmp in 1-2 days and clinical response (ie uop, spo2, BP) (pt reports taking torsemide 80 bid as most recent dose)  -of note, bumex 4mg  = lasix 160 mg = torsemide 40 vs 80 mg (varies depending on source of data) (data our pharmacy uses suggests bumex 4mg  = torsemide 40 mg)  -she should have SpO2 assessed on room air while sitting upright as she could likely have mild desat due to body habitus / obesity hypoventilation syndrome if laying in bed, unable to take deep breaths)    OSA.  -reports not on supplemental O2 during the day. Only uses it at night or while asleep.     No COPD or asthma.  -of note, appears PFTs in 2022 not suggestive of obstruction and no response to bronchodilator-->not consistent w/ COPD or asthma    Seizure d/o. C/w keppra and trileptal.     Hypothyroidism. C/w levothyroxine. F/u TSH.     For pain, appears home regimen has consisted of gabapentin, suboxone, diclofenac gel, methocarbamol, tylenol. Here on gabapentin, suboxone, and tylenol. Will defer rest of meds to psych.     H/o chest pain. Seems to be chronic / recurrent complaint. Most recent evals include reassuring  troponins, EKGs, echocardiograms, and 2023 normal stress test. Would tx as part of her chronic pain    H/o constipation. Miralax prn. Consider adding senna. Titrate and consider switching miralax to lactulose.      The patient has been examined, and the chart has been reviewed,  with findings as documented above.  The patient is medically stable and cleared for admission.      Linford Arnold, PA-C, 12/11/2022

## 2022-12-12 DIAGNOSIS — F603 Borderline personality disorder: Secondary | ICD-10-CM | POA: Diagnosis not present

## 2022-12-12 DIAGNOSIS — R45851 Suicidal ideations: Secondary | ICD-10-CM | POA: Diagnosis not present

## 2022-12-12 DIAGNOSIS — F250 Schizoaffective disorder, bipolar type: Secondary | ICD-10-CM | POA: Diagnosis not present

## 2022-12-12 DIAGNOSIS — F339 Major depressive disorder, recurrent, unspecified: Secondary | ICD-10-CM | POA: Diagnosis not present

## 2022-12-12 LAB — EKG

## 2022-12-12 MED ORDER — FLUOXETINE HCL 20 MG PO CAPS
60.0000 mg | ORAL_CAPSULE | Freq: Every day | ORAL | Status: DC
Start: 2022-12-13 — End: 2022-12-14
  Administered 2022-12-13 – 2022-12-14 (×2): 60 mg via ORAL
  Filled 2022-12-12 (×2): qty 3

## 2022-12-12 MED ORDER — FLUOXETINE HCL 10 MG PO TABS
20.00 mg | ORAL_TABLET | Freq: Every day | ORAL | Status: AC
Start: 2022-12-12 — End: 2022-12-12
  Administered 2022-12-12: 20 mg via ORAL
  Filled 2022-12-12: qty 2

## 2022-12-12 NOTE — Progress Notes (Signed)
Received in the day area siting in her w/c socializing w/ selected peers. Alert, less irritable this shift. VS stable. No acute pain reported. Continued on O2 at 2 L via NC. No respiratory distress. She attended music and meal groups. She ate 100% of supper. Positive medications, including Nicorette Gum 4 mg PRN for Nicotine Craving. Independent w/ ADLs. Last BMs 12/10/22. No SI/HI, no VH/AH. No unsafe behaviors noted. We will continue to monitor.

## 2022-12-12 NOTE — Group Note (Signed)
OT Psych Group Documentation    Topics: Project    Occupations Addressed: Leisure, Social participation   Skills addressed: Fine/gross IT trainer, process skills, social interaction skills, behavioral control, mental functions     Date: 12/12/2022  Start Time: 1100  End Time: [id number]    Comments: in room, awake, given sensory items, seeking new table, was invited but did not join    Bari Mantis, OT  lic# [id number]

## 2022-12-12 NOTE — Progress Notes (Signed)
GERIATRIC PSYCHIATRY PROGRESS NOTE    IDENTIFYING DATA   Patient is a 52 year old female who was admitted on 12/10/22   with Depressive disorder [F32.A] for reporting SI with plan.     LEGAL STATUS   Conditional Voluntary (section 10/11), 3-day letter signed expires 3/27    [facility] Day: 3  The patient was seen, chart reviewed, discussed in multidisciplinary team     INTERVAL EVENTS  Admitted over the weekend    ACTIVITIES OF DAILY LIVING  Meals       Date/Time Percent Meals Eaten (%)    12/11/22 1400 --    12/12/22 0930 65 %    12/12/22 1230 100 %          ELIMINATION:  Last BM Date: 12/10/22  Toileting: Moderate assistance  Urinary incontinence: No  Fecal incontinence: No    SLEEP: 5.6 hours    GROUPS:  none so far     MEDS compliant    PRN:  nicotine gum       On interview, patient is pleasant and overly familiar, calling TW "honey" multiple times. She states she doesn't need to be here, that she simply came to [place] for "some depression" and to "adjust her depression meds". She states she would like to increase her zoloft (which is not a medication she is currently taking). She does admit that she made statements about blowing up her oxygen tanks but she says those were "nightmares" she had that she was afraid of and adamantly denies SI or wish to be dead. She states she arranged for transportation to bring her from [place] all the way here because she preferred to come to [place]. She reports she has been in housing at [facility] for the past 3 months and things are going well there, she has decorated her place to her liking.     She states she fired her VNA due to believing that her nurse had not reached out to her doctor in a timely way to renew prescriptions. She states she has a home health aide that is supposed to come this week for an assessment and also another appointment today that she is missing. She states [facility] [place] so  she is set with her medications. She states her psych prescriber is being transitioned from [place] elsewhere.     She states she would like to leave tomorrow, and has signed a three day letter which will expire Wednesday. She states she would like an increase in her depression medication.       PHYSICAL/SOMATIC COMPLAINTS  The patient voices: Does not verbalize complaints   On general review, endorses no general malaise, no fever/chills, no SOB/CP. No report of dizziness/weakness/numbness. No changes to bowel or bladder habits.  No generalized or localized pain. Cranial nerves grossly intact.    See Interval Hx, assessment and plan for any other details not specified here.    ALLERGIES  Bee Venom, Metolazone, Trazodone, Methylprednisolone, Nutritional Supplements, Tegretol [Carbamazepine], and Hydroxyzine    SCHEDULED MEDS   [START ON 12/13/2022] FLUoxetine  60 mg Oral Daily    atorvastatin  80 mg Oral Daily    baclofen  10 mg Oral BID    busPIRone  20 mg Oral TID    diclofenac  2 g Topical 4x Daily    levothyroxine  25 mcg Oral DAILY    methocarbamol  750 mg Oral TID  nicotine  1 patch Transdermal Q24H    pantoprazole  40 mg Oral Daily    spironolactone  100 mg Oral Daily    thiamine  100 mg Oral Daily    potassium chloride  20 mEq Oral Daily    melatonin  3 mg Oral Nightly    budesonide-formoterol  2 puff Inhalation BID    multivitamin  1 tablet Oral Daily    torsemide  80 mg Oral Daily    buprenorphine-naloxone  8 mg Sublingual TID    OXcarbazepine  150 mg Oral BID    levETIRAcetam  750 mg Oral BID    gabapentin  600 mg Oral BID    prazosin  2 mg Oral Nightly     PRN MEDS  Current Facility-Administered Medications   Medication Dose Route Frequency Last Admin    mirtazapine  7.5 mg Oral Nightly PRN      nicotine polacrilex  4 mg Oral Q4H PRN 4 mg at 12/12/22 1638    polyethylene glycol  17 g Oral Daily PRN      albuterol HFA  2 puff Inhalation Q4H PRN      acetaminophen  650 mg Oral Q4H PRN       aluminum-magnesium hydroxide-simethicone  30 mL Oral Q2H PRN      OLANZapine zydis  2.5 mg Oral Q6H PRN          VITALS   12/12/22  0749 12/12/22  0755 12/12/22  1312 12/12/22  1620   BP:  125/75  101/64   Pulse:  77 68 74   Resp: 18 18 18 16    Temp:    97.1 F (36.2 C)   TempSrc:    Temporal   SpO2:  93% 93% 90%   Weight:       Height:           RECENT LABS  Chemistries  Recent Labs     12/10/22  1135   NA 140   K 4.0   CO2 29   CL 99   BUN 12   CREAT 0.8   CA 9.6   ANION 12   GFR > 60    GI  No results for input(s): "AST", "ALT", "TBILI", "DBILI", "IBIL", "ALKPHOS", "GGTP", "ALBUMIN", "AMY", "LIP" in the last 72 hours.    CBC  Recent Labs     12/10/22  1135   WBC 10.1   HGB 15.0   HCT 46.7*   PLTA 333    Coags  No results for input(s): "INR", "PT", "APTT", "HPTT", "FIB" in the last 72 hours.   Trops, BNP, D-dimer, Lactate, C-RP, CK  No results for input(s): "TROPI", "TROPTHS", "TROPTHSDELTA", "PROBNP", "DDPEDVT", "DD", "LACTICACID", "CRP", "CKTOTAL" in the last 72 hours. Urinalysis  No results for input(s): "UACOL", "UACLA", "UAGLU", "UABIL", "UAKET", "SPEGRAVURINE", "UAOCC", "UAPH", "UAPRO", "UABAC", "UARBC", "UAWBC", "UANIT", "LEUKOCYTES", "UAMIC", "UASQE" in the last 72 hours.   Endocrine (cross encounter)  Lab Results   Component Value Date    TSH 1.190 12/10/2022    HGBA1C 6.5 (H) 07/15/2022    CHOLESTEROL 263 (H) 04/10/2022    HDL 40 04/10/2022    LDL 192 (H) 04/10/2022    TG 301 (H) 04/10/2022    B12 287 12/22/2021    FOL 19.4 12/22/2021     Finger Sticks (x6)  No results for input(s): "FINGERSTICKR" in the last 72 hours. Psych Serum Levels  Psychiatric Drug Serum Levels (45d)    No lab values to display.  METABOLIC MONITORING  HEMOGLOBIN A1C (%)   Date Value   07/15/2022 6.5 (H)   12/03/2021 6.0 (H)     HEMOGLOBIN A1C CARE EVERYWHERE (%)   Date Value   08/26/2021 5.5   06/23/2020 5.8 (H)   03/04/2020 5.4       No results found for: "POCA1C"    Cholesterol (mg/dL)   Date Value   04/10/2022 263 (H)    12/03/2021 187     LOW DENSITY LIPOPROTEIN DIRECT (mg/dL)   Date Value   04/10/2022 192 (H)   12/03/2021 111     HIGH DENSITY LIPOPROTEIN (mg/dL)   Date Value   04/10/2022 40   12/03/2021 39 (L)     TRIGLYCERIDES (mg/dL)   Date Value   04/10/2022 301 (H)   12/03/2021 288 (H)         IMAGING  No imaging results were found within the past 3 days.      EKG  Completed 12/11/22    CSSRS  Default Flowsheet Data (most recent)       C-SSRS Frequent Screener - 12/12/22 1623          C-SSRS Frequent Screener    Since last contact, have you had thoughts about killing yourself? No     Have you been thinking about how you might do this?  No     Have you had these thoughts and had some intention of acting on them? No     Have you started to work out or worked out the details of how to kill yourself? Did you intend to carry out this plan? No     Have you done anything, started to do anything, or prepared to do anything to end your life?  No     C-SSRS Frequent Screener Risk Score No Risk                   MENTAL STATUS EXAM  General Appearance: Clean, Dressed appropriately  Behavior: Cooperative  Level of Consciousness: Alert  Orientation Level: Oriented to person, Oriented to situation  Attention/Concentration: WNL  Mannerisms/Movements: No abnormal mannerisms/movements  Speech Quality and Rate: WNL  Speech Clarity: Clear  Speech Tone: Normal vocal inflection  Vocabulary/Fund of Knowledge: WNL  Memory: Grossly intact  Thought Process & Associations: Goal-directed  Dissociative Symptoms: None  Thought Content: No abnormalities reported or observed  Delusions: None  Hallucinations: None  Suicidal Thoughts: None  Homicidal Thoughts: None  Mood: Euthymic  Mood comment: "fine"  Affect: Congruent with mood  Judgment: Poor  Insight: Poor    DSM-5 DIAGNOSIS  DSM-V    Primary Diagnosis: Episode of recurrent major depressive disorder ([name])   Secondary Diagnosis: Borderline personality disorder ([place])          FORMULATION/ASSESSMENT    Karenza Elbon is a 52yo woman with schizoaffective d/o bipolar type, PTSD, h/ past SUD (etoh, cocaine), medical history of CHF, seizure d/o, hypothyroidism, COPD, GERD, chronic pain, spinal stenosis, who presents with SI and stated plan. She has retracted these statements multiple times since being admitted, now claiming that she had nightmares about doing that and denies any actual suicidality or wish to die. She states she is depressed and needs medication adjustment,  but she presents with incongruent, bright and cheerful affect and simultaneously states that everything is going well and she is fine. In light of this, believe that her statements were made in a moment of acute distress following upset/firing her VNA and lacking access to  suboxone for two days at home - which allowed her to access treatment and care in the hospital - most consistent with affective lability related to her character pathology. There does not appear to be a serious change in her existing depressive illness, and she does not appear to have genuine intent or desire to die at this time. She has signed a 3-day letter which expires 3/27.     Need to contact [facility] care partner to discuss a way for her to obtain her medications while home - which is in a lockbox. Will reach out to PCP to discuss this conundrum as well.     PLAN   Psychiatric  -increase fluoxetine to 60mg  daily  -buspirone 20mg  TID  -prazosin 2mg  nightly  -melatonin 3mg  nightly  -PRN olanzapine 2.5mg  q6hr for agitation  -PRN mirtazapine 7.5mg  nightly for sleep   Metabolic monitoring for patients on antipsychotics: lipid panel 04/10/22 cholesterol 263, triglycerides 301, HDL 40, LDL 192. HbA1C (07/15/22) 6.5     Seizure d/o             -keppra 750mg  BID             -oxcarbazepine 150mg  BID     Substance use d/o             -suboxone 8mg  TID     Neuropathic pain              -gabapentin 600mg  BID     Cognitive:  No current concerns      Medical:  Cardiovascular: atorvastatin  80mg  daily, spironolactone 100mg  daily, torsemide 80mg  daily   COPD: budesonide-formoterol 2 puffs BID, albuterol inhaler PRN  Nicotine replacement: nicotine gum PRN, nicotine patch daily   Back spasm: baclofen 10mg  BID, voltaren gel, methocarbamol 750mg  TID  GERD: pantoprazole 40mg  daily  Supplement: thiamine 100mg  daily, potassium chloride 10mEq daily, multivitamin daily   Hypothyroid: levothyroxine 56mcg daily     Bowel regimen ppx: PRN milk of mag, PRN miralax   Pain ppx: PRN tylenol, rest as above   DVT ppx: Patient is ambulatory  Diet: Diet - Base Diet: Modified/Dysphagia/Restricted; Diabetes Restrictions: Consistent Carbohydrate-Moderate (4 CHO/60g per meal)     Legal/safety  CV, 3-day letter signed expires 3/27  HCP: None documented   Full Code  Checks Interval: 15 min  Medical bed    Dispo        -home pending clinical stability     Review with patient: Treatment plan reviewed with the patient.    I have spent 55 minutes face-to-face with patient and/or floor/unit time of which over 50% of time was spent with counseling, education and coordination of care.

## 2022-12-12 NOTE — Group Note (Signed)
OT Psych Group Documentation    Topics: Feel Good      Occupations Addressed: Activities of Daily Living (Functional Mobility), Health management ( Physical Activity, Social and emotional health promotion and maintenance), Social Participation (Peer group participation, Friendships), Leisure (Leisure exploration and participation).     Performance skills and Functions: Gross IT trainer, social interaction skills, process skills, Gesticulates, Touches, Positions, Bends, Manipulates, Paces, Inquires, Navigates.     Description: Group members are engaged and assisted through various activities, including safely navigating in a space with furniture and peers, muscle strengthening through exercises or games, and memory games to promote cognitive functioning. This group promotes self esteem and personal autonomy.                 Date: 12/12/2022  Start Time: 1000  End Time: [id number]    Attendance: Declined    Dava Rensch [place], OTA  lic# [id number]

## 2022-12-12 NOTE — Progress Notes (Signed)
12/12/22 0920   Language Information   Language Needs Met By: Cleophus Molt Used Effectively By Patient   Rehab Discipline   Rehab Discipline PT   General   Missed Treatment Reason Patient declined treatment     Approached pt who was in sitting in her w/c in her room. Pt stated she signed a 3 day notice and is eager to get back home to finish decorating her new apartment. When asked if she would like to participate in skilled PT, pt stated, "I don't walk, I use this wheelchair, I am fine, I don't need PT." Pt declining PT at this time.     Crisoforo [place], [place], [place] # [id number]
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
## 2022-12-12 NOTE — Plan of Care (Signed)
OT note: Individualized Active Treatment Session Note: Psycho-educational Materials  S: "I have done CBT and DBT, it was helpful, I still use their principles, I have gone to a PHP"     O: Pt was receptive to psycho-educational materials offered by OT to assist with self awareness and self soothing/coping.  Pt was given information on the following: Simple Mindfulness Techniques    A: Pt receptive and appreciative of materials    P: Continue to offer pt support. Review with pt materials already provided and see if any additional materials might be useful or indicated at this time.

## 2022-12-12 NOTE — Progress Notes (Addendum)
Met with pt regarding her admission to [facility] where she is known from multiple past admissions.  Pt is a 52 year old with a history of Schizoaffective Disorder, obesity, COPD requiring supplemental O2, GERD, Hypothyroidism and chronic pain.   Pt presented to the [facility] with suicidal ideation.   However, today pt is denying suicidal ideation and is eager to leave the hospital on Wednesday.  On interview with this Probation officer, pt was quite dismissive and stated clearly, "I do not need any help."  Pt confirmed she is now living in [place] in an apartment.  Pt said, "it was a mistake to come here."   Pt confirmed she has a PCP at [facility], Cora Collum, MD but says she does not need an appointment and is all set with her prescriptions.  Pt said that her insurance company, [employer], knows she is in the hospital and there is no reason to contact them.  Pt said, "I just need to go."      When pt was last on this unit, in October, 2023, she was living in [place] facility but she has since transitioned back to the community and is living independently.       Collateral  Arletha Pili, Tedra Coupe, Team Leader, [place], [place]  [facility], Barnett Abu, Discharge Liaison

## 2022-12-12 NOTE — Plan of Care (Signed)
Patient alert and responsive, received in bed resting in no acute distress, irritable on approach, labile, continues on oxygen therapy sat at 94% on 2 L via NC, voiced no complaints, ambulated to the bathroom on her own to void, no BM, + med, po intake encouraged, slept 5.6 hrs, will continue with plan of care.

## 2022-12-12 NOTE — Group Note (Signed)
OT Psych Group Documentation    Topic: Breakfast Bites      Occupations Addressed: Activities of Daily Living, Social participation, Health Management (Feeding, Functional Mobility, Nutrition Management, Peer Group Participation)   Performance Skills and Functions: Expressing needs, Choosing, pacing, inquiring, organizing, positioning, reaching, navigating, regulating, starting and concluding social interactions  Description: Group participants engage and are assisted during their morning meal time, which is both a necessary self care task as well as a meaningful social activity. This may include direct assistance with feeding, completing a menu for future meals, and promoting appropriate behavioral control, and social interaction.                 Date: 12/12/2022  Start Time: 0745  End Time: 0845    Attendance: Few minutes      Comments: Briefly appeared in the DR  stay by the nursing station to make her menu.     Jaela Yepez Jean-Gilles, OTA  lic# [id number]

## 2022-12-12 NOTE — Plan of Care (Signed)
OT Individualized Active Treatment Session Note:  Orientation to Sensory Cart  S:    "I'll take a stress ball"     O:    Pt was oriented to the [employer] items The purpose of these sensory resources was explained to the patient and pt selected a smiley stress ball, a notebook, and a pineapple pop-it at this time. Pt was informed to seek out any staff member if needs any additional sensory items throughout this hospital stay to assist with self-regulation, arousal, or participation in activities. Pt also encouraged her roommate to take some items     A:    Pt was interactive, supportive with peer; and was receptive to this concept  appeared to understand the purpose and benefits of various sensory resources, and how to access them on the unit.      P:    Continue to offer pt support and techniques to assist with self-regulation, arousal, and participation. Encourage pt to utilize items that pt found effective. Continue to work on above stated skills in individual sessions as needed, and in groups as indicated by OT plan of care. Provide any other supplies/material that may assist pt with individualized goals.

## 2022-12-12 NOTE — Plan of Care (Signed)
Pt has been visible in the common areas at intervals,sitting in the W/C self-propelled, easily frustrated, irritable yelling out when her needs are not met immediately.No unsafe behavior was exhibited. Denies SI HI AH VH.Took meds as scheduled except Voltaren ointment refused. Requested nicorette gums for craving. VSS, continues on 02 VIA nasal cannula 2/L titrate to keep 02 SAT > 92 Assisted with care. She ate 65% of breakfast and lunch 100% Pt attended the group. Will continue to assess pt and monitor for safety. Last [facility] 3/23

## 2022-12-13 ENCOUNTER — Encounter (HOSPITAL_BASED_OUTPATIENT_CLINIC_OR_DEPARTMENT_OTHER): Payer: Self-pay

## 2022-12-13 DIAGNOSIS — F339 Major depressive disorder, recurrent, unspecified: Secondary | ICD-10-CM | POA: Diagnosis not present

## 2022-12-13 DIAGNOSIS — F250 Schizoaffective disorder, bipolar type: Secondary | ICD-10-CM | POA: Diagnosis not present

## 2022-12-13 DIAGNOSIS — F603 Borderline personality disorder: Secondary | ICD-10-CM | POA: Diagnosis not present

## 2022-12-13 DIAGNOSIS — R45851 Suicidal ideations: Secondary | ICD-10-CM | POA: Diagnosis not present

## 2022-12-13 MED ORDER — FLUOXETINE HCL 40 MG PO CAPS
40.0000 mg | ORAL_CAPSULE | Freq: Every day | ORAL | 0 refills | Status: DC
Start: 2022-12-13 — End: 2023-06-02

## 2022-12-13 MED ORDER — FLUOXETINE HCL 20 MG PO CAPS
20.0000 mg | ORAL_CAPSULE | Freq: Every day | ORAL | 0 refills | Status: DC
Start: 2022-12-14 — End: 2023-06-02

## 2022-12-13 NOTE — Group Note (Signed)
OT Psych Group Documentation    Topics: Feel Good      Occupations Addressed: Activities of Daily Living (Functional Mobility), Health management ( Physical Activity, Social and emotional health promotion and maintenance), Social Participation (Peer group participation, Friendships), Leisure (Leisure exploration and participation).     Performance skills and Functions: Gross IT trainer, social interaction skills, process skills, Gesticulates, Touches, Positions, Bends, Manipulates, Paces, Inquires, Navigates.     Description: Group members are engaged and assisted through various activities, including safely navigating in a space with furniture and peers, muscle strengthening through exercises or games (family feud and social trivia), and memory games to promote cognitive functioning. This group promotes self esteem and personal autonomy.                 Date: 12/13/2022  Start Time: 1015  End Time: 1100    Attendance: Declined    Briceyda Abdullah Jean-Gilles, OTA  lic# [id number]

## 2022-12-13 NOTE — Progress Notes (Signed)
GERIATRIC PSYCHIATRY PROGRESS NOTE    IDENTIFYING DATA   Patient is a 52 year old female who was admitted on 12/10/22   with Depressive disorder [F32.A]  (primary encounter diagnosis)  Depressive disorder for reporting SI with plan.     LEGAL STATUS   Conditional Voluntary (section 10/11), 3-day letter signed expires 3/27    [facility] Day: 4  The patient was seen, chart reviewed, discussed in multidisciplinary team     INTERVAL EVENTS  On evenings socialized with select peers. Less irritable last evening. Continues on O2. Attended music and meal groups, took medications and PRN nicotine gum. Independent with ADLs. Overnight alert and responsive, no distress, irritable on approach and refused to wear NC oxygen. Sat 93% RA. Slept 4.7hrs.     ACTIVITIES OF DAILY LIVING  Meals       Date/Time Percent Meals Eaten (%)    12/12/22 0930 65 %    12/12/22 1230 100 %    12/12/22 1736 100 %          ELIMINATION:  Last BM Date: 12/12/22  Toileting: Moderate assistance  Urinary incontinence: No  Fecal incontinence: No    SLEEP: 4.7 hours    GROUPS:  none so far     MEDS compliant    PRN:  nicotine gum       On interview, patient continues to watch tv loudly in room on tablet, similar to prior presentations. She reports she is very good and doesn't know why she is here. She understands her 3-day letter expires tomorrow and she expresses eagerness to return home. She states that while she does not have VNA, she can still manage her own medications which "my doctor is okay with". She states she has an Environmental education officer with 3x daily pill  boxes and she understands how to take all her medications including suboxone. She states her plans to return home and call her PCP office to f/u on a VNA referral, because she thinks it remains a good idea in the long run to have one.    She confirms her pharmacy is [facility] on [address]. She denies suicidality today. She would like to leave the hospital tomorrow morning.       PHYSICAL/SOMATIC  COMPLAINTS  The patient voices: Does not verbalize complaints   On general review, endorses no general malaise, no fever/chills, no SOB/CP. No report of dizziness/weakness/numbness. No changes to bowel or bladder habits.  No generalized or localized pain. Cranial nerves grossly intact.    See Interval Hx, assessment and plan for any other details not specified here.    ALLERGIES  Bee Venom, Metolazone, Trazodone, Methylprednisolone, Nutritional Supplements, Tegretol [Carbamazepine], and Hydroxyzine    SCHEDULED MEDS   FLUoxetine  60 mg Oral Daily    atorvastatin  80 mg Oral Daily    baclofen  10 mg Oral BID    busPIRone  20 mg Oral TID    diclofenac  2 g Topical 4x Daily    levothyroxine  25 mcg Oral DAILY    methocarbamol  750 mg Oral TID    nicotine  1 patch Transdermal Q24H    pantoprazole  40 mg Oral Daily    spironolactone  100 mg Oral Daily    thiamine  100 mg Oral Daily    potassium chloride  20 mEq Oral Daily    melatonin  3 mg Oral Nightly    budesonide-formoterol  2 puff Inhalation BID    multivitamin  1 tablet Oral Daily    torsemide  80 mg Oral Daily    buprenorphine-naloxone  8 mg Sublingual TID    OXcarbazepine  150 mg Oral BID    levETIRAcetam  750 mg Oral BID    gabapentin  600 mg Oral BID    prazosin  2 mg Oral Nightly     PRN MEDS  Current Facility-Administered Medications   Medication Dose Route Frequency Last Admin    mirtazapine  7.5 mg Oral Nightly PRN      nicotine polacrilex  4 mg Oral Q4H PRN 4 mg at 12/13/22 1333    polyethylene glycol  17 g Oral Daily PRN      albuterol HFA  2 puff Inhalation Q4H PRN      acetaminophen  650 mg Oral Q4H PRN      aluminum-magnesium hydroxide-simethicone  30 mL Oral Q2H PRN      OLANZapine zydis  2.5 mg Oral Q6H PRN          VITALS   12/13/22  0549 12/13/22  0734 12/13/22  0756 12/13/22  1322   BP:  108/73 108/73    Pulse: 71 77     Resp:  18 18 18    Temp:  97.8 F (36.6 C)     TempSrc:       SpO2: 93% 92%     Weight:       Height:           RECENT  LABS  Chemistries  No results for input(s): "NA", "K", "CO2", "CL", "BUN", "CREAT", "GLUCOSE", "CA", "MG", "PHOS", "ANION", "GFR" in the last 72 hours. GI  No results for input(s): "AST", "ALT", "TBILI", "DBILI", "IBIL", "ALKPHOS", "GGTP", "ALBUMIN", "AMY", "LIP" in the last 72 hours.    CBC  No results for input(s): "WBC", "HGB", "HCT", "PLTA" in the last 72 hours. Coags  No results for input(s): "INR", "PT", "APTT", "HPTT", "FIB" in the last 72 hours.   Trops, BNP, D-dimer, Lactate, C-RP, CK  No results for input(s): "TROPI", "TROPTHS", "TROPTHSDELTA", "PROBNP", "DDPEDVT", "DD", "LACTICACID", "CRP", "CKTOTAL" in the last 72 hours. Urinalysis  No results for input(s): "UACOL", "UACLA", "UAGLU", "UABIL", "UAKET", "SPEGRAVURINE", "UAOCC", "UAPH", "UAPRO", "UABAC", "UARBC", "UAWBC", "UANIT", "LEUKOCYTES", "UAMIC", "UASQE" in the last 72 hours.   Endocrine (cross encounter)  Lab Results   Component Value Date    TSH 1.190 12/10/2022    HGBA1C 6.5 (H) 07/15/2022    CHOLESTEROL 263 (H) 04/10/2022    HDL 40 04/10/2022    LDL 192 (H) 04/10/2022    TG 301 (H) 04/10/2022    B12 287 12/22/2021    FOL 19.4 12/22/2021     Finger Sticks (x6)  No results for input(s): "FINGERSTICKR" in the last 72 hours. Psych Serum Levels  Psychiatric Drug Serum Levels (45d)    No lab values to display.          METABOLIC MONITORING  HEMOGLOBIN A1C (%)   Date Value   07/15/2022 6.5 (H)   12/03/2021 6.0 (H)     HEMOGLOBIN A1C CARE EVERYWHERE (%)   Date Value   08/26/2021 5.5   06/23/2020 5.8 (H)   03/04/2020 5.4       No results found for: "POCA1C"    Cholesterol (mg/dL)   Date Value   04/10/2022 263 (H)   12/03/2021 187     LOW DENSITY LIPOPROTEIN DIRECT (mg/dL)   Date Value   04/10/2022 192 (H)   12/03/2021 111  HIGH DENSITY LIPOPROTEIN (mg/dL)   Date Value   04/10/2022 40   12/03/2021 39 (L)     TRIGLYCERIDES (mg/dL)   Date Value   04/10/2022 301 (H)   12/03/2021 288 (H)         IMAGING  No imaging results were found within the past 3  days.      EKG  Completed 12/11/22    CSSRS  Default Flowsheet Data (most recent)       C-SSRS Frequent Screener - 12/13/22 0900          C-SSRS Frequent Screener    Since last contact, have you had thoughts about killing yourself? No     Have you been thinking about how you might do this?  No     Have you had these thoughts and had some intention of acting on them? No     Have you started to work out or worked out the details of how to kill yourself? Did you intend to carry out this plan? No     Have you done anything, started to do anything, or prepared to do anything to end your life?  No     C-SSRS Frequent Screener Risk Score No Risk                   MENTAL STATUS EXAM  Mental Status Exam   General Appearance: Dressed appropriately  Behavior: Cooperative  Level of Consciousness: Alert  Orientation Level: Oriented to person;Oriented to situation;Oriented to place  Attention/Concentration: WNL  Mannerisms/Movements: No abnormal mannerisms/movements  Speech Quality and Rate: WNL  Speech Clarity: Clear  Speech Tone: Loud  Vocabulary/Fund of Knowledge: WNL  Memory: Grossly intact  Thought Process & Associations: Goal-directed  Dissociative Symptoms: None  Thought Content: No abnormalities reported or observed  Delusions: None  Hallucinations: None  Suicidal Thoughts: None  Homicidal Thoughts: None  Mood: Euthymic  Mood comment: "really good"  Affect: Euthymic;Labile  Judgment: Fair  Insight: Poor      DSM-5 DIAGNOSIS  DSM-V    Primary Diagnosis: Episode of recurrent major depressive disorder ([name])   Secondary Diagnosis: Borderline personality disorder ([place])          FORMULATION/ASSESSMENT   Cheryl Zimmerman is a 52yo woman with schizoaffective d/o bipolar type, PTSD, h/ past SUD (etoh, cocaine), medical history of CHF, seizure d/o, hypothyroidism, COPD, GERD, chronic pain, spinal stenosis, who presents with SI and stated plan. She has retracted these statements multiple times since being admitted, now claiming that  she had nightmares about doing that and denies any actual suicidality or wish to die. She states she is depressed and needs medication adjustment,  but she presents with incongruent, bright and cheerful affect and simultaneously states that everything is going well and she is fine. In light of this, believe that her statements were made in a moment of acute distress following upset/firing her VNA and lacking access to suboxone for two days at home - which allowed her to access treatment and care in the hospital - most consistent with affective lability related to her character pathology. There does not appear to be a serious change in her existing depressive illness, and she does not appear to have genuine intent or desire to die at this time. She has signed a 3-day letter which expires 3/27.     Patient planning to self-manage medications on discharge, which seems to be the only realistic plan at this time (though unideal). 3-day letter to expire tomorrow  and as there are no imminent safety concerns warranting further petition, patient will be discharged home tomorrow morning.     PLAN   Psychiatric  -increase fluoxetine to 60mg  daily  -buspirone 20mg  TID  -prazosin 2mg  nightly  -melatonin 3mg  nightly  -PRN olanzapine 2.5mg  q6hr for agitation  -PRN mirtazapine 7.5mg  nightly for sleep   Metabolic monitoring for patients on antipsychotics: lipid panel 04/10/22 cholesterol 263, triglycerides 301, HDL 40, LDL 192. HbA1C (07/15/22) 6.5     Seizure d/o             -keppra 750mg  BID             -oxcarbazepine 150mg  BID     Substance use d/o             -suboxone 8mg  TID     Neuropathic pain              -gabapentin 600mg  BID     Cognitive:  No current concerns      Medical:  Cardiovascular: atorvastatin 80mg  daily, spironolactone 100mg  daily, torsemide 80mg  daily   COPD: budesonide-formoterol 2 puffs BID, albuterol inhaler PRN  Nicotine replacement: nicotine gum PRN, nicotine patch daily   Back spasm: baclofen 10mg  BID,  voltaren gel, methocarbamol 750mg  TID  GERD: pantoprazole 40mg  daily  Supplement: thiamine 100mg  daily, potassium chloride 24mEq daily, multivitamin daily   Hypothyroid: levothyroxine 50mcg daily     Bowel regimen ppx: PRN milk of mag, PRN miralax   Pain ppx: PRN tylenol, rest as above   DVT ppx: Patient is ambulatory  Diet: Diet - Base Diet: Modified/Dysphagia/Restricted; Diabetes Restrictions: Consistent Carbohydrate-Moderate (4 CHO/60g per meal)     Legal/safety  CV, 3-day letter signed expires 3/27  HCP: None documented   Full Code  Checks Interval: 15 min  Medical bed    Dispo  Anticipated Discharge Plan  Lives with: Alone  Type of Home Care Services:  (had VNA, fired her, working on [employer] and companion)  Living Situation  Lives with: Alone  Type of Residence: House/apartment  Living Setting: Apartment (accessible)  -home pending clinical stability     Review with patient: Treatment plan reviewed with the patient.    I have spent 35 minutes face-to-face with patient and/or floor/unit time of which over 50% of time was spent with counseling, education and coordination of care.

## 2022-12-13 NOTE — Plan of Care (Addendum)
Problem: Mood, Altered  Description: Pt is a 52 year old female admitted to [facility] from [facility] for endorsing SI with a plan to fill up her room with oxygen from her O2 tank and blow it up. PMH includes MDD (major depressive disorder), recurrent, severe, with psychosis ([place]), Suicidal behavior with attempted self-injury ([place]), Suicidal ideation. Pt is alert and oriented. Pt was agitated, angry, irritable, yelling/screaming and using profanities towards staff during admission process and attempting to hit staff during skin check, requires multiple redirection and nursing emotional support. Pt is at risk for injury to self and others due to history of suicidal behavior, agitation, aggressive behaviors, irritable and screaming and yelling at staff.   Goal: Demonstrates/reports improvement in mood stability by discharge  Description: Patient will demonstrate 70% reduction in her aggressive behaviors and agitation by the first week of admission and will use healthy coping skills in her interactions with peers and staff on the unit during her hospital stay. Pt to attend two groups per day by 12/17/22 of admission and be active in the milieu.  Outcome: Progressing   Patient was visible propelling herself in wheelchair in the hallway, declined group The patient was offered the following alternatives to group attendance:  attending to ADLs and individual contact with nurse. Ate 100% of both meals. Took the pills but refused cream and inhaler.. Nicorette gum given X 2 per patient request. .02 sat 92% after breakfast, 02 started at 2L per nasal canula for shortness of breath.  No acute distress noted. Patient signed a 3 day note, denies SI/HI, no safety concerns. Irritable angry , no agitation noted. No Bm reported today. Continue with treatment plan.

## 2022-12-13 NOTE — Discharge Summary (Signed)
GERIATRIC PSYCHIATRY DISCHARGE SUMMARY     Date of Admission: 12/10/22    Date of Discharge: 12/14/22    "Hand-Off" Transition Issues:    -fluoxetine increased from 40mg  to 60mg  to address mood   -Need to reinstate VNA services, please f/u with [facility] and [facility] care partner regarding this. Referral and review in process  -encourage coping skills in response to distress     Summary of Reason for Admission:   Cheryl Zimmerman is a 52yo woman with schizoaffective d/o bipolar type, PTSD, h/ past SUD (etoh, cocaine), medical history of CHF, seizure d/o, hypothyroidism, COPD, GERD, chronic pain, spinal stenosis, who presents with SI and stated plan.     Hospital Course: Please see previous completed admission note for background history and data for this patient admitted to the [facility] on 3/24 on CV status, signed a 3-day letter and was discharged at the expiration of 2-day letter on 3/27.     On the psychiatric unit, patient was largely isolative to room, preferring to watch movies and shows loudly. She did not use her oxygen during the day and did not consistently wear her O2 NC at night either. She was independent with ADLs, was proficient at getting her needs met. She could be very rude and demanding toward nurses and staff.   There were no physical or chemical restraints.      Psychiatrically, patient presentation of bright affect, future-orientation and resourcefulness at getting her needs met (eg. obtained her own [facility] transportation to transport her >1hr to [facility] from [place]) is incongruent with her stated significant depression to the point of wanting to kill herself via blowing up her oxygen tank. While she made those statements initially in the ED, she did retract those statements for the entirety of her inpatient hospitalization and expressed upset that she was admitted - stating that they were actually nightmares and she never had any desire to die or kill herself because things are going well. Her  affective lability and provocative statements are most suggestive of borderline personality disorder contributing to mood swings rather than an acute worsening of depression. Did increase her fluoxetine from 40mg  to 60mg  which she stated immediate benefit from after 1 dose - again pointing away from acute depressive episode and more toward character pathology.      Cognitively, patient did not present with any gross cognitive concerns during this hospitalization     Medically, there were no acute medical incidents requiring immediate attention. She was continued on all home meds or formulary alternatives. She did use her oxygen during the day and did not consistently wear her O2 NC overnight either even with encouragement.      On day of discharge, patient is in good spirits. She denies SI/HI and states "I was never suicidal". She reports future-orientation and happiness to return home. Discussed her upcoming PCP and psychiatry appointment, and ongoing engagement with [facility] for the time being. She expresses understanding of these points.     Consults:   Medical H&P by Deirdre Peer on 3/24    Labs:  CBC:   Lab Results   Component Value Date    WBC 10.1 12/10/2022    HGB 15.0 12/10/2022    HCT 46.7 (H) 12/10/2022    PLTA 333 12/10/2022    RBC 5.05 12/10/2022     BMP:   Lab Results   Component Value Date    NA 140 12/10/2022    K 4.0 12/10/2022    CL 99  12/10/2022    CO2 29 12/10/2022    BUN 12 12/10/2022    CREAT 0.8 12/10/2022    GLUCOSER 119 12/10/2022    CA 9.6 12/10/2022     LFTs:   Lab Results   Component Value Date    AST 22 04/07/2022    ALT 27 04/07/2022    TBILI 0.4 04/07/2022    ALKPHOS 212 (H) 04/07/2022     COAGs:   Lab Results   Component Value Date    PT 12.6 10/16/2021    PT 10.4 06/02/2011    INR 1.0 10/16/2021    INR 1.0 (L) 06/02/2011    APTT 30 10/16/2021    APTT 25.4 06/02/2011       STox:   Lab Results   Component Value Date    ETOH < 10 12/10/2022    ACETA < 5 12/10/2022    SAL < 0.5  12/10/2022    BARB NEGATIVE 05/31/2011    BENZO NEGATIVE 99991111    TRICYCLIC NEGATIVE 99991111     UTox:   Lab Results   Component Value Date    COCAINE NEGATIVE 12/10/2022    AMPHET NEGATIVE 12/10/2022    OPIATES NEGATIVE 12/10/2022    BARBU NEGATIVE 06/11/2009    BENZOU NEGATIVE 12/10/2022    ETOHU NEGATIVE 12/10/2022     CANNABINOIDS  X/XX/XXXX       Lab Results   Component Value Date    TSH 1.190 12/10/2022    B12 287 12/22/2021     Urine Analysis:  Lab Results   Component Value Date    UACOL YELLOW 01/18/2022    UACLA SL CLOUDY (A) 01/18/2022    UAGLU NEGATIVE 01/18/2022    UABIL NEGATIVE 01/18/2022    UAKET NEGATIVE 01/18/2022    SPEGRAVURINE 1.010 01/18/2022    UAOCC NEGATIVE 01/18/2022    UAPH 7.0 01/18/2022    UAPRO NEGATIVE 01/18/2022    UANIT NEGATIVE 01/18/2022    LEUKOCYTES NEGATIVE 01/18/2022       Significant Diagnostic Studies:  None    CSSRS:  Default Flowsheet Data (most recent)       C-SSRS Discharge Screener - 12/13/22 1505          C-SSRS Discharge Screener    While you were here in the hospital, have you wished you were dead or wished you could go to sleep and not wake up? No     While you were here in the hospital, have you actually had thoughts about killing yourself?  No     While you were here in the hospital, have you done anything, started to do anything, or prepared to do anything to end your life? No                     Discharge Mental Status:   Mental Status Exam   General Appearance: Dressed appropriately  Behavior: Cooperative  Level of Consciousness: Alert  Orientation Level: Oriented to situation;Oriented to place;Oriented to person  Attention/Concentration: WNL  Mannerisms/Movements: No abnormal mannerisms/movements  Speech Quality and Rate: WNL  Speech Clarity: Clear  Speech Tone: Normal vocal inflection  Vocabulary/Fund of Knowledge: WNL  Memory: Grossly intact  Thought Process & Associations: Linear  Dissociative Symptoms: None  Thought Content: No abnormalities reported  or observed  Delusions: None  Hallucinations: None  Suicidal Thoughts: None  Homicidal Thoughts: None  Mood: Euthymic  Mood comment: "good"  Affect: Congruent with mood;Euthymic  Judgment: Fair  Insight: Poor  DSM5 DIAGNOSIS  DSM-V    Primary Diagnosis: Episode of recurrent major depressive disorder ([facility])   Secondary Diagnosis: Borderline personality disorder [facility])            Discharge Case Formulation:    Cheryl Zimmerman is a 52yo woman with schizoaffective d/o bipolar type, PTSD, h/ past SUD (etoh, cocaine), medical history of CHF, seizure d/o, hypothyroidism, COPD, GERD, chronic pain, spinal stenosis, who presents with SI and stated plan. She has retracted these statements multiple times since being admitted, now claiming that she had nightmares about doing that and denies any actual suicidality or wish to die. She states she is depressed and needs medication adjustment, but she presents with incongruent, bright/cheerful affect. In light of this, believe that her statements were made in a moment of acute distress following upset/firing her VNA and lacking access to suboxone for two days at home - which allowed her to access treatment and care in the hospital. This is most consistent with affective lability related to her borderline personality disorder. There does not appear to be a change in her existing depressive illness, and no signs of active psychosis. She does not appear to have genuine intent or desire to die at this time and because she signed a 3-day letter with no further safety concerns she was discharged at the expiration of the 3-day letter.     While psychotropic medications are likely not going to meaningfully change her BPD, which would require engagement in long-term behavioral therapy, supportive housing, and psychosocial supports - did increase her fluoxetine from 40mg  to 60mg  to see if it could provide benefit for her affective lability. Patient reported immediate subjective benefit from  this change. She fired her previous VNA and coordinated with outpatient supports to obtain a different VNA - which was in process at the time of discharge.     Discharge Risk Assessment: In terms of risk, low risk for violence or substance use. She does have chronically elevated risk for self-harm or suicide due to her borderline personality disorder, impulsivity and poor distress tolerance. However at this time she presents with future-orientation, resourcefulness at getting needs met, reasonable willingness to engage with treatment and care, denial of suicidality or desire to die. Her initial SI statements were likely made in the context of acute distress and to provoke caring/support from the healthcare system to meet her needs, which included getting her suboxone script (which she had been lacking for ~ 2 days at home and withdrawing). However she does not appear to genuinely want to die, as evidenced by her organizing her own transportation to travel for over an hour from [place] to [facility] to try to get help/needs met.     Based on available information and with reasonable medical certainty, there was no indication that at the time of discharge the patient presented, due to mental illness, an imminent and substantial risk of harm to themselves or others. The patient denied plan or intent to harm others upon discharge. Modifiable risk factors have been addressed, appropriate follow-up is arranged, and she has been given contact information for crisis and contingency management in the community.     Discharge Medications:      Medication List        CHANGE how you take these medications      * FLUoxetine 40 MG capsule  Commonly known as: PROzac  Take 1 capsule by mouth in the morning. Please take with 20mg  capsules for a total of 60mg  daily.  What changed: You were already taking a medication with the same name, and this prescription was added. Make sure you understand how and when to take  each.  Notes to patient: For depression     * FLUoxetine 20 MG capsule  Commonly known as: PROzac  Take 1 capsule by mouth in the morning. Please take with 40mg  capsules for a total of 60mg  daily.  What changed:   medication strength  how much to take  additional instructions  Notes to patient: For depression           * This list has 2 medication(s) that are the same as other medications prescribed for you. Read the directions carefully, and ask your doctor or other care provider to review them with you.                CONTINUE taking these medications      acetaminophen 325 MG tablet  Commonly known as: TYLENOL  Notes to patient: For pain     atorvastatin 80 MG tablet  Commonly known as: LIPITOR  Take 1 tablet by mouth in the morning.  Notes to patient: For cholesterol      baclofen 10 MG tablet  Commonly known as: LIORESAL  Notes to patient: For muscle spasms      budesonide-formoterol 160-4.5 MCG/ACT inhaler  Commonly known as: SYMBICORT  Notes to patient: For breathing     bumetanide 1 MG tablet  Commonly known as: BUMEX  Take 4 tablets by mouth in the morning.  Notes to patient: For fluid overload     buprenorphine-naloxone 8-2 MG sublingual tablet  Commonly known as: SUBOXONE  Place 1 tablet under the tongue in the morning and 1 tablet at noon and 1 tablet before bedtime. Max Daily Amount: 3 tablets. Do all this for 7 days.  Notes to patient: For pain management      busPIRone 10 MG tablet  Commonly known as: BUSPAR  Notes to patient: For anxiety      diclofenac 1 % Gel Gel  Commonly known as: VOLTAREN  Notes to patient: For pain     fluticasone 50 MCG/ACT nasal spray  Commonly known as: FLONASE  Notes to patient: For breathing and allergies     gabapentin 600 MG tablet  Commonly known as: NEURONTIN  Take 1 tablet by mouth in the morning and 1 tablet before bedtime.  Notes to patient: For neuropathy pain      hydrOXYzine 25 MG tablet  Commonly known as: ATARAX  Notes to patient: For anxiety      levETIRAcetam  750 MG tablet  Commonly known as: KEPPRA  Take 1 tablet by mouth in the morning and 1 tablet before bedtime.  Notes to patient: For seizures     levothyroxine 25 MCG tablet  Commonly known as: SYNTHROID  Take 1 tablet by mouth every morning before breakfast  Notes to patient: For thyroid      Melatonin 5 MG Tabs Tablet  Notes to patient: For sleep      methocarbamol 500 MG tablet  Commonly known as: ROBAXIN  Take 1.5 tablets by mouth in the morning and 1.5 tablets at noon and 1.5 tablets before bedtime.  Notes to patient: For pain     mirtazapine 7.5 MG tablet  Commonly known as: REMERON  Notes to patient: For sleep and mood     multivitamin tablet  Take 1 tablet by mouth in the morning.  Notes to patient: Vitamin supplement     *  nicotine 21 MG/24HR  Commonly known as: NICODERM CQ  Place 1 patch onto the skin in the morning.  Notes to patient: For smoking cessation     * nicotine 14 MG/24HR  Commonly known as: NICODERM CQ  Place 1 patch onto the skin in the morning for 14 days.  Start taking on: January 13, 2023  Notes to patient: For smoking cessation     * nicotine 7 MG/24HR  Commonly known as: NICODERM CQ  Place 1 patch onto the skin in the morning for 14 days.  Start taking on: Jan 28, 2023  Notes to patient: For smoking cessation     nicotine polacrilex 4 MG gum  Commonly known as: NICORETTE  Take 1 each by mouth as needed for Cra[place] (Nicotine)  Notes to patient: For smoking cessation     OTHER MEDICATION  1 each by Other route in the morning. Wheelchair: Bariatric motorized wheel chare (height 5'9", weight 328 lb, bmi 48.4)  For use as directed.    Please fax to [facility] at [phone]  DX: Spinal stenosis of lumbar 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 claudication, Gait instability, Morbid obesity ([name])  ICD10:M48.061, R26.81, E66.01.  Notes to patient: For transportation     OXcarbazepine 150 MG tablet  Commonly known as: TRILEPTAL  Take 1 tablet by mouth in the morning and 1 tablet before bedtime.  Notes to patient: For  seizures     pantoprazole 40 MG tablet  Commonly known as: PROTONIX  Notes to patient: For acid reflux     polyethylene glycol 17 g packet  Commonly known as: GLYCOLAX/MIRALAX  Take 1 packet by mouth in the morning.  Notes to patient: For constipation     Potassium Chloride ER 20 MEQ Tbcr  Take 20 mEq by mouth in the morning.  Notes to patient: Supplement      prazosin 2 MG capsule  Commonly known as: MINIPRESS  Notes to patient: For nightmares     spironolactone 100 MG tablet  Commonly known as: ALDACTONE  Notes to patient: For fluid overload     thiamine 100 mg tablet  Commonly known as: VITAMIN B-1  Notes to patient: Vitamin supplement            * This list has 3 medication(s) that are the same as other medications prescribed for you. Read the directions carefully, and ask your doctor or other care provider to review them with you.                   Where to Get Your Medications        These medications [facility] to [id number], [place] - [address]  [address] [place] [address]      Phone: [phone]   FLUoxetine 20 MG capsule  FLUoxetine 40 MG capsule         Current number of prescribed, standing, antipsychotic meds: 0     The following side effects, risks and precautions regarding antipsychotic medications were reviewed: Extrapyramidal symptoms (EPS), weight gain, amenorrhea, metabolic syndrome and tardive dyskinesia (TD). The nature of EPS symptoms (stiffness, tremor, restlessness) and strategies for managing them, should they appear.The potential for these medications to cause orthostatic hypotension and reviewed orthostatic precautions.The need to regularly monitor for weight gain and signs of metabolic disruption (increased serum cholesterol, glucose, and/or triglycerides) and take steps to prevent them because these effects can lead to diabetes and its associated health risks. The need to regularly  monitor for evidence of TD, and that these symptoms are in some cases  irreversible. The potential impact of these medications on heart rhythm, which may require that we check ECG's.     For elderly patients with dementia: These drugs are associated with higher rates of death. Patient/HCP understand black box warning of increased risk of 1.6 to 1.7 times that of placebo of increased risk of strokes, cardiovascular and/or cerebrovascular events, respiratory problems and even death rates from use of antipsychotic agents in the elderly with dementia but the benefits and risks were carefully reviewed and benefits outweigh the risks and patient's HCP gave informed consent for continued antipsychotic treatment.    Risks/benefits/alternatives and side effects of rest of psychotropic medications were discussed with patient/HCP who gave informed consent for above treatment plan.     Discharge Activity: activity as tolerated    Discharge Diet: diabetic diet      Disposition/Living Situation:  Anticipated Discharge Plan  Expected Discharge Date: 12/14/22  Lives with: Alone  Anticipated Outcome: Home (Independent);Other (comment) (VNA is pending approval from patient's insurance; patient is approved for companion and homemaking services.)  Transition Record: The patient/guardian consented to have discharge documentation sent to the next level of care provider: Yes  Transition Record: Discharge documentation was sent to: PCP  Provider name and type (MD, SW, etc.): Cora Collum, MD  Date of communication: 12/14/22  Time of communication: 1000  Method of communication: Access to shared EMR  Reason No Scheduled 7-Day Aftercare Appointment:  (N/A)  Discharge documentation also sent to: Other provider 1  Other Provider Info (1): Arletha Pili, [facility], Team Leader, fax: [phone]  Transportation at Discharge: Ambulance (through [facility])  Patient expects to be discharged to:: Home  Address: [place] [address]  Contact Number After Discharge: [phone]  Best time of day to contact:  Anytime  Nurse/Team has permisson to speak with: [name]  Relationship to Patient: Self  Interpreter Requested: No  Home Care Services: Yes  Type of Home Care Services: VNA/Nurse visit;Homemaker  Family Notified of Disposition: No (comment)    Discharge Instructions:  Follow-up Information       Follow up With Specialties Details Why Contact Info    Arletha Pili, Team Leader  Call on 12/14/2022 You are still connected with [facility] until you have officially been transfer to another agency. Contact [name] if you need any assistance. [facility] 2    [phone]    [facility], Supervisor  Call on 12/14/2022 Reach out to [place] about when your homemaking and companion services can start. Debe Coder is also helping you with getting VNA services. [facility]    [phone]    Televisit Psych appointment with Alinda Deem  Call on 12/31/2022  Cordelia Pen    You will be receiving a link from your provider to join the televisit appointment.            Scheduled Appointments      Dec 15, 2022  1:30 PM  TELEVISIT (Video Visit) with Cora Collum, MD  [facility] ([place]) [address]  [place] [address]  [phone]   You have a video televisit appointment with your [facility] provider within the next 7 days. You will receive a notification about this the day before your appointment.  Thirty minutes before your appointment you will receive a link to your video visit in [place] and via  text and email before your scheduled appointment time.  The link will walk you through steps to make sure you are set up to connect with your provider at the time of the visit and give you access to the video visit.  If you have trouble connecting with your provider, they will call you at the time of your visit at the phone number your clinic has on file.          Dec 17, 2022  4:00 PM  PS TELEVISIT PHONE THERAPY 30 with Phillip Heal, PsyD  [facility]  (--) [address]  [address] [place] [address]  [phone]   You have a video televisit appointment with your [facility] provider within the next 7 days. You will receive a notification about this the day before your appointment.  Thirty minutes before your appointment you will receive a link to your video visit in [place] and via text and email before your scheduled appointment time.  The link will walk you through steps to make sure you are set up to connect with your provider at the time of the visit and give you access to the video visit.  If you have trouble connecting with your provider, they will call you at the time of your visit at the phone number your clinic has on file.                 I have spent > 35 minutes on the discharge planning and coordination for this patient.

## 2022-12-13 NOTE — Multidisciplinary Treatment Plan of Care (Signed)
Written Treatment Plan Progress  Cheryl Zimmerman 2    Cheryl Zimmerman    Admission date: 12/10/22    Problem/Goal Progress       Cheryl Zimmerman's individual and/or guardian stated goal: go home  Cheryl Zimmerman/guardian reports goal progressing, as evidenced by signed 3-day letter, taking medications    Hospital Problems as of 12/13/2022            Resolved       Hospital    1. * (Principal) Depressive disorder      Patient-Centered Goal  no SI, measured by C-SSRS Frequent Screener       Goal Progress  #1 Problem/Symptom  goals progressing, as evidenced by med-adherent, participating in some treatment, future-oriented and no SI reported.    Additional Patient-Centered Interventions       To support the treatment plan, the team will do the following:     Physician  use evidenced-based psychopharmacology  participate in safety planning and lethal means restriction counseling  sleep hygiene  provide psychoeducation about patient's illness, symptoms    Social Work  assist patient with referrals before discharge  collaboration with providers / care coordination during hospitalization  coordinate discharge throughout hospitalization  facilitate family meetings as needed    Rehab Therapy   OT will Monitor for: ability to ID and utilize safe alternative coping skills for emotional regulation, self-soothing, anxiety management, self esteem, stress management, and anger management.  Provide psycho-education in a group and/or in an individual session for: symptom management, stress management, anger management, coping skills, communication skills, self awareness, self worth, SBT, CBT/DBT, and dual diagnosis education at least once per week during hospitalization.  Provide education and assessment around sensory preferences/sensitivities.  Assist in developing self-regulation skills through activity participation and practice using sensory tools at least once per week during hospitalization.    Nursing  See Care Plan

## 2022-12-13 NOTE — Group Note (Signed)
OT Psych Group Documentation    Topics: Breakfast Bites    Occupations Addressed: Activities of Daily living, Social participation, Instrumental activities of daily living (health management & maintenance)   Skills addressed: Fine/gross motor skills, process skills, social interaction skills, behavioral control, mental functions (cognitive, perceptual, affective)             Date: 12/13/2022  Start Time: 0745  End Time: 0845    Attendance: Few minutes  Participation/Patient Response: Asked questions related to topic, Attentive/able to focus, Contributed to discussion, and Shared about self  Participation within a task: Able to complete task independently, Demonstrated ability to make decisions, and Responsive to cues/limits/redirection from staff  Treatment Goals Addressed: Development/maintenance of a health lifestyle, Increased attention/concentration/focus, Increased behavioral control, Increased problem solving, Mood/emotion regulation, and Verbalization of needs    Comments: did not join, in dining room prior but left when breakfast came, declined her meal but did come back later to fill out menu, indep in that task, self directed with needs    Bari Mantis, OT  lic# [id number]

## 2022-12-13 NOTE — Group Note (Signed)
OT Psych Group Documentation    Topics: Project    Occupations Addressed: Leisure, Social participation   Skills addressed: Fine/gross IT trainer, process skills, social interaction skills, behavioral control, mental functions     Date: 12/13/2022  Start Time: 1100  End Time: [id number]    Comments: in room, did not join    Bari Mantis, [place]  lic# [id number]

## 2022-12-13 NOTE — Progress Notes (Signed)
Writer spoke yesterday with Cheryl Zimmerman at [place].  She reported pt had fired her VNA, Cheryl Zimmerman prior to coming into the hospital.  The VNA was using a lock box to store her medications and pt reportedly does not know the code for the lock box.  Pt had been referred to 10-15 other VNAs after moving to [place] but only [facility] had accepted her and now, per [facility], they will not accept her back even if pt requests to work with them again.      Cheryl Zimmerman suggested the team contact her behavioral health care partner at [facility], Cheryl Zimmerman, regarding pt's access to medications and care.   Cheryl Zimmerman said pt is part of a [facility] home health program and does have home health, cleaning and home delivered meals services.      Cheryl Zimmerman also said pt does have a psychiatrist through Cheryl Zimmerman, Cheryl Zimmerman, next appointment on 12/31/22.     Cheryl Zimmerman is in the process of trying to transfer pt to a new [facility] team since she lives in another catchment area now.

## 2022-12-13 NOTE — Case Mgmt Note (Signed)
12/10/22 -PR- UR admission note:   Patient is a 52 year old female who presented  from home in [place] where she lives alone. She has a diagnosis of Schizoaffective d/o, Bipoloar type, past SUD and medical hx of CHF, seizure d/o, hypothyroidism, COPD and uses O2, GERD, Chronic Pain, Spinal Stenosis who presents to the hospital from home with depression over her son's death and she was feeling suicidal  with a plan to blow herself up with her oxygen tank at home. She is also withdrawing from suboxone and she was having some conflicts with her home health nurse. "I don't see any point in living". She still has her [facility] and providers through [place] however she now lives in [place] independently. No reviews are required with this insurance and SW has the [facility] contacts. UR will assist as needed.

## 2022-12-13 NOTE — Plan of Care (Signed)
Patient alert and responsive, observed in bed watching movies on the tablet, irritable on approach, rude, angry, dismissive, given HS meds with water, refused topical Voltaren gel, patient remains in bed watching movies in good behavior control, will continue to monitor.

## 2022-12-13 NOTE — Plan of Care (Signed)
Patient alert and responsive, received in bed resting in no acute distress, irritable on approach,  refused to wear NC oxygen, sat at 93% RA, voiced no complaints, compliant with med slept 4.7 hrs, safety precaution maintained, will continue with plan of care.

## 2022-12-13 NOTE — Progress Notes (Signed)
Social Work Geriatric Progress Note        Clinical Update:  Pt was reviewed in clinical meeting this morning. MD Dorna Bloom and this Clinical research associate went to see patient in her room. Patient was sitting in a wheelchair and was watching something on the tablet. Patient stated that she is doing feel good. Patient asked MD if she can write a letter for patient to get an emotional support animal. MD informed her that she cannot do it due to not having the certification. Patient stated that she cannot sleep because another female patient keeps coming into her room. When asked about not having a VNA, patient stated that she can self-administer her medications. She reported that she has a pill organizer for the whole week. Patient will continue taking her medications. Patient denies SI. Patient stated that she feels fine. Patient requested for an early discharge and is okay with 10 am. Patient was informed that she will be scheduled a 10 am transportation through [facility].           Per Silvio Clayman, LICSW's note, 12/13/2022:  Leeroy Bock also said pt does have a psychiatrist through [facility], [place], next appointment on 12/31/22. Woody Seller is in the process of trying to transfer pt to a new [facility] team since she lives in another catchment area now. (Televisit)    -Patient is scheduled for a 10 am pickup through ambulance from [facility]. (Reference #: [id number] for ambulance)    Patient already has a scheduled televisit appointment with her PCP, [name] on December 15, 2022.    Left voice message for Lindell Noe, [phone], [facility] Care Partner/ Home Health Program, to discuss discharge plan for tomorrow.       SW spoke with Worthy Keeler, Team Leader, [facility] Team 2, [phone]:  -She asked if this writer spoke with Lennox Laity and this Clinical research associate informed her that she left her a voice message. [name] stated that pt cannot discharge without plan around her medications.  -Leeroy Bock will try to contact Lennox Laity  and also her supervisor        Wyn Forster, Merchandiser, retail at [facility], [phone]:  -She reported that patient has a pending approval with [facility] [employer] ([facility] would need to approve it and the typical turnout can be up to 14 days and the authorization was placed on 12/09/22)  -She is approved for a companion and homemaker [facility]. She will fax over the approval letter.         Spoke with [facility] and [place]   -Patient has a current authorization with [facility]. [name] stated that [facility] is not going to take patient back. Patient fired the VNA services last week. They provided the lock box to [place] and PCP. It has been hard to get VNA services for patient since [facility].   -Patient has been refusing to speak with Woody Seller since Friday.      Woody Seller informed patient that it will be a process for her to be transitioned to another agency since she is in a new catchment area.   -This Clinical research associate informed Woody Seller that PCP does not have the lock box combination to the meds.   -Patient has been going through multiple hospitalizations.   -Leeroy Bock (fax: [phone]) requested to fax discharge summary to her.           Key Collaterals:   Worthy Keeler, Team Leader, [facility] Team 2, [phone]  Lindell Noe, [facility] Care Partner, [phone]  [name], MD, PCP, [facility] Primary  Care at [facility]:  -Meet with patient daily to assess mood, sleep and appetite and monitor progress in treatment  -Speak with collaterals, as need, to gain collateral information   -Evaluate living situation and support system  -Coordinate treatment and discharge with multidisciplinary team and collaterals         Barriers to Discharge:   -patient's 3 day notice expires tomorrow Wednesday March 27 and patient need access to her medications         Redonna Wilbert Jones-Swaby, LCSW

## 2022-12-14 ENCOUNTER — Other Ambulatory Visit (HOSPITAL_BASED_OUTPATIENT_CLINIC_OR_DEPARTMENT_OTHER): Payer: Self-pay | Admitting: Family Medicine

## 2022-12-14 ENCOUNTER — Other Ambulatory Visit (HOSPITAL_BASED_OUTPATIENT_CLINIC_OR_DEPARTMENT_OTHER): Payer: Self-pay

## 2022-12-14 DIAGNOSIS — F250 Schizoaffective disorder, bipolar type: Secondary | ICD-10-CM | POA: Diagnosis not present

## 2022-12-14 DIAGNOSIS — F603 Borderline personality disorder: Secondary | ICD-10-CM | POA: Diagnosis not present

## 2022-12-14 DIAGNOSIS — R45851 Suicidal ideations: Secondary | ICD-10-CM | POA: Diagnosis not present

## 2022-12-14 DIAGNOSIS — F339 Major depressive disorder, recurrent, unspecified: Secondary | ICD-10-CM | POA: Diagnosis not present

## 2022-12-14 MED ORDER — BUPRENORPHINE HCL-NALOXONE HCL 8-2 MG SL SUBL
1.0000 | SUBLINGUAL_TABLET | Freq: Three times a day (TID) | SUBLINGUAL | 0 refills | Status: DC
Start: 2022-12-14 — End: 2022-12-15

## 2022-12-14 NOTE — Group Note (Signed)
OT Psych Group Documentation      Topic: Breakfast Bites      Occupations Addressed: Activities of Daily Living, Social participation, Health Management (Feeding, Functional Mobility, Nutrition Management, Peer Group Participation)   Performance Skills and Functions: Expressing needs, Choosing, pacing, inquiring, organizing, positioning, reaching, navigating, regulating, starting and concluding social interactions  Description: Group participants engage and are assisted during their morning meal time, which is both a necessary self care task as well as a meaningful social activity. This may include direct assistance with feeding, completing a menu for future meals, and promoting appropriate behavioral control, and social interaction.           Date: 12/14/2022  Start Time: 0745  End Time: 0845    Attendance: Sallyanne Kuster, OTA  lic# [id number]

## 2022-12-14 NOTE — Progress Notes (Signed)
SW Geriatric Discharge Note      Cheryl Zimmerman, is a 52 year old, white female with a history of schizoaffective d/o bipolar type, PTSD, h/ past SUD (etoh, cocaine), medical history of CHF, seizure d/o, hypothyroidism, COPD, GERD, chronic pain, spinal stenosis who was admitted to [facility] on December 10, 2022 after reporting SI with a plan. During patient's brief hospital stay, patient denied SI. Patient signed CV and a 3 day notice which expired today, Wednesday December 14, 2022. Patient will be discharged home ([place] [address]) at 10 am and will be picked up by ambulance through [facility] transportation.     Discharge summary will be faxed to [facility] Team, Cheryl Zimmerman (fax: [phone])                Follow up:  PCP appt: televisit appointment with Cheryl Collum, MD on Thursday December 15, 2022 @ 1:30 pm    Psych appt: 7 days discharge phone call with Cheryl Zimmerman on December 17, 2022 at 4 pm            Televisit appointment with Cheryl Zimmerman [facility] provider, Cheryl Zimmerman on December 31, 2022    VNA: is still being set up through [facility]: n/a        Key Collaterals:   Cheryl Zimmerman, Team Leader, Cheryl Zimmerman Team 2, [phone]/ fax: [phone]  [facility], Supervisor, [facility], [place], [place] Partner, [phone]  Cheryl Collum, MD, PCP, [facility] Primary Care at [place], [place]. [phone]

## 2022-12-14 NOTE — Discharge Instructions (Signed)
Reason for hospitalization:   You came to the hospital for depression    Important results:   While in the hospital, you received comprehensive psychiatric evaluation and treatment.    Medication changes:  -increased fluoxetine to 60mg  daily   -all other medications remained the same     Next steps:  Please follow-up with your outpatient psychiatric treatment team  Please take all medications as prescribed   Please talk with your family and social circle about strategies to take care of yourself and ways to reduce stress

## 2022-12-14 NOTE — Plan of Care (Addendum)
Problem: Mood, Altered  Description: Pt is a 52 year old female admitted to [facility] from [facility] [facility] for endorsing SI with a plan to fill up her room with oxygen from her O2 tank and blow it up. PMH includes MDD (major depressive disorder), recurrent, severe, with psychosis ([place]), Suicidal behavior with attempted self-injury ([place]), Suicidal ideation. Pt is alert and oriented. Pt was agitated, angry, irritable, yelling/screaming and using profanities towards staff during admission process and attempting to hit staff during skin check, requires multiple redirection and nursing emotional support. Pt is at risk for injury to self and others due to history of suicidal behavior, agitation, aggressive behaviors, irritable and screaming and yelling at staff.   Goal: Demonstrates/reports improvement in mood stability by discharge  Description: Patient will demonstrate 70% reduction in her aggressive behaviors and agitation by the first week of admission and will use healthy coping skills in her interactions with peers and staff on the unit during her hospital stay. Pt to attend two groups per day by 12/17/22 of admission and be active in the milieu.  Outcome: Adequate for Discharge   Patient discharged home , picked up by driver from [place]. Took all morning medications prior discharge. AVS reviewed and given with patient prior discharge. Pleasant calm, denies SI/HI ,no safety concerns. Patient has no valuable from the safe.No voiced complaints.

## 2022-12-14 NOTE — Plan of Care (Signed)
Pt received sleeping in bed, continue on O2  at 2 lit via N/C.  Independent with BR. Pt later on took off her oxygen, O2 sat 93% on RA at 0600. No apparent distress. No Covid symptoms noted. No behavioral issues noted. Pt compliant with med. Pt slept for 8.9 hrs. Pt continued to be on 15 min safety checks.

## 2022-12-14 NOTE — Telephone Encounter (Signed)
PER Patient (self), Cheryl Zimmerman is a 52 year old female has requested a refill of SUBOXONE      Last prescribed - start date: 12/09/2022 end date: 12/16/2022    Last Office Visit: 12/01/2022  Last Physical Exam: N/A  There are no preventive care reminders to display for this patient.    Other Med Adult:  Most Recent BP Reading(s)  12/14/22 : 115/68        Cholesterol (mg/dL)   Date Value   04/10/2022 263 (H)     LOW DENSITY LIPOPROTEIN DIRECT (mg/dL)   Date Value   04/10/2022 192 (H)     HIGH DENSITY LIPOPROTEIN (mg/dL)   Date Value   04/10/2022 40     TRIGLYCERIDES (mg/dL)   Date Value   04/10/2022 301 (H)         THYROID SCREEN TSH REFLEX FT4 (uIU/mL)   Date Value   07/15/2022 5.540 (H)         TSH (THYROID STIM HORMONE) (uIU/mL)   Date Value   12/10/2022 1.190       HEMOGLOBIN A1C (%)   Date Value   07/15/2022 6.5 (H)       No results found for: "POCA1C"      INR (no units)   Date Value   06/02/2011 1.0 (L)   06/02/2011 < 1.0 (*L)     INR CARE EVERYWHERE (no units)   Date Value   10/16/2021 1.0   09/23/2021 0.9   01/06/2021 1.1   11/11/2020 1.0   07/05/2020 0.9   07/04/2020 0.8 (L)   03/13/2020 1.0       SODIUM (mmol/L)   Date Value   12/10/2022 140       POTASSIUM (mmol/L)   Date Value   12/10/2022 4.0           CREATININE (mg/dL)   Date Value   12/10/2022 0.8       Documented patient preferred pharmacies:      [id number], [place] - [address]  Phone: [phone] Fax: [phone]

## 2022-12-15 ENCOUNTER — Telehealth (HOSPITAL_BASED_OUTPATIENT_CLINIC_OR_DEPARTMENT_OTHER): Payer: Self-pay

## 2022-12-15 ENCOUNTER — Ambulatory Visit: Payer: No Typology Code available for payment source

## 2022-12-15 DIAGNOSIS — G894 Chronic pain syndrome: Secondary | ICD-10-CM

## 2022-12-15 DIAGNOSIS — I5030 Unspecified diastolic (congestive) heart failure: Secondary | ICD-10-CM

## 2022-12-15 DIAGNOSIS — R45851 Suicidal ideations: Secondary | ICD-10-CM | POA: Diagnosis not present

## 2022-12-15 DIAGNOSIS — F1120 Opioid dependence, uncomplicated: Secondary | ICD-10-CM | POA: Diagnosis not present

## 2022-12-15 LAB — EKG

## 2022-12-15 MED ORDER — CVS PULSE OXIMETER MISC
1.0000 | Freq: Every day | 0 refills | Status: AC
Start: 2022-12-15 — End: 2023-01-14

## 2022-12-15 MED ORDER — BUPRENORPHINE HCL-NALOXONE HCL 8-2 MG SL SUBL
1.00 | SUBLINGUAL_TABLET | Freq: Three times a day (TID) | SUBLINGUAL | 0 refills | Status: AC
Start: 2022-12-15 — End: 2022-12-22

## 2022-12-15 NOTE — Case Mgmt Note (Addendum)
12/14/22 -PR- UR discharge note:  Patient reviewed her discharge plans with the treatment team including medications, aftercare appointments and crisis plan. Patient was in good spirits and behavioral control upon discharge back home via ambulance via [place].Marland Kitchen Her 3 day notice expires today. She was in agreement with her discharge plan and did not wish to appeal anything. The MCR IM was sent to medical records. She will be followed through [facility] and the [facility]. SW completes the discharge review. Her [facility] insurance remained in effect throughout this admission from 12/10/22 through discharge on 12/14/22.

## 2022-12-15 NOTE — Progress Notes (Signed)
RESIDENT PRIMARY CARE NOTE       SUBJECTIVE  Cheryl Zimmerman is a 52 year old English-speaking female here for:    Chief concern(s): No chief complaint on file.     #OUD  #chronic pain  -has been without suboxone since 3/27 as [facility]one [facility], pt was expecting it at her local pharmacy  -no SI since discharge, feeling well overall, thinks prozac increase helped  -gabaepntin filled has sufficient supply    #Person at risk of homelessness  -has own apartment, wheelchair accessible  -needs new VNA referral  -fired VNA because lying about medication doses and things  -requesting home vna/pt  -waiting on Ch[facility] aid through [facility]/insurance?; SW through  pts insurance company  -Getting meals delivered  [employer] (friend/neighbor) helping with dishes, house things    #HFpEF  #OHS/OSA  -no sob, feels breathing well, 2L NC at night; uses prn during day, no pulse ox  -no current maintenance/loop diuretic  -on statin, spiro    ROS - Pertinent symptoms as noted above, other ROS negative    Active Medical Issues:  Patient Active Problem List:     Dyspnea on minimal exertion     Suicidal behavior with attempted self-injury ([place])     Tobacco use disorder     Systolic congestive heart failure ([name])     Seizure disorder ([place])     Depressive disorder     Suicidal ideation     Afib ([place])     Alcohol use disorder     Borderline personality disorder ([place])     Chronic pain disorder     Morbid obesity ([place])     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 ([place])     PTSD (post-traumatic stress disorder)     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     (HFpEF) heart failure with preserved ejection fraction ([name])     MDD (major depressive disorder), recurrent, severe, with psychosis ([place])     Requires continuous at home supplemental oxygen     Spinal stenosis of lumbar 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 claudication     Fall in home     Gait instability     Hypokalemia     Lung nodule     Stable angina     Chest pain, atypical     Obesity hypoventilation  syndrome ([place])     Osteoarthritis of spine with radiculopathy, lumbar region     Chronic bilateral low back pain with bilateral sciatica      Past medical, surgical, and social history reviewed.       OBJECTIVE  There were no vitals taken for this visit.  Most Recent Weight Reading(s)  12/11/22 : (!) 158.8 kg (350 lb)  12/10/22 : (!) 158.8 kg (350 lb)  10/03/22 : (!) 158.8 kg (350 lb)  08/22/22 : (!) 156.5 kg (345 lb)  07/16/22 : (!) 155.5 kg (342 lb 12.8 oz)      Most Recent BP Reading(s)  12/14/22 : 115/68  12/10/22 : 110/75  12/01/22 : 117/82  10/03/22 : 134/98  08/22/22 : 131/83      I personally reviewed relevant labs, imaging, and studies in EpicCare.       ASSESSMENT AND PLAN  Cheryl Zimmerman is a 52 year old female who presents for f/u recent discharge for SI, suboxone f/u.     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 ([place])  Comment: been without suboxone for 1-2  days due to pharmacy mix-up, will rx suboxone to pt's pharmacy, 8 TID, f/u next week, ideally for in person and UDS, ride to be arranged through pt's CCM  Plan: buprenorphine-naloxone (SUBOXONE) 8-2 MG         sublingual tablet, REFERRAL TO VISITING NURSE         SERVICES (EXT)    (R45.851) Suicidal ideation  Comment: No SI 2 days since discharge, prozac increased, remains at high risk for SI  Plan: F/u w/ psych, this writer to check in next week    (XX123456) Diastolic congestive heart failure, unspecified HF chronicity ([place])  Comment: symptomatically at baseline, oxygen at night and prn during day, difficult to assess over phone.  Plan: In person visit asap to assess cardiopulmonary dz/chronic hypoxema, HFpEF, OHS/OSA  Re-referred for new VNA      Follow up in approximately 1 week    Discussed with Attending Dr. Jacky Kindle, MD  Cora Collum, MD, 12/15/2022    Resident Physician

## 2022-12-15 NOTE — Telephone Encounter (Signed)
Vna referral faxed to [facility] at [phone].   Fax confirmation received.     [name], RN, 12/19/2022

## 2022-12-15 NOTE — Case Mgmt Note (Incomplete)
12/15/22 -PR- UR discharge note:  Patient reviewed her discharge plan with the treatment team including medications, aftercare appointments and crisis plan. Patient was in good spirits and behavioral control upon discharge back home upon expiration of his 3 day letter he submitted. He will be followed through [facility] team and their Home Program and her PCP (see AVS details). The MCR IM was sent to medical records. Patients

## 2022-12-16 ENCOUNTER — Other Ambulatory Visit (HOSPITAL_BASED_OUTPATIENT_CLINIC_OR_DEPARTMENT_OTHER): Payer: Self-pay | Admitting: Family Medicine

## 2022-12-17 ENCOUNTER — Ambulatory Visit (LOCAL_COMMUNITY_HEALTH_CENTER): Payer: No Typology Code available for payment source | Admitting: Psychiatry - General

## 2022-12-17 ENCOUNTER — Telehealth (LOCAL_COMMUNITY_HEALTH_CENTER): Payer: Self-pay | Admitting: Psychiatry - General

## 2022-12-17 NOTE — Telephone Encounter (Signed)
PER Pharmacy, Cheryl Zimmerman is a 52 year old female has requested a refill of atorvastatin, levetiracetam, levothyroxine and spironolactone.      Last Office Visit: 12/15/2022 with PCP  Last Physical Exam: n/a    There are no preventive care reminders to display for this patient.    Other Med Adult:  Most Recent BP Reading(s)  12/14/22 : 115/68        Cholesterol (mg/dL)   Date Value   04/10/2022 263 (H)     LOW DENSITY LIPOPROTEIN DIRECT (mg/dL)   Date Value   04/10/2022 192 (H)     HIGH DENSITY LIPOPROTEIN (mg/dL)   Date Value   04/10/2022 40     TRIGLYCERIDES (mg/dL)   Date Value   04/10/2022 301 (H)         THYROID SCREEN TSH REFLEX FT4 (uIU/mL)   Date Value   07/15/2022 5.540 (H)         TSH (THYROID STIM HORMONE) (uIU/mL)   Date Value   12/10/2022 1.190       HEMOGLOBIN A1C (%)   Date Value   07/15/2022 6.5 (H)       No results found for: "POCA1C"      INR (no units)   Date Value   06/02/2011 1.0 (L)   06/02/2011 < 1.0 (*L)     INR CARE EVERYWHERE (no units)   Date Value   10/16/2021 1.0   09/23/2021 0.9   01/06/2021 1.1   11/11/2020 1.0   07/05/2020 0.9   07/04/2020 0.8 (L)   03/13/2020 1.0       SODIUM (mmol/L)   Date Value   12/10/2022 140       POTASSIUM (mmol/L)   Date Value   12/10/2022 4.0           CREATININE (mg/dL)   Date Value   12/10/2022 0.8       Documented patient preferred pharmacies:    [id number] [place] - [address]  Phone: [phone] Fax: [phone]

## 2022-12-17 NOTE — Telephone Encounter (Signed)
Call to pt for follow up. LM - introduce role, inviting call back, provided [place] contact info

## 2022-12-19 NOTE — Progress Notes (Signed)
PRECEPTOR NOTE  On the day of the patient’s visit, I personally reviewed the key elements of history, physical exam, and findings as described in resident's note with the resident.    I agree with the assessment and plan as described in resident’s note.  Please see resident's note for further details.

## 2022-12-25 ENCOUNTER — Encounter (LOCAL_COMMUNITY_HEALTH_CENTER): Payer: Self-pay | Admitting: Social Work, Licensed Independent

## 2022-12-25 ENCOUNTER — Ambulatory Visit
Payer: No Typology Code available for payment source | Attending: Psychiatry | Admitting: Social Work, Licensed Independent

## 2022-12-25 ENCOUNTER — Telehealth (HOSPITAL_BASED_OUTPATIENT_CLINIC_OR_DEPARTMENT_OTHER): Payer: Self-pay

## 2022-12-25 DIAGNOSIS — F32A Depression, unspecified: Secondary | ICD-10-CM | POA: Diagnosis not present

## 2022-12-25 NOTE — Telephone Encounter (Signed)
Called pt, still has enough suboxone, approved refills of other meds (e.g. keppra). Pt to reach out to clinic when needs suboxone refill, didn't want to talk at this time as is a Sunday

## 2022-12-25 NOTE — Progress Notes (Signed)
ADULT OUTPATIENT PSYCHIATRY (OPD and PCBHI)  THERAPY FOLLOW UP     BH TREATMENT TEAM:  No data was found    CHIEF COMPLAINT: "I am glad you called. A friend of mine died. She OD'd on her medications and died. She waited until she went home to commit suicide."    INTERVAL HISTORY:  Post hospital follow-up call.    CURRENT MEDICATIONS:    Current Outpatient Medications   Medication Sig    Misc. Devices ([employer] PULSE OXIMETER) MISC Use 1 each As Directed in the morning.    FLUoxetine (PROZAC) 20 MG capsule Take 1 capsule by mouth in the morning. Please take with 40mg  capsules for a total of 60mg  daily.    FLUoxetine (PROZAC) 40 MG capsule Take 1 capsule by mouth in the morning. Please take with 20mg  capsules for a total of 60mg  daily.    mirtazapine (REMERON) 7.5 MG tablet Take 7.5 mg by mouth nightly    spironolactone (ALDACTONE) 100 MG tablet Take 100 mg by mouth in the morning.    budesonide-formoterol (SYMBICORT) 160-4.5 MCG/ACT inhaler Inhale 2 puffs into the lungs in the morning and 2 puffs before bedtime.    methocarbamol (ROBAXIN) 500 MG tablet Take 1.5 tablets by mouth in the morning and 1.5 tablets at noon and 1.5 tablets before bedtime.    levothyroxine (SYNTHROID) 25 MCG tablet Take 1 tablet by mouth every morning before breakfast    bumetanide (BUMEX) 1 MG tablet Take 4 tablets by mouth in the morning.    atorvastatin (LIPITOR) 80 MG tablet Take 1 tablet by mouth in the morning.    gabapentin (NEURONTIN) 600 MG tablet Take 1 tablet by mouth in the morning and 1 tablet before bedtime.    levETIRAcetam (KEPPRA) 750 MG tablet Take 1 tablet by mouth in the morning and 1 tablet before bedtime.    OXcarbazepine (TRILEPTAL) 150 MG tablet Take 1 tablet by mouth in the morning and 1 tablet before bedtime.    busPIRone (BUSPAR) 10 MG tablet Take 2 tablets by mouth in the morning and 2 tablets at noon and 2 tablets before bedtime.    polyethylene glycol (GLYCOLAX/MIRALAX) 17 g packet Take 1 packet by mouth in the  morning.    Potassium Chloride ER 20 MEQ TBCR Take 20 mEq by mouth in the morning.    Multiple Vitamin (MULTIVITAMIN) tablet Take 1 tablet by mouth in the morning.    nicotine (NICODERM CQ) 21 MG/24HR Place 1 patch onto the skin in the morning.    [START ON 01/13/2023] nicotine (NICODERM CQ) 14 MG/24HR Place 1 patch onto the skin in the morning for 14 days.    [START ON 01/28/2023] nicotine (NICODERM CQ) 7 MG/24HR Place 1 patch onto the skin in the morning for 14 days.    nicotine polacrilex (NICORETTE) 4 MG gum Take 1 each by mouth as needed for Craving (Nicotine)    acetaminophen (TYLENOL) 325 MG tablet Take 2 tablets by mouth every 6 (six) hours as needed for Pain    baclofen (LIORESAL) 10 MG tablet Take 1 tablet by mouth in the morning and 1 tablet before bedtime.    diclofenac (VOLTAREN) 1 % GEL Gel Apply 2 g topically in the morning and 2 g at noon and 2 g in the evening and 2 g before bedtime.    prazosin (MINIPRESS) 2 MG capsule Take 1 capsule by mouth nightly    pantoprazole (PROTONIX) 40 MG tablet Take 1 tablet by mouth in the  morning.    Melatonin 5 MG TABS Tablet Take 5 mg by mouth nightly    fluticasone (FLONASE) 50 MCG/ACT nasal spray 2 sprays by Each Nostril route in the morning and 2 sprays before bedtime.    hydrOXYzine (ATARAX) 25 MG tablet Take 25 mg by mouth every 6 (six) hours as needed for Anxiety    thiamine (VITAMIN B-1) 100 mg tablet Take 100 mg by mouth in the morning.    OTHER MEDICATION 1 each by Other route in the morning. Wheelchair: Bariatric motorized wheel chare (height 5'9", weight 328 lb, bmi 48.4)  For use as directed.    Please fax to [facility] at [phone]  DX: Spinal stenosis of lumbar 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 claudication, Gait instability, Morbid obesity ([name])  ICD10:M48.061, R26.81, E66.01.     No current facility-administered medications for this visit.         LANGUAGE OF CARE:    English      LANGUAGE NEEDS MET:    English Used Effectively By Patient    CURRENT  TREATMENT/CONTACT INFO FOR OTHER AGENCIES AND MENTAL HEALTH PROVIDERS (if applicable):     Contact/Community Support    No documentation.                   COLUMBIA RISK ASSESSMENTS:     Suicide:  CSSR OP LAST CONTACT    No data to display        C-SSRS Risk Assessment    No documentation.                   VIOLENCE:    Violence/Abuse Risk    No documentation.                    ADDITIONAL SCREENINGS:             03/25/2022     2:08 PM 07/18/2022     4:33 PM 12/13/2022    12:29 PM   SDOH LAST 3 VALUES   Patient is willing to answer these questions   No   What is your housing situation today? I do not have housing - I am staying with others, living in a hotel, in a shelter, outside, in a car, in an abandoned building, or in a public place I have housing.    In the last 12 months we worried that our food would run out before we got money to buy more. Never True     In the last 12 months the food we bought didn't last and we didn't have money to get more. Never True     In the last 12 months has the electric, gas or oil company threatened to shut off services in your home? No     In the last 12 months have you or your family ever had trouble getting transportation to medical appointments? No     Are you unemployed and looking for work? No No    Can we refer you to free or low cost community programs (like food pantries) by sharing your name, phone, and address so they can reach you? No No    Would you like help connecting to resources? (Please check the appropriate answers) Housing         MENTAL STATUS EXAMINATION:   Mental Status Exam - 12/25/22 0940          Mental Status Exam     General Appearance Not able to observe  Behavior Cooperative;Uncooperative;Guarded     Level of Consciousness Alert     Orientation Level Grossly intact     Attention/Concentration WNL     Mannerisms/Movements No abnormal mannerisms/movements     Speech Quality and Rate WNL     Speech Clarity Clear     Speech Tone Normal vocal inflection      Vocabulary/Fund of Knowledge WNL     Memory Intact     Thought Process & Associations Goal-directed;Linear;Organized;Logical     Suicidal Thoughts None     Homicidal Thoughts None     Mood Irritable;Depressed/Sad     Mood comment "I'm alright."     Affect Congruent with mood     Judgment Not able to observe     Insight Not able to observe                     RISK ASSESSMENTS:       Violence: low (1)     Addiction: low (1)     ASSESSMENT:    Ms. Cheryl Zimmerman is a housed, cisgender, single woman with a documented history of outpatient and inpatient treatment for schizoaffective disorder d/o bipolar type and PTSD. Ms. Garzon reported that she lives alone and when I asked if she had supportive family and friends, she stated, "no."    Ms. Fogelson initially stated that she wanted to talk and she was glad for the call but after a brief conversation about her friend's death, she stated, "why are you calling? It's Sunday." I explained the reason of the call to her and then she stated, "well you can hear I'm alright."    Ms. Pletcher stated that she was "alright." I was unable to complete a full mental status exam due to her abrupt insistence on ending the call.     DIAGNOSES:     Depressive disorder [F32.A]   SNOMED CT(R): DEPRESSIVE DISORDER      PLAN:  Follow-up with providers.    REVIEWING TODAY'S VISIT:             CARE PLAN/ EPISODES:  No linked episodes         Nada Boozer, LICSW

## 2022-12-27 ENCOUNTER — Telehealth (HOSPITAL_BASED_OUTPATIENT_CLINIC_OR_DEPARTMENT_OTHER): Payer: Self-pay | Admitting: Registered Nurse

## 2022-12-27 NOTE — Telephone Encounter (Signed)
-----   Message from [facility] sent at 12/27/2022 11:42 AM EDT -----  Regarding: returning phone call to Dr. Dutch Quint [phone], 52 year old, female    Calls today: Patient returning call to Dr. Otilio Miu whom called 12/25/2022. Pt wanting Dr. Otilio Miu to give a call back .     Person calling on behalf of patient: Patient (self)    CALL BACK NUMBER: [phone]      Patient's language of care: English    Patient does not need an interpreter.    Patient's PCP: [name], MD    Primary Care Home Site:  [facility]

## 2022-12-27 NOTE — Telephone Encounter (Signed)
Pt identified using two forms of identification.  She states for 3 days having intermittent flutter/ palpitation in chest lasting 3-4 minutes.  Oxygen sat 89-91% and now on 2 L of O2.   O2 sat now 92%  "Chest feels heavy, I can't take deep breath and it hurts"  Has gained 6 lbs within 3 days.  "I'm not feeling like myself."  Denies swelling in legs.  Abd area feels swollen and bloated, "arms are getting puffy"  Ringing in ears and numbness and tingling.  Denies dyspnea  She called transportation to pick her up on this Thursday April 11 to take her to [facility] ED.    Pt declined going to any other ED now stating she is on too many medication, she is in a lot of pain, she doesn't trust other providers except Dr. Otilio Miu who is aware of her medical history.  She states taking all her medications as prescribed.  Fluid restriction of one liter of fluid a day "I put myself on a fluid restriction"    RN advised pt not to wait until Thursday to go to ED.  Advised pt to go to nearest ED now. Pt declined. Stating she doesn't trust other providers.  RN advised pt can call 911 for EMS for pt now. Pt declined stating she will not go and has transportation on Thursday. She states she is on over 21 medication a day. "I am eating and drinking fine and I think I'm ok for now"  Reiterated to pt sxs can worsen and best for pt to be evaluated today and not wait until Thursday.  Pt declined again and but states if she gets worse she promise she will call 911 for EMS and not wait until Thursday but doesn't want to go now to ED.  RN advised will route message to Dr. Otilio Miu and again reminded pt of ED precaution, any worsening chest pain, palpitation, shortness of breath to go to nearest ED for evaluation immediately and not wait until Thursday.  Pt voiced understanding, agrees to plan and no further questions at this time.    Weyman Rodney, RN 12/27/22 1:37 PM

## 2022-12-28 ENCOUNTER — Telehealth (HOSPITAL_BASED_OUTPATIENT_CLINIC_OR_DEPARTMENT_OTHER): Payer: Self-pay

## 2022-12-28 NOTE — Telephone Encounter (Addendum)
Ambulance just came as pt has recently been experiencing dyspnea and 6? Or so lb weight gain over last few days or more.    O2 in 80s, (86-88), then went up into 90s, bouncing around there.  Feeling short of breath still.    Last had bumex last Friday (?), [facility]- they said they needed doctors authorization?? Says has been without many meds? Unclear exactly how or what happened. Pt asked this writer to not rx meds now because apparently she already set up a ride to [facility] tomorrow morning to the ED as is not feeling well.    Ambulance was called earlier, pt refused stating she would only come to [facility]. This writer attempted to have pt present to local emergency care tonight though pt declined. Therefore, in light of this and no access to bumex or other diuretic, advised pt to uptitrate her oxygen to 3-4L and/or based on dyspnea. She unfortunately doesn't have a finger oxygen probe/sat monitor.

## 2022-12-28 NOTE — Telephone Encounter (Signed)
Pt states she is having difficulty breathing but transportation ride has confirmed to take her to [facility] ED tomorrow 12/29/22 at 7am.  Again advised pt can call 911 for ambulance/ EMS to take her to ED now due to worsening sxs of shortness of breath.  Pt declined stating "do not call and I am not going today."  She doesn't want to go to any other ED beside [facility] ED stating she doesn't trust anyone else.  Denies palpitation and flutter in chest now.  Her arms feel heavy but denies swelling to ankles and feet  Again advised pt not to wait until tomorrow to got to ED and should go now if having worsening sxs of shortness of breath.  Pt declined again and voiced frustration stating "don't make me repeat myself. I'm not going today"    Advised will send message to provider.    RN Paged Dr. Loretta Plume, RN 12/28/22 5:01 PM

## 2022-12-28 NOTE — Telephone Encounter (Signed)
RN called 911 for ambulance to make sure pt is safe as she is having difficulty breathing per last call and worsening sxs of labored breathing.  Report given, that pt declined EMS and declined going to ED today.  Dispatch states they can't force pt to go to ED but can check on pt to see if she is ok.  Confirmed EMS/ ambulance has been sent to pt's home listed [address], [place], [place]  RN will call pt tomorrow to f/u if she went to ED or not.    Weyman Rodney, RN 12/28/22 5:22 PM

## 2022-12-29 ENCOUNTER — Other Ambulatory Visit: Payer: Self-pay

## 2022-12-29 ENCOUNTER — Inpatient Hospital Stay
Admission: EM | Admit: 2022-12-29 | Discharge: 2022-12-31 | DRG: 292 | Disposition: A | Discharge status: Home / Self Care | Payer: No Typology Code available for payment source | Attending: Internal Medicine | Admitting: Internal Medicine

## 2022-12-29 ENCOUNTER — Emergency Department (HOSPITAL_BASED_OUTPATIENT_CLINIC_OR_DEPARTMENT_OTHER): Payer: No Typology Code available for payment source

## 2022-12-29 ENCOUNTER — Encounter (HOSPITAL_BASED_OUTPATIENT_CLINIC_OR_DEPARTMENT_OTHER): Payer: Self-pay | Admitting: Internal Medicine

## 2022-12-29 DIAGNOSIS — G40909 Epilepsy, unspecified, not intractable, without status epilepticus: Secondary | ICD-10-CM | POA: Diagnosis present

## 2022-12-29 DIAGNOSIS — F1910 Other psychoactive substance abuse, uncomplicated: Secondary | ICD-10-CM | POA: Diagnosis not present

## 2022-12-29 DIAGNOSIS — R079 Chest pain, unspecified: Secondary | ICD-10-CM

## 2022-12-29 DIAGNOSIS — Z20822 Contact with and (suspected) exposure to covid-19: Secondary | ICD-10-CM | POA: Diagnosis not present

## 2022-12-29 DIAGNOSIS — I5033 Acute on chronic diastolic (congestive) heart failure: Principal | ICD-10-CM | POA: Diagnosis present

## 2022-12-29 DIAGNOSIS — I509 Heart failure, unspecified: Secondary | ICD-10-CM | POA: Diagnosis not present

## 2022-12-29 DIAGNOSIS — Z6841 Body Mass Index (BMI) 40.0 and over, adult: Secondary | ICD-10-CM

## 2022-12-29 DIAGNOSIS — Z9981 Dependence on supplemental oxygen: Secondary | ICD-10-CM | POA: Diagnosis not present

## 2022-12-29 DIAGNOSIS — R45851 Suicidal ideations: Secondary | ICD-10-CM | POA: Diagnosis present

## 2022-12-29 DIAGNOSIS — F1120 Opioid dependence, uncomplicated: Secondary | ICD-10-CM | POA: Diagnosis present

## 2022-12-29 DIAGNOSIS — J9601 Acute respiratory failure with hypoxia: Secondary | ICD-10-CM

## 2022-12-29 DIAGNOSIS — J441 Chronic obstructive pulmonary disease with (acute) exacerbation: Secondary | ICD-10-CM

## 2022-12-29 DIAGNOSIS — I4891 Unspecified atrial fibrillation: Secondary | ICD-10-CM | POA: Diagnosis present

## 2022-12-29 DIAGNOSIS — F1490 Cocaine use, unspecified, uncomplicated: Secondary | ICD-10-CM | POA: Diagnosis present

## 2022-12-29 DIAGNOSIS — F2089 Other schizophrenia: Secondary | ICD-10-CM

## 2022-12-29 DIAGNOSIS — F17210 Nicotine dependence, cigarettes, uncomplicated: Secondary | ICD-10-CM

## 2022-12-29 DIAGNOSIS — M4800 Spinal stenosis, site unspecified: Secondary | ICD-10-CM | POA: Diagnosis present

## 2022-12-29 DIAGNOSIS — F250 Schizoaffective disorder, bipolar type: Secondary | ICD-10-CM | POA: Diagnosis present

## 2022-12-29 DIAGNOSIS — J432 Centrilobular emphysema: Secondary | ICD-10-CM | POA: Diagnosis not present

## 2022-12-29 DIAGNOSIS — G4733 Obstructive sleep apnea (adult) (pediatric): Secondary | ICD-10-CM | POA: Diagnosis not present

## 2022-12-29 DIAGNOSIS — F4310 Post-traumatic stress disorder, unspecified: Secondary | ICD-10-CM | POA: Diagnosis present

## 2022-12-29 DIAGNOSIS — F603 Borderline personality disorder: Secondary | ICD-10-CM | POA: Diagnosis present

## 2022-12-29 DIAGNOSIS — J449 Chronic obstructive pulmonary disease, unspecified: Secondary | ICD-10-CM

## 2022-12-29 DIAGNOSIS — G8929 Other chronic pain: Secondary | ICD-10-CM

## 2022-12-29 DIAGNOSIS — R0602 Shortness of breath: Secondary | ICD-10-CM | POA: Diagnosis present

## 2022-12-29 DIAGNOSIS — J9611 Chronic respiratory failure with hypoxia: Secondary | ICD-10-CM | POA: Diagnosis present

## 2022-12-29 DIAGNOSIS — R0902 Hypoxemia: Secondary | ICD-10-CM | POA: Diagnosis not present

## 2022-12-29 DIAGNOSIS — E662 Morbid (severe) obesity with alveolar hypoventilation: Secondary | ICD-10-CM | POA: Diagnosis present

## 2022-12-29 DIAGNOSIS — R918 Other nonspecific abnormal finding of lung field: Secondary | ICD-10-CM | POA: Diagnosis not present

## 2022-12-29 DIAGNOSIS — F259 Schizoaffective disorder, unspecified: Secondary | ICD-10-CM | POA: Diagnosis present

## 2022-12-29 DIAGNOSIS — K219 Gastro-esophageal reflux disease without esophagitis: Secondary | ICD-10-CM | POA: Diagnosis present

## 2022-12-29 DIAGNOSIS — E039 Hypothyroidism, unspecified: Secondary | ICD-10-CM | POA: Diagnosis present

## 2022-12-29 LAB — BASIC METABOLIC PANEL
ANION GAP: 12 mmol/L (ref 10–22)
BUN (UREA NITROGEN): 6 mg/dL — ABNORMAL LOW (ref 7–18)
CALCIUM: 9.5 mg/dL (ref 8.5–10.5)
CARBON DIOXIDE: 28 mmol/L (ref 21–32)
CHLORIDE: 101 mmol/L (ref 98–107)
CREATININE: 0.7 mg/dL (ref 0.4–1.2)
ESTIMATED GLOMERULAR FILT RATE: >60 mL/min (ref >60)
Glucose Random: 113 mg/dL (ref 74–160)
POTASSIUM: 3.9 mmol/L (ref 3.5–5.1)
SODIUM: 141 mmol/L (ref 136–145)

## 2022-12-29 LAB — COMPREHENSIVE METABOLIC PANEL
ALANINE AMINOTRANSFERASE: 26 U/L (ref 12–45)
ALBUMIN: 4.4 g/dL (ref 3.4–5.2)
ALKALINE PHOSPHATASE: 195 U/L — ABNORMAL HIGH (ref 45–117)
ANION GAP: 13 mmol/L (ref 10–22)
ASPARTATE AMINOTRANSFERASE: 29 U/L (ref 8–34)
BILIRUBIN TOTAL: 0.3 mg/dL (ref 0.2–1.0)
BUN (UREA NITROGEN): 6 mg/dL — ABNORMAL LOW (ref 7–18)
CALCIUM: 10 mg/dL (ref 8.5–10.5)
CARBON DIOXIDE: 26 mmol/L (ref 21–32)
CHLORIDE: 99 mmol/L (ref 98–107)
CREATININE: 0.8 mg/dL (ref 0.4–1.2)
ESTIMATED GLOMERULAR FILT RATE: >60 mL/min (ref >60)
Glucose Random: 101 mg/dL (ref 74–160)
POTASSIUM: 4.7 mmol/L (ref 3.5–5.1)
SODIUM: 138 mmol/L (ref 136–145)
TOTAL PROTEIN: 7.8 g/dL (ref 6.4–8.2)

## 2022-12-29 LAB — CBC, PLATELET & DIFFERENTIAL
ABSOLUTE BASO COUNT: 0 10*3/uL (ref 0.0–0.1)
ABSOLUTE EOSINOPHIL COUNT: 0.1 10*3/uL (ref 0.0–0.8)
ABSOLUTE IMM GRAN COUNT: 0.02 10*3/uL (ref 0.00–0.10)
ABSOLUTE LYMPH COUNT: 2.7 10*3/uL (ref 0.6–5.9)
ABSOLUTE MONO COUNT: 0.4 10*3/uL (ref 0.2–1.4)
ABSOLUTE NEUTROPHIL COUNT: 4.4 10*3/uL (ref 1.6–8.3)
ABSOLUTE NRBC COUNT: 0 10*3/uL (ref 0.0–0.0)
BASOPHIL %: 0.1 % (ref 0.0–1.2)
EOSINOPHIL %: 0.9 % (ref 0.0–7.0)
HEMATOCRIT: 50.3 % — ABNORMAL HIGH (ref 34.1–44.9)
HEMOGLOBIN: 16.4 g/dL — ABNORMAL HIGH (ref 11.2–15.7)
IMMATURE GRANULOCYTE %: 0.3 % (ref 0.0–1.0)
LYMPHOCYTE %: 35.2 % (ref 15.0–54.0)
MEAN CORP HGB CONC: 32.6 g/dL (ref 31.0–37.0)
MEAN CORPUSCULAR HGB: 30 pg (ref 26.0–34.0)
MEAN CORPUSCULAR VOL: 92.1 fl (ref 80.0–100.0)
MEAN PLATELET VOLUME: 11.9 fL (ref 8.7–12.5)
MONOCYTE %: 5.1 % (ref 4.0–13.0)
NEUTROPHIL %: 58.4 % (ref 40.0–75.0)
NRBC %: 0 % (ref 0.0–0.0)
PLATELET COUNT: 263 10*3/uL (ref 150–400)
RBC DISTRIBUTION WIDTH STD DEV: 48 fL — ABNORMAL HIGH (ref 35.1–46.3)
RED BLOOD CELL COUNT: 5.46 M/uL — ABNORMAL HIGH (ref 3.90–5.20)
WHITE BLOOD CELL COUNT: 7.5 10*3/uL (ref 4.0–11.0)

## 2022-12-29 LAB — MAGNESIUM: MAGNESIUM: 2.4 mg/dL (ref 1.6–2.6)

## 2022-12-29 LAB — VENOUS BLOOD GAS
VBG POTASSIUM: 4.2 mmol/L (ref 3.5–5.1)
VEN PERCENT OXYGEN SATURATION: 38.3 %
VENOUS BASE EXCESS: 5.7 mmol/L — ABNORMAL HIGH (ref 2–3)
VENOUS BICARBONATE: 32.3 — ABNORMAL HIGH (ref 23–28)
VENOUS FIO2: 21
VENOUS PARTIAL CARBON DIOXIDE: 50 mmHg (ref 41.0–51.0)
VENOUS PARTIAL PRESSURE OXYGEN: 19 mmHG
VENOUS PATIENTS TEMP: 97
VENOUS pH: 7.41 (ref 7.31–7.41)

## 2022-12-29 LAB — TROPONIN T HS 3 HOUR
DELTA 3 HOUR TROPONIN T HS: 1 ng/L (ref 0–7)
TROPONIN T HS 3 HOUR RESULT: 10 ng/L (ref 0–10)

## 2022-12-29 LAB — TROPONIN T HS BASELINE: TROPONIN T HS BASELINE: 11 ng/L — ABNORMAL HIGH (ref 0–10)

## 2022-12-29 LAB — NT-PROBNP: NT-proBNP: 48 pg/mL (ref 0–125)

## 2022-12-29 LAB — RESPIRATORY PANEL BASIC INPAT
INFLUENZA A: NEGATIVE
INFLUENZA B: NEGATIVE
RESPIRATORY SYNCYTIAL VIRUS: NEGATIVE
SARS-COV-2: NEGATIVE

## 2022-12-29 LAB — D-DIMER PE/DVT, QUANTITATIVE: D-DIMER PE/DVT, QUANTITATIVE: 497 ng/mLFEU (ref 0.00–499)

## 2022-12-29 LAB — TROPONIN T HS 1 HOUR
DELTA 1 HOUR TROPONIN T HS: 1 ng/L (ref 0–4)
TROPONIN T HS 1 HOUR RESULT: 10 ng/L (ref 0–10)

## 2022-12-29 LAB — PHOSPHORUS MAGNESIUM
MAGNESIUM: 2 mg/dL (ref 1.6–2.6)
PHOSPHORUS: 3.2 mg/dL (ref 2.5–4.9)

## 2022-12-29 MED ORDER — FLUOXETINE HCL 20 MG PO CAPS
60.0000 mg | ORAL_CAPSULE | Freq: Every day | ORAL | Status: DC
Start: 2022-12-29 — End: 2022-12-31
  Administered 2022-12-29 – 2022-12-31 (×3): 60 mg via ORAL
  Filled 2022-12-29 (×3): qty 3

## 2022-12-29 MED ORDER — BUMETANIDE 0.25 MG/ML IJ SOLN
2.0000 mg | Freq: Once | INTRAMUSCULAR | Status: DC
Start: 2022-12-29 — End: 2022-12-29
  Filled 2022-12-29 (×2): qty 8

## 2022-12-29 MED ORDER — METHOCARBAMOL 500 MG PO TABS
750.0000 mg | ORAL_TABLET | Freq: Three times a day (TID) | ORAL | Status: DC
Start: 2022-12-29 — End: 2022-12-29
  Administered 2022-12-29: 750 mg via ORAL
  Filled 2022-12-29: qty 2

## 2022-12-29 MED ORDER — HEPARIN SODIUM (PORCINE) 5000 UNIT/ML IJ SOLN
5000.0000 [IU] | Freq: Three times a day (TID) | INTRAMUSCULAR | Status: DC
Start: 2022-12-29 — End: 2022-12-31
  Administered 2022-12-29 – 2022-12-30 (×3): 5000 [IU] via SUBCUTANEOUS
  Filled 2022-12-29 (×5): qty 1

## 2022-12-29 MED ORDER — NICOTINE POLACRILEX 2 MG MT GUM
4.0000 mg | CHEWING_GUM | OROMUCOSAL | Status: DC | PRN
Start: 2022-12-29 — End: 2022-12-31
  Administered 2022-12-29 – 2022-12-31 (×7): 4 mg via ORAL
  Filled 2022-12-29 (×7): qty 2

## 2022-12-29 MED ORDER — BUPRENORPHINE HCL-NALOXONE HCL 8-2 MG SL SUBL
8.0000 mg | SUBLINGUAL_TABLET | Freq: Three times a day (TID) | SUBLINGUAL | Status: DC
Start: 2022-12-29 — End: 2022-12-31
  Administered 2022-12-29 – 2022-12-31 (×7): 1 via SUBLINGUAL
  Filled 2022-12-29 (×7): qty 1

## 2022-12-29 MED ORDER — BUSPIRONE HCL 10 MG PO TABS
20.0000 mg | ORAL_TABLET | Freq: Three times a day (TID) | ORAL | Status: DC
Start: 2022-12-29 — End: 2022-12-31
  Administered 2022-12-29 – 2022-12-31 (×7): 20 mg via ORAL
  Filled 2022-12-29 (×7): qty 2

## 2022-12-29 MED ORDER — BACLOFEN 10 MG PO TABS
10.0000 mg | ORAL_TABLET | Freq: Two times a day (BID) | ORAL | Status: DC
Start: 2022-12-29 — End: 2022-12-29

## 2022-12-29 MED ORDER — LEVETIRACETAM 500 MG PO TABS
750.0000 mg | ORAL_TABLET | Freq: Every day | ORAL | Status: DC
Start: 2022-12-29 — End: 2022-12-29
  Administered 2022-12-29: 750 mg via ORAL
  Filled 2022-12-29: qty 1

## 2022-12-29 MED ORDER — MIRTAZAPINE 7.5 MG PO TABS
7.5000 mg | ORAL_TABLET | Freq: Every evening | ORAL | Status: DC
Start: 2022-12-29 — End: 2022-12-31
  Administered 2022-12-29 – 2022-12-30 (×2): 7.5 mg via ORAL
  Filled 2022-12-29 (×2): qty 1

## 2022-12-29 MED ORDER — NICOTINE 21 MG/24HR TD PT24
1.0000 | MEDICATED_PATCH | Freq: Every day | TRANSDERMAL | Status: DC
Start: 2022-12-29 — End: 2022-12-31
  Administered 2022-12-29 – 2022-12-31 (×3): 1 via TRANSDERMAL
  Filled 2022-12-29 (×3): qty 1

## 2022-12-29 MED ORDER — IPRATROPIUM-ALBUTEROL 0.5-2.5 (3) MG/3ML IN SOLN
9.0000 mL | Freq: Once | RESPIRATORY_TRACT | Status: AC
Start: 2022-12-29 — End: 2022-12-29
  Administered 2022-12-29: 9 mL via RESPIRATORY_TRACT
  Filled 2022-12-29: qty 9

## 2022-12-29 MED ORDER — POLYETHYLENE GLYCOL 3350 17 G PO PACK
17.0000 g | PACK | Freq: Every day | ORAL | Status: DC
Start: 2022-12-29 — End: 2022-12-31
  Administered 2022-12-29 – 2022-12-30 (×2): 17 g via ORAL
  Filled 2022-12-29 (×3): qty 1

## 2022-12-29 MED ORDER — BUMETANIDE 0.25 MG/ML IJ SOLN
4.00 mg | Freq: Once | INTRAMUSCULAR | Status: AC
Start: 2022-12-29 — End: 2022-12-29
  Administered 2022-12-29: 4 mg via INTRAVENOUS
  Filled 2022-12-29: qty 16

## 2022-12-29 MED ORDER — MELATONIN 3 MG PO TABS
5.0000 mg | ORAL_TABLET | Freq: Every evening | ORAL | Status: DC
Start: 2022-12-29 — End: 2022-12-29

## 2022-12-29 MED ORDER — FUROSEMIDE 10 MG/ML IJ SOLN
40.00 mg | Freq: Once | INTRAMUSCULAR | Status: AC
Start: 2022-12-29 — End: 2022-12-29
  Administered 2022-12-29: 40 mg via INTRAVENOUS
  Filled 2022-12-29: qty 4

## 2022-12-29 MED ORDER — BUPRENORPHINE HCL-NALOXONE HCL 8-2 MG SL SUBL
8.0000 mg | SUBLINGUAL_TABLET | Freq: Every day | SUBLINGUAL | Status: DC
Start: 2022-12-29 — End: 2022-12-29
  Administered 2022-12-29: 1 via SUBLINGUAL
  Filled 2022-12-29: qty 1

## 2022-12-29 MED ORDER — BUDESONIDE-FORMOTEROL FUMARATE 160-4.5 MCG/ACT IN AERO
2.0000 | INHALATION_SPRAY | Freq: Two times a day (BID) | RESPIRATORY_TRACT | Status: DC
Start: 2022-12-29 — End: 2022-12-31
  Filled 2022-12-29: qty 6

## 2022-12-29 MED ORDER — ATORVASTATIN CALCIUM 80 MG PO TABS
80.0000 mg | ORAL_TABLET | Freq: Every day | ORAL | Status: DC
Start: 2022-12-29 — End: 2022-12-31
  Administered 2022-12-29 – 2022-12-31 (×3): 80 mg via ORAL
  Filled 2022-12-29 (×3): qty 1

## 2022-12-29 MED ORDER — METHOCARBAMOL 500 MG PO TABS
750.0000 mg | ORAL_TABLET | Freq: Every day | ORAL | Status: DC
Start: 2022-12-30 — End: 2022-12-30
  Administered 2022-12-30: 750 mg via ORAL
  Filled 2022-12-29: qty 2

## 2022-12-29 MED ORDER — LEVETIRACETAM 500 MG PO TABS
750.0000 mg | ORAL_TABLET | Freq: Two times a day (BID) | ORAL | Status: DC
Start: 2022-12-29 — End: 2022-12-31
  Administered 2022-12-29 – 2022-12-31 (×4): 750 mg via ORAL
  Filled 2022-12-29 (×5): qty 1

## 2022-12-29 MED ORDER — GABAPENTIN 300 MG PO CAPS
600.0000 mg | ORAL_CAPSULE | Freq: Two times a day (BID) | ORAL | Status: DC
Start: 2022-12-29 — End: 2022-12-31
  Administered 2022-12-29 – 2022-12-31 (×4): 600 mg via ORAL
  Filled 2022-12-29 (×4): qty 2

## 2022-12-29 MED ORDER — BACLOFEN 10 MG PO TABS
10.0000 mg | ORAL_TABLET | Freq: Two times a day (BID) | ORAL | Status: DC
Start: 2022-12-30 — End: 2022-12-31
  Administered 2022-12-30 – 2022-12-31 (×3): 10 mg via ORAL
  Filled 2022-12-29 (×3): qty 1

## 2022-12-29 MED ORDER — BUPRENORPHINE HCL-NALOXONE HCL 8-2 MG SL SUBL
8.0000 mg | SUBLINGUAL_TABLET | Freq: Every day | SUBLINGUAL | Status: DC
Start: 2022-12-29 — End: 2022-12-29

## 2022-12-29 MED ORDER — BUPRENORPHINE HCL-NALOXONE HCL 8-2 MG SL SUBL
8.0000 mg | SUBLINGUAL_TABLET | Freq: Three times a day (TID) | SUBLINGUAL | Status: DC
Start: 2022-12-29 — End: 2022-12-29

## 2022-12-29 MED ORDER — ACETAMINOPHEN 325 MG PO TABS
650.0000 mg | ORAL_TABLET | Freq: Four times a day (QID) | ORAL | Status: DC | PRN
Start: 2022-12-29 — End: 2022-12-31
  Administered 2022-12-30: 650 mg via ORAL
  Filled 2022-12-29: qty 2

## 2022-12-29 MED ORDER — HEPARIN SODIUM (PORCINE) 5000 UNIT/ML IJ SOLN
5000.0000 [IU] | Freq: Two times a day (BID) | INTRAMUSCULAR | Status: DC
Start: 2022-12-29 — End: 2022-12-29

## 2022-12-29 MED ORDER — OXCARBAZEPINE 150 MG PO TABS
150.0000 mg | ORAL_TABLET | Freq: Two times a day (BID) | ORAL | Status: DC
Start: 2022-12-29 — End: 2022-12-31
  Administered 2022-12-29 – 2022-12-31 (×4): 150 mg via ORAL
  Filled 2022-12-29 (×4): qty 1

## 2022-12-29 MED ORDER — LEVOTHYROXINE SODIUM 25 MCG PO TABS
25.0000 ug | ORAL_TABLET | Freq: Every morning | ORAL | Status: DC
Start: 2022-12-30 — End: 2022-12-31
  Administered 2022-12-30 – 2022-12-31 (×2): 25 ug via ORAL
  Filled 2022-12-29 (×2): qty 1

## 2022-12-29 MED ORDER — POTASSIUM CHLORIDE CRYS ER 20 MEQ PO TBCR
40.00 meq | EXTENDED_RELEASE_TABLET | Freq: Once | ORAL | Status: AC
Start: 2022-12-29 — End: 2022-12-29
  Administered 2022-12-29: 40 meq via ORAL
  Filled 2022-12-29: qty 2

## 2022-12-29 MED ORDER — ALBUTEROL SULFATE (2.5 MG/3ML) 0.083% IN NEBU
2.50 mg | INHALATION_SOLUTION | Freq: Once | RESPIRATORY_TRACT | Status: AC
Start: 2022-12-29 — End: 2022-12-29
  Administered 2022-12-29: 2.5 mg via RESPIRATORY_TRACT
  Filled 2022-12-29: qty 3

## 2022-12-29 MED ORDER — PANTOPRAZOLE SODIUM 40 MG PO TBEC
40.0000 mg | DELAYED_RELEASE_TABLET | Freq: Every day | ORAL | Status: DC
Start: 2022-12-29 — End: 2022-12-31
  Administered 2022-12-29 – 2022-12-31 (×3): 40 mg via ORAL
  Filled 2022-12-29 (×3): qty 1

## 2022-12-29 MED ORDER — MELATONIN 3 MG PO TABS
9.0000 mg | ORAL_TABLET | Freq: Every evening | ORAL | Status: DC
Start: 2022-12-29 — End: 2022-12-31
  Administered 2022-12-29 – 2022-12-30 (×2): 9 mg via ORAL
  Filled 2022-12-29 (×2): qty 3

## 2022-12-29 MED ORDER — SPIRONOLACTONE 100 MG PO TABS
100.0000 mg | ORAL_TABLET | Freq: Every day | ORAL | Status: DC
Start: 2022-12-29 — End: 2022-12-31
  Administered 2022-12-29 – 2022-12-31 (×3): 100 mg via ORAL
  Filled 2022-12-29 (×3): qty 1

## 2022-12-29 NOTE — ED Triage Note (Signed)
Pt presents to ED c/o SOB x 1 week. Pt reports history of COPD/CHF - on O2 as needed and at night. Pt reports intermittent "fluttering in chest" x 3 days & chest pressure. Reports nausea. Pt spoke to PCP who advised increasing O2 to 3L NC at night. Advised to come to ED for eval as patient reports weight gain of 6lbs in 3 days. Resp labored - speaking ine two/three word sentences.

## 2022-12-29 NOTE — Plan of Care (Signed)
A+Ox3, agitated, uncooperative with care.  Pt denies chest pan, SOB, pain. Refusing head to toe assessment, but is agreeable to taking most medications. Pt refused Heparin however. Wheelchair use @ baseline, but able to transfer to commode at bedside w/ assist.  NSR on tele, 94% O2 on 2L via NC (baseline home O2). Call bell within reach, bed low and locked, bed alarm active, 2L fluid restriction in effect, low sodium diet maintained, safety maintained, will continue with plan of care.    Problem: Fall risk identification/communication (REQUIRED)  Description: REQUIRED if Fall Risk score is 5 or greater  Goal: Patients at risk for falling will be known to staff  Description: Fall risk assessment to be done:       Upon Admission       Transfer from one level of care to another       Whenever there is a change in patient status       After every fall event   Outcome: Progressing     Problem: Altered Mobility  Goal: Reduce fall/injury risk related to altered mobility  Outcome: Progressing     Problem: COPD  Goal: Patient will maintain patent airway  Outcome: Progressing     Problem: Inadequate Airway Clearance  Goal: Patient will maintain patent airway  Description: Assess and monitor breath sounds, cough and sputum (if present), and intake/output. Collaborate with respiratory therapy to administer medications and treatments.   Outcome: Progressing     Problem: Hemodynamic Status  Goal: Patient has stable vital signs and fluid balance  Description: Assess and monitor patient's heart rate, rhythm, respiratory rate, peripheral pulses, capillary refill, color, body temperature, intake and output, labs and physical activity tolerance.   Observe for signs of chest pain (note location, duration, severity, radiation and associated symptoms such as diaphoresis, nausea, indigestion).  Monitor for signs and symptoms of heart failure (eg. shortness of breath, edema of feet/ankles/legs, rapid irregular heart rate, coughing,  wheezing, white/pink blood tinged sputum, sudden weight gain, chest pain). Collaborate with interdisciplinary team and initiate plan and interventions as ordered.  Outcome: Progressing     Problem: Activity Intolerance/Impaired Mobility  Goal: Mobility/activity is maintained at optimum level for patient  Description: Assess and monitor patient  barriers to mobility and need for assistive/adaptive devices. Assess patient's emotional response to limitations. Collaborate with interdisciplinary team and initiate plans and interventions as ordered.  Outcome: Progressing

## 2022-12-29 NOTE — Narrator Note (Signed)
Pt desat to 88%, O2 increased to 4L via NC

## 2022-12-29 NOTE — ED Provider Notes (Signed)
[facility] EMERGENCY MEDICINE ATTENDING NOTE     Triage Documentation       Cheryl Carrow, RN 12/29/2022 09:02                 Pt presents to ED c/o SOB x 1 week. Pt reports history of COPD/CHF - on O2 as needed and at night. Pt reports intermittent "fluttering in chest" x 3 days & chest pressure. Reports nausea. Pt spoke to PCP who advised increasing O2 to 3L NC at night. Advised to come to ED for eval as patient reports weight gain of 6lbs in 3 days. Resp labored - speaking ine two/three word sentences.            History of Present Illness:    Cheryl Zimmerman is a 52 year old female who presents to the ED with progressive shortness of breath for the last week.  Says she is stopped taking her Bumex because there was an issue with insurance about a week ago as well has increased 6 pound weight gain and dyspnea on exertion as well as fluttering in her chest and chest pressure.  Has known history of COPD and CHF.  No fever or chills.  No nausea or vomiting.  Does think she has had increased swelling of her legs     has a past medical history of Bipolar affective ([name]) (schizo), Chronic systolic CHF (congestive heart failure) ([name]), Depression, Fall in home (12/23/2021), Gait instability (12/23/2021), Requires continuous at home supplemental oxygen (12/23/2021), Schizo affective schizophrenia ([name]) (depress), Spinal stenosis of lumbar 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 claudication (12/23/2021), and Substance abuse ([name]).     Cheryl Zimmerman   MRN: [id number]  PCP: [name], MD    Arrived to the ED by: Self    History provided by: The patient    External records reviewed: n/a           Physical Examination and Testing     ED Triage Vitals [12/29/22 0900]   ED Triage Vitals Brief Group      Temp 97 F      Pulse 82      Resp (!) 30      BP 140/82      SpO2 99 %      Pain Score 0       XR Chest Portable   Final Result        Atelectasis or mild infiltrate in the right base.            Reviewed and Electronically Signed By: Lavella Lemons,  MD    Signed Date and Time: 12/29/2022 9:35 AM         Labs Reviewed   CBC, PLATELET & DIFFERENTIAL - Abnormal; Notable for the following components:       Result Value    RED BLOOD CELL COUNT 5.46 (*)     HEMOGLOBIN 16.4 (*)     HEMATOCRIT 50.3 (*)     RBC DISTRIBUTION WIDTH STD DEV 48.0 (*)     All other components within normal limits    Narrative:     Current Anticoagulant->None  Is this D-Dimer needed to rule out Pulmonary Embolism or  DVT?->Yes   COMPREHENSIVE METABOLIC PANEL - Abnormal; Notable for the following components:    BUN (UREA NITROGEN) 6 (*)     ALKALINE PHOSPHATASE 195 (*)     All other components within normal limits   TROPONIN T HS BASELINE - Abnormal; Notable for the following components:  TROPONIN T HS BASELINE 11 (*)     All other components within normal limits   VENOUS BLOOD GAS - Abnormal; Notable for the following components:    VENOUS BASE EXCESS 5.7 (*)     VENOUS BICARBONATE 32.3 (*)     All other components within normal limits    Narrative:     Draw on Room Air or O2?->On Oxygen   MAGNESIUM   NT-PROBNP   D-DIMER PE/DVT, QUANTITATIVE    Narrative:     Current Anticoagulant->None  Is this D-Dimer needed to rule out Pulmonary Embolism or  DVT?->Yes   TROPONIN T HS 1 HOUR   TROPONIN T HS 3 HOUR   RESPIRATORY PANEL BASIC INPAT        General: Mildly anxious, does not appear to be uncomfortable breathing rapid, on supplemental oxygen, obese  Skin: Warm and dry  HEENT: Normocephalic, atraumatic  Eyes:  Anicteric, extraocular  movements intact  Neck: Supple, trachea midline  CV:  Well-perfused  Chest: Tachypnea, diminished bilaterally though auscultation limited by habitus a little but bedside ultrasound a few B-lines on the left but no prominent B-lines on the right.  Audible expiratory wheeze bilateral  Abdomen:  Nondistended  Extremities: No deformities, bilateral 2+ pitting edema  Neuro: Alert,oriented,  fluent speech, moving all extremities, following commands  Psych: Normal mood,  cooperative, organized thought process                  ED Course and Medical Decision Making     MDM/ED COURSE  Medications   furosemide (LASIX) injection 40 mg (40 mg Intravenous Given 12/29/22 0933)   ipratropium-albuterol (DUO-NEB) 0.5-2.5 (3) MG/3ML nebulizer solution 9 mL (9 mLs Nebulization Given 12/29/22 0945)   albuterol (PROVENTIL) nebulizer solution 2.5 mg (2.5 mg Nebulization Given 12/29/22 1146)       Treatment significantly limited by the following social determinants of health:  None     52 year old female with shortness of breath.    History from report or electronic record notable for COPD on supplemental oxygen as needed at night, CHF, recent disruption of diuretic medication axis, obesity, A-fib not on anticoagulation  External sources reviewed: n/a    Here breathing is labored speaking in shorter sentences on 2 L supplemental oxygen  Triage vital signs tachypneic with respiratory rate 30.  She is afebrile.  Not tachycardic.  Blood pressure is about 140/82.  Satting 99-100 on 2 L  Exam notable for expiratory wheeze, labored breathing and obese habitus.  She does have peripheral edema and with reported increased weight gain    Differentials considered include: CHF exacerbation in the setting of medication interruption, COPD exacerbation, cardiac ischemia, arrhythmia, less likely infection.        UPDATE:    12/29/22  1031 12/29/22  1101 12/29/22  1131 12/29/22  1146   BP: 140/79 147/70 165/84    Pulse: 73 84 91 89   Resp: (!) Temp:       SpO2: 94% 96% 99%    Weight:         ED Course as of 12/29/22 1225   Thu Dec 29, 2022   1223 Discussed with admitting team agrees with plan for admission.  Patient at this point has put out about 600 to 700 cc urine   1137 TTE from 03/2022:    Narrative & Impression  Conclusions: (click link below for full report)  1. Left Ventricle: Global systolic  function: EF is estimated visually at 65% .  2. 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 The remaining left ventricular segments contract normally.  3. Right Ventricle: Moderately dilated. Systolic function is normal.  4. Tricuspid Valve: There is insufficient regurgitation to estimate RVSP.  5. 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  6. As compared to the previous echocardiogram on 12/07/2021, the ascending aorta was found dilated at 3.7 cm from normal. Otherwise, no obvious changes.       1135 Checked in on patient.  She does appear little more comfortable.  However she has a 4 L oxygen requirement when her baseline is 2 L as needed at night.  She still seems like she is having some work of breathing.  No fever or overt infectious etiology.  D-dimer makes this seem unlikely to be a blood clot.  Did receive some DuoNeb treatment but may benefit from an additional round.  Has been putting out urine to Lasix.  Particularly given that she is also had a disruption with her diuretic access in the outpatient setting and her persistent symptoms with an oxygen requirement warrants admission for further management   1057 D-dimer within normal limits electrolytes within normal limits baseline troponin 11.  Continuing to trend   1026 Patient with desat to 88%.  Repositioned with improvement.   1010 Tachypnea improved   1010 X-ray showing atelectasis or mild infiltrate at the right base   1010 No leukocytosis.       Independently interpreted by ED provider: ECG  Discussed with other providers: N/a  Total Critical Care Time: 0        Patient admitted to medicine for further management    Disposition     Impression(s):  COPD exacerbation ([name])  Acute on chronic heart failure, unspecified heart failure type ([name])  Hypoxia    Condition:  Stable    Disposition:  Admit To [place]    Signed by Dr. Electa Sniff, MD, [facility]  Emergency Medicine Attending  [facility]    The patient was seen primarily by me.  Nursing record was reviewed. Select prior records as available electronically through the Epic record were reviewed. Please see electronic record for additional detail about any labs, imaging, and diagnostic results related to this visit. This patient encounter note was created using voice-recognition software. Please excuse any typographical errors that have not yet been reviewed and corrected.

## 2022-12-29 NOTE — Narrator Note (Signed)
Attempted report to [facility]

## 2022-12-29 NOTE — Progress Notes (Addendum)
Pt arrived from emergency department alert and oriented agitated and verbally abusive. Pt refuse to take  meds and refuse admitting assessment, MD aware.

## 2022-12-29 NOTE — H&P (Signed)
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  Denies any recent illnesses, URI symptoms, fevers chills.  Denies any wheezing or chest tightness.  Continued everyday smoker.    REVIEW OF SYSTEMS:  Negative except as noted in HPI.    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     (HFpEF) heart failure with preserved ejection fraction ([name])     MDD (major depressive disorder), recurrent, severe, with psychosis ([name])     Requires continuous at home supplemental oxygen     Spinal stenosis of lumbar 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 claudication     Fall in home     Gait instability     Hypokalemia     Lung nodule     Stable angina     Chest pain, atypical     Obesity hypoventilation syndrome ([name])     Osteoarthritis of spine with radiculopathy, lumbar region     Chronic bilateral low back pain with bilateral sciatica     Hypoxia        PAST SURGICAL HISTORY:   No past surgical  history on file.      SOCIAL HISTORY:  Lives with: Lives in [place] by herself  Tobacco: 2 pack/daily   Alcohol: None currently  Drugs: Denies   Social History    Social History Narrative      Not on file      FAMILY HISTORY:  No relevant Fhx.     ALLERGIES:   Review of Patient's Allergies indicates:   Bee venom               Anaphylaxis   Metolazone              Other (See Comments)    Comment:Hypokalemia   Trazodone               Hives   Methylprednisolone      Dizziness, Drowsiness    Comment:Also believes syncope. Seems to tolerate             inhalational and epidural steroid.   Nutritional supplem*       Tegretol [carbamaze*    Hives   Hydroxyzine  Nausea Only    MEDICATIONS PRIOR TO ADMISSION:   Prior to Admission Medications   Prescriptions Last Dose Informant Patient Reported? Taking?   FLUoxetine (PROZAC) 20 MG capsule   No No   Sig: Take 1 capsule by mouth in the morning. Please take with  capsules for a total of  daily.   FLUoxetine (PROZAC) 40 MG capsule   No No   Sig: Take 1 capsule by mouth in the morning. Please take with  capsules for a total of  daily.   Melatonin 5 MG TABS Tablet   Yes No   Sig: Take 5 mg by mouth nightly   Misc. Devices ([employer] PULSE OXIMETER) MISC   No No   Sig: Use 1 each As Directed in the morning.   Multiple Vitamin (MULTIVITAMIN) tablet   No No   Sig: Take 1 tablet by mouth in the morning.   OTHER MEDICATION   No No   Sig: 1 each by Other route in the morning. Wheelchair: Bariatric motorized wheel chare (height 5'9", weight 328 lb, bmi 48.4)  For use as directed.    Please fax to [facility] at [phone]  DX: Spinal stenosis of lumbar 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 claudication, Gait instability, Morbid obesity ([name])  ICD10:M48.061, R26.81, E66.01.   OXcarbazepine (TRILEPTAL) 150 MG tablet   No No   Sig: Take 1 tablet by mouth in the morning and 1 tablet before bedtime.   Potassium Chloride ER 20 MEQ TBCR   No No   Sig: Take 20 mEq by mouth in the morning.    acetaminophen (TYLENOL) 325 MG tablet   Yes No   Sig: Take 2 tablets by mouth every 6 (six) hours as needed for Pain   atorvastatin (LIPITOR) 80 MG tablet   No No   Sig: TAKE 1 TABLET BY MOUTH EVERY DAY IN THE MORNING   baclofen (LIORESAL) 10 MG tablet   Yes No   Sig: Take 1 tablet by mouth in the morning and 1 tablet before bedtime.   budesonide-formoterol (SYMBICORT) 160-4.5 MCG/ACT inhaler   Yes No   Sig: Inhale 2 puffs into the lungs in the morning and 2 puffs before bedtime.   bumetanide (BUMEX) 1 MG tablet   No No   Sig: Take 4 tablets by mouth in the morning.   busPIRone (BUSPAR) 10 MG tablet   Yes No   Sig: Take 2 tablets by mouth in the morning and 2 tablets at noon and 2 tablets before bedtime.   diclofenac (VOLTAREN) 1 % GEL Gel   Yes No   Sig: Apply 2 g topically in the morning and 2 g at noon and 2 g in the evening and 2 g before bedtime.   fluticasone (FLONASE) 50 MCG/ACT nasal spray   Yes No   Sig: 2 sprays by Each Nostril route in the morning and 2 sprays before bedtime.   gabapentin (NEURONTIN) 600 MG tablet   No No   Sig: Take 1 tablet by mouth in the morning and 1 tablet before bedtime.   hydrOXYzine (ATARAX) 25 MG tablet   Yes No   Sig: Take 25 mg by mouth every 6 (six) hours as needed for Anxiety   levETIRAcetam (KEPPRA) 750 MG tablet   No No   Sig: TAKE 1 TABLET BY MOUTH EVERY MORNING AND 1 TABLET BEFORE BEDTIME   levothyroxine (SYNTHROID) 25 MCG tablet   No No   Sig: TAKE 1 TABLET BY MOUTH EVERY DAY IN  THE MORNING BEFORE BREAKFAST   methocarbamol (ROBAXIN) 500 MG tablet   No No   Sig: Take 1.5 tablets by mouth in the morning and 1.5 tablets at noon and 1.5 tablets before bedtime.   mirtazapine (REMERON) 7.5 MG tablet   Yes No   Sig: Take 7.5 mg by mouth nightly   nicotine (NICODERM CQ) 14 MG/24HR   No No   Sig: Place 1 patch onto the skin in the morning for 14 days.   nicotine (NICODERM CQ) 21 MG/24HR   No No   Sig: Place 1 patch onto the skin in the morning.   nicotine (NICODERM CQ) 7  MG/24HR   No No   Sig: Place 1 patch onto the skin in the morning for 14 days.   nicotine polacrilex (NICORETTE) 4 MG gum   No No   Sig: Take 1 each by mouth as needed for Craving (Nicotine)   pantoprazole (PROTONIX) 40 MG tablet   Yes No   Sig: Take 1 tablet by mouth in the morning.   polyethylene glycol (GLYCOLAX/MIRALAX) 17 g packet   No No   Sig: Take 1 packet by mouth in the morning.   prazosin (MINIPRESS) 2 MG capsule   Yes No   Sig: Take 1 capsule by mouth nightly   spironolactone (ALDACTONE) 100 MG tablet   No No   Sig: TAKE 1 TABLET BY MOUTH EVERY DAY IN THE MORNING   thiamine (VITAMIN B-1) 100 mg tablet   Yes No   Sig: Take 100 mg by mouth in the morning.      Facility-Administered Medications: None         VITALS:   12/29/22  1131 12/29/22  1146 12/29/22  1324 12/29/22  1409   BP: 165/84  114/83 146/78   Pulse: 91 89 85 89   Resp: 12  (!) 26 20   Temp:    98.1 F (36.7 C)   TempSrc:    Oral   SpO2: 99%  96% 97%   Weight:    (!) 158 kg (348 lb 4.8 oz)       Intake/Output last 24hours (7a-7a):   I/O 24 Hrs:  In: -   Out: 750 [Urine:750]    PHYSICAL EXAM:  GEN: No acute distress. Sitting/lying comfortably. Well developed, well nourished.   Neck: Supple. Trachea midline, No cervical lymphadenopathy.  Hard to assess JVP  CV: Regular rate and rhythm. S1 and S2 normal.   Pulm: Clear to auscultation bilaterally.  No wheezing  Abdomen: Normoactive bowel sounds. Soft and non-distended.   Extremities: Well perfused and warm.  Lower extremity edema 2+ bilaterally  skin: No lesions, rashes, ecchymosis, or petechiae.   Psych: Normal mood and affect.  Neuro: Alert and oriented x4. CN2-12 grossly intact. No focal deficits.    RECENT LABS:    Chemistries:  Recent Labs     12/29/22  0929   NA 138   K 4.7   CO2 26   BUN 6*   CREAT 0.8   CA 10.0   MG 2.4   ANION 13   GFR > 60    GI:  Recent Labs     12/29/22  0929   AST 29   ALT 26   TBILI 0.3   ALKPHOS 195*   ALBUMIN 4.4      CBC:  Recent Labs     12/29/22  0929   WBC 7.5    HGB 16.4*   HCT 50.3*  PLTA 263    Coags:   No results for input(s): "INR", "PT", "APTT", "HPTT", "FIB" in the last 72 hours.   Trops, BNP, D-dimer, Lactate, C-RP, Procal, CK:  Recent Labs     12/29/22  0929 12/29/22  1022 12/29/22  1230   TROPTHS 11* 10 10   TROPTHSDELTA  --  1 1   PROBNP 48  --   --    DDPEDVT 497  --   --        Finger Sticks:  No results for input(s): "FINGERSTICKR" in the last 72 hours. Urinalysis:  No results for input(s): "UACOL", "UACLA", "UAGLU", "UABIL", "UAKET", "SPEGRAVURINE", "UAOCC", "UAPH", "UAPRO", "UABAC", "UARBC", "UAWBC", "UANIT", "LEUKOCYTES", "UAMIC", "UASQE" in the last 72 hours.    Invalid input(s): "UAOBU"           Other notable labs:       Microbiology:  Respiratory Panel  Lab Results   Component Value Date    COVID19  12/29/2022     Negative for SARS-CoV-2(2019 novel coronavirus)by PCR  methodology.  Negative results do not preclude 2019-nCoV infection and  should not be used as the sole basis for patient management  decisions. Negative results must be combined with clinical  observations, patient history, and epidemiological  information.  This test has been authorized by the FDA under an Emergency  Use Authorization (EUA) for use by authorized laboratories.      COVID19  12/10/2022     Negative for SARS-CoV-2(2019 novel coronavirus)by PCR  methodology.  Negative results do not preclude 2019-nCoV infection and  should not be used as the sole basis for patient management  decisions. Negative results must be combined with clinical  observations, patient history, and epidemiological  information.  This test has been authorized by the FDA under an Emergency  Use Authorization (EUA) for use by authorized laboratories.      Lab Results   Component Value Date    FLUA  12/29/2022     Negative for Influenza A by PCR methodology.  This test has been authorized by the FDA under an Emergency  Use Authorization (EUA) for use by authorized laboratories.      FLUB  12/29/2022      Negative for influenza B by PCR methodology.  This test has been authorized by the FDA under an Emergency  Use Authorization (EUA) for use by authorized laboratories.      RSV  12/29/2022     Negative for Respiratory Syncytial Virus by a PCR method.  This test has been authorized by the FDA under an Emergency  Use Authorization (EUA) for use by authorized laboratories.            Culture Data  Blood Cultures  No results found for: "BLCA"  No results found for: "BLCAN"    Urine Cultures  No results found for: "UC"    Respiratory Cultures  No results found for: "RC" Gram Stain  Lab Results   Component Value Date    GS  06/04/2011      GRAM STAIN MODERATE RED BLOOD CELLS FEW POLYMORPHONUCLEAR LEUKOCYTES RARE   TO FEW GRAM POSITIVE COCCI IN PAIRS       Other Cultures:  No results found for: "WC"  No results found for: "BFC"     Other Micro  No results found for: "STREPAG"  No results found for: "MRSASAPCR"  No results found for: "CDIFFTOX"         IMAGING:   XR Chest  Portable    Result Date: 12/29/2022  TECHNIQUE: Portable chest, 9:25 a.m. Indication: Shortness of breath Comparison: 08/22/2022 FINDINGS: Quality: Satisfactory.   Tubes/lines: None. Lungs: There is atelectasis or faint infiltrate in the right base. The left lung field is clear. Pleura: There is no pleural effusion or pneumothorax. Heart: The cardiac silhouette is unremarkable. Mediastinum/hila: Unremarkable. Bones and Soft Tissues: There are degenerative changes of the thoracic spine.       Atelectasis or mild infiltrate in the right base. Reviewed and Electronically Signed By: Lavella Lemons, MD Signed Date and Time: 12/29/2022 9:35 AM       EKG: Sinus rhythm    Echo 03/2022:  1. Left Ventricle: Global systolic function: EF is estimated visually at 65% .  2. Left Ventricle: Regional systolic function: Wall motion: Very small and focal area of dyskinesis/hypokinesis of basal inferior wall seen on the contrast images 54-55 only (appearance suggestive of a "  myocardial crypt"), present on personal review on prior echo but less well seen due to different views. The remaining left ventricular segments contract normally.  3. Right Ventricle: Moderately dilated. Systolic function is normal.  4. Tricuspid Valve: There is insufficient regurgitation to estimate RVSP.  5. 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  6. As compared to the previous echocardiogram on 12/07/2021, the ascending aorta was found dilated at 3.7 cm from normal. Otherwise, no obvious changes.    PFT 2022:  Conclusion: No obstruction and no significant response to bronchodilator. The   reduced FVC implies restriction and this requires lung volume testing to   assess. She was unable to perform lung volume testing today due to   claustrophobia and also unable to perform DLCO testing. There are no previous   studies available for comparison.   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    #AHRF  #HFpEF exacerbation  #Hx of COPD(no exacerbation)  #OSA(on 2L nightly)  Hypoxic to 2 L at rest etiology most likely CHF exacerbation in the setting of missed home diuretic dose for a week.  Noted weight gain (up 6 pounds according to patient) and lower extremity edema.  Unlikely to be COPD exacerbation with no wheezing heard or chest tightness or difficulty breathing at rest.  Patient previously with COPD exacerbation and states that this is nothing like that where she is needed nebulizer treatment.  PE has been ruled out, normal  D-dimer.  No current illness is concern for pneumonia.  Patient does have OSA([facility] placed on 2 L at night, unclear why no CPAP) and there could be progression of pulmonary hypertension contributing to her hypoxemia.  Given her habitus there is concern about obesity hypoventilation however her her inpatient and outpatient bicarb are hard to interpret. On regimen of home Bumex 4mg  daily.  Previous discharge dry weight noted as 161Kg currently presenting at 158 kg.  We will have to establish new dry weight.  -Strict ins and outs-refusing pure wick, plan for bedside commode  -IV  Bumex 4mg  BID or TID   -BMP x 2, replete electro  -fluid goal: Net -2 L  -POCUS volume exam  -Continue Symbicort  -Repeat chest CT(2022 study with hyperinflation and 7 mm nodule in right upper lobe-Per Fleischner's criteria repeat chest CT in 12 months)  [ ]  Repeat PFTs  [ ]  Repeat sleep study    # Schizophrenia d/o bipolar type  # PTSD  # Recent admission for SI  Recently discharged 12/10/2022 for SI with plan.  Currently no SI.  No signs of active psychosis.  Continuing home medications.  -Fluoxetine 60 mg  -Buspirone 10 mg  -Hydroxyzine 25 mg as needed  -Mirtazapine 7.5 mg nightly    #PSUD(EtOH, cocaine)  #TUD  # Chronic pain 2/2 spinal stenosis  -Methocarbamol 750 mg daily  -Gabapentin 600 mg twice daily  -Suboxone 8 mg 3 times daily  -Nicotine patch/gum    # Seizure disorder  -Keppra 750 mg twice daily    # Hypothyroidism  -continue Synthroid 25 mcg daily    # GERD  -Pantoprazole tablet 40 mg      #Social/Family    #Transitional issues:    Diet: Heart healthy, low-sodium    DVT Prophylaxis: SQH    Medication Reconciliation: Done  Changes from home medication regimen: as above.    Code Status: Full Code  Health care proxy: @PROXYNAME @ Data Unavailable    Dispo: Diuresis to dry weight    Maceo Pro, PGY2    Discussed with attending Dr. Paul Half.

## 2022-12-29 NOTE — Narrator Note (Signed)
Report to [facility] RN

## 2022-12-30 ENCOUNTER — Inpatient Hospital Stay (HOSPITAL_BASED_OUTPATIENT_CLINIC_OR_DEPARTMENT_OTHER): Payer: No Typology Code available for payment source

## 2022-12-30 DIAGNOSIS — R918 Other nonspecific abnormal finding of lung field: Secondary | ICD-10-CM | POA: Diagnosis not present

## 2022-12-30 DIAGNOSIS — J9601 Acute respiratory failure with hypoxia: Secondary | ICD-10-CM | POA: Diagnosis not present

## 2022-12-30 DIAGNOSIS — J449 Chronic obstructive pulmonary disease, unspecified: Secondary | ICD-10-CM | POA: Diagnosis not present

## 2022-12-30 DIAGNOSIS — J432 Centrilobular emphysema: Secondary | ICD-10-CM | POA: Diagnosis not present

## 2022-12-30 DIAGNOSIS — G4733 Obstructive sleep apnea (adult) (pediatric): Secondary | ICD-10-CM | POA: Diagnosis not present

## 2022-12-30 DIAGNOSIS — I5033 Acute on chronic diastolic (congestive) heart failure: Secondary | ICD-10-CM | POA: Diagnosis not present

## 2022-12-30 LAB — BASIC METABOLIC PANEL
ANION GAP: 11 mmol/L (ref 10–22)
BUN (UREA NITROGEN): 11 mg/dL (ref 7–18)
CALCIUM: 9.3 mg/dL (ref 8.5–10.5)
CARBON DIOXIDE: 29 mmol/L (ref 21–32)
CHLORIDE: 100 mmol/L (ref 98–107)
CREATININE: 0.9 mg/dL (ref 0.4–1.2)
ESTIMATED GLOMERULAR FILT RATE: >60 mL/min (ref >60)
Glucose Random: 105 mg/dL (ref 74–160)
POTASSIUM: 4.5 mmol/L (ref 3.5–5.1)
SODIUM: 140 mmol/L (ref 136–145)

## 2022-12-30 LAB — HOLD PURPLE TOP TUBE

## 2022-12-30 LAB — PHOSPHORUS: PHOSPHORUS: 2.7 mg/dL (ref 2.5–4.9)

## 2022-12-30 LAB — MAGNESIUM: MAGNESIUM: 2.6 mg/dL (ref 1.6–2.6)

## 2022-12-30 MED ORDER — METHOCARBAMOL 500 MG PO TABS
750.0000 mg | ORAL_TABLET | Freq: Three times a day (TID) | ORAL | Status: DC
Start: 2022-12-30 — End: 2022-12-31
  Administered 2022-12-30 – 2022-12-31 (×3): 750 mg via ORAL
  Filled 2022-12-30 (×3): qty 2

## 2022-12-30 MED ORDER — BUMETANIDE 0.25 MG/ML IJ SOLN
4.00 mg | Freq: Once | INTRAMUSCULAR | Status: AC
Start: 2022-12-30 — End: 2022-12-30
  Administered 2022-12-30: 4 mg via INTRAVENOUS
  Filled 2022-12-30: qty 16

## 2022-12-30 MED ORDER — POTASSIUM CHLORIDE CRYS ER 20 MEQ PO TBCR
40.00 meq | EXTENDED_RELEASE_TABLET | Freq: Once | ORAL | Status: AC
Start: 2022-12-30 — End: 2022-12-30
  Administered 2022-12-30: 40 meq via ORAL
  Filled 2022-12-30: qty 2

## 2022-12-30 MED ORDER — BUMETANIDE 0.25 MG/ML IJ SOLN
4.0000 mg | Freq: Once | INTRAMUSCULAR | Status: DC
Start: 2022-12-30 — End: 2022-12-30
  Filled 2022-12-30: qty 16

## 2022-12-30 MED ORDER — MAGNESIUM SULFATE IN D5W 1-5 GM/100ML-% IV SOLN
1.0000 g | INTRAVENOUS | Status: AC
Start: 2022-12-30 — End: 2022-12-30
  Administered 2022-12-30 (×4): 1 g via INTRAVENOUS
  Filled 2022-12-30 (×4): qty 100

## 2022-12-30 MED ORDER — BUMETANIDE 0.25 MG/ML IJ SOLN
4.00 mg | Freq: Once | INTRAMUSCULAR | Status: AC
Start: 2022-12-30 — End: 2022-12-30
  Administered 2022-12-30: 4 mg via INTRAVENOUS

## 2022-12-30 NOTE — Progress Notes (Addendum)
PROGRESS NOTE  12/30/2022    PATIENT INFO: Cheryl Zimmerman, [age] year old female  DATE OF ADMISSION: 12/29/22    ROOM/BED LOCATION:  427/427-A  HOSPITAL DAY: 1    REVIEW OF OVERNIGHT EVENTS:  No acute overnight events    SUBJECTIVE:  States she is breathing fine and wants to go home.  States that she slept well overnight    OBJECTIVE:    VITAL SIGNS      12/30/22  0958 12/30/22  1000 12/30/22  1313 12/30/22  1335   BP: 140/81  115/69    Pulse:   86    Resp:  18 19 18    Temp:   98.9 F (37.2 C)    TempSrc:   Oral    SpO2:   (!) 88%    Weight:           INS/OUTS (PAST 24 HOURS)  I/O 24 Hrs:  In: 240 [P.O.:240]  Out: 1200 [Urine:1200]    PHYSICAL EXAM   GEN: No acute distress. Sitting/lying comfortably. Well developed, well nourished.   Neck: Supple. Trachea midline, No cervical lymphadenopathy.  Hard to assess JVP  CV: Regular rate and rhythm. S1 and S2 normal.   Pulm: Clear to auscultation bilaterally.  No wheezing  Abdomen: Normoactive bowel sounds. Soft and non-distended.   Extremities: Well perfused and warm.  Lower extremity edema 2+ bilaterally  skin: No lesions, rashes, ecchymosis, or petechiae.   Psych: Normal mood and affect.  Neuro: Alert and oriented x4. CN2-12 grossly intact. No focal deficits.    RECENT LABS    Chemistries:  Recent Labs     12/29/22  0929 12/29/22  1740 12/30/22  1645   NA 138 141 140   K 4.7 3.9 4.5   CO2 26 28 29    BUN 6* 6* 11   CREAT 0.8 0.7 0.9   CA 10.0 9.5 9.3   MG 2.4 2.0 2.6   PHOS  --  3.2 2.7   ANION 13 12 11    GFR > 60 > 60 > 60    GI:  Recent Labs     12/29/22  0929   AST 29   ALT 26   TBILI 0.3   ALKPHOS 195*   ALBUMIN 4.4      CBC:  Recent Labs     12/29/22  0929   WBC 7.5   HGB 16.4*   HCT 50.3*   PLTA 263    Coags:   No results for input(s): "INR", "PT", "APTT", "HPTT", "FIB" in the last 72 hours.   Trops, BNP, D-dimer, Lactate, C-RP, Procal, CK:  Recent Labs     12/29/22  0929 12/29/22  1022 12/29/22  1230   TROPTHS 11* 10 10   TROPTHSDELTA  --  1 1    PROBNP 48  --   --    DDPEDVT 497  --   --        Finger Sticks:  No results for input(s): "FINGERSTICKR" in the last 72 hours. Urinalysis:  No results for input(s): "UACOL", "UACLA", "UAGLU", "UABIL", "UAKET", "SPEGRAVURINE", "UAOCC", "UAPH", "UAPRO", "UABAC", "UARBC", "UAWBC", "UANIT", "LEUKOCYTES", "UAMIC", "UASQE" in the last 72 hours.    Invalid input(s): "UAOBU"           Other notable labs:       Microbiology:  Respiratory Panel  Lab Results   Component Value Date    COVID19  12/29/2022     Negative for SARS-CoV-2(2019 novel coronavirus)by  PCR  methodology.  Negative results do not preclude 2019-nCoV infection and  should not be used as the sole basis for patient management  decisions. Negative results must be combined with clinical  observations, patient history, and epidemiological  information.  This test has been authorized by the FDA under an Emergency  Use Authorization (EUA) for use by authorized laboratories.      COVID19  12/10/2022     Negative for SARS-CoV-2(2019 novel coronavirus)by PCR  methodology.  Negative results do not preclude 2019-nCoV infection and  should not be used as the sole basis for patient management  decisions. Negative results must be combined with clinical  observations, patient history, and epidemiological  information.  This test has been authorized by the FDA under an Emergency  Use Authorization (EUA) for use by authorized laboratories.      Lab Results   Component Value Date    FLUA  12/29/2022     Negative for Influenza A by PCR methodology.  This test has been authorized by the FDA under an Emergency  Use Authorization (EUA) for use by authorized laboratories.      FLUB  12/29/2022     Negative for influenza B by PCR methodology.  This test has been authorized by the FDA under an Emergency  Use Authorization (EUA) for use by authorized laboratories.      RSV  12/29/2022     Negative for Respiratory Syncytial Virus by a PCR method.  This test has been authorized by the  FDA under an Emergency  Use Authorization (EUA) for use by authorized laboratories.            Culture Data  Blood Cultures  No results found for: "BLCA"  No results found for: "BLCAN"    Urine Cultures  No results found for: "UC"    Respiratory Cultures  No results found for: "RC" Gram Stain  Lab Results   Component Value Date    GS  06/04/2011      GRAM STAIN MODERATE RED BLOOD CELLS FEW POLYMORPHONUCLEAR LEUKOCYTES RARE   TO FEW GRAM POSITIVE COCCI IN PAIRS       Other Cultures:  No results found for: "WC"  No results found for: "BFC"     Other Micro  No results found for: "STREPAG"  No results found for: "MRSASAPCR"  No results found for: "CDIFFTOX"       IMAGING AND OTHER STUDIES  No new imaging or other studies to review.  Telemetry notable findings: none    CTCHEST:  IMPRESSION:     Multiple pulmonary nodules largest a 7 mm solid nodule in the right upper lobe, unchanged compared to CT 12/30/2021. New compared to prior CT from 2012.     Mild centrilobular emphysema.     Hepatic steatosis and hepatosplenomegaly.     RECOMMENDATION:  - Consider follow-up low-dose CT chest in 12 months per Fleischner guidelines, 2017.    MEDICATIONS  See list in EPIC    ASSESSMENT & PLAN   52 yo F PMH COPD (2L NC), OSA not on CPAP, HFpEF, seizure disorder, GERD, OUD on Suboxone, PTSD, schizoaffective disorder w/ prior hospitalizations for SI, bipolar disorder, borderline personality disorder, and obesity p/w progressive SOB and found to be hypoxic after missing her home Bumex for a week concerning for volume overload.     #AHRF  #HFpEF exacerbation  #Hx of COPD(no exacerbation)  #OSA(on 2L nightly)  Hypoxic to 2 L at rest etiology most likely CHF  exacerbation in the setting of missed home diuretic dose for a week.  Noted weight gain (up 6 pounds according to patient) and lower extremity edema.  Unlikely to be COPD exacerbation with no wheezing heard or chest tightness or difficulty breathing at rest.  Patient previously with COPD  exacerbation and states that this is nothing like that where she is needed nebulizer treatment.  PE has been ruled out, normal D-dimer.  No current illness is concern for pneumonia.  Patient does have OSA([facility] on 2 L at night, unclear why no CPAP) and there could be progression of pulmonary hypertension contributing to her hypoxemia.  Given her habitus there is concern about obesity hypoventilation however her her inpatient and outpatient bicarb are hard to interpret. On regimen of home Bumex  daily.  Previous discharge dry weight noted as 161Kg currently presenting at 158 kg.  We will have to establish new dry weight.  -Strict ins and outs-refusing pure wick, plan for bedside commode  -IV Bumex  BID today  -BMP x 2, replete electro  -fluid goal: Net -2 L  -POCUS volume exam  -Continue Symbicort  -s/p chest CT(2022 study with hyperinflation and 7 mm nodule in right upper lobe-Per Fleischner's criteria repeat chest CT in 12 months)   Repeat PFTs   Repeat sleep study     # Schizophrenia d/o bipolar type  # PTSD  # Recent admission for SI  Recently discharged 12/10/2022 for SI with plan.  Currently no SI.  No signs of active psychosis.  Continuing home medications.  -Fluoxetine 60 mg  -Buspirone 10 mg  -Hydroxyzine 25 mg as needed  -Mirtazapine 7.5 mg nightly     #PSUD(EtOH, cocaine)  #TUD  # Chronic pain 2/2 spinal stenosis  -Methocarbamol 750 mg TID  -Gabapentin 600 mg twice daily  -Suboxone 8 mg 3 times daily  -Nicotine patch/gum     # Seizure disorder  -Keppra 750 mg twice daily     # Hypothyroidism  -continue Synthroid 25 mcg daily     # GERD  -Pantoprazole tablet 40 mg       #Social/Family:    #Transitional issues:    Telemetry: Yes  Foley: No  FEN: PRN  Prophylaxis: sqh    Medication Reconciliation: Done    Code Status  Full Code    Health care proxy:     Dispo: HOME TMR      Caleen Taaffe, PGY2

## 2022-12-30 NOTE — Care Coordination (Addendum)
Discussed in MDR. Chart reviewed. 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 Imagings performed, on diuresis, monitoring labs, strict I&O's, fluid goal: Net -2 L, breathing tx. CM met w/ pt at bedside but only provided very limited info. Pt lives in a wheelchair accessible apartment. She is wheelchair level at baseline. No current VNA services. CM offered to coordinate VNA services if will be recommended but the pt refused, accdg to her her insurance and doctor is already working on it. Accdg to her she is able to manage her meds and has been.    Dispo to home likely tomorrow if medically cleared. Her O2 is at home. Will need an ambulance for transportation.    Per [facility] CM Caitlin:    Home O2 via [employer].  PCP: [facility]  [facility] for Personal Care agency 8.5 hours/week (5 hours for personal care and 3.5 hour for household task). Companion services for 2.5 hours/week for shopping/transportation.  14 meals per week via [employer]  DME: Seated rollator, oximeter, twin mattress cushion, leg elevation pillows, compression stockings, knee brace, rolling walker, raised toilet seat  HCP dated 03/17/20 available in eCW, available in GC     Known to [facility]  12/09/22  Known to [name] (11/23)     Readmission Assessment  What Were The Most Important Contributors To The Patient's Readmission: Factors related to index hospitalization;Medical disease process;Psychiatric disease process     Medical Disease Process  Explain Medical Disease Process: New medical problem (comment which one) (Pt was prior admitted w/ depressive disorder, now presenting for    #AHRF  #HFpEF exacerbation  #Hx of COPD(no  exacerbation)  #OSA(on 2L nightly))  Psychiatric Disease Process  Which Psychiatric Disease Process: Depression;Other psychiatric disease (comment);Other substance abuse (comment)                                               12/30/22 1603   Admission   Reviewed for admission Yes   Observation Notice delivered to patient No   Does patient have prescription drug coverage? Yes   Reason for Admission #AHRF  #HFpEF exacerbation  #Hx of COPD(no exacerbation)  #OSA(on 2L nightly)   Readmission Assessment  Yes    Readmission Assessment   What Were The Most Important Contributors To The Patient's Readmission Factors related to index hospitalization;Medical disease process;Psychiatric disease process   Medical Disease Process   Explain Medical Disease Process New medical problem (comment which one)  (Pt was prior admitted w/ depressive disorder, now presenting for    #AHRF  #HFpEF exacerbation  #Hx of COPD(no exacerbation)  #OSA(on 2L nightly))   Psychiatric Disease Process   Which Psychiatric Disease Process Depression;Other psychiatric disease (comment);Other substance abuse (comment)   Mental Status Upon Admission   Mental Status Upon Admission AO   Demographics   Demographic Information Correct Yes   Patient Status   Is the patient own decision-maker? Yes   Is a Social Work consult needed for Health Care Proxy or Guardianship? No   Caregiver   Does the patient  have a caregiver? No   Psychosocial   Admitted From: Home   Challenges Adjustment to diagnosis/injury/illness;Psychosocial issues   Community Support Primary Care Provider   Primary Caretaker for Someone No   Providing self care at home? Yes   Functional Screen   Level of function on admission Independent     Toileting Dependence   Feeding Independence   Transfers Dependent   Ambulation UTA   Mobility Devices Wheelchair   Living Situation   Lives with Alone   Type of Residence House/apartment   Living Setting Apartment  (wheelchair accessible)   Anticipated  Discharge Plan   Expected Discharge Date 12/31/22   Anticipated Outcome Home With Home Health   Transportation at Discharge Ambulance   Patient expects to be discharged to: Home   Interpreter Requested No   Home Care Services Yes

## 2022-12-30 NOTE — Plan of Care (Signed)
Pt is alert and oriented x4, agitated and verbally aggressive at times. Independent to the commode. Pt had CT today. All medications given as ordered except Pt refused miralax. Fall risk, bed alarm on, call bell in reach, bed in lowest position, continue with care.

## 2022-12-31 DIAGNOSIS — I5033 Acute on chronic diastolic (congestive) heart failure: Secondary | ICD-10-CM | POA: Diagnosis not present

## 2022-12-31 DIAGNOSIS — J9601 Acute respiratory failure with hypoxia: Secondary | ICD-10-CM | POA: Diagnosis not present

## 2022-12-31 DIAGNOSIS — J449 Chronic obstructive pulmonary disease, unspecified: Secondary | ICD-10-CM | POA: Diagnosis not present

## 2022-12-31 DIAGNOSIS — G4733 Obstructive sleep apnea (adult) (pediatric): Secondary | ICD-10-CM | POA: Diagnosis not present

## 2022-12-31 MED ORDER — BUMETANIDE 0.5 MG PO TABS
4.0000 mg | ORAL_TABLET | Freq: Every day | ORAL | 0 refills | Status: DC
Start: 2022-12-31 — End: 2023-02-09

## 2022-12-31 MED ORDER — BUMETANIDE 0.5 MG PO TABS
4.0000 mg | ORAL_TABLET | Freq: Two times a day (BID) | ORAL | 0 refills | Status: DC
Start: 2022-12-31 — End: 2022-12-31

## 2022-12-31 MED ORDER — BUMETANIDE 0.25 MG/ML IJ SOLN
4.00 mg | Freq: Once | INTRAMUSCULAR | Status: AC
Start: 2022-12-31 — End: 2022-12-31
  Administered 2022-12-31: 4 mg via INTRAVENOUS
  Filled 2022-12-31: qty 16

## 2022-12-31 NOTE — Plan of Care (Addendum)
Problem: Hemodynamic Status  Goal: Patient has stable vital signs and fluid balance  Description: Assess and monitor patient's heart rate, rhythm, respiratory rate, peripheral pulses, capillary refill, color, body temperature, intake and output, labs and physical activity tolerance.   Observe for signs of chest pain (note location, duration, severity, radiation and associated symptoms such as diaphoresis, nausea, indigestion).  Monitor for signs and symptoms of heart failure (eg. shortness of breath, edema of feet/ankles/legs, rapid irregular heart rate, coughing, wheezing, white/pink blood tinged sputum, sudden weight gain, chest pain). Collaborate with interdisciplinary team and initiate plan and interventions as ordered.  Outcome: Progressing     Problem: COPD  Goal: Patient will maintain patent airway  Outcome: Progressing     Problem: Fall risk identification/communication (REQUIRED)  Description: REQUIRED if Fall Risk score is 5 or greater  Goal: Patients at risk for falling will be known to staff  Description: Fall risk assessment to be done:       Upon Admission       Transfer from one level of care to another       Whenever there is a change in patient status       After every fall event   Outcome: Progressing     Patient is alert and awake, oriented x 3, anxious and flat affect. Verbally abuse to staffs. C/o fatigue and tiredness. Maintains on fall precautions, bed alarm on, call light within reach, denies any pain and SOB, but refused on heparin sc and check VS at night. Current cigarette smoking daily. Apply for nicotine therapy and smoking cessation. Continue to support.

## 2022-12-31 NOTE — Plan of Care (Signed)
Pt alert and awake, comfortable, w/c bound at baseline, agitated at times, afebrile,VSS, tol RA, Bumex iv given,diuresing well, pt wanting to go home, plan for d/c to home with PO Bumex, RX sent to her pharmacy. Ambulance being arranged by CM. Will cont to monitor.    Problem: Fall risk identification/communication (REQUIRED)  Description: REQUIRED if Fall Risk score is 5 or greater  Goal: Patients at risk for falling will be known to staff  Description: Fall risk assessment to be done:       Upon Admission       Transfer from one level of care to another       Whenever there is a change in patient status       After every fall event   Outcome: Progressing     Problem: Altered Mobility  Goal: Reduce fall/injury risk related to altered mobility  Outcome: Progressing     Problem: COPD  Goal: Patient will maintain patent airway  Outcome: Progressing     Problem: Inadequate Airway Clearance  Goal: Patient will maintain patent airway  Description: Assess and monitor breath sounds, cough and sputum (if present), and intake/output. Collaborate with respiratory therapy to administer medications and treatments.   Outcome: Progressing     Problem: Hemodynamic Status  Goal: Patient has stable vital signs and fluid balance  Description: Assess and monitor patient's heart rate, rhythm, respiratory rate, peripheral pulses, capillary refill, color, body temperature, intake and output, labs and physical activity tolerance.   Observe for signs of chest pain (note location, duration, severity, radiation and associated symptoms such as diaphoresis, nausea, indigestion).  Monitor for signs and symptoms of heart failure (eg. shortness of breath, edema of feet/ankles/legs, rapid irregular heart rate, coughing, wheezing, white/pink blood tinged sputum, sudden weight gain, chest pain). Collaborate with interdisciplinary team and initiate plan and interventions as ordered.  Outcome: Progressing

## 2022-12-31 NOTE — Discharge Summary (Addendum)
Physician Discharge Summary     Patient ID:  Name Cheryl Zimmerman  MRN [id number]   Age 52 year old  DOB 01-29-1971    Admit Date: 12/29/22    Discharge Date: 12/31/22  Admitting Physician: Rayburn Go, MD  Discharge Physicians      Resident: Maceo Pro, MD   Attending: No att. 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  Presenting weight of 158 kilograms.  Patient underwent 2 days of active IV diuresis.  Patient requested discharge and no longer wanted to participate for further treatment.  We felt patient was safe to go home since she was back on her baseline room air at rest, oxygen saturations of 95 to 97%.  We even prescribed B[facility] 4 mg daily [facility] since we did not have any Bumex in stock to do meds to bed.  Patient was instructed to pick up her Bumex 4 mg and take daily.  Her discharge weight 155Kg(hard to say if this is her true dry weight)    Issues for Follow up:   [ ]  Consider sleep study - OSA/OHS eval (currently on 2 L nightly)  [ ]  Consider repeat PFT(previous PFT in 2022 without obstructive but noted restriction most likely due to habitus, there could be new interval change since 2022 with regards to COPD)  [ ]  CT chest-following 7 mm pulmonary nodule, repeat in 12 months per Fleischner's criteria(current everyday smoker)    Relevant Past Medical History: Past Medical History:  schizo: Bipolar affective ([name])  No date: Chronic systolic CHF (congestive heart failure) ([name])  No date: Depression  12/23/2021: Fall in home  12/23/2021: Gait instability  12/23/2021: Requires continuous at home supplemental  oxygen  depress: Schizo affective schizophrenia ([name])  12/23/2021: Spinal stenosis of lumbar 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   claudication  No date: Substance abuse ([name])    Summary of the Presenting Complaint: (From H&P)  "Cheryl Zimmerman is a 52 year old English-speaking female PMH COPD (2L NC), OSA not on CPAP, HFpEF, seizure disorder, GERD, 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  Denies any recent illnesses, URI symptoms, fevers chills.  Denies any wheezing or chest tightness.  Continued everyday smoker. "    Hospital Course (by problem):  #AHRF  #HFpEF exacerbation  #Hx of COPD(no exacerbation)  #OSA(on 2L nightly)  Hypoxic to 2 L at rest etiology most likely CHF exacerbation in the setting of missed home diuretic dose for a week.  Noted weight gain (up 6 pounds according to patient) and lower extremity edema.  Unlikely to be COPD exacerbation with no wheezing heard or chest tightness or difficulty breathing at rest.  Patient previously with COPD exacerbation and states that this is nothing like that where she is needed nebulizer treatment.  PE has been ruled out, normal D-dimer.  No current illness is concern for pneumonia.  Patient does have OSA([facility] 2 L at night, unclear  why no CPAP) and there could be progression of pulmonary hypertension contributing to her hypoxemia.  Given her habitus there is concern about obesity hypoventilation however her her inpatient and outpatient bicarb are hard to interpret. On regimen of home Bumex  daily.  Previous discharge dry weight noted as 161Kg currently presenting at 158 kg.  We will have to establish new dry weight.  Patient was discharged upon her request, she also did not allow for full blood work and vital sign checks.   We feel that she is safe to be discharged however I do not think that this is her dry weight.  Patient will have to be reevaluated for a thorough volume status exam and repeat BMP.  -Continue Symbicort  -s/p chest CT(2022 study with hyperinflation and 7 mm nodule in right upper lobe-Per Fleischner's criteria repeat chest CT in 12 months)   1 week BMP check volume status exam   Repeat PFTs   Repeat sleep study     # Schizophrenia d/o bipolar type  # PTSD  # Recent admission for SI  Recently discharged 12/10/2022 for SI with plan.  Currently no SI.  No signs of active psychosis.  Continuing home medications.  -Fluoxetine 60 mg  -Buspirone 10 mg  -Hydroxyzine 25 mg as needed  -Mirtazapine 7.5 mg nightly     #PSUD(EtOH, cocaine)  #TUD  # Chronic pain 2/2 spinal stenosis  -Methocarbamol 750 mg TID  -Gabapentin 600 mg twice daily  -Suboxone 8 mg 3 times daily  -Nicotine patch/gum     # Seizure disorder  -Keppra 750 mg twice daily     # Hypothyroidism  -continue Synthroid 25 mcg daily     # GERD  -Pantoprazole tablet 40 mg    Key labs, imaging, other tests:   None    Pending Laboratory Work and Tests:   None    Discharge Exam:  BP 111/70   Pulse 73   Temp 98.7 F (37.1 C) (Oral)   Resp 20   Wt (!) 155.2 kg (342 lb 1.6 oz)   SpO2 96%   BMI 56.93 kg/m   GEN: No acute distress. Sitting/lying comfortably. Well developed, well nourished.   Neck: Supple. Trachea midline, No cervical lymphadenopathy.  Hard to assess JVP  CV: Regular rate and rhythm. S1 and S2 normal.   Pulm: Clear to auscultation bilaterally.  No wheezing  Abdomen: Normoactive bowel sounds. Soft and non-distended.   Extremities: Well perfused and warm.  Lower extremity edema 2+ bilaterally  skin: No lesions, rashes, ecchymosis, or petechiae.   Psych: Normal mood and affect.  Neuro: Alert and oriented x4. CN2-12 grossly intact. No focal deficits.       Discharge Medication List:  Discharge Medication List as of 12/31/2022 12:14 PM    CONTINUE these  medications which have CHANGED    bumetanide (BUMEX) 0.5 MG tablet  Take 8 tablets by mouth in the morning.  E-Prescribe, Disp-240 tablet, R-0      CONTINUE these medications which have NOT CHANGED    levETIRAcetam (KEPPRA) 750 MG tablet  TAKE 1 TABLET BY MOUTH EVERY MORNING AND 1 TABLET BEFORE BEDTIME  E-Prescribe, Disp-180 tablet, R-3    spironolactone (ALDACTONE) 100 MG tablet  TAKE 1 TABLET BY MOUTH EVERY DAY IN THE MORNING  E-Prescribe, Disp-90 tablet, R-3    atorvastatin (LIPITOR) 80 MG tablet  TAKE 1 TABLET BY MOUTH EVERY DAY IN THE MORNING  E-Prescribe, Disp-90 tablet, R-3    levothyroxine (  SYNTHROID) 25 MCG tablet  TAKE 1 TABLET BY MOUTH EVERY DAY IN THE MORNING BEFORE BREAKFAST  E-Prescribe, Disp-90 tablet, R-3    Misc. Devices ([facility] PULSE OXIMETER) MISC  Use 1 each As Directed in the morning.  E-Prescribe, Disp-1 each, R-0    !! FLUoxetine (PROZAC) 20 MG capsule  Take 1 capsule by mouth in the morning. Please take with  capsules for a total of  daily.  E-Prescribe, Disp-30 capsule, R-0  Ok to dispense capsules or tablets.      !! FLUoxetine (PROZAC) 40 MG capsule  Take 1 capsule by mouth in the morning. Please take with  capsules for a total of  daily.  E-Prescribe, Disp-30 capsule, R-0    mirtazapine (REMERON) 7.5 MG tablet  Take 7.5 mg by mouth nightly  Historical    budesonide-formoterol (SYMBICORT) 160-4.5 MCG/ACT inhaler  Inhale 2 puffs into the lungs in the morning and 2 puffs before bedtime.  Historical    methocarbamol (ROBAXIN) 500 MG tablet  Take 1.5 tablets by mouth in the morning and 1.5 tablets at noon and 1.5 tablets before bedtime.  E-Prescribe, Disp-135 tablet, R-0    gabapentin (NEURONTIN) 600 MG tablet  Take 1 tablet by mouth in the morning and 1 tablet before bedtime.  E-Prescribe, Disp-60 tablet, R-0    OXcarbazepine (TRILEPTAL) 150 MG tablet  Take 1 tablet by mouth in the morning and 1 tablet before bedtime.  E-Prescribe, Disp-60 tablet, R-0    busPIRone (BUSPAR) 10 MG  tablet  Take 2 tablets by mouth in the morning and 2 tablets at noon and 2 tablets before bedtime.  Historical, Disp-540 tablet, R-0    polyethylene glycol (GLYCOLAX/MIRALAX) 17 g packet  Take 1 packet by mouth in the morning.  E-Prescribe, Disp-90 packet, R-3    Potassium Chloride ER 20 MEQ TBCR  Take 20 mEq by mouth in the morning.  E-Prescribe, Disp-60 tablet, R-0    Multiple Vitamin (MULTIVITAMIN) tablet  Take 1 tablet by mouth in the morning.  E-Prescribe, Disp-90 tablet, R-3    nicotine (NICODERM CQ) 21 MG/24HR  Place 1 patch onto the skin in the morning.  E-Prescribe, Disp-42 patch, R-0    nicotine (NICODERM CQ) 14 MG/24HR  Place 1 patch onto the skin in the morning for 14 days.  E-Prescribe, Disp-14 patch, R-0    nicotine (NICODERM CQ) 7 MG/24HR  Place 1 patch onto the skin in the morning for 14 days.  E-Prescribe, Disp-14 patch, R-0    nicotine polacrilex (NICORETTE) 4 MG gum  Take 1 each by mouth as needed for Craving (Nicotine)  E-Prescribe, Disp-100 tablet, R-11    acetaminophen (TYLENOL) 325 MG tablet  Take 2 tablets by mouth every 6 (six) hours as needed for Pain  Historical    baclofen (LIORESAL) 10 MG tablet  Take 1 tablet by mouth in the morning and 1 tablet before bedtime.  Historical    diclofenac (VOLTAREN) 1 % GEL Gel  Apply 2 g topically in the morning and 2 g at noon and 2 g in the evening and 2 g before bedtime.  Historical    prazosin (MINIPRESS) 2 MG capsule  Take 1 capsule by mouth nightly  Historical, Disp-30 capsule, R-0    pantoprazole (PROTONIX) 40 MG tablet  Take 1 tablet by mouth in the morning.  Historical    Melatonin 5 MG TABS Tablet  Take 5 mg by mouth nightly  Historical    fluticasone (FLONASE) 50 MCG/ACT nasal spray  2  sprays by Each Nostril route in the morning and 2 sprays before bedtime.  Historical    hydrOXYzine (ATARAX) 25 MG tablet  Take 25 mg by mouth every 6 (six) hours as needed for Anxiety  Historical    thiamine (VITAMIN B-1) 100 mg tablet  Take 100 mg by mouth in  the morning.  Historical    OTHER MEDICATION  1 each by Other route in the morning. Wheelchair: Bariatric motorized wheel chare (height 5'9", weight 328 lb, bmi 48.4)  For use as directed.    Please fax to [facility] at [phone]  DX: Spinal stenosis of lumbar 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 claudication, Gai t instability, Morbid obesity ([name])  ICD10:M48.061, R26.81, E66.01.  Transcribed for Prior Auth, Disp-1 each, R-0    !! - Potential duplicate medications found. Please discuss with provider.          Code Status at Admission: Full  Code Status at Discharge: Full Code  Healthcare Proxy and Phone Number: Data Unavailable  Data Unavailable   Diet: Heart healthy diet  Wound Care: None  Discharge Destination: Home      Future appointments:   Current and Future Appointments at [facility] (90 Days) 12/31/2022 - 03/31/2023        Date Visit Type Length Department Provider     01/06/2023 10:00 AM HOSPITAL FOLLOW-UP 20 min [facility] - [address] [[id number]] Mila Homer., PA-C    Patient Instructions:                        Follow-up Information    None         To reach medical records at any time call the [facility] operator at [phone]     To reach the covering hospitalist, page [phone]  To reach the [facility] Laboratory for pending results, call [phone]    Signed:  Maceo Pro, MD, 12/31/2022

## 2022-12-31 NOTE — Case Mgmt Note (Signed)
Patient discussed in MDR. Patient is medically cleared for d/c home today. Patient has refused VNA services. She stated her insurance is helping her with everything she needs. Patient will d/c home via ambulance. She has her w/c with her. A prescription for po bumex has been sent to her home pharmacy. Patient states she will be able to get it today.   CM left a message for Caitlyn from [facility] to notify her of the d/c home.

## 2022-12-31 NOTE — Discharge Inst - Reason you came to the hospital (Addendum)
Dear Mrs. Waldrep:     It was a pleasure taking care of you.     During your hospitalization, you were treated for the following conditions:   Heart failure exacerbation    Description of your time in the hospital:     You came into the hospital because you felt short of breath with increased weight gain due to missing your Bumex.  We gave IV Bumex here in the hospital you went down 4 pounds through her hospital course.  Because you stated that you feel well we feel that you are safe to go home.  We sent over a prescription [facility] mg [facility] [address], [place], [place] [address] [phone] .  Please pick up your Bumex 4mg  and take them daily.    After Hospitalization Instructions:  - Please keep any follow up appointments that have been made for you  - Take all your medications as prescribed     Go to the emergency room if you experience:  - Chest pain or shortness of breath  - Severe symptoms of any kind    FOR EMERGENCIES CALL 911    Best wishes,  Your [facility] Team

## 2023-01-02 LAB — EKG

## 2023-01-02 NOTE — Telephone Encounter (Signed)
error 

## 2023-01-03 ENCOUNTER — Encounter (HOSPITAL_BASED_OUTPATIENT_CLINIC_OR_DEPARTMENT_OTHER): Payer: Self-pay

## 2023-01-03 NOTE — Telephone Encounter (Signed)
Failed attempt to outreach patient to complete inpatient discharge follow-up call for COPD exacerbation  Voicemail reached x 2   LMOM with clinic contact info requesting call back    Luisa Hart, RN  01/03/2023  3:25 PM

## 2023-01-04 NOTE — Telephone Encounter (Signed)
RN called pt, introduced this Clinical research associate.   Confirmed pt name and DOB.   Pt states "I'm fine."   Asked pt if experiencing any s/s of ongoing COPD exacerbation (SOB, weight gain, wheezing etc)   Pt responds "No."   Pt interrupted nurse while attempting to assess and states "I'm fine."  Pt cancelled hospital follow up appt on 01/06/23 with Elana Alm PA-C.   RN offered to reschedule appt, pt states "I'm not going to [facility] clinic for my doctors anymore. I live all the way in [place]. I'm not going all the way to [place]."  RN began asking pt if she has found a new PCP office and pt responded "No!".  Pt agreeable to scheduling hospital follow up appt as TV.   RN offered pt appt for this coming Monday however pt states "I'm not talking to anyone except for Dr. Otilio Miu."  RN informed pt that next appt with Dr. Otilio Miu is on 01/23/23, pt responds " That's fine."   RN scheduled TV with provider on 5/9 as pt requested.     Ronalee Red, RN, 01/04/2023

## 2023-01-06 ENCOUNTER — Ambulatory Visit (HOSPITAL_BASED_OUTPATIENT_CLINIC_OR_DEPARTMENT_OTHER): Payer: No Typology Code available for payment source | Admitting: Physician Assistant

## 2023-01-06 ENCOUNTER — Telehealth (HOSPITAL_BASED_OUTPATIENT_CLINIC_OR_DEPARTMENT_OTHER): Payer: Self-pay

## 2023-01-06 NOTE — Telephone Encounter (Signed)
RN returned call to [facility] through [facility])  Joudi states that pt informed her that Henrene Dodge was in the process of finding her a HHA.   RN reviewed referrals and notes, unable to find documentation of HHA referral.   Joudi states that she is in the process of finding pt a HHA and just wanted to confirm with clinic is not doing the same work.  Joudi has no other questions or concerns at this time.    Ronalee Red, RN, 01/06/2023

## 2023-01-06 NOTE — Telephone Encounter (Signed)
-----   Message from [place] sent at 01/06/2023 12:52 PM EDT -----  Regarding: to speak to nurse with question and follow up  Contact: [phone]  Cheryl Zimmerman,  [id number],  52 year old,  female  Telephone Information:  Home Phone      [phone]  Work Phone      Not on file.  Mobile          [phone]    Patient's PCP: [name], MD    Patient's language of care:   English    Person calling on behalf of patient: Joudi from [place]     Calls today to speak to nurse with question and follow up    CALL BACK NUMBER: [phone]

## 2023-01-06 NOTE — Telephone Encounter (Signed)
RN returned call to Joudi from [place]?   Unsure of who or what company is calling.   No ROI on file.   VML requesting return call if assistance is needed.   Call back number to clinic recited x2.     Ronalee Red, RN, 01/06/2023

## 2023-01-12 ENCOUNTER — Telehealth (HOSPITAL_BASED_OUTPATIENT_CLINIC_OR_DEPARTMENT_OTHER): Payer: Self-pay

## 2023-01-12 NOTE — Telephone Encounter (Signed)
-----   Message from [place] sent at 01/12/2023  1:02 PM EDT -----  Regarding: today for test result CT  Contact: [phone]  Cheryl Zimmerman,  [id number],  52 year old,  female  Telephone Information:  Home Phone      [phone]  Work Phone      Not on file.  Mobile          [phone]    Patient's PCP: [name], MD    Patient's language of care:   English    Person calling on behalf of patient: Patient (self    Calls today for test result CT     CALL BACK NUMBER:  [phone]

## 2023-01-12 NOTE — Telephone Encounter (Signed)
Regarding: today for test result CT     CALL BACK NUMBER:  [phone]        Pt was contacted and identified  Pt requests to discuss CT scan done at the hospital during admission on 4/12  Pt was initially offered a Hospital follow up but declined  Pt reports to be feeling well, but would like to know what was found in the CT scan  Adds they never reviewed CT scan result with her in the hospital  Pt aware of upcoming televisit with Dr Otilio Miu on 5/9  Advised message will be routed to Dr Otilio Miu      Susie Cassette, RN, 01/12/2023

## 2023-01-18 ENCOUNTER — Other Ambulatory Visit (HOSPITAL_BASED_OUTPATIENT_CLINIC_OR_DEPARTMENT_OTHER): Payer: Self-pay | Admitting: Ambulatory Care

## 2023-01-18 ENCOUNTER — Other Ambulatory Visit (HOSPITAL_BASED_OUTPATIENT_CLINIC_OR_DEPARTMENT_OTHER): Payer: Self-pay

## 2023-01-18 DIAGNOSIS — G894 Chronic pain syndrome: Secondary | ICD-10-CM

## 2023-01-18 NOTE — Telephone Encounter (Signed)
Spoke w/ pt  Confirmed pt's identity using 2 patient identifiers.    Pt says she needs Suboxone films as the sublingual tablets tastes terrible     Pt is running out and needs Suboxone Rx for films by tomorrow    buprenorphine-naloxone (SUBOXONE) 8-2 MG sublingual film [Pharmacy Med Name: BUPRENORPHINE-NALOX 8-2MG  FILM]     Preferred pharmacy:  [id number] [facility], [place] - [address]  [address], [place] [address]  Phone: [phone]  Fax: [phone]

## 2023-01-18 NOTE — Telephone Encounter (Signed)
PER Pharmacy, Cheryl Zimmerman is a 52 year old female has requested a refill of SUBOXONE    Start Date: 12/15/22 End Date: 12/22/22   .      Last Office Visit: 12/01/22 with Oletta Darter  Last Physical Exam: n/a    There are no preventive care reminders to display for this patient.    Other Med Adult:  Most Recent BP Reading(s)  12/31/22 : 111/70        Cholesterol (mg/dL)   Date Value   16/06/9603 263 (H)     LOW DENSITY LIPOPROTEIN DIRECT (mg/dL)   Date Value   54/05/8118 192 (H)     HIGH DENSITY LIPOPROTEIN (mg/dL)   Date Value   14/78/2956 40     TRIGLYCERIDES (mg/dL)   Date Value   21/30/8657 301 (H)         THYROID SCREEN TSH REFLEX FT4 (uIU/mL)   Date Value   07/15/2022 5.540 (H)         TSH (THYROID STIM HORMONE) (uIU/mL)   Date Value   12/10/2022 1.190       HEMOGLOBIN A1C (%)   Date Value   07/15/2022 6.5 (H)       No results found for: "POCA1C"      INR (no units)   Date Value   06/02/2011 1.0 (L)   06/02/2011 < 1.0 (*L)     INR CARE EVERYWHERE (no units)   Date Value   10/16/2021 1.0   09/23/2021 0.9   01/06/2021 1.1   11/11/2020 1.0   07/05/2020 0.9   07/04/2020 0.8 (L)   03/13/2020 1.0       SODIUM (mmol/L)   Date Value   12/30/2022 140       POTASSIUM (mmol/L)   Date Value   12/30/2022 4.5           CREATININE (mg/dL)   Date Value   84/69/6295 0.9       Documented patient preferred pharmacies:    [facility], [place], [place] - [address]  Phone: [phone] Fax: [phone]

## 2023-01-19 ENCOUNTER — Other Ambulatory Visit (HOSPITAL_BASED_OUTPATIENT_CLINIC_OR_DEPARTMENT_OTHER): Payer: Self-pay

## 2023-01-19 MED ORDER — BUPRENORPHINE HCL-NALOXONE HCL 8-2 MG SL FILM
1.00 | ORAL_FILM | Freq: Three times a day (TID) | SUBLINGUAL | 0 refills | Status: AC
Start: 2023-01-19 — End: 2023-01-26

## 2023-01-19 NOTE — Telephone Encounter (Signed)
PER Pharmacy,NAME@ is a 52 year old female has requested a refill of pantoprazole (PROTONIX) 40 MG tablet .      - Please review, pantoprazole (PROTONIX) 40 MG tablet  has been marked Historical on 10/28/22.      Last Office Visit: 12/25/22 with Roger Shelter, Salena Saner  Last Physical Exam: never done     Other Med Adult:  Most Recent BP Reading(s)  12/31/22 : 111/70        Cholesterol (mg/dL)   Date Value   16/06/9603 263 (H)     LOW DENSITY LIPOPROTEIN DIRECT (mg/dL)   Date Value   54/05/8118 192 (H)     HIGH DENSITY LIPOPROTEIN (mg/dL)   Date Value   14/78/2956 40     TRIGLYCERIDES (mg/dL)   Date Value   21/30/8657 301 (H)         THYROID SCREEN TSH REFLEX FT4 (uIU/mL)   Date Value   07/15/2022 5.540 (H)         TSH (THYROID STIM HORMONE) (uIU/mL)   Date Value   12/10/2022 1.190       HEMOGLOBIN A1C (%)   Date Value   07/15/2022 6.5 (H)       No results found for: "POCA1C"      INR (no units)   Date Value   06/02/2011 1.0 (L)   06/02/2011 < 1.0 (*L)     INR CARE EVERYWHERE (no units)   Date Value   10/16/2021 1.0   09/23/2021 0.9   01/06/2021 1.1   11/11/2020 1.0   07/05/2020 0.9   07/04/2020 0.8 (L)   03/13/2020 1.0       SODIUM (mmol/L)   Date Value   12/30/2022 140       POTASSIUM (mmol/L)   Date Value   12/30/2022 4.5           CREATININE (mg/dL)   Date Value   84/69/6295 0.9        Documented patient preferred pharmacies:      [id number], [place] - [address]  Phone: [phone] Fax: [phone]

## 2023-01-26 ENCOUNTER — Ambulatory Visit: Payer: No Typology Code available for payment source

## 2023-01-26 DIAGNOSIS — I5033 Acute on chronic diastolic (congestive) heart failure: Secondary | ICD-10-CM

## 2023-01-26 NOTE — Progress Notes (Signed)
RESIDENT PRIMARY CARE NOTE       SUBJECTIVE  Cheryl Zimmerman is a 52 year old English-speaking female here for:    Chief concern(s): Dyspnea     #difficulty breathing  -yesterday noticed dyspnea  -goes up to bathroom and comes back "all out of breath;" last week could do this no problem, without any dyspnea  -bumped herself up to 3L right now, baseline is 0-2LNC (PRN, sleeping)  -feels very tired  -no chest pain/discomfort  -slept unwell last night, uncomfortable, dyspneic, slept in recliner last night because of it, little more leg swelling than usual  -hasn't been able to weight herself (no home scale, also can't stand well)  -no palpitations  -upon closed ended questioning, it seems as though she hasn't had any bumex since discharge, and she was discharged on 4mg /day  -this Clinical research associate spoke with pharmacy who informed us that bumex had in fact been picked up and presumably delivered to pt's home by one of pt's aids, though pt is adamant never received bumex  -5/2 went to [facility], went because felt like had increased leg swelling, pt thought was having blood clots, but notes they didn't scan her legs- was also sob at this time but felt like it got better      ROS - Pertinent symptoms as noted above, other ROS negative    Active Medical Issues:  Patient Active Problem List:     Dyspnea on minimal exertion     Suicidal behavior with attempted self-injury ([name])     Tobacco use disorder     Systolic congestive heart failure ([name])     Seizure disorder ([name])     Depressive disorder     Suicidal ideation     Afib ([name])     Alcohol use disorder     Borderline personality disorder ([name])     Chronic pain disorder     Morbid obesity ([name])     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)     PTSD (post-traumatic stress disorder)     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     (HFpEF) heart failure with preserved ejection fraction ([name])     MDD (major depressive disorder), recurrent, severe, with psychosis ([name])     Requires  continuous at home supplemental oxygen     Spinal stenosis of lumbar 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 claudication     Fall in home     Gait instability     Hypokalemia     Lung nodule     Stable angina     Chest pain, atypical     Obesity hypoventilation syndrome ([name])     Osteoarthritis of spine with radiculopathy, lumbar region     Chronic bilateral low back pain with bilateral sciatica     Hypoxia      Past medical, surgical, and social history reviewed.       OBJECTIVE  There were no vitals taken for this visit.  Most Recent Weight Reading(s)  12/31/22 : (!) 155.2 kg (342 lb 1.6 oz)  12/11/22 : (!) 158.8 kg (350 lb)  12/10/22 : (!) 158.8 kg (350 lb)  10/03/22 : (!) 158.8 kg (350 lb)  08/22/22 : (!) 156.5 kg (345 lb)      Most Recent BP Reading(s)  12/31/22 : 111/70  12/14/22 : 115/68  12/10/22 : 110/75  12/01/22 : 117/82  10/03/22 : 134/98      I personally reviewed relevant labs, imaging, and studies in EpicCare.  ASSESSMENT AND PLAN  Cheryl Zimmerman is a 52 year old female who presents via televisit and has an acute concern of dyspnea     (I50.33) Acute on chronic diastolic (congestive) heart failure ([name])  Comment: Pt w/ recent admission to [facility] for acute decompensated HF iso not having home diuresis. Was actively diuresed inpatient and discharged with Rx for bumex 4mg  daily though apparently has not had access to this medication for an unspecifiec amount of time, seemingly at least a couple weeks. She now has increased orthopnea, leg swelling, and reduced ET, has self uptitrated to 3LNC at rest whereas at baseline is on 0-2L NC PRN (e.g. sleep, sx).   Concern for acute on chronic HF exacerbation. Pt is not interested in presenting to ED at this time. [facility] one-time free of charge delivery to pt. Plan to take 4mg  this evening, then 4mg  the tomorrow morning, and 4mg  daily thereafter, f/u with this writer early next week in person    Follow up in approximately next  week in person    Discussed with Attending Dr. Benjamin Stain, MD  [name], MD, 01/26/2023    Resident Physician

## 2023-01-28 ENCOUNTER — Other Ambulatory Visit (HOSPITAL_BASED_OUTPATIENT_CLINIC_OR_DEPARTMENT_OTHER): Payer: Self-pay | Admitting: Family Medicine

## 2023-01-28 NOTE — Telephone Encounter (Signed)
PER Pharmacy, Cheryl Zimmerman is a 52 year old female has requested a refill of bumetanide.      Last Office Visit: 01/26/23 with [name]  Last Physical Exam: n/a    There are no preventive care reminders to display for this patient.    Other Med Adult:  Most Recent BP Reading(s)  12/31/22 : 111/70        Cholesterol (mg/dL)   Date Value   72/53/6644 263 (H)     LOW DENSITY LIPOPROTEIN DIRECT (mg/dL)   Date Value   03/47/4259 192 (H)     HIGH DENSITY LIPOPROTEIN (mg/dL)   Date Value   56/38/7564 40     TRIGLYCERIDES (mg/dL)   Date Value   33/29/5188 301 (H)         THYROID SCREEN TSH REFLEX FT4 (uIU/mL)   Date Value   07/15/2022 5.540 (H)         TSH (THYROID STIM HORMONE) (uIU/mL)   Date Value   12/10/2022 1.190       HEMOGLOBIN A1C (%)   Date Value   07/15/2022 6.5 (H)       No results found for: "POCA1C"      INR (no units)   Date Value   06/02/2011 1.0 (L)   06/02/2011 < 1.0 (*L)     INR CARE EVERYWHERE (no units)   Date Value   10/16/2021 1.0   09/23/2021 0.9   01/06/2021 1.1   11/11/2020 1.0   07/05/2020 0.9   07/04/2020 0.8 (L)   03/13/2020 1.0       SODIUM (mmol/L)   Date Value   12/30/2022 140       POTASSIUM (mmol/L)   Date Value   12/30/2022 4.5           CREATININE (mg/dL)   Date Value   41/66/0630 0.9       Documented patient preferred pharmacies:      [id number],  - [address]  Phone: [phone] Fax: [phone]

## 2023-02-01 ENCOUNTER — Encounter (HOSPITAL_BASED_OUTPATIENT_CLINIC_OR_DEPARTMENT_OTHER): Payer: Self-pay

## 2023-02-01 ENCOUNTER — Other Ambulatory Visit: Payer: Self-pay

## 2023-02-01 ENCOUNTER — Ambulatory Visit: Payer: No Typology Code available for payment source | Attending: Family Medicine

## 2023-02-01 VITALS — BP 117/79 | HR 82 | Temp 97.6°F | Wt 321.9 lb

## 2023-02-01 DIAGNOSIS — I5033 Acute on chronic diastolic (congestive) heart failure: Secondary | ICD-10-CM

## 2023-02-01 DIAGNOSIS — R112 Nausea with vomiting, unspecified: Secondary | ICD-10-CM | POA: Insufficient documentation

## 2023-02-01 DIAGNOSIS — G894 Chronic pain syndrome: Secondary | ICD-10-CM | POA: Diagnosis not present

## 2023-02-01 LAB — COMPREHENSIVE METABOLIC PANEL
ALANINE AMINOTRANSFERASE: 65 U/L — ABNORMAL HIGH (ref 12–45)
ALBUMIN: 4.6 g/dL (ref 3.4–5.2)
ALKALINE PHOSPHATASE: 203 U/L — ABNORMAL HIGH (ref 45–117)
ANION GAP: 17 mmol/L (ref 10–22)
ASPARTATE AMINOTRANSFERASE: 45 U/L — ABNORMAL HIGH (ref 8–34)
BILIRUBIN TOTAL: 0.6 mg/dL (ref 0.2–1.0)
BUN (UREA NITROGEN): 13 mg/dL (ref 7–18)
CALCIUM: 10.2 mg/dL (ref 8.5–10.5)
CARBON DIOXIDE: 27 mmol/L (ref 21–32)
CHLORIDE: 97 mmol/L — ABNORMAL LOW (ref 98–107)
CREATININE: 0.9 mg/dL (ref 0.4–1.2)
ESTIMATED GLOMERULAR FILT RATE: >60 mL/min (ref >60)
Glucose Random: 114 mg/dL (ref 74–160)
POTASSIUM: 3.8 mmol/L (ref 3.5–5.1)
SODIUM: 140 mmol/L (ref 136–145)
TOTAL PROTEIN: 8 g/dL (ref 6.4–8.2)

## 2023-02-01 LAB — CBC WITH PLATELET
ABSOLUTE NRBC COUNT: 0 10*3/uL (ref 0.0–0.0)
HEMATOCRIT: 51.6 % — ABNORMAL HIGH (ref 34.1–44.9)
HEMOGLOBIN: 16.8 g/dL — ABNORMAL HIGH (ref 11.2–15.7)
MEAN CORP HGB CONC: 32.6 g/dL (ref 31.0–37.0)
MEAN CORPUSCULAR HGB: 30.8 pg (ref 26.0–34.0)
MEAN CORPUSCULAR VOL: 94.7 fl (ref 80.0–100.0)
MEAN PLATELET VOLUME: 12 fL (ref 8.7–12.5)
NRBC %: 0 % (ref 0.0–0.0)
PLATELET COUNT: 349 10*3/uL (ref 150–400)
RBC DISTRIBUTION WIDTH STD DEV: 51.1 fL — ABNORMAL HIGH (ref 35.1–46.3)
RED BLOOD CELL COUNT: 5.45 M/uL — ABNORMAL HIGH (ref 3.90–5.20)
WHITE BLOOD CELL COUNT: 11.9 10*3/uL — ABNORMAL HIGH (ref 4.0–11.0)

## 2023-02-01 LAB — CORTISOL RANDOM: CORTISOL RANDOM: 15 ug/dL (ref 2.47–19.50)

## 2023-02-01 LAB — PHOSPHORUS MAGNESIUM
MAGNESIUM: 2 mg/dL (ref 1.6–2.6)
PHOSPHORUS: 2.8 mg/dL (ref 2.5–4.9)

## 2023-02-01 LAB — NT-PROBNP: NT-proBNP: <36 pg/mL (ref 0–125)

## 2023-02-01 LAB — THYROID SCREEN TSH REFLEX FT4: THYROID SCREEN TSH REFLEX FT4: 1.25 u[IU]/mL (ref 0.270–4.200)

## 2023-02-01 MED ORDER — ONDANSETRON 4 MG PO TBDP
4.00 mg | ORAL_TABLET | Freq: Once | ORAL | Status: AC
Start: 2023-02-01 — End: 2023-02-01
  Administered 2023-02-01: 4 mg via ORAL

## 2023-02-01 MED ORDER — BUPRENORPHINE HCL-NALOXONE HCL 8-2 MG SL FILM
1.0000 | ORAL_FILM | Freq: Three times a day (TID) | SUBLINGUAL | 0 refills | Status: DC
Start: 2023-02-01 — End: 2023-04-24

## 2023-02-01 MED ORDER — DULOXETINE HCL 30 MG PO CPEP
30.0000 mg | ORAL_CAPSULE | Freq: Every day | ORAL | 2 refills | Status: DC
Start: 2023-02-01 — End: 2023-05-30

## 2023-02-01 NOTE — Progress Notes (Signed)
RESIDENT PRIMARY CARE NOTE       SUBJECTIVE  Cheryl Zimmerman is a 52 year old English-speaking female here for:    Chief concern(s): Follow Up     #HFpEF  -please see note from week prior, though briefly pt appeared to be in acute on chronic decompensated HF last week, as did not have home bumex, was on 3L NC  -took 4mg  that night (night of visit last week), then has been taking 4mg /day since, was able to quickly wean back to her baseline 0-2L NC PRN  -D/c weight 155 kg (though this is likely a bed weight), today weight 322lbs/146kg (standing)  -last night slept in bed but on 5 pillows, with oxygen (this is near/at baseline), improvement from her recliner last week.    #nausea  #clamminess/hot flashes  -last two weeks randomly getting episodes of clamminess, hot flashes and nausea  -S/p total hysterectomy/BSO, though this was years prior  -no new medications or recent changes in diet/lifestyle  -is still eating but decreased PO intake and nausea sometimes worsened with po intake  -no sig abd pain nor dyspepsia  -no subjective weight loss nor other systemic sx  -feeling a little nauseous in clinic now    #Pain  -on suboxone 8 TID  -feels pain is not well managed; notes that when receives oxy or tramadol instead of one of her suboxone doses, her pain is much better controlled  -pain is diffuse, back, legs, chronic      ROS - Pertinent symptoms as noted above, other ROS negative    Active Medical Issues:  Patient Active Problem List:     Dyspnea on minimal exertion     Suicidal behavior with attempted self-injury ([name])     Tobacco use disorder     Systolic congestive heart failure ([name])     Seizure disorder ([name])     Depressive disorder     Suicidal ideation     Afib ([name])     Alcohol use disorder     Borderline personality disorder ([name])     Chronic pain disorder     Morbid obesity ([name])     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)     PTSD (post-traumatic stress disorder)     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      (HFpEF) heart failure with preserved ejection fraction ([name])     MDD (major depressive disorder), recurrent, severe, with psychosis ([name])     Requires continuous at home supplemental oxygen     Spinal stenosis of lumbar 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 claudication     Fall in home     Gait instability     Hypokalemia     Lung nodule     Stable angina     Chest pain, atypical     Obesity hypoventilation syndrome ([name])     Osteoarthritis of spine with radiculopathy, lumbar region     Chronic bilateral low back pain with bilateral sciatica     Hypoxia      Past medical, surgical, and social history reviewed.       OBJECTIVE  BP 117/79   Pulse 82   Temp 97.6 F (36.4 C) (Temporal)   Wt (!) 146 kg (321 lb 14 oz)   SpO2 94%   BMI 53.56 kg/m   Most Recent Weight Reading(s)  02/01/23 : (!) 146 kg (321 lb 14 oz)  12/31/22 : (!) 155.2 kg (342 lb 1.6 oz)  12/11/22 : (!) 158.8 kg (  350 lb)  12/10/22 : (!) 158.8 kg (350 lb)  10/03/22 : (!) 158.8 kg (350 lb)      Most Recent BP Reading(s)  02/01/23 : 117/79  12/31/22 : 111/70  12/14/22 : 115/68  12/10/22 : 110/75  12/01/22 : 117/82      Physical Exam  Constitutional:       General: She is not in acute distress.     Appearance: Normal appearance. She is not ill-appearing, toxic-appearing or diaphoretic.   HENT:      Head: Normocephalic and atraumatic.      Nose: No congestion or rhinorrhea.   Eyes:      General: No scleral icterus.        Right eye: No discharge.         Left eye: No discharge.      Conjunctiva/sclera: Conjunctivae normal.   Cardiovascular:      Rate and Rhythm: Normal rate and regular rhythm.   Pulmonary:      Effort: Pulmonary effort is normal. No respiratory distress.      Breath sounds: Normal breath sounds. No stridor. No wheezing or rales.   Abdominal:      General: There is no distension.      Palpations: Abdomen is soft.      Tenderness: There is no abdominal tenderness.   Musculoskeletal:      Comments: Trace to +1 edema b/l   Neurological:      Mental  Status: She is alert.      Comments: Grossly intact   Psychiatric:         Mood and Affect: Mood normal.         Behavior: Behavior normal.         Thought Content: Thought content normal.         Judgment: Judgment normal.           I personally reviewed relevant labs, imaging, and studies in EpicCare.       ASSESSMENT AND PLAN  Sidrah Deshpande is a 52 year old female who presents for f/u acute on chronic decompensated HF.    (I50.33) Acute on chronic diastolic (congestive) heart failure ([name])  (primary encounter diagnosis)  Comment: Pt now back to baseline 0-2L NC PRN, still with orthopnea though may be close to her baseline and has OHS and OSA contributory to orthopnea as well. Exam/fluid status challenging, weight standing was also equivocal as pt has boor balance, but was 146kg today. Will continue on bumex 4mg  daily pending labs/lytes today.  Plan: COMPREHENSIVE METABOLIC PANEL, NT-PROBNP,         PHOSPHORUS MAGNESIUM    (R11.2) Nausea and vomiting, unspecified vomiting type  Comment: Acute, 2 weeks now, associated with clamminess/hot flashes, unclear etiology and is still taking in PO. No abd pain and exam benign. Will administer zofran in clinic today, obtain basic lab workup, and reasess.  Plan: ondansetron (ZOFRAN-ODT) disintegrating tablet         4 mg, THYROID SCREEN TSH REFLEX FT4, CBC WITH         PLATELET, CORTISOL RANDOM    (G89.4) Chronic pain disorder  Comment: Pain somewhat controlled on suboxone 8 TID, perhaps expressed some interest in sublocade, which would be good option for patient. With some acute neuropathic pain that is unmanaged today, will trial duloxetine (which also may in part help with nausea/hot flashes above)  Plan: DULoxetine (CYMBALTA) 30 MG capsule,         buprenorphine-naloxone (SUBOXONE) 8-2  MG         sublingual film    (E66.01) Morbid obesity ([name])  Comment: risks/benefits of GLP discussed, pt is appropriate candidate for weight loss, pt is interested in trying. Discussed  should not start now as is currently having nausea, but will Rx as will probably need prior auth/approval and may take time.  Plan: semaglutide,0.25 or 0.5 mg/dose, (OZEMPIC)         pen-injector    Follow up in approximately 5 weeks or sooner PRN  ____________    Discussed with Attending Dr. Mellody Drown, MD  [name], MD, 02/07/2023    Resident Physician

## 2023-02-02 ENCOUNTER — Telehealth (HOSPITAL_BASED_OUTPATIENT_CLINIC_OR_DEPARTMENT_OTHER): Payer: Self-pay

## 2023-02-02 NOTE — Telephone Encounter (Signed)
RN attempted to return call to Case manager [name].  Unable to reach at this time.   VML with call back number to clinic.   Message sent to Dr. Otilio Miu inquiring if new appointment needed for referral.    Ronalee Red, RN, 02/02/2023

## 2023-02-02 NOTE — Telephone Encounter (Signed)
Referral was faxed to [facility] on 12/15/22.  RN called [facility] at [phone].   Spoke to [place] and provided pt information.   Informed that a referral was fax on 12/15/22 and asking for status of referral.   Jeannett Senior forwarded message to intake dept along with phone number to return call to clinic.    Ronalee Red, RN, 02/02/2023

## 2023-02-02 NOTE — Telephone Encounter (Signed)
-----   Message from [place] sent at 02/02/2023 12:51 PM EDT -----  Regarding: Case Manager Request  Araly Gick [id number], 52 year old, female    Calls today: Case Manager   Name of person calling Leeroy Bock, Case Manager   Reason for call Requesting to know if the the provider can get the patient set up with [facility] services because the patient is struggling to take her medication on her own ([facility] orders placed by provider in January and march 2024). The patient also recently had a visit with Dr. Otilio Miu yesterday but forgot to request for a referral for physical therapy and would like to know if one can be placed for her.   Return phone number [phone] (chelsea's number) or [phone] (patients number)     NOTE: Urgent calls are a warm handoff        Person calling on behalf of patient: Loel Dubonnet NUMBER: [phone]  Best time to call back: Anytime   Cell phone:   Other phone:    Patient's language of care: English    Patient does not need an interpreter.    Patient's PCP: [name], MD    Primary Care Home Site:  [facility]

## 2023-02-03 ENCOUNTER — Encounter (HOSPITAL_BASED_OUTPATIENT_CLINIC_OR_DEPARTMENT_OTHER): Payer: Self-pay

## 2023-02-03 NOTE — Progress Notes (Signed)
Cheryl Zimmerman [id number]     [birth year] 52 year old    Patient PCP: [name], MD    Pt/Parent drop off form/letter for: Dr. Otilio Miu     Did Patient fill in the information required of them on form, and sign where applicable? No  Signed Consent form on file or attach? No  If not have patient filled out [place] release form     Does Patient need to be seen before provider can complete the form    What Type of Form is it? Other:Medical Summary Form     Date Patient needs completed by: n/a  Patient's Contact telephone number:[phone]    Patient Preferred way to release the form: Fax [phone]    Incomplete [facility] Medical Summary Form/Letter placed in [facility] box for review.  (Located in the Team office)  Suan Halter, 02/03/2023

## 2023-02-04 NOTE — Progress Notes (Signed)
Form placed in [name], MD' s for its completion.     Oliver Pila [place], 02/04/2023.

## 2023-02-06 ENCOUNTER — Telehealth (HOSPITAL_BASED_OUTPATIENT_CLINIC_OR_DEPARTMENT_OTHER): Payer: Self-pay

## 2023-02-06 NOTE — Telephone Encounter (Signed)
-----   Message from [place] P sent at 02/06/2023 11:44 AM EDT -----  Regarding: today for test result lag  Contact: [phone]  [name],  [id number],  52 year old,  female  Telephone Information:  Home Phone      [phone]  Work Phone      Not on file.  Mobile          [phone]    Patient's PCP: [name], MD    Patient's language of care:   English    Person calling on behalf of patient: Patient (self    Calls today for test result lag    CALL BACK NUMBER: [phone]

## 2023-02-06 NOTE — Telephone Encounter (Signed)
RN returned call to pt.   Confirmed pt name and DOB.   Pt states that she wanted to follow up regarding test results and medication for weight loss  Message sent to provider to remove results.     Ronalee Red, RN, 02/06/2023

## 2023-02-06 NOTE — Telephone Encounter (Deleted)
-----   Message from [place] P sent at 02/06/2023 11:44 AM EDT -----  Regarding: today for test result lag  Contact: [phone]  Cheryl Zimmerman,  [id number],  51 year old,  female  Telephone Information:  Home Phone      [phone]  Work Phone      Not on file.  Mobile          [phone]    Patient's PCP: [name], Ian, MD    Patient's language of care:   English    Person calling on behalf of patient: Patient (self    Calls today for test result lag    CALL BACK NUMBER: [phone]

## 2023-02-07 MED ORDER — SEMAGLUTIDE(0.25 OR 0.5MG/DOS) 2 MG/3ML SC SOPN
0.25 mg | PEN_INJECTOR | SUBCUTANEOUS | 0 refills | Status: AC
Start: 2023-02-07 — End: 2023-03-09

## 2023-02-07 NOTE — Progress Notes (Signed)
PRECEPTOR NOTE  I personally saw and evaluated the patient on 02/01/23. I reviewed findings with resident .  I confirm the key elements of history and physical exam as described in resident's note.  I agree with the assessment and plan as described below.  Please see resident's note for further details.    Electronically signed by: Francene Castle, MD , 02/07/2023 7:53 PM

## 2023-02-08 ENCOUNTER — Other Ambulatory Visit (HOSPITAL_BASED_OUTPATIENT_CLINIC_OR_DEPARTMENT_OTHER): Payer: Self-pay

## 2023-02-08 ENCOUNTER — Telehealth (HOSPITAL_BASED_OUTPATIENT_CLINIC_OR_DEPARTMENT_OTHER): Payer: Self-pay | Admitting: Registered Nurse

## 2023-02-08 NOTE — Telephone Encounter (Signed)
Pt returning call.  Doesn't want to take Ozempic injection as she doesn't do well with needles and doesn't want to stick herself.  Has hx of severe gastritis.  Wants to lose weight but in pill form.  [facility] [place]  Advised will route message to Dr. Otilio Miu for input.  Weyman Rodney, RN 02/08/23 2:17 PM

## 2023-02-08 NOTE — Telephone Encounter (Signed)
PER Pharmacy, Cheryl Zimmerman is a 52 year old female has requested a refill of      -  Protonix       Last Office Visit: 02/01/23 with PCP       There are no preventive care reminders to display for this patient.     Other Med Adult:  Most Recent BP Reading(s)  02/01/23 : 117/79        Cholesterol (mg/dL)   Date Value   28/41/3244 263 (H)     LOW DENSITY LIPOPROTEIN DIRECT (mg/dL)   Date Value   09/21/7251 192 (H)     HIGH DENSITY LIPOPROTEIN (mg/dL)   Date Value   66/44/0347 40     TRIGLYCERIDES (mg/dL)   Date Value   42/59/5638 301 (H)         THYROID SCREEN TSH REFLEX FT4 (uIU/mL)   Date Value   02/01/2023 1.250         TSH (THYROID STIM HORMONE) (uIU/mL)   Date Value   12/10/2022 1.190       HEMOGLOBIN A1C (%)   Date Value   07/15/2022 6.5 (H)       No results found for: "POCA1C"      INR (no units)   Date Value   06/02/2011 1.0 (L)   06/02/2011 < 1.0 (*L)     INR CARE EVERYWHERE (no units)   Date Value   10/16/2021 1.0   09/23/2021 0.9   01/06/2021 1.1   11/11/2020 1.0   07/05/2020 0.9   07/04/2020 0.8 (L)   03/13/2020 1.0       SODIUM (mmol/L)   Date Value   02/01/2023 140       POTASSIUM (mmol/L)   Date Value   02/01/2023 3.8           CREATININE (mg/dL)   Date Value   75/64/3329 0.9        Documented patient preferred pharmacies:    [id number] [facility], [place] - [address]  Phone: [phone] Fax: [phone]

## 2023-02-08 NOTE — Telephone Encounter (Signed)
Attempted to call pt. No answer. Left message on machine for callback to [facility].    Weyman Rodney, RN 02/08/23 1:59 PM

## 2023-02-08 NOTE — Telephone Encounter (Signed)
-----   Message from [place] M sent at 02/08/2023  1:08 PM EDT -----  Regarding: Question about Ozempic pen injection.  Cailin Freeze [id number], 52 year old, female    Calls today: Clinical Questions (NON-SICK CLINICAL QUESTIONS ONLY)      Need to talk to the Nurse or Doctor. She needs medication instead of needle.    semaglutide,0.25 or 0.5 mg/dose, (OZEMPIC) pen-injector      Person calling on behalf of patient: Patient (self)    CALL BACK NUMBER: [phone]   Best time to call back: Any time  Cell phone:   Other phone:    Patient's language of care: English    Patient does not need an interpreter.    Patient's PCP: [name], MD    Primary Care Home Site:  [facility]

## 2023-02-08 NOTE — Progress Notes (Signed)
PRECEPTOR NOTE  On the day of the patient's visit, I personally reviewed the key elements of history, physical exam, and findings as described in resident's note with the resident.    I agree with the assessment and plan as described in resident's note.  Please see resident's note for further details.      On day of visit, I directly observed resident do extensive care coordination with pharmacy to get diuretic delivered.  Pt declined presenting to ER for likely CHF exacerbation, will do trial of home diuresis.  Has been referred for visiting nurse.

## 2023-02-09 ENCOUNTER — Other Ambulatory Visit (HOSPITAL_BASED_OUTPATIENT_CLINIC_OR_DEPARTMENT_OTHER): Payer: Self-pay

## 2023-02-09 DIAGNOSIS — G894 Chronic pain syndrome: Secondary | ICD-10-CM

## 2023-02-09 MED ORDER — BUMETANIDE 2 MG PO TABS
4.0000 mg | ORAL_TABLET | Freq: Every day | ORAL | 3 refills | Status: DC
Start: 2023-02-09 — End: 2023-05-30

## 2023-02-10 ENCOUNTER — Telehealth (HOSPITAL_BASED_OUTPATIENT_CLINIC_OR_DEPARTMENT_OTHER): Payer: Self-pay

## 2023-02-10 NOTE — Telephone Encounter (Signed)
RN called [facility] to check on status on [facility] referral faxed on 12/15/22.  Provided pt name and DOB.   Was informed that they will look into status of referral and call RN back.   RN provided call back number to clinic.     Ronalee Red, RN, 02/10/2023

## 2023-02-10 NOTE — Telephone Encounter (Signed)
RN faxed VNA and PT referral to [facility] at [phone].  Fax confirmation received.     Ronalee Red, RN, 02/10/2023

## 2023-02-10 NOTE — Telephone Encounter (Signed)
-----   Message from [place] [facility] sent at 02/10/2023 11:20 AM EDT -----  Regarding: [facility] requesting a call back      Cheryl Zimmerman [id number], 52 year old, female    Calls today: VNA/Nursing Home/Other Agency: non urgent call    Name of [facility]   Name of person calling Marchelle Folks   Reason for call FYI, referral for patient has been declined, would like a call back to explain why they are not able to schedule the patient   Return phone number [phone]    NOTE: Urgent calls are a warm handoff        Person calling on behalf of patient: [facility] calling     CALL BACK NUMBER:   Best time to call back:   Cell phone:   Other phone:    Patient's language of care: English    Patient does not need an interpreter.    Patient's PCP: [name], MD    Primary Care Home Site:  [facility]

## 2023-02-10 NOTE — Telephone Encounter (Signed)
RN returned call to [place].   Confirmed pt name and DOB.   Marchelle Folks unable to accept pt at [facility] as they do not service pts area [place]).       RN faxed referral to [facility] at [phone].  Fax confirmation received.     Ronalee Red, RN, 02/10/2023

## 2023-02-12 ENCOUNTER — Other Ambulatory Visit (HOSPITAL_BASED_OUTPATIENT_CLINIC_OR_DEPARTMENT_OTHER): Payer: Self-pay | Admitting: Family Medicine

## 2023-02-13 NOTE — Telephone Encounter (Signed)
PER Pharmacy, Cheryl Zimmerman is a 52 year old female has requested a refill of      - methocarbamol (ROBAXIN) 500 MG tablet      Last Office Visit: 02/01/23 with pcp  Last Physical Exam: never done     There are no preventive care reminders to display for this patient.     Other Med Adult:  Most Recent BP Reading(s)  02/01/23 : 117/79        Cholesterol (mg/dL)   Date Value   16/06/9603 263 (H)     LOW DENSITY LIPOPROTEIN DIRECT (mg/dL)   Date Value   54/05/8118 192 (H)     HIGH DENSITY LIPOPROTEIN (mg/dL)   Date Value   14/78/2956 40     TRIGLYCERIDES (mg/dL)   Date Value   21/30/8657 301 (H)         THYROID SCREEN TSH REFLEX FT4 (uIU/mL)   Date Value   02/01/2023 1.250         TSH (THYROID STIM HORMONE) (uIU/mL)   Date Value   12/10/2022 1.190       HEMOGLOBIN A1C (%)   Date Value   07/15/2022 6.5 (H)       No results found for: "POCA1C"      INR (no units)   Date Value   06/02/2011 1.0 (L)   06/02/2011 < 1.0 (*L)     INR CARE EVERYWHERE (no units)   Date Value   10/16/2021 1.0   09/23/2021 0.9   01/06/2021 1.1   11/11/2020 1.0   07/05/2020 0.9   07/04/2020 0.8 (L)   03/13/2020 1.0       SODIUM (mmol/L)   Date Value   02/01/2023 140       POTASSIUM (mmol/L)   Date Value   02/01/2023 3.8           CREATININE (mg/dL)   Date Value   84/69/6295 0.9        Documented patient preferred pharmacies:      [id number] [place], [place] - [address]  Phone: [phone] Fax: [phone]

## 2023-02-21 NOTE — Telephone Encounter (Signed)
Hello,    Rybelsus would require a PA and is just covered for the diagnosis of diabetes.    340b price would be $341

## 2023-02-25 NOTE — Progress Notes (Signed)
Form faxed to DME department to initiate patients medical device request.     Norville Haggard, 02/25/2023

## 2023-03-01 ENCOUNTER — Ambulatory Visit (HOSPITAL_BASED_OUTPATIENT_CLINIC_OR_DEPARTMENT_OTHER): Payer: Self-pay | Admitting: Ambulatory Care

## 2023-03-01 NOTE — Telephone Encounter (Signed)
Message from [facility] sent at 03/01/2023  1:18 PM EDT    Summary: Not feeling well (pt waited 45 minutes on Nurse que)    Cheryl Zimmerman [id number], 52 year old, female    Calls today: Sick      Pt has been transferred to Nurse advice line and she was holding 45 minutes, no response.  Pt said she is not felling well.  Person calling on behalf of patient: Patient (self)    CALL BACK NUMBER: [phone]  Patient's language of care: English    Patient does not need an interpreter.    Patient's PCP: [name], MD    Primary Care Home Site:  [facility]

## 2023-03-01 NOTE — Telephone Encounter (Signed)
Called [phone], but phone number is out of service.    Called # [phone], but phone number is out of service.    Called # [phone] [facility] Phone), but no answer.  Left VM msg to call office back

## 2023-03-03 ENCOUNTER — Ambulatory Visit (HOSPITAL_BASED_OUTPATIENT_CLINIC_OR_DEPARTMENT_OTHER): Payer: Self-pay | Admitting: Registered Nurse

## 2023-03-03 NOTE — Telephone Encounter (Addendum)
Answer Assessment - Initial Assessment Questions  [facility]  Upper abd/chest pain- has been having the left sided cp along with left arm pain for days- but per pt 4 EKG-unremarkable  No clot/PE  Work up unremarkable  Including ct/xray/US/ekg  No gall stones  But labs pertaining to liver/pancreas/gallbladder high and so pt told to follow up  Pt was supposed to be admitted but declined  Currently no fever  Rx'd famotidine and carafate- cannot get these until monday    Protocols used: Abdominal Pain - Upper-A-[place]    Advised per nursing triage protocol.  Verbalized understanding and agreement with instructions and disposition.     Recommended disposition for patient:Disposition: Go to ED Now    If patient referred to UC/ED advised that they may require further follow up and testing after the visit with their primary care office.     Instructed patient to call back for any new, worsening, or worrisome symptoms or concerns any time day or night.

## 2023-03-04 ENCOUNTER — Telehealth (HOSPITAL_BASED_OUTPATIENT_CLINIC_OR_DEPARTMENT_OTHER): Payer: Self-pay

## 2023-03-04 NOTE — Telephone Encounter (Signed)
[facility] Outreach  Call made to Deshon Hovsepian to schedule an appointment  for paper work . No interpreter needed.   Patient (self) did not answer at [phone] ; unable to leave a message as phone is disconnected.  I have completed the following communication and reminder steps: Patient notified by telephone  Luana Shu, Albert, 03/04/2023    On behalf of PCP: [name], MD

## 2023-03-06 ENCOUNTER — Other Ambulatory Visit (HOSPITAL_BASED_OUTPATIENT_CLINIC_OR_DEPARTMENT_OTHER): Payer: Self-pay

## 2023-03-06 NOTE — Telephone Encounter (Signed)
Reason for Disposition  . MILD TO MODERATE constant pain lasting > 2 hours     Left hospital last week despite them wanted to admit her    Protocols used: Abdominal Pain - Upper-A-OH

## 2023-03-07 ENCOUNTER — Other Ambulatory Visit (HOSPITAL_BASED_OUTPATIENT_CLINIC_OR_DEPARTMENT_OTHER): Payer: Self-pay

## 2023-03-07 NOTE — Telephone Encounter (Signed)
PER Patient (self), Cheryl Zimmerman is a 52 year old female has requested a refill of melatonin.      Last Office Visit: 02/01/2023 with dwyer,i  Last Physical Exam: n/a    There are no preventive care reminders to display for this patient.    Other Med Adult:  Most Recent BP Reading(s)  02/01/23 : 117/79        Cholesterol (mg/dL)   Date Value   29/56/2130 263 (H)     LOW DENSITY LIPOPROTEIN DIRECT (mg/dL)   Date Value   86/57/8469 192 (H)     HIGH DENSITY LIPOPROTEIN (mg/dL)   Date Value   62/95/2841 40     TRIGLYCERIDES (mg/dL)   Date Value   32/44/0102 301 (H)         THYROID SCREEN TSH REFLEX FT4 (uIU/mL)   Date Value   02/01/2023 1.250         TSH (THYROID STIM HORMONE) (uIU/mL)   Date Value   12/10/2022 1.190       HEMOGLOBIN A1C (%)   Date Value   07/15/2022 6.5 (H)       No results found for: "POCA1C"      INR (no units)   Date Value   06/02/2011 1.0 (L)   06/02/2011 < 1.0 (*L)     INR CARE EVERYWHERE (no units)   Date Value   10/16/2021 1.0   09/23/2021 0.9   01/06/2021 1.1   11/11/2020 1.0   07/05/2020 0.9   07/04/2020 0.8 (L)   03/13/2020 1.0       SODIUM (mmol/L)   Date Value   02/01/2023 140       POTASSIUM (mmol/L)   Date Value   02/01/2023 3.8           CREATININE (mg/dL)   Date Value   72/53/6644 0.9       Documented patient preferred pharmacies:

## 2023-03-08 ENCOUNTER — Encounter (HOSPITAL_BASED_OUTPATIENT_CLINIC_OR_DEPARTMENT_OTHER): Payer: Self-pay

## 2023-03-08 NOTE — Progress Notes (Signed)
Called pt to f/u abd pain, though phone number now out of service/changed? Will send pt letter and/or mychart message to get new contact info.

## 2023-03-08 NOTE — Telephone Encounter (Signed)
PER Pharmacy, Cheryl Zimmerman is a 52 year old female has requested a refill of      - Famotidine, Vitamin B-1,  Robaxin and Seroquel.     Last Office Visit: 02/01/23 with pcp  Last Physical Exam: never done     There are no preventive care reminders to display for this patient.     Other Med Adult:  Most Recent BP Reading(s)  02/01/23 : 117/79        Cholesterol (mg/dL)   Date Value   16/06/9603 263 (H)     LOW DENSITY LIPOPROTEIN DIRECT (mg/dL)   Date Value   54/05/8118 192 (H)     HIGH DENSITY LIPOPROTEIN (mg/dL)   Date Value   14/78/2956 40     TRIGLYCERIDES (mg/dL)   Date Value   21/30/8657 301 (H)         THYROID SCREEN TSH REFLEX FT4 (uIU/mL)   Date Value   02/01/2023 1.250         TSH (THYROID STIM HORMONE) (uIU/mL)   Date Value   12/10/2022 1.190       HEMOGLOBIN A1C (%)   Date Value   07/15/2022 6.5 (H)       No results found for: "POCA1C"      INR (no units)   Date Value   06/02/2011 1.0 (L)   06/02/2011 < 1.0 (*L)     INR CARE EVERYWHERE (no units)   Date Value   10/16/2021 1.0   09/23/2021 0.9   01/06/2021 1.1   11/11/2020 1.0   07/05/2020 0.9   07/04/2020 0.8 (L)   03/13/2020 1.0       SODIUM (mmol/L)   Date Value   02/01/2023 140       POTASSIUM (mmol/L)   Date Value   02/01/2023 3.8           CREATININE (mg/dL)   Date Value   84/69/6295 0.9        Documented patient preferred pharmacies:    [id number] [place], [place] - [address]  Phone: [phone] Fax: [phone]

## 2023-03-13 ENCOUNTER — Other Ambulatory Visit (HOSPITAL_BASED_OUTPATIENT_CLINIC_OR_DEPARTMENT_OTHER): Payer: Self-pay | Admitting: Family Medicine

## 2023-03-13 NOTE — Telephone Encounter (Signed)
PER Pharmacy, Cheryl Zimmerman is a 52 year old female has requested a refill of oxcarbazine.      Last Office Visit: 02/01/2023 [name], MD     Last Physical Exam: n/a    There are no preventive care reminders to display for this patient.    Other Med Adult:  Most Recent BP Reading(s)  02/01/23 : 117/79        Cholesterol (mg/dL)   Date Value   57/84/6962 263 (H)     LOW DENSITY LIPOPROTEIN DIRECT (mg/dL)   Date Value   95/28/4132 192 (H)     HIGH DENSITY LIPOPROTEIN (mg/dL)   Date Value   44/09/270 40     TRIGLYCERIDES (mg/dL)   Date Value   53/66/4403 301 (H)         THYROID SCREEN TSH REFLEX FT4 (uIU/mL)   Date Value   02/01/2023 1.250         TSH (THYROID STIM HORMONE) (uIU/mL)   Date Value   12/10/2022 1.190       HEMOGLOBIN A1C (%)   Date Value   07/15/2022 6.5 (H)       No results found for: "POCA1C"      INR (no units)   Date Value   06/02/2011 1.0 (L)   06/02/2011 < 1.0 (*L)     INR CARE EVERYWHERE (no units)   Date Value   10/16/2021 1.0   09/23/2021 0.9   01/06/2021 1.1   11/11/2020 1.0   07/05/2020 0.9   07/04/2020 0.8 (L)   03/13/2020 1.0       SODIUM (mmol/L)   Date Value   02/01/2023 140       POTASSIUM (mmol/L)   Date Value   02/01/2023 3.8           CREATININE (mg/dL)   Date Value   47/42/5956 0.9       Documented patient preferred pharmacies:    [id number] [place], [place] - [address]  Phone: [phone] Fax: [phone]

## 2023-03-31 ENCOUNTER — Other Ambulatory Visit (HOSPITAL_BASED_OUTPATIENT_CLINIC_OR_DEPARTMENT_OTHER): Payer: Self-pay

## 2023-04-06 MED ORDER — SUCRALFATE 1 G PO TABS
1.0000 g | ORAL_TABLET | Freq: Four times a day (QID) | ORAL | 3 refills | Status: DC
Start: 2023-04-06 — End: 2023-10-25

## 2023-04-06 MED ORDER — OMEPRAZOLE 40 MG PO CPDR
40.0000 mg | DELAYED_RELEASE_CAPSULE | Freq: Every day | ORAL | 3 refills | Status: DC
Start: 2023-04-06 — End: 2023-10-25

## 2023-04-10 ENCOUNTER — Other Ambulatory Visit (HOSPITAL_BASED_OUTPATIENT_CLINIC_OR_DEPARTMENT_OTHER): Payer: Self-pay

## 2023-04-10 NOTE — Telephone Encounter (Signed)
PER Patient (self), Cheryl Zimmerman is a 52 year old female has requested a refill of      -  Oxcarbazepine        Last Office Visit: 02/01/23 with PCP       There are no preventive care reminders to display for this patient.     Other Med Adult:  Most Recent BP Reading(s)  02/01/23 : 117/79        Cholesterol (mg/dL)   Date Value   16/60/6301 263 (H)     LOW DENSITY LIPOPROTEIN DIRECT (mg/dL)   Date Value   60/06/9322 192 (H)     HIGH DENSITY LIPOPROTEIN (mg/dL)   Date Value   55/73/2202 40     TRIGLYCERIDES (mg/dL)   Date Value   54/27/0623 301 (H)         THYROID SCREEN TSH REFLEX FT4 (uIU/mL)   Date Value   02/01/2023 1.250         TSH (THYROID STIM HORMONE) (uIU/mL)   Date Value   12/10/2022 1.190       HEMOGLOBIN A1C (%)   Date Value   07/15/2022 6.5 (H)       No results found for: "POCA1C"      INR (no units)   Date Value   06/02/2011 1.0 (L)   06/02/2011 < 1.0 (*L)     INR CARE EVERYWHERE (no units)   Date Value   10/16/2021 1.0   09/23/2021 0.9   01/06/2021 1.1   11/11/2020 1.0   07/05/2020 0.9   07/04/2020 0.8 (L)   03/13/2020 1.0       SODIUM (mmol/L)   Date Value   02/01/2023 140       POTASSIUM (mmol/L)   Date Value   02/01/2023 3.8           CREATININE (mg/dL)   Date Value   76/28/3151 0.9        Documented patient preferred pharmacies:        [id number] [place], [place] - [address]  Phone: [phone] Fax: [phone]

## 2023-04-11 ENCOUNTER — Other Ambulatory Visit (HOSPITAL_BASED_OUTPATIENT_CLINIC_OR_DEPARTMENT_OTHER): Payer: Self-pay

## 2023-04-11 NOTE — Telephone Encounter (Signed)
PER Patient (self), Cheryl Zimmerman is a 52 year old female has requested a refill of baclofen, prozac and buspar      Last Office Visit: 02/01/2023 with dwyer  Last Physical Exam: n/a    There are no preventive care reminders to display for this patient.    Other Med Adult:  Most Recent BP Reading(s)  02/01/23 : 117/79        Cholesterol (mg/dL)   Date Value   16/06/9603 263 (H)     LOW DENSITY LIPOPROTEIN DIRECT (mg/dL)   Date Value   54/05/8118 192 (H)     HIGH DENSITY LIPOPROTEIN (mg/dL)   Date Value   14/78/2956 40     TRIGLYCERIDES (mg/dL)   Date Value   21/30/8657 301 (H)         THYROID SCREEN TSH REFLEX FT4 (uIU/mL)   Date Value   02/01/2023 1.250         TSH (THYROID STIM HORMONE) (uIU/mL)   Date Value   12/10/2022 1.190       HEMOGLOBIN A1C (%)   Date Value   07/15/2022 6.5 (H)       No results found for: "POCA1C"      INR (no units)   Date Value   06/02/2011 1.0 (L)   06/02/2011 < 1.0 (*L)     INR CARE EVERYWHERE (no units)   Date Value   10/16/2021 1.0   09/23/2021 0.9   01/06/2021 1.1   11/11/2020 1.0   07/05/2020 0.9   07/04/2020 0.8 (L)   03/13/2020 1.0       SODIUM (mmol/L)   Date Value   02/01/2023 140       POTASSIUM (mmol/L)   Date Value   02/01/2023 3.8           CREATININE (mg/dL)   Date Value   84/69/6295 0.9       Documented patient preferred pharmacies:

## 2023-04-12 NOTE — Telephone Encounter (Signed)
Hello,    Patient calling stating she is without baclofen for 3 days and in a lot of pain.  Please review request

## 2023-04-13 NOTE — Telephone Encounter (Signed)
Patient is calling back requesting refills, please review it.

## 2023-04-14 NOTE — Telephone Encounter (Signed)
Patient has called multiple times today. Paged MD.

## 2023-04-14 NOTE — Telephone Encounter (Signed)
Patient is calling back, please review it.

## 2023-04-14 NOTE — Telephone Encounter (Signed)
Patient call again regarding medicaiton

## 2023-04-17 ENCOUNTER — Other Ambulatory Visit (HOSPITAL_BASED_OUTPATIENT_CLINIC_OR_DEPARTMENT_OTHER): Payer: Self-pay

## 2023-04-17 DIAGNOSIS — G894 Chronic pain syndrome: Secondary | ICD-10-CM

## 2023-04-17 NOTE — Telephone Encounter (Signed)
PER Pharmacy, Cheryl Zimmerman is a 52 year old female has requested a refill of      - buprenorphine-naloxone (SUBOXONE) 8-2 MG sublingual film      Last Office Visit: 02/01/23 with pcp  Last Physical Exam: never done     There are no preventive care reminders to display for this patient.     Other Med Adult:  Most Recent BP Reading(s)  02/01/23 : 117/79        Cholesterol (mg/dL)   Date Value   03/47/4259 263 (H)     LOW DENSITY LIPOPROTEIN DIRECT (mg/dL)   Date Value   56/38/7564 192 (H)     HIGH DENSITY LIPOPROTEIN (mg/dL)   Date Value   33/29/5188 40     TRIGLYCERIDES (mg/dL)   Date Value   41/66/0630 301 (H)         THYROID SCREEN TSH REFLEX FT4 (uIU/mL)   Date Value   02/01/2023 1.250         TSH (THYROID STIM HORMONE) (uIU/mL)   Date Value   12/10/2022 1.190       HEMOGLOBIN A1C (%)   Date Value   07/15/2022 6.5 (H)       No results found for: "POCA1C"      INR (no units)   Date Value   06/02/2011 1.0 (L)   06/02/2011 < 1.0 (*L)     INR CARE EVERYWHERE (no units)   Date Value   10/16/2021 1.0   09/23/2021 0.9   01/06/2021 1.1   11/11/2020 1.0   07/05/2020 0.9   07/04/2020 0.8 (L)   03/13/2020 1.0       SODIUM (mmol/L)   Date Value   02/01/2023 140       POTASSIUM (mmol/L)   Date Value   02/01/2023 3.8           CREATININE (mg/dL)   Date Value   16/09/930 0.9        Documented patient preferred pharmacies:      [id number] [place], [place] - [address]  Phone: [phone] Fax: [phone]

## 2023-04-18 NOTE — Telephone Encounter (Signed)
Hello,    Pt is calling again , pleas review

## 2023-05-09 ENCOUNTER — Other Ambulatory Visit (HOSPITAL_BASED_OUTPATIENT_CLINIC_OR_DEPARTMENT_OTHER): Payer: Self-pay

## 2023-05-11 ENCOUNTER — Other Ambulatory Visit (HOSPITAL_BASED_OUTPATIENT_CLINIC_OR_DEPARTMENT_OTHER): Payer: Self-pay

## 2023-05-11 NOTE — Telephone Encounter (Signed)
90 day supply being requested please review          [id number] [place], [place] - [address]  Phone: [phone] Fax: [phone]

## 2023-05-19 ENCOUNTER — Telehealth (HOSPITAL_BASED_OUTPATIENT_CLINIC_OR_DEPARTMENT_OTHER): Payer: Self-pay | Admitting: Registered Nurse

## 2023-05-19 NOTE — Telephone Encounter (Signed)
Patient requested appointment via televisit for refill.

## 2023-05-23 ENCOUNTER — Ambulatory Visit: Payer: No Typology Code available for payment source | Admitting: Family

## 2023-05-23 DIAGNOSIS — Z5321 Procedure and treatment not carried out due to patient leaving prior to being seen by health care provider: Secondary | ICD-10-CM

## 2023-05-23 NOTE — Progress Notes (Signed)
Pt not available on mend.   Called twice. Unable to leave VM  Patient left without being seen.

## 2023-05-28 ENCOUNTER — Inpatient Hospital Stay
Admission: EM | Admit: 2023-05-28 | Discharge: 2023-06-02 | DRG: 917 | Disposition: A | Discharge status: Home / Self Care | Payer: No Typology Code available for payment source | Attending: Internal Medicine | Admitting: Internal Medicine

## 2023-05-28 ENCOUNTER — Encounter (HOSPITAL_BASED_OUTPATIENT_CLINIC_OR_DEPARTMENT_OTHER): Payer: Self-pay

## 2023-05-28 ENCOUNTER — Other Ambulatory Visit: Payer: Self-pay

## 2023-05-28 DIAGNOSIS — F4310 Post-traumatic stress disorder, unspecified: Secondary | ICD-10-CM | POA: Diagnosis present

## 2023-05-28 DIAGNOSIS — F603 Borderline personality disorder: Secondary | ICD-10-CM

## 2023-05-28 DIAGNOSIS — J9602 Acute respiratory failure with hypercapnia: Secondary | ICD-10-CM | POA: Diagnosis present

## 2023-05-28 DIAGNOSIS — G9341 Metabolic encephalopathy: Secondary | ICD-10-CM | POA: Diagnosis not present

## 2023-05-28 DIAGNOSIS — Y929 Unspecified place or not applicable: Secondary | ICD-10-CM | POA: Diagnosis not present

## 2023-05-28 DIAGNOSIS — R079 Chest pain, unspecified: Secondary | ICD-10-CM

## 2023-05-28 DIAGNOSIS — I480 Paroxysmal atrial fibrillation: Secondary | ICD-10-CM | POA: Diagnosis not present

## 2023-05-28 DIAGNOSIS — G4733 Obstructive sleep apnea (adult) (pediatric): Secondary | ICD-10-CM

## 2023-05-28 DIAGNOSIS — R330 Drug induced retention of urine: Secondary | ICD-10-CM | POA: Diagnosis not present

## 2023-05-28 DIAGNOSIS — F1490 Cocaine use, unspecified, uncomplicated: Secondary | ICD-10-CM | POA: Diagnosis present

## 2023-05-28 DIAGNOSIS — E662 Morbid (severe) obesity with alveolar hypoventilation: Secondary | ICD-10-CM | POA: Diagnosis present

## 2023-05-28 DIAGNOSIS — J9601 Acute respiratory failure with hypoxia: Secondary | ICD-10-CM | POA: Diagnosis not present

## 2023-05-28 DIAGNOSIS — I5032 Chronic diastolic (congestive) heart failure: Secondary | ICD-10-CM | POA: Diagnosis not present

## 2023-05-28 DIAGNOSIS — S82891D Other fracture of right lower leg, subsequent encounter for closed fracture with routine healing: Secondary | ICD-10-CM

## 2023-05-28 DIAGNOSIS — J449 Chronic obstructive pulmonary disease, unspecified: Secondary | ICD-10-CM | POA: Diagnosis present

## 2023-05-28 DIAGNOSIS — I5030 Unspecified diastolic (congestive) heart failure: Secondary | ICD-10-CM | POA: Diagnosis present

## 2023-05-28 DIAGNOSIS — E538 Deficiency of other specified B group vitamins: Secondary | ICD-10-CM | POA: Insufficient documentation

## 2023-05-28 DIAGNOSIS — F10920 Alcohol use, unspecified with intoxication, uncomplicated: Secondary | ICD-10-CM

## 2023-05-28 DIAGNOSIS — F1123 Opioid dependence with withdrawal: Secondary | ICD-10-CM | POA: Diagnosis present

## 2023-05-28 DIAGNOSIS — R4182 Altered mental status, unspecified: Secondary | ICD-10-CM | POA: Diagnosis not present

## 2023-05-28 DIAGNOSIS — R0602 Shortness of breath: Secondary | ICD-10-CM | POA: Diagnosis not present

## 2023-05-28 DIAGNOSIS — Z6841 Body Mass Index (BMI) 40.0 and over, adult: Secondary | ICD-10-CM

## 2023-05-28 DIAGNOSIS — F10930 Alcohol use, unspecified with withdrawal, uncomplicated: Secondary | ICD-10-CM | POA: Diagnosis not present

## 2023-05-28 DIAGNOSIS — R400 Somnolence: Secondary | ICD-10-CM | POA: Diagnosis not present

## 2023-05-28 DIAGNOSIS — T402X1A Poisoning by other opioids, accidental (unintentional), initial encounter: Principal | ICD-10-CM | POA: Diagnosis present

## 2023-05-28 DIAGNOSIS — F17209 Nicotine dependence, unspecified, with unspecified nicotine-induced disorders: Secondary | ICD-10-CM

## 2023-05-28 DIAGNOSIS — E559 Vitamin D deficiency, unspecified: Secondary | ICD-10-CM | POA: Diagnosis present

## 2023-05-28 DIAGNOSIS — F10239 Alcohol dependence with withdrawal, unspecified: Secondary | ICD-10-CM | POA: Diagnosis present

## 2023-05-28 DIAGNOSIS — Y906 Blood alcohol level of 120-199 mg/100 ml: Secondary | ICD-10-CM | POA: Diagnosis not present

## 2023-05-28 DIAGNOSIS — X58XXXD Exposure to other specified factors, subsequent encounter: Secondary | ICD-10-CM | POA: Diagnosis present

## 2023-05-28 DIAGNOSIS — R45850 Homicidal ideations: Secondary | ICD-10-CM | POA: Diagnosis present

## 2023-05-28 DIAGNOSIS — G894 Chronic pain syndrome: Secondary | ICD-10-CM

## 2023-05-28 LAB — CBC, PLATELET & DIFFERENTIAL
ABSOLUTE BASOPHIL COUNT MANUAL: 0.1 10*3/uL (ref 0.0–0.1)
ABSOLUTE EO COUNT MANUAL: 0 10*3/uL (ref 0.0–0.8)
ABSOLUTE LYMPH COUNT MANUAL: 4.4 10*3/uL (ref 0.6–5.9)
ABSOLUTE METAMYELOCYTE CT MAN: 0.1 10*3/uL — ABNORMAL HIGH (ref 0.0–0.0)
ABSOLUTE MONOCYTE COUNT MANUAL: 0.5 10*3/uL (ref 0.2–1.4)
ABSOLUTE NEUT COUNT MANUAL: 8 THuL (ref 1.6–8.3)
ABSOLUTE NRBC COUNT: 0 10*3/uL (ref 0.0–0.0)
ATYPICAL LYMPHOCYTES %: 8 % (ref 0.0–12.0)
BASOPHILS %: 1 % (ref 0.0–1.2)
EOSINOPHILS %: 0 % (ref 0.0–7.0)
HEMATOCRIT: 45.2 % — ABNORMAL HIGH (ref 34.1–44.9)
HEMOGLOBIN: 15.5 g/dL (ref 11.2–15.7)
LYMPHOCYTES %: 25 % (ref 15.0–54.0)
MEAN CORP HGB CONC: 34.3 g/dL (ref 31.0–37.0)
MEAN CORPUSCULAR HGB: 31.3 pg (ref 26.0–34.0)
MEAN CORPUSCULAR VOL: 91.3 fl (ref 80.0–100.0)
MEAN PLATELET VOLUME: 11.8 fL (ref 8.7–12.5)
METAMYELOCYTES %: 1 % — ABNORMAL HIGH (ref 0–0.0)
MONOCYTES %: 4 % (ref 4.0–13.0)
NRBC %: 0 % (ref 0.0–0.0)
NUCLEATED RED BLOOD CELLS: 0 /100 WC (ref 0.0–0.0)
PLATELET COUNT: 340 10*3/uL (ref 150–400)
PLATELET ESTIMATE: NORMAL
POLYMORPHONUCLEAR (SEGS) %: 61 % (ref 40.0–75.0)
RBC DISTRIBUTION WIDTH STD DEV: 43.8 fL (ref 35.1–46.3)
RED BLOOD CELL COUNT: 4.95 M/uL (ref 3.90–5.20)
WHITE BLOOD CELL COUNT: 13.2 10*3/uL — ABNORMAL HIGH (ref 4.0–11.0)

## 2023-05-28 LAB — HEPATIC FUNCTION PANEL
ALANINE AMINOTRANSFERASE: 67 U/L — ABNORMAL HIGH (ref 12–45)
ALBUMIN: 4.2 g/dL (ref 3.4–5.2)
ALKALINE PHOSPHATASE: 182 U/L — ABNORMAL HIGH (ref 45–117)
ASPARTATE AMINOTRANSFERASE: 38 U/L — ABNORMAL HIGH (ref 8–34)
BILIRUBIN DIRECT: <0.2 mg/dL (ref 0.0–0.2)
BILIRUBIN TOTAL: 0.3 mg/dL (ref 0.2–1.0)
TOTAL PROTEIN: 7.2 g/dL (ref 6.4–8.2)

## 2023-05-28 LAB — URINE DRUG SCREEN
AMPHETAMINES URINE: NEGATIVE ng/mL
BENZODIAZEPINES URINE: NEGATIVE ng/mL
BUPRENORPHINE SCREEN URINE: NEGATIVE ng/mL
CANNABINOIDS URINE: NEGATIVE ng/mL
COCAINE METABOLITES URINE: POSITIVE ng/mL — AB
ETHANOL URINE: POSITIVE mg/dL — AB
FENTANYL URINE: NEGATIVE ng/mL
METHADONE URINE: NEGATIVE ng/mL
OPIATES URINE: NEGATIVE ng/mL
OXYCOD SCRN URINE: POSITIVE ng/mL — AB
SPEC VALIDITY SPECIFIC GRAVITY: 1.01 (ref 1.003–1.035)
SPECIMEN VALIDITY URINE CREAT: 58 mg/dL (ref >20)
SPECIMEN VALIDITY URINE PH: 6 (ref 5.0–8.5)

## 2023-05-28 LAB — BASIC METABOLIC PANEL
ANION GAP: 18 mmol/L (ref 10–22)
BUN (UREA NITROGEN): 4 mg/dL — ABNORMAL LOW (ref 7–18)
CALCIUM: 9.2 mg/dL (ref 8.5–10.5)
CARBON DIOXIDE: 18 mmol/L — ABNORMAL LOW (ref 21–32)
CHLORIDE: 99 mmol/L (ref 98–107)
CREATININE: 0.7 mg/dL (ref 0.4–1.2)
ESTIMATED GLOMERULAR FILT RATE: >60 mL/min (ref >60)
Glucose Random: 102 mg/dL (ref 74–160)
POTASSIUM: 4.1 mmol/L (ref 3.5–5.1)
SODIUM: 135 mmol/L — ABNORMAL LOW (ref 136–145)

## 2023-05-28 LAB — TROPONIN T HS BASELINE: TROPONIN T HS BASELINE: 8 ng/L (ref 0–10)

## 2023-05-28 LAB — SERUM DRUG SCREEN
ACETAMINOPHEN: <5 ug/mL (ref 10–30)
ETHANOL: 187 mg/dL — ABNORMAL HIGH (ref 0–10)
SALICYLATE: <0.5 mg/dL (ref 3.0–20.0)

## 2023-05-28 LAB — NT-PROBNP: NT-proBNP: 74 pg/mL (ref 0–125)

## 2023-05-28 LAB — ADD ON CAN'T BE DONE CHEMISTRY

## 2023-05-28 MED ORDER — PRAZOSIN HCL 1 MG PO CAPS
2.0000 mg | ORAL_CAPSULE | Freq: Every evening | ORAL | Status: DC
Start: 2023-05-28 — End: 2023-05-29
  Administered 2023-05-28: 2 mg via ORAL
  Filled 2023-05-28: qty 2

## 2023-05-28 MED ORDER — DULOXETINE HCL 30 MG PO CPEP
30.0000 mg | ORAL_CAPSULE | Freq: Every day | ORAL | Status: DC
Start: 2023-05-28 — End: 2023-05-30
  Administered 2023-05-28 – 2023-05-30 (×3): 30 mg via ORAL
  Filled 2023-05-28 (×3): qty 1

## 2023-05-28 MED ORDER — DIAZEPAM 5 MG PO TABS
5.0000 mg | ORAL_TABLET | ORAL | Status: DC | PRN
Start: 2023-05-28 — End: 2023-05-28

## 2023-05-28 MED ORDER — LORAZEPAM 2 MG/ML IJ SOLN
4.0000 mg | INTRAMUSCULAR | Status: DC | PRN
Start: 2023-05-28 — End: 2023-05-29

## 2023-05-28 MED ORDER — PANTOPRAZOLE SODIUM 40 MG PO TBEC
40.0000 mg | DELAYED_RELEASE_TABLET | Freq: Every day | ORAL | Status: DC
Start: 2023-05-28 — End: 2023-06-02
  Administered 2023-05-28 – 2023-06-02 (×6): 40 mg via ORAL
  Filled 2023-05-28 (×6): qty 1

## 2023-05-28 MED ORDER — NICOTINE 14 MG/24HR TD PT24
1.0000 | MEDICATED_PATCH | Freq: Once | TRANSDERMAL | Status: AC
  Filled 2023-05-28: qty 1

## 2023-05-28 MED ORDER — HEPARIN SODIUM (PORCINE) 5000 UNIT/ML IJ SOLN
5000.0000 [IU] | Freq: Two times a day (BID) | INTRAMUSCULAR | Status: DC
Start: 2023-05-28 — End: 2023-05-30
  Administered 2023-05-28 – 2023-05-30 (×4): 5000 [IU] via SUBCUTANEOUS
  Filled 2023-05-28 (×4): qty 1

## 2023-05-28 MED ORDER — LORAZEPAM 2 MG/ML IJ SOLN
1.0000 mg | INTRAMUSCULAR | Status: DC | PRN
Start: 2023-05-28 — End: 2023-05-29

## 2023-05-28 MED ORDER — BUMETANIDE 1 MG PO TABS
4.0000 mg | ORAL_TABLET | Freq: Every day | ORAL | Status: DC
Start: 2023-05-28 — End: 2023-06-02
  Administered 2023-05-28 – 2023-06-02 (×6): 4 mg via ORAL
  Filled 2023-05-28 (×7): qty 4

## 2023-05-28 MED ORDER — ACETAMINOPHEN 500 MG PO TABS
1000.0000 mg | ORAL_TABLET | Freq: Three times a day (TID) | ORAL | Status: DC
Start: 2023-05-28 — End: 2023-06-02
  Administered 2023-05-28 – 2023-06-02 (×13): 1000 mg via ORAL
  Filled 2023-05-28 (×13): qty 2

## 2023-05-28 MED ORDER — NICOTINE POLACRILEX 2 MG MT GUM
4.0000 mg | CHEWING_GUM | OROMUCOSAL | Status: DC | PRN
Start: 2023-05-28 — End: 2023-06-02
  Administered 2023-05-28 – 2023-06-02 (×14): 4 mg via ORAL
  Filled 2023-05-28 (×14): qty 2

## 2023-05-28 MED ORDER — BUPRENORPHINE HCL-NALOXONE HCL 8-2 MG SL SUBL
8.0000 mg | SUBLINGUAL_TABLET | Freq: Three times a day (TID) | SUBLINGUAL | Status: DC
Start: 2023-05-28 — End: 2023-05-29
  Administered 2023-05-28 – 2023-05-29 (×2): 1 via SUBLINGUAL
  Filled 2023-05-28 (×2): qty 1

## 2023-05-28 MED ORDER — DIAZEPAM 10 MG PO TABS
20.0000 mg | ORAL_TABLET | Freq: Once | ORAL | Status: AC | PRN
Start: 2023-05-28 — End: 2023-05-28
  Administered 2023-05-28: 20 mg via ORAL
  Filled 2023-05-28: qty 2

## 2023-05-28 MED ORDER — OXCARBAZEPINE 150 MG PO TABS
150.0000 mg | ORAL_TABLET | Freq: Two times a day (BID) | ORAL | Status: DC
Start: 2023-05-28 — End: 2023-06-02
  Administered 2023-05-28 – 2023-06-02 (×9): 150 mg via ORAL
  Filled 2023-05-28 (×9): qty 1

## 2023-05-28 MED ORDER — MIRTAZAPINE 7.5 MG PO TABS
7.5000 mg | ORAL_TABLET | Freq: Every evening | ORAL | Status: DC
Start: 2023-05-28 — End: 2023-05-29
  Administered 2023-05-28: 7.5 mg via ORAL
  Filled 2023-05-28: qty 1

## 2023-05-28 MED ORDER — DIAZEPAM 5 MG/ML IJ SOLN
10.0000 mg | INTRAMUSCULAR | Status: DC | PRN
Start: 2023-05-28 — End: 2023-05-28

## 2023-05-28 MED ORDER — LORAZEPAM 1 MG PO TABS
4.0000 mg | ORAL_TABLET | ORAL | Status: DC | PRN
Start: 2023-05-28 — End: 2023-05-29
  Administered 2023-05-28: 4 mg via SUBLINGUAL
  Filled 2023-05-28: qty 4

## 2023-05-28 MED ORDER — DIAZEPAM 5 MG/ML IJ SOLN
20.0000 mg | Freq: Once | INTRAMUSCULAR | Status: AC | PRN
Start: 2023-05-28 — End: 2023-05-28

## 2023-05-28 MED ORDER — LORAZEPAM 1 MG PO TABS
2.0000 mg | ORAL_TABLET | ORAL | Status: DC | PRN
Start: 2023-05-28 — End: 2023-05-29

## 2023-05-28 MED ORDER — ONDANSETRON 4 MG PO TBDP
ORAL_TABLET | ORAL | Status: AC
Start: 2023-05-28 — End: 2023-05-28
  Administered 2023-05-28: 4 mg via ORAL
  Filled 2023-05-28: qty 1

## 2023-05-28 MED ORDER — MELATONIN 3 MG PO TABS
3.0000 mg | ORAL_TABLET | Freq: Every evening | ORAL | Status: DC
Start: 2023-05-28 — End: 2023-05-29
  Administered 2023-05-28: 3 mg via ORAL
  Filled 2023-05-28: qty 1

## 2023-05-28 MED ORDER — DIAZEPAM 10 MG PO TABS
10.0000 mg | ORAL_TABLET | ORAL | Status: DC | PRN
Start: 2023-05-28 — End: 2023-05-28

## 2023-05-28 MED ORDER — ONDANSETRON 4 MG PO TBDP
4.00 mg | ORAL_TABLET | Freq: Once | ORAL | Status: AC
Start: 2023-05-28 — End: 2023-05-28
  Administered 2023-05-28: 4 mg via ORAL

## 2023-05-28 MED ORDER — LORAZEPAM 2 MG/ML IJ SOLN
2.0000 mg | INTRAMUSCULAR | Status: DC | PRN
Start: 2023-05-28 — End: 2023-05-29
  Administered 2023-05-28 – 2023-05-29 (×2): 2 mg via INTRAVENOUS
  Filled 2023-05-28 (×2): qty 1

## 2023-05-28 MED ORDER — LEVETIRACETAM 500 MG PO TABS
500.0000 mg | ORAL_TABLET | Freq: Two times a day (BID) | ORAL | Status: DC
Start: 2023-05-28 — End: 2023-05-28

## 2023-05-28 MED ORDER — SPIRONOLACTONE 100 MG PO TABS
100.0000 mg | ORAL_TABLET | Freq: Every day | ORAL | Status: DC
Start: 2023-05-28 — End: 2023-06-02
  Administered 2023-05-28 – 2023-06-02 (×6): 100 mg via ORAL
  Filled 2023-05-28 (×6): qty 1

## 2023-05-28 MED ORDER — GABAPENTIN 300 MG PO CAPS
600.0000 mg | ORAL_CAPSULE | Freq: Three times a day (TID) | ORAL | Status: DC
Start: 2023-05-28 — End: 2023-05-29
  Administered 2023-05-28 – 2023-05-29 (×2): 600 mg via ORAL
  Filled 2023-05-28 (×2): qty 2

## 2023-05-28 MED ORDER — BUPRENORPHINE HCL-NALOXONE HCL 8-2 MG SL SUBL
8.00 mg | SUBLINGUAL_TABLET | Freq: Once | SUBLINGUAL | Status: AC
Start: 2023-05-28 — End: 2023-05-28
  Administered 2023-05-28: 1 via SUBLINGUAL
  Filled 2023-05-28: qty 1

## 2023-05-28 MED ORDER — BACLOFEN 10 MG PO TABS
10.0000 mg | ORAL_TABLET | Freq: Two times a day (BID) | ORAL | Status: DC
Start: 2023-05-28 — End: 2023-05-29
  Administered 2023-05-28 – 2023-05-29 (×2): 10 mg via ORAL
  Filled 2023-05-28 (×2): qty 1

## 2023-05-28 MED ORDER — ATORVASTATIN CALCIUM 80 MG PO TABS
80.0000 mg | ORAL_TABLET | Freq: Every evening | ORAL | Status: DC
Start: 2023-05-28 — End: 2023-06-02
  Administered 2023-05-28 – 2023-06-01 (×4): 80 mg via ORAL
  Filled 2023-05-28 (×4): qty 1

## 2023-05-28 MED ORDER — LEVETIRACETAM 500 MG PO TABS
750.0000 mg | ORAL_TABLET | Freq: Two times a day (BID) | ORAL | Status: DC
Start: 2023-05-28 — End: 2023-06-02
  Administered 2023-05-28 – 2023-06-02 (×9): 750 mg via ORAL
  Filled 2023-05-28 (×9): qty 1

## 2023-05-28 MED ORDER — LORAZEPAM 1 MG PO TABS
1.0000 mg | ORAL_TABLET | ORAL | Status: DC | PRN
Start: 2023-05-28 — End: 2023-05-29

## 2023-05-28 MED ORDER — LEVOTHYROXINE SODIUM 25 MCG PO TABS
25.0000 ug | ORAL_TABLET | Freq: Every morning | ORAL | Status: DC
Start: 2023-05-29 — End: 2023-06-02
  Administered 2023-05-29 – 2023-06-02 (×5): 25 ug via ORAL
  Filled 2023-05-28 (×5): qty 1

## 2023-05-28 MED ORDER — IBUPROFEN 400 MG PO TABS
400.0000 mg | ORAL_TABLET | Freq: Four times a day (QID) | ORAL | Status: DC | PRN
Start: 2023-05-28 — End: 2023-06-02
  Administered 2023-05-30 (×2): 400 mg via ORAL
  Filled 2023-05-28 (×2): qty 1

## 2023-05-28 MED ORDER — BUDESONIDE-FORMOTEROL FUMARATE 160-4.5 MCG/ACT IN AERO
2.0000 | INHALATION_SPRAY | Freq: Two times a day (BID) | RESPIRATORY_TRACT | Status: DC
Start: 2023-05-28 — End: 2023-06-02
  Administered 2023-05-28 – 2023-06-01 (×7): 2 via RESPIRATORY_TRACT
  Filled 2023-05-28: qty 6

## 2023-05-28 MED ORDER — NICOTINE 21 MG/24HR TD PT24
MEDICATED_PATCH | TRANSDERMAL | Status: AC
Start: 2023-05-28 — End: 2023-05-28
  Filled 2023-05-28: qty 1

## 2023-05-28 NOTE — ED Triage Note (Signed)
Arrived via [facility] + ETOH and + HI.  Denies SI.  Pt has been self medicating with ETOH and cocaine because she is trying to wean herself of her prescribed Oxycodone.  Pt is post OP right ankle Aug 8 th.  Pt tearful and wants a psych eval.  + HI toward " people who want to hurt me".  1/2 pint drank this am and last cocaine use 3 days ago.

## 2023-05-28 NOTE — ED Provider Notes (Signed)
I have reviewed the ED nursing notes and prior records. I have reviewed the patient's past medical history/problem list, allergies, social history and medication list.  I saw this patient primarily.    HPI:  52 year old female with a history of HFpEF, chronic pain, prior suicidal behavior, seizure disorder, AF, borderline personality disorder, PTSD, MDD, obesity hypoventilation syndrome, now presenting with homicidal ideation.  Patient states that she had surgery on her right ankle in August, and was prescribed high doses of oxycodone for this pain.  States that she has been trying to self wean her oxycodone since the surgery, however feels as though she may be withdrawing.  Self weaning her oxycodone has caused her to relapse on alcohol and cocaine, which she states she has been previously sober for the past 7 years from.  She is also prescribed daily Suboxone for chronic pain, and reports that over the past few days that she has been drinking and using cocaine, she has missed several of her prescribed medications.  Reports pain "all over."  Says that since she has been discontinuing her opiates, she has developed thoughts of harming others.  States "it is gotten so bad that I have detailed plans for how I will hurt people" but declined to tell me what those plans were.  States she is seeking help and wants to see a psychiatrist, but also is concerned about her opiate addiction.  Last took oxycodone about 3 days ago.     Triage Documentation       Cordelia Poche, RN 05/28/2023 10:59                 Arrived via [facility] + ETOH and + HI.  Denies SI.  Pt has been self medicating with ETOH and cocaine because she is trying to wean herself of her prescribed Oxycodone.  Pt is post OP right ankle Aug 8 th.  Pt tearful and wants a psych eval.  + HI toward " people who want to hurt me".  1/2 pint drank this am and last cocaine use 3 days ago.               Past Medical History/Problem list:  Past Medical History:  schizo:  Bipolar affective ([name])  No date: Chronic systolic CHF (congestive heart failure) ([name])  No date: Depression  12/23/2021: Fall in home  12/23/2021: Gait instability  12/23/2021: Requires continuous at home supplemental oxygen  depress: Schizo affective schizophrenia ([name])  12/23/2021: Spinal stenosis of lumbar 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   claudication  No date: Substance abuse [facility])  Patient Active Problem List:     Dyspnea on minimal exertion     Suicidal behavior with attempted self-injury ([name])     Tobacco use disorder     Systolic congestive heart failure ([name])     Seizure disorder ([name])     Depressive disorder     Suicidal ideation     Afib ([name])     Alcohol use disorder     Borderline personality disorder ([name])     Chronic pain disorder     Morbid obesity ([name])     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)     PTSD (post-traumatic stress disorder)     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     (HFpEF) heart failure with preserved ejection fraction ([name])     MDD (major depressive disorder), recurrent, severe, with psychosis ([name])     Requires continuous at home supplemental oxygen  Spinal stenosis of lumbar 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 claudication     Fall in home     Gait instability     Hypokalemia     Lung nodule     Stable angina     Chest pain, atypical     Obesity hypoventilation syndrome ([name])     Osteoarthritis of spine with radiculopathy, lumbar region     Chronic bilateral low back pain with bilateral sciatica     Hypoxia     Alcohol withdrawal syndrome, uncomplicated ([name])    Past Surgical History:   History reviewed. No pertinent surgical history.  Social History:   Social History     Socioeconomic History    Marital status: Divorced     Spouse name: Not on file    Number of children: Not on file    Years of education: Not on file    Highest education level: Not on file   Occupational History    Not on file   Tobacco Use    Smoking status: Every Day     Current packs/day: 3.00     Average packs/day: 3.0  packs/day for 30.0 years (90.0 ttl pk-yrs)     Types: Cigarettes     Passive exposure: Current    Smokeless tobacco: Never    Tobacco comments:     Pt currently on nicotine patch and gum   Vaping Use    Vaping status: Not on file   Substance and Sexual Activity    Alcohol use: Yes     Comment: 2 liters of captain morgan daily    Drug use: Yes     Types: Marijuana, Cocaine    Sexual activity: Yes     Partners: Male   Other Topics Concern    Not on file   Social History Narrative    Not on file   Social Determinants of Health  Financial Resource Strain: Not on File (05/30/2019)      Received from [employer], [employer]          Financial Resource Strain: 0  Food Insecurity: Not on File (05/30/2019)      Received from [place], [employer] Insecurity          Food: 0  Transportation Needs: Not on File (05/30/2019)      Received from [employer], [employer] Needs          Transportation: 0  Physical Activity: Not on File (05/30/2019)      Received from [place], [place]      Physical Activity          Physical Activity: 0  Stress: Not on File (05/30/2019)      Received from [facility], [place]      Stress          Stress: 0  Social Connections: Not on File (05/30/2019)      Received from [place], [place]      Social Connections          Social Connections and Isolation: 0  Intimate Partner Violence: Not on file  Housing Stability: Not on File (05/30/2019)      Received from [place], [employer]      Housing Stability          Housing: 0  Allergies:  Review of Patient's Allergies indicates:   Bee venom  Anaphylaxis   Metolazone              Other (See Comments)    Comment:Hypokalemia   Trazodone               Hives   Methylprednisolone      Dizziness, Drowsiness    Comment:Also believes syncope. Seems to tolerate             inhalational and epidural steroid.   Nutritional supplem*       Tegretol [carbamaze*    Hives   Hydroxyzine             Nausea Only      Physical Exam:  ED Triage Vitals [05/28/23 1054]    ED Triage Vitals Brief Group      Temp 97.7 F      Pulse 104      Resp 18      BP 122/80      SpO2 96 %      Pain Score      GENERAL: Chronically ill appearing, distressed.   SKIN:  Warm & Dry  HEAD:  Atraumatic.   NECK:  Supple.   LUNGS:  Normal respiratory effort. Lungs clear to auscultation with no rales, rhonchi, or wheeze  CV: Regular rate and rhythm. Warm and well perfused  ABDOMEN:  Soft. No guarding.  MUSCULOSKELETAL:  No deformities. RLE in short leg cast  NEUROLOGIC:  Alert. Normal speech.  Moving all four extremities.  PSYCHIATRIC:  Intoxicated, upset. +HI. No SI or AVH.          Lab Results:     Labs Reviewed   CBC, PLATELET & DIFFERENTIAL - Abnormal; Notable for the following components:       Result Value    WHITE BLOOD CELL COUNT 13.2 (*)     HEMATOCRIT 45.2 (*)     METAMYELOCYTES % 1.0 (*)     ABSOLUTE METAMYELOCYTE CT MAN 0.1 (*)     TEARDROPS SLIGHT (*)     All other components within normal limits   BASIC METABOLIC PANEL - Abnormal; Notable for the following components:    SODIUM 135 (*)     CARBON DIOXIDE 18 (*)     BUN (UREA NITROGEN) 4 (*)     All other components within normal limits    Narrative:     Tests added: HFT by  on 05/28/23 at 1529 by [email or web address].The  following add-on test request for TROP cannot be done  because NO GREEN TOP REC.05/28/23 1203 NS14   Tests added: PROBNP by BB on 05/28/23 at 1140 by NS14.   SERUM DRUG SCREEN - Abnormal; Notable for the following components:    ETHANOL 187 (*)     All other components within normal limits    Narrative:     Tests added: HFT by  on 05/28/23 at 1529 by [email or web address].The  following add-on test request for TROP cannot be done  because NO GREEN TOP REC.05/28/23 1203 NS14   Tests added: PROBNP by BB on 05/28/23 at 1140 by NS14.   URINE DRUG SCREEN - Abnormal; Notable for the following components:    COCAINE METABOLITES URINE POSITIVE (*)     ETHANOL URINE POSITIVE (*)     OXYCOD SCRN URINE POSITIVE (*)     All other components within normal limits     Narrative:     URINE&Urine   ADD ON CAN'T BE DONE CHEMISTRY - Abnormal; Notable for the following components:  ADD ON CAN'T BE DONE CHEMISTRY SEE BELOW (*)     All other components within normal limits    Narrative:     Tests added: HFT by  on 05/28/23 at 1529 by [email or web address].The  following add-on test request for TROP cannot be done  because NO GREEN TOP REC.05/28/23 1203 NS14   Tests added: PROBNP by BB on 05/28/23 at 1140 by NS14.   HEPATIC FUNCTION PANEL - Abnormal; Notable for the following components:    ALKALINE PHOSPHATASE 182 (*)     ASPARTATE AMINOTRANSFERASE 38 (*)     ALANINE AMINOTRANSFERASE 67 (*)     All other components within normal limits    Narrative:     Tests added: HFT by  on 05/28/23 at 1529 by [email or web address].The  following add-on test request for TROP cannot be done  because NO GREEN TOP REC.05/28/23 1203 NS14   Tests added: PROBNP by BB on 05/28/23 at 1140 by NS14.   LAB ADD ON/WRITE IN TEST   NT-PROBNP    Narrative:     Tests added: HFT by  on 05/28/23 at 1529 by [email or web address].The  following add-on test request for TROP cannot be done  because NO GREEN TOP REC.05/28/23 1203 NS14   Tests added: PROBNP by BB on 05/28/23 at 1140 by NS14.   TROPONIN T HS BASELINE   LAB ADD ON/WRITE IN TEST                 ED Course and Medical Decision-making:    Assessment and Plan:  52 year old female with a history of HFpEF, chronic pain, prior suicidal behavior, seizure disorder, AF, borderline personality disorder, PTSD, MDD, obesity hypoventilation syndrome, now presenting with homicidal ideation.  Patient reports plan to hurt several others (although no healthcare workers) with plan, however will not disclose her plan for doing so.  She additionally reports feelings of opiate withdrawal and has not taken her oxycodone in ~3 days, and additionally has missed her prescribed suboxone.  Will plan to give patient 8 mg Suboxone and attempt to treat both her chronic pain and potential oxycodone withdrawal, although patient is not  exhibiting diaphoresis, diarrhea, vomiting, or any withdrawal symptoms on my initial exam, although this is partially clouded by patient's concomitant alcohol intoxication and possible cocaine intoxication.    Patient also discloses that she has not taken her home medications as prescribed in several days, also in addition to basic screening labs, will additionally obtain LFTs, EKG, troponin, and BNP to evaluate for decompensated heart failure, evidence of cardiac ischemia, or marked LFT elevation (pt has a hx of transaminitis on prior checks).     EKG interpreted independently by me, reveals a NSR with no STE, STD, or TWI. Unchanged from priors. Qtc 487    Labs reviewed independently by me, notable for:  -Trop and BNP wnl  - CBC with leukocytosis to 13k, however no other evidence of infection, appears hemoconcentrated  - BMP with mild hypoNa (135), Cr 0.7  - Serum tox pos for ethanol (187)  - LFTs mildly elevated, similar to priors (AST 38, ALT 67)  - Utox pos for cocaine, ethanol, and oxycodone    At this time, I medically cleared patient for [name] evaluation given her homicidal ideation.    Unfortunately, while in the ED, patient began to exhibit signs of alcohol withdrawal, scoring a CIWA of 18, for which she received 20 mg p.o. Valium.  I additionally gave patient 4 mg of ODT Zofran for some  nausea.    I spoke with [name], who requested that patient be admitted for alcohol withdrawal, given history of complicated alcohol withdrawals in the past, with inpatient psychiatry consulting on patient.     Pt maintained on sitter, and ultimately signed out to admitting hospitalist with plan for inpatient psych consult and continued monitoring for withdrawal. At time of sign out, pt resting comfortably after Valium, Suboxone, and Zofran.    - External records were reviewed: [facility] records - s/p ankle surgery in Aug 2024. Reiewed PCP notes 01/2023, hx HFpEF with dry wt 155kg, no home O2 except at night for OHS    - Case also  discussed with consultants: hospitalist, [name]  - Social determinants of health: substance use    While in the ED patient received:   Medications   diazepam (VALIUM) tablet 5 mg (has no administration in time range)   diazepam (VALIUM) tablet 10 mg (has no administration in time range)     Or   diazePAM (VALIUM) injection 10 mg (has no administration in time range)   nicotine (NICODERM CQ) 14 MG/24HR 1 patch (1 patch Transdermal Not Given 05/28/23 1653)   diazePAM (VALIUM) injection 20 mg ( Intravenous See Alternative 05/28/23 1515)     Or   diazepam (VALIUM) tablet 20 mg (20 mg Oral Given 05/28/23 1515)   ondansetron (ZOFRAN-ODT) disintegrating tablet 4 mg (4 mg Oral Given 05/28/23 1533)   buprenorphine-naloxone (SUBOXONE) 8-2 MG SL tablet 1 tablet (1 tablet Sublingual Given 05/28/23 1537)   nicotine (NICODERM CQ) 21 MG/24HR (  Patch Applied 05/28/23 1649)       Disposition: Admit To [facility]    Patient Condition: Stable     Initial Impression:  Alcoholic intoxication without complication ([name])  Cocaine use  Alcohol withdrawal syndrome without complication ([name])  Homicidal ideation    Octaviano Glow, MD  Attending Physician  [facility]    [Portions of this chart may have been created with Conservation officer, historic buildings. Occasional wrong-word or "sound-alike" substitutions may have occurred due to the inherent limitations of voice recognition software. Please read the chart carefully and recognize, using context, where these substitutions have occurred.]

## 2023-05-28 NOTE — Narrator Note (Signed)
Patient Disposition    Cleared to admit to [facility] [place].  IV placed in left hand ( 24 g).  Nicoderm patch placed on left arm.  Patient education for diagnosis, medications, activity, diet and follow-up.  Patient left ED 4:50 PM.  Patient rep received written instructions.    Interpreter to provide instructions: No    Patient belongings with patient: YES    Have all existing LDAs been addressed? Yes    Have all IV infusions been stopped? N/A    Destination: Admit to:  Medical/surgical unit with transport

## 2023-05-28 NOTE — ED Notes (Signed)
Provided blanket    Pt is in hospital gown and pants  Labs drawn and sent  Belongings are in belonging bag

## 2023-05-28 NOTE — Event Note (Incomplete)
PDMP: Cheryl Zimmerman 23F  Contact the [facility] Knowledge/Help Center  Date of Birth:  Oct 24, 1970  Recent Address:  [address] [place], [place] [address]  Status of States Queried:  [employer] Records (10)  Other Tools/Metrics  One or more patients do not exactly match the search patient demographic information that was sent during search: Elizabe, [place], 01/22/1971.  The report displayed is for the next best possible match. Please verify the report is for the intended patient before proceeding.      Report generated on 05/28/2023. Report Date Range: 05/27/2021 - 05/28/2023  RX Summary  Summary  Total Prescriptions 76   Total Private Pay 15   Total Prescribers 20   Total Pharmacies 7   Narcotics (excluding Buprenorphine)  Current MME/day 0.00   30 Day Avg MME/day 62.83   Current Qty 0   Buprenorphine  Current mg/day 0.00   30 Day Avg mg/day 0.00   Current Qty 0     State Indicators (0)  Details  Column Settings  Prescriptions  Total: 76  Private Pay: 15  Showing 1-15 of 76 Items View 15 Items   of 6   Filled  Written  ID  Drug  QTY  Days  Prescriber  RX #  Dispenser  Refill  Daily Dose*  Pymt Type  PMP    05/18/2023 05/18/2023 8 Tramadol Hcl 50 Mg Tablet 28 7 Hu Ngu [id number] [facility] (2760) 0/0 40.00 MME Medicare [place]   05/15/2023 04/19/2023 10 Gabapentin 600 Mg Tablet 90 30 Ch Nda [id number] [facility] (2760) 0/1 2.01 LME Medicare [place]   05/07/2023 05/05/2023 9 Oxycodone Hcl (Ir) 5 Mg Tablet 60 5 [place] Ach [id number] [facility] (6027) 0/0 90.00 MME Private Pay [place]   05/05/2023 05/05/2023 9 Oxycontin Er 20 Mg Tablet 28 14 [place] Ach [id number] [facility] (6027) 0/0 60.00 MME Private Pay [place]   05/01/2023 05/01/2023 9 Oxycontin Er 10 Mg Tablet 12 3 Na Nar [id number] [facility] (6027) 0/0 60.00 MME Private Pay [place]   05/01/2023 05/01/2023 9 Gabapentin 600 Mg Tablet 42 14 In Alm [id number] [facility] (6027) 0/0 2.01 LME Private Pay [place]   05/01/2023 05/01/2023 9 Oxycodone Hcl (Ir) 5 Mg Tablet 18 1 Na Nar [id number] [facility] (6027) 0/0 135.00 MME Private Pay [place]   05/01/2023  05/01/2023 9 Levetiracetam 750 Mg Tablet 28 14 In Alm [id number] [facility] (6027) 0/0  Private Pay [place]   05/01/2023 05/01/2023 9 Oxcarbazepine 150 Mg Tablet 28 14 In Alm [id number] [facility] (6027) 0/0  Private Pay [place]   04/17/2023 01/13/2023 8 Gabapentin 600 Mg Tablet 90 30 Ch Nda [id number] [facility] (2760) 1/1 2.01 LME Medicare [place]   03/07/2023 01/13/2023 8 Gabapentin 600 Mg Tablet 90 30 Ch Nda [id number] [facility] (2760) 0/1 2.01 LME Medicare [place]   02/04/2023 11/29/2022 8 Gabapentin 600 Mg Tablet 90 30 Ch Nda [id number] [facility] (2760) 1/1 2.01 LME Medicare [place]   02/01/2023 02/01/2023 8 Buprenorphine-Nalox 8-2mg  Film 90 30 Ca Hos [id number] [facility] (2760) 0/0 24.00 mg Medicare [place]   01/19/2023 01/19/2023 8 Buprenorphine-Nalox 8-2mg  Film 21 7 Ca Hos [id number] [facility] (2760) 0/0 24.00 mg Medicare [place]   12/31/2022 12/15/2022 8 Buprenorphine-Nalox 8-2 Mg Tab 21 7 Ca Hos [id number] [facility] (2760) 0/0 24.00 mg Medicare [place]   Disclaimer  Showing 1-15 of 76 Items View 15 Items   of 6     RX Summary Expanded  Narcotics (excluding Buprenorphine)  Current MME/day 0.00   30 Day Avg MME/day 62.83   90 Day  Avg MME/day 20.94   Rx Count/12 Months 5   Prescriber #/6 Months 3   Pharmacy #/6 Months 2   Current Qty 0   Buprenorphine  Current mg/day 0.00   30 Day Avg mg/day 0.00   90 Day Avg mg/day 0.80   Rx Count/12 Months 20   Prescriber #/6 Months 1   Pharmacy #/6 Months 1   Current Qty 0   Sedatives  30 Day Avg LME/day 3.08   90 Day Avg LME/day 2.10   Rx Count/12 Months 11   Prescriber #/6 Months 3   Pharmacy #/6 Months 2   Current Qty 48   Stimulants  30 Day Avg mg/day 0.00   90 Day Avg mg/day 0.00   Rx Count/12 Months 0   Prescriber #/6 Months 0   Pharmacy #/6 Months 0   Current Qty 0     Management consultant Expanded  Narcotics (excluding Buprenorphine)  Current MME/day 0.00   30 Day Avg MME/day 62.83   90 Day Avg MME/day 20.94   Rx Count/12 Months 5   Prescriber #/6 Months 3   Pharmacy #/6 Months 2   Current Qty 0   Buprenorphine  Current mg/day 0.00   30 Day Avg mg/day 0.00   90 Day  Avg mg/day 0.80   Rx Count/12 Months 20   Prescriber #/6 Months 1   Pharmacy #/6 Months 1   Current Qty 0   Sedatives  30 Day Avg LME/day 3.08   90 Day Avg LME/day 2.10   Rx Count/12 Months 11   Prescriber #/6 Months 3   Pharmacy #/6 Months 2   Current Qty 48   Stimulants  30 Day Avg mg/day 0.00   90 Day Avg mg/day 0.00   Rx Count/12 Months 0   Prescriber #/6 Months 0   Pharmacy #/6 Months 0   Current Qty 0     Column Settings  Providers  Total: 20  Showing 1-15 of 20 Items View 15 Items   of 2   Name  Address  [facility]  Zipcode  Phone    [place] [address] [place] [address] -   Jeronimo Norma, MSN [address] [place] [address] -   Saundra Shelling [name] [place] [address] -   [place] [address] [place] [address] -   Calton Golds [address] [place] [address] -   Pollie Meyer [address] [place] [address] -   [employer] [address] [place] [address] -   Christiana Dennis-Fallah [address] [place] [address] -   Maia Petties, MD [address] [place] [address] -   [facility] [address] [place] [address] -   Peyton Bottoms [address] [place] [address] -   Sharolyn Douglas [address] [place] [address] -   Baldo Ash, [place] [address] [place] [address] -   Alean Rinne, MD [address] [place] [address] -   [facility] [address] [place] [address] -   Showing 1-15 of 20 Items View 15 Items   of 2     Column Settings  Pharmacies  Total: 7  Showing 1-7 of 7 Items View 15 Items  1 of 1   Name  Address  [place]  [facility]  Phone    [employer] [phone] [place] [address] [phone]   [facility] [phone] [address] [place] [address] [phone]   [facility] [phone] [place] [address] [place] [address] [phone]   [facility] [phone] [place] [address] [phone]   [facility] [phone] [address]  [place] [address] [phone]   [facility] [phone] [place] [address] [phone]   [facility] [phone] [place] [address] [phone]

## 2023-05-28 NOTE — Narrator Note (Signed)
Troponin drawn and sent.

## 2023-05-28 NOTE — Plan of Care (Signed)
Pt is being admitted with ETOH w/C, on CIWA with ativan prn, upon admission pt is calm and cooperative, afebrile,VSS, tol RA, pt does report uses O2 overnight due to sleep apnea, Pt also reported using crack cocaine recently trying to relieve opiate w/d s/s from current  pain med OxyContin use, Plan to watch for w/d s/s and treat, pt is w/c bound at baseline, sacral area has IAD  and fungal rash to groin and abd fold, under the breast area, antifungal cream ordered, purewick to keep skin clean and dry, Will cont to monitor.    Problem: Safety  Goal: Free from accidental physical injury  05/28/2023 1836 by Elnoria Howard, RN  Outcome: Progressing  05/28/2023 1835 by Elnoria Howard, RN  Outcome: Progressing     Problem: Daily Care  Goal: Daily care needs are met  Description: Assess and monitor ability to perform self care and identify potential discharge needs.  05/28/2023 1836 by Elnoria Howard, RN  Outcome: Progressing  05/28/2023 1835 by Elnoria Howard, RN  Outcome: Progressing     Problem: Pain  Goal: Patient's pain/discomfort is manageable  Description: Assess and monitor patient's pain using appropriate pain scale. Collaborate with interdisciplinary team and initiate plan and interventions as ordered. Re-assess patient's pain level 30 - 60 minutes after pain management intervention.   05/28/2023 1836 by Elnoria Howard, RN  Outcome: Progressing  05/28/2023 1835 by Elnoria Howard, RN  Outcome: Progressing     Problem: Compromised Skin Integrity  Description: Use this problem when pressure ulcers are classified as stage I or II.  Goal: Skin integrity is maintained or improved  Description: Assess and monitor skin integrity. Identify patients at risk for skin breakdown on admission and per policy. Collaborate with interdisciplinary team and initiate plans and interventions as needed.  05/28/2023 1836 by Elnoria Howard, RN  Outcome: Progressing  05/28/2023 1835 by Elnoria Howard, RN  Outcome: Progressing     Problem: Alcohol Withdrawal  Goal: Patient will demonstrate knowledge of signs/symptoms  Description: Patient will demonstrate knowledge of signs/symptoms of alcohol withdrawal, treatment options, and available community resources.  Outcome: Progressing     Problem: Safety  Goal: Free from accidental physical injury  Outcome: Progressing     Problem: Daily Care  Goal: Daily care needs are met  Description: Assess and monitor ability to perform self care and identify potential discharge needs.  Outcome: Progressing     Problem: Pain  Goal: Patient's pain/discomfort is manageable  Description: Assess and monitor patient's pain using appropriate pain scale. Collaborate with interdisciplinary team and initiate plan and interventions as ordered. Re-assess patient's pain level 30 - 60 minutes after pain management intervention.   Outcome: Progressing     Problem: Compromised Skin Integrity  Description: Use this problem when pressure ulcers are classified as stage I or II.  Goal: Skin integrity is maintained or improved  Description: Assess and monitor skin integrity. Identify patients at risk for skin breakdown on admission and per policy. Collaborate with interdisciplinary team and initiate plans and interventions as needed.  Outcome: Progressing

## 2023-05-28 NOTE — ED Notes (Signed)
Bed: 02-A  Expected date:   Expected time:   Means of arrival:   Comments:

## 2023-05-28 NOTE — Narrator Note (Signed)
CIWA = 18.  Md aware and ordered CIWA Valium orders. Pt given 20 mg po.

## 2023-05-28 NOTE — Narrator Note (Signed)
Some nausea noted.  Md aware and was given ODT Zofran.
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
## 2023-05-29 ENCOUNTER — Inpatient Hospital Stay (HOSPITAL_BASED_OUTPATIENT_CLINIC_OR_DEPARTMENT_OTHER): Payer: No Typology Code available for payment source

## 2023-05-29 DIAGNOSIS — R0602 Shortness of breath: Secondary | ICD-10-CM | POA: Diagnosis not present

## 2023-05-29 DIAGNOSIS — R4182 Altered mental status, unspecified: Secondary | ICD-10-CM | POA: Diagnosis not present

## 2023-05-29 DIAGNOSIS — F10930 Alcohol use, unspecified with withdrawal, uncomplicated: Secondary | ICD-10-CM | POA: Diagnosis not present

## 2023-05-29 DIAGNOSIS — G4733 Obstructive sleep apnea (adult) (pediatric): Secondary | ICD-10-CM

## 2023-05-29 DIAGNOSIS — R45850 Homicidal ideations: Secondary | ICD-10-CM | POA: Diagnosis not present

## 2023-05-29 DIAGNOSIS — R400 Somnolence: Secondary | ICD-10-CM

## 2023-05-29 DIAGNOSIS — I5032 Chronic diastolic (congestive) heart failure: Secondary | ICD-10-CM | POA: Diagnosis not present

## 2023-05-29 DIAGNOSIS — F1490 Cocaine use, unspecified, uncomplicated: Secondary | ICD-10-CM | POA: Diagnosis not present

## 2023-05-29 LAB — HEPATIC FUNCTION PANEL
ALANINE AMINOTRANSFERASE: 56 U/L — ABNORMAL HIGH (ref 12–45)
ALBUMIN: 4.3 g/dL (ref 3.4–5.2)
ALKALINE PHOSPHATASE: 194 U/L — ABNORMAL HIGH (ref 45–117)
ASPARTATE AMINOTRANSFERASE: 28 U/L (ref 8–34)
BILIRUBIN DIRECT: <0.2 mg/dL (ref 0.0–0.2)
BILIRUBIN TOTAL: 0.4 mg/dL (ref 0.2–1.0)
TOTAL PROTEIN: 7.3 g/dL (ref 6.4–8.2)

## 2023-05-29 LAB — VENOUS BLOOD GAS
VBG POTASSIUM: 3.7 mmol/L (ref 3.5–5.1)
VBG POTASSIUM: 3.6 mmol/L (ref 3.5–5.1)
VBG POTASSIUM: 4.7 mmol/L (ref 3.5–5.1)
VBG POTASSIUM: 4.2 mmol/L (ref 3.5–5.1)
VBG POTASSIUM: 3.9 mmol/L (ref 3.5–5.1)
VEN PERCENT OXYGEN SATURATION: 99.3 %
VEN PERCENT OXYGEN SATURATION: 99.2 %
VEN PERCENT OXYGEN SATURATION: 97.3 %
VEN PERCENT OXYGEN SATURATION: 88.6 %
VEN PERCENT OXYGEN SATURATION: 92 %
VENOUS BASE EXCESS: 1.5 mmol/L (ref 2–3)
VENOUS BASE EXCESS: 1.7 mmol/L (ref 2–3)
VENOUS BASE EXCESS: 1 mmol/L (ref 2–3)
VENOUS BASE EXCESS: 1.2 mmol/L (ref 2–3)
VENOUS BASE EXCESS: 2.3 mmol/L (ref 2–3)
VENOUS BICARBONATE: 30 — ABNORMAL HIGH (ref 23–28)
VENOUS BICARBONATE: 29.1 — ABNORMAL HIGH (ref 23–28)
VENOUS BICARBONATE: 29 — ABNORMAL HIGH (ref 23–28)
VENOUS BICARBONATE: 31 — ABNORMAL HIGH (ref 23–28)
VENOUS BICARBONATE: 30.7 — ABNORMAL HIGH (ref 23–28)
VENOUS FIO2: 28
VENOUS FIO2: 28
VENOUS FIO2: 32
VENOUS FIO2: 30
VENOUS FIO2: 30
VENOUS PARTIAL CARBON DIOXIDE: 61 mmHg — ABNORMAL HIGH (ref 41.0–51.0)
VENOUS PARTIAL CARBON DIOXIDE: 55 mmHg — ABNORMAL HIGH (ref 41.0–51.0)
VENOUS PARTIAL CARBON DIOXIDE: 57 mmHg — ABNORMAL HIGH (ref 41.0–51.0)
VENOUS PARTIAL CARBON DIOXIDE: 69 mmHg — ABNORMAL HIGH (ref 41.0–51.0)
VENOUS PARTIAL CARBON DIOXIDE: 60 mmHg — ABNORMAL HIGH (ref 41.0–51.0)
VENOUS PARTIAL PRESSURE OXYGEN: 138 mmHG
VENOUS PARTIAL PRESSURE OXYGEN: 135 mmHG
VENOUS PARTIAL PRESSURE OXYGEN: 88 mmHG
VENOUS PARTIAL PRESSURE OXYGEN: 56 mmHG
VENOUS PARTIAL PRESSURE OXYGEN: 61 mmHG
VENOUS PATIENTS TEMP: 97.8
VENOUS PATIENTS TEMP: 98.2
VENOUS PATIENTS TEMP: 97.4
VENOUS PATIENTS TEMP: 97.2
VENOUS PATIENTS TEMP: 96.5
VENOUS PEEP: 10
VENOUS PEEP: 10
VENOUS PRESSURE SUPPORT: 12
VENOUS PRESSURE SUPPORT: 12
VENOUS TIDAL VOLUME: 792
VENOUS pH: 7.3 — ABNORMAL LOW (ref 7.31–7.41)
VENOUS pH: 7.33 (ref 7.31–7.41)
VENOUS pH: 7.31 (ref 7.31–7.41)
VENOUS pH: 7.26 — ABNORMAL LOW (ref 7.31–7.41)
VENOUS pH: 7.32 (ref 7.31–7.41)

## 2023-05-29 LAB — BASIC METABOLIC PANEL
ANION GAP: 13 mmol/L (ref 10–22)
BUN (UREA NITROGEN): 10 mg/dL (ref 7–18)
CALCIUM: 9.2 mg/dL (ref 8.5–10.5)
CARBON DIOXIDE: 26 mmol/L (ref 21–32)
CHLORIDE: 99 mmol/L (ref 98–107)
CREATININE: 0.9 mg/dL (ref 0.4–1.2)
ESTIMATED GLOMERULAR FILT RATE: >60 mL/min (ref >60)
Glucose Random: 146 mg/dL (ref 74–160)
POTASSIUM: 4.1 mmol/L (ref 3.5–5.1)
SODIUM: 138 mmol/L (ref 136–145)

## 2023-05-29 LAB — CBC WITH PLATELET
ABSOLUTE NRBC COUNT: 0 10*3/uL (ref 0.0–0.0)
HEMATOCRIT: 46 % — ABNORMAL HIGH (ref 34.1–44.9)
HEMOGLOBIN: 15.4 g/dL (ref 11.2–15.7)
MEAN CORP HGB CONC: 33.5 g/dL (ref 31.0–37.0)
MEAN CORPUSCULAR HGB: 31.6 pg (ref 26.0–34.0)
MEAN CORPUSCULAR VOL: 94.3 fl (ref 80.0–100.0)
MEAN PLATELET VOLUME: 12.1 fL (ref 8.7–12.5)
NRBC %: 0 % (ref 0.0–0.0)
PLATELET COUNT: 289 10*3/uL (ref 150–400)
RBC DISTRIBUTION WIDTH STD DEV: 46.4 fL — ABNORMAL HIGH (ref 35.1–46.3)
RED BLOOD CELL COUNT: 4.88 M/uL (ref 3.90–5.20)
WHITE BLOOD CELL COUNT: 7.8 10*3/uL (ref 4.0–11.0)

## 2023-05-29 LAB — BLOOD SUGAR FINGERSTICK (POINT OF CARE): FINGERSTICK GLUCOSE: 132 mg/dl (ref 74–160)

## 2023-05-29 LAB — EKG

## 2023-05-29 MED ORDER — NALOXONE HCL 0.4 MG/ML IJ SOLN
INTRAMUSCULAR | Status: AC
Start: 2023-05-29 — End: 2023-05-29
  Administered 2023-05-29: 0.8 mg via INTRAVENOUS
  Filled 2023-05-29: qty 2

## 2023-05-29 MED ORDER — NALOXONE HCL 0.4 MG/ML IJ SOLN
0.80 mg | Freq: Once | INTRAMUSCULAR | Status: AC
Start: 2023-05-29 — End: 2023-05-29
  Administered 2023-05-29: 0.8 mg via INTRAVENOUS

## 2023-05-29 MED ORDER — NALOXONE HCL 0.4 MG/ML IJ SOLN
0.40 mg | Freq: Once | INTRAMUSCULAR | Status: AC
Start: 2023-05-29 — End: 2023-05-29
  Administered 2023-05-29: 0.4 mg via INTRAVENOUS
  Filled 2023-05-29: qty 1

## 2023-05-29 MED ORDER — POLYETHYLENE GLYCOL 3350 17 G PO PACK
17.0000 g | PACK | Freq: Every day | ORAL | Status: DC
Start: 2023-05-29 — End: 2023-06-02
  Administered 2023-05-29 – 2023-06-02 (×2): 17 g via ORAL
  Filled 2023-05-29 (×5): qty 1

## 2023-05-29 MED ORDER — MICONAZOLE NITRATE 2 % EX OINT
TOPICAL_OINTMENT | Freq: Every day | CUTANEOUS | Status: DC | PRN
Start: 2023-05-29 — End: 2023-06-02
  Filled 2023-05-29 (×2): qty 57

## 2023-05-29 MED ORDER — NALOXONE HCL 0.4 MG/ML IJ SOLN
INTRAMUSCULAR | Status: DC
Start: 2023-05-29 — End: 2023-05-29
  Filled 2023-05-29: qty 1

## 2023-05-29 MED ORDER — NALOXONE HCL 0.4 MG/ML IJ SOLN
0.80 mg | Freq: Once | INTRAMUSCULAR | Status: AC
Start: 2023-05-29 — End: 2023-05-29
  Administered 2023-05-29: 0.8 mg via INTRAVENOUS
  Filled 2023-05-29: qty 2

## 2023-05-29 MED ORDER — SODIUM CHLORIDE 0.9 % IV SOLN
0.9000 mg/h | INTRAVENOUS | Status: DC
Start: 2023-05-29 — End: 2023-05-30
  Administered 2023-05-29: 0.6 mg/h via INTRAVENOUS
  Administered 2023-05-29 – 2023-05-30 (×2): 0.9 mg/h via INTRAVENOUS
  Filled 2023-05-29 (×3): qty 10

## 2023-05-29 MED ORDER — SENNOSIDES 8.6 MG PO TABS
17.2000 mg | ORAL_TABLET | Freq: Two times a day (BID) | ORAL | Status: DC | PRN
Start: 2023-05-29 — End: 2023-06-02

## 2023-05-29 NOTE — Progress Notes (Signed)
PT consult received, medical chart reviewed. Pt sleeping, non arousable to voice or sternal rub. RN aware and reports pt has been sleeping all day. PT will follow up tomorrow.     Quaran Kedzierski C. Doristine Counter, PT, Lic # [id number]
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
## 2023-05-29 NOTE — Plan of Care (Addendum)
Pt is drowsy/lethargic,arousable, took her PO meds and went back to sleep, unable to stay awake, holding off on sedatives, CIWA in progress, pt is w/c bound at baseline, sacral area has IAD  and fungal rash to groin and abd fold, under the breast area, antifungal cream ordered, purewick to keep skin clean and dry, Will cont to monitor.     6 pm, pt cont to be lethargic/unarousable, RR 6-8, apneic, maintaining O2 sat >92% on 2L O2 via NC,  series of VBGs done, Plan for transfer to the ICU,     Problem: Safety  Goal: Free from accidental physical injury  05/28/2023 1836 by Elnoria Howard, RN  Outcome: Progressing  05/28/2023 1835 by Elnoria Howard, RN  Outcome: Progressing     Problem: Daily Care  Goal: Daily care needs are met  Description: Assess and monitor ability to perform self care and identify potential discharge needs.  05/28/2023 1836 by Elnoria Howard, RN  Outcome: Progressing  05/28/2023 1835 by Elnoria Howard, RN  Outcome: Progressing     Problem: Pain  Goal: Patient's pain/discomfort is manageable  Description: Assess and monitor patient's pain using appropriate pain scale. Collaborate with interdisciplinary team and initiate plan and interventions as ordered. Re-assess patient's pain level 30 - 60 minutes after pain management intervention.   05/28/2023 1836 by Elnoria Howard, RN  Outcome: Progressing  05/28/2023 1835 by Elnoria Howard, RN  Outcome: Progressing     Problem: Compromised Skin Integrity  Description: Use this problem when pressure ulcers are classified as stage I or II.  Goal: Skin integrity is maintained or improved  Description: Assess and monitor skin integrity. Identify patients at risk for skin breakdown on admission and per policy. Collaborate with interdisciplinary team and initiate plans and interventions as needed.  05/28/2023 1836 by Elnoria Howard, RN  Outcome: Progressing  05/28/2023 1835 by Elnoria Howard, RN  Outcome: Progressing      Problem: Alcohol Withdrawal  Goal: Patient will demonstrate knowledge of signs/symptoms  Description: Patient will demonstrate knowledge of signs/symptoms of alcohol withdrawal, treatment options, and available community resources.  Outcome: Progressing     Problem: Safety  Goal: Free from accidental physical injury  Outcome: Progressing     Problem: Daily Care  Goal: Daily care needs are met  Description: Assess and monitor ability to perform self care and identify potential discharge needs.  Outcome: Progressing     Problem: Pain  Goal: Patient's pain/discomfort is manageable  Description: Assess and monitor patient's pain using appropriate pain scale. Collaborate with interdisciplinary team and initiate plan and interventions as ordered. Re-assess patient's pain level 30 - 60 minutes after pain management intervention.   Outcome: Progressing     Problem: Compromised Skin Integrity  Description: Use this problem when pressure ulcers are classified as stage I or II.  Goal: Skin integrity is maintained or improved  Description: Assess and monitor skin integrity. Identify patients at risk for skin breakdown on admission and per policy. Collaborate with interdisciplinary team and initiate plans and interventions as needed.  Outcome: Progressing

## 2023-05-29 NOTE — Plan of Care (Signed)
Problem: Safety  Goal: Free from accidental physical injury  Outcome: Progressing     Problem: Daily Care  Goal: Daily care needs are met  Description: Assess and monitor ability to perform self care and identify potential discharge needs.  Outcome: Progressing     Problem: Alcohol Withdrawal  Goal: Patient will demonstrate knowledge of signs/symptoms  Description: Patient will demonstrate knowledge of signs/symptoms of alcohol withdrawal, treatment options, and available community resources.  Outcome: Progressing  Goal: Patient will maintain physiological stability  Outcome: Progressing     Problem: Hemodynamic Status  Goal: Patient has stable vital signs and fluid balance  Description: Assess and monitor patient's heart rate, rhythm, respiratory rate, peripheral pulses, capillary refill, color, body temperature, intake and output, labs and physical activity tolerance.   Observe for signs of chest pain (note location, duration, severity, radiation and associated symptoms such as diaphoresis, nausea, indigestion).  Monitor for signs and symptoms of heart failure (eg. shortness of breath, edema of feet/ankles/legs, rapid irregular heart rate, coughing, wheezing, white/pink blood tinged sputum, sudden weight gain, chest pain). Collaborate with interdisciplinary team and initiate plan and interventions as ordered.  Outcome: Progressing     Problem: Inadequate Airway Clearance  Goal: Patient will maintain patent airway  Description: Assess and monitor breath sounds, cough and sputum (if present), and intake/output. Collaborate with respiratory therapy to administer medications and treatments.   Outcome: Progressing  Goal: Patient will achieve/maintain normal respiratory rate/effort  Description: Respiratory rate and effort will be within normal limits for the patient  Outcome: Progressing     Problem: Inadequate Gas Exchange  Goal: Patient is adequately oxygenated and ventilation is improved  Description: Assess and  monitor vital signs, oxygen saturation, respiratory status to include rate, depth, effort, and lung sounds, mental status, cyanosis, and labs (ABG's).  Monitor effects of medications that may sedate the patient.  Collaborate with respiratory therapy to administer medications and treatments.  Outcome: Progressing  Goal: Nutritional status is improving  Description: Monitor and assess patient for malnutrition (ex- brittle hair, bruises, dry skin, pale skin and conjunctiva, muscle wasting, smooth red tongue, and disorientation). Collaborate with interdisciplinary team and initiate plan and interventions as ordered.  Monitor patient's weight and dietary intake as ordered or per policy. Utilize nutrition screening tool and intervene per policy. Determine patient's food preferences and provide high-protein, high-caloric foods as appropriate.   Outcome: Progressing

## 2023-05-29 NOTE — Event Note (Addendum)
Called to bedside by nurse who was concerned that pt was not arousable. Pt opening eyes to sternal rub but not answering any questions or able to say her name. Vitals were stable. Satting 95% on 2L. BP and HR wnl.  Glucose 132. No respiratory distress noted. Pt did receive 8 mg of ativan total during course of this shift which likely explains her drowsiness. Will get VBG to confirm she is not hypercarbic but otherswise since she is not in respiratory distress and her oxygen requirement isn't changing so  ok to keep on the floor and no further change in management at this time. Will follow up VBG    Robbie Lis, DO  PGY-2

## 2023-05-29 NOTE — Progress Notes (Signed)
PROGRESS NOTE  05/29/2023    PATIENT INFO: Cheryl Zimmerman, [age] year old female  DATE OF ADMISSION: 05/28/23    ROOM/BED LOCATION:  423/423-A  HOSPITAL DAY: 1    REVIEW OF OVERNIGHT EVENTS:  Resident called to bedside by nurse who was concerned that pt was not arousable. Pt opening eyes to sternal rub but not answering any questions or able to say her name. Vitals were stable. Satting 95% on 2L. BP and HR wnl.  Glucose 132. No respiratory distress noted. Pt did receive 8 mg of ativan total during course of this shift which likely explains her drowsiness. VBG done to confirm she is not hypercarbic. She was not in respiratory distress and her oxygen requirement did not change.     SUBJECTIVE:  She was sleepy and converses but falls back to sleep. The pt was able to verbalize that she is currently in the hospital. Patient spoke about having diarrhea, she also states that she did take substances and she feels like she is being knocked out.  She remains arousable.    OBJECTIVE:    VITAL SIGNS      05/29/23  1052 05/29/23  1404 05/29/23  1812 05/29/23  1816   BP:  93/61  112/80   Pulse:  97  102   Resp:  20 (!) 7    Temp:  97.6 F (36.4 C)  97.8 F (36.6 C)   TempSrc:  Axillary  Oral   SpO2: 97% 92%  94%   Weight:           INS/OUTS (PAST 24 HOURS)  No intake/output data recorded.    PHYSICAL EXAM:  HEENT: Anicteric, extra-ocular movements full, oropharynx clear, mucus membranes moist  Neck: Supple with full range of motion, no thyromegaly  Musculoskeletal: Normal bulk and tone, joints without swelling or erythema  Lymphnodes: No cervical, axillary, or inguinal lymphadenopathy palpated  Neuro: Somnolent but arousable with sternal rub  The rest of exam limited by body habitus and patient's drowsy presentation with really loud snores on exam.     T/L/D:   Peripheral IV 05/28/23 Anterior;Left Hand (Active)   Site Assessment Clean;Dry;Intact 05/29/23 0900   Line Status Blood return noted;Flushed;Saline locked  05/29/23 0900   Dressing Status Clean;Dry;Intact 05/29/23 0900   Dressing Change Due (If No then N/A) 06/04/23 05/29/23 0800   Number of days: 1       RECENT LABS:  Chemistries:  Recent Labs     05/28/23  1108 05/29/23  0728   NA 135* 138   K 4.1 4.1   CO2 18* 26   BUN 4* 10   CREAT 0.7 0.9   CA 9.2 9.2   ANION 18 13   GFR > 60 > 60    GI:  Recent Labs     05/28/23  1108 05/29/23  0728   AST 38* 28   ALT 67* 56*   TBILI 0.3 0.4   DBILI < 0.2 < 0.2   IBIL TNP TNP   ALKPHOS 182* 194*   ALBUMIN 4.2 4.3      CBC:  Recent Labs     05/28/23  1108 05/29/23  0728   WBC 13.2* 7.8   HGB 15.5 15.4   HCT 45.2* 46.0*   PLTA 340 289    Coags:   No results for input(s): "INR", "PT", "APTT", "HPTT", "FIB" in the last 72 hours.   Trops, BNP, D-dimer, Lactate, C-RP:  Recent Labs  05/28/23  1108 05/28/23  1342   TROPTHS  --  8   PROBNP 74  --        Finger Sticks:  Recent Labs     05/29/23  0349   FINGERSTICKR 132    Urinalysis:  No results for input(s): "UACOL", "UACLA", "UAGLU", "UABIL", "UAKET", "SPEGRAVURINE", "UAOCC", "UAPH", "UAPRO", "UABAC", "UARBC", "UAWBC", "UANIT", "LEUKOCYTES", "UAMIC", "UASQE" in the last 72 hours.    Invalid input(s): "UAOBU"       Other important labs include   Venous Blood Gas  Final Specimen Type: Blood, venous  Collected: 05/29/2023 0846  Last Updated: 05/29/2023 0851  VENOUS pH  7.33  VENOUS PARTIAL CARBON DIOXIDE  55  VENOUS BICARBONATE  29.1  VBG POTASSIUM  3.6    MICROBIOLOGY REVIEW  No new microbiology results to review.    IMAGING AND OTHER STUDIES  CT Head WO Contrast  Final Collected: 05/29/2023 1703  Last Updated: 05/29/2023 1736  Impression  CT scan of the head is degraded by motion artifact and reveals: 1. No acute intracranial findings visualized.     MEDICATIONS  See list in EPIC    ASSESSMENT & PLAN   Cheryl Zimmerman is a 52 year old English-speaking female with complex psychiatric history (PTSD, bipolar disease) admitted with request for assistance in supporting withdrawal from opioids.   She has a long history of alcohol and cocaine use, but not narcotics.  Was recently hospitalized in [place] with a trimalleolar ankle fracture requiring ORIF.  She was discharged on opioids which got her into a bit of trouble over the last 3 weeks. Opioid withdrawal symptoms seemed mild on admission. Morbidly obese, wheelchair bound from an injury sustained during a previous suicide attempt. Awaiting consult liaison psych. On standing suboxone to manage both withdrawal and chronic pain syndrome. Her psych regimen includes about 10 meds, definitely inspires one to think about opportunities for deprescribing. Today she is increasingly somnolent - difficult to arouse, worsened throughout the day. Holding sedating meds. VBG showed mild CO2 retention. CT head unremarkable.     #Somnolence   #Hypercarbia  Hypercarbia seen on repeat VBG's.  Bicarb has been normal and stable however.  Patient received a total of 8 mg of Ativan overnight.  Patient increasingly somnolent throughout the night and throughout the day, however has been arousable, but unable to stay awake.  Patient is on Suboxone and had a urine tox positive for alcohol and opioids.  Suspect medication interference, and will work on de-prescribing at this time.  Also on the differential would be subdural/epidural hemorrhage and possibly trauma during her intoxicated states, or toxidrome syndrome however less likely as patient is not sweating and her pupils are not constricted or dilated.  [ ]  Discontinued melatonin, gabapentin, baclofen, mirtazapine, prazosin and Ativan.  [ ]  CT with no acute intracranial findings.  [ ]  f/u ICU eval, need to shift for BiPAP trial?  [ ]  see patient early in night and if worsening or not waking get another VBG  [ ]  Repeat BMP and CBC in the a.m.    #Opiate withdrawal  Suboxone 8mg  TID (outpatient pain regimen) should provide adequate management of opiate withdrawal.  Patient received suboxone in the ED because she reported to the  ED provider that it had been 48 hours since her last oxycodone.  No evidence for precipitated withdrawal.     #Alcohol withdrawal  History of complicated withdrawal.  Patient states that she took half a gallon of alcohol prior to admission.  No clear physiologic signs of alcohol withdrawal -- no diarrhea, sweating, abdominal cramping, nausea, vomiting, myalgias, yawning, shivering.  [ ]  Ativan discontinued with CIWA scoring due to somnolence  [ ]  Recovery coach referral  [ ]  Continue Keppra 750 mg twice daily     #PTSD/ Depression  Patient is currently between psychiatrists and is -- I believe -- overmedicated with psychiatric medications  Not clear that she needs inpatient psychiatric care.  -Consulted psychiatry for evaluation of her mental health and her prescribed regimen     #Discharge planning  Patient lives alone in a wheelchair accessible apartment, but feels overwhelmed and believes that she needs additional support at home.  She is struggling to manage her non-weight bearing status alone and requests VNA and home PT and (perhaps) additional personal care. She is not interested in short term rehab     #Acute on chronic pain  Will use standing Tylenol and prn Motrin  Continue home gabapentin and baclofen for muscle spasms  TID suboxone should treat both pain and withdrawal    #Skin lesion  Suspect fungal infection.  Apply miconazole nitrate ointment on rash in sacrum, abdominal folds, and skin under the breast.    #Home Meds  Continue Lipitor 80 mg nightly  Continue Symbicort twice daily  Continue Bumex 4 mg daily  Continue duloxetine 30 mg daily  Continue Synthroid 25 mcg every morning before breakfast  Continue oxcarbazepine the pain 150 mg twice daily  Continue spironolactone 100 mg daily    #Homicidal ideation (Low Concern)  Suspect that with appropriate care of her symptoms, her homicidal feelings will subside  When arousable, she denies any desire to harm anyone in the hospital  She is willing to  contract for safety and denies any intent to elope from the hospital to harm others  No need for 1:1 sitter in my assessment    # Tele: No  # Foley: No  # FEN Orders Placed This Encounter      Diet - Base Diet: Regular  Protonix 40 mg daily  # DVT ppx: Heparin subcu  # Bowel regimen: Senna PRN, MiraLAX  # Language: English   # Dispo: Given acuity of illness, patient will remain an inpatient admission      Medication Reconciliation: Done    Code Status: Full Code  Health care proxy: Data Unavailable  Data Unavailable      Discussed with attending, Traci Sermon, MD    [place]  PGY1 - Internal Medicine   Pager [phone]  05/29/2023

## 2023-05-29 NOTE — Care Coordination (Signed)
SW attempted to visit pt at bedside. Pt appeared to be in a deep sleep, and SW did not want to awaken pt. SW will attempt to complete an assessment of pt tomorrow, 05/30/23. SW to f/u as needed.
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
## 2023-05-29 NOTE — Discharge Inst - Reason you came to the hospital (Addendum)
Reason you came to the hospital: opiate withdrawal     Discharge Diagnosis:   Polysubstance abuse and overdose   Acute hypercapnic respiratory failure   Suspected obesity hypoventilation syndrome along with obstructive sleep apnea     Description of Hospital Course:   He came to the hospital with complaints and signs of opioid withdrawal and was found to be very sleepy and difficult to stay awake due to this.  We had to transfer to the ICU as you needed a Narcan drip and a BiPAP.  With this treatment he became more awake.  However we had to stop a lot of your home medications that were also causing you to be drowsy and very sleepy.  We discussed your medication with the psychiatry team here at [facility] and your primary care physician Dr. [name] who agrees with the plan to discontinue this medications at discharge with a follow-up appointment to discuss your medication regimen.  At this time you are safe to go home as you are now past your withdrawal.    Important Results:   Low vitamin D levels    Recommendations:   Follow-up with your primary care doctor Dr. Otilio Miu in 1 week.    -You should call your primary care doctor's office to refill your Suboxone as were only able to prescribe 1 week supply.    -You will need to discuss your medication regimen and the medications that were discontinued here at the hospital with them.  -He will need to discuss the need for an outpatient sleep study to assess for obstructive sleep apnea.  -Follow-up with orthopedic surgery for right ankle fracture.  -Please follow-up with [facility] to get an outpatient psychiatry provider.    We have stopped several of your home medications that likely contributed to making you very somnolent and affected your breathing including Prazosin, Gabapentin, Fluoxetine, Buspirone, methocarbamol, mirtazapine, Seroquel, tramadol you can talk with your PCP Dr. [name] about the need of restarting meds.     Medications you should continue:  -Continue  suboxone 8mg  3 times a day.  -please only continue baclofen only as needed.  -Please only take 1 tablet of your 5 mg melatonin at night instead of 3 tablets.  -Please take your vitamin D as prescribed as you are deficient.  -Please use the antifungal cream in between folds that are painful.    You can also take 650 mg of Tylenol every 6-8 hours as needed for pain - do not take more than a total of 2600 mg in a 24 hour period.    Go to the emergency room if you have:  Fever, chills, chest pain, trouble breathing, nausea, vomiting, bleeding, or severe symptoms of any kind.    It was our pleasure taking care of you. We wish you the best with your recovery.     Sincerely,   Your [facility] Team

## 2023-05-29 NOTE — Event Note (Addendum)
1630.  Consult requested by primary team for excessive sedation not completely explained by sedative meds.  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  Restarted on home meds (including many with sedative properties), also on Suboxone 8 mg thrice daily and lorazepam on CIWA protocol (last received a dose at 1:21 AM).  Over the course of the night and today concern for fluctuating sedation despite holding off any sedative agent since 946 this a.m.  Primary team suspects component of CO2 retention in view of past showing uncompensated respiratory acidosis.  Also sent for emergent head CT imaging to rule out any structural causes for altered consciousness.  On examination:  Vital signs: GCS 8 on 15.  Respiratory rate 6-10 per/min SpO2 92% BP 93/61 heart rate high 90s.  Laying slumped in bed with snoring noted.  Respiratory system: Poor air entry bilaterally.  No added sounds  CVS: S1-S2 heard, no added sounds  Neurological exam: Severely limited by patient's mental status, however appears grossly nonfocal  Per abdomen: Soft nontender no organomegaly.    Review of lab results:   pCO2 of 55 with bicarb of 26 and pH of 7.33 -acute respiratory acidosis.  No other significant metabolic abnormalities.  LFTs normal and platelets 289, albumin 4.3-no known cirrhosis.  CT head with no acute intracranial findings.    Assessment and plan:  Probable multifactorial drowsiness in the setting of polypharmacy and component of respiratory acidosis.  Will shift to ICU for closer monitoring and BiPAP (12/5) trial to see if this helps the mental status.  Will also consider Narcan.  Discussed plan with primary team and ICU attending Dr. Jason Fila.  Michaelyn Barter  PGY-3

## 2023-05-29 NOTE — Care Coordination (Signed)
Discussed in MDR. Chart reviewed. Pt is a 52 y/o female w/ hx of complex psychiatric history, morbid obesity s/p recent ORIF for trimalleolar fracture presenting for medical assistance with withdrawal symptoms. Monitoring labs, placed on CIWA protocol. CM attempted to meet w/ the pt at bedside, but she was sleepy and falls back to sleep, the pt was able to verbalize that she is currently in the hospital. Per chart review during the past admission: lives alone in an apartment. No current VNA services.    Per [facility] CM Caitlin:  Services as follows for [employer]   PCP: [facility]  [facility] for Personal Care agency 8.5 hours/week (5 hours for personal care and 3.5 hour for household task). Companion services for 2.5 hours/week for shopping/transportation.  14 meals per week via [employer]  Home O2 via [employer]  DME: Seated rollator, oximeter, twin mattress cushion, leg elevation pillows, compression stockings, knee brace, rolling walker, raised toilet seat  HCP dated 03/17/20 available in eCW, available in [facility]   Recently discharged from [facility] 05/11/23    Dispo pending recs and medical clearance. Will need an ambulance for transportation at dc.         05/29/23 1239   Admission   Reviewed for admission Yes   Observation Notice delivered to patient No   Does patient have prescription drug coverage? Yes   Reason for Admission 1) Opiate withdrawal 2) Alcohol withdrawal   Readmission Assessment  No   Mental Status Upon Admission   Mental Status Upon Admission L   Demographics   Demographic Information Correct Yes   Patient Status   Is the patient own decision-maker? Yes   Is a Social Work consult needed for Health Care Proxy or Guardianship? Yes   Caregiver   Does the patient have a caregiver? No   Psychosocial   Admitted From: Home   Challenges Adjustment to diagnosis/injury/illness;Low/high health care utlization history;Personal or family crisis;Poor adherance to health care  recommendations;Psychosocial issues;Questionable ability for self care or safety;Substance use   Community Support Primary Care Provider   Primary Caretaker for Someone No   Providing self care at home? Yes   Functional Screen   Level of function on admission B   Toileting Independence   Feeding Independence   Transfers Independent   Ambulation Dependent   Mobility Devices Rollator;Rolling walker   Living Situation   Lives with Alone   Type of Residence House/apartment   Living Setting Apartment   Anticipated Discharge Plan   Expected Discharge Date 05/31/23   Anticipated Outcome Home With Home Health   Transportation at Discharge Ambulance   Patient expects to be discharged to: Home   Interpreter Requested No   Home Care Services Yes   Type of Home Care Services Homehealth aide;Meals on Wheels   Home Living   Home Equipment Walker  (Home O2 via [employer]    DME: Seated rollator, oximeter, twin mattress cushion, leg elevation pillows, compression stockings, knee brace, rolling walker, raised toilet seat)

## 2023-05-29 NOTE — Progress Notes (Signed)
Patient transferred to ICU secondary to unresponsiveness, and placed on Bipap.

## 2023-05-29 NOTE — RN Shift Note (Signed)
Patient arrived on unit at 55  Unresponsive. On bipap.  Narcan total 1.2mg  given with some effect. Opens eyes to name and tactile -stimuli, but unsustained. Resps 7-9,   Narcan drip ordered-awaiting from pharmacy.Further 0.8mg  given.  Narcan drip now infusing at 0.9mg /hr    VBG at 2230 improved, PH 7.32, pco2 60  Resps in the teens  Patient more easily roused, but does not stay awake for long. Answers one or two questions, and doses off. Will nod her head when informed of situation/hosp/care etc. But not consistently.  Bladder scan over 999, awoke pt, unable to void. Straight cath for 1425cc.  Patient coughed a few times + thick sputum. Patient did not reach for mask to remove it. Assisted with clearing secretions with yankhaur.   Narcan continues at 0.9mg /hr  Denies nausea at present  Sitter at bedside    Just after 5am patient was coughing, awoke, requesting to talk to Dr. about "what happened?", "why am I here?" seemed confused initially asking "what kind of surgery did you do on me?". Patient reoriented to reason for admission to floor, and ICU. Acknowledged she understood.   Narcan drip was stopped.   Patient stating her stomach is hurting, she's "starving". Water given and crackers. Also took am levothyroxine without issue.  Bipap off, o2 at 2 L, Sats 94%, resps 14-17

## 2023-05-29 NOTE — Plan of Care (Addendum)
Pt is alert and oriented x3, VSS on 2L NC while sleeping satting at 94%. Pt c/o 8/10 RLE pain, see eMAR. All meds given as ordered. Pt refused assessment of lung sounds and sacrum. Redness under breast and abdominal folds. Scoring on CIWA, receiving PRN ativan for withdrawal. Bed is locked and in lowest position with bed alarm on, call light in reach, care ongoing.     Pt is difficult to arouse for 0300 CIWA, MD called to bedside. VSS, blood sugar checked.  VBG collected and given to RT.      Unable to score 0500 CIWA, pt still somewhat difficult to arouse but will say small phrases and will follow some commands. MD notified.     0700 pt is lethargic but is appropriately following commands and answering questions. MD notified

## 2023-05-30 DIAGNOSIS — F1490 Cocaine use, unspecified, uncomplicated: Secondary | ICD-10-CM | POA: Diagnosis not present

## 2023-05-30 DIAGNOSIS — I480 Paroxysmal atrial fibrillation: Secondary | ICD-10-CM

## 2023-05-30 DIAGNOSIS — J9602 Acute respiratory failure with hypercapnia: Secondary | ICD-10-CM

## 2023-05-30 DIAGNOSIS — F10930 Alcohol use, unspecified with withdrawal, uncomplicated: Secondary | ICD-10-CM | POA: Diagnosis not present

## 2023-05-30 DIAGNOSIS — J9601 Acute respiratory failure with hypoxia: Secondary | ICD-10-CM

## 2023-05-30 DIAGNOSIS — I5032 Chronic diastolic (congestive) heart failure: Secondary | ICD-10-CM | POA: Diagnosis not present

## 2023-05-30 DIAGNOSIS — G9341 Metabolic encephalopathy: Secondary | ICD-10-CM

## 2023-05-30 LAB — CBC WITH PLATELET
ABSOLUTE NRBC COUNT: 0 10*3/uL (ref 0.0–0.0)
HEMATOCRIT: 44.8 % (ref 34.1–44.9)
HEMOGLOBIN: 14.7 g/dL (ref 11.2–15.7)
MEAN CORP HGB CONC: 32.8 g/dL (ref 31.0–37.0)
MEAN CORPUSCULAR HGB: 31.3 pg (ref 26.0–34.0)
MEAN CORPUSCULAR VOL: 95.3 fl (ref 80.0–100.0)
MEAN PLATELET VOLUME: 11.8 fL (ref 8.7–12.5)
NRBC %: 0 % (ref 0.0–0.0)
PLATELET COUNT: 252 10*3/uL (ref 150–400)
RBC DISTRIBUTION WIDTH STD DEV: 47.7 fL — ABNORMAL HIGH (ref 35.1–46.3)
RED BLOOD CELL COUNT: 4.7 M/uL (ref 3.90–5.20)
WHITE BLOOD CELL COUNT: 7.8 10*3/uL (ref 4.0–11.0)

## 2023-05-30 LAB — BASIC METABOLIC PANEL
ANION GAP: 12 mmol/L (ref 10–22)
BUN (UREA NITROGEN): 13 mg/dL (ref 7–18)
CALCIUM: 9.2 mg/dL (ref 8.5–10.5)
CARBON DIOXIDE: 27 mmol/L (ref 21–32)
CHLORIDE: 103 mmol/L (ref 98–107)
CREATININE: 0.9 mg/dL (ref 0.4–1.2)
ESTIMATED GLOMERULAR FILT RATE: >60 mL/min (ref >60)
Glucose Random: 110 mg/dL (ref 74–160)
POTASSIUM: 4.6 mmol/L (ref 3.5–5.1)
SODIUM: 142 mmol/L (ref 136–145)

## 2023-05-30 LAB — PHOSPHORUS MAGNESIUM
MAGNESIUM: 1.9 mg/dL (ref 1.6–2.6)
PHOSPHORUS: 3.4 mg/dL (ref 2.5–4.9)

## 2023-05-30 MED ORDER — CLONIDINE HCL 0.1 MG PO TABS
0.10 mg | ORAL_TABLET | Freq: Once | ORAL | Status: AC
Start: 2023-05-30 — End: 2023-05-30
  Administered 2023-05-30: 0.1 mg via ORAL
  Filled 2023-05-30: qty 1

## 2023-05-30 MED ORDER — ENOXAPARIN SODIUM 80 MG/0.8ML IJ SOSY
70.0000 mg | PREFILLED_SYRINGE | INTRAMUSCULAR | Status: DC
Start: 2023-05-30 — End: 2023-06-02
  Administered 2023-05-30 – 2023-05-31 (×2): 70 mg via SUBCUTANEOUS
  Filled 2023-05-30 (×3): qty 1

## 2023-05-30 MED ORDER — CLONIDINE HCL 0.1 MG PO TABS
0.05 mg | ORAL_TABLET | Freq: Once | ORAL | Status: AC
Start: 2023-05-30 — End: 2023-05-30
  Administered 2023-05-30: 0.05 mg via ORAL
  Filled 2023-05-30: qty 1

## 2023-05-30 MED ORDER — FLUOXETINE HCL 10 MG PO CAPS
10.0000 mg | ORAL_CAPSULE | Freq: Every day | ORAL | Status: DC
Start: 2023-05-31 — End: 2023-05-30

## 2023-05-30 MED ORDER — DULOXETINE HCL 30 MG PO CPEP
30.0000 mg | ORAL_CAPSULE | Freq: Every day | ORAL | Status: DC
Start: 2023-05-30 — End: 2023-06-02
  Administered 2023-05-30 – 2023-06-02 (×4): 30 mg via ORAL
  Filled 2023-05-30 (×4): qty 1

## 2023-05-30 NOTE — Progress Notes (Signed)
Per char, pt with probable multifactorial drowsiness iso polypharmacy and component of respiratory acidosis, transferred to ICU  yesterday afternoon for closer monitoring and bipap. Given escalation of care, PT to complete consult and sign off at this time. Please re-consult when patient is medically appropriate and able to participate in skilled PT evaluation.     Keymora Grillot C. Doristine Counter, PT, Lic # [id number]

## 2023-05-30 NOTE — Plan of Care (Signed)
Problem: Safety  Goal: Free from accidental physical injury  Outcome: Progressing     Problem: Daily Care  Goal: Daily care needs are met  Description: Assess and monitor ability to perform self care and identify potential discharge needs.  Outcome: Progressing     Problem: Pain  Goal: Patient's pain/discomfort is manageable  Description: Assess and monitor patient's pain using appropriate pain scale. Collaborate with interdisciplinary team and initiate plan and interventions as ordered. Re-assess patient's pain level 30 - 60 minutes after pain management intervention.   Outcome: Progressing     Problem: Compromised Skin Integrity  Description: Use this problem when pressure ulcers are classified as stage I or II.  Goal: Skin integrity is maintained or improved  Description: Assess and monitor skin integrity. Identify patients at risk for skin breakdown on admission and per policy. Collaborate with interdisciplinary team and initiate plans and interventions as needed.  Outcome: Progressing     Problem: Alcohol Withdrawal  Goal: Patient will demonstrate knowledge of signs/symptoms  Description: Patient will demonstrate knowledge of signs/symptoms of alcohol withdrawal, treatment options, and available community resources.  Outcome: Progressing  Goal: Patient will maintain physiological stability  Outcome: Progressing     Problem: Knowledge Deficit  Goal: Patient/S.O. demonstrates understanding of disease process, treatment plan, medications, and discharge instructions.  Description: Complete learning assessment and assess knowledge base  Outcome: Progressing     Problem: Hemodynamic Status  Goal: Patient has stable vital signs and fluid balance  Description: Assess and monitor patient's heart rate, rhythm, respiratory rate, peripheral pulses, capillary refill, color, body temperature, intake and output, labs and physical activity tolerance.   Observe for signs of chest pain (note location, duration, severity,  radiation and associated symptoms such as diaphoresis, nausea, indigestion).  Monitor for signs and symptoms of heart failure (eg. shortness of breath, edema of feet/ankles/legs, rapid irregular heart rate, coughing, wheezing, white/pink blood tinged sputum, sudden weight gain, chest pain). Collaborate with interdisciplinary team and initiate plan and interventions as ordered.  Outcome: Progressing     Problem: Inadequate Airway Clearance  Goal: Patient will maintain patent airway  Description: Assess and monitor breath sounds, cough and sputum (if present), and intake/output. Collaborate with respiratory therapy to administer medications and treatments.   Outcome: Progressing  Goal: Patient will achieve/maintain normal respiratory rate/effort  Description: Respiratory rate and effort will be within normal limits for the patient  Outcome: Progressing     Problem: Inadequate Gas Exchange  Goal: Patient is adequately oxygenated and ventilation is improved  Description: Assess and monitor vital signs, oxygen saturation, respiratory status to include rate, depth, effort, and lung sounds, mental status, cyanosis, and labs (ABG's).  Monitor effects of medications that may sedate the patient.  Collaborate with respiratory therapy to administer medications and treatments.  Outcome: Progressing  Goal: Nutritional status is improving  Description: Monitor and assess patient for malnutrition (ex- brittle hair, bruises, dry skin, pale skin and conjunctiva, muscle wasting, smooth red tongue, and disorientation). Collaborate with interdisciplinary team and initiate plan and interventions as ordered.  Monitor patient's weight and dietary intake as ordered or per policy. Utilize nutrition screening tool and intervene per policy. Determine patient's food preferences and provide high-protein, high-caloric foods as appropriate.   Outcome: Progressing

## 2023-05-30 NOTE — Progress Notes (Signed)
ICU PROGRESS NOTE  05/30/2023      PATIENT NAME/AGE/SEX:  Cheryl Zimmerman, 52 year old female  ROOM/BED:      IC04/IC04-A  MRN:      [id number]    DATE OF HOSPITAL ADMISSION: 05/28/23    REASON FOR ICU ADMISSION:  Acute hypercapnic respiratory failure    INTERVAL/OVERNIGHT EVENTS     Patient was put on BiPAP 08/28/29% overnight, received Narcan 2 mg iv + Narcan drip now infusing at 0.9mg /hr until 0500 am. VBG at 2230 improved to admission baseline, PH 7.32, pco2 60.     SUBJECTIVE:     Patient reports having "bad withdrawal" this morning, with self-reported shakiness, swearing, jitteriness, and anxiety, with a CIWR of 11. She voices significant disappointment with her PCP for putting her through suboxone treatment and wanted to change her PCP. She adamantly refuses BiPAP treatment this morning. No headache, chest pain, or difficulty breathing endorsed.    VITAL SIGNS   05/30/23  0900 05/30/23  1010 05/30/23  1100 05/30/23  1200   BP: (!) 146/124 116/81 (!) 146/102 (!) 154/96   Pulse: 96 90 87 90   Resp: 21 17 21 24    Temp:    97.7 F (36.5 C)   TempSrc:    Temporal   SpO2: 93% 96% 95%    Weight:       Height:           INS/OUTS   I/O 24 Hrs:  In: 1849.5 [P.O.:1360; I.V.:489.5]  Out: 2175 [Urine:750]    OXYGEN/VENTILATOR SETTINGS   BiPAP 14/7/30% (patient refused in the morning)    Physical exam  General: Obese, in no acute distress  HEENT: Anicteric, extra-ocular movements full, oropharynx clear, mucus membranes moist  Neck: Supple with full range of motion, no thyromegaly  Cardiac: Rate and rhythm regular, S1 and S2 within normal limits, no murmurs/ rubs/ gallops, PMI midline, no jugular venous distension  Pulmonary: Good air movement throughout; no labored breathing noticed; no wheeze, rales, or rhonchi; no dullness to percussion  Abdomen: Soft, non-tender, non-distended, no hepatosplenomegaly, normoactive bowel sounds  Extremities: Warm and well-perfused, no edema in left leg, right lag casted  Skin: No rashes or  ulceration  Neuro: Alert and oriented x 3, speech fluent, cranial nerves 2-12 in tact, strength and gait not tested    TUBES/LINES/DRAINS  Peripheral IV 05/28/23 Anterior;Left Hand (Active)   Site Assessment Clean;Dry;Intact 05/30/23 0400   Line Status No blood return;Flushed;Saline locked 05/29/23 2000   Dressing Status Clean;Dry;Intact 05/29/23 2000   Dressing Change Due (If No then N/A) 06/05/23 05/29/23 2000   Number of days: 1       Peripheral IV 05/29/23 Left;Posterior Hand (Active)   Site Assessment Clean;Dry;Intact 05/30/23 0400   Line Status Blood return noted;Flushed;Saline locked 05/29/23 2000   Dressing Status Clean;Dry;Intact 05/29/23 2000   Dressing Intervention New dressing 05/29/23 2000   Dressing Change Due (If No then N/A) 06/05/23 05/29/23 2000   Number of days: 0       Peripheral IV 05/29/23 Posterior;Right Hand (Active)   Site Assessment Clean;Dry;Intact 05/30/23 0400   Line Status Infusing 05/30/23 0400   Dressing Status Clean;Dry;Intact 05/29/23 2023   Dressing Intervention New dressing 05/29/23 2023   Dressing Change Due (If No then N/A) 06/05/23 05/29/23 2023   Number of days: 0       Labs:  Chemistries:  Recent Labs     05/28/23  1108 05/29/23  0728 05/30/23  0436   NA  135* 138 142   K 4.1 4.1 4.6   CO2 18* 26 27   BUN 4* 10 13   CREAT 0.7 0.9 0.9   CA 9.2 9.2 9.2   MG  --   --  1.9   PHOS  --   --  3.4   ANION 18 13 12    GFR > 60 > 60 > 60    GI:  Recent Labs     05/28/23  1108 05/29/23  0728   AST 38* 28   ALT 67* 56*   TBILI 0.3 0.4   DBILI < 0.2 < 0.2   IBIL TNP TNP   ALKPHOS 182* 194*   ALBUMIN 4.2 4.3      CBC:  Recent Labs     05/28/23  1108 05/29/23  0728 05/30/23  0436   WBC 13.2* 7.8 7.8   HGB 15.5 15.4 14.7   HCT 45.2* 46.0* 44.8   PLTA 340 289 252    Coags:   No results for input(s): "INR", "PT", "APTT", "HPTT", "FIB" in the last 72 hours.   Trops, BNP, D-dimer, Lactate, C-RP:  Recent Labs     05/28/23  1108 05/28/23  1342   TROPTHS  --  8   PROBNP 74  --        Finger  Sticks:  Recent Labs     05/29/23  0349   FINGERSTICKR 132    Urinalysis:  No results for input(s): "UACOL", "UACLA", "UAGLU", "UABIL", "UAKET", "SPEGRAVURINE", "UAOCC", "UAPH", "UAPRO", "UABAC", "UARBC", "UAWBC", "UANIT", "LEUKOCYTES", "UAMIC", "UASQE" in the last 72 hours.    Invalid input(s): "UAOBU"       Micro:  Respiratory Panel  Lab Results   Component Value Date    COVID19  12/29/2022     Negative for SARS-CoV-2(2019 novel coronavirus)by PCR  methodology.  Negative results do not preclude 2019-nCoV infection and  should not be used as the sole basis for patient management  decisions. Negative results must be combined with clinical  observations, patient history, and epidemiological  information.  This test has been authorized by the FDA under an Emergency  Use Authorization (EUA) for use by authorized laboratories.      COVID19  12/10/2022     Negative for SARS-CoV-2(2019 novel coronavirus)by PCR  methodology.  Negative results do not preclude 2019-nCoV infection and  should not be used as the sole basis for patient management  decisions. Negative results must be combined with clinical  observations, patient history, and epidemiological  information.  This test has been authorized by the FDA under an Emergency  Use Authorization (EUA) for use by authorized laboratories.      Lab Results   Component Value Date    FLUA  12/29/2022     Negative for Influenza A by PCR methodology.  This test has been authorized by the FDA under an Emergency  Use Authorization (EUA) for use by authorized laboratories.      FLUB  12/29/2022     Negative for influenza B by PCR methodology.  This test has been authorized by the FDA under an Emergency  Use Authorization (EUA) for use by authorized laboratories.      RSV  12/29/2022     Negative for Respiratory Syncytial Virus by a PCR method.  This test has been authorized by the FDA under an Emergency  Use Authorization (EUA) for use by authorized laboratories.            Culture  Data  Blood Cultures  No results found for: "BLCA"  No results found for: "BLCAN"    Urine Cultures  No results found for: "UC"    Respiratory Cultures  No results found for: "RC" Gram Stain  Lab Results   Component Value Date    GS  06/04/2011      GRAM STAIN MODERATE RED BLOOD CELLS FEW POLYMORPHONUCLEAR LEUKOCYTES RARE   TO FEW GRAM POSITIVE COCCI IN PAIRS       Other Cultures:  No results found for: "WC"  No results found for: "BFC"     Other Micro  No results found for: "STREPAG"  No results found for: "MRSASAPCR"  No results found for: "CDIFFTOX"       IMAGING:   CT Head WO Contrast    Result Date: 05/29/2023  CLINICAL INDICATION: 52 years-old Female presents with Mental status change, unknown cause. COMPARISON: 12/05/2021. TECHNIQUE: CT of the head with coronal and sagittal reformations. Contrast: None. Radiation dose: Radiation dose reduction techniques were employed. CTDIvol: 58.8 mGy. DLP: 1216 mGy-cm. FINDINGS: Quality: Some of the images are degraded by motion artifact. Brain: Gray-white differentiation is maintained. There is no acute hemorrhage or focal territorial infarction. There is no midline shift or abnormal mass effect. Ventricles and CSF spaces: Unremarkable. There is no acute extra-axial collection. Paranasal sinuses: Clear. Mastoids: Status post bilateral mastoidectomy. Orbits: Unremarkable. Bones: Intact. Hyperostosis frontalis interna.  Extracranial structures: The patient is edentulous.     CT scan of the head is degraded by motion artifact and reveals: 1. No acute intracranial findings visualized.  Reviewed and Electronically Signed By: Toney Reil, MD Signed Date and Time: 05/29/2023 5:34 PM     Portable CXR on 05/30/23  Chronic cardiac silhouette enlargement and similar bibasilar opacities typical for atelectasis.    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 Decided to shift to ICU for trial of BiPAP as well as Narcan.     #Excessive sedation  #OSA  #Obesity hypoventilation syndrome  #Acute hypercapnic respiratory failure  Sedation seemed out of proportion to medications received as last doses of sedative medications received at 9:45 AM.  Neurological examination nonfocal and noncontributory.  CT brain obtained to exclude remote possibility of stroke and was normal.  Based on exam today decided trial of Narcan initially 0.4 and then 0.8 to which she responded partially for limited. In view of this it was decided to start her on a Narcan drip with a view to target respiratory rate of 12.  Given her probable undiagnosed OSA and acute hypercapnic respiratory failure, it appears reasonable to trial her on BiPAP 12/10 with serial gases as needed  -Increase BiPAP to 14/7, with 4 hours on and 4 hours off during the daytime and keep on overnight  -Consider restart narcan drip at 0.9 mg/h as needed  -Hold all further sedatives - melatonin, gabapentin, baclofen, mirtazapine, prazosin and Ativan.   -VBG tomorrow to assess for respiratory/CO2 status.  -If worsening respiratory status despite BiPAP and Narcan may need intubation.     #  Opiate withdrawal  Suboxone 8mg  TID (outpatient pain regimen) should provide adequate management of opiate withdrawal.  However, in view of her somnolence it is being held at this time.     #Alcohol withdrawal  History of complicated withdrawal.  Patient states that she took half a gallon of alcohol prior to admission. No clear physiologic signs  of alcohol withdrawal -- no diarrhea, sweating, abdominal cramping, nausea, vomiting, myalgias, yawning, shivering.  [ ]  Ativan discontinued with CIWA scoring due to patient being oversedated in this hospitalization  [ ]  Recovery coach referral  [ ]  Continue Keppra 750 mg twice daily and Trileptal 150 mg bid for known seizures     #PTSD/ Depression  Patient is currently between psychiatrists and possibly overmedicated with psychiatric medications  Not clear that she needs inpatient psychiatric care.  -Consulted psychiatry for evaluation of her mental health and her prescribed regimen  -Continue duloxetine DR 30 mg daily  -Holding all sedative medications     #Discharge planning  Patient lives alone in a wheelchair accessible apartment, but feels overwhelmed and believes that she needs additional support at home.  She is struggling to manage her non-weight bearing status alone and requests VNA and home PT and (perhaps) additional personal care. She is not interested in short term rehab     #Acute on chronic pain  Will use standing Tylenol and prn Motrin  -Holding home gabapentin and baclofen is addictive     #Skin lesion  Suspect fungal infection.  Apply miconazole nitrate ointment on rash in sacrum, abdominal folds, and skin under the breast.     #Home Meds  Continue Lipitor 80 mg nightly  Continue Symbicort twice daily  Continue Bumex 4 mg daily  Continue duloxetine 30 mg daily  Continue Synthroid 25 mcg every morning before breakfast  Continue oxcarbazepine the pain 150 mg twice daily  Continue spironolactone 100 mg daily  04/08/2022 LVEF 65%  July 2023 at [facility]: discharge with home diuretic regimen: bumex 3 mg BID, spironolactone 100 mg daily     #Homicidal ideation (Low Concern)  Suspect that with appropriate care of her symptoms, her homicidal feelings will subside  When arousable, she denies any desire to harm anyone in the hospital  She is willing to contract for safety and denies any intent to elope from the  hospital to harm others     # Tele: Yes  # Foley: Yes for urinary retention  # Diet: N.p.o. pending improvement of respiratory status  Protonix 40 mg daily  # DVT ppx: switch to enoxaparin 70 mg subq daily  # Bowel regimen: Senna PRN, MiraLAX  # Language: English   # Dispo: ICU given  Medication Reconciliation: Done     Code Status: Full Code  Health care proxy: Data Unavailable  Data Unavailable    DISPOSITION  ICU level of care until hypercapnia improves    Medication Reconciliation: Done  Changes from home medication regimen: hold all sedative medications: melatonin, gabapentin, baclofen, mirtazapine, prazosin and Ativan    Health care proxy: Data Unavailable  Data Unavailable    Lizabeth Leyden, MD  PGY1 [phone]  Discussed with intensivist, Dr. Alinda Money

## 2023-05-30 NOTE — Miscellaneous (Signed)
Md requesting Bipap on pt for 4 hours on/4 hours off. I went into pt who adamantly refused I explained her  sleep apnea issues/concerns of team and the need when she sleeps if even for tonight. Pt still refused and was yelling "to get out". MD team aware.Will follow

## 2023-05-30 NOTE — Care Coordination (Signed)
CASE MANAGER REASSESSMENT NOTE      Case Reviewed with: MDR attendants     Level of Care:  Inpatient    High Risk Huddle: High Risk Huddle Not Indicated       Next review date if insurance: Daily    Change in status from initial assessment: N/A    New information that will affect transition plan: N/A    Anticipated Discharge plan: Pt discussed in MDR. Pt is not medically clear for DC. Per team continue with withdrawal signs, on 02 at 2L, refused Bipap. Possible transfer to Floor tomorrow.    Per patient choice, referrals made to:  N/A

## 2023-05-30 NOTE — Progress Notes (Signed)
Received pt in bed, awake, aox2-3, keep falling asleep frequently, stated right leg and back pain, schedule tylenol and prn ibuprofen given with effect. Pt continue to have sweating,  intermittently anxious, agitated , CIWA +, stated " very hot" MD aware, clonidine given with some effect . Pt is  on 2 L 02, 02 saturation mid 90%, occasionally dry cough, lungs sound clear. Pt tolerated po intakes. Foley catheter inserted as order for urinary retention, draining clear yellow urine adequately.

## 2023-05-30 NOTE — Progress Notes (Signed)
Pt remains on NIV 08/28/29%. Tolerating well. No changes made this shift. Will continue to monitor.

## 2023-05-30 NOTE — Care Coordination (Incomplete)
ED/Inpatient Recovery Coach Note      Sherene identifies goals as Geophysicist/field seismologist with care team and community providers: {YES, NO, [id number]    Educate pt on available treatment options: {YES, NO, N/A:13937}    Educate pt on 24/7 crisis coverage: {YES, NO, N/A:13937}    Referral(s) made to ***      Outcome: {(select one):20515}    Time spent: {minutes:20516} minutes    Diagnosis (obtain from referral): {ICD10 UUVOZ:[id number]}      Summary  ***      Caryn Bee O'brien

## 2023-05-30 NOTE — Progress Notes (Signed)
Patient continues to refuse Bipap

## 2023-05-30 NOTE — Consults (Signed)
..  Reason for Consult? substance use disorder, need for home services    This SW completed an assessment of pt at bedside. Pt appeared to be A&Ox4, as evidenced by her capacity to logically respond to this SW's questions. Pt demonstrated a calm demeanor and was fully cooperative throughout this interview.     History: Pt is a 52 y.o. female admitted to the hospital on 05/28/23 with primary dx, ETOH withdrawal syndrome, uncomplicated. Pt's PMH includes: psychiatric disorders and PSUD.    Living Situation: Pt reports living alone in a split ranch house. Pt reports there are no steps and the house is completely wheelchair accessible. Pt reports that she was previously completely independent completing ADL's. Pt reports recent difficulty completing ADL's and IADL's, due to an injury. Pt requested home-making services on d/c.     Psych/Mental Health: Pt reported a hx of depression/anxiety that is effectively controlled with medication. Pt reports an intention to engage in outpatient mental health services offered by [facility].     Transportation: Pt reports receiving transportation to medical appointments through [facility].     Employment/Income: Pt reports having no financial issues as she receives SSDI.     Support System: Pt reports receiving no support from friends and family. Pt reports feeling supported by [facility].     Additional Providers/Home Services: Home Care Services: Yes  Type of Home Care Services: Homehealth aide;Meals on Wheels    Nutrition Concerns (Food Security): Pt reports no issues with food security. Pt reports receiving SNAP benefit and Meals on Wheels.     PCP: [name], MD    Medication compliance: Pt reports no issues with medication compliance.   Pharmacy:    Preferred Pharmacy:     Substance Abuse : Pt reports an extended period of sobriety (approximately 7 years) and a recent relapse. Pt reports her primary issue is with ETOH. Pt reports being prescribed Oxycodone for pain management. Pt reports attempting  to wean herself off of Oxycodone, but 'self medicated" with ETOH to reduce side effects. Pt reports that she is aware of the potential danger of quitting ETOH use on her own, but has quit 'cold [place]' before. Pt stated "no rehab' when this SW initially visited her room. This SW will f/u with [facility], but pt does not appear interested in substance abuse tx after d/c.                              Insurance:   Chief Strategy Officer as of 05/30/2023       [employer]      Plan: [employer] [facility] Group: ICO Member: [id number]   Effective from: 09/20/2019 Subscriber: Barth Kirks Subscriber ID: [id number]   Guarantor: Barth Kirks                  Social Work Intervention:   Motivational interviewing  Substance abuse counseling   Collaboration with providers / care coordination     Plan: 1) SW to f/u with pt's [facility] CM and verify services.    2) SW will continue liasing with [facility] and pt's tx team.    3) SW to f/u as needed.

## 2023-05-30 NOTE — Progress Notes (Signed)
[facility] Service Follow-up Note  Prior psychiatry consult notes as well as interim history reviewed in EPIC.   Interval Events:  Patient chart reviewed. Pt transferred to ICU during interval.  Transfer given respiratory depression and somnolence.  Patient no longer as somnolent but still retaining CO2 today.  Not cooperative with BiPAP.    Subjective:  During interview, patient is irritable and refuses to engage in extensive interview.  She does however converse with Clinical research associate briefly about initial HI.  She reports that that "was a while ago" when asked about her statements of HI.  She says that she no longer has any feelings of wanting to harm anybody at [facility] nor at [facility].  She reports that she is frustrated that she was put on opioids and she has been "trying to fight a battle I did not ask for".  She reports she wants to get off oxycodone.  She says she was taking up to 210 mg daily in August.  Otherwise she denies SI or AVH or any significant depression or anxiety during today's interview before refusing to answer any more questions.    OBJECTIVE  BP (!) 123/99   Pulse 90   Temp 97.4 F (36.3 C) (Temporal)   Resp 22   Ht 5\' 5"  (1.651 m)   Wt (!) 141.1 kg (311 lb 1.1 oz)   SpO2 97%   BMI 51.76 kg/m     Standing medications:    enoxaparin (LOVENOX) injection  70 mg Subcutaneous Q24H    DULoxetine  30 mg Oral Daily    polyethylene glycol  17 g Oral Daily    atorvastatin  80 mg Oral Nightly    budesonide-formoterol  2 puff Inhalation BID    bumetanide  4 mg Oral Daily    levothyroxine  25 mcg Oral DAILY    pantoprazole  40 mg Oral Daily    OXcarbazepine  150 mg Oral BID    spironolactone  100 mg Oral Daily    acetaminophen  1,000 mg Oral TID    levETIRAcetam  750 mg Oral BID       PRN Medications:   Current Facility-Administered Medications   Medication Dose Route Frequency Last Admin    sennosides  17.2 mg Oral BID PRN      miconazole nitrate   Topical Daily PRN Given at 05/30/23 1335     nicotine polacrilex  4 mg Oral Q2H PRN 4 mg at 05/30/23 1335    ibuprofen  400 mg Oral Q6H PRN 400 mg at 05/30/23 1547        Mental Status Exam:  Mental Status Exam   General Appearance: Disheveled;Dressed in hospital gown/johnny;Appears older than stated age  Behavior: Uncooperative;Difficult to engage  Level of Consciousness: Alert  Orientation Level: Unable to assess  Attention/Concentration: WNL  Mannerisms/Movements: No abnormal mannerisms/movements  Speech Quality and Rate: WNL  Speech Clarity: Clear  Speech Tone: Loud  Vocabulary/Fund of Knowledge: WNL  Memory: Grossly intact  Thought Process & Associations: Goal-directed;Linear  Dissociative Symptoms: None  Thought Content: No abnormalities reported or observed  Hallucinations: None  Suicidal Thoughts: None  Homicidal Thoughts: None  Mood: Irritable  Affect:  (Irritable)  Judgment: Poor  Insight: Poor      Impression:  Pt is a 52 year old wheelchair-bound female with past medical hx of CHF, seizure d/o, hypothyroidism, COPD, GERD, chronic pain, spinal stenosis  and past psychiatric diagnostic hx of MDD, borderline personality disorder, schizoaffective d/o, bipolar type, and  PTSD, alcohol and cocaine abuse admitted on 05/28/23 with HI, admitted medically for management of possible opioid and alcohol withdrawal symptoms. She was unarousable at time of interview yesterday and today is alert though quite irritable and unwilling to engage in full interview.  She does engage enough to clarify previous HI statements and she denies any homicidal intent or thoughts towards any staff at [facility] or [facility].  She also denies any SI or any AVH and no acute safety concerns are appreciated during today's interview.  Would continue to hold any sedating medications.  May continue duloxetine as this is not sedating and she has already been receiving this.  Will need to clarify her home regimen with patient's outpatient physician Dr.Ian Otilio Miu.  Patient is not  an acute threat to self or others at this time and not appropriate for inpatient psychiatric admission.  We will continue to follow while IP.    Diagnosis:    Alcohol withdrawal syndrome, uncomplicated ([name])    Cocaine use    Homicidal ideation    Somnolence    Obstructive sleep apnea syndrome    Acute respiratory failure with hypoxia and hypercapnia ([name])    Acute metabolic encephalopathy       Recommendations:  -Patient is not an acute threat to self others at this time and is not a candidate for inpatient psychiatric admission.  We will continue to follow while inpatient.  - Recommend holding any sedating medication on her list, including the mirtazapine, gabapentin, ativan and muscle relaxers etc  - Recommended labs:   - B12 (noted to be fairly low previously), folate, vitamin D, thyroid panel, rpr/syphilis screen  - Ensure repletion of electolytes, K? 3.5, Mg ? 2, PO4 ? 3  - Avoid deliriogenic medications as able (anticholinergics, sedatives etc)   - Sleep study as OP for presumed OSA if this has not already been done    Case discussed with supervising attending, Eden Lathe MD    Thank you for consulting [facility]    Delila Spence MD MPH  PGY5

## 2023-05-31 DIAGNOSIS — I5032 Chronic diastolic (congestive) heart failure: Secondary | ICD-10-CM | POA: Diagnosis not present

## 2023-05-31 DIAGNOSIS — E559 Vitamin D deficiency, unspecified: Secondary | ICD-10-CM | POA: Insufficient documentation

## 2023-05-31 DIAGNOSIS — F10930 Alcohol use, unspecified with withdrawal, uncomplicated: Secondary | ICD-10-CM | POA: Diagnosis not present

## 2023-05-31 DIAGNOSIS — F1490 Cocaine use, unspecified, uncomplicated: Secondary | ICD-10-CM | POA: Diagnosis not present

## 2023-05-31 DIAGNOSIS — E538 Deficiency of other specified B group vitamins: Secondary | ICD-10-CM | POA: Insufficient documentation

## 2023-05-31 DIAGNOSIS — R45850 Homicidal ideations: Secondary | ICD-10-CM | POA: Diagnosis not present

## 2023-05-31 LAB — VENOUS BLOOD GAS
VBG POTASSIUM: 3.6 mmol/L (ref 3.5–5.1)
VEN PERCENT OXYGEN SATURATION: 98.8 %
VENOUS BASE EXCESS: 6.4 mmol/L — ABNORMAL HIGH (ref 2–3)
VENOUS BICARBONATE: 32.5 — ABNORMAL HIGH (ref 23–28)
VENOUS FIO2: 21
VENOUS PARTIAL CARBON DIOXIDE: 52 mmHg — ABNORMAL HIGH (ref 41.0–51.0)
VENOUS PARTIAL PRESSURE OXYGEN: 101 mmHG
VENOUS PATIENTS TEMP: 98.6
VENOUS pH: 7.41 (ref 7.31–7.41)

## 2023-05-31 LAB — PHOSPHORUS MAGNESIUM
MAGNESIUM: 1.8 mg/dL (ref 1.6–2.6)
PHOSPHORUS: 2.6 mg/dL (ref 2.5–4.9)

## 2023-05-31 LAB — CBC WITH PLATELET
ABSOLUTE NRBC COUNT: 0 10*3/uL (ref 0.0–0.0)
HEMATOCRIT: 41.2 % (ref 34.1–44.9)
HEMOGLOBIN: 13.4 g/dL (ref 11.2–15.7)
MEAN CORP HGB CONC: 32.5 g/dL (ref 31.0–37.0)
MEAN CORPUSCULAR HGB: 31.2 pg (ref 26.0–34.0)
MEAN CORPUSCULAR VOL: 95.8 fl (ref 80.0–100.0)
MEAN PLATELET VOLUME: 12.4 fL (ref 8.7–12.5)
NRBC %: 0 % (ref 0.0–0.0)
PLATELET COUNT: 230 10*3/uL (ref 150–400)
RBC DISTRIBUTION WIDTH STD DEV: 46.8 fL — ABNORMAL HIGH (ref 35.1–46.3)
RED BLOOD CELL COUNT: 4.3 M/uL (ref 3.90–5.20)
WHITE BLOOD CELL COUNT: 5.7 10*3/uL (ref 4.0–11.0)

## 2023-05-31 LAB — VITAMIN D,25 HYDROXY: VITAMIN D,25 HYDROXY: 18 ng/mL — ABNORMAL LOW (ref 30.0–100.0)

## 2023-05-31 LAB — FOLATE: FOLATE: 8.8 ng/mL (ref 4.6–?)

## 2023-05-31 LAB — BASIC METABOLIC PANEL
ANION GAP: 8 mmol/L — ABNORMAL LOW (ref 10–22)
BUN (UREA NITROGEN): 15 mg/dL (ref 7–18)
CALCIUM: 9 mg/dL (ref 8.5–10.5)
CARBON DIOXIDE: 31 mmol/L (ref 21–32)
CHLORIDE: 99 mmol/L (ref 98–107)
CREATININE: 0.8 mg/dL (ref 0.4–1.2)
ESTIMATED GLOMERULAR FILT RATE: >60 mL/min (ref >60)
Glucose Random: 96 mg/dL (ref 74–160)
POTASSIUM: 4 mmol/L (ref 3.5–5.1)
SODIUM: 138 mmol/L (ref 136–145)

## 2023-05-31 LAB — THYROID SCREEN TSH REFLEX FT4: THYROID SCREEN TSH REFLEX FT4: 2.11 u[IU]/mL (ref 0.270–4.200)

## 2023-05-31 LAB — VITAMIN B12: VITAMIN B12: 261 pg/mL (ref 232–1245)

## 2023-05-31 MED ORDER — VITAMIN B-12 1000 MCG PO TABS
1000.0000 ug | ORAL_TABLET | Freq: Every day | ORAL | Status: DC
Start: 2023-05-31 — End: 2023-05-31
  Administered 2023-05-31: 1000 ug via ORAL
  Filled 2023-05-31: qty 1

## 2023-05-31 MED ORDER — BUPRENORPHINE HCL-NALOXONE HCL 8-2 MG SL SUBL
8.0000 mg | SUBLINGUAL_TABLET | Freq: Three times a day (TID) | SUBLINGUAL | Status: DC
Start: 2023-05-31 — End: 2023-06-02
  Administered 2023-05-31 – 2023-06-02 (×7): 1 via SUBLINGUAL
  Filled 2023-05-31 (×7): qty 1

## 2023-05-31 NOTE — Progress Notes (Signed)
[facility] Service Follow-up Note  Prior psychiatry consult notes as well as interim history reviewed in EPIC.   Interval Events:  Patient chart reviewed. Pt transferred back to floors during interval.    Subjective:  Pt seen during rounds. She reports feeling better today and is notably less irritable. She denies any SI HI or AVH. She reports she is thankful to not be feeling the previous withdrawal sx she had been experiencing previously. She reporst she would like to go home. Denies any significant depressive or anxious sx. Writer inquires about her medication regimen, she reports that she would like to continue with present regimen of just duloxetine for the time being and does not want to resume buspirone.     Later Clinical research associate also speaks with PCP Dr Otilio Miu as well as with primary team. Call placed to NP NDAH,CHIZOBA JOY  as well from [facility] in [place]    OBJECTIVE  BP 112/78   Pulse 95   Temp 99.1 F (37.3 C) (Oral)   Resp 18   Ht 5\' 5"  (1.651 m)   Wt (!) 141.1 kg (311 lb 1.1 oz)   SpO2 91%   BMI 51.76 kg/m     Standing medications:    buprenorphine-naloxone  8 mg Sublingual TID    enoxaparin (LOVENOX) injection  70 mg Subcutaneous Q24H    DULoxetine  30 mg Oral Daily    polyethylene glycol  17 g Oral Daily    atorvastatin  80 mg Oral Nightly    budesonide-formoterol  2 puff Inhalation BID    bumetanide  4 mg Oral Daily    levothyroxine  25 mcg Oral DAILY    pantoprazole  40 mg Oral Daily    OXcarbazepine  150 mg Oral BID    spironolactone  100 mg Oral Daily    acetaminophen  1,000 mg Oral TID    levETIRAcetam  750 mg Oral BID       PRN Medications:   Current Facility-Administered Medications   Medication Dose Route Frequency Last Admin    sennosides  17.2 mg Oral BID PRN      miconazole nitrate   Topical Daily PRN Given at 05/30/23 1335    nicotine polacrilex  4 mg Oral Q2H PRN 4 mg at 05/31/23 1221    ibuprofen  400 mg Oral Q6H PRN 400 mg at 05/30/23 1547        Mental Status  Exam:  Mental Status Exam   General Appearance: Disheveled;Dressed in hospital gown/johnny;Appears older than stated age  Behavior: Uncooperative;Difficult to engage  Level of Consciousness: Alert  Orientation Level: Disoriented to situation  Attention/Concentration: WNL  Mannerisms/Movements: No abnormal mannerisms/movements  Speech Quality and Rate: WNL  Speech Clarity: Clear  Speech Tone: Loud  Vocabulary/Fund of Knowledge: WNL  Memory: Grossly intact  Thought Process & Associations: Goal-directed;Linear  Dissociative Symptoms: None  Thought Content: No abnormalities reported or observed  Hallucinations: None  Suicidal Thoughts: None  Homicidal Thoughts: None  Mood: Irritable  Affect:  (Irritable)  Judgment: Poor  Insight: Poor      Impression:  Pt is a 52 year old wheelchair-bound female with past medical hx of CHF, seizure d/o, hypothyroidism, COPD, GERD, chronic pain, spinal stenosis  and past psychiatric diagnostic hx of MDD, borderline personality disorder, schizoaffective d/o, bipolar type, and PTSD, alcohol and cocaine abuse admitted on 05/28/23 with HI, admitted medically for management of possible opioid and alcohol withdrawal symptoms. She was unarousable at time of initial interview  bbut has been more alert since. Consulted by primary team regarding HI and her medication regimen. She does engage enough to clarify previous HI statements and she denies any homicidal intent or thoughts towards any staff at [facility] or [facility].  She also denies any SI or any AVH and no acute safety concerns are appreciated.  Would continue to hold any sedating medications.  May continue duloxetine as this is not sedating and she has already been receiving this. She reports today she would like to keep her medication regimen simplified and declines resuming buspirone. Patient is not an acute threat to self or others at this time and not appropriate for inpatient psychiatric admission.  We will continue to follow  while IP.    Diagnosis:    Alcohol withdrawal syndrome, uncomplicated ([name])    Cocaine use    Homicidal ideation    Somnolence    Obstructive sleep apnea syndrome    Acute respiratory failure with hypoxia and hypercapnia ([name])    Acute metabolic encephalopathy       Recommendations:  -Patient is not an acute threat to self others at this time and is not a candidate for inpatient psychiatric admission.  We will continue to follow while inpatient.  - Recommend holding any sedating medication on her list, including the mirtazapine, gabapentin, ativan and muscle relaxers etc. She declines resuming buspirone  - replete vitamin D and B12   - Ensure repletion of electolytes, K? 3.5, Mg ? 2, PO4 ? 3  - Avoid deliriogenic medications as able (anticholinergics, sedatives etc)   - Sleep study as OP for presumed OSA if this has not already been done    Case discussed with supervising attending, Eden Lathe MD    Thank you for consulting [facility]    Delila Spence MD MPH  [id number]

## 2023-05-31 NOTE — Progress Notes (Addendum)
PROGRESS NOTE  05/31/2023    PATIENT INFO: Cheryl Zimmerman, [age] year old female  DATE OF ADMISSION: 05/28/23    ROOM/BED LOCATION:  430/430-A  HOSPITAL DAY: 3    REVIEW OF OVERNIGHT EVENTS:  Transfer from ICU.  Patient now off Narcan drip.  Refusing BiPAP for 24 hours and back to home oxygen requirement    SUBJECTIVE:  Denies shortness of breath, nausea, vomiting, palpitations, tingling, fatigue, diarrhea, chest pain, orthopnea.  Patient states that she did cocaine before presenting to hospital due to being given oxycodone for her right ankle fracture when if she stated that she does not do well on oxycodone and had to stop herself cold [place] precipitating withdrawal symptoms.  Patient also stopped Suboxone due to being given oxycodone.  She states that the Suboxone has helped but.  She states that she is on Suboxone for pain relief more so than opioid use disorder treatment.    OBJECTIVE:    VITAL SIGNS      05/31/23  0720 05/31/23  1221 05/31/23  1338 05/31/23  1340   BP: 124/72  (!) 140/91 112/78   Pulse: 83  93 95   Resp:  18 18    Temp:   99.1 F (37.3 C)    TempSrc:   Oral    SpO2: 94%  90% 91%   Weight:       Height:           INS/OUTS (PAST 24 HOURS)  I/O 24 Hrs:  In: 1500 [P.O.:1500]  Out: 1230 [Urine:1230]    PHYSICAL EXAM:  Gen: NAD.  CV: RRR. No m/r/g.  Lungs: CTAB. Normal WOB.  Abd: Soft, normal bowel sounds, NTND.  Ext: No edema.  Neuro: A&Ox4. Moving all extremities spontaneously    T/L/D:   Peripheral IV 05/28/23 Anterior;Left Hand (Active)   Site Assessment Clean;Dry;Intact 05/30/23 2100   Line Status Flushed;Saline locked 05/30/23 2100   Dressing Status Clean;Dry;Intact 05/30/23 1600   Dressing Change Due (If No then N/A) 06/05/23 05/29/23 2000   Number of days: 2       Peripheral IV 05/30/23 Anterior;Right Mid forearm (Active)   Site Assessment Clean;Dry;Intact 05/30/23 2100   Line Status Flushed;Saline locked 05/30/23 2100   Dressing Status Clean;Dry;Intact 05/30/23 1600    Dressing Intervention New dressing 05/30/23 1559   Dressing Change Due (If No then N/A) 06/06/23 05/30/23 1559   Number of days: 0       Indwelling Bladder Catheter Double-lumen 14 Fr. (Active)   Is Foley ready for removal? No 05/31/23 0324   Indication/necessity Urinary Retention or Bladder Outlet Obstruction after  void trial 05/31/23 0324   Inserted this admission? Yes 05/31/23 0324   Site Assessment Clean;Intact 05/31/23 0324   Bag management performed? Yes 05/31/23 0324   Foley care performed?  Yes 05/31/23 0034   CHG Wipe used?  Yes 05/31/23 0034   Collection Container Urimeter bag 05/31/23 0324   Securement Method Securing device (Describe) 05/31/23 0324   Output (mL) 75 mL 05/31/23 0215   Number of days: 0       RECENT LABS:  Chemistries:  Recent Labs     05/29/23  0728 05/30/23  0436 05/31/23  0710   NA 138 142 138   K 4.1 4.6 4.0   CO2 26 27 31    BUN 10 13 15    CREAT 0.9 0.9 0.8   CA 9.2 9.2 9.0   MG  --  1.9 1.8   PHOS  --  3.4 2.6   ANION 13 12 8*   GFR > 60 > 60 > 60    GI:  Recent Labs     05/29/23  0728   AST 28   ALT 56*   TBILI 0.4   DBILI < 0.2   IBIL TNP   ALKPHOS 194*   ALBUMIN 4.3      CBC:  Recent Labs     05/29/23  0728 05/30/23  0436 05/31/23  0710   WBC 7.8 7.8 5.7   HGB 15.4 14.7 13.4   HCT 46.0* 44.8 41.2   PLTA 289 252 230    Coags:   No results for input(s): "INR", "PT", "APTT", "HPTT", "FIB" in the last 72 hours.   Trops, BNP, D-dimer, Lactate, C-RP:  No results for input(s): "TROPTHS", "TROPTHSDELTA", "PROBNP", "DDPEDVT", "DD", "LACTICACID", "CRP" in the last 72 hours.      Finger Sticks:  Recent Labs     05/29/23  0349   FINGERSTICKR 132    Urinalysis:  No results for input(s): "UACOL", "UACLA", "UAGLU", "UABIL", "UAKET", "SPEGRAVURINE", "UAOCC", "UAPH", "UAPRO", "UABAC", "UARBC", "UAWBC", "UANIT", "LEUKOCYTES", "UAMIC", "UASQE" in the last 72 hours.    Invalid input(s): "UAOBU"       Other important labs include     MICROBIOLOGY REVIEW  No new microbiology results to  review.    IMAGING AND OTHER STUDIES  No new imaging or other studies to review.      MEDICATIONS  See list in EPIC    ASSESSMENT & PLAN   Dniyah Berdugo is a 52 year old English-speaking female with complex pulmonary history (COPD (2L NC), OSA not on CPAP, HFpEF, morbid obesity) and complex psych history, polysubstance use disorder with recent relapse presented with suspected alcohol and opioid withdrawal, transferred to the ED with sedation 2/2 hypercarbia from polypharmacy, obesity hypoventilation, COPD and sleep apnea syndrome.  She was treated with bipap and a Narcan drip with improvement in her hypercarbia. While in the ICU she was seen by psych for homocidal ideation. Overnight, she was not using bipap and was in fact on room air with improved mental status so transfer to medical ward was requested.     #Excessive sedation  #Suspected obesity hypoventilation syndrome along with obstructive sleep apnea   #Obesity hypoventilation syndrome  #Acute hypercapnic respiratory failure  #Polysubstance abuse and overdose contributing   Sedation seemed out of proportion to medications given. Neurological examination nonfocal and noncontributory. CT brain obtained to exclude remote possibility of stroke and was normal. Based on exam decided trial of Narcan initially 0.4 and then 0.8 to which she responded partially. In view of this it was decided to start her on a Narcan drip with a view to target respiratory rate of 12.  Given her probable undiagnosed OSA and acute hypercapnic respiratory failure, it appears reasonable to trial her on BiPAP 12/10 with serial gases as needed. She responded to Narcan and was placed on Narcan drip.  The drip was tapered off after her mentation improved. pH is 7.32 and CO2 is 60 on a VBG.  Likely within normal limit pH on ABG. She is refusing BiPAP therapy.  Chest x-ray shows bibasilar atelectasis, mild. Repeat VBG in a.m. to trend CO2 and pH. pH:7.41, PCO2: 52, PO2:101, HCO3: 32.5.  -BiPAP  at night 14/7 and during the day 4 hours on 4 hours off.  She is not agreeable.  -Oxygen to maintain sat between 88 and 92%  -Recommend holding any sedating medication on  her list, including the mirtazapine, melatonin, Prazosin, gabapentin, ativan and muscle relaxers such as baclofen etc  -Recommended labs ordered: B12 , folate, vitamin D, thyroid panel, rpr/syphilis screen  -Vitamin B12 repleted  -Ensure repletion of electolytes, K? 3.5, Mg ? 2, PO4 ? 3  -Avoid deliriogenic medications as able (anticholinergics, sedatives etc)   -Sleep study as OP for presumed OSA if this has not already been done  -Consider VBG tomorrow to assess for respiratory/CO2 status.  -Continue Symbicort inhaler     #Polysubstance use disorder  #Opiate use   # Cocaine/crack use disorder  #Alcohol withdrawal (resolved)  Suboxone 8mg  TID (outpatient pain regimen) should provide adequate management of opiate withdrawal. High risk. History of complicated withdrawal. Patient states that she took half a gallon of alcohol prior to admission. No clear physiologic signs of alcohol withdrawal -- no diarrhea, sweating, abdominal cramping, nausea, vomiting, myalgias, yawning, shivering.  -Ativan discontinued with CIWA scoring due to patient being oversedated in this hospitalization  -Recovery coach referral  -Continue Keppra 750 mg twice daily and Trileptal 150 mg bid for known seizures  -Nicotine gum as needed    #PTSD/ Depression  Patient is currently between psychiatrists and possibly overmedicated with psychiatric medications. Denied inpatient psychiatric care. -Patient is not an acute threat to self others at this time and is not a candidate for inpatient psychiatric admission.  We will continue to follow while inpatient.  -Consulted psychiatry for evaluation of her mental health and her prescribed regimen  -Continue duloxetine DR 30 mg daily  -Holding all sedative medications  -With consideration for outpatient psychiatry follow-up. Connecting  with Kyra Leyland for outpatient provider.  -Coordinating with PCP Dr. [name] for medication regimen titration for discharge planning.    #Urinary Retention  The patient did have urine retention and a straight cath was used to drain 1 L on arrival to ICU.  Indwelling bladder catheter in place.  Likely due to medication.  Will continue to monitor    #Right ankle fracture with right foot in cast   -Continue nonweightbearing left great  -Pain well managed and tolerated.  -Will continue following outpatient with orthopedics.     #Acute on chronic pain  -Continue standing Tylenol and prn Motrin  -Holding home gabapentin and baclofen due to sedation  -Continue Suboxone 8mg  TID      #Skin lesion  Suspect fungal infection.  Apply miconazole nitrate ointment on rash in sacrum, abdominal folds, and skin under the breast.  -Wound eval and treat consult placed     #Home Meds  HLD: Continue Lipitor 80 mg nightly  COPD: Continue Symbicort twice daily  HFpEF: Continue Bumex 4 mg daily and spironolactone 100 mg daily. 04/08/2022 LVEF 65%  Hypothyroidism: Continue Synthroid 25 mcg every morning before breakfast    #Discharge planning  Patient lives alone in a wheelchair accessible apartment, but feels overwhelmed and believes that she needs additional support at home.  She is struggling to manage her non-weight bearing status alone and requests VNA and home PT and (perhaps) additional personal care. She is not interested in short term rehab     # Tele: Yes  # Foley: Yes for urinary retention  # Diet: Regular  Protonix 40 mg daily  # DVT ppx: switch to enoxaparin 70 mg subq daily  # Bowel regimen: Senna PRN, MiraLAX  # Language: English   # Dispo: ICU given  Medication Reconciliation: Done    Code Status: Full Code  Health care proxy: Data Unavailable  Data Unavailable      Discussed with attending, Traci Sermon, MD    Dominga Ferry 05/31/2023

## 2023-05-31 NOTE — Progress Notes (Signed)
VBG is done this morning:   Ph:7.41, PCO2: 52, PO2:101, HCO3: 32.5. MD notified
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
## 2023-05-31 NOTE — RN Shift Note (Signed)
ICU transfer. A&Ox2-3. VSS denies chest pain/sob. Cont on 2L. Tele SR. Foley in place w/ clear yellow urine. Given PO nicorette prn for nicotine craving w/ good effect. Bed alarm on. Call light in reach. Safety maintained.

## 2023-05-31 NOTE — RN Shift Note (Addendum)
Pt is AOX2 calm/cooperative.Denied pain/discomfort @ this time. No SOB/respiratory distress observed. Cont on tele with NS/RA.Cont on suboxone with good effect.     Refused to keep the tele on, teaching given with poor effect. MD is notified.

## 2023-05-31 NOTE — Miscellaneous (Signed)
ICU to medicine team brief accept note:    I reviewed hospital course - briefly, 66F w complex pulmonary history (COPD (2L NC), OSA not on CPAP, HFpEF, morbid obesity) and complex psych history, polysubstance use disorder with recent relapse presented with suspected alcohol and opioid withdrawal, transferred to the ED with sedation 2/2 hypercarbia from polypharmacy, obesity hypoventilation, COPD and sleep apnea syndrome.  She was treated with bipap and a Narcan drip with improvement in her hypercarbia. While in the ICU she was seen by psych for homocidal ideation. Overnight, she was not using bipap and was in fact on room air with improved mental status so transfer to medical ward was requested.    On my interview, she was frustrated at being woken up, "I want to get out of here". She was oriented to person, hospital and 2024.  She declined further interview or exam.    BP 134/83   Pulse 85   Temp 96.7 F (35.9 C) (Temporal)   Resp 17   Ht 5\' 5"  (1.651 m)   Wt (!) 141.1 kg (311 lb 1.1 oz)   SpO2 97%   BMI 51.76 kg/m     Most recent VBG (9/9) 7.32/60  BMP 9/10AM wnl  CBC 9/10AM wnl    A/P per ICU transfer note, in addition:   # urinary retention with foley in place: foley placed 9/10 for urinary retention x2 (order was by ICU resident to nursing states this was per urology and should remain in place but I cannot find a note from urology, so may need an AM discussion with ICU about the plan)  - need to d/c foley cath with trial of void as soon as clarified    # hypercarbia: improved.  - I ordered AM VBG per ICU rec  - cont pulse ox and telemetry  - cont bumex    # polysubstance use disorder with withdrawal:  - per ICU, no longer requiring CIWA (I dc'ed the order)  - suboxone still held, needs to be restarted (possibly in AM pending pt mental status)  - coordination with psych    This is not a billable encounter.

## 2023-05-31 NOTE — Plan of Care (Signed)
Drowsy on and off this shift,afebrile.  Foley discontinued this am,pt voiding okay no issue.  Able to use the bathroom independently.  Awaiting eval from psychiatry on medication adjustment.    Problem: Safety  Goal: Free from accidental physical injury  Outcome: Progressing     Problem: Daily Care  Goal: Daily care needs are met  Description: Assess and monitor ability to perform self care and identify potential discharge needs.  Outcome: Progressing     Problem: Pain  Goal: Patient's pain/discomfort is manageable  Description: Assess and monitor patient's pain using appropriate pain scale. Collaborate with interdisciplinary team and initiate plan and interventions as ordered. Re-assess patient's pain level 30 - 60 minutes after pain management intervention.   Outcome: Progressing     Problem: Compromised Skin Integrity  Description: Use this problem when pressure ulcers are classified as stage I or II.  Goal: Skin integrity is maintained or improved  Description: Assess and monitor skin integrity. Identify patients at risk for skin breakdown on admission and per policy. Collaborate with interdisciplinary team and initiate plans and interventions as needed.  Outcome: Progressing     Problem: Alcohol Withdrawal  Goal: Patient will demonstrate knowledge of signs/symptoms  Description: Patient will demonstrate knowledge of signs/symptoms of alcohol withdrawal, treatment options, and available community resources.  Outcome: Progressing  Goal: Patient will maintain physiological stability  Outcome: Progressing     Problem: Knowledge Deficit  Goal: Patient/S.O. demonstrates understanding of disease process, treatment plan, medications, and discharge instructions.  Description: Complete learning assessment and assess knowledge base  Outcome: Progressing     Problem: Hemodynamic Status  Goal: Patient has stable vital signs and fluid balance  Description: Assess and monitor patient's heart rate, rhythm, respiratory rate,  peripheral pulses, capillary refill, color, body temperature, intake and output, labs and physical activity tolerance.   Observe for signs of chest pain (note location, duration, severity, radiation and associated symptoms such as diaphoresis, nausea, indigestion).  Monitor for signs and symptoms of heart failure (eg. shortness of breath, edema of feet/ankles/legs, rapid irregular heart rate, coughing, wheezing, white/pink blood tinged sputum, sudden weight gain, chest pain). Collaborate with interdisciplinary team and initiate plan and interventions as ordered.  Outcome: Progressing     Problem: Inadequate Airway Clearance  Goal: Patient will maintain patent airway  Description: Assess and monitor breath sounds, cough and sputum (if present), and intake/output. Collaborate with respiratory therapy to administer medications and treatments.   Outcome: Progressing  Goal: Patient will achieve/maintain normal respiratory rate/effort  Description: Respiratory rate and effort will be within normal limits for the patient  Outcome: Progressing     Problem: Inadequate Gas Exchange  Goal: Patient is adequately oxygenated and ventilation is improved  Description: Assess and monitor vital signs, oxygen saturation, respiratory status to include rate, depth, effort, and lung sounds, mental status, cyanosis, and labs (ABG's).  Monitor effects of medications that may sedate the patient.  Collaborate with respiratory therapy to administer medications and treatments.  Outcome: Progressing  Goal: Nutritional status is improving  Description: Monitor and assess patient for malnutrition (ex- brittle hair, bruises, dry skin, pale skin and conjunctiva, muscle wasting, smooth red tongue, and disorientation). Collaborate with interdisciplinary team and initiate plan and interventions as ordered.  Monitor patient's weight and dietary intake as ordered or per policy. Utilize nutrition screening tool and intervene per policy. Determine  patient's food preferences and provide high-protein, high-caloric foods as appropriate.   Outcome: Progressing     Problem: Activity:  Goal: Mobility will  be supported throughout the hospitalization,  Outcome: Progressing     Problem: Cognitive:  Goal: Caregivers and patients knowledge of risk factors and measures for prevention of condition will be supported throughout the hospitalization  Outcome: Progressing     Problem: Safety:  Goal: Will remain free from falls throughout the hospitalization,  Outcome: Progressing

## 2023-06-01 DIAGNOSIS — R45850 Homicidal ideations: Secondary | ICD-10-CM | POA: Diagnosis not present

## 2023-06-01 DIAGNOSIS — F10930 Alcohol use, unspecified with withdrawal, uncomplicated: Secondary | ICD-10-CM | POA: Diagnosis not present

## 2023-06-01 DIAGNOSIS — F1490 Cocaine use, unspecified, uncomplicated: Secondary | ICD-10-CM | POA: Diagnosis not present

## 2023-06-01 DIAGNOSIS — I5032 Chronic diastolic (congestive) heart failure: Secondary | ICD-10-CM | POA: Diagnosis not present

## 2023-06-01 MED ORDER — ONDANSETRON 4 MG PO TBDP
4.0000 mg | ORAL_TABLET | Freq: Four times a day (QID) | ORAL | Status: DC | PRN
Start: 2023-06-01 — End: 2023-06-02

## 2023-06-01 MED ORDER — ONDANSETRON 4 MG PO TBDP
4.00 mg | ORAL_TABLET | Freq: Once | ORAL | Status: AC
Start: 2023-06-01 — End: 2023-06-01
  Administered 2023-06-01: 4 mg via ORAL
  Filled 2023-06-01: qty 1

## 2023-06-01 MED ORDER — MELATONIN 3 MG PO TABS
6.0000 mg | ORAL_TABLET | Freq: Every evening | ORAL | Status: DC
Start: 2023-06-01 — End: 2023-06-02
  Administered 2023-06-01: 6 mg via ORAL
  Filled 2023-06-01: qty 2

## 2023-06-01 MED ORDER — VITAMIN D 25 MCG (1000 UT) PO TABS
1000.0000 [IU] | ORAL_TABLET | Freq: Every day | ORAL | Status: DC
Start: 2023-06-01 — End: 2023-06-02
  Administered 2023-06-01 – 2023-06-02 (×2): 1000 [IU] via ORAL
  Filled 2023-06-01 (×2): qty 1

## 2023-06-01 MED ORDER — SENNOSIDES 8.6 MG PO TABS
17.20 mg | ORAL_TABLET | Freq: Once | ORAL | Status: AC
Start: 2023-06-01 — End: 2023-06-01
  Administered 2023-06-01: 17.2 mg via ORAL
  Filled 2023-06-01: qty 2
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
## 2023-06-01 NOTE — Plan of Care (Signed)
Problem: Safety  Goal: Free from accidental physical injury  Outcome: Progressing     Problem: Daily Care  Goal: Daily care needs are met  Description: Assess and monitor ability to perform self care and identify potential discharge needs.  Outcome: Progressing     Problem: Pain  Goal: Patient's pain/discomfort is manageable  Description: Assess and monitor patient's pain using appropriate pain scale. Collaborate with interdisciplinary team and initiate plan and interventions as ordered. Re-assess patient's pain level 30 - 60 minutes after pain management intervention.   Outcome: Progressing     Problem: Compromised Skin Integrity  Description: Use this problem when pressure ulcers are classified as stage I or II.  Goal: Skin integrity is maintained or improved  Description: Assess and monitor skin integrity. Identify patients at risk for skin breakdown on admission and per policy. Collaborate with interdisciplinary team and initiate plans and interventions as needed.  Outcome: Progressing     Problem: Alcohol Withdrawal  Goal: Patient will demonstrate knowledge of signs/symptoms  Description: Patient will demonstrate knowledge of signs/symptoms of alcohol withdrawal, treatment options, and available community resources.  Outcome: Progressing  Goal: Patient will maintain physiological stability  Outcome: Progressing     Problem: Knowledge Deficit  Goal: Patient/S.O. demonstrates understanding of disease process, treatment plan, medications, and discharge instructions.  Description: Complete learning assessment and assess knowledge base  Outcome: Progressing     Problem: Hemodynamic Status  Goal: Patient has stable vital signs and fluid balance  Description: Assess and monitor patient's heart rate, rhythm, respiratory rate, peripheral pulses, capillary refill, color, body temperature, intake and output, labs and physical activity tolerance.   Observe for signs of chest pain (note location, duration, severity,  radiation and associated symptoms such as diaphoresis, nausea, indigestion).  Monitor for signs and symptoms of heart failure (eg. shortness of breath, edema of feet/ankles/legs, rapid irregular heart rate, coughing, wheezing, white/pink blood tinged sputum, sudden weight gain, chest pain). Collaborate with interdisciplinary team and initiate plan and interventions as ordered.  Outcome: Progressing     Problem: Inadequate Airway Clearance  Goal: Patient will maintain patent airway  Description: Assess and monitor breath sounds, cough and sputum (if present), and intake/output. Collaborate with respiratory therapy to administer medications and treatments.   Outcome: Progressing  Goal: Patient will achieve/maintain normal respiratory rate/effort  Description: Respiratory rate and effort will be within normal limits for the patient  Outcome: Progressing     Problem: Inadequate Gas Exchange  Goal: Patient is adequately oxygenated and ventilation is improved  Description: Assess and monitor vital signs, oxygen saturation, respiratory status to include rate, depth, effort, and lung sounds, mental status, cyanosis, and labs (ABG's).  Monitor effects of medications that may sedate the patient.  Collaborate with respiratory therapy to administer medications and treatments.  Outcome: Progressing  Goal: Nutritional status is improving  Description: Monitor and assess patient for malnutrition (ex- brittle hair, bruises, dry skin, pale skin and conjunctiva, muscle wasting, smooth red tongue, and disorientation). Collaborate with interdisciplinary team and initiate plan and interventions as ordered.  Monitor patient's weight and dietary intake as ordered or per policy. Utilize nutrition screening tool and intervene per policy. Determine patient's food preferences and provide high-protein, high-caloric foods as appropriate.   Outcome: Progressing     Problem: Activity:  Goal: Mobility will be supported throughout the  hospitalization,  Outcome: Progressing     Problem: Cognitive:  Goal: Caregivers and patients knowledge of risk factors and measures for prevention of condition will be supported throughout  the hospitalization  Outcome: Progressing     Problem: Safety:  Goal: Will remain free from falls throughout the hospitalization,  Outcome: Progressing

## 2023-06-01 NOTE — Care Coordination (Signed)
CASE MANAGER REASSESSMENT NOTE    Case Reviewed with: MDR attendants     Level of Care:  Inpatient      Next review date if insurance: Daily    Change in status from initial assessment: No    New information that will affect transition plan: N/a    Anticipated Discharge plan: ? Unable to tolerate PO. Due to void after foley removal. Refer to [facility] for med mgmt.

## 2023-06-01 NOTE — Plan of Care (Addendum)
Pt A&O x 3, calm/co-operative/ drowsy easily arousable, afebrile and at RA. Denies CP, SOB and dizziness. Endorse generalized pain,medicine given as per MAR. C/O nausea at mrng, PRN Zofran given with little effect. On cont pulse oxymetry. Medicine given as per [facility]. Continent to B&B. OOB for BR. No BM today.   At 17:30 hrs, denies N&V, completed her dinner 100%, good appetite.    Call light at pt reach. Bed locked at low position and safety maintained.

## 2023-06-01 NOTE — Consults (Signed)
Wound Consult    Patient seen at bedside this morning for consult. Primary RN and other staff at the bedside assisting patient. Patient is agitated and refused skin assessment of any kind despite encouragement.    If the patient become amenable or there are ongoing skin care concerns please re-consult.     Judene Companion RN BSN [facility]  Wound Treatment Associate   [facility]

## 2023-06-01 NOTE — Progress Notes (Signed)
PROGRESS NOTE  06/01/2023    PATIENT INFO: Cheryl Zimmerman, [age] year old female  DATE OF ADMISSION: 05/28/23    ROOM/BED LOCATION:  430/430-A  HOSPITAL DAY: 4    REVIEW OF OVERNIGHT EVENTS:  Cont on tele with NS. Cont on suboxone with good effect.     SUBJECTIVE:  Tried to eat breakfast when I walked in and was feeling extremely nauseas. She states that she would like a day more as she lives alone and does not feel safe to go home. She has not had a BM since she had those episodes of diarrhea on admission but believes its because she has had nothing to eat. Calm/cooperative. Denied pain/discomfort @ this time. No SOB/respiratory distress. Did not realize she should be using her fungal ointment on the rashes between her fold, she will do it today.     OBJECTIVE:    VITAL SIGNS      06/01/23  0134 06/01/23  0517 06/01/23  0846 06/01/23  0847   BP: 118/83 127/87 125/87    Pulse: 82 94 92    Resp: 18 18  18    Temp: 98.6 F (37 C) 98.2 F (36.8 C) 97.7 F (36.5 C)    TempSrc: Oral Oral Oral    SpO2: 91% 91% 92%    Weight:       Height:           INS/OUTS (PAST 24 HOURS)  I/O 24 Hrs:  In: 620 [P.O.:620]  Out: -     PHYSICAL EXAM:  Gen: NAD. A lot more awake this AM  CV: RRR. No m/r/g.  Lungs: CTAB. Normal WOB.  Abd: Soft, normal bowel sounds, NTND.  Ext: No edema.  Neuro: A&Ox4. Moving all extremities spontaneously    T/L/D:   Peripheral IV 05/28/23 Anterior;Left Hand (Active)   Site Assessment Clean;Dry;Intact 05/30/23 2100   Line Status Flushed;Saline locked 05/30/23 2100   Dressing Status Clean;Dry;Intact 05/30/23 1600   Dressing Change Due (If No then N/A) 06/05/23 05/29/23 2000   Number of days: 2       Peripheral IV 05/30/23 Anterior;Right Mid forearm (Active)   Site Assessment Clean;Dry;Intact 05/30/23 2100   Line Status Flushed;Saline locked 05/30/23 2100   Dressing Status Clean;Dry;Intact 05/30/23 1600   Dressing Intervention New dressing 05/30/23 1559   Dressing Change Due (If No then N/A)  06/06/23 05/30/23 1559   Number of days: 0       Indwelling Bladder Catheter Double-lumen 14 Fr. (Active)   Is Foley ready for removal? No 05/31/23 0324   Indication/necessity Urinary Retention or Bladder Outlet Obstruction after  void trial 05/31/23 0324   Inserted this admission? Yes 05/31/23 0324   Site Assessment Clean;Intact 05/31/23 0324   Bag management performed? Yes 05/31/23 0324   Foley care performed?  Yes 05/31/23 0034   CHG Wipe used?  Yes 05/31/23 0034   Collection Container Urimeter bag 05/31/23 0324   Securement Method Securing device (Describe) 05/31/23 0324   Output (mL) 75 mL 05/31/23 0215   Number of days: 0       RECENT LABS:  Chemistries:  Recent Labs     05/30/23  0436 05/31/23  0710   NA 142 138   K 4.6 4.0   CO2 27 31   BUN 13 15   CREAT 0.9 0.8   CA 9.2 9.0   MG 1.9 1.8   PHOS 3.4 2.6   ANION 12 8*   GFR > 60 >  60    GI:  No results for input(s): "AST", "ALT", "TBILI", "DBILI", "IBIL", "ALKPHOS", "GGTP", "ALBUMIN", "LIP" in the last 72 hours.     CBC:  Recent Labs     05/30/23  0436 05/31/23  0710   WBC 7.8 5.7   HGB 14.7 13.4   HCT 44.8 41.2   PLTA 252 230    Coags:   No results for input(s): "INR", "PT", "APTT", "HPTT", "FIB" in the last 72 hours.   Trops, BNP, D-dimer, Lactate, C-RP:  No results for input(s): "TROPTHS", "TROPTHSDELTA", "PROBNP", "DDPEDVT", "DD", "LACTICACID", "CRP" in the last 72 hours.      Finger Sticks:  No results for input(s): "FINGERSTICKR" in the last 72 hours.   Urinalysis:  No results for input(s): "UACOL", "UACLA", "UAGLU", "UABIL", "UAKET", "SPEGRAVURINE", "UAOCC", "UAPH", "UAPRO", "UABAC", "UARBC", "UAWBC", "UANIT", "LEUKOCYTES", "UAMIC", "UASQE" in the last 72 hours.    Invalid input(s): "UAOBU"       Other important labs include   TSH 2.1  Vit D 18  Folate 8.8      MICROBIOLOGY REVIEW  No new microbiology results to review.    IMAGING AND OTHER STUDIES  No new imaging or other studies to review.      MEDICATIONS  See list in EPIC    ASSESSMENT & PLAN    Cheryl Zimmerman is a 52 year old English-speaking female with complex pulmonary history (COPD (2L NC), OSA not on CPAP, HFpEF, morbid obesity) and complex psych history, polysubstance use disorder with recent relapse presented with suspected alcohol and opioid withdrawal, transferred to the ED with sedation 2/2 hypercarbia from polypharmacy, obesity hypoventilation, COPD and sleep apnea syndrome.  She was treated with bipap and a Narcan drip with improvement in her hypercarbia. While in the ICU she was seen by psych for homocidal ideation. Overnight, she was not using bipap and was in fact on room air with improved mental status so transfer to medical ward was requested.     #Excessive sedation  #Suspected obesity hypoventilation syndrome along with obstructive sleep apnea   #Obesity hypoventilation syndrome  #Acute hypercapnic respiratory failure  #Polysubstance abuse and overdose contributing   Sedation seemed out of proportion to medications given. Neurological examination nonfocal and noncontributory. CT brain obtained to exclude remote possibility of stroke and was normal. Based on exam decided trial of Narcan initially 0.4 and then 0.8 to which she responded partially. In view of this it was decided to start her on a Narcan drip with a view to target respiratory rate of 12.  Given her probable undiagnosed OSA and acute hypercapnic respiratory failure, it appears reasonable to trial her on BiPAP 12/10 with serial gases as needed. She responded to Narcan and was placed on Narcan drip.  The drip was tapered off after her mentation improved. pH is 7.32 and CO2 is 60 on a VBG.  Likely within normal limit pH on ABG. She is refusing BiPAP therapy.  Chest x-ray shows bibasilar atelectasis, mild. Repeat VBG in a.m. to trend CO2 and pH. pH:7.41, PCO2: 52, PO2:101, HCO3: 32.5.  -BiPAP at night 14/7 and during the day 4 hours on 4 hours off.  She is not agreeable.  -Oxygen to maintain sat between 88 and 92%  -Recommend  holding any sedating medication on her list, including the mirtazapine, melatonin, Prazosin, gabapentin, ativan and muscle relaxers such as baclofen etc  -Recommended labs ordered: B12 , folate, vitamin D, thyroid panel, rpr/syphilis screen  -Vitamin B12 repleted  -Ensure repletion  of electolytes, K? 3.5, Mg ? 2, PO4 ? 3  -Avoid deliriogenic medications as able (anticholinergics, sedatives etc)   -Sleep study as OP for presumed OSA if this has not already been done  -Consider VBG tomorrow to assess for respiratory/CO2 status.  -Continue Symbicort inhaler    #Vitamin D Deficiency  Low Vit D 18  -Started Vit D 1000 units daily.   [ ]  Continue outpatient for 8 weeks      #Polysubstance use disorder  #Opiate use   # Cocaine/crack use disorder  #Alcohol withdrawal (resolved)  Suboxone 8mg  TID (outpatient pain regimen) should provide adequate management of opiate withdrawal. High risk. History of complicated withdrawal. Patient states that she took half a gallon of alcohol prior to admission. No clear physiologic signs of alcohol withdrawal -- no diarrhea, sweating, abdominal cramping, nausea, vomiting, myalgias, yawning, shivering.  -Ativan discontinued with CIWA scoring due to patient being oversedated in this hospitalization  -Recovery coach referral  -Continue Keppra 750 mg twice daily and Trileptal 150 mg bid for known seizures  -Nicotine gum as needed  -Zofran for nausea  [ ]  Acess nausea tomorrow    #PTSD/ Depression  Patient is currently between psychiatrists and possibly overmedicated with psychiatric medications. Denied inpatient psychiatric care. -Patient is not an acute threat to self others at this time and is not a candidate for inpatient psychiatric admission.  We will continue to follow while inpatient.  -Consulted psychiatry for evaluation of her mental health and her prescribed regimen  -Continue duloxetine DR 30 mg daily  -Holding all sedative medications  -With consideration for outpatient psychiatry  follow-up. Connecting with Kyra Leyland for outpatient provider.  -Coordinating with PCP Dr. [name] for medication regimen titration for discharge planning.    #Urinary Retention  The patient did have urine retention and a straight cath was used to drain 1 L on arrival to ICU.  Indwelling bladder catheter in place.  Likely due to medication.  Will continue to monitor  [ ]  D/c foley  [ ] Assess urine output s/p foley removal    #Right ankle fracture with right foot in cast   -Continue nonweightbearing left great  -Pain well managed and tolerated.  -Will continue following outpatient with orthopedics.     #Acute on chronic pain  -Continue standing Tylenol and prn Motrin  -Holding home gabapentin and baclofen due to sedation  -Continue Suboxone 8mg  TID      #Skin lesion  #Pruritis  Suspect fungal infection. Could also be having heat rash  -Continue miconazole nitrate ointment on rash in sacrum, abdominal folds, and skin under the breast.  -Wound eval and treat consult placed     #Home Meds  HLD: Continue Lipitor 80 mg nightly  COPD: Continue Symbicort twice daily  HFpEF: Continue Bumex 4 mg daily and spironolactone 100 mg daily. 04/08/2022 LVEF 65%  Hypothyroidism: Continue Synthroid 25 mcg every morning before breakfast    #Discharge planning  Patient lives alone in a wheelchair accessible apartment, but feels overwhelmed and believes that she needs additional support at home.  She is struggling to manage her non-weight bearing status alone and requests [facility] and home PT and (perhaps) additional personal care. She is not interested in short term rehab     # Tele: Yes  # Foley: Yes for urinary retention  # Diet: Regular  Protonix 40 mg daily  # DVT ppx: switch to enoxaparin 70 mg subq daily  # Bowel regimen: Senna PRN, MiraLAX  # Language: English   #  Dispo: ICU given  Medication Reconciliation: Done    Code Status: Full Code  Health care proxy: Data Unavailable  Data Unavailable      Discussed with attending,  Traci Sermon, MD    [place] 06/01/2023

## 2023-06-02 ENCOUNTER — Other Ambulatory Visit (HOSPITAL_BASED_OUTPATIENT_CLINIC_OR_DEPARTMENT_OTHER): Payer: Self-pay | Admitting: Internal Medicine

## 2023-06-02 DIAGNOSIS — F1490 Cocaine use, unspecified, uncomplicated: Secondary | ICD-10-CM | POA: Diagnosis not present

## 2023-06-02 DIAGNOSIS — G894 Chronic pain syndrome: Secondary | ICD-10-CM

## 2023-06-02 DIAGNOSIS — F10930 Alcohol use, unspecified with withdrawal, uncomplicated: Secondary | ICD-10-CM | POA: Diagnosis not present

## 2023-06-02 DIAGNOSIS — R45850 Homicidal ideations: Secondary | ICD-10-CM | POA: Diagnosis not present

## 2023-06-02 DIAGNOSIS — I5032 Chronic diastolic (congestive) heart failure: Secondary | ICD-10-CM | POA: Diagnosis not present

## 2023-06-02 MED ORDER — BUMETANIDE 2 MG PO TABS
4.0000 mg | ORAL_TABLET | Freq: Every day | ORAL | 3 refills | Status: DC
Start: 2023-06-02 — End: 2023-06-21

## 2023-06-02 MED ORDER — LEVETIRACETAM 750 MG PO TABS
750.0000 mg | ORAL_TABLET | Freq: Two times a day (BID) | ORAL | 3 refills | Status: DC
Start: 2023-06-02 — End: 2023-06-21

## 2023-06-02 MED ORDER — MICONAZOLE NITRATE 2 % EX OINT
1.00 | TOPICAL_OINTMENT | Freq: Every day | CUTANEOUS | 0 refills | Status: AC | PRN
Start: 2023-06-02 — End: 2023-07-02

## 2023-06-02 MED ORDER — BUPRENORPHINE HCL-NALOXONE HCL 8-2 MG SL SUBL
1.00 | SUBLINGUAL_TABLET | Freq: Three times a day (TID) | SUBLINGUAL | 0 refills | Status: AC
Start: 2023-06-02 — End: 2023-06-09

## 2023-06-02 MED ORDER — MELATONIN 5 MG PO TABS
5.0000 mg | ORAL_TABLET | ORAL | 0 refills | Status: DC | PRN
Start: 2023-06-02 — End: 2023-06-30

## 2023-06-02 MED ORDER — BACLOFEN 10 MG PO TABS
10.0000 mg | ORAL_TABLET | Freq: Every day | ORAL | 0 refills | Status: DC | PRN
Start: 2023-06-02 — End: 2023-06-21

## 2023-06-02 MED ORDER — VITAMIN D 25 MCG (1000 UT) PO TABS
1000.0000 [IU] | ORAL_TABLET | Freq: Every day | ORAL | 1 refills | Status: DC
Start: 2023-06-03 — End: 2023-06-02

## 2023-06-02 MED ORDER — BUPRENORPHINE HCL-NALOXONE HCL 8-2 MG SL SUBL
1.0000 | SUBLINGUAL_TABLET | Freq: Three times a day (TID) | SUBLINGUAL | 0 refills | Status: DC
Start: 2023-06-02 — End: 2023-06-02

## 2023-06-02 MED ORDER — VITAMIN D 25 MCG (1000 UT) PO TABS
1000.0000 [IU] | ORAL_TABLET | Freq: Every day | ORAL | 1 refills | Status: DC
Start: 2023-06-03 — End: 2023-10-25

## 2023-06-02 MED ORDER — DULOXETINE HCL 30 MG PO CPEP
30.0000 mg | ORAL_CAPSULE | Freq: Every day | ORAL | 2 refills | Status: DC
Start: 2023-06-02 — End: 2023-06-21

## 2023-06-02 NOTE — Page 1 Discharge - Orders (Signed)
PATIENT CARE REFERRAL FORM    Patient Demographics:           Name MRN Sex Birthdate Social Security   Cheryl Zimmerman [id number] Female  November 03, 1970  (52 year old) [id number]   Address Home Phone Insurance ID Marital Status Religion   [address]  [place] [address] [phone] Payor: [facility] [employer] /  /  /   [id number] Divorced None   PCP Admission Date Discharge Date Attending Provider Code Status   [name], MD 05/28/23      06/02/23 Traci Sermon, MD Full Code     Primary Contacts:            Extended Emergency Contact Information  Primary Emergency Contact: Contact,No  Mobile Phone: [phone]  Relation: Unknown    Healthcare Proxy: Data Unavailable  Phone Number: Data Unavailable    Discharge:             Discharge to: Home With Home Health  Address:    Has discharge transportation arranged?    Transportation at discharge:      Contact number after discharge:    Best time of day to contact:    Nurse/staff has permission to speak with:      Interpreter requested: No  Family notified of disposition:         Diagnosis:             Isolation:   Infections:     Patient Active Problem List:     Dyspnea on minimal exertion     Suicidal behavior with attempted self-injury ([name])     Tobacco use disorder     Systolic congestive heart failure ([name])     Seizure disorder ([name])     Depressive disorder     Suicidal ideation     Afib ([name])     Alcohol use disorder     Borderline personality disorder ([name])     Chronic pain disorder     Morbid obesity ([name])     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)     PTSD (post-traumatic stress disorder)     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     (HFpEF) heart failure with preserved ejection fraction ([name])     MDD (major depressive disorder), recurrent, severe, with psychosis ([name])     Requires continuous at home supplemental oxygen     Spinal stenosis of lumbar 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 claudication     Fall in home     Gait instability     Hypokalemia     Lung  nodule     Stable angina     Chest pain, atypical     Obesity hypoventilation syndrome ([name])     Osteoarthritis of spine with radiculopathy, lumbar region     Chronic bilateral low back pain with bilateral sciatica     Hypoxia     Alcohol withdrawal syndrome, uncomplicated ([name])     Cocaine use     Homicidal ideation     Somnolence     Obstructive sleep apnea syndrome     Acute respiratory failure with hypoxia and hypercapnia ([name])     Acute metabolic encephalopathy     B12 deficiency     Vitamin D deficiency      Review of Patient's Allergies indicates:   Bee venom               Anaphylaxis   Metolazone  Other (See Comments)    Comment:Hypokalemia   Trazodone               Hives   Methylprednisolone      Dizziness, Drowsiness    Comment:Also believes syncope. Seems to tolerate             inhalational and epidural steroid.   Nutritional supplem*       Tegretol [carbamaze*    Hives   Hydroxyzine             Nausea Only   Hospital Administered Immunizations Administered for This Admission  Never Reviewed      No immunizations on file.          Physician Order's: (Include specific orders for Diet, Labs, or Tests)       Page 1 Instructions    Nursing:   Assess and monitor mental status and vitals: Notify provider for vitals showing HR above 100 or below 60, SBP above 170 or below 100, Temperature above 38.0C or below 36.0C, or SpO2 less than 88%.     Please ensure patient is taking medications as prescribed.  Particularly, please ensure adherence with the following medications:     START taking these medications     cholecalciferol (VITAMIN D3) 25 MCG (1000 UT) tablet  Take 1 tablet by mouth in the morning.     miconazole nitrate (ALOE VESTA) 2 % OINT -patient to get this over-the-counter  Apply 1 tube topically daily as needed (IAD)     buprenorphine-naloxone (SUBOXONE) 8-2 MG sublingual tablet  Place 1 tablet under the tongue in the morning and 1 tablet at noon and 1 tablet before bedtime. Max Daily  Amount: 3 tablets. Do all this for 7 days. -Patient needs refill from primary care provider  Suboxone use for chronic pain.    CONTINUE these medications which have CHANGED     baclofen (LIORESAL) 10 MG tablet  Take 1 tablet by mouth daily as needed     Melatonin 5 MG TABS Tablet  Take 5 mg by mouth as needed for Sleep Only take 1 tablet as needed     DULoxetine (CYMBALTA) 30 MG capsule  Take 1 capsule by mouth in the morning.     bumetanide (BUMEX) 2 MG tablet  Take 2 tablets by mouth in the morning.     levETIRAcetam (KEPPRA) 750 MG tablet  Take 1 tablet by mouth in the morning and 1 tablet before bedtime.        CONTINUE these medications which have NOT CHANGED     ondansetron (ZOFRAN-ODT) 8 MG disintegrating tablet  Take 8 mg by mouth every 12 (twelve) hours as needed for Nausea     lactulose (CHRONULAC) 20 GM/30 ML solution  Take 30 g by mouth as needed     OXcarbazepine (TRILEPTAL) 150 MG tablet  TAKE 1 TABLET BY MOUTH EVERY MORNING AND 1 TABLET BEFORE BEDTIME     sucralfate (CARAFATE) 1 GM tablet  Take 1 tablet by mouth in the morning and 1 tablet at noon and 1 tablet in the evening and 1 tablet before bedtime.     omeprazole (PRILOSEC) 40 MG capsule  Take 1 capsule by mouth in the morning.     spironolactone (ALDACTONE) 100 MG tablet  TAKE 1 TABLET BY MOUTH EVERY DAY IN THE MORNING     atorvastatin (LIPITOR) 80 MG tablet  TAKE 1 TABLET BY MOUTH EVERY DAY IN THE MORNING     levothyroxine (SYNTHROID)  25 MCG tablet  TAKE 1 TABLET BY MOUTH EVERY DAY IN THE MORNING BEFORE BREAKFAST     nicotine polacrilex (NICORETTE) 4 MG gum  Take 1 each by mouth as needed for Craving (Nicotine)     diphenhydrAMINE (BENADRYL) 25 MG capsule  Take 25 mg by mouth every 6 (six) hours as needed for Itching     budesonide-formoterol (SYMBICORT) 160-4.5 MCG/ACT inhaler  Inhale 2 puffs into the lungs in the morning and 2 puffs before bedtime.     polyethylene glycol (GLYCOLAX/MIRALAX) 17 g packet  Take 1 packet by mouth in the  morning.     Multiple Vitamin (MULTIVITAMIN) tablet  Take 1 tablet by mouth in the morning.     acetaminophen (TYLENOL) 325 MG tablet  Take 2 tablets by mouth every 6 (six) hours as needed for Pain     fluticasone (FLONASE) 50 MCG/ACT nasal spray  2 sprays by Each Nostril route in the morning and 2 sprays before bedtime.     OTHER MEDICATION  1 each by Other route in the morning. Wheelchair: Bariatric motorized wheel chare (height 5'9", weight 328 lb, bmi 48.4)  For use as directed.       STOP taking these medications     traMADol (ULTRAM) 50 MG tablet     FLUoxetine (PROZAC) 10 MG capsule     busPIRone (BUSPAR) 10 MG tablet     methocarbamol (ROBAXIN) 500 MG tablet     QUEtiapine (SEROQUEL) 50 MG tablet     mirtazapine (REMERON) 7.5 MG tablet     gabapentin (NEURONTIN) 600 MG tablet     thiamine (VITAMIN B-1) 100 MG tablet     FLUoxetine (PROZAC) 20 MG capsule     FLUoxetine (PROZAC) 40 MG capsule     diclofenac (VOLTAREN) 1 % GEL Gel    Labs:   Vitamin D levels check as needed    Nutrition:   Patient was placed on Regular diet on this admission. Please evaluate diet orders on return  Please ensure patient is provided fluids within reach from bed and assistance with feeding. In addition to meals, please make sure patient is offered of oral water four times a day.       Physical/Occupational Therapy:   Please ensure patient gets PT evaluation and subsequent treatment.    Respiratory Therapy:  [ ]  Recommend outpatient sleep study for OSA, patient seems adamant about not using BiPAP or CPAP    Transition of Care  Please ensure patient has primary care follow up in 1 week.  Please ensure patient attends his other upcoming appointments: Follow up with orthopedic surgery (at [facility]) for right ankle fracture with foot cast.     [ ]  Recommend revisiting medication that were discontinued during this hospitalization. The following medications were stopped: Prazosin, Gabapentin, Fluoxetine, Buspirone,  methocarbamol, mirtazapine, Seroquel, tramadol.  [ ]  we have not restarted anything with respect to her PTSD however she will have f/u with psych OP  [ ]  Melatonin decreased to 5mg  Nightly from 15mg  and Baclofen to be use as needed only.   [ ]  Continue outpatient management of her suboxone for chronic pain.  [ ]  Continue vitamin D tablet for 8 weeks as patient was found to be deficient, consider rechecking as needed                           Insulin Instructions  No orders of the defined types were placed  in this encounter.        Current Discharge Medication List    START taking these medications    miconazole nitrate (ALOE VESTA) 2 % OINT  Apply 1 tube topically daily as needed (IAD)  Qty: 56 g Refills: 0    buprenorphine-naloxone (SUBOXONE) 8-2 MG sublingual tablet  Place 1 tablet under the tongue in the morning and 1 tablet at noon and 1 tablet before bedtime. Max Daily Amount: 3 tablets. Do all this for 7 days.  Qty: 21 tablet Refills: 0    cholecalciferol (VITAMIN D3) 25 MCG (1000 UT) tablet  Take 1 tablet by mouth in the morning.  Qty: 30 tablet Refills: 1      CONTINUE these medications which have CHANGED    baclofen (LIORESAL) 10 MG tablet  Take 1 tablet by mouth daily as needed  Qty: 60 tablet Refills: 0    Melatonin 5 MG TABS Tablet  Take 5 mg by mouth as needed for Sleep Only take 1 tablet as needed  Qty: 90 tablet Refills: 0    DULoxetine (CYMBALTA) 30 MG capsule  Take 1 capsule by mouth in the morning.  Qty: 30 capsule Refills: 2  Associated Diagnoses:Chronic pain disorder    bumetanide (BUMEX) 2 MG tablet  Take 2 tablets by mouth in the morning.  Qty: 60 tablet Refills: 3    levETIRAcetam (KEPPRA) 750 MG tablet  Take 1 tablet by mouth in the morning and 1 tablet before bedtime.  Qty: 180 tablet Refills: 3      CONTINUE these medications which have NOT CHANGED    ondansetron (ZOFRAN-ODT) 8 MG disintegrating tablet  Take 8 mg by mouth every 12 (twelve) hours as needed for Nausea    lactulose (CHRONULAC)  20 GM/30 ML solution  Take 30 g by mouth as needed    OXcarbazepine (TRILEPTAL) 150 MG tablet  TAKE 1 TABLET BY MOUTH EVERY MORNING AND 1 TABLET BEFORE BEDTIME  Qty: 60 tablet Refills: 1    sucralfate (CARAFATE) 1 GM tablet  Take 1 tablet by mouth in the morning and 1 tablet at noon and 1 tablet in the evening and 1 tablet before bedtime.  Qty: 360 tablet Refills: 3    omeprazole (PRILOSEC) 40 MG capsule  Take 1 capsule by mouth in the morning.  Qty: 90 capsule Refills: 3    spironolactone (ALDACTONE) 100 MG tablet  TAKE 1 TABLET BY MOUTH EVERY DAY IN THE MORNING  Qty: 90 tablet Refills: 3    atorvastatin (LIPITOR) 80 MG tablet  TAKE 1 TABLET BY MOUTH EVERY DAY IN THE MORNING  Qty: 90 tablet Refills: 3    levothyroxine (SYNTHROID) 25 MCG tablet  TAKE 1 TABLET BY MOUTH EVERY DAY IN THE MORNING BEFORE BREAKFAST  Qty: 90 tablet Refills: 3    nicotine polacrilex (NICORETTE) 4 MG gum  Take 1 each by mouth as needed for Craving (Nicotine)  Qty: 100 tablet Refills: 11    diphenhydrAMINE (BENADRYL) 25 MG capsule  Take 25 mg by mouth every 6 (six) hours as needed for Itching    budesonide-formoterol (SYMBICORT) 160-4.5 MCG/ACT inhaler  Inhale 2 puffs into the lungs in the morning and 2 puffs before bedtime.    polyethylene glycol (GLYCOLAX/MIRALAX) 17 g packet  Take 1 packet by mouth in the morning.  Qty: 90 packet Refills: 3    Multiple Vitamin (MULTIVITAMIN) tablet  Take 1 tablet by mouth in the morning.  Qty: 90 tablet Refills: 3  acetaminophen (TYLENOL) 325 MG tablet  Take 2 tablets by mouth every 6 (six) hours as needed for Pain    fluticasone (FLONASE) 50 MCG/ACT nasal spray  2 sprays by Each Nostril route in the morning and 2 sprays before bedtime.    OTHER MEDICATION  1 each by Other route in the morning. Wheelchair: Bariatric motorized wheel chare (height 5'9", weight 328 lb, bmi 48.4)  For use as directed.    Please fax to [facility] at [phone]  DX: Spinal stenosis of lumbar 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 claudication,  Gait instability, Morbid obesity ([name])  ICD10:M48.061, R26.81, E66.01.  Qty: 1 each Refills: 0  Associated Diagnoses:Spinal stenosis of lumbar 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 claudication; Gait instability; Morbid obesity ([name])      STOP taking these medications    traMADol (ULTRAM) 50 MG tablet  Comments:  Reason for Stopping:    FLUoxetine (PROZAC) 10 MG capsule  Comments:  Reason for Stopping:    busPIRone (BUSPAR) 10 MG tablet  Comments:  Reason for Stopping:    methocarbamol (ROBAXIN) 500 MG tablet  Comments:  Reason for Stopping:    QUEtiapine (SEROQUEL) 50 MG tablet  Comments:  Reason for Stopping:    mirtazapine (REMERON) 7.5 MG tablet  Comments:  Reason for Stopping:    gabapentin (NEURONTIN) 600 MG tablet  Comments:  Reason for Stopping:    thiamine (VITAMIN B-1) 100 MG tablet  Comments:  Reason for Stopping:    FLUoxetine (PROZAC) 20 MG capsule  Comments:  Reason for Stopping:    FLUoxetine (PROZAC) 40 MG capsule  Comments:  Reason for Stopping:    diclofenac (VOLTAREN) 1 % GEL Gel  Comments:  Reason for Stopping:          Palliative Care Referral  No orders of the defined types were placed in this encounter.    Hospice Referral  No orders of the defined types were placed in this encounter.    Discharge Supplies:      Patient declined referral to tobacco cessation counseling at discharge     Order already placed  or patient DECLINED  nicotine replacement med           Follow-up Information    None       Current and Future Appointments at [facility] (90 Days) 06/02/2023 - 08/31/2023      None            Physical Therapy:              Weight Bearing Status LRE:    Weight Bearing Status LLE:      Frequency:    Recommendation:      Recommended Equipment:    Patient Stated Goals:    Prognosis:      Home Health Services:           Home Health Services:      CERTIFICATION (When Applicable):         I certify that the above named patient is: Requires skilled nursing care on a continuing basis for any of  the conditions for which he/she received care during this hospitalization.    Who will follow this patient after discharge: [name], MD       Electronically signed by:  Dominga Ferry, MD

## 2023-06-02 NOTE — Plan of Care (Addendum)
Discharge paperwork completed, answered all questions and concerns, pt belongings w/ pt at bedside. Pt items in the safe returned to pt including wallet, keys, medications, phone, charger, vapes, and rolling papers. Pt received meds to bed. Pt going home via chair car. Chair care booked for 2:30pm. WCM until OOF.       Problem: Safety  Goal: Free from accidental physical injury  Outcome: Adequate for Discharge     Problem: Daily Care  Goal: Daily care needs are met  Description: Assess and monitor ability to perform self care and identify potential discharge needs.  Outcome: Adequate for Discharge     Problem: Pain  Goal: Patient's pain/discomfort is manageable  Description: Assess and monitor patient's pain using appropriate pain scale. Collaborate with interdisciplinary team and initiate plan and interventions as ordered. Re-assess patient's pain level 30 - 60 minutes after pain management intervention.   Outcome: Adequate for Discharge     Problem: Compromised Skin Integrity  Description: Use this problem when pressure ulcers are classified as stage I or II.  Goal: Skin integrity is maintained or improved  Description: Assess and monitor skin integrity. Identify patients at risk for skin breakdown on admission and per policy. Collaborate with interdisciplinary team and initiate plans and interventions as needed.  Outcome: Adequate for Discharge     Problem: Alcohol Withdrawal  Goal: Patient will demonstrate knowledge of signs/symptoms  Description: Patient will demonstrate knowledge of signs/symptoms of alcohol withdrawal, treatment options, and available community resources.  Outcome: Adequate for Discharge  Goal: Patient will maintain physiological stability  Outcome: Adequate for Discharge     Problem: Hemodynamic Status  Goal: Patient has stable vital signs and fluid balance  Description: Assess and monitor patient's heart rate, rhythm, respiratory rate, peripheral pulses, capillary refill, color, body  temperature, intake and output, labs and physical activity tolerance.   Observe for signs of chest pain (note location, duration, severity, radiation and associated symptoms such as diaphoresis, nausea, indigestion).  Monitor for signs and symptoms of heart failure (eg. shortness of breath, edema of feet/ankles/legs, rapid irregular heart rate, coughing, wheezing, white/pink blood tinged sputum, sudden weight gain, chest pain). Collaborate with interdisciplinary team and initiate plan and interventions as ordered.  Outcome: Adequate for Discharge     Problem: Inadequate Airway Clearance  Goal: Patient will maintain patent airway  Description: Assess and monitor breath sounds, cough and sputum (if present), and intake/output. Collaborate with respiratory therapy to administer medications and treatments.   Outcome: Adequate for Discharge  Goal: Patient will achieve/maintain normal respiratory rate/effort  Description: Respiratory rate and effort will be within normal limits for the patient  Outcome: Adequate for Discharge     Problem: Inadequate Gas Exchange  Goal: Patient is adequately oxygenated and ventilation is improved  Description: Assess and monitor vital signs, oxygen saturation, respiratory status to include rate, depth, effort, and lung sounds, mental status, cyanosis, and labs (ABG's).  Monitor effects of medications that may sedate the patient.  Collaborate with respiratory therapy to administer medications and treatments.  Outcome: Adequate for Discharge  Goal: Nutritional status is improving  Description: Monitor and assess patient for malnutrition (ex- brittle hair, bruises, dry skin, pale skin and conjunctiva, muscle wasting, smooth red tongue, and disorientation). Collaborate with interdisciplinary team and initiate plan and interventions as ordered.  Monitor patient's weight and dietary intake as ordered or per policy. Utilize nutrition screening tool and intervene per policy. Determine patient's  food preferences and provide high-protein, high-caloric foods as appropriate.   Outcome:  Adequate for Discharge     Problem: Cognitive:  Goal: Caregivers and patients knowledge of risk factors and measures for prevention of condition will be supported throughout the hospitalization  Outcome: Adequate for Discharge     Problem: Safety:  Goal: Will remain free from falls throughout the hospitalization,  Outcome: Adequate for Discharge

## 2023-06-02 NOTE — Care Coordination (Signed)
SW provided pt with clothes, as requested by RN. SW to f/u as needed.

## 2023-06-02 NOTE — Telephone Encounter (Signed)
Please Review and send if appropriate     Pharmacy comment: Script Clarification:PRESCRIBERS NPI IS COMING UP INVALID.         [id number] [place],  - [address]  Phone: [phone] Fax: [phone]

## 2023-06-02 NOTE — Plan of Care (Signed)
Pt Lethargic but easily arousable, oriented x4. Calm and cooperative. VSS. No acute distress. Denies pain, n/v, SOB, dizziness. Endorses itchiness and irritation to skin on left lower back, cream applied to reddened areas with relief. Pt refusing sc Lovenox and pulse ox, MD made aware. Frequent o2 checks, sating 92% on 2 L NC. Standby assist for transfer from bed to wheelchair for BR. Meds whole. Bed locked in lowest position, call bell in reach. Continue with plan of care.     Problem: Safety  Goal: Free from accidental physical injury  Outcome: Progressing     Problem: Daily Care  Goal: Daily care needs are met  Description: Assess and monitor ability to perform self care and identify potential discharge needs.  Outcome: Progressing     Problem: Pain  Goal: Patient's pain/discomfort is manageable  Description: Assess and monitor patient's pain using appropriate pain scale. Collaborate with interdisciplinary team and initiate plan and interventions as ordered. Re-assess patient's pain level 30 - 60 minutes after pain management intervention.   Outcome: Progressing     Problem: Compromised Skin Integrity  Description: Use this problem when pressure ulcers are classified as stage I or II.  Goal: Skin integrity is maintained or improved  Description: Assess and monitor skin integrity. Identify patients at risk for skin breakdown on admission and per policy. Collaborate with interdisciplinary team and initiate plans and interventions as needed.  Outcome: Progressing     Problem: Alcohol Withdrawal  Goal: Patient will demonstrate knowledge of signs/symptoms  Description: Patient will demonstrate knowledge of signs/symptoms of alcohol withdrawal, treatment options, and available community resources.  Outcome: Progressing  Goal: Patient will maintain physiological stability  Outcome: Progressing     Problem: Knowledge Deficit  Goal: Patient/S.O. demonstrates understanding of disease process, treatment plan, medications,  and discharge instructions.  Description: Complete learning assessment and assess knowledge base  Outcome: Progressing     Problem: Hemodynamic Status  Goal: Patient has stable vital signs and fluid balance  Description: Assess and monitor patient's heart rate, rhythm, respiratory rate, peripheral pulses, capillary refill, color, body temperature, intake and output, labs and physical activity tolerance.   Observe for signs of chest pain (note location, duration, severity, radiation and associated symptoms such as diaphoresis, nausea, indigestion).  Monitor for signs and symptoms of heart failure (eg. shortness of breath, edema of feet/ankles/legs, rapid irregular heart rate, coughing, wheezing, white/pink blood tinged sputum, sudden weight gain, chest pain). Collaborate with interdisciplinary team and initiate plan and interventions as ordered.  Outcome: Progressing     Problem: Inadequate Airway Clearance  Goal: Patient will maintain patent airway  Description: Assess and monitor breath sounds, cough and sputum (if present), and intake/output. Collaborate with respiratory therapy to administer medications and treatments.   Outcome: Progressing  Goal: Patient will achieve/maintain normal respiratory rate/effort  Description: Respiratory rate and effort will be within normal limits for the patient  Outcome: Progressing     Problem: Inadequate Gas Exchange  Goal: Patient is adequately oxygenated and ventilation is improved  Description: Assess and monitor vital signs, oxygen saturation, respiratory status to include rate, depth, effort, and lung sounds, mental status, cyanosis, and labs (ABG's).  Monitor effects of medications that may sedate the patient.  Collaborate with respiratory therapy to administer medications and treatments.  Outcome: Progressing  Goal: Nutritional status is improving  Description: Monitor and assess patient for malnutrition (ex- brittle hair, bruises, dry skin, pale skin and conjunctiva,  muscle wasting, smooth red tongue, and disorientation). Collaborate with interdisciplinary team  and initiate plan and interventions as ordered.  Monitor patient's weight and dietary intake as ordered or per policy. Utilize nutrition screening tool and intervene per policy. Determine patient's food preferences and provide high-protein, high-caloric foods as appropriate.   Outcome: Progressing     Problem: Activity:  Goal: Mobility will be supported throughout the hospitalization,  Outcome: Progressing     Problem: Cognitive:  Goal: Caregivers and patients knowledge of risk factors and measures for prevention of condition will be supported throughout the hospitalization  Outcome: Progressing     Problem: Safety:  Goal: Will remain free from falls throughout the hospitalization,  Outcome: Progressing

## 2023-06-02 NOTE — Discharge Inst - Page 1 Instructions (Addendum)
Nursing:   Assess and monitor mental status and vitals: Notify provider for vitals showing HR above 100 or below 60, SBP above 170 or below 100, Temperature above 38.0C or below 36.0C, or SpO2 less than 88%.     Please ensure patient is taking medications as prescribed.  Particularly, please ensure adherence with the following medications:     START taking these medications     cholecalciferol (VITAMIN D3) 25 MCG (1000 UT) tablet  Take 1 tablet by mouth in the morning.     miconazole nitrate (ALOE VESTA) 2 % OINT -patient to get this over-the-counter  Apply 1 tube topically daily as needed (IAD)     buprenorphine-naloxone (SUBOXONE) 8-2 MG sublingual tablet  Place 1 tablet under the tongue in the morning and 1 tablet at noon and 1 tablet before bedtime. Max Daily Amount: 3 tablets. Do all this for 7 days. -Patient needs refill from primary care provider  Suboxone use for chronic pain.    CONTINUE these medications which have CHANGED     baclofen (LIORESAL) 10 MG tablet  Take 1 tablet by mouth daily as needed     Melatonin 5 MG TABS Tablet  Take 5 mg by mouth as needed for Sleep Only take 1 tablet as needed     DULoxetine (CYMBALTA) 30 MG capsule  Take 1 capsule by mouth in the morning.     bumetanide (BUMEX) 2 MG tablet  Take 2 tablets by mouth in the morning.     levETIRAcetam (KEPPRA) 750 MG tablet  Take 1 tablet by mouth in the morning and 1 tablet before bedtime.        CONTINUE these medications which have NOT CHANGED     ondansetron (ZOFRAN-ODT) 8 MG disintegrating tablet  Take 8 mg by mouth every 12 (twelve) hours as needed for Nausea     lactulose (CHRONULAC) 20 GM/30 ML solution  Take 30 g by mouth as needed     OXcarbazepine (TRILEPTAL) 150 MG tablet  TAKE 1 TABLET BY MOUTH EVERY MORNING AND 1 TABLET BEFORE BEDTIME     sucralfate (CARAFATE) 1 GM tablet  Take 1 tablet by mouth in the morning and 1 tablet at noon and 1 tablet in the evening and 1 tablet before bedtime.     omeprazole (PRILOSEC) 40 MG  capsule  Take 1 capsule by mouth in the morning.     spironolactone (ALDACTONE) 100 MG tablet  TAKE 1 TABLET BY MOUTH EVERY DAY IN THE MORNING     atorvastatin (LIPITOR) 80 MG tablet  TAKE 1 TABLET BY MOUTH EVERY DAY IN THE MORNING     levothyroxine (SYNTHROID) 25 MCG tablet  TAKE 1 TABLET BY MOUTH EVERY DAY IN THE MORNING BEFORE BREAKFAST     nicotine polacrilex (NICORETTE) 4 MG gum  Take 1 each by mouth as needed for Craving (Nicotine)     diphenhydrAMINE (BENADRYL) 25 MG capsule  Take 25 mg by mouth every 6 (six) hours as needed for Itching     budesonide-formoterol (SYMBICORT) 160-4.5 MCG/ACT inhaler  Inhale 2 puffs into the lungs in the morning and 2 puffs before bedtime.     polyethylene glycol (GLYCOLAX/MIRALAX) 17 g packet  Take 1 packet by mouth in the morning.     Multiple Vitamin (MULTIVITAMIN) tablet  Take 1 tablet by mouth in the morning.     acetaminophen (TYLENOL) 325 MG tablet  Take 2 tablets by mouth every 6 (six) hours as needed for Pain  fluticasone (FLONASE) 50 MCG/ACT nasal spray  2 sprays by Each Nostril route in the morning and 2 sprays before bedtime.     OTHER MEDICATION  1 each by Other route in the morning. Wheelchair: Bariatric motorized wheel chare (height 5'9", weight 328 lb, bmi 48.4)  For use as directed.       STOP taking these medications     traMADol (ULTRAM) 50 MG tablet     FLUoxetine (PROZAC) 10 MG capsule     busPIRone (BUSPAR) 10 MG tablet     methocarbamol (ROBAXIN) 500 MG tablet     QUEtiapine (SEROQUEL) 50 MG tablet     mirtazapine (REMERON) 7.5 MG tablet     gabapentin (NEURONTIN) 600 MG tablet     thiamine (VITAMIN B-1) 100 MG tablet     FLUoxetine (PROZAC) 20 MG capsule     FLUoxetine (PROZAC) 40 MG capsule     diclofenac (VOLTAREN) 1 % GEL Gel    Labs:   Vitamin D levels check as needed    Nutrition:   Patient was placed on Regular diet on this admission. Please evaluate diet orders on return  Please ensure patient is provided fluids within reach from bed and  assistance with feeding. In addition to meals, please make sure patient is offered of oral water four times a day.       Physical/Occupational Therapy:   Please ensure patient gets PT evaluation and subsequent treatment.    Respiratory Therapy:  [ ]  Recommend outpatient sleep study for OSA, patient seems adamant about not using BiPAP or CPAP    Transition of Care  Please ensure patient has primary care follow up in 1 week.  Please ensure patient attends his other upcoming appointments: Follow up with orthopedic surgery (at [facility]) for right ankle fracture with foot cast.     [ ]  Recommend revisiting medication that were discontinued during this hospitalization. The following medications were stopped: Prazosin, Gabapentin, Fluoxetine, Buspirone, methocarbamol, mirtazapine, Seroquel, tramadol.  [ ]  we have not restarted anything with respect to her PTSD however she will have f/u with psych OP  [ ]  Melatonin decreased to 5mg  Nightly from 15mg  and Baclofen to be use as needed only.   [ ]  Continue outpatient management of her suboxone for chronic pain.  [ ]  Continue vitamin D tablet for 8 weeks as patient was found to be deficient, consider rechecking as needed

## 2023-06-04 NOTE — Care Coordination (Signed)
Cleared by MD for d/c home w/o services. Declined by multiple VNAs, as they don't do med mgmt. Pt aware and in agreement. Pt declines home PT. Team, and nursing aware. Driven home by ambulance.     06/04/23 1517   Discharge Reviewed   Reviewed for discharge Yes - CM Discharge   UR Completed? Yes   Final Discharge Plan    Was this a  3 day waiver? No   Discharge disposition Home   Home Home no services (01)   Lives with Alone   Transportation at Discharge Ambulance   IM from medicare delivered N/A   Financial Assistance Pending Not applicable   Caregiver   Does the patient have a caregiver? No

## 2023-06-12 ENCOUNTER — Other Ambulatory Visit (HOSPITAL_BASED_OUTPATIENT_CLINIC_OR_DEPARTMENT_OTHER): Payer: Self-pay

## 2023-06-12 ENCOUNTER — Telehealth (HOSPITAL_BASED_OUTPATIENT_CLINIC_OR_DEPARTMENT_OTHER): Payer: Self-pay

## 2023-06-12 DIAGNOSIS — G894 Chronic pain syndrome: Secondary | ICD-10-CM

## 2023-06-12 NOTE — Telephone Encounter (Signed)
[name], MD  [facility]  Hi,    Could this pt please be scheduled for f/u with me as soon as available? Thanks!    -Melanee Spry                Planned Care Outreach  Call made to Cheryl Zimmerman to schedule an appointment  for follow up . No interpreter needed.   Patient (self) did not answer at [phone] ; unable to leave a message as phone is disconnected Call both numbers on file, both numbers are disconnected.  I have completed the following communication and reminder steps: Patient notified by telephone  Suan Halter, 06/12/2023    On behalf of PCP: [name], MD

## 2023-06-12 NOTE — Telephone Encounter (Signed)
Person calling on behalf of patient: Patient (self)    Cheryl Zimmerman is a 52 year old female who is a patient requesting refills for medication(s)      - medication(s) request: Suboxone, gabapentin, baclofen, methocarbamol, prazosin  - last office visit: 02/01/2023  - last physical exam:       Other Med Adult:  Most Recent BP Reading(s)  06/02/23 : (!) 157/100        Cholesterol (mg/dL)   Date Value   16/06/9603 263 (H)     LOW DENSITY LIPOPROTEIN DIRECT (mg/dL)   Date Value   54/05/8118 192 (H)     HIGH DENSITY LIPOPROTEIN (mg/dL)   Date Value   14/78/2956 40     TRIGLYCERIDES (mg/dL)   Date Value   21/30/8657 301 (H)         THYROID SCREEN TSH REFLEX FT4 (uIU/mL)   Date Value   05/31/2023 2.110         TSH (THYROID STIM HORMONE) (uIU/mL)   Date Value   12/10/2022 1.190       HEMOGLOBIN A1C (%)   Date Value   07/15/2022 6.5 (H)       No results found for: "POCA1C"      INR (no units)   Date Value   06/02/2011 1.0 (L)   06/02/2011 < 1.0 (*L)     INR CARE EVERYWHERE (no units)   Date Value   10/16/2021 1.0   09/23/2021 0.9   01/06/2021 1.1   11/11/2020 1.0   07/05/2020 0.9   07/04/2020 0.8 (L)   03/13/2020 1.0       SODIUM (mmol/L)   Date Value   05/31/2023 138       POTASSIUM (mmol/L)   Date Value   05/31/2023 4.0           CREATININE (mg/dL)   Date Value   84/69/6295 0.8           Documented patient preferred pharmacies:    [facility], [place], [place] - [address]  Phone: [phone] Fax: [phone]    [id number] - [place], [place] - [address]  Phone: [phone] Fax: [phone]    [id number] [place], [place] - [address]  Phone: [phone] Fax: [phone]

## 2023-06-16 ENCOUNTER — Other Ambulatory Visit (HOSPITAL_BASED_OUTPATIENT_CLINIC_OR_DEPARTMENT_OTHER): Payer: Self-pay

## 2023-06-16 ENCOUNTER — Other Ambulatory Visit (HOSPITAL_BASED_OUTPATIENT_CLINIC_OR_DEPARTMENT_OTHER): Payer: Self-pay | Admitting: Family Medicine

## 2023-06-16 NOTE — Telephone Encounter (Signed)
Hello,  [facility] received a request for BARIATRIC WHEEL CHAIR AND CUSHION . In order to process this request, we will first need an RX. I have pended an INCOMPLETE RX below. Please review, complete and approve if appropriate. Please be sure to include DX and ICD 10 and select the type of wheelchair from smartlist.     In addition detailed chart notes discussing the medical need for the supply would be needed (Please use DMEWHEELCHAIR smartphrase to document insurance required criteria in visit note)    Please review.    Thank you

## 2023-06-17 NOTE — Telephone Encounter (Signed)
PER Pharmacy, Cheryl Zimmerman is a 52 year old female has requested a refill of nicotine patches .      Last OFFICE Visit:  02/01/2023 [name], MD     Last TELE Visit: Recent Visits  Date Type Provider Dept   05/23/23 Tessie Fass, Vonita Moss, APRN [facility]   01/26/23 Stephania Fragmin, MD [facility]   12/15/22 Stephania Fragmin, MD [facility]   10/17/22 Televisit [address] [place], PA-C [facility]   10/06/22 Televisit Lorretta Harp, MD [facility]   09/29/22 Televisit [name], Sonny Masters, PA-C [facility]   Showing recent visits within past 365 days with a meds authorizing provider and meeting all other requirements  Future Appointments  No visits were found meeting these conditions.  Showing future appointments within next 0 days with a meds authorizing provider and meeting all other requirements      Last Physical Exam: Not on file.     There are no preventive care reminders to display for this patient.    Other Med Adult:  Most Recent BP Reading(s)  06/02/23 : (!) 157/100        Cholesterol (mg/dL)   Date Value   09/81/1914 263 (H)     LOW DENSITY LIPOPROTEIN DIRECT (mg/dL)   Date Value   78/29/5621 192 (H)     HIGH DENSITY LIPOPROTEIN (mg/dL)   Date Value   30/86/5784 40     TRIGLYCERIDES (mg/dL)   Date Value   69/62/9528 301 (H)         THYROID SCREEN TSH REFLEX FT4 (uIU/mL)   Date Value   05/31/2023 2.110         TSH (THYROID STIM HORMONE) (uIU/mL)   Date Value   12/10/2022 1.190       HEMOGLOBIN A1C (%)   Date Value   07/15/2022 6.5 (H)       No results found for: "POCA1C"      INR (no units)   Date Value   06/02/2011 1.0 (L)   06/02/2011 < 1.0 (*L)     INR CARE EVERYWHERE (no units)   Date Value   10/16/2021 1.0   09/23/2021 0.9   01/06/2021 1.1   11/11/2020 1.0   07/05/2020 0.9   07/04/2020 0.8 (L)   03/13/2020 1.0       SODIUM (mmol/L)   Date Value   05/31/2023 138       POTASSIUM (mmol/L)   Date Value   05/31/2023 4.0           CREATININE (mg/dL)   Date Value   41/32/4401 0.8        Documented patient preferred pharmacies:    [id number] [place], [place] - [address]  Phone: [phone] Fax: [phone]

## 2023-06-17 NOTE — Telephone Encounter (Signed)
PER Pharmacy, Cheryl Zimmerman is a 52 year old female has requested a refill of pantoprazole      Last OFFICE Visit:  02/01/2023 [name], MD     Last TELE Visit: Recent Visits  Date Type Provider Dept   05/23/23 Tessie Fass, Vonita Moss, APRN [facility]   01/26/23 Televisit [name], MD [facility]   12/15/22 Televisit [name], MD [facility]   10/17/22 Televisit Nestor, [place], PA-C [facility]   10/06/22 Televisit Lorretta Harp, MD [facility]   09/29/22 Televisit [name], Sonny Masters, PA-C [facility]   Showing recent visits within past 365 days with a meds authorizing provider and meeting all other requirements  Future Appointments  No visits were found meeting these conditions.  Showing future appointments within next 0 days with a meds authorizing provider and meeting all other requirements      Last Physical Exam: Not on file.     There are no preventive care reminders to display for this patient.    Other Med Adult:  Most Recent BP Reading(s)  06/02/23 : (!) 157/100        Cholesterol (mg/dL)   Date Value   32/44/0102 263 (H)     LOW DENSITY LIPOPROTEIN DIRECT (mg/dL)   Date Value   72/53/6644 192 (H)     HIGH DENSITY LIPOPROTEIN (mg/dL)   Date Value   03/47/4259 40     TRIGLYCERIDES (mg/dL)   Date Value   56/38/7564 301 (H)         THYROID SCREEN TSH REFLEX FT4 (uIU/mL)   Date Value   05/31/2023 2.110         TSH (THYROID STIM HORMONE) (uIU/mL)   Date Value   12/10/2022 1.190       HEMOGLOBIN A1C (%)   Date Value   07/15/2022 6.5 (H)       No results found for: "POCA1C"      INR (no units)   Date Value   06/02/2011 1.0 (L)   06/02/2011 < 1.0 (*L)     INR CARE EVERYWHERE (no units)   Date Value   10/16/2021 1.0   09/23/2021 0.9   01/06/2021 1.1   11/11/2020 1.0   07/05/2020 0.9   07/04/2020 0.8 (L)   03/13/2020 1.0       SODIUM (mmol/L)   Date Value   05/31/2023 138       POTASSIUM (mmol/L)   Date Value   05/31/2023 4.0           CREATININE (mg/dL)   Date Value   33/29/5188 0.8       Documented  patient preferred pharmacies:    [id number] [place], [place] - [address]  Phone: [phone] Fax: [phone]

## 2023-06-21 ENCOUNTER — Ambulatory Visit: Payer: No Typology Code available for payment source

## 2023-06-21 DIAGNOSIS — G894 Chronic pain syndrome: Secondary | ICD-10-CM

## 2023-06-21 DIAGNOSIS — I5033 Acute on chronic diastolic (congestive) heart failure: Secondary | ICD-10-CM

## 2023-06-21 DIAGNOSIS — F251 Schizoaffective disorder, depressive type: Secondary | ICD-10-CM | POA: Diagnosis not present

## 2023-06-21 NOTE — Progress Notes (Addendum)
RESIDENT PRIMARY CARE NOTE       SUBJECTIVE  Cheryl Zimmerman is a 52 year old English-speaking female here for:    Chief concern(s): No chief complaint on file.       #hosp fu  -admitted for opioid withdrawal and alcohol withdrawal  -reports feeling well since leaving hospital, no new nor acute symptomatic complaints; feels alert and oriented  -pt noting that her cca will no longer transport pt to [place] for appointments and they are working on finding her a new PCP and care team closer to where she has housing    From dc summary:  For pain: Suboxone 8-2 TID, Cymbalta 30, prn Baclofen, Tylenol, and Motrin  For seizure: Trileptal, Keppra  No other psychiatric medications  Continue home regimen for HFpEF (Bumex, Spironolactone), Hypothyroidism (Synthroid), Hyperlipidemia (Lipitor) and COPD (Symbicort)    Meds stopped on dc: Prazosin, Gabapentin, Fluoxetine, Buspirone, methocarbamol, mirtazapine, seroquel, tramadol.     Of the above medications stopped this admission, has only stopped/is not taking seroquel, tramadol, less clear if is taking buspirone, fluoxetine, mirtazapine, but IS taking all other (e.g. prazosin, gabapentin), which she has prior prescriptions of. Part of reason for the significant number of meds discontinued is that her hospital course was complicated by oversedation requiring BiPAP in ICU. She is notably quite lucid and alert today, not feeling sleepy/sedated    #CHF/HFpEF  -didn't have rx of bumex on discharge  -breathing fine, no swelling pt has noticed, using oxygen only PRN at night  -pt doesn't have scale at home/no way to weigh herself  -was off any diuretic since presenting to OSH this summer for foot fx; OSH stopped meds because, per pt, there echocardiogram looked normal  -not taking spiro    #OUD  #chronic pain  -suboxone 8 TID, last filled 06/13/2023, 90 tabsx30d  -pain better controlled now that back on suboxone  -gabapentin, duloxetine, baclofen, methocarbamol helping as well    ROS -  Pertinent symptoms as noted above, other ROS negative    Active Medical Issues:  Patient Active Problem List:     Dyspnea on minimal exertion     Suicidal behavior with attempted self-injury ([name])     Tobacco use disorder     Systolic congestive heart failure ([name])     Seizure disorder ([name])     Depressive disorder     Suicidal ideation     Afib ([name])     Alcohol use disorder     Borderline personality disorder ([name])     Chronic pain disorder     Morbid obesity ([name])     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)     PTSD (post-traumatic stress disorder)     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     (HFpEF) heart failure with preserved ejection fraction ([name])     MDD (major depressive disorder), recurrent, severe, with psychosis ([name])     Requires continuous at home supplemental oxygen     Spinal stenosis of lumbar 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 claudication     Fall in home     Gait instability     Hypokalemia     Lung nodule     Stable angina ([name])     Chest pain, atypical     Obesity hypoventilation syndrome ([name])     Osteoarthritis of spine with radiculopathy, lumbar region     Chronic bilateral low back pain with bilateral sciatica     Hypoxia     Alcohol withdrawal syndrome,  uncomplicated ([name])     Cocaine use     Homicidal ideation     Somnolence     Obstructive sleep apnea syndrome     Acute respiratory failure with hypoxia and hypercapnia ([name])     Acute metabolic encephalopathy     B12 deficiency     Vitamin D deficiency      Past medical, surgical, and social history reviewed.       OBJECTIVE  There were no vitals taken for this visit.  Most Recent Weight Reading(s)  05/29/23 : (!) 141.1 kg (311 lb 1.1 oz)  02/01/23 : (!) 146 kg (321 lb 14 oz)  12/31/22 : (!) 155.2 kg (342 lb 1.6 oz)  12/11/22 : (!) 158.8 kg (350 lb)  12/10/22 : (!) 158.8 kg (350 lb)    Most Recent BP Reading(s)  06/02/23 : (!) 157/100  02/01/23 : 117/79  12/31/22 : 111/70  12/14/22 : 115/68  12/10/22 : 110/75      I personally reviewed  relevant labs, imaging, and studies in EpicCare.       ASSESSMENT AND PLAN  Cheryl Zimmerman is a 52 year old female who presents via phone televisit for hosp f/u.     (G89.4) Chronic pain disorder  (primary encounter diagnosis)  Comment: continue suboxone 8 TID, duloxetine, gabapentin, baclofen, methocarbamol PRN. Rx'ing a one month supply of suboxone today with plan for telephone check in prior to next refill; pt will not be able to come into clinic as her care team will not provide transport to [place], and likely wont have new PCP until January 2025 or so? This Clinical research associate will serve as Teacher, music prescriber until then.    (F25.1) Schizoaffective disorder, depressive type ([name])  Comment: No longer taking seroquel, mirtazapine, fluoxetine nor buspirone from what can be gathered over phone conversation today (these were recommended to be stopped on discharge), though is still taking nightly prazosin, which pt finds incredibly helpful for sleep in addition to melatonin. Pt is alert with clear speech, very coherent, safe to continue from a sedation perspective; pt will benefit from close f/u with psych care, currently getting set up by her care team, closer to home in [place]    (I50.33) Acute on chronic diastolic (congestive) heart failure ([name])  Comment: prior to recent admission was without any diuretic since presenting to OSH for ortho issue/foot, resumed while admitted but on discharge pt didn't have access to meds, no diuresis since discharge. Despite this, only using oxygen at night which is baseline, no orthopnea/PND, no LE edema. Given pt's mutliple admission for acute decompensated HF exac/HFpEF, she will almost certainly continue to need loop diuresis at this time.  Plan: Start bumex at 2mg  daily, and if pt notices and signs sx of HF (reviewed with pt) to increase back to her baseline/discharge dose of 4mg  daily and to call clinic right away      **Pt noting that her cca will no longer transport pt to [place]  for appointments and they are working on finding her a new PCP and care team closer to where she has housing    Follow up in approximately 3 months with this Clinical research associate or new PCP, new PCP if this is possible in this timeframe     Discussed with Attending Dr. Mellody Drown, MD  [name], MD, 06/21/2023    Resident Physician

## 2023-06-22 ENCOUNTER — Telehealth (HOSPITAL_BASED_OUTPATIENT_CLINIC_OR_DEPARTMENT_OTHER): Payer: Self-pay

## 2023-06-24 NOTE — Telephone Encounter (Signed)
Cheryl Zimmerman, Fernando  [facility]2 days ago     FD  Pt states that she is switching providers and requested to call her new one regarding medical records and medical conditions:  Otelia Limes [facility] -  [phone]  Please proceed with request.       Planned Care Outreach  Call made to Barth Kirks to coordinate care  on regards of transferring to another provider  . No interpreter needed.   Patient (self) answered at  [phone]  ; patient stated that will calling on Monday because is the weekend.  I have completed the following communication and reminder steps: Patient notified by telephone  Norville Haggard, 06/24/2023      On behalf of PCP: [name], MD

## 2023-06-26 MED ORDER — BUMETANIDE 2 MG PO TABS
4.0000 mg | ORAL_TABLET | Freq: Every day | ORAL | 3 refills | Status: DC
Start: 2023-06-26 — End: 2023-10-25

## 2023-06-26 MED ORDER — BUDESONIDE-FORMOTEROL FUMARATE 160-4.5 MCG/ACT IN AERO
2.0000 | INHALATION_SPRAY | Freq: Two times a day (BID) | RESPIRATORY_TRACT | 1 refills | Status: DC
Start: 2023-06-26 — End: 2023-08-22

## 2023-06-26 MED ORDER — MAGNESIUM GLUCONATE 500 MG PO TABS
500.0000 mg | ORAL_TABLET | Freq: Two times a day (BID) | ORAL | 1 refills | Status: DC
Start: 2023-06-26 — End: 2023-08-22

## 2023-06-26 MED ORDER — DULOXETINE HCL 30 MG PO CPEP
30.0000 mg | ORAL_CAPSULE | Freq: Every day | ORAL | 2 refills | Status: DC
Start: 2023-06-26 — End: 2023-10-25

## 2023-06-26 MED ORDER — SPIRONOLACTONE 100 MG PO TABS
100.0000 mg | ORAL_TABLET | Freq: Every day | ORAL | 3 refills | Status: DC
Start: 2023-06-26 — End: 2023-10-25

## 2023-06-26 MED ORDER — LEVOTHYROXINE SODIUM 25 MCG PO TABS
25.0000 ug | ORAL_TABLET | Freq: Every morning | ORAL | 3 refills | Status: DC
Start: 2023-06-26 — End: 2023-10-25

## 2023-06-26 MED ORDER — FLUTICASONE PROPIONATE 50 MCG/ACT NA SUSP
2.0000 | Freq: Two times a day (BID) | NASAL | 0 refills | Status: DC
Start: 2023-06-26 — End: 2023-07-24

## 2023-06-26 MED ORDER — METHOCARBAMOL 500 MG PO TABS
500.0000 mg | ORAL_TABLET | Freq: Four times a day (QID) | ORAL | 0 refills | Status: DC
Start: 2023-06-26 — End: 2023-07-19

## 2023-06-26 MED ORDER — LEVETIRACETAM 750 MG PO TABS
750.0000 mg | ORAL_TABLET | Freq: Two times a day (BID) | ORAL | 3 refills | Status: DC
Start: 2023-06-26 — End: 2023-10-25

## 2023-06-26 MED ORDER — BUPRENORPHINE HCL-NALOXONE HCL 8-2 MG SL FILM
1.00 | ORAL_FILM | Freq: Three times a day (TID) | SUBLINGUAL | 0 refills | Status: AC
Start: 2023-07-12 — End: 2023-08-11

## 2023-06-26 MED ORDER — BACLOFEN 10 MG PO TABS
10.0000 mg | ORAL_TABLET | Freq: Every day | ORAL | 0 refills | Status: DC | PRN
Start: 2023-06-26 — End: 2023-09-14

## 2023-06-26 MED ORDER — PRAZOSIN HCL 1 MG PO CAPS
1.0000 mg | ORAL_CAPSULE | Freq: Every evening | ORAL | 0 refills | Status: DC
Start: 2023-06-26 — End: 2023-10-25

## 2023-06-26 MED ORDER — ATORVASTATIN CALCIUM 80 MG PO TABS
80.0000 mg | ORAL_TABLET | Freq: Every day | ORAL | 3 refills | Status: DC
Start: 2023-06-26 — End: 2023-10-25

## 2023-06-29 ENCOUNTER — Telehealth (HOSPITAL_BASED_OUTPATIENT_CLINIC_OR_DEPARTMENT_OTHER): Payer: Self-pay

## 2023-06-29 ENCOUNTER — Other Ambulatory Visit (HOSPITAL_BASED_OUTPATIENT_CLINIC_OR_DEPARTMENT_OTHER): Payer: Self-pay

## 2023-06-29 NOTE — Telephone Encounter (Signed)
PER Pharmacy, Cheryl Zimmerman is a 52 year old female has requested a refill of melatonin.      Last OFFICE/TELE Visit:  06/21/23 with dwyer, i      Last Physical Exam:    There are no preventive care reminders to display for this patient.    Other Med Adult:  Most Recent BP Reading(s)  06/02/23 : (!) 157/100        Cholesterol (mg/dL)   Date Value   95/62/1308 263 (H)     LOW DENSITY LIPOPROTEIN DIRECT (mg/dL)   Date Value   65/78/4696 192 (H)     HIGH DENSITY LIPOPROTEIN (mg/dL)   Date Value   29/52/8413 40     TRIGLYCERIDES (mg/dL)   Date Value   24/40/1027 301 (H)         THYROID SCREEN TSH REFLEX FT4 (uIU/mL)   Date Value   05/31/2023 2.110         TSH (THYROID STIM HORMONE) (uIU/mL)   Date Value   12/10/2022 1.190       HEMOGLOBIN A1C (%)   Date Value   07/15/2022 6.5 (H)       No results found for: "POCA1C"      INR (no units)   Date Value   06/02/2011 1.0 (L)   06/02/2011 < 1.0 (*L)     INR CARE EVERYWHERE (no units)   Date Value   10/16/2021 1.0   09/23/2021 0.9   01/06/2021 1.1   11/11/2020 1.0   07/05/2020 0.9   07/04/2020 0.8 (L)   03/13/2020 1.0       SODIUM (mmol/L)   Date Value   05/31/2023 138       POTASSIUM (mmol/L)   Date Value   05/31/2023 4.0           CREATININE (mg/dL)   Date Value   25/36/6440 0.8       Documented patient preferred pharmacies:    [id number] [place], [place] - [address]  Phone: [phone] Fax: [phone]

## 2023-06-29 NOTE — Progress Notes (Signed)
PRECEPTOR NOTE  I evaluated patient on 06/21/23. I reviewed findings with resident    I confirm the key elements of history and physical exam as described resident   's note.   I agree with the assessment and plan as described below.  Please see resident   's note for further details.    Electronically signed by: Francene Castle, MD , 06/29/2023 10:34 PM

## 2023-06-29 NOTE — Telephone Encounter (Signed)
Receiving incoming call from patient stating [facility] be [facility] twice daily, not once dialy. Patient requesting [facility]    Please review pended RX if appropriate. Thank you

## 2023-07-18 ENCOUNTER — Other Ambulatory Visit (HOSPITAL_BASED_OUTPATIENT_CLINIC_OR_DEPARTMENT_OTHER): Payer: Self-pay

## 2023-07-18 NOTE — Telephone Encounter (Signed)
PER Pharmacy, Cheryl Zimmerman is a 52 year old female has requested a refill of 2 PENDED ORDERS.      Last OFFICE/TELE Visit:  06/21/23 with PCP    There are no preventive care reminders to display for this patient.    Other Med Adult:  Most Recent BP Reading(s)  06/02/23 : (!) 157/100        Cholesterol (mg/dL)   Date Value   16/06/9603 263 (H)     LOW DENSITY LIPOPROTEIN DIRECT (mg/dL)   Date Value   54/05/8118 192 (H)     HIGH DENSITY LIPOPROTEIN (mg/dL)   Date Value   14/78/2956 40     TRIGLYCERIDES (mg/dL)   Date Value   21/30/8657 301 (H)         THYROID SCREEN TSH REFLEX FT4 (uIU/mL)   Date Value   05/31/2023 2.110         TSH (THYROID STIM HORMONE) (uIU/mL)   Date Value   12/10/2022 1.190       HEMOGLOBIN A1C (%)   Date Value   07/15/2022 6.5 (H)       No results found for: "POCA1C"      INR (no units)   Date Value   06/02/2011 1.0 (L)   06/02/2011 < 1.0 (*L)     INR CARE EVERYWHERE (no units)   Date Value   10/16/2021 1.0   09/23/2021 0.9   01/06/2021 1.1   11/11/2020 1.0   07/05/2020 0.9   07/04/2020 0.8 (L)   03/13/2020 1.0       SODIUM (mmol/L)   Date Value   05/31/2023 138       POTASSIUM (mmol/L)   Date Value   05/31/2023 4.0           CREATININE (mg/dL)   Date Value   84/69/6295 0.8

## 2023-07-19 ENCOUNTER — Other Ambulatory Visit (HOSPITAL_BASED_OUTPATIENT_CLINIC_OR_DEPARTMENT_OTHER): Payer: Self-pay

## 2023-07-24 ENCOUNTER — Other Ambulatory Visit (HOSPITAL_BASED_OUTPATIENT_CLINIC_OR_DEPARTMENT_OTHER): Payer: Self-pay

## 2023-07-24 ENCOUNTER — Ambulatory Visit: Payer: No Typology Code available for payment source

## 2023-07-24 NOTE — Telephone Encounter (Signed)
PER Pharmacy, Cheryl Zimmerman is a 52 year old female has requested a refill of flonase nasal spray.      Last OFFICE/TELE Visit:  06-21-23 WITH pcp  Last Physical Exam:   Not on file.     There are no preventive care reminders to display for this patient.    Other Med Adult:  Most Recent BP Reading(s)  06/02/23 : (!) 157/100        Cholesterol (mg/dL)   Date Value   45/40/9811 263 (H)     LOW DENSITY LIPOPROTEIN DIRECT (mg/dL)   Date Value   91/47/8295 192 (H)     HIGH DENSITY LIPOPROTEIN (mg/dL)   Date Value   62/13/0865 40     TRIGLYCERIDES (mg/dL)   Date Value   78/46/9629 301 (H)         THYROID SCREEN TSH REFLEX FT4 (uIU/mL)   Date Value   05/31/2023 2.110         TSH (THYROID STIM HORMONE) (uIU/mL)   Date Value   12/10/2022 1.190       HEMOGLOBIN A1C (%)   Date Value   07/15/2022 6.5 (H)       No results found for: "POCA1C"      INR (no units)   Date Value   06/02/2011 1.0 (L)   06/02/2011 < 1.0 (*L)     INR CARE EVERYWHERE (no units)   Date Value   10/16/2021 1.0   09/23/2021 0.9   01/06/2021 1.1   11/11/2020 1.0   07/05/2020 0.9   07/04/2020 0.8 (L)   03/13/2020 1.0       SODIUM (mmol/L)   Date Value   05/31/2023 138       POTASSIUM (mmol/L)   Date Value   05/31/2023 4.0           CREATININE (mg/dL)   Date Value   52/84/1324 0.8       Documented patient preferred pharmacies:    [id number] [place], [place] - [address]  Phone: [phone] Fax: [phone]

## 2023-07-26 ENCOUNTER — Ambulatory Visit (HOSPITAL_BASED_OUTPATIENT_CLINIC_OR_DEPARTMENT_OTHER): Payer: No Typology Code available for payment source

## 2023-07-26 NOTE — Progress Notes (Deleted)
RESIDENT PRIMARY CARE NOTE       SUBJECTIVE  Cheryl Zimmerman is a 52 year old English-speaking female here for:    Chief concern(s): No chief complaint on file.     #[facility] worker mised appt    #green ear discharge  -R ear  -has had surgeries on ear?  -resistant to amox?  -all benign, all cholesteatoma  -put tissue in ear, green on tissue  -painful, skin right around, not painful inside ear      ROS - Pertinent symptoms as noted above, other ROS negative    Active Medical Issues:  Patient Active Problem List:     Dyspnea on minimal exertion     Suicidal behavior with attempted self-injury ([name])     Tobacco use disorder     Systolic congestive heart failure ([name])     Seizure disorder ([name])     Depressive disorder     Suicidal ideation     Afib ([name])     Alcohol use disorder     Borderline personality disorder ([name])     Chronic pain disorder     Morbid obesity ([name])     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)     PTSD (post-traumatic stress disorder)     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     (HFpEF) heart failure with preserved ejection fraction ([name])     MDD (major depressive disorder), recurrent, severe, with psychosis ([name])     Requires continuous at home supplemental oxygen     Spinal stenosis of lumbar 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 claudication     Fall in home     Gait instability     Hypokalemia     Lung nodule     Stable angina ([name])     Chest pain, atypical     Obesity hypoventilation syndrome ([name])     Osteoarthritis of spine with radiculopathy, lumbar region     Chronic bilateral low back pain with bilateral sciatica     Hypoxia     Alcohol withdrawal syndrome, uncomplicated ([name])     Cocaine use     Homicidal ideation     Somnolence     Obstructive sleep apnea syndrome     Acute respiratory failure with hypoxia and hypercapnia ([name])     Acute metabolic encephalopathy     B12 deficiency     Vitamin D deficiency      Past medical, surgical, and social history reviewed.       OBJECTIVE  There were no vitals  taken for this visit.  Most Recent Weight Reading(s)  05/29/23 : (!) 141.1 kg (311 lb 1.1 oz)  02/01/23 : (!) 146 kg (321 lb 14 oz)  12/31/22 : (!) 155.2 kg (342 lb 1.6 oz)  12/11/22 : (!) 158.8 kg (350 lb)  12/10/22 : (!) 158.8 kg (350 lb)                Most Recent BP Reading(s)  06/02/23 : (!) 157/100  02/01/23 : 117/79  12/31/22 : 111/70  12/14/22 : 115/68  12/10/22 : 110/75      Physical Exam      I personally reviewed relevant labs, imaging, and studies in EpicCare.       ASSESSMENT AND PLAN  Cheryl Zimmerman is a 52 year old female who presents with ***     (Associate diagnoses for visit then pull in .DIAG to document A/P or .PROBDIAG if problem-based charting)    Follow up  in approximately ***    {Document time spent to support E&M coding:12984}  ____________    Discussed with Attending Dr. [name], MD, 07/26/2023    Resident Physician     ***Update HM, refresh all

## 2023-07-27 ENCOUNTER — Telehealth (HOSPITAL_BASED_OUTPATIENT_CLINIC_OR_DEPARTMENT_OTHER): Payer: Self-pay

## 2023-07-27 ENCOUNTER — Ambulatory Visit (HOSPITAL_BASED_OUTPATIENT_CLINIC_OR_DEPARTMENT_OTHER): Payer: Self-pay

## 2023-07-27 NOTE — Telephone Encounter (Signed)
Reason for Disposition  . General information question, no triage required and triager able to answer question    Answer Assessment - Initial Assessment Questions  Pt calling to info pcp that she has new phone number in chart updated.    Protocols used: Information Only Call - No Triage-A-OH

## 2023-07-27 NOTE — Telephone Encounter (Signed)
Called pt, no response x 2, left VM

## 2023-08-04 ENCOUNTER — Telehealth (HOSPITAL_BASED_OUTPATIENT_CLINIC_OR_DEPARTMENT_OTHER): Payer: Self-pay

## 2023-08-04 DIAGNOSIS — Z09 Encounter for follow-up examination after completed treatment for conditions other than malignant neoplasm: Secondary | ICD-10-CM

## 2023-08-04 NOTE — Telephone Encounter (Signed)
Cheryl Agee, RN, 08/04/2023  Patient calling to schedule an emergency appointment with Dr. Otilio Miu to review CT results, states she has a tumor behind her ear  States she was transferred to this line to schedule an appointment  Attempted to gather information from patient and voiced frustration  Patient ended call abruptly

## 2023-08-04 NOTE — Telephone Encounter (Signed)
Attempted X 2 to call patient per message below.  No answer. VM left to call back.  Nyra Jabs, RN 08/04/23 3:21 PM

## 2023-08-04 NOTE — Telephone Encounter (Signed)
Verified patient Cheryl Zimmerman DOB April 30, 1971 on 08/04/23 by Nonda Lou RN, BSN          Need post discharge appointment. Hospital admission [facility] Nurse Case manager calling. November 12-14th.Discharged home. Looking to scheduled post discharge follow up and concerns tumor of the ear have returned.   Case Manger [facility]     Pleas contact [phone]

## 2023-08-07 NOTE — Telephone Encounter (Addendum)
TC to pt re: hospital discharge follow-up / schedule an appt     Pt contacted and identified. Ashby Dawes of call discussed as requested.   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   "I'm on Cipro ear drops and ABX oral for L ear infection."   "[facility] f/up appt is not scheduled; I will make one today."   "Using med for pain; Tylenol #3 w/ codeine helps the pain."   "It helps with the fever too, I was running a fever of 102.0."  "Did CT with contrast to ear, cultures, and blood work."   "Has to have tumor surgically removed."  "I only want to see Dr Otilio Miu. I have to discuss med's he prescribed before."    DISPO: requests TV only; does not have access to transportation. Next appt 12/16; pt requests to schedule on this date. "I can wait." Reminded of the importance to follow up with [facility] today to schedule necessary follow up. Pt aware to contact the clinic or seek emergency care for worsening/worrisome symptoms and not to wait until pt speaks to PCP. Again refused sooner f/up with a team provider. Reasons to return to the ED or call were reviewed in detail. Pt agrees with this plan and disposition and has no further questions or concerns at this time. Discharge questionnaire completed.   Naszir Cott Colon-Guerrero, RN, 08/07/2023

## 2023-08-09 ENCOUNTER — Ambulatory Visit: Payer: No Typology Code available for payment source | Admitting: Family

## 2023-08-09 ENCOUNTER — Ambulatory Visit (HOSPITAL_BASED_OUTPATIENT_CLINIC_OR_DEPARTMENT_OTHER): Payer: Self-pay

## 2023-08-09 DIAGNOSIS — R21 Rash and other nonspecific skin eruption: Secondary | ICD-10-CM | POA: Diagnosis not present

## 2023-08-09 MED ORDER — KETOCONAZOLE 2 % EX CREA
TOPICAL_CREAM | Freq: Every day | CUTANEOUS | 0 refills | Status: AC
Start: 2023-08-09 — End: 2023-08-23

## 2023-08-09 NOTE — Telephone Encounter (Signed)
Reason for Disposition   Localized rash present > 7 days    Answer Assessment - Initial Assessment Questions  Pt calling  She has a fungal rash under her left breast  Pt using medicated goldbond powder  Has not used any antifungal over the counter  Area is sore and itchy  Televisit appt made for eval    Protocols used: Rash or Redness - Localized-A-OH  Advised per nursing triage protocol.  Verbalized understanding and agreement with instructions and disposition.     Recommended disposition for patient:Disposition: Scheduled Televisit Appointment    If patient referred to UC/ED advised that they may require further follow up and testing after the visit with their primary care office.     Instructed patient to call back for any new, worsening, or worrisome symptoms or concerns any time day or night.

## 2023-08-09 NOTE — Progress Notes (Signed)
Cheryl Zimmerman is a 52 year old female patient of [name], MD who is scheduled for a telemedicine visit for Rash.    SUBJECTIVE:    X 4 days ago   Developed a rash   Right side of breast, fold  Area is red and irritated   Reports sweating often   Occasionally itchy  Has had a similar rash in the past, treated with antifungals   No Drainage, crusting, warmth to touch   No new medications/foods/soaps/detergents etc.   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     (HFpEF) heart failure with preserved ejection fraction ([name])     MDD (major depressive disorder), recurrent, severe, with psychosis ([name])     Requires continuous at home supplemental oxygen     Spinal stenosis of lumbar 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 claudication     Fall in home     Gait instability     Hypokalemia     Lung nodule     Stable angina ([name])     Chest pain, atypical     Obesity hypoventilation syndrome ([name])     Osteoarthritis of spine with radiculopathy, lumbar region     Chronic bilateral low back pain with bilateral sciatica     Hypoxia     Alcohol withdrawal syndrome, uncomplicated ([name])     Cocaine use     Homicidal ideation     Somnolence     Obstructive sleep apnea syndrome     Acute respiratory failure with hypoxia and hypercapnia ([name])     Acute metabolic encephalopathy     B12 deficiency     Vitamin D deficiency    fluticasone (FLONASE) 50 MCG/ACT nasal spray, TAKE 2 SPRAYS BY EACH  NOSTRIL ROUTE IN THE MORNING AND 2 SPRAYS BEFORE BEDTIME., Disp: 16 mL, Rfl: 0  gabapentin (NEURONTIN) 600 MG tablet, Take 1 tablet by mouth 3 (three) times daily, Disp: 90 tablet, Rfl: 1  methocarbamol (ROBAXIN) 500 MG tablet, TAKE 1 TABLET BY MOUTH IN THE MORNING AT NOON IN THE EVENING AND AT BEDTIME FOR 10 DAYS, Disp: 120 tablet, Rfl: 0  bumetanide (BUMEX) 2 MG tablet, Take 2 tablets by mouth in the morning., Disp: 60 tablet, Rfl: 3  buprenorphine-naloxone (SUBOXONE) 8-2 MG sublingual film, Place 1 Film under the tongue in the morning and 1 Film at noon and 1 Film before bedtime. Max Daily Amount: 3 Film. Do all this for 90 doses., Disp: 90 Film, Rfl: 0  atorvastatin (LIPITOR) 80 MG tablet, Take 1 tablet by mouth in the morning., Disp: 90 tablet, Rfl: 3  DULoxetine (CYMBALTA) 30 MG capsule, Take 1 capsule by mouth in the morning., Disp: 30 capsule, Rfl: 2  spironolactone (ALDACTONE) 100 MG tablet, Take 1 tablet by  mouth in the morning., Disp: 90 tablet, Rfl: 3  baclofen (LIORESAL) 10 MG tablet, Take 1 tablet by mouth daily as needed, Disp: 60 tablet, Rfl: 0  budesonide-formoterol (SYMBICORT) 160-4.5 MCG/ACT inhaler, Inhale 2 puffs into the lungs in the morning and 2 puffs before bedtime., Disp: 1 each, Rfl: 1  levothyroxine (SYNTHROID) 25 MCG tablet, Take 1 tablet by mouth every morning before breakfast, Disp: 90 tablet, Rfl: 3  levETIRAcetam (KEPPRA) 750 MG tablet, Take 1 tablet by mouth in the morning and 1 tablet before bedtime., Disp: 180 tablet, Rfl: 3  prazosin (MINIPRESS) 1 MG capsule, Take 1 capsule by mouth nightly, Disp: 30 capsule, Rfl: 0  magnesium gluconate (MAGONATE) 500 MG tablet, Take 1 tablet by mouth in the morning and 1 tablet before bedtime., Disp: 60 tablet, Rfl: 1  nicotine (NICODERM CQ) 21 MG/24HR, APPLY 1 PATCH ONTO THE SKIN IN THE MORNING, Disp: 42 patch, Rfl: 2  pantoprazole (PROTONIX) 40 MG tablet, Take 1 tablet by mouth in the morning., Disp: 90 tablet, Rfl: 0  cholecalciferol (VITAMIN  D3) 25 MCG (1000 UT) tablet, Take 1 tablet by mouth in the morning., Disp: 30 tablet, Rfl: 1  diphenhydrAMINE (BENADRYL) 25 MG capsule, Take 25 mg by mouth every 6 (six) hours as needed for Itching, Disp: , Rfl:   lactulose (CHRONULAC) 20 GM/30 ML solution, Take 30 g by mouth as needed, Disp: , Rfl:   OXcarbazepine (TRILEPTAL) 150 MG tablet, TAKE 1 TABLET BY MOUTH EVERY MORNING AND 1 TABLET BEFORE BEDTIME, Disp: 60 tablet, Rfl: 1  sucralfate (CARAFATE) 1 GM tablet, Take 1 tablet by mouth in the morning and 1 tablet at noon and 1 tablet in the evening and 1 tablet before bedtime., Disp: 360 tablet, Rfl: 3  omeprazole (PRILOSEC) 40 MG capsule, Take 1 capsule by mouth in the morning., Disp: 90 capsule, Rfl: 3  polyethylene glycol (GLYCOLAX/MIRALAX) 17 g packet, Take 1 packet by mouth in the morning., Disp: 90 packet, Rfl: 3  Multiple Vitamin (MULTIVITAMIN) tablet, Take 1 tablet by mouth in the morning., Disp: 90 tablet, Rfl: 3  nicotine polacrilex (NICORETTE) 4 MG gum, Take 1 each by mouth as needed for Craving (Nicotine), Disp: 100 tablet, Rfl: 11  acetaminophen (TYLENOL) 325 MG tablet, Take 2 tablets by mouth every 6 (six) hours as needed for Pain, Disp: , Rfl:     No current facility-administered medications on file prior to visit.    All medications reviewed with patient.  Review of Patient's Allergies indicates:   Bee venom               Anaphylaxis   Metolazone              Other (See Comments)    Comment:Hypokalemia   Trazodone               Hives   Methylprednisolone      Dizziness, Drowsiness    Comment:Also believes syncope. Seems to tolerate             inhalational and epidural steroid.   Nutritional supplem*       Tegretol [carbamaze*    Hives   Hydroxyzine             Nausea Only  Social History    Tobacco Use      Smoking status: Every Day        Packs/day: 3.00        Years: 3.0 packs/day for 30.0 years (90.0 ttl pk-yrs)  Types: Cigarettes        Passive exposure: Current      Smokeless tobacco:  Never      Tobacco comments: Pt currently on nicotine patch and gum    Alcohol use: Yes      Comment: 2 liters of captain morgan daily    Drug use: Yes      Types: Marijuana, Cocaine    Medical History reviewed with the patient.  Relevant changes have been made in 'history' section.    Recent laboratories and imaging reviewed prior to visit.    Review of Systems/HPI:  All other systems reviewed and negative.    OBJECTIVE:    Vital Signs:  Vitals not collected for this telemedicine visit.    Physical Exam:  General: speech clear at appropriate rate, speaking in full sentences  Psychiatric:  Mood and behavior normal     ASSESSMENT AND PLAN:  Additional plans reviewed with patient and listed below under patient instructions and provided in AVS.    1. Rash  Suspect fungal infection   Rx'd ketoconazole   Side effects and risks reviewed with patient/ parent/ guardian.    Discussed pathophysiology of tinea infection    Encouraged keep are clean and dry, avid tight fitting clothing.   Pt to follow up if symptoms worsen or do not improve        - ketoconazole (NIZORAL) 2 % cream; Apply topically daily  for 14 days  Dispense: 30 g; Refill: 0        Reasons to call or return to clinic were discussed.    I explained the diagnosis and treatment plan, and the patient/parent/guardian expressed understanding of the content. We discussed all medicines prescribed and the importance of medication adherence. The patient/parent/guardian expressed understanding and no barriers to adherence were identified.  Possible side effects of the prescribed medication(s) were explained.  I attempted to answer all questions regarding the diagnosis and the proposed treatment.    We discussed the patient's current medications including proper use and potential side effects. The patient expressed understanding and no barriers to adherence were identified.     1. The patient indicates understanding of these issues and agrees with the plan. Brief care  plan is updated and reviewed with the patient.   2. The patient is given an After Visit Summary sheet that lists all medications with directions, allergies, orders placed during this encounter, and follow-up instructions.   3. I reviewed the patient's medical information and medical history   4. I have reviewed the pertinent medical, family, and social history sections including the medications and allergies.    Ladona Ridgel, NP

## 2023-08-19 ENCOUNTER — Other Ambulatory Visit (HOSPITAL_BASED_OUTPATIENT_CLINIC_OR_DEPARTMENT_OTHER): Payer: Self-pay

## 2023-08-19 NOTE — Telephone Encounter (Signed)
PER Pharmacy, Cheryl Zimmerman is a 52 year old female has requested a refill of     Magnesium gluc 500mg .    Last OFFICE/TELE Visit:  08/09/2023 [name], E     Last Physical Exam: Not on file.     There are no preventive care reminders to display for this patient.    Other Med Adult:  Most Recent BP Reading(s)  06/02/23 : (!) 157/100        Cholesterol (mg/dL)   Date Value   32/35/5732 263 (H)     LOW DENSITY LIPOPROTEIN DIRECT (mg/dL)   Date Value   20/25/4270 192 (H)     HIGH DENSITY LIPOPROTEIN (mg/dL)   Date Value   62/37/6283 40     TRIGLYCERIDES (mg/dL)   Date Value   15/17/6160 301 (H)         THYROID SCREEN TSH REFLEX FT4 (uIU/mL)   Date Value   05/31/2023 2.110         TSH (THYROID STIM HORMONE) (uIU/mL)   Date Value   12/10/2022 1.190       HEMOGLOBIN A1C (%)   Date Value   07/15/2022 6.5 (H)       No results found for: "POCA1C"      INR (no units)   Date Value   06/02/2011 1.0 (L)   06/02/2011 < 1.0 (*L)     INR CARE EVERYWHERE (no units)   Date Value   10/16/2021 1.0   09/23/2021 0.9   01/06/2021 1.1   11/11/2020 1.0   07/05/2020 0.9   07/04/2020 0.8 (L)   03/13/2020 1.0       SODIUM (mmol/L)   Date Value   05/31/2023 138       POTASSIUM (mmol/L)   Date Value   05/31/2023 4.0           CREATININE (mg/dL)   Date Value   73/71/0626 0.8       Documented patient preferred pharmacies:    [id number] [place], [place] - [address]  Phone: [phone] Fax: [phone]

## 2023-08-20 NOTE — Telephone Encounter (Signed)
.  rxre

## 2023-08-20 NOTE — Telephone Encounter (Signed)
PER Pharmacy, Cheryl Zimmerman is a 52 year old female has requested a refill of     budesonide-formoterol (SYMBICORT) 160-4.5 MCG/ACT inhaler       Last OFFICE/TELE Visit:  08/09/23  with [name]      Last Physical Exam:   Not on file.     There are no preventive care reminders to display for this patient.    Other Med Adult:  Most Recent BP Reading(s)  06/02/23 : (!) 157/100        Cholesterol (mg/dL)   Date Value   16/06/9603 263 (H)     LOW DENSITY LIPOPROTEIN DIRECT (mg/dL)   Date Value   54/05/8118 192 (H)     HIGH DENSITY LIPOPROTEIN (mg/dL)   Date Value   14/78/2956 40     TRIGLYCERIDES (mg/dL)   Date Value   21/30/8657 301 (H)         THYROID SCREEN TSH REFLEX FT4 (uIU/mL)   Date Value   05/31/2023 2.110         TSH (THYROID STIM HORMONE) (uIU/mL)   Date Value   12/10/2022 1.190       HEMOGLOBIN A1C (%)   Date Value   07/15/2022 6.5 (H)       No results found for: "POCA1C"      INR (no units)   Date Value   06/02/2011 1.0 (L)   06/02/2011 < 1.0 (*L)     INR CARE EVERYWHERE (no units)   Date Value   10/16/2021 1.0   09/23/2021 0.9   01/06/2021 1.1   11/11/2020 1.0   07/05/2020 0.9   07/04/2020 0.8 (L)   03/13/2020 1.0       SODIUM (mmol/L)   Date Value   05/31/2023 138       POTASSIUM (mmol/L)   Date Value   05/31/2023 4.0           CREATININE (mg/dL)   Date Value   84/69/6295 0.8       Documented patient preferred pharmacies:    [id number] [place], [place] - [address]  Phone: [phone] Fax: [phone]

## 2023-08-27 ENCOUNTER — Other Ambulatory Visit (HOSPITAL_BASED_OUTPATIENT_CLINIC_OR_DEPARTMENT_OTHER): Payer: Self-pay

## 2023-08-27 NOTE — Telephone Encounter (Signed)
PER Pharmacy, Cheryl Zimmerman is a 52 year old female has requested a refill of fluticasone nasal spray.      Last Office Visit:  06/21/23 with Otilio Miu, I  Last Physical Exam: N/A    There are no preventive care reminders to display for this patient.    Other Med Adult:  Most Recent BP Reading(s)  06/02/23 : (!) 157/100        Cholesterol (mg/dL)   Date Value   95/28/4132 263 (H)     LOW DENSITY LIPOPROTEIN DIRECT (mg/dL)   Date Value   44/09/270 192 (H)     HIGH DENSITY LIPOPROTEIN (mg/dL)   Date Value   53/66/4403 40     TRIGLYCERIDES (mg/dL)   Date Value   47/42/5956 301 (H)         THYROID SCREEN TSH REFLEX FT4 (uIU/mL)   Date Value   05/31/2023 2.110         TSH (THYROID STIM HORMONE) (uIU/mL)   Date Value   12/10/2022 1.190       HEMOGLOBIN A1C (%)   Date Value   07/15/2022 6.5 (H)       No results found for: "POCA1C"      INR (no units)   Date Value   06/02/2011 1.0 (L)   06/02/2011 < 1.0 (*L)     INR CARE EVERYWHERE (no units)   Date Value   10/16/2021 1.0   09/23/2021 0.9   01/06/2021 1.1   11/11/2020 1.0   07/05/2020 0.9   07/04/2020 0.8 (L)   03/13/2020 1.0       SODIUM (mmol/L)   Date Value   05/31/2023 138       POTASSIUM (mmol/L)   Date Value   05/31/2023 4.0           CREATININE (mg/dL)   Date Value   38/75/6433 0.8       Documented patient preferred pharmacies:    [id number] [place] [facility], [place] - [address]  Phone: [phone] Fax: [phone]

## 2023-08-30 ENCOUNTER — Encounter (HOSPITAL_BASED_OUTPATIENT_CLINIC_OR_DEPARTMENT_OTHER): Payer: Self-pay

## 2023-09-04 ENCOUNTER — Ambulatory Visit: Payer: No Typology Code available for payment source

## 2023-09-07 ENCOUNTER — Other Ambulatory Visit (HOSPITAL_BASED_OUTPATIENT_CLINIC_OR_DEPARTMENT_OTHER): Payer: Self-pay

## 2023-09-08 NOTE — Telephone Encounter (Signed)
PER Pharmacy, Cheryl Zimmerman is a 52 year old female has requested a refill of   baclofen (LIORESAL) 10 MG tablet .    Last Office Visit: 08/09/2023    Other Med Adult:  Most Recent BP Reading(s)  06/02/23 : Marland Kitchen 157/100        Cholesterol (mg/dL)   Date Value   16/06/9603 263 (H)     LOW DENSITY LIPOPROTEIN DIRECT (mg/dL)   Date Value   54/05/8118 192 (H)     HIGH DENSITY LIPOPROTEIN (mg/dL)   Date Value   14/78/2956 40     TRIGLYCERIDES (mg/dL)   Date Value   21/30/8657 301 (H)         THYROID SCREEN TSH REFLEX FT4 (uIU/mL)   Date Value   05/31/2023 2.110         TSH (THYROID STIM HORMONE) (uIU/mL)   Date Value   12/10/2022 1.190       HEMOGLOBIN A1C (%)   Date Value   07/15/2022 6.5 (H)       No results found for: "POCA1C"      INR (no units)   Date Value   06/02/2011 1.0 (L)   06/02/2011 < 1.0 (*L)     INR CARE EVERYWHERE (no units)   Date Value   10/16/2021 1.0   09/23/2021 0.9   01/06/2021 1.1   11/11/2020 1.0   07/05/2020 0.9   07/04/2020 0.8 (L)   03/13/2020 1.0       SODIUM (mmol/L)   Date Value   05/31/2023 138       POTASSIUM (mmol/L)   Date Value   05/31/2023 4.0           CREATININE (mg/dL)   Date Value   84/69/6295 0.8       Documented patient preferred pharmacies:    [id number] [place], [place] - [address]  Phone: [phone] Fax: [phone]

## 2023-09-11 ENCOUNTER — Telehealth (HOSPITAL_BASED_OUTPATIENT_CLINIC_OR_DEPARTMENT_OTHER): Payer: Self-pay

## 2023-09-11 NOTE — Telephone Encounter (Signed)
 [facility]  Call made to Cheryl Zimmerman to schedule an appointment    . No interpreter needed.   Patient (self) answered at [phone] ; appointment booked.  I have completed the following communication and reminder steps: Patient notified by telephone  [name], 09/11/2023    On behalf of PCP: [name], MD

## 2023-09-17 ENCOUNTER — Encounter (HOSPITAL_BASED_OUTPATIENT_CLINIC_OR_DEPARTMENT_OTHER): Payer: Self-pay

## 2023-09-21 ENCOUNTER — Other Ambulatory Visit (HOSPITAL_BASED_OUTPATIENT_CLINIC_OR_DEPARTMENT_OTHER): Payer: Self-pay

## 2023-09-21 NOTE — Telephone Encounter (Signed)
Hello,  [facility] received a request for [employer].      The process for ordering a POV (power operated vehicle)  is to either put in a referral to Physical Therapy who will do the assessment (be sure to include "electric wheelchair/scooter assessment" in the referral) OR refer patient directly to [employer] and Mobility at [phone] to find where their nearest wheelchair clinic is and they will send [place] an DWO for appropriate equipment. Patient will also need an in-person mobility assessment with provider (please use DMEPOV smartphrase when documenting this) and a signed RX (pended below)     Thank you      - no previous RX received

## 2023-09-21 NOTE — Telephone Encounter (Signed)
New script  for [employer] Chair  Received: 3 days ago  Budwal, Inderjit  C [facility]ME[facility]armacy [facility]  Hi,    Patient requesting the new script to send to her medical supply store. Patient said the previous script was lost. Please review it.    Thanks

## 2023-09-22 ENCOUNTER — Other Ambulatory Visit (HOSPITAL_BASED_OUTPATIENT_CLINIC_OR_DEPARTMENT_OTHER): Payer: Self-pay

## 2023-09-22 NOTE — Telephone Encounter (Signed)
PER Pharmacy, Cheryl Zimmerman is a 53 year old female has requested a refill of      -  pantoprazole       Last Office Visit: 08/09/23 with aduba, e.       There are no preventive care reminders to display for this patient.     Other Med Adult:  Most Recent BP Reading(s)  06/02/23 : (!) 157/100        Cholesterol (mg/dL)   Date Value   35/57/3220 263 (H)     LOW DENSITY LIPOPROTEIN DIRECT (mg/dL)   Date Value   25/42/7062 192 (H)     HIGH DENSITY LIPOPROTEIN (mg/dL)   Date Value   37/62/8315 40     TRIGLYCERIDES (mg/dL)   Date Value   17/61/6073 301 (H)         THYROID SCREEN TSH REFLEX FT4 (uIU/mL)   Date Value   05/31/2023 2.110         TSH (THYROID STIM HORMONE) (uIU/mL)   Date Value   12/10/2022 1.190       HEMOGLOBIN A1C (%)   Date Value   07/15/2022 6.5 (H)       No results found for: "POCA1C"      INR (no units)   Date Value   06/02/2011 1.0 (L)   06/02/2011 < 1.0 (*L)     INR CARE EVERYWHERE (no units)   Date Value   10/16/2021 1.0   09/23/2021 0.9   01/06/2021 1.1   11/11/2020 1.0   07/05/2020 0.9   07/04/2020 0.8 (L)   03/13/2020 1.0       SODIUM (mmol/L)   Date Value   05/31/2023 138       POTASSIUM (mmol/L)   Date Value   05/31/2023 4.0           CREATININE (mg/dL)   Date Value   71/02/2693 0.8        Documented patient preferred pharmacies:    [id number] [place], [place] - [address]  Phone: [phone] Fax: [phone]

## 2023-09-25 ENCOUNTER — Encounter (HOSPITAL_BASED_OUTPATIENT_CLINIC_OR_DEPARTMENT_OTHER): Payer: Self-pay

## 2023-09-29 ENCOUNTER — Other Ambulatory Visit (HOSPITAL_BASED_OUTPATIENT_CLINIC_OR_DEPARTMENT_OTHER): Payer: Self-pay

## 2023-09-29 NOTE — Telephone Encounter (Signed)
PER Pharmacy, Cheryl Zimmerman is a 53 year old female has requested a refill of methocarbamol 500 mg.      Last Office Visit: [id number] with Otilio Miu, I  Last Physical Exam: n-a      Other Med Adult:  Most Recent BP Reading(s)  06/02/23 : (!) 157/100        Cholesterol (mg/dL)   Date Value   86/57/8469 263 (H)     LOW DENSITY LIPOPROTEIN DIRECT (mg/dL)   Date Value   62/95/2841 192 (H)     HIGH DENSITY LIPOPROTEIN (mg/dL)   Date Value   32/44/0102 40     TRIGLYCERIDES (mg/dL)   Date Value   72/53/6644 301 (H)         THYROID SCREEN TSH REFLEX FT4 (uIU/mL)   Date Value   05/31/2023 2.110         TSH (THYROID STIM HORMONE) (uIU/mL)   Date Value   12/10/2022 1.190       HEMOGLOBIN A1C (%)   Date Value   07/15/2022 6.5 (H)       No results found for: "POCA1C"      INR (no units)   Date Value   06/02/2011 1.0 (L)   06/02/2011 < 1.0 (*L)     INR CARE EVERYWHERE (no units)   Date Value   10/16/2021 1.0   09/23/2021 0.9   01/06/2021 1.1   11/11/2020 1.0   07/05/2020 0.9   07/04/2020 0.8 (L)   03/13/2020 1.0       SODIUM (mmol/L)   Date Value   05/31/2023 138       POTASSIUM (mmol/L)   Date Value   05/31/2023 4.0           CREATININE (mg/dL)   Date Value   03/47/4259 0.8       Documented patient preferred pharmacies:    [id number], [place] - [address]  Phone: [phone] Fax: [phone]

## 2023-10-03 NOTE — Telephone Encounter (Signed)
FOR DME TEAM: Patient just need the RX sent - assessment was done already    Please send to [facility] - FAX# [phone]

## 2023-10-04 ENCOUNTER — Ambulatory Visit (HOSPITAL_BASED_OUTPATIENT_CLINIC_OR_DEPARTMENT_OTHER): Payer: Self-pay | Admitting: Ambulatory Care

## 2023-10-04 NOTE — Telephone Encounter (Signed)
Spoke w/ Patient   Confirmed pt's identity using 2 patient identifiers.    Pt requesting f/u televisit appt for medication  Pt seemingly slight upset when nurse asked questions about reason for appt and other details  Informed pt she already has a televisit as noted below.  Date & Time Provider Department Encounter #   10/16/2023 13:30 [name] [facility] [id number]      Reason For Scheduling    discharge follow up          NO TRIAGE INDICATED

## 2023-10-08 MED ORDER — OTHER MEDICATION
0 refills | Status: AC
Start: 2023-10-08 — End: 2024-09-20

## 2023-10-09 NOTE — Telephone Encounter (Signed)
[facility] received an order for electric wheelchair. This has been forwarded to [facility] and scanned into [employer] information officer. Due to COVID-19 [facility] is using provider's signature on file to submit DME requests.    Please note DME requests can take 4 to 6 weeks to receive a decision.       For any updates, patient should call supply company directly at [phone]

## 2023-10-13 ENCOUNTER — Other Ambulatory Visit (HOSPITAL_BASED_OUTPATIENT_CLINIC_OR_DEPARTMENT_OTHER): Payer: Self-pay

## 2023-10-13 ENCOUNTER — Telehealth (HOSPITAL_BASED_OUTPATIENT_CLINIC_OR_DEPARTMENT_OTHER): Payer: Self-pay

## 2023-10-13 NOTE — Telephone Encounter (Signed)
PER Patient (self),NAME@ is a 53 year old female has requested a refill of suboxone      Last prescribed - start date: 07/12/23 end date: 08/11/23     Last Office Visit: 08/09/23  Last Physical Exam: na  There are no preventive care reminders to display for this patient.     Other Med Adult:  Most Recent BP Reading(s)  06/02/23 : (!) 157/100        Cholesterol (mg/dL)   Date Value   16/06/9603 263 (H)     LOW DENSITY LIPOPROTEIN DIRECT (mg/dL)   Date Value   54/05/8118 192 (H)     HIGH DENSITY LIPOPROTEIN (mg/dL)   Date Value   14/78/2956 40     TRIGLYCERIDES (mg/dL)   Date Value   21/30/8657 301 (H)         THYROID SCREEN TSH REFLEX FT4 (uIU/mL)   Date Value   05/31/2023 2.110         TSH (THYROID STIM HORMONE) (uIU/mL)   Date Value   12/10/2022 1.190       HEMOGLOBIN A1C (%)   Date Value   07/15/2022 6.5 (H)       No results found for: "POCA1C"      INR (no units)   Date Value   06/02/2011 1.0 (L)   06/02/2011 < 1.0 (*L)     INR CARE EVERYWHERE (no units)   Date Value   10/16/2021 1.0   09/23/2021 0.9   01/06/2021 1.1   11/11/2020 1.0   07/05/2020 0.9   07/04/2020 0.8 (L)   03/13/2020 1.0       SODIUM (mmol/L)   Date Value   05/31/2023 138       POTASSIUM (mmol/L)   Date Value   05/31/2023 4.0           CREATININE (mg/dL)   Date Value   84/69/6295 0.8        Documented patient preferred pharmacies:    [id number], [place] - [address]  Phone: [phone] Fax: [phone]

## 2023-10-13 NOTE — Telephone Encounter (Signed)
Call placed to patient  Confirmed full name and DOB  Requests refills on Suboxone and Clonazepam  Was hospitalized and given Clonazepam 1 mg  "I need these medications"  Pt aware of her televisit with PCP this coming Monday  States "that is too long to wait" "I have been on Suboxone for 8 years"  Advised message will be routed to Dr Otilio Miu for review  Awaiting feedback  No further questions or concerns at this time      Susie Cassette, RN, 10/13/2023

## 2023-10-13 NOTE — Telephone Encounter (Signed)
Patient call today wanted to see if the prescriber can prescribe medication for the   clonazepam 1 mg ( 1 tab by mouth daily)    She is in looking for a new psych provider at the moment      Documented patient preferred pharmacies:    [id number], [place] - [address]  Phone: [phone] Fax: [phone]

## 2023-10-16 ENCOUNTER — Ambulatory Visit: Payer: No Typology Code available for payment source | Attending: Internal Medicine

## 2023-10-16 DIAGNOSIS — F1120 Opioid dependence, uncomplicated: Secondary | ICD-10-CM | POA: Insufficient documentation

## 2023-10-16 DIAGNOSIS — F251 Schizoaffective disorder, depressive type: Secondary | ICD-10-CM | POA: Diagnosis present

## 2023-10-16 MED ORDER — CLONAZEPAM 1 MG PO TABS
1.0000 mg | ORAL_TABLET | Freq: Every evening | ORAL | 0 refills | Status: DC | PRN
Start: 2023-10-16 — End: 2023-10-25

## 2023-10-16 MED ORDER — BUPRENORPHINE HCL-NALOXONE HCL 8-2 MG SL FILM
1.0000 | ORAL_FILM | Freq: Three times a day (TID) | SUBLINGUAL | 0 refills | Status: DC
Start: 2023-10-16 — End: 2023-10-25
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
## 2023-10-25 ENCOUNTER — Ambulatory Visit: Payer: No Typology Code available for payment source | Attending: Internal Medicine

## 2023-10-25 ENCOUNTER — Other Ambulatory Visit (HOSPITAL_BASED_OUTPATIENT_CLINIC_OR_DEPARTMENT_OTHER): Payer: Self-pay

## 2023-10-25 ENCOUNTER — Other Ambulatory Visit: Payer: Self-pay

## 2023-10-25 VITALS — BP 134/80 | HR 75 | Temp 97.2°F

## 2023-10-25 DIAGNOSIS — F251 Schizoaffective disorder, depressive type: Secondary | ICD-10-CM | POA: Insufficient documentation

## 2023-10-25 DIAGNOSIS — I5032 Chronic diastolic (congestive) heart failure: Secondary | ICD-10-CM | POA: Insufficient documentation

## 2023-10-25 DIAGNOSIS — G894 Chronic pain syndrome: Secondary | ICD-10-CM | POA: Diagnosis present

## 2023-10-25 DIAGNOSIS — J449 Chronic obstructive pulmonary disease, unspecified: Secondary | ICD-10-CM | POA: Insufficient documentation

## 2023-10-25 DIAGNOSIS — F1120 Opioid dependence, uncomplicated: Secondary | ICD-10-CM | POA: Diagnosis not present

## 2023-10-25 DIAGNOSIS — G40909 Epilepsy, unspecified, not intractable, without status epilepticus: Secondary | ICD-10-CM | POA: Diagnosis present

## 2023-10-25 DIAGNOSIS — H66005 Acute suppurative otitis media without spontaneous rupture of ear drum, recurrent, left ear: Secondary | ICD-10-CM | POA: Insufficient documentation

## 2023-10-25 MED ORDER — MIRTAZAPINE 7.5 MG PO TABS
7.5000 mg | ORAL_TABLET | Freq: Every evening | ORAL | 0 refills | Status: DC
Start: 2023-10-25 — End: 2024-05-09

## 2023-10-25 MED ORDER — LEVETIRACETAM 750 MG PO TABS
750.0000 mg | ORAL_TABLET | Freq: Two times a day (BID) | ORAL | 3 refills | Status: DC
Start: 2023-10-25 — End: 2023-11-29

## 2023-10-25 MED ORDER — SUCRALFATE 1 G PO TABS
1.0000 g | ORAL_TABLET | Freq: Four times a day (QID) | ORAL | 3 refills | Status: DC
Start: 2023-10-25 — End: 2023-11-29

## 2023-10-25 MED ORDER — CLONAZEPAM 1 MG PO TABS
1.0000 mg | ORAL_TABLET | Freq: Every evening | ORAL | 0 refills | Status: DC | PRN
Start: 2023-10-25 — End: 2023-11-29

## 2023-10-25 MED ORDER — ATORVASTATIN CALCIUM 80 MG PO TABS
80.0000 mg | ORAL_TABLET | Freq: Every day | ORAL | 3 refills | Status: DC
Start: 2023-10-25 — End: 2023-11-29

## 2023-10-25 MED ORDER — BACLOFEN 10 MG PO TABS
10.0000 mg | ORAL_TABLET | Freq: Two times a day (BID) | ORAL | 0 refills | Status: DC | PRN
Start: 2023-10-25 — End: 2023-11-29

## 2023-10-25 MED ORDER — PRAZOSIN HCL 1 MG PO CAPS
1.0000 mg | ORAL_CAPSULE | Freq: Every evening | ORAL | 0 refills | Status: DC
Start: 2023-10-25 — End: 2023-11-22

## 2023-10-25 MED ORDER — DIPHENHYDRAMINE HCL 25 MG PO CAPS
25.0000 mg | ORAL_CAPSULE | Freq: Four times a day (QID) | ORAL | 0 refills | Status: AC | PRN
Start: 2023-10-25 — End: 2023-11-24

## 2023-10-25 MED ORDER — OXCARBAZEPINE 150 MG PO TABS
150.0000 mg | ORAL_TABLET | Freq: Two times a day (BID) | ORAL | 1 refills | Status: DC
Start: 2023-10-25 — End: 2023-11-29

## 2023-10-25 MED ORDER — ONE-DAILY MULTI VITAMINS PO TABS
1.0000 | ORAL_TABLET | Freq: Every day | ORAL | 3 refills | Status: DC
Start: 2023-10-25 — End: 2023-11-29

## 2023-10-25 MED ORDER — METHOCARBAMOL 500 MG PO TABS
500.0000 mg | ORAL_TABLET | Freq: Three times a day (TID) | ORAL | 0 refills | Status: AC | PRN
Start: 2023-10-25 — End: 2023-11-24

## 2023-10-25 MED ORDER — AMOXICILLIN-POT CLAVULANATE 875-125 MG PO TABS
1.0000 | ORAL_TABLET | Freq: Two times a day (BID) | ORAL | 0 refills | Status: AC
Start: 2023-10-25 — End: 2023-11-04

## 2023-10-25 MED ORDER — OMEPRAZOLE 40 MG PO CPDR
40.0000 mg | DELAYED_RELEASE_CAPSULE | Freq: Every day | ORAL | 3 refills | Status: DC
Start: 2023-10-25 — End: 2023-11-29

## 2023-10-25 MED ORDER — BUPRENORPHINE HCL-NALOXONE HCL 8-2 MG SL FILM
1.0000 | ORAL_FILM | Freq: Three times a day (TID) | SUBLINGUAL | 0 refills | Status: DC
Start: 2023-10-25 — End: 2023-11-29

## 2023-10-25 MED ORDER — MIRTAZAPINE 15 MG PO TABS
15.0000 mg | ORAL_TABLET | Freq: Every evening | ORAL | 2 refills | Status: DC
Start: 2023-10-25 — End: 2024-01-24

## 2023-10-25 MED ORDER — FLUOXETINE HCL 10 MG PO TABS
10.0000 mg | ORAL_TABLET | Freq: Every day | ORAL | 2 refills | Status: DC
Start: 2023-10-25 — End: 2024-05-09

## 2023-10-25 MED ORDER — LEVOTHYROXINE SODIUM 25 MCG PO TABS
25.0000 ug | ORAL_TABLET | Freq: Every morning | ORAL | 3 refills | Status: DC
Start: 2023-10-25 — End: 2023-11-29

## 2023-10-25 MED ORDER — VITAMIN D 25 MCG (1000 UT) PO TABS
1000.0000 [IU] | ORAL_TABLET | Freq: Every day | ORAL | 1 refills | Status: DC
Start: 2023-10-25 — End: 2023-11-29

## 2023-10-25 MED ORDER — OLANZAPINE 5 MG PO TABS
5.0000 mg | ORAL_TABLET | Freq: Two times a day (BID) | ORAL | 2 refills | Status: DC
Start: 2023-10-25 — End: 2023-11-29

## 2023-10-25 MED ORDER — FLUOXETINE HCL 40 MG PO CAPS
40.0000 mg | ORAL_CAPSULE | Freq: Every day | ORAL | 2 refills | Status: DC
Start: 2023-10-25 — End: 2024-05-09

## 2023-10-25 MED ORDER — ACETAMINOPHEN 325 MG PO TABS
650.0000 mg | ORAL_TABLET | Freq: Four times a day (QID) | ORAL | 11 refills | Status: AC | PRN
Start: 2023-10-25 — End: 2024-03-08

## 2023-10-25 MED ORDER — BUDESONIDE-FORMOTEROL FUMARATE 160-4.5 MCG/ACT IN AERO
2.0000 | INHALATION_SPRAY | Freq: Two times a day (BID) | RESPIRATORY_TRACT | 2 refills | Status: DC
Start: 2023-10-25 — End: 2023-11-29

## 2023-10-25 MED ORDER — BUSPIRONE HCL 10 MG PO TABS
20.0000 mg | ORAL_TABLET | Freq: Three times a day (TID) | ORAL | 0 refills | Status: DC
Start: 2023-10-25 — End: 2023-11-22

## 2023-10-25 MED ORDER — POLYETHYLENE GLYCOL 3350 17 G PO PACK
17.0000 g | PACK | Freq: Every day | ORAL | 3 refills | Status: DC
Start: 2023-10-25 — End: 2023-11-29

## 2023-10-25 MED ORDER — SPIRONOLACTONE 100 MG PO TABS
100.0000 mg | ORAL_TABLET | Freq: Every day | ORAL | 3 refills | Status: DC
Start: 2023-10-25 — End: 2023-11-29

## 2023-10-25 MED ORDER — FLUTICASONE PROPIONATE 50 MCG/ACT NA SUSP
2.0000 | Freq: Every day | NASAL | 1 refills | Status: DC
Start: 2023-10-25 — End: 2023-11-29

## 2023-10-25 MED ORDER — GABAPENTIN 800 MG PO TABS
800.0000 mg | ORAL_TABLET | Freq: Three times a day (TID) | ORAL | 0 refills | Status: DC
Start: 2023-10-25 — End: 2023-11-29

## 2023-10-25 MED ORDER — NICOTINE POLACRILEX 4 MG MT GUM
4.0000 mg | CHEWING_GUM | OROMUCOSAL | 11 refills | Status: DC | PRN
Start: 2023-10-25 — End: 2023-11-29

## 2023-10-25 MED ORDER — LACTULOSE 10 GM/15ML PO SOLN
30.0000 g | ORAL | 0 refills | Status: DC | PRN
Start: 2023-10-25 — End: 2023-10-31

## 2023-10-25 MED ORDER — BUMETANIDE 1 MG PO TABS
1.0000 mg | ORAL_TABLET | Freq: Every day | ORAL | 2 refills | Status: DC
Start: 2023-10-25 — End: 2023-11-29

## 2023-10-25 NOTE — Telephone Encounter (Signed)
PER Pharmacy, Cheryl Zimmerman is a 53 year old female has requested a refill of Magnesium Gluconate.      Last OFFICE/TELE Visit:  10/16/2023 with Otilio Miu, I      Last Physical Exam:   Not on file.     There are no preventive care reminders to display for this patient.    Other Med Adult:  Most Recent BP Reading(s)  06/02/23 : (!) 157/100        Cholesterol (mg/dL)   Date Value   16/06/9603 263 (H)     LOW DENSITY LIPOPROTEIN DIRECT (mg/dL)   Date Value   54/05/8118 192 (H)     HIGH DENSITY LIPOPROTEIN (mg/dL)   Date Value   14/78/2956 40     TRIGLYCERIDES (mg/dL)   Date Value   21/30/8657 301 (H)         THYROID SCREEN TSH REFLEX FT4 (uIU/mL)   Date Value   05/31/2023 2.110         TSH (THYROID STIM HORMONE) (uIU/mL)   Date Value   12/10/2022 1.190       HEMOGLOBIN A1C (%)   Date Value   07/15/2022 6.5 (H)       No results found for: "POCA1C"      INR (no units)   Date Value   06/02/2011 1.0 (L)   06/02/2011 < 1.0 (*L)     INR CARE EVERYWHERE (no units)   Date Value   10/16/2021 1.0   09/23/2021 0.9   01/06/2021 1.1   11/11/2020 1.0   07/05/2020 0.9   07/04/2020 0.8 (L)   03/13/2020 1.0       SODIUM (mmol/L)   Date Value   05/31/2023 138       POTASSIUM (mmol/L)   Date Value   05/31/2023 4.0           CREATININE (mg/dL)   Date Value   84/69/6295 0.8       Documented patient preferred pharmacies:    [id number], [place] - [address]  Phone: [phone] Fax: [phone]

## 2023-10-25 NOTE — Progress Notes (Signed)
 RESIDENT PRIMARY CARE NOTE       SUBJECTIVE  Cheryl Zimmerman is a 53 year old English-speaking female here for:    Chief concern(s): OUD, benzo dependence f/u    #Opioid dependence, on suboxone  #chronic pain  #Schizoaffective/mood disorder, new benzo therapy dependence  -See note from 10/16/2023, at that vist 1 week supply of benzo and suboxone prescribed with plan for f/u with this writer in person today  -pt is in the process of switching care to [facility], or nearby, where is currently residing, though at present does not have a prescriber  -reviewed PDMP  -pt doing well, not feeling sedated/sleepy, feels as though the benzo prescribed by psychiatry at recent hospitalization is helping her a lot, not interested in reducing dose today  -is interested in methadone though given chronic pain and wheelchair dependent, as well as through support/care/DMH team can only get rides 3x week at best, will not be able to make it to methadone clinic every day  -feels like suboxone is helping but would prefer to be on methadone.     #HFpEF  -pt feeling well, breathing is okay, though notably has not been taking bumex since psych discharge, 09/17/2023. No orthopnea, PND, DOE.  Most Recent Weight Reading(s)  05/29/23 : (!) 141.1 kg (311 lb 1.1 oz)  02/01/23 : (!) 146 kg (321 lb 14 oz)  12/31/22 : (!) 155.2 kg (342 lb 1.6 oz)  12/11/22 : (!) 158.8 kg (350 lb)  12/10/22 : (!) 158.8 kg (350 lb)        ROS - Pertinent symptoms as noted above, other ROS negative    Active Medical Issues:  Patient Active Problem List:     Dyspnea on minimal exertion     Suicidal behavior with attempted self-injury ([name])     Tobacco use disorder     Systolic congestive heart failure ([name])     Seizure disorder ([name])     Depressive disorder     Suicidal ideation     Afib ([name])     Alcohol use disorder     Borderline personality disorder ([name])     Chronic pain disorder     Morbid obesity ([name])     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)     PTSD  (post-traumatic stress disorder)     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     (HFpEF) heart failure with preserved ejection fraction ([name])     MDD (major depressive disorder), recurrent, severe, with psychosis ([name])     Requires continuous at home supplemental oxygen     Spinal stenosis of lumbar 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 claudication     Fall in home     Gait instability     Hypokalemia     Lung nodule     Stable angina ([name])     Chest pain, atypical     Obesity hypoventilation syndrome ([name])     Osteoarthritis of spine with radiculopathy, lumbar region     Chronic bilateral low back pain with bilateral sciatica     Hypoxia     Alcohol withdrawal syndrome, uncomplicated ([name])     Cocaine use     Homicidal ideation     Somnolence     Obstructive sleep apnea syndrome     Acute respiratory failure with hypoxia and hypercapnia ([name])     Acute metabolic encephalopathy     B12 deficiency     Vitamin D deficiency      Past  medical, surgical, and social history reviewed.       OBJECTIVE  BP 134/80   Pulse 75   Temp 97.2 F (36.2 C) (Temporal)   SpO2 98%   Most Recent Weight Reading(s)  05/29/23 : (!) 141.1 kg (311 lb 1.1 oz)  02/01/23 : (!) 146 kg (321 lb 14 oz)  12/31/22 : (!) 155.2 kg (342 lb 1.6 oz)  12/11/22 : (!) 158.8 kg (350 lb)  12/10/22 : (!) 158.8 kg (350 lb)    Most Recent BP Reading(s)  10/25/23 : 134/80  06/02/23 : (!) 157/100  02/01/23 : 117/79  12/31/22 : 111/70  12/14/22 : 115/68      Physical Exam  Constitutional:       General: She is not in acute distress.     Appearance: Normal appearance. She is not ill-appearing, toxic-appearing or diaphoretic.   HENT:      Head: Normocephalic and atraumatic.      Nose: No congestion or rhinorrhea.   Eyes:      General: No scleral icterus.        Right eye: No discharge.         Left eye: No discharge.      Conjunctiva/sclera: Conjunctivae normal.   Cardiovascular:      Rate and Rhythm: Normal rate and regular rhythm.      Heart sounds: Normal heart sounds.    Pulmonary:      Effort: Pulmonary effort is normal. No respiratory distress.      Breath sounds: Normal breath sounds.   Abdominal:      General: There is no distension.      Palpations: Abdomen is soft.      Tenderness: There is no abdominal tenderness.   Skin:     General: Skin is warm and dry.   Neurological:      Mental Status: She is alert.      Comments: Grossly intact   Psychiatric:         Mood and Affect: Mood normal.         Behavior: Behavior normal.         Thought Content: Thought content normal.         Judgment: Judgment normal.         I personally reviewed relevant labs, imaging, and studies in EpicCare.       ASSESSMENT AND PLAN  Cheryl Zimmerman is a 53 year old female who presents for f/u suboxone, benzodiazepine therapy.    (F25.1) Schizoaffective disorder, depressive type ([name])  Comment: mood good today, congruent with affect; refilled pts psych meds as per recent discharge summary from [facility], pt is waiting to establish care with psychiatrist, has upcoming appt.  Pt initiated on benzos during psych admission without prescriber/plan for f/u, now has dependence and not interested in starting taper today. Discussed that once establishes care with psychiatry I will ask them to take over prescribing.  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 If can coordinate transport/logistical challenges for getting to a methadone clinic, pt would be good candidate for methadone therapy in future.  Plan: buprenorphine-naloxone (SUBOXONE) 8-2 MG         sublingual film        OPIOID USE DISORDER (OUD/OBAT) NURSE CASE         MANAGERS    (  G89.4) Chronic pain disorder  Comment: suboxone as above, as well refilled pts chronic meds of baclofen, gabapentin, methocarbamol    (I50.32) Chronic heart failure with preserved ejection fraction  ([name])  Comment: sx stable, weight down today tough unsure how accurate all weight readings are, no concerning sx for volume overload at this time, has not been taking diuretic since end of 2024. Was previously on bumex 4 daily and has been admitted for HFpEF exacerbation. However, weight down and sx stable now for > 1 month off diuretics. Additionally, pt strongly does not want to take diuretic as has trouble getting to bathroom frequently. Compromised to restart at 1mg  daily, with close monitoring of signs/sx, pt will work to get scale at home. Pt not willing to obtain labwork today, discussed importance of lab monitoring while on medications.    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, unspecified epilepsy type ([name])  Comment: stable, refilled AEDs    (E66.01) Morbid (severe) obesity due to excess calories ([name])  Comment: Recent weight loss as below  Most Recent Weight Reading(s)  05/29/23 : (!) 141.1 kg (311 lb 1.1 oz)  02/01/23 : (!) 146 kg (321 lb 14 oz)  12/31/22 : (!) 155.2 kg (342 lb 1.6 oz)  12/11/22 : (!) 158.8 kg (350 lb)  12/10/22 : (!) 158.8 kg (350 lb)  Pt will need UTD cancer screenings, per chart review appears to be due for mammo, pap and CRC screen, will address at future visits    (H66.005) Recurrent acute suppurative otitis media without spontaneous rupture of left tympanic membrane  Comment: Pt with bulging TM L side with small amount of purulent draining, no hearing concerns. Exam c/w AOM, will rx augmetin. Has complicated otologic history including cholesteatoma and mastoidectomy, Strongly advised pt see ENT though pt stating will not go see ENT at this time, too much transportation. Discussed return precautions, [facility] to [facility]. Will need ENT referral in future given complex history  regardless.  Plan: amoxicillin-clavulanate (AUGMENTIN) 875-125 MG         per tablet      Follow up in approximately 5 weeks or sooner PRN    Discussed with Attending Dr. Randell Patient, MD  [name], MD, 10/25/2023    Resident Physician

## 2023-10-26 NOTE — Progress Notes (Signed)
 PRECEPTOR NOTE  On the day of the patients visit, I personally reviewed the key elements of history, physical exam, and findings as described in resident's note with the resident.    I agree with the assessment and plan as described in residents note.  Please see resident's note for further details.

## 2023-10-27 ENCOUNTER — Other Ambulatory Visit (HOSPITAL_BASED_OUTPATIENT_CLINIC_OR_DEPARTMENT_OTHER): Payer: Self-pay

## 2023-10-27 ENCOUNTER — Telehealth (HOSPITAL_BASED_OUTPATIENT_CLINIC_OR_DEPARTMENT_OTHER): Payer: Self-pay | Admitting: Ambulatory Care

## 2023-10-27 NOTE — Telephone Encounter (Signed)
TW reached out to pt after referral was placed in work que for [employer] nurse case manager   Pt seen by provider on 2/5 for hosp follow up   Hospitalized psychiatrically and d/c home to [place]   Prefers to continue her care through [facility]  Currently on 24 mg suboxone daily     PCP DR [name]   Active [facility]   Unable to reach pt   Pt has new script for suboxone   Will await response

## 2023-10-30 ENCOUNTER — Telehealth (HOSPITAL_BASED_OUTPATIENT_CLINIC_OR_DEPARTMENT_OTHER): Payer: Self-pay | Admitting: Ambulatory Care
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
## 2023-10-31 ENCOUNTER — Other Ambulatory Visit (HOSPITAL_BASED_OUTPATIENT_CLINIC_OR_DEPARTMENT_OTHER): Payer: Self-pay

## 2023-10-31 NOTE — Telephone Encounter (Signed)
Pharmacy contacting CR asking for clarification on directions.  Pharmacy needs to know how many times a day the patient will be taking.

## 2023-11-03 ENCOUNTER — Telehealth (HOSPITAL_BASED_OUTPATIENT_CLINIC_OR_DEPARTMENT_OTHER): Payer: Self-pay | Admitting: Ambulatory Care

## 2023-11-03 MED ORDER — LACTULOSE 10 GM/15ML PO SOLN
30.0000 g | ORAL | 0 refills | Status: DC | PRN
Start: 2023-11-03 — End: 2023-11-29

## 2023-11-03 NOTE — Telephone Encounter (Signed)
 TW reached out to pt and updated her with response from provider   Informed her that Dr Otilio Miu feels an inpatient admission in not necessary for her to transition to methadone and she can switch to a OTP methadone clinic of her choice if that is what she prefers     Pt feels ok with this plan   Continues to feel suboxone is not helping her   States she no longer wishes to go to [facility]  She found a place closer to [place] in [facility] in [place] writer reached out and spoke to intake nurse Renea Ee   [phone]    She informs this writer that matilynn would have to call and schedule an intake which takes approx 3 hours time  They do provide transportation once she becomes a patient   Then they can set her up with [facility] Transportation daily     TW called patient back to provider her with this information   Unable to reach   VM left along with text message providing steps pt needs to take to connect to methadone treatment facility

## 2023-11-07 ENCOUNTER — Telehealth (HOSPITAL_BASED_OUTPATIENT_CLINIC_OR_DEPARTMENT_OTHER): Payer: Self-pay

## 2023-11-07 NOTE — Telephone Encounter (Signed)
 In your 02/05 case notes, please update your case notes to include the following information:          Patient will also need an in-person mobility assessment with provider (please use DMEPOV smartphrase when documenting this)      This is NEEDED by the insurance in order to move forward

## 2023-11-16 NOTE — Progress Notes (Signed)
 RESIDENT PRIMARY CARE NOTE ADDENDUM    Patients mobility related medical history: Chronic pain, neuropathy  Patients mobility related limitations: pt has significant difficulty with weight bearing, due to severe pain, deconditioning and instability, neuropathy  Patient has a diagnosis of chronic pain w/ opioid dependence, neuropathy, heart failure:  Places them at a heightened risk of morbidity/mortality when attempting to accomplish MRADLs and use of a wheelchair will improve their ability to safely accomplish MRADLs in the home   Patients mobility impairment cannot be resolved with the use of a cane or a walker because patient does not have sufficient strength, sensation nor adequate pain control.  Patient does not have the upper extremity function to safely self-propel a manual wheelchair due to chronic pain, neuropathy, weakness, but does have the mental and physical capability to operate power wheelchair safely.  Patient is able to safely transfer to and from and operate power wheelchair. Patient is able to maintain postural stability and position while operating in the home.    [name], MD  PGY3, [facility]

## 2023-11-19 ENCOUNTER — Other Ambulatory Visit (HOSPITAL_BASED_OUTPATIENT_CLINIC_OR_DEPARTMENT_OTHER): Payer: Self-pay

## 2023-11-19 NOTE — Telephone Encounter (Signed)
 PER Pharmacy, Cheryl Zimmerman is a 53 year old female has requested a refill of BUSPIRONE.      Last OFFICE/TELE Visit:  10/25/2023 with PCP      Last Physical Exam:   Not on file.     There are no preventive care reminders to display for this patient.    Other Med Adult:  Most Recent BP Reading(s)  10/25/23 : 134/80        Cholesterol (mg/dL)   Date Value   16/06/9603 263 (H)     LOW DENSITY LIPOPROTEIN DIRECT (mg/dL)   Date Value   54/05/8118 192 (H)     HIGH DENSITY LIPOPROTEIN (mg/dL)   Date Value   14/78/2956 40     TRIGLYCERIDES (mg/dL)   Date Value   21/30/8657 301 (H)         THYROID SCREEN TSH REFLEX FT4 (uIU/mL)   Date Value   05/31/2023 2.110         TSH (THYROID STIM HORMONE) (uIU/mL)   Date Value   12/10/2022 1.190       HEMOGLOBIN A1C (%)   Date Value   07/15/2022 6.5 (H)       No results found for: "POCA1C"      INR (no units)   Date Value   06/02/2011 1.0 (L)   06/02/2011 < 1.0 (*L)     INR CARE EVERYWHERE (no units)   Date Value   10/16/2021 1.0   09/23/2021 0.9   01/06/2021 1.1   11/11/2020 1.0   07/05/2020 0.9   07/04/2020 0.8 (L)   03/13/2020 1.0       SODIUM (mmol/L)   Date Value   05/31/2023 138       POTASSIUM (mmol/L)   Date Value   05/31/2023 4.0           CREATININE (mg/dL)   Date Value   84/69/6295 0.8       Documented patient preferred pharmacies:    [id number] [place], [place] - [address]  Phone: [phone] Fax: [phone]

## 2023-11-20 ENCOUNTER — Other Ambulatory Visit (HOSPITAL_BASED_OUTPATIENT_CLINIC_OR_DEPARTMENT_OTHER): Payer: Self-pay

## 2023-11-20 NOTE — Telephone Encounter (Signed)
 PER Patient (self), Cheryl Zimmerman is a 54 year old female has requested a refill of prazosin      Last Office Visit: 10/25/2023 with Otilio Miu  Last Physical Exam: n/a    There are no preventive care reminders to display for this patient.    Other Med Adult:  Most Recent BP Reading(s)  10/25/23 : 134/80        Cholesterol (mg/dL)   Date Value   38/75/6433 263 (H)     LOW DENSITY LIPOPROTEIN DIRECT (mg/dL)   Date Value   29/51/8841 192 (H)     HIGH DENSITY LIPOPROTEIN (mg/dL)   Date Value   66/02/3015 40     TRIGLYCERIDES (mg/dL)   Date Value   09/27/3233 301 (H)         THYROID SCREEN TSH REFLEX FT4 (uIU/mL)   Date Value   05/31/2023 2.110         TSH (THYROID STIM HORMONE) (uIU/mL)   Date Value   12/10/2022 1.190       HEMOGLOBIN A1C (%)   Date Value   07/15/2022 6.5 (H)       No results found for: "POCA1C"      INR (no units)   Date Value   06/02/2011 1.0 (L)   06/02/2011 < 1.0 (*L)     INR CARE EVERYWHERE (no units)   Date Value   10/16/2021 1.0   09/23/2021 0.9   01/06/2021 1.1   11/11/2020 1.0   07/05/2020 0.9   07/04/2020 0.8 (L)   03/13/2020 1.0       SODIUM (mmol/L)   Date Value   05/31/2023 138       POTASSIUM (mmol/L)   Date Value   05/31/2023 4.0           CREATININE (mg/dL)   Date Value   57/32/2025 0.8       Documented patient preferred pharmacies:    [id number], [place] - [address]  Phone: [phone] Fax: [phone]

## 2023-11-28 ENCOUNTER — Telehealth (HOSPITAL_BASED_OUTPATIENT_CLINIC_OR_DEPARTMENT_OTHER): Payer: Self-pay | Admitting: Ambulatory Care

## 2023-11-28 NOTE — Telephone Encounter (Signed)
 Pt reached out to this writer stating that she has had phone issues and could not get back to me     Pt states she has been having issues with her left ear   States she has been having green drainage and pain   States, " my left side of my face is numb"   Left Mass and ear due to transportation issues   Denies fever   Denies redness    Appears in no acute distress  Was told the ct scan looked OK but still wishes to be "admitted for IV antibiotics"     Pt has decided to stay on suboxone after discussing options with DR Otilio Miu   States the suboxone relieves her pain for 1/1/2 after taking it and then it wears off     Thinking about going up to 32mg    Will discuss tomorrow   Ceiling effect discussed - pt is aware of this     Pt also stating that she is getting depressed due to being home bound states, " I feel like a prisoner"   Has been requesting an electric chair so that she can get out more often but has been waiting months and months     Prescription has been sent to [facility]   Spoke to [place] [phone]   Pt is wondering why she has not heard back   " I feel like nobody cares"     Requesting outreach to [employer] to find out if they need more information to get it covered     Informed provider will be notified

## 2023-11-29 ENCOUNTER — Ambulatory Visit: Payer: No Typology Code available for payment source | Attending: Internal Medicine

## 2023-11-29 ENCOUNTER — Encounter (HOSPITAL_BASED_OUTPATIENT_CLINIC_OR_DEPARTMENT_OTHER): Payer: Self-pay

## 2023-11-29 ENCOUNTER — Other Ambulatory Visit: Payer: Self-pay

## 2023-11-29 VITALS — BP 143/81 | HR 90 | Temp 98.2°F

## 2023-11-29 DIAGNOSIS — F1320 Sedative, hypnotic or anxiolytic dependence, uncomplicated: Secondary | ICD-10-CM | POA: Diagnosis present

## 2023-11-29 DIAGNOSIS — G894 Chronic pain syndrome: Secondary | ICD-10-CM | POA: Insufficient documentation

## 2023-11-29 DIAGNOSIS — F1120 Opioid dependence, uncomplicated: Secondary | ICD-10-CM | POA: Insufficient documentation

## 2023-11-29 DIAGNOSIS — H663X2 Other chronic suppurative otitis media, left ear: Secondary | ICD-10-CM | POA: Insufficient documentation

## 2023-11-29 MED ORDER — CIPROFLOXACIN-DEXAMETHASONE 0.3-0.1 % OT SUSP
4.0000 [drp] | Freq: Two times a day (BID) | OTIC | 0 refills | Status: AC
Start: 2023-11-29 — End: 2023-12-13

## 2023-11-29 MED ORDER — LORATADINE 10 MG PO TABS
10.0000 mg | ORAL_TABLET | Freq: Every day | ORAL | 0 refills | Status: DC
Start: 2023-11-29 — End: 2023-12-27

## 2023-11-29 MED ORDER — PRAZOSIN HCL 1 MG PO CAPS
1.0000 mg | ORAL_CAPSULE | Freq: Every evening | ORAL | 0 refills | Status: DC
Start: 2023-11-29 — End: 2024-01-24

## 2023-11-29 MED ORDER — BUSPIRONE HCL 10 MG PO TABS
10.0000 mg | ORAL_TABLET | Freq: Three times a day (TID) | ORAL | 0 refills | Status: AC
Start: 2023-11-29 — End: 2024-01-28

## 2023-11-29 MED ORDER — MAGNESIUM GLUCONATE 500 MG PO TABS
500.0000 mg | ORAL_TABLET | Freq: Two times a day (BID) | ORAL | 1 refills | Status: DC
Start: 2023-11-29 — End: 2023-12-28

## 2023-11-29 MED ORDER — BUPRENORPHINE HCL-NALOXONE HCL 8-2 MG SL FILM
2.0000 | ORAL_FILM | Freq: Two times a day (BID) | SUBLINGUAL | 0 refills | Status: AC
Start: 2023-11-29 — End: 2023-12-29

## 2023-11-29 MED ORDER — FLUTICASONE PROPIONATE 50 MCG/ACT NA SUSP
2.0000 | Freq: Every day | NASAL | 1 refills | Status: DC
Start: 2023-11-29 — End: 2024-01-15

## 2023-11-29 MED ORDER — BACLOFEN 10 MG PO TABS
10.0000 mg | ORAL_TABLET | Freq: Two times a day (BID) | ORAL | 0 refills | Status: DC | PRN
Start: 2023-11-29 — End: 2024-01-30

## 2023-11-29 MED ORDER — LEVETIRACETAM 750 MG PO TABS
750.0000 mg | ORAL_TABLET | Freq: Two times a day (BID) | ORAL | 3 refills | Status: DC
Start: 2023-11-29 — End: 2024-05-09

## 2023-11-29 MED ORDER — OLANZAPINE 5 MG PO TABS
10.0000 mg | ORAL_TABLET | Freq: Every day | ORAL | 2 refills | Status: DC
Start: 2023-11-29 — End: 2024-05-09

## 2023-11-29 MED ORDER — BUDESONIDE-FORMOTEROL FUMARATE 160-4.5 MCG/ACT IN AERO
2.0000 | INHALATION_SPRAY | Freq: Two times a day (BID) | RESPIRATORY_TRACT | 2 refills | Status: AC
Start: 2023-11-29 — End: 2024-12-02

## 2023-11-29 MED ORDER — GABAPENTIN 800 MG PO TABS
800.0000 mg | ORAL_TABLET | Freq: Three times a day (TID) | ORAL | 0 refills | Status: DC
Start: 2023-11-29 — End: 2023-12-28

## 2023-11-29 MED ORDER — POLYETHYLENE GLYCOL 3350 17 G PO PACK
17.0000 g | PACK | Freq: Every day | ORAL | 3 refills | Status: AC
Start: 2023-11-29 — End: 2024-11-28

## 2023-11-29 MED ORDER — CLONAZEPAM 1 MG PO TABS
1.0000 mg | ORAL_TABLET | Freq: Every evening | ORAL | 0 refills | Status: DC | PRN
Start: 2023-11-29 — End: 2023-12-28

## 2023-11-29 MED ORDER — BUMETANIDE 1 MG PO TABS
1.0000 mg | ORAL_TABLET | Freq: Every day | ORAL | 2 refills | Status: DC
Start: 2023-11-29 — End: 2024-04-22

## 2023-11-29 MED ORDER — OXCARBAZEPINE 150 MG PO TABS
150.0000 mg | ORAL_TABLET | Freq: Two times a day (BID) | ORAL | 1 refills | Status: DC
Start: 2023-11-29 — End: 2024-05-09

## 2023-11-29 MED ORDER — SUCRALFATE 1 G PO TABS
1.0000 g | ORAL_TABLET | Freq: Four times a day (QID) | ORAL | 3 refills | Status: AC
Start: 2023-11-29 — End: 2024-11-28

## 2023-11-29 MED ORDER — LACTULOSE 10 GM/15ML PO SOLN
30.0000 g | ORAL | 0 refills | Status: AC | PRN
Start: 2023-11-29 — End: 2023-12-29

## 2023-11-29 MED ORDER — ATORVASTATIN CALCIUM 80 MG PO TABS
80.0000 mg | ORAL_TABLET | Freq: Every day | ORAL | 3 refills | Status: AC
Start: 2023-11-29 — End: 2024-11-28

## 2023-11-29 MED ORDER — LEVOTHYROXINE SODIUM 25 MCG PO TABS: 25.0000 ug | ORAL_TABLET | Freq: Every morning | ORAL | 3 refills | Status: DC

## 2023-11-29 MED ORDER — VITAMIN D 25 MCG (1000 UT) PO TABS
1000.0000 [IU] | ORAL_TABLET | Freq: Every day | ORAL | 1 refills | Status: DC
Start: 2023-11-29 — End: 2024-05-09

## 2023-11-29 MED ORDER — NICOTINE POLACRILEX 4 MG MT GUM
4.0000 mg | CHEWING_GUM | OROMUCOSAL | 11 refills | Status: AC | PRN
Start: 2023-11-29 — End: 2024-11-28

## 2023-11-29 MED ORDER — ONE-DAILY MULTI VITAMINS PO TABS
1.0000 | ORAL_TABLET | Freq: Every day | ORAL | 3 refills | Status: AC
Start: 2023-11-29 — End: 2024-11-28

## 2023-11-29 MED ORDER — OMEPRAZOLE 40 MG PO CPDR
40.0000 mg | DELAYED_RELEASE_CAPSULE | Freq: Every day | ORAL | 3 refills | Status: AC
Start: 2023-11-29 — End: 2024-11-28

## 2023-11-29 MED ORDER — SPIRONOLACTONE 100 MG PO TABS
100.0000 mg | ORAL_TABLET | Freq: Every day | ORAL | 3 refills | Status: AC
Start: 2023-11-29 — End: 2024-11-28

## 2023-11-29 NOTE — Progress Notes (Unsigned)
 RESIDENT PRIMARY CARE NOTE       SUBJECTIVE  Cheryl Zimmerman is a 53 year old English-speaking female here for:    Chief concern(s): Follow Up (F/u suboxone)     #L ear pain  #recurrent/chronic suppurative OM  -see visit 10/2023 for additional details, h/o recurrent ear infections, cholesteatomas, mastoidectomies  -Rx'd augmentin for L AOM, which at that time (10/25/2023) was recurrent, had presented to OSH and rx'd topical and PO abx  -Pt says saw ENT doctor in [place] close to where she lives though can't see this in Epic, was told needs to f/u with [facility], which have also been advising given complex ENT/ear infection history though pt remains reluctant, largely due to traveling, waiting for an appointment, feels like "too much".. pt is often reluctant to see other doctors other than pcp  -since last visit, notes the augmentin did help but did not resolve L ear pain and drainage completely, and over last couple weeks sx are worse again though have remained constant last week specifically, not worsening, associated w/ L sided headache and pressure, some days better than others, no vision changes, no N/V.  -head imaing @ [facility] 1.5 months ago (CT head):  FINDINGS:  HEAD:   Intra-axial structures: Gray-white matter differentiation is normal.  There is no evidence of acute infarct, hemorrhage, mass or mass  effect.   Ventricular system: Normal for age.  Extra-axial spaces: Normal for age.   Vasculature: The visualized vessels are normal.   Orbits: The visualized orbits are normal.  Calvarium: Normal.  Paranasal sinuses: Appear normal where visible.     #suboxone  #benzodiazepine dependence  #chronic pain f/u  -pt with worsening of acute on chronic pain due to ear infection as noted above  -pt remains persistent that suboxone helps but is inadequate for pain; continues to prefer methadone but due to logistical structural barriers is not an option at present  -is interested in increasing suboxone dose  today  -noting that has psychiatry follow up in [place] in 6 weeks at which point they can take over clonazepam Rx, see prior notes for further history on this but in brief was started at OSH during psych admission without a plan for who/which outpt provider would take over    ROS - Pertinent symptoms as noted above, other ROS negative    Active Medical Issues:  Patient Active Problem List:     Dyspnea on minimal exertion     Suicidal behavior with attempted self-injury ([name])     Tobacco use disorder     Systolic congestive heart failure ([name])     Seizure disorder ([name])     Depressive disorder     Suicidal ideation     Alcohol use disorder     Borderline personality disorder ([name])     Chronic pain disorder     Morbid obesity ([name])     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)     PTSD (post-traumatic stress disorder)     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     (HFpEF) heart failure with preserved ejection fraction ([name])     MDD (major depressive disorder), recurrent, severe, with psychosis ([name])     Requires continuous at home supplemental oxygen     Spinal stenosis of lumbar 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 claudication     Fall in home     Gait instability     Hypokalemia     Lung nodule     Stable  angina ([name])     Chest pain, atypical     Obesity hypoventilation syndrome ([name])     Osteoarthritis of spine with radiculopathy, lumbar region     Chronic bilateral low back pain with bilateral sciatica     Hypoxia     Alcohol withdrawal syndrome, uncomplicated ([name])     Cocaine use     Homicidal ideation     Somnolence     Obstructive sleep apnea syndrome     Acute metabolic encephalopathy     B12 deficiency     Vitamin D deficiency      Past medical, surgical, and social history reviewed.       OBJECTIVE  BP (!) 143/81   Pulse 90   Temp 98.2 F (36.8 C) (Temporal)   SpO2 96%   Most Recent Weight Reading(s)  05/29/23 : (!) 141.1 kg (311 lb 1.1 oz)  02/01/23 : (!) 146 kg (321 lb 14 oz)  12/31/22 : (!) 155.2 kg (342  lb 1.6 oz)  12/11/22 : (!) 158.8 kg (350 lb)  12/10/22 : (!) 158.8 kg (350 lb)    Most Recent BP Reading(s)  11/29/23 : (!) 143/81  10/25/23 : 134/80  06/02/23 : (!) 157/100  02/01/23 : 117/79  12/31/22 : 111/70      Physical Exam  Constitutional:       General: She is not in acute distress.     Appearance: Normal appearance. She is not ill-appearing, toxic-appearing or diaphoretic.   HENT:      Head: Normocephalic and atraumatic.      Right Ear: Tympanic membrane, ear canal and external ear normal.      Left Ear: External ear normal.      Ears:      Comments: L ear with green/purulent crusted drainage, TM erythematous     Nose: No congestion or rhinorrhea.   Eyes:      General: No scleral icterus.        Right eye: No discharge.         Left eye: No discharge.      Conjunctiva/sclera: Conjunctivae normal.   Cardiovascular:      Rate and Rhythm: Normal rate.   Pulmonary:      Effort: Pulmonary effort is normal. No respiratory distress.   Abdominal:      General: There is no distension.   Skin:     General: Skin is warm and dry.   Neurological:      Mental Status: She is alert.      Comments: Grossly intact   Psychiatric:         Mood and Affect: Mood normal.         Behavior: Behavior normal.         Thought Content: Thought content normal.         Judgment: Judgment normal.           I personally reviewed relevant labs, imaging, and studies in EpicCare.       ASSESSMENT AND PLAN  [name] is a 53 year old female who presents for recurrent ear pain.    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 Will  increase suboxone to 32mg  daily (16 BID).  As above per subjective portion, pt now has some degree of chemical dependence on benzodiazepines, will prescribe for 30d, pt remains not interested in starting taper. 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 Again given complex ENT history strongly encouraged pt f/u w/ [facility] (previously was followed here). CN exam intact, headache that is not worsening most likely related to ongoing ear infection, very low concern for acute sinusitis or maxillary other bony infection/osteomyelitis. If headache worsens and/or develops facial pain, fevers etc would obtain urgent head imaging.  Plan: ciprofloxacin-dexamethasone (CIPRODEX) otic         Suspension    Follow up in approximately 1 week via telephone, 5 weeks televisit      Discussed with Attending Dr. Benjamin Stain, MD  [name], MD, 11/29/2023    Resident Physician

## 2023-11-29 NOTE — Progress Notes (Unsigned)
 PRECEPTOR NOTE  On the day of the patients visit, I personally reviewed the key elements of history, physical exam, and findings as described in resident's note with the resident.    I agree with the assessment and plan as described in residents note.  Please see resident's note for further details.

## 2023-11-30 ENCOUNTER — Other Ambulatory Visit (HOSPITAL_BASED_OUTPATIENT_CLINIC_OR_DEPARTMENT_OTHER): Payer: Self-pay

## 2023-12-01 NOTE — Telephone Encounter (Signed)
 PER Pharmacy, Cheryl Zimmerman is a 53 year old female has requested a refill of   Melatonin 5 MG TABS Tablet .    Last Office Visit: 11/29/2023    Other Med Adult:  Most Recent BP Reading(s)  11/29/23 : (!) 143/81        Cholesterol (mg/dL)   Date Value   11/91/4782 263 (H)     LOW DENSITY LIPOPROTEIN DIRECT (mg/dL)   Date Value   95/62/1308 192 (H)     HIGH DENSITY LIPOPROTEIN (mg/dL)   Date Value   65/78/4696 40     TRIGLYCERIDES (mg/dL)   Date Value   29/52/8413 301 (H)         THYROID SCREEN TSH REFLEX FT4 (uIU/mL)   Date Value   05/31/2023 2.110         TSH (THYROID STIM HORMONE) (uIU/mL)   Date Value   12/10/2022 1.190       HEMOGLOBIN A1C (%)   Date Value   07/15/2022 6.5 (H)       No results found for: "POCA1C"      INR (no units)   Date Value   06/02/2011 1.0 (L)   06/02/2011 < 1.0 (*L)     INR CARE EVERYWHERE (no units)   Date Value   10/16/2021 1.0   09/23/2021 0.9   01/06/2021 1.1   11/11/2020 1.0   07/05/2020 0.9   07/04/2020 0.8 (L)   03/13/2020 1.0       SODIUM (mmol/L)   Date Value   05/31/2023 138       POTASSIUM (mmol/L)   Date Value   05/31/2023 4.0           CREATININE (mg/dL)   Date Value   24/40/1027 0.8       Documented patient preferred pharmacies:    [id number] [place], [place] - [address]  Phone: [phone] Fax: [phone]

## 2023-12-04 ENCOUNTER — Ambulatory Visit

## 2023-12-04 ENCOUNTER — Ambulatory Visit (HOSPITAL_BASED_OUTPATIENT_CLINIC_OR_DEPARTMENT_OTHER): Payer: Self-pay

## 2023-12-04 ENCOUNTER — Telehealth (HOSPITAL_BASED_OUTPATIENT_CLINIC_OR_DEPARTMENT_OTHER): Payer: Self-pay

## 2023-12-04 DIAGNOSIS — R350 Frequency of micturition: Secondary | ICD-10-CM

## 2023-12-04 DIAGNOSIS — R3589 Other polyuria: Secondary | ICD-10-CM

## 2023-12-04 NOTE — Telephone Encounter (Signed)
 Aviva Signs, MD  P [facility] Rn Pool  This patient presented via televist for concern for UTI though symptoms are only increased frequency.  Would be best to test this patient for vitals, UTI, A1c with nurse visit though patient lived 1.5 hours away and would have a difficult time scheduling a ride.  Please offer patient a nursing/lab visit with PTI or please offer her to get tested at [facility] near where she lives:  [facility], [address], [place], [place] [address]  Thank you.  Lora        Called pt.  Confirmed pt name and DOB.   Pt states "What is this about? I just got off the phone with you guys?"  RN explained to pt that Dr. Rob Hickman requested that RN reach out to pt to schedule visit for labs and vitals.  Pt states "[place]'am, I am not coming in to the clinic for any labs. Bye bye !" .  Pt then proceeded to end phone call.     Message sent as FYI to provider.     Ronalee Red, RN, 12/04/2023

## 2023-12-04 NOTE — Telephone Encounter (Signed)
 Acute Uncomplicated Cystitis Standing Order Progress Note    Based on patient interview, the following criteria were assessed:    Inclusion criteria (answer yes or no):  Age > 53 years old? Yes  Female? Yes  Able to take oral medications? Yes  Reports symptoms of acute cystitis: Dysuria/burning on urination, urinary urgency, or frequent voiding of small volume? Yes  If yes, list symptoms frequency, urgency, reports she has urinated about "30 times today since waking up"  No burning,  no hematuria, no odor  Sx Started 4 days ago, today is worse    Exclusion criteria (answer yes or no):  Nitrofurantoin allergy? No  Active malignancy undergoing treatment? No  Hospitalized or treated for cystitis within previous 3 months? No  Reports fever, flank pain, chills, or rigors? No  Abnormal vaginal discharge? Yes  white vaginal d/c  no itching,   Pregnant?No  History of urologic abnormality? No  Chronic kidney disease? No    Cheryl Zimmerman does not meet all of the inclusion criteria with no exclusion criteria. A prescription for nitrofurantoin (monohydrate/macrocrystals) 100 mg BID was sent to patient's preferred pharmacy.  Advised patient to call back/seek care for continued or worsening symptoms.   Reason for Disposition   Urinating more frequently than usual (i.e., frequency) OR new-onset of the feeling of an urgent need to urinate (i.e., urgency)    Protocols used: Urinary Symptoms-A-OH  Advised per nursing triage protocol.      Recommended disposition for patient:Disposition: Scheduled Televisit Appointment    If patient referred to UC/ED advised that they may require further follow up and testing after the visit with their primary care office.     Instructed patient to call back for any new, worsening, or worrisome symptoms or concerns any time day or night.

## 2023-12-04 NOTE — Telephone Encounter (Signed)
-----   Message from Aviva Signs sent at 12/04/2023  2:07 PM EDT -----  Regarding: Please help schedule RN visit/ [facility] visit  This patient presented via televist for concern for UTI though symptoms are only increased frequency.   Would be best to test this patient for vitals, UTI, A1c with nurse visit though patient lived 1.5 hours away and would have a difficult time scheduling a ride.   Please offer patient a nursing/lab visit with PTI or please offer her to get tested at [facility] near where she lives:  [facility], [address] [place], [place] [address]  Thank you.  Mervyn Gay

## 2023-12-04 NOTE — Progress Notes (Unsigned)
 Resident Primary Televisit Care Note      SUBJECTIVE  Cheryl Zimmerman is a 53 year old English-speaking female here for:    Chief concern(s): No chief complaint on file.     5-days urinating "out of this world"  Constantly going, 20 times today   Large volume of urine  No increased thirst  No pain or burning  No increased feet swelling   Bumex recently decreased to 1mg  from 2mg   A while back has a UTI with cloudy urine and pain   This does not feel like previous UTI as she does not have any dysuria   No fevers, no chills  No nausea or vomitting  No abdominal pain   Dizziness from ear infection that is being treated    Also has scant white vaginal discharge  No vaginal pain   Lives in [place]     ROS - Pertinent symptoms as noted above, other ROS negative  Review of Systems -       OBJECTIVE    Current Outpatient Medications:     atorvastatin (LIPITOR) 80 MG tablet, Take 1 tablet by mouth daily, Disp: 90 tablet, Rfl: 3    baclofen (LIORESAL) 10 MG tablet, Take 1 tablet by mouth 2 (two) times daily as needed, Disp: 60 tablet, Rfl: 0    budesonide-formoterol (SYMBICORT,BREYNA) 160-4.5 MCG/ACT inhaler, Inhale 2 puffs into the lungs in the morning and 2 puffs before bedtime., Disp: 6 g, Rfl: 2    bumetanide (BUMEX) 1 MG tablet, Take 1 tablet by mouth daily, Disp: 30 tablet, Rfl: 2    buprenorphine-naloxone (SUBOXONE) 8-2 MG sublingual film, Place 2 Film under the tongue in the morning and 2 Film before bedtime. Max Daily Amount: 4 Film., Disp: 120 Film, Rfl: 0    busPIRone (BUSPAR) 10 MG tablet, Take 1 tablet by mouth 3 (three) times daily, Disp: 180 tablet, Rfl: 0    cholecalciferol (VITAMIN D3) 25 MCG (1000 UT) tablet, Take 1 tablet by mouth daily, Disp: 30 tablet, Rfl: 1    clonazePAM (KLONOPIN) 1 MG tablet, Take 1 tablet by mouth nightly as needed for Anxiety, Disp: 30 tablet, Rfl: 0    loratadine (CLARITIN) 10 MG tablet, Take 1 tablet by mouth daily, Disp: 30 tablet, Rfl: 0    fluticasone (FLONASE) 50  MCG/ACT nasal spray, 2 sprays by Each Nostril route daily, Disp: 11.1 mL, Rfl: 1    gabapentin (NEURONTIN) 800 MG tablet, Take 1 tablet by mouth 3 (three) times daily, Disp: 90 tablet, Rfl: 0    lactulose (CHRONULAC) 20 GM/30 ML solution, Take 45 mLs by mouth as needed, Disp: 237 mL, Rfl: 0    levETIRAcetam (KEPPRA) 750 MG tablet, Take 1 tablet by mouth in the morning and 1 tablet before bedtime., Disp: 180 tablet, Rfl: 3    levothyroxine (SYNTHROID) 25 MCG tablet, Take 1 tablet by mouth every morning before breakfast, Disp: 90 tablet, Rfl: 3    magnesium gluconate (MAGONATE) 500 MG tablet, Take 1 tablet by mouth in the morning and 1 tablet before bedtime., Disp: 60 tablet, Rfl: 1    Multiple Vitamin (MULTIVITAMIN) tablet, Take 1 tablet by mouth daily, Disp: 90 tablet, Rfl: 3    nicotine polacrilex (NICORETTE) 4 MG gum, Take 1 each by mouth as needed for Craving (Nicotine), Disp: 100 tablet, Rfl: 11    OLANZapine (ZYPREXA) 5 MG tablet, Take 2 tablets by mouth daily, Disp: 60 tablet, Rfl: 2    omeprazole (PRILOSEC) 40 MG capsule,  Take 1 capsule by mouth daily, Disp: 90 capsule, Rfl: 3    OXcarbazepine (TRILEPTAL) 150 MG tablet, Take 1 tablet by mouth in the morning and 1 tablet before bedtime., Disp: 60 tablet, Rfl: 1    polyethylene glycol (GLYCOLAX/MIRALAX) 17 g packet, Take 1 packet by mouth daily, Disp: 90 packet, Rfl: 3    prazosin (MINIPRESS) 1 MG capsule, Take 1 capsule by mouth nightly, Disp: 30 capsule, Rfl: 0    spironolactone (ALDACTONE) 100 MG tablet, Take 1 tablet by mouth daily, Disp: 90 tablet, Rfl: 3    sucralfate (CARAFATE) 1 GM tablet, Take 1 tablet by mouth in the morning and 1 tablet at noon and 1 tablet in the evening and 1 tablet before bedtime., Disp: 360 tablet, Rfl: 3    ciprofloxacin-dexamethasone (CIPRODEX) otic suspension, Place 4 drops into the left ear in the morning and 4 drops before bedtime. Do all this for 14 days., Disp: 7.5 mL, Rfl: 0    acetaminophen (TYLENOL) 325 MG tablet, Take  2 tablets by mouth every 6 (six) hours as needed for Pain, Disp: 90 tablet, Rfl: 11    FLUoxetine (PROZAC) 40 MG capsule, Take 1 capsule by mouth daily, Disp: 30 capsule, Rfl: 2    FLUoxetine (PROZAC) 10 MG tablet, Take 1 tablet by mouth daily, Disp: 30 tablet, Rfl: 2    mirtazapine (REMERON) 15 MG tablet, Take 1 tablet by mouth nightly, Disp: 30 tablet, Rfl: 2    mirtazapine (REMERON) 7.5 MG tablet, Take 1 tablet by mouth nightly, Disp: 30 tablet, Rfl: 0    OTHER MEDICATION, Electric wheelchair For use as directed.  DX: Neuropathy ICD10:G60  Please send to [facility] Fax # [phone], Disp: 1 each, Rfl: 0    Most Recent BP Reading(s)  11/29/23 : (!) 143/81  10/25/23 : 134/80  06/02/23 : (!) 157/100  02/01/23 : 117/79  12/31/22 : 111/70        Complete exam deferred due to televisit:    Sounds comfortable, not in acute distress   Affect: normal with normal range  Thought process and content normal  Breathing unlabored     I personally reviewed relevant labs, imaging, and studies in EpicCare.       ASSESSMENT AND PLAN  Cheryl Zimmerman is a 53 year old female who presents via televisit with with urinary frequency      # Urinary frequency  #Polyuria   Patient presents with 5 days of frequent, large-volume urination without polydipsia. No dysuria or suprapubic pain to suggest UTI. Does not feel like prior UTI. Reports scan white vaginal discharge. A1c 6.5 1 year ago, no official diagnosis of diabetes. Has risk factors of progression with obesity, olanzapine use. Possible osmotic diuresis from glucosuria vs diabetes insipidus due to Keppra. No recent increases to diuretic, in fact Bumex was decreased. Possible urinary frequency due to vaginitis with report of discharge.  Would not treat empirically for UTI, patient should present for testing. Notes that she lives 1.5 hours away and cannot come in; possible to present to [facility] near her home. RN to call to schedule.   - URINALYSIS RFLX TO URINE CULT;  Future  - BASIC METABOLIC PANEL; Future  - HEMOGLOBIN A1C; Future    Follow up for a lab-only visit, then approximately 1 month    ____________    Discussed with Attending Dr. Clovis Riley, MD, 12/04/2023  Resident Physician

## 2023-12-05 NOTE — Telephone Encounter (Signed)
 [facility] received an order for [employer]. This has been forwarded to [facility] and scanned into Careers information officer. Due to COVID-19 [facility] is using provider's signature on file to submit DME requests.    Please note DME requests can take 4 to 6 weeks to receive a decision.       For any updates, patient should call supply company directly at [phone]

## 2023-12-07 NOTE — Progress Notes (Signed)
 On the day of the patient's visit, I personally examined and interviewed the patient. I also personally reviewed, with the resident, the key elements of history, physical exam, and findings as described in the resident's note. I agree with the assessment and plan as described in the resident's note unless noted below.    Levon Hedger, MD  Pager [phone]

## 2023-12-11 ENCOUNTER — Encounter (HOSPITAL_BASED_OUTPATIENT_CLINIC_OR_DEPARTMENT_OTHER): Payer: Self-pay

## 2023-12-11 NOTE — Prior Authorization (Signed)
 Patient Insurance: [id number] [id number] [facility] PCN [id number])  Member ID: [id number]  BIN: [id number]  PCN: [id number]  Group: [id number]  Prior Authorization for: suboxone    The ins  decline do to qty limitation the max is 3 a day

## 2023-12-13 ENCOUNTER — Telehealth (HOSPITAL_BASED_OUTPATIENT_CLINIC_OR_DEPARTMENT_OTHER): Payer: Self-pay

## 2023-12-13 NOTE — Telephone Encounter (Signed)
-----   Message from Gates S sent at 12/13/2023  9:22 AM EDT -----  Regarding: urgently required, letter for DME  Cheryl Zimmerman [id number], 53 year old, female    Calls today: Letters  Other    - need letter saying it is safe for patient to operate wheelchair due to history of seizures   fax to [facility],   [phone]      Johnnette Litter    [phone]  (She is going to patient's home tomorrow to teach wheelchair use)  Home address confirmed Yes    Person calling on behalf of patient: Patient (self)    Cleotis Lema NUMBER: [phone]  Best time to call back:   Cell phone:   Other phone:    Patient's language of care: English    Patient does not need an interpreter.    Patient's PCP: [name], MD    Primary Care Home Site:  [facility]

## 2023-12-13 NOTE — Telephone Encounter (Signed)
 [facility]  Call made to Cheryl Zimmerman to schedule an appointment for her get a letter saying it is safe for patient to operate wheelchair due to history of seizures . No interpreter needed.   Patient (self) did not answer at [phone] ; this team member left a message asking for a call back.  I have completed the following communication and reminder steps: Patient notified by telephone  Cheryl Zimmerman, 12/13/2023    On behalf of PCP: [name], MD

## 2023-12-15 NOTE — Prior Authorization (Signed)
 CMM:    Prior authorization was done online through Cover My Meds.  The following information was provided.     Waiting for the insurance company to provide an approval/denial notice.  This may take between 24 - 72 hours.     COVER MY MEDS KEY: Hessie Dibble

## 2023-12-18 NOTE — Prior Authorization (Signed)
 CMM:    Prior authorization was done online through Cover My Meds.  The following information was provided.     Waiting for the insurance company to provide an approval/denial notice.  This may take between 24 - 72 hours.     COVER MY MEDS KEY: [id number]                Status:    Prior Authorization Status: Approved  Type: Pharmacy       Pharmacy:    Start Date: 09/20/23  End Date: 12/14/24  Spoke to: left v/m     The patient and their preferred pharmacy are aware of the approval.  Approval notice has been scanned into Careers information officer.

## 2023-12-25 LAB — HEMOGLOBIN A1C CARE EVERYWHERE
ESTIMATED AVERAGE GLUCOSE CARE EVERYWHERE: 120 mg/dL
HEMOGLOBIN A1C CARE EVERYWHERE: 5.8 % — ABNORMAL HIGH (ref <5.7)

## 2023-12-26 ENCOUNTER — Other Ambulatory Visit (HOSPITAL_BASED_OUTPATIENT_CLINIC_OR_DEPARTMENT_OTHER): Payer: Self-pay

## 2023-12-26 DIAGNOSIS — F1120 Opioid dependence, uncomplicated: Secondary | ICD-10-CM

## 2023-12-26 NOTE — Telephone Encounter (Signed)
 PER Pharmacy, Cheryl Zimmerman is a 53 year old female has requested a refill of   loratadine (CLARITIN) 10 MG tablet  .    Last Office Visit: 12/04/2023    Other Med Adult:  Most Recent BP Reading(s)  11/29/23 : (!) 143/81        Cholesterol (mg/dL)   Date Value   63/09/6008 263 (H)     LOW DENSITY LIPOPROTEIN DIRECT (mg/dL)   Date Value   93/23/5573 192 (H)     HIGH DENSITY LIPOPROTEIN (mg/dL)   Date Value   22/10/5425 40     TRIGLYCERIDES (mg/dL)   Date Value   03/12/7627 301 (H)         THYROID SCREEN TSH REFLEX FT4 (uIU/mL)   Date Value   05/31/2023 2.110         TSH (THYROID STIM HORMONE) (uIU/mL)   Date Value   12/10/2022 1.190       HEMOGLOBIN A1C (%)   Date Value   07/15/2022 6.5 (H)       No results found for: "POCA1C"      INR (no units)   Date Value   06/02/2011 1.0 (L)   06/02/2011 < 1.0 (*L)     INR CARE EVERYWHERE (no units)   Date Value   10/16/2021 1.0   09/23/2021 0.9   01/06/2021 1.1   11/11/2020 1.0   07/05/2020 0.9   07/04/2020 0.8 (L)   03/13/2020 1.0       SODIUM (mmol/L)   Date Value   05/31/2023 138       POTASSIUM (mmol/L)   Date Value   05/31/2023 4.0           CREATININE (mg/dL)   Date Value   31/51/7616 0.8       Documented patient preferred pharmacies:    [id number], [place] - [address]  Phone: [phone] Fax: [phone]

## 2023-12-26 NOTE — Telephone Encounter (Signed)
 PER Pharmacy, Cheryl Zimmerman is a 53 year old female has requested a refill of   clonazePAM (KLONOPIN) 1 MG tablet  .  Start Date: 11/29/23 End Date: 12/29/23     gabapentin (NEURONTIN) 800 MG tablet      magnesium gluconate (MAGONATE) 500 MG tablet     Last Office Visit: 10/25/2023    Other Med Adult:  Most Recent BP Reading(s)  11/29/23 : (!) 143/81        Cholesterol (mg/dL)   Date Value   16/06/9603 263 (H)     LOW DENSITY LIPOPROTEIN DIRECT (mg/dL)   Date Value   54/05/8118 192 (H)     HIGH DENSITY LIPOPROTEIN (mg/dL)   Date Value   14/78/2956 40     TRIGLYCERIDES (mg/dL)   Date Value   21/30/8657 301 (H)         THYROID SCREEN TSH REFLEX FT4 (uIU/mL)   Date Value   05/31/2023 2.110         TSH (THYROID STIM HORMONE) (uIU/mL)   Date Value   12/10/2022 1.190       HEMOGLOBIN A1C (%)   Date Value   07/15/2022 6.5 (H)       No results found for: "POCA1C"      INR (no units)   Date Value   06/02/2011 1.0 (L)   06/02/2011 < 1.0 (*L)     INR CARE EVERYWHERE (no units)   Date Value   10/16/2021 1.0   09/23/2021 0.9   01/06/2021 1.1   11/11/2020 1.0   07/05/2020 0.9   07/04/2020 0.8 (L)   03/13/2020 1.0       SODIUM (mmol/L)   Date Value   05/31/2023 138       POTASSIUM (mmol/L)   Date Value   05/31/2023 4.0           CREATININE (mg/dL)   Date Value   84/69/6295 0.8       Documented patient preferred pharmacies:    [id number] [place],  - [address]  Phone: [phone] Fax: [phone]

## 2023-12-27 ENCOUNTER — Other Ambulatory Visit (HOSPITAL_BASED_OUTPATIENT_CLINIC_OR_DEPARTMENT_OTHER): Payer: Self-pay

## 2023-12-27 NOTE — Telephone Encounter (Signed)
 Per Pharmacy, Cheryl Zimmerman is a 53 year old female has requested a refill of        FLUOXETINE.       Last Office Visit: 11/29/2023 with PCP  Last Physical Exam:      There are no preventive care reminders to display for this patient.    Other Med Adult:  Most Recent BP Reading(s)  11/29/23 : (!) 143/81        Cholesterol (mg/dL)   Date Value   16/06/9603 263 (H)     LOW DENSITY LIPOPROTEIN DIRECT (mg/dL)   Date Value   54/05/8118 192 (H)     HIGH DENSITY LIPOPROTEIN (mg/dL)   Date Value   14/78/2956 40     TRIGLYCERIDES (mg/dL)   Date Value   21/30/8657 301 (H)         THYROID SCREEN TSH REFLEX FT4 (uIU/mL)   Date Value   05/31/2023 2.110         TSH (THYROID STIM HORMONE) (uIU/mL)   Date Value   12/10/2022 1.190       HEMOGLOBIN A1C (%)   Date Value   07/15/2022 6.5 (H)       No results found for: "POCA1C"      INR (no units)   Date Value   06/02/2011 1.0 (L)   06/02/2011 < 1.0 (*L)     INR CARE EVERYWHERE (no units)   Date Value   10/16/2021 1.0   09/23/2021 0.9   01/06/2021 1.1   11/11/2020 1.0   07/05/2020 0.9   07/04/2020 0.8 (L)   03/13/2020 1.0       SODIUM (mmol/L)   Date Value   05/31/2023 138       POTASSIUM (mmol/L)   Date Value   05/31/2023 4.0           CREATININE (mg/dL)   Date Value   84/69/6295 0.8       Documented patient preferred pharmacies:    [id number] [facility] [place] - [address]  Phone: [phone] Fax: [phone]

## 2023-12-31 ENCOUNTER — Other Ambulatory Visit (HOSPITAL_BASED_OUTPATIENT_CLINIC_OR_DEPARTMENT_OTHER): Payer: Self-pay

## 2023-12-31 NOTE — Telephone Encounter (Signed)
 PER Pharmacy, Cheryl Zimmerman is a 53 year old female has requested a refill of     Gabapentin, flonase nasal spray.    Last OFFICE/TELE Visit:  11/29/2023 Chrys Cranker, MD     Last Physical Exam: Not on file.     There are no preventive care reminders to display for this patient.    Other Med Adult:  Most Recent BP Reading(s)  11/29/23 : (!) 143/81        Cholesterol (mg/dL)   Date Value   16/06/9603 263 (H)     LOW DENSITY LIPOPROTEIN DIRECT (mg/dL)   Date Value   54/05/8118 192 (H)     HIGH DENSITY LIPOPROTEIN (mg/dL)   Date Value   14/78/2956 40     TRIGLYCERIDES (mg/dL)   Date Value   21/30/8657 301 (H)         THYROID SCREEN TSH REFLEX FT4 (uIU/mL)   Date Value   05/31/2023 2.110         TSH (THYROID STIM HORMONE) (uIU/mL)   Date Value   12/10/2022 1.190       HEMOGLOBIN A1C (%)   Date Value   07/15/2022 6.5 (H)       No results found for: "POCA1C"      INR (no units)   Date Value   06/02/2011 1.0 (L)   06/02/2011 < 1.0 (*L)     INR CARE EVERYWHERE (no units)   Date Value   10/16/2021 1.0   09/23/2021 0.9   01/06/2021 1.1   11/11/2020 1.0   07/05/2020 0.9   07/04/2020 0.8 (L)   03/13/2020 1.0       SODIUM (mmol/L)   Date Value   05/31/2023 138       POTASSIUM (mmol/L)   Date Value   05/31/2023 4.0           CREATININE (mg/dL)   Date Value   84/69/6295 0.8       Documented patient preferred pharmacies:    [id number], [place] - [address]  Phone: [phone] Fax: [phone]

## 2024-01-10 ENCOUNTER — Ambulatory Visit (HOSPITAL_BASED_OUTPATIENT_CLINIC_OR_DEPARTMENT_OTHER)

## 2024-01-10 ENCOUNTER — Ambulatory Visit

## 2024-01-11 ENCOUNTER — Ambulatory Visit

## 2024-01-11 NOTE — Progress Notes (Deleted)
 RESIDENT PRIMARY CARE NOTE       SUBJECTIVE  Cheryl Zimmerman is a 53 year old English-speaking female here for:    Chief concern(s):     #[facility] EEG last week normal, no seizure one yr    PT evaluated her, PT approved to get chair     Statement that safe to have wheelchair despite seizures  -Knute Perla   Fax: [phone]  [facility] - Fax: [phone]    Primary care appt, June    ROS - Pertinent symptoms as noted above, other ROS negative    Active Medical Issues:  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     (HFpEF) heart failure with preserved ejection fraction ([name])     MDD (major depressive disorder), recurrent, severe, with psychosis ([name])     Requires continuous at home supplemental oxygen      Spinal stenosis of lumbar 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 claudication     Fall in home     Gait instability     Hypokalemia     Lung nodule     Stable angina ([name])     Chest pain, atypical     Obesity hypoventilation syndrome ([name])     Osteoarthritis of spine with radiculopathy, lumbar region     Chronic bilateral low back pain with bilateral sciatica     Hypoxia     Alcohol  withdrawal syndrome, uncomplicated ([name])     Cocaine use     Homicidal ideation     Somnolence     Obstructive sleep apnea syndrome     Acute metabolic encephalopathy     B12 deficiency     Vitamin D  deficiency      Past medical, surgical, and social history reviewed.       OBJECTIVE  There were no vitals taken for this visit.  Most Recent Weight Reading(s)  05/29/23 : (!) 141.1 kg (311 lb  1.1 oz)  02/01/23 : (!) 146 kg (321 lb 14 oz)  12/31/22 : (!) 155.2 kg (342 lb 1.6 oz)  12/11/22 : (!) 158.8 kg (350 lb)  12/10/22 : (!) 158.8 kg (350 lb)    Most Recent BP Reading(s)  11/29/23 : (!) 143/81  10/25/23 : 134/80  06/02/23 : (!) 157/100  02/01/23 : 117/79  12/31/22 : 111/70      Physical Exam      I personally reviewed relevant labs, imaging, and studies in EpicCare.       ASSESSMENT AND PLAN  Cheryl Zimmerman is a 53 year old female who presents with ***     (Associate diagnoses for visit then pull in .DIAG to document A/P or .PROBDIAG if problem-based charting)    Follow up in approximately ***    {Document time spent to support E&M coding:12984}  ____________    Discussed with Attending Dr. Chrys Cranker, MD, 01/11/2024  Resident Physician     ***Update HM, refresh all

## 2024-01-17 ENCOUNTER — Ambulatory Visit: Attending: Family Medicine

## 2024-01-17 ENCOUNTER — Ambulatory Visit (HOSPITAL_BASED_OUTPATIENT_CLINIC_OR_DEPARTMENT_OTHER)

## 2024-01-17 ENCOUNTER — Other Ambulatory Visit: Payer: Self-pay

## 2024-01-17 VITALS — BP 139/84 | HR 88 | Temp 97.6°F

## 2024-01-17 DIAGNOSIS — G894 Chronic pain syndrome: Secondary | ICD-10-CM | POA: Insufficient documentation

## 2024-01-17 DIAGNOSIS — F1120 Opioid dependence, uncomplicated: Secondary | ICD-10-CM

## 2024-01-17 DIAGNOSIS — F1320 Sedative, hypnotic or anxiolytic dependence, uncomplicated: Secondary | ICD-10-CM | POA: Diagnosis not present

## 2024-01-17 LAB — METHADONE URINE: METHADONE URINE: NEGATIVE ng/mL

## 2024-01-17 LAB — AMPHETAMINES URINE: AMPHETAMINES URINE: NEGATIVE ng/mL

## 2024-01-17 LAB — CANNABINOIDS URINE: CANNABINOIDS URINE: NEGATIVE ng/mL

## 2024-01-17 LAB — FENTANYL URINE: FENTANYL URINE: NEGATIVE ng/mL

## 2024-01-17 LAB — BENZODIAZEPINES URINE: BENZODIAZEPINES URINE: NEGATIVE ng/mL

## 2024-01-17 LAB — OXYCODONE SCREEN URINE: OXYCOD SCRN URINE: NEGATIVE ng/mL

## 2024-01-17 LAB — COCAINE METABOLITES URINE: COCAINE METABOLITES URINE: NEGATIVE ng/mL

## 2024-01-17 LAB — SPECIMEN VALIDITY URINE
SPEC VALIDITY SPECIFIC GRAVITY: 1.01 (ref 1.003–1.035)
SPECIMEN VALIDITY URINE CREAT: 103 mg/dL (ref >20)
SPECIMEN VALIDITY URINE PH: 6.9 (ref 5.0–8.5)

## 2024-01-17 LAB — OPIATES URINE: OPIATES URINE: NEGATIVE ng/mL

## 2024-01-17 LAB — BUPRENORPHINE SCREEN URINE: BUPRENORPHINE SCREEN URINE: POSITIVE ng/mL — AB

## 2024-01-17 NOTE — Progress Notes (Signed)
 RESIDENT PRIMARY CARE NOTE       SUBJECTIVE  Cheryl Zimmerman is a 53 year old English-speaking female here for:    Chief concern(s): Follow Up    #chronic pain  -doing relatively well on suboxone  32mg  daily  -ideally would like to transition to methadone  and we discussed opportunities to do so [place], [place]  -Planning to discuss this with new psychiatrist and PCP    #Transitioning care  -has new PCP appointment June 2nd @ 3PM  -w/ Butch Cashing      ROS - Pertinent symptoms as noted above, other ROS negative    Active Medical Issues:  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     (HFpEF) heart failure with preserved ejection fraction ([name])     MDD (major depressive disorder), recurrent, severe, with psychosis ([name])     Requires continuous at home supplemental oxygen      Spinal stenosis of lumbar 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 claudication     Fall in home     Gait instability     Hypokalemia     Lung nodule     Stable angina ([name])     Chest pain, atypical     Obesity hypoventilation syndrome ([name])     Osteoarthritis of spine with radiculopathy, lumbar region     Chronic bilateral low back pain with bilateral sciatica     Hypoxia     Alcohol  withdrawal syndrome, uncomplicated ([name])     Cocaine  use     Homicidal ideation     Somnolence     Obstructive sleep apnea syndrome     Acute metabolic encephalopathy     B12 deficiency     Vitamin D  deficiency      Past medical, surgical, and social history reviewed.       OBJECTIVE  BP 139/84 (Site: LA, Position:  Sitting, Cuff Size: Reg)   Pulse 88   Temp 97.6 F (36.4 C) (Temporal)   SpO2 96%   Most Recent Weight Reading(s)  05/29/23 : (!) 141.1 kg (311 lb 1.1 oz)  02/01/23 : (!) 146 kg (321 lb 14 oz)  12/31/22 : (!) 155.2 kg (342 lb 1.6 oz)  12/11/22 : (!) 158.8 kg (350 lb)  12/10/22 : (!) 158.8 kg (350 lb)      Most Recent BP Reading(s)  01/17/24 : 139/84  11/29/23 : (!) 143/81  10/25/23 : 134/80  06/02/23 : (!) 157/100  02/01/23 : 117/79      Physical Exam  Constitutional:       General: She is not in acute distress.     Appearance: Normal appearance. She is not ill-appearing, toxic-appearing or diaphoretic.   HENT:      Head: Normocephalic and atraumatic.      Nose: No congestion or rhinorrhea.   Eyes:      General: No scleral icterus.  Right eye: No discharge.         Left eye: No discharge.      Conjunctiva/sclera: Conjunctivae normal.   Cardiovascular:      Rate and Rhythm: Normal rate.   Pulmonary:      Effort: Pulmonary effort is normal. No respiratory distress.   Abdominal:      General: There is no distension.   Skin:     General: Skin is warm and dry.   Neurological:      Mental Status: She is alert.      Comments: Grossly intact   Psychiatric:         Mood and Affect: Mood normal.         Behavior: Behavior normal.         Thought Content: Thought content normal.         Judgment: Judgment normal.           I personally reviewed relevant labs, imaging, and studies in EpicCare.       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 Plan to consider establishing with methadone  clinic near [place] as transitions care down there in the coming month. New provider will take over opioid and benzodiazepine rx, though have  made televisit appt in 5 weeks to confirm has established with new provider (June 2nd). Pt and myself spoke with new clinic staff over phone today, provider was OOO though left phone number and requested call back for warm handoff  Plan: AMPHETAMINES URINE, BENZODIAZEPINES URINE,         BUPRENORPHINE  SCREEN URINE, COCAINE  METABOLITES        URINE, METHADONE  URINE, OPIATES URINE,         OXYCODONE  SCREEN URINE, CANNABINOIDS URINE,         FENTANYL  URINE, SPECIMEN VALIDITY URINE      Follow up via televisit 5 weeks    Discussed with Attending Dr. Euell Herrlich, MD  Chrys Cranker, MD, 01/17/2024    Resident Physician

## 2024-01-17 NOTE — Progress Notes (Signed)
 PRECEPTOR NOTE  On the day of the patient's visit, I personally saw and evaluated the patient. In addition, I reviewed findings with the resident. I confirm the key elements of history and physical exam as described in resident's note.  I agree with the assessment and plan as described below.  Please see resident's note for further details.

## 2024-01-18 ENCOUNTER — Other Ambulatory Visit (HOSPITAL_BASED_OUTPATIENT_CLINIC_OR_DEPARTMENT_OTHER): Payer: Self-pay

## 2024-01-18 NOTE — Telephone Encounter (Signed)
 PER Pharmacy, Cheryl Zimmerman is a 53 year old female has requested a refill of melatonin.      Last OFFICE Visit:  01/17/2024 Chrys Cranker, MD     Last TELE Visit: Recent Visits  Date Type Provider Dept   12/04/23 Rea Lockhart, MD [facility]   10/16/23 Elijio Guadeloupe, MD [facility]   05/23/23 Televisit Mertie Abt, APRN [facility]   01/26/23 Aundria Bloom, Severa Daniels, MD [facility]   Showing recent visits within past 365 days with a meds authorizing provider and meeting all other requirements  Future Appointments  No visits were found meeting these conditions.  Showing future appointments within next 0 days with a meds authorizing provider and meeting all other requirements      Last Physical Exam: Not on file.     There are no preventive care reminders to display for this patient.    Other Med Adult:  Most Recent BP Reading(s)  01/17/24 : 139/84        Cholesterol (mg/dL)   Date Value   09/60/4540 263 (H)     LOW DENSITY LIPOPROTEIN DIRECT (mg/dL)   Date Value   98/07/9146 192 (H)     HIGH DENSITY LIPOPROTEIN (mg/dL)   Date Value   82/95/6213 40     TRIGLYCERIDES (mg/dL)   Date Value   08/65/7846 301 (H)         THYROID  SCREEN TSH REFLEX FT4 (uIU/mL)   Date Value   05/31/2023 2.110         TSH (THYROID  STIM HORMONE) (uIU/mL)   Date Value   12/10/2022 1.190       HEMOGLOBIN A1C (%)   Date Value   07/15/2022 6.5 (H)       No results found for: "POCA1C"      INR (no units)   Date Value   06/02/2011 1.0 (L)   06/02/2011 < 1.0 (*L)     INR CARE EVERYWHERE (no units)   Date Value   10/16/2021 1.0   09/23/2021 0.9   01/06/2021 1.1   11/11/2020 1.0   07/05/2020 0.9   07/04/2020 0.8 (L)   03/13/2020 1.0       SODIUM (mmol/L)   Date Value   05/31/2023 138       POTASSIUM (mmol/L)   Date Value   05/31/2023 4.0           CREATININE (mg/dL)   Date Value   96/29/5284 0.8       Documented patient preferred pharmacies:    [id number], [place] - [address]  Phone: [phone] Fax:  [phone]

## 2024-01-20 ENCOUNTER — Other Ambulatory Visit (HOSPITAL_BASED_OUTPATIENT_CLINIC_OR_DEPARTMENT_OTHER): Payer: Self-pay

## 2024-01-20 NOTE — Telephone Encounter (Signed)
 PER Pharmacy, Cheryl Zimmerman is a 53 year old female has requested a refill of Prazosin  1 mg & Mirtazapine  15 mg.      Last Office Visit: 01/17/2024 with PCP  Last Physical Exam: N/A      Other Med Adult:  Most Recent BP Reading(s)  01/17/24 : 139/84        Cholesterol (mg/dL)   Date Value   20/25/4270 263 (H)     LOW DENSITY LIPOPROTEIN DIRECT (mg/dL)   Date Value   62/37/6283 192 (H)     HIGH DENSITY LIPOPROTEIN (mg/dL)   Date Value   15/17/6160 40     TRIGLYCERIDES (mg/dL)   Date Value   73/71/0626 301 (H)         THYROID  SCREEN TSH REFLEX FT4 (uIU/mL)   Date Value   05/31/2023 2.110         TSH (THYROID  STIM HORMONE) (uIU/mL)   Date Value   12/10/2022 1.190       HEMOGLOBIN A1C (%)   Date Value   07/15/2022 6.5 (H)       No results found for: "POCA1C"      INR (no units)   Date Value   06/02/2011 1.0 (L)   06/02/2011 < 1.0 (*L)     INR CARE EVERYWHERE (no units)   Date Value   10/16/2021 1.0   09/23/2021 0.9   01/06/2021 1.1   11/11/2020 1.0   07/05/2020 0.9   07/04/2020 0.8 (L)   03/13/2020 1.0       SODIUM (mmol/L)   Date Value   05/31/2023 138       POTASSIUM (mmol/L)   Date Value   05/31/2023 4.0           CREATININE (mg/dL)   Date Value   94/85/4627 0.8       Documented patient preferred pharmacies:    [id number], [place] - [address]  Phone: [phone] Fax: [phone]

## 2024-01-24 ENCOUNTER — Telehealth (HOSPITAL_BASED_OUTPATIENT_CLINIC_OR_DEPARTMENT_OTHER): Payer: Self-pay

## 2024-01-24 NOTE — Telephone Encounter (Signed)
 Received a call from [facility] stating that they need a new prescription to be send in for the wheelchair since the one on file expired. Requesting the following requirements:    1- Write the prescription for a Power Mobility Device Not for wheel chair  2- Include a letter from the provider that the patient is safe to operate the device given her hx of seizures     Fax # [phone]

## 2024-01-26 ENCOUNTER — Other Ambulatory Visit (HOSPITAL_BASED_OUTPATIENT_CLINIC_OR_DEPARTMENT_OTHER): Payer: Self-pay

## 2024-01-26 DIAGNOSIS — F1120 Opioid dependence, uncomplicated: Secondary | ICD-10-CM

## 2024-01-26 NOTE — Telephone Encounter (Signed)
 New RX for electric wheelchair needed, per [facility] these RX's are only valid for 30 days.    Please review,    Thanks!

## 2024-01-26 NOTE — Telephone Encounter (Signed)
 Confirmed phramcy has refll

## 2024-01-26 NOTE — Telephone Encounter (Signed)
 PER Pharmacy, Cheryl Zimmerman is a 53 year old female has requested a refill of      -  suboxone , clonazepam ,  robaxin , baclofen  and nicorette  gum       Last Office Visit: 01/17/24 with pcp  Last Physical Exam:      There are no preventive care reminders to display for this patient.     Other Med Adult:  Most Recent BP Reading(s)  01/17/24 : 139/84        Cholesterol (mg/dL)   Date Value   69/62/9528 263 (H)     LOW DENSITY LIPOPROTEIN DIRECT (mg/dL)   Date Value   41/32/4401 192 (H)     HIGH DENSITY LIPOPROTEIN (mg/dL)   Date Value   02/72/5366 40     TRIGLYCERIDES (mg/dL)   Date Value   44/11/4740 301 (H)         THYROID  SCREEN TSH REFLEX FT4 (uIU/mL)   Date Value   05/31/2023 2.110         TSH (THYROID  STIM HORMONE) (uIU/mL)   Date Value   12/10/2022 1.190       HEMOGLOBIN A1C (%)   Date Value   07/15/2022 6.5 (H)       No results found for: "POCA1C"      INR (no units)   Date Value   06/02/2011 1.0 (L)   06/02/2011 < 1.0 (*L)     INR CARE EVERYWHERE (no units)   Date Value   10/16/2021 1.0   09/23/2021 0.9   01/06/2021 1.1   11/11/2020 1.0   07/05/2020 0.9   07/04/2020 0.8 (L)   03/13/2020 1.0       SODIUM (mmol/L)   Date Value   05/31/2023 138       POTASSIUM (mmol/L)   Date Value   05/31/2023 4.0           CREATININE (mg/dL)   Date Value   59/56/3875 0.8        Documented patient preferred pharmacies:    [id number], [place] - [address]  Phone: [phone] Fax: [phone]

## 2024-01-29 ENCOUNTER — Other Ambulatory Visit (HOSPITAL_BASED_OUTPATIENT_CLINIC_OR_DEPARTMENT_OTHER): Payer: Self-pay

## 2024-01-29 ENCOUNTER — Ambulatory Visit (HOSPITAL_BASED_OUTPATIENT_CLINIC_OR_DEPARTMENT_OTHER): Payer: Self-pay

## 2024-01-29 DIAGNOSIS — F1120 Opioid dependence, uncomplicated: Secondary | ICD-10-CM

## 2024-01-29 NOTE — Telephone Encounter (Signed)
 Adult Dermatology TriagePatient  I spoke with   Chief Complaint:  reports rash in fold of abd. Pt noticed today. Rash in on the right side. Denies blisters or breaks in the skin. Area is sore. 6/10. Denies fevers. Pt reports similar rash under breast which was fungus. Pt reports similar sx's  Area is itchy ? Fungal . Pt out of RX powder used in the past. Denies resp distress or widespread rash. Pt is also looking for refill of suboxone . 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     (HFpEF) heart failure with preserved ejection fraction ([name])     MDD (major depressive disorder), recurrent, severe, with psychosis ([name])     Requires continuous at home supplemental oxygen      Spinal stenosis of lumbar 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 claudication     Fall in home     Gait instability     Hypokalemia     Lung nodule     Stable angina ([name])     Chest pain, atypical     Obesity hypoventilation syndrome ([name])     Osteoarthritis of spine with radiculopathy, lumbar region     Chronic bilateral low back pain with bilateral sciatica     Hypoxia     Alcohol  withdrawal syndrome, uncomplicated ([name])     Cocaine  use     Homicidal ideation     Somnolence     Obstructive sleep apnea syndrome     Acute metabolic encephalopathy     B12 deficiency     Vitamin D  deficiency        Review of Patient's Allergies indicates:   Bee venom               Anaphylaxis   Metolazone               Other (See Comments)     Comment:Hypokalemia   Trazodone                Hives   Methylprednisolone      Dizziness, Drowsiness    Comment:Also believes syncope. Seems to tolerate             inhalational and epidural steroid.   Nutritional supplem*       Tegretol [carbamaze*    Hives   Hydroxyzine              Nausea Only  Emergency Care:     Is the patient gasping for air or unable to speak?  No    Does the patient have new severe chest pain? No    Is the patient lethargic or does the patient have altered mental status? No    The patient has the following symptoms:     Fever: Denies  If fever, duration of fever: N/A  Location of  rash/condition? Skin fold abd  The rash/spot/lump or bump is localized in distribution, and can be described as redness  Worsening symptoms from baseline:       New medications, lotions, detergents, soaps? No    Home remedies tried: warm cloth    Advised per nursing triage protocol.  Verbalized understanding and agreement with instructions and disposition.     Recommended disposition for patient:  Disposition: See in Office within 2 weeks     If patient referred to UC/ED advised that they may require further follow up and testing after the visit with their primary care office.      Instructed patient to call back for any new, worsening, or worrisome symptoms or concerns any time day or night.    Telephone Call Outcome:  Single Call Resolution      Reason for Disposition   Red, moist, irritated area between skin folds (or under larger breasts)    Protocols used: Rash or Redness - Localized-A-OH

## 2024-01-29 NOTE — Telephone Encounter (Signed)
 PER Pharmacy, Cheryl Zimmerman is a 53 year old female has requested a refill of suboxone      Last prescribed - start date: 3/12 end date: 4/11        Last Office Visit: 01/17/2024  Last Physical Exam: Not on file.        Other Med Adult:  Most Recent BP Reading(s)  01/17/24 : 139/84        Cholesterol (mg/dL)   Date Value   16/06/9603 263 (H)     LOW DENSITY LIPOPROTEIN DIRECT (mg/dL)   Date Value   54/05/8118 192 (H)     HIGH DENSITY LIPOPROTEIN (mg/dL)   Date Value   14/78/2956 40     TRIGLYCERIDES (mg/dL)   Date Value   21/30/8657 301 (H)         THYROID  SCREEN TSH REFLEX FT4 (uIU/mL)   Date Value   05/31/2023 2.110         TSH (THYROID  STIM HORMONE) (uIU/mL)   Date Value   12/10/2022 1.190       HEMOGLOBIN A1C (%)   Date Value   07/15/2022 6.5 (H)       No results found for: "POCA1C"      INR (no units)   Date Value   06/02/2011 1.0 (L)   06/02/2011 < 1.0 (*L)     INR CARE EVERYWHERE (no units)   Date Value   10/16/2021 1.0   09/23/2021 0.9   01/06/2021 1.1   11/11/2020 1.0   07/05/2020 0.9   07/04/2020 0.8 (L)   03/13/2020 1.0       SODIUM (mmol/L)   Date Value   05/31/2023 138       POTASSIUM (mmol/L)   Date Value   05/31/2023 4.0           CREATININE (mg/dL)   Date Value   84/69/6295 0.8       Documented patient preferred pharmacies:    [id number],  - [address]  Phone: [phone] Fax: [phone]  /

## 2024-01-29 NOTE — Telephone Encounter (Signed)
 Patient requesting refill on the same medication

## 2024-01-30 ENCOUNTER — Telehealth (HOSPITAL_BASED_OUTPATIENT_CLINIC_OR_DEPARTMENT_OTHER): Payer: Self-pay

## 2024-01-30 ENCOUNTER — Other Ambulatory Visit (HOSPITAL_BASED_OUTPATIENT_CLINIC_OR_DEPARTMENT_OTHER): Payer: Self-pay

## 2024-01-30 ENCOUNTER — Ambulatory Visit (HOSPITAL_BASED_OUTPATIENT_CLINIC_OR_DEPARTMENT_OTHER): Payer: Self-pay | Admitting: Internal Medicine

## 2024-01-30 DIAGNOSIS — F1120 Opioid dependence, uncomplicated: Secondary | ICD-10-CM

## 2024-01-30 MED ORDER — NYSTATIN 100000 UNIT/GM EX POWD
Freq: Four times a day (QID) | CUTANEOUS | 0 refills | Status: AC
Start: 2024-01-30 — End: 2024-02-09

## 2024-01-30 MED ORDER — BACLOFEN 10 MG PO TABS
10.0000 mg | ORAL_TABLET | Freq: Two times a day (BID) | ORAL | 0 refills | Status: DC | PRN
Start: 2024-01-30 — End: 2024-04-22

## 2024-01-30 MED ORDER — BUPRENORPHINE HCL-NALOXONE HCL 8-2 MG SL FILM
4.0000 | ORAL_FILM | Freq: Every day | SUBLINGUAL | 0 refills | Status: DC
Start: 2024-02-06 — End: 2024-05-09

## 2024-01-30 MED ORDER — METHOCARBAMOL 500 MG PO TABS
500.0000 mg | ORAL_TABLET | Freq: Two times a day (BID) | ORAL | 0 refills | Status: AC | PRN
Start: 2024-01-30 — End: 2024-02-09

## 2024-01-30 MED ORDER — CLONAZEPAM 1 MG PO TABS
1.0000 mg | ORAL_TABLET | Freq: Every evening | ORAL | 0 refills | Status: DC | PRN
Start: 2024-01-30 — End: 2024-05-09

## 2024-01-30 NOTE — Telephone Encounter (Signed)
 [facility]  Call made to Cheryl Zimmerman to  cancel televisit appointment on 01/31/2024 per Dr. Terie Fermo request     . No interpreter needed.   Patient (self) answered at [phone] ;  Patient informed that appt has been canceled. Patient agreed. .  I have completed the following communication and reminder steps: Patient notified by telephone  [name], 01/30/2024    On behalf of PCP: Chrys Cranker, MD

## 2024-01-30 NOTE — Progress Notes (Deleted)
 RESIDENT TELEVIIT NOTE       SUBJECTIVE:    Cheryl Zimmerman is a 53 year old English-speaking female with past medical history of?  OSA (not on CPAP), restrictive lung disease, HFpEF, morbid obesity polypharmacy and past admission with concern for sedation, complex PTSD, Suboxone  use for chronic pain who presents to clinic for:  #Concern for fungal rash:  # Impaired sugar control:  Last A1c was 6.5 in October 2023.  # Needs connection to primary care:  Will give warm handoff to new primary care provider/Dr. Terie Fermo  #Substance use:  Evaluate if relevant    ROS - Pertinent symptoms as noted above, other ROS negative    Active Medical Issues:  Possible restrictive lung disease  Dyspnea  Suicidal behavior with attempted self-injury  Depression  Seizures  Alcohol  use  Chronic pain  Morbid obesity  PTSD  HFpEF  Instability  Polypharmacy  Substance use      Past medical, surgical, and social history reviewed.     OBJECTIVE    Physical Exam:  Limited in view of televisit nature  GEN: No acute distress. Sitting/lying comfortably. Well developed, well nourished.   Skin: No lesions, rashes, ecchymosis, or petechiae.   Psych: Normal mood and affect.      Labs:  ***    Imaging:  ***    ASSESSMENT AND PLAN  Cheryl Zimmerman is a 53 year old female who presents for: ***     (Associate diagnoses for visit then pull in .DIAG to document A/P or .PROBDIAG if problem-based charting)    Pending issues to be followed up on a phone call/next visit ***  Follow up in approximately ***    ____________    Discussed with Attending Dr. Luane Rumps    Ferrel Hsu  PGY-3

## 2024-01-30 NOTE — Telephone Encounter (Signed)
 Adult General Triage    I spoke with Patient:     Chief Complaint: Diarrhea and prescription refill (Suboxone  and Clonazepam )     Current Day of Symptoms: 01/30/2024, today.     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     (HFpEF) heart failure with preserved ejection fraction ([name])     MDD (major depressive disorder), recurrent, severe, with psychosis ([name])     Requires continuous at home supplemental oxygen      Spinal stenosis of lumbar 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 claudication     Fall in home     Gait instability     Hypokalemia     Lung nodule     Stable angina ([name])     Chest pain, atypical     Obesity hypoventilation syndrome ([name])     Osteoarthritis of spine with radiculopathy, lumbar region     Chronic bilateral low back pain with bilateral sciatica     Hypoxia     Alcohol  withdrawal syndrome, uncomplicated ([name])     Cocaine  use     Homicidal ideation     Somnolence     Obstructive sleep apnea syndrome     Acute metabolic encephalopathy     B12 deficiency     Vitamin D  deficiency      Review of Patient's Allergies indicates:   Bee venom               Anaphylaxis   Metolazone               Other (See Comments)    Comment:Hypokalemia   Trazodone                Hives   Methylprednisolone      Dizziness, Drowsiness    Comment:Also believes syncope. Seems to tolerate             inhalational and epidural steroid.   Nutritional supplem*       Tegretol [carbamaze*    Hives   Hydroxyzine              Nausea Only    Emergency care:     Loss of Consciousness:  No      Chest Pain:  No     Severe Difficulty Breathing:  No     Concern for life threatening condition:  No     The patient has the following symptoms:     Diarrhea and prescription refill (Suboxone  and Clonazepam )     Pregnancy:  N/A     Home Remedies:  None expressed    Reports diarrhea feeling sweatiness X2 that started today, 01/30/2024 stating think cause of symptom is due to ran out of Suboxone  and Clonazepam  on yesterday, 01/29/2024 and awaiting prescription refill.     Denies chest pain, difficulty breathing, abdominal pain, fever, nausea, vomiting, blood in stool, problems/issues urination, dizziness, andy signs of  dehydration.  Denies traveled to a foreign country recently.  Denies been exposed to anyone with diarrhea.  Denies could you have eaten any food that was spoiled.  Denies taking antibiotics now or took antibiotics in the past 2 months.    Reports able to drink liquids.   Advised per nursing triage protocol.         Recommended disposition for patient:  Disposition: Provided Home Care Instructions and sent high priority message to Dr. Terie Fermo, PCP, and [facility] for input.  Writer also advised patient to go to nearest ED if experience signs and/or symptoms of withdrawal and patient verbalized understanding.     If patient referred to UC/ED advised that they may require further follow up and testing after the visit with their primary care office.     Instructed patient to call back for any new, worsening, or worrisome symptoms or concerns any time day or night.    Advised per nursing triage protocol.     Telephone Call Outcome:  Routed to Provider/Awaiting Response       HOME CARE:   * You should be able to treat this at home.  Patient verbalized understanding.  Patient desires prescription refill for Suboxone  and Clonazepam  stating ran out of them on yesterday, 01/29/2024 and has sent prescription request to Dr. Terie Fermo, PCP, and awaiting prescriptions to be sent to pharmacy and requests another hight  priority message be sent to Dr.[name] and writer will send to Dr. Terie Fermo per patient's request and will include [facility] on the message and patient verbalized understanding.      Writer also advised patient to go to nearest ED if experience any signs and/or symptoms of withdrawal and patient verbalized understanding.      Reason for Disposition   MILD-MODERATE diarrhea (e.g., 1-6 times / day more than normal)    Answer Assessment - Initial Assessment Questions  1. DIARRHEA SEVERITY: "How bad is the diarrhea?" "How many more stools have you had in the past 24 hours than normal?"     - NO DIARRHEA (SCALE 0)    - MILD (SCALE 1-3): Few loose or mushy BMs; increase of 1-3 stools over normal daily number of stools; mild increase in ostomy output.    -  MODERATE (SCALE 4-7): Increase of 4-6 stools daily over normal; moderate increase in ostomy output.    -  SEVERE (SCALE 8-10; OR "WORST POSSIBLE"): Increase of 7 or more stools daily over normal; moderate increase in ostomy output; incontinence.        2. ONSET: "When did the diarrhea begin?"         3. BM CONSISTENCY: "How loose or watery is the diarrhea?"         4. VOMITING: "Are you also vomiting?" If Yes, ask: "How many times in the past 24 hours?"         5. ABDOMEN PAIN: "Are you having any abdomen pain?" If Yes, ask: "What does it feel like?" (e.g., crampy, dull, intermittent, constant)         6. ABDOMEN PAIN SEVERITY: If present, ask: "How bad is the pain?"  (e.g., Scale 1-10; mild, moderate, or severe)    - MILD (1-3): doesn't interfere with normal activities, abdomen soft and not tender to touch     - MODERATE (4-7): interferes with normal activities or awakens from sleep, abdomen tender to touch     - SEVERE (8-10): excruciating pain, doubled over, unable to do any normal activities  7. ORAL INTAKE: If vomiting, "Have you been able to drink liquids?" "How much liquids have you had in the past 24 hours?"        8. HYDRATION: "Any signs of  dehydration?" (e.g., dry mouth [not just dry lips], too weak to stand, dizziness, new weight loss) "When did you last urinate?"        9. EXPOSURE: "Have you traveled to a foreign country recently?" "Have you been exposed to anyone with diarrhea?" "Could you have eaten any food that was spoiled?"        10. ANTIBIOTIC USE: "Are you taking antibiotics now or have you taken antibiotics in the past 2 months?"          11. OTHER SYMPTOMS: "Do you have any other symptoms?" (e.g., fever, blood in stool)    Protocols used: Diarrhea-A-OH

## 2024-01-30 NOTE — Telephone Encounter (Signed)
 Saw np first was given only one week of clonazepam , seeing psych at end of month. Was only given one weeks worth because waiting for psychiatrist to see pt end of month. Ran out today. Urine negative for benzo though this is sometimes seen with clonazepam , trusting pt's clear history and PDMP. Rx'd 30d clonazepam . Would recommend urine clonazepam  confirmatory testing in future.    Pt had mild opioid withdrawal 5/12 as was without suboxone  though did have 30 tablet rx/refill at pharmacy which just got, took two 8mg  this morning, feeling much better. Her current dose is 16mg  BID, 32mg  total daily dose, so the current supply of suboxone  will last her through the 20th AM. Provided 30d Rx starting 5/20, for 16mg  BID.    Requesting nystatin  powder for breast fungus rash, rx'd.

## 2024-01-30 NOTE — Telephone Encounter (Signed)
-----   Message from Chrys Cranker sent at 01/30/2024  4:05 PM EDT -----  Regarding: cancel televist tmrw  Hi,I just spoke w/ this pt on phone and provided rx/refills, she is doing well. Could we please cancel her televisit for tomorrow? She is planning to call to cancel too but she may forget/not get around to it. Argyle Began, MD

## 2024-01-31 ENCOUNTER — Ambulatory Visit (HOSPITAL_BASED_OUTPATIENT_CLINIC_OR_DEPARTMENT_OTHER)

## 2024-02-09 ENCOUNTER — Other Ambulatory Visit (HOSPITAL_BASED_OUTPATIENT_CLINIC_OR_DEPARTMENT_OTHER): Payer: Self-pay

## 2024-02-09 NOTE — Telephone Encounter (Signed)
 Per Pharmacy, Cheryl Zimmerman is a 53 year old female has requested a refill of        NICOTINE  pATCH.       Last Office Visit: 01/17/2024 with PCP  Last Physical Exam:      There are no preventive care reminders to display for this patient.    Other Med Adult:  Most Recent BP Reading(s)  01/17/24 : 139/84        Cholesterol (mg/dL)   Date Value   16/06/9603 263 (H)     LOW DENSITY LIPOPROTEIN DIRECT (mg/dL)   Date Value   54/05/8118 192 (H)     HIGH DENSITY LIPOPROTEIN (mg/dL)   Date Value   14/78/2956 40     TRIGLYCERIDES (mg/dL)   Date Value   21/30/8657 301 (H)         THYROID  SCREEN TSH REFLEX FT4 (uIU/mL)   Date Value   05/31/2023 2.110         TSH (THYROID  STIM HORMONE) (uIU/mL)   Date Value   12/10/2022 1.190       HEMOGLOBIN A1C (%)   Date Value   07/15/2022 6.5 (H)       No results found for: "POCA1C"      INR (no units)   Date Value   06/02/2011 1.0 (L)   06/02/2011 < 1.0 (*L)     INR CARE EVERYWHERE (no units)   Date Value   10/16/2021 1.0   09/23/2021 0.9   01/06/2021 1.1   11/11/2020 1.0   07/05/2020 0.9   07/04/2020 0.8 (L)   03/13/2020 1.0       SODIUM (mmol/L)   Date Value   05/31/2023 138       POTASSIUM (mmol/L)   Date Value   05/31/2023 4.0           CREATININE (mg/dL)   Date Value   84/69/6295 0.8       Documented patient preferred pharmacies:    [id number], [place] - [address]  Phone: [phone] Fax: [phone]

## 2024-02-14 ENCOUNTER — Ambulatory Visit (HOSPITAL_BASED_OUTPATIENT_CLINIC_OR_DEPARTMENT_OTHER)

## 2024-02-21 ENCOUNTER — Ambulatory Visit

## 2024-02-21 NOTE — Progress Notes (Unsigned)
 RESIDENT PRIMARY CARE NOTE       SUBJECTIVE  Cheryl Zimmerman is a 53 year old English-speaking female here for:    Chief concern(s): No chief complaint on file.     No answer x 2, left VM  ***  (Pull in .DIAGO if problem-list charting)    ROS - Pertinent symptoms as noted above, other ROS negative    Active Medical Issues:  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     (HFpEF) heart failure with preserved ejection fraction ([name])     MDD (major depressive disorder), recurrent, severe, with psychosis ([name])     Requires continuous at home supplemental oxygen      Spinal stenosis of lumbar 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 claudication     Fall in home     Gait instability     Hypokalemia     Lung nodule     Stable angina ([name])     Chest pain, atypical     Obesity hypoventilation syndrome ([name])     Osteoarthritis of spine with radiculopathy, lumbar region     Chronic bilateral low back pain with bilateral sciatica     Hypoxia     Alcohol  withdrawal syndrome, uncomplicated ([name])     Cocaine  use     Homicidal ideation     Somnolence     Obstructive sleep apnea syndrome     Acute metabolic encephalopathy     B12 deficiency     Vitamin D  deficiency      Past medical, surgical, and social history reviewed.       OBJECTIVE  There were no vitals taken for this visit.  Most Recent Weight Reading(s)  05/29/23 : (!) 141.1 kg (311 lb 1.1 oz)  02/01/23 : (!) 146 kg (321 lb 14 oz)  12/31/22 : (!) 155.2 kg (342 lb 1.6 oz)  12/11/22 : (!) 158.8 kg (350 lb)  12/10/22 : (!) 158.8 kg (350  lb)                Most Recent BP Reading(s)  01/17/24 : 139/84  11/29/23 : (!) 143/81  10/25/23 : 134/80  06/02/23 : (!) 157/100  02/01/23 : 117/79      Physical Exam      I personally reviewed relevant labs, imaging, and studies in Epic[facility].       ASSESSMENT AND PLAN  Cheryl Zimmerman is a 53 year old female who presents with ***     (Associate diagnoses for visit then pull in .DIAG to document A/P or .PROBDIAG if problem-based charting)    Follow up in approximately ***    {Document time spent to support E&M coding:12984}  ____________    Discussed with Attending Dr. Chrys Cranker, MD, 02/21/2024    Resident Physician     ***Update HM, refresh all

## 2024-03-01 ENCOUNTER — Other Ambulatory Visit (HOSPITAL_BASED_OUTPATIENT_CLINIC_OR_DEPARTMENT_OTHER): Payer: Self-pay

## 2024-03-01 NOTE — Telephone Encounter (Signed)
 PER Pharmacy,NAME@ is a 52 year old female has requested a refill of suboxone       Last prescribed - start date: 02/06/24 end date: 03/07/24     Last OFFICE Visit: 01/17/24 with dwyer,i     Last TELE Visit: Recent Visits  Date Type Provider Dept   12/04/23 Rea Sunshine, MD [facility]   10/16/23 Elijio Guadeloupe, MD [facility]   05/23/23 Televisit Mertie Abt, APRN [facility]   Showing recent visits within past 365 days with a meds authorizing provider and meeting all other requirements  Future Appointments  No visits were found meeting these conditions.  Showing future appointments within next 0 days with a meds authorizing provider and meeting all other requirements       Last Physical Exam: Not on file.        There are no preventive care reminders to display for this patient.     Other Med Adult:  Most Recent BP Reading(s)  01/17/24 : 139/84        Cholesterol (mg/dL)   Date Value   60/45/4098 263 (H)     LOW DENSITY LIPOPROTEIN DIRECT (mg/dL)   Date Value   11/91/4782 192 (H)     HIGH DENSITY LIPOPROTEIN (mg/dL)   Date Value   95/62/1308 40     TRIGLYCERIDES (mg/dL)   Date Value   65/78/4696 301 (H)         THYROID  SCREEN TSH REFLEX FT4 (uIU/mL)   Date Value   05/31/2023 2.110         TSH (THYROID  STIM HORMONE) (uIU/mL)   Date Value   12/10/2022 1.190       HEMOGLOBIN A1C (%)   Date Value   07/15/2022 6.5 (H)       No results found for: POCA1C      INR (no units)   Date Value   06/02/2011 1.0 (L)   06/02/2011 < 1.0 (*L)     INR CARE EVERYWHERE (no units)   Date Value   10/16/2021 1.0   09/23/2021 0.9   01/06/2021 1.1   11/11/2020 1.0   07/05/2020 0.9   07/04/2020 0.8 (L)   03/13/2020 1.0       SODIUM (mmol/L)   Date Value   05/31/2023 138       POTASSIUM (mmol/L)   Date Value   05/31/2023 4.0           CREATININE (mg/dL)   Date Value   29/52/8413 0.8        Documented patient preferred pharmacies:    [id number], [place] - [address]  Phone: [phone] Fax:  [phone]

## 2024-03-07 ENCOUNTER — Encounter (HOSPITAL_BASED_OUTPATIENT_CLINIC_OR_DEPARTMENT_OTHER): Payer: Self-pay

## 2024-03-07 NOTE — Prior Authorization (Signed)
 Member ID: [id number]  BIN: [id number]  PCN: [id number]  Group: [id number]  Prior Authorization for: buprenorphine -naloxone  (SUBOXONE ) 8-2 MG sublingual film

## 2024-03-12 NOTE — Prior Authorization (Addendum)
 Pa on file, pharmacy needs to run for brand name, but they need a prescription for no substitution

## 2024-03-18 ENCOUNTER — Telehealth (HOSPITAL_BASED_OUTPATIENT_CLINIC_OR_DEPARTMENT_OTHER): Payer: Self-pay

## 2024-03-18 NOTE — Telephone Encounter (Signed)
 Danice No, MD  P [facility]  Hi--trying to close all all of dwyers charts today--can you retroactively arrive this patient?  Thanks!  Amy          Previous Messages       ----- Message -----  From: Hildred Real, MD  Sent: 03/15/2024   5:50 PM EDT  To: No Danice, MD; [facility]    Hi, could this visit please be changed so that pt is checked in so that chart can be closed? Thank you!!        Patient arrived per provider request.       Eric Adrien Cliche, 03/18/2024

## 2024-03-19 ENCOUNTER — Telehealth (HOSPITAL_BASED_OUTPATIENT_CLINIC_OR_DEPARTMENT_OTHER): Payer: Self-pay

## 2024-03-19 NOTE — Telephone Encounter (Signed)
 Message  Received: 4 days ago  Hildred Real, MD  [facility]; [place], [place], MD  Hi, could this visit please be changed so that pt is checked in so that chart can be closed? Thank you!!      Appointment marked as arrived per provider request.     Verneita Buss, 03/19/2024

## 2024-03-22 ENCOUNTER — Encounter (HOSPITAL_BASED_OUTPATIENT_CLINIC_OR_DEPARTMENT_OTHER): Payer: Self-pay

## 2024-04-04 ENCOUNTER — Other Ambulatory Visit: Payer: Self-pay

## 2024-04-12 ENCOUNTER — Ambulatory Visit: Admitting: Family Medicine

## 2024-04-16 ENCOUNTER — Ambulatory Visit (HOSPITAL_BASED_OUTPATIENT_CLINIC_OR_DEPARTMENT_OTHER): Admitting: Student in an Organized Health Care Education/Training Program

## 2024-04-16 ENCOUNTER — Other Ambulatory Visit: Payer: Self-pay

## 2024-04-19 ENCOUNTER — Encounter (HOSPITAL_BASED_OUTPATIENT_CLINIC_OR_DEPARTMENT_OTHER): Payer: Self-pay

## 2024-04-21 ENCOUNTER — Other Ambulatory Visit (HOSPITAL_BASED_OUTPATIENT_CLINIC_OR_DEPARTMENT_OTHER): Payer: Self-pay

## 2024-04-21 NOTE — Telephone Encounter (Signed)
 PER who is calling: Pharmacy, Cheryl Zimmerman is a 53 year old female has requested a refill of       lioresal .             Last Office Visit  : 01/17/2024 Hildred Real, MD      Last Tele Visit:  02/21/2024      Last Physical Exam:  Not on file.    There are no preventive care reminders to display for this patient.    Other Med Adult:  Most Recent BP Reading(s)  01/17/24 : 139/84        Cholesterol (mg/dL)   Date Value   92/76/7976 263 (H)     LOW DENSITY LIPOPROTEIN DIRECT (mg/dL)   Date Value   92/76/7976 192 (H)     HIGH DENSITY LIPOPROTEIN (mg/dL)   Date Value   92/76/7976 40     TRIGLYCERIDES (mg/dL)   Date Value   92/76/7976 301 (H)         THYROID  SCREEN TSH REFLEX FT4 (uIU/mL)   Date Value   05/31/2023 2.110         TSH (THYROID  STIM HORMONE) (uIU/mL)   Date Value   12/10/2022 1.190       HEMOGLOBIN A1C (%)   Date Value   07/15/2022 6.5 (H)       No results found for: POCA1C      INR (no units)   Date Value   06/02/2011 1.0 (L)   06/02/2011 < 1.0 (*L)     INR CARE EVERYWHERE (no units)   Date Value   10/16/2021 1.0   09/23/2021 0.9   01/06/2021 1.1   11/11/2020 1.0   07/05/2020 0.9   07/04/2020 0.8 (L)   03/13/2020 1.0       SODIUM (mmol/L)   Date Value   05/31/2023 138       POTASSIUM (mmol/L)   Date Value   05/31/2023 4.0           CREATININE (mg/dL)   Date Value   90/88/7975 0.8       Documented patient preferred pharmacies:    [id number], [place] - [address]  Phone: [phone] Fax: [phone]

## 2024-04-22 ENCOUNTER — Other Ambulatory Visit (HOSPITAL_BASED_OUTPATIENT_CLINIC_OR_DEPARTMENT_OTHER): Payer: Self-pay

## 2024-04-22 NOTE — Telephone Encounter (Signed)
 PER who is calling: Pharmacy, Cheryl Zimmerman is a 53 year old female has requested a refill of       bumex .            Last Office Visit  : 01/17/2024 Hildred Real, MD      Last Tele Visit:  02/21/2024      Last Physical Exam:  Not on file.    There are no preventive care reminders to display for this patient.    Other Med Adult:  Most Recent BP Reading(s)  01/17/24 : 139/84        Cholesterol (mg/dL)   Date Value   92/76/7976 263 (H)     LOW DENSITY LIPOPROTEIN DIRECT (mg/dL)   Date Value   92/76/7976 192 (H)     HIGH DENSITY LIPOPROTEIN (mg/dL)   Date Value   92/76/7976 40     TRIGLYCERIDES (mg/dL)   Date Value   92/76/7976 301 (H)         THYROID  SCREEN TSH REFLEX FT4 (uIU/mL)   Date Value   05/31/2023 2.110         TSH (THYROID  STIM HORMONE) (uIU/mL)   Date Value   12/10/2022 1.190       HEMOGLOBIN A1C (%)   Date Value   07/15/2022 6.5 (H)       No results found for: POCA1C      INR (no units)   Date Value   06/02/2011 1.0 (L)   06/02/2011 < 1.0 (*L)     INR CARE EVERYWHERE (no units)   Date Value   10/16/2021 1.0   09/23/2021 0.9   01/06/2021 1.1   11/11/2020 1.0   07/05/2020 0.9   07/04/2020 0.8 (L)   03/13/2020 1.0       SODIUM (mmol/L)   Date Value   05/31/2023 138       POTASSIUM (mmol/L)   Date Value   05/31/2023 4.0           CREATININE (mg/dL)   Date Value   90/88/7975 0.8       Documented patient preferred pharmacies:    [id number], [place] - [address]  Phone: [phone] Fax: [phone]

## 2024-04-23 ENCOUNTER — Ambulatory Visit: Admitting: Physician Assistant

## 2024-05-02 ENCOUNTER — Ambulatory Visit: Admitting: Physician Assistant

## 2024-05-09 ENCOUNTER — Telehealth (HOSPITAL_BASED_OUTPATIENT_CLINIC_OR_DEPARTMENT_OTHER): Payer: Self-pay | Admitting: Physician Assistant

## 2024-05-09 ENCOUNTER — Ambulatory Visit: Attending: Physician Assistant | Admitting: Physician Assistant

## 2024-05-09 DIAGNOSIS — G894 Chronic pain syndrome: Secondary | ICD-10-CM | POA: Diagnosis present

## 2024-05-09 DIAGNOSIS — F251 Schizoaffective disorder, depressive type: Secondary | ICD-10-CM | POA: Insufficient documentation

## 2024-05-09 DIAGNOSIS — E559 Vitamin D deficiency, unspecified: Secondary | ICD-10-CM | POA: Insufficient documentation

## 2024-05-09 DIAGNOSIS — I5030 Unspecified diastolic (congestive) heart failure: Secondary | ICD-10-CM | POA: Diagnosis present

## 2024-05-09 DIAGNOSIS — H1045 Other chronic allergic conjunctivitis: Secondary | ICD-10-CM | POA: Diagnosis present

## 2024-05-09 DIAGNOSIS — F1120 Opioid dependence, uncomplicated: Secondary | ICD-10-CM | POA: Insufficient documentation

## 2024-05-09 DIAGNOSIS — G40909 Epilepsy, unspecified, not intractable, without status epilepticus: Secondary | ICD-10-CM | POA: Diagnosis present

## 2024-05-09 DIAGNOSIS — Z993 Dependence on wheelchair: Secondary | ICD-10-CM | POA: Diagnosis present

## 2024-05-09 DIAGNOSIS — R946 Abnormal results of thyroid function studies: Secondary | ICD-10-CM | POA: Diagnosis present

## 2024-05-09 DIAGNOSIS — F1320 Sedative, hypnotic or anxiolytic dependence, uncomplicated: Secondary | ICD-10-CM | POA: Diagnosis present

## 2024-05-09 MED ORDER — CETIRIZINE HCL 10 MG PO TABS
10.0000 mg | ORAL_TABLET | Freq: Every day | ORAL | 3 refills | Status: DC
Start: 2024-05-09 — End: 2024-05-28

## 2024-05-09 MED ORDER — BACLOFEN 10 MG PO TABS
10.0000 mg | ORAL_TABLET | Freq: Three times a day (TID) | ORAL | 2 refills | Status: DC
Start: 2024-05-09 — End: 2024-07-29

## 2024-05-09 MED ORDER — BUPRENORPHINE HCL-NALOXONE HCL 8-2 MG SL FILM: ORAL_FILM | SUBLINGUAL | 0 refills | Status: DC

## 2024-05-09 MED ORDER — GABAPENTIN 800 MG PO TABS
800.0000 mg | ORAL_TABLET | Freq: Three times a day (TID) | ORAL | 2 refills | Status: DC
Start: 2024-05-09 — End: 2024-07-24

## 2024-05-09 MED ORDER — VITAMIN D 25 MCG (1000 UT) PO TABS
1000.0000 [IU] | ORAL_TABLET | Freq: Every day | ORAL | 3 refills | Status: AC
Start: 2024-05-09 — End: 2025-05-09

## 2024-05-09 MED ORDER — BUMETANIDE 1 MG PO TABS: 1.0000 mg | ORAL_TABLET | Freq: Every day | ORAL | 0 refills | Status: DC

## 2024-05-09 NOTE — Progress Notes (Signed)
 S:    Televisit encounter    This 53 year old female is scheduled for follow up. Pt's last pcp graduated from residency and is no longer at [facility]. Plan was for pt to get care at a clinic closer to her home. She says this did not work out and she wants to keep care at [facility]. Pt's pcp will be starting at [facility] in the fall. Pt does not want to go there; [facility] is easier for her.    Current issues:    Chronic pain  In a lot of pain  In a wheelchair for 5 yeasrs  Has a lot of pain in back and legs  Last visit she was on suboxone  32 mg daily (2 films BID). She says she has appx 12 films left. Has been getting them from the ED. Often goes to [facility] or [facility]. Nothing in care everywhere.  She is also on gabapentin  600 tid and baclofen  10 bid  She asks to go to 800 gabapentin . Has been on this in the past. Unclear why it went down to 600.  Also asking to increase baclofen  dose  She would like an electric wheelchair. This has been ordered, but she says the order has expired. She says order needs to go to: fax--[phone], attn amy obrien  Also would like home PT. Would be very difficult for her to go to in person PT.  Leaves home only when necessary, mostly home bound  She says Insurance will not cover robaxin  ot tinazadine    Mood  Recently admitted  Has psych outside [facility]--[facility]  Plan is to [facility]  She says mood is OK right now  Denies SI/HI or avh  Had etoh relapse in december. Sober now.  Denies cravings  Denies drug use  Also has vna and hha. ([facility]?)     Gaining a lot of weight  Wants weight loss pill  Does not want to do an injection  A1c 5.8 12/25/23. Lipids normal.    Eyes very watery and itchy  Taking claritin  but it is not helping  Asks for benadryl     17 years ago when detoxing from atoh had seizures  Self d/c'd keppra  over one year ago  Says she has not had a seizure and feels fine  Does not have a neurologist    Does not know if vna can do lab orders  Could  come in next month for blood      Most Recent Weight Reading(s)  05/29/23 : (!) 141.1 kg (311 lb 1.1 oz)  02/01/23 : (!) 146 kg (321 lb 14 oz)  12/31/22 : (!) 155.2 kg (342 lb 1.6 oz)  12/11/22 : (!) 158.8 kg (350 lb)  12/10/22 : (!) 158.8 kg (350 lb)      Hx of HfPEF  Feeling very swollen in her legs  Taking bumex  every am  Not sure about aldactone   Denies cp, sob, palps  Does not have a cardiologist  No recent echo on file      Called pharmacy for med list:    Mvi 8/19  Nicotine  gum as needed #100 8/19  Prazosin  2 mg 1 cap at bedtime #14 8/19  Seroquel  100 #7 8/19  Klonopin  1 mg bid prn 314 8/19  Flonase  2 sprays 8/12  Symbicort  160, 2 bid  Fluoxetine  40 1 daily 8/10 #14--no recent 10 filled,   Zyprexa  2 2 tabs daily, 8/5 #30  Bumex  1 mg 1 tab daily,  8/4 #14   Baclofen  10 bid #8/4, 2 weeks  Remeron  15, nightly # 8/2 30  Sucralfate  1 g qid  #90 8/1  Melatonin 5, 10 qhs --July 30, one week  Gabapentin  600 tid, 7/18 1 mo  Buspar  10 tid, 7/16 week  Topamax 50 qhs 7/16, 7 days  Aldactone  100 d, 617 #90  Lipitor 80 on hold--6/17 #90  Synthroid  25 mcg  omeprazole  6/17  Keppra  # 5/29  Suboxone  6/17, film, 8-2, 2 bid welsh, 56. Insurance only covers 3 films       Review of Systems   Constitutional: Negative.    HENT: Negative.    Eyes: Negative.    Respiratory: Negative.    Cardiovascular: Negative.    Gastrointestinal: Negative.    GU: Negative.  Musculoskeletal: Negative.    Skin: Negative.    Neurological: Negative.    Endo/Heme/Allergies: Negative.    Psychiatric/Behavioral: Negative.      All other systems reviewed and are negative.    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     (HFpEF) heart failure with preserved ejection fraction ([name])     MDD (major depressive disorder), recurrent, severe, with psychosis ([name])     Requires continuous at home supplemental oxygen      Spinal stenosis of lumbar 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 claudication     Fall in home     Gait instability     Hypokalemia     Lung nodule     Stable angina ([name])     Chest pain, atypical     Obesity hypoventilation syndrome ([name])     Osteoarthritis of spine with radiculopathy, lumbar region     Chronic bilateral low back pain with bilateral sciatica     Hypoxia     Alcohol  withdrawal syndrome, uncomplicated ([name])     Cocaine  use     Homicidal ideation     Somnolence     Obstructive sleep apnea syndrome     Acute metabolic encephalopathy     B12 deficiency     Vitamin D  deficiency    baclofen  (LIORESAL ) 10 MG tablet, TAKE 1 TABLET BY MOUTH TWICE A DAY AS NEEDED, Disp: 60 tablet, Rfl: 0  bumetanide  (BUMEX ) 1 MG tablet, Take 1 tablet by mouth daily, Disp: 30 tablet, Rfl: 2  nicotine  (NICODERM CQ ) 21 MG/24HR, APPLY 1 PATCH ONTO THE SKIN IN THE MORNING, Disp: 42 patch, Rfl: 2  clonazePAM  (KLONOPIN ) 1 MG tablet, Take 1 tablet by mouth nightly as needed for Anxiety, Disp: 30 tablet, Rfl: 0  prazosin  (MINIPRESS ) 1 MG capsule, TAKE 1 CAPSULE BY MOUTH EVERY DAY AT NIGHT, Disp: 30 capsule, Rfl: 0  mirtazapine  (REMERON ) 15 MG tablet, Take 1 tablet by mouth nightly, Disp: 30 tablet, Rfl: 2  gabapentin  (NEURONTIN ) 600 MG tablet, Take 1 tablet by mouth 3 (three) times daily, Disp: 90 tablet, Rfl: 1  gabapentin  (NEURONTIN ) 800 MG tablet, Take 1 tablet by mouth 3 (three) times daily, Disp: 90 tablet, Rfl: 0  FLUoxetine  (PROZAC ) 10 MG capsule, Take 1 capsule by mouth daily, Disp: 15 capsule, Rfl: 1  loratadine  (CLARITIN ) 10 MG tablet, Take 1 tablet by mouth daily, Disp: 30 tablet, Rfl: 11  atorvastatin  (LIPITOR) 80 MG  tablet, Take 1 tablet by mouth daily, Disp: 90 tablet, Rfl: 3  budesonide -formoterol  (SYMBICORT ,BREYNA ) 160-4.5 MCG/ACT inhaler, Inhale 2 puffs into the lungs in the morning and 2 puffs before bedtime., Disp: 6 g, Rfl: 2  cholecalciferol  (VITAMIN D3) 25 MCG (1000 UT) tablet, Take 1 tablet by mouth daily, Disp: 30 tablet, Rfl: 1  levETIRAcetam  (KEPPRA ) 750 MG tablet, Take 1 tablet by mouth in the morning and 1 tablet before bedtime., Disp: 180 tablet, Rfl: 3  levothyroxine  (SYNTHROID ) 25 MCG tablet, Take 1 tablet by mouth every morning before breakfast, Disp: 90 tablet, Rfl: 3  Multiple Vitamin (MULTIVITAMIN) tablet, Take 1 tablet by mouth daily, Disp: 90 tablet, Rfl: 3  nicotine  polacrilex (NICORETTE ) 4 MG gum, Take 1 each by mouth as needed for Craving (Nicotine ), Disp: 100 tablet, Rfl: 11  OLANZapine  (ZYPREXA ) 5 MG tablet, Take 2 tablets by mouth daily, Disp: 60 tablet, Rfl: 2  omeprazole  (PRILOSEC) 40 MG capsule, Take 1 capsule by mouth daily, Disp: 90 capsule, Rfl: 3  OXcarbazepine  (TRILEPTAL ) 150 MG tablet, Take 1 tablet by mouth in the morning and 1 tablet before bedtime., Disp: 60 tablet, Rfl: 1  polyethylene glycol (GLYCOLAX /MIRALAX ) 17 g packet, Take 1 packet by mouth daily, Disp: 90 packet, Rfl: 3  spironolactone  (ALDACTONE ) 100 MG tablet, Take 1 tablet by mouth daily, Disp: 90 tablet, Rfl: 3  sucralfate  (CARAFATE ) 1 GM tablet, Take 1 tablet by mouth in the morning and 1 tablet at noon and 1 tablet in the evening and 1 tablet before bedtime., Disp: 360 tablet, Rfl: 3  FLUoxetine  (PROZAC ) 40 MG capsule, Take 1 capsule by mouth daily, Disp: 30 capsule, Rfl: 2  FLUoxetine  (PROZAC ) 10 MG tablet, Take 1 tablet by mouth daily, Disp: 30 tablet, Rfl: 2  mirtazapine  (REMERON ) 7.5 MG tablet, Take 1 tablet by mouth nightly, Disp: 30 tablet, Rfl: 0  OTHER MEDICATION, Electric wheelchair  For use as directed.    DX: Neuropathy  ICD10:G60    Please send to [facility] Fax # [phone], Disp: 1 each, Rfl: 0    No  current facility-administered medications for this visit.    Review of Patient's Allergies indicates:   Bee venom               Anaphylaxis   Metolazone               Other (See Comments)    Comment:Hypokalemia   Trazodone                Hives   Methylprednisolone      Dizziness, Drowsiness    Comment:Also believes syncope. Seems to tolerate             inhalational and epidural steroid.   Nutritional supplem*       Tegretol [carbamaze*    Hives   Hydroxyzine              Nausea Only        O:    There were no vitals taken for this visit.  Speaking clear and full sentences   A&O x 3  Facial features normal    A/P    1. Chronic  pain disorder  2. 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 Last pcp note states she is on 32 mg of suboxone  though interestingly this is not in pdmp. Pt states she has been getting them from recent admissions so perhaps this is why. Spoke with pharmacy who sayd insurance only covers 3 films per day, so PA will be required. Will give one mo to gie benefit of doubt and will arrange for in person visit with new pcp. I can see she has been on gabapentin  800 in the past, so will resume this. Will also increase baclofen  to tid. Cautioned against sedation as she is also on benzo's rx by psych.  Plan:  - buprenorphine -naloxone  (SUBOXONE ) 8-2 MG sublingual film; Place two films underneath the tongue twice daily  Dispense: 120 Film; Refill: 0  - gabapentin  (NEURONTIN ) 800 MG tablet; Take 1 tablet by mouth 3 (three) times daily  Dispense: 90 tablet; Refill: 2  - baclofen  (LIORESAL ) 10 MG tablet; Take 1 tablet by mouth 3 (three) times daily  Dispense: 90 tablet; Refill: 2    3. Wheelchair dependent  Comment: message sent to central refill in : motorized scooter as this was already ordered several mo ago.  Plan:  - OTHER MEDICATION; Wheelchair: Motorized  For use as directed.    DX: Wheelchair  dependent  PRI89:S00.6  Dispense: 1 each; Refill: 0    4. Schizoaffective disorder, depressive type ([name])  5. Benzodiazepine dependence ([name])  Comment: recurrent admissions. Managed by psych outside of [facility].updated list per fills at [facility], but will have rn call [facility] to confirm. Pt seems stable at the moment.  Plan:  - prazosin  (MINIPRESS ) 2 MG capsule; Take 1 capsule by mouth nightly  - mirtazapine  (REMERON ) 15 MG tablet; Take 1 tablet by mouth nightly  - FLUoxetine  (PROZAC ) 40 MG capsule; Take 1 capsule by mouth daily  - OLANZapine  (ZYPREXA ) 5 MG tablet; Take 2 tablets by mouth daily  - QUEtiapine  (SEROQUEL ) 100 MG tablet; Take 1 tablet by mouth at bedtime  - Melatonin 5 MG CAPS; Take 10 mg by mouth at bedtime  - topiramate (TOPAMAX) 50 MG tablet; Take 1 tablet by mouth in the morning and 1 tablet before bedtime.    6. Seizure disorder ([name])  Comment: pt self d/c'd keppra  over one year ago. She denies any seizures. Recommend she see neuro to ensure she does not need further management. Stressed the importance of avoiding etoh.  Plan:  - REFERRAL TO NEUROLOGY (INT)    7. Heart failure with preserved ejection fraction, unspecified HF chronicity ([name])  Comment: sounds like legs are edematous though also sounds lke legs are always dependent. Needs in person eval w/ labs. Also needs cards f/u.  Plan:  - REFERRAL TO CARDIOLOGY (INT)  - continue bumex  and aldactone     8. Abnormal thyroid  function test  Comment: due for TSH.   Plan:  - continue with synthroid  25 for now    9. Other chronic allergic conjunctivitis of both eyes  Comment: s/c claritin  and try zyrtec . Continue flonase  for the rhinitis.  Plan:  - cetirizine  (ZYRTEC ) 10 MG tablet; Take 1 tablet by mouth daily  Dispense: 90 tablet; Refill: 3  - fluticasone  (FLONASE ) 50 MCG/ACT nasal spray; 2 sprays by Each Nostril route daily    10. Vitamin D  deficiency  Comment: hx of  Plan:  - cholecalciferol  (VITAMIN D3) 25 MCG (1000 UT) tablet; Take 1  tablet by mouth daily   Dispense: 90 tablet; Refill: 3      I spent a total of 50+ minutes on this visit on the date of service (total time includes all activities performed on the date of service)      Clement Quivers, PA-C

## 2024-05-09 NOTE — Telephone Encounter (Addendum)
 Dear RN    pt wants electric wheelchair and home PT. Can you call [facility] and see if they can arrange for home PT? I will work on the Passenger transport manager.    Can you also compare our med lists? I called pharmacy and got recent fills, but I am not 100% sure what psych meds she actually has at home.    Thank you!  Clement Quivers, PA-C

## 2024-05-10 ENCOUNTER — Telehealth (HOSPITAL_BASED_OUTPATIENT_CLINIC_OR_DEPARTMENT_OTHER): Payer: Self-pay

## 2024-05-10 MED ORDER — OTHER MEDICATION
0 refills | Status: AC
Start: 2024-05-10 — End: 2025-05-10
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
## 2024-05-10 NOTE — Telephone Encounter (Signed)
 Lynch, Annmarie K., PA-C to Me  AL      05/10/24  2:11 PM  Actually, thinking about it, she has VNA. I think [facility]?  Lynch, Annmarie K., PA-C to Me  AL      05/10/24  2:00 PM  OK,  will place the vna order.    In re: electric wheelchair, I sent message to central refill yesterday as a lot of work was done on this a few mo ago. Stay tuned!    Clement Quivers, PA-C    Reviewed [facility] website, unable to tell if they offer in home PT services.   Called [facility] [phone] to clarify.  Transferred to intake however unable to reach staff.  VML requesting return call to clinic.     Harlene JAYSON Collet, RN, 05/10/2024

## 2024-05-10 NOTE — Telephone Encounter (Signed)
 Cheryl Zimmerman [id number], 53 year old, female   Telephone Information:   Home Phone [phone]   Work Phone Not on file.   Mobile [phone]       Calls today: Patient calling in today to speak with a nurse. She wanted to inform the RN that she scheduled her appointment with a neurologist. She was informed by provider Clement Quivers, PA-C  that she was going to have a new script of an electric wheelchair sent for her to [facility]. Patient provided fax number [phone] to [facility] staff Amy Mitcheal. She also requested for home PT.     Person calling on behalf of patient: who is calling: Patient (self)    CALL BACK NUMBER: [phone]  Best time to call back: Anytime   Cell phone:   Other phone:    Patient's language of care: English    Patient does not need an interpreter.    Patient's PCP: Manuelita Rush, MD    Primary Care Home Site:  [facility]

## 2024-05-10 NOTE — Telephone Encounter (Signed)
 [facility]  Call made to Cheryl Zimmerman to schedule an appointment Pls schedule IN PERSON appt with new pcp only, 4-6 weeks . No interpreter needed.   WHO ARE YOU CALLING: Patient (self) answered at [phone] ; appointment booked.  I have completed the following communication and reminder steps: Patient notified by telephone  [name], 05/10/2024    On behalf of PCP: [name], MD

## 2024-05-10 NOTE — Telephone Encounter (Signed)
 Patient requesting a electric wheelchair .  Please review     Thank you

## 2024-05-10 NOTE — Telephone Encounter (Signed)
-----   Message from Clement Quivers sent at 05/10/2024  3:04 PM EDT -----  Pls schedule IN PERSON appt with new pcp only, 4-6 weeks.    Thanks!  Clement Quivers, PA-C

## 2024-05-10 NOTE — Telephone Encounter (Signed)
 Patient requesting a referral for Physical therapy .  Please review     Thank you

## 2024-05-13 ENCOUNTER — Telehealth (HOSPITAL_BASED_OUTPATIENT_CLINIC_OR_DEPARTMENT_OTHER): Payer: Self-pay | Admitting: Internal Medicine

## 2024-05-13 NOTE — Telephone Encounter (Signed)
 Pt requesting a call back in regards to her electric wheel chair.    Thank you

## 2024-05-14 ENCOUNTER — Ambulatory Visit: Attending: Physician Assistant | Admitting: Physician Assistant

## 2024-05-14 ENCOUNTER — Other Ambulatory Visit: Payer: Self-pay

## 2024-05-14 ENCOUNTER — Encounter (HOSPITAL_BASED_OUTPATIENT_CLINIC_OR_DEPARTMENT_OTHER): Payer: Self-pay | Admitting: Physician Assistant

## 2024-05-14 ENCOUNTER — Ambulatory Visit (HOSPITAL_BASED_OUTPATIENT_CLINIC_OR_DEPARTMENT_OTHER): Admitting: Lab

## 2024-05-14 VITALS — BP 140/82 | HR 74 | Temp 97.2°F

## 2024-05-14 DIAGNOSIS — F1320 Sedative, hypnotic or anxiolytic dependence, uncomplicated: Secondary | ICD-10-CM | POA: Diagnosis present

## 2024-05-14 DIAGNOSIS — R946 Abnormal results of thyroid function studies: Secondary | ICD-10-CM | POA: Insufficient documentation

## 2024-05-14 DIAGNOSIS — I5030 Unspecified diastolic (congestive) heart failure: Secondary | ICD-10-CM | POA: Insufficient documentation

## 2024-05-14 DIAGNOSIS — F251 Schizoaffective disorder, depressive type: Secondary | ICD-10-CM | POA: Insufficient documentation

## 2024-05-14 DIAGNOSIS — F1120 Opioid dependence, uncomplicated: Secondary | ICD-10-CM | POA: Insufficient documentation

## 2024-05-14 DIAGNOSIS — R748 Abnormal levels of other serum enzymes: Secondary | ICD-10-CM | POA: Diagnosis present

## 2024-05-14 DIAGNOSIS — Z993 Dependence on wheelchair: Secondary | ICD-10-CM | POA: Insufficient documentation

## 2024-05-14 DIAGNOSIS — G40909 Epilepsy, unspecified, not intractable, without status epilepticus: Secondary | ICD-10-CM | POA: Insufficient documentation

## 2024-05-14 DIAGNOSIS — Z1159 Encounter for screening for other viral diseases: Secondary | ICD-10-CM | POA: Insufficient documentation

## 2024-05-14 DIAGNOSIS — G894 Chronic pain syndrome: Secondary | ICD-10-CM | POA: Diagnosis present

## 2024-05-14 LAB — CBC WITH PLATELET
ABSOLUTE NRBC COUNT: 0 TH/uL (ref 0.0–0.0)
HEMATOCRIT: 46 % — ABNORMAL HIGH (ref 34.1–44.9)
HEMOGLOBIN: 14.4 g/dL (ref 11.2–15.7)
MEAN CORP HGB CONC: 31.3 g/dL (ref 31.0–37.0)
MEAN CORPUSCULAR HGB: 30.3 pg (ref 26.0–34.0)
MEAN CORPUSCULAR VOL: 96.6 fl (ref 80.0–100.0)
MEAN PLATELET VOLUME: 12.2 fL (ref 8.7–12.5)
NRBC %: 0 % (ref 0.0–0.0)
PLATELET COUNT: 261 TH/uL (ref 150–400)
RBC DISTRIBUTION WIDTH STD DEV: 45.9 fL (ref 35.1–46.3)
RED BLOOD CELL COUNT: 4.76 M/uL (ref 3.90–5.20)
WHITE BLOOD CELL COUNT: 7.8 TH/uL (ref 4.0–11.0)

## 2024-05-14 MED ORDER — BUSPIRONE HCL 15 MG PO TABS
15.0000 mg | ORAL_TABLET | Freq: Three times a day (TID) | ORAL | 0 refills | Status: DC
Start: 2024-05-14 — End: 2024-05-31

## 2024-05-14 NOTE — Progress Notes (Unsigned)
 S:    This 53 year old female comes to [facility] for f/u. See last note for more details.    In re: electric wheelchair, new order has been placed and I have messaged central refill  Pt insistent script needs to be faxed to [phone], attn amy o'brien. This is the person assessing her for the chair.    In re: vna. Looking for home PT but also help w/ meds  She clarifies that her case manager is trying to get her vna but she does not actually have one  Pt essentially wheelchair bound  Only leaves home when absolutely necessary    Switching psych providers  [facility]  Has all psych meds except for buspar . Pharmacy stated she is on 10s but pt insistent she was increased to 15 during a hospitalization  Spoke with case worker mike healy--tells me pt has intake w/ new psychiatrist 9/8 Darrel healy [phone])  In case everywhere is documentation for buspar  15 on 6/8    Chronic pain  PA for suboxone  needed  Message sent to central refill last week. Will f/u on this.    CHF  Says legs have been swollen  No redness, sob, cp  Taking diuretics as rx  Seeing cardiology 12/3    Seizure d/o  Seeing neuro 12/12  Pt self d/c'd keppra  one year ago  Has not had any seizures since stopping med    Wants to discuss med for weight loss  Unable to exercise d/t mobility issues  Originally not willing to do injection but now more open to it  A1c and lipids normal recently  No hx of pancreatitis or thyroid  cancer    Review of Systems   Constitutional: Negative.    HENT: Negative.    Eyes: Negative.    Respiratory: Negative.    Cardiovascular: see subjective  Gastrointestinal: Negative.    GU: Negative.  Musculoskeletal:see subjective  Skin: Negative.    Neurological: see subjective  Endo/Heme/Allergies: see subjective  Psychiatric/Behavioral: see subjective    All other systems reviewed and are negative.    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     (HFpEF) heart failure with preserved ejection fraction ([name])     MDD (major depressive disorder), recurrent, severe, with psychosis ([name])     Requires continuous at home supplemental oxygen      Spinal stenosis of lumbar 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 claudication     Fall in home     Gait instability     Hypokalemia     Lung nodule     Stable angina ([name])     Chest pain, atypical     Obesity hypoventilation syndrome ([name])     Osteoarthritis of spine with radiculopathy, lumbar region     Chronic bilateral low back pain with bilateral sciatica     Hypoxia  Alcohol  withdrawal syndrome, uncomplicated ([name])     Cocaine  use     Homicidal ideation     Somnolence     Obstructive sleep apnea syndrome     Acute metabolic encephalopathy     B12 deficiency     Vitamin D  deficiency    OTHER MEDICATION, Wheelchair: Motorized  For use as directed.    DX: Wheelchair dependent  PRI89:S00.6, Disp: 1 each, Rfl: 0  cetirizine  (ZYRTEC ) 10 MG tablet, Take 1 tablet by mouth daily, Disp: 90 tablet, Rfl: 3  bumetanide  (BUMEX ) 1 MG tablet, Take 1 tablet by mouth daily, Disp: 90 tablet, Rfl: 0  gabapentin  (NEURONTIN ) 800 MG tablet, Take 1 tablet by mouth 3 (three) times daily, Disp: 90 tablet, Rfl: 2  baclofen  (LIORESAL ) 10 MG tablet, Take 1 tablet by mouth 3 (three) times daily, Disp: 90 tablet, Rfl: 2  buprenorphine -naloxone  (SUBOXONE ) 8-2 MG sublingual film, Place two films underneath the tongue twice daily, Disp: 120 Film, Rfl: 0  prazosin  (MINIPRESS ) 2 MG capsule, Take 1 capsule by mouth nightly, Disp: , Rfl:    mirtazapine  (REMERON ) 15 MG tablet, Take 1 tablet by mouth nightly, Disp: , Rfl:   FLUoxetine  (PROZAC ) 40 MG capsule, Take 1 capsule by mouth daily, Disp: , Rfl:   clonazePAM  (KLONOPIN ) 1 MG tablet, Take 1 tablet by mouth 2 (two) times daily as needed for Anxiety, Disp: , Rfl:   OLANZapine  (ZYPREXA ) 5 MG tablet, Take 2 tablets by mouth daily, Disp: , Rfl:   cholecalciferol  (VITAMIN D3) 25 MCG (1000 UT) tablet, Take 1 tablet by mouth daily, Disp: 90 tablet, Rfl: 3  QUEtiapine  (SEROQUEL ) 100 MG tablet, Take 1 tablet by mouth at bedtime, Disp: , Rfl:   fluticasone  (FLONASE ) 50 MCG/ACT nasal spray, 2 sprays by Each Nostril route daily, Disp: , Rfl:   Melatonin 5 MG CAPS, Take 10 mg by mouth at bedtime, Disp: , Rfl:   topiramate (TOPAMAX) 50 MG tablet, Take 1 tablet by mouth in the morning and 1 tablet before bedtime., Disp: , Rfl:   bumetanide  (BUMEX ) 1 MG tablet, Take 1 tablet by mouth daily, Disp: 30 tablet, Rfl: 2  nicotine  (NICODERM CQ ) 21 MG/24HR, APPLY 1 PATCH ONTO THE SKIN IN THE MORNING, Disp: 42 patch, Rfl: 2  atorvastatin  (LIPITOR) 80 MG tablet, Take 1 tablet by mouth daily, Disp: 90 tablet, Rfl: 3  budesonide -formoterol  (SYMBICORT ,BREYNA ) 160-4.5 MCG/ACT inhaler, Inhale 2 puffs into the lungs in the morning and 2 puffs before bedtime., Disp: 6 g, Rfl: 2  levothyroxine  (SYNTHROID ) 25 MCG tablet, Take 1 tablet by mouth every morning before breakfast, Disp: 90 tablet, Rfl: 3  Multiple Vitamin (MULTIVITAMIN) tablet, Take 1 tablet by mouth daily, Disp: 90 tablet, Rfl: 3  nicotine  polacrilex (NICORETTE ) 4 MG gum, Take 1 each by mouth as needed for Craving (Nicotine ), Disp: 100 tablet, Rfl: 11  omeprazole  (PRILOSEC) 40 MG capsule, Take 1 capsule by mouth daily, Disp: 90 capsule, Rfl: 3  polyethylene glycol (GLYCOLAX /MIRALAX ) 17 g packet, Take 1 packet by mouth daily, Disp: 90 packet, Rfl: 3  spironolactone  (ALDACTONE ) 100 MG tablet, Take 1 tablet by mouth daily, Disp: 90 tablet, Rfl: 3  sucralfate  (CARAFATE ) 1 GM  tablet, Take 1 tablet by mouth in the morning and 1 tablet at noon and 1 tablet in the evening and 1 tablet before bedtime., Disp: 360 tablet, Rfl: 3  OTHER MEDICATION, Electric wheelchair  For use as directed.    DX: Neuropathy  ICD10:G60    Please send to [facility] Fax # [phone], Disp: 1 each, Rfl: 0    No current facility-administered medications for this visit.    Review of Patient's Allergies indicates:   Bee venom               Anaphylaxis   Metolazone               Other (See Comments)    Comment:Hypokalemia   Trazodone                Hives   Methylprednisolone      Dizziness, Drowsiness    Comment:Also believes syncope. Seems to tolerate             inhalational and epidural steroid.   Nutritional supplem*       Tegretol [carbamaze*    Hives   Hydroxyzine              Nausea Only        O:    BP (!) 140/82   Pulse 74   Temp 97.2 F (36.2 C) (Oral)   SpO2 94%     Physical Exam   Constitutional: Appears well-developed and well-nourished. Morbidly obese. Sitting in wheelchair. Uses legs tom move around.  HENT:   Head: Normocephalic and atraumatic.   Right Ear: External ear normal.   Left Ear: External ear normal.   Nose: Nose normal.   Eyes: Conjunctivae and EOM are normal.   Neck: Normal range of motion. Neck supple.   Skin: Skin is warm and dry.   LE: 1+ non pitting edema b/l. No redness, tenderness, warmth.  Psychiatric: Normal mood and affect. Behavior is normal. Judgment and thought content normal. Good eye contact. Dressed and groomed appropriately.      A/P    1. Chronic pain disorder  2. 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 Still w/ a lot of pain. Has not been able to increase suboxone  as PA required for 4 films daily.  Plan:  - REFERRAL TO [facility]URSE SERVICES (EXT)-- for PT  - message sent to central refill in re: PA for suboxone     3. Wheelchair dependent  Comment: faxed script for wheelchair to amy obrien as pt  requested  Plan:  - REFERRAL TO [facility] (EXT)    4. Schizoaffective disorder, depressive type ([name])  5. Benzodiazepine dependence ([name])  Comment: pt insistent she should be on buspar  15 not 10. I can see documentation of this in care everywhere. Pt has intake w/ new psychiatrist 9/8. Will give her enough to last her until that appt. She has all other meds.   Plan:  - REFERRAL TO [facility]SE [facility] (EXT)  - busPIRone  (BUSPAR ) 15 MG tablet; Take 1 tablet by mouth 3 (three) times daily  for 13 days  Dispense: 39 tablet; Refill: 0    6. Seizure disorder ([name])  Comment: no seizures since self d/cing her keppara one year ago  Plan:  - f/u neuro as planned    7. Heart failure with preserved ejection fraction, unspecified HF chronicity ([name])  Comment: apears eucolemic today. Minimal edema which may just be from dependence.  Plan:  - check CBC WITH PLATELET, COMPREHENSIVE METABOLIC PANEL and  NT-PROBNP  - f/u cards as planned  - will likely get utd echo prior    8. Abnormal thyroid  function test  Comment: taking synthroid  as rx  Plan:  - THYROID  SCREEN TSH REFLEX FT4  - REFERRAL TO VISITING NURSE  SERVICES (EXT)    9. Screening for viral disease  Comment: routine hm  Plan:  - HIV ANTIGEN ANTIBODY 5TH GEN  - HEPATITIS C PCR QUAL TO QUANT    11. Morbid obesity ([name])  Comment: staff was unable to get weight d/t mobility issues, but pt is morbidly obese. Pt open to glp1. Weight loss would likely help pain management.  Plan:  - zepbound  rx. SE reviewed. Pt informed may require PA.      I spent a total of 40+ minutes on this visit on the date of service (total time includes all activities performed on the date of service)      Clement Quivers, PA-C

## 2024-05-15 ENCOUNTER — Encounter (HOSPITAL_BASED_OUTPATIENT_CLINIC_OR_DEPARTMENT_OTHER): Payer: Self-pay | Admitting: Internal Medicine

## 2024-05-15 ENCOUNTER — Telehealth (HOSPITAL_BASED_OUTPATIENT_CLINIC_OR_DEPARTMENT_OTHER): Payer: Self-pay | Admitting: Physician Assistant

## 2024-05-15 LAB — COMPREHENSIVE METABOLIC PANEL
ALANINE AMINOTRANSFERASE: 25 U/L (ref 12–45)
ALBUMIN: 4.1 g/dL (ref 3.4–5.2)
ALKALINE PHOSPHATASE: 156 U/L — ABNORMAL HIGH (ref 45–117)
ANION GAP: 16 mmol/L (ref 10–22)
ASPARTATE AMINOTRANSFERASE: 24 U/L (ref 8–34)
BILIRUBIN TOTAL: 0.2 mg/dL (ref 0.2–1.0)
BUN (UREA NITROGEN): 10 mg/dL (ref 7–18)
CALCIUM: 9.5 mg/dL (ref 8.5–10.5)
CARBON DIOXIDE: 23 mmol/L (ref 21–32)
CHLORIDE: 102 mmol/L (ref 98–107)
CREATININE: 0.8 mg/dL (ref 0.4–1.2)
ESTIMATED GLOMERULAR FILT RATE: >60 mL/min (ref >60)
Glucose Random: 100 mg/dL (ref 74–160)
POTASSIUM: 4.6 mmol/L (ref 3.5–5.1)
SODIUM: 141 mmol/L (ref 136–145)
TOTAL PROTEIN: 7.2 g/dL (ref 6.4–8.2)

## 2024-05-15 LAB — HIV ANTIGEN ANTIBODY 5TH GEN: HIVAGAB QUALITATIVE: NONREACTIVE

## 2024-05-15 LAB — HEPATITIS C PCR QUAL TO QUANT: HEPATITIS C ANTIBODY: REACTIVE — AB

## 2024-05-15 LAB — ADD ON CAN'T BE DONE CHEMISTRY

## 2024-05-15 LAB — NT-PROBNP: NT-proBNP: 99 pg/mL (ref 0–125)

## 2024-05-15 LAB — THYROID SCREEN TSH REFLEX FT4: THYROID SCREEN TSH REFLEX FT4: 2.04 u[IU]/mL (ref 0.270–4.200)

## 2024-05-15 MED ORDER — TIRZEPATIDE-WEIGHT MANAGEMENT 2.5 MG/0.5ML SC SOAJ
2.5000 mg | SUBCUTANEOUS | 0 refills | Status: AC
Start: 2024-05-15 — End: 2024-06-14

## 2024-05-15 NOTE — Telephone Encounter (Signed)
-----   Message from Clement Quivers sent at 05/15/2024  9:10 AM EDT -----  Hi all--just following up on this.    The thing that is urgent is the suboxone  PA.    Thanks!  ----- Message -----  From: Lynch, Annmarie K., PA-C  Sent: 05/09/2024   3:36 PM EDT  To: Centralized Refill Pool    Pt is looking for motorized wheelchair. I know work was previously done on this. Pt tells me today she needs new order as last one expired. She says it should go to fax--[phone], attn amy o'brien, [facility].    Let me know if you need anything on my end!    Also, she likely needs PA for suboxone  as insurance only covers 3 films per day (she takes 4).    Thanks!  Clement Quivers, PA-C

## 2024-05-15 NOTE — Telephone Encounter (Signed)
 Pa has been submitted

## 2024-05-16 ENCOUNTER — Encounter (HOSPITAL_BASED_OUTPATIENT_CLINIC_OR_DEPARTMENT_OTHER): Payer: Self-pay | Admitting: Internal Medicine

## 2024-05-22 ENCOUNTER — Other Ambulatory Visit (HOSPITAL_BASED_OUTPATIENT_CLINIC_OR_DEPARTMENT_OTHER): Payer: Self-pay | Admitting: Physician Assistant

## 2024-05-22 DIAGNOSIS — F251 Schizoaffective disorder, depressive type: Secondary | ICD-10-CM

## 2024-05-22 MED ORDER — QUETIAPINE FUMARATE 100 MG PO TABS
100.0000 mg | ORAL_TABLET | Freq: Every evening | ORAL | 0 refills | Status: DC
Start: 2024-05-22 — End: 2024-05-28

## 2024-05-22 NOTE — Telephone Encounter (Signed)
 PER who is calling: Pharmacy, Cheryl Zimmerman is a 53 year old female has requested a refill of       Seroquel  .                Last Office Visit  : 05/14/2024 Lynch, Annmarie K., PA-C      Last Tele Visit:  05/09/2024      Last Physical Exam:  Not on file.    There are no preventive care reminders to display for this patient.    Other Med Adult:  Most Recent BP Reading(s)  05/14/24 : (!) 140/82        Cholesterol (mg/dL)   Date Value   92/76/7976 263 (H)     LOW DENSITY LIPOPROTEIN DIRECT (mg/dL)   Date Value   92/76/7976 192 (H)     HIGH DENSITY LIPOPROTEIN (mg/dL)   Date Value   92/76/7976 40     TRIGLYCERIDES (mg/dL)   Date Value   92/76/7976 301 (H)         THYROID  SCREEN TSH REFLEX FT4 (uIU/mL)   Date Value   05/14/2024 2.040         TSH (THYROID  STIM HORMONE) (uIU/mL)   Date Value   12/10/2022 1.190       HEMOGLOBIN A1C (%)   Date Value   07/15/2022 6.5 (H)     HEMOGLOBIN A1C CARE EVERYWHERE (%)   Date Value   12/25/2023 5.8 (H)       No results found for: POCA1C      INR (no units)   Date Value   06/02/2011 1.0 (L)   06/02/2011 < 1.0 (*L)     INR CARE EVERYWHERE (no units)   Date Value   10/16/2021 1.0   09/23/2021 0.9   01/06/2021 1.1   11/11/2020 1.0   07/05/2020 0.9   07/04/2020 0.8 (L)   03/13/2020 1.0       SODIUM (mmol/L)   Date Value   05/14/2024 141       POTASSIUM (mmol/L)   Date Value   05/14/2024 4.6           CREATININE (mg/dL)   Date Value   91/73/7974 0.8       Documented patient preferred pharmacies:    [id number] [place], [place] - [address]  Phone: [phone] Fax: [phone]

## 2024-05-24 ENCOUNTER — Telehealth (HOSPITAL_BASED_OUTPATIENT_CLINIC_OR_DEPARTMENT_OTHER): Payer: Self-pay

## 2024-05-24 NOTE — Telephone Encounter (Signed)
 VNA referral faxed to [facility] at [phone].  Fax confirmation received.     Harlene JAYSON Collet, RN, 05/24/2024

## 2024-05-27 ENCOUNTER — Telehealth (HOSPITAL_BASED_OUTPATIENT_CLINIC_OR_DEPARTMENT_OTHER): Payer: Self-pay

## 2024-05-27 ENCOUNTER — Telehealth (HOSPITAL_BASED_OUTPATIENT_CLINIC_OR_DEPARTMENT_OTHER): Payer: Self-pay | Admitting: Internal Medicine

## 2024-05-27 NOTE — Telephone Encounter (Signed)
 The sleep study was completed at an OSH  [facility].  Unfortunately I do not have details of the study.  When patient is next in clinic I will obtain permission to request the records.

## 2024-05-27 NOTE — Telephone Encounter (Signed)
 Hello,    The insurance company called requesting information regarding the severity of Cheryl Zimmerman's Obstructive Sleep Apnea for a PA that is being processed for Zepbound .  Please advise.    Thank you,  Trina

## 2024-05-27 NOTE — Telephone Encounter (Signed)
 [facility]     [place]     [phone]

## 2024-05-27 NOTE — Telephone Encounter (Signed)
 Cheryl Zimmerman [id number], 53 year old, female   Telephone Information:   Home Phone [phone]   Work Phone Not on file.   Mobile [phone]       Calls today: VNA/Nursing Home/Other Agency: non urgent call    Name of VNA [facility]   Name of person calling [name]   Reason for call The patients insurance is not contracted   Return phone number [phone]    NOTE: Urgent calls are a warm handoff         Primary Care Home Site:  [facility]

## 2024-05-28 ENCOUNTER — Ambulatory Visit (HOSPITAL_BASED_OUTPATIENT_CLINIC_OR_DEPARTMENT_OTHER): Payer: Self-pay

## 2024-05-28 ENCOUNTER — Other Ambulatory Visit (HOSPITAL_BASED_OUTPATIENT_CLINIC_OR_DEPARTMENT_OTHER): Payer: Self-pay | Admitting: Internal Medicine

## 2024-05-28 ENCOUNTER — Ambulatory Visit: Admitting: Family

## 2024-05-28 ENCOUNTER — Ambulatory Visit: Attending: Family | Admitting: Family

## 2024-05-28 ENCOUNTER — Encounter (HOSPITAL_BASED_OUTPATIENT_CLINIC_OR_DEPARTMENT_OTHER): Payer: Self-pay | Admitting: Physician Assistant

## 2024-05-28 DIAGNOSIS — G8929 Other chronic pain: Secondary | ICD-10-CM | POA: Insufficient documentation

## 2024-05-28 DIAGNOSIS — Z5321 Procedure and treatment not carried out due to patient leaving prior to being seen by health care provider: Secondary | ICD-10-CM

## 2024-05-28 DIAGNOSIS — H1045 Other chronic allergic conjunctivitis: Secondary | ICD-10-CM | POA: Insufficient documentation

## 2024-05-28 DIAGNOSIS — M48061 Spinal stenosis, lumbar 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 claudication: Secondary | ICD-10-CM | POA: Diagnosis present

## 2024-05-28 DIAGNOSIS — M5441 Lumbago with sciatica, right side: Secondary | ICD-10-CM | POA: Diagnosis present

## 2024-05-28 DIAGNOSIS — M5442 Lumbago with sciatica, left side: Secondary | ICD-10-CM | POA: Insufficient documentation

## 2024-05-28 DIAGNOSIS — F251 Schizoaffective disorder, depressive type: Secondary | ICD-10-CM

## 2024-05-28 MED ORDER — QUETIAPINE FUMARATE 100 MG PO TABS
100.0000 mg | ORAL_TABLET | Freq: Every evening | ORAL | 0 refills | Status: DC
Start: 2024-05-28 — End: 2024-07-08

## 2024-05-28 MED ORDER — CETIRIZINE HCL 10 MG PO TABS
10.0000 mg | ORAL_TABLET | Freq: Every day | ORAL | 3 refills | Status: AC
Start: 2024-05-28 — End: 2025-05-28

## 2024-05-28 MED ORDER — ACETAMINOPHEN 500 MG PO TABS
1000.0000 mg | ORAL_TABLET | Freq: Three times a day (TID) | ORAL | 5 refills | Status: AC | PRN
Start: 2024-05-28 — End: 2024-11-24

## 2024-05-28 NOTE — Telephone Encounter (Signed)
 PER who is calling: Patient (self), Cheryl Zimmerman is a 53 year old female has requested a refill of quetiapine  .    Last Office Visit: 05/14/24     Other Med Adult:  Most Recent BP Reading(s)  05/14/24 : MAGNUS 140/82        Cholesterol (mg/dL)   Date Value   92/76/7976 263 (H)     LOW DENSITY LIPOPROTEIN DIRECT (mg/dL)   Date Value   92/76/7976 192 (H)     HIGH DENSITY LIPOPROTEIN (mg/dL)   Date Value   92/76/7976 40     TRIGLYCERIDES (mg/dL)   Date Value   92/76/7976 301 (H)         THYROID  SCREEN TSH REFLEX FT4 (uIU/mL)   Date Value   05/14/2024 2.040         TSH (THYROID  STIM HORMONE) (uIU/mL)   Date Value   12/10/2022 1.190       HEMOGLOBIN A1C (%)   Date Value   07/15/2022 6.5 (H)     HEMOGLOBIN A1C CARE EVERYWHERE (%)   Date Value   12/25/2023 5.8 (H)       No results found for: POCA1C      INR (no units)   Date Value   06/02/2011 1.0 (L)   06/02/2011 < 1.0 (*L)     INR CARE EVERYWHERE (no units)   Date Value   10/16/2021 1.0   09/23/2021 0.9   01/06/2021 1.1   11/11/2020 1.0   07/05/2020 0.9   07/04/2020 0.8 (L)   03/13/2020 1.0       SODIUM (mmol/L)   Date Value   05/14/2024 141       POTASSIUM (mmol/L)   Date Value   05/14/2024 4.6           CREATININE (mg/dL)   Date Value   91/73/7974 0.8       Documented patient preferred pharmacies:    [id number] [place], [place] - [address]  Phone: [phone] Fax: [phone]

## 2024-05-28 NOTE — Progress Notes (Signed)
 Pt not available though online platfome.   Called 2x. Left VM with call back number.   Patient left without being seen.

## 2024-05-28 NOTE — Telephone Encounter (Signed)
 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     (HFpEF) heart failure with preserved ejection fraction ([name])     MDD (major depressive disorder), recurrent, severe, with psychosis ([name])     Requires continuous at home supplemental oxygen      Spinal stenosis of lumbar 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 claudication     Fall in home     Gait instability     Hypokalemia     Lung nodule     Stable angina ([name])     Chest pain, atypical     Obesity hypoventilation syndrome ([name])     Osteoarthritis of spine with radiculopathy, lumbar region     Chronic bilateral low back pain with bilateral sciatica     Hypoxia     Alcohol  withdrawal syndrome, uncomplicated ([name])     Cocaine  use     Homicidal ideation     Somnolence     Obstructive sleep apnea syndrome     Acute metabolic encephalopathy     B12 deficiency     Vitamin D  deficiency      Review of Patient's Allergies indicates:   Bee venom               Anaphylaxis   Metolazone               Other (See Comments)    Comment:Hypokalemia   Trazodone                Hives   Methylprednisolone      Dizziness, Drowsiness    Comment:Also believes syncope. Seems to tolerate             inhalational and epidural steroid.   Nutritional supplem*       Tegretol [carbamaze*    Hives   Hydroxyzine              Nausea Only    Emergency care:     No emergency symptoms      The patient has the following symptoms:     Memory  trouble    Forgetting dates to appointments  Writing things down and forgets where she puts it  Forgetting where she puts things     Feels uneasy due to memory changes    Uncomfortable going outside, requests tele visit   Wants to speak with a provider today    Transferring psych care to [facility]   Needs pcp to bridge some medications.       Home Remedies:  n/a     Advised per nursing triage protocol.      Recommended disposition for patient:  Disposition: Scheduled Televisit Appointment     Aware of [facility], will not go  there because they will send her to [facility], she refuses to go there  Has crisis phone care through [facility].    If patient referred to UC/ED advised that they may require further follow up and testing after the visit with their primary care office.     Instructed patient to call back for any new, worsening, or worrisome symptoms or concerns any time day or night.    Advised per nursing triage protocol.     Telephone Call Outcome:  Single Call Resolution        Reason for Disposition   Nursing judgment    Protocols used: No Protocol Available-A-OH

## 2024-05-28 NOTE — Progress Notes (Signed)
 The patient verbally consented to an audio recording of their visit to assist with the completion of documentation. The patient is aware the recording is not retained after the visit is summarized.      History of Present Illness  Cheryl Zimmerman is a 53 year old female who presents with muscle spasms and back pain seeking medication management.    She experiences significant muscle spasms and back pain, inadequately controlled with her current medication regimen. Baclofen , prescribed at 90 tablets to be taken three times a day as needed, was last refilled on May 09, 2024. She has been taking more than the prescribed amount due to inadequate relief, resulting in running out of medication early. Previous use of methocarbamol  was discontinued due to insurance issues. She is in a wheelchair, contributing to her discomfort and pain, and has difficulty in functioning due to the pain.    Her current medication regimen includes clonazepam , prescribed for anxiety but also used for muscle spasms, taken twice daily as needed. She is also on gabapentin  800 mg three times a day.    For her allergies, she has been using Claritin  and Flonase  without effective relief. She requests a refill of Benadryl , which she uses as needed for itchy and watery eyes, but it has not been filled since November. She has been prescribed Zyrtec , which she has not yet received.         Review of Systems   All other systems reviewed and are negative.    Physical Exam  Constitutional:       Appearance: Normal appearance.   Neurological:      Mental Status: She is alert and oriented to person, place, and time.   Psychiatric:         Behavior: Behavior normal.       A/P:  1. Other chronic allergic conjunctivitis of both eyes  Allergic rhinitis with ineffective relief from Claritin . Benadryl  not refilled due to interaction risks. Zyrtec  recommended for safety with current medications.  - Prescribe Zyrtec  (cetirizine ) as previously prescribed.  -  Advise against using Benadryl  due to potential interactions with current medications.  - cetirizine  (ZYRTEC ) 10 MG tablet; Take 1 tablet by mouth daily  Dispense: 90 tablet; Refill: 3    2. Chronic bilateral low back pain with bilateral sciatica  3. Spinal stenosis of lumbar 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 claudication  Chronic back pain with muscle spasms. Current medications include baclofen , clonazepam , and gabapentin . Baclofen  ineffective; overuse noted. Discussed risks of excessive medication use and emphasized adherence to prescribed dosages.  - Prescribe acetaminophen  1000 mg every 8 hours as needed for pain relief.  - Encourage continuation of current medications as prescribed, including baclofen , clonazepam , and gabapentin .  - Advise starting physical therapy next week as planned.  - acetaminophen  (TYLENOL ) 500 MG tablet; Take 2 tablets by mouth every 8 (eight) hours as needed for Pain  Dispense: 180 tablet; Refill: 5           I spent a total of 25 minutes on this visit on the date of service (total time includes all activities performed on the date of service)    Andra Dunning, NP  05/28/24    Pg: [phone]

## 2024-05-31 ENCOUNTER — Other Ambulatory Visit (HOSPITAL_BASED_OUTPATIENT_CLINIC_OR_DEPARTMENT_OTHER): Payer: Self-pay | Admitting: Internal Medicine

## 2024-05-31 DIAGNOSIS — F251 Schizoaffective disorder, depressive type: Secondary | ICD-10-CM

## 2024-05-31 MED ORDER — BUSPIRONE HCL 15 MG PO TABS
15.0000 mg | ORAL_TABLET | Freq: Three times a day (TID) | ORAL | 0 refills | Status: DC
Start: 2024-05-31 — End: 2024-06-13

## 2024-05-31 NOTE — Telephone Encounter (Signed)
 Patient is being transferred to a psychiatrist at [facility] and would need medication to hold over the patient    Please review    Thank you

## 2024-05-31 NOTE — Telephone Encounter (Signed)
 Hello,  [facility] received a request for motorized wheelchair.      The process for ordering a POV (power operated vehicle)  is to either put in a referral to Physical Therapy who will do the assessment (be sure to include electric wheelchair/scooter assessment in the referral) OR refer patient directly to [employer] [facility] at [phone] to find where their nearest wheelchair clinic is and they will send us  an DWO for appropriate equipment.    Patient will also need an in-person mobility assessment with provider (please use DMEPOV smartphrase when documenting this).   Thank you

## 2024-05-31 NOTE — Telephone Encounter (Signed)
 [facility] referral faxed to [facility]  Fax: [phone].  Raquel Manchester, RN 05/31/24 5:47 PM

## 2024-06-05 ENCOUNTER — Ambulatory Visit: Admitting: Internal Medicine

## 2024-06-05 DIAGNOSIS — Z5321 Procedure and treatment not carried out due to patient leaving prior to being seen by health care provider: Secondary | ICD-10-CM

## 2024-06-05 NOTE — Progress Notes (Signed)
 Called pt x 2. Unable to reach her for her scheduled televisit.  Mailbox was full.

## 2024-06-06 ENCOUNTER — Telehealth (HOSPITAL_BASED_OUTPATIENT_CLINIC_OR_DEPARTMENT_OTHER): Payer: Self-pay

## 2024-06-06 NOTE — Telephone Encounter (Addendum)
 Cheryl Zimmerman [id number], 53 year old, female   Telephone Information:   Home Phone [phone]   Work Phone Not on file.   Mobile [phone]       Calls today: Other: [name], DPT from [facility] the patient yesterday and would to speak to her PCP. Patient is not fit for outpatient PT, needs home PT and OT. They do not have a referral on file.     Person calling on behalf of patient: who is calling: therapist    [name] NUMBER: [phone]  Best time to call back: in clinic until 4:45 pm and tomorrow form 7a-1p    Patient's language of care: English    Patient does not need an interpreter.    Patient's PCP: [name], MD    Primary Care Home Site:  [facility]            Cheryl Zimmerman [id number], 53 year old, female     Calls today: wants at home PT and Home Health Care Aid. Agency: [facility] send to the intake Department: [phone]    Santina to [facility] did a total evaluation and assessment and said she is too weak to do out of home PT adivse her to do PT at home until her body get stronger. [facility] in [place] does both.     Person calling on behalf of patient: who is calling: Patient (self)    CALL BACK NUMBER: [phone]  Best time to call back: Anytime      Patient's language of care: English    Patient does not need an interpreter.    Patient's PCP: [name], MD    Primary Care Home Site:  [facility]

## 2024-06-06 NOTE — Telephone Encounter (Signed)
 Cheryl Zimmerman [id number], 53 year old, female   Telephone Information:   Home Phone [phone]   Work Phone Not on file.   Mobile [phone]       Calls today: Other: [name], DPT from [facility] the patient yesterday and would to speak to her PCP. Patient is not fit for outpatient PT, needs home PT and OT. They do not have a referral on file.     Person calling on behalf of patient: who is calling: therapist    [name] NUMBER: [phone]  Best time to call back: in clinic until 4:45 pm and tomorrow form 7a-1p    Patient's language of care: English    Patient does not need an interpreter.    Patient's PCP: [name], MD    Primary Care Home Site:  [facility]

## 2024-06-07 NOTE — Telephone Encounter (Signed)
 There is an 8/27 script on file for home PT by Clement Quivers.    RN team, please follow up on that referral and fax as needed.    Thank you

## 2024-06-07 NOTE — Telephone Encounter (Addendum)
 Attempted to return call to [place], PT from [facility].   Spoke to [place] from [employer].  Danielle not available at this time.  Beverley will have Edsel return call to clinic on Monday.     RN faxed referral to [facility] at [phone].   Fax confirmation received.     Harlene JAYSON Collet, RN, 06/07/2024

## 2024-06-11 ENCOUNTER — Other Ambulatory Visit (HOSPITAL_BASED_OUTPATIENT_CLINIC_OR_DEPARTMENT_OTHER): Payer: Self-pay | Admitting: Internal Medicine

## 2024-06-11 DIAGNOSIS — F1120 Opioid dependence, uncomplicated: Secondary | ICD-10-CM

## 2024-06-11 NOTE — Telephone Encounter (Addendum)
 Call to [facility] [phone]   Spoke to Erin from intake  Did not receive fax for [facility]NA referral/ PT    RN refaxed to #[phone]    Callback number given to clinic to f/u if received or not    Confirmation received and sent to scans.    Hailley Byers, RN 06/11/24 4:13 PM

## 2024-06-11 NOTE — Telephone Encounter (Signed)
 Patient called in and stated that she is in between providers, and needs a new RX for her Clonazepam .     She said that she had already discussed this with you, please review and submit if appropriate.

## 2024-06-13 ENCOUNTER — Ambulatory Visit: Attending: Physician Assistant | Admitting: Physician Assistant

## 2024-06-13 ENCOUNTER — Other Ambulatory Visit: Payer: Self-pay

## 2024-06-13 DIAGNOSIS — F251 Schizoaffective disorder, depressive type: Secondary | ICD-10-CM | POA: Insufficient documentation

## 2024-06-13 DIAGNOSIS — M48061 Spinal stenosis, lumbar 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 claudication: Secondary | ICD-10-CM | POA: Diagnosis present

## 2024-06-13 DIAGNOSIS — Z993 Dependence on wheelchair: Secondary | ICD-10-CM | POA: Diagnosis present

## 2024-06-13 DIAGNOSIS — M79652 Pain in left thigh: Secondary | ICD-10-CM | POA: Diagnosis present

## 2024-06-13 DIAGNOSIS — L304 Erythema intertrigo: Secondary | ICD-10-CM | POA: Diagnosis present

## 2024-06-13 MED ORDER — PRAZOSIN HCL 2 MG PO CAPS
2.0000 mg | ORAL_CAPSULE | Freq: Every evening | ORAL | 0 refills | Status: AC
Start: 2024-06-13 — End: 2024-07-04

## 2024-06-13 MED ORDER — OLANZAPINE 5 MG PO TABS
10.0000 mg | ORAL_TABLET | Freq: Every day | ORAL | 0 refills | Status: AC
Start: 2024-06-13 — End: 2024-07-04

## 2024-06-13 MED ORDER — TOPIRAMATE 50 MG PO TABS
50.0000 mg | ORAL_TABLET | Freq: Two times a day (BID) | ORAL | 0 refills | Status: AC
Start: 2024-06-13 — End: 2024-07-04

## 2024-06-13 MED ORDER — BUSPIRONE HCL 15 MG PO TABS
15.0000 mg | ORAL_TABLET | Freq: Three times a day (TID) | ORAL | 0 refills | Status: DC
Start: 2024-06-13 — End: 2024-08-06

## 2024-06-13 MED ORDER — CLONAZEPAM 1 MG PO TABS
1.0000 mg | ORAL_TABLET | Freq: Two times a day (BID) | ORAL | 0 refills | Status: DC | PRN
Start: 2024-06-13 — End: 2024-07-08

## 2024-06-13 MED ORDER — FLUOXETINE HCL 40 MG PO CAPS: 40.0000 mg | ORAL_CAPSULE | Freq: Every day | ORAL | 0 refills | Status: DC

## 2024-06-13 MED ORDER — MICONAZOLE NITRATE 2 % EX POWD
CUTANEOUS | 2 refills | Status: DC | PRN
Start: 2024-06-13 — End: 2024-07-29

## 2024-06-13 NOTE — Telephone Encounter (Signed)
 Returned call to pt however unable to reach at this time.   VML informing pt that referral was faxed to [facility].     Harlene JAYSON Collet, RN, 06/13/2024

## 2024-06-13 NOTE — Telephone Encounter (Signed)
 Cheryl Zimmerman [id number], 53 year old, female   Telephone Information:   Home Phone [phone]   Work Phone Not on file.   Mobile [phone]       Calls today: Patient is calling to confirm if the PT order has been faxed to a [facility]NA. Patient states they have been waiting for a while to start physical therapy.     Person calling on behalf of patient: who is calling: Patient (self)    CALL BACK NUMBER: [phone]  Best time to call back: Anytime   Cell phone:   Other phone:    Patient's language of care: English    Patient does not need an interpreter.    Patient's PCP: [name], MD    Primary Care Home Site:  [facility]

## 2024-06-13 NOTE — Progress Notes (Signed)
 S:    Televisit encounter    This 53 year old female is scheduled for f/u.    1) went to [facility] who felt she should do home PT. My nurses are working on this.    2) fungal rash under belly. on the left side on the L side. Trying to keep it clean. Smells. Has had this before. Does not think she needs oral tx. She has had leg issues (see below). This has made it hard to bath herself which she thinks is the trigger.    3) Spasm left upper thigh. Hurts to even roll. Feels swollen. Just the upper part-- Lower leg looks and feels fine. No redness or warmth.  No fevers, chills.    4) Has an appt in 3 weeks with new psych.she says first appt was just an intake. She will run out of meds prior.  Has seroquel  and klonopin .  Thery stoppped remeron . Needs everything else.    5) Weight--tolerating glp 1 well. No SE. Has done two shots.    Review of Systems   Constitutional: Negative.    HENT: Negative.    Eyes: Negative.    Respiratory: Negative.    Cardiovascular: Negative.    Gastrointestinal: Negative.    GU: Negative.  Musculoskeletal:see subjective    Skin:see subjective    Neurological: Negative.    Endo/Heme/Allergies: see subjective    Psychiatric/Behavioral: see subjective      All other systems reviewed and are negative.    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     (HFpEF) heart failure with preserved ejection fraction ([name])     MDD (major depressive disorder), recurrent, severe, with psychosis ([name])     Requires continuous at home  supplemental oxygen      Spinal stenosis of lumbar 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 claudication     Fall in home     Gait instability     Hypokalemia     Lung nodule     Stable angina ([name])     Chest pain, atypical     Obesity hypoventilation syndrome ([name])     Osteoarthritis of spine with radiculopathy, lumbar region     Chronic bilateral low back pain with bilateral sciatica     Hypoxia     Alcohol  withdrawal syndrome, uncomplicated ([name])     Cocaine  use     Homicidal ideation     Somnolence     Obstructive sleep apnea syndrome     Acute metabolic encephalopathy     B12 deficiency     Vitamin D  deficiency    clonazePAM  (KLONOPIN ) 1 MG tablet, Take 1 tablet by mouth 2 (two) times daily as needed for Anxiety, Disp: 60 tablet, Rfl: 0  busPIRone  (BUSPAR ) 15 MG tablet, Take 1 tablet by mouth 3 (three) times daily, Disp: 90 tablet, Rfl: 0  QUEtiapine  (SEROQUEL ) 100 MG tablet,  Take 1 tablet by mouth at bedtime, Disp: 30 tablet, Rfl: 0  acetaminophen  (TYLENOL ) 500 MG tablet, Take 2 tablets by mouth every 8 (eight) hours as needed for Pain, Disp: 180 tablet, Rfl: 5  cetirizine  (ZYRTEC ) 10 MG tablet, Take 1 tablet by mouth daily, Disp: 90 tablet, Rfl: 3  tirzepatide -Weight Management (ZEPBOUND ) 2.5 MG/0.5ML sc auto-injector, Inject 2.5 mg under the skin once a week, Disp: 2 mL, Rfl: 0  OTHER MEDICATION, Wheelchair: Motorized  For use as directed.    DX: Wheelchair dependent  PRI89:S00.6, Disp: 1 each, Rfl: 0  bumetanide  (BUMEX ) 1 MG tablet, Take 1 tablet by mouth daily, Disp: 90 tablet, Rfl: 0  gabapentin  (NEURONTIN ) 800 MG tablet, Take 1 tablet by mouth 3 (three) times daily, Disp: 90 tablet, Rfl: 2  baclofen  (LIORESAL ) 10 MG tablet, Take 1 tablet by mouth 3 (three) times daily, Disp: 90 tablet, Rfl: 2  prazosin  (MINIPRESS ) 2 MG capsule, Take 1 capsule by mouth nightly, Disp: , Rfl:   mirtazapine  (REMERON ) 15 MG tablet, Take 1 tablet by mouth nightly, Disp: , Rfl:   FLUoxetine  (PROZAC ) 40 MG capsule, Take 1 capsule by mouth  daily, Disp: , Rfl:   OLANZapine  (ZYPREXA ) 5 MG tablet, Take 2 tablets by mouth daily, Disp: , Rfl:   cholecalciferol  (VITAMIN D3) 25 MCG (1000 UT) tablet, Take 1 tablet by mouth daily, Disp: 90 tablet, Rfl: 3  fluticasone  (FLONASE ) 50 MCG/ACT nasal spray, 2 sprays by Each Nostril route daily, Disp: , Rfl:   Melatonin 5 MG CAPS, Take 10 mg by mouth at bedtime, Disp: , Rfl:   topiramate  (TOPAMAX ) 50 MG tablet, Take 1 tablet by mouth in the morning and 1 tablet before bedtime., Disp: , Rfl:   bumetanide  (BUMEX ) 1 MG tablet, Take 1 tablet by mouth daily, Disp: 30 tablet, Rfl: 2  atorvastatin  (LIPITOR) 80 MG tablet, Take 1 tablet by mouth daily, Disp: 90 tablet, Rfl: 3  budesonide -formoterol  (SYMBICORT ,BREYNA ) 160-4.5 MCG/ACT inhaler, Inhale 2 puffs into the lungs in the morning and 2 puffs before bedtime., Disp: 6 g, Rfl: 2  levothyroxine  (SYNTHROID ) 25 MCG tablet, Take 1 tablet by mouth every morning before breakfast, Disp: 90 tablet, Rfl: 3  Multiple Vitamin (MULTIVITAMIN) tablet, Take 1 tablet by mouth daily, Disp: 90 tablet, Rfl: 3  nicotine  polacrilex (NICORETTE ) 4 MG gum, Take 1 each by mouth as needed for Craving (Nicotine ), Disp: 100 tablet, Rfl: 11  omeprazole  (PRILOSEC) 40 MG capsule, Take 1 capsule by mouth daily, Disp: 90 capsule, Rfl: 3  polyethylene glycol (GLYCOLAX /MIRALAX ) 17 g packet, Take 1 packet by mouth daily, Disp: 90 packet, Rfl: 3  spironolactone  (ALDACTONE ) 100 MG tablet, Take 1 tablet by mouth daily, Disp: 90 tablet, Rfl: 3  sucralfate  (CARAFATE ) 1 GM tablet, Take 1 tablet by mouth in the morning and 1 tablet at noon and 1 tablet in the evening and 1 tablet before bedtime., Disp: 360 tablet, Rfl: 3  OTHER MEDICATION, Electric wheelchair  For use as directed.    DX: Neuropathy  ICD10:G60    Please send to [facility] Fax # [phone], Disp: 1 each, Rfl: 0    No current facility-administered medications for this visit.    Review of Patient's Allergies indicates:   Bee venom                Anaphylaxis   Metolazone               Other (See Comments)    Comment:Hypokalemia   Trazodone   Hives   Methylprednisolone      Dizziness, Drowsiness    Comment:Also believes syncope. Seems to tolerate             inhalational and epidural steroid.   Nutritional supplem*       Tegretol [carbamaze*    Hives   Hydroxyzine              Nausea Only        O:    There were no vitals taken for this visit.  Speaking clear and full sentences   A&O x 3  Facial features normal      A/P      1. Spinal stenosis of lumbar 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 claudication  2. Wheelchair dependent  Comment: my RN [facility]  Plan:  - will have RN f/u    3. Intertrigo  Comment: likely candidal  Plan:  - miconazole  (MICOTIN) 2 % powder; Apply topically as needed for Itching  for up to 14 days  Dispense: 85 g; Refill: 2  - try and keep area clean and dry  - needs in person eval if things worsen or does not improve    4. Schizoaffective disorder, depressive type ([name])  Comment: tells me only had psych intake. Actual appt now for 3 weeks. Will bridge meds.  Plan:  - topiramate  (TOPAMAX ) 50 MG tablet; Take 1 tablet by mouth in the morning and 1 tablet before bedtime. Do all this for 21 days.  Dispense: 42 tablet; Refill: 0  - prazosin  (MINIPRESS ) 2 MG capsule; Take 1 capsule by mouth nightly  for 21 days  Dispense: 21 capsule; Refill: 0  - FLUoxetine  (PROZAC ) 40 MG capsule; Take 1 capsule by mouth daily  for 21 days  Dispense: 21 capsule; Refill: 0  - busPIRone  (BUSPAR ) 15 MG tablet; Take 1 tablet by mouth 3 (three) times daily  for 21 days  Dispense: 63 tablet; Refill: 0  - OLANZapine  (ZYPREXA ) 5 MG tablet; Take 2 tablets by mouth daily  for 21 days  Dispense: 42 tablet; Refill: 0    5. Left thigh pain  Comment: unable to eval via video. Needs in person eval. Does not sound like cellulitis or dvt. Perhaps muscle strain.  Plan:  - will arrange for in person eval  - pt advised to go to ed with redness,  swelling, fevers, sob    6. Morbid obesity ([name])  Comment: tolerating glp1 well so far  Plan:  - continue zepbound  2.5  - increase to 5 next refill if still doing OK    Clement Quivers, PA-C

## 2024-06-14 ENCOUNTER — Ambulatory Visit: Admitting: Internal Medicine

## 2024-06-14 ENCOUNTER — Telehealth (HOSPITAL_BASED_OUTPATIENT_CLINIC_OR_DEPARTMENT_OTHER): Payer: Self-pay

## 2024-06-14 NOTE — Telephone Encounter (Signed)
 Called [facility] at [phone]  Spoke to Dorothe from Intake dept who confirms that they do not have PT available in [place] at this time.   Referral faxed to [facility] at [phone]    [facility]  [address]  [address] [place], [place] [address]  Phone: [phone]   Fax: [phone]     Leodis Clement POUR., PA-C  P [facility] R[facility] Pool  Can you f/y on vna referral from 9/19 and make sure she is all set?    Thank you!  AM     Harlene JAYSON Collet, RN, 06/14/2024

## 2024-06-14 NOTE — Telephone Encounter (Signed)
 Cheryl Zimmerman [id number], 53 year old, female   Telephone Information:   Home Phone [phone]   Work Phone Not on file.   Mobile [phone]       Calls today: VNA/Nursing Home/Other Agency: non urgent call    Name of VNA VNA care   Name of person calling Mallory   Reason for call informing that are not able to take the patient due to unable to provide the services the patient is needing   Return phone number [phone]    NOTE: Urgent calls are a warm handoff       Person calling on behalf of patient: who is calling: VNA     CALL BACK NUMBER:   Best time to call back:   Cell phone:   Other phone:    Patient's language of care: English    Patient does not need an interpreter.    Patient's PCP: [name], MD    Primary Care Home Site:  [facility]

## 2024-06-17 ENCOUNTER — Telehealth (HOSPITAL_BASED_OUTPATIENT_CLINIC_OR_DEPARTMENT_OTHER): Payer: Self-pay

## 2024-06-17 ENCOUNTER — Other Ambulatory Visit: Payer: Self-pay

## 2024-06-17 NOTE — Telephone Encounter (Signed)
-----   Message from Clement Quivers sent at 06/14/2024  8:55 AM EDT -----  Pt is unable to make her appt today. She says she did not know about appt and does not have transportation.    Please reschedule with PCP asap.    Thanks!  Clement Quivers, PA-C

## 2024-06-17 NOTE — Telephone Encounter (Signed)
 [facility]  Call made to Cheryl Zimmerman to schedule an appointment for f/u with PCP. No interpreter needed.   WHO ARE YOU CALLING: Patient (self) answered at [phone] ; this team member left a message asking for a call back.   I have completed the following communication and reminder steps: Patient notified by telephone  Verneita Buss, 06/17/2024    On behalf of PCP: [name], MD

## 2024-06-22 ENCOUNTER — Inpatient Hospital Stay
Admit: 2024-06-22 | Discharge: 2024-06-22 | Disposition: A | Discharge status: Home / Self Care | Payer: MEDICARE | Arrived: AM | Attending: Emergency Medicine

## 2024-06-22 DIAGNOSIS — M791 Myalgia, unspecified site: Principal | ICD-10-CM

## 2024-06-22 DIAGNOSIS — E871 Hypo-osmolality and hyponatremia: Secondary | ICD-10-CM

## 2024-06-22 LAB — CBC WITH DIFFERENTIAL
Basophils %: 0.1 %
Basophils Absolute: 0.01 K/uL (ref 0.00–0.22)
Eosinophils %: 1.7 %
Eosinophils Absolute: 0.15 K/uL (ref 0.00–0.50)
Hematocrit: 43.1 % (ref 32.0–47.0)
Hemoglobin: 14.1 g/dL (ref 11.0–16.0)
Immature Granulocytes %: 0.2 %
Immature Granulocytes Absolute: 0.02 K/uL (ref 0.00–0.10)
Lymphocyte %: 30.7 %
Lymphocytes Absolute: 2.71 K/uL (ref 0.70–4.00)
MCH: 31 pg (ref 26.0–34.0)
MCHC: 32.7 g/dL (ref 31.0–37.0)
MCV: 94.7 fL (ref 80.0–100.0)
MPV: 11.1 fL (ref 9.1–12.4)
Monocytes %: 8.5 %
Monocytes Absolute: 0.75 K/uL (ref 0.36–0.77)
NRBC %: 0 % (ref 0.0–0.0)
NRBC Absolute: 0 K/uL (ref 0.00–2.00)
Neutrophil %: 58.8 %
Neutrophils Absolute: 5.18 K/uL (ref 1.50–7.95)
Platelets: 245 K/uL (ref 150–400)
RBC: 4.55 M/uL (ref 3.70–5.20)
RDW-CV: 13.1 % (ref 11.5–14.5)
RDW-SD: 45.1 fL (ref 35.0–51.0)
WBC: 8.8 K/uL (ref 4.0–11.0)

## 2024-06-22 LAB — COMPREHENSIVE METABOLIC PANEL
ALT: 24 U/L (ref 0–55)
AST: 35 U/L (ref 6–42)
Albumin: 3.6 g/dL (ref 3.2–5.0)
Alkaline phosphatase: 153 U/L — ABNORMAL HIGH (ref 30–130)
Anion Gap: 11 mmol/L (ref 3–14)
BUN: 8 mg/dL (ref 6–24)
Bilirubin, total: 0.7 mg/dL (ref 0.2–1.2)
CO2 (Bicarbonate): 22 mmol/L (ref 20–32)
Calcium: 9.4 mg/dL (ref 8.5–10.5)
Chloride: 100 mmol/L (ref 98–110)
Creatinine: 0.73 mg/dL (ref 0.55–1.30)
Glucose: 92 mg/dL (ref 70–139)
Potassium: 4.3 mmol/L (ref 3.6–5.2)
Protein, total: 7.2 g/dL (ref 6.0–8.4)
Sodium: 133 mmol/L — ABNORMAL LOW (ref 135–146)
eGFRcr: 98 mL/min/1.73m*2 (ref >=60)

## 2024-06-22 LAB — SDS, SERUM DRUG SCREEN
Acetaminophen level: <5 ug/mL (ref <=30)
Benzodiazepines screen, serum: NOT DETECTED
Ethanol level, plasma/serum: <10 mg/dL (ref <=10)
Salicylate level: <0.5 mg/dL (ref 0.0–29.9)
Tricyclics screen, serum: NOT DETECTED

## 2024-06-22 MED ORDER — naloxone (Narcan) 4 mg/0.1 mL nasal spray
4 | NASAL | 0 refills | 2.00000 days | Status: DC | PRN
Start: 2024-06-22 — End: 2024-06-22

## 2024-06-22 MED ORDER — acetaminophen (Tylenol) tablet 650 mg
325 | Freq: Once | ORAL | Status: DC
Start: 2024-06-22 — End: 2024-06-22

## 2024-06-22 MED ORDER — naloxone (Narcan) 4 mg/0.1 mL nasal spray
4 | NASAL | 0 refills | 2.00000 days | Status: AC | PRN
Start: 2024-06-22 — End: ?

## 2024-06-22 NOTE — Unmapped (Cosign Needed)
 [facility] EMERGENCY DEPARTMENT  [address]  [place] [address]        HPI   Chief Complaint   Patient presents with    Generalized Body Aches        53 past medical history of asthma, COPD, GERD, anxiety, depression, PTSD, polysubstance abuse, suicidal ideation/attempt presents endorsing that she is not been feeling well mentally endorses some myalgias.  Patient states mild is going for 10 years states that she is suicidal with intrusive thoughts of hurting herself.  Patient states has attempted in the past by jumping in front of a vehicle.          Patient History   Medical History[1]  Surgical History[2]  Family History[3]  Social History     Tobacco Use    Smoking status: Never    Smokeless tobacco: Never   Substance Use Topics    Alcohol use: Not on file    Drug use: Not on file       Review of Systems   Review of Systems   Constitutional:  Positive for fatigue. Negative for activity change, appetite change, fever and unexpected weight change.   HENT:  Negative for congestion.    Respiratory:  Negative for cough and wheezing.    Cardiovascular:  Negative for chest pain.   Gastrointestinal:  Negative for abdominal pain, nausea and vomiting.   Musculoskeletal:  Positive for myalgias.   Skin:  Negative for rash and wound.   Neurological:  Negative for dizziness and headaches.   Psychiatric/Behavioral:  Positive for suicidal ideas. Negative for agitation and confusion.        Physical Exam   ED Triage Vitals [06/22/24 0202]   Temp Pulse Resp BP   36.4 C (97.5 F) 99 16 115/65      SpO2 Temp Source Heart Rate Source Patient Position   (!) 94 % Oral -- --      BP Location FiO2 (%)     -- --       Physical Exam  Vitals and nursing note reviewed.   Constitutional:       General: She is not in acute distress.     Appearance: She is obese. She is not toxic-appearing.      Comments: Sleeping on stretcher   HENT:      Right Ear: External ear normal.      Left Ear: External ear normal.      Nose:  Nose normal.   Eyes:      Conjunctiva/sclera: Conjunctivae normal.      Pupils: Pupils are equal, round, and reactive to light.   Cardiovascular:      Rate and Rhythm: Normal rate.   Pulmonary:      Effort: Pulmonary effort is normal.   Musculoskeletal:         General: Normal range of motion.      Cervical back: Normal range of motion.      Right lower leg: No edema.      Left lower leg: No edema.   Skin:     General: Skin is warm.      Capillary Refill: Capillary refill takes less than 2 seconds.   Neurological:      General: No focal deficit present.      Mental Status: She is oriented to person, place, and time.   Psychiatric:         Behavior: Behavior is slowed.  Thought Content: Thought content includes suicidal ideation. Thought content does not include homicidal ideation. Thought content includes suicidal plan. Thought content does not include homicidal plan.       No orders to display       Labs Reviewed - No data to display    Glasgow Coma Scale Score: 15      ED Course & MDM   Diagnoses as of 06/22/24 0853   Myalgia   Other fatigue   Polysubstance abuse       Medical Decision Making  For period of time here in the Emergency Department patient was seen by the attending she denied any suicidal ideation to attending story appears to be inconsistent review of record shows patient has multiple ED visits with similar presentations.  In the setting of patient denying suicidal ideation will discharge patient with instruction to follow-up outpatient provide pain relief with Tylenol for myalgia encouraged to continue using this.  At time of discharge patient was noted to be clinically sober.  She did endorse using any unknown substance to attending.  Due to history of substance abuse did provide patient with Narcan.      Problems Addressed:  Myalgia: complicated acute illness or injury  Other fatigue: complicated acute illness or injury  Polysubstance abuse: complicated acute illness or injury    Amount  and/or Complexity of Data Reviewed  Labs: ordered.  ECG/medicine tests: ordered.    Risk  OTC drugs.                            Alm Moor, [place]  06/22/24 [phone]       [1] History reviewed. No pertinent past medical history.  [2] History reviewed. No pertinent surgical history.  [3] No family history on file.       Alm Moor, [place]  06/22/24 [phone]

## 2024-06-22 NOTE — ED Provider Notes (Signed)
 EMERGENCY DEPARTMENT ENCOUNTER    ATTENDING NOTE    Date of service: 06/22/2024  5:34 AM    Chief Complaint   Patient presents with    Generalized Body Aches     I discussed the case with PA and agree with plan.  Nursing notes reviewed     HISTORY OF PRESENT ILLNESS:    This is a 53 year old female with history of substance use disorder who presents to the ED with nonspecific complaints of generalized bodyaches she reports that the symptoms have been ongoing for over 10 years.  She does admit to substance use overnight.  She initially told the physician assistant that she was suicidal however she denied suicidal ideations to me.  She reports no abdominal pain no chest pain.    REVIEW OF SYSTEMS    Constitutional: no fevers  Pulmonary: no SOB, no cough  Cardiovascular: no CP  GI: no abd pain, no vomiting,  PAST MEDICAL HISTORY / PROBLEM LIST    Medical History[1]    Problem List[2]    PAST SURGICAL HISTORY    Surgical History[3]    MEDICATIONS     Previous Medications    No medications on file        ALLERGIES    Allergies[4]    SOCIAL HISTORY    Social History     Tobacco Use    Smoking status: Never    Smokeless tobacco: Never   Substance Use Topics    Alcohol use: Not on file     Social History     Substance and Sexual Activity   Drug Use Not on file       FAMILY HISTORY    Family History[5]    PHYSICAL EXAM    ED Triage Vitals [06/22/24 0202]   Temp Pulse Resp BP   36.4 C (97.5 F) 99 16 115/65      SpO2 Temp Source Heart Rate Source Patient Position   (!) 94 % Oral -- --      BP Location FiO2 (%)     -- --        Drowsy sleeping comfortably requires repeated verbal stimuli to arouse but then answers questions in one-word to 2 word responses there is no visible signs of head trauma pupils are equal and reactive lungs are clear to auscultation bilaterally abdomen is morbidly obese soft and nontender there is nonpitting edema with erythema of both extremities erythema consistent with chronic venous stasis.     Drowsy arouses to verbal stimuli pupils are equal and reactive with no facial asymmetry strength is bilateral and equal in all extremities        LABS    Labs Reviewed   COMPREHENSIVE METABOLIC PANEL - Abnormal       Result Value    Sodium 133 (*)     Potassium 4.3      Chloride 100      CO2 (Bicarbonate) 22      Anion Gap 11      BUN 8      Creatinine 0.73      eGFRcr 98      Glucose 92      Fasting? Unknown      Calcium 9.4      AST 35      ALT 24      Alkaline phosphatase 153 (*)     Protein, total 7.2      Albumin 3.6      Bilirubin, total 0.7  SDS, SERUM DRUG SCREEN - Normal    Acetaminophen level <5      Ethanol level, plasma/serum <10      Salicylate level <0.5      Benzodiazepines screen, serum Not Detected      Tricyclics screen, serum Not Detected     CBC W/DIFF    Narrative:     The following orders were created for panel order CBC and differential.  Procedure                               Abnormality         Status                     ---------                               -----------         ------                     CBC w/ Differential[[id number]]                               Final result                 Please view results for these tests on the individual orders.   CBC WITH DIFFERENTIAL    WBC 8.8      RBC 4.55      Hemoglobin 14.1      Hematocrit 43.1      MCV 94.7      MCH 31.0      MCHC 32.7      RDW-CV 13.1      RDW-SD 45.1      Platelets 245      MPV 11.1      Neutrophil % 58.8      Lymphocyte % 30.7      Monocytes % 8.5      Eosinophils % 1.7      Basophils % 0.1      Immature Granulocytes % 0.2      NRBC % 0.0      Neutrophils Absolute 5.18      Lymphocytes Absolute 2.71      Monocytes Absolute 0.75      Eosinophils Absolute 0.15      Basophils Absolute 0.01      Immature Granulocytes Absolute 0.02      NRBC Absolute 0.00     URINE DRUG SCREEN        RADIOLOGY    X-rays contemporaneously visualized and interpreted by me.      No orders to display       MEDICAL DECISION MAKING      Pertinent labs and imaging studies reviewed.  53 year old female with substance use disorder presents with generalized bodyaches she initially reported suicidal ideations but she denied any suicidal ideations to me.  She is currently sleeping comfortably she does admit to substance use overnight   Will reassess once more awake      ED COURSE    Diagnoses as of 06/22/24 0918   Myalgia   Other fatigue   Polysubstance abuse     CBC reveals no leukocytosis with a white count of 8.8 patient has a normal hemoglobin of 14 with normal platelets  of 245  Serum electrolytes reveal mild hyponatremia with a sodium of 133 with a normal potassium of 4.3 no anion gap acidosis with a bicarb of 22 patient has a normal creatinine of 0.73  LFTs revealed mild elevation in alkaline phosphatase but normal total bilirubin  Serum toxicology screen negative  After period of observation patient is now awake and stable for discharge    PROCEDURES    Procedures        CLINICAL IMPRESSION:      Generalized fatigue  Substance use disorder    FINAL DIAGNOSIS:    1. Myalgia    2. Other fatigue    3. Polysubstance abuse               [1] History reviewed. No pertinent past medical history.  [2] There is no problem list on file for this patient.   [3] History reviewed. No pertinent surgical history.  [4] No Known Allergies  [5] No family history on file.       Liza HILARIO Heather, MD  06/22/24 [phone]

## 2024-06-22 NOTE — ED Triage Notes (Signed)
 Pt reports to the ED for generalized body pain that is chronic.

## 2024-06-22 NOTE — Discharge Instructions (Addendum)
 You were evaluated for your muscle aches and fatigue you endorsed to you some sort of substance you observed here in the emergency department your exam is noted to be reassuring your labs are reassuring.  You are to follow-up with your primary care physician call if you develop any alarm symptoms.  I have also given you outpatient resources for substance abuse.

## 2024-06-22 NOTE — ED Notes (Signed)
 Patient eating [place] sandwich, speaking in complete sentences, using personal cell phone, has wheel chair that patient was brought in with, provided with discharge paper work.     Nat Houseman, RN  06/22/24 [phone]

## 2024-06-26 ENCOUNTER — Other Ambulatory Visit (HOSPITAL_BASED_OUTPATIENT_CLINIC_OR_DEPARTMENT_OTHER): Payer: Self-pay | Admitting: Internal Medicine

## 2024-06-26 NOTE — Telephone Encounter (Signed)
 PER who is calling: Patient (self), Cheryl Zimmerman is a 53 year old female has requested a refill of       zepbound .        Only complete for controlled medication:    Previously prescribed:  Start Date          End Date              Last Office Visit  : 05/14/2024 Lynch, Annmarie K., PA-C      Last Tele Visit:  06/13/2024      Last Physical Exam:  Not on file.    There are no preventive care reminders to display for this patient.    ADHD Med: Most Recent BP Reading(s)  05/14/24 : (!) 140/82   Most Recent Weight Reading(s)  05/29/23 : (!) 141.1 kg (311 lb 1.1 oz)      Documented patient preferred pharmacies:    [id number] [place], [place] - [address]  Phone: [phone] Fax: [phone]

## 2024-06-28 ENCOUNTER — Ambulatory Visit: Admitting: Internal Medicine

## 2024-06-28 MED ORDER — TIRZEPATIDE-WEIGHT MANAGEMENT 5 MG/0.5ML SC SOAJ
5.0000 mg | SUBCUTANEOUS | 0 refills | Status: DC
Start: 2024-06-28 — End: 2024-08-01

## 2024-06-28 NOTE — Telephone Encounter (Signed)
 Called but unable to reach her.    Can you let her know I increased dose of her GLP1?    Thanks!  Clement Quivers, PA-C

## 2024-07-01 NOTE — Telephone Encounter (Signed)
 Attempted x2 to call pt however unable to reach at this time.   Unable to lvm as mailbox is full    Lynch, Annmarie K., PA-C  AL    06/28/24  1:55 PM  Note      Called but unable to reach her.     Can you let her know I increased dose of her GLP1?     Thanks!  Clement Quivers, PA-C           Harlene JAYSON Collet, RN, 07/01/2024

## 2024-07-05 ENCOUNTER — Ambulatory Visit: Attending: Internal Medicine | Admitting: Internal Medicine

## 2024-07-05 ENCOUNTER — Other Ambulatory Visit: Payer: Self-pay

## 2024-07-05 ENCOUNTER — Encounter (HOSPITAL_BASED_OUTPATIENT_CLINIC_OR_DEPARTMENT_OTHER): Payer: Self-pay | Admitting: Internal Medicine

## 2024-07-05 DIAGNOSIS — Z1231 Encounter for screening mammogram for malignant neoplasm of breast: Secondary | ICD-10-CM | POA: Insufficient documentation

## 2024-07-05 DIAGNOSIS — K5903 Drug induced constipation: Secondary | ICD-10-CM | POA: Insufficient documentation

## 2024-07-05 DIAGNOSIS — T402X5A Adverse effect of other opioids, initial encounter: Secondary | ICD-10-CM | POA: Diagnosis present

## 2024-07-05 DIAGNOSIS — Z1211 Encounter for screening for malignant neoplasm of colon: Secondary | ICD-10-CM | POA: Diagnosis present

## 2024-07-05 DIAGNOSIS — Z90710 Acquired absence of both cervix and uterus: Secondary | ICD-10-CM | POA: Insufficient documentation

## 2024-07-05 MED ORDER — LINACLOTIDE 145 MCG PO CAPS
145.0000 ug | ORAL_CAPSULE | Freq: Every day | ORAL | 1 refills | Status: AC
Start: 2024-07-05 — End: 2024-09-03

## 2024-07-05 NOTE — Progress Notes (Signed)
 Cheryl Zimmerman 32F  Post ER visit/initial visit with this provider    ER visit to [facility] for med induced constipation associated with diffuse abd pain  On suboxone   Last decent BM was over 2 weeks ago  Regimen: miralax  bid, lactulose  5 syringes daily  Keeps herself well hydrated  Pushes fruit and veggies  On GLP1 and has been losing weight    RHM  Mammogram at [facility]    PE  Gen: uses motorized wheelchair, in NAD, good historian  BP 135/80 (Site: RA)   Pulse 88   Temp 98 F (36.7 C) (Temporal)   Wt (!) 143.3 kg (316 lb)   SpO2 96%   BMI 52.59 kg/m     NCAT, EOMI, no conj injection, neck supple, no LAD of head/neck/underarms  Cor: nl s1 s2 no m  Lungs: CTAB  Abd: +BS, soft, TTP LUQ, abd not distended, unable to appreciate organomegaly 2/2 body habitus  LE: no edema, no lesions    A/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

## 2024-07-08 ENCOUNTER — Other Ambulatory Visit (HOSPITAL_BASED_OUTPATIENT_CLINIC_OR_DEPARTMENT_OTHER): Payer: Self-pay | Admitting: Internal Medicine

## 2024-07-08 DIAGNOSIS — F1120 Opioid dependence, uncomplicated: Secondary | ICD-10-CM

## 2024-07-08 DIAGNOSIS — F251 Schizoaffective disorder, depressive type: Secondary | ICD-10-CM

## 2024-07-08 MED ORDER — QUETIAPINE FUMARATE 100 MG PO TABS: 100.0000 mg | ORAL_TABLET | Freq: Every evening | ORAL | 2 refills | Status: DC

## 2024-07-08 NOTE — Telephone Encounter (Signed)
 PER who is calling: Pharmacy, Cheryl Zimmerman is a 53 year old female has requested a refill of       Clonazepam .        Only complete for controlled medication:    Previously prescribed:  Start Date 06/13/24         End Date 07/13/24             Last Office Visit  : 07/05/2024 [name], MD      Last Tele Visit:  06/13/2024      Last Physical Exam:  Not on file.    There are no preventive care reminders to display for this patient.    wOther Med Adult:  Most Recent BP Reading(s)  07/05/24 : 135/80        Cholesterol (mg/dL)   Date Value   92/76/7976 263 (H)     LOW DENSITY LIPOPROTEIN DIRECT (mg/dL)   Date Value   92/76/7976 192 (H)     HIGH DENSITY LIPOPROTEIN (mg/dL)   Date Value   92/76/7976 40     TRIGLYCERIDES (mg/dL)   Date Value   92/76/7976 301 (H)         THYROID  SCREEN TSH REFLEX FT4 (uIU/mL)   Date Value   05/14/2024 2.040         TSH (THYROID  STIM HORMONE) (uIU/mL)   Date Value   12/10/2022 1.190       HEMOGLOBIN A1C (%)   Date Value   07/15/2022 6.5 (H)     HEMOGLOBIN A1C CARE EVERYWHERE (%)   Date Value   12/25/2023 5.8 (H)       No results found for: POCA1C      INR (no units)   Date Value   06/02/2011 1.0 (L)   06/02/2011 < 1.0 (*L)     INR CARE EVERYWHERE (no units)   Date Value   10/16/2021 1.0   09/23/2021 0.9   01/06/2021 1.1   11/11/2020 1.0   07/05/2020 0.9   07/04/2020 0.8 (L)   03/13/2020 1.0       SODIUM (mmol/L)   Date Value   05/14/2024 141       POTASSIUM (mmol/L)   Date Value   05/14/2024 4.6           CREATININE (mg/dL)   Date Value   91/73/7974 0.8       Documented patient preferred pharmacies:    [id number], [place] - [address]  Phone: [phone] Fax: [phone]

## 2024-07-24 ENCOUNTER — Other Ambulatory Visit (HOSPITAL_BASED_OUTPATIENT_CLINIC_OR_DEPARTMENT_OTHER): Payer: Self-pay | Admitting: Physician Assistant

## 2024-07-24 DIAGNOSIS — G894 Chronic pain syndrome: Secondary | ICD-10-CM

## 2024-07-24 NOTE — Telephone Encounter (Signed)
 PER who is calling: Patient (self), Cheryl Zimmerman is a 53 year old female has requested a refill of       gabapentin .      Previously prescribed:  Start Date 05/09/2024         End Date 08/07/2024       Last Office Visit  : 07/05/2024 [name], MD      Last Tele Visit:  06/13/2024      Last Physical Exam:  Not on file.    There are no preventive care reminders to display for this patient.    Other Med Adult:  Most Recent BP Reading(s)  07/05/24 : 135/80        Cholesterol (mg/dL)   Date Value   92/76/7976 263 (H)     LOW DENSITY LIPOPROTEIN DIRECT (mg/dL)   Date Value   92/76/7976 192 (H)     HIGH DENSITY LIPOPROTEIN (mg/dL)   Date Value   92/76/7976 40     TRIGLYCERIDES (mg/dL)   Date Value   92/76/7976 301 (H)         THYROID  SCREEN TSH REFLEX FT4 (uIU/mL)   Date Value   05/14/2024 2.040         TSH (THYROID  STIM HORMONE) (uIU/mL)   Date Value   12/10/2022 1.190       HEMOGLOBIN A1C (%)   Date Value   07/15/2022 6.5 (H)     HEMOGLOBIN A1C CARE EVERYWHERE (%)   Date Value   12/25/2023 5.8 (H)       No results found for: POCA1C      INR (no units)   Date Value   06/02/2011 1.0 (L)   06/02/2011 < 1.0 (*L)     INR CARE EVERYWHERE (no units)   Date Value   10/16/2021 1.0   09/23/2021 0.9   01/06/2021 1.1   11/11/2020 1.0   07/05/2020 0.9   07/04/2020 0.8 (L)   03/13/2020 1.0       SODIUM (mmol/L)   Date Value   05/14/2024 141       POTASSIUM (mmol/L)   Date Value   05/14/2024 4.6           CREATININE (mg/dL)   Date Value   91/73/7974 0.8       Documented patient preferred pharmacies:    [id number], [place] - [address]  Phone: [phone] Fax: [phone]

## 2024-07-26 ENCOUNTER — Telehealth (HOSPITAL_BASED_OUTPATIENT_CLINIC_OR_DEPARTMENT_OTHER): Payer: Self-pay | Admitting: Internal Medicine

## 2024-07-26 NOTE — Telephone Encounter (Signed)
 This request is for a GLP-1 dose increase per  patient request.       Please review if the patient should maintain this dose or increase to the next step. If an increase is appropriate, please create a new order and send to the preferred pharmacy.      Thank you!

## 2024-07-28 ENCOUNTER — Other Ambulatory Visit (HOSPITAL_BASED_OUTPATIENT_CLINIC_OR_DEPARTMENT_OTHER): Payer: Self-pay | Admitting: Physician Assistant

## 2024-07-28 DIAGNOSIS — G894 Chronic pain syndrome: Secondary | ICD-10-CM

## 2024-07-28 NOTE — Telephone Encounter (Signed)
 PER who is calling: Patient (self), Cheryl Zimmerman is a 53 year old female has requested a refill of       baclofen .                    Last Office Visit  : 07/05/2024 [name], MD      Last Tele Visit:  06/13/2024      Last Physical Exam:  Not on file.    There are no preventive care reminders to display for this patient.    Other Med Adult:  Most Recent BP Reading(s)  07/05/24 : 135/80        Cholesterol (mg/dL)   Date Value   92/76/7976 263 (H)     LOW DENSITY LIPOPROTEIN DIRECT (mg/dL)   Date Value   92/76/7976 192 (H)     HIGH DENSITY LIPOPROTEIN (mg/dL)   Date Value   92/76/7976 40     TRIGLYCERIDES (mg/dL)   Date Value   92/76/7976 301 (H)         THYROID  SCREEN TSH REFLEX FT4 (uIU/mL)   Date Value   05/14/2024 2.040         TSH (THYROID  STIM HORMONE) (uIU/mL)   Date Value   12/10/2022 1.190       HEMOGLOBIN A1C (%)   Date Value   07/15/2022 6.5 (H)     HEMOGLOBIN A1C CARE EVERYWHERE (%)   Date Value   12/25/2023 5.8 (H)       No results found for: POCA1C      INR (no units)   Date Value   06/02/2011 1.0 (L)   06/02/2011 < 1.0 (*L)     INR CARE EVERYWHERE (no units)   Date Value   10/16/2021 1.0   09/23/2021 0.9   01/06/2021 1.1   11/11/2020 1.0   07/05/2020 0.9   07/04/2020 0.8 (L)   03/13/2020 1.0       SODIUM (mmol/L)   Date Value   05/14/2024 141       POTASSIUM (mmol/L)   Date Value   05/14/2024 4.6           CREATININE (mg/dL)   Date Value   91/73/7974 0.8       Documented patient preferred pharmacies:    [id number], [place] - [address]  Phone: [phone] Fax: [phone]

## 2024-07-29 ENCOUNTER — Other Ambulatory Visit (HOSPITAL_BASED_OUTPATIENT_CLINIC_OR_DEPARTMENT_OTHER): Payer: Self-pay | Admitting: Internal Medicine

## 2024-07-29 ENCOUNTER — Telehealth (HOSPITAL_BASED_OUTPATIENT_CLINIC_OR_DEPARTMENT_OTHER): Payer: Self-pay

## 2024-07-29 ENCOUNTER — Other Ambulatory Visit (HOSPITAL_BASED_OUTPATIENT_CLINIC_OR_DEPARTMENT_OTHER): Payer: Self-pay | Admitting: Physician Assistant

## 2024-07-29 DIAGNOSIS — F251 Schizoaffective disorder, depressive type: Secondary | ICD-10-CM

## 2024-07-29 DIAGNOSIS — L304 Erythema intertrigo: Secondary | ICD-10-CM

## 2024-07-29 MED ORDER — MICONAZOLE NITRATE 2 % EX POWD
CUTANEOUS | 2 refills | Status: AC | PRN
Start: 2024-07-29 — End: 2024-10-29

## 2024-07-29 NOTE — Telephone Encounter (Signed)
 Pt calling in-in home PT referral was placed in August which pt states that she hasn't been getting- pt states she spoke with her child psychotherapist and was told that there needed to be more specific higher level (is the term she used) coding on it- looking at the referral I suspect it needs more specifics in terms of PT locations etc. She gave me a phone # but wasn't clear on who this is for. [phone]     To team for followup thank you

## 2024-07-29 NOTE — Telephone Encounter (Signed)
 PER who is calling: Pharmacy, Cheryl Zimmerman is a 53 year old female has requested a refill of       Buspirone  15mg .      Last Office Visit  : 07/05/2024 [name], MD      Last Tele Visit:  06/13/2024      Last Physical Exam:  Not on file.    There are no preventive care reminders to display for this patient.    Other Med Adult:  Most Recent BP Reading(s)  07/05/24 : 135/80        Cholesterol (mg/dL)   Date Value   92/76/7976 263 (H)     LOW DENSITY LIPOPROTEIN DIRECT (mg/dL)   Date Value   92/76/7976 192 (H)     HIGH DENSITY LIPOPROTEIN (mg/dL)   Date Value   92/76/7976 40     TRIGLYCERIDES (mg/dL)   Date Value   92/76/7976 301 (H)         THYROID  SCREEN TSH REFLEX FT4 (uIU/mL)   Date Value   05/14/2024 2.040         TSH (THYROID  STIM HORMONE) (uIU/mL)   Date Value   12/10/2022 1.190       HEMOGLOBIN A1C (%)   Date Value   07/15/2022 6.5 (H)     HEMOGLOBIN A1C CARE EVERYWHERE (%)   Date Value   12/25/2023 5.8 (H)       No results found for: POCA1C      INR (no units)   Date Value   06/02/2011 1.0 (L)   06/02/2011 < 1.0 (*L)     INR CARE EVERYWHERE (no units)   Date Value   10/16/2021 1.0   09/23/2021 0.9   01/06/2021 1.1   11/11/2020 1.0   07/05/2020 0.9   07/04/2020 0.8 (L)   03/13/2020 1.0       SODIUM (mmol/L)   Date Value   05/14/2024 141       POTASSIUM (mmol/L)   Date Value   05/14/2024 4.6           CREATININE (mg/dL)   Date Value   91/73/7974 0.8       Documented patient preferred pharmacies:    [id number], [place] - [address]  Phone: [phone] Fax: [phone]

## 2024-07-29 NOTE — Telephone Encounter (Signed)
 PER who is calling: Pharmacy, Cheryl Zimmerman is a 53 year old female has requested a refill of       Zepbound .        Last Office Visit  : 07/05/2024 [name], MD      Last Tele Visit:  06/13/2024      Last Physical Exam:  Not on file.    There are no preventive care reminders to display for this patient.    Other Med Adult:  Most Recent BP Reading(s)  07/05/24 : 135/80        Cholesterol (mg/dL)   Date Value   92/76/7976 263 (H)     LOW DENSITY LIPOPROTEIN DIRECT (mg/dL)   Date Value   92/76/7976 192 (H)     HIGH DENSITY LIPOPROTEIN (mg/dL)   Date Value   92/76/7976 40     TRIGLYCERIDES (mg/dL)   Date Value   92/76/7976 301 (H)         THYROID  SCREEN TSH REFLEX FT4 (uIU/mL)   Date Value   05/14/2024 2.040         TSH (THYROID  STIM HORMONE) (uIU/mL)   Date Value   12/10/2022 1.190       HEMOGLOBIN A1C (%)   Date Value   07/15/2022 6.5 (H)     HEMOGLOBIN A1C CARE EVERYWHERE (%)   Date Value   12/25/2023 5.8 (H)       No results found for: POCA1C      INR (no units)   Date Value   06/02/2011 1.0 (L)   06/02/2011 < 1.0 (*L)     INR CARE EVERYWHERE (no units)   Date Value   10/16/2021 1.0   09/23/2021 0.9   01/06/2021 1.1   11/11/2020 1.0   07/05/2020 0.9   07/04/2020 0.8 (L)   03/13/2020 1.0       SODIUM (mmol/L)   Date Value   05/14/2024 141       POTASSIUM (mmol/L)   Date Value   05/14/2024 4.6           CREATININE (mg/dL)   Date Value   91/73/7974 0.8       Documented patient preferred pharmacies:    [id number], [place] - [address]  Phone: [phone] Fax: [phone]

## 2024-07-29 NOTE — Telephone Encounter (Signed)
 Pt reports insurance company called her and informed her that insurance requesting a higher standard prescription for in-home PT.   Reports that the referral received back in August does not have enough detail/information regarding pt requirements for home PT.     States her [facility] contact person's name is Mallie  She does not have Kate's number however reports she was given a number for the team to call regarding referral :    [phone]

## 2024-07-29 NOTE — Telephone Encounter (Signed)
 RN placed out going call to [facility] at number provided by pt  Per [facility] staff member, she is unsure what is required and has not heard of this type of prescription before.  States she will reach out to the patient's care partner to contact PCP office and, or patient regarding what is needed to process her [facility] referral for in-home PT services.     Reports patient's care partner's name is:   Rocky Saltness  Expected turn around time for call back in 24-48 hours  Reference number: [id number]

## 2024-07-29 NOTE — Telephone Encounter (Signed)
 PER who is calling: Pharmacy, Cheryl Zimmerman is a 53 year old female has requested a refill of miconazole  powder.    Last Office Visit  : 07/05/2024 [name], MD  Last Tele Visit:  06/13/2024  Last Physical Exam:  Not on file.    There are no preventive care reminders to display for this patient.    Other Med Adult:  Most Recent BP Reading(s)  07/05/24 : 135/80        Cholesterol (mg/dL)   Date Value   92/76/7976 263 (H)     LOW DENSITY LIPOPROTEIN DIRECT (mg/dL)   Date Value   92/76/7976 192 (H)     HIGH DENSITY LIPOPROTEIN (mg/dL)   Date Value   92/76/7976 40     TRIGLYCERIDES (mg/dL)   Date Value   92/76/7976 301 (H)         THYROID  SCREEN TSH REFLEX FT4 (uIU/mL)   Date Value   05/14/2024 2.040         TSH (THYROID  STIM HORMONE) (uIU/mL)   Date Value   12/10/2022 1.190       HEMOGLOBIN A1C (%)   Date Value   07/15/2022 6.5 (H)     HEMOGLOBIN A1C CARE EVERYWHERE (%)   Date Value   12/25/2023 5.8 (H)       No results found for: POCA1C      INR (no units)   Date Value   06/02/2011 1.0 (L)   06/02/2011 < 1.0 (*L)     INR CARE EVERYWHERE (no units)   Date Value   10/16/2021 1.0   09/23/2021 0.9   01/06/2021 1.1   11/11/2020 1.0   07/05/2020 0.9   07/04/2020 0.8 (L)   03/13/2020 1.0       SODIUM (mmol/L)   Date Value   05/14/2024 141       POTASSIUM (mmol/L)   Date Value   05/14/2024 4.6           CREATININE (mg/dL)   Date Value   91/73/7974 0.8       Documented patient preferred pharmacies:    [id number], [place] - [address]  Phone: [phone] Fax: [phone]

## 2024-07-30 ENCOUNTER — Ambulatory Visit: Admitting: Internal Medicine

## 2024-08-01 ENCOUNTER — Ambulatory Visit: Admitting: Internal Medicine

## 2024-08-01 ENCOUNTER — Telehealth (HOSPITAL_BASED_OUTPATIENT_CLINIC_OR_DEPARTMENT_OTHER): Payer: Self-pay

## 2024-08-01 DIAGNOSIS — Z5321 Procedure and treatment not carried out due to patient leaving prior to being seen by health care provider: Secondary | ICD-10-CM

## 2024-08-01 NOTE — Telephone Encounter (Signed)
 Cheryl Zimmerman [id number], 53 year old, female   Telephone Information:   Home Phone [phone]   Work Phone Not on file.   Mobile [phone]       Calls today: [facility]   Name of person calling Erin, Care Manager   Reason for call Returning nurses call.Wanted to clarify that the patient is requesting for at home physical therapy   Return phone number (No Call Back Needed)     NOTE: Urgent calls are a warm handoff       Person calling on behalf of patient: Rocky,     CALL BACK NUMBER:   Best time to call back:   Cell phone:   Other phone:    Patient's language of care: English    Patient does not need an interpreter.    Patient's PCP: [name], MD    Primary Care Home Site:  [facility]

## 2024-08-01 NOTE — Progress Notes (Signed)
Called pt x 2. Unable to reach her for her scheduled televisit.  Left VM message encouraging her to contact clinic to reschedule her appt.  Left clinic tel # in message for her convenience.

## 2024-08-01 NOTE — Telephone Encounter (Signed)
 Calls today: [facility]   Name of person calling Erin, Care Manager   RE: to clarify that the patient is requesting for at home physical therapy  Return phone number (No Call Back Needed)     NOTE: Urgent calls are a warm handoff

## 2024-08-01 NOTE — Telephone Encounter (Signed)
 Outreach Attempt  Call made to Barnie Reese to reschedule televisit missed with Dr. Yvone or any provider per patient preference. No interpreter needed.  WHO ARE YOU CALLING: Patient (self) did not answer at [phone] ; this team member left a message asking for a call back.  I have completed the following communication and reminder steps: Patient notified by telephone  Camellia Blas, 08/01/2024    On behalf of PCP: [name], MD

## 2024-08-02 NOTE — Telephone Encounter (Signed)
 Increased tirzepatide  dose of 7.5 mg sent via a different encounter

## 2024-08-03 ENCOUNTER — Other Ambulatory Visit (HOSPITAL_BASED_OUTPATIENT_CLINIC_OR_DEPARTMENT_OTHER): Payer: Self-pay | Admitting: Physician Assistant

## 2024-08-03 DIAGNOSIS — F251 Schizoaffective disorder, depressive type: Secondary | ICD-10-CM

## 2024-08-03 NOTE — Telephone Encounter (Signed)
 PER who is calling: Pharmacy, Cheryl Zimmerman is a 53 year old female has requested a refill of       buspar .              Last Office Visit  : 08/01/2024 [name]      Last Tele Visit:  08/01/2024      Last Physical Exam:  Not on file.    There are no preventive care reminders to display for this patient.    Other Med Adult:  Most Recent BP Reading(s)  07/05/24 : 135/80        Cholesterol (mg/dL)   Date Value   92/76/7976 263 (H)     LOW DENSITY LIPOPROTEIN DIRECT (mg/dL)   Date Value   92/76/7976 192 (H)     HIGH DENSITY LIPOPROTEIN (mg/dL)   Date Value   92/76/7976 40     TRIGLYCERIDES (mg/dL)   Date Value   92/76/7976 301 (H)         THYROID  SCREEN TSH REFLEX FT4 (uIU/mL)   Date Value   05/14/2024 2.040         TSH (THYROID  STIM HORMONE) (uIU/mL)   Date Value   12/10/2022 1.190       HEMOGLOBIN A1C (%)   Date Value   07/15/2022 6.5 (H)     HEMOGLOBIN A1C CARE EVERYWHERE (%)   Date Value   12/25/2023 5.8 (H)       No results found for: POCA1C      INR (no units)   Date Value   06/02/2011 1.0 (L)   06/02/2011 < 1.0 (*L)     INR CARE EVERYWHERE (no units)   Date Value   10/16/2021 1.0   09/23/2021 0.9   01/06/2021 1.1   11/11/2020 1.0   07/05/2020 0.9   07/04/2020 0.8 (L)   03/13/2020 1.0       SODIUM (mmol/L)   Date Value   05/14/2024 141       POTASSIUM (mmol/L)   Date Value   05/14/2024 4.6           CREATININE (mg/dL)   Date Value   91/73/7974 0.8       Documented patient preferred pharmacies:    [id number] [place] - [address]  Phone: [phone] Fax: [phone]

## 2024-08-06 ENCOUNTER — Other Ambulatory Visit (HOSPITAL_BASED_OUTPATIENT_CLINIC_OR_DEPARTMENT_OTHER): Payer: Self-pay | Admitting: Internal Medicine

## 2024-08-06 NOTE — Telephone Encounter (Signed)
 Duplicate refill

## 2024-08-12 ENCOUNTER — Telehealth (HOSPITAL_BASED_OUTPATIENT_CLINIC_OR_DEPARTMENT_OTHER): Payer: Self-pay

## 2024-08-12 NOTE — Telephone Encounter (Signed)
 Cheryl Zimmerman [id number], 53 year old, female   Telephone Information:   Home Phone [phone]   Work Phone Not on file.   Mobile [phone]       Calls today: Clinical Questions (NON-SICK CLINICAL QUESTIONS ONLY)    Name of person calling - Cheryl Zimmerman  Specific nature of request - says insurance is not approving her at home PT, says that referral prescription is not specific enough  Return phone number [phone]     Person calling on behalf of patient: who is calling: Patient (self)    CALL BACK NUMBER: [phone]   Best time to call back:   Cell phone:   Other phone:    Patient's language of care: English    Patient does not need an interpreter.    Patient's PCP: [name], MD    Primary Care Home Site:  [facility]

## 2024-08-13 ENCOUNTER — Encounter (HOSPITAL_BASED_OUTPATIENT_CLINIC_OR_DEPARTMENT_OTHER): Payer: Self-pay

## 2024-08-13 ENCOUNTER — Telehealth (HOSPITAL_BASED_OUTPATIENT_CLINIC_OR_DEPARTMENT_OTHER): Payer: Self-pay

## 2024-08-13 NOTE — Telephone Encounter (Addendum)
 RE: - says insurance is not approving her at home PT, says that referral prescription is not specific enough    Patient contacted and  identified. Lysle of call discussed as written.     The proper paperwork should CCA,     DISPO: *** agrees with plan and disposition and pt is with no further questions or concerns at this time.      Katyana Trolinger Colon-Guerrero, RN, 08/13/2024

## 2024-08-13 NOTE — Progress Notes (Signed)
 To [employer] Staff:  I am a Patient Navigator in the Pulmonary Department.      Please call the patient, as they are overdue for follow-up regarding a CT scan done on 12/30/22.   Please schedule a televisit with the PCP to discuss appropriate next steps.     Thank you,   Rollene Serve, RN

## 2024-08-13 NOTE — Telephone Encounter (Signed)
 Outreach Attempt  Call made to Rhaelyn Giron to schedule televisit for Ct scan done 12/30/22. No interpreter needed.      To [employer] Staff:  I am a Patient Navigator in the Pulmonary Department.      Please call the patient, as they are overdue for follow-up regarding a CT scan done on 12/30/22.   Please schedule a televisit with the PCP to discuss appropriate next steps.     Thank you,   Rollene Serve, RN     WHO ARE YOU CALLING: Patient (self) did not answer at [phone] ; this team member left a message asking for a call back.  I have completed the following communication and reminder steps: Patient notified by telephone  Camellia Blas, 08/13/2024    On behalf of PCP: [name], MD

## 2024-08-13 NOTE — Telephone Encounter (Incomplete Revision)
 RE: - says insurance is not approving her at home PT, says that referral prescription is not specific enough    Patient contacted and  identified. Lysle of call discussed as written.     The proper paperwork should CCA,     DISPO: *** agrees with plan and disposition and pt is with no further questions or concerns at this time.      Katyana Trolinger Colon-Guerrero, RN, 08/13/2024

## 2024-08-22 ENCOUNTER — Other Ambulatory Visit (HOSPITAL_BASED_OUTPATIENT_CLINIC_OR_DEPARTMENT_OTHER): Payer: Self-pay

## 2024-08-22 DIAGNOSIS — F1120 Opioid dependence, uncomplicated: Secondary | ICD-10-CM

## 2024-08-22 DIAGNOSIS — G894 Chronic pain syndrome: Secondary | ICD-10-CM

## 2024-08-22 NOTE — Telephone Encounter (Signed)
 PER who is calling: Pharmacy, Harjit Douds is a 53 year old female has requested a refill of       SUBOXONE .        Only complete for controlled medication:    Previously prescribed:  Start Date [id number]         End Date [id number]             Last Office Visit  : 08/13/2024 [name]      Last Tele Visit: CHACTRLTELEVISIT@      Last Physical Exam: LASTWELLVISITDATE@    There are no preventive care reminders to display for this patient.    Other Med Adult:  Most Recent BP Reading(s)  07/05/24 : 135/80        Cholesterol (mg/dL)   Date Value   92/76/7976 263 (H)     LOW DENSITY LIPOPROTEIN DIRECT (mg/dL)   Date Value   92/76/7976 192 (H)     HIGH DENSITY LIPOPROTEIN (mg/dL)   Date Value   92/76/7976 40     TRIGLYCERIDES (mg/dL)   Date Value   92/76/7976 301 (H)         THYROID  SCREEN TSH REFLEX FT4 (uIU/mL)   Date Value   05/14/2024 2.040         TSH (THYROID  STIM HORMONE) (uIU/mL)   Date Value   12/10/2022 1.190       HEMOGLOBIN A1C (%)   Date Value   07/15/2022 6.5 (H)     HEMOGLOBIN A1C CARE EVERYWHERE (%)   Date Value   12/25/2023 5.8 (H)       No results found for: POCA1C      INR (no units)   Date Value   06/02/2011 1.0 (L)   06/02/2011 < 1.0 (*L)     INR CARE EVERYWHERE (no units)   Date Value   10/16/2021 1.0   09/23/2021 0.9   01/06/2021 1.1   11/11/2020 1.0   07/05/2020 0.9   07/04/2020 0.8 (L)   03/13/2020 1.0       SODIUM (mmol/L)   Date Value   05/14/2024 141       POTASSIUM (mmol/L)   Date Value   05/14/2024 4.6           CREATININE (mg/dL)   Date Value   91/73/7974 0.8       Documented patient preferred pharmacies:    [id number] [place], [place] - [address]  Phone: [phone] Fax: [phone]

## 2024-08-23 ENCOUNTER — Telehealth (HOSPITAL_BASED_OUTPATIENT_CLINIC_OR_DEPARTMENT_OTHER): Payer: Self-pay | Admitting: Internal Medicine

## 2024-08-23 MED ORDER — BUPRENORPHINE HCL-NALOXONE HCL 8-2 MG SL FILM: ORAL_FILM | SUBLINGUAL | 0 refills | Status: DC

## 2024-08-23 NOTE — Telephone Encounter (Signed)
 Cheryl Mac LABOR, MD  AG    08/23/24  1:23 AM  Note      Please fax mammogram requisition to [facility].  Tel radiology [phone]     Then circle back to patient so she may schedule an appointment.      Thank you            Planned Care Outreach  Call made to Cheryl Zimmerman to coordinate care let her know that the Mammogram requisition had been faxed to [facility] to please give them a call to schedule the visit   . No interpreter needed.   WHO ARE YOU CALLING: Patient (self) answered at [phone] ; will later today to set the visit .  I have completed the following communication and reminder steps:   Eric Adrien Cliche, 08/23/2024    On behalf of PCP: Cheryl Mac LABOR, MD

## 2024-08-23 NOTE — Telephone Encounter (Signed)
 Please fax mammogram requisition to [facility].  Tel radiology [phone]    Then circle back to patient so she may schedule an appointment.     Thank you

## 2024-08-25 ENCOUNTER — Other Ambulatory Visit (HOSPITAL_BASED_OUTPATIENT_CLINIC_OR_DEPARTMENT_OTHER): Payer: Self-pay | Admitting: Physician Assistant

## 2024-08-25 DIAGNOSIS — F251 Schizoaffective disorder, depressive type: Secondary | ICD-10-CM

## 2024-08-25 NOTE — Telephone Encounter (Signed)
 PER who is calling: Pharmacy, Cheryl Zimmerman is a 53 year old female has requested a refill of       Buspar  .          Last Office Visit  : 08/23/2024 [name], MD      Last Tele Visit:  08/01/2024      Last Physical Exam:  Not on file.    There are no preventive care reminders to display for this patient.    Other Med Adult:  Most Recent BP Reading(s)  07/05/24 : 135/80        Cholesterol (mg/dL)   Date Value   92/76/7976 263 (H)     LOW DENSITY LIPOPROTEIN DIRECT (mg/dL)   Date Value   92/76/7976 192 (H)     HIGH DENSITY LIPOPROTEIN (mg/dL)   Date Value   92/76/7976 40     TRIGLYCERIDES (mg/dL)   Date Value   92/76/7976 301 (H)         THYROID  SCREEN TSH REFLEX FT4 (uIU/mL)   Date Value   05/14/2024 2.040         TSH (THYROID  STIM HORMONE) (uIU/mL)   Date Value   12/10/2022 1.190       HEMOGLOBIN A1C (%)   Date Value   07/15/2022 6.5 (H)     HEMOGLOBIN A1C CARE EVERYWHERE (%)   Date Value   12/25/2023 5.8 (H)       No results found for: POCA1C      INR (no units)   Date Value   06/02/2011 1.0 (L)   06/02/2011 < 1.0 (*L)     INR CARE EVERYWHERE (no units)   Date Value   10/16/2021 1.0   09/23/2021 0.9   01/06/2021 1.1   11/11/2020 1.0   07/05/2020 0.9   07/04/2020 0.8 (L)   03/13/2020 1.0       SODIUM (mmol/L)   Date Value   05/14/2024 141       POTASSIUM (mmol/L)   Date Value   05/14/2024 4.6           CREATININE (mg/dL)   Date Value   91/73/7974 0.8       Documented patient preferred pharmacies:    [id number] [place], [place] - [address]  Phone: [phone] Fax: [phone]

## 2024-08-26 ENCOUNTER — Other Ambulatory Visit (HOSPITAL_BASED_OUTPATIENT_CLINIC_OR_DEPARTMENT_OTHER): Payer: Self-pay | Admitting: Internal Medicine

## 2024-08-26 ENCOUNTER — Encounter (HOSPITAL_BASED_OUTPATIENT_CLINIC_OR_DEPARTMENT_OTHER): Payer: Self-pay | Admitting: Internal Medicine

## 2024-08-26 DIAGNOSIS — E079 Disorder of thyroid, unspecified: Secondary | ICD-10-CM | POA: Insufficient documentation

## 2024-08-26 DIAGNOSIS — F251 Schizoaffective disorder, depressive type: Secondary | ICD-10-CM

## 2024-08-26 DIAGNOSIS — T402X5A Adverse effect of other opioids, initial encounter: Secondary | ICD-10-CM

## 2024-08-26 MED ORDER — FLUOXETINE HCL 40 MG PO CAPS: 40.0000 mg | ORAL_CAPSULE | Freq: Every day | ORAL | 0 refills | Status: AC

## 2024-08-26 MED ORDER — TIRZEPATIDE-WEIGHT MANAGEMENT 7.5 MG/0.5ML SC SOAJ: 7.5000 mg | SUBCUTANEOUS | 0 refills | Status: DC

## 2024-08-26 MED ORDER — MAGNESIUM GLUCONATE 500 MG PO TABS: 500.0000 mg | ORAL_TABLET | Freq: Two times a day (BID) | ORAL | 5 refills | Status: AC

## 2024-08-26 MED ORDER — LEVOTHYROXINE SODIUM 25 MCG PO TABS: 25.0000 ug | ORAL_TABLET | Freq: Every morning | ORAL | 0 refills | Status: AC

## 2024-08-26 NOTE — Telephone Encounter (Addendum)
 PER who is calling: Patient (self), Cheryl Zimmerman is a 53 year old female has requested a refill of       LEVOTHYROXINE .FLUOXETINE , MAGNESIUM ,     VIT B-1 NOT ALLOWED TO BE ADDED TO ORDER      Last Office Visit  : 08/25/2024 Nestor, Meghan, PA-C      Last Tele Visit: CHACTRLTELEVISIT@      Last Physical Exam: LASTWELLVISITDATE@    There are no preventive care reminders to display for this patient.    Other Med Adult:  Most Recent BP Reading(s)  07/05/24 : 135/80        Cholesterol (mg/dL)   Date Value   92/76/7976 263 (H)     LOW DENSITY LIPOPROTEIN DIRECT (mg/dL)   Date Value   92/76/7976 192 (H)     HIGH DENSITY LIPOPROTEIN (mg/dL)   Date Value   92/76/7976 40     TRIGLYCERIDES (mg/dL)   Date Value   92/76/7976 301 (H)         THYROID  SCREEN TSH REFLEX FT4 (uIU/mL)   Date Value   05/14/2024 2.040         TSH (THYROID  STIM HORMONE) (uIU/mL)   Date Value   12/10/2022 1.190       HEMOGLOBIN A1C (%)   Date Value   07/15/2022 6.5 (H)     HEMOGLOBIN A1C CARE EVERYWHERE (%)   Date Value   12/25/2023 5.8 (H)       No results found for: POCA1C      INR (no units)   Date Value   06/02/2011 1.0 (L)   06/02/2011 < 1.0 (*L)     INR CARE EVERYWHERE (no units)   Date Value   10/16/2021 1.0   09/23/2021 0.9   01/06/2021 1.1   11/11/2020 1.0   07/05/2020 0.9   07/04/2020 0.8 (L)   03/13/2020 1.0       SODIUM (mmol/L)   Date Value   05/14/2024 141       POTASSIUM (mmol/L)   Date Value   05/14/2024 4.6           CREATININE (mg/dL)   Date Value   91/73/7974 0.8       Documented patient preferred pharmacies:    [id number] [place], [place] - [address]  Phone: [phone] Fax: [phone]

## 2024-08-26 NOTE — Telephone Encounter (Signed)
-----   Message from Bricelyn A sent at 08/26/2024  9:24 AM EST -----  Regarding: HOME VISIT PHYSICAL THERAPY  Patient called requesting update on a home visit physical therapy request. Please call asap [phone]

## 2024-08-26 NOTE — Telephone Encounter (Signed)
 This request is for a GLP-1 dose which was previously prescribed on 08/01/2024.     PATIENT STATED THEY WOULD LIKE TO STAY WITH CURRENT DOSE FOR NOW    Please review if the patient should maintain this dose or increase to the next step. If an increase is appropriate, please create a new order and send to the preferred pharmacy.      Thank you!

## 2024-08-28 ENCOUNTER — Telehealth (HOSPITAL_BASED_OUTPATIENT_CLINIC_OR_DEPARTMENT_OTHER): Payer: Self-pay

## 2024-08-28 ENCOUNTER — Ambulatory Visit: Admitting: Internal Medicine

## 2024-08-28 DIAGNOSIS — I509 Heart failure, unspecified: Secondary | ICD-10-CM

## 2024-08-28 MED ORDER — BUMETANIDE 2 MG PO TABS: 2.0000 mg | ORAL_TABLET | Freq: Every day | ORAL | 0 refills | Status: DC

## 2024-08-28 NOTE — Progress Notes (Unsigned)
 To [facility] last night with water around my heart  Signed out AMA as did not want to be admitted for 1.5 mos    [facility]  2 [facility]  Zepbound  will be filled tomorrow    Advised pt in the strongest possible terms to go to [facility] for admission as she states she does not wish to be admitted to a hospital near home.

## 2024-08-28 NOTE — Telephone Encounter (Addendum)
 Patient had TV today with PCP. Bumetanide  refilled.  No further actions needed at this time.  Raquel Manchester, RN 08/28/24 6:46 PM        ----- Message from Bret SQUIBB sent at 08/28/2024 12:36 PM EST -----  Hello,    Patient requesting refills on Topamax  and Bumetanide .     Had recently been admitted and released today.    Refused consultation, unsure if patient is currently still on these medications.    Please reach out at your earliest convenience.    Thank you

## 2024-08-29 NOTE — Addendum Note (Signed)
 Addended by: YVONE GOUGE on: 08/29/2024 02:20 AM     Modules accepted: Level of Service

## 2024-08-30 ENCOUNTER — Telehealth (HOSPITAL_BASED_OUTPATIENT_CLINIC_OR_DEPARTMENT_OTHER): Payer: Self-pay

## 2024-08-30 ENCOUNTER — Other Ambulatory Visit: Payer: Self-pay

## 2024-08-30 ENCOUNTER — Emergency Department (HOSPITAL_BASED_OUTPATIENT_CLINIC_OR_DEPARTMENT_OTHER)

## 2024-08-30 ENCOUNTER — Emergency Department
Admission: EM | Admit: 2024-08-30 | Discharge: 2024-08-30 | Disposition: A | Discharge status: Home / Self Care | Attending: Emergency Medicine | Admitting: Emergency Medicine

## 2024-08-30 DIAGNOSIS — R0602 Shortness of breath: Secondary | ICD-10-CM | POA: Diagnosis not present

## 2024-08-30 DIAGNOSIS — G894 Chronic pain syndrome: Secondary | ICD-10-CM | POA: Insufficient documentation

## 2024-08-30 DIAGNOSIS — F1120 Opioid dependence, uncomplicated: Secondary | ICD-10-CM | POA: Insufficient documentation

## 2024-08-30 DIAGNOSIS — Z9981 Dependence on supplemental oxygen: Secondary | ICD-10-CM

## 2024-08-30 DIAGNOSIS — Z711 Person with feared health complaint in whom no diagnosis is made: Secondary | ICD-10-CM | POA: Insufficient documentation

## 2024-08-30 DIAGNOSIS — E662 Morbid (severe) obesity with alveolar hypoventilation: Secondary | ICD-10-CM | POA: Diagnosis not present

## 2024-08-30 DIAGNOSIS — G4733 Obstructive sleep apnea (adult) (pediatric): Secondary | ICD-10-CM | POA: Diagnosis not present

## 2024-08-30 DIAGNOSIS — R531 Weakness: Secondary | ICD-10-CM | POA: Insufficient documentation

## 2024-08-30 DIAGNOSIS — R0789 Other chest pain: Secondary | ICD-10-CM | POA: Diagnosis not present

## 2024-08-30 LAB — CBC, PLATELET & DIFFERENTIAL
ABSOLUTE BASO COUNT: 0 TH/uL (ref 0.0–0.1)
ABSOLUTE EOSINOPHIL COUNT: 0.1 TH/uL (ref 0.0–0.8)
ABSOLUTE IMM GRAN COUNT: 0.04 TH/uL (ref 0.00–0.10)
ABSOLUTE LYMPH COUNT: 3.6 TH/uL (ref 0.6–5.9)
ABSOLUTE MONO COUNT: 0.6 TH/uL (ref 0.2–1.4)
ABSOLUTE NEUTROPHIL COUNT: 7.3 TH/uL (ref 1.6–8.3)
ABSOLUTE NRBC COUNT: 0 TH/uL (ref 0.0–0.0)
BASOPHIL %: 0.2 % (ref 0.0–1.2)
EOSINOPHIL %: 0.7 % (ref 0.0–7.0)
HEMATOCRIT: 47.5 % — ABNORMAL HIGH (ref 34.1–44.9)
HEMOGLOBIN: 15.4 g/dL (ref 11.2–15.7)
IMMATURE GRANULOCYTE %: 0.3 % (ref 0.0–1.0)
LYMPHOCYTE %: 31.1 % (ref 15.0–54.0)
MEAN CORP HGB CONC: 32.4 g/dL (ref 31.0–37.0)
MEAN CORPUSCULAR HGB: 31.1 pg (ref 26.0–34.0)
MEAN CORPUSCULAR VOL: 96 fl (ref 80.0–100.0)
MEAN PLATELET VOLUME: 11.3 fL (ref 8.7–12.5)
MONOCYTE %: 4.7 % (ref 4.0–13.0)
NEUTROPHIL %: 63 % (ref 40.0–75.0)
NRBC %: 0 % (ref 0.0–0.0)
PLATELET COUNT: 290 TH/uL (ref 150–400)
RBC DISTRIBUTION WIDTH STD DEV: 48.5 fL — ABNORMAL HIGH (ref 35.1–46.3)
RED BLOOD CELL COUNT: 4.95 M/uL (ref 3.90–5.20)
WHITE BLOOD CELL COUNT: 11.6 TH/uL — ABNORMAL HIGH (ref 4.0–11.0)

## 2024-08-30 LAB — BASIC METABOLIC PANEL
ANION GAP: 11 mmol/L (ref 10–22)
BUN (UREA NITROGEN): 14 mg/dL (ref 7–18)
CALCIUM: 10.6 mg/dL — ABNORMAL HIGH (ref 8.5–10.5)
CARBON DIOXIDE: 35 mmol/L — ABNORMAL HIGH (ref 21–32)
CHLORIDE: 95 mmol/L — ABNORMAL LOW (ref 98–107)
CREATININE: 1 mg/dL (ref 0.4–1.2)
ESTIMATED GLOMERULAR FILT RATE: >60 mL/min (ref >60)
Glucose Random: 97 mg/dL (ref 74–160)
POTASSIUM: 4.1 mmol/L (ref 3.5–5.1)
SODIUM: 141 mmol/L (ref 136–145)

## 2024-08-30 LAB — HOLD BLUE TOP TUBE

## 2024-08-30 LAB — TROPONIN T HS 1 HOUR
DELTA 1 HOUR TROPONIN T HS: 1 ng/L (ref 0–4)
TROPONIN T HS 1 HOUR RESULT: 8 ng/L (ref 0–10)

## 2024-08-30 LAB — TROPONIN T HS BASELINE: TROPONIN T HS BASELINE: 9 ng/L (ref 0–10)

## 2024-08-30 LAB — NT-PROBNP: NT-proBNP: <36 pg/mL (ref 0–125)

## 2024-08-30 LAB — EKG

## 2024-08-30 NOTE — Narrator Note (Signed)
 Waiting on transport for patient

## 2024-08-30 NOTE — ED Provider Notes (Signed)
 Patient's mode of arrival was by Self.  Arrival time:     Chief complaint: Fatigue and Nausea          Triage Vital Signs:  ED Triage Vitals [08/30/24 0848]   ED Triage Vitals Brief Group      Temp 96.6 F      Pulse 88      Resp 18      BP 140/87      SpO2 95 %      Pain Score 0        Triage Documentation       Defabrizio, Geraline, RN 08/30/2024 08:52                 Pt presenting to ED in wheelchair c/o weakness and nausea, recently seen at [facility] where they wanted to admit pt due to fluid in lungs and around heart. Pt left and wanted to be seen at [facility]. Pt reports wheelchair bound but able to stand and pivot. Denies any pain/SOB, reports only having chest pressure. Home meds taken as scheduled and OTC tylenol  taken today.            HPI:  53 year old female patient presents for evaluation of shortness of breath    Patient with a history of bipolar disorder, schizoaffective disorder.  Spinal stenosis with claudication, wheelchair-bound.  Opioid-induced constipation.  Obesity    CHF with preserved EF on Bumex     Medicines include Zyprexa , Topamax , Minipress , Suboxone , gabapentin , Klonopin .    Reportedly has been feeling unwell for about a week.  States that she went to hospital in [place] and they recommended admission but she declined because she wanted to be closer to home  Has been using her Bumex  daily.  Did take it today  Legs are baseline swollen.  Sleeps with 2 pillows and uses nighttime oxygen , not any different than prior    Spoke to her PCP a few days ago who requested she come into [facility] to get checked out.          Past Medical History:  Past Medical History:  schizo: Bipolar affective ([name])  No date: Chronic systolic CHF (congestive heart failure) ([name])  No date: Depression  12/23/2021: Fall in home  12/23/2021: Gait instability  12/23/2021: Requires continuous at home supplemental oxygen   depress: Schizo affective schizophrenia ([name])  12/23/2021: Spinal stenosis of lumbar 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    claudication  No date: Substance abuse ([name])    Immunization History:  Immunization History   Administered Date(s) Administered    Covid-19 Vaccine (Moderna - Full Dose) 02/03/2020, 04/15/2020    Hep A Pedi/Adolescent 2 Doses AGE 71/16/2016    INFLUENZA VIRUS TRI W/PRESV VACCINE 18/> YRS IM (PRIVATE) 06/02/2011, 08/09/2022    Influenza Trivalent Preservative Free 0.5 mL 49m+ 06/14/2023    Influenza Vac Quad (Aiiv4) inact, Adjuv Pre Free 0.17ml 06/27/2015    Influenza Vaccine High Dose 0.60ml 65 And Older 06/02/2011, 06/27/2015, 01/25/2021, 06/03/2021, 08/09/2022    Influenza Virus Quad Presv Free Vacc 6 Mo and Older, IM 01/25/2021, 06/03/2021    Influenza, Unspecified Formulation 05/20/2010, 06/09/2010, 07/15/2011    Moderna >12yo Covid-19 (Spikevax) 06/14/2023, 06/05/2024    PNEUMOCOCCAL POLYSACCHARIDE VACCINE v23 05/20/2010    Pfizer > 12 yo Covid-19 Vaccine 11/22/2022    Pneumococcal 20-(prevnar 20) 11/11/2022    Td 05/12/2005    Tdap 11/03/2021, 07/15/2022    influenza, recombinant, injectable, preservative free 06/05/2024       Past Surgical  History:  No past surgical history on file.    Medications:    No current facility-administered medications for this encounter.     Current Outpatient Medications   Medication Sig    bumetanide  (BUMEX ) 2 MG tablet Take 1 tablet by mouth daily    busPIRone  (BUSPAR ) 15 MG tablet Take 1 tablet by mouth 3 (three) times daily    levothyroxine  (SYNTHROID ) 25 MCG tablet Take 1 tablet by mouth every morning before breakfast    tirzepatide -Weight Management (ZEPBOUND ) 7.5 MG/0.5ML sc auto-injector Inject 7.5 mg under the skin once a week    FLUoxetine  (PROZAC ) 40 MG capsule Take 1 capsule by mouth daily    magnesium  gluconate (MAGONATE) 500 MG tablet Take 1 tablet by mouth in the morning and 1 tablet before bedtime.    buprenorphine -naloxone  (SUBOXONE ) 8-2 MG sublingual film Place two films underneath the tongue twice daily    baclofen  (LIORESAL ) 10 MG tablet Take 1 tablet by mouth 3  (three) times daily    miconazole  (MICOTIN) 2 % powder Apply topically as needed for Itching    gabapentin  (NEURONTIN ) 800 MG tablet Take 1 tablet by mouth 3 (three) times daily    clonazePAM  (KLONOPIN ) 1 MG tablet TAKE 1 TABLET BY MOUTH 2 TIMES DAILY AS NEEDED FOR ANXIETY.    QUEtiapine  (SEROQUEL ) 100 MG tablet Take 1 tablet by mouth at bedtime    linaCLOtide  (LINZESS ) 145 MCG CAPS Take 1 capsule by mouth daily    topiramate  (TOPAMAX ) 50 MG tablet Take 1 tablet by mouth in the morning and 1 tablet before bedtime. Do all this for 21 days.    prazosin  (MINIPRESS ) 2 MG capsule Take 1 capsule by mouth nightly  for 21 days    OLANZapine  (ZYPREXA ) 5 MG tablet Take 2 tablets by mouth daily  for 21 days    acetaminophen  (TYLENOL ) 500 MG tablet Take 2 tablets by mouth every 8 (eight) hours as needed for Pain    cetirizine  (ZYRTEC ) 10 MG tablet Take 1 tablet by mouth daily    OTHER MEDICATION Wheelchair: Motorized  For use as directed.    DX: Wheelchair dependent  PRI89:S00.6    mirtazapine  (REMERON ) 15 MG tablet Take 1 tablet by mouth nightly    cholecalciferol  (VITAMIN D3) 25 MCG (1000 UT) tablet Take 1 tablet by mouth daily    fluticasone  (FLONASE ) 50 MCG/ACT nasal spray 2 sprays by Each Nostril route daily    Melatonin 5 MG CAPS Take 10 mg by mouth at bedtime    atorvastatin  (LIPITOR) 80 MG tablet Take 1 tablet by mouth daily    budesonide -formoterol  (SYMBICORT ,BREYNA ) 160-4.5 MCG/ACT inhaler Inhale 2 puffs into the lungs in the morning and 2 puffs before bedtime.    Multiple Vitamin (MULTIVITAMIN) tablet Take 1 tablet by mouth daily    nicotine  polacrilex (NICORETTE ) 4 MG gum Take 1 each by mouth as needed for Craving (Nicotine )    omeprazole  (PRILOSEC) 40 MG capsule Take 1 capsule by mouth daily    polyethylene glycol (GLYCOLAX /MIRALAX ) 17 g packet Take 1 packet by mouth daily    spironolactone  (ALDACTONE ) 100 MG tablet Take 1 tablet by mouth daily    sucralfate  (CARAFATE ) 1 GM tablet Take 1 tablet by mouth in the  morning and 1 tablet at noon and 1 tablet in the evening and 1 tablet before bedtime.    OTHER MEDICATION Electric wheelchair  For use as directed.    DX: Neuropathy  ICD10:G60    Please send to [facility] Fax # [phone]       Social History:  Social History    Tobacco Use      Smoking status: Every Day        Packs/day: 3.00        Years: 3.0 packs/day for 30.0 years (90.0 ttl pk-yrs)        Types: Cigarettes        Passive exposure: Current      Smokeless tobacco: Never      Tobacco comments: Pt currently on nicotine  patch and gum    Alcohol  use: Yes      Comment: 2 liters of captain morgan daily      Family History:  Review of patient's family history indicates:  Problem: COPD      Relation: Mother          Age of Onset: (Not Specified)  Problem: Cancer - Lung      Relation: Father          Age of Onset: (Not Specified)          Comment: + smoking history  Problem: Cancer - Lung      Relation: Sister          Age of Onset: 62          Comment: + smoking history  Problem: Diabetes      Relation: Maternal Grandmother          Age of Onset: (Not Specified)  Problem: Cancer - Colon      Relation: Maternal Grandmother          Age of Onset: 3      Allergies:  Review of Patient's Allergies indicates:   Bee venom               Anaphylaxis   Metolazone               Other (See Comments)    Comment:Hypokalemia   Trazodone                Hives   Methylprednisolone      Dizziness, Drowsiness    Comment:Also believes syncope. Seems to tolerate             inhalational and epidural steroid.   Nutritional supplem*       Tegretol [carbamaze*    Hives   Hydroxyzine              Nausea Only    Physical Exam:   GENERAL: Obese, chronically ill-appearing  HEAD: Atraumatic  EYES: No evidence of trauma. Sclerae anicteric.  Pupils equal.  ENT: Mucous Membranes Moist.   LUNGS: Clear to auscultation bilaterally.  No significant wheezing.  Respirations unlabored.  HEART: Regular rate & rhythm.   EXTREMITIES/MSK: No notable  deformities or trauma.  Warm and well perfused.  SKIN: Warm and dry, no rash  NEUROLOGIC: Alert and oriented x3; moves all extremities well; speaking in clear fluent sentences. Sensation intact to light touch throughout.  PSYCHIATRIC: Flat affect but jovial at times, cooperative.  LYMPH: Mild peripheral edema    Labs:  Results for orders placed or performed during the hospital encounter of 08/30/24 (from the past 24 hours)   CBC, Platelet & Differential    Collection Time: 08/30/24  9:31 AM   Result Value    WHITE BLOOD CELL COUNT 11.6 (H)    RED BLOOD CELL COUNT 4.95    HEMOGLOBIN 15.4    HEMATOCRIT 47.5 (H)    MEAN CORPUSCULAR VOL  96.0    MEAN CORPUSCULAR HGB 31.1    MEAN CORP HGB CONC 32.4    RBC DISTRIBUTION WIDTH STD DEV 48.5 (H)    PLATELET COUNT 290    MEAN PLATELET VOLUME 11.3    NEUTROPHIL % 63.0    IMMATURE GRANULOCYTE % 0.3    LYMPHOCYTE % 31.1    MONOCYTE % 4.7    EOSINOPHIL % 0.7    BASOPHIL % 0.2    NRBC % 0.0    ABSOLUTE NEUTROPHIL COUNT 7.3    ABSOLUTE IMM GRAN COUNT 0.04    ABSOLUTE LYMPH COUNT 3.6    ABSOLUTE MONO COUNT 0.6    ABSOLUTE EOSINOPHIL COUNT 0.1    ABSOLUTE BASO COUNT 0.0    ABSOLUTE NRBC COUNT 0.0   Basic Metabolic Panel    Collection Time: 08/30/24  9:31 AM   Result Value    SODIUM 141    POTASSIUM 4.1    CHLORIDE 95 (L)    CARBON DIOXIDE 35 (H)    ANION GAP 11    CALCIUM  10.6 (H)    Glucose Random 97    BUN (UREA NITROGEN) 14    CREATININE 1.0    ESTIMATED GLOMERULAR FILT RATE > 60   Troponin T HS Baseline    Collection Time: 08/30/24  9:31 AM   Result Value    TROPONIN T HS BASELINE 9   NT-proBNP    Collection Time: 08/30/24  9:31 AM   Result Value    NT-proBNP < 36   Hold Blue Top Tube    Collection Time: 08/30/24  9:31 AM   Result Value    HOLD BLUE TOP TUBE RECEIVED IN HEMATOL   Troponin T HS 1 Hour    Collection Time: 08/30/24 10:33 AM   Result Value    TROPONIN T HS 1 HOUR RESULT 8    DELTA 1 HOUR TROPONIN T HS 1       Vital Signs During ED Stay:   08/30/24  0848 08/30/24  1018    BP: 140/87 110/85   Pulse: 88 86   Resp: 18 13   Temp: 96.6 F    SpO2: 95% 96%   Weight: (!) 145.2 kg (320 lb)        Meds Given during ED visit:  Medications - No data to display    Imaging Results & Interpretation:  XR Chest Portable   Final Result    TECHNIQUE: Portable chest, 10:09 a.m.        Indication: shortness of breath         Comparison: 05/29/2023 and 04/07/2022        FINDINGS:        Quality: Satisfactory.          Tubes/lines: None.        Lungs: The lungs are clear.         Pleura: There is no pleural effusion or pneumothorax.        Heart: The cardiac silhouette is unremarkable.         Mediastinum/hila: Unremarkable.        Bones and Soft Tissues: There is thoracic dextroscoliosis. There is an old     right humeral head/neck deformity. A small round metallic object is     redemonstrated overlying the lower neck/spine in the midline.         IMPRESSION:        No acute cardiopulmonary findings on portable chest radiograph.  Reviewed and Electronically Signed By: Elia Frost, MD    Signed Date and Time: 08/30/2024 10:11 AM           My EKG Interpretation: Sinus at 85.  Normal axis.  Normal intervals.  No ST elevations or depressions    ED Course, Medical Decision-making, Disposition:  53 year old female patient presents for concerns of volume overload.  She has a history of CHF.  Trace lower extremity edema. vital signs are appropriate on room air unless she falls asleep, in which case she has mild hypoxia reports that she uses 2 L nasal cannula at night at baseline    Difficult IV access,    US  Guided Peripheral IV, performed by me:   Indication: poor vascular access, multiple attempts RN  Site: RUE  Needle: 20g  Skin was cleaned with chlorhexidine.  Usual sterile technique used.  Needle advanced under US  guidance with tip visualized to lumen and target sign acquired, good blood flow, labs obtained, flushes easily without discomfort.   Complication: none, tolerated well    EKG without  ischemic concerns  Chest x-ray without any pulmonary edema, independently reviewed by myself as well    Lab testing including troponin testing and NT proBNP are very reassuring  Bedside echo with very limited views secondary to habitus but no significant pericardial effusion was seen    I attempted to call her PCP but she is not working at this time  No indication for admission seen  Message left with PCP's office.      Patient was given standardized discharge instructions and return precautions.          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 Please excuse any typographical errors that have not yet been reviewed and corrected.

## 2024-08-30 NOTE — ED Triage Note (Signed)
 Pt presenting to ED in wheelchair c/o weakness and nausea, recently seen at [facility] where they wanted to admit pt due to fluid in lungs and around heart. Pt left and wanted to be seen at [facility]. Pt reports wheelchair bound but able to stand and pivot. Denies any pain/SOB, reports only having chest pressure. Home meds taken as scheduled and OTC tylenol  taken today.

## 2024-08-30 NOTE — Telephone Encounter (Signed)
 Cheryl Zimmerman,  [id number],  53 year old,  female  Telephone Information:   Home Phone [phone]   Work Phone Not on file.   Mobile [phone]     Patient's PCP: [name], MD    Patient's language of care:   English    Person calling on behalf of patient: who is calling: Dr. Dorn Mould  [facility]    Calls today to speak to nurse or PCP regarding patient above for any update    CALL BACK NUMBER: [phone]

## 2024-08-30 NOTE — Narrator Note (Signed)
 Pt desat to 88% on RA, placed on 2L NC satin 95%. Pt reports wearing 2L NC at home at night.

## 2024-08-30 NOTE — Discharge Instructions (Addendum)
 Your lab testing and x-ray today were reassuring.  There was no indication of fluid in your lungs or around your heart    Your electrolytes and blood counts were reassuring    Please continue usual medicines.

## 2024-08-30 NOTE — Narrator Note (Signed)
 Chair car called for patient.   Patient aware 45-60 min eta given

## 2024-08-30 NOTE — Narrator Note (Signed)
 Patient Disposition  Patient education for diagnosis, medications, activity, diet and follow-up.  Patient left ED 11:48 AM.  Patient rep received written instructions.    Interpreter to provide instructions: No    Patient belongings with patient: YES    Have all existing LDAs been addressed? Yes    Have all IV infusions been stopped? N/A    Destination: Discharged to home. Patient verbalizes understanding of all discharge instructions. Paperwork in hand.

## 2024-08-30 NOTE — Narrator Note (Signed)
 Unable to gain PIV access, pt reported usually needs US  guided IV. IV team paged no response, MD notified.

## 2024-08-30 NOTE — Narrator Note (Signed)
 Ambulance called additional 1 hour ETA given. Patient updated

## 2024-09-02 ENCOUNTER — Ambulatory Visit: Admitting: Physician Assistant

## 2024-09-02 ENCOUNTER — Encounter (HOSPITAL_BASED_OUTPATIENT_CLINIC_OR_DEPARTMENT_OTHER): Payer: Self-pay

## 2024-09-02 DIAGNOSIS — G894 Chronic pain syndrome: Secondary | ICD-10-CM

## 2024-09-02 DIAGNOSIS — Z993 Dependence on wheelchair: Secondary | ICD-10-CM

## 2024-09-02 DIAGNOSIS — M2550 Pain in unspecified joint: Secondary | ICD-10-CM

## 2024-09-02 NOTE — Progress Notes (Signed)
 Cheryl Zimmerman is a 53 year old female that presents today for: Referral       S:    Patient who is is wheelchair and homebound.  Patient is requesting at home physical therapy.  Feels like her lower legs are sore and she needs help managing joint pain.  She shares the name of Jenkins Rummer from [facility] [phone] that she was told that we could reach out to this person from [facility] to help set up home physical therapy    Review of Systems   Constitutional: Negative. Negative for fever.   Cardiovascular: Negative.    Respiratory: Negative.     Gastrointestinal:  Negative for nausea and vomiting.   Neurological: Negative.  Negative for dizziness and headaches.         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     (HFpEF) heart failure with preserved ejection fraction ([name])     MDD (major depressive disorder), recurrent, severe, with psychosis ([name])     Requires continuous at home supplemental oxygen      Spinal stenosis of lumbar 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 claudication     Fall in home     Gait instability     Hypokalemia     Lung nodule (12/30/2022)     Stable angina ([name])     Chest pain, atypical     Obesity hypoventilation syndrome ([name])     Osteoarthritis of spine with radiculopathy, lumbar region     Chronic bilateral low back pain with bilateral sciatica     Hypoxia     Alcohol  withdrawal syndrome, uncomplicated ([name])     Cocaine  use     Homicidal ideation     Somnolence     Obstructive sleep apnea syndrome     Acute metabolic  encephalopathy     B12 deficiency     Vitamin D  deficiency     H/O total hysterectomy     Thyroid  condition        Current Outpatient Medications:     bumetanide  (BUMEX ) 2 MG tablet, Take 1 tablet by mouth daily, Disp: 30 tablet, Rfl: 0    busPIRone  (BUSPAR ) 15 MG tablet, Take 1 tablet by mouth 3 (three) times daily, Disp: 90 tablet, Rfl: 1    levothyroxine  (SYNTHROID ) 25 MCG tablet, Take 1 tablet by mouth every morning before breakfast, Disp: 90 tablet, Rfl: 0    tirzepatide -Weight Management (ZEPBOUND ) 7.5 MG/0.5ML sc auto-injector, Inject 7.5 mg under the skin once a week, Disp: 2 mL, Rfl: 0    FLUoxetine  (PROZAC ) 40 MG capsule, Take 1 capsule by mouth daily, Disp: 90 capsule, Rfl: 0    magnesium  gluconate (MAGONATE) 500 MG tablet, Take 1 tablet by mouth in the morning and 1 tablet before bedtime., Disp: 60 tablet, Rfl: 5    buprenorphine -naloxone  (SUBOXONE ) 8-2 MG sublingual film, Place two films underneath  the tongue twice daily, Disp: 120 Film, Rfl: 0    baclofen  (LIORESAL ) 10 MG tablet, Take 1 tablet by mouth 3 (three) times daily, Disp: 90 tablet, Rfl: 2    miconazole  (MICOTIN) 2 % powder, Apply topically as needed for Itching, Disp: 85 g, Rfl: 2    gabapentin  (NEURONTIN ) 800 MG tablet, Take 1 tablet by mouth 3 (three) times daily, Disp: 90 tablet, Rfl: 2    clonazePAM  (KLONOPIN ) 1 MG tablet, TAKE 1 TABLET BY MOUTH 2 TIMES DAILY AS NEEDED FOR ANXIETY., Disp: 60 tablet, Rfl: 2    QUEtiapine  (SEROQUEL ) 100 MG tablet, Take 1 tablet by mouth at bedtime, Disp: 30 tablet, Rfl: 2    linaCLOtide  (LINZESS ) 145 MCG CAPS, Take 1 capsule by mouth daily, Disp: 30 capsule, Rfl: 1    topiramate  (TOPAMAX ) 50 MG tablet, Take 1 tablet by mouth in the morning and 1 tablet before bedtime. Do all this for 21 days., Disp: 42 tablet, Rfl: 0    prazosin  (MINIPRESS ) 2 MG capsule, Take 1 capsule by mouth nightly  for 21 days, Disp: 21 capsule, Rfl: 0    OLANZapine  (ZYPREXA ) 5 MG tablet, Take 2 tablets by mouth daily  for 21 days,  Disp: 42 tablet, Rfl: 0    acetaminophen  (TYLENOL ) 500 MG tablet, Take 2 tablets by mouth every 8 (eight) hours as needed for Pain, Disp: 180 tablet, Rfl: 5    cetirizine  (ZYRTEC ) 10 MG tablet, Take 1 tablet by mouth daily, Disp: 90 tablet, Rfl: 3    OTHER MEDICATION, Wheelchair: Motorized For use as directed.  DX: Wheelchair dependent PRI89:S00.6, Disp: 1 each, Rfl: 0    mirtazapine  (REMERON ) 15 MG tablet, Take 1 tablet by mouth nightly, Disp: , Rfl:     cholecalciferol  (VITAMIN D3) 25 MCG (1000 UT) tablet, Take 1 tablet by mouth daily, Disp: 90 tablet, Rfl: 3    fluticasone  (FLONASE ) 50 MCG/ACT nasal spray, 2 sprays by Each Nostril route daily, Disp: , Rfl:     Melatonin 5 MG CAPS, Take 10 mg by mouth at bedtime, Disp: , Rfl:     atorvastatin  (LIPITOR) 80 MG tablet, Take 1 tablet by mouth daily, Disp: 90 tablet, Rfl: 3    budesonide -formoterol  (SYMBICORT ,BREYNA ) 160-4.5 MCG/ACT inhaler, Inhale 2 puffs into the lungs in the morning and 2 puffs before bedtime., Disp: 6 g, Rfl: 2    Multiple Vitamin (MULTIVITAMIN) tablet, Take 1 tablet by mouth daily, Disp: 90 tablet, Rfl: 3    nicotine  polacrilex (NICORETTE ) 4 MG gum, Take 1 each by mouth as needed for Craving (Nicotine ), Disp: 100 tablet, Rfl: 11    omeprazole  (PRILOSEC) 40 MG capsule, Take 1 capsule by mouth daily, Disp: 90 capsule, Rfl: 3    polyethylene glycol (GLYCOLAX /MIRALAX ) 17 g packet, Take 1 packet by mouth daily, Disp: 90 packet, Rfl: 3    spironolactone  (ALDACTONE ) 100 MG tablet, Take 1 tablet by mouth daily, Disp: 90 tablet, Rfl: 3    sucralfate  (CARAFATE ) 1 GM tablet, Take 1 tablet by mouth in the morning and 1 tablet at noon and 1 tablet in the evening and 1 tablet before bedtime., Disp: 360 tablet, Rfl: 3    OTHER MEDICATION, Electric wheelchair For use as directed.  DX: Neuropathy ICD10:G60  Please send to [facility] Fax # [phone], Disp: 1 each, Rfl: 0    Review of Patient's Allergies indicates:   Bee venom               Anaphylaxis  Metolazone               Other (See Comments)    Comment:Hypokalemia   Trazodone                Hives   Methylprednisolone      Dizziness, Drowsiness    Comment:Also believes syncope. Seems to tolerate             inhalational and epidural steroid.   Nutritional supplem*       Tegretol [carbamaze*    Hives   Hydroxyzine              Nausea Only    O:    Most Recent BP Reading(s)  08/30/24 : 110/85  07/05/24 : 135/80  05/14/24 : (!) 140/82  01/17/24 : 139/84  11/29/23 : (!) 143/81          Most Recent Weight Reading(s)  08/30/24 : (!) 145.2 kg (320 lb)  07/05/24 : (!) 143.3 kg (316 lb)  05/29/23 : (!) 141.1 kg (311 lb 1.1 oz)  02/01/23 : (!) 146 kg (321 lb 14 oz)  12/31/22 : (!) 155.2 kg (342 lb 1.6 oz)      Physical Exam  Patient speech is normal and able to speak in clear sentences.        A/P:     (M25.50) Arthralgia, unspecified joint  (primary encounter diagnosis)  (Z99.3) Wheelchair dependent  (E66.01) Morbid obesity ([name])  (G89.4) Chronic pain disorder  Comment: Patient who is homebound is interested in physical therapy.  Provided contact for [facility] wrap who can help facilitate home PT.  Plan: Will request RN to help coordinate for home PT referral      We discussed the patient's medical issues and the importance of medication compliance. The patient was ready to learn and no apparent learning barriers were identified. I explained the diagnosis and treatment plan, and the patient expressed understanding of the content. Possible side effects of the prescribed medication(s) were explained. I attempted to answer any questions regarding the diagnosis and the proposed treatment.    This note was prepared using voice recognition software. Please disregard any transcription errors.         Cobain Morici, PA-C

## 2024-09-02 NOTE — Telephone Encounter (Signed)
 Person calling on behalf of patient: who is calling: Dr. Dorn Mould  [facility]  Calls today to speak to nurse or PCP regarding patient above for any update  CALL BACK NUMBER: [phone]       Failed attempt to contact caller. [place] [facility] called, line rang busy. RN will attempt another outreach today.     Dev Dhondt Colon-Guerrero, RN, 09/02/2024

## 2024-09-02 NOTE — Telephone Encounter (Addendum)
 ED visit date: 08/30/24    ED diagnosis:  Weakness//Chronic Pain D/O      ED summary:  53 year old female patient presents for evaluation of shortness of breath     Patient with a history of bipolar disorder, schizoaffective disorder.  Spinal stenosis with claudication, wheelchair-bound.  Opioid-induced constipation.  Obesity     CHF with preserved EF on Bumex      Medicines include Zyprexa , Topamax , Minipress , Suboxone , gabapentin , Klonopin .     Reportedly has been feeling unwell for about a week.  States that she went to hospital in [place] and they recommended admission but she declined because she wanted to be closer to home  Has been using her Bumex  daily.  Did take it today  Legs are baseline swollen.  Sleeps with 2 pillows and uses nighttime oxygen , not any different than prior     Spoke to her PCP a few days ago who requested she come into [facility] to get checked out.      Issues for follow up after ED:  N/A     Patient reports the following regarding their current condition (stable, worse, better):  Stable                       List any new meds: N/A      Pain/discomfort assessment: Chronic     Follow up plan:   Scheduled appointment(s) with PCP and or relevant specialist(s): Already Seen PCP TV F/U 09/02/24    Patient already on Agonist Therapy for Opioid D/O (Suboxone  and has Radio Broadcast Assistant)    Other plan of care considerations: N/A     Pt verbalized good understanding and agrees with plan as discussed. Joen PARAS. Lavigne-Cordeiro, LPN

## 2024-09-02 NOTE — Telephone Encounter (Signed)
 Person calling on behalf of patient: who is calling: Dr. Dorn Mould  [facility]  Calls today to speak to nurse or PCP regarding patient above for any update  CALL BACK NUMBER: [phone]       Failed attempt to contact caller. [facility] called x 3, rang busy each time. RN will attempt another outreach today.     Cheryl Heinbaugh Colon-Guerrero, RN, 09/02/2024

## 2024-09-02 NOTE — Telephone Encounter (Signed)
-----   Message from [facility] sent at 09/02/2024  2:30 PM EST -----  Regarding: Referral for home PT  Spoke to patient and she gave me information for this person from [facility], Jenkins Rummer said that we should reach out to her to find out what information is needed to initiate patient having home PT.  Can you please reach out to and at this number and see what is needed from our office in order to get patient home PT.    Jenkins Rummer from [facility] [phone]      Please and thank you!  Meghan

## 2024-09-03 NOTE — Telephone Encounter (Signed)
 Per EPIC review, referral entered for home PT.     -referral faxed to [facility] 05/27/24; denied 9/8/25insurance not contracted  -faxed to [facility] 05/31/24; denied 06/14/24 not able to provide services pt needs  -[facility] called recommending [facility]; referral sent 06/07/24 then resent 06/11/24  -[facility] denied referral 06/14/24 no RN at [place] at this time  -team RN LVM for [facility], Rocky Saltness, 07/29/24  Refugio Saltness returned call 08/01/24; all pt needs is home PT, no callback needed.    -TC to [name] [phone] is to [employer] [facility], LVM to return call to RN  -TC to [place], [facility] CP [phone], to return call to RN, [phone]    DISPO: awaiting call back from [facility] for care coordination and does [facility] have a vendor to preferred VNA to refer pt to? Three different companies have denied pt. Awaiting call back   Teodoro Jeffreys Colon-Guerrero, RN, 09/03/2024

## 2024-09-03 NOTE — Telephone Encounter (Signed)
 Pt has televisit yesterday. Will follow up today r/t VNA referral.

## 2024-09-04 NOTE — Telephone Encounter (Signed)
-  TC to [place], [facility] CP [phone], to return call to RN, [phone]    DECLINED WITH A COUPLE OF VENDORS AT THIS TIME,   [facility] plan and area code doesn't need re    Per EPIC review, referral entered for home PT.     -referral faxed to [facility] 05/27/24; denied 9/8/25insurance not contracted  -faxed to [facility]9/12/25; denied 06/14/24 not able to provide services pt needs  -[facility] called recommending [facility]; referral sent 06/07/24 then resent 06/11/24  -[facility] denied referral 06/14/24 no RN at [place] at this time  -team RN LVM for [facility], Rocky Saltness, 07/29/24  Refugio Saltness returned call 08/01/24; all pt needs is home PT, no callback needed.    -TC to [name] [phone] is to [employer], LVM to return call to RN  -TC to [place], [facility] CP [phone], to return call to RN, [phone]    DISPO: awaiting call back from [facility] for care coordination and does [facility] have a vendor to preferred VNA to refer pt to? Three different companies have denied pt. Awaiting call back   Carey Johndrow Colon-Guerrero, RN, 09/04/2024  [facility] [phone]   Benef fed reg mcr mcd pers care amd hc in 2026. Nivia  group adult foster care diff fun. She needs to reach out. I gave her name long term service services. Pt has to reach out to them for services. Give contact to schedule medminder. Gaps in ca pARTNERS. So,e familarity. Send referr care in the home.

## 2024-09-05 ENCOUNTER — Encounter (HOSPITAL_BASED_OUTPATIENT_CLINIC_OR_DEPARTMENT_OTHER): Payer: Self-pay

## 2024-09-05 NOTE — Progress Notes (Signed)
 Cheryl Zimmerman authorized [employer] to release/disclose Medical records to [facility]/Communicate with [facility] .  The medical release form was sent to the medical records department to be processed. Via fax - Fax number: [phone] Attn: HIM    Cheryl Zimmerman, 09/05/2024  .

## 2024-09-06 ENCOUNTER — Telehealth (HOSPITAL_BASED_OUTPATIENT_CLINIC_OR_DEPARTMENT_OTHER): Payer: Self-pay

## 2024-09-06 NOTE — Telephone Encounter (Signed)
 RE: referral entered for home PT.     UPDATE from 09/04/24    Incoming call received from [name], [facility] CP [phone]. Advised of difficulty in finding VNA; last 3 referrals sent were declined. Assistance requested in finding a vendor. You can try [facility]. She was with them before, [phone]. Also, we are working with her because her benefits change in January d/t federal regulation with MCR/MCD personal care and homecare in 2026. She'll be transferred to group adult foster care, a different fund. She needs to reach out to them. I gave her name for long term services but pt has to reach out to them for services. I will continue to remind her.     Incoming call received from [name] [phone], [employer] [facility] [employer]. Advised # was provided by pt as a VNA company. We are awaiting her home PT. Once she's had the initial assessment, we can then go in to assess her for an electric wheelchair. Advised continuing to find a vendor. Once one accepts pt, call will be returned to coordinate care.     Cheryl Fager Colon-Guerrero, RN

## 2024-09-06 NOTE — Telephone Encounter (Signed)
 Cheryl Zimmerman,  [id number],  53 year old,  female  Telephone Information:   Home Phone [phone]   Work Phone Not on file.   Mobile [phone]     Patient's PCP: [name], MD    Patient's language of care:   English    Person calling on behalf of patient: who is calling: Jarod from [facility] Respite program referral by [facility]     Calls today requesting update med list will fax [facility]    [name] NUMBER: [phone]-Fax [phone]

## 2024-09-09 ENCOUNTER — Telehealth (HOSPITAL_BASED_OUTPATIENT_CLINIC_OR_DEPARTMENT_OTHER): Payer: Self-pay

## 2024-09-09 NOTE — Telephone Encounter (Signed)
 A user error has taken place: encounter opened in error, closed for administrative reasons.

## 2024-09-09 NOTE — Telephone Encounter (Signed)
 Person calling on behalf of patient: who is calling: Jarod from [facility] Respite program referral by [facility]      Calls today requesting update med list will fax [facility]     [name] NUMBER: [phone]-Fax [phone]       -------------------------------------------------------------------------------    Med list already faxed over by admin  No further action needed from this nurse at this time  See media manager      Forbes Pears, RN, 09/09/2024

## 2024-09-21 ENCOUNTER — Telehealth (HOSPITAL_BASED_OUTPATIENT_CLINIC_OR_DEPARTMENT_OTHER): Payer: Self-pay

## 2024-09-21 NOTE — Telephone Encounter (Signed)
 ON CALL MD NOTE    Caller:  [facility] called    Patient of:  Dr. Yvone    Concern: Cannot accept pt for hoemcare. Diagnosis cannot be pain, has to be cause of or primary dx    Plan: I did give the VNA the information from 12/12 for CHF and weakness  They may be able to take that, but need more info- F2F encounter       Route to PCP for f/u and last provider that saw her (12/15 televisit)     Ayliana Casciano R. Veleria, MD  09/21/24 1:38 PM

## 2024-09-24 ENCOUNTER — Other Ambulatory Visit (HOSPITAL_BASED_OUTPATIENT_CLINIC_OR_DEPARTMENT_OTHER): Payer: Self-pay

## 2024-09-24 ENCOUNTER — Ambulatory Visit (HOSPITAL_BASED_OUTPATIENT_CLINIC_OR_DEPARTMENT_OTHER): Payer: Self-pay

## 2024-09-24 MED ORDER — TIRZEPATIDE-WEIGHT MANAGEMENT 7.5 MG/0.5ML SC SOAJ: 7.5000 mg | SUBCUTANEOUS | 0 refills | Status: DC

## 2024-09-24 NOTE — Telephone Encounter (Signed)
 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     (HFpEF) heart failure with preserved ejection fraction ([name])     MDD (major depressive disorder), recurrent, severe, with psychosis ([name])     Requires continuous at home supplemental oxygen      Spinal stenosis of lumbar 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 claudication     Fall in home     Gait instability     Hypokalemia     Lung nodule (12/30/2022)     Stable angina ([name])     Chest pain, atypical     Obesity hypoventilation syndrome ([name])     Osteoarthritis of spine with radiculopathy, lumbar region     Chronic bilateral low back pain with bilateral sciatica     Hypoxia     Alcohol  withdrawal syndrome, uncomplicated ([name])     Cocaine  use     Homicidal ideation     Somnolence     Obstructive sleep apnea syndrome     Acute metabolic encephalopathy     B12 deficiency     Vitamin D  deficiency     H/O total hysterectomy     Thyroid  condition      Review of Patient's Allergies indicates:   Bee venom               Anaphylaxis   Metolazone               Other (See Comments)    Comment:Hypokalemia   Trazodone                Hives   Methylprednisolone      Dizziness, Drowsiness    Comment:Also believes syncope. Seems to tolerate             inhalational and epidural steroid.   Nutritional supplem*       Tegretol [carbamaze*    Hives   Hydroxyzine              Nausea Only    Emergency care:      Loss of Consciousness:  No     Chest Pain:  No     Severe Difficulty Breathing:  No     Concern for life threatening condition:  Yes     The patient has the following symptoms:       Report fall with head strike, EMS out to home. Denies LOC. Now reports swelling on the neck. Extensive bruising on the foot/leg. Pain with moving right arm. Never had ED evaluation as she reports declined EMS transport and does not want to got to nearest local hospital. Reports she feels tired, trouble trying to keep her head up. She is alert and orient to  person place and time. Clear speech. Says prior to the fall was in hospital, left against medical advice.        Home Remedies:       Advised per nursing triage protocol.         Recommended disposition for patient:  Disposition: Go to ED Now     If patient referred to UC/ED advised that they may require further follow up and testing after the visit with their primary care office.     Instructed patient to call back for any new, worsening, or worrisome symptoms or concerns any time day or night.    Advised per nursing triage protocol.     Telephone Call Outcome:  Single Call Resolution        Reason for Disposition   Age over 65 years with an area of head swelling or bruise    Protocols used: Head Injury-A-OH

## 2024-09-24 NOTE — Telephone Encounter (Signed)
 Verified patient Cheryl Zimmerman DOB 1971-07-14 on 09/24/24 by Rosina Sheffield RN, BSN          Patient requesting refill on zepbound .

## 2024-09-24 NOTE — Telephone Encounter (Signed)
 PER who is calling: Pharmacy, Cheryl Zimmerman is a 54 year old female has requested a refill of       ZEPBOUND  .      Last Office Visit  : 07/05/2024 [name], MD      Last Tele Visit:  09/02/2024 Nestor, Meghan, PA-C      Last Physical Exam:  Not on file.    There are no preventive care reminders to display for this patient.    Other Med Adult:  Most Recent BP Reading(s)  08/30/24 : 110/85        Cholesterol (mg/dL)   Date Value   92/76/7976 263 (H)     LOW DENSITY LIPOPROTEIN DIRECT (mg/dL)   Date Value   92/76/7976 192 (H)     HIGH DENSITY LIPOPROTEIN (mg/dL)   Date Value   92/76/7976 40     TRIGLYCERIDES (mg/dL)   Date Value   92/76/7976 301 (H)         THYROID  SCREEN TSH REFLEX FT4 (uIU/mL)   Date Value   05/14/2024 2.040         TSH (THYROID  STIM HORMONE) (uIU/mL)   Date Value   12/10/2022 1.190       HEMOGLOBIN A1C (%)   Date Value   07/15/2022 6.5 (H)     HEMOGLOBIN A1C CARE EVERYWHERE (%)   Date Value   12/25/2023 5.8 (H)       No results found for: POCA1C      INR (no units)   Date Value   06/02/2011 1.0 (L)   06/02/2011 < 1.0 (*L)     INR CARE EVERYWHERE (no units)   Date Value   10/16/2021 1.0   09/23/2021 0.9   01/06/2021 1.1   11/11/2020 1.0   07/05/2020 0.9   07/04/2020 0.8 (L)   03/13/2020 1.0       SODIUM (mmol/L)   Date Value   08/30/2024 141       POTASSIUM (mmol/L)   Date Value   08/30/2024 4.1           CREATININE (mg/dL)   Date Value   87/87/7974 1.0       Documented patient preferred pharmacies:    [id number] [address], [place] - [address]  Phone: [phone] Fax: [phone]

## 2024-09-25 ENCOUNTER — Emergency Department (HOSPITAL_BASED_OUTPATIENT_CLINIC_OR_DEPARTMENT_OTHER)

## 2024-09-25 ENCOUNTER — Observation Stay
Admission: EM | Admit: 2024-09-25 | Discharge: 2024-09-26 | Disposition: A | Discharge status: Home / Self Care | Attending: Internal Medicine | Admitting: Internal Medicine

## 2024-09-25 ENCOUNTER — Encounter (HOSPITAL_BASED_OUTPATIENT_CLINIC_OR_DEPARTMENT_OTHER): Payer: Self-pay | Admitting: Internal Medicine

## 2024-09-25 DIAGNOSIS — R300 Dysuria: Secondary | ICD-10-CM | POA: Diagnosis present

## 2024-09-25 DIAGNOSIS — R079 Chest pain, unspecified: Secondary | ICD-10-CM | POA: Diagnosis not present

## 2024-09-25 DIAGNOSIS — M25512 Pain in left shoulder: Secondary | ICD-10-CM

## 2024-09-25 DIAGNOSIS — Z87891 Personal history of nicotine dependence: Secondary | ICD-10-CM

## 2024-09-25 DIAGNOSIS — G8929 Other chronic pain: Secondary | ICD-10-CM | POA: Diagnosis not present

## 2024-09-25 DIAGNOSIS — R519 Headache, unspecified: Secondary | ICD-10-CM | POA: Diagnosis present

## 2024-09-25 DIAGNOSIS — N179 Acute kidney failure, unspecified: Secondary | ICD-10-CM | POA: Diagnosis not present

## 2024-09-25 DIAGNOSIS — N178 Other acute kidney failure: Secondary | ICD-10-CM

## 2024-09-25 DIAGNOSIS — I5032 Chronic diastolic (congestive) heart failure: Secondary | ICD-10-CM | POA: Diagnosis not present

## 2024-09-25 DIAGNOSIS — Z043 Encounter for examination and observation following other accident: Secondary | ICD-10-CM

## 2024-09-25 DIAGNOSIS — R2689 Other abnormalities of gait and mobility: Secondary | ICD-10-CM | POA: Diagnosis present

## 2024-09-25 DIAGNOSIS — W050XXA Fall from non-moving wheelchair, initial encounter: Secondary | ICD-10-CM | POA: Diagnosis not present

## 2024-09-25 DIAGNOSIS — M48061 Spinal stenosis, lumbar 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 claudication: Secondary | ICD-10-CM

## 2024-09-25 DIAGNOSIS — E785 Hyperlipidemia, unspecified: Secondary | ICD-10-CM | POA: Diagnosis present

## 2024-09-25 DIAGNOSIS — R42 Dizziness and giddiness: Secondary | ICD-10-CM | POA: Diagnosis not present

## 2024-09-25 DIAGNOSIS — M79671 Pain in right foot: Secondary | ICD-10-CM | POA: Diagnosis present

## 2024-09-25 DIAGNOSIS — G894 Chronic pain syndrome: Secondary | ICD-10-CM | POA: Diagnosis not present

## 2024-09-25 DIAGNOSIS — Z79899 Other long term (current) drug therapy: Secondary | ICD-10-CM

## 2024-09-25 DIAGNOSIS — F250 Schizoaffective disorder, bipolar type: Secondary | ICD-10-CM | POA: Diagnosis present

## 2024-09-25 DIAGNOSIS — M4800 Spinal stenosis, site unspecified: Secondary | ICD-10-CM | POA: Diagnosis present

## 2024-09-25 DIAGNOSIS — R296 Repeated falls: Secondary | ICD-10-CM | POA: Diagnosis present

## 2024-09-25 DIAGNOSIS — K219 Gastro-esophageal reflux disease without esophagitis: Secondary | ICD-10-CM | POA: Diagnosis not present

## 2024-09-25 DIAGNOSIS — M25571 Pain in right ankle and joints of right foot: Secondary | ICD-10-CM | POA: Diagnosis present

## 2024-09-25 DIAGNOSIS — S9031XA Contusion of right foot, initial encounter: Secondary | ICD-10-CM | POA: Diagnosis not present

## 2024-09-25 DIAGNOSIS — M25522 Pain in left elbow: Secondary | ICD-10-CM

## 2024-09-25 DIAGNOSIS — E039 Hypothyroidism, unspecified: Secondary | ICD-10-CM | POA: Diagnosis not present

## 2024-09-25 DIAGNOSIS — Z993 Dependence on wheelchair: Secondary | ICD-10-CM

## 2024-09-25 DIAGNOSIS — E559 Vitamin D deficiency, unspecified: Secondary | ICD-10-CM | POA: Diagnosis present

## 2024-09-25 DIAGNOSIS — G4733 Obstructive sleep apnea (adult) (pediatric): Secondary | ICD-10-CM | POA: Diagnosis present

## 2024-09-25 DIAGNOSIS — J449 Chronic obstructive pulmonary disease, unspecified: Secondary | ICD-10-CM | POA: Diagnosis present

## 2024-09-25 DIAGNOSIS — S00412A Abrasion of left ear, initial encounter: Secondary | ICD-10-CM | POA: Diagnosis present

## 2024-09-25 DIAGNOSIS — Z1152 Encounter for screening for COVID-19: Secondary | ICD-10-CM

## 2024-09-25 DIAGNOSIS — Z6841 Body Mass Index (BMI) 40.0 and over, adult: Secondary | ICD-10-CM

## 2024-09-25 DIAGNOSIS — R0789 Other chest pain: Secondary | ICD-10-CM

## 2024-09-25 DIAGNOSIS — M7989 Other specified soft tissue disorders: Secondary | ICD-10-CM | POA: Diagnosis not present

## 2024-09-25 DIAGNOSIS — W19XXXA Unspecified fall, initial encounter: Secondary | ICD-10-CM

## 2024-09-25 LAB — BASIC METABOLIC PANEL
ANION GAP: 11 mmol/L (ref 10–22)
ANION GAP: 11 mmol/L (ref 10–22)
BUN (UREA NITROGEN): 32 mg/dL — ABNORMAL HIGH (ref 7–18)
BUN (UREA NITROGEN): 31 mg/dL — ABNORMAL HIGH (ref 7–18)
CALCIUM: 8.7 mg/dL (ref 8.5–10.5)
CALCIUM: 8.3 mg/dL — ABNORMAL LOW (ref 8.5–10.5)
CARBON DIOXIDE: 29 mmol/L (ref 21–32)
CARBON DIOXIDE: 30 mmol/L (ref 21–32)
CHLORIDE: 99 mmol/L (ref 98–107)
CHLORIDE: 100 mmol/L (ref 98–107)
CREATININE: 1.7 mg/dL — ABNORMAL HIGH (ref 0.4–1.2)
CREATININE: 1.6 mg/dL — ABNORMAL HIGH (ref 0.4–1.2)
ESTIMATED GLOMERULAR FILT RATE: 36 mL/min — ABNORMAL LOW (ref >60)
ESTIMATED GLOMERULAR FILT RATE: 38 mL/min — ABNORMAL LOW (ref >60)
Glucose Random: 98 mg/dL (ref 74–160)
Glucose Random: 106 mg/dL (ref 74–160)
POTASSIUM: 3.7 mmol/L (ref 3.5–5.1)
POTASSIUM: 3.7 mmol/L (ref 3.5–5.1)
SODIUM: 139 mmol/L (ref 136–145)
SODIUM: 141 mmol/L (ref 136–145)

## 2024-09-25 LAB — CBC, PLATELET & DIFFERENTIAL
ABSOLUTE BASO COUNT: 0 TH/uL (ref 0.0–0.1)
ABSOLUTE EOSINOPHIL COUNT: 0.4 TH/uL (ref 0.0–0.8)
ABSOLUTE IMM GRAN COUNT: 0.03 TH/uL (ref 0.00–0.10)
ABSOLUTE LYMPH COUNT: 2.1 TH/uL (ref 0.6–5.9)
ABSOLUTE MONO COUNT: 0.8 TH/uL (ref 0.2–1.4)
ABSOLUTE NEUTROPHIL COUNT: 5.4 TH/uL (ref 1.6–8.3)
ABSOLUTE NRBC COUNT: 0 TH/uL (ref 0.0–0.0)
BASOPHIL %: 0.2 % (ref 0.0–1.2)
EOSINOPHIL %: 4.2 % (ref 0.0–7.0)
HEMATOCRIT: 45.1 % — ABNORMAL HIGH (ref 34.1–44.9)
HEMOGLOBIN: 14.8 g/dL (ref 11.2–15.7)
IMMATURE GRANULOCYTE %: 0.3 % (ref 0.0–1.0)
LYMPHOCYTE %: 24 % (ref 15.0–54.0)
MEAN CORP HGB CONC: 32.8 g/dL (ref 31.0–37.0)
MEAN CORPUSCULAR HGB: 31.2 pg (ref 26.0–34.0)
MEAN CORPUSCULAR VOL: 95.1 fl (ref 80.0–100.0)
MEAN PLATELET VOLUME: 11.6 fL (ref 8.7–12.5)
MONOCYTE %: 8.8 % (ref 4.0–13.0)
NEUTROPHIL %: 62.5 % (ref 40.0–75.0)
NRBC %: 0 % (ref 0.0–0.0)
PLATELET COUNT: 217 TH/uL (ref 150–400)
RBC DISTRIBUTION WIDTH STD DEV: 50.4 fL — ABNORMAL HIGH (ref 35.1–46.3)
RED BLOOD CELL COUNT: 4.74 M/uL (ref 3.90–5.20)
WHITE BLOOD CELL COUNT: 8.6 TH/uL (ref 4.0–11.0)

## 2024-09-25 LAB — POC URINALYSIS
BILIRUBIN, URINE: NEGATIVE
GLUCOSE,URINE: NEGATIVE
KETONE, URINE: NEGATIVE
LEUKOCYTE ESTERASE: NEGATIVE
NITRITE, URINE: NEGATIVE
OCCULT BLOOD, URINE: NEGATIVE
PH URINE: 5.5 (ref 5.0–8.0)
PROTEIN, URINE: NEGATIVE
SPECIFIC GRAVITY, URINE: 1.015 (ref 1.003–1.030)
UROBILINOGEN URINE: 1 (ref 0.2–1.0)

## 2024-09-25 LAB — CREATININE RANDOM URINE: CREATININE RANDOM URINE: 127 mg/dL (ref 28–217)

## 2024-09-25 LAB — URINALYSIS RFLX TO URINE CULT
BILIRUBIN, URINE: NEGATIVE
GLUCOSE, URINE: NEGATIVE mg/dL
KETONE, URINE: NEGATIVE mg/dL
NITRITE, URINE: NEGATIVE
OCCULT BLOOD, URINE: NEGATIVE
PH, URINE: 5.5 (ref 5.0–8.0)
SPECIFIC GRAVITY, URINE: 1.019 (ref 1.003–1.035)
UROBILINOGEN, URINE: 1 U/dL (ref 0.2–1.0)

## 2024-09-25 LAB — BLOOD SUGAR FINGERSTICK (POINT OF CARE): FINGERSTICK GLUCOSE: 89 mg/dL (ref 74–160)

## 2024-09-25 LAB — TROPONIN T HS 3 HOUR
DELTA 3 HOUR TROPONIN T HS: 1 ng/L (ref 0–7)
TROPONIN T HS 3 HOUR RESULT: 12 ng/L — ABNORMAL HIGH (ref 0–10)

## 2024-09-25 LAB — RESPIRATORY PANEL BASIC INPAT
INFLUENZA A: NEGATIVE
INFLUENZA B: NEGATIVE
RESPIRATORY SYNCYTIAL VIRUS: NEGATIVE
SARS-COV-2: NEGATIVE

## 2024-09-25 LAB — TROPONIN T HS 1 HOUR
DELTA 1 HOUR TROPONIN T HS: 2 ng/L (ref 0–4)
TROPONIN T HS 1 HOUR RESULT: 11 ng/L — ABNORMAL HIGH (ref 0–10)

## 2024-09-25 LAB — VITAMIN B12: VITAMIN B12: 1089 pg/mL (ref 232–1245)

## 2024-09-25 LAB — SODIUM RANDOM URINE: SODIUM RANDOM URINE: <20 mmol/L

## 2024-09-25 LAB — TSH (THYROID STIMULATING HORMONE): TSH (THYROID STIM HORMONE): 1.33 u[IU]/mL (ref 0.270–4.200)

## 2024-09-25 LAB — HOLD BLUE TOP TUBE

## 2024-09-25 LAB — CREATINE KINASE TOTAL: CREATINE KINASE TOTAL: 971 U/L — ABNORMAL HIGH (ref 26–192)

## 2024-09-25 LAB — MAGNESIUM: MAGNESIUM: 1.7 mg/dL (ref 1.6–2.6)

## 2024-09-25 LAB — FOLATE: FOLATE: >20 ng/mL (ref 4.6–?)

## 2024-09-25 LAB — URINE CULTURE WILL NOT BE DONE

## 2024-09-25 LAB — NT-PROBNP: NT-proBNP: 87 pg/mL (ref 0–125)

## 2024-09-25 LAB — TROPONIN T HS BASELINE: TROPONIN T HS BASELINE: 13 ng/L — ABNORMAL HIGH (ref 0–10)

## 2024-09-25 MED ORDER — VITAMIN D 25 MCG (1000 UT) PO TABS
1000.0000 [IU] | ORAL_TABLET | Freq: Every day | ORAL | Status: DC
  Administered 2024-09-25 – 2024-09-26 (×2): 1000 [IU] via ORAL
  Filled 2024-09-25 (×2): qty 1

## 2024-09-25 MED ORDER — FLUOXETINE HCL 20 MG PO CAPS
40.0000 mg | ORAL_CAPSULE | Freq: Every day | ORAL | Status: DC
  Administered 2024-09-25 – 2024-09-26 (×2): 40 mg via ORAL
  Filled 2024-09-25 (×2): qty 2

## 2024-09-25 MED ORDER — BUMETANIDE 1 MG PO TABS
2.0000 mg | ORAL_TABLET | Freq: Every day | ORAL | Status: DC
  Filled 2024-09-25: qty 2

## 2024-09-25 MED ORDER — ACETAMINOPHEN 500 MG PO TABS
1000.0000 mg | ORAL_TABLET | Freq: Once | ORAL | Status: AC
  Administered 2024-09-25: 1000 mg via ORAL
  Filled 2024-09-25: qty 2

## 2024-09-25 MED ORDER — CLONAZEPAM 1 MG PO TABS
1.0000 mg | ORAL_TABLET | Freq: Two times a day (BID) | ORAL | Status: DC | PRN
  Administered 2024-09-26: 1 mg via ORAL
  Filled 2024-09-25: qty 1

## 2024-09-25 MED ORDER — LEVETIRACETAM 500 MG PO TABS
750.0000 mg | ORAL_TABLET | Freq: Two times a day (BID) | ORAL | Status: DC
  Administered 2024-09-25 – 2024-09-26 (×2): 750 mg via ORAL
  Filled 2024-09-25 (×2): qty 1

## 2024-09-25 MED ORDER — ACETAMINOPHEN 325 MG PO TABS
650.0000 mg | ORAL_TABLET | Freq: Four times a day (QID) | ORAL | Status: DC | PRN
  Administered 2024-09-26: 650 mg via ORAL
  Filled 2024-09-25: qty 2

## 2024-09-25 MED ORDER — PRAZOSIN HCL 1 MG PO CAPS
2.0000 mg | ORAL_CAPSULE | Freq: Every evening | ORAL | Status: DC
  Administered 2024-09-25: 2 mg via ORAL
  Filled 2024-09-25: qty 2

## 2024-09-25 MED ORDER — LEVOTHYROXINE SODIUM 25 MCG PO TABS
25.0000 ug | ORAL_TABLET | Freq: Every morning | ORAL | Status: DC
  Administered 2024-09-26: 25 ug via ORAL
  Filled 2024-09-25: qty 1

## 2024-09-25 MED ORDER — BACLOFEN 10 MG PO TABS
10.0000 mg | ORAL_TABLET | Freq: Three times a day (TID) | ORAL | Status: DC
  Administered 2024-09-25 – 2024-09-26 (×3): 10 mg via ORAL
  Filled 2024-09-25 (×3): qty 1

## 2024-09-25 MED ORDER — BUSPIRONE HCL 5 MG PO TABS
15.0000 mg | ORAL_TABLET | Freq: Three times a day (TID) | ORAL | Status: DC
  Administered 2024-09-25 – 2024-09-26 (×3): 15 mg via ORAL
  Filled 2024-09-25 (×3): qty 1

## 2024-09-25 MED ORDER — TOPIRAMATE 25 MG PO TABS
50.0000 mg | ORAL_TABLET | Freq: Two times a day (BID) | ORAL | Status: DC
  Administered 2024-09-25 – 2024-09-26 (×2): 50 mg via ORAL
  Filled 2024-09-25 (×2): qty 2

## 2024-09-25 MED ORDER — SPIRONOLACTONE 100 MG PO TABS
100.0000 mg | ORAL_TABLET | Freq: Every day | ORAL | Status: DC
  Filled 2024-09-25: qty 1

## 2024-09-25 MED ORDER — OLANZAPINE 5 MG PO TABS
5.0000 mg | ORAL_TABLET | Freq: Two times a day (BID) | ORAL | Status: DC
  Administered 2024-09-26: 5 mg via ORAL
  Filled 2024-09-25: qty 1

## 2024-09-25 MED ORDER — POLYETHYLENE GLYCOL 3350 17 G PO PACK: 17.0000 g | PACK | Freq: Every day | ORAL | Status: DC | PRN

## 2024-09-25 MED ORDER — QUETIAPINE FUMARATE 50 MG PO TABS
100.0000 mg | ORAL_TABLET | Freq: Every evening | ORAL | Status: DC
  Administered 2024-09-25: 100 mg via ORAL
  Filled 2024-09-25: qty 2

## 2024-09-25 MED ORDER — MELATONIN 3 MG PO TABS: 3.0000 mg | ORAL_TABLET | Freq: Every evening | ORAL | Status: DC | PRN

## 2024-09-25 MED ORDER — PANTOPRAZOLE SODIUM 40 MG PO TBEC
40.0000 mg | DELAYED_RELEASE_TABLET | Freq: Every day | ORAL | Status: DC
  Administered 2024-09-26: 40 mg via ORAL
  Filled 2024-09-25: qty 1

## 2024-09-25 MED ORDER — NICOTINE POLACRILEX 2 MG MT GUM: 4.0000 mg | CHEWING_GUM | OROMUCOSAL | Status: DC | PRN

## 2024-09-25 MED ORDER — ATORVASTATIN CALCIUM 80 MG PO TABS
80.0000 mg | ORAL_TABLET | Freq: Every day | ORAL | Status: DC
  Administered 2024-09-26: 80 mg via ORAL
  Filled 2024-09-25: qty 1

## 2024-09-25 MED ORDER — CETIRIZINE HCL 10 MG PO TABS
10.0000 mg | ORAL_TABLET | Freq: Every day | ORAL | Status: DC
  Administered 2024-09-25 – 2024-09-26 (×2): 10 mg via ORAL
  Filled 2024-09-25 (×2): qty 1

## 2024-09-25 MED ORDER — LACTATED RINGERS IV BOLUS
500.0000 mL | Freq: Once | INTRAVENOUS | Status: AC
  Administered 2024-09-25: 500 mL via INTRAVENOUS

## 2024-09-25 MED ORDER — GABAPENTIN 400 MG PO CAPS
400.0000 mg | ORAL_CAPSULE | Freq: Three times a day (TID) | ORAL | Status: DC
  Administered 2024-09-25 – 2024-09-26 (×3): 400 mg via ORAL
  Filled 2024-09-25 (×3): qty 1

## 2024-09-25 MED ORDER — HEPARIN SODIUM (PORCINE) 5000 UNIT/ML IJ SOLN
5000.0000 [IU] | Freq: Two times a day (BID) | INTRAMUSCULAR | Status: DC
  Filled 2024-09-25 (×2): qty 1

## 2024-09-25 MED ORDER — BUPRENORPHINE HCL-NALOXONE HCL 8-2 MG SL SUBL
16.0000 mg | SUBLINGUAL_TABLET | Freq: Two times a day (BID) | SUBLINGUAL | Status: DC
  Administered 2024-09-25 – 2024-09-26 (×2): 2 via SUBLINGUAL
  Filled 2024-09-25 (×2): qty 2

## 2024-09-25 MED ORDER — BUDESONIDE-FORMOTEROL FUMARATE 160-4.5 MCG/ACT IN AERO
2.0000 | INHALATION_SPRAY | Freq: Two times a day (BID) | RESPIRATORY_TRACT | Status: DC
  Administered 2024-09-25 – 2024-09-26 (×2): 2 via RESPIRATORY_TRACT
  Filled 2024-09-25: qty 6

## 2024-09-25 NOTE — Progress Notes (Signed)
 2 puffs Symbicort  given via spacer with good technique, including mouth rinse.  NPC  BS Coarse and clear.  HR 78 RR 18 O2 sat 92 % on a 2lpm NC.

## 2024-09-25 NOTE — ED Triage Note (Addendum)
 Pt c/o fall 2 days ago. Pt states her legs gave out and fell, +headstrike, denies loc or blood thinners. Pt c/o lump behind left ear, left shoulder pain, left elbow pain and right foot pain. Pt reports left breast pain started this morning. Pt also reports BLE swelling. Hx of CHF, uses wheelchair. +scratches to left shoulder/neck, back. + large old abasion on left elbow. +bruising to right foot

## 2024-09-25 NOTE — Narrator Note (Signed)
 Report given to Christiane Ha, Charity fundraiser.
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
## 2024-09-25 NOTE — Care Coordination (Signed)
 Chart reviewed for point of entry admission by ED CM

## 2024-09-25 NOTE — Plan of Care (Signed)
 Problem: Activity:  Goal: Mobility will be supported throughout the hospitalization,  Outcome: Progressing     Problem: Cognitive:  Goal: Caregivers and patients knowledge of risk factors and measures for prevention of condition will be supported throughout the hospitalization  Outcome: Progressing     Problem: Safety:  Goal: Will remain free from falls throughout the hospitalization,  Outcome: Progressing

## 2024-09-25 NOTE — ED Provider Notes (Signed)
 EMERGENCY DEPARTMENT PHYSICIAN ASSISTANT NOTE    I reviewed the patient's vital signs, nursing note, past medical history/problem list, past surgical history, medication list, social history and allergies.  This patient was seen with Emergency Department attending physician Dr. Clarene        CHIEF COMPLAINT    Patient presents with:  Fall  Pain      HPI    Cheryl Zimmerman is a 54 year old female who presents to ED for multiple complaints.  She states that she fell 2 days ago while attempting to transfer from her wheelchair to the toilet, resulting in multiple injuries.  She states she was evaluated at [facility] and was told everything looked okay. Record unfortunately not available in Epic, however I do see a visit to [facility] from 09/19/24 for ankle pain which the patient states was unrelated.  She complains of left shoulder, left elbow, right foot, right ankle and left inframammary chest pain as well as a painful scratch behind her left ear.  She also reports leg swelling and dizziness.  Patient uses wheelchair at baseline because of an old back injury.  Pt reports she traveled here from her home in [place] because her PCP is at [facility].     REVIEW OF SYSTEMS    Pertinent positives and negatives as noted in HPI above    Past Medical History:  Past Medical History:  schizo: Bipolar affective ([name])  No date: Chronic systolic CHF (congestive heart failure) ([name])  No date: Depression  12/23/2021: Fall in home  12/23/2021: Gait instability  12/23/2021: Requires continuous at home supplemental oxygen   depress: Schizo affective schizophrenia ([name])  12/23/2021: Spinal stenosis of lumbar 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   claudication  No date: Substance abuse ([name]) Current Medications:  Current Facility-Administered Medications   Medication    heparin  (porcine) injection 5,000 Units    acetaminophen  (TYLENOL ) tablet 650 mg    melatonin tablet 3 mg    [START ON 09/26/2024] atorvastatin  (LIPITOR) tablet 80 mg    baclofen  (LIORESAL ) tablet  10 mg    budesonide -formoterol  (SYMBICORT ,BREYNA ) 160-4.5 MCG/ACT inhaler 2 puff    [START ON 09/26/2024] bumetanide  (BUMEX ) tablet 2 mg    buprenorphine -naloxone  (SUBOXONE ) 8-2 MG SL tablet 1 tablet    busPIRone  (BUSPAR ) tablet 15 mg    cetirizine  (ZYRTEC ) tablet 10 mg    cholecalciferol  (VITAMIN D3) tablet 1,000 Units    clonazePAM  (KLONOPIN ) tablet 1 mg    FLUoxetine  (PROzac ) capsule 40 mg    gabapentin  (NEURONTIN ) capsule 400 mg    levETIRAcetam  (KEPPRA ) tablet 750 mg    [START ON 09/26/2024] levothyroxine  (SYNTHROID ) tablet 25 mcg    nicotine  polacrilex (NICORETTE ) gum 4 mg    [START ON 09/26/2024] OLANZapine  (ZYPREXA ) tablet 5 mg    [START ON 09/26/2024] pantoprazole  (PROTONIX ) EC tablet 40 mg    polyethylene glycol (GLYCOLAX /MIRALAX ) packet 17 g    prazosin  (MINIPRESS ) capsule 2 mg    QUEtiapine  (SEROquel ) tablet 100 mg    [START ON 09/26/2024] spironolactone  (ALDACTONE ) tablet 100 mg    topiramate  (TOPAMAX ) tablet 50 mg         Problem List  Patient Active Problem List:     Dyspnea on minimal exertion     Suicidal behavior with attempted self-injury ([name])     Tobacco use disorder     Systolic congestive heart failure ([name])     Seizure disorder ([name])     Schizoaffective disorder, depressive type ([name])     Suicidal ideation  Alcohol  use disorder     Borderline personality disorder ([name])     Chronic pain disorder     Morbid obesity ([name])     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)     PTSD (post-traumatic stress disorder)     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     (HFpEF) heart failure with preserved ejection fraction ([name])     MDD (major depressive disorder), recurrent, severe, with psychosis ([name])     Requires continuous at home supplemental oxygen      Spinal stenosis of lumbar 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 claudication     Fall in home     Gait instability     Hypokalemia     Lung nodule (12/30/2022)     Stable angina ([name])     Chest pain, atypical     Obesity hypoventilation syndrome ([name])     Osteoarthritis of  spine with radiculopathy, lumbar region     Chronic bilateral low back pain with bilateral sciatica     Hypoxia     Alcohol  withdrawal syndrome, uncomplicated ([name])     Cocaine  use     Homicidal ideation     Somnolence     Obstructive sleep apnea syndrome     Acute metabolic encephalopathy     B12 deficiency     Vitamin D  deficiency     H/O total hysterectomy     Thyroid  condition     AKI (acute kidney injury) ([name])   Allergies:  Review of Patient's Allergies indicates:   Bee venom               Anaphylaxis   Metolazone               Other (See Comments)    Comment:Hypokalemia   Trazodone                Hives   Methylprednisolone      Dizziness, Drowsiness    Comment:Also believes syncope. Seems to tolerate             inhalational and epidural steroid.   Nutritional supplem*       Tegretol [carbamaze*    Hives   Hydroxyzine              Nausea Only   Surgical History:  No past surgical history on file. Family History:  Review of patient's family history indicates:  Problem: COPD      Relation: Mother          Age of Onset: (Not Specified)  Problem: Cancer - Lung      Relation: Father          Age of Onset: (Not Specified)          Comment: + smoking history  Problem: Cancer - Lung      Relation: Sister          Age of Onset: 76          Comment: + smoking history  Problem: Diabetes      Relation: Maternal Grandmother          Age of Onset: (Not Specified)  Problem: Cancer - Colon      Relation: Maternal Grandmother          Age of Onset: 26     Social History:  Social History    Tobacco Use      Smoking status: Former        Packs/day: 0.00        Years:  3.0 packs/day for 30.0 years (90.0 ttl pk-yrs)        Types: Cigarettes        Quit date: 2022        Years since quitting: 4.0        Passive exposure: Past      Smokeless tobacco: Never      Tobacco comments: Pt currently on nicotine  patch and gum    Alcohol  use: Yes      Comment: 2 liters of captain morgan daily   Social Factors Significantly Limiting Diagnosis or  Treatment:           PHYSICAL EXAM      Vital Signs: BP 97/59   Pulse 74   Temp 98.4 F (Oral)   Resp 18   Wt (!) 148.3 kg (327 lb)   SpO2 92%   BMI 54.42 kg/m      Physical Exam  Vitals reviewed.   Constitutional:       General: She is not in acute distress.     Appearance: She is obese. She is not ill-appearing.   HENT:      Head: Normocephalic and atraumatic.      Mouth/Throat:      Mouth: Mucous membranes are moist.   Cardiovascular:      Rate and Rhythm: Normal rate and regular rhythm.      Heart sounds: Normal heart sounds.   Pulmonary:      Effort: No respiratory distress.      Breath sounds: Normal breath sounds.   Chest:      Chest wall: Tenderness (Left inframammary anterior chest wall) present.   Abdominal:      Palpations: Abdomen is soft.      Tenderness: There is no abdominal tenderness.   Musculoskeletal:      Cervical back: Normal range of motion and neck supple. No rigidity.      Comments: Ecchymosis right forefoot  Left shoulder tender to palpation.    Extremities neurovascularly intact x 4  No pitting pedal edema or unilateral leg swelling   Skin:     General: Skin is warm and dry.      Capillary Refill: Capillary refill takes less than 2 seconds.   Neurological:      General: No focal deficit present.      Mental Status: She is alert and oriented to person, place, and time.   Psychiatric:         Behavior: Behavior normal.          RESULTS  Results for orders placed or performed during the hospital encounter of 09/25/24 (from the past 24 hours)   CBC, Platelet & Differential    Collection Time: 09/25/24  9:19 AM   Result Value    WHITE BLOOD CELL COUNT 8.6    RED BLOOD CELL COUNT 4.74    HEMOGLOBIN 14.8    HEMATOCRIT 45.1 (H)    MEAN CORPUSCULAR VOL 95.1    MEAN CORPUSCULAR HGB 31.2    MEAN CORP HGB CONC 32.8    RBC DISTRIBUTION WIDTH STD DEV 50.4 (H)    PLATELET COUNT 217    MEAN PLATELET VOLUME 11.6    NEUTROPHIL % 62.5    IMMATURE GRANULOCYTE % 0.3    LYMPHOCYTE % 24.0    MONOCYTE % 8.8     EOSINOPHIL % 4.2    BASOPHIL % 0.2    NRBC % 0.0    ABSOLUTE NEUTROPHIL COUNT 5.4    ABSOLUTE  IMM GRAN COUNT 0.03    ABSOLUTE LYMPH COUNT 2.1    ABSOLUTE MONO COUNT 0.8    ABSOLUTE EOSINOPHIL COUNT 0.4    ABSOLUTE BASO COUNT 0.0    ABSOLUTE NRBC COUNT 0.0   Basic Metabolic Panel    Collection Time: 09/25/24  9:19 AM   Result Value    SODIUM 139    POTASSIUM 3.7    CHLORIDE 99    CARBON DIOXIDE 29    ANION GAP 11    CALCIUM  8.7    Glucose Random 98    BUN (UREA NITROGEN) 32 (H)    CREATININE 1.7 (H)    ESTIMATED GLOMERULAR FILT RATE 36 (L)   Magnesium     Collection Time: 09/25/24  9:19 AM   Result Value    MAGNESIUM  1.7   Troponin T HS Baseline    Collection Time: 09/25/24  9:19 AM   Result Value    TROPONIN T HS BASELINE 13 (H)   NT-proBNP    Collection Time: 09/25/24  9:19 AM   Result Value    NT-proBNP 87   Hold Blue Top Tube    Collection Time: 09/25/24  9:19 AM   Result Value    HOLD BLUE TOP TUBE RECEIVED IN HEMATOL   Fingerstick Blood Sugar (Point of Care)    Collection Time: 09/25/24 10:07 AM   Result Value    FINGERSTICK GLUCOSE 89   Troponin T HS 1 Hour    Collection Time: 09/25/24 10:13 AM   Result Value    TROPONIN T HS 1 HOUR RESULT 11 (H)    DELTA 1 HOUR TROPONIN T HS 2   Troponin T HS 3 Hour    Collection Time: 09/25/24 12:14 PM   Result Value    TROPONIN T HS 3 HOUR RESULT 12 (H)    DELTA 3 HOUR TROPONIN T HS 1    Narrative    Tests added: CK by ML on 09/25/24 at 1458 by [id number].   Basic Metabolic Panel    Collection Time: 09/25/24 12:14 PM   Result Value    SODIUM 141    POTASSIUM 3.7    CHLORIDE 100    CARBON DIOXIDE 30    ANION GAP 11    CALCIUM  8.3 (L)    Glucose Random 106    BUN (UREA NITROGEN) 31 (H)    CREATININE 1.6 (H)    ESTIMATED GLOMERULAR FILT RATE 38 (L)   Creatine Kinase Total    Collection Time: 09/25/24 12:14 PM   Result Value    CREATINE KINASE TOTAL 971 (H)   Urinalysis Rflx to Urine Cult    Collection Time: 09/25/24  2:55 PM   Result Value    BILIRUBIN, URINE Negative    OCCULT  BLOOD, URINE Negative    CLARITY, URINE Clear    COLOR, URINE Yellow    GLUCOSE, URINE Negative    KETONE, URINE Negative    LEUKOCYTES, URINE Trace (A)    NITRITE, URINE Negative    PH, URINE 5.5    PROTEIN, URINE Trace (A)    SPECIFIC GRAVITY, URINE 1.019    UROBILINOGEN, URINE 1.0    RED BLOOD CELLS, URINE 0-2    WHITE BLOOD CELLS, URINE 0-5    EPITHELIAL CELLS, URINE 3-5 (A)    CASTS, URINE 0-2    BACTERIA, URINE Moderate (A)    MICROSCOPIC REVIEW Not Indicated    Narrative    UCV&Urine, Clean Void   Creatinine Random  Urine    Collection Time: 09/25/24  2:55 PM   Result Value    CREATININE RANDOM URINE 127    Narrative    Tests added: CRTU,NAU by  on 09/25/24 at 1602 by [id number].   Sodium Random Urine    Collection Time: 09/25/24  2:55 PM   Result Value    SODIUM RANDOM URINE < 20    Narrative    Tests added: CRTU,NAU by  on 09/25/24 at 1602 by [id number].   POC Urinalysis    Collection Time: 09/25/24  3:03 PM   Result Value    COLOR DARK YELLOW    CLARITY CLEAR    GLUCOSE,URINE NEGATIVE    BILIRUBIN, URINE NEGATIVE    KETONE, URINE NEGATIVE    SPECIFIC GRAVITY, URINE 1.015    UROBILINOGEN URINE 1.0    OCCULT BLOOD, URINE NEGATIVE    PH URINE 5.5    PROTEIN, URINE NEGATIVE    NITRITE, URINE NEGATIVE    LEUKOCYTE ESTERASE NEGATIVE   Urine Culture Will Not Be Done    Collection Time: 09/25/24  3:32 PM    Specimen: Urine, Clean Void   Result Value    URINE CULTURE WILL NOT BE DONE      A UA Reflex UC was ordered for this specimen.  The urine  culture will not be done because the criteria for performing  the urine culture of >10 WBC/HPF was not met on the  Urinalysis microscopic result.  If a culture is determined to be clinically necessary,  please contact the laboratory within 24 hours of the  specimen collection to add the urine culture.          RADIOLOGY  XR Foot Right minimum 3 views  Result Date: 09/25/2024  Exam: Right foot, 3 views Indication: fall Comparison: none Findings: Bones and Joints: There is no acute fracture  or suspicious bone lesion. Hardware is seen in the distal tibia and fibula status post ORIF for healed ankle fractures. There is a plantar spur and Achilles enthesophyte on the calcaneus. There is no dislocation. There is a marked hallux valgus deformity of the first ray, and hammertoe deformities of the second through fifth toes Soft Tissues: Unremarkable.       No acute fractures. Hammertoe deformities of the second through fifth toes, and hallux valgus deformity of the first ray. Plantar spur and Achilles enthesophyte on the calcaneus. Status post ORIF with healed ankle fractures demonstrated Reviewed and Electronically Signed By: Slater Lute, MD Signed Date and Time: 09/25/2024 11:45 AM     XR Shoulder Left minimum 2 views  Result Date: 09/25/2024  Exam: Left shoulder, 3 views Indication: fall Comparison: None Findings: Bones and Joints: There is what appears to be a chronic fracture deformity of the greater tuberosity, likely from a Hill-Sachs deformity. There are ossified loose bodies. No obvious acute fracture is seen by plain radiographic technique. The glenohumeral and acromioclavicular joints are preserved. Soft Tissues: Unremarkable.       Chronic appearing fracture deformity of the greater tuberosity, likely an old Hill-Sachs injury. No acute fractures seen by plain radiographic technique Reviewed and Electronically Signed By: Slater Lute, MD Signed Date and Time: 09/25/2024 11:39 AM     XR Elbow Left minimum 3 views  Result Date: 09/25/2024  Technique: Left elbow, 5 views Indication: fall Comparison: none Findings: Bones and Joints: There is no fracture or suspicious bone lesion. There is a spur on the olecranon process. There is no dislocation. There are  ossified loose bodies in the joint space. The joint spaces are otherwise maintained. No joint effusion is seen. Soft Tissues: Unremarkable.       No fracture or effusion Ossified loose bodies in the joint space Olecranon spur Reviewed and Electronically Signed  By: Slater Lute, MD Signed Date and Time: 09/25/2024 11:36 AM     XR Ankle Right minimum 3 views  Result Date: 09/25/2024  Technique: Right Ankle, 3 views Indication: fall Comparison: None Findings: Bones and Joints: Plate and screws are seen along a healed distal fibula fracture, and a screw through the medial malleolus is seen through a healed fracture. No acute fracture is appreciated. There is a large plantar spur and a small Achilles enthesophyte on the calcaneus.. There is mild degenerative narrowing of the ankle mortise. No joint effusion is seen. Soft Tissues: Unremarkable         No acute fractures. Healed fractures of the distal tibia and fibula status post ORIF. Plantar spur and Achilles enthesophyte on the calcaneus Reviewed and Electronically Signed By: Slater Lute, MD Signed Date and Time: 09/25/2024 11:29 AM     XR Chest 2 views  Result Date: 09/25/2024  TECHNIQUE: Chest, 2 views INDICATION: left inframammary chest pain COMPARISON: 08/30/2024, 05/29/2023 FINDINGS: Lungs: There is a patchy right lower lobe opacity. The lungs are otherwise clear Pleura: There is no pleural effusion or pneumothorax. Heart: The cardiac silhouette is unremarkable. Mediastinum/hila: There is aortic calcification. Bones and Soft Tissues: There are degenerative changes of the thoracic spine.     Patchy right lower lobe opacity Reviewed and Electronically Signed By: Slater Lute, MD Signed Date and Time: 09/25/2024 11:26 AM     XR Chest Portable  Result Date: 08/30/2024  TECHNIQUE: Portable chest, 10:09 a.m. Indication: shortness of breath Comparison: 05/29/2023 and 04/07/2022 FINDINGS: Quality: Satisfactory.  Tubes/lines: None. Lungs: The lungs are clear. Pleura: There is no pleural effusion or pneumothorax. Heart: The cardiac silhouette is unremarkable. Mediastinum/hila: Unremarkable. Bones and Soft Tissues: There is thoracic dextroscoliosis. There is an old right humeral head/neck deformity. A small round metallic object is redemonstrated  overlying the lower neck/spine in the midline.      No acute cardiopulmonary findings on portable chest radiograph. Reviewed and Electronically Signed By: Elia Frost, MD Signed Date and Time: 08/30/2024 10:11 AM              MEDICATIONS ADMINISTERED ON THIS VISIT  Orders Placed This Encounter      lactated ringers  IV bolus 500 mL      acetaminophen  (TYLENOL ) tablet 1,000 mg      heparin  (porcine) injection 5,000 Units      acetaminophen  (TYLENOL ) tablet 650 mg      melatonin tablet 3 mg      buprenorphine -naloxone  (SUBOXONE ) 8-2 MG sublingual tablet          Sig: Place 2 tablets under the tongue in the morning and 2 tablets before bedtime.      levETIRAcetam  (KEPPRA ) 750 MG tablet          Sig: Take 750 mg by mouth in the morning and 750 mg before bedtime.      atorvastatin  (LIPITOR) tablet 80 mg      baclofen  (LIORESAL ) tablet 10 mg      budesonide -formoterol  (SYMBICORT ,BREYNA ) 160-4.5 MCG/ACT inhaler 2 puff      bumetanide  (BUMEX ) tablet 2 mg      buprenorphine -naloxone  (SUBOXONE ) 8-2 MG SL tablet 1 tablet      busPIRone  (BUSPAR ) tablet 15  mg      cetirizine  (ZYRTEC ) tablet 10 mg      cholecalciferol  (VITAMIN D3) tablet 1,000 Units      clonazePAM  (KLONOPIN ) tablet 1 mg      FLUoxetine  (PROzac ) capsule 40 mg      gabapentin  (NEURONTIN ) capsule 400 mg      levETIRAcetam  (KEPPRA ) tablet 750 mg      levothyroxine  (SYNTHROID ) tablet 25 mcg      nicotine  polacrilex (NICORETTE ) gum 4 mg      OLANZapine  (ZYPREXA ) tablet 5 mg      pantoprazole  (PROTONIX ) EC tablet 40 mg      polyethylene glycol (GLYCOLAX /MIRALAX ) packet 17 g      prazosin  (MINIPRESS ) capsule 2 mg      QUEtiapine  (SEROquel ) tablet 100 mg      spironolactone  (ALDACTONE ) tablet 100 mg      topiramate  (TOPAMAX ) tablet 50 mg      ED COURSE & MEDICAL DECISION MAKING      Pt is a 54 year old female who presents to ED with multiple complaints including recent fall with multiple injuries, inability to care for herself at home, dizziness and left-sided chest  pain since this morning.  Uses wheelchair at baseline but states she is newly unable to stand and pivot to use the bathroom. Pt states she cannot go home because she does not feel safe. She has a home health aid but states they are unreliable. Pt follows with [facility].         ED Course as of 09/25/24 1741   Wed Sep 25, 2024   1125 D/W Case management   1208 XR Foot Right minimum 3 views  No acute fractures.  Hammertoe deformities of the second through fifth toes, and hallux valgus deformity of the first ray.  Plantar spur and Achilles enthesophyte on the calcaneus.  Status post ORIF with healed ankle fractures demonstrated     1209 XR Shoulder Left minimum 2 views  IMPRESSION:      Chronic appearing fracture deformity of the greater tuberosity, likely an old Hill-Sachs injury. No acute fractures seen by plain radiographic technique     1209 XR Elbow Left minimum 3 views  IMPRESSION:      No fracture or effusion  Ossified loose bodies in the joint space  Olecranon spur           1209 XR Ankle Right minimum 3 views  IMPRESSION:      No acute fractures.   Healed fractures of the distal tibia and fibula status post ORIF.   Plantar spur and Achilles enthesophyte on the calcaneus        1209 XR Chest 2 views  IMPRESSION:     Patchy right lower lobe opacity        Labs notable for BUN/Cr of 32/1.7 from baseline of normal renal function.  BMP relatively unchanged after 500 mL IV fluid and p.o. fluids.  Moreover , patient notes she has been drinking a lot of water and denies any urinary retention, raising suspicion for intrinsic kidney disease. She will be admitted for workup of her AKI.    Troponins flat, do not suspect ACS.  NT proBNP 87 suspect acute heart failure or fluid retention  POC urinalysis bland.  EKG reassuring.  Imaging with no acute findings besides patchy right lower lung field infiltrate.  Low suspicion for bacterial pneumonia as patient is without cough, shortness of breath, or fever. Her CP is likely MSK  from her  fall.   RN reported dark urine. CK elevated but not above 5000.      Clinical Impression:  Fall, initial encounter  AKI (acute kidney injury) ([name])    Disposition:   Admit To [name]    Patient Condition:  Stable      Marsa Font, PA-C  Emergency Department   [facility]          A portion of this note has been transcribed with a voice recognition system. Occasional wrong-word or sound-alike substitutions may have occurred due to the inherent limitations of voice recognition software.
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
## 2024-09-25 NOTE — Narrator Note (Signed)
 Report to [facility]

## 2024-09-25 NOTE — Narrator Note (Signed)
 Pt's social worker, Garrel Shropshire, phone number [phone]

## 2024-09-25 NOTE — Narrator Note (Signed)
 Patient Disposition  Patient education for diagnosis, medications, activity, diet and follow-up.  Patient left ED 4:10 PM.  Patient rep received written instructions.    Interpreter to provide instructions: No    Patient belongings with patient: YES    Have all existing LDAs been addressed? Yes    Have all IV infusions been stopped? Yes    Destination: Admit to:  Medical/surgical unit with [facility]

## 2024-09-25 NOTE — Care Coordination (Signed)
 Consulted regarding community and home supports. CM reached out to [facility] liaison who reports pt has SN, Homemaker and PCA services. [facility] community RN will reach out to pt with 48 hrs of discharge for check in and will complete a review of  services at that time.

## 2024-09-26 ENCOUNTER — Observation Stay (HOSPITAL_BASED_OUTPATIENT_CLINIC_OR_DEPARTMENT_OTHER)

## 2024-09-26 DIAGNOSIS — G894 Chronic pain syndrome: Secondary | ICD-10-CM | POA: Diagnosis not present

## 2024-09-26 DIAGNOSIS — N179 Acute kidney failure, unspecified: Secondary | ICD-10-CM | POA: Diagnosis not present

## 2024-09-26 DIAGNOSIS — W19XXXA Unspecified fall, initial encounter: Secondary | ICD-10-CM | POA: Diagnosis not present

## 2024-09-26 DIAGNOSIS — I5032 Chronic diastolic (congestive) heart failure: Secondary | ICD-10-CM | POA: Diagnosis not present

## 2024-09-26 LAB — BASIC METABOLIC PANEL
ANION GAP: 12 mmol/L (ref 10–22)
ANION GAP: 10 mmol/L (ref 10–22)
BUN (UREA NITROGEN): 30 mg/dL — ABNORMAL HIGH (ref 7–18)
BUN (UREA NITROGEN): 31 mg/dL — ABNORMAL HIGH (ref 7–18)
CALCIUM: 8.3 mg/dL — ABNORMAL LOW (ref 8.5–10.5)
CALCIUM: 8.7 mg/dL (ref 8.5–10.5)
CARBON DIOXIDE: 25 mmol/L (ref 21–32)
CARBON DIOXIDE: 27 mmol/L (ref 21–32)
CHLORIDE: 100 mmol/L (ref 98–107)
CHLORIDE: 99 mmol/L (ref 98–107)
CREATININE: 1.6 mg/dL — ABNORMAL HIGH (ref 0.4–1.2)
CREATININE: 1.4 mg/dL — ABNORMAL HIGH (ref 0.4–1.2)
ESTIMATED GLOMERULAR FILT RATE: 38 mL/min — ABNORMAL LOW (ref >60)
ESTIMATED GLOMERULAR FILT RATE: 45 mL/min — ABNORMAL LOW (ref >60)
Glucose Random: 101 mg/dL (ref 74–160)
Glucose Random: 87 mg/dL (ref 74–160)
POTASSIUM: 4.2 mmol/L (ref 3.5–5.1)
POTASSIUM: 4.1 mmol/L (ref 3.5–5.1)
SODIUM: 138 mmol/L (ref 136–145)
SODIUM: 135 mmol/L — ABNORMAL LOW (ref 136–145)

## 2024-09-26 LAB — CBC WITH PLATELET
ABSOLUTE NRBC COUNT: 0 TH/uL (ref 0.0–0.0)
HEMATOCRIT: 40.3 % (ref 34.1–44.9)
HEMOGLOBIN: 13 g/dL (ref 11.2–15.7)
MEAN CORP HGB CONC: 32.3 g/dL (ref 31.0–37.0)
MEAN CORPUSCULAR HGB: 30.9 pg (ref 26.0–34.0)
MEAN CORPUSCULAR VOL: 95.7 fl (ref 80.0–100.0)
MEAN PLATELET VOLUME: 11.5 fL (ref 8.7–12.5)
NRBC %: 0 % (ref 0.0–0.0)
PLATELET COUNT: 207 TH/uL (ref 150–400)
RBC DISTRIBUTION WIDTH STD DEV: 51.8 fL — ABNORMAL HIGH (ref 35.1–46.3)
RED BLOOD CELL COUNT: 4.21 M/uL (ref 3.90–5.20)
WHITE BLOOD CELL COUNT: 6.9 TH/uL (ref 4.0–11.0)

## 2024-09-26 LAB — CREATINE KINASE TOTAL: CREATINE KINASE TOTAL: 468 U/L — ABNORMAL HIGH (ref 26–192)

## 2024-09-26 LAB — ECHOCARDIOGRAM W/ ULTRASOUND CONTRAST: LVEF: 55 %

## 2024-09-26 LAB — PHOSPHORUS MAGNESIUM
MAGNESIUM: 1.6 mg/dL (ref 1.6–2.6)
PHOSPHORUS: 3.2 mg/dL (ref 2.5–4.9)

## 2024-09-26 MED ORDER — MAGNESIUM LACTATE 84 MG (7MEQ) PO TBCR: 84.0000 mg | EXTENDED_RELEASE_TABLET | Freq: Two times a day (BID) | ORAL | Status: DC

## 2024-09-26 MED ORDER — MAGNESIUM SULFATE IN D5W 1-5 GM/100ML-% IV SOLN: 1.0000 g | INTRAVENOUS | Status: DC

## 2024-09-26 MED ORDER — LACTATED RINGERS IV BOLUS
500.0000 mL | Freq: Once | INTRAVENOUS | Status: AC
  Administered 2024-09-26: 500 mL via INTRAVENOUS

## 2024-09-26 MED ORDER — SULFUR HEXAFLUORIDE MICROSPH 60.7-25 MG IJ SUSR: 1.0000 mL | Freq: Once | INTRAMUSCULAR | Status: AC

## 2024-09-26 MED ORDER — SULFUR HEXAFLUORIDE MICROSPH 60.7-25 MG IJ SUSR
INTRAMUSCULAR | Status: AC
  Administered 2024-09-26: 4 mL via INTRAVENOUS
  Filled 2024-09-26: qty 5

## 2024-09-26 NOTE — Progress Notes (Signed)
 Brief Attending Note    89F h/o HFpEF, OSA/OHS, chronic pain on suboxone , schizoaffective d/o bipolar type, spinal stenosis, ?seizure disorder admitted after two falls at home. No LOC, reports in both cases legs gave out when she was trying to transfer (uses wheelchair but is able to stand and transfer). No dizziness, SOB, chest pain. Requested help in setting up home PT - unfortunately, per d/w CM, seems this is not possible at the moment. I do believe she would really benefit from home PT given chronic pain, very limited performance status, and weakness resulting in falls; not clear why she hasn't been able to get home PT. She's understandably frustrated and confused about this since she's been trying to get home PT for years. I expressed to her that I don't know of any medical reason why she can't get home PT and I think she would benefit from it.    Discussed her home medication regimen and I mentioned that she is on numerous sedating meds, primarily for pain and psychiatric comorbidities. However, she states she's been on this regimen for a long time and does not want to change it.    TTE updated and showed no significant changes compared to prior - EF possibly a little lower (55 vs 65) but also suboptimal views.    Mild AKI, prerenal based on FeNa. Cr improved after fluids. Recheck with PCP.    Requested to be discharged this afternoon rather than rechecking BMP again in AM.

## 2024-09-26 NOTE — Care Coordination (Addendum)
 Per [facility] CM Reche M:  Services as follows for Cheryl Zimmerman, I requested evaluation for additional [facility] Hours yesterday.      PCP: [facility]  Personal care 3 hours per week, homemaker 11.5 hours per week via [facility] [employer]  14 meals per week via [employer]  DME: Seated rollator, oximeter, twin mattress cushion, leg elevation pillows, compression stockings, knee brace, rolling walker, raised toilet seat  alone in a split ranch house, no support from friends and family        Addendum: Discuused in MDR. Pt is a 54 y/o female presenting for falls. CM met w/ the pt at bedside but provided only limited info. According to the pt she lives alone in an apartment, no STE, she is independent at baseline on wheelchair, she brought her own wheelchair at bedside, according to her she has an oxygen  concentrator which she left at home. Pt is amenable for VNA if will be recommended. Referrals for skilled nursing and Home PT were sent however VNA's declined to accept. MD updated. Per PT: Pt has no skilled needs.     Pt is possible for dc today to home, no services, will have outpatient f/u this month. Chair car w/ pt's own wheelchair will be booked for 4PM, address verified. Emailed [facility] CM Reche M to request for care partner visit and f/u for VNA referrals outpatient.           09/26/24 1351   Admission   Reviewed for admission Yes   Observation Notice delivered to patient No   Does patient have prescription drug coverage? Yes   Reason for Admission #Falls  #[id number]   Readmission Assessment  No   Mental Status Upon Admission   Mental Status Upon Admission AO   Demographics   Demographic Information Correct Yes   Patient Status   Is the patient own decision-maker? Yes   Is a Social Work consult needed for Health Care Proxy or Guardianship? Yes   Caregiver   Does the patient have a caregiver? No   Psychosocial   Admitted From: Home   Home   (Personal care 3 hours per week, homemaker 11.5 hours per  week via [facility]  14 meals per week via [employer])   Challenges Adjustment to diagnosis/injury/illness;COPD   Facilities Manager Care Provider   Primary Caretaker for Someone No   Providing self care at home? Yes   Functional Screen   Level of function on admission Independent     Mobility Devices Wheelchair   Living Situation   Lives with Alone   Type of Residence House/apartment   Living Setting Apartment   Anticipated Discharge Plan   Expected Discharge Date 09/26/24   Anticipated Outcome VNA   Transportation at Discharge Chair car   Patient expects to be discharged to: Home   Interpreter Requested No   Home Care Services Yes   Type of Home Care Services Homehealth aide;Homemaker;Meals on Wheels;Social work   Warehouse Manager

## 2024-09-26 NOTE — Plan of Care (Signed)
 Pt is AOX4, on RA 02sat:95%, no apparent distress noted, tolerated all po intake well, W/C bound, cont care provided, uncooperative , refused orthostatic BP, MD aware, safety maintained, bed in low position, call light within reach.   Problem: Activity:  Goal: Mobility will be supported throughout the hospitalization,  Outcome: Progressing     Problem: Cognitive:  Goal: Caregivers and patients knowledge of risk factors and measures for prevention of condition will be supported throughout the hospitalization  Outcome: Progressing     Problem: Safety:  Goal: Will remain free from falls throughout the hospitalization,  Outcome: Progressing

## 2024-09-26 NOTE — Discharge Inst - Page 1 Instructions (Signed)
 Nursing:   Assess and monitor mental status and vitals: Notify provider for vitals showing HR above 100 or below 60, SBP above 170 or below 100, Temperature above 38.0C or below 36.0C, or SpO2 less than 90%.     Please ensure patient is taking medications as prescribed.       Physical/Occupational Therapy:   Please ensure patient gets PT evaluation and subsequent treatment.    Transition of Care  Please ensure patient has primary care follow up.

## 2024-09-26 NOTE — Discharge Summary (Signed)
 Physician Discharge Summary     Patient ID:  Name Cheryl Zimmerman  MRN [id number]   Age 54 year old  DOB May 20, 1971    Code Status at Admission: Full Code  Code Status at Discharge: Full Code   Healthcare Proxy and Phone Number: Data Unavailable Data Unavailable    Admit Date: 09/25/24    Discharge Date: 09/26/24    Admitting Physician: Maurilio CHRISTELLA Smock, MD  Discharge Physicians   Intern: Dixie Hammond, MD   Resident: Lauraine Minerva,    Attending: Smock Maurilio CHRISTELLA, MD     Issues for Follow up:   [ ]  AKI - Patient presented with creatinine of 1.7 which improved to 1.4 on day of discharge after fluids. Please repeat a BMP at next appointment.     [ ]  Polypharmacy - Patient is on several sedating medications which may be contributing to gait instability and increased fall risk. Team attempted to discuss discontinuing some agents, but patient was not interested in pursuing this. It would be beneficial for a provider that has a trusted and close relationship with the patient to discuss simplifying her medication regimen.    [ ]  Home PT - Inpatient Case Management attempted to coordinate home PT/VNA care for patient, but several VNA agencies declined patient. Case Management encouraged continuing to purse assistance in outpatient setting.    [ ]  Sleep study - Patient currently on 2L NC at night with known history of OSA. Would likely benefit from formal sleep study and initiation of CPAP    Discharge Diagnoses:   AKI (acute kidney injury) ([name])  Heart Failure with Preserved Ejection Fraction  OSA       Brief Hospital Course:   Patient presented with several falls and resulting increases in pain. Troponin levels were obtained and were flat. EKG showed no overt ischemic changes. Several x-rays were obtained in the ED which revealed no acute fractures. Patient was placed on telemetry for monitoring with no significant events reported.  Echo was obtained which revealed new EF of 55%. Patient declined obtaining orthostatic vital signs  and working with inpatient physical therapy. Inpatient Case Management team attempted to get home PT for patient, but VNA agencies declined. Team asked patient about willingness to simplify medication list given several sedating medications that could be contributing to gait instability, but patient was not interested. Patient also presented with elevated creatinine with labs consistent with pre-renal/dehydration etiology. Creatinine improved with IV fluids.    Relevant Past Medical History:   Past Medical History:  schizo: Bipolar affective ([name])  No date: Chronic systolic CHF (congestive heart failure) ([name])  No date: Depression  12/23/2021: Fall in home  12/23/2021: Gait instability  12/23/2021: Requires continuous at home supplemental oxygen   depress: Schizo affective schizophrenia ([name])  12/23/2021: Spinal stenosis of lumbar 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   claudication  No date: Substance abuse ([name])    Summary of the Presenting Complaint: (From H&P)  [name] first on Saturday trying to move from her toilet to her wheelchair. Hurt her right foot in the process and says she presented to [facility] for evaluation and x-ray was negative for fracture so she was discharged. Notes that at that time she had an elevated temperature and BP. [name] again on Monday. Says she woke up at 3 am on her recliner, had forgotten falling asleep. Tried to hold her urine but accidentally soiled herself. On her way to the bathroom, she fell again. Described it as legs giving out. Said she landed on  her left side and thinks she hit the side of her neck/head. Mild headaches since, no vision changes or changes to sensation. Has been quite sore ever since. Notes that she doesn't really like the taste of water, but has been drinking sparkling water and ginger ale pretty regularly. Endorses stinging with urination, but feels that it improves with more hydration. Denies current fevers, chills, nausea, vomiting, chest pain. Endorses feeling flutters  in her chest which she sometimes gets and attributes it to a heart failure symptom. Also endorses intermittent lightheadedness.    Hospital Course (by problem):   #Falls  #HFpEF EF 65% 03/2022->55% 09/2024  Patient presenting with several falls and resulting increases in pain. Troponin levels were obtained and were flat. EKG showed no overt ischemic changes. Several x-rays were obtained in the ED which revealed no acute fractures. Patient was placed on telemetry for monitoring with no significant events reported. TSH, B12, and folate levels were normal. Echo was obtained which revealed new EF of 55%. Patient declined obtaining orthostatic vital signs and working with inpatient physical therapy. Inpatient Case Management team attempted to get home PT for patient, but VNA agencies declined. Team asked patient about willingness to simplify medication list given several sedating medications that could be contributing to gait instability, but patient was not interested.     #AKI  Patient presented with creatinine of 1.7, up from a baseline around 1. Her BUN:Cr was 19.4 and FeNa was 0.2  at time of admission suggestive of a pre-renal AKI.  Patient was given a total of 1L of LR and her bumex  and spironolactone  were held on day of discharge, instructed to resume 2 days after discharge. Creatinine improved to 1.4 on day of discharge.     Chronic Issues  HLD: Continued Lipitor 80 mg nightly  COPD: Continued Symbicort  twice daily  Hypothyroidism: Continued Synthroid  25 mcg every morning before breakfast  Vitamin D  deficiency - Continued Vitamin D  1000 u daily  GERD - pantoprazole  40 mg was used as omeprazole  not on formulary     Depression/PTSD - continued buspirone , clonazepam , fluoxetine , mirtazapine , olanzapine , quetiapine , prazosin . Attempted to discuss discontinuing some agents with patient, but she was not interested.      Chronic Pain - suboxone , baclofen , gabapentin , tylenol  for tonight. Attempted to discuss  discontinuing some agents with patient, but she was not interested.     Discharge Exam:  BP 118/76   Pulse 89   Temp 97.7 F (36.5 C)   Resp 19   Ht 5' 5 (1.651 m)   Wt (!) 148.3 kg (327 lb)   SpO2 95%   BMI 54.42 kg/m   Weight at Discharge: 148.3 kg  Gen: NAD.  HEENT: PERRLA, EOMI, MMM.   CV: RRR. Normal S1, S2. No m/r/g.  Lungs: Normal work of breathing on 2L NC. CTAB.   Abd: NABS. Soft, NTND.   Ext: Warm. No pitting edema. Strong palpable tibial and pedal pulses  Neuro: A&Ox4.  Intact sensation to light touch in all extremities. 5/5 grip strength bilaterally  Skin: Right foot with echymosis. Large red/purple dried scab over left elbow    Key labs, imaging, other tests:     Chemistries:  Recent Labs     09/25/24  0919 09/25/24  1214 09/26/24  0725 09/26/24  1420   NA 139 141 138 135*   K 3.7 3.7 4.2 4.1   CO2 29 30 25 27    BUN 32* 31* 30* 31*   CREAT 1.7* 1.6* 1.6*  1.4*   CA 8.7 8.3* 8.3* 8.7   MG 1.7  --  1.6  --    PHOS  --   --  3.2  --    ANION 11 11 12 10    GFR 36* 38* 38* 45*    GI:  No results for input(s): AST, ALT, TBILI, DBILI, IBIL, ALKPHOS, GGTP, ALBUMIN, LIP in the last 72 hours.   CBC:  Recent Labs     09/25/24  0919 09/26/24  0700   WBC 8.6 6.9   HGB 14.8 13.0   HCT 45.1* 40.3   PLTA 217 207    Coags:   No results for input(s): INR, PT, APTT, HPTT, FIB in the last 72 hours.   Trops, BNP, D-dimer, Lactate, C-RP:  Recent Labs     09/25/24  0919 09/25/24  1013 09/25/24  1214   TROPTHS 13* 11* 12*   TROPTHSDELTA  --  2 1   PROBNP 87  --   --        Finger Sticks:  Recent Labs     09/25/24  1007   FINGERSTICKR 89    Urinalysis:    Recent Labs     09/25/24  1455 09/25/24  1503   UACOL Yellow DARK YELLOW   UACLA Clear CLEAR   UAGLU Negative NEGATIVE   UABIL Negative NEGATIVE   UAKET  --  NEGATIVE   SPEGRAVURINE  --  1.015   UAOCC Negative NEGATIVE   UAPH 5.5 5.5   UAPRO Trace* NEGATIVE   UABAC Moderate*  --    UANIT Negative NEGATIVE   LEUKOCYTES  --  NEGATIVE         Micro:  Respiratory Panel  Lab Results   Component Value Date    COVID19  09/25/2024     Negative for SARS-CoV-2(2019 novel coronavirus)by PCR  methodology.  Negative results do not preclude 2019-nCoV infection and  should not be used as the sole basis for patient management  decisions. Negative results must be combined with clinical  observations, patient history, and epidemiological  information.      COVID19  12/29/2022     Negative for SARS-CoV-2(2019 novel coronavirus)by PCR  methodology.  Negative results do not preclude 2019-nCoV infection and  should not be used as the sole basis for patient management  decisions. Negative results must be combined with clinical  observations, patient history, and epidemiological  information.  This test has been authorized by the FDA under an Emergency  Use Authorization (EUA) for use by authorized laboratories.      Lab Results   Component Value Date    FLUA Negative for Influenza A by PCR methodology. 09/25/2024    FLUB Negative for influenza B by PCR methodology. 09/25/2024    RSV  09/25/2024     Negative for Respiratory Syncytial Virus by a PCR method.          Culture Data  Blood Cultures  No results found for: [facility]  No results found for: [facility]    Urine Cultures  No results found for: UC    Respiratory Cultures  No results found for: RC Gram Stain  Lab Results   Component Value Date    GS  06/04/2011      GRAM STAIN MODERATE RED BLOOD CELLS FEW POLYMORPHONUCLEAR LEUKOCYTES RARE   TO FEW GRAM POSITIVE COCCI IN PAIRS       Other Cultures:  No results found for: WC  No results found for: [facility]  Other Micro  No results found for: STREPAG  No results found for: MRSASAPCR  No results found for: CDIFFTOX     Echocardiogram w/ Ultrasound Contrast  Result Date: 09/26/2024  Conclusions: (click link below for full report) 1. Technically suboptimal study due to patient's body habitus and supine position. 2. Left Ventricle: There is mild concentric hypertrophy.  Normal LV systolic function. LVEF 55%. No obvious segmental wall motion abnormalities. 3. Right Ventricle: No will image but appears dilated in suboptimal views. RV function could not be assessed. 4. Tricuspid Valve: There is insufficient regurgitation to estimate RVSP. 5. No hemodynamically significant valvular abnormalities. 6. Compared with the prior report from 04/08/2022, there was a technically suboptimal study, but no obvious wall motion abnormalities were appreciated in the current study.    XR Foot Right minimum 3 views  Result Date: 09/25/2024  Exam: Right foot, 3 views Indication: fall Comparison: none Findings: Bones and Joints: There is no acute fracture or suspicious bone lesion. Hardware is seen in the distal tibia and fibula status post ORIF for healed ankle fractures. There is a plantar spur and Achilles enthesophyte on the calcaneus. There is no dislocation. There is a marked hallux valgus deformity of the first ray, and hammertoe deformities of the second through fifth toes Soft Tissues: Unremarkable.       No acute fractures. Hammertoe deformities of the second through fifth toes, and hallux valgus deformity of the first ray. Plantar spur and Achilles enthesophyte on the calcaneus. Status post ORIF with healed ankle fractures demonstrated Reviewed and Electronically Signed By: Slater Lute, MD Signed Date and Time: 09/25/2024 11:45 AM     XR Shoulder Left minimum 2 views  Result Date: 09/25/2024  Exam: Left shoulder, 3 views Indication: fall Comparison: None Findings: Bones and Joints: There is what appears to be a chronic fracture deformity of the greater tuberosity, likely from a Hill-Sachs deformity. There are ossified loose bodies. No obvious acute fracture is seen by plain radiographic technique. The glenohumeral and acromioclavicular joints are preserved. Soft Tissues: Unremarkable.       Chronic appearing fracture deformity of the greater tuberosity, likely an old Hill-Sachs injury. No acute  fractures seen by plain radiographic technique Reviewed and Electronically Signed By: Slater Lute, MD Signed Date and Time: 09/25/2024 11:39 AM     XR Elbow Left minimum 3 views  Result Date: 09/25/2024  Technique: Left elbow, 5 views Indication: fall Comparison: none Findings: Bones and Joints: There is no fracture or suspicious bone lesion. There is a spur on the olecranon process. There is no dislocation. There are ossified loose bodies in the joint space. The joint spaces are otherwise maintained. No joint effusion is seen. Soft Tissues: Unremarkable.       No fracture or effusion Ossified loose bodies in the joint space Olecranon spur Reviewed and Electronically Signed By: Slater Lute, MD Signed Date and Time: 09/25/2024 11:36 AM     XR Ankle Right minimum 3 views  Result Date: 09/25/2024  Technique: Right Ankle, 3 views Indication: fall Comparison: None Findings: Bones and Joints: Plate and screws are seen along a healed distal fibula fracture, and a screw through the medial malleolus is seen through a healed fracture. No acute fracture is appreciated. There is a large plantar spur and a small Achilles enthesophyte on the calcaneus.. There is mild degenerative narrowing of the ankle mortise. No joint effusion is seen. Soft Tissues: Unremarkable         No acute fractures. Healed fractures  of the distal tibia and fibula status post ORIF. Plantar spur and Achilles enthesophyte on the calcaneus Reviewed and Electronically Signed By: Slater Lute, MD Signed Date and Time: 09/25/2024 11:29 AM     XR Chest 2 views  Result Date: 09/25/2024  TECHNIQUE: Chest, 2 views INDICATION: left inframammary chest pain COMPARISON: 08/30/2024, 05/29/2023 FINDINGS: Lungs: There is a patchy right lower lobe opacity. The lungs are otherwise clear Pleura: There is no pleural effusion or pneumothorax. Heart: The cardiac silhouette is unremarkable. Mediastinum/hila: There is aortic calcification. Bones and Soft Tissues: There are degenerative changes of  the thoracic spine.     Patchy right lower lobe opacity Reviewed and Electronically Signed By: Slater Lute, MD Signed Date and Time: 09/25/2024 11:26 AM     XR Chest Portable  Result Date: 08/30/2024  TECHNIQUE: Portable chest, 10:09 a.m. Indication: shortness of breath Comparison: 05/29/2023 and 04/07/2022 FINDINGS: Quality: Satisfactory.  Tubes/lines: None. Lungs: The lungs are clear. Pleura: There is no pleural effusion or pneumothorax. Heart: The cardiac silhouette is unremarkable. Mediastinum/hila: Unremarkable. Bones and Soft Tissues: There is thoracic dextroscoliosis. There is an old right humeral head/neck deformity. A small round metallic object is redemonstrated overlying the lower neck/spine in the midline.      No acute cardiopulmonary findings on portable chest radiograph. Reviewed and Electronically Signed By: Elia Frost, MD Signed Date and Time: 08/30/2024 10:11 AM       Pending Laboratory Work and Tests:   N/A  To reach the [facility] for pending results, call [phone]      Discharge Medication List:     Home Medications      PAUSE taking these medications    bumetanide  2 MG tablet  Wait to take this until: September 28, 2024 Morning  Take 1 tablet by mouth daily  Dispense: 30 tablet  Refill: 0  Commonly known as: BUMEX      spironolactone  100 MG tablet  Wait to take this until: September 28, 2024 Morning  Take 1 tablet by mouth daily  Dispense: 90 tablet  Refill: 3  Commonly known as: ALDACTONE         CHANGE how you take these medications    OLANZapine  5 MG tablet  Take 2 tablets by mouth daily  for 21 days  Dispense: 42 tablet  Refill: 0  Commonly known as: ZYPREXA   What changed:   how much to take  when to take this        CONTINUE taking these medications    acetaminophen  500 MG tablet  Take 2 tablets by mouth every 8 (eight) hours as needed for Pain  Dispense: 180 tablet  Refill: 5  Commonly known as: TYLENOL      atorvastatin  80 MG tablet  Take 1 tablet by mouth daily  Dispense: 90  tablet  Refill: 3  Commonly known as: LIPITOR     baclofen  10 MG tablet  Take 1 tablet by mouth 3 (three) times daily  Dispense: 90 tablet  Refill: 2  Commonly known as: LIORESAL      budesonide -formoterol  160-4.5 MCG/ACT inhaler  Inhale 2 puffs into the lungs in the morning and 2 puffs before bedtime.  Dispense: 6 g  Refill: 2  Commonly known as: SYMBICORT ,BREYNA      buprenorphine -naloxone  8-2 MG sublingual tablet  Place 2 tablets under the tongue in the morning and 2 tablets before   bedtime.  Refill: 0  Commonly known as: SUBOXONE      busPIRone  15 MG tablet  Take 1 tablet by  mouth 3 (three) times daily  Dispense: 90 tablet  Refill: 1  Commonly known as: BUSPAR      cetirizine  10 MG tablet  Take 1 tablet by mouth daily  Dispense: 90 tablet  Refill: 3  Commonly known as: ZYRTEC      cholecalciferol  25 MCG (1000 UT) tablet  Take 1 tablet by mouth daily  Dispense: 90 tablet  Refill: 3  Commonly known as: VITAMIN D3     clonazePAM  1 MG tablet  TAKE 1 TABLET BY MOUTH 2 TIMES DAILY AS NEEDED FOR ANXIETY.  Dispense: 60 tablet  Refill: 2  Doctor's comments: Not to exceed 5 additional fills before 12/10/2024 DX   Code Needed  .  Commonly known as: KLONOPIN      FLUoxetine  40 MG capsule  Take 1 capsule by mouth daily  Dispense: 90 capsule  Refill: 0  Commonly known as: PROzac      fluticasone  50 MCG/ACT nasal spray  2 sprays by Each Nostril route daily  Refill: 0  Commonly known as: FLONASE      gabapentin  800 MG tablet  Take 1 tablet by mouth 3 (three) times daily  Dispense: 90 tablet  Refill: 2  Commonly known as: NEURONTIN      levETIRAcetam  750 MG tablet  Take 750 mg by mouth in the morning and 750 mg before bedtime.  Refill: 0  Commonly known as: KEPPRA      levothyroxine  25 MCG tablet  Take 1 tablet by mouth every morning before breakfast  Dispense: 90 tablet  Refill: 0  Commonly known as: SYNTHROID      magnesium  gluconate 500 MG tablet  Take 1 tablet by mouth in the morning and 1 tablet before bedtime.  Dispense: 60  tablet  Refill: 5  Commonly known as: MAGONATE     Melatonin 5 MG Caps  Take 10 mg by mouth at bedtime  Refill: 0     miconazole  2 % powder  Apply topically as needed for Itching  Dispense: 85 g  Refill: 2  Commonly known as: MICOTIN     mirtazapine  15 MG tablet  Take 1 tablet by mouth nightly  Refill: 0  Commonly known as: REMERON      multivitamin tablet  Take 1 tablet by mouth daily  Dispense: 90 tablet  Refill: 3     nicotine  polacrilex 4 MG gum  Take 1 each by mouth as needed for Craving (Nicotine )  Dispense: 100 tablet  Refill: 11  Commonly known as: NICORETTE      omeprazole  40 MG capsule  Take 1 capsule by mouth daily  Dispense: 90 capsule  Refill: 3  Commonly known as: PriLOSEC     OTHER MEDICATION  Wheelchair: Motorized  For use as directed.    DX: Wheelchair dependent  PRI89:S00.6  Dispense: 1 each  Refill: 0     polyethylene glycol 17 g packet  Take 1 packet by mouth daily  Dispense: 90 packet  Refill: 3  Commonly known as: GLYCOLAX /MIRALAX      prazosin  2 MG capsule  Take 1 capsule by mouth nightly  for 21 days  Dispense: 21 capsule  Refill: 0  Commonly known as: MINIPRESS      QUEtiapine  100 MG tablet  Take 1 tablet by mouth at bedtime  Dispense: 30 tablet  Refill: 2  Commonly known as: SEROquel      sucralfate  1 GM tablet  Take 1 tablet by mouth in the morning and 1 tablet at noon and 1 tablet in   the evening and 1 tablet  before bedtime.  Dispense: 360 tablet  Refill: 3  Commonly known as: CARAFATE      tirzepatide -Weight Management 7.5 MG/0.5ML sc auto-injector  Inject 7.5 mg under the skin once a week  Dispense: 2 mL  Refill: 0  Commonly known as: ZEPBOUND      topiramate  50 MG tablet  Take 1 tablet by mouth in the morning and 1 tablet before bedtime. Do all   this for 21 days.  Dispense: 42 tablet  Refill: 0  Commonly known as: TOPAMAX             Diet: Orders Placed This Encounter      Diet - Base Diet: Regular    Wound Care:   L elbow with dried abraded area, behind L ear also with dry abraded area.  Would be ok to leave OTA given no drainage, however patient requesting dressings for coverage/comfort. L elbow covered with bordered foam and behind L ear with hydrocolloid dsg - dsgs can be changed Q 3 days or as needed.     Discharge Destination: Home      Future appointments:   Current and Future Appointments at [facility] (90 Days) 09/26/2024 - 12/25/2024        Date Visit Type Length Department Provider     10/03/2024  3:30 PM HOSPITAL FOLLOW-UP 30 min [facility] - [address] [[id number]] Noreen Sayres, PA-C    Patient Instructions:                        Follow-up Information    None         To reach medical records at any time call the [facility] operator at [phone]   To reach the covering hospitalist, page [phone]    Signed:  Gurshaan Matsuoka, MD, 09/26/2024

## 2024-09-26 NOTE — Progress Notes (Signed)
 RT Note:    Symbicort  given. Pt able to use without assistance with mouth rinsed after. No distress verbalized. Will continue to follow.     09/26/24 0640   Inhaled Meds   Medications Symbicort    Bilateral Breath Sounds (Pre-TX) Diminished   Location Specific (Pre-TX) No   Dyspnea Scale (Pre Treatment) 0   Pre Treatment O2 SAT % 95 %   Pre TX O2 Delivery Device NC   FiO2 (%) 28 %   O2 Flow Rate (L/min) 2 L/min   Pre-Treatment Pulse 77   Pre-Treatment Respirations 20   Position High fowlers   Treatment Tolerance Tolerated well   Peak Flow   Is PFR Applicable? No   Cough   Cough None

## 2024-09-26 NOTE — Discharge Inst - Reason you came to the hospital (Addendum)
 Dear Barnie Reese:     You came to the hospital for:   Falls    During your hospital stay:  Your labs showed signs of worsening kidney function, likely because of dehydration. You were treated with IV fluids.     After you leave the hospital, please:  Do not take your bumex  or spironolactone  until Saturday. We think this will help your kidneys improve more.     Diet: Regular  Activity: no restrictions    Follow-up:   PCP   Please follow-up with a colleague of your PCP Laymon Flicker PA-C on 10/03/24 at 3:30 PM    Please call your PCP if you develop more dizziness, lightheadedness, swelling in your legs, a new cough, fevers, or chills    Go to the emergency room if you have: chest pain, shortness of breath, another fall or severe symptoms of any kind.    FOR EMERGENCIES CALL [phone]    On behalf of the inpatient medical team, is a pleasure taking care of you! Thank you for allowing us  to do so.     Best wishes,  Your [facility] Team
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
## 2024-09-26 NOTE — Page 1 Discharge - Orders (Signed)
 PATIENT CARE REFERRAL FORM    Patient Demographics:           Name MRN Sex Birthdate Social Security   Cheryl Zimmerman [id number] Female  [birth year]  (54 year old) [id number]   Address Home Phone Insurance ID Marital Status Religion   [address]  [place] [address] [phone] Payor: [facility] [employer] /  /  /   [id number] Divorced None   PCP Admission Date Discharge Date Attending Provider Code Status   [name], MD 09/25/24      09/26/24  Eldonna Maurilio HERO, MD Full Code     Primary Contacts:            Extended Emergency Contact Information  Primary Emergency Contact: healey,mike  Mobile Phone: [phone]  Relation: [employer] Guarantor    Healthcare Proxy: Data Unavailable  Phone Number: Data Unavailable    Discharge:             Discharge to: [facility]  Address:    Has discharge transportation arranged?    Transportation at discharge:      Contact number after discharge:    Best time of day to contact:    Nurse/staff has permission to speak with:      Interpreter requested: No  Family notified of disposition:         Diagnosis:             Isolation:   Infections:     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     (HFpEF) heart failure with preserved ejection fraction ([name])     MDD (major depressive disorder), recurrent, severe, with psychosis ([name])     Requires continuous at home supplemental oxygen      Spinal stenosis of lumbar 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 claudication     Fall in home     Gait instability      Hypokalemia     Lung nodule (12/30/2022)     Stable angina ([name])     Chest pain, atypical     Obesity hypoventilation syndrome ([name])     Osteoarthritis of spine with radiculopathy, lumbar region     Chronic bilateral low back pain with bilateral sciatica     Hypoxia     Alcohol  withdrawal syndrome, uncomplicated ([name])     Cocaine  use     Homicidal ideation     Somnolence     Obstructive sleep apnea syndrome     Acute metabolic encephalopathy     B12 deficiency     Vitamin D  deficiency     H/O total hysterectomy     Thyroid  condition     AKI (acute kidney injury) ([name])     Fall      Review of Patient's Allergies indicates:   Bee venom  Anaphylaxis   Metolazone               Other (See Comments)    Comment:Hypokalemia   Trazodone                Hives   Methylprednisolone      Dizziness, Drowsiness    Comment:Also believes syncope. Seems to tolerate             inhalational and epidural steroid.   Nutritional supplem*       Tegretol [carbamaze*    Hives   Hydroxyzine              Nausea Only   Hospital Administered Immunizations Administered for This Admission  Never Reviewed      No immunizations on file.          Physician Order's: (Include specific orders for Diet, Labs, or Tests)       Page 1 Instructions    Nursing:   Assess and monitor mental status and vitals: Notify provider for vitals showing HR above 100 or below 60, SBP above 170 or below 100, Temperature above 38.0C or below 36.0C, or SpO2 less than 90%.     Please ensure patient is taking medications as prescribed.       Physical/Occupational Therapy:   Please ensure patient gets PT evaluation and subsequent treatment.    Transition of Care  Please ensure patient has primary care follow up.                   Insulin Instructions  No orders of the defined types were placed in this encounter.           Home Medications      CONTINUE taking these medications    acetaminophen  500 MG tablet  Take 2 tablets by mouth every 8 (eight) hours as needed for  Pain  Dispense: 180 tablet  Refill: 5  Commonly known as: TYLENOL      atorvastatin  80 MG tablet  Take 1 tablet by mouth daily  Dispense: 90 tablet  Refill: 3  Commonly known as: LIPITOR     baclofen  10 MG tablet  Take 1 tablet by mouth 3 (three) times daily  Dispense: 90 tablet  Refill: 2  Commonly known as: LIORESAL      budesonide -formoterol  160-4.5 MCG/ACT inhaler  Inhale 2 puffs into the lungs in the morning and 2 puffs before bedtime.  Dispense: 6 g  Refill: 2  Commonly known as: SYMBICORT ,BREYNA      bumetanide  2 MG tablet  Take 1 tablet by mouth daily  Dispense: 30 tablet  Refill: 0  Commonly known as: BUMEX      buprenorphine -naloxone  8-2 MG sublingual tablet  Place 2 tablets under the tongue in the morning and 2 tablets before   bedtime.  Refill: 0  Commonly known as: SUBOXONE      busPIRone  15 MG tablet  Take 1 tablet by mouth 3 (three) times daily  Dispense: 90 tablet  Refill: 1  Commonly known as: BUSPAR      cetirizine  10 MG tablet  Take 1 tablet by mouth daily  Dispense: 90 tablet  Refill: 3  Commonly known as: ZYRTEC      cholecalciferol  25 MCG (1000 UT) tablet  Take 1 tablet by mouth daily  Dispense: 90 tablet  Refill: 3  Commonly known as: VITAMIN D3     clonazePAM  1 MG tablet  TAKE 1 TABLET BY MOUTH 2 TIMES DAILY AS NEEDED FOR ANXIETY.  Dispense: 60 tablet  Refill:  2  Doctor's comments: Not to exceed 5 additional fills before 12/10/2024 DX   Code Needed  .  Commonly known as: KLONOPIN      FLUoxetine  40 MG capsule  Take 1 capsule by mouth daily  Dispense: 90 capsule  Refill: 0  Commonly known as: PROzac      fluticasone  50 MCG/ACT nasal spray  2 sprays by Each Nostril route daily  Refill: 0  Commonly known as: FLONASE      gabapentin  800 MG tablet  Take 1 tablet by mouth 3 (three) times daily  Dispense: 90 tablet  Refill: 2  Commonly known as: NEURONTIN      levETIRAcetam  750 MG tablet  Take 750 mg by mouth in the morning and 750 mg before bedtime.  Refill: 0  Commonly known as: KEPPRA      levothyroxine  25  MCG tablet  Take 1 tablet by mouth every morning before breakfast  Dispense: 90 tablet  Refill: 0  Commonly known as: SYNTHROID      magnesium  gluconate 500 MG tablet  Take 1 tablet by mouth in the morning and 1 tablet before bedtime.  Dispense: 60 tablet  Refill: 5  Commonly known as: MAGONATE     Melatonin 5 MG Caps  Take 10 mg by mouth at bedtime  Refill: 0     miconazole  2 % powder  Apply topically as needed for Itching  Dispense: 85 g  Refill: 2  Commonly known as: MICOTIN     mirtazapine  15 MG tablet  Take 1 tablet by mouth nightly  Refill: 0  Commonly known as: REMERON      multivitamin tablet  Take 1 tablet by mouth daily  Dispense: 90 tablet  Refill: 3     nicotine  polacrilex 4 MG gum  Take 1 each by mouth as needed for Craving (Nicotine )  Dispense: 100 tablet  Refill: 11  Commonly known as: NICORETTE      OLANZapine  5 MG tablet  Take 2 tablets by mouth daily  for 21 days  Dispense: 42 tablet  Refill: 0  Commonly known as: ZYPREXA      omeprazole  40 MG capsule  Take 1 capsule by mouth daily  Dispense: 90 capsule  Refill: 3  Commonly known as: PriLOSEC     OTHER MEDICATION  Wheelchair: Motorized  For use as directed.    DX: Wheelchair dependent  PRI89:S00.6  Dispense: 1 each  Refill: 0     polyethylene glycol 17 g packet  Take 1 packet by mouth daily  Dispense: 90 packet  Refill: 3  Commonly known as: GLYCOLAX /MIRALAX      prazosin  2 MG capsule  Take 1 capsule by mouth nightly  for 21 days  Dispense: 21 capsule  Refill: 0  Commonly known as: MINIPRESS      QUEtiapine  100 MG tablet  Take 1 tablet by mouth at bedtime  Dispense: 30 tablet  Refill: 2  Commonly known as: SEROquel      spironolactone  100 MG tablet  Take 1 tablet by mouth daily  Dispense: 90 tablet  Refill: 3  Commonly known as: ALDACTONE      sucralfate  1 GM tablet  Take 1 tablet by mouth in the morning and 1 tablet at noon and 1 tablet in   the evening and 1 tablet before bedtime.  Dispense: 360 tablet  Refill: 3  Commonly known as: CARAFATE       tirzepatide -Weight Management 7.5 MG/0.5ML sc auto-injector  Inject 7.5 mg under the skin once a week  Dispense: 2 mL  Refill: 0  Commonly known as: ZEPBOUND      topiramate  50 MG tablet  Take 1 tablet by mouth in the morning and 1 tablet before bedtime. Do all   this for 21 days.  Dispense: 42 tablet  Refill: 0  Commonly known as: TOPAMAX           Palliative Care Referral  No orders of the defined types were placed in this encounter.    Hospice Referral  No orders of the defined types were placed in this encounter.    Discharge Supplies:   No discharge procedures on file.        Follow-up Information    None       Current and Future Appointments at [facility] (90 Days) 09/26/2024 - 12/25/2024        Date Visit Type Length Department Provider     10/03/2024  3:30 PM HOSPITAL FOLLOW-UP 30 min [facility] - [address] [[id number]] Nestor, Meghan, PA-C    Patient Instructions:                          Physical Therapy:              Weight Bearing Status LRE:    Weight Bearing Status LLE:      Frequency:    Recommendation:      Recommended Equipment:    Patient Stated Goals:    Prognosis:      Home Health Services:           Home Health Services:      CERTIFICATION (When Applicable):         I certify that the above named patient is: Under my care (or has been referred to another physician having professional knowledge of patient's condition); is home bound except when receiving outpatient services; requires skilled nursing care on an intermittent basis or physical or speech therapy as sepcified in the orders.    Who will follow this patient after discharge: [name], MD       Electronically signed by:  Lauraine Minerva, MD
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
## 2024-09-28 LAB — EKG

## 2024-09-30 ENCOUNTER — Telehealth (HOSPITAL_BASED_OUTPATIENT_CLINIC_OR_DEPARTMENT_OTHER): Payer: Self-pay

## 2024-09-30 ENCOUNTER — Encounter (HOSPITAL_BASED_OUTPATIENT_CLINIC_OR_DEPARTMENT_OTHER): Payer: Self-pay | Admitting: Registered Nurse

## 2024-09-30 NOTE — Telephone Encounter (Addendum)
 RE: Calls today requesting to speak to a nurse right away because will not be able to answer the phone if a nurse would have to call her back. States has a lot of swelling in legs and hands from recent fall. Stating is worried that leg looks deformed from the swelling. Transferred patient to [facility]. Scheduled ED follow up which patient states will call to schedule transportation.    Patient contacted and identified.     I'd like to be seen sooner; I have an appt next week. I was recently in the hospital at [facility] d/t being misdiagnosed at [facility] for my arm, neck, and foot. I have a lump on my neck, my arm is itchy and still really sore. My foot R is still swollen but the bruising is starting to fade. I need ad least 72 hours to set up transportation. Hospital discharge appt rescheduled as requested to 10/03/24.      DISPO: Patient agrees with plan and disposition and pt is with no further questions or concerns at this time.      Crews Mccollam Colon-Guerrero, RN, 09/30/2024

## 2024-09-30 NOTE — Telephone Encounter (Signed)
 Annaya Bangert [id number], 54 year old, female   Telephone Information:   Home Phone [phone]   Work Phone Not on file.   Mobile [phone]       Calls today: Other: [facility]-PT Patient is still waiting for home PT. Referral on file: REFERRAL TO VISITING NURSE SERVICES (EXT)     Person calling on behalf of patient: who is calling: Patient (self)    CALL BACK NUMBER: [phone]  Best time to call back: Anytime      Patient's language of care: English    Patient does not need an interpreter.    Patient's PCP: [name], MD    Primary Care Home Site:  [facility]     Thanks  Rosaria Curls, 09/30/2024

## 2024-09-30 NOTE — Telephone Encounter (Signed)
 Admit Date: 09/25/24         D/C date:  09/26/24     Discharge Diagnoses:   AKI (acute kidney injury) ([name])  Heart Failure with Preserved Ejection Fraction  OSA       Hospitalization summary:  Patient presented with several falls and resulting increases in pain. Troponin levels were obtained and were flat. EKG showed no overt ischemic changes. Several x-rays were obtained in the ED which revealed no acute fractures. Patient was placed on telemetry for monitoring with no significant events reported.  Echo was obtained which revealed new EF of 55%. Patient declined obtaining orthostatic vital signs and working with inpatient physical therapy. Inpatient Case Management team attempted to get home PT for patient, but VNA agencies declined. Team asked patient about willingness to simplify medication list given several sedating medications that could be contributing to gait instability, but patient was not interested. Patient also presented with elevated creatinine with labs consistent with pre-renal/dehydration etiology. Creatinine improved with IV fluids.     Issues for follow up after discharge:  [ ]  AKI - Patient presented with creatinine of 1.7 which improved to 1.4 on day of discharge after fluids. Please repeat a BMP at next appointment.      [ ]  Polypharmacy - Patient is on several sedating medications which may be contributing to gait instability and increased fall risk. Team attempted to discuss discontinuing some agents, but patient was not interested in pursuing this. It would be beneficial for a provider that has a trusted and close relationship with the patient to discuss simplifying her medication regimen.     [ ]  Home PT - Inpatient Case Management attempted to coordinate home PT/VNA care for patient, but several VNA agencies declined patient. Case Management encouraged continuing to purse assistance in outpatient setting.     [ ]  Sleep study - Patient currently on 2L NC at night with known history of OSA.  Would likely benefit from formal sleep study and initiation of CPAP     Patient reports the following regarding their current condition (stable, worse, better): requests a sooner discharge appointment. I feel the swelling to my R foot is worse.      Medication review completed:  Medication changes, if any: changes reviewed   Are there any prescription medication concerns:  denies      Pain/discomfort assessment: denies      Follow up:  Scheduled appointment(s) with PCP and or relevant specialist(s) as follows: follow up appointment rescheduled as requested.     Other plan of care considerations:  pt needs 72 hours in advance to r/s transportation: 72 hours provided. Pt advised to call transportation now, before 2:00 pm.       Pt verbalized good understanding and agrees with plan as discussed.

## 2024-09-30 NOTE — Telephone Encounter (Signed)
 RE: Calls today requesting to speak to a nurse right away because will not be able to answer the phone if a nurse would have to call her back. States has a lot of swelling in legs and hands from recent fall. Stating is worried that leg looks deformed from the swelling. Transferred patient to [facility]. Scheduled ED follow up which patient states will call to schedule transportation.    Failed attempt to contact patient. Unable to LVM with clinic contact info requesting a call back. Mailbox full. RN to continue to outreach.     Deniese Oberry Colon-Guerrero, RN, 09/30/2024

## 2024-09-30 NOTE — Telephone Encounter (Signed)
 [facility] - [address]    Cheryl Zimmerman [id number], 54 year old, female,   Telephone Information:   Home Phone [phone]   Work Phone Not on file.   Mobile [phone]     Patient's Preferred Pharmacy:     [id number] [place], [place] - [address]  Phone: [phone] Fax: [phone]    CONFIRMED TODAY: Chaney [name] NUMBER: [phone]  Best time to call back: Anytime    Patient's language of care: English    Patient does not need an interpreter.    Patient's PCP: [name], MD    Person calling on behalf of patient: who is calling: Patient (self)    Calls today requesting to speak to a nurse right away because will not be able to answer the phone if a nurse would have to call her back. States has a lot of swelling in legs and hands from recent fall. Stating is worried that leg looks deformed from the swelling. Transferred patient to [facility]. Scheduled ED follow up which patient states will call to schedule transportation.

## 2024-10-01 ENCOUNTER — Telehealth (HOSPITAL_BASED_OUTPATIENT_CLINIC_OR_DEPARTMENT_OTHER): Payer: Self-pay

## 2024-10-01 NOTE — Telephone Encounter (Signed)
 Call to pt and discuss Dr. Aundra will see pt on Thursday 10/03/24 at [facility] clinic.  Pt verbalizes understanding.     Dr. Rubie will see her at 10am, I spoke to her about it.       - Meghan

## 2024-10-01 NOTE — Telephone Encounter (Signed)
 TC to patient re: PT referral update     Patient contacted and identified.    Advised this RN discussed sending PT referral to [facility], as pt was seen by [facility] before. Referral and accompanying paperwork was sent today. If they are willing to see me in the home, I will work with [facility]. If not, could you send the referral to [facility]. I have to go see them in the office but I've been waiting for a long time and I'd like to get the electric wheelchair.    PLAN:   -advised referral to [facility] sent today. This RN will f/up if pt is accepted or not  -if not accepted, will attempts next request to [facility], # enclosed, provided by pt    -advised delay is the inability to find a company that can accept pt as a client  -this RN will continue outreaching to pt to inform/update     DISPO: Patient agrees with plan and disposition and pt is with no further questions or concerns at this time.      [name], RN, 10/01/2024        Calls today: Other: Patient can not wait for the home PT. She's been waiting for too long with no follow up. She wants to go to [facility] and would like the prescription to be faxed to [facility] at [phone].

## 2024-10-01 NOTE — Telephone Encounter (Signed)
 Referral update discussed with pt in TC dated 08/12/24. Please see encounter for updates.

## 2024-10-01 NOTE — Telephone Encounter (Signed)
 Cheryl Zimmerman [id number], 54 year old, female   Telephone Information:   Home Phone [phone]   Work Phone Not on file.   Mobile [phone]       Calls today: VNA/Nursing Home/Other Agency: non urgent call    Name of [facility] - [facility]  Name of person calling - [name]  Reason for call - going to ER for left elbow infected wound  Return phone number [phone]    NOTE: Urgent calls are a warm handoff       Person calling on behalf of patient: who is calling: [facility]    CALL BACK NUMBER: [phone]  Best time to call back:   Cell phone:   Other phone:    Patient's language of care: English    Patient does not need an interpreter.    Patient's PCP: [name], MD    Primary Care Home Site:  [facility]

## 2024-10-01 NOTE — Telephone Encounter (Signed)
 Spoke with [name], nurse from [facility]  Pt has stage 2 ulcer to left elbow measuring 3cm x 5cm  Necrotic center, purulent, Induration, Erythematous   Bandage with xeroform   Concern for Cellulitis   No criteria for sepsis.  No fever, tachycardia, hypotension, altered mental status.  No elevated lactate. Lactate level today 1.8  Pt only wants to go to [facility]. Refuses to go to ER near her due to bad experience.   Tried to get private Ambulance to [facility].  Unable to arrange transportation.    1x dose of ceftriaxone 1 gm IV given to pt today.  Pt taking Doxycycline  BID until upcoming appt    Bumux restarted 2 mg tablet daily. ER forgot to let pt know to restart on 09/28/24.  Renal function stable.  Creatine 0.83 and BUN 9 today.    [facility] nurse will visit pt tomorrow to assess and make sure wound/ulcer is not worsening.    Pt has in person appt scheduled with Meghan Nestor on Thursday 10/03/24 at 2pm  After provider evaluation pt can go to [facility] ER if needed.  Pt's ride is scheduled at 8am and pt hoping provider can see her around 10am.   Discuss pt's appt this Thursday is at 2pm. But will route message to Meghan Nestor, PA-C if able to see her earlier as pt is unable to change her ride.  [name] verbalizes understanding    Cheryl Zimmerman, [place] 10/01/24 2:53 PM

## 2024-10-03 ENCOUNTER — Other Ambulatory Visit: Payer: Self-pay

## 2024-10-03 ENCOUNTER — Ambulatory Visit

## 2024-10-03 ENCOUNTER — Ambulatory Visit: Admitting: Physician Assistant

## 2024-10-03 ENCOUNTER — Encounter (HOSPITAL_BASED_OUTPATIENT_CLINIC_OR_DEPARTMENT_OTHER): Payer: Self-pay

## 2024-10-03 ENCOUNTER — Emergency Department: Admission: EM | Admit: 2024-10-03 | Discharge: 2024-10-03 | Disposition: A | Discharge status: Home / Self Care

## 2024-10-03 VITALS — BP 149/96 | HR 72 | Temp 98.8°F | Wt 327.0 lb

## 2024-10-03 DIAGNOSIS — L89022 Pressure ulcer of left elbow, stage 2: Secondary | ICD-10-CM | POA: Diagnosis present

## 2024-10-03 DIAGNOSIS — N179 Acute kidney failure, unspecified: Secondary | ICD-10-CM | POA: Insufficient documentation

## 2024-10-03 DIAGNOSIS — Z09 Encounter for follow-up examination after completed treatment for conditions other than malignant neoplasm: Secondary | ICD-10-CM | POA: Insufficient documentation

## 2024-10-03 DIAGNOSIS — R296 Repeated falls: Secondary | ICD-10-CM | POA: Diagnosis present

## 2024-10-03 DIAGNOSIS — L03114 Cellulitis of left upper limb: Secondary | ICD-10-CM | POA: Diagnosis present

## 2024-10-03 DIAGNOSIS — X58XXXA Exposure to other specified factors, initial encounter: Secondary | ICD-10-CM | POA: Diagnosis not present

## 2024-10-03 DIAGNOSIS — S51002A Unspecified open wound of left elbow, initial encounter: Secondary | ICD-10-CM | POA: Diagnosis present

## 2024-10-03 LAB — CBC, PLATELET & DIFFERENTIAL
ABSOLUTE BASO COUNT: 0 TH/uL (ref 0.0–0.1)
ABSOLUTE EOSINOPHIL COUNT: 0.2 TH/uL (ref 0.0–0.8)
ABSOLUTE IMM GRAN COUNT: 0.04 TH/uL (ref 0.00–0.10)
ABSOLUTE LYMPH COUNT: 2.9 TH/uL (ref 0.6–5.9)
ABSOLUTE MONO COUNT: 0.5 TH/uL (ref 0.2–1.4)
ABSOLUTE NEUTROPHIL COUNT: 4.5 TH/uL (ref 1.6–8.3)
ABSOLUTE NRBC COUNT: 0 TH/uL (ref 0.0–0.0)
BASOPHIL %: 0.4 % (ref 0.0–1.2)
EOSINOPHIL %: 2.2 % (ref 0.0–7.0)
HEMATOCRIT: 43.6 % (ref 34.1–44.9)
HEMOGLOBIN: 13.8 g/dL (ref 11.2–15.7)
IMMATURE GRANULOCYTE %: 0.5 % (ref 0.0–1.0)
LYMPHOCYTE %: 34.9 % (ref 15.0–54.0)
MEAN CORP HGB CONC: 31.7 g/dL (ref 31.0–37.0)
MEAN CORPUSCULAR HGB: 31.4 pg (ref 26.0–34.0)
MEAN CORPUSCULAR VOL: 99.1 fl (ref 80.0–100.0)
MEAN PLATELET VOLUME: 10.8 fL (ref 8.7–12.5)
MONOCYTE %: 6.6 % (ref 4.0–13.0)
NEUTROPHIL %: 55.4 % (ref 40.0–75.0)
NRBC %: 0 % (ref 0.0–0.0)
PLATELET COUNT: 332 TH/uL (ref 150–400)
RBC DISTRIBUTION WIDTH STD DEV: 55 fL — ABNORMAL HIGH (ref 35.1–46.3)
RED BLOOD CELL COUNT: 4.4 M/uL (ref 3.90–5.20)
WHITE BLOOD CELL COUNT: 8.2 TH/uL (ref 4.0–11.0)

## 2024-10-03 LAB — BASIC METABOLIC PANEL
ANION GAP: 10 mmol/L (ref 10–22)
BUN (UREA NITROGEN): 13 mg/dL (ref 7–18)
CALCIUM: 9.7 mg/dL (ref 8.5–10.5)
CARBON DIOXIDE: 27 mmol/L (ref 21–32)
CHLORIDE: 103 mmol/L (ref 98–107)
CREATININE: 1.1 mg/dL (ref 0.4–1.2)
ESTIMATED GLOMERULAR FILT RATE: 60 mL/min (ref >60)
Glucose Random: 84 mg/dL (ref 74–160)
POTASSIUM: 4.3 mmol/L (ref 3.5–5.1)
SODIUM: 140 mmol/L (ref 136–145)

## 2024-10-03 LAB — HOLD BLUE TOP TUBE

## 2024-10-03 LAB — HOLD GREEN TOP TUBE

## 2024-10-03 MED ORDER — DOXYCYCLINE HYCLATE 100 MG PO TABS: 100.0000 mg | ORAL_TABLET | Freq: Two times a day (BID) | ORAL | 0 refills | Status: AC

## 2024-10-03 MED ORDER — COLLAGENASE 250 UNIT/GM EX OINT: TOPICAL_OINTMENT | Freq: Every day | CUTANEOUS | 5 refills | Status: DC

## 2024-10-03 NOTE — Event Note (Signed)
 Patient seen and evaluated by vascular surgery team. Discussed with Dr. Kassavin who recommends Orthopedic consult given location of wound.    Samule Dus, PA-C  [phone]

## 2024-10-03 NOTE — Progress Notes (Signed)
 PRIMARY CARE NOTE    SUBJECTIVE  Cheryl Zimmerman is a 54 year old English-speaking female here for:    Hospital follow up:  Admitted at [facility] 1/7-09/26/24    Presented after several falls. Cardiac monitoring overnight was overall reassuring. Xrays negative for fractures. She did have an AKI suspected to be pre-renal which improved with fluids and had resolved on outpatient labs with VNA. Now restarted on her home bumex     Patient declined adjustment of potentially sedating meds to reduce future fall risk. Patient declined inpatient PT and case management was unable to arrange outpatient PT on discharge. Now interested in referral to [facility] team      Pressure ulcer on left arm:  Present during admission, seen by wound care nurse  After discharge VNA concerned for infection: purulent, indurated, erythematous  No fever, tachycardia, hypotension, altered mental status.  No elevated lactate  She declined to go to local ED, preferred to be seen at [facility] and sent to [facility] ED if needed  Got 1 dose CTX 1gm IV 1/13, and has taken doxy since then  She feels it is getting worse despite adherence to abx  Felt chills this am, took tylenol           ROS - Pertinent symptoms as noted above, other ROS negative    Active Medical Issues:  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     (HFpEF) heart failure with preserved ejection fraction ([name])     MDD (major depressive disorder), recurrent, severe, with psychosis ([name])     Requires continuous at home  supplemental oxygen      Spinal stenosis of lumbar 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 claudication     Fall in home     Gait instability     Hypokalemia     Lung nodule (12/30/2022)     Stable angina ([name])     Chest pain, atypical     Obesity hypoventilation syndrome ([name])     Osteoarthritis of spine with radiculopathy, lumbar region     Chronic bilateral low back pain with bilateral sciatica     Hypoxia     Alcohol  withdrawal syndrome, uncomplicated ([name])     Cocaine  use     Homicidal ideation     Somnolence     Obstructive sleep apnea syndrome     Acute metabolic encephalopathy     B12 deficiency     Vitamin D  deficiency     H/O total hysterectomy     Thyroid  condition     AKI (acute kidney injury) ([name])     Fall        OBJECTIVE  BP (!) 149/96 (Site: RA)   Pulse 72   Wt (!) 148.3 kg (327 lb)   SpO2 97%   BMI 54.42 kg/m     Physical Exam  Vitals reviewed.   HENT:  Head: Normocephalic and atraumatic.   Pulmonary:      Effort: Pulmonary effort is normal. No respiratory distress.   Skin:     Comments: Left elbow pressure ulcer with surrounding erythema which appears larger than prior images. Induration extending over the forearm, warm and tender to touch. No active purulence. Pictures added to media   Neurological:      Mental Status: She is alert.      Gait: Gait is intact.           I personally reviewed relevant labs, imaging, and studies in Epic.      ASSESSMENT AND PLAN  Cheryl Zimmerman is a 54 year old female who presents for issues outlined below    (Z09) Hospital discharge follow-up  (primary encounter diagnosis)  (N17.9) AKI (acute kidney injury) ([name])  (R29.6) Frequent falls  Comment: resolved AKI, now back on home diuretic. Interested in outpatient PT at [facility]  Plan: referral to [facility] PT    (L89.022) Pressure injury of left elbow, stage 2 ([name])  (L03.114) Cellulitis of left elbow  Comment: concern for cellulitis uncontrolled with outpatient oral antibiotics with persistent/worsening erythema, swelling, warmth,  pain, induration. Warrants further evaluation/possible inpatient admission for IV antibiotics. Vitals reassuring against sepsis  Plan: ED evaluation          Valery Kale, MD

## 2024-10-03 NOTE — ED Notes (Signed)
Bed: 20-A  Expected date:   Expected time:   Means of arrival:   Comments:  [facility]

## 2024-10-03 NOTE — Narrator Note (Signed)
Wound RN at bedside. 

## 2024-10-03 NOTE — ED Triage Note (Signed)
 Pt arrives by [facility] from home with wound to L elbow s/p fall one week ago, pain edema and drainage reported. Pt received dose if IV antibiotics yesterday and currently taking PO doxycycline . PT denies fever but reports chills, tylenol  taken at 0900 this morning. Pt wheelchair bound at baseline, able to stand and pivot from EMS stretcher.

## 2024-10-03 NOTE — Progress Notes (Signed)
 Was asked by MD Rubie to transfer pt to Hospital for concern of wound infection  Left elbow wound  Sustained wound from fall that happened  over a week ago  Currently on PO antibiotic  Unstageable  wound noticed on left elbow  Erythema to perimeter of wound, warm and tender to touch  Body temp: 98.73F  Non adherent dressing applied followed by Kerlix wrap  EMS in and taking pt to [facility], [place], 10/03/2024

## 2024-10-03 NOTE — Narrator Note (Signed)
 Patient Disposition  Patient education for diagnosis, medications, activity, diet and follow-up.  Patient left ED 4:07 PM.  Patient rep received written instructions.    Interpreter to provide instructions: No    Patient belongings with patient: YES    Have all existing LDAs been addressed? Yes    Have all IV infusions been stopped? N/A    Destination: Discharged to home, medications, d/c instructions and appropriate f/u's reviewed. Pt aware when to return to ED if needed. Pt verbalizes understanding and agrees with POC.

## 2024-10-03 NOTE — Narrator Note (Signed)
 Food tray provided to pt;

## 2024-10-03 NOTE — ED Provider Notes (Signed)
 Patient's mode of arrival was by [facility].  Arrival time:     Chief complaint: Wound Evaluation          Triage Vital Signs:  ED Triage Vitals [10/03/24 1119]   ED Triage Vitals Brief Group      Temp 97.2 F      Pulse 66      Resp 18      BP 135/80      SpO2 96 %      Pain Score 9      ED Patient Expect:  Call In Referral  Reason for Visit: Worsening left arm cellulitis x 2 days -started with ceftriaxone now on doxycycline   Referring Provider Affiliation: [facility]  Referring Provider: dr dimitri    Triage Documentation       Fleeta Moon, Miranda R, RN 10/03/2024 11:22                 Pt arrives by [facility] from home with wound to L elbow s/p fall one week ago, pain edema and drainage reported. Pt received dose if IV antibiotics yesterday and currently taking PO doxycycline . PT denies fever but reports chills, tylenol  taken at 0900 this morning. Pt wheelchair bound at baseline, able to stand and pivot from EMS stretcher.           HPI:  54 year old female patient presents for evaluation of left elbow wound.  Several prior falls, wheelchair-bound, chronic back pain, gait instability.  Had x-rays without fracture, deep abrasion to the left elbow.  Was seen for a deep elbow abrasion.    Followed up today with PCP and with concern for worsening symptoms was sent to the ER for evaluation.    Ceftriaxone 1 g 2 days ago, and has been on twice daily Doxy.  Reports pain, using Suboxone  and gabapentin  and other medicines at baseline      Past Medical History:  Past Medical History:  schizo: Bipolar affective ([name])  No date: Chronic systolic CHF (congestive heart failure) ([name])  No date: Depression  12/23/2021: Fall in home  12/23/2021: Gait instability  12/23/2021: Requires continuous at home supplemental oxygen   depress: Schizo affective schizophrenia ([name])  12/23/2021: Spinal stenosis of lumbar 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   claudication  No date: Substance abuse ([name])    Immunization History:  Immunization History   Administered Date(s)  Administered    Covid-19 Vaccine (Moderna - Full Dose) 02/03/2020, 04/15/2020    Hep A Pedi/Adolescent 2 Doses AGE 04/04/2015    INFLUENZA VIRUS TRI W/PRESV VACCINE 18/> YRS IM (PRIVATE) 06/02/2011, 08/09/2022    Influenza Trivalent Preservative Free 0.5 mL 32m+ 06/14/2023    Influenza Vac Quad (Aiiv4) inact, Adjuv Pre Free 0.23ml 06/27/2015    Influenza Vaccine High Dose 0.15ml 65 And Older 06/02/2011, 06/27/2015, 01/25/2021, 06/03/2021, 08/09/2022    Influenza Virus Quad Presv Free Vacc 6 Mo and Older, IM 01/25/2021, 06/03/2021    Influenza, Unspecified Formulation 05/20/2010, 06/09/2010, 07/15/2011    Moderna >12yo Covid-19 (Spikevax) 06/14/2023, 06/05/2024    PNEUMOCOCCAL POLYSACCHARIDE VACCINE v23 05/20/2010    Pfizer > 12 yo Covid-19 Vaccine 11/22/2022    Pneumococcal 20-(prevnar 20) 11/11/2022    Td 05/12/2005    Tdap 11/03/2021, 07/15/2022    influenza, recombinant, injectable, preservative free 06/05/2024       Past Surgical History:  No past surgical history on file.    Medications:    No current facility-administered medications for this encounter.     Current Outpatient Medications  Medication Sig    collagenase  (SANTYL ) ointment Apply topically daily    doxycycline  (VIBRA -TABS) 100 MG tablet Take 1 tablet by mouth in the morning and 1 tablet before bedtime. Do all this for 7 days.    buprenorphine -naloxone  (SUBOXONE ) 8-2 MG sublingual tablet Place 2 tablets under the tongue in the morning and 2 tablets before bedtime.    levETIRAcetam  (KEPPRA ) 750 MG tablet Take 750 mg by mouth in the morning and 750 mg before bedtime.    tirzepatide -Weight Management (ZEPBOUND ) 7.5 MG/0.5ML sc auto-injector Inject 7.5 mg under the skin once a week    bumetanide  (BUMEX ) 2 MG tablet Take 1 tablet by mouth daily    busPIRone  (BUSPAR ) 15 MG tablet Take 1 tablet by mouth 3 (three) times daily    levothyroxine  (SYNTHROID ) 25 MCG tablet Take 1 tablet by mouth every morning before breakfast    FLUoxetine  (PROZAC ) 40 MG capsule  Take 1 capsule by mouth daily    magnesium  gluconate (MAGONATE) 500 MG tablet Take 1 tablet by mouth in the morning and 1 tablet before bedtime.    baclofen  (LIORESAL ) 10 MG tablet Take 1 tablet by mouth 3 (three) times daily    miconazole  (MICOTIN) 2 % powder Apply topically as needed for Itching    gabapentin  (NEURONTIN ) 800 MG tablet Take 1 tablet by mouth 3 (three) times daily    clonazePAM  (KLONOPIN ) 1 MG tablet TAKE 1 TABLET BY MOUTH 2 TIMES DAILY AS NEEDED FOR ANXIETY.    QUEtiapine  (SEROQUEL ) 100 MG tablet Take 1 tablet by mouth at bedtime    topiramate  (TOPAMAX ) 50 MG tablet Take 1 tablet by mouth in the morning and 1 tablet before bedtime. Do all this for 21 days.    prazosin  (MINIPRESS ) 2 MG capsule Take 1 capsule by mouth nightly  for 21 days    OLANZapine  (ZYPREXA ) 5 MG tablet Take 2 tablets by mouth daily  for 21 days (Patient taking differently: Take 5 mg by mouth in the morning and 5 mg before bedtime.)    acetaminophen  (TYLENOL ) 500 MG tablet Take 2 tablets by mouth every 8 (eight) hours as needed for Pain    cetirizine  (ZYRTEC ) 10 MG tablet Take 1 tablet by mouth daily    OTHER MEDICATION Wheelchair: Motorized  For use as directed.    DX: Wheelchair dependent  PRI89:S00.6    mirtazapine  (REMERON ) 15 MG tablet Take 1 tablet by mouth nightly    cholecalciferol  (VITAMIN D3) 25 MCG (1000 UT) tablet Take 1 tablet by mouth daily    fluticasone  (FLONASE ) 50 MCG/ACT nasal spray 2 sprays by Each Nostril route daily    Melatonin 5 MG CAPS Take 10 mg by mouth at bedtime    atorvastatin  (LIPITOR) 80 MG tablet Take 1 tablet by mouth daily    budesonide -formoterol  (SYMBICORT ,BREYNA ) 160-4.5 MCG/ACT inhaler Inhale 2 puffs into the lungs in the morning and 2 puffs before bedtime.    Multiple Vitamin (MULTIVITAMIN) tablet Take 1 tablet by mouth daily    nicotine  polacrilex (NICORETTE ) 4 MG gum Take 1 each by mouth as needed for Craving (Nicotine )    omeprazole  (PRILOSEC) 40 MG capsule Take 1 capsule by mouth daily     polyethylene glycol (GLYCOLAX /MIRALAX ) 17 g packet Take 1 packet by mouth daily    spironolactone  (ALDACTONE ) 100 MG tablet Take 1 tablet by mouth daily    sucralfate  (CARAFATE ) 1 GM tablet Take 1 tablet by mouth in the morning and 1 tablet at noon and 1 tablet  in the evening and 1 tablet before bedtime.       Social History:  Social History    Tobacco Use      Smoking status: Former        Packs/day: 0.00        Years: 3.0 packs/day for 30.0 years (90.0 ttl pk-yrs)        Types: Cigarettes        Quit date: 2022        Years since quitting: 4.0        Passive exposure: Past      Smokeless tobacco: Never      Tobacco comments: Pt currently on nicotine  patch and gum    Alcohol  use: Yes      Comment: 2 liters of captain morgan daily      Family History:  Review of patient's family history indicates:  Problem: COPD      Relation: Mother          Age of Onset: (Not Specified)  Problem: Cancer - Lung      Relation: Father          Age of Onset: (Not Specified)          Comment: + smoking history  Problem: Cancer - Lung      Relation: Sister          Age of Onset: 1          Comment: + smoking history  Problem: Diabetes      Relation: Maternal Grandmother          Age of Onset: (Not Specified)  Problem: Cancer - Colon      Relation: Maternal Grandmother          Age of Onset: 59      Allergies:  Review of Patient's Allergies indicates:   Bee venom               Anaphylaxis   Metolazone               Other (See Comments)    Comment:Hypokalemia   Trazodone                Hives   Methylprednisolone      Dizziness, Drowsiness    Comment:Also believes syncope. Seems to tolerate             inhalational and epidural steroid.   Nutritional supplem*       Tegretol [carbamaze*    Hives   Hydroxyzine              Nausea Only    Physical Exam:   GENERAL: No acute distress, well nourished  HEAD: Atraumatic  EYES: No evidence of trauma. Sclerae anicteric.  Pupils equal.  ENT: Mucous Membranes Moist.   LUNGS:  Respirations  unlabored.  HEART: Regular rate & rhythm.   EXTREMITIES/MSK: Left elbow has a large area of abrasion/ulcer/scab with overall dry appearance, a few millimeters of surrounding erythema, mild warmth to palpation, moderate pain to palpation.  She is able to actively flex and extend the joint without issue.  Image below.  SKIN: Warm and dry, no rash  NEUROLOGIC: Alert and oriented x3; moves all extremities without focal defect; speaking in clear fluent sentences.   PSYCHIATRIC: Appropriate, cooperative.      Labs:  Results for orders placed or performed during the hospital encounter of 10/03/24 (from the past 24 hours)   CBC, Platelet & Differential    Collection Time: 10/03/24 11:48 AM  Result Value    WHITE BLOOD CELL COUNT 8.2    RED BLOOD CELL COUNT 4.40    HEMOGLOBIN 13.8    HEMATOCRIT 43.6    MEAN CORPUSCULAR VOL 99.1    MEAN CORPUSCULAR HGB 31.4    MEAN CORP HGB CONC 31.7    RBC DISTRIBUTION WIDTH STD DEV 55.0 (H)    PLATELET COUNT 332    MEAN PLATELET VOLUME 10.8    NEUTROPHIL % 55.4    IMMATURE GRANULOCYTE % 0.5    LYMPHOCYTE % 34.9    MONOCYTE % 6.6    EOSINOPHIL % 2.2    BASOPHIL % 0.4    NRBC % 0.0    ABSOLUTE NEUTROPHIL COUNT 4.5    ABSOLUTE IMM GRAN COUNT 0.04    ABSOLUTE LYMPH COUNT 2.9    ABSOLUTE MONO COUNT 0.5    ABSOLUTE EOSINOPHIL COUNT 0.2    ABSOLUTE BASO COUNT 0.0    ABSOLUTE NRBC COUNT 0.0   Basic Metabolic Panel    Collection Time: 10/03/24 11:48 AM   Result Value    SODIUM 140    POTASSIUM 4.3    CHLORIDE 103    CARBON DIOXIDE 27    ANION GAP 10    CALCIUM  9.7    Glucose Random 84    BUN (UREA NITROGEN) 13    CREATININE 1.1    ESTIMATED GLOMERULAR FILT RATE 60   Hold Green Top Tube    Collection Time: 10/03/24 11:48 AM   Result Value    HOLD GREEN TOP TUBE RECEIVED IN CHEM   Hold Blue Top Tube    Collection Time: 10/03/24 11:48 AM   Result Value    HOLD BLUE TOP TUBE RECEIVED IN HEMATOL       Vital Signs During ED Stay:   10/03/24  1119   BP: 135/80   Pulse: 66   Resp: 18   Temp: 97.2 F    SpO2: 96%   Weight: (!) 147.4 kg (325 lb)       Meds Given during ED visit:  Medications - No data to display    Imaging Results & Interpretation:  No orders to display        ED Course, Medical Decision-making, Disposition:  54 year old female patient presents for left elbow injury/wound:    Today:      Appearance overall stable from prior, 1/8:      Having pain but no fevers.  Wound care nurse is available to evaluate in person and will come to see her.    Seen by wound care nurse who wonders whether local debridement or crosshatching would be appropriate.  Discussed with surgery who recommends orthopedic consultation.  Discussed with orthopedics who recommends primary management by wound care nurse/PCP.   No surgical involvement felt to be indicated.      Plan to initiate santyl  enzymatic debridement.  Wound care nurse got me in touch with the Santyl  rep who informed me that there is a special pharmacy in [place] that we will fill this and deliver to her at no cost.  Order placed.  Patient informed.     Case management spoke with the patient about having VNA coming to her house to help with dressing changes and she refuses.  She states she will manage her own dressing changes.  Will have her follow-up with PCP.    Continue doxycycline , she states that she has another 5 days worth to complete a full weeks course.  Patient was given standardized discharge instructions and return precautions.      New Prescriptions    COLLAGENASE  (SANTYL ) OINTMENT    Apply topically daily    DOXYCYCLINE  (VIBRA -TABS) 100 MG TABLET    Take 1 tablet by mouth in the morning and 1 tablet before bedtime. Do all this for 7 days.       Disposition:  Discharge    Condition:  Stable    Diagnosis/Diagnoses:  Open wound of left elbow, initial encounter    Dorn Mould, MD  10/03/2024  Dept of Emergency Medicine  [facility]    This Emergency Department patient encounter note was created using voice-recognition software and  in real time during the ED visit. Please excuse any typographical errors that have not yet been reviewed and corrected.

## 2024-10-03 NOTE — Narrator Note (Signed)
 Pt reports needing US  guided IV due to tough stick.
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
## 2024-10-03 NOTE — Narrator Note (Signed)
 Commode at bedside, pt able to transfer independently.

## 2024-10-03 NOTE — Discharge Instructions (Addendum)
 Please use the doxycycline  twice a day for the next week.    Our case manager spoke to you about possibly having a visiting nurse come in but you declined.  As such she will need to do dressing changes on your own:    #Topical wound care:   L elbow  Cleanse with Normal Saline, pat dry  Apply 45M Cavilon No Sting Barrier Film (#4009. Fjwq#6654) to periwound skin and areas of adhesive contact  Cover with santyl  ointment nickel thick, cover with saline moist gauze  Wrap with dry gauze and kerlix  Change Daily and PRN.   Date and initial dressing when changed.   (To be changed by nursing staff as ordered, wound nurse will follow 1-2x weekly for follow up assessments while patient remains under our care)

## 2024-10-03 NOTE — Narrator Note (Signed)
 Rewrapped LUE wound per MD order.

## 2024-10-03 NOTE — Narrator Note (Signed)
 Ambulance called for transportation back home, wheelchair bound.

## 2024-10-03 NOTE — Care Coordination (Signed)
 CM consulted for need for VNA SN for dressing changes and wound care. Referrals sent. CM met with pt at bedside to discuss recommendations and options. Pt declines VNA services. Refusing SN states  I can change my own dressing. I don't want a nurse coming to my house. I don't trust them CM will reach out to [facility] care team tomorrow during business hours with update and to request community support and follow up. ED provider notified.

## 2024-10-04 ENCOUNTER — Encounter (HOSPITAL_BASED_OUTPATIENT_CLINIC_OR_DEPARTMENT_OTHER): Payer: Self-pay

## 2024-10-04 ENCOUNTER — Other Ambulatory Visit (HOSPITAL_BASED_OUTPATIENT_CLINIC_OR_DEPARTMENT_OTHER): Payer: Self-pay

## 2024-10-04 MED ORDER — COLLAGENASE 250 UNIT/GM EX OINT: TOPICAL_OINTMENT | Freq: Every day | CUTANEOUS | 5 refills | Status: AC

## 2024-10-04 MED ORDER — COLLAGENASE 250 UNIT/GM EX OINT: TOPICAL_OINTMENT | Freq: Every day | CUTANEOUS | 0 refills | Status: AC

## 2024-10-04 MED ORDER — COLLAGENASE 250 UNIT/GM EX OINT: TOPICAL_OINTMENT | Freq: Every day | CUTANEOUS | 0 refills | Status: DC

## 2024-10-04 NOTE — Telephone Encounter (Addendum)
 ED visit date: 10/03/24    ED diagnosis:  Open wound eval      ED summary:  54 year old female patient presents for evaluation of left elbow wound.  Several prior falls, wheelchair-bound, chronic back pain, gait instability.  Had x-rays without fracture, deep abrasion to the left elbow.  Was seen for a deep elbow abrasion.         Issues for follow up after ED:  Open wound aval     Patient reports the following regarding their current condition (stable, worse, better):  Stable  Patient  reports that she was not able to receive the medication prescribed (Santyl ) Ointment d/t Insurance issue. Requesting for  [facility] [place], [place] and will have her Visiting nurse pick it up.   Dr.  [name] notified, will send New prescription to preferred Pharmacy.                       List any new meds:     COLLAGENASE  (SANTYL ) OINTMENT    Apply topically daily     DOXYCYCLINE  (VIBRA -TABS) 100 MG TABL         Pain/discomfort assessment: Denied pain     Follow up plan: PCP F/U  Scheduled appointment(s) with PCP and or relevant specialist(s): 10/09/24    Other plan of care considerations: VNA      Pt verbalized good understanding and agrees with plan as discussed.       Nolberto Pan, LPN, 8/83/7973

## 2024-10-04 NOTE — ED Notes (Signed)
 Pharmacy called regarding the prescription for the Santyl  ointment.  It appears that this was not sent,/canceled.  I did place this order as it seems that the discharge instructions want her to use this.    Joya JINNY Claude, PA-C

## 2024-10-05 NOTE — Care Coordination (Signed)
 CM made contact with pt's CCA team and updated them with the pt's refusal of VNA services. CM has requested the CCA team have the community RN follow the patient in the community and the team reach out to the patient to encourage participation. CCA agrees to review the case and continue close follow up and support as the patient will allow.

## 2024-10-09 ENCOUNTER — Ambulatory Visit: Admitting: Internal Medicine

## 2024-10-09 DIAGNOSIS — Z5321 Procedure and treatment not carried out due to patient leaving prior to being seen by health care provider: Secondary | ICD-10-CM

## 2024-10-09 NOTE — Progress Notes (Signed)
 Called pt for her scheduled televisit.  Tells me I want you to stop bugging me.  I am very busy right now, I have other things to do, I don't want to be bothered.  Pt then hang up.

## 2024-10-11 ENCOUNTER — Encounter (HOSPITAL_BASED_OUTPATIENT_CLINIC_OR_DEPARTMENT_OTHER): Payer: Self-pay

## 2024-10-12 ENCOUNTER — Other Ambulatory Visit (HOSPITAL_BASED_OUTPATIENT_CLINIC_OR_DEPARTMENT_OTHER): Payer: Self-pay | Admitting: Internal Medicine

## 2024-10-12 ENCOUNTER — Other Ambulatory Visit (HOSPITAL_BASED_OUTPATIENT_CLINIC_OR_DEPARTMENT_OTHER): Payer: Self-pay | Admitting: Physician Assistant

## 2024-10-12 DIAGNOSIS — G894 Chronic pain syndrome: Secondary | ICD-10-CM

## 2024-10-12 DIAGNOSIS — F251 Schizoaffective disorder, depressive type: Secondary | ICD-10-CM

## 2024-10-12 NOTE — Telephone Encounter (Signed)
 PER who is calling: Patient (self), Cheryl Zimmerman is a 54 year old female has requested a refill of QUEtiapine  .    Last Office Visit: 10/09/24     Other Med Adult:  Most Recent BP Reading(s)  10/03/24 : 135/80        Cholesterol (mg/dL)   Date Value   92/76/7976 263 (H)     LOW DENSITY LIPOPROTEIN DIRECT (mg/dL)   Date Value   92/76/7976 192 (H)     HIGH DENSITY LIPOPROTEIN (mg/dL)   Date Value   92/76/7976 40     TRIGLYCERIDES (mg/dL)   Date Value   92/76/7976 301 (H)         THYROID  SCREEN TSH REFLEX FT4 (uIU/mL)   Date Value   05/14/2024 2.040         TSH (THYROID  STIM HORMONE) (uIU/mL)   Date Value   09/25/2024 1.330       HEMOGLOBIN A1C (%)   Date Value   07/15/2022 6.5 (H)     HEMOGLOBIN A1C CARE EVERYWHERE (%)   Date Value   12/25/2023 5.8 (H)       No results found for: POCA1C      INR (no units)   Date Value   06/02/2011 1.0 (L)   06/02/2011 < 1.0 (*L)     INR CARE EVERYWHERE (no units)   Date Value   10/16/2021 1.0   09/23/2021 0.9   01/06/2021 1.1   11/11/2020 1.0   07/05/2020 0.9   07/04/2020 0.8 (L)   03/13/2020 1.0       SODIUM (mmol/L)   Date Value   10/03/2024 140       POTASSIUM (mmol/L)   Date Value   10/03/2024 4.3           CREATININE (mg/dL)   Date Value   98/84/7973 1.1       Documented patient preferred pharmacies:    [id number] [place] [place] - [address]  Phone: [phone] Fax: [phone]

## 2024-10-12 NOTE — Telephone Encounter (Signed)
 PER who is calling: Patient (self), Cheryl Zimmerman is a 54 year old female has requested a refill of gabapentin  .baclofen        Other Med Adult:  Most Recent BP Reading(s)  10/03/24 : 135/80        Cholesterol (mg/dL)   Date Value   92/76/7976 263 (H)     LOW DENSITY LIPOPROTEIN DIRECT (mg/dL)   Date Value   92/76/7976 192 (H)     HIGH DENSITY LIPOPROTEIN (mg/dL)   Date Value   92/76/7976 40     TRIGLYCERIDES (mg/dL)   Date Value   92/76/7976 301 (H)         THYROID  SCREEN TSH REFLEX FT4 (uIU/mL)   Date Value   05/14/2024 2.040         TSH (THYROID  STIM HORMONE) (uIU/mL)   Date Value   09/25/2024 1.330       HEMOGLOBIN A1C (%)   Date Value   07/15/2022 6.5 (H)     HEMOGLOBIN A1C CARE EVERYWHERE (%)   Date Value   12/25/2023 5.8 (H)       No results found for: POCA1C      INR (no units)   Date Value   06/02/2011 1.0 (L)   06/02/2011 < 1.0 (*L)     INR CARE EVERYWHERE (no units)   Date Value   10/16/2021 1.0   09/23/2021 0.9   01/06/2021 1.1   11/11/2020 1.0   07/05/2020 0.9   07/04/2020 0.8 (L)   03/13/2020 1.0       SODIUM (mmol/L)   Date Value   10/03/2024 140       POTASSIUM (mmol/L)   Date Value   10/03/2024 4.3           CREATININE (mg/dL)   Date Value   98/84/7973 1.1       Documented patient preferred pharmacies:    [id number] [place], [place] - [address]  Phone: [phone] Fax: [phone]

## 2024-10-20 ENCOUNTER — Other Ambulatory Visit (HOSPITAL_BASED_OUTPATIENT_CLINIC_OR_DEPARTMENT_OTHER): Payer: Self-pay | Admitting: Physician Assistant

## 2024-10-20 ENCOUNTER — Other Ambulatory Visit (HOSPITAL_BASED_OUTPATIENT_CLINIC_OR_DEPARTMENT_OTHER): Payer: Self-pay | Admitting: Internal Medicine

## 2024-10-20 DIAGNOSIS — I509 Heart failure, unspecified: Secondary | ICD-10-CM

## 2024-10-20 DIAGNOSIS — G894 Chronic pain syndrome: Secondary | ICD-10-CM

## 2024-10-20 DIAGNOSIS — F251 Schizoaffective disorder, depressive type: Secondary | ICD-10-CM

## 2024-10-20 DIAGNOSIS — F1120 Opioid dependence, uncomplicated: Secondary | ICD-10-CM

## 2024-10-20 NOTE — Telephone Encounter (Signed)
 PER who is calling: Pharmacy, Cheryl Zimmerman is a 54 year old female has requested a refill of        bumetanide  (BUMEX )     busPIRone  (BUSPAR )     buprenorphine -naloxone  (SUBOXONE )         Only complete for controlled medication:    buprenorphine -naloxone  (SUBOXONE )   Start Date 08/23/2024          End Date   09/22/2024             Last Office Visit  : 10/03/2024 [name], MD      Last Tele Visit:  10/09/2024 [name], MD      Last Physical Exam:  Not on file.    There are no preventive care reminders to display for this patient.    Other Med Adult:  Most Recent BP Reading(s)  10/03/24 : 135/80        Cholesterol (mg/dL)   Date Value   92/76/7976 263 (H)     LOW DENSITY LIPOPROTEIN DIRECT (mg/dL)   Date Value   92/76/7976 192 (H)     HIGH DENSITY LIPOPROTEIN (mg/dL)   Date Value   92/76/7976 40     TRIGLYCERIDES (mg/dL)   Date Value   92/76/7976 301 (H)         THYROID  SCREEN TSH REFLEX FT4 (uIU/mL)   Date Value   05/14/2024 2.040         TSH (THYROID  STIM HORMONE) (uIU/mL)   Date Value   09/25/2024 1.330       HEMOGLOBIN A1C (%)   Date Value   07/15/2022 6.5 (H)     HEMOGLOBIN A1C CARE EVERYWHERE (%)   Date Value   12/25/2023 5.8 (H)       No results found for: POCA1C      INR (no units)   Date Value   06/02/2011 1.0 (L)   06/02/2011 < 1.0 (*L)     INR CARE EVERYWHERE (no units)   Date Value   10/16/2021 1.0   09/23/2021 0.9   01/06/2021 1.1   11/11/2020 1.0   07/05/2020 0.9   07/04/2020 0.8 (L)   03/13/2020 1.0       SODIUM (mmol/L)   Date Value   10/03/2024 140       POTASSIUM (mmol/L)   Date Value   10/03/2024 4.3           CREATININE (mg/dL)   Date Value   98/84/7973 1.1       Documented patient preferred pharmacies:    [id number], [place] - [address]  Phone: [phone] Fax: [phone]

## 2024-10-20 NOTE — Telephone Encounter (Signed)
 PER who is calling: Pharmacy, Cheryl Zimmerman is a 54 year old female has requested a refill of         tirzepatide -Weight Management (ZEPBOUND ) .      Last Office Visit  : 10/03/2024 [name], MD      Last Tele Visit:  10/09/2024 [name], MD      Last Physical Exam:  Not on file.    There are no preventive care reminders to display for this patient.    Other Med Adult:  Most Recent BP Reading(s)  10/03/24 : 135/80        Cholesterol (mg/dL)   Date Value   92/76/7976 263 (H)     LOW DENSITY LIPOPROTEIN DIRECT (mg/dL)   Date Value   92/76/7976 192 (H)     HIGH DENSITY LIPOPROTEIN (mg/dL)   Date Value   92/76/7976 40     TRIGLYCERIDES (mg/dL)   Date Value   92/76/7976 301 (H)         THYROID  SCREEN TSH REFLEX FT4 (uIU/mL)   Date Value   05/14/2024 2.040         TSH (THYROID  STIM HORMONE) (uIU/mL)   Date Value   09/25/2024 1.330       HEMOGLOBIN A1C (%)   Date Value   07/15/2022 6.5 (H)     HEMOGLOBIN A1C CARE EVERYWHERE (%)   Date Value   12/25/2023 5.8 (H)       No results found for: POCA1C      INR (no units)   Date Value   06/02/2011 1.0 (L)   06/02/2011 < 1.0 (*L)     INR CARE EVERYWHERE (no units)   Date Value   10/16/2021 1.0   09/23/2021 0.9   01/06/2021 1.1   11/11/2020 1.0   07/05/2020 0.9   07/04/2020 0.8 (L)   03/13/2020 1.0       SODIUM (mmol/L)   Date Value   10/03/2024 140       POTASSIUM (mmol/L)   Date Value   10/03/2024 4.3           CREATININE (mg/dL)   Date Value   98/84/7973 1.1       Documented patient preferred pharmacies:    [id number] [place], [place] - [address]  Phone: [phone] Fax: [phone]
